# Patient Record
Sex: Male | Born: 1965
Health system: Southern US, Community
[De-identification: ages and names within clinical notes are randomized; demographics above are authoritative.]

## PROBLEM LIST (undated history)

## (undated) DIAGNOSIS — E785 Hyperlipidemia, unspecified: Secondary | ICD-10-CM

## (undated) DIAGNOSIS — K2970 Gastritis, unspecified, without bleeding: Secondary | ICD-10-CM

## (undated) DIAGNOSIS — N289 Disorder of kidney and ureter, unspecified: Secondary | ICD-10-CM

## (undated) DIAGNOSIS — K2211 Ulcer of esophagus with bleeding: Secondary | ICD-10-CM

## (undated) DIAGNOSIS — N1330 Unspecified hydronephrosis: Secondary | ICD-10-CM

## (undated) DIAGNOSIS — L8990 Pressure ulcer of unspecified site, unspecified stage: Secondary | ICD-10-CM

## (undated) DIAGNOSIS — N200 Calculus of kidney: Secondary | ICD-10-CM

## (undated) DIAGNOSIS — Q059 Spina bifida, unspecified: Secondary | ICD-10-CM

## (undated) DIAGNOSIS — K5649 Other impaction of intestine: Secondary | ICD-10-CM

## (undated) HISTORY — PX: APPENDECTOMY: SHX54

## (undated) HISTORY — PX: PERCUTANEOUS NEPHROSTOLITHOTOMY: SHX2207

## (undated) HISTORY — DX: Other impaction of intestine: K56.49

## (undated) HISTORY — PX: COLON SURGERY: SHX602

## (undated) HISTORY — DX: Hyperlipidemia, unspecified: E78.5

## (undated) HISTORY — PX: OTHER SURGICAL HISTORY: SHX169

## (undated) HISTORY — PX: ABDOMINAL SURGERY: SHX537

## (undated) HISTORY — DX: Calculus of kidney: N20.0

## (undated) HISTORY — DX: Unspecified hydronephrosis: N13.30

## (undated) HISTORY — DX: Ulcer of esophagus with bleeding: K22.11

## (undated) HISTORY — PX: BACK SURGERY: SHX140

## (undated) HISTORY — PX: PERCUTANEOUS NEPHROSTOMY: SHX2208

## (undated) HISTORY — DX: Gastritis, unspecified, without bleeding: K29.70

## (undated) HISTORY — PX: VENTRICULOPERITONEAL SHUNT: SHX204

---

## 2008-07-24 ENCOUNTER — Inpatient Hospital Stay: Admission: RE | Admit: 2008-07-24 | Discharge: 2008-08-02 | Payer: Self-pay | Admitting: Internal Medicine

## 2008-08-05 ENCOUNTER — Ambulatory Visit: Payer: Self-pay

## 2008-08-08 ENCOUNTER — Ambulatory Visit: Payer: Self-pay

## 2011-07-06 LAB — DIFFERENTIAL
Basophils Relative: 0
Basophils Relative: 3 — ABNORMAL HIGH
Eosinophils Absolute: 0.4
Eosinophils Absolute: 0.4
Eosinophils Relative: 6 — ABNORMAL HIGH
Eosinophils Relative: 8 — ABNORMAL HIGH
Lymphocytes Relative: 22
Lymphs Abs: 1.3
Lymphs Abs: 1.5
Lymphs Abs: 3.1
Monocytes Absolute: 0.6
Monocytes Relative: 10
Monocytes Relative: 8
Monocytes Relative: 9
Neutro Abs: 2
Neutrophils Relative %: 34 — ABNORMAL LOW
Neutrophils Relative %: 61

## 2011-07-06 LAB — CBC
HCT: 34.7 — ABNORMAL LOW
HCT: 39.4
Hemoglobin: 11.5 — ABNORMAL LOW
MCHC: 32.4
MCHC: 33.1
MCV: 85.6
MCV: 86.7
Platelets: 432 — ABNORMAL HIGH
Platelets: 470 — ABNORMAL HIGH
Platelets: 558 — ABNORMAL HIGH
RBC: 2.65 — ABNORMAL LOW
RBC: 4.06 — ABNORMAL LOW
WBC: 5.8
WBC: 5.8

## 2011-07-06 LAB — IRON AND TIBC
Iron: 26 — ABNORMAL LOW
Saturation Ratios: 16 — ABNORMAL LOW
TIBC: 167 — ABNORMAL LOW

## 2011-07-06 LAB — BASIC METABOLIC PANEL
BUN: 7
CO2: 29
Calcium: 8.4
Chloride: 102
Creatinine, Ser: 0.62
Creatinine, Ser: 0.62
GFR calc Af Amer: >60
Glucose, Bld: 92
Potassium: 4.5

## 2011-07-06 LAB — CROSSMATCH

## 2011-07-06 LAB — ABO/RH: ABO/RH(D): A POS

## 2011-07-06 LAB — OCCULT BLOOD X 1 CARD TO LAB, STOOL: Fecal Occult Bld: POSITIVE

## 2014-06-14 DIAGNOSIS — K5909 Other constipation: Secondary | ICD-10-CM | POA: Insufficient documentation

## 2014-06-29 ENCOUNTER — Emergency Department: Payer: Self-pay | Admitting: Emergency Medicine

## 2014-11-29 DIAGNOSIS — N99528 Other complication of other external stoma of urinary tract: Secondary | ICD-10-CM | POA: Insufficient documentation

## 2014-12-17 ENCOUNTER — Emergency Department: Payer: Self-pay | Admitting: Emergency Medicine

## 2014-12-21 DIAGNOSIS — R7881 Bacteremia: Secondary | ICD-10-CM | POA: Insufficient documentation

## 2014-12-21 DIAGNOSIS — B952 Enterococcus as the cause of diseases classified elsewhere: Secondary | ICD-10-CM | POA: Insufficient documentation

## 2014-12-26 DIAGNOSIS — A0472 Enterocolitis due to Clostridium difficile, not specified as recurrent: Secondary | ICD-10-CM | POA: Insufficient documentation

## 2015-03-11 DIAGNOSIS — N319 Neuromuscular dysfunction of bladder, unspecified: Secondary | ICD-10-CM | POA: Insufficient documentation

## 2015-03-26 ENCOUNTER — Ambulatory Visit: Payer: Self-pay | Admitting: Surgery

## 2015-03-27 ENCOUNTER — Encounter: Payer: Medicare Other | Attending: Surgery | Admitting: Surgery

## 2015-03-27 DIAGNOSIS — L89324 Pressure ulcer of left buttock, stage 4: Secondary | ICD-10-CM | POA: Diagnosis not present

## 2015-03-27 DIAGNOSIS — Q059 Spina bifida, unspecified: Secondary | ICD-10-CM | POA: Insufficient documentation

## 2015-03-27 DIAGNOSIS — G8221 Paraplegia, complete: Secondary | ICD-10-CM | POA: Insufficient documentation

## 2015-03-28 NOTE — Progress Notes (Addendum)
DREQUAN, IRONSIDE ([id number]) Visit Report for 03/27/2015 Chief Complaint Document Details Patient Name: [name], [name] Date of Service: 03/27/2015 1:45 PM Medical Record Number: [id number] Patient Account Number: [id number] Date of Birth/Sex: 1966-01-12 (49 y.o. Male) Treating RN: Primary Care Physician: Other Clinician: Referring Physician: Treating Physician/Extender: Frann Rider in Treatment: 0 Information Obtained from: Patient Chief Complaint Patient is at the clinic for treatment of an open pressure ulcer. he has had this problem for several weeks and he says he has not seen anyone to last week where he went to the [facility] ED. Electronic Signature(s) Signed: 03/27/2015 5:00:54 PM By: Christin Fudge MD, FACS Entered By: Christin Fudge on 03/27/2015 14:51:16 [name] ([id number]) -------------------------------------------------------------------------------- Debridement Details Patient Name: [name] Date of Service: 03/27/2015 1:45 PM Medical Record Number: [id number] Patient Account Number: [id number] Date of Birth/Sex: 05-30-1966 (49 y.o. Male) Treating RN: Primary Care Physician: Other Clinician: Referring Physician: Treating Physician/Extender: Frann Rider in Treatment: 0 Debridement Performed for Wound #1 Left Upper Leg Assessment: Performed By: Physician Pat Patrick., MD Debridement: Debridement Pre-procedure Yes Verification/Time Out Taken: Start Time: 02:35 Pain Control: Lidocaine 5% topical ointment Level: Skin/Subcutaneous Tissue/Muscle Total Area Debrided (L x 4 (cm) x 3 (cm) = 12 (cm) W): Tissue and other Viable, Non-Viable, Fibrin/Slough, Muscle, Skin, Subcutaneous material debrided: Instrument: Forceps, Scissors Bleeding: Minimum Hemostasis Achieved: Pressure End Time: 02:40 Procedural Pain: 0 Post Procedural Pain: 0 Response to Treatment: Procedure was tolerated well Post Debridement Measurements of Total  Wound Length: (cm) 4.5 Stage: Category/Stage IV Width: (cm) 4.5 Depth: (cm) 3 Volume: (cm) 47.713 Electronic Signature(s) Signed: 03/27/2015 5:00:54 PM By: Christin Fudge MD, FACS Entered By: Christin Fudge on 03/27/2015 14:50:17 [name] ([id number]) -------------------------------------------------------------------------------- HPI Details Patient Name: [name] Date of Service: 03/27/2015 1:45 PM Medical Record Number: [id number] Patient Account Number: [id number] Date of Birth/Sex: 03/22/1966 (49 y.o. Male) Treating RN: Primary Care Physician: Other Clinician: Referring Physician: Treating Physician/Extender: Frann Rider in Treatment: 0 History of Present Illness Location: open ulcer on the left gluteal region near his thigh Quality: Patient reports No Pain. Severity: Patient states wound are getting worse. Duration: Patient has had the wound for > 3 months prior to seeking treatment at the wound center Context: The wound appeared gradually over time Modifying Factors: Consults to this date include:doxycycline which was prescribed by the ER physician at [place] Signs and Symptoms: Patient reports having fever. HPI Description: 49 year old male with a history of spina bifida presenting to [place] with a history of a open wound on the left gluteal region near his upper thigh which she's had for several weeks. He was seen in the ED recently at [facility] and was put on doxycycline and was advised some local care. his past medical history is significant for spinal bifid, neurogenic bladder, kidney stones, sacral pressure sores, constipation. Past surgical history significant for partial cystectomy, ileal conduit, VP shunt removal, percutaneous nephro lithotripsy. In the remote past the patient says he's had some treatment for a ulcer on this area and was treated with a skin graft. Electronic Signature(s) Signed: 03/27/2015 5:00:54 PM By:  Christin Fudge MD, FACS Entered By: Christin Fudge on 03/27/2015 14:55:19 [name] ([id number]) -------------------------------------------------------------------------------- Physical Exam Details Patient Name: [name] Date of Service: 03/27/2015 1:45 PM Medical Record Number: [id number] Patient Account Number: [id number] Date of Birth/Sex: 09/02/1966 (49 y.o. Male) Treating RN: Primary Care Physician: Other Clinician: Referring Physician: Treating Physician/Extender: Frann Rider in Treatment: 0 Constitutional . Pulse regular. Respirations normal and  unlabored. Afebrile. . Eyes Nonicteric. Reactive to light. Ears, Nose, Mouth, and Throat Lips, teeth, and gums WNL.Marland Kitchen Moist mucosa without lesions . Neck supple and nontender. No palpable supraclavicular or cervical adenopathy. Normal sized without goiter. Respiratory WNL. No retractions.. Cardiovascular Pedal Pulses WNL. No clubbing, cyanosis or edema. Integumentary (Hair, Skin) He has a large stage IV pressure ulcer on the left gluteal region near his upper thigh and close to his anus. This is down to the muscle and has some slough between the 12 and 3:00 position and some slough off his muscles too.Marland Kitchen No crepitus or fluctuance. No peri-wound warmth or erythema. No masses.Marland Kitchen Psychiatric Judgement and insight Intact.. No evidence of depression, anxiety, or agitation.. Electronic Signature(s) Signed: 03/27/2015 5:00:54 PM By: Christin Fudge MD, FACS Entered By: Christin Fudge on 03/27/2015 14:57:53 [name] ([id number]) -------------------------------------------------------------------------------- Physician Orders Details Patient Name: [name] Date of Service: 03/27/2015 1:45 PM Medical Record Number: [id number] Patient Account Number: [id number] Date of Birth/Sex: [birth year] (49 y.o. Male) Treating RN: Baruch Gouty, RN, BSN, Velva Harman Primary Care Physician: Other Clinician: Referring  Physician: Treating Physician/Extender: Frann Rider in Treatment: 0 Verbal / Phone Orders: Yes Clinician: Afful, RN, BSN, Rita Read Back and Verified: Yes Diagnosis Coding Wound Cleansing Wound #1 Left Upper Leg o Clean wound with Normal Saline. o May Shower, gently pat wound dry prior to applying new dressing. Anesthetic Wound #1 Left Upper Leg o Topical Lidocaine 4% cream applied to wound bed prior to debridement Skin Barriers/Peri-Wound Care Wound #1 Left Upper Leg o Skin Prep Primary Wound Dressing Wound #1 Left Upper Leg o Santyl Ointment Secondary Dressing Wound #1 Left Upper Leg o Dry Gauze o Boardered Foam Dressing Dressing Change Frequency Wound #1 Left Upper Leg o Change dressing every day. Follow-up Appointments Wound #1 Left Upper Leg o Return Appointment in 1 week. Off-Loading Wound #1 Left Upper Leg o Turn and reposition every 2 hours o [place], [place] ([id number]) Home Health Wound #1 Left Upper Leg o [facility] - Please resume home health visit for wound care. Please order wound vac for patient o Home Health Nurse may visit PRN to address patientos wound care needs. o FACE TO FACE ENCOUNTER: MEDICARE and MEDICAID PATIENTS: I certify that this patient is under my care and that I had a face-to-face encounter that meets the physician face-to-face encounter requirements with this patient on this date. The encounter with the patient was in whole or in part for the following MEDICAL CONDITION: (primary reason for [place]) MEDICAL NECESSITY: I certify, that based on my findings, NURSING services are a medically necessary home health service. HOME BOUND STATUS: I certify that my clinical findings support that this patient is homebound (i.e., Due to illness or injury, pt requires aid of supportive devices such as crutches, cane, wheelchairs, walkers, the use of  special transportation or the assistance of another person to leave their place of residence. There is a normal inability to leave the home and doing so requires considerable and taxing effort. Other absences are for medical reasons / religious services and are infrequent or of short duration when for other reasons). o If current dressing causes regression in wound condition, may D/C ordered dressing product/s and apply Normal Saline Moist Dressing daily until next [place] / Other MD appointment. Magazine of regression in wound condition at [phone]. o Please direct any NON-WOUND related issues/requests for orders to patient's Primary Care Physician Medications-please add to medication list.  Wound #1 Left Upper Leg o Santyl Enzymatic Ointment - Apply to wound bed once a day. o Other: - Patient to continue taking ABT prescribed by Cydney Ok MD. Patient Medications Allergies: sulfur, latex, morphine, iv contrast dye Notifications Medication Indication Start End Santyl wound 03/27/2015 DOSE topical 250 unit/gram ointment - ointment topical Electronic Signature(s) Signed: 03/27/2015 5:00:54 PM By: Christin Fudge MD, FACS Signed: 03/27/2015 5:53:15 PM By: Regan Lemming BSN, RN Previous Signature: 03/27/2015 2:49:56 PM Version By: Regan Lemming BSN, RN Entered By: Regan Lemming on 03/27/2015 16:33:11 KAIDE, GAGE ([id number]) -------------------------------------------------------------------------------- Prescription 03/27/2015 Patient Name: [name] Physician: Christin Fudge MD Date of Birth: 1966-07-05 NPI#: [id number] Sex: Jerilynn Mages DEA#: [id number] Phone #: [phone] License #: Patient Address: [place], [place] [address] [facility] [address], [place], [place] [address] [phone] Allergies sulfur latex morphine iv contrast  dye Physician's Orders Santyl Enzymatic Ointment - Apply to wound bed once a day. Signature(s): Date(s): MONTEY, EBEL ([id number]) Prescription 03/27/2015 Patient Name: [name] Physician: Christin Fudge MD Date of Birth: [birth year] NPI#: [id number] Sex: Valeta Harms: [id number] Phone #: [phone] License #: Patient Address: [place], [place] [address] [facility] [address], [place], [place] [address] [phone] Allergies sulfur latex morphine iv contrast dye Medication Medication: Route: Strength: Form: Santyl topical 250 unit/gram ointment Class: TOPICAL/MUCOUS MEMBR./SUBCUT. ENZYMES Dose: Frequency / Time: Indication: ointment topical wound Number of Refills: Number of Units: 1 Generic Substitution: Start Date: End Date: Administered at Substitution Permitted 02/04/5464 Facility: No Note to Pharmacy: Signature(s): Date(s): HOMMER, CUNLIFFE ([id number]) Electronic Signature(s) Signed: 03/27/2015 5:00:54 PM By: Christin Fudge MD, FACS Signed: 03/27/2015 5:53:15 PM By: Regan Lemming BSN, RN Entered By: Regan Lemming on 03/27/2015 16:33:12 [name] ([id number]) --------------------------------------------------------------------------------  Problem List Details Patient Name: [name] Date of Service: 03/27/2015 1:45 PM Medical Record Number: [id number] Patient Account Number: [id number] Date of Birth/Sex: 1966/05/17 (49 y.o. Male) Treating RN: Primary Care Physician: Other Clinician: Referring Physician: Treating Physician/Extender: Frann Rider in Treatment: 0 Active Problems ICD-10 Encounter Code Description Active Date Diagnosis Q05.9 Spina bifida, unspecified 03/27/2015 Yes G82.21 Paraplegia, complete 03/27/2015 Yes L89.324 Pressure ulcer of left buttock, stage 4 03/27/2015 Yes Inactive Problems Resolved  Problems Electronic Signature(s) Signed: 03/27/2015 5:00:54 PM By: Christin Fudge MD, FACS Entered By: Christin Fudge on 03/27/2015 14:48:49 [name] ([id number]) -------------------------------------------------------------------------------- Progress Note Details Patient Name: [name] Date of Service: 03/27/2015 1:45 PM Medical Record Number: [id number] Patient Account Number: [id number] Date of Birth/Sex: 04/06/1966 (49 y.o. Male) Treating RN: Primary Care Physician: Other Clinician: Referring Physician: Treating Physician/Extender: Frann Rider in Treatment: 0 Subjective Chief Complaint Information obtained from Patient Patient is at the clinic for treatment of an open pressure ulcer. he has had this problem for several weeks and he says he has not seen anyone to last week where he went to the [facility] ED. History of Present Illness (HPI) The following HPI elements were documented for the patient's wound: Location: open ulcer on the left gluteal region near his thigh Quality: Patient reports No Pain. Severity: Patient states wound are getting worse. Duration: Patient has had the wound for > 3 months prior to seeking treatment at the wound center Context: The wound appeared gradually over time Modifying Factors: Consults to this date include:doxycycline which was prescribed by the ER physician at [place] Signs and Symptoms: Patient reports having fever. 49 year old  male with a history of spina bifida presenting to [place] with a history of a open wound on the left gluteal region near his upper thigh which she's had for several weeks. He was seen in the ED recently at [facility] and was put on doxycycline and was advised some local care. his past medical history is significant for spinal bifid, neurogenic bladder, kidney stones, sacral pressure sores, constipation. Past surgical history significant for partial cystectomy, ileal conduit, VP shunt  removal, percutaneous nephro lithotripsy. In the remote past the patient says he's had some treatment for a ulcer on this area and was treated with a skin graft. Wound History Patient presents with 1 open wound that has been present for approximately year. Patient has been treating wound in the following manner: dry dressing. The wound has been healed in the past but has re-opened. Laboratory tests have not been performed in the last month. Patient reportedly has tested positive for an antibiotic resistant organism. Patient reportedly has not tested positive for osteomyelitis. Patient reportedly has not had testing performed to evaluate circulation in the legs. Patient History Information obtained from Patient. Allergies sulfur, latex, morphine, iv contrast dye GILLIE, CRISCI ([id number]) Family History Cancer - Paternal Grandparents, Diabetes - Father, Mother, Hypertension - Father, Mother, Lung Disease - Mother, Stroke - Mother, No family history of Heart Disease, Kidney Disease, Seizures, Thyroid Problems, Tuberculosis. Social History Never smoker, Marital Status - Single, Alcohol Use - Never, Drug Use - No History. Medical History Eyes Denies history of Cataracts, Glaucoma, Optic Neuritis Ear/Nose/Mouth/Throat Denies history of Chronic sinus problems/congestion, Middle ear problems Hematologic/Lymphatic Denies history of Anemia, Hemophilia, Human Immunodeficiency Virus, Lymphedema, Sickle Cell Disease Respiratory Denies history of Aspiration, Asthma, Chronic Obstructive Pulmonary Disease (COPD), Pneumothorax, Sleep Apnea, Tuberculosis Cardiovascular Denies history of Angina, Arrhythmia, Congestive Heart Failure, Coronary Artery Disease, Deep Vein Thrombosis, Hypertension, Hypotension, Myocardial Infarction, Peripheral Arterial Disease, Peripheral Venous Disease, Phlebitis, Vasculitis Endocrine Denies history of Type I Diabetes Immunological Denies history of Lupus  Erythematosus, Raynaud s, Scleroderma Integumentary (Skin) Patient has history of History of pressure wounds Oncologic Denies history of Received Chemotherapy, Received Radiation Psychiatric Denies history of Anorexia/bulimia, Confinement Anxiety Medical And Surgical History Notes Musculoskeletal spina bifida Neurologic Spida Bifida Review of Systems (ROS) Constitutional Symptoms (General Health) The patient has no complaints or symptoms. Eyes Complains or has symptoms of Glasses / Contacts. Ear/Nose/Mouth/Throat The patient has no complaints or symptoms. Hematologic/Lymphatic The patient has no complaints or symptoms. Respiratory MISSAEL, FERRARI ([id number]) The patient has no complaints or symptoms. Cardiovascular The patient has no complaints or symptoms. Gastrointestinal needs to take miralax for bowel movement Endocrine The patient has no complaints or symptoms. Genitourinary Complains or has symptoms of Incontinence/dribbling - urostomy. Immunological The patient has no complaints or symptoms. Integumentary (Skin) Complains or has symptoms of Wounds, Breakdown. Musculoskeletal Complains or has symptoms of Muscle Weakness. Neurologic The patient has no complaints or symptoms. Oncologic The patient has no complaints or symptoms. Psychiatric The patient has no complaints or symptoms. medication list: I reviewed her list of his medications which include Colace, Vibramycin, vitamin supplements, oxycodone, GlycoLax, senna,and fleets enema. Objective Constitutional Pulse regular. Respirations normal and unlabored. Afebrile. Vitals Time Taken: 2:00 PM. Eyes Nonicteric. Reactive to light. Ears, Nose, Mouth, and Throat Lips, teeth, and gums WNL.Marland Kitchen Moist mucosa without lesions . Neck supple and nontender. No palpable supraclavicular or cervical adenopathy. Normal sized without goiter. [place], [name] ([id number]) Respiratory WNL. No  retractions.. Cardiovascular Pedal Pulses WNL. No clubbing, cyanosis or  edema. Psychiatric Judgement and insight Intact.. No evidence of depression, anxiety, or agitation.. Integumentary (Hair, Skin) He has a large stage IV pressure ulcer on the left gluteal region near his upper thigh and close to his anus. This is down to the muscle and has some slough between the 12 and 3:00 position and some slough off his muscles too.Marland Kitchen No crepitus or fluctuance. No peri-wound warmth or erythema. No masses.. Wound #1 status is Open. Original cause of wound was Gradually Appeared. The wound is located on the Left,Posterior Upper Leg. The wound measures 4.5cm length x 4.5cm width x 3cm depth; 15.904cm^2 area and 47.713cm^3 volume. The wound is limited to skin breakdown. There is no tunneling or undermining noted. There is a large amount of serosanguineous drainage noted. The wound margin is distinct with the outline attached to the wound base. There is medium (34-66%) granulation within the wound bed. There is a small (1-33%) amount of necrotic tissue within the wound bed including Adherent Slough. The periwound skin appearance exhibited: Moist. The periwound skin appearance did not exhibit: Callus, Crepitus, Excoriation, Fluctuance, Friable, Induration, Localized Edema, Rash, Scarring, Dry/Scaly, Maceration, Atrophie Blanche, Cyanosis, Ecchymosis, Hemosiderin Staining, Mottled, Pallor, Rubor, Erythema. Periwound temperature was noted as No Abnormality. Assessment Active Problems ICD-10 Q05.9 - Spina bifida, unspecified G82.21 - Paraplegia, complete L89.324 - Pressure ulcer of left buttock, stage 4 This gentleman needs a lot of off loading and we have discussed this with him in great detail. We will also look into his mattress and our team will call and find out what type of mattress he has and also the cushion on his wheelchair. [place], [name] ([id number]) he is already on doxycycline and some not  taking any fresh wound cultures. We will use Santyl for some of the area which could not be debrided sharply and apply a dressing today which will consist of Kerlix packing. I have recommended a negative pressure wound vacuum system so as to have growth of some granulation tissue and then he would need a plastic surgery opinion regarding possible muscle flap reconstruction. He prefers to go to [facility] for this. We will monitor him for a while and then when his wound gets cleaner referred him appropriately. He will come back and see me next week. Procedures Wound #1 Wound #1 is a Pressure Ulcer located on the Left Upper Leg . There was a Skin/Subcutaneous Tissue/Muscle Debridement ([id number]) debridement with total area of 12 sq cm performed by Vashaun Osmon, Jackson Latino., MD. with the following instrument(s): Forceps and Scissors to remove Viable and Non-Viable tissue/material including Fibrin/Slough, Muscle, Skin, and Subcutaneous after achieving pain control using Lidocaine 5% topical ointment. A time out was conducted prior to the start of the procedure. A Minimum amount of bleeding was controlled with Pressure. The procedure was tolerated well with a pain level of 0 throughout and a pain level of 0 following the procedure. Post Debridement Measurements: 4.5cm length x 4.5cm width x 3cm depth; 47.713cm^3 volume. Post debridement Stage noted as Category/Stage IV. Plan Wound Cleansing: Wound #1 Left Upper Leg: Clean wound with Normal Saline. May Shower, gently pat wound dry prior to applying new dressing. Anesthetic: Wound #1 Left Upper Leg: Topical Lidocaine 4% cream applied to wound bed prior to debridement Skin Barriers/Peri-Wound Care: Wound #1 Left Upper Leg: Skin Prep Primary Wound Dressing: Wound #1 Left Upper Leg: Santyl Ointment Secondary Dressing: Wound #1 Left Upper Leg: Dry Gauze Boardered Foam Dressing Dressing Change Frequency: Wound #1 Left Upper Leg: [name]  (  [id number]) Change dressing every day. Follow-up Appointments: Wound #1 Left Upper Leg: Return Appointment in 1 week. Off-Loading: Wound #1 Left Upper Leg: Turn and reposition every 2 hours [facility]ress Home Health: Wound #1 Left Upper Leg: [facility] - Please resume home health visit for wound care. Please order wound vac for patient Home Health Nurse may visit PRN to address patient s wound care needs. FACE TO FACE ENCOUNTER: MEDICARE and MEDICAID PATIENTS: I certify that this patient is under my care and that I had a face-to-face encounter that meets the physician face-to-face encounter requirements with this patient on this date. The encounter with the patient was in whole or in part for the following MEDICAL CONDITION: (primary reason for [place]) MEDICAL NECESSITY: I certify, that based on my findings, NURSING services are a medically necessary home health service. HOME BOUND STATUS: I certify that my clinical findings support that this patient is homebound (i.e., Due to illness or injury, pt requires aid of supportive devices such as crutches, cane, wheelchairs, walkers, the use of special transportation or the assistance of another person to leave their place of residence. There is a normal inability to leave the home and doing so requires considerable and taxing effort. Other absences are for medical reasons / religious services and are infrequent or of short duration when for other reasons). If current dressing causes regression in wound condition, may D/C ordered dressing product/s and apply Normal Saline Moist Dressing daily until next [place] / Other MD appointment. [place] of regression in wound condition at [phone]. Please direct any NON-WOUND related issues/requests for orders to patient's Primary Care Physician Medications-please add to medication list.: Wound #1 Left Upper Leg: Santyl Enzymatic  Ointment - Apply to wound bed once a day. Other: - Patient to continue taking ABT prescribed by Cydney Ok MD. The following medication(s) was prescribed: Santyl topical 250 unit/gram ointment ointment topical for wound starting 03/27/2015 This gentleman needs a lot of off loading and we have discussed this with him in great detail. We will also look into his mattress and our team will call and find out what type of mattress he has and also the cushion on his wheelchair. he is already on doxycycline and some not taking any fresh wound cultures. We will use Santyl for some of the area which could not be debrided sharply and apply a dressing today which will consist of Kerlix packing. [place], [name] ([id number]) I have recommended a negative pressure wound vacuum system so as to have growth of some granulation tissue and then he would need a plastic surgery opinion regarding possible muscle flap reconstruction. He prefers to go to [facility] for this. We will monitor him for a while and then when his wound gets cleaner referred him appropriately. He will come back and see me next week. Electronic Signature(s) Signed: 03/28/2015 3:09:49 PM By: Christin Fudge MD, FACS Previous Signature: 03/27/2015 5:00:54 PM Version By: Christin Fudge MD, FACS Entered By: Christin Fudge on 03/28/2015 15:09:49 [name] ([id number]) -------------------------------------------------------------------------------- ROS/PFSH Details Patient Name: [name] Date of Service: 03/27/2015 1:45 PM Medical Record Number: [id number] Patient Account Number: [id number] Date of Birth/Sex: [birth year] (49 y.o. Male) Treating RN: Baruch Gouty, RN, BSN, Velva Harman Primary Care Physician: Other Clinician: Referring Physician: Treating Physician/Extender: Frann Rider in Treatment: 0 Information Obtained From Patient Wound History Do you currently have one or more open woundso Yes How many open wounds do you currently haveo  1 Approximately how  long have you had your woundso year How have you been treating your wound(s) until nowo dry dressing Has your wound(s) ever healed and then re-openedo Yes Have you had any lab work done in the past montho No Have you tested positive for osteomyelitis (bone infection)o No Have you had any tests for circulation on your legso No Eyes Complaints and Symptoms: Positive for: Glasses / Contacts Medical History: Negative for: Cataracts; Glaucoma; Optic Neuritis Genitourinary Complaints and Symptoms: Positive for: Incontinence/dribbling - urostomy Integumentary (Skin) Complaints and Symptoms: Positive for: Wounds; Breakdown Medical History: Positive for: History of pressure wounds Musculoskeletal Complaints and Symptoms: Positive for: Muscle Weakness Medical History: Past Medical History Notes: spina bifida GOTTLIEB, ZUERCHER ([id number]) Constitutional Symptoms (General Health) Complaints and Symptoms: No Complaints or Symptoms Ear/Nose/Mouth/Throat Complaints and Symptoms: No Complaints or Symptoms Medical History: Negative for: Chronic sinus problems/congestion; Middle ear problems Hematologic/Lymphatic Complaints and Symptoms: No Complaints or Symptoms Medical History: Negative for: Anemia; Hemophilia; Human Immunodeficiency Virus; Lymphedema; Sickle Cell Disease Respiratory Complaints and Symptoms: No Complaints or Symptoms Medical History: Negative for: Aspiration; Asthma; Chronic Obstructive Pulmonary Disease (COPD); Pneumothorax; Sleep Apnea; Tuberculosis Cardiovascular Complaints and Symptoms: No Complaints or Symptoms Medical History: Negative for: Angina; Arrhythmia; Congestive Heart Failure; Coronary Artery Disease; Deep Vein Thrombosis; Hypertension; Hypotension; Myocardial Infarction; Peripheral Arterial Disease; Peripheral Venous Disease; Phlebitis; Vasculitis Gastrointestinal Complaints and Symptoms: Review of System Notes: needs to  take miralax for bowel movement Endocrine Complaints and Symptoms: No Complaints or Symptoms Medical History: KINDRED, REIDINGER ([id number]) Negative for: Type I Diabetes Immunological Complaints and Symptoms: No Complaints or Symptoms Medical History: Negative for: Lupus Erythematosus; Raynaudos; Scleroderma Neurologic Complaints and Symptoms: No Complaints or Symptoms Medical History: Past Medical History Notes: Spida Bifida Oncologic Complaints and Symptoms: No Complaints or Symptoms Medical History: Negative for: Received Chemotherapy; Received Radiation Psychiatric Complaints and Symptoms: No Complaints or Symptoms Medical History: Negative for: Anorexia/bulimia; Confinement Anxiety Family and Social History Cancer: Yes - Paternal Grandparents; Diabetes: Yes - Father, Mother; Heart Disease: No; Hypertension: Yes - Father, Mother; Kidney Disease: No; Lung Disease: Yes - Mother; Seizures: No; Stroke: Yes - Mother; Thyroid Problems: No; Tuberculosis: No; Never smoker; Marital Status - Single; Alcohol Use: Never; Drug Use: No History; Financial Concerns: No; Food, Clothing or Shelter Needs: No; Support System Lacking: No; Transportation Concerns: No; Advanced Directives: No; Patient does not want information on Advanced Directives; Living Will: No Physician Affirmation I have reviewed and agree with the above information. Electronic Signature(s) Signed: 03/27/2015 5:00:54 PM By: Christin Fudge MD, FACS Signed: 03/27/2015 5:53:15 PM By: Regan Lemming BSN, RN Entered By: Christin Fudge on 03/27/2015 14:47:28 AMEER, SANDEN ([id number]) CLINE, DRAHEIM ([id number]) -------------------------------------------------------------------------------- SuperBill Details Patient Name: [name] Date of Service: 03/27/2015 Medical Record Number: [id number] Patient Account Number: [id number] Date of Birth/Sex: 09-22-1966 (49 y.o. 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

## 2015-03-28 NOTE — Progress Notes (Signed)
[name], [name] ([id number]) Visit Report for 03/27/2015 Allergy List Details Patient Name: [name], [name] Date of Service: 03/27/2015 1:45 PM Medical Record Number: [id number] Patient Account Number: [id number] Date of Birth/Sex: 1965/11/27 (49 y.o. Male) Treating RN: Baruch Gouty, RN, BSN, Velva Harman Primary Care Physician: Other Clinician: Referring Physician: Treating Physician/Extender: Frann Rider in Treatment: 0 Allergies Active Allergies sulfur latex morphine iv contrast dye Allergy Notes Electronic Signature(s) Signed: 03/27/2015 5:53:15 PM By: Regan Lemming BSN, RN Entered By: Regan Lemming on 03/27/2015 14:02:08 [name] ([id number]) -------------------------------------------------------------------------------- [place] Details Patient Name: [name] Date of Service: 03/27/2015 1:45 PM Medical Record Number: [id number] Patient Account Number: [id number] Date of Birth/Sex: 05-Jan-1966 (49 y.o. Male) Treating RN: Baruch Gouty, RN, BSN, Velva Harman Primary Care Physician: Other Clinician: Referring Physician: Treating Physician/Extender: Frann Rider in Treatment: 0 Visit Information Patient Arrived: Wheel Chair Arrival Time: 14:00 Accompanied By: self Transfer Assistance: Manual Patient Identification Verified: Yes Secondary Verification Process Yes Completed: Patient Requires Transmission-Based No Precautions: Patient Has Alerts: No Electronic Signature(s) Signed: 03/27/2015 5:53:15 PM By: Regan Lemming BSN, RN Entered By: Regan Lemming on 03/27/2015 14:00:33 [name] ([id number]) -------------------------------------------------------------------------------- Clinic Level of Care Assessment Details Patient Name: [name] Date of Service: 03/27/2015 1:45 PM Medical Record Number: [id number] Patient Account Number: [id number] Date of Birth/Sex: [birth year] (49 y.o. Male) Treating RN: Baruch Gouty, RN, BSN, Velva Harman Primary Care  Physician: Other Clinician: Referring Physician: Treating Physician/Extender: Frann Rider in Treatment: 0 Clinic Level of Care Assessment Items TOOL 1 Quantity Score []  - Use when EandM and Procedure is performed on INITIAL visit 0 ASSESSMENTS - Nursing Assessment / Reassessment X - General Physical Exam (combine w/ comprehensive assessment (listed just 1 20 below) when performed on new pt. evals) X - Comprehensive Assessment (HX, ROS, Risk Assessments, Wounds Hx, etc.) 1 25 ASSESSMENTS - Wound and Skin Assessment / Reassessment []  - Dermatologic / Skin Assessment (not related to wound area) 0 ASSESSMENTS - Ostomy and/or Continence Assessment and Care []  - Incontinence Assessment and Management 0 []  - Ostomy Care Assessment and Management (repouching, etc.) 0 PROCESS - Coordination of Care X - Simple Patient / Family Education for ongoing care 1 15 []  - Complex (extensive) Patient / Family Education for ongoing care 0 []  - Staff obtains Programmer, systems, Records, Test Results / Process Orders 0 X - Staff telephones HHA, Nursing Homes / Clarify orders / etc 1 10 []  - Routine Transfer to another Facility (non-emergent condition) 0 []  - Routine Hospital Admission (non-emergent condition) 0 X - New Admissions / Biomedical engineer / Ordering NPWT, Apligraf, etc. 1 15 []  - Emergency Hospital Admission (emergent condition) 0 PROCESS - Special Needs []  - Pediatric / Minor Patient Management 0 []  - Isolation Patient Management 0 [name], [name] ([id number]) []  - Hearing / Language / Visual special needs 0 []  - Assessment of Community assistance (transportation, D/C planning, etc.) 0 []  - Additional assistance / Altered mentation 0 []  - Support Surface(s) Assessment (bed, cushion, seat, etc.) 0 INTERVENTIONS - Miscellaneous []  - External ear exam 0 []  - Patient Transfer (multiple staff / Civil Service fast streamer / Similar devices) 0 []  - Simple Staple / Suture removal (25 or less) 0 []  -  Complex Staple / Suture removal (26 or more) 0 []  - Hypo/Hyperglycemic Management (do not check if billed separately) 0 []  - Ankle / Brachial Index (ABI) - do not check if billed separately 0 Has the patient been seen at the hospital within the last three years: Yes Total Score: 85 Level Of Care: New/Established -  Level 3 Electronic Signature(s) Signed: 03/27/2015 5:53:15 PM By: Regan Lemming BSN, RN Entered By: Regan Lemming on 03/27/2015 14:50:35 [name] ([id number]) -------------------------------------------------------------------------------- Encounter Discharge Information Details Patient Name: [name] Date of Service: 03/27/2015 1:45 PM Medical Record Number: [id number] Patient Account Number: [id number] Date of Birth/Sex: November 29, 1965 (49 y.o. Male) Treating RN: Baruch Gouty, RN, BSN, Velva Harman Primary Care Physician: Other Clinician: Referring Physician: Treating Physician/Extender: Frann Rider in Treatment: 0 Encounter Discharge Information Items Discharge Pain Level: 0 Discharge Condition: Stable Ambulatory Status: Wheelchair Discharge Destination: Home Transportation: Private Auto Accompanied By: self Schedule Follow-up Appointment: No Medication Reconciliation completed and provided to Patient/Care No [name]: Provided on Clinical Summary of Care: 03/27/2015 Form Type Recipient Paper Patient DF Electronic Signature(s) Signed: 03/27/2015 5:53:15 PM By: Regan Lemming BSN, RN Previous Signature: 03/27/2015 2:43:04 PM Version By: Ruthine Dose Entered By: Regan Lemming on 03/27/2015 16:59:56 [name] ([id number]) -------------------------------------------------------------------------------- Lower Extremity Assessment Details Patient Name: [name] Date of Service: 03/27/2015 1:45 PM Medical Record Number: [id number] Patient Account Number: [id number] Date of Birth/Sex: [birth year] (49 y.o. Male) Treating RN: Baruch Gouty, RN, BSN, Velva Harman Primary Care  Physician: Other Clinician: Referring Physician: Treating Physician/Extender: Frann Rider in Treatment: 0 Electronic Signature(s) Signed: 03/27/2015 5:53:15 PM By: Regan Lemming BSN, RN Entered By: Regan Lemming on 03/27/2015 14:01:16 [name] ([id number]) -------------------------------------------------------------------------------- Multi Wound Chart Details Patient Name: [name] Date of Service: 03/27/2015 1:45 PM Medical Record Number: [id number] Patient Account Number: [id number] Date of Birth/Sex: 08/25/1966 (49 y.o. Male) Treating RN: Baruch Gouty, RN, BSN, Administrator, sports Primary Care Physician: Other Clinician: Referring Physician: Treating Physician/Extender: Frann Rider in Treatment: 0 Photos: [1:No Photos] [N/A:N/A] Wound Location: [1:Left Upper Leg] [N/A:N/A] Wounding Event: [1:Gradually Appeared] [N/A:N/A] Primary Etiology: [1:Pressure Ulcer] [N/A:N/A] Comorbid History: [1:History of pressure wounds] [N/A:N/A] Date Acquired: [1:02/05/2015] [N/A:N/A] Weeks of Treatment: [1:0] [N/A:N/A] Wound Status: [1:Open] [N/A:N/A] Measurements L x W x D 4.5x4.5x3 [N/A:N/A] (cm) Area (cm) : [1:15.904] [N/A:N/A] Volume (cm) : [1:47.713] [N/A:N/A] % Reduction in Area: [1:0.00%] [N/A:N/A] % Reduction in Volume: 0.00% [N/A:N/A] Classification: [1:Category/Stage IV] [N/A:N/A] Exudate Amount: [1:Large] [N/A:N/A] Exudate Type: [1:Serosanguineous] [N/A:N/A] Exudate Color: [1:red, brown] [N/A:N/A] Wound Margin: [1:Distinct, outline attached] [N/A:N/A] Granulation Amount: [1:Medium (34-66%)] [N/A:N/A] Necrotic Amount: [1:Small (1-33%)] [N/A:N/A] Exposed Structures: [1:Fascia: No Fat: No Tendon: No Muscle: No Joint: No Bone: No Limited to Skin Breakdown] [N/A:N/A] Epithelialization: [1:None] [N/A:N/A] Periwound Skin Texture: Edema: No [1:Excoriation: No Induration: No Callus: No Crepitus: No Fluctuance: No Friable: No] [N/A:N/A] Rash: No Scarring: No Periwound Skin  Moist: Yes N/A N/A Moisture: Maceration: No Dry/Scaly: No Periwound Skin Color: Atrophie Blanche: No N/A N/A Cyanosis: No Ecchymosis: No Erythema: No Hemosiderin Staining: No Mottled: No Pallor: No Rubor: No Temperature: No Abnormality N/A N/A Tenderness on No N/A N/A Palpation: Wound Preparation: Ulcer Cleansing: N/A N/A Rinsed/Irrigated with Saline Topical Anesthetic Applied: Other: lidocaine 4% Treatment Notes Electronic Signature(s) Signed: 03/27/2015 5:53:15 PM By: Regan Lemming BSN, RN Entered By: Regan Lemming on 03/27/2015 14:45:11 [name] ([id number]) -------------------------------------------------------------------------------- [place] Details Patient Name: [name] Date of Service: 03/27/2015 1:45 PM Medical Record Number: [id number] Patient Account Number: [id number] Date of Birth/Sex: [birth year] (49 y.o. Male) Treating RN: Baruch Gouty, RN, BSN, Velva Harman Primary Care Physician: Other Clinician: Referring Physician: Treating Physician/Extender: Frann Rider in Treatment: 0 Active Inactive Abuse / Safety / Falls / Self Care Management Nursing Diagnoses: Impaired physical mobility Knowledge deficit related to: safety; personal, health (wound), emergency Potential for falls Self care deficit: actual or potential Goals: Patient will remain injury free  Date Initiated: 03/27/2015 Goal Status: Active Patient/caregiver will verbalize/demonstrate measure taken to improve self care Date Initiated: 03/27/2015 Goal Status: Active Patient/caregiver will verbalize/demonstrate measures taken to improve the patient's personal safety Date Initiated: 03/27/2015 Goal Status: Active Patient/caregiver will verbalize/demonstrate measures taken to prevent injury and/or falls Date Initiated: 03/27/2015 Goal Status: Active Patient/caregiver will verbalize/demonstrate understanding of what to do in case of emergency Date Initiated:  03/27/2015 Goal Status: Active Interventions: Assess fall risk on admission and as needed Assess: immobility, friction, shearing, incontinence upon admission and as needed Assess impairment of mobility on admission and as needed per policy Assess self care needs on admission and as needed Patient referred to community resources (specify in notes) Provide education on fall prevention Provide education on personal and home safety Provide education on safe transfers [place] ([id number]) Treatment Activities: Patient referred to home care : 03/27/2015 Notes: Orientation to the Wound Care Program Nursing Diagnoses: Knowledge deficit related to the wound healing center program Goals: Patient/caregiver will verbalize understanding of the [facility] Date Initiated: 03/27/2015 Goal Status: Active Interventions: Provide education on orientation to the wound center Notes: Pressure Nursing Diagnoses: Knowledge deficit related to causes and risk factors for pressure ulcer development Knowledge deficit related to management of pressures ulcers Goals: Patient will remain free from development of additional pressure ulcers Date Initiated: 03/27/2015 Goal Status: Active Patient will remain free of pressure ulcers Date Initiated: 03/27/2015 Goal Status: Active Patient/caregiver will verbalize risk factors for pressure ulcer development Date Initiated: 03/27/2015 Goal Status: Active Patient/caregiver will verbalize understanding of pressure ulcer management Date Initiated: 03/27/2015 Goal Status: Active Interventions: Assess: immobility, friction, shearing, incontinence upon admission and as needed Assess offloading mechanisms upon admission and as needed Assess potential for pressure ulcer upon admission and as needed Provide education on pressure ulcers [name], [name] ([id number]) Treatment Activities: Patient referred for home evaluation of offloading  devices/mattresses : 03/27/2015 Patient referred for pressure reduction/relief devices : 03/27/2015 Pressure reduction/relief device ordered : 03/27/2015 Notes: Wound/Skin Impairment Nursing Diagnoses: Impaired tissue integrity Knowledge deficit related to ulceration/compromised skin integrity Goals: Patient/caregiver will verbalize understanding of skin care regimen Date Initiated: 03/27/2015 Goal Status: Active Ulcer/skin breakdown will have a volume reduction of 30% by week 4 Date Initiated: 03/27/2015 Goal Status: Active Ulcer/skin breakdown will have a volume reduction of 50% by week 8 Date Initiated: 03/27/2015 Goal Status: Active Ulcer/skin breakdown will have a volume reduction of 80% by week 12 Date Initiated: 03/27/2015 Goal Status: Active Ulcer/skin breakdown will heal within 14 weeks Date Initiated: 03/27/2015 Goal Status: Active Interventions: Assess patient/caregiver ability to perform ulcer/skin care regimen upon admission and as needed Assess ulceration(s) every visit Provide education on ulcer and skin care Treatment Activities: Patient referred to home care : 03/27/2015 Skin care regimen initiated : 03/27/2015 Topical wound management initiated : 03/27/2015 Notes: Electronic Signature(s) Signed: 03/27/2015 5:53:15 PM By: Regan Lemming BSN, RN [name], [name] ([id number]) Entered By: Regan Lemming on 03/27/2015 14:44:57 [name] ([id number]) -------------------------------------------------------------------------------- Pain Assessment Details Patient Name: [name] Date of Service: 03/27/2015 1:45 PM Medical Record Number: [id number] Patient Account Number: [id number] Date of Birth/Sex: 05-08-1966 (49 y.o. Male) Treating RN: Baruch Gouty, RN, BSN, Velva Harman Primary Care Physician: Other Clinician: Referring Physician: Treating Physician/Extender: Frann Rider in Treatment: 0 Active Problems Location of Pain Severity and Description of Pain Patient Has  Paino No Site Locations Pain Management and Medication Current Pain Management: Electronic Signature(s) Signed: 03/27/2015 5:53:15 PM By: Regan Lemming BSN, RN Entered By: Regan Lemming on 03/27/2015 14:00:49  [name], [name] ([id number]) -------------------------------------------------------------------------------- Patient/Caregiver Education Details Patient Name: [name] Date of Service: 03/27/2015 1:45 PM Medical Record Number: [id number] Patient Account Number: [id number] Date of Birth/Gender: 05-22-1966 (49 y.o. Male) Treating RN: Baruch Gouty, RN, BSN, Velva Harman Primary Care Physician: Other Clinician: Referring Physician: Treating Physician/Extender: Frann Rider in Treatment: 0 Education Assessment Education Provided To: Patient and Caregiver hh Education Topics Provided Pressure: Methods: Explain/Verbal Responses: State content correctly Safety: Responses: State content correctly Welcome [facility] [place]: Methods: Explain/Verbal Responses: State content correctly Wound/Skin Impairment: Handouts: Other: orders sent to [facility] Methods: Explain/Verbal Responses: State content correctly Electronic Signature(s) Signed: 03/27/2015 5:53:15 PM By: Regan Lemming BSN, RN Entered By: Regan Lemming on 03/27/2015 17:00:54 [name] ([id number]) -------------------------------------------------------------------------------- Wound Assessment Details Patient Name: [name] Date of Service: 03/27/2015 1:45 PM Medical Record Number: [id number] Patient Account Number: [id number] Date of Birth/Sex: 13-Oct-1965 (49 y.o. Male) Treating RN: Baruch Gouty, RN, BSN, [place] Primary Care Physician: Other Clinician: Referring Physician: Treating Physician/Extender: Frann Rider in Treatment: 0 Wound Status Wound Number: 1 Primary Etiology: Pressure Ulcer Wound Location: Left Upper Leg Wound Status: Open Wounding Event: Gradually Appeared Comorbid  History: History of pressure wounds Date Acquired: 02/05/2015 Weeks Of Treatment: 0 Clustered Wound: No Photos Photo Uploaded By: Regan Lemming on 03/27/2015 17:11:54 Wound Measurements Length: (cm) 4.5 Width: (cm) 4.5 Depth: (cm) 3 Area: (cm) 15.904 Volume: (cm) 47.713 % Reduction in Area: 0% % Reduction in Volume: 0% Epithelialization: None Tunneling: No Undermining: No Wound Description Classification: Category/Stage IV Wound Margin: Distinct, outline attached Exudate Amount: Large Exudate Type: Serosanguineous Exudate Color: red, brown Foul Odor After Cleansing: No Wound Bed Granulation Amount: Medium (34-66%) Exposed Structure Necrotic Amount: Small (1-33%) Fascia Exposed: No Necrotic Quality: Adherent Slough Fat Layer Exposed: No Tendon Exposed: No [name], [name] ([id number]) Muscle Exposed: No Joint Exposed: No Bone Exposed: No Limited to Skin Breakdown Periwound Skin Texture Texture Color No Abnormalities Noted: No No Abnormalities Noted: No Callus: No Atrophie Blanche: No Crepitus: No Cyanosis: No Excoriation: No Ecchymosis: No Fluctuance: No Erythema: No Friable: No Hemosiderin Staining: No Induration: No Mottled: No Localized Edema: No Pallor: No Rash: No Rubor: No Scarring: No Temperature / Pain Moisture Temperature: No Abnormality No Abnormalities Noted: No Dry / Scaly: No Maceration: No Moist: Yes Wound Preparation Ulcer Cleansing: Rinsed/Irrigated with Saline Topical Anesthetic Applied: Other: lidocaine 4%, Electronic Signature(s) Signed: 03/27/2015 5:53:15 PM By: Regan Lemming BSN, RN Entered By: Regan Lemming on 03/27/2015 14:18:43 [name] ([id number]) -------------------------------------------------------------------------------- Vitals Details Patient Name: [name] Date of Service: 03/27/2015 1:45 PM Medical Record Number: [id number] Patient Account Number: [id number] Date of Birth/Sex: 1966/02/12 (49 y.o.  Male) Treating RN: Baruch Gouty, RN, BSN, Velva Harman Primary Care Physician: Other Clinician: Referring Physician: Treating Physician/Extender: Frann Rider in Treatment: 0 Vital Signs Time Taken: 14:00 Reference Range: 80 - 120 mg / dl Electronic Signature(s) Signed: 03/27/2015 5:53:15 PM By: Regan Lemming BSN, RN Entered By: Regan Lemming on 03/27/2015 14:01:10

## 2015-03-28 NOTE — Progress Notes (Signed)
Chase, [name] ([id number]) Visit Report for 03/27/2015 Abuse/Suicide Risk Screen Details Patient Name: [name], [name] Date of Service: 03/27/2015 1:45 PM Medical Record Number: [id number] Patient Account Number: [id number] Date of Birth/Sex: 05-Apr-1966 (49 y.o. Male) Treating RN: Baruch Gouty, RN, BSN, Velva Harman Primary Care Physician: Other Clinician: Referring Physician: Treating Physician/Extender: Frann Rider in Treatment: 0 Abuse/Suicide Risk Screen Items Answer ABUSE/SUICIDE RISK SCREEN: Has anyone close to you tried to hurt or harm you recentlyo No Do you feel uncomfortable with anyone in your familyo No Has anyone forced you do things that you didnot want to doo No Do you have any thoughts of harming yourselfo No Patient displays signs or symptoms of abuse and/or neglect. No Electronic Signature(s) Signed: 03/27/2015 5:53:15 PM By: Regan Lemming BSN, RN Entered By: Regan Lemming on 03/27/2015 14:15:28 [name] ([id number]) -------------------------------------------------------------------------------- Activities of Daily Living Details Patient Name: [name] Date of Service: 03/27/2015 1:45 PM Medical Record Number: [id number] Patient Account Number: [id number] Date of Birth/Sex: 1966-01-15 (49 y.o. Male) Treating RN: Baruch Gouty, RN, BSN, Velva Harman Primary Care Physician: Other Clinician: Referring Physician: Treating Physician/Extender: Frann Rider in Treatment: 0 Activities of Daily Living Items Answer Activities of Daily Living (Please select one for each item) Drive Automobile Not Able Take Medications Need Assistance Use Telephone Need Assistance Care for Appearance Need Assistance Use Toilet Need Assistance Bath / Shower Need Assistance Dress Self Need Assistance Feed Self Need Assistance Walk Need Assistance Get In / Out Bed Need Assistance Housework Need Assistance Prepare Meals Need Assistance Handle Money Need Assistance Shop for Self  Need Assistance Electronic Signature(s) Signed: 03/27/2015 5:53:15 PM By: Regan Lemming BSN, RN Entered By: Regan Lemming on 03/27/2015 14:15:55 [name] ([id number]) -------------------------------------------------------------------------------- Education Assessment Details Patient Name: [name] Date of Service: 03/27/2015 1:45 PM Medical Record Number: [id number] Patient Account Number: [id number] Date of Birth/Sex: 10/11/1965 (49 y.o. Male) Treating RN: Baruch Gouty, RN, BSN, Velva Harman Primary Care Physician: Other Clinician: Referring Physician: Treating Physician/Extender: Frann Rider in Treatment: 0 Primary Learner Assessed: Patient Learning Preferences/Education Level/Primary Language Learning Preference: Explanation Highest Education Level: High School Preferred Language: English Cognitive Barrier Assessment/Beliefs Language Barrier: No Physical Barrier Assessment Impaired Vision: Yes Glasses Impaired Hearing: No Decreased Hand dexterity: No Knowledge/Comprehension Assessment Knowledge Level: High Comprehension Level: High Ability to understand written High instructions: Ability to understand verbal High instructions: Motivation Assessment Anxiety Level: Calm Cooperation: Cooperative Education Importance: Acknowledges Need Interest in Health Problems: Asks Questions Perception: Coherent Willingness to Engage in Self- Medium Management Activities: Readiness to Engage in Self- Medium Management Activities: Electronic Signature(s) Signed: 03/27/2015 5:53:15 PM By: Regan Lemming BSN, RN Entered By: Regan Lemming on 03/27/2015 14:16:20 [name] ([id number]) -------------------------------------------------------------------------------- Fall Risk Assessment Details Patient Name: [name] Date of Service: 03/27/2015 1:45 PM Medical Record Number: [id number] Patient Account Number: [id number] Date of Birth/Sex: 06-17-1966 (49 y.o.  Male) Treating RN: Baruch Gouty, RN, BSN, [place] Primary Care Physician: Other Clinician: Referring Physician: Treating Physician/Extender: Frann Rider in Treatment: 0 Fall Risk Assessment Items FALL RISK ASSESSMENT: History of falling - immediate or within 3 months 0 No Secondary diagnosis 0 No Ambulatory aid None/bed rest/wheelchair/nurse 0 Yes Crutches/cane/walker 0 No Furniture 0 No IV Access/Saline Lock 0 No Gait/Training Normal/bed rest/immobile 0 No Weak 10 Yes Impaired 0 No Mental Status Oriented to own ability 0 Yes Electronic Signature(s) Signed: 03/27/2015 5:53:15 PM By: Regan Lemming BSN, RN Entered By: Regan Lemming on 03/27/2015 14:16:36 [name] ([id number]) -------------------------------------------------------------------------------- Foot Assessment Details Patient Name: [name] Date of Service: 03/27/2015 1:45  PM Medical Record Number: [id number] Patient Account Number: [id number] Date of Birth/Sex: 1966/02/24 (49 y.o. Male) Treating RN: Baruch Gouty, RN, BSN, Velva Harman Primary Care Physician: Other Clinician: Referring Physician: Treating Physician/Extender: Frann Rider in Treatment: 0 Foot Assessment Items Site Locations + = Sensation present, - = Sensation absent, C = Callus, U = Ulcer R = Redness, W = Warmth, M = Maceration, PU = Pre-ulcerative lesion F = Fissure, S = Swelling, D = Dryness Assessment Right: Left: Other Deformity: No No Prior Foot Ulcer: No No Prior Amputation: No No Charcot Joint: No No Ambulatory Status: Non-ambulatory Assistance Device: Wheelchair Gait: Engineer, maintenance) Signed: 03/27/2015 5:53:15 PM By: Regan Lemming BSN, RN Entered By: Regan Lemming on 03/27/2015 [place], [place] ([id number]) -------------------------------------------------------------------------------- Nutrition Risk Assessment Details Patient Name: [name] Date of Service: 03/27/2015 1:45 PM Medical Record Number:  [id number] Patient Account Number: [id number] Date of Birth/Sex: 21-Mar-1966 (49 y.o. Male) Treating RN: Baruch Gouty, RN, BSN, Velva Harman Primary Care Physician: Other Clinician: Referring Physician: Treating Physician/Extender: Frann Rider in Treatment: 0 Height (in): Weight (lbs): Body Mass Index (BMI): Nutrition Risk Assessment Items NUTRITION RISK SCREEN: I have an illness or condition that made me change the kind and/or 0 No amount of food I eat I eat fewer than two meals per day 0 No I eat few fruits and vegetables, or milk products 0 No I have three or more drinks of beer, liquor or wine almost every day 0 No I have tooth or mouth problems that make it hard for me to eat 0 No I don't always have enough money to buy the food I need 0 No I eat alone most of the time 0 No I take three or more different prescribed or over-the-counter drugs a 0 No day Without wanting to, I have lost or gained 10 pounds in the last six 0 No months I am not always physically able to shop, cook and/or feed myself 0 No Nutrition Protocols Good Risk Protocol 0 No interventions needed Moderate Risk Protocol Electronic Signature(s) Signed: 03/27/2015 5:53:15 PM By: Regan Lemming BSN, RN Entered By: Regan Lemming on 03/27/2015 14:16:42

## 2015-04-03 ENCOUNTER — Ambulatory Visit: Payer: Medicare Other | Admitting: Surgery

## 2015-04-14 ENCOUNTER — Emergency Department
Admission: EM | Admit: 2015-04-14 | Discharge: 2015-04-14 | Disposition: A | Discharge status: Other Acute Inpatient Hospital | Payer: Medicare Other | Attending: Emergency Medicine | Admitting: Emergency Medicine

## 2015-04-14 ENCOUNTER — Ambulatory Visit: Payer: Medicare Other | Admitting: Surgery

## 2015-04-14 ENCOUNTER — Encounter: Payer: Self-pay | Admitting: Emergency Medicine

## 2015-04-14 DIAGNOSIS — Y832 Surgical operation with anastomosis, bypass or graft as the cause of abnormal reaction of the patient, or of later complication, without mention of misadventure at the time of the procedure: Secondary | ICD-10-CM | POA: Insufficient documentation

## 2015-04-14 DIAGNOSIS — N99528 Other complication of other external stoma of urinary tract: Secondary | ICD-10-CM

## 2015-04-14 DIAGNOSIS — Z91040 Latex allergy status: Secondary | ICD-10-CM | POA: Insufficient documentation

## 2015-04-14 DIAGNOSIS — R339 Retention of urine, unspecified: Secondary | ICD-10-CM | POA: Diagnosis not present

## 2015-04-14 DIAGNOSIS — N99538 Other complication of other stoma of urinary tract: Secondary | ICD-10-CM | POA: Insufficient documentation

## 2015-04-14 HISTORY — DX: Spina bifida, unspecified: Q05.9

## 2015-04-14 HISTORY — DX: Pressure ulcer of unspecified site, unspecified stage: L89.90

## 2015-04-14 LAB — CBC
HEMATOCRIT: 44.1 % (ref 40.0–52.0)
HEMOGLOBIN: 13.9 g/dL (ref 13.0–18.0)
MCH: 26.6 pg (ref 26.0–34.0)
MCHC: 31.5 g/dL — ABNORMAL LOW (ref 32.0–36.0)
MCV: 84.4 fL (ref 80.0–100.0)
Platelets: 527 10*3/uL — ABNORMAL HIGH (ref 150–440)
RBC: 5.22 MIL/uL (ref 4.40–5.90)
RDW: 17.3 % — AB (ref 11.5–14.5)
WBC: 18.3 10*3/uL — AB (ref 3.8–10.6)

## 2015-04-14 LAB — COMPREHENSIVE METABOLIC PANEL
ALK PHOS: 134 U/L — AB (ref 38–126)
ALT: 23 U/L (ref 17–63)
ANION GAP: 13 (ref 5–15)
AST: 35 U/L (ref 15–41)
Albumin: 4 g/dL (ref 3.5–5.0)
BUN: 19 mg/dL (ref 6–20)
CHLORIDE: 98 mmol/L — AB (ref 101–111)
CO2: 28 mmol/L (ref 22–32)
CREATININE: 1.52 mg/dL — AB (ref 0.61–1.24)
Calcium: 9.5 mg/dL (ref 8.9–10.3)
GFR calc Af Amer: >60 mL/min (ref >60)
GFR, EST NON AFRICAN AMERICAN: 53 mL/min — AB (ref >60)
GLUCOSE: 135 mg/dL — AB (ref 65–99)
Potassium: 3.7 mmol/L (ref 3.5–5.1)
Sodium: 139 mmol/L (ref 135–145)
Total Bilirubin: 0.2 mg/dL — ABNORMAL LOW (ref 0.3–1.2)
Total Protein: 8.8 g/dL — ABNORMAL HIGH (ref 6.5–8.1)

## 2015-04-14 MED ORDER — ONDANSETRON HCL 4 MG/2ML IJ SOLN
4.0000 mg | Freq: Once | INTRAMUSCULAR | Status: AC
  Administered 2015-04-14: 4 mg via INTRAVENOUS

## 2015-04-14 MED ORDER — SODIUM CHLORIDE 0.9 % IV BOLUS (SEPSIS)
1000.0000 mL | Freq: Once | INTRAVENOUS | Status: AC
  Administered 2015-04-14: 1000 mL via INTRAVENOUS

## 2015-04-14 MED ORDER — HYDROMORPHONE HCL 1 MG/ML IJ SOLN
1.0000 mg | Freq: Once | INTRAMUSCULAR | Status: AC
  Administered 2015-04-14: 1 mg via INTRAVENOUS

## 2015-04-14 MED ORDER — ONDANSETRON HCL 4 MG/2ML IJ SOLN
INTRAMUSCULAR | Status: AC
  Filled 2015-04-14: qty 2

## 2015-04-14 MED ORDER — HYDROMORPHONE HCL 1 MG/ML IJ SOLN
INTRAMUSCULAR | Status: AC
  Filled 2015-04-14: qty 1

## 2015-04-14 NOTE — ED Notes (Signed)
Pt with spina bifida, pt has ileoconduit, pt states catheter has come out for unknown time. Pt is actively vomiting. Pt states has "i just feel like i swallowed a watermelon." pt with decubitus ulcer to buttocks.

## 2015-04-14 NOTE — ED Notes (Signed)
Pt states nausea gone and pain improved to "about a four". Bed adjusted for comfort. Pt repositioned in bed for comfort.

## 2015-04-14 NOTE — ED Notes (Signed)
Pt states "i don't know how to tell you what number of pain i have now, just a little bit." note will mark 0 for pain scale on discharge paper.

## 2015-04-14 NOTE — ED Provider Notes (Signed)
[facility] Emergency Department Provider Note  ____________________________________________  Time seen: Approximately 106 AM  I have reviewed the triage vital signs and the nursing notes.   HISTORY  Chief Complaint Urinary Retention    HPI [name] is a 49 y.o. male who has a history of spina bifida with an ileal conduit who comes in today because the catheter came out of his stoma. The patient reports that he is unsure exactly what time it came out but he noticed it out later this evening. He reports that the procedure to place the urostomy was done at [facility] a few weeks ago. He reports that there is a plan to do surgery so that they can move the ileal conduit but until then he has this there. The patient also has had some nausea and vomiting today. He was recently at the hospital at [facility] for the same problem. The patient reports that he has not made urine all day. He has some pain in his abdomen and lower back. He reports that the pain is 8-10 out of 10 and it varies during the day. He reports thatwhen he vomits its food or clear liquid. The patient reports he has not drank much today. He was recently at [facility] with sepsis from urinary infection as well as from a decubitus ulcer which is stage IV.   Past Medical History  Diagnosis Date  . Spina bifida   . Decubitus ulcer     There are no active problems to display for this patient.   Past Surgical History  Procedure Laterality Date  . Ilieostomy    . Back surgery    . Ventriculoperitoneal shunt    . Vp shunt removal      No current outpatient prescriptions on file.  Allergies Ivp dye; Latex; Morphine and related; and Sulfa antibiotics  History reviewed. No pertinent family history.  Social History History  Substance Use Topics  . Smoking status: Never Smoker   . Smokeless tobacco: Never Used  . Alcohol Use: No    Review of Systems Constitutional: No fever/chills Eyes: No visual  changes. ENT: No sore throat. Cardiovascular: Denies chest pain. Respiratory: Denies shortness of breath. Gastrointestinal: abdominal pain.  nausea, vomiting.  No diarrhea.  No constipation. Genitourinary: Inability to urinate. Musculoskeletal:  back pain. Skin: Negative for rash. Neurological: Negative for headaches,   10-point ROS otherwise negative.  ____________________________________________   PHYSICAL EXAM:  VITAL SIGNS: ED Triage Vitals  Enc Vitals Group     BP 04/14/15 0043 130/104 mmHg     Pulse Rate 04/14/15 0043 110     Resp 04/14/15 0043 18     Temp 04/14/15 0043 97.8 F (36.6 C)     Temp Source 04/14/15 0043 Axillary     SpO2 04/14/15 0043 98 %     Weight 04/14/15 0043 90 lb (40.824 kg)     Height 04/14/15 0043 4\' 11"  (1.499 m)     Head Cir --      Peak Flow --      Pain Score 04/14/15 0043 8     Pain Loc --      Pain Edu? --      Excl. in [place]? --     Constitutional: Alert and oriented. Well appearing and in moderate distress. Eyes: Conjunctivae are normal. PERRL. EOMI. Head: Atraumatic. Nose: No congestion/rhinnorhea. Mouth/Throat: Mucous membranes are moist.  Oropharynx non-erythematous. Cardiovascular: Tachycardic, regular rhythm. Grossly normal heart sounds.  Good peripheral circulation. Respiratory: Normal respiratory  effort.  No retractions. Lungs CTAB. Gastrointestinal: Soft and diffusely tender to palpation. Mild distention. Positive bowel sounds, ileostomy visualized with no urostomy in place Genitourinary: deferred Musculoskeletal: No lower extremity tenderness nor edema.  No joint effusions. Neurologic:  Normal speech and language. No gross focal neurologic deficits are appreciated.  Skin:  Skin is warm, dry and intact. No rash noted. Psychiatric: Mood and affect are normal.   ____________________________________________   LABS (all labs ordered are listed, but only abnormal results are displayed)  Labs Reviewed  COMPREHENSIVE METABOLIC  PANEL - Abnormal; Notable for the following:    Chloride 98 (*)    Glucose, Bld 135 (*)    Creatinine, Ser 1.52 (*)    Total Protein 8.8 (*)    Alkaline Phosphatase 134 (*)    Total Bilirubin 0.2 (*)    GFR calc non Af Amer 53 (*)    All other components within normal limits  CBC - Abnormal; Notable for the following:    WBC 18.3 (*)    MCHC 31.5 (*)    RDW 17.3 (*)    Platelets 527 (*)    All other components within normal limits   ____________________________________________  EKG  None ____________________________________________  RADIOLOGY  None ____________________________________________   PROCEDURES  Procedure(s) performed: None  Critical Care performed: No  ____________________________________________   INITIAL IMPRESSION / ASSESSMENT AND PLAN / ED COURSE  Pertinent labs & imaging results that were available during my care of the patient were reviewed by me and considered in my medical decision making (see chart for details).  This is a 49 year old male who has a history of spina bifida who comes in today with inability to urinate. The patient has a urostomy with ileal conduit and the tube has fallen out. We will do some blood work and attempt to transfer the patient back to [facility] where he had the procedure done by the ER previously. Give the patient a liter of normal saline as he is tachycardic as well as some Dilaudid for pain.  ----------------------------------------- 6:18 AM on 04/14/2015 -----------------------------------------  After the blood work was received I contacted [facility] and discussed the patient's case with the ED attending Dr. Cletus Gash. Given the patient's complicated history and his previous procedures done at [facility] he was accepted into transfer at [facility]. The patient's rate has improved and his blood pressure is stable. He will need a urine culture and possible antibiotics given his increased white blood cell count. He'll be transferred to [facility] for  replacement of his urostomy tube into his ileal conduit. ____________________________________________   FINAL CLINICAL IMPRESSION(S) / ED DIAGNOSES  Final diagnoses:  Urinary retention  Complication of urostomy      Loney Hering, MD 04/14/15 [phone]

## 2015-04-14 NOTE — ED Notes (Signed)
Pt updated on transfer process. Pt verbalizes understanding.  

## 2015-04-15 DIAGNOSIS — R197 Diarrhea, unspecified: Secondary | ICD-10-CM | POA: Insufficient documentation

## 2015-04-24 ENCOUNTER — Emergency Department: Payer: Medicare Other

## 2015-04-24 ENCOUNTER — Encounter: Payer: Self-pay | Admitting: Emergency Medicine

## 2015-04-24 ENCOUNTER — Emergency Department
Admission: EM | Admit: 2015-04-24 | Discharge: 2015-04-24 | Disposition: A | Discharge status: Other Acute Inpatient Hospital | Payer: Medicare Other | Attending: Emergency Medicine | Admitting: Emergency Medicine

## 2015-04-24 ENCOUNTER — Other Ambulatory Visit: Payer: Self-pay

## 2015-04-24 DIAGNOSIS — Z91040 Latex allergy status: Secondary | ICD-10-CM | POA: Insufficient documentation

## 2015-04-24 DIAGNOSIS — N319 Neuromuscular dysfunction of bladder, unspecified: Secondary | ICD-10-CM

## 2015-04-24 DIAGNOSIS — N179 Acute kidney failure, unspecified: Secondary | ICD-10-CM | POA: Diagnosis not present

## 2015-04-24 DIAGNOSIS — N390 Urinary tract infection, site not specified: Secondary | ICD-10-CM

## 2015-04-24 DIAGNOSIS — E860 Dehydration: Secondary | ICD-10-CM

## 2015-04-24 DIAGNOSIS — K5900 Constipation, unspecified: Secondary | ICD-10-CM | POA: Diagnosis not present

## 2015-04-24 DIAGNOSIS — R109 Unspecified abdominal pain: Secondary | ICD-10-CM | POA: Diagnosis present

## 2015-04-24 HISTORY — DX: Disorder of kidney and ureter, unspecified: N28.9

## 2015-04-24 LAB — COMPREHENSIVE METABOLIC PANEL
ALT: 24 U/L (ref 17–63)
AST: 51 U/L — AB (ref 15–41)
Albumin: 3.9 g/dL (ref 3.5–5.0)
Alkaline Phosphatase: 117 U/L (ref 38–126)
Anion gap: 19 — ABNORMAL HIGH (ref 5–15)
BUN: 38 mg/dL — ABNORMAL HIGH (ref 6–20)
CO2: 21 mmol/L — AB (ref 22–32)
Calcium: 8.9 mg/dL (ref 8.9–10.3)
Chloride: 103 mmol/L (ref 101–111)
Creatinine, Ser: 2.51 mg/dL — ABNORMAL HIGH (ref 0.61–1.24)
GFR, EST AFRICAN AMERICAN: 33 mL/min — AB (ref >60)
GFR, EST NON AFRICAN AMERICAN: 29 mL/min — AB (ref >60)
Glucose, Bld: 113 mg/dL — ABNORMAL HIGH (ref 65–99)
Potassium: 3.7 mmol/L (ref 3.5–5.1)
Sodium: 143 mmol/L (ref 135–145)
TOTAL PROTEIN: 8 g/dL (ref 6.5–8.1)
Total Bilirubin: 0.9 mg/dL (ref 0.3–1.2)

## 2015-04-24 LAB — CBC WITH DIFFERENTIAL/PLATELET
BASOS PCT: 1 %
Basophils Absolute: 0.1 10*3/uL (ref 0–0.1)
EOS ABS: 0 10*3/uL (ref 0–0.7)
Eosinophils Relative: 0 %
HCT: 44.5 % (ref 40.0–52.0)
HEMOGLOBIN: 14.1 g/dL (ref 13.0–18.0)
LYMPHS ABS: 1.4 10*3/uL (ref 1.0–3.6)
Lymphocytes Relative: 8 %
MCH: 26.1 pg (ref 26.0–34.0)
MCHC: 31.6 g/dL — AB (ref 32.0–36.0)
MCV: 82.4 fL (ref 80.0–100.0)
MONO ABS: 0.7 10*3/uL (ref 0.2–1.0)
Monocytes Relative: 4 %
Neutro Abs: 15.9 10*3/uL — ABNORMAL HIGH (ref 1.4–6.5)
Neutrophils Relative %: 87 %
PLATELETS: 456 10*3/uL — AB (ref 150–440)
RBC: 5.41 MIL/uL (ref 4.40–5.90)
RDW: 17.1 % — AB (ref 11.5–14.5)
WBC: 18 10*3/uL — AB (ref 3.8–10.6)

## 2015-04-24 LAB — URINALYSIS COMPLETE WITH MICROSCOPIC (ARMC ONLY)
BILIRUBIN URINE: NEGATIVE
GLUCOSE, UA: NEGATIVE mg/dL
Ketones, ur: NEGATIVE mg/dL
Nitrite: NEGATIVE
PROTEIN: 30 mg/dL — AB
SPECIFIC GRAVITY, URINE: 1.009 (ref 1.005–1.030)
pH: 8 (ref 5.0–8.0)

## 2015-04-24 LAB — LIPASE, BLOOD: Lipase: 16 U/L — ABNORMAL LOW (ref 22–51)

## 2015-04-24 LAB — LACTIC ACID, PLASMA: Lactic Acid, Venous: 2.9 mmol/L (ref 0.5–2.0)

## 2015-04-24 MED ORDER — ONDANSETRON HCL 4 MG/2ML IJ SOLN
4.0000 mg | Freq: Once | INTRAMUSCULAR | Status: AC
  Administered 2015-04-24: 4 mg via INTRAVENOUS
  Filled 2015-04-24: qty 2

## 2015-04-24 MED ORDER — CEFTRIAXONE SODIUM IN DEXTROSE 40 MG/ML IV SOLN: 2.0000 g | Freq: Once | INTRAVENOUS | Status: DC

## 2015-04-24 MED ORDER — SODIUM CHLORIDE 0.9 % IV SOLN
Freq: Once | INTRAVENOUS | Status: AC
  Administered 2015-04-24: 20:00:00 via INTRAVENOUS

## 2015-04-24 MED ORDER — PIPERACILLIN-TAZOBACTAM 3.375 G IVPB
3.3750 g | Freq: Once | INTRAVENOUS | Status: DC
Start: 2015-04-24 — End: 2015-04-25
  Administered 2015-04-24: 3.375 g via INTRAVENOUS
  Filled 2015-04-24: qty 50

## 2015-04-24 MED ORDER — SODIUM CHLORIDE 0.9 % IV SOLN
Freq: Once | INTRAVENOUS | Status: AC
  Administered 2015-04-24: 22:00:00 via INTRAVENOUS

## 2015-04-24 MED ORDER — VANCOMYCIN HCL IN DEXTROSE 1-5 GM/200ML-% IV SOLN
1000.0000 mg | Freq: Once | INTRAVENOUS | Status: AC
  Administered 2015-04-24: 1000 mg via INTRAVENOUS
  Filled 2015-04-24: qty 200

## 2015-04-24 NOTE — ED Notes (Signed)
Surprapubic Foley came out.

## 2015-04-24 NOTE — ED Provider Notes (Addendum)
[facility] Emergency Department Provider Note     Time seen: ----------------------------------------- 7:52 PM on 04/24/2015 -----------------------------------------    I have reviewed the triage vital signs and the nursing notes.   HISTORY  Chief Complaint Abdominal Pain    HPI [name] is a 49 y.o. male who is brought to the ER for pain and to have a CT scan performed. According to reports outpatient x-rays were performed which revealed dilated bowel loops and he is encouraged to come to the ER for CT scanning to rule out obstruction. Patient does not know when his pain started, does not remember when he last had a bowel movement, is not sure of his last urine output. He doesn't history of spina bifida, decubitus ulcer, chronic indwelling Foley catheter. She was recently discharged and [facility] after sepsis.   Past Medical History  Diagnosis Date  . Spina bifida   . Decubitus ulcer     There are no active problems to display for this patient.   Past Surgical History  Procedure Laterality Date  . Ilieostomy    . Back surgery    . Ventriculoperitoneal shunt    . Vp shunt removal      Allergies Ivp dye; Latex; Morphine and related; and Sulfa antibiotics  Social History History  Substance Use Topics  . Smoking status: Never Smoker   . Smokeless tobacco: Never Used  . Alcohol Use: No   Review of Systems Constitutional: Negative for fever. Eyes: Negative for visual changes. ENT: Negative for sore throat. Cardiovascular: Negative for chest pain. Respiratory: Negative for shortness of breath. Gastrointestinal: Positive for abdominal pain, constipation Genitourinary: Negative for dysuria. Musculoskeletal: Negative for back pain. Skin: Negative for rash. Neurological: Negative for headaches, focal weakness or numbness.  10-point ROS otherwise negative.  ____________________________________________   PHYSICAL EXAM:  VITAL  SIGNS: ED Triage Vitals  Enc Vitals Group     BP --      Pulse --      Resp --      Temp --      Temp src --      SpO2 --      Weight --      Height --      Head Cir --      Peak Flow --      Pain Score --      Pain Loc --      Pain Edu? --      Excl. in [place]? --     Constitutional: Alert and oriented. Mild distress Eyes: Conjunctivae are normal.  ENT   Head: Normocephalic and atraumatic.   Nose: No congestion/rhinnorhea.   Mouth/Throat: Mucous membranes are moist. Cardiovascular: Normal rate, regular rhythm. Normal and symmetric distal pulses are present in all extremities. No murmurs, rubs, or gallops. Respiratory: Normal respiratory effort without tachypnea nor retractions. Breath sounds are clear and equal bilaterally. No wheezes/rales/rhonchi. Gastrointestinal: Abdomen is distended, high-pitched bowel sounds. Pain seems centralized, no rebound or guarding. Musculoskeletal:  No lower extremity tenderness nor edema. Neurologic:  Normal speech and language.  Skin:  Skin is warm, dry and intact. No rash noted. Psychiatric:  Speech and behavior are normal.  ____________________________________________  ED COURSE:  Pertinent labs & imaging results that were available during my care of the patient were reviewed by me and considered in my medical decision making (see chart for details). Patient will need abdominal labs, CT abdomen and pelvis to rule out obstruction. ____________________________________________  EKG:  EKG  was sinus tachycardia with a rate of 132 bpm, normal QRS width, long QT interval, RSR pattern of right ventricular conduction delay. Normal axis.  LABS (pertinent positives/negatives)  Labs Reviewed  CBC WITH DIFFERENTIAL/PLATELET - Abnormal; Notable for the following:    WBC 18.0 (*)    MCHC 31.6 (*)    RDW 17.1 (*)    Platelets 456 (*)    Neutro Abs 15.9 (*)    All other components within normal limits  COMPREHENSIVE METABOLIC PANEL -  Abnormal; Notable for the following:    CO2 21 (*)    Glucose, Bld 113 (*)    BUN 38 (*)    Creatinine, Ser 2.51 (*)    AST 51 (*)    GFR calc non Af Amer 29 (*)    GFR calc Af Amer 33 (*)    Anion gap 19 (*)    All other components within normal limits  LIPASE, BLOOD - Abnormal; Notable for the following:    Lipase 16 (*)    All other components within normal limits  URINALYSIS COMPLETEWITH MICROSCOPIC ([facility] ONLY) - Abnormal; Notable for the following:    Color, Urine YELLOW (*)    APPearance CLOUDY (*)    Hgb urine dipstick 2+ (*)    Protein, ur 30 (*)    Leukocytes, UA 3+ (*)    Bacteria, UA FEW (*)    Squamous Epithelial / LPF 0-5 (*)    All other components within normal limits  URINE CULTURE  LACTIC ACID, PLASMA  LACTIC ACID, PLASMA    RADIOLOGY Images were viewed by me  CT abdomen and pelvis IMPRESSION: 1. Again noted chronic congenital spinal dysraphism. Stable dystrophic changes new probable chronic joint effusion left hip joint. 2. Again noted significant distension of the stomach colon and small bowel. Colonic air-fluid levels and distal small bowel air-fluid levels without evidence of transition point in caliber to suggest mechanical obstruction. 3. There is distension of ileal diversion loop in right lower quadrant. Again noted chronic hydronephrosis and ureteral dilatation. Again noted chronic renal calcifications and an bilateral renal cortical thinning. 4. No ascites or free air. 5. There is skin defect and soft tissue defect in left posterior inferior iliac region close proximity with posterior aspect of iliac bone/ischium. This is highly suspicious for decubitus ulcer. There is some sclerosis in adjacent bone. Osteomyelitis cannot be excluded. Stranding of subcutaneous fat in left perineum see axial images 68. Cellulitis or infection cannot be excluded. Clinical correlation is necessary.   CRITICAL CARE Performed by: Earleen Newport   Total critical care time: 30 minutes  Critical care time was exclusive of separately billable procedures and treating other patients.  Critical care was necessary to treat or prevent imminent or life-threatening deterioration.  Critical care was time spent personally by me on the following activities: development of treatment plan with patient and/or surrogate as well as nursing, discussions with consultants, evaluation of patient's response to treatment, examination of patient, obtaining history from patient or surrogate, ordering and performing treatments and interventions, ordering and review of laboratory studies, ordering and review of radiographic studies, pulse oximetry and re-evaluation of patient's condition.  ____________________________________________  FINAL ASSESSMENT AND PLAN  Abdominal pain, urinary obstruction, leukocytosis, acute renal failure due to dehydration  Plan: Patient with labs and imaging as dictated above. I attempted to replace Foley catheter through ileostomy site without success, patient has a urinary obstruction, will need to be transferred back to [facility].  Patient has been accepted  in transfer back to [facility] Dr. Evelena Leyden. I attempted to replace the Foley here without success, this was discussed with the accepting physician at [facility]. He has been given IV antibiotic here as well as 2 L of saline. Currently vital signs are stable.  Earleen Newport, MD   Earleen Newport, MD 04/24/15 [id number]  Earleen Newport, MD 04/24/15 [phone]

## 2015-04-24 NOTE — ED Notes (Signed)
Brought to ED via ems from home for evaluation of Abd pain 3/10. Received A&O*3 speaking full sentences. Complaining of 3/10 abd pain and back pain. Awaiting further evaluation.

## 2015-04-25 NOTE — ED Notes (Signed)
Critical lab lactic acid, reported to Manuela Schwartz, Therapist, sports at [place]. Confirmed and read back.

## 2015-04-27 LAB — URINE CULTURE: Special Requests: NORMAL

## 2016-04-20 ENCOUNTER — Emergency Department: Payer: Medicare Other

## 2016-04-20 ENCOUNTER — Inpatient Hospital Stay
Admission: EM | Admit: 2016-04-20 | Discharge: 2016-04-26 | DRG: 872 | Disposition: A | Discharge status: Home Health | Payer: Medicare Other | Attending: Internal Medicine | Admitting: Internal Medicine

## 2016-04-20 ENCOUNTER — Encounter: Payer: Self-pay | Admitting: *Deleted

## 2016-04-20 DIAGNOSIS — Z681 Body mass index (BMI) 19 or less, adult: Secondary | ICD-10-CM

## 2016-04-20 DIAGNOSIS — Z79899 Other long term (current) drug therapy: Secondary | ICD-10-CM

## 2016-04-20 DIAGNOSIS — E876 Hypokalemia: Secondary | ICD-10-CM | POA: Diagnosis present

## 2016-04-20 DIAGNOSIS — A4101 Sepsis due to Methicillin susceptible Staphylococcus aureus: Principal | ICD-10-CM | POA: Diagnosis present

## 2016-04-20 DIAGNOSIS — A419 Sepsis, unspecified organism: Secondary | ICD-10-CM | POA: Diagnosis present

## 2016-04-20 DIAGNOSIS — L89319 Pressure ulcer of right buttock, unspecified stage: Secondary | ICD-10-CM | POA: Diagnosis present

## 2016-04-20 DIAGNOSIS — G8220 Paraplegia, unspecified: Secondary | ICD-10-CM

## 2016-04-20 DIAGNOSIS — R112 Nausea with vomiting, unspecified: Secondary | ICD-10-CM | POA: Diagnosis not present

## 2016-04-20 DIAGNOSIS — L89151 Pressure ulcer of sacral region, stage 1: Secondary | ICD-10-CM | POA: Diagnosis present

## 2016-04-20 DIAGNOSIS — L89329 Pressure ulcer of left buttock, unspecified stage: Secondary | ICD-10-CM | POA: Diagnosis present

## 2016-04-20 DIAGNOSIS — N99528 Other complication of other external stoma of urinary tract: Secondary | ICD-10-CM

## 2016-04-20 DIAGNOSIS — R636 Underweight: Secondary | ICD-10-CM | POA: Diagnosis present

## 2016-04-20 DIAGNOSIS — Q059 Spina bifida, unspecified: Secondary | ICD-10-CM

## 2016-04-20 DIAGNOSIS — N390 Urinary tract infection, site not specified: Secondary | ICD-10-CM | POA: Diagnosis present

## 2016-04-20 DIAGNOSIS — K5641 Fecal impaction: Secondary | ICD-10-CM | POA: Diagnosis present

## 2016-04-20 DIAGNOSIS — Z7401 Bed confinement status: Secondary | ICD-10-CM

## 2016-04-20 DIAGNOSIS — R066 Hiccough: Secondary | ICD-10-CM | POA: Diagnosis present

## 2016-04-20 LAB — URINALYSIS COMPLETE WITH MICROSCOPIC (ARMC ONLY)
Bilirubin Urine: NEGATIVE
Glucose, UA: NEGATIVE mg/dL
NITRITE: NEGATIVE
Protein, ur: >500 mg/dL — AB
SPECIFIC GRAVITY, URINE: 1.012 (ref 1.005–1.030)
Squamous Epithelial / LPF: NONE SEEN
pH: 7 (ref 5.0–8.0)

## 2016-04-20 LAB — PROTIME-INR
INR: 1
PROTHROMBIN TIME: 13.4 s (ref 11.4–15.0)

## 2016-04-20 LAB — CBC
HCT: 50.9 % (ref 40.0–52.0)
Hemoglobin: 16.9 g/dL (ref 13.0–18.0)
MCH: 28.2 pg (ref 26.0–34.0)
MCHC: 33.1 g/dL (ref 32.0–36.0)
MCV: 85 fL (ref 80.0–100.0)
Platelets: 359 10*3/uL (ref 150–440)
RBC: 5.99 MIL/uL — ABNORMAL HIGH (ref 4.40–5.90)
RDW: 14.7 % — AB (ref 11.5–14.5)
WBC: 20.3 10*3/uL — ABNORMAL HIGH (ref 3.8–10.6)

## 2016-04-20 LAB — COMPREHENSIVE METABOLIC PANEL
ALBUMIN: 4.3 g/dL (ref 3.5–5.0)
ALT: 14 U/L — ABNORMAL LOW (ref 17–63)
ANION GAP: 13 (ref 5–15)
AST: 23 U/L (ref 15–41)
Alkaline Phosphatase: 85 U/L (ref 38–126)
BILIRUBIN TOTAL: 1.1 mg/dL (ref 0.3–1.2)
BUN: 33 mg/dL — ABNORMAL HIGH (ref 6–20)
CO2: 23 mmol/L (ref 22–32)
Calcium: 9.3 mg/dL (ref 8.9–10.3)
Chloride: 104 mmol/L (ref 101–111)
Creatinine, Ser: 1.12 mg/dL (ref 0.61–1.24)
GFR calc Af Amer: >60 mL/min (ref >60)
GFR calc non Af Amer: >60 mL/min (ref >60)
Glucose, Bld: 123 mg/dL — ABNORMAL HIGH (ref 65–99)
Potassium: 2.9 mmol/L — ABNORMAL LOW (ref 3.5–5.1)
Sodium: 140 mmol/L (ref 135–145)
TOTAL PROTEIN: 8.5 g/dL — AB (ref 6.5–8.1)

## 2016-04-20 LAB — LIPASE, BLOOD: Lipase: 19 U/L (ref 11–51)

## 2016-04-20 LAB — LACTIC ACID, PLASMA: Lactic Acid, Venous: 2.2 mmol/L (ref 0.5–1.9)

## 2016-04-20 MED ORDER — ONDANSETRON HCL 4 MG/2ML IJ SOLN
4.0000 mg | Freq: Once | INTRAMUSCULAR | Status: AC
  Administered 2016-04-20: 4 mg via INTRAVENOUS

## 2016-04-20 MED ORDER — SODIUM CHLORIDE 0.9 % IV BOLUS (SEPSIS)
250.0000 mL | Freq: Once | INTRAVENOUS | Status: AC
  Administered 2016-04-20: 250 mL via INTRAVENOUS

## 2016-04-20 MED ORDER — PIPERACILLIN-TAZOBACTAM 3.375 G IVPB 30 MIN
3.3750 g | Freq: Once | INTRAVENOUS | Status: AC
  Administered 2016-04-20: 3.375 g via INTRAVENOUS
  Filled 2016-04-20: qty 50

## 2016-04-20 MED ORDER — VANCOMYCIN HCL IN DEXTROSE 1-5 GM/200ML-% IV SOLN
1000.0000 mg | Freq: Once | INTRAVENOUS | Status: AC
  Administered 2016-04-20: 1000 mg via INTRAVENOUS
  Filled 2016-04-20: qty 200

## 2016-04-20 MED ORDER — SODIUM CHLORIDE 0.9 % IV BOLUS (SEPSIS)
1000.0000 mL | Freq: Once | INTRAVENOUS | Status: AC
  Administered 2016-04-20: 1000 mL via INTRAVENOUS

## 2016-04-20 MED ORDER — BARIUM SULFATE 2.1 % PO SUSP
900.0000 mL | ORAL | Status: AC
  Administered 2016-04-20: 1 mL via ORAL

## 2016-04-20 MED ORDER — ONDANSETRON HCL 4 MG/2ML IJ SOLN
INTRAMUSCULAR | Status: AC
  Administered 2016-04-20: 4 mg via INTRAVENOUS
  Filled 2016-04-20: qty 2

## 2016-04-20 MED ORDER — PENTAFLUOROPROP-TETRAFLUOROETH EX AERO
INHALATION_SPRAY | CUTANEOUS | Status: AC
  Filled 2016-04-20: qty 30

## 2016-04-20 NOTE — ED Provider Notes (Signed)
[facility] Emergency Department Provider Note  ____________________________________________  Time seen: Approximately 9:27 PM  I have reviewed the triage vital signs and the nursing notes.   HISTORY  Chief Complaint Abdominal Pain    HPI [name] is a 50 y.o. male with the history of paraplegia  With a stoma for urinary drainage. He presents by EMS for evaluation of acute onset abdominal pain, nausea, vomiting, and diarrhea that started about 24 hours ago soon after eating dinner. He has not been able to tolerate much of anything to eat or drink over the course of the day. He arrives with tachycardia in the 120s  And a distended and tight abdomen that is tender throughout. He has vomited numerous timesas well as having multiple loose stools. He is noted to have frequent urinary tract infections and his caregiver noted that he has been producing less urine today.  He denies fever/chills, chest pain, shortness of breath. Overall his symptoms are severe and nothing is making them better and they are staying severe over the course of the day today.   Past Medical History  Diagnosis Date  . Spina bifida ([place])   . Decubitus ulcer   . Renal disorder     Patient Active Problem List   Diagnosis Date Noted  . Sepsis ([place]) 04/21/2016    Past Surgical History  Procedure Laterality Date  . Ilieostomy    . Back surgery    . Ventriculoperitoneal shunt    . Vp shunt removal    . Abdominal surgery    . Colon surgery      Current Outpatient Rx  Name  Route  Sig  Dispense  Refill  . docusate sodium (COLACE) 100 MG capsule   Oral   Take 100 mg by mouth 2 (two) times daily as needed for mild constipation.         Marland Kitchen oxyCODONE (OXY IR/ROXICODONE) 5 MG immediate release tablet   Oral   Take 5 mg by mouth every 4 (four) hours as needed for moderate pain or severe pain.           Allergies Ivp dye; Latex; Morphine and related; and Sulfa  antibiotics  History reviewed. No pertinent family history.  Social History Social History  Substance Use Topics  . Smoking status: Never Smoker   . Smokeless tobacco: Never Used  . Alcohol Use: No    Review of Systems Constitutional: No fever/chills Eyes: No visual changes. ENT: No sore throat. Cardiovascular: Denies chest pain. Respiratory: Denies shortness of breath. Gastrointestinal: abd pain w/ N/V/D Genitourinary: has urinary stoma and frequent UTIs Musculoskeletal: no acute pain in his upper extremities Skin: Negative for rash. Neurological: Negative for headaches, focal weakness or numbness.  10-point ROS otherwise negative.  ____________________________________________   PHYSICAL EXAM:  VITAL SIGNS: ED Triage Vitals  Enc Vitals Group     BP 04/20/16 2047 136/90 mmHg     Pulse Rate 04/20/16 2047 125     Resp 04/20/16 2047 20     Temp 04/20/16 2047 98.6 F (37 C)     Temp Source 04/20/16 2047 Oral     SpO2 04/20/16 2047 98 %     Weight 04/20/16 2047 80 lb (36.288 kg)     Height 04/20/16 2047 '4\' 11"'  (1.499 m)     Head Cir --      Peak Flow --      Pain Score 04/20/16 2047 10     Pain Loc --  Pain Edu? --      Excl. in [place]? --     Constitutional: Alert and oriented. Appears chronically ill but is appropriately interactive and responsive with [place] Eyes: Conjunctivae are normal. PERRL. Left eye strabismus (chronic) Head: Atraumatic. Nose: No congestion/rhinnorhea. Mouth/Throat: Mucous membranes are dry. Neck: No stridor.  No meningeal signs.   Cardiovascular: tachycardia, regular rhythm. Good peripheral circulation. Grossly normal heart sounds.   Respiratory: Normal respiratory effort.  No retractions. Lungs CTAB. Gastrointestinal: firm anddistended abdomen throughout with mild tenderness to palpation diffusely Musculoskeletal: No lower extremity tenderness nor edema. No gross deformities of extremities. Neurologic:  Normal speech and language. No  gross focal neurologic deficits are appreciated.  Skin:  Skin is warm, dry and intact. No rash noted.   ____________________________________________   LABS (all labs ordered are listed, but only abnormal results are displayed)  Labs Reviewed  COMPREHENSIVE METABOLIC PANEL - Abnormal; Notable for the following:    Potassium 2.9 (*)    Glucose, Bld 123 (*)    BUN 33 (*)    Total Protein 8.5 (*)    ALT 14 (*)    All other components within normal limits  CBC - Abnormal; Notable for the following:    WBC 20.3 (*)    RBC 5.99 (*)    RDW 14.7 (*)    All other components within normal limits  URINALYSIS COMPLETEWITH MICROSCOPIC ([facility] ONLY) - Abnormal; Notable for the following:    Color, Urine YELLOW (*)    APPearance TURBID (*)    Ketones, ur TRACE (*)    Hgb urine dipstick 2+ (*)    Protein, ur >500 (*)    Leukocytes, UA 3+ (*)    Bacteria, UA MANY (*)    All other components within normal limits  LACTIC ACID, PLASMA - Abnormal; Notable for the following:    Lactic Acid, Venous 2.2 (*)    All other components within normal limits  CULTURE, BLOOD (ROUTINE X 2)  CULTURE, BLOOD (ROUTINE X 2)  URINE CULTURE  LIPASE, BLOOD  LACTIC ACID, PLASMA  PROTIME-INR  MAGNESIUM   ____________________________________________  EKG  ED ECG REPORT I, Tamar Miano, the attending physician, personally viewed and interpreted this ECG.  Date: 04/20/2016 EKG Time: 20:52 Rate: 127 Rhythm: sinus tachycardia QRS Axis: normal Intervals: normal ST/T Wave abnormalities: Non-specific ST segment / T-wave changes, but no evidence of acute ischemia. Conduction Disturbances: none Narrative Interpretation: unremarkable  ____________________________________________  RADIOLOGY   Ct Abdomen Pelvis Wo Contrast  04/21/2016  CLINICAL DATA:  Nausea, vomiting, and abdominal distention. EXAM: CT ABDOMEN AND PELVIS WITHOUT CONTRAST TECHNIQUE: Multidetector CT imaging of the abdomen and pelvis was  performed following the standard protocol without IV contrast. COMPARISON:  04/24/2015 FINDINGS: Lower chest and abdominal wall: Interval healing of left ischial sacral decubitus ulcer, no longer reaching bone. No evidence of abscess. Circumferential thickening of the lower esophagus. Hepatobiliary: No focal liver abnormality.No evidence of biliary obstruction or stone. Pancreas: Unremarkable. Spleen: Unremarkable. Adrenals/Urinary Tract: Negative adrenals. Small and scarred kidneys with layering calcification in the chronically dilated collecting system. Decompressed urostomy in the right lower quadrant. Stomach/Bowel: 9 cm diameter rectal stool ball with proximal gaseous distention of colon. The rectum measures up to 12 cm in diameter. No pneumatosis or perforation is seen. No small bowel obstruction. Appendix not well identified. No secondary signs of appendicitis. Reproductive:Negative Vascular/Lymphatic: No acute vascular abnormality. No mass or adenopathy. Other: No ascites or pneumoperitoneum. Musculoskeletal: Spinal dysraphism, scoliosis/ lumbar non segmentation, and sequela  of contractures and immobility. Dysmorphic left hip with uncovering and chronic hip effusion. IMPRESSION: 1. Rectal impaction. Colonic distention above previous baseline from obstruction. 2. Distal esophageal thickening, likely esophagitis from reflux/vomiting in this setting. 3. Sequela of spina bifida as described above. Electronically Signed   By: Monte Fantasia M.D.   On: 04/21/2016 00:13   Dg Chest Port 1 View  04/20/2016  CLINICAL DATA:  Sepsis and abdominal pain with distention. EXAM: PORTABLE CHEST 1 VIEW COMPARISON:  None. FINDINGS: Low volume chest. The diaphragm is likely elevated by gaseous distention of bowel. Normal heart size and mediastinal contours. Dextroscoliosis. There is no edema, consolidation, effusion, or pneumothorax. Gaseous distention of bowel seen in the upper abdomen. Similar appearance seen 04/24/2015 by  abdominal CT. No visualized pneumoperitoneum. IMPRESSION: 1. Low volume chest without edema or infiltrate. 2. Prominent gaseous distention of bowel in the upper abdomen. 3. Dextroscoliosis. Electronically Signed   By: Monte Fantasia M.D.   On: 04/20/2016 22:11    ____________________________________________   PROCEDURES  Procedure(s) performed:   .Critical Care Performed by: Hinda Kehr Authorized by: Hinda Kehr Total critical care time: 45 minutes Critical care time was exclusive of separately billable procedures and treating other patients. Critical care was necessary to treat or prevent imminent or life-threatening deterioration of the following conditions: sepsis. Critical care was time spent personally by me on the following activities: development of treatment plan with patient or surrogate, discussions with consultants, evaluation of patient's response to treatment, examination of patient, obtaining history from patient or surrogate, ordering and performing treatments and interventions, ordering and review of laboratory studies, ordering and review of radiographic studies, pulse oximetry, re-evaluation of patient's condition and review of old charts.     ____________________________________________   INITIAL IMPRESSION / [place] / ED COURSE  Pertinent labs & imaging results that were available during my care of the patient were reviewed by me and considered in my medical decision making (see chart for details).  9:45 PM Once the CBC came back with a white count of 20 the patient met sepsis criteria based on leukocytosis and tachycardia.  Code sepsis was initiated including empiric antibiotics and 30 mL/kg of normal saline.  He was a very difficult patient to access with IV and he had multiple peripheral attempts before I successfully obtained a 20-gauge in the left EJ.  We are working on a second peripheral as per sepsis protocol but it may not be possible.  As  soon as his metabolic panel has returned we will be able to further evaluate whether he needs a noncontrast CT or further he will be able to tolerate contrast for possible/probable bowel obstruction.  ----------------------------------------- 11:21 PM on 04/20/2016 -----------------------------------------  The patient has an elevated lactate, leukocytosis, hypokalemia, and a grossly infected urine although this is difficult to assess given that he urinates through stoma.  However WBC clumps are present.  Received empiric abx, CT pending.  Anticipate admission for sepsis secondary to UTI, although may need surgery evaluation as well.  Transferred ED care to Dr. Owens Shark to follow up scan results and admit either to hospitalist/ICU or surgery.  ____________________________________________  FINAL CLINICAL IMPRESSION(S) / ED DIAGNOSES  Final diagnoses:  Sepsis, due to unspecified organism [facility])  UTI (lower urinary tract infection)  Paraplegia ([place])  Urinary stoma complication ([place])     MEDICATIONS GIVEN DURING THIS VISIT:  Medications  pentafluoroprop-tetrafluoroeth (GEBAUERS) aerosol (not administered)  Barium Sulfate 2.1 % SUSP 900 mL (1 mL Oral Given 04/20/16  2240)  potassium chloride SA (K-DUR,KLOR-CON) CR tablet 40 mEq (not administered)  sodium chloride 0.9 % bolus 1,000 mL (0 mLs Intravenous Stopped 04/20/16 2300)    And  sodium chloride 0.9 % bolus 250 mL (0 mLs Intravenous Stopped 04/20/16 2300)  piperacillin-tazobactam (ZOSYN) IVPB 3.375 g (0 g Intravenous Stopped 04/20/16 2300)  vancomycin (VANCOCIN) IVPB 1000 mg/200 mL premix (0 mg Intravenous Stopped 04/20/16 2302)  ondansetron (ZOFRAN) injection 4 mg (4 mg Intravenous Given 04/20/16 2235)     NEW OUTPATIENT MEDICATIONS STARTED DURING THIS VISIT:  New Prescriptions   No medications on file      Note:  This document was prepared using Dragon voice recognition software and may include unintentional dictation  errors.   Hinda Kehr, MD 04/21/16 [phone]

## 2016-04-20 NOTE — ED Notes (Signed)
X-ray at bedside at this time.

## 2016-04-20 NOTE — Progress Notes (Signed)
Pharmacy Antibiotic Note  [name] is a 50 y.o. male admitted on 04/20/2016 with sepsis.  Pharmacy has been consulted for Zosyn and vancomycin dosing.  Plan: 1. Zosyn 3.375 gm IV Q8H EI 2. Vancomycin 750 mg IV Q24H, predicted trough 19 mcg/mL. Pharmacy will continue to follow and adjust as needed to maintain trough 15 to 20 mcg/mL.   Vd 25.4 L, Ke 0.039 hr-1, T1/2 17.8 hr  Height: 4\' 11"  (149.9 cm) Weight: 80 lb (36.288 kg) IBW/kg (Calculated) : 47.7  Temp (24hrs), Avg:98.6 F (37 C), Min:98.6 F (37 C), Max:98.6 F (37 C)   Recent Labs Lab 04/20/16 2120  WBC 20.3*  CREATININE 1.12    Estimated Creatinine Clearance: 41.4 mL/min (by C-G formula based on Cr of 1.12).    Allergies  Allergen Reactions  . Ivp Dye [Iodinated Diagnostic Agents] Hives  . Latex   . Morphine And Related   . Sulfa Antibiotics     Thank you for allowing pharmacy to be a part of this patient's care.  Laural Benes, Pharm.D., BCPS Clinical Pharmacist 04/20/2016 9:59 PM

## 2016-04-20 NOTE — ED Notes (Signed)
Pt refusing to drink non con solution, stating "its just going to make me more sick and I just cant handle it" MD Karma Greaser made aware and verbalized okay to call CT and let them know.

## 2016-04-20 NOTE — ED Notes (Addendum)
Pt to ED via EMS from home with abd pain, n/v/d that began last night after eating. Pt paraplegic, with stoma in place for urinary drainage, frequent UTI's. Pt AAOx4, tachy in 120's, abd distended/tight and tender in all quads at this time. Pt states unable to keep anything down since last night. Pt soiled upon arrival, cleaned up, given new brief and change of sheets. Pt tolerated well.

## 2016-04-21 DIAGNOSIS — Z79899 Other long term (current) drug therapy: Secondary | ICD-10-CM | POA: Diagnosis not present

## 2016-04-21 DIAGNOSIS — L89329 Pressure ulcer of left buttock, unspecified stage: Secondary | ICD-10-CM | POA: Diagnosis present

## 2016-04-21 DIAGNOSIS — E876 Hypokalemia: Secondary | ICD-10-CM | POA: Diagnosis present

## 2016-04-21 DIAGNOSIS — R636 Underweight: Secondary | ICD-10-CM | POA: Diagnosis present

## 2016-04-21 DIAGNOSIS — A419 Sepsis, unspecified organism: Secondary | ICD-10-CM

## 2016-04-21 DIAGNOSIS — K5641 Fecal impaction: Secondary | ICD-10-CM | POA: Diagnosis present

## 2016-04-21 DIAGNOSIS — A4101 Sepsis due to Methicillin susceptible Staphylococcus aureus: Secondary | ICD-10-CM | POA: Diagnosis present

## 2016-04-21 DIAGNOSIS — G8220 Paraplegia, unspecified: Secondary | ICD-10-CM | POA: Diagnosis present

## 2016-04-21 DIAGNOSIS — L89151 Pressure ulcer of sacral region, stage 1: Secondary | ICD-10-CM | POA: Diagnosis present

## 2016-04-21 DIAGNOSIS — L89319 Pressure ulcer of right buttock, unspecified stage: Secondary | ICD-10-CM | POA: Diagnosis present

## 2016-04-21 DIAGNOSIS — R112 Nausea with vomiting, unspecified: Secondary | ICD-10-CM | POA: Diagnosis present

## 2016-04-21 DIAGNOSIS — R7881 Bacteremia: Secondary | ICD-10-CM | POA: Diagnosis not present

## 2016-04-21 DIAGNOSIS — Z7401 Bed confinement status: Secondary | ICD-10-CM | POA: Diagnosis not present

## 2016-04-21 DIAGNOSIS — Q059 Spina bifida, unspecified: Secondary | ICD-10-CM | POA: Diagnosis not present

## 2016-04-21 DIAGNOSIS — R066 Hiccough: Secondary | ICD-10-CM | POA: Diagnosis present

## 2016-04-21 DIAGNOSIS — N390 Urinary tract infection, site not specified: Secondary | ICD-10-CM | POA: Diagnosis present

## 2016-04-21 DIAGNOSIS — Z681 Body mass index (BMI) 19 or less, adult: Secondary | ICD-10-CM | POA: Diagnosis not present

## 2016-04-21 HISTORY — DX: Sepsis, unspecified organism: A41.9

## 2016-04-21 LAB — BLOOD CULTURE ID PANEL (REFLEXED)
Acinetobacter baumannii: NOT DETECTED
CANDIDA ALBICANS: NOT DETECTED
CANDIDA KRUSEI: NOT DETECTED
CANDIDA PARAPSILOSIS: NOT DETECTED
CANDIDA TROPICALIS: NOT DETECTED
CARBAPENEM RESISTANCE: NOT DETECTED
Candida glabrata: NOT DETECTED
ENTEROBACTERIACEAE SPECIES: NOT DETECTED
Enterobacter cloacae complex: NOT DETECTED
Enterococcus species: NOT DETECTED
Escherichia coli: NOT DETECTED
Haemophilus influenzae: NOT DETECTED
KLEBSIELLA OXYTOCA: NOT DETECTED
KLEBSIELLA PNEUMONIAE: NOT DETECTED
Listeria monocytogenes: NOT DETECTED
Methicillin resistance: DETECTED — AB
Neisseria meningitidis: NOT DETECTED
PROTEUS SPECIES: NOT DETECTED
PSEUDOMONAS AERUGINOSA: NOT DETECTED
STREPTOCOCCUS AGALACTIAE: NOT DETECTED
STREPTOCOCCUS PNEUMONIAE: NOT DETECTED
Serratia marcescens: NOT DETECTED
Staphylococcus aureus (BCID): NOT DETECTED
Staphylococcus species: DETECTED — AB
Streptococcus pyogenes: NOT DETECTED
Streptococcus species: NOT DETECTED
Vancomycin resistance: NOT DETECTED

## 2016-04-21 LAB — HEMOGLOBIN A1C: Hgb A1c MFr Bld: 5.6 % (ref 4.0–6.0)

## 2016-04-21 LAB — LACTIC ACID, PLASMA: Lactic Acid, Venous: 1 mmol/L (ref 0.5–1.9)

## 2016-04-21 LAB — MAGNESIUM
MAGNESIUM: 2.2 mg/dL (ref 1.7–2.4)
Magnesium: 2.4 mg/dL (ref 1.7–2.4)

## 2016-04-21 LAB — TSH: TSH: 0.587 u[IU]/mL (ref 0.350–4.500)

## 2016-04-21 MED ORDER — PIPERACILLIN-TAZOBACTAM 3.375 G IVPB
3.3750 g | Freq: Three times a day (TID) | INTRAVENOUS | Status: DC
  Administered 2016-04-21 – 2016-04-23 (×7): 3.375 g via INTRAVENOUS
  Filled 2016-04-21 (×10): qty 50

## 2016-04-21 MED ORDER — ONDANSETRON HCL 4 MG/2ML IJ SOLN
4.0000 mg | Freq: Four times a day (QID) | INTRAMUSCULAR | Status: DC | PRN
  Administered 2016-04-21 – 2016-04-22 (×2): 4 mg via INTRAVENOUS
  Filled 2016-04-21 (×2): qty 2

## 2016-04-21 MED ORDER — ONDANSETRON HCL 4 MG PO TABS: 4.0000 mg | ORAL_TABLET | Freq: Four times a day (QID) | ORAL | Status: DC | PRN

## 2016-04-21 MED ORDER — POTASSIUM CHLORIDE CRYS ER 20 MEQ PO TBCR
20.0000 meq | EXTENDED_RELEASE_TABLET | Freq: Two times a day (BID) | ORAL | Status: DC
  Administered 2016-04-21 – 2016-04-22 (×3): 20 meq via ORAL
  Filled 2016-04-21 (×3): qty 1

## 2016-04-21 MED ORDER — OXYCODONE HCL 5 MG PO TABS
5.0000 mg | ORAL_TABLET | ORAL | Status: DC | PRN
  Administered 2016-04-21: 5 mg via ORAL
  Filled 2016-04-21 (×2): qty 1

## 2016-04-21 MED ORDER — ENOXAPARIN SODIUM 30 MG/0.3ML ~~LOC~~ SOLN
30.0000 mg | SUBCUTANEOUS | Status: DC
  Administered 2016-04-23: 30 mg via SUBCUTANEOUS
  Filled 2016-04-21 (×4): qty 0.3

## 2016-04-21 MED ORDER — ENOXAPARIN SODIUM 40 MG/0.4ML ~~LOC~~ SOLN: 40.0000 mg | SUBCUTANEOUS | Status: DC

## 2016-04-21 MED ORDER — HEPARIN SODIUM (PORCINE) 5000 UNIT/ML IJ SOLN: 5000.0000 [IU] | Freq: Two times a day (BID) | INTRAMUSCULAR | Status: DC

## 2016-04-21 MED ORDER — POTASSIUM CHLORIDE CRYS ER 20 MEQ PO TBCR
40.0000 meq | EXTENDED_RELEASE_TABLET | Freq: Once | ORAL | Status: AC
  Administered 2016-04-21: 40 meq via ORAL
  Filled 2016-04-21: qty 2

## 2016-04-21 MED ORDER — VANCOMYCIN HCL IN DEXTROSE 750-5 MG/150ML-% IV SOLN
750.0000 mg | INTRAVENOUS | Status: DC
  Filled 2016-04-21: qty 150

## 2016-04-21 MED ORDER — METOCLOPRAMIDE HCL 5 MG/ML IJ SOLN
10.0000 mg | INTRAMUSCULAR | Status: DC | PRN
  Administered 2016-04-21 – 2016-04-24 (×7): 10 mg via INTRAVENOUS
  Filled 2016-04-21 (×8): qty 2

## 2016-04-21 MED ORDER — ACETAMINOPHEN 650 MG RE SUPP: 650.0000 mg | Freq: Four times a day (QID) | RECTAL | Status: DC | PRN

## 2016-04-21 MED ORDER — SODIUM CHLORIDE 0.9 % IV SOLN
500.0000 mg | INTRAVENOUS | Status: DC
  Administered 2016-04-21: 20:00:00 500 mg via INTRAVENOUS
  Filled 2016-04-21 (×2): qty 500

## 2016-04-21 MED ORDER — ZOLPIDEM TARTRATE 5 MG PO TABS
5.0000 mg | ORAL_TABLET | Freq: Every evening | ORAL | Status: DC | PRN
  Administered 2016-04-21 – 2016-04-25 (×6): 5 mg via ORAL
  Filled 2016-04-21 (×6): qty 1

## 2016-04-21 MED ORDER — ACETAMINOPHEN 325 MG PO TABS: 650.0000 mg | ORAL_TABLET | Freq: Four times a day (QID) | ORAL | Status: DC | PRN

## 2016-04-21 MED ORDER — DOCUSATE SODIUM 100 MG PO CAPS
100.0000 mg | ORAL_CAPSULE | Freq: Two times a day (BID) | ORAL | Status: DC | PRN
  Administered 2016-04-24 – 2016-04-26 (×3): 100 mg via ORAL
  Filled 2016-04-21 (×3): qty 1

## 2016-04-21 MED ORDER — POTASSIUM CHLORIDE IN NACL 40-0.9 MEQ/L-% IV SOLN
INTRAVENOUS | Status: DC
  Administered 2016-04-21 – 2016-04-22 (×2): 75 mL/h via INTRAVENOUS
  Filled 2016-04-21 (×4): qty 1000

## 2016-04-21 NOTE — H&P (Addendum)
[name] is an 50 y.o. male.   Chief Complaint: Nausea and vomiting HPI: The patient with past medical history significant for paraplegia secondary to spina bifida presents to the emergency department complaining of nausea and vomiting since yesterday. He attributes his symptoms to some bad barbecue that he ate yesterday.  However urinalysis revealed urinary tract infection. He has had some diarrhea but this is a recurrent problem. The patient also admits to decreased appetite today. Though the patient is stable he meets criteria for sepsis which prompted the emergency department to call for admission.  Past Medical History  Diagnosis Date  . Spina bifida ([place])   . Decubitus ulcer   . Renal disorder     Past Surgical History  Procedure Laterality Date  . Ilieostomy    . Back surgery    . Ventriculoperitoneal shunt    . Vp shunt removal    . Abdominal surgery    . Colon surgery      Family History  Problem Relation Age of Onset  . Diabetes Mellitus II Father    Social History:  reports that he has never smoked. He has never used smokeless tobacco. He reports that he does not drink alcohol or use illicit drugs.  Allergies:  Allergies  Allergen Reactions  . Ivp Dye [Iodinated Diagnostic Agents] Hives  . Latex   . Morphine And Related Nausea And Vomiting  . Sulfa Antibiotics     Prior to Admission medications   Medication Sig Start Date End Date Taking? Authorizing Provider  docusate sodium (COLACE) 100 MG capsule Take 100 mg by mouth 2 (two) times daily as needed for mild constipation.   Yes Historical Provider, MD  oxyCODONE (OXY IR/ROXICODONE) 5 MG immediate release tablet Take 5 mg by mouth every 4 (four) hours as needed for moderate pain or severe pain.   Yes Historical Provider, MD     Results for orders placed or performed during the hospital encounter of 04/20/16 (from the past 48 hour(s))  Lipase, blood     Status: None   Collection Time: 04/20/16  9:20 PM   Result Value Ref Range   Lipase 19 11 - 51 U/L  Comprehensive metabolic panel     Status: Abnormal   Collection Time: 04/20/16  9:20 PM  Result Value Ref Range   Sodium 140 135 - 145 mmol/L   Potassium 2.9 (L) 3.5 - 5.1 mmol/L   Chloride 104 101 - 111 mmol/L   CO2 23 22 - 32 mmol/L   Glucose, Bld 123 (H) 65 - 99 mg/dL   BUN 33 (H) 6 - 20 mg/dL   Creatinine, Ser 1.12 0.61 - 1.24 mg/dL   Calcium 9.3 8.9 - 10.3 mg/dL   Total Protein 8.5 (H) 6.5 - 8.1 g/dL   Albumin 4.3 3.5 - 5.0 g/dL   AST 23 15 - 41 U/L   ALT 14 (L) 17 - 63 U/L   Alkaline Phosphatase 85 38 - 126 U/L   Total Bilirubin 1.1 0.3 - 1.2 mg/dL   GFR calc non Af Amer >60 >60 mL/min   GFR calc Af Amer >60 >60 mL/min    Comment: (NOTE) The eGFR has been calculated using the CKD EPI equation. This calculation has not been validated in all clinical situations. eGFR's persistently <60 mL/min signify possible Chronic Kidney Disease.    Anion gap 13 5 - 15  CBC     Status: Abnormal   Collection Time: 04/20/16  9:20 PM  Result Value  Ref Range   WBC 20.3 (H) 3.8 - 10.6 K/uL   RBC 5.99 (H) 4.40 - 5.90 MIL/uL   Hemoglobin 16.9 13.0 - 18.0 g/dL   HCT 50.9 40.0 - 52.0 %   MCV 85.0 80.0 - 100.0 fL   MCH 28.2 26.0 - 34.0 pg   MCHC 33.1 32.0 - 36.0 g/dL   RDW 14.7 (H) 11.5 - 14.5 %   Platelets 359 150 - 440 K/uL  Urinalysis complete, with microscopic     Status: Abnormal   Collection Time: 04/20/16  9:20 PM  Result Value Ref Range   Color, Urine YELLOW (A) YELLOW   APPearance TURBID (A) CLEAR   Glucose, UA NEGATIVE NEGATIVE mg/dL   Bilirubin Urine NEGATIVE NEGATIVE   Ketones, ur TRACE (A) NEGATIVE mg/dL   Specific Gravity, Urine 1.012 1.005 - 1.030   Hgb urine dipstick 2+ (A) NEGATIVE   pH 7.0 5.0 - 8.0   Protein, ur >500 (A) NEGATIVE mg/dL   Nitrite NEGATIVE NEGATIVE   Leukocytes, UA 3+ (A) NEGATIVE   RBC / HPF TOO NUMEROUS TO COUNT 0 - 5 RBC/hpf   WBC, UA TOO NUMEROUS TO COUNT 0 - 5 WBC/hpf   Bacteria, UA MANY (A)  NONE SEEN   Squamous Epithelial / LPF NONE SEEN NONE SEEN   WBC Clumps PRESENT   Protime-INR     Status: None   Collection Time: 04/20/16  9:20 PM  Result Value Ref Range   Prothrombin Time 13.4 11.4 - 15.0 seconds   INR 1.00   Lactic acid, plasma     Status: Abnormal   Collection Time: 04/20/16  9:50 PM  Result Value Ref Range   Lactic Acid, Venous 2.2 (HH) 0.5 - 1.9 mmol/L    Comment: CRITICAL RESULT CALLED /TO, READ BACK BY AND VERIFIED WITH LAURYN TLOUDEN @ 2247 ON 04/20/2016 BY CAF   Lactic acid, plasma     Status: None   Collection Time: 04/21/16 12:55 AM  Result Value Ref Range   Lactic Acid, Venous 1.0 0.5 - 1.9 mmol/L   Ct Abdomen Pelvis Wo Contrast  04/21/2016  CLINICAL DATA:  Nausea, vomiting, and abdominal distention. EXAM: CT ABDOMEN AND PELVIS WITHOUT CONTRAST TECHNIQUE: Multidetector CT imaging of the abdomen and pelvis was performed following the standard protocol without IV contrast. COMPARISON:  04/24/2015 FINDINGS: Lower chest and abdominal wall: Interval healing of left ischial sacral decubitus ulcer, no longer reaching bone. No evidence of abscess. Circumferential thickening of the lower esophagus. Hepatobiliary: No focal liver abnormality.No evidence of biliary obstruction or stone. Pancreas: Unremarkable. Spleen: Unremarkable. Adrenals/Urinary Tract: Negative adrenals. Small and scarred kidneys with layering calcification in the chronically dilated collecting system. Decompressed urostomy in the right lower quadrant. Stomach/Bowel: 9 cm diameter rectal stool ball with proximal gaseous distention of colon. The rectum measures up to 12 cm in diameter. No pneumatosis or perforation is seen. No small bowel obstruction. Appendix not well identified. No secondary signs of appendicitis. Reproductive:Negative Vascular/Lymphatic: No acute vascular abnormality. No mass or adenopathy. Other: No ascites or pneumoperitoneum. Musculoskeletal: Spinal dysraphism, scoliosis/ lumbar non  segmentation, and sequela of contractures and immobility. Dysmorphic left hip with uncovering and chronic hip effusion. IMPRESSION: 1. Rectal impaction. Colonic distention above previous baseline from obstruction. 2. Distal esophageal thickening, likely esophagitis from reflux/vomiting in this setting. 3. Sequela of spina bifida as described above. Electronically Signed   By: Monte Fantasia M.D.   On: 04/21/2016 00:13   Dg Chest Port 1 View  04/20/2016  CLINICAL  DATA:  Sepsis and abdominal pain with distention. EXAM: PORTABLE CHEST 1 VIEW COMPARISON:  None. FINDINGS: Low volume chest. The diaphragm is likely elevated by gaseous distention of bowel. Normal heart size and mediastinal contours. Dextroscoliosis. There is no edema, consolidation, effusion, or pneumothorax. Gaseous distention of bowel seen in the upper abdomen. Similar appearance seen 04/24/2015 by abdominal CT. No visualized pneumoperitoneum. IMPRESSION: 1. Low volume chest without edema or infiltrate. 2. Prominent gaseous distention of bowel in the upper abdomen. 3. Dextroscoliosis. Electronically Signed   By: Monte Fantasia M.D.   On: 04/20/2016 22:11    Review of Systems  Constitutional: Negative for fever and chills.  HENT: Negative for sore throat and tinnitus.   Eyes: Negative for blurred vision and redness.  Respiratory: Negative for cough and shortness of breath.   Cardiovascular: Negative for chest pain, palpitations, orthopnea and PND.  Gastrointestinal: Positive for nausea, vomiting and diarrhea. Negative for abdominal pain.  Genitourinary: Negative for dysuria, urgency and frequency.  Musculoskeletal: Negative for myalgias and joint pain.  Skin: Negative for rash.       No lesions  Neurological: Negative for speech change, focal weakness and weakness.  Endo/Heme/Allergies: Does not bruise/bleed easily.       No temperature intolerance  Psychiatric/Behavioral: Negative for depression and suicidal ideas.    Blood  pressure 117/87, pulse 94, temperature 98.6 F (37 C), temperature source Oral, resp. rate 18, height _0  (1.499 m), weight 36.288 kg (80 lb), SpO2 98 %. Physical Exam  Nursing note and vitals reviewed. Constitutional: He is oriented to person, place, and time. He appears well-developed and well-nourished.  HENT:  Head: Normocephalic and atraumatic.  Mouth/Throat: Oropharynx is clear and moist.  Eyes: Conjunctivae and EOM are normal. Pupils are equal, round, and reactive to light. No scleral icterus.  Neck: Normal range of motion. Neck supple. No JVD present. No tracheal deviation present. No thyromegaly present.  Cardiovascular: Regular rhythm and normal heart sounds.  Tachycardia present.  Exam reveals no gallop and no friction rub.   No murmur heard. Respiratory: Effort normal and breath sounds normal. No respiratory distress.  GI: Soft. Bowel sounds are normal. He exhibits distension. He exhibits no mass. There is no tenderness. There is no rebound and no guarding.  Genitourinary:  Deferred  Musculoskeletal:  Paraplegic  Lymphadenopathy:    He has no cervical adenopathy.  Neurological: He is alert and oriented to person, place, and time. 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  Skin: Skin is warm and dry. No rash noted. No erythema.  Psychiatric: He has a normal mood and affect. His behavior is normal. Judgment and thought content normal.     Assessment/Plan This is a 50 year old male admitted for sepsis. 1. Sepsis: The patient meets criteria via leukocytosis and tachycardia. He is hemodynamically stable. He is currently on broad-spectrum antibiotics and we will follow blood as well as urine cultures for growth and sensitivities. 2. Urinary tract infection: Present on admission. Complication of paraplegia and intermittent diarrhea. Continue Vanco and Zosyn for now. 3. Paraplegia: Secondary to spina bifida. 4. Sacral decubitus ulcer: Wound consult ordered 5. DVT  prophylaxis: Heparin 6. GI prophylaxis: None The patient is a full code. Time spent on admission orders and patient care approximately 45 minutes  Harrie Foreman, MD 04/21/2016, 1:31 AM

## 2016-04-21 NOTE — Progress Notes (Signed)
Assessment & Plan  50 year old male with past medical history of spina bifida, decubitus ulcers, who presents to the hospital due to weakness, nausea, vomiting and diarrhea and noted to have a urinary tract infection.  1. Urinary tract infection-continue IV Zosyn, DC vancomycin.  2. Sepsis-patient met criteria given his leukocytosis, fever and tachycardia. -Secondary to UTI. Continue IV Zosyn, DC IV vancomycin. Follow clinically. -Continue IV fluids  3. Diarrhea-seems chronic for the patient. -No acute infectious pathology noted. CT scan of the abdomen and pelvis showing fecal impaction but no evidence of obstruction.  4. Hypokalemia-we'll replace potassium accordingly. Check magnesium level.

## 2016-04-21 NOTE — Consult Note (Signed)
[place] Nurse wound consult note Reason for Consult:Moisture Associated Skin Damage to sacrum and left and right gluteal.  Wound type:MASD Pressure Ulcer POA: Yes Measurement: 2 cm x 2 cm erythema bilateral upper buttocks.  Left sacral area with 1 cm diameter denuded skin Wound [id number] and moist Drainage (amount, consistency, odor)none noted  Periwound:Intact Dressing procedure/placement/frequency:Cleanse buttocks and sacrum with soap and water.  Apply sacral silicone border foam dressing.  Change every 3 days and PRN soilage. Ordered a mattress replacement with low air loss feature.  Please avoid disposable pads and briefs to skin.  Will not follow at this time.  Please re-consult if needed.  Domenic Moras RN BSN [place] Pager [phone]

## 2016-04-21 NOTE — Progress Notes (Signed)
Alert and oriented, c/o abdominal pain improved with prn medication, poor appetite, sister at bedside in am and updated by Dr. Verdell Carmine via telephone. Loose bm throughout shift, MD notified. Evaluated by wound consult RN, placed on specialty bed, bed bound, incontinent of stool, urostomy bag emptied by RN, on IV antibiotics for UTI, IV fluids infusing, potassium replacement given. Sacral ulcer cleansed, foam patch changed.

## 2016-04-21 NOTE — Consult Note (Signed)
Pharmacy Antibiotic Note  [name] is a 50 y.o. male admitted on 04/20/2016 with bacteremia.  Pharmacy has been consulted for vancomycin dosing.  Plan: Pt received one dose of vancomycin 1 g in the ED last evening at 2200. Vancomycin 500 IV every 24 hours.  Goal trough 15-20 mcg/mL. Trough at steady state 7/22 @ 1830  Height: 4\' 11"  (149.9 cm) Weight: 84 lb (38.102 kg) IBW/kg (Calculated) : 47.7  Temp (24hrs), Avg:98.2 F (36.8 C), Min:97.9 F (36.6 C), Max:98.6 F (37 C)   Recent Labs Lab 04/20/16 2120 04/20/16 2150 04/21/16 0055  WBC 20.3*  --   --   CREATININE 1.12  --   --   LATICACIDVEN  --  2.2* 1.0    Estimated Creatinine Clearance: 43.5 mL/min (by C-G formula based on Cr of 1.12).    Allergies  Allergen Reactions  . Ivp Dye [Iodinated Diagnostic Agents] Hives  . Latex   . Morphine And Related Nausea And Vomiting  . Sulfa Antibiotics     Antimicrobials this admission: vancomycin 7/18 >>  vancomycin 7/18 >>   Dose adjustments this admission:   Microbiology results: Recent Results (from the past 240 hour(s))  Blood Culture (routine x 2)     Status: None (Preliminary result)   Collection Time: 04/20/16  9:50 PM  Result Value Ref Range Status   Specimen Description BLOOD RIGHT ASSIST CONTROL  Final   Special Requests   Final    BOTTLES DRAWN AEROBIC AND ANAEROBIC  4CCAERO,4CCANA   Culture NO GROWTH < 24 HOURS  Final   Report Status PENDING  Incomplete  Blood Culture (routine x 2)     Status: None (Preliminary result)   Collection Time: 04/20/16  9:50 PM  Result Value Ref Range Status   Specimen Description BLOOD LEFT ARM  Final   Special Requests   Final    BOTTLES DRAWN AEROBIC AND ANAEROBIC  5CCAERO, 4CCANA   Culture  Setup Time   Final    GRAM POSITIVE COCCI ANAEROBIC BOTTLE ONLY CRITICAL RESULT CALLED TO, READ BACK BY AND VERIFIED WITH: Wilberth Damon Pearl 04/21/16 1816 VKB Organism ID to follow    Culture   Final    GRAM POSITIVE  COCCI ANAEROBIC BOTTLE ONLY IDENTIFICATION TO FOLLOW    Report Status PENDING  Incomplete  Blood Culture ID Panel (Reflexed)     Status: Abnormal   Collection Time: 04/20/16  9:50 PM  Result Value Ref Range Status   Enterococcus species NOT DETECTED NOT DETECTED Final   Vancomycin resistance NOT DETECTED NOT DETECTED Final   Listeria monocytogenes NOT DETECTED NOT DETECTED Final   Staphylococcus species DETECTED (A) NOT DETECTED Final    Comment: CRITICAL RESULT CALLED TO, READ BACK BY AND VERIFIED WITH: Sahir Tolson 04/21/16 1816 VKB    Staphylococcus aureus NOT DETECTED NOT DETECTED Final   Methicillin resistance DETECTED (A) NOT DETECTED Final    Comment: CRITICAL RESULT CALLED TO, READ BACK BY AND VERIFIED WITH: Tommie Bohlken 04/21/16 1816 VKB    Streptococcus species NOT DETECTED NOT DETECTED Final   Streptococcus agalactiae NOT DETECTED NOT DETECTED Final   Streptococcus pneumoniae NOT DETECTED NOT DETECTED Final   Streptococcus pyogenes NOT DETECTED NOT DETECTED Final   Acinetobacter baumannii NOT DETECTED NOT DETECTED Final   Enterobacteriaceae species NOT DETECTED NOT DETECTED Final   Enterobacter cloacae complex NOT DETECTED NOT DETECTED Final   Escherichia coli NOT DETECTED NOT DETECTED Final   Klebsiella oxytoca NOT DETECTED NOT DETECTED Final   Klebsiella  pneumoniae NOT DETECTED NOT DETECTED Final   Proteus species NOT DETECTED NOT DETECTED Final   Serratia marcescens NOT DETECTED NOT DETECTED Final   Carbapenem resistance NOT DETECTED NOT DETECTED Final   Haemophilus influenzae NOT DETECTED NOT DETECTED Final   Neisseria meningitidis NOT DETECTED NOT DETECTED Final   Pseudomonas aeruginosa NOT DETECTED NOT DETECTED Final   Candida albicans NOT DETECTED NOT DETECTED Final   Candida glabrata NOT DETECTED NOT DETECTED Final   Candida krusei NOT DETECTED NOT DETECTED Final   Candida parapsilosis NOT DETECTED NOT DETECTED Final   Candida tropicalis NOT DETECTED NOT  DETECTED Final     Thank you for allowing pharmacy to be a part of this patient's care.  Ramond Dial 04/21/2016 6:39 PM

## 2016-04-21 NOTE — Progress Notes (Signed)
PHARMACY - PHYSICIAN COMMUNICATION CRITICAL VALUE ALERT - BLOOD CULTURE IDENTIFICATION (BCID)  Results for orders placed or performed during the hospital encounter of 04/20/16  Blood Culture ID Panel (Reflexed) (Collected: 04/20/2016  9:50 PM)  Result Value Ref Range   Enterococcus species NOT DETECTED NOT DETECTED   Vancomycin resistance NOT DETECTED NOT DETECTED   Listeria monocytogenes NOT DETECTED NOT DETECTED   Staphylococcus species DETECTED (A) NOT DETECTED   Staphylococcus aureus NOT DETECTED NOT DETECTED   Methicillin resistance DETECTED (A) NOT DETECTED   Streptococcus species NOT DETECTED NOT DETECTED   Streptococcus agalactiae NOT DETECTED NOT DETECTED   Streptococcus pneumoniae NOT DETECTED NOT DETECTED   Streptococcus pyogenes NOT DETECTED NOT DETECTED   Acinetobacter baumannii NOT DETECTED NOT DETECTED   Enterobacteriaceae species NOT DETECTED NOT DETECTED   Enterobacter cloacae complex NOT DETECTED NOT DETECTED   Escherichia coli NOT DETECTED NOT DETECTED   Klebsiella oxytoca NOT DETECTED NOT DETECTED   Klebsiella pneumoniae NOT DETECTED NOT DETECTED   Proteus species NOT DETECTED NOT DETECTED   Serratia marcescens NOT DETECTED NOT DETECTED   Carbapenem resistance NOT DETECTED NOT DETECTED   Haemophilus influenzae NOT DETECTED NOT DETECTED   Neisseria meningitidis NOT DETECTED NOT DETECTED   Pseudomonas aeruginosa NOT DETECTED NOT DETECTED   Candida albicans NOT DETECTED NOT DETECTED   Candida glabrata NOT DETECTED NOT DETECTED   Candida krusei NOT DETECTED NOT DETECTED   Candida parapsilosis NOT DETECTED NOT DETECTED   Candida tropicalis NOT DETECTED NOT DETECTED    Name of physician (or Provider) Contacted: Dr. Ether Griffins  Changes to prescribed antibiotics required: Vancomycin restarted-see consult note for dosing  Ramond Dial 04/21/2016  6:41 PM

## 2016-04-21 NOTE — ED Notes (Signed)
Pt soiled self again with large BM, pt cleaned up, new brief and sheets given. Pt tolerated moving around in bed well, vitals stable, NAD noted.

## 2016-04-22 LAB — BASIC METABOLIC PANEL
ANION GAP: 5 (ref 5–15)
BUN: 20 mg/dL (ref 6–20)
CHLORIDE: 116 mmol/L — AB (ref 101–111)
CO2: 24 mmol/L (ref 22–32)
Calcium: 8.6 mg/dL — ABNORMAL LOW (ref 8.9–10.3)
Creatinine, Ser: 0.97 mg/dL (ref 0.61–1.24)
GFR calc Af Amer: >60 mL/min (ref >60)
GFR calc non Af Amer: >60 mL/min (ref >60)
GLUCOSE: 81 mg/dL (ref 65–99)
POTASSIUM: 4.8 mmol/L (ref 3.5–5.1)
Sodium: 145 mmol/L (ref 135–145)

## 2016-04-22 LAB — CBC
HEMATOCRIT: 39.2 % — AB (ref 40.0–52.0)
HEMOGLOBIN: 13.2 g/dL (ref 13.0–18.0)
MCH: 28.9 pg (ref 26.0–34.0)
MCHC: 33.7 g/dL (ref 32.0–36.0)
MCV: 85.8 fL (ref 80.0–100.0)
Platelets: 230 10*3/uL (ref 150–440)
RBC: 4.57 MIL/uL (ref 4.40–5.90)
RDW: 14.9 % — AB (ref 11.5–14.5)
WBC: 8.7 10*3/uL (ref 3.8–10.6)

## 2016-04-22 LAB — URINE CULTURE: Special Requests: NORMAL

## 2016-04-22 MED ORDER — CHLORPROMAZINE HCL 25 MG PO TABS
25.0000 mg | ORAL_TABLET | Freq: Three times a day (TID) | ORAL | Status: DC | PRN
  Administered 2016-04-22 – 2016-04-24 (×5): 25 mg via ORAL
  Filled 2016-04-22 (×6): qty 1

## 2016-04-22 MED ORDER — VANCOMYCIN HCL 500 MG IV SOLR
500.0000 mg | INTRAVENOUS | Status: DC
  Administered 2016-04-22 – 2016-04-23 (×2): 500 mg via INTRAVENOUS
  Filled 2016-04-22 (×3): qty 500

## 2016-04-22 NOTE — Progress Notes (Signed)
[place] [facility] [place] NAME: [name]    MR#:  [id number]  DATE OF BIRTH:  08/02/1966  SUBJECTIVE:   Patient here due to sepsis due to UTI. Blood cultures positive for coagulase-negative staph. Some nausea and hiccups this morning.  REVIEW OF SYSTEMS:    Review of Systems  Constitutional: Negative for fever and chills.  HENT: Negative for congestion and tinnitus.   Eyes: Negative for blurred vision and double vision.  Respiratory: Negative for cough, shortness of breath and wheezing.   Cardiovascular: Negative for chest pain, orthopnea and PND.  Gastrointestinal: Positive for nausea. Negative for vomiting, abdominal pain and diarrhea.  Genitourinary: Negative for dysuria and hematuria.  Neurological: Negative for dizziness, sensory change and focal weakness.  All other systems reviewed and are negative.   Nutrition: Regular Tolerating Diet: Yes Tolerating PT: Bedbound   DRUG ALLERGIES:   Allergies  Allergen Reactions  . Ivp Dye [Iodinated Diagnostic Agents] Hives  . Latex   . Morphine And Related Nausea And Vomiting  . Sulfa Antibiotics     VITALS:  Blood pressure 111/67, pulse 78, temperature 97.8 F (36.6 C), temperature source Oral, resp. rate 16, height '4\' 11"'  (1.499 m), weight 34.02 kg (75 lb), SpO2 100 %.  PHYSICAL EXAMINATION:   Physical Exam  GENERAL:  50 y.o.-year-old patient lying in the bed inno acute distress.  EYES: Pupils equal, round, reactive to light and accommodation. No scleral icterus. Extraocular muscles intact.  HEENT: Head atraumatic, normocephalic. Oropharynx and nasopharynx clear.  NECK:  Supple, no jugular venous distention. No thyroid enlargement, no tenderness.  LUNGS: Normal breath sounds bilaterally, no wheezing, rales, rhonchi. No use of accessory muscles of respiration.  CARDIOVASCULAR: S1, S2 normal. No murmurs, rubs, or gallops.  ABDOMEN: Soft, nontender, nondistended. Bowel sounds  present. No organomegaly or mass.  EXTREMITIES: No cyanosis, clubbing or edema b/l.  Legs contracted and with muscular atrophy due to hx of Spina Bifida.  NEUROLOGIC: Cranial nerves II through XII are intact. No focal Motor or sensory deficits b/l.  Bedbound, globally weak.  PSYCHIATRIC: The patient is alert and oriented x 3.  SKIN: No obvious rash, lesion, or ulcer.    LABORATORY PANEL:   CBC  Recent Labs Lab 04/22/16 0603  WBC 8.7  HGB 13.2  HCT 39.2*  PLT 230   ------------------------------------------------------------------------------------------------------------------  Chemistries   Recent Labs Lab 04/20/16 2120 04/21/16 1134 04/22/16 0603  NA 140  --  145  K 2.9*  --  4.8  CL 104  --  116*  CO2 23  --  24  GLUCOSE 123*  --  81  BUN 33*  --  20  CREATININE 1.12  --  0.97  CALCIUM 9.3  --  8.6*  MG 2.4 2.2  --   AST 23  --   --   ALT 14*  --   --   ALKPHOS 85  --   --   BILITOT 1.1  --   --    ------------------------------------------------------------------------------------------------------------------  Cardiac Enzymes No results for input(s): TROPONINI in the last 168 hours. ------------------------------------------------------------------------------------------------------------------  RADIOLOGY:  Ct Abdomen Pelvis Wo Contrast  04/21/2016  CLINICAL DATA:  Nausea, vomiting, and abdominal distention. EXAM: CT ABDOMEN AND PELVIS WITHOUT CONTRAST TECHNIQUE: Multidetector CT imaging of the abdomen and pelvis was performed following the standard protocol without IV contrast. COMPARISON:  04/24/2015 FINDINGS: Lower chest and abdominal wall: Interval healing of left ischial sacral decubitus ulcer, no longer reaching bone.  No evidence of abscess. Circumferential thickening of the lower esophagus. Hepatobiliary: No focal liver abnormality.No evidence of biliary obstruction or stone. Pancreas: Unremarkable. Spleen: Unremarkable. Adrenals/Urinary Tract: Negative  adrenals. Small and scarred kidneys with layering calcification in the chronically dilated collecting system. Decompressed urostomy in the right lower quadrant. Stomach/Bowel: 9 cm diameter rectal stool ball with proximal gaseous distention of colon. The rectum measures up to 12 cm in diameter. No pneumatosis or perforation is seen. No small bowel obstruction. Appendix not well identified. No secondary signs of appendicitis. Reproductive:Negative Vascular/Lymphatic: No acute vascular abnormality. No mass or adenopathy. Other: No ascites or pneumoperitoneum. Musculoskeletal: Spinal dysraphism, scoliosis/ lumbar non segmentation, and sequela of contractures and immobility. Dysmorphic left hip with uncovering and chronic hip effusion. IMPRESSION: 1. Rectal impaction. Colonic distention above previous baseline from obstruction. 2. Distal esophageal thickening, likely esophagitis from reflux/vomiting in this setting. 3. Sequela of spina bifida as described above. Electronically Signed   By: Monte Fantasia M.D.   On: 04/21/2016 00:13   Dg Chest Port 1 View  04/20/2016  CLINICAL DATA:  Sepsis and abdominal pain with distention. EXAM: PORTABLE CHEST 1 VIEW COMPARISON:  None. FINDINGS: Low volume chest. The diaphragm is likely elevated by gaseous distention of bowel. Normal heart size and mediastinal contours. Dextroscoliosis. There is no edema, consolidation, effusion, or pneumothorax. Gaseous distention of bowel seen in the upper abdomen. Similar appearance seen 04/24/2015 by abdominal CT. No visualized pneumoperitoneum. IMPRESSION: 1. Low volume chest without edema or infiltrate. 2. Prominent gaseous distention of bowel in the upper abdomen. 3. Dextroscoliosis. Electronically Signed   By: Monte Fantasia M.D.   On: 04/20/2016 22:11     ASSESSMENT AND PLAN:   50 year old male with past medical history of spina bifida, decubitus ulcers, who presents to the hospital due to weakness, nausea, vomiting and diarrhea  and noted to have a urinary tract infection.  1. Urinary tract infection-continue IV Zosyn.  - afebrile, hemodynamically stable. Urine cultures consistent with contamination.   2. Sepsis-patient met criteria given his leukocytosis, fever and tachycardia. -Secondary to UTI. Continue IV Zosyn.  Blood cultures were + for gram positive cocci but it's coagulase negative staph and will d/c Vanc again.  - afebrile, hemodynamically stable.   3. Diarrhea-seems chronic for the patient. -No acute infectious pathology noted. CT scan of the abdomen and pelvis showing fecal impaction but no evidence of obstruction.  4. Hiccups - PRN Thorazine and will monitor.   5. Hypokalemia- improved w/ supplementation.    All the records are reviewed and case discussed with Care Management/Social Workerr. Management plans discussed with the patient, family and they are in agreement.  CODE STATUS: Full  DVT Prophylaxis: Lovenox  TOTAL TIME TAKING CARE OF THIS PATIENT: 30 minutes.   POSSIBLE D/C IN 1-2 DAYS, DEPENDING ON CLINICAL CONDITION.   Henreitta Leber M.D on 04/22/2016 at 12:42 PM  Between 7am to 6pm - Pager - [phone]  After 6pm go to [email or web address] - password [email or web address]  [place] [facility]  Office  [phone]  CC: Primary care physician; No PCP Per Patient

## 2016-04-22 NOTE — Progress Notes (Signed)
Took over pt care at 1600, pt alert and oriented, no noted complaints, voices any needs

## 2016-04-22 NOTE — Care Management (Addendum)
Admitted to [facility] with the diagnosis of sepsis. Lives with his mother. Sister is [name] [phone]) Recently discharged from [facility]. Uses a wheelchair to get around, but mostly bed bound. Recently received wound care at [facility] 03/26/16 per Dr. Quay Burow. Chronic foley in place, Born with spina bifida.  Mr. Soscia sleeping. Need to discuss services in the home and transportation. Shelbie Ammons RN MSN CCM Care Management [phone]

## 2016-04-22 NOTE — Clinical Documentation Improvement (Signed)
Hospitalist  Can the diagnosis of sacral decubitus ulcer be further specified?   Pressure Ulcer Stage - Stage1, Stage 2, Stage 3, Stage 4, Unstageable  Document any associated diagnoses/conditions such as: with gangrene  Other  Clinically Undetermined    Supporting Information: Sacral decubitus ulcer per 7/19 progress notes.   Please exercise your independent, professional judgment when responding. A specific answer is not anticipated or expected.   Thank You,  [place] [phone]

## 2016-04-22 NOTE — Consult Note (Signed)
Pharmacy Antibiotic Note  [name] is a 51 y.o. male admitted on 04/20/2016 with bacteremia.  Pharmacy has been consulted for vancomycin and Zosyn dosing. Per microbiology lab, GPC/CNS in at least 3 bottles with BCID from one bottle showing Staph species with vancomycin resistance.   Plan: Vancomycin d/c due to possible contamination. After discussion with Dr. Verdell Carmine, will need to restart vancomycin with both sets of BCx growing GPC. Will continue vancomycin 500 mg iv q 24 hours and check a trough with the 4th dose.  Continue Zosyn 3.375 g EI q 8 hours. Consider d/c Zosyn with urine cultures negative and BCx growing GPC.    Height: 4\' 11"  (149.9 cm) Weight: 75 lb (34.02 kg) (different bed than previous days weight) IBW/kg (Calculated) : 47.7  Temp (24hrs), Avg:97.8 F (36.6 C), Min:97.6 F (36.4 C), Max:97.9 F (36.6 C)   Recent Labs Lab 04/20/16 2120 04/20/16 2150 04/21/16 0055 04/22/16 0603  WBC 20.3*  --   --  8.7  CREATININE 1.12  --   --  0.97  LATICACIDVEN  --  2.2* 1.0  --     Estimated Creatinine Clearance: 44.8 mL/min (by C-G formula based on Cr of 0.97).    Allergies  Allergen Reactions  . Ivp Dye [Iodinated Diagnostic Agents] Hives  . Latex   . Morphine And Related Nausea And Vomiting  . Sulfa Antibiotics     Antimicrobials this admission: vancomycin 7/18 >>  vancomycin 7/18 >>   Dose adjustments this admission:   Microbiology results: Recent Results (from the past 240 hour(s))  Urine culture     Status: Abnormal   Collection Time: 04/20/16  9:15 PM  Result Value Ref Range Status   Specimen Description URINE, RANDOM  Final   Special Requests Normal  Final   Culture MULTIPLE SPECIES PRESENT, SUGGEST RECOLLECTION (A)  Final   Report Status 04/22/2016 FINAL  Final  Blood Culture (routine x 2)     Status: None (Preliminary result)   Collection Time: 04/20/16  9:50 PM  Result Value Ref Range Status   Specimen Description BLOOD RIGHT ANTECUBITAL   Final   Special Requests   Final    BOTTLES DRAWN AEROBIC AND ANAEROBIC  4CCAERO,4CCANA   Culture  Setup Time   Final    GRAM POSITIVE COCCI ANAEROBIC BOTTLE ONLY CRITICAL VALUE NOTED.  VALUE IS CONSISTENT WITH PREVIOUSLY REPORTED AND CALLED VALUE.    Culture   Final    GRAM POSITIVE COCCI CULTURE REINCUBATED FOR BETTER GROWTH Performed at [facility]    Report Status PENDING  Incomplete  Blood Culture (routine x 2)     Status: Abnormal (Preliminary result)   Collection Time: 04/20/16  9:50 PM  Result Value Ref Range Status   Specimen Description BLOOD LEFT ARM  Final   Special Requests   Final    BOTTLES DRAWN AEROBIC AND ANAEROBIC  5CCAERO, 4CCANA   Culture  Setup Time   Final    GRAM POSITIVE COCCI ANAEROBIC BOTTLE ONLY CRITICAL RESULT CALLED TO, READ BACK BY AND VERIFIED WITH: MELISSA Deercroft 04/21/16 1816 VKB    Culture STAPHYLOCOCCUS SPECIES (COAGULASE NEGATIVE) (A)  Final   Report Status PENDING  Incomplete  Blood Culture ID Panel (Reflexed)     Status: Abnormal   Collection Time: 04/20/16  9:50 PM  Result Value Ref Range Status   Enterococcus species NOT DETECTED NOT DETECTED Final   Vancomycin resistance NOT DETECTED NOT DETECTED Final   Listeria monocytogenes NOT DETECTED NOT DETECTED  Final   Staphylococcus species DETECTED (A) NOT DETECTED Final    Comment: CRITICAL RESULT CALLED TO, READ BACK BY AND VERIFIED WITH: MELISSA MACCIA 04/21/16 1816 VKB    Staphylococcus aureus NOT DETECTED NOT DETECTED Final   Methicillin resistance DETECTED (A) NOT DETECTED Final    Comment: CRITICAL RESULT CALLED TO, READ BACK BY AND VERIFIED WITH: MELISSA MACCIA 04/21/16 1816 VKB    Streptococcus species NOT DETECTED NOT DETECTED Final   Streptococcus agalactiae NOT DETECTED NOT DETECTED Final   Streptococcus pneumoniae NOT DETECTED NOT DETECTED Final   Streptococcus pyogenes NOT DETECTED NOT DETECTED Final   Acinetobacter baumannii NOT DETECTED NOT DETECTED Final    Enterobacteriaceae species NOT DETECTED NOT DETECTED Final   Enterobacter cloacae complex NOT DETECTED NOT DETECTED Final   Escherichia coli NOT DETECTED NOT DETECTED Final   Klebsiella oxytoca NOT DETECTED NOT DETECTED Final   Klebsiella pneumoniae NOT DETECTED NOT DETECTED Final   Proteus species NOT DETECTED NOT DETECTED Final   Serratia marcescens NOT DETECTED NOT DETECTED Final   Carbapenem resistance NOT DETECTED NOT DETECTED Final   Haemophilus influenzae NOT DETECTED NOT DETECTED Final   Neisseria meningitidis NOT DETECTED NOT DETECTED Final   Pseudomonas aeruginosa NOT DETECTED NOT DETECTED Final   Candida albicans NOT DETECTED NOT DETECTED Final   Candida glabrata NOT DETECTED NOT DETECTED Final   Candida krusei NOT DETECTED NOT DETECTED Final   Candida parapsilosis NOT DETECTED NOT DETECTED Final   Candida tropicalis NOT DETECTED NOT DETECTED Final     Thank you for allowing pharmacy to be a part of this patient's care.  Ulice Dash D 04/22/2016 1:23 PM

## 2016-04-22 NOTE — Care Management Important Message (Signed)
Important Message  Patient Details  Name: Kimar Salee MRN: [id number] Date of Birth: June 07, 1966   Medicare Important Message Given:  Yes    Shelbie Ammons, RN 04/22/2016, 1:06 PM

## 2016-04-23 LAB — CBC
HEMATOCRIT: 43.4 % (ref 40.0–52.0)
HEMOGLOBIN: 14.3 g/dL (ref 13.0–18.0)
MCH: 28.3 pg (ref 26.0–34.0)
MCHC: 33 g/dL (ref 32.0–36.0)
MCV: 85.8 fL (ref 80.0–100.0)
Platelets: 241 10*3/uL (ref 150–440)
RBC: 5.05 MIL/uL (ref 4.40–5.90)
RDW: 15.2 % — ABNORMAL HIGH (ref 11.5–14.5)
WBC: 12.1 10*3/uL — AB (ref 3.8–10.6)

## 2016-04-23 MED ORDER — ENSURE ENLIVE PO LIQD
237.0000 mL | Freq: Two times a day (BID) | ORAL | Status: DC
  Administered 2016-04-23 – 2016-04-26 (×6): 237 mL via ORAL

## 2016-04-23 NOTE — Care Management Note (Addendum)
Case Management Note  Patient Details  Name: [name] MRN: [id number] Date of Birth: 07/03/1966  Subjective/Objective:                 Spoke with patient at the bedside. He states that he is bed bound, lives at home with his step dad who is in a wheelchair. He states his primary caretaker is his sister [name] [phone]. Patient stated that he would like to use [facility] for [facility], as he has used them in the past. He states that if he needed to go to a SNF he would like to go to [facility]. Granted permission to speak with sister. [name] asked for home care 6-8 hours a day. She stated that she is in the process of getting PCS through Medicaid, and they have already sent a person come to the house to assess the situation. She was interested if CM could expedite this process and arrange this to start at [place]. Discussed option on private duty care, [name] stated that would not financially be an option, and agreed to home health with [name]. No DME needed at this time.   Action/Plan:  Patient would like to use [name] for home health at DC. Referral made for RN PT OT [facility] SW. Will need [facility] order prior to DC. Will need to maximize home health services including SW.  Expected Discharge Date:                  Expected Discharge Plan:  [name]  In-House Referral:     Discharge planning Services  CM Consult  Post Acute Care Choice:  Home Health Choice offered to:  Patient, Sibling  DME Arranged:  N/A DME Agency:  NA  HH Arranged:    [place] Agency:  [facility] (now [facility])  Status of Service:  In process, will continue to follow  If discussed at Long Length of Stay Meetings, dates discussed:    Additional Comments:  [name], RN 04/23/2016, 2:26 PM

## 2016-04-23 NOTE — Progress Notes (Signed)
Initial Nutrition Assessment  DOCUMENTATION CODES:   Underweight  INTERVENTION:   Cater to pt preferences on Regular Diet order Recommend Ensure Enlive po BID, each supplement provides 350 kcal and 20 grams of protein   NUTRITION DIAGNOSIS:   Inadequate oral intake related to acute illness as evidenced by per patient/family report.  GOAL:   Patient will meet greater than or equal to 90% of their needs  MONITOR:   PO intake, Supplement acceptance, Labs, Weight trends, Skin, I & O's  REASON FOR ASSESSMENT:   Rounds    ASSESSMENT:   Pt admitted with past medical history significant for paraplegia secondary to spina bifida presents complaining of 1 day h/o nausea and vomiting with sepsis and UTI.  Past Medical History  Diagnosis Date  . Spina bifida ([place])   . Decubitus ulcer   . Renal disorder     Diet Order:  Diet regular Room service appropriate?: Yes; Fluid consistency:: Thin   Pt reports ordering grilled cheese sandwich and chips for lunch but only eating sandwich. Pt reports not wanting to overdo it as he has been nauseated with hiccups. Pt reports sister brought a hamburger from [employer] a few days ago but he did not want it.   Pt reports appetite started to decrease after eating pork bbq sandwich on Monday brought in by [employer]. Pt reports his appetite was good prior to Monday as usual, but since has not been that great. Pt reports [employer] brings his meals on Monday for the week and he usually eats 2 meals per day. Pt reports liking to drink Boost when he has them.   Medications: reviewed Labs: Potassium 2.9 on admission, WDL this am   Gastrointestinal Profile: Last BM:  04/22/2016   Nutrition-Focused Physical Exam Findings:  Unable to complete Nutrition-Focused physical exam at this time.    Weight Change: Pt reports not knowing his weight trend or his usual body weight. On sizewise bed on visit, pt weight 91lbs, this am measured  77lbs.   Skin:   (Denuded skin on sacrum)   Height:   Ht Readings from Last 1 Encounters:  04/21/16 4\' 11"  (1.499 m)    Weight:   Wt Readings from Last 1 Encounters:  04/23/16 77 lb (34.927 kg)   Wt Readings from Last 10 Encounters:  04/23/16 77 lb (34.927 kg)  04/24/15 83 lb 9 oz (37.904 kg)  04/14/15 90 lb (40.824 kg)     Ideal Body Weight:   45kg  BMI:  Body mass index is 15.54 kg/(m^2).  Estimated Nutritional Needs:   Kcal:  1500-1780kcals  Protein:  53-66g protein  Fluid:  >/= 1.5L fluid  EDUCATION NEEDS:   No education needs identified at this time  Dwyane Luo, RD, LDN Pager [phone] Weekend/On-Call Pager [phone]

## 2016-04-23 NOTE — Progress Notes (Signed)
Dr. Verdell Carmine notified HR has been creeping up today and is now maintaining low 110's. Verbal order for EKG. Will continue to monitor.

## 2016-04-23 NOTE — Progress Notes (Signed)
[facility] at [place] NAME: [name]    MR#:  [id number]  DATE OF BIRTH:  01-09-1966  SUBJECTIVE:   Pt. Here due to sepsis from UTI but urine cultures consistent w/ contamination. BC + Coag. (-) staph.  Hemodynamically stable.  Having hiccups and nausea.  REVIEW OF SYSTEMS:    Review of Systems  Constitutional: Negative for fever and chills.  HENT: Negative for congestion and tinnitus.   Eyes: Negative for blurred vision and double vision.  Respiratory: Negative for cough, shortness of breath and wheezing.   Cardiovascular: Negative for chest pain, orthopnea and PND.  Gastrointestinal: Positive for nausea. Negative for vomiting, abdominal pain and diarrhea.  Genitourinary: Negative for dysuria and hematuria.  Neurological: Negative for dizziness, sensory change and focal weakness.  All other systems reviewed and are negative.   Nutrition: Regular Tolerating Diet: Yes Tolerating PT: Bedbound   DRUG ALLERGIES:   Allergies  Allergen Reactions  . Ivp Dye [Iodinated Diagnostic Agents] Hives  . Latex   . Morphine And Related Nausea And Vomiting  . Sulfa Antibiotics     VITALS:  Blood pressure 128/81, pulse 112, temperature 98.3 F (36.8 C), temperature source Oral, resp. rate 18, height '4\' 11"'  (1.499 m), weight 34.927 kg (77 lb), SpO2 98 %.  PHYSICAL EXAMINATION:   Physical Exam  GENERAL:  50 y.o.-year-old patient lying in the bed inno acute distress.  EYES: Pupils equal, round, reactive to light and accommodation. No scleral icterus. Extraocular muscles intact.  HEENT: Head atraumatic, normocephalic. Oropharynx and nasopharynx clear.  NECK:  Supple, no jugular venous distention. No thyroid enlargement, no tenderness.  LUNGS: Normal breath sounds bilaterally, no wheezing, rales, rhonchi. No use of accessory muscles of respiration.  CARDIOVASCULAR: S1, S2 normal. No murmurs, rubs, or gallops.  ABDOMEN: Soft, nontender,  nondistended. Bowel sounds present. No organomegaly or mass.  EXTREMITIES: No cyanosis, clubbing or edema b/l.  Legs contracted and with muscular atrophy due to hx of Spina Bifida.  NEUROLOGIC: Cranial nerves II through XII are intact. No focal Motor or sensory deficits b/l.  Bedbound, globally weak.  PSYCHIATRIC: The patient is alert and oriented x 3.  SKIN: No obvious rash, lesion, or ulcer.  Stage 1 decub. Ulcer.     LABORATORY PANEL:   CBC  Recent Labs Lab 04/23/16 0442  WBC 12.1*  HGB 14.3  HCT 43.4  PLT 241   ------------------------------------------------------------------------------------------------------------------  Chemistries   Recent Labs Lab 04/20/16 2120 04/21/16 1134 04/22/16 0603  NA 140  --  145  K 2.9*  --  4.8  CL 104  --  116*  CO2 23  --  24  GLUCOSE 123*  --  81  BUN 33*  --  20  CREATININE 1.12  --  0.97  CALCIUM 9.3  --  8.6*  MG 2.4 2.2  --   AST 23  --   --   ALT 14*  --   --   ALKPHOS 85  --   --   BILITOT 1.1  --   --    ------------------------------------------------------------------------------------------------------------------  Cardiac Enzymes No results for input(s): TROPONINI in the last 168 hours. ------------------------------------------------------------------------------------------------------------------  RADIOLOGY:  No results found.   ASSESSMENT AND PLAN:   50 year old male with past medical history of spina bifida, decubitus ulcers, who presents to the hospital due to weakness, nausea, vomiting and diarrhea and noted to have a urinary tract infection.  1. Urinary tract infection- ?? UTI (vs) colonization  given hx of Urostomy.  - will d/c Zosyn and monitor.   - afebrile, hemodynamically stable.   2. Sepsis-patient met criteria given his leukocytosis, fever and tachycardia. - source thought to be UTI.  No other obvious source.  Blood culture + for coagulase negative staph. This is in 2 bottles and likely  significant.  - cont. Vancomycin. Will repeat BC to make sure they are clearing.  Will check 2-D echo to r/o Endocarditis.  - Discussed w/ ID (Dr. Johnnye Sima) over the phone.   3. Diarrhea-seems chronic for the patient. -No acute infectious pathology noted. CT scan of the abdomen and pelvis showing fecal impaction but no evidence of obstruction.  4. Hiccups - cont. PRN Thorazine  5. Hypokalemia- improved w/ supplementation.    All the records are reviewed and case discussed with Care Management/Social Workerr. Management plans discussed with the patient, family and they are in agreement.  CODE STATUS: Full  DVT Prophylaxis: Lovenox  TOTAL TIME TAKING CARE OF THIS PATIENT: 30 minutes.   POSSIBLE D/C IN 2-3 DAYS, DEPENDING ON CLINICAL CONDITION.   Henreitta Leber M.D on 04/23/2016 at 12:54 PM  Between 7am to 6pm - Pager - [phone]  After 6pm go to [email or web address] - password [email or web address] [facility]  [place] [facility]  Office  [phone]  CC: Primary care physician; No PCP Per Patient

## 2016-04-23 NOTE — Consult Note (Signed)
Pharmacy Antibiotic Note  [name] is a 50 y.o. male admitted on 04/20/2016 with bacteremia.  Pharmacy has been consulted for vancomycin and Zosyn dosing. Per microbiology lab, GPC/CNS in at least 3 bottles with BCID from one bottle showing Staph species with methicillin resistance detected.  Plan: Vancomycin d/c due to possible contamination. After discussion with Dr. Verdell Carmine, will need to restart vancomycin with both sets of BCx growing GPC. Will continue vancomycin 500 mg iv q 24 hours and check a trough with the 4th dose.   Continue Zosyn 3.375 g EI q 8 hours. Consider d/c Zosyn with urine cultures negative and BCx growing GPC.    Height: 4\' 11"  (149.9 cm) Weight: 77 lb (34.927 kg) IBW/kg (Calculated) : 47.7  Temp (24hrs), Avg:98 F (36.7 C), Min:97.8 F (36.6 C), Max:98.3 F (36.8 C)   Recent Labs Lab 04/20/16 2120 04/20/16 2150 04/21/16 0055 04/22/16 0603 04/23/16 0442  WBC 20.3*  --   --  8.7 12.1*  CREATININE 1.12  --   --  0.97  --   LATICACIDVEN  --  2.2* 1.0  --   --     Estimated Creatinine Clearance: 46 mL/min (by C-G formula based on Cr of 0.97).    Allergies  Allergen Reactions  . Ivp Dye [Iodinated Diagnostic Agents] Hives  . Latex   . Morphine And Related Nausea And Vomiting  . Sulfa Antibiotics     Antimicrobials this admission: vancomycin 7/18 >>  vancomycin 7/18 >>   Dose adjustments this admission:   Microbiology results: Recent Results (from the past 240 hour(s))  Urine culture     Status: Abnormal   Collection Time: 04/20/16  9:15 PM  Result Value Ref Range Status   Specimen Description URINE, RANDOM  Final   Special Requests Normal  Final   Culture MULTIPLE SPECIES PRESENT, SUGGEST RECOLLECTION (A)  Final   Report Status 04/22/2016 FINAL  Final  Blood Culture (routine x 2)     Status: None (Preliminary result)   Collection Time: 04/20/16  9:50 PM  Result Value Ref Range Status   Specimen Description BLOOD RIGHT ANTECUBITAL   Final   Special Requests   Final    BOTTLES DRAWN AEROBIC AND ANAEROBIC  4CCAERO,4CCANA   Culture  Setup Time   Final    GRAM POSITIVE COCCI ANAEROBIC BOTTLE ONLY CRITICAL VALUE NOTED.  VALUE IS CONSISTENT WITH PREVIOUSLY REPORTED AND CALLED VALUE.    Culture   Final    GRAM POSITIVE COCCI IDENTIFICATION AND SUSCEPTIBILITIES TO FOLLOW Performed at [facility]    Report Status PENDING  Incomplete  Blood Culture (routine x 2)     Status: Abnormal (Preliminary result)   Collection Time: 04/20/16  9:50 PM  Result Value Ref Range Status   Specimen Description BLOOD LEFT ARM  Final   Special Requests   Final    BOTTLES DRAWN AEROBIC AND ANAEROBIC  5CCAERO, 4CCANA   Culture  Setup Time   Final    GRAM POSITIVE COCCI ANAEROBIC BOTTLE ONLY CRITICAL RESULT CALLED TO, READ BACK BY AND VERIFIED WITH: MELISSA Maize 04/21/16 1816 VKB    Culture (A)  Final    STAPHYLOCOCCUS SPECIES (COAGULASE NEGATIVE) CULTURE REINCUBATED FOR BETTER GROWTH Performed at [facility]    Report Status PENDING  Incomplete  Blood Culture ID Panel (Reflexed)     Status: Abnormal   Collection Time: 04/20/16  9:50 PM  Result Value Ref Range Status   Enterococcus species NOT DETECTED NOT DETECTED  Final   Vancomycin resistance NOT DETECTED NOT DETECTED Final   Listeria monocytogenes NOT DETECTED NOT DETECTED Final   Staphylococcus species DETECTED (A) NOT DETECTED Final    Comment: CRITICAL RESULT CALLED TO, READ BACK BY AND VERIFIED WITH: MELISSA MACCIA 04/21/16 1816 VKB    Staphylococcus aureus NOT DETECTED NOT DETECTED Final   Methicillin resistance DETECTED (A) NOT DETECTED Final    Comment: CRITICAL RESULT CALLED TO, READ BACK BY AND VERIFIED WITH: MELISSA MACCIA 04/21/16 1816 VKB    Streptococcus species NOT DETECTED NOT DETECTED Final   Streptococcus agalactiae NOT DETECTED NOT DETECTED Final   Streptococcus pneumoniae NOT DETECTED NOT DETECTED Final   Streptococcus pyogenes NOT DETECTED  NOT DETECTED Final   Acinetobacter baumannii NOT DETECTED NOT DETECTED Final   Enterobacteriaceae species NOT DETECTED NOT DETECTED Final   Enterobacter cloacae complex NOT DETECTED NOT DETECTED Final   Escherichia coli NOT DETECTED NOT DETECTED Final   Klebsiella oxytoca NOT DETECTED NOT DETECTED Final   Klebsiella pneumoniae NOT DETECTED NOT DETECTED Final   Proteus species NOT DETECTED NOT DETECTED Final   Serratia marcescens NOT DETECTED NOT DETECTED Final   Carbapenem resistance NOT DETECTED NOT DETECTED Final   Haemophilus influenzae NOT DETECTED NOT DETECTED Final   Neisseria meningitidis NOT DETECTED NOT DETECTED Final   Pseudomonas aeruginosa NOT DETECTED NOT DETECTED Final   Candida albicans NOT DETECTED NOT DETECTED Final   Candida glabrata NOT DETECTED NOT DETECTED Final   Candida krusei NOT DETECTED NOT DETECTED Final   Candida parapsilosis NOT DETECTED NOT DETECTED Final   Candida tropicalis NOT DETECTED NOT DETECTED Final     Thank you for allowing pharmacy to be a part of this patient's care.  Elener Custodio G 04/23/2016 10:42 AM

## 2016-04-24 ENCOUNTER — Inpatient Hospital Stay (HOSPITAL_COMMUNITY)
Admit: 2016-04-24 | Discharge: 2016-04-24 | Disposition: A | Discharge status: Home / Self Care | Payer: Medicare Other | Attending: Specialist | Admitting: Specialist

## 2016-04-24 DIAGNOSIS — R7881 Bacteremia: Secondary | ICD-10-CM

## 2016-04-24 LAB — CBC
HEMATOCRIT: 40.5 % (ref 40.0–52.0)
HEMOGLOBIN: 13.5 g/dL (ref 13.0–18.0)
MCH: 28.8 pg (ref 26.0–34.0)
MCHC: 33.4 g/dL (ref 32.0–36.0)
MCV: 86.2 fL (ref 80.0–100.0)
PLATELETS: 254 10*3/uL (ref 150–440)
RBC: 4.7 MIL/uL (ref 4.40–5.90)
RDW: 14.6 % — ABNORMAL HIGH (ref 11.5–14.5)
WBC: 9.7 10*3/uL (ref 3.8–10.6)

## 2016-04-24 LAB — CREATININE, SERUM
Creatinine, Ser: 0.76 mg/dL (ref 0.61–1.24)
GFR calc Af Amer: >60 mL/min (ref >60)
GFR calc non Af Amer: >60 mL/min (ref >60)

## 2016-04-24 LAB — ECHOCARDIOGRAM COMPLETE
HEIGHTINCHES: 59 in
Weight: 1200 oz

## 2016-04-24 LAB — VANCOMYCIN, TROUGH: Vancomycin Tr: 7 ug/mL — ABNORMAL LOW (ref 15–20)

## 2016-04-24 MED ORDER — SODIUM CHLORIDE 0.9% FLUSH
3.0000 mL | Freq: Two times a day (BID) | INTRAVENOUS | Status: DC
  Administered 2016-04-24 – 2016-04-26 (×5): 3 mL via INTRAVENOUS

## 2016-04-24 MED ORDER — SODIUM CHLORIDE 0.9% FLUSH
3.0000 mL | INTRAVENOUS | Status: DC | PRN
  Administered 2016-04-24 – 2016-04-25 (×2): 3 mL via INTRAVENOUS
  Filled 2016-04-24: qty 3

## 2016-04-24 MED ORDER — VANCOMYCIN HCL 500 MG IV SOLR
500.0000 mg | Freq: Two times a day (BID) | INTRAVENOUS | Status: DC
  Administered 2016-04-24 – 2016-04-26 (×4): 500 mg via INTRAVENOUS
  Filled 2016-04-24 (×6): qty 500

## 2016-04-24 MED ORDER — FAMOTIDINE IN NACL 20-0.9 MG/50ML-% IV SOLN
20.0000 mg | Freq: Two times a day (BID) | INTRAVENOUS | Status: DC
  Administered 2016-04-24 – 2016-04-26 (×5): 20 mg via INTRAVENOUS
  Filled 2016-04-24 (×6): qty 50

## 2016-04-24 NOTE — Progress Notes (Signed)
[place] at [place] NAME: [name]    MR#:  [id number]  DATE OF BIRTH:  02/27/1966  SUBJECTIVE: Still having a lot of hiccups. Has nausea. No abdominal pain. Repeat blood cultures are negative so far. Waiting for echocardiogram results.    Pt. Here due to sepsis from UTI but urine cultures consistent w/ contamination. BC + Coag. (-) staph.  Hemodynamically stable.  Having hiccups and nausea.  REVIEW OF SYSTEMS:    Review of Systems  Constitutional: Negative for fever and chills.  HENT: Negative for congestion and tinnitus.   Eyes: Negative for blurred vision and double vision.  Respiratory: Negative for cough, shortness of breath and wheezing.   Cardiovascular: Negative for chest pain, orthopnea and PND.  Gastrointestinal: Positive for nausea. Negative for vomiting, abdominal pain and diarrhea.  Genitourinary: Negative for dysuria and hematuria.  Neurological: Negative for dizziness, sensory change and focal weakness.  All other systems reviewed and are negative.   Nutrition: Regular Tolerating Diet: Yes Tolerating PT: Bedbound   DRUG ALLERGIES:   Allergies  Allergen Reactions  . Ivp Dye [Iodinated Diagnostic Agents] Hives  . Latex   . Morphine And Related Nausea And Vomiting  . Sulfa Antibiotics     VITALS:  Blood pressure 137/88, pulse 104, temperature 98.6 F (37 C), temperature source Oral, resp. rate 18, height '4\' 11"'  (1.499 m), weight 34.02 kg (75 lb), SpO2 98 %.  PHYSICAL EXAMINATION:   Physical Exam  GENERAL:  50 y.o.-year-old patient lying in the bed inno acute distress. Constant hiccups. EYES: Pupils equal, round, reactive to light and accommodation. No scleral icterus. Extraocular muscles intact.  HEENT: Head atraumatic, normocephalic. Oropharynx and nasopharynx clear.  NECK:  Supple, no jugular venous distention. No thyroid enlargement, no tenderness.  LUNGS: Normal breath sounds bilaterally, no  wheezing, rales, rhonchi. No use of accessory muscles of respiration.  CARDIOVASCULAR: S1, S2 normal. No murmurs, rubs, or gallops.  ABDOMEN: Soft, nontender, nondistended. Bowel sounds present. No organomegaly or mass.  EXTREMITIES: No cyanosis, clubbing or edema b/l.  Legs contracted and with muscular atrophy due to hx of Spina Bifida.  NEUROLOGIC: Cranial nerves II through XII are intact. No focal Motor or sensory deficits b/l.  Bedbound, globally weak.  PSYCHIATRIC: The patient is alert and oriented x 3.  SKIN: No obvious rash, lesion, or ulcer.  Stage 1 decub. Ulcer.     LABORATORY PANEL:   CBC  Recent Labs Lab 04/24/16 0629  WBC 9.7  HGB 13.5  HCT 40.5  PLT 254   ------------------------------------------------------------------------------------------------------------------  Chemistries   Recent Labs Lab 04/20/16 2120 04/21/16 1134 04/22/16 0603 04/24/16 0629  NA 140  --  145  --   K 2.9*  --  4.8  --   CL 104  --  116*  --   CO2 23  --  24  --   GLUCOSE 123*  --  81  --   BUN 33*  --  20  --   CREATININE 1.12  --  0.97 0.76  CALCIUM 9.3  --  8.6*  --   MG 2.4 2.2  --   --   AST 23  --   --   --   ALT 14*  --   --   --   ALKPHOS 85  --   --   --   BILITOT 1.1  --   --   --    ------------------------------------------------------------------------------------------------------------------  Cardiac Enzymes No  results for input(s): TROPONINI in the last 168 hours. ------------------------------------------------------------------------------------------------------------------  RADIOLOGY:  No results found.   ASSESSMENT AND PLAN:   50 year old male with past medical history of spina bifida, decubitus ulcers, who presents to the hospital due to weakness, nausea, vomiting and diarrhea and noted to have a urinary tract infection.  1. Urinary tract infection- ?? UTI (vs) colonization given hx of Urostomy.  - will d/c Zosyn and monitor.   - afebrile,  hemodynamically stable.   2. Sepsis-patient met criteria given his leukocytosis, fever and tachycardia. - source thought to be UTI.  No other obvious source.  Blood culture + for coagulase negative staph. This is in 2 bottles and likely significant.  - cont. Vancomycin. Blood cultures are negative, follow echocardiogram. Dr.Sainani  discussed with ID physician at [facility]  yesterday.-    3. Diarrhea-seems chronic for the patient. -No acute infectious pathology noted. CT scan of the abdomen and pelvis showing fecal impaction but no evidence of obstruction.  4. Hiccups - cont. PRN Thorazine , Reglan, add IV PPIs.  5. Hypokalemia- improved w/ supplementation.  #6 .underweight;: Seen  by dietary,  continue ensure by mouth twice a day. #7. Moisture associated skin damage to sacrum, left and right gluteal area: Continue; foam dressing,  Low  loss mattress as per wound care recommendation.  sacral Presure  ulcer unstageable present on admission without gangrene.  All the records are reviewed and case discussed with Care Management/Social Workerr. Management plans discussed with the patient, family and they are in agreement.  CODE STATUS: Full  DVT Prophylaxis: Lovenox  TOTAL TIME TAKING CARE OF THIS PATIENT: 30 minutes.   POSSIBLE D/C IN 2-3 DAYS, DEPENDING ON CLINICAL CONDITION.   Epifanio Lesches M.D on 04/24/2016 at 8:48 AM  Between 7am to 6pm - Pager - [phone]  After 6pm go to [email or web address] - password [email or web address] [facility]  [place] [facility]  Office  [phone]  CC: Primary care physician; No PCP Per Patient

## 2016-04-24 NOTE — Consult Note (Signed)
Pharmacy Antibiotic Note  [name] is a 50 y.o. male admitted on 04/20/2016 with bacteremia.  Pharmacy has been consulted for vancomycin and Zosyn dosing. Per microbiology lab, GPC/CNS in at least 3 bottles with BCID from one bottle showing Staph species with methicillin resistance detected.  Plan: Vancomycin d/c due to possible contamination. After discussion with Dr. Verdell Carmine, will need to restart vancomycin with both sets of BCx growing GPC. Will continue vancomycin 500 mg iv q 24 hours and check a trough with the 4th dose.   Continue Zosyn 3.375 g EI q 8 hours. Consider d/c Zosyn with urine cultures negative and BCx growing GPC.    Height: 4\' 11"  (149.9 cm) Weight: 75 lb (34.02 kg) IBW/kg (Calculated) : 47.7  Temp (24hrs), Avg:98.2 F (36.8 C), Min:97.7 F (36.5 C), Max:98.6 F (37 C)   Recent Labs Lab 04/20/16 2120 04/20/16 2150 04/21/16 0055 04/22/16 0603 04/23/16 0442 04/24/16 0629 04/24/16 1916  WBC 20.3*  --   --  8.7 12.1* 9.7  --   CREATININE 1.12  --   --  0.97  --  0.76  --   LATICACIDVEN  --  2.2* 1.0  --   --   --   --   VANCOTROUGH  --   --   --   --   --   --  7*    Estimated Creatinine Clearance: 54.3 mL/min (by C-G formula based on Cr of 0.76).    Allergies  Allergen Reactions  . Ivp Dye [Iodinated Diagnostic Agents] Hives  . Latex   . Morphine And Related Nausea And Vomiting  . Sulfa Antibiotics     Antimicrobials this admission: vancomycin 7/18 >>  vancomycin 7/18 >>   Dose adjustments this admission: 7/22 19:30 Vanc trough resulted at 53mcg/ml. Will increase to Vancomycin 500mg  IV q12h.   Microbiology results: Recent Results (from the past 240 hour(s))  Urine culture     Status: Abnormal   Collection Time: 04/20/16  9:15 PM  Result Value Ref Range Status   Specimen Description URINE, RANDOM  Final   Special Requests Normal  Final   Culture MULTIPLE SPECIES PRESENT, SUGGEST RECOLLECTION (A)  Final   Report Status 04/22/2016 FINAL   Final  Blood Culture (routine x 2)     Status: Abnormal (Preliminary result)   Collection Time: 04/20/16  9:50 PM  Result Value Ref Range Status   Specimen Description BLOOD RIGHT ANTECUBITAL  Final   Special Requests   Final    BOTTLES DRAWN AEROBIC AND ANAEROBIC  4CCAERO,4CCANA   Culture  Setup Time   Final    GRAM POSITIVE COCCI ANAEROBIC BOTTLE ONLY CRITICAL VALUE NOTED.  VALUE IS CONSISTENT WITH PREVIOUSLY REPORTED AND CALLED VALUE. Performed at [place] (COAGULASE NEGATIVE) (A)  Final   Report Status PENDING  Incomplete   Organism ID, Bacteria STAPHYLOCOCCUS SPECIES (COAGULASE NEGATIVE)  Final      Susceptibility   Staphylococcus species (coagulase negative) - MIC*    CIPROFLOXACIN >=8 RESISTANT Resistant     ERYTHROMYCIN <=0.25 SENSITIVE Sensitive     GENTAMICIN <=0.5 SENSITIVE Sensitive     OXACILLIN Value in next row Sensitive      <=0.25 SENSITIVEWARNING: For oxacillin-resistant S.aureus and coagulase-negative staphylococci (MRS), other beta-lactam agents, ie, penicillins, beta-lactam/beta-lactamase inhibitor combinations, cephems (with the exception of the cephalosporins with anti-MRSA activity), and carbapenems, may appear active in vitro, but are not effective clinically.  --CLSI, Vol.32 No.3, January 2012, pg  70.    TETRACYCLINE Value in next row Sensitive      <=0.25 SENSITIVEWARNING: For oxacillin-resistant S.aureus and coagulase-negative staphylococci (MRS), other beta-lactam agents, ie, penicillins, beta-lactam/beta-lactamase inhibitor combinations, cephems (with the exception of the cephalosporins with anti-MRSA activity), and carbapenems, may appear active in vitro, but are not effective clinically.  --CLSI, Vol.32 No.3, January 2012, pg 70.    VANCOMYCIN Value in next row Sensitive      <=0.25 SENSITIVEWARNING: For oxacillin-resistant S.aureus and coagulase-negative staphylococci (MRS), other beta-lactam agents, ie,  penicillins, beta-lactam/beta-lactamase inhibitor combinations, cephems (with the exception of the cephalosporins with anti-MRSA activity), and carbapenems, may appear active in vitro, but are not effective clinically.  --CLSI, Vol.32 No.3, January 2012, pg 70.    TRIMETH/SULFA Value in next row Resistant      <=0.25 SENSITIVEWARNING: For oxacillin-resistant S.aureus and coagulase-negative staphylococci (MRS), other beta-lactam agents, ie, penicillins, beta-lactam/beta-lactamase inhibitor combinations, cephems (with the exception of the cephalosporins with anti-MRSA activity), and carbapenems, may appear active in vitro, but are not effective clinically.  --CLSI, Vol.32 No.3, January 2012, pg 70.    CLINDAMYCIN Value in next row Sensitive      <=0.25 SENSITIVEWARNING: For oxacillin-resistant S.aureus and coagulase-negative staphylococci (MRS), other beta-lactam agents, ie, penicillins, beta-lactam/beta-lactamase inhibitor combinations, cephems (with the exception of the cephalosporins with anti-MRSA activity), and carbapenems, may appear active in vitro, but are not effective clinically.  --CLSI, Vol.32 No.3, January 2012, pg 70.    RIFAMPIN Value in next row Sensitive      <=0.25 SENSITIVEWARNING: For oxacillin-resistant S.aureus and coagulase-negative staphylococci (MRS), other beta-lactam agents, ie, penicillins, beta-lactam/beta-lactamase inhibitor combinations, cephems (with the exception of the cephalosporins with anti-MRSA activity), and carbapenems, may appear active in vitro, but are not effective clinically.  --CLSI, Vol.32 No.3, January 2012, pg 70.    * STAPHYLOCOCCUS SPECIES (COAGULASE NEGATIVE)  Blood Culture (routine x 2)     Status: Abnormal (Preliminary result)   Collection Time: 04/20/16  9:50 PM  Result Value Ref Range Status   Specimen Description BLOOD LEFT ARM  Final   Special Requests   Final    BOTTLES DRAWN AEROBIC AND ANAEROBIC  5CCAERO, 4CCANA   Culture  Setup Time   Final     GRAM POSITIVE COCCI ANAEROBIC BOTTLE ONLY CRITICAL RESULT CALLED TO, READ BACK BY AND VERIFIED WITH: MELISSA Clarendon 04/21/16 1816 VKB    Culture (A)  Final    STAPHYLOCOCCUS SPECIES (COAGULASE NEGATIVE) SUSCEPTIBILITIES TO FOLLOW Performed at [facility]    Report Status PENDING  Incomplete  Blood Culture ID Panel (Reflexed)     Status: Abnormal   Collection Time: 04/20/16  9:50 PM  Result Value Ref Range Status   Enterococcus species NOT DETECTED NOT DETECTED Final   Vancomycin resistance NOT DETECTED NOT DETECTED Final   Listeria monocytogenes NOT DETECTED NOT DETECTED Final   Staphylococcus species DETECTED (A) NOT DETECTED Final    Comment: CRITICAL RESULT CALLED TO, READ BACK BY AND VERIFIED WITH: MELISSA MACCIA 04/21/16 1816 VKB    Staphylococcus aureus NOT DETECTED NOT DETECTED Final   Methicillin resistance DETECTED (A) NOT DETECTED Final    Comment: CRITICAL RESULT CALLED TO, READ BACK BY AND VERIFIED WITH: MELISSA MACCIA 04/21/16 1816 VKB    Streptococcus species NOT DETECTED NOT DETECTED Final   Streptococcus agalactiae NOT DETECTED NOT DETECTED Final   Streptococcus pneumoniae NOT DETECTED NOT DETECTED Final   Streptococcus pyogenes NOT DETECTED NOT DETECTED Final   Acinetobacter baumannii NOT DETECTED NOT DETECTED Final  Enterobacteriaceae species NOT DETECTED NOT DETECTED Final   Enterobacter cloacae complex NOT DETECTED NOT DETECTED Final   Escherichia coli NOT DETECTED NOT DETECTED Final   Klebsiella oxytoca NOT DETECTED NOT DETECTED Final   Klebsiella pneumoniae NOT DETECTED NOT DETECTED Final   Proteus species NOT DETECTED NOT DETECTED Final   Serratia marcescens NOT DETECTED NOT DETECTED Final   Carbapenem resistance NOT DETECTED NOT DETECTED Final   Haemophilus influenzae NOT DETECTED NOT DETECTED Final   Neisseria meningitidis NOT DETECTED NOT DETECTED Final   Pseudomonas aeruginosa NOT DETECTED NOT DETECTED Final   Candida albicans NOT DETECTED  NOT DETECTED Final   Candida glabrata NOT DETECTED NOT DETECTED Final   Candida krusei NOT DETECTED NOT DETECTED Final   Candida parapsilosis NOT DETECTED NOT DETECTED Final   Candida tropicalis NOT DETECTED NOT DETECTED Final  CULTURE, BLOOD (ROUTINE X 2) w Reflex to ID Panel     Status: None (Preliminary result)   Collection Time: 04/23/16 12:25 PM  Result Value Ref Range Status   Specimen Description BLOOD LEFT WRIST  Final   Special Requests BOTTLES DRAWN AEROBIC AND ANAEROBIC 4CCAERO,2CCANA  Final   Culture NO GROWTH < 24 HOURS  Final   Report Status PENDING  Incomplete  CULTURE, BLOOD (ROUTINE X 2) w Reflex to ID Panel     Status: None (Preliminary result)   Collection Time: 04/23/16 12:25 PM  Result Value Ref Range Status   Specimen Description BLOOD RIGHT HAND  Final   Special Requests BOTTLES DRAWN AEROBIC AND ANAEROBIC 6CCAERO,5CCANA  Final   Culture NO GROWTH < 24 HOURS  Final   Report Status PENDING  Incomplete     Thank you for allowing pharmacy to be a part of this patient's care.  Paulina Fusi, PharmD, BCPS 04/24/2016 8:18 PM

## 2016-04-25 LAB — CULTURE, BLOOD (ROUTINE X 2)

## 2016-04-25 MED ORDER — POLYETHYLENE GLYCOL 3350 17 G PO PACK
17.0000 g | PACK | Freq: Every day | ORAL | Status: DC | PRN
  Administered 2016-04-25: 17 g via ORAL
  Filled 2016-04-25 (×2): qty 1

## 2016-04-25 MED ORDER — ALUM & MAG HYDROXIDE-SIMETH 200-200-20 MG/5ML PO SUSP
30.0000 mL | ORAL | Status: DC | PRN
  Administered 2016-04-25: 30 mL via ORAL
  Filled 2016-04-25: qty 30

## 2016-04-25 NOTE — Progress Notes (Signed)
[place] at [place] NAME: Mounir Skipper    MR#:  [id number]  DATE OF BIRTH:  [birth year]  SUBJECTIVE: hiccups are better now.but  Has constipation.  No nausea. No abdominal pain. Repeat blood cultures are negative so far.    Pt. Here due to sepsis from UTI but urine cultures consistent w/ contamination. BC + Coag. (-) staph.  Hemodynamically stable.  Having hiccups and nausea.  REVIEW OF SYSTEMS:    Review of Systems  Constitutional: Negative for diaphoresis, fever and malaise/fatigue.  HENT: Negative for ear discharge and tinnitus.   Eyes: Negative for blurred vision, double vision and photophobia.  Respiratory: Negative for cough and hemoptysis.   Cardiovascular: Negative for chest pain and palpitations.  Gastrointestinal: Negative for heartburn, nausea and vomiting.  Genitourinary: Negative for dysuria and frequency.  Musculoskeletal: Negative for myalgias and neck pain.  Skin: Negative for itching and rash.  Neurological: Negative for dizziness and headaches.  Endo/Heme/Allergies: Does not bruise/bleed easily.  Psychiatric/Behavioral: Negative for depression.  All other systems reviewed and are negative.   Nutrition: Regular Tolerating Diet: Yes Tolerating PT: Bedbound   DRUG ALLERGIES:   Allergies  Allergen Reactions  . Ivp Dye [Iodinated Diagnostic Agents] Hives  . Latex   . Morphine And Related Nausea And Vomiting  . Sulfa Antibiotics     VITALS:  Blood pressure (!) 144/70, pulse 87, temperature 97.6 F (36.4 C), temperature source Axillary, resp. rate 20, height '4\' 11"'  (1.499 m), weight 33.6 kg (74 lb), SpO2 96 %.  PHYSICAL EXAMINATION:   Physical Exam  GENERAL:  50 y.o.-year-old patient lying in the bed inno acute distress. Constant hiccups. EYES: Pupils equal, round, reactive to light and accommodation. No scleral icterus. Extraocular muscles intact.  HEENT: Head atraumatic, normocephalic. Oropharynx and  nasopharynx clear.  NECK:  Supple, no jugular venous distention. No thyroid enlargement, no tenderness.  LUNGS: Normal breath sounds bilaterally, no wheezing, rales, rhonchi. No use of accessory muscles of respiration.  CARDIOVASCULAR: S1, S2 normal. No murmurs, rubs, or gallops.  ABDOMEN: Soft, nontender, nondistended. Bowel sounds present. No organomegaly or mass.  EXTREMITIES: No cyanosis, clubbing or edema b/l.  Legs contracted and with muscular atrophy due to hx of Spina Bifida.  NEUROLOGIC: Cranial nerves II through XII are intact. No focal Motor or sensory deficits b/l.  Bedbound, globally weak.  PSYCHIATRIC: The patient is alert and oriented x 3.  SKIN: No obvious rash, lesion, or ulcer.  Stage 1 decub. Ulcer.     LABORATORY PANEL:   CBC  Recent Labs Lab 04/24/16 0629  WBC 9.7  HGB 13.5  HCT 40.5  PLT 254   ------------------------------------------------------------------------------------------------------------------  Chemistries   Recent Labs Lab 04/20/16 2120 04/21/16 1134 04/22/16 0603 04/24/16 0629  NA 140  --  145  --   K 2.9*  --  4.8  --   CL 104  --  116*  --   CO2 23  --  24  --   GLUCOSE 123*  --  81  --   BUN 33*  --  20  --   CREATININE 1.12  --  0.97 0.76  CALCIUM 9.3  --  8.6*  --   MG 2.4 2.2  --   --   AST 23  --   --   --   ALT 14*  --   --   --   ALKPHOS 85  --   --   --   BILITOT  1.1  --   --   --    ------------------------------------------------------------------------------------------------------------------  Cardiac Enzymes No results for input(s): TROPONINI in the last 168 hours. ------------------------------------------------------------------------------------------------------------------  RADIOLOGY:  No results found.   ASSESSMENT AND PLAN:   50 year old male with past medical history of spina bifida, decubitus ulcers, who presents to the hospital due to weakness, nausea, vomiting and diarrhea and noted to have a  urinary tract infection.  1. Urinary tract infection- hx of Urostomy.  - will d/c Zosyn and monitor.   - afebrile, hemodynamically stable.   2. Sepsis-patient met criteria given his leukocytosis, fever and tachycardia. Has MRSA bacteremia. On IV vancomycin. Repeat blood cultures are negative., - source thought to be UTI.  No other obvious source.   initial Blood culture + for coagulase negative staph. This is in 2 bottles and likely significant.  - cont. Vancomycin. Blood cultures are negative,  echo showed EF more than 65%. No vegetations.   3. Diarrhea-and constipation is a chronic problem. Use stool softeners as needed.   4. Hiccups - cont. PRN Thorazine , Reglan, add IV PPIs. Finally they are improved. 5. Hypokalemia- improved w/ supplementation.  #6 .underweight;: Seen  by dietary,  continue ensure by mouth twice a day. #7. Moisture associated skin damage to sacrum, left and right gluteal area: Continue; foam dressing,  Low  loss mattress as per wound care recommendation.  sacral Presure  ulcer unstageable present on admission without gangrene.  All the records are reviewed and case discussed with Care Management/Social Workerr. Management plans discussed with the patient, family and they are in agreement.  CODE STATUS: Full  DVT Prophylaxis: Lovenox  TOTAL TIME TAKING CARE OF THIS PATIENT: 30 minutes.   POSSIBLE D/C IN 2-3 DAYS, DEPENDING ON CLINICAL CONDITION.   Epifanio Lesches M.D on 04/25/2016 at 11:18 AM  Between 7am to 6pm - Pager - [phone]  After 6pm go to [email or web address] - password [email or web address] [facility]  [facility]  Office  [phone]  CC: Primary care physician; No PCP Per Patient

## 2016-04-25 NOTE — Progress Notes (Signed)
Staffed with Dr. Vianne Bulls regarding pt's home bowel regimen with fleets enemas every other day, miralax daily prn, stool softener daily due to inability to evacuate his bowel actively. Advised she will assess and submit orders as desired.

## 2016-04-25 NOTE — Progress Notes (Signed)
Pt states feeling better with increase in meal intake noted. No co's nausea or hiccups offered. Verbalizes hopes to return home tomorrow. Miralax and stool softener given with no results thus far.

## 2016-04-26 LAB — CREATININE, SERUM
CREATININE: 0.76 mg/dL (ref 0.61–1.24)
GFR calc non Af Amer: >60 mL/min (ref >60)

## 2016-04-26 LAB — VANCOMYCIN, TROUGH: VANCOMYCIN TR: 18 ug/mL (ref 15–20)

## 2016-04-26 MED ORDER — CLINDAMYCIN HCL 150 MG PO CAPS
450.0000 mg | ORAL_CAPSULE | Freq: Four times a day (QID) | ORAL | Status: DC
  Administered 2016-04-26: 12:00:00 450 mg via ORAL
  Filled 2016-04-26 (×2): qty 3

## 2016-04-26 MED ORDER — CLINDAMYCIN HCL 150 MG PO CAPS: 450.0000 mg | ORAL_CAPSULE | Freq: Four times a day (QID) | ORAL | 0 refills | Status: DC

## 2016-04-26 MED ORDER — CLINDAMYCIN HCL 300 MG PO CAPS
450.0000 mg | ORAL_CAPSULE | Freq: Four times a day (QID) | ORAL | Status: DC
  Filled 2016-04-26: qty 1

## 2016-04-26 MED ORDER — ENSURE ENLIVE PO LIQD: 237.0000 mL | Freq: Two times a day (BID) | ORAL | 12 refills | Status: DC

## 2016-04-26 NOTE — Care Management Note (Signed)
Case Management Note  Patient Details  Name: [name] MRN: [id number] Date of Birth: 1966/06/21  Subjective/Objective:                  Spoke with patient at the bedside. He states that he is bed bound, lives at home with his step dad who is in a wheelchair. He states his primary caretaker is his sister [name] [phone]. Patient stated that he would like to use [facility] for [facility], as he has used them in the past. He states that if he needed to [name] to a SNF he would like to [name] to [facility]. Granted permission to speak with sister. [name] asked for home care 6-8 hours a day. She stated that she is in the process of getting PCS through Medicaid, and they have already sent a person come to the house to assess the situation. She was interested if CM could expedite this process and arrange this to start at DC. Discussed option on private duty care, [name] stated that would not financially be an option, and agreed to home health with [name]. No DME needed at this time.   Action/Plan:  Patient to DC today. CM verified patient to be transported by private car by sister who will come today after work ~5:00. [name] for [facility] services.  Expected Discharge Date:                  Expected Discharge Plan:  [place]  In-House Referral:     Discharge planning Services  CM Consult  Post Acute Care Choice:  Home Health Choice offered to:  Patient, Sibling  DME Arranged:  N/A DME Agency:  NA  HH Arranged:  RN, PT, OT, Nurse's Aide, Social Work [employer] Agency:  [employer] (now [facility])  Status of Service:  Completed, signed off  If discussed at [employer], dates discussed:    Additional Comments:  Chase Collet, RN 04/26/2016, 11:19 AM

## 2016-04-26 NOTE — Progress Notes (Signed)
Pt being discharged home with home health, discharge instructions reviewed with pt, states understanding, pt being transported via EMS, no complaints, no distress or discomfort noted at discharge

## 2016-04-26 NOTE — Progress Notes (Signed)
Pastoral Care and prayer provided.  °

## 2016-04-26 NOTE — Discharge Summary (Addendum)
[facility] [place] NAME: [name]    MR#:  [id number]  DATE OF BIRTH:  02-19-1966  DATE OF ADMISSION:  04/20/2016 ADMITTING PHYSICIAN: Harrie Foreman, MD  DATE OF DISCHARGE: 04/26/16  PRIMARY CARE PHYSICIAN: No PCP Per Patient    ADMISSION DIAGNOSIS:  Paraplegia ([place]) [G82.20] UTI (lower urinary tract infection) [N39.0] Urinary stoma complication ([place]) [I50.388] Sepsis, due to unspecified organism ([place]) [A41.9]  DISCHARGE DIAGNOSIS:  Coagulase Staphylococcus Aureus septicemia UTI Bed bound due to spina bifida-chronic  SECONDARY DIAGNOSIS:   Past Medical History:  Diagnosis Date  . Decubitus ulcer   . Renal disorder   . Spina bifida [facility])     HOSPITAL COURSE:   50 year old male with past medical history of spina bifida, decubitus ulcers, who presents to the hospital due to weakness, nausea, vomiting and diarrhea and noted to have a urinary tract infection.  1. Urinary tract infection- hx of Urostomy.  - will d/c Zosyn and monitor.   - afebrile, hemodynamically stable.  -UC multiple species  2. Sepsis-patient met criteria given his leukocytosis, fever and tachycardia. Has Staph coagulase bacteremia. -was on IV vancomycin. Repeat blood cultures are negative. -switch to oral clindamycin (per lab sensitivity) - source thought to be UTI.  No other obvious source.  - initial Blood culture + for coagulase negative staph. This is in 2 bottles and likely significant.  -  echo showed EF more than 65%. No vegetations.   3. Diarrhea-and constipation is a chronic problem. Use stool softeners as needed.   4. Hiccups - cont. PRN Thorazine,Reglan, add IV PPIs. Finally resolved  5. Hypokalemia- improved w/ supplementation.   #6 .underweight;: Seen  by dietary,  continue ensure by mouth twice a day.  #7.Chronic pressure ulcer - skin damage to sacrum, left and right gluteal area: Continue; foam dressing,  Low   loss mattress as per wound care recommendation.  Overall improved. D/c back to home with [facility] D/w pt and agreeable CONSULTS OBTAINED:    DRUG ALLERGIES:   Allergies  Allergen Reactions  . Ivp Dye [Iodinated Diagnostic Agents] Hives  . Latex   . Morphine And Related Nausea And Vomiting  . Sulfa Antibiotics     DISCHARGE MEDICATIONS:   Current Discharge Medication List    START taking these medications   Details  clindamycin (CLEOCIN) 150 MG capsule Take 3 capsules (450 mg total) by mouth every 6 (six) hours. Qty: 60 capsule, Refills: 0    feeding supplement, ENSURE ENLIVE, (ENSURE ENLIVE) LIQD Take 237 mLs by mouth 2 (two) times daily between meals. Qty: 237 mL, Refills: 12      CONTINUE these medications which have NOT CHANGED   Details  docusate sodium (COLACE) 100 MG capsule Take 100 mg by mouth 2 (two) times daily as needed for mild constipation.    oxyCODONE (OXY IR/ROXICODONE) 5 MG immediate release tablet Take 5 mg by mouth every 4 (four) hours as needed for moderate pain or severe pain.        If you experience worsening of your admission symptoms, develop shortness of breath, life threatening emergency, suicidal or homicidal thoughts you must seek medical attention immediately by calling 911 or calling your MD immediately  if symptoms less severe.  You Must read complete instructions/literature along with all the possible adverse reactions/side effects for all the Medicines you take and that have been prescribed to you. Take any new Medicines after you have completely understood and accept  all the possible adverse reactions/side effects.   Please note  You were cared for by a hospitalist during your hospital stay. If you have any questions about your discharge medications or the care you received while you were in the hospital after you are discharged, you can call the unit and asked to speak with the hospitalist on call if the hospitalist that took care of you is  not available. Once you are discharged, your primary care physician will handle any further medical issues. Please note that NO REFILLS for any discharge medications will be authorized once you are discharged, as it is imperative that you return to your primary care physician (or establish a relationship with a primary care physician if you do not have one) for your aftercare needs so that they can reassess your need for medications and monitor your lab values. Today   SUBJECTIVE   Doing well  VITAL SIGNS:  Blood pressure 118/83, pulse 86, temperature 98.3 F (36.8 C), temperature source Axillary, resp. rate 16, height '4\' 11"'  (1.499 m), weight 34.9 kg (77 lb), SpO2 96 %.  I/O:   Intake/Output Summary (Last 24 hours) at 04/26/16 0952 Last data filed at 04/26/16 0901  Gross per 24 hour  Intake             2067 ml  Output             1775 ml  Net              292 ml    PHYSICAL EXAMINATION:  GENERAL:  50 y.o.-year-old patient lying in the bed with no acute distress.  EYES: Pupils equal, round, reactive to light and accommodation. No scleral icterus. Extraocular muscles intact.  HEENT: Head atraumatic, normocephalic. Oropharynx and nasopharynx clear.  NECK:  Supple, no jugular venous distention. No thyroid enlargement, no tenderness.  LUNGS: Normal breath sounds bilaterally, no wheezing, rales,rhonchi or crepitation. No use of accessory muscles of respiration.  CARDIOVASCULAR: S1, S2 normal. No murmurs, rubs, or gallops.  ABDOMEN: Soft, non-tender, non-distended. Bowel sounds present. No organomegaly or mass.  EXTREMITIES: No pedal edema, cyanosis, or clubbing.  NEUROLOGIC: Cranial nerves II through XII are intact. Chronic mild contractures. PSYCHIATRIC: The patient is alert and oriented x 3.  SKIN: chronic pressure ulcer.   DATA REVIEW:   CBC   Recent Labs Lab 04/24/16 0629  WBC 9.7  HGB 13.5  HCT 40.5  PLT 254    Chemistries   Recent Labs Lab 04/20/16 2120  04/21/16 1134 04/22/16 0603  04/26/16 0738  NA 140  --  145  --   --   K 2.9*  --  4.8  --   --   CL 104  --  116*  --   --   CO2 23  --  24  --   --   GLUCOSE 123*  --  81  --   --   BUN 33*  --  20  --   --   CREATININE 1.12  --  0.97  < > 0.76  CALCIUM 9.3  --  8.6*  --   --   MG 2.4 2.2  --   --   --   AST 23  --   --   --   --   ALT 14*  --   --   --   --   ALKPHOS 85  --   --   --   --   BILITOT 1.1  --   --   --   --   < > =  values in this interval not displayed.  Microbiology Results   Recent Results (from the past 240 hour(s))  Urine culture     Status: Abnormal   Collection Time: 04/20/16  9:15 PM  Result Value Ref Range Status   Specimen Description URINE, RANDOM  Final   Special Requests Normal  Final   Culture MULTIPLE SPECIES PRESENT, SUGGEST RECOLLECTION (A)  Final   Report Status 04/22/2016 FINAL  Final  Blood Culture (routine x 2)     Status: Abnormal   Collection Time: 04/20/16  9:50 PM  Result Value Ref Range Status   Specimen Description BLOOD RIGHT ANTECUBITAL  Final   Special Requests   Final    BOTTLES DRAWN AEROBIC AND ANAEROBIC  4CCAERO,4CCANA   Culture  Setup Time   Final    GRAM POSITIVE COCCI ANAEROBIC BOTTLE ONLY CRITICAL VALUE NOTED.  VALUE IS CONSISTENT WITH PREVIOUSLY REPORTED AND CALLED VALUE.    Culture (A)  Final    STAPHYLOCOCCUS SPECIES (COAGULASE NEGATIVE) SUSCEPTIBILITIES PERFORMED ON PREVIOUS CULTURE WITHIN THE LAST 5 DAYS. Performed at [facility]    Report Status 04/25/2016 FINAL  Final   Organism ID, Bacteria STAPHYLOCOCCUS SPECIES (COAGULASE NEGATIVE)  Final      Susceptibility   Staphylococcus species (coagulase negative) - MIC*    CIPROFLOXACIN >=8 RESISTANT Resistant     ERYTHROMYCIN <=0.25 SENSITIVE Sensitive     GENTAMICIN <=0.5 SENSITIVE Sensitive     OXACILLIN Value in next row Sensitive      <=0.25 SENSITIVEWARNING: For oxacillin-resistant S.aureus and coagulase-negative staphylococci (MRS), other  beta-lactam agents, ie, penicillins, beta-lactam/beta-lactamase inhibitor combinations, cephems (with the exception of the cephalosporins with anti-MRSA activity), and carbapenems, may appear active in vitro, but are not effective clinically.  --CLSI, Vol.32 No.3, January 2012, pg 70.    TETRACYCLINE Value in next row Sensitive      <=0.25 SENSITIVEWARNING: For oxacillin-resistant S.aureus and coagulase-negative staphylococci (MRS), other beta-lactam agents, ie, penicillins, beta-lactam/beta-lactamase inhibitor combinations, cephems (with the exception of the cephalosporins with anti-MRSA activity), and carbapenems, may appear active in vitro, but are not effective clinically.  --CLSI, Vol.32 No.3, January 2012, pg 70.    VANCOMYCIN Value in next row Sensitive      <=0.25 SENSITIVEWARNING: For oxacillin-resistant S.aureus and coagulase-negative staphylococci (MRS), other beta-lactam agents, ie, penicillins, beta-lactam/beta-lactamase inhibitor combinations, cephems (with the exception of the cephalosporins with anti-MRSA activity), and carbapenems, may appear active in vitro, but are not effective clinically.  --CLSI, Vol.32 No.3, January 2012, pg 70.    TRIMETH/SULFA Value in next row Resistant      <=0.25 SENSITIVEWARNING: For oxacillin-resistant S.aureus and coagulase-negative staphylococci (MRS), other beta-lactam agents, ie, penicillins, beta-lactam/beta-lactamase inhibitor combinations, cephems (with the exception of the cephalosporins with anti-MRSA activity), and carbapenems, may appear active in vitro, but are not effective clinically.  --CLSI, Vol.32 No.3, January 2012, pg 70.    CLINDAMYCIN Value in next row Sensitive      <=0.25 SENSITIVEWARNING: For oxacillin-resistant S.aureus and coagulase-negative staphylococci (MRS), other beta-lactam agents, ie, penicillins, beta-lactam/beta-lactamase inhibitor combinations, cephems (with the exception of the cephalosporins with anti-MRSA activity), and  carbapenems, may appear active in vitro, but are not effective clinically.  --CLSI, Vol.32 No.3, January 2012, pg 70.    RIFAMPIN Value in next row Sensitive      <=0.25 SENSITIVEWARNING: For oxacillin-resistant S.aureus and coagulase-negative staphylococci (MRS), other beta-lactam agents, ie, penicillins, beta-lactam/beta-lactamase inhibitor combinations, cephems (with the exception of the cephalosporins with anti-MRSA activity), and carbapenems, may appear active in vitro,  but are not effective clinically.  --CLSI, Vol.32 No.3, January 2012, pg 70.    * STAPHYLOCOCCUS SPECIES (COAGULASE NEGATIVE)  Blood Culture (routine x 2)     Status: Abnormal   Collection Time: 04/20/16  9:50 PM  Result Value Ref Range Status   Specimen Description BLOOD LEFT ARM  Final   Special Requests   Final    BOTTLES DRAWN AEROBIC AND ANAEROBIC  5CCAERO, 4CCANA   Culture  Setup Time   Final    GRAM POSITIVE COCCI ANAEROBIC BOTTLE ONLY CRITICAL RESULT CALLED TO, READ BACK BY AND VERIFIED WITH: MELISSA Albion 04/21/16 1816 VKB    Culture STAPHYLOCOCCUS SPECIES (COAGULASE NEGATIVE) (A)  Final   Report Status 04/25/2016 FINAL  Final   Organism ID, Bacteria STAPHYLOCOCCUS SPECIES (COAGULASE NEGATIVE)  Final      Susceptibility   Staphylococcus species (coagulase negative) - MIC*    CIPROFLOXACIN <=0.5 SENSITIVE Sensitive     ERYTHROMYCIN >=8 RESISTANT Resistant     GENTAMICIN <=0.5 SENSITIVE Sensitive     OXACILLIN Value in next row Sensitive      <=0.25 SENSITIVEWARNING: For oxacillin-resistant S.aureus and coagulase-negative staphylococci (MRS), other beta-lactam agents, ie, penicillins, beta-lactam/beta-lactamase inhibitor combinations, cephems (with the exception of the cephalosporins with anti-MRSA activity), and carbapenems, may appear active in vitro, but are not effective clinically.  --CLSI, Vol.32 No.3, January 2012, pg 70.    TETRACYCLINE Value in next row Sensitive      <=0.25 SENSITIVEWARNING: For  oxacillin-resistant S.aureus and coagulase-negative staphylococci (MRS), other beta-lactam agents, ie, penicillins, beta-lactam/beta-lactamase inhibitor combinations, cephems (with the exception of the cephalosporins with anti-MRSA activity), and carbapenems, may appear active in vitro, but are not effective clinically.  --CLSI, Vol.32 No.3, January 2012, pg 70.    VANCOMYCIN Value in next row Sensitive      <=0.25 SENSITIVEWARNING: For oxacillin-resistant S.aureus and coagulase-negative staphylococci (MRS), other beta-lactam agents, ie, penicillins, beta-lactam/beta-lactamase inhibitor combinations, cephems (with the exception of the cephalosporins with anti-MRSA activity), and carbapenems, may appear active in vitro, but are not effective clinically.  --CLSI, Vol.32 No.3, January 2012, pg 70.    TRIMETH/SULFA Value in next row Sensitive      <=0.25 SENSITIVEWARNING: For oxacillin-resistant S.aureus and coagulase-negative staphylococci (MRS), other beta-lactam agents, ie, penicillins, beta-lactam/beta-lactamase inhibitor combinations, cephems (with the exception of the cephalosporins with anti-MRSA activity), and carbapenems, may appear active in vitro, but are not effective clinically.  --CLSI, Vol.32 No.3, January 2012, pg 70.    CLINDAMYCIN Value in next row Sensitive      <=0.25 SENSITIVEWARNING: For oxacillin-resistant S.aureus and coagulase-negative staphylococci (MRS), other beta-lactam agents, ie, penicillins, beta-lactam/beta-lactamase inhibitor combinations, cephems (with the exception of the cephalosporins with anti-MRSA activity), and carbapenems, may appear active in vitro, but are not effective clinically.  --CLSI, Vol.32 No.3, January 2012, pg 70.    RIFAMPIN Value in next row Sensitive      <=0.25 SENSITIVEWARNING: For oxacillin-resistant S.aureus and coagulase-negative staphylococci (MRS), other beta-lactam agents, ie, penicillins, beta-lactam/beta-lactamase inhibitor combinations, cephems  (with the exception of the cephalosporins with anti-MRSA activity), and carbapenems, may appear active in vitro, but are not effective clinically.  --CLSI, Vol.32 No.3, January 2012, pg 70.    * STAPHYLOCOCCUS SPECIES (COAGULASE NEGATIVE)  Blood Culture ID Panel (Reflexed)     Status: Abnormal   Collection Time: 04/20/16  9:50 PM  Result Value Ref Range Status   Enterococcus species NOT DETECTED NOT DETECTED Final   Vancomycin resistance NOT DETECTED NOT DETECTED Final   Listeria monocytogenes NOT  DETECTED NOT DETECTED Final   Staphylococcus species DETECTED (A) NOT DETECTED Final    Comment: CRITICAL RESULT CALLED TO, READ BACK BY AND VERIFIED WITH: MELISSA MACCIA 04/21/16 1816 VKB    Staphylococcus aureus NOT DETECTED NOT DETECTED Final   Methicillin resistance DETECTED (A) NOT DETECTED Final    Comment: CRITICAL RESULT CALLED TO, READ BACK BY AND VERIFIED WITH: MELISSA MACCIA 04/21/16 1816 VKB    Streptococcus species NOT DETECTED NOT DETECTED Final   Streptococcus agalactiae NOT DETECTED NOT DETECTED Final   Streptococcus pneumoniae NOT DETECTED NOT DETECTED Final   Streptococcus pyogenes NOT DETECTED NOT DETECTED Final   Acinetobacter baumannii NOT DETECTED NOT DETECTED Final   Enterobacteriaceae species NOT DETECTED NOT DETECTED Final   Enterobacter cloacae complex NOT DETECTED NOT DETECTED Final   Escherichia coli NOT DETECTED NOT DETECTED Final   Klebsiella oxytoca NOT DETECTED NOT DETECTED Final   Klebsiella pneumoniae NOT DETECTED NOT DETECTED Final   Proteus species NOT DETECTED NOT DETECTED Final   Serratia marcescens NOT DETECTED NOT DETECTED Final   Carbapenem resistance NOT DETECTED NOT DETECTED Final   Haemophilus influenzae NOT DETECTED NOT DETECTED Final   Neisseria meningitidis NOT DETECTED NOT DETECTED Final   Pseudomonas aeruginosa NOT DETECTED NOT DETECTED Final   Candida albicans NOT DETECTED NOT DETECTED Final   Candida glabrata NOT DETECTED NOT DETECTED  Final   Candida krusei NOT DETECTED NOT DETECTED Final   Candida parapsilosis NOT DETECTED NOT DETECTED Final   Candida tropicalis NOT DETECTED NOT DETECTED Final  CULTURE, BLOOD (ROUTINE X 2) w Reflex to ID Panel     Status: None (Preliminary result)   Collection Time: 04/23/16 12:25 PM  Result Value Ref Range Status   Specimen Description BLOOD LEFT WRIST  Final   Special Requests BOTTLES DRAWN AEROBIC AND ANAEROBIC 4CCAERO,2CCANA  Final   Culture NO GROWTH 2 DAYS  Final   Report Status PENDING  Incomplete  CULTURE, BLOOD (ROUTINE X 2) w Reflex to ID Panel     Status: None (Preliminary result)   Collection Time: 04/23/16 12:25 PM  Result Value Ref Range Status   Specimen Description BLOOD RIGHT HAND  Final   Special Requests BOTTLES DRAWN AEROBIC AND ANAEROBIC 6CCAERO,5CCANA  Final   Culture NO GROWTH 2 DAYS  Final   Report Status PENDING  Incomplete    RADIOLOGY:  No results found.   Management plans discussed with the patient, family and they are in agreement.  CODE STATUS:     Code Status Orders        Start     Ordered   04/21/16 0239  Full code  Continuous     04/21/16 0238    Code Status History    Date Active Date Inactive Code Status Order ID Comments User Context   This patient has a current code status but no historical code status.      TOTAL TIME TAKING CARE OF THIS PATIENT: 40 minutes.    Edwardo Wojnarowski M.D on 04/26/2016 at 9:52 AM  Between 7am to 6pm - Pager - [phone] After 6pm go to [email or web address] - password [email or web address] [facility]  [facility]  Office  [phone]  CC: Primary care physician; No PCP Per Patient

## 2016-04-26 NOTE — Care Management Important Message (Signed)
Important Message  Patient Details  Name: [name] MRN: [id number] Date of Birth: Apr 19, 1966   Medicare Important Message Given:  Yes    [name], RN 04/26/2016, 11:24 AM

## 2016-04-28 LAB — CULTURE, BLOOD (ROUTINE X 2)
CULTURE: NO GROWTH
Culture: NO GROWTH

## 2016-06-02 ENCOUNTER — Inpatient Hospital Stay
Admission: EM | Admit: 2016-06-02 | Discharge: 2016-06-08 | DRG: 388 | Disposition: A | Discharge status: Home Health | Payer: Medicare Other | Attending: Internal Medicine | Admitting: Internal Medicine

## 2016-06-02 ENCOUNTER — Emergency Department: Payer: Medicare Other

## 2016-06-02 DIAGNOSIS — Z936 Other artificial openings of urinary tract status: Secondary | ICD-10-CM

## 2016-06-02 DIAGNOSIS — Q059 Spina bifida, unspecified: Secondary | ICD-10-CM

## 2016-06-02 DIAGNOSIS — Z882 Allergy status to sulfonamides status: Secondary | ICD-10-CM

## 2016-06-02 DIAGNOSIS — Z885 Allergy status to narcotic agent status: Secondary | ICD-10-CM

## 2016-06-02 DIAGNOSIS — E870 Hyperosmolality and hypernatremia: Secondary | ICD-10-CM | POA: Diagnosis present

## 2016-06-02 DIAGNOSIS — R110 Nausea: Secondary | ICD-10-CM

## 2016-06-02 DIAGNOSIS — L89893 Pressure ulcer of other site, stage 3: Secondary | ICD-10-CM | POA: Diagnosis present

## 2016-06-02 DIAGNOSIS — G8250 Quadriplegia, unspecified: Secondary | ICD-10-CM | POA: Diagnosis present

## 2016-06-02 DIAGNOSIS — R197 Diarrhea, unspecified: Secondary | ICD-10-CM

## 2016-06-02 DIAGNOSIS — K5641 Fecal impaction: Principal | ICD-10-CM | POA: Diagnosis present

## 2016-06-02 DIAGNOSIS — E860 Dehydration: Secondary | ICD-10-CM | POA: Diagnosis present

## 2016-06-02 DIAGNOSIS — Z91041 Radiographic dye allergy status: Secondary | ICD-10-CM

## 2016-06-02 DIAGNOSIS — L8990 Pressure ulcer of unspecified site, unspecified stage: Secondary | ICD-10-CM | POA: Insufficient documentation

## 2016-06-02 DIAGNOSIS — E872 Acidosis: Secondary | ICD-10-CM | POA: Diagnosis present

## 2016-06-02 DIAGNOSIS — N179 Acute kidney failure, unspecified: Secondary | ICD-10-CM | POA: Diagnosis present

## 2016-06-02 DIAGNOSIS — R140 Abdominal distension (gaseous): Secondary | ICD-10-CM

## 2016-06-02 DIAGNOSIS — Z982 Presence of cerebrospinal fluid drainage device: Secondary | ICD-10-CM

## 2016-06-02 DIAGNOSIS — E876 Hypokalemia: Secondary | ICD-10-CM | POA: Diagnosis present

## 2016-06-02 DIAGNOSIS — R112 Nausea with vomiting, unspecified: Secondary | ICD-10-CM | POA: Diagnosis not present

## 2016-06-02 DIAGNOSIS — K311 Adult hypertrophic pyloric stenosis: Secondary | ICD-10-CM | POA: Diagnosis present

## 2016-06-02 DIAGNOSIS — K2970 Gastritis, unspecified, without bleeding: Secondary | ICD-10-CM | POA: Diagnosis present

## 2016-06-02 DIAGNOSIS — K2211 Ulcer of esophagus with bleeding: Secondary | ICD-10-CM

## 2016-06-02 DIAGNOSIS — R6510 Systemic inflammatory response syndrome (SIRS) of non-infectious origin without acute organ dysfunction: Secondary | ICD-10-CM | POA: Diagnosis present

## 2016-06-02 DIAGNOSIS — Z7401 Bed confinement status: Secondary | ICD-10-CM

## 2016-06-02 DIAGNOSIS — K567 Ileus, unspecified: Secondary | ICD-10-CM

## 2016-06-02 LAB — COMPREHENSIVE METABOLIC PANEL
ALBUMIN: 4.2 g/dL (ref 3.5–5.0)
ALT: 18 U/L (ref 17–63)
ANION GAP: 17 — AB (ref 5–15)
AST: 32 U/L (ref 15–41)
Alkaline Phosphatase: 76 U/L (ref 38–126)
BUN: 23 mg/dL — ABNORMAL HIGH (ref 6–20)
CHLORIDE: 108 mmol/L (ref 101–111)
CO2: 15 mmol/L — AB (ref 22–32)
Calcium: 9 mg/dL (ref 8.9–10.3)
Creatinine, Ser: 1.14 mg/dL (ref 0.61–1.24)
GFR calc non Af Amer: >60 mL/min (ref >60)
GLUCOSE: 69 mg/dL (ref 65–99)
Potassium: 3.5 mmol/L (ref 3.5–5.1)
SODIUM: 140 mmol/L (ref 135–145)
Total Bilirubin: 1.4 mg/dL — ABNORMAL HIGH (ref 0.3–1.2)
Total Protein: 8.2 g/dL — ABNORMAL HIGH (ref 6.5–8.1)

## 2016-06-02 LAB — CBC WITH DIFFERENTIAL/PLATELET
BASOS ABS: 0.1 10*3/uL (ref 0–0.1)
BASOS PCT: 0 %
Eosinophils Absolute: 0 10*3/uL (ref 0–0.7)
Eosinophils Relative: 0 %
HEMATOCRIT: 47.8 % (ref 40.0–52.0)
Hemoglobin: 15.9 g/dL (ref 13.0–18.0)
LYMPHS ABS: 0.6 10*3/uL — AB (ref 1.0–3.6)
LYMPHS PCT: 4 %
MCH: 28.5 pg (ref 26.0–34.0)
MCHC: 33.3 g/dL (ref 32.0–36.0)
MCV: 85.6 fL (ref 80.0–100.0)
MONOS PCT: 5 %
Monocytes Absolute: 0.8 10*3/uL (ref 0.2–1.0)
NEUTROS ABS: 15.2 10*3/uL — AB (ref 1.4–6.5)
NEUTROS PCT: 91 %
PLATELETS: 361 10*3/uL (ref 150–440)
RBC: 5.58 MIL/uL (ref 4.40–5.90)
RDW: 14.6 % — AB (ref 11.5–14.5)
WBC: 16.8 10*3/uL — ABNORMAL HIGH (ref 3.8–10.6)

## 2016-06-02 LAB — LIPASE, BLOOD: LIPASE: 22 U/L (ref 11–51)

## 2016-06-02 MED ORDER — ONDANSETRON HCL 4 MG/2ML IJ SOLN
4.0000 mg | Freq: Once | INTRAMUSCULAR | Status: DC | PRN
  Filled 2016-06-02 (×3): qty 2

## 2016-06-02 MED ORDER — ONDANSETRON HCL 4 MG/2ML IJ SOLN
4.0000 mg | Freq: Once | INTRAMUSCULAR | Status: AC
  Administered 2016-06-02: 4 mg via INTRAVENOUS

## 2016-06-02 MED ORDER — SODIUM CHLORIDE 0.9 % IV SOLN
Freq: Once | INTRAVENOUS | Status: AC
  Administered 2016-06-02: 22:00:00 via INTRAVENOUS

## 2016-06-02 NOTE — ED Notes (Signed)
Pt informed of need for urine sample for UA. Pt states he hasn't been able to keep down much fluid over the last 2 days and is unable to urinate at this time. Pt states he will notify nursing staff when he feels the need/ability to void.

## 2016-06-02 NOTE — ED Provider Notes (Signed)
[facility] Emergency Department Provider Note   ____________________________________________   First MD Initiated Contact with Patient 06/02/16 2145     (approximate)  I have reviewed the triage vital signs and the nursing notes.   HISTORY  Chief Complaint Abdominal Pain; Nausea; Emesis; and Diarrhea    HPI Yaniv Eppert is a 50 y.o. male with spina bifida. Patient reports he was began having diarrhea early in the week. He is now having nausea and vomiting and abdominal pain running across his abdomen as well. He has not had no blood in the vomit or the diarrhea. He does report he somewhat lightheaded if he sits up. Almost any action all even opening his mouth to show me his tongue makes him have dry heaves. Patient says he is not now taking antibiotics although he had the clindamycin last month.   Past Medical History:  Diagnosis Date  . Decubitus ulcer   . Renal disorder   . Spina bifida [facility])     Patient Active Problem List   Diagnosis Date Noted  . Sepsis ([place]) 04/21/2016    Past Surgical History:  Procedure Laterality Date  . ABDOMINAL SURGERY    . BACK SURGERY    . COLON SURGERY    . ilieostomy    . VENTRICULOPERITONEAL SHUNT    . vp shunt removal      Prior to Admission medications   Medication Sig Start Date End Date Taking? Authorizing Provider  clindamycin (CLEOCIN) 150 MG capsule Take 3 capsules (450 mg total) by mouth every 6 (six) hours. 04/26/16   Fritzi Mandes, MD  docusate sodium (COLACE) 100 MG capsule Take 100 mg by mouth 2 (two) times daily as needed for mild constipation.    Historical Provider, MD  feeding supplement, ENSURE ENLIVE, (ENSURE ENLIVE) LIQD Take 237 mLs by mouth 2 (two) times daily between meals. 04/26/16   Fritzi Mandes, MD  oxyCODONE (OXY IR/ROXICODONE) 5 MG immediate release tablet Take 5 mg by mouth every 4 (four) hours as needed for moderate pain or severe pain.    Historical Provider, MD     Allergies Ivp dye [iodinated diagnostic agents]; Latex; Morphine and related; and Sulfa antibiotics  Family History  Problem Relation Age of Onset  . Diabetes Mellitus II Father     Social History Social History  Substance Use Topics  . Smoking status: Never Smoker  . Smokeless tobacco: Never Used  . Alcohol use No    Review of Systems Constitutional: No fever/chills Eyes: No visual changes. ENT: No sore throat. Cardiovascular: Denies chest pain. Respiratory: Denies shortness of breath. Gastrointestinal:See history of present illness Genitourinary: Negative for dysuria. Musculoskeletal: Negative for back pain. Skin: Negative for rash.  10-point ROS otherwise negative.  ____________________________________________   PHYSICAL EXAM:  VITAL SIGNS: ED Triage Vitals  Enc Vitals Group     BP 06/02/16 2110 (!) 128/99     Pulse Rate 06/02/16 2110 (!) 122     Resp 06/02/16 2110 16     Temp 06/02/16 2110 (!) 96.8 F (36 C)     Temp Source 06/02/16 2110 Axillary     SpO2 06/02/16 2103 95 %     Weight 06/02/16 2110 93 lb 1.6 oz (42.2 kg)     Height 06/02/16 2110 4\' 11"  (1.499 m)     Head Circumference --      Peak Flow --      Pain Score 06/02/16 2111 6     Pain Loc --  Pain Edu? --      Excl. in [place]? --    Constitutional: Alert and oriented.  Eyes: Conjunctivae are normal. PERRL. EOMI. Head: Atraumatic. Nose: No congestion/rhinnorhea. Mouth/Throat: Mucous membranes are moist.  Oropharynx non-erythematous. Neck: No stridor.   Cardiovascular: Tachycardic, regular rhythm. Grossly normal heart sounds.  Good peripheral circulation. Respiratory: Normal respiratory effort.  No retractions. Lungs CTAB. Gastrointestinal: Soft minimally tender somewhat distended decreased bowel sounds    ____________________________________________   LABS (all labs ordered are listed, but only abnormal results are displayed)  Labs Reviewed  COMPREHENSIVE METABOLIC PANEL -  Abnormal; Notable for the following:       Result Value   CO2 15 (*)    BUN 23 (*)    Total Protein 8.2 (*)    Total Bilirubin 1.4 (*)    Anion gap 17 (*)    All other components within normal limits  CBC WITH DIFFERENTIAL/PLATELET - Abnormal; Notable for the following:    WBC 16.8 (*)    RDW 14.6 (*)    Neutro Abs 15.2 (*)    Lymphs Abs 0.6 (*)    All other components within normal limits  LIPASE, BLOOD  URINALYSIS COMPLETEWITH MICROSCOPIC ([facility] ONLY)   ____________________________________________  EKG  EKG read and interpreted by me shows sinus tachycardia at 123 normal axis nonspecific ST-T wave changes ____________________________________________  RADIOLOGY  Study Result   CLINICAL DATA:  Diarrhea since Monday. Nausea and vomiting and abdominal pain starting today. History of ileostomy and spina bifida. Allergic to IV contrast material and patient fell unable to tolerate oral contrast material.  EXAM: CT ABDOMEN AND PELVIS WITHOUT CONTRAST  TECHNIQUE: Multidetector CT imaging of the abdomen and pelvis was performed following the standard protocol without IV contrast.  COMPARISON:  04/20/2016  FINDINGS: Lower chest: Atelectasis in the lung bases. Small esophageal hiatal hernia. Fluid-filled distal esophagus may be due to reflux or dysmotility  Hepatobiliary: No mass visualized on this un-enhanced exam.  Pancreas: No mass or inflammatory process identified on this un-enhanced exam.  Spleen: Within normal limits in size.  Adrenals/Urinary Tract: No adrenal gland nodules. Kidneys demonstrate multiple intrarenal stones, measuring up to 2 cm diameter in the left kidney. Bilateral hydronephrosis and hydroureter. No definite ureteral stones. Scarring in both kidneys. Right lower quadrant urostomy. Renal changes appear chronic and are similar to previous study.  Stomach/Bowel: Stomach is diffusely distended with decompressed small bowel. Gastric  outlet obstruction is not excluded. No gastric wall thickening is appreciated. There is massive dilatation of the rectosigmoid colon and less prominent dilatation of the remainder the colon. Large amount of impacted stool is in the rectosigmoid colon. The rectum measures up to 13.9 cm in diameter. Mildly increased colonic distention since prior study. No colonic wall thickening, pneumatosis, or other signs of perforation although at this diameter, there is an increased risk of perforation.  Vascular/Lymphatic: No pathologically enlarged lymph nodes. No evidence of abdominal aortic aneurysm.  Reproductive: No mass or other significant abnormality.  Other: No free air or free fluid in the abdomen.  Musculoskeletal: Changes consistent with history of spina bifida with diffuse history of present throughout the visualized lumbar and sacral spine. 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 Left hip dysplasia with bilateral hip effusions. Superior subluxation of the left hip with associated deformity of the acetabulum and femoral head.  IMPRESSION: Markedly distended rectosigmoid colon and mild distention of the remainder of the colon with large amount of likely impacted stool throughout  the rectosigmoid colon. Changes are progressed since previous study. Stomach is distended and fluid filled possibly indicating gastric outlet obstruction. Small bowel are decompressed. Chronic calcifications and chronic hydronephrosis and hydroureter both kidneys with right lower quadrant ileal conduit present. Skeletal changes consistent with history of spina bifida.   Electronically Signed   By: Lucienne Capers M.D.   On: 06/02/2016 22:57    ____________________________________________   PROCEDURES  Procedure(s) performed:  Procedures  Critical Care performed:  ____________________________________________   INITIAL IMPRESSION /  ASSESSMENT AND PLAN / ED COURSE  Pertinent labs & imaging results that were available during my care of the patient were reviewed by me and considered in my medical decision making (see chart for details).   Clinical Course   Dr. Owens Shark will follow-up UA which has not been done. The patient is able to tolerate by mouth and significant get him to be less tachycardic. This patient may be admitted.  ____________________________________________   FINAL CLINICAL IMPRESSION(S) / ED DIAGNOSES  Final diagnoses:  Nausea vomiting and diarrhea      NEW MEDICATIONS STARTED DURING THIS VISIT:  New Prescriptions   No medications on file     Note:  This document was prepared using Dragon voice recognition software and may include unintentional dictation errors.    Nena Polio, MD 06/02/16 [phone]

## 2016-06-02 NOTE — ED Notes (Signed)
Patient transported to CT 

## 2016-06-02 NOTE — ED Triage Notes (Signed)
Pt arrives to ED via [facility] from home with c/o diarrhea since Monday and N/V and ABD pain that started today. Pt is A&O, in NAD, with respirations even, regular, and unlabored. Pt reports emesis "a few times a day" with diarrhea "a couple times a day".

## 2016-06-02 NOTE — ED Notes (Signed)
2 unsuccessful PIV attempts by this RN. 

## 2016-06-03 ENCOUNTER — Encounter: Payer: Self-pay | Admitting: Internal Medicine

## 2016-06-03 ENCOUNTER — Inpatient Hospital Stay: Payer: Medicare Other | Admitting: Anesthesiology

## 2016-06-03 ENCOUNTER — Encounter
Admission: EM | Disposition: A | Discharge status: Home Health | Payer: Self-pay | Source: Home / Self Care | Attending: Specialist

## 2016-06-03 DIAGNOSIS — K5649 Other impaction of intestine: Secondary | ICD-10-CM | POA: Insufficient documentation

## 2016-06-03 DIAGNOSIS — K2211 Ulcer of esophagus with bleeding: Secondary | ICD-10-CM | POA: Diagnosis not present

## 2016-06-03 DIAGNOSIS — E860 Dehydration: Secondary | ICD-10-CM | POA: Diagnosis present

## 2016-06-03 DIAGNOSIS — K311 Adult hypertrophic pyloric stenosis: Secondary | ICD-10-CM | POA: Diagnosis not present

## 2016-06-03 DIAGNOSIS — L8990 Pressure ulcer of unspecified site, unspecified stage: Secondary | ICD-10-CM | POA: Insufficient documentation

## 2016-06-03 DIAGNOSIS — E872 Acidosis: Secondary | ICD-10-CM | POA: Diagnosis present

## 2016-06-03 DIAGNOSIS — R112 Nausea with vomiting, unspecified: Secondary | ICD-10-CM

## 2016-06-03 DIAGNOSIS — G8250 Quadriplegia, unspecified: Secondary | ICD-10-CM | POA: Diagnosis present

## 2016-06-03 DIAGNOSIS — N179 Acute kidney failure, unspecified: Secondary | ICD-10-CM | POA: Diagnosis present

## 2016-06-03 DIAGNOSIS — Z7401 Bed confinement status: Secondary | ICD-10-CM | POA: Diagnosis not present

## 2016-06-03 DIAGNOSIS — E876 Hypokalemia: Secondary | ICD-10-CM | POA: Diagnosis present

## 2016-06-03 DIAGNOSIS — R6510 Systemic inflammatory response syndrome (SIRS) of non-infectious origin without acute organ dysfunction: Secondary | ICD-10-CM | POA: Diagnosis present

## 2016-06-03 DIAGNOSIS — K5641 Fecal impaction: Secondary | ICD-10-CM | POA: Diagnosis present

## 2016-06-03 DIAGNOSIS — R110 Nausea: Secondary | ICD-10-CM

## 2016-06-03 DIAGNOSIS — Q059 Spina bifida, unspecified: Secondary | ICD-10-CM | POA: Diagnosis not present

## 2016-06-03 DIAGNOSIS — K2970 Gastritis, unspecified, without bleeding: Secondary | ICD-10-CM | POA: Diagnosis not present

## 2016-06-03 DIAGNOSIS — E870 Hyperosmolality and hypernatremia: Secondary | ICD-10-CM | POA: Diagnosis present

## 2016-06-03 DIAGNOSIS — Z91041 Radiographic dye allergy status: Secondary | ICD-10-CM | POA: Diagnosis not present

## 2016-06-03 DIAGNOSIS — Z982 Presence of cerebrospinal fluid drainage device: Secondary | ICD-10-CM | POA: Diagnosis not present

## 2016-06-03 DIAGNOSIS — R1110 Vomiting, unspecified: Secondary | ICD-10-CM

## 2016-06-03 DIAGNOSIS — R197 Diarrhea, unspecified: Secondary | ICD-10-CM

## 2016-06-03 DIAGNOSIS — L89893 Pressure ulcer of other site, stage 3: Secondary | ICD-10-CM | POA: Diagnosis present

## 2016-06-03 DIAGNOSIS — Z885 Allergy status to narcotic agent status: Secondary | ICD-10-CM | POA: Diagnosis not present

## 2016-06-03 DIAGNOSIS — Z882 Allergy status to sulfonamides status: Secondary | ICD-10-CM | POA: Diagnosis not present

## 2016-06-03 DIAGNOSIS — Z936 Other artificial openings of urinary tract status: Secondary | ICD-10-CM | POA: Diagnosis not present

## 2016-06-03 HISTORY — PX: ESOPHAGOGASTRODUODENOSCOPY (EGD) WITH PROPOFOL: SHX5813

## 2016-06-03 HISTORY — DX: Systemic inflammatory response syndrome (sirs) of non-infectious origin without acute organ dysfunction: R65.10

## 2016-06-03 HISTORY — DX: Nausea with vomiting, unspecified: R11.2

## 2016-06-03 HISTORY — DX: Adult hypertrophic pyloric stenosis: K31.1

## 2016-06-03 LAB — URINALYSIS COMPLETE WITH MICROSCOPIC (ARMC ONLY)
BILIRUBIN URINE: NEGATIVE
Bacteria, UA: NONE SEEN
GLUCOSE, UA: NEGATIVE mg/dL
Hgb urine dipstick: NEGATIVE
KETONES UR: NEGATIVE mg/dL
NITRITE: NEGATIVE
Protein, ur: NEGATIVE mg/dL
SPECIFIC GRAVITY, URINE: 1.015 (ref 1.005–1.030)
Squamous Epithelial / LPF: NONE SEEN
pH: 7 (ref 5.0–8.0)

## 2016-06-03 LAB — BASIC METABOLIC PANEL
Anion gap: 14 (ref 5–15)
BUN: 24 mg/dL — AB (ref 6–20)
CALCIUM: 8.5 mg/dL — AB (ref 8.9–10.3)
CHLORIDE: 112 mmol/L — AB (ref 101–111)
CO2: 18 mmol/L — AB (ref 22–32)
CREATININE: 1.33 mg/dL — AB (ref 0.61–1.24)
GFR calc Af Amer: >60 mL/min (ref >60)
GFR calc non Af Amer: >60 mL/min (ref >60)
Glucose, Bld: 80 mg/dL (ref 65–99)
Potassium: 3.5 mmol/L (ref 3.5–5.1)
SODIUM: 144 mmol/L (ref 135–145)

## 2016-06-03 LAB — CBC
HCT: 44.5 % (ref 40.0–52.0)
HEMOGLOBIN: 14.6 g/dL (ref 13.0–18.0)
MCH: 28.5 pg (ref 26.0–34.0)
MCHC: 32.7 g/dL (ref 32.0–36.0)
MCV: 86.9 fL (ref 80.0–100.0)
Platelets: 298 10*3/uL (ref 150–440)
RBC: 5.12 MIL/uL (ref 4.40–5.90)
RDW: 15 % — AB (ref 11.5–14.5)
WBC: 10.5 10*3/uL (ref 3.8–10.6)

## 2016-06-03 LAB — LACTIC ACID, PLASMA
LACTIC ACID, VENOUS: 1.1 mmol/L (ref 0.5–1.9)
LACTIC ACID, VENOUS: 1.4 mmol/L (ref 0.5–1.9)

## 2016-06-03 SURGERY — ESOPHAGOGASTRODUODENOSCOPY (EGD) WITH PROPOFOL
Anesthesia: Monitor Anesthesia Care

## 2016-06-03 SURGERY — ESOPHAGOGASTRODUODENOSCOPY (EGD) WITH PROPOFOL
Anesthesia: General

## 2016-06-03 MED ORDER — SODIUM CHLORIDE 0.9% FLUSH
3.0000 mL | Freq: Two times a day (BID) | INTRAVENOUS | Status: DC
  Administered 2016-06-04 – 2016-06-08 (×7): 3 mL via INTRAVENOUS

## 2016-06-03 MED ORDER — ACETAMINOPHEN 650 MG RE SUPP: 650.0000 mg | Freq: Four times a day (QID) | RECTAL | Status: DC | PRN

## 2016-06-03 MED ORDER — CEFTRIAXONE SODIUM 1 G IJ SOLR
1.0000 g | INTRAMUSCULAR | Status: DC
  Filled 2016-06-03: qty 10

## 2016-06-03 MED ORDER — ONDANSETRON HCL 4 MG PO TABS: 4.0000 mg | ORAL_TABLET | Freq: Four times a day (QID) | ORAL | Status: DC | PRN

## 2016-06-03 MED ORDER — PROPOFOL 10 MG/ML IV BOLUS
INTRAVENOUS | Status: DC | PRN
  Administered 2016-06-03: 100 mg via INTRAVENOUS

## 2016-06-03 MED ORDER — DEXTROSE 5 % IV SOLN
1.0000 g | Freq: Once | INTRAVENOUS | Status: AC
Start: 2016-06-03 — End: 2016-06-03
  Administered 2016-06-03: 1 g via INTRAVENOUS
  Filled 2016-06-03: qty 10

## 2016-06-03 MED ORDER — ACETAMINOPHEN 325 MG PO TABS: 650.0000 mg | ORAL_TABLET | Freq: Four times a day (QID) | ORAL | Status: DC | PRN

## 2016-06-03 MED ORDER — FENTANYL CITRATE (PF) 100 MCG/2ML IJ SOLN: 25.0000 ug | INTRAMUSCULAR | Status: DC | PRN

## 2016-06-03 MED ORDER — DEXTROSE 5 % IV SOLN
1.0000 g | INTRAVENOUS | Status: DC
  Filled 2016-06-03 (×2): qty 10

## 2016-06-03 MED ORDER — PROMETHAZINE HCL 25 MG/ML IJ SOLN
12.5000 mg | Freq: Once | INTRAMUSCULAR | Status: AC
  Administered 2016-06-03: 12.5 mg via INTRAVENOUS
  Filled 2016-06-03: qty 1

## 2016-06-03 MED ORDER — ENOXAPARIN SODIUM 40 MG/0.4ML ~~LOC~~ SOLN
40.0000 mg | Freq: Every day | SUBCUTANEOUS | Status: DC
  Filled 2016-06-03: qty 0.4

## 2016-06-03 MED ORDER — SODIUM CHLORIDE 0.9 % IR SOLN
1000.0000 mL | Freq: Once | Status: AC
  Administered 2016-06-03: 1000 mL

## 2016-06-03 MED ORDER — ENOXAPARIN SODIUM 30 MG/0.3ML ~~LOC~~ SOLN
30.0000 mg | Freq: Every day | SUBCUTANEOUS | Status: DC
  Filled 2016-06-03: qty 0.3

## 2016-06-03 MED ORDER — FLEET ENEMA 7-19 GM/118ML RE ENEM
1.0000 | ENEMA | Freq: Once | RECTAL | Status: AC
  Administered 2016-06-03: 1 via RECTAL

## 2016-06-03 MED ORDER — SODIUM CHLORIDE 0.9 % IV SOLN
INTRAVENOUS | Status: DC | PRN
  Administered 2016-06-03: 18:00:00 via INTRAVENOUS

## 2016-06-03 MED ORDER — ONDANSETRON HCL 4 MG/2ML IJ SOLN
4.0000 mg | Freq: Four times a day (QID) | INTRAMUSCULAR | Status: DC | PRN
  Administered 2016-06-04 – 2016-06-06 (×5): 4 mg via INTRAVENOUS
  Filled 2016-06-03 (×3): qty 2

## 2016-06-03 MED ORDER — POTASSIUM CHLORIDE IN NACL 20-0.9 MEQ/L-% IV SOLN
INTRAVENOUS | Status: DC
  Administered 2016-06-03 – 2016-06-04 (×3): via INTRAVENOUS
  Filled 2016-06-03 (×5): qty 1000

## 2016-06-03 MED ORDER — SUCCINYLCHOLINE CHLORIDE 20 MG/ML IJ SOLN
INTRAMUSCULAR | Status: DC | PRN
  Administered 2016-06-03: 60 mg via INTRAVENOUS

## 2016-06-03 MED ORDER — ONDANSETRON HCL 4 MG/2ML IJ SOLN: 4.0000 mg | Freq: Once | INTRAMUSCULAR | Status: DC | PRN

## 2016-06-03 MED ORDER — PANTOPRAZOLE SODIUM 40 MG PO TBEC
40.0000 mg | DELAYED_RELEASE_TABLET | Freq: Two times a day (BID) | ORAL | Status: DC
  Administered 2016-06-04 – 2016-06-08 (×7): 40 mg via ORAL
  Filled 2016-06-03 (×8): qty 1

## 2016-06-03 MED ORDER — FAMOTIDINE IN NACL 20-0.9 MG/50ML-% IV SOLN
20.0000 mg | Freq: Two times a day (BID) | INTRAVENOUS | Status: DC
  Administered 2016-06-03 – 2016-06-08 (×10): 20 mg via INTRAVENOUS
  Filled 2016-06-03 (×11): qty 50

## 2016-06-03 MED ORDER — KETOROLAC TROMETHAMINE 15 MG/ML IJ SOLN: 15.0000 mg | Freq: Four times a day (QID) | INTRAMUSCULAR | Status: DC | PRN

## 2016-06-03 NOTE — Transfer of Care (Signed)
Immediate Anesthesia Transfer of Care Note  Patient: [name]  Procedure(s) Performed: Procedure(s): ESOPHAGOGASTRODUODENOSCOPY (EGD) WITH PROPOFOL (N/A)  Patient Location: PACU  Anesthesia Type:General  Level of Consciousness: awake  Airway & Oxygen Therapy: Patient Spontanous Breathing and Patient connected to face mask oxygen  Post-op Assessment: Report given to RN and Post -op Vital signs reviewed and stable  Post vital signs: Reviewed  Last Vitals:  Vitals:   06/03/16 1707 06/03/16 1759  BP: (!) 159/88 132/84  Pulse: (!) 101 (!) 107  Resp: 16 16  Temp:  37.2 C    Last Pain:  Vitals:   06/03/16 1122  TempSrc: Oral  PainSc:          Complications: No apparent anesthesia complications

## 2016-06-03 NOTE — Progress Notes (Signed)
Pt refusing to be turned on his side due to severe nausea. Nursing staff unable to visualize back half to complete skin assessment. Pt also refusing to answer any questions. Pt stating " I do not feel like answering questions right now, I am nauseous". Pt unable to get anything else for nausea at this moment. Pt HOB elevated at 30 degrees, emesis bag at bedside with pt. Pt currently sleeping. Will continue to monitor.   [name]

## 2016-06-03 NOTE — Progress Notes (Signed)
Cardiac monitor discontinued per Md orders.  Fleet enema administered, will monitor pt

## 2016-06-03 NOTE — Care Management Note (Signed)
Case Management Note  Patient Details  Name: [name] MRN: [id number] Date of Birth: Dec 01, 1965  Subjective/Objective:           Care manager has made several attempts this day to speak with patient.  On one occasion he was getting a treatment and the other he was sleeping.  Patient has spina bifida and has been vomiting.  Had recent admission /dsicahrge in July 2017 and appears may have had a home health referral to [name]. Attempting to confirm.  Also on that admission it was documented that a medicaid PCS application was in progress. It appears sx may be due to large volume of stool in the colon.  Action/Plan:   Reached out to [name].  Awaiting call back.   REached out to [facility] for status of pcs applications.  Was  accepted by [employer]  8/18 and letter sent to patient 8/21.  "aide should have or should be starting".  No information on the number of hours.  [phone])  Expected Discharge Date:   24-48 hours               Expected Discharge Plan:   Home health- new or resumption.  In-House Referral:     Discharge planning Services     Post Acute Care Choice:    Choice offered to:     DME Arranged:    DME Agency:     HH Arranged:    HH Agency:     Status of Service:     If discussed at [employer] of Stay Meetings, dates discussed:    Additional Comments:  Katrina Stack, RN 06/03/2016, 2:55 PM

## 2016-06-03 NOTE — Consult Note (Signed)
[place] Nurse wound consult note Reason for Consult: pressure ulcer Wound type: rash to right foot Pressure Ulcer POA:  Measurement: 9 x 7 x 0 Wound bed: erythematous macular papular rash Drainage (amount, consistency, odor) scant serous drainage, no odor Periwound: pale, intact Dressing procedure/placement/frequency: Cover with foam dressing until seen by dermatology or ID.  Change prn soilage.  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  Pack with saline moistened gauze and cover with ABD and paper tape.  Change every 8 hours. If only denuded skin as per initial nurse assessment, cover with silicone bordered foam every 3 days.  Off load pt from sacrum.   Pt refused assessment of back/buttocks.  Pt  Also stated "I don't feel like talking" and was unable to answer any questions regarding right foot and prior treatment.  Recommend dermatology or ID to assess macular papular rash to right foot.  Discussed POC with patient and bedside nurse.  Re consult if needed, will not follow at this time. Thanks Dorna Bloom BSN, RN, [facility]

## 2016-06-03 NOTE — Plan of Care (Signed)
Problem: Safety: Goal: Ability to remain free from injury will improve Outcome: Progressing Fall precautions in place  Problem: Physical Regulation: Goal: Ability to maintain clinical measurements within normal limits will improve Outcome: Progressing Bed bound/wc bound at baseline  Problem: Tissue Perfusion: Goal: Risk factors for ineffective tissue perfusion will decrease Outcome: Progressing SQ Lovenox  Problem: Bowel/Gastric: Goal: Will not experience complications related to bowel motility Outcome: Not Progressing Enema given

## 2016-06-03 NOTE — Progress Notes (Signed)
Pharmacy Antibiotic Note  [name] is a 50 y.o. male admitted on 06/02/2016 with UTI.  Pharmacy has been consulted for ceftriaxone dosing.  Plan: Ceftriaxone 1 gm IV Q24H  Height: 4\' 11"  (149.9 cm) Weight: 93 lb 1.6 oz (42.2 kg) IBW/kg (Calculated) : 47.7  Temp (24hrs), Avg:96.8 F (36 C), Min:96.8 F (36 C), Max:96.8 F (36 C)   Recent Labs Lab 06/02/16 2203 06/03/16 0250  WBC 16.8*  --   CREATININE 1.14  --   LATICACIDVEN  --  1.1    Estimated Creatinine Clearance: 47.3 mL/min (by C-G formula based on SCr of 1.14 mg/dL).    Allergies  Allergen Reactions  . Ivp Dye [Iodinated Diagnostic Agents] Hives  . Morphine And Related Nausea And Vomiting  . Sulfa Antibiotics Other (See Comments)    Reaction: unknown  . Latex Rash    Thank you for allowing pharmacy to be a part of this patient's care.  Laural Benes, Pharm.D., BCPS Clinical Pharmacist 06/03/2016 3:38 AM

## 2016-06-03 NOTE — Anesthesia Procedure Notes (Signed)
Procedure Name: Intubation Performed by: Rolla Plate Pre-anesthesia Checklist: Patient identified, Patient being monitored, Timeout performed, Emergency Drugs available and Suction available Patient Re-evaluated:Patient Re-evaluated prior to inductionOxygen Delivery Method: Circle system utilized Preoxygenation: Pre-oxygenation with 100% oxygen Intubation Type: IV induction, Rapid sequence and Cricoid Pressure applied Laryngoscope Size: 3 and McGraph Grade View: Grade I Tube type: Oral Tube size: 7.0 mm Number of attempts: 1 Airway Equipment and Method: Stylet Placement Confirmation: ETT inserted through vocal cords under direct vision,  positive ETCO2 and breath sounds checked- equal and bilateral Secured at: 21 cm Tube secured with: Tape Dental Injury: Teeth and Oropharynx as per pre-operative assessment

## 2016-06-03 NOTE — H&P (Signed)
[facility] [place] NAME: [name]    MR#:  [id number]  DATE OF BIRTH:  [birth year]  DATE OF ADMISSION:  06/02/2016  PRIMARY CARE PHYSICIAN: No PCP Per Patient   REQUESTING/REFERRING PHYSICIAN:   CHIEF COMPLAINT:   Chief Complaint  Patient presents with  . Abdominal Pain  . Nausea  . Emesis  . Diarrhea    HISTORY OF PRESENT ILLNESS: [name]  is a 50 y.o. male with a known history of Spina bifida renal insufficiency, decubitus ulcer presented to the emergency room with abdominal pain nausea and vomiting. Nausea and vomiting has been present for the last few days. Vomitus contains food and water. No history of hematemesis or hemoptysis. Abdominal pain is located around the umbilical area dilating in nature 3 out of 10 on a scale of 1-10. Patient has a urostomy. No history of any fever or chills. And patient presented to the emergency room he had elevated heart rate. Patient was worked up with a CT abdomen in the emergency room which showed a large amount of stool in the rectosigmoid area and also gastric outlet obstruction. Surgical consultation was ordered by ER physician for evaluation of gastric outlet obstruction. Patient was given IV Rocephin antibiotic in the emergency room for urinary tract infection. Also manual disimpaction for the stool in the rectosigmoid area was attempted by ER physician. No history of any chest pain. No history of any shortness of breath. Patient has history of spina bifida and dependent on activities of daily living.  PAST MEDICAL HISTORY:   Past Medical History:  Diagnosis Date  . Decubitus ulcer   . Renal disorder   . Spina bifida ([place])     PAST SURGICAL HISTORY: Past Surgical History:  Procedure Laterality Date  . ABDOMINAL SURGERY    . BACK SURGERY    . COLON SURGERY    . ilieostomy    . VENTRICULOPERITONEAL SHUNT    . vp shunt removal      SOCIAL HISTORY:  Social History   Substance Use Topics  . Smoking status: Never Smoker  . Smokeless tobacco: Never Used  . Alcohol use No    FAMILY HISTORY:  Family History  Problem Relation Age of Onset  . Diabetes Mellitus II Father     DRUG ALLERGIES:  Allergies  Allergen Reactions  . Ivp Dye [Iodinated Diagnostic Agents] Hives  . Latex   . Morphine And Related Nausea And Vomiting  . Sulfa Antibiotics     REVIEW OF SYSTEMS:   CONSTITUTIONAL: No fever, has weakness.  EYES: No blurred or double vision.  EARS, NOSE, AND THROAT: No tinnitus or ear pain.  RESPIRATORY: No cough, shortness of breath, wheezing or hemoptysis.  CARDIOVASCULAR: No chest pain, orthopnea, edema.  GASTROINTESTINAL: Has nausea, vomiting, and abdominal pain.  On and off constipation GENITOURINARY: No hematuria. Has urostomy ENDOCRINE: No polyuria, nocturia,  HEMATOLOGY: No anemia, easy bruising or bleeding SKIN: No rash  MUSCULOSKELETAL: No joint pain or arthritis.   NEUROLOGIC: No tingling, numbness, weakness.  PSYCHIATRY: No anxiety or depression.   MEDICATIONS AT HOME:  Prior to Admission medications   Medication Sig Start Date End Date Taking? Authorizing Provider  clindamycin (CLEOCIN) 150 MG capsule Take 3 capsules (450 mg total) by mouth every 6 (six) hours. 04/26/16   Fritzi Mandes, MD  docusate sodium (COLACE) 100 MG capsule Take 100 mg by mouth 2 (two) times daily as needed for mild constipation.  Historical Provider, MD  feeding supplement, ENSURE ENLIVE, (ENSURE ENLIVE) LIQD Take 237 mLs by mouth 2 (two) times daily between meals. 04/26/16   Fritzi Mandes, MD  oxyCODONE (OXY IR/ROXICODONE) 5 MG immediate release tablet Take 5 mg by mouth every 4 (four) hours as needed for moderate pain or severe pain.    Historical Provider, MD      PHYSICAL EXAMINATION:   VITAL SIGNS: Blood pressure 132/82, pulse (!) 105, temperature (!) 96.8 F (36 C), temperature source Axillary, resp. rate 16, height 4\' 11"  (1.499 m), weight 42.2 kg  (93 lb 1.6 oz), SpO2 95 %.  GENERAL:  50 y.o.-year-old patient lying in the bed in mild distress secondary to nausea and vomiting EYES: Pupils equal, round, reactive to light and accommodation. No scleral icterus. Extraocular muscles intact.  HEENT: Head atraumatic, normocephalic. Oropharynx dry and nasopharynx clear.  NECK:  Supple, no jugular venous distention. No thyroid enlargement, no tenderness.  LUNGS: Normal breath sounds bilaterally, no wheezing, rales,rhonchi or crepitation. No use of accessory muscles of respiration.  CARDIOVASCULAR: S1, S2 tachycardia. No murmurs, rubs, or gallops.  ABDOMEN: Soft, mild tenderness around umbilicus, nondistended. Bowel sounds present. No organomegaly or mass.  Urostomy noted EXTREMITIES: No pedal edema, cyanosis, or clubbing.  NEUROLOGIC: Cranial nerves II through XII are intact. Has spina bifida Lower extremity weakness secondary to spina bifida PSYCHIATRIC: The patient is alert and oriented x 3.  SKIN: No obvious rash  LABORATORY PANEL:   CBC  Recent Labs Lab 06/02/16 2203  WBC 16.8*  HGB 15.9  HCT 47.8  PLT 361  MCV 85.6  MCH 28.5  MCHC 33.3  RDW 14.6*  LYMPHSABS 0.6*  MONOABS 0.8  EOSABS 0.0  BASOSABS 0.1   ------------------------------------------------------------------------------------------------------------------  Chemistries   Recent Labs Lab 06/02/16 2203  NA 140  K 3.5  CL 108  CO2 15*  GLUCOSE 69  BUN 23*  CREATININE 1.14  CALCIUM 9.0  AST 32  ALT 18  ALKPHOS 76  BILITOT 1.4*   ------------------------------------------------------------------------------------------------------------------ estimated creatinine clearance is 47.3 mL/min (by C-G formula based on SCr of 1.14 mg/dL). ------------------------------------------------------------------------------------------------------------------ No results for input(s): TSH, T4TOTAL, T3FREE, THYROIDAB in the last 72 hours.  Invalid input(s):  FREET3   Coagulation profile No results for input(s): INR, PROTIME in the last 168 hours. ------------------------------------------------------------------------------------------------------------------- No results for input(s): DDIMER in the last 72 hours. -------------------------------------------------------------------------------------------------------------------  Cardiac Enzymes No results for input(s): CKMB, TROPONINI, MYOGLOBIN in the last 168 hours.  Invalid input(s): CK ------------------------------------------------------------------------------------------------------------------ Invalid input(s): POCBNP  ---------------------------------------------------------------------------------------------------------------  Urinalysis    Component Value Date/Time   COLORURINE YELLOW (A) 06/03/2016 0021   APPEARANCEUR CLEAR (A) 06/03/2016 0021   LABSPEC 1.015 06/03/2016 0021   PHURINE 7.0 06/03/2016 0021   GLUCOSEU NEGATIVE 06/03/2016 0021   HGBUR NEGATIVE 06/03/2016 0021   BILIRUBINUR NEGATIVE 06/03/2016 0021   KETONESUR NEGATIVE 06/03/2016 0021   PROTEINUR NEGATIVE 06/03/2016 0021   NITRITE NEGATIVE 06/03/2016 0021   LEUKOCYTESUR TRACE (A) 06/03/2016 0021     RADIOLOGY: Ct Abdomen Pelvis Wo Contrast  Result Date: 06/02/2016 CLINICAL DATA:  Diarrhea since Monday. Nausea and vomiting and abdominal pain starting today. History of ileostomy and spina bifida. Allergic to IV contrast material and patient fell unable to tolerate oral contrast material. EXAM: CT ABDOMEN AND PELVIS WITHOUT CONTRAST TECHNIQUE: Multidetector CT imaging of the abdomen and pelvis was performed following the standard protocol without IV contrast. COMPARISON:  04/20/2016 FINDINGS: Lower chest: Atelectasis in the lung bases. Small esophageal hiatal hernia. Fluid-filled distal esophagus  may be due to reflux or dysmotility Hepatobiliary: No mass visualized on this un-enhanced exam. Pancreas: No mass or  inflammatory process identified on this un-enhanced exam. Spleen: Within normal limits in size. Adrenals/Urinary Tract: No adrenal gland nodules. Kidneys demonstrate multiple intrarenal stones, measuring up to 2 cm diameter in the left kidney. Bilateral hydronephrosis and hydroureter. No definite ureteral stones. Scarring in both kidneys. Right lower quadrant urostomy. Renal changes appear chronic and are similar to previous study. Stomach/Bowel: Stomach is diffusely distended with decompressed small bowel. Gastric outlet obstruction is not excluded. No gastric wall thickening is appreciated. There is massive dilatation of the rectosigmoid colon and less prominent dilatation of the remainder the colon. Large amount of impacted stool is in the rectosigmoid colon. The rectum measures up to 13.9 cm in diameter. Mildly increased colonic distention since prior study. No colonic wall thickening, pneumatosis, or other signs of perforation although at this diameter, there is an increased risk of perforation. Vascular/Lymphatic: No pathologically enlarged lymph nodes. No evidence of abdominal aortic aneurysm. Reproductive: No mass or other significant abnormality. Other: No free air or free fluid in the abdomen. Musculoskeletal: Changes consistent with history of spina bifida with diffuse history of present throughout the visualized lumbar and sacral spine. 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 Left hip dysplasia with bilateral hip effusions. Superior subluxation of the left hip with associated deformity of the acetabulum and femoral head. IMPRESSION: Markedly distended rectosigmoid colon and mild distention of the remainder of the colon with large amount of likely impacted stool throughout the rectosigmoid colon. Changes are progressed since previous study. Stomach is distended and fluid filled possibly indicating gastric outlet obstruction. Small bowel are decompressed.  Chronic calcifications and chronic hydronephrosis and hydroureter both kidneys with right lower quadrant ileal conduit present. Skeletal changes consistent with history of spina bifida. Electronically Signed   By: Lucienne Capers M.D.   On: 06/02/2016 22:57    EKG: Orders placed or performed during the hospital encounter of 06/02/16  . ED EKG  . EKG 12-Lead  . EKG 12-Lead  . ED EKG    IMPRESSION AND PLAN: 50 year old male patient with history of spina bifida, decubitus ulcer presented to the emergency room with nausea and vomiting and abdominal discomfort. Workup in the emergency room showed a large amount of stool in the rectosigmoid area and the gastric outlet obstruction. Admitting diagnosis 1. Systemic inflammatory response syndrome 2. Urinary tract infection 3. Hypokalemia 4. Dehydration 5. Intractable nausea vomiting 6. Gastric outlet obstruction 7. Constipation 8. Spina bifida  Treatment plan 1. Admit patient to telemetry inpatient service 2. IV Rocephin antibiotic 1 g daily 3. Check lactate level and urine culture 4. IV fluids 5. Antiemetic medication 6. Replace potassium 7. Surgical consultation 8. DVT prophylaxis with subcutaneous Lovenox 40 MG daily 9. Supportive care.  All the records are reviewed and case discussed with ED provider. Management plans discussed with the patient, family and they are in agreement.  CODE STATUS:FULL Code Status History    Date Active Date Inactive Code Status Order ID Comments User Context   04/21/2016  2:38 AM 04/26/2016  6:15 PM Full Code [id number]  Harrie Foreman, MD Inpatient    Advance Directive Documentation   Flowsheet Row Most Recent Value  Type of Advance Directive  Healthcare Power of Attorney  Pre-existing out of facility DNR order (yellow form or pink MOST form)  No data  "MOST" Form in Place?  No data  TOTAL TIME TAKING CARE OF THIS PATIENT: 52 minutes.    Saundra Shelling M.D on 06/03/2016 at 3:08  AM  Between 7am to 6pm - Pager - [phone]  After 6pm go to [email or web address] - password [email or web address] [facility]  [facility]  Office  [phone]  CC: Primary care physician; No PCP Per Patient

## 2016-06-03 NOTE — Care Management (Addendum)
Found that patient is being followed by Tretha Sciara PT OT SW.  Would be of benefit to have aide service added while waiting for the medicaid pcs service to start.  Patient has also been put on the medicaid cap waiting list.  Sister [name] arrived and CM was able to speak with both.  Patient does have issues with bowel elimination and discussed need for bowel regime and diet.  Patient is essentially bed bound.  Patient does not like vegetables.  He eats a lot of sandwiches because that is what he likes.  Says he drinks plenty of fluids.  Likes fruits but does not  eat very often because it just is not available.  Home health SN should now also focus on bowel program.  Patient is current with his PCP at [facility].

## 2016-06-03 NOTE — Progress Notes (Signed)
Pharmacy will adjust enoxaparin to 30mg  Q24hr due to patients weight of 42.6kg.   Loree Fee, PharmD Clinical Pharmacist 4:05 PM 06/03/2016

## 2016-06-03 NOTE — Progress Notes (Signed)
[facility] [place] NAME: [name]    MR#:  [id number]  DATE OF BIRTH:  Jan 08, 1966  SUBJECTIVE:   Patient is here due to nausea vomiting and thought to have a urinary tract infection. Patient's CT scan on admission showed possible gastric outlet obstruction and his urinalysis was positive. Presently patient complaining of some nausea and dyspepsia.  REVIEW OF SYSTEMS:    Review of Systems  Constitutional: Negative for chills and fever.  HENT: Negative for congestion and tinnitus.   Eyes: Negative for blurred vision and double vision.  Respiratory: Negative for cough, shortness of breath and wheezing.   Cardiovascular: Negative for chest pain, orthopnea and PND.  Gastrointestinal: Positive for abdominal pain, nausea and vomiting. Negative for diarrhea.  Genitourinary: Negative for dysuria and hematuria.  Neurological: Negative for dizziness, sensory change and focal weakness.  All other systems reviewed and are negative.   Nutrition: Clear liquids Tolerating Diet: Little Tolerating PT: pt. bedbound due to Spina-bifida  DRUG ALLERGIES:   Allergies  Allergen Reactions  . Ivp Dye [Iodinated Diagnostic Agents] Hives  . Morphine And Related Nausea And Vomiting  . Sulfa Antibiotics Other (See Comments)    Reaction: unknown  . Latex Rash    VITALS:  Blood pressure 131/88, pulse (!) 115, temperature 98.5 F (36.9 C), temperature source Oral, resp. rate 16, height 4\' 11"  (1.499 m), weight 42.6 kg (93 lb 14.4 oz), SpO2 97 %.  PHYSICAL EXAMINATION:   Physical Exam  GENERAL:  50 y.o.-year-old patient lying in the bed in mild distress. EYES: Pupils equal, round, reactive to light and accommodation. No scleral icterus. Extraocular muscles intact.  HEENT: Head atraumatic, normocephalic. Oropharynx and nasopharynx clear.  NECK:  Supple, no jugular venous distention. No thyroid enlargement, no tenderness.  LUNGS: Normal breath  sounds bilaterally, no wheezing, rales, rhonchi. No use of accessory muscles of respiration.  CARDIOVASCULAR: S1, S2 normal. No murmurs, rubs, or gallops.  ABDOMEN: Soft, nontender, nondistended. Bowel sounds present. No organomegaly or mass.  EXTREMITIES: No cyanosis, clubbing or edema b/l.   Contracted and muscular atrophy due to Spina Bifida.   NEUROLOGIC: Cranial nerves II through XII are intact. No focal Motor or sensory deficits b/l. Globally weak   PSYCHIATRIC: The patient is alert and oriented x 3.  SKIN: No obvious rash, lesion, or ulcer.    LABORATORY PANEL:   CBC  Recent Labs Lab 06/03/16 0612  WBC 10.5  HGB 14.6  HCT 44.5  PLT 298   ------------------------------------------------------------------------------------------------------------------  Chemistries   Recent Labs Lab 06/02/16 2203 06/03/16 0612  NA 140 144  K 3.5 3.5  CL 108 112*  CO2 15* 18*  GLUCOSE 69 80  BUN 23* 24*  CREATININE 1.14 1.33*  CALCIUM 9.0 8.5*  AST 32  --   ALT 18  --   ALKPHOS 76  --   BILITOT 1.4*  --    ------------------------------------------------------------------------------------------------------------------  Cardiac Enzymes No results for input(s): TROPONINI in the last 168 hours. ------------------------------------------------------------------------------------------------------------------  RADIOLOGY:  Ct Abdomen Pelvis Wo Contrast  Result Date: 06/02/2016 CLINICAL DATA:  Diarrhea since Monday. Nausea and vomiting and abdominal pain starting today. History of ileostomy and spina bifida. Allergic to IV contrast material and patient fell unable to tolerate oral contrast material. EXAM: CT ABDOMEN AND PELVIS WITHOUT CONTRAST TECHNIQUE: Multidetector CT imaging of the abdomen and pelvis was performed following the standard protocol without IV contrast. COMPARISON:  04/20/2016 FINDINGS: Lower chest: Atelectasis in the lung bases.  Small esophageal hiatal hernia.  Fluid-filled distal esophagus may be due to reflux or dysmotility Hepatobiliary: No mass visualized on this un-enhanced exam. Pancreas: No mass or inflammatory process identified on this un-enhanced exam. Spleen: Within normal limits in size. Adrenals/Urinary Tract: No adrenal gland nodules. Kidneys demonstrate multiple intrarenal stones, measuring up to 2 cm diameter in the left kidney. Bilateral hydronephrosis and hydroureter. No definite ureteral stones. Scarring in both kidneys. Right lower quadrant urostomy. Renal changes appear chronic and are similar to previous study. Stomach/Bowel: Stomach is diffusely distended with decompressed small bowel. Gastric outlet obstruction is not excluded. No gastric wall thickening is appreciated. There is massive dilatation of the rectosigmoid colon and less prominent dilatation of the remainder the colon. Large amount of impacted stool is in the rectosigmoid colon. The rectum measures up to 13.9 cm in diameter. Mildly increased colonic distention since prior study. No colonic wall thickening, pneumatosis, or other signs of perforation although at this diameter, there is an increased risk of perforation. Vascular/Lymphatic: No pathologically enlarged lymph nodes. No evidence of abdominal aortic aneurysm. Reproductive: No mass or other significant abnormality. Other: No free air or free fluid in the abdomen. Musculoskeletal: Changes consistent with history of spina bifida with diffuse history of present throughout the visualized lumbar and sacral spine. 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 Left hip dysplasia with bilateral hip effusions. Superior subluxation of the left hip with associated deformity of the acetabulum and femoral head. IMPRESSION: Markedly distended rectosigmoid colon and mild distention of the remainder of the colon with large amount of likely impacted stool throughout the rectosigmoid colon. Changes are  progressed since previous study. Stomach is distended and fluid filled possibly indicating gastric outlet obstruction. Small bowel are decompressed. Chronic calcifications and chronic hydronephrosis and hydroureter both kidneys with right lower quadrant ileal conduit present. Skeletal changes consistent with history of spina bifida. Electronically Signed   By: Lucienne Capers M.D.   On: 06/02/2016 22:57     ASSESSMENT AND PLAN:   50 year old male with history of spina bifida, status post urostomy, history of decubitus ulcer stage II who presented to the hospital due to abdominal pain nausea and vomiting.  1. Abdominal pain nausea vomiting-I suspect this is secondary to constipation/obstipation. CT scan did show evidence of gastric outlet obstruction but this is highly unlikely. -Patient given a fleets enema today with good response and had 2 BMs. -I will get an x-ray of the abdomen in the morning. Continue clear liquid diet for now.  2. SIRS - ruled out.  Thought to be due to UTI. Pt is s/p Urostomy and likely colonized and therefore UA abnormal.  - cont. IV fluids, IV Ceftriaxone.  Await culture results.  - afebrile, hemodynamically stable. Slightly tachycardic  3. AKI - due to dehydration.  - hydrate w/ IV fluids and follow BUN/Cr.     All the records are reviewed and case discussed with Care Management/Social Worker. Management plans discussed with the patient, family and they are in agreement.  CODE STATUS: Full  DVT Prophylaxis: Lovenox  TOTAL TIME TAKING CARE OF THIS PATIENT: 30 minutes.   POSSIBLE D/C IN 1-2 DAYS, DEPENDING ON CLINICAL CONDITION.   Henreitta Leber M.D on 06/03/2016 at 2:15 PM  Between 7am to 6pm - Pager - [phone]  After 6pm go to [email or web address] - [facility]  Office  [phone]  CC: Primary care physician; No PCP Per Patient

## 2016-06-03 NOTE — Progress Notes (Signed)
To endo via bed 

## 2016-06-03 NOTE — ED Notes (Signed)
[name], family of pt left number and asked to be called with any results. [phone]

## 2016-06-03 NOTE — Op Note (Signed)
[facility] Gastroenterology Patient Name: [name] Procedure Date: 06/03/2016 5:31 PM MRN: [id number] Account #: [id number] Date of Birth: 1966/03/24 Admit Type: Inpatient Age: 50 Room: [facility] ENDO ROOM 4 Gender: Male Note Status: Finalized Procedure:            Upper GI endoscopy Indications:          Nausea with vomiting Providers:            [name] MD, MD Referring MD:         No Local Md, MD (Referring MD) Medicines:            Propofol per Anesthesia Complications:        No immediate complications. Procedure:            Pre-Anesthesia Assessment:                       - Prior to the procedure, a History and Physical was                        performed, and patient medications and allergies were                        reviewed. The patient's tolerance of previous                        anesthesia was also reviewed. The risks and benefits of                        the procedure and the sedation options and risks were                        discussed with the patient. All questions were                        answered, and informed consent was obtained. Prior                        Anticoagulants: The patient has taken no previous                        anticoagulant or antiplatelet agents. ASA Grade                        Assessment: II - A patient with mild systemic disease.                        After reviewing the risks and benefits, the patient was                        deemed in satisfactory condition to undergo the                        procedure.                       After obtaining informed consent, the endoscope was                        passed under direct vision. Throughout the procedure,  the patient's blood pressure, pulse, and oxygen                        saturations were monitored continuously. The Endoscope                        was introduced through the mouth, and advanced to the      second part of duodenum. The upper GI endoscopy was                        accomplished without difficulty. The patient tolerated                        the procedure well. Findings:      Many linear esophageal ulcers with oozing blood and stigmata of recent       bleeding were found in the lower third of the esophagus.      Localized mild inflammation characterized by erythema was found in the       entire examined stomach.      The examined duodenum was normal. Impression:           - Bleeding esophageal ulcers.                       - Gastritis.                       - Normal examined duodenum.                       - No specimens collected. Recommendation:       - Use a proton pump inhibitor PO BID. Procedure Code(s):    --- Professional ---                       [phone], Esophagogastroduodenoscopy, flexible, transoral;                        diagnostic, including collection of specimen(s) by                        brushing or washing, when performed (separate procedure) Diagnosis Code(s):    --- Professional ---                       K29.70, Gastritis, unspecified, without bleeding                       K22.11, Ulcer of esophagus with bleeding                       R11.2, Nausea with vomiting, unspecified CPT copyright 2016 American Medical Association. All rights reserved. The codes documented in this report are preliminary and upon coder review may  be revised to meet current compliance requirements. [name] MD, MD 06/03/2016 5:50:40 PM This report has been signed electronically. Number of Addenda: 0 Note Initiated On: 06/03/2016 5:31 PM      [facility]

## 2016-06-03 NOTE — Anesthesia Postprocedure Evaluation (Signed)
Anesthesia Post Note  Patient: Wilner Laurich  Procedure(s) Performed: Procedure(s) (LRB): ESOPHAGOGASTRODUODENOSCOPY (EGD) WITH PROPOFOL (N/A)  Patient location during evaluation: PACU Anesthesia Type: General Level of consciousness: awake and alert and oriented Pain management: pain level controlled Vital Signs Assessment: post-procedure vital signs reviewed and stable Respiratory status: spontaneous breathing Cardiovascular status: blood pressure returned to baseline Anesthetic complications: no    Last Vitals:  Vitals:   06/03/16 1840 06/03/16 1849  BP: (!) 145/80 (!) 145/68  Pulse: 92 100  Resp: 20 19  Temp:  36.4 C    Last Pain:  Vitals:   06/03/16 1122  TempSrc: Oral  PainSc:                  Makayah Pauli

## 2016-06-03 NOTE — Consult Note (Signed)
Chase Lame, MD [address] [place], [place] [address] Phone: [phone] Fax : [phone]  Consultation  Referring Provider:     No ref. provider found Primary Care Physician:  No PCP Per Patient Primary Gastroenterologist:           Reason for Consultation:     Nausea vomiting with constipation  Date of Admission:  06/02/2016 Date of Consultation:  06/03/2016         HPI:   [name] is a 50 y.o. male Who was admitted with nausea and vomiting.  The patient has a history of spina bifida.  The patient also reports that he was in the hospital recently for constipation which resolved when he was given laxatives.  The patient now reports that he has been having intractable nausea and vomiting.  There is no report of any black stools or bloody stools.  The patient was treated with enemas and had some results.  When I went to see the patient today he was not having any nausea or vomiting but states he was afraid to eat because he thought it may cause him to have nausea vomiting.  The patient denies ever having a colonoscopy in the past.  He also was found to have a urinary tract infection which was thought to contribute to his nausea vomiting. The patient also reports that he has pressure ulcers from being bed bound.  Past Medical History:  Diagnosis Date  . Decubitus ulcer   . Renal disorder   . Spina bifida [facility])     Past Surgical History:  Procedure Laterality Date  . ABDOMINAL SURGERY    . BACK SURGERY    . COLON SURGERY    . ilieostomy    . VENTRICULOPERITONEAL SHUNT    . vp shunt removal      Prior to Admission medications   Medication Sig Start Date End Date Taking? Authorizing Provider  feeding supplement, ENSURE ENLIVE, (ENSURE ENLIVE) LIQD Take 237 mLs by mouth 2 (two) times daily between meals. 04/26/16  Yes Fritzi Mandes, MD    Family History  Problem Relation Age of Onset  . Diabetes Mellitus II Father      Social History  Substance Use  Topics  . Smoking status: Never Smoker  . Smokeless tobacco: Never Used  . Alcohol use No    Allergies as of 06/02/2016 - Review Complete 06/02/2016  Allergen Reaction Noted  . Ivp dye [iodinated diagnostic agents] Hives 04/14/2015  . Latex  04/14/2015  . Morphine and related Nausea And Vomiting 04/14/2015  . Sulfa antibiotics  04/14/2015    Review of Systems:    All systems reviewed and negative except where noted in HPI.   Physical Exam:  Vital signs in last 24 hours: Temp:  [96.8 F (36 C)-99.1 F (37.3 C)] 97.6 F (36.4 C) (08/31 1849) Pulse Rate:  [92-122] 100 (08/31 1849) Resp:  [14-20] 19 (08/31 1849) BP: (124-159)/(68-99) 145/68 (08/31 1849) SpO2:  [94 %-100 %] 98 % (08/31 1849) Weight:  [93 lb 1.6 oz (42.2 kg)-93 lb 14.4 oz (42.6 kg)] 93 lb 14.4 oz (42.6 kg) (08/31 0509) Last BM Date: 06/02/16 General:   Pleasant, cooperative in NAD Head:  Normocephalic and atraumatic. Eyes:   No icterus.   Conjunctiva pink. PERRLA. Ears:  Normal auditory acuity. Neck:  Supple; no masses or thyroidomegaly Lungs: Respirations even and unlabored. Lungs clear to auscultation bilaterally.   No wheezes, crackles, or rhonchi.  Heart:  Regular rate and  rhythm;  Without murmur, clicks, rubs or gallops Abdomen:  Soft, nondistended, nontender. Normal bowel sounds. No appreciable masses or hepatomegaly.  No rebound or guarding.  Rectal:  Not performed. Skin:  Intact without significant lesions or rashes. Cervical Nodes:  No significant cervical adenopathy. Psych:  Alert and cooperative. Normal affect.  LAB RESULTS:  Recent Labs  06/02/16 2203 06/03/16 0612  WBC 16.8* 10.5  HGB 15.9 14.6  HCT 47.8 44.5  PLT 361 298   BMET  Recent Labs  06/02/16 2203 06/03/16 0612  NA 140 144  K 3.5 3.5  CL 108 112*  CO2 15* 18*  GLUCOSE 69 80  BUN 23* 24*  CREATININE 1.14 1.33*  CALCIUM 9.0 8.5*   LFT  Recent Labs  06/02/16 2203  PROT 8.2*  ALBUMIN 4.2  AST 32  ALT 18  ALKPHOS  76  BILITOT 1.4*   PT/INR No results for input(s): LABPROT, INR in the last 72 hours.  STUDIES: Ct Abdomen Pelvis Wo Contrast  Result Date: 06/02/2016 CLINICAL DATA:  Diarrhea since Monday. Nausea and vomiting and abdominal pain starting today. History of ileostomy and spina bifida. Allergic to IV contrast material and patient fell unable to tolerate oral contrast material. EXAM: CT ABDOMEN AND PELVIS WITHOUT CONTRAST TECHNIQUE: Multidetector CT imaging of the abdomen and pelvis was performed following the standard protocol without IV contrast. COMPARISON:  04/20/2016 FINDINGS: Lower chest: Atelectasis in the lung bases. Small esophageal hiatal hernia. Fluid-filled distal esophagus may be due to reflux or dysmotility Hepatobiliary: No mass visualized on this un-enhanced exam. Pancreas: No mass or inflammatory process identified on this un-enhanced exam. Spleen: Within normal limits in size. Adrenals/Urinary Tract: No adrenal gland nodules. Kidneys demonstrate multiple intrarenal stones, measuring up to 2 cm diameter in the left kidney. Bilateral hydronephrosis and hydroureter. No definite ureteral stones. Scarring in both kidneys. Right lower quadrant urostomy. Renal changes appear chronic and are similar to previous study. Stomach/Bowel: Stomach is diffusely distended with decompressed small bowel. Gastric outlet obstruction is not excluded. No gastric wall thickening is appreciated. There is massive dilatation of the rectosigmoid colon and less prominent dilatation of the remainder the colon. Large amount of impacted stool is in the rectosigmoid colon. The rectum measures up to 13.9 cm in diameter. Mildly increased colonic distention since prior study. No colonic wall thickening, pneumatosis, or other signs of perforation although at this diameter, there is an increased risk of perforation. Vascular/Lymphatic: No pathologically enlarged lymph nodes. No evidence of abdominal aortic aneurysm. Reproductive:  No mass or other significant abnormality. Other: No free air or free fluid in the abdomen. Musculoskeletal: Changes consistent with history of spina bifida with diffuse history of present throughout the visualized lumbar and sacral spine. 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 Left hip dysplasia with bilateral hip effusions. Superior subluxation of the left hip with associated deformity of the acetabulum and femoral head. IMPRESSION: Markedly distended rectosigmoid colon and mild distention of the remainder of the colon with large amount of likely impacted stool throughout the rectosigmoid colon. Changes are progressed since previous study. Stomach is distended and fluid filled possibly indicating gastric outlet obstruction. Small bowel are decompressed. Chronic calcifications and chronic hydronephrosis and hydroureter both kidneys with right lower quadrant ileal conduit present. Skeletal changes consistent with history of spina bifida. Electronically Signed   By: Lucienne Capers M.D.   On: 06/02/2016 22:57      Impression / Plan:   [name] is a 50 y.o.  y/o male with Who comes in with nausea vomiting with constipation.  The patient's nausea and vomiting is somewhat better now that he is had some bowel movements.  The patient was brought down for an EGD and was shown to have some mild gastritis with severe ulcerative esophagitis.  There was some fresh blood around the esophageal ulcers.  The patient should be treated with a PPI twice a day.  There is no sign of gastric outlet obstruction as reported on the CT scan.  The scope easily passed into the small bowel.  I would continue treating the patient with laxatives and enemas to help with his constipation.   Thank you for involving me in the care of this patient.      LOS: 0 days   Chase Lame, MD  06/03/2016, 7:09 PM   Note: This dictation was prepared with Dragon dictation along with smaller  phrase technology. Any transcriptional errors that result from this process are unintentional.

## 2016-06-03 NOTE — Progress Notes (Signed)
Back from Endo s/p EGD.  No distress on ra.  IVF infusing well.  Denies need, cb in reach, Sr up x 2, bed alarm on.

## 2016-06-03 NOTE — Plan of Care (Signed)
Problem: Safety: Goal: Ability to remain free from injury will improve Outcome: Progressing Fall precautions in place  Problem: Physical Regulation: Goal: Ability to maintain clinical measurements within normal limits will improve Outcome: Progressing wc bound  Problem: Tissue Perfusion: Goal: Risk factors for ineffective tissue perfusion will decrease Outcome: Progressing SQ Lovenox

## 2016-06-03 NOTE — Anesthesia Preprocedure Evaluation (Addendum)
Anesthesia Evaluation  Patient identified by MRN, date of birth, ID band  Airway Mallampati: II  TM Distance: >3 FB     Dental   Pulmonary neg pulmonary ROS,    Pulmonary exam normal        Cardiovascular negative cardio ROS Normal cardiovascular exam     Neuro/Psych Spina bifida  Hx of VP shunt  Neuromuscular disease negative psych ROS   GI/Hepatic Neg liver ROS, Possible gastric outlet obstruction Hx of ileostomy   Endo/Other  negative endocrine ROS  Renal/GU Renal disease     Musculoskeletal Decubitus ulcer   Abdominal Normal abdominal exam  (+)   Peds negative pediatric ROS (+)  Hematology negative hematology ROS (+)   Anesthesia Other Findings   Reproductive/Obstetrics                            Anesthesia Physical Anesthesia Plan  ASA: III and emergent  Anesthesia Plan: General   Post-op Pain Management:    Induction: Intravenous, Rapid sequence and Cricoid pressure planned  Airway Management Planned: Oral ETT  Additional Equipment:   Intra-op Plan:   Post-operative Plan: Extubation in OR  Informed Consent: I have reviewed the patients History and Physical, chart, labs and discussed the procedure including the risks, benefits and alternatives for the proposed anesthesia with the patient or authorized representative who has indicated his/her understanding and acceptance.   Dental advisory given  Plan Discussed with: CRNA and Surgeon  Anesthesia Plan Comments:         Anesthesia Quick Evaluation

## 2016-06-03 NOTE — Consult Note (Signed)
Surgical Consultation  06/03/2016  Rayshun Kandler is an 50 y.o. male.   CC: Abdominal pain  HPI: This a patient with 2 days of abdominal pain with nausea and vomiting he is not passing any gas and has not had a bowel movement that he can remember. Of note the patient is very angry and uncomfortable and is actively vomiting at the time of my visit. With that in mind he has complained that I'm asking the same questions that others have asked and he refuses to answer any further questions. I am unable to obtain a review of systems for that reason. Much of the history is been obtained from Dr. Owens Shark in the emergency room  Past Medical History:  Diagnosis Date  . Decubitus ulcer   . Renal disorder   . Spina bifida [facility])     Past Surgical History:  Procedure Laterality Date  . ABDOMINAL SURGERY    . BACK SURGERY    . COLON SURGERY    . ilieostomy    . VENTRICULOPERITONEAL SHUNT    . vp shunt removal      Family History  Problem Relation Age of Onset  . Diabetes Mellitus II Father     Social History:  reports that he has never smoked. He has never used smokeless tobacco. He reports that he does not drink alcohol or use drugs.  Allergies:  Allergies  Allergen Reactions  . Ivp Dye [Iodinated Diagnostic Agents] Hives  . Latex   . Morphine And Related Nausea And Vomiting  . Sulfa Antibiotics     Medications reviewed.   Review of Systems:   Review of Systems  Unable to perform ROS: Patient unresponsive     Physical Exam:  BP 132/82 (BP Location: Right Arm)   Pulse (!) 105   Temp (!) 96.8 F (36 C) (Axillary)   Resp 16   Ht _0  (1.499 m)   Wt 93 lb 1.6 oz (42.2 kg)   SpO2 95%   BMI 18.80 kg/m   Physical Exam  Constitutional: He appears distressed.  Chronically ill-appearing patient dry mucous membranes actively vomiting  HENT:  Head: Normocephalic and atraumatic.  Mouth/Throat: No oropharyngeal exudate.  Dry mucous membranes Dried emesis around mouth   Eyes: Right eye exhibits no discharge. Left eye exhibits no discharge. No scleral icterus.  Neck: Normal range of motion.  Cardiovascular: Normal rate, regular rhythm and normal heart sounds.   Pulmonary/Chest: Effort normal. No respiratory distress. He has no wheezes. He has no rales.  Abdominal: Soft. He exhibits distension. There is no tenderness. There is no rebound and no guarding.  Soft and minimally distended nontender no peritoneal signs right lower quadrant urostomy pink and viable  Genitourinary:  Genitourinary Comments: Cloudy urine in the urostomy  Musculoskeletal: He exhibits no edema or tenderness.  Lymphadenopathy:    He has no cervical adenopathy.  Neurological: He is alert.  Skin: Skin is warm and dry. No rash noted. He is not diaphoretic. No erythema.  Vitals reviewed.     Results for orders placed or performed during the hospital encounter of 06/02/16 (from the past 48 hour(s))  Lipase, blood     Status: None   Collection Time: 06/02/16 10:03 PM  Result Value Ref Range   Lipase 22 11 - 51 U/L  Comprehensive metabolic panel     Status: Abnormal   Collection Time: 06/02/16 10:03 PM  Result Value Ref Range   Sodium 140 135 - 145 mmol/L   Potassium 3.5  3.5 - 5.1 mmol/L    Comment: HEMOLYSIS AT THIS LEVEL MAY AFFECT RESULT   Chloride 108 101 - 111 mmol/L   CO2 15 (L) 22 - 32 mmol/L   Glucose, Bld 69 65 - 99 mg/dL   BUN 23 (H) 6 - 20 mg/dL   Creatinine, Ser 1.14 0.61 - 1.24 mg/dL   Calcium 9.0 8.9 - 10.3 mg/dL   Total Protein 8.2 (H) 6.5 - 8.1 g/dL   Albumin 4.2 3.5 - 5.0 g/dL   AST 32 15 - 41 U/L    Comment: HEMOLYSIS AT THIS LEVEL MAY AFFECT RESULT   ALT 18 17 - 63 U/L    Comment: HEMOLYSIS AT THIS LEVEL MAY AFFECT RESULT   Alkaline Phosphatase 76 38 - 126 U/L   Total Bilirubin 1.4 (H) 0.3 - 1.2 mg/dL    Comment: HEMOLYSIS AT THIS LEVEL MAY AFFECT RESULT   GFR calc non Af Amer >60 >60 mL/min   GFR calc Af Amer >60 >60 mL/min    Comment: (NOTE) The eGFR  has been calculated using the CKD EPI equation. This calculation has not been validated in all clinical situations. eGFR's persistently <60 mL/min signify possible Chronic Kidney Disease.    Anion gap 17 (H) 5 - 15  CBC with Differential     Status: Abnormal   Collection Time: 06/02/16 10:03 PM  Result Value Ref Range   WBC 16.8 (H) 3.8 - 10.6 K/uL   RBC 5.58 4.40 - 5.90 MIL/uL   Hemoglobin 15.9 13.0 - 18.0 g/dL   HCT 47.8 40.0 - 52.0 %   MCV 85.6 80.0 - 100.0 fL   MCH 28.5 26.0 - 34.0 pg   MCHC 33.3 32.0 - 36.0 g/dL   RDW 14.6 (H) 11.5 - 14.5 %   Platelets 361 150 - 440 K/uL   Neutrophils Relative % 91 %   Neutro Abs 15.2 (H) 1.4 - 6.5 K/uL   Lymphocytes Relative 4 %   Lymphs Abs 0.6 (L) 1.0 - 3.6 K/uL   Monocytes Relative 5 %   Monocytes Absolute 0.8 0.2 - 1.0 K/uL   Eosinophils Relative 0 %   Eosinophils Absolute 0.0 0 - 0.7 K/uL   Basophils Relative 0 %   Basophils Absolute 0.1 0 - 0.1 K/uL  Urinalysis complete, with microscopic     Status: Abnormal   Collection Time: 06/03/16 12:21 AM  Result Value Ref Range   Color, Urine YELLOW (A) YELLOW   APPearance CLEAR (A) CLEAR   Glucose, UA NEGATIVE NEGATIVE mg/dL   Bilirubin Urine NEGATIVE NEGATIVE   Ketones, ur NEGATIVE NEGATIVE mg/dL   Specific Gravity, Urine 1.015 1.005 - 1.030   Hgb urine dipstick NEGATIVE NEGATIVE   pH 7.0 5.0 - 8.0   Protein, ur NEGATIVE NEGATIVE mg/dL   Nitrite NEGATIVE NEGATIVE   Leukocytes, UA TRACE (A) NEGATIVE   RBC / HPF 6-30 0 - 5 RBC/hpf   WBC, UA TOO NUMEROUS TO COUNT 0 - 5 WBC/hpf   Bacteria, UA NONE SEEN NONE SEEN   Squamous Epithelial / LPF NONE SEEN NONE SEEN   WBC Clumps PRESENT    Mucous PRESENT    Ct Abdomen Pelvis Wo Contrast  Result Date: 06/02/2016 CLINICAL DATA:  Diarrhea since Monday. Nausea and vomiting and abdominal pain starting today. History of ileostomy and spina bifida. Allergic to IV contrast material and patient fell unable to tolerate oral contrast material. EXAM:  CT ABDOMEN AND PELVIS WITHOUT CONTRAST TECHNIQUE: Multidetector CT imaging of the abdomen  and pelvis was performed following the standard protocol without IV contrast. COMPARISON:  04/20/2016 FINDINGS: Lower chest: Atelectasis in the lung bases. Small esophageal hiatal hernia. Fluid-filled distal esophagus may be due to reflux or dysmotility Hepatobiliary: No mass visualized on this un-enhanced exam. Pancreas: No mass or inflammatory process identified on this un-enhanced exam. Spleen: Within normal limits in size. Adrenals/Urinary Tract: No adrenal gland nodules. Kidneys demonstrate multiple intrarenal stones, measuring up to 2 cm diameter in the left kidney. Bilateral hydronephrosis and hydroureter. No definite ureteral stones. Scarring in both kidneys. Right lower quadrant urostomy. Renal changes appear chronic and are similar to previous study. Stomach/Bowel: Stomach is diffusely distended with decompressed small bowel. Gastric outlet obstruction is not excluded. No gastric wall thickening is appreciated. There is massive dilatation of the rectosigmoid colon and less prominent dilatation of the remainder the colon. Large amount of impacted stool is in the rectosigmoid colon. The rectum measures up to 13.9 cm in diameter. Mildly increased colonic distention since prior study. No colonic wall thickening, pneumatosis, or other signs of perforation although at this diameter, there is an increased risk of perforation. Vascular/Lymphatic: No pathologically enlarged lymph nodes. No evidence of abdominal aortic aneurysm. Reproductive: No mass or other significant abnormality. Other: No free air or free fluid in the abdomen. Musculoskeletal: Changes consistent with history of spina bifida with diffuse history of present throughout the visualized lumbar and sacral spine. 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 Left hip dysplasia with bilateral hip effusions. Superior  subluxation of the left hip with associated deformity of the acetabulum and femoral head. IMPRESSION: Markedly distended rectosigmoid colon and mild distention of the remainder of the colon with large amount of likely impacted stool throughout the rectosigmoid colon. Changes are progressed since previous study. Stomach is distended and fluid filled possibly indicating gastric outlet obstruction. Small bowel are decompressed. Chronic calcifications and chronic hydronephrosis and hydroureter both kidneys with right lower quadrant ileal conduit present. Skeletal changes consistent with history of spina bifida. Electronically Signed   By: Lucienne Capers M.D.   On: 06/02/2016 22:57    Assessment/Plan:  CT scan is personally reviewed. I discussed with Dr. Owens Shark on 2 separate occasions before and after seeing the patient my concerns about his acidosis as well as his diagnosis of a UTI with negative bacteria but TNTC white blood cell count. More importantly the CT scan in my read is more suggestive of a rectal plug of stool and hit the patient needs to be manually disimpacted as his rectum is very dilated. I cannot assess gastric outlet obstruction in this patient who has not had oral contrast given but he is actively vomiting and certainly warrants a nasogastric tube at this time The patient's anion gap acidosis needs to be addressed as I do not see signs of acute abdominal process that would explain his acidosis or his white blood cell count. Anal disimpaction will be performed per Dr. Owens Shark. A nasogastric tube is been ordered and he will be admitted to medicine and we will reexamine the patient later today. Lactic acid level was added at my request and is currently pending proximally 60 minutes were spent with this patient in direct patient care and reviewing CT and labs.  Florene Glen, MD, FACS

## 2016-06-04 ENCOUNTER — Inpatient Hospital Stay: Payer: Medicare Other

## 2016-06-04 LAB — URINE CULTURE

## 2016-06-04 LAB — BASIC METABOLIC PANEL
ANION GAP: 11 (ref 5–15)
BUN: 20 mg/dL (ref 6–20)
CALCIUM: 8.3 mg/dL — AB (ref 8.9–10.3)
CO2: 15 mmol/L — AB (ref 22–32)
CREATININE: 1.01 mg/dL (ref 0.61–1.24)
Chloride: 124 mmol/L — ABNORMAL HIGH (ref 101–111)
GLUCOSE: 68 mg/dL (ref 65–99)
Potassium: 4 mmol/L (ref 3.5–5.1)
Sodium: 150 mmol/L — ABNORMAL HIGH (ref 135–145)

## 2016-06-04 MED ORDER — ADULT MULTIVITAMIN W/MINERALS CH
1.0000 | ORAL_TABLET | Freq: Every day | ORAL | Status: DC
  Administered 2016-06-04 – 2016-06-08 (×4): 1 via ORAL
  Filled 2016-06-04 (×5): qty 1

## 2016-06-04 MED ORDER — BOOST / RESOURCE BREEZE PO LIQD
1.0000 | Freq: Three times a day (TID) | ORAL | Status: DC
  Administered 2016-06-04 – 2016-06-08 (×11): 1 via ORAL

## 2016-06-04 MED ORDER — DEXTROSE 5 % IV SOLN
INTRAVENOUS | Status: DC
  Administered 2016-06-04: 09:00:00 via INTRAVENOUS

## 2016-06-04 MED ORDER — METOCLOPRAMIDE HCL 5 MG/5ML PO SOLN
10.0000 mg | Freq: Four times a day (QID) | ORAL | Status: DC | PRN
  Filled 2016-06-04 (×2): qty 10

## 2016-06-04 MED ORDER — BISACODYL 10 MG RE SUPP
10.0000 mg | Freq: Every day | RECTAL | Status: DC | PRN
  Filled 2016-06-04 (×2): qty 1

## 2016-06-04 MED ORDER — LACTULOSE 10 GM/15ML PO SOLN
30.0000 g | Freq: Two times a day (BID) | ORAL | Status: DC
  Administered 2016-06-04 – 2016-06-08 (×6): 30 g via ORAL
  Filled 2016-06-04 (×8): qty 60

## 2016-06-04 NOTE — Progress Notes (Signed)
[facility] [place] NAME: [name]    MR#:  [id number]  DATE OF BIRTH:  12-27-1965  SUBJECTIVE:   Patient is here due to nausea vomiting and thought to have a urinary tract infection. Still having some nausea vomiting and dyspepsia. Status post endoscopy yesterday showing no evidence of gastric outlet obstruction. Had 2 BM's with Enema yesterday.   REVIEW OF SYSTEMS:    Review of Systems  Constitutional: Negative for chills and fever.  HENT: Negative for congestion and tinnitus.   Eyes: Negative for blurred vision and double vision.  Respiratory: Negative for cough, shortness of breath and wheezing.   Cardiovascular: Negative for chest pain, orthopnea and PND.  Gastrointestinal: Positive for abdominal pain, nausea and vomiting. Negative for diarrhea.  Genitourinary: Negative for dysuria and hematuria.  Neurological: Negative for dizziness, sensory change and focal weakness.  All other systems reviewed and are negative.   Nutrition: Clear liquids Tolerating Diet: Little Tolerating PT: pt. bedbound due to Spina-bifida  DRUG ALLERGIES:   Allergies  Allergen Reactions  . Ivp Dye [Iodinated Diagnostic Agents] Hives  . Morphine And Related Nausea And Vomiting  . Sulfa Antibiotics Other (See Comments)    Reaction: unknown  . Latex Rash    VITALS:  Blood pressure (!) 153/72, pulse 92, temperature 97.6 F (36.4 C), resp. rate 18, height 4\' 11"  (1.499 m), weight 42.6 kg (93 lb 14.4 oz), SpO2 99 %.  PHYSICAL EXAMINATION:   Physical Exam  GENERAL:  50 y.o.-year-old patient lying in bed in mild distress. EYES: Pupils equal, round, reactive to light and accommodation. No scleral icterus. Extraocular muscles intact.  HEENT: Head atraumatic, normocephalic. Oropharynx and nasopharynx clear.  NECK:  Supple, no jugular venous distention. No thyroid enlargement, no tenderness.  LUNGS: Normal breath sounds bilaterally, no wheezing,  rales, rhonchi. No use of accessory muscles of respiration.  CARDIOVASCULAR: S1, S2 normal. No murmurs, rubs, or gallops.  ABDOMEN: Soft, nontender, nondistended. Hypoactive Bowel sounds. No organomegaly or mass.  EXTREMITIES: No cyanosis, clubbing or edema b/l.   Contracted and muscular atrophy due to Spina Bifida.   NEUROLOGIC: Cranial nerves II through XII are intact. No focal Motor or sensory deficits b/l. Globally weak   PSYCHIATRIC: The patient is alert and oriented x 3.  SKIN: No obvious rash, lesion, or ulcer.    LABORATORY PANEL:   CBC  Recent Labs Lab 06/03/16 0612  WBC 10.5  HGB 14.6  HCT 44.5  PLT 298   ------------------------------------------------------------------------------------------------------------------  Chemistries   Recent Labs Lab 06/02/16 2203  06/04/16 0447  NA 140  < > 150*  K 3.5  < > 4.0  CL 108  < > 124*  CO2 15*  < > 15*  GLUCOSE 69  < > 68  BUN 23*  < > 20  CREATININE 1.14  < > 1.01  CALCIUM 9.0  < > 8.3*  AST 32  --   --   ALT 18  --   --   ALKPHOS 76  --   --   BILITOT 1.4*  --   --   < > = values in this interval not displayed. ------------------------------------------------------------------------------------------------------------------  Cardiac Enzymes No results for input(s): TROPONINI in the last 168 hours. ------------------------------------------------------------------------------------------------------------------  RADIOLOGY:  Ct Abdomen Pelvis Wo Contrast  Result Date: 06/02/2016 CLINICAL DATA:  Diarrhea since Monday. Nausea and vomiting and abdominal pain starting today. History of ileostomy and spina bifida. Allergic to IV contrast material and patient  fell unable to tolerate oral contrast material. EXAM: CT ABDOMEN AND PELVIS WITHOUT CONTRAST TECHNIQUE: Multidetector CT imaging of the abdomen and pelvis was performed following the standard protocol without IV contrast. COMPARISON:  04/20/2016 FINDINGS: Lower  chest: Atelectasis in the lung bases. Small esophageal hiatal hernia. Fluid-filled distal esophagus may be due to reflux or dysmotility Hepatobiliary: No mass visualized on this un-enhanced exam. Pancreas: No mass or inflammatory process identified on this un-enhanced exam. Spleen: Within normal limits in size. Adrenals/Urinary Tract: No adrenal gland nodules. Kidneys demonstrate multiple intrarenal stones, measuring up to 2 cm diameter in the left kidney. Bilateral hydronephrosis and hydroureter. No definite ureteral stones. Scarring in both kidneys. Right lower quadrant urostomy. Renal changes appear chronic and are similar to previous study. Stomach/Bowel: Stomach is diffusely distended with decompressed small bowel. Gastric outlet obstruction is not excluded. No gastric wall thickening is appreciated. There is massive dilatation of the rectosigmoid colon and less prominent dilatation of the remainder the colon. Large amount of impacted stool is in the rectosigmoid colon. The rectum measures up to 13.9 cm in diameter. Mildly increased colonic distention since prior study. No colonic wall thickening, pneumatosis, or other signs of perforation although at this diameter, there is an increased risk of perforation. Vascular/Lymphatic: No pathologically enlarged lymph nodes. No evidence of abdominal aortic aneurysm. Reproductive: No mass or other significant abnormality. Other: No free air or free fluid in the abdomen. Musculoskeletal: Changes consistent with history of spina bifida with diffuse history of present throughout the visualized lumbar and sacral spine. 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 Left hip dysplasia with bilateral hip effusions. Superior subluxation of the left hip with associated deformity of the acetabulum and femoral head. IMPRESSION: Markedly distended rectosigmoid colon and mild distention of the remainder of the colon with large amount of likely  impacted stool throughout the rectosigmoid colon. Changes are progressed since previous study. Stomach is distended and fluid filled possibly indicating gastric outlet obstruction. Small bowel are decompressed. Chronic calcifications and chronic hydronephrosis and hydroureter both kidneys with right lower quadrant ileal conduit present. Skeletal changes consistent with history of spina bifida. Electronically Signed   By: Lucienne Capers M.D.   On: 06/02/2016 22:57   Dg Abd 1 View  Result Date: 06/04/2016 CLINICAL DATA:  Ileus EXAM: ABDOMEN - 1 VIEW COMPARISON:  CT abdomen and pelvis June 02, 2016 FINDINGS: The sigmoid and rectum are distended and filled with stool. There is less pronounced dilatation of bowel elsewhere. No free air evident. There is evidence of hip dysplasia on the left with subluxation of the left femoral head with respect the acetabulum. Bones are osteoporotic. Spinal dysraphism is better seen on CT but evident on the current examination. IMPRESSION: Persistent distention of the rectum and sigmoid with stool and air, not appreciably changed from recent CT examination. No free air. No progression of bowel dilatation. Evidence of spinal dysraphism with left acetabular dysplasia. Bones osteoporotic. Electronically Signed   By: Lowella Grip III M.D.   On: 06/04/2016 08:17     ASSESSMENT AND PLAN:   50 year old male with history of spina bifida, status post urostomy, history of decubitus ulcer stage II who presented to the hospital due to abdominal pain nausea and vomiting.  1. Abdominal pain nausea vomiting-I suspect this is secondary to constipation/obstipation. CT scan did show evidence of gastric outlet obstruction but this Has been ruled out now. -Status post endoscopy yesterday and no evidence of gastric outlet obstruction. Patient was noted  to have esophageal ulcers with severe esophagitis. As per GI continue PPI twice a day. -Continue supportive care with laxatives and  follow.  - cont. Clear liquid diet.  Will start some Reglan for N/V as zofran not helping.  2. SIRS - ruled out.  Thought to be due to UTI. Pt is s/p Urostomy and likely colonized and therefore UA abnormal.  - Urine cultures suggestive of contamination and will d/c IV abx for now.  - afebrile, hemodynamically stable. Slightly tachycardic  3. AKI - due to dehydration.  - improved w/ IV fluid hydration and will monitor.   4. Hypernatremia - will switch IV fluids from NS to D5W and repeat in a.m.    All the records are reviewed and case discussed with Care Management/Social Worker. Management plans discussed with the patient, family and they are in agreement.  CODE STATUS: Full  DVT Prophylaxis: Lovenox  TOTAL TIME TAKING CARE OF THIS PATIENT: 25 minutes.   POSSIBLE D/C IN 1-2 DAYS, DEPENDING ON CLINICAL CONDITION.   Henreitta Leber M.D on 06/04/2016 at 1:49 PM  Between 7am to 6pm - Pager - [phone]  After 6pm go to [email or web address] - Technical brewer [facility]  Office  [phone]  CC: Primary care physician; No PCP Per Patient

## 2016-06-04 NOTE — Progress Notes (Signed)
Initial Nutrition Assessment  DOCUMENTATION CODES:   Not applicable  INTERVENTION:  -Recommend addition of MVI -Diet progression as tolerated -Encourage protein intake -Add Boost Breeze po TID, each supplement provides 250 kcal and 9 grams of protein. Pt may benefit from additional Protein supplement  NUTRITION DIAGNOSIS:   Inadequate oral intake related to acute illness, altered GI function as evidenced by  (not tolerating po, N/V).  GOAL:   Patient will meet greater than or equal to 90% of their needs  MONITOR:   Diet advancement, Supplement acceptance, PO intake, Labs, Weight trends  REASON FOR ASSESSMENT:   Low Braden    ASSESSMENT:    50 yo male admitted with admitted with abdominal pain, N/V, AKI with hypernatremia. Pt with paraplegia due to spina bifida.   EGD on 8/31 with bleeding esophageal ulcers, gastritis; no sign of GOO.   Pt reports poor appetite with N/V for a few days prior to admission. Pt reports in a typical day he does not eat anything for breakfast, he eats a sandwich for lunch, he snacks on chips and candy and for dinner he might have a meal from "Meals on Wheels" or a [employer] "microwaveable cup. Pt reports he typically drinks Boost BID. He also tries to take a MVI but sometimes he forgets.   Pt having some BMs, N/V persists today, having dyspepsia as well. Pt did not eat breakfast this AM, reports he has only had some ginger ale, requesting some coke.   Past Medical History:  Diagnosis Date  . Decubitus ulcer   . Renal disorder   . Spina bifida [facility])      Diet Order:  Diet clear liquid Room service appropriate? Yes; Fluid consistency: Thin  Skin:   (stage III pressure ulcer on heel)  Last BM:  9/1   Labs: sodium 150  Meds: D5 at 50 ml/hr  Height:   Ht Readings from Last 1 Encounters:  06/02/16 4\' 11"  (1.499 m)    Weight:   Wt Readings from Last 1 Encounters:  06/03/16 93 lb 14.4 oz (42.6 kg)    Ideal Body Weight:      BMI:  Body mass index is 18.97 kg/m.  Estimated Nutritional Needs:   Kcal:  1300-1600 kcals   Protein:  64-86 g  Fluid:  >/= 1.5 L  EDUCATION NEEDS:   No education needs identified at this time  [place], [place], [place] [phone] Pager  [phone] Weekend/On-Call Pager

## 2016-06-04 NOTE — Care Management Important Message (Signed)
Important Message  Patient Details  Name: [name] MRN: [id number] Date of Birth: 1966/08/24   Medicare Important Message Given:  Yes    Jolly Mango, RN 06/04/2016, 9:46 AM

## 2016-06-04 NOTE — Plan of Care (Signed)
Problem: Nutrition: Goal: Adequate nutrition will be maintained Outcome: Not Progressing Patient on clear liquids. Not eating well due to nausea and vomitting.

## 2016-06-05 ENCOUNTER — Inpatient Hospital Stay: Payer: Medicare Other

## 2016-06-05 LAB — BASIC METABOLIC PANEL
ANION GAP: 4 — AB (ref 5–15)
BUN: 9 mg/dL (ref 6–20)
CALCIUM: 8.3 mg/dL — AB (ref 8.9–10.3)
CO2: 22 mmol/L (ref 22–32)
CREATININE: 0.71 mg/dL (ref 0.61–1.24)
Chloride: 115 mmol/L — ABNORMAL HIGH (ref 101–111)
Glucose, Bld: 111 mg/dL — ABNORMAL HIGH (ref 65–99)
Potassium: 2.6 mmol/L — CL (ref 3.5–5.1)
Sodium: 141 mmol/L (ref 135–145)

## 2016-06-05 MED ORDER — FLEET ENEMA 7-19 GM/118ML RE ENEM
1.0000 | ENEMA | Freq: Once | RECTAL | Status: AC
  Administered 2016-06-06: 1 via RECTAL

## 2016-06-05 MED ORDER — POTASSIUM CL IN DEXTROSE 5% 20 MEQ/L IV SOLN
20.0000 meq | INTRAVENOUS | Status: DC
Start: 2016-06-05 — End: 2016-06-08
  Administered 2016-06-05 – 2016-06-07 (×5): 20 meq via INTRAVENOUS
  Filled 2016-06-05 (×7): qty 1000

## 2016-06-05 MED ORDER — FLEET ENEMA 7-19 GM/118ML RE ENEM: 1.0000 | ENEMA | Freq: Once | RECTAL | Status: DC | PRN

## 2016-06-05 MED ORDER — POTASSIUM CHLORIDE 10 MEQ/100ML IV SOLN
10.0000 meq | INTRAVENOUS | Status: AC
  Administered 2016-06-05 (×3): 10 meq via INTRAVENOUS
  Filled 2016-06-05 (×3): qty 100

## 2016-06-05 NOTE — Progress Notes (Signed)
Lab called to say potassium level at critical low of 2.6.  Dr. Estanislado Pandy notified. Instructed to call back when the rest of the labs show up in EPIC.

## 2016-06-05 NOTE — Progress Notes (Signed)
A&O. Bedbound. Incontinent. Urostomy in place. Bag changed at beginning of shift. Uneventful night. IV fluids infusing. IV pepcid given. Meds given for nausea. Refused PO meds due to nausea. Refused to be repositioned during the night.

## 2016-06-05 NOTE — Progress Notes (Signed)
[facility] at [place] NAME: [name]    MR#:  [id number]  DATE OF BIRTH:  26-Sep-1966  SUBJECTIVE:   Patient is here due to nausea vomiting and thought to have a urinary tract infection. Still having Persistent nausea and vomiting. KUB showing Fecal impaction, constipation.   REVIEW OF SYSTEMS:    Review of Systems  Constitutional: Negative for chills and fever.  HENT: Negative for congestion and tinnitus.   Eyes: Negative for blurred vision and double vision.  Respiratory: Negative for cough, shortness of breath and wheezing.   Cardiovascular: Negative for chest pain, orthopnea and PND.  Gastrointestinal: Positive for abdominal pain, nausea and vomiting. Negative for diarrhea.  Genitourinary: Negative for dysuria and hematuria.  Neurological: Negative for dizziness, sensory change and focal weakness.  All other systems reviewed and are negative.   Nutrition: Clear liquids Tolerating Diet: NO Tolerating PT: pt. bedbound due to Spina-bifida  DRUG ALLERGIES:   Allergies  Allergen Reactions  . Ivp Dye [Iodinated Diagnostic Agents] Hives  . Morphine And Related Nausea And Vomiting  . Sulfa Antibiotics Other (See Comments)    Reaction: unknown  . Latex Rash    VITALS:  Blood pressure (!) 138/94, pulse (!) 104, temperature 97.7 F (36.5 C), temperature source Oral, resp. rate 18, height 4\' 11"  (1.499 m), weight 42.6 kg (93 lb 14.4 oz), SpO2 96 %.  PHYSICAL EXAMINATION:   Physical Exam  GENERAL:  50 y.o.-year-old patient lying in bed in mild distress. EYES: Pupils equal, round, reactive to light and accommodation. No scleral icterus. Extraocular muscles intact.  HEENT: Head atraumatic, normocephalic. Oropharynx and nasopharynx clear.  NECK:  Supple, no jugular venous distention. No thyroid enlargement, no tenderness.  LUNGS: Normal breath sounds bilaterally, no wheezing, rales, rhonchi. No use of accessory muscles of  respiration.  CARDIOVASCULAR: S1, S2 normal. No murmurs, rubs, or gallops.  ABDOMEN: Soft, nontender, Distended. Hypoactive Bowel sounds. No organomegaly or mass.  EXTREMITIES: No cyanosis, clubbing or edema b/l.   Contracted and muscular atrophy due to Spina Bifida.   NEUROLOGIC: Cranial nerves II through XII are intact. No focal Motor or sensory deficits b/l. Globally weak   PSYCHIATRIC: The patient is alert and oriented x 3.  SKIN: No obvious rash, lesion, or ulcer.    LABORATORY PANEL:   CBC  Recent Labs Lab 06/03/16 0612  WBC 10.5  HGB 14.6  HCT 44.5  PLT 298   ------------------------------------------------------------------------------------------------------------------  Chemistries   Recent Labs Lab 06/02/16 2203  06/05/16 0501  NA 140  < > 141  K 3.5  < > 2.6*  CL 108  < > 115*  CO2 15*  < > 22  GLUCOSE 69  < > 111*  BUN 23*  < > 9  CREATININE 1.14  < > 0.71  CALCIUM 9.0  < > 8.3*  AST 32  --   --   ALT 18  --   --   ALKPHOS 76  --   --   BILITOT 1.4*  --   --   < > = values in this interval not displayed. ------------------------------------------------------------------------------------------------------------------  Cardiac Enzymes No results for input(s): TROPONINI in the last 168 hours. ------------------------------------------------------------------------------------------------------------------  RADIOLOGY:  Dg Abd 1 View  Result Date: 06/04/2016 CLINICAL DATA:  Ileus EXAM: ABDOMEN - 1 VIEW COMPARISON:  CT abdomen and pelvis June 02, 2016 FINDINGS: The sigmoid and rectum are distended and filled with stool. There is less pronounced dilatation of bowel elsewhere.  No free air evident. There is evidence of hip dysplasia on the left with subluxation of the left femoral head with respect the acetabulum. Bones are osteoporotic. Spinal dysraphism is better seen on CT but evident on the current examination. IMPRESSION: Persistent distention of the  rectum and sigmoid with stool and air, not appreciably changed from recent CT examination. No free air. No progression of bowel dilatation. Evidence of spinal dysraphism with left acetabular dysplasia. Bones osteoporotic. Electronically Signed   By: Lowella Grip III M.D.   On: 06/04/2016 08:17   Dg Abd 2 Views  Result Date: 06/05/2016 CLINICAL DATA:  Nausea, vomiting and diarrhea and upper abdominal pain since 06/02/2016. EXAM: ABDOMEN - 2 VIEW COMPARISON:  06/04/2016 FINDINGS: Persistent marked distention of the rectum and sigmoid colon which is filled with stool. Moderate stool throughout the rest of the coal and moderate air distention. There are some air-filled loops of small bowel also. Stable deformity of the spine, pelvis and hips. IMPRESSION: Persistent constipation and fecal impaction. Electronically Signed   By: Marijo Sanes M.D.   On: 06/05/2016 10:05     ASSESSMENT AND PLAN:   50 year old male with history of spina bifida, status post urostomy, history of decubitus ulcer stage II who presented to the hospital due to abdominal pain nausea and vomiting.  1. Abdominal pain nausea vomiting- due to fecal impaction/constipation.  CT scan did show evidence of gastric outlet obstruction but this Has been ruled out now. -Status post endoscopy yesterday and no evidence of gastric outlet obstruction. Patient was noted to have esophageal ulcers with severe esophagitis. As per GI continue PPI twice a day. - Abdominal X-ray showing fecal impaction again today. Will give Fleets enema today and monitor response.   - cont IV Zofran, REglan for Nausea/vomiting.   2. SIRS - ruled out.  Thought to be due to UTI. Pt is s/p Urostomy and likely colonized and therefore UA abnormal.  - Urine cultures suggestive of contamination and off abx now - afebrile, hemodynamically stable.   3. Hypokalemia - due to N/V. Will replace potassium IV as pt. Cannot tolerate PO.  - check Mg in a.m.   4. AKI - due to  dehydration.  - improved w/ IV fluid hydration and will monitor.   5. Hypernatremia -resolved w/ D5W and will monitor.   All the records are reviewed and case discussed with Care Management/Social Worker. Management plans discussed with the patient, family and they are in agreement.  CODE STATUS: Full  DVT Prophylaxis: Lovenox  TOTAL TIME TAKING CARE OF THIS PATIENT: 30 minutes.   POSSIBLE D/C IN 1-2 DAYS, DEPENDING ON CLINICAL CONDITION.   Henreitta Leber M.D on 06/05/2016 at 1:43 PM  Between 7am to 6pm - Pager - [phone]  After 6pm go to [email or web address] - Technical brewer  [facility]  Office  [phone]  CC: Primary care physician; No PCP Per Patient

## 2016-06-06 ENCOUNTER — Inpatient Hospital Stay: Payer: Medicare Other

## 2016-06-06 LAB — BASIC METABOLIC PANEL
Anion gap: 5 (ref 5–15)
BUN: 9 mg/dL (ref 6–20)
CALCIUM: 8.3 mg/dL — AB (ref 8.9–10.3)
CO2: 20 mmol/L — AB (ref 22–32)
CREATININE: 0.81 mg/dL (ref 0.61–1.24)
Chloride: 115 mmol/L — ABNORMAL HIGH (ref 101–111)
GLUCOSE: 127 mg/dL — AB (ref 65–99)
Potassium: 3.3 mmol/L — ABNORMAL LOW (ref 3.5–5.1)
Sodium: 140 mmol/L (ref 135–145)

## 2016-06-06 LAB — MAGNESIUM: Magnesium: 2.3 mg/dL (ref 1.7–2.4)

## 2016-06-06 MED ORDER — NYSTATIN 100000 UNIT/ML MT SUSP
5.0000 mL | Freq: Four times a day (QID) | OROMUCOSAL | Status: DC
  Administered 2016-06-06 – 2016-06-08 (×6): 500000 [IU] via ORAL
  Filled 2016-06-06 (×14): qty 5

## 2016-06-06 NOTE — Plan of Care (Signed)
Problem: Nutrition: Goal: Adequate nutrition will be maintained Outcome: Progressing Refused po intake/meds earlier today due to complaints of nausea and vomiting.  Pt informed of impaction and need to take meds to help clear stool from bowels.  Cooperative with taking lactulose this shift.

## 2016-06-06 NOTE — Plan of Care (Signed)
Problem: Safety: Goal: Ability to remain free from injury will improve Outcome: Progressing Remains on bedrest.  Cooperative with repositioning this shift.  Remains incontinent of liquid stools this shift.  Urostomy bag secured and connected with adapter to catheter drainage bag.

## 2016-06-06 NOTE — Progress Notes (Signed)
MD wants patient to have an enema. Pt agrees but doesn't want Rn to administered it now. I will continue to encourage patient to take enema. I will continue to assess.

## 2016-06-06 NOTE — Progress Notes (Signed)
Pt refuses Lovenox injection.  States too painful to take in lateral flank areas.  Explanation offered for rationale for medication.  Continues to decline dose.

## 2016-06-06 NOTE — Progress Notes (Signed)
[facility] at [place] NAME: [name]    MR#:  [id number]  DATE OF BIRTH:  06/03/1966  SUBJECTIVE:   She continues to have persistent nausea and vomiting. Still has some abdominal distention. KUB this morning showing persistent fecal impaction. Patient did have bowel movements with lactulose yesterday, we'll attempt to give fleets enema today.   REVIEW OF SYSTEMS:    Review of Systems  Constitutional: Negative for chills and fever.  HENT: Negative for congestion and tinnitus.   Eyes: Negative for blurred vision and double vision.  Respiratory: Negative for cough, shortness of breath and wheezing.   Cardiovascular: Negative for chest pain, orthopnea and PND.  Gastrointestinal: Positive for abdominal pain, nausea and vomiting. Negative for diarrhea.  Genitourinary: Negative for dysuria and hematuria.  Neurological: Negative for dizziness, sensory change and focal weakness.  All other systems reviewed and are negative.   Nutrition: Clear liquids Tolerating Diet: NO Tolerating PT: pt. bedbound due to Spina-bifida  DRUG ALLERGIES:   Allergies  Allergen Reactions  . Ivp Dye [Iodinated Diagnostic Agents] Hives  . Morphine And Related Nausea And Vomiting  . Sulfa Antibiotics Other (See Comments)    Reaction: unknown  . Latex Rash    VITALS:  Blood pressure 138/82, pulse 76, temperature 98 F (36.7 C), temperature source Oral, resp. rate 19, height 4\' 11"  (1.499 m), weight 42.6 kg (93 lb 14.4 oz), SpO2 100 %.  PHYSICAL EXAMINATION:   Physical Exam  GENERAL:  50 y.o.-year-old patient lying in bed in mild distress. EYES: Pupils equal, round, reactive to light and accommodation. No scleral icterus. Extraocular muscles intact.  HEENT: Head atraumatic, normocephalic. Oropharynx and nasopharynx clear.  NECK:  Supple, no jugular venous distention. No thyroid enlargement, no tenderness.  LUNGS: Normal breath sounds bilaterally, no  wheezing, rales, rhonchi. No use of accessory muscles of respiration.  CARDIOVASCULAR: S1, S2 normal. No murmurs, rubs, or gallops.  ABDOMEN: Soft, nontender, Distended. Hypoactive Bowel sounds. No organomegaly or mass.  EXTREMITIES: No cyanosis, clubbing or edema b/l.   Contracted and muscular atrophy due to Spina Bifida.   NEUROLOGIC: Cranial nerves II through XII are intact. No focal Motor or sensory deficits b/l. Globally weak   PSYCHIATRIC: The patient is alert and oriented x 3.  SKIN: No obvious rash, lesion, or ulcer.    LABORATORY PANEL:   CBC  Recent Labs Lab 06/03/16 0612  WBC 10.5  HGB 14.6  HCT 44.5  PLT 298   ------------------------------------------------------------------------------------------------------------------  Chemistries   Recent Labs Lab 06/02/16 2203  06/06/16 0423  NA 140  < > 140  K 3.5  < > 3.3*  CL 108  < > 115*  CO2 15*  < > 20*  GLUCOSE 69  < > 127*  BUN 23*  < > 9  CREATININE 1.14  < > 0.81  CALCIUM 9.0  < > 8.3*  MG  --   --  2.3  AST 32  --   --   ALT 18  --   --   ALKPHOS 76  --   --   BILITOT 1.4*  --   --   < > = values in this interval not displayed. ------------------------------------------------------------------------------------------------------------------  Cardiac Enzymes No results for input(s): TROPONINI in the last 168 hours. ------------------------------------------------------------------------------------------------------------------  RADIOLOGY:  Dg Abd 1 View  Result Date: 06/06/2016 CLINICAL DATA:  Abdominal distension. EXAM: ABDOMEN - 1 VIEW COMPARISON:  June 04, 2016 FINDINGS: Persistent marked distension of the  rectum and sigmoid colon which is still with stool. Moderate bowel content is identified throughout the rest the colon which is also air-filled. There are probably a few air-filled small bowel loops. Chronic deformity of the spine, pelvis and hips are unchanged. IMPRESSION: Persistent fecal  impaction unchanged. Electronically Signed   By: Abelardo Diesel M.D.   On: 06/06/2016 08:01   Dg Abd 2 Views  Result Date: 06/05/2016 CLINICAL DATA:  Nausea, vomiting and diarrhea and upper abdominal pain since 06/02/2016. EXAM: ABDOMEN - 2 VIEW COMPARISON:  06/04/2016 FINDINGS: Persistent marked distention of the rectum and sigmoid colon which is filled with stool. Moderate stool throughout the rest of the coal and moderate air distention. There are some air-filled loops of small bowel also. Stable deformity of the spine, pelvis and hips. IMPRESSION: Persistent constipation and fecal impaction. Electronically Signed   By: Marijo Sanes M.D.   On: 06/05/2016 10:05     ASSESSMENT AND PLAN:   50 year old male with history of spina bifida, status post urostomy, history of decubitus ulcer stage II who presented to the hospital due to abdominal pain nausea and vomiting.  1. Abdominal pain nausea vomiting- due to fecal impaction/constipation.  CT scan did show evidence of gastric outlet obstruction but ruled out as Endoscopy was (-).  - Patient was noted to have esophageal ulcers with severe esophagitis. As per GI continue PPI twice a day. - Abdominal X-ray still showing fecal impaction today.  Will give Fleets enema today and cont. Lactulose.  - cont IV Zofran, REglan for Nausea/vomiting.   2. SIRS - ruled out.  Thought to be due to UTI. Pt is s/p Urostomy and likely colonized and therefore UA abnormal.  - Urine cultures suggestive of contamination and off abx now - afebrile, hemodynamically stable.   3. Hypokalemia - improving w/ supplementation.  Mg. Level normal.   4. AKI - due to dehydration.  - resolved w/ IV fluid hydration.   5. Hypernatremia -resolved w/ D5W.   All the records are reviewed and case discussed with Care Management/Social Worker. Management plans discussed with the patient, family and they are in agreement.  CODE STATUS: Full  DVT Prophylaxis: Lovenox  TOTAL TIME  TAKING CARE OF THIS PATIENT: 25 minutes.   POSSIBLE D/C IN 2-3 DAYS, DEPENDING ON CLINICAL CONDITION.   Henreitta Leber M.D on 06/06/2016 at 12:28 PM  Between 7am to 6pm - Pager - [phone]  After 6pm go to [email or web address] - [employer] [facility]  Office  [phone]  CC: Primary care physician; No PCP Per Patient

## 2016-06-07 LAB — POTASSIUM: POTASSIUM: 2.9 mmol/L — AB (ref 3.5–5.1)

## 2016-06-07 MED ORDER — POTASSIUM CHLORIDE CRYS ER 20 MEQ PO TBCR
40.0000 meq | EXTENDED_RELEASE_TABLET | Freq: Once | ORAL | Status: AC
Start: 2016-06-07 — End: 2016-06-07
  Administered 2016-06-07: 40 meq via ORAL
  Filled 2016-06-07: qty 2

## 2016-06-07 NOTE — Care Management (Signed)
Patient states he has begun to have multiple BM's. Diet advanced. Patient states he is bedbound. Following.

## 2016-06-07 NOTE — Progress Notes (Signed)
Pt alert and oriented. Pt had multiple bowel movements this shift and belly is now soft. Pt is now ordered a regular diet. K level has decreased from 3.3 to 2.9. Administered K tablets Po, and pt is getting a continuous Kcl infusion. Next K labs tomorrow morning.

## 2016-06-07 NOTE — Progress Notes (Signed)
[facility] at [place] NAME: [name]    MRN#:  [id number]  DATE OF BIRTH:  [birth year]  SUBJECTIVE:  Hospital Day: 4 days [name] is a 50 y.o. male presenting with Abdominal Pain; Nausea; Emesis; and Diarrhea .   Overnight events: No overnight events Interval Events: Abdominal pain resolved having bowel movements  REVIEW OF SYSTEMS:  CONSTITUTIONAL: No fever, fatigue or weakness.  EYES: No blurred or double vision.  EARS, NOSE, AND THROAT: No tinnitus or ear pain.  RESPIRATORY: No cough, shortness of breath, wheezing or hemoptysis.  CARDIOVASCULAR: No chest pain, orthopnea, edema.  GASTROINTESTINAL: No nausea, vomiting, diarrhea or abdominal pain.  GENITOURINARY: No dysuria, hematuria.  ENDOCRINE: No polyuria, nocturia,  HEMATOLOGY: No anemia, easy bruising or bleeding SKIN: No rash or lesion. MUSCULOSKELETAL: No joint pain or arthritis.   NEUROLOGIC: No tingling, numbness, weakness.  PSYCHIATRY: No anxiety or depression.   DRUG ALLERGIES:   Allergies  Allergen Reactions  . Ivp Dye [Iodinated Diagnostic Agents] Hives  . Morphine And Related Nausea And Vomiting  . Sulfa Antibiotics Other (See Comments)    Reaction: unknown  . Latex Rash    VITALS:  Blood pressure 136/83, pulse 79, temperature 97.7 F (36.5 C), temperature source Oral, resp. rate 16, height 4\' 11"  (1.499 m), weight 42.6 kg (93 lb 14.4 oz), SpO2 100 %.  PHYSICAL EXAMINATION:  VITAL SIGNS: Vitals:   06/07/16 0442 06/07/16 1115  BP: 121/71 136/83  Pulse: 69 79  Resp: 16   Temp: 97.9 F (36.6 C) 97.7 F (36.5 C)   GENERAL:49 y.o.male currently in no acute distress.  HEAD: Normocephalic, atraumatic.  EYES: Pupils equal, round, reactive to light. Extraocular muscles intact. No scleral icterus.  MOUTH: Moist mucosal membrane. Dentition intact. No abscess noted.  EAR, NOSE, THROAT: Clear without exudates. No external lesions.  NECK:  Supple. No thyromegaly. No nodules. No JVD.  PULMONARY: Clear to ascultation, without wheeze rails or rhonci. No use of accessory muscles, Good respiratory effort. good air entry bilaterally CHEST: Nontender to palpation.  CARDIOVASCULAR: S1 and S2. Regular rate and rhythm. No murmurs, rubs, or gallops. No edema. Pedal pulses 2+ bilaterally.  GASTROINTESTINAL: Soft, nontender, nondistended. No masses. Positive bowel sounds. No hepatosplenomegaly.  MUSCULOSKELETAL: No swelling, clubbing, or edema. Bilateral lower extremity contractures and atrophy  NEUROLOGIC: Cranial nerves II through XII are intact. No gross focal neurological deficits. Sensation intact. Reflexes intact.  SKIN: No ulceration, lesions, rashes, or cyanosis. Skin warm and dry. Turgor intact.  PSYCHIATRIC: Mood, affect within normal limits. The patient is awake, alert and oriented x 3. Insight, judgment intact.      LABORATORY PANEL:   CBC  Recent Labs Lab 06/03/16 0612  WBC 10.5  HGB 14.6  HCT 44.5  PLT 298   ------------------------------------------------------------------------------------------------------------------  Chemistries   Recent Labs Lab 06/02/16 2203  06/06/16 0423 06/07/16 0427  NA 140  < > 140  --   K 3.5  < > 3.3* 2.9*  CL 108  < > 115*  --   CO2 15*  < > 20*  --   GLUCOSE 69  < > 127*  --   BUN 23*  < > 9  --   CREATININE 1.14  < > 0.81  --   CALCIUM 9.0  < > 8.3*  --   MG  --   --  2.3  --   AST 32  --   --   --  ALT 18  --   --   --   ALKPHOS 76  --   --   --   BILITOT 1.4*  --   --   --   < > = values in this interval not displayed. ------------------------------------------------------------------------------------------------------------------  Cardiac Enzymes No results for input(s): TROPONINI in the last 168 hours. ------------------------------------------------------------------------------------------------------------------  RADIOLOGY:  Dg Abd 1 View  Result Date:  06/06/2016 CLINICAL DATA:  Abdominal distension. EXAM: ABDOMEN - 1 VIEW COMPARISON:  June 04, 2016 FINDINGS: Persistent marked distension of the rectum and sigmoid colon which is still with stool. Moderate bowel content is identified throughout the rest the colon which is also air-filled. There are probably a few air-filled small bowel loops. Chronic deformity of the spine, pelvis and hips are unchanged. IMPRESSION: Persistent fecal impaction unchanged. Electronically Signed   By: Abelardo Diesel M.D.   On: 06/06/2016 08:01    EKG:   Orders placed or performed during the hospital encounter of 06/02/16  . ED EKG  . EKG 12-Lead  . EKG 12-Lead  . ED EKG    ASSESSMENT AND PLAN:   [name] is a 50 y.o. male presenting with Abdominal Pain; Nausea; Emesis; and Diarrhea . Admitted 06/02/2016 : Day #: 4 days   1. Abdominal pain nausea vomiting- due to fecal impaction/constipation.  - Patient was noted to have esophageal ulcers with severe esophagitis. As per GI continue PPI twice a day. Patient successfully having bowel movements, and advance diet  2. SIRS - ruled out.    3. Hypokalemia - improving w/ supplementation.  Mg. Level normal.   4. AKI - due to dehydration. If tolerates diet. IV fluids  5. Hypernatremia -resolved w/ D5W.     All the records are reviewed and case discussed with Care Management/Social Workerr. Management plans discussed with the patient, family and they are in agreement.  CODE STATUS: full TOTAL TIME TAKING CARE OF THIS PATIENT: 28 minutes.   POSSIBLE D/C IN 1-2DAYS, DEPENDING ON CLINICAL CONDITION.   Hower,  Karenann Cai.D on 06/07/2016 at 11:46 AM  Between 7am to 6pm - Pager - [phone]  After 6pm: House Pager: - [phone]  [facility]  Office  [phone]  CC: Primary care physician; No PCP Per Patient

## 2016-06-07 NOTE — Care Management Important Message (Signed)
Important Message  Patient Details  Name: [name] MRN: [id number] Date of Birth: [birth year]   Medicare Important Message Given:  Yes    Jolly Mango, RN 06/07/2016, 9:28 AM

## 2016-06-07 NOTE — Care Management (Signed)
CM assessment for discharge planning. Presents from home with a parent. HX: spina bifida. Followed by [facility] for SN, PT, OT and SW.  [facility] notified of admission. Persistent abdominal pain with nausea and vomiting due to fecal impaction. Following.

## 2016-06-07 NOTE — Care Management (Signed)
Case discussed with attending. It is anticipated that patient will be ready for discharge tomorrow. Following.

## 2016-06-08 LAB — BASIC METABOLIC PANEL
ANION GAP: 7 (ref 5–15)
CALCIUM: 8.1 mg/dL — AB (ref 8.9–10.3)
CO2: 14 mmol/L — ABNORMAL LOW (ref 22–32)
Chloride: 120 mmol/L — ABNORMAL HIGH (ref 101–111)
Creatinine, Ser: 0.76 mg/dL (ref 0.61–1.24)
GFR calc Af Amer: >60 mL/min (ref >60)
Glucose, Bld: 94 mg/dL (ref 65–99)
POTASSIUM: 4.4 mmol/L (ref 3.5–5.1)
SODIUM: 141 mmol/L (ref 135–145)

## 2016-06-08 LAB — CULTURE, BLOOD (ROUTINE X 2)
CULTURE: NO GROWTH
Culture: NO GROWTH

## 2016-06-08 NOTE — Discharge Instructions (Signed)
Constipation, Adult Constipation is when a person:  Poops (has a bowel movement) less than 3 times a week.  Has a hard time pooping.  Has poop that is dry, hard, or bigger than normal. HOME CARE   Eat foods with a lot of fiber in them. This includes fruits, vegetables, beans, and whole grains such as brown rice.  Avoid fatty foods and foods with a lot of sugar. This includes french fries, hamburgers, cookies, candy, and soda.  If you are not getting enough fiber from food, take products with added fiber in them (supplements).  Drink enough fluid to keep your pee (urine) clear or pale yellow.  Exercise on a regular basis, or as told by your doctor.  Go to the restroom when you feel like you need to poop. Do not hold it.  Only take medicine as told by your doctor. Do not take medicines that help you poop (laxatives) without talking to your doctor first. GET HELP RIGHT AWAY IF:   You have bright red blood in your poop (stool).  Your constipation lasts more than 4 days or gets worse.  You have belly (abdominal) or butt (rectal) pain.  You have thin poop (as thin as a pencil).  You lose weight, and it cannot be explained. MAKE SURE YOU:   Understand these instructions.  Will watch your condition.  Will get help right away if you are not doing well or get worse.   This information is not intended to replace advice given to you by your health care provider. Make sure you discuss any questions you have with your health care provider.   Document Released: 03/08/2008 Document Revised: 10/11/2014 Document Reviewed: 07/02/2013 Elsevier Interactive Patient Education 2016 Elsevier Inc.  

## 2016-06-08 NOTE — Progress Notes (Signed)
A & O. Takes meds ok. Foley. IV and tele removed. Discharge instructions given to pt. EMS called to take pt home. HH was notified that pt going home. Pt has no further concerns at this time.

## 2016-06-08 NOTE — Care Management Important Message (Signed)
Important Message  Patient Details  Name: [name] MRN: [id number] Date of Birth: [birth year]   Medicare Important Message Given:  Yes    Jolly Mango, RN 06/08/2016, 2:42 PM

## 2016-06-08 NOTE — Progress Notes (Signed)
Abdomen much softer this shift.  Stools tapering compared to past 2 night shifts.  Tolerated po intake without complaints of nausea/vomiting.

## 2016-06-08 NOTE — Care Management Note (Signed)
Case Management Note  Patient Details  Name: [name] MRN: [id number] Date of Birth: Jan 10, 1966  Subjective/Objective: Resumption of care orders in for [facility] for SN, PT, OT and SW. Notified [facility] of discharge. Medical Necessity completed for EMS transport home.                  Action/Plan:   Expected Discharge Date:     06/08/2016             Expected Discharge Plan:  [place]  In-House Referral:     Discharge planning Services  CM Consult  Post Acute Care Choice:  Home Health, Resumption of Svcs/PTA Provider Choice offered to:     DME Arranged:    DME Agency:     HH Arranged:  RN, PT, OT, Social Work [employer] Agency:  [employer] (now [facility])  Status of Service:  Completed, signed off  If discussed at [employer], dates discussed:    Additional Comments:  Jolly Mango, RN 06/08/2016, 2:43 PM

## 2016-06-10 NOTE — Discharge Summary (Signed)
[name] at [place] NAME: [name]    MR#:  [id number]  DATE OF BIRTH:  01/10/1966  DATE OF ADMISSION:  06/02/2016   ADMITTING PHYSICIAN: Saundra Shelling, MD  DATE OF DISCHARGE: 06/08/2016  4:00 PM  PRIMARY CARE PHYSICIAN: Norva Riffle, MD   ADMISSION DIAGNOSIS:  Nausea vomiting and diarrhea [R11.2, R19.7] DISCHARGE DIAGNOSIS:  Principal Problem:   Nausea & vomiting Active Problems:   Gastric outlet obstruction   SIRS (systemic inflammatory response syndrome) (HCC)   Pressure ulcer   Gastritis   Ulcer of esophagus with bleeding   Nausea with vomiting   Nausea vomiting and diarrhea  SECONDARY DIAGNOSIS:   Past Medical History:  Diagnosis Date  . Decubitus ulcer   . Renal disorder   . Spina bifida [facility])    HOSPITAL COURSE:  [name] is a 50 y.o. male presenting with Abdominal Pain; Nausea; Emesis; and Diarrhea  1. Abdominal pain nausea vomiting-due to fecal impaction/constipation.  - Patient was noted to have esophageal ulcers with severe esophagitis. As per GI continue PPI twice a day. Patient successfully having bowel movements, and advance diet  2. SIRS - ruled out.   3. Hypokalemia - repleted and resolved  4.  Acute renal failure - prerenal due to dehydration and resolved  5. Hypernatremia -resolved w/ D5W.   DISCHARGE CONDITIONS:  Stable CONSULTS OBTAINED:  Treatment Team:  [name], MD DRUG ALLERGIES:   Allergies  Allergen Reactions  . Ivp Dye [Iodinated Diagnostic Agents] Hives  . Morphine And Related Nausea And Vomiting  . Sulfa Antibiotics Other (See Comments)    Reaction: unknown  . Latex Rash   DISCHARGE MEDICATIONS:     Medication List    TAKE these medications   feeding supplement (ENSURE ENLIVE) Liqd Take 237 mLs by mouth 2 (two) times daily between meals.      DISCHARGE INSTRUCTIONS:   DIET:  Regular diet DISCHARGE CONDITION:  Good ACTIVITY:    Activity as tolerated OXYGEN:  Home Oxygen: No.  Oxygen Delivery: room air DISCHARGE LOCATION:  home   If you experience worsening of your admission symptoms, develop shortness of breath, life threatening emergency, suicidal or homicidal thoughts you must seek medical attention immediately by calling 911 or calling your MD immediately  if symptoms less severe.  You Must read complete instructions/literature along with all the possible adverse reactions/side effects for all the Medicines you take and that have been prescribed to you. Take any new Medicines after you have completely understood and accpet all the possible adverse reactions/side effects.   Please note  You were cared for by a hospitalist during your hospital stay. If you have any questions about your discharge medications or the care you received while you were in the hospital after you are discharged, you can call the unit and asked to speak with the hospitalist on call if the hospitalist that took care of you is not available. Once you are discharged, your primary care physician will handle any further medical issues. Please note that NO REFILLS for any discharge medications will be authorized once you are discharged, as it is imperative that you return to your primary care physician (or establish a relationship with a primary care physician if you do not have one) for your aftercare needs so that they can reassess your need for medications and monitor your lab values.    On the day of Discharge:  VITAL SIGNS:  Blood pressure 121/77, pulse  84, temperature 97.6 F (36.4 C), temperature source Oral, resp. rate 12, height 4\' 11"  (1.499 m), weight 42.6 kg (93 lb 14.4 oz), SpO2 100 %. PHYSICAL EXAMINATION:  GENERAL:  50 y.o.-year-old patient lying in the bed with no acute distress.  EYES: Pupils equal, round, reactive to light and accommodation. No scleral icterus. Extraocular muscles intact.  HEENT: Head atraumatic,  normocephalic. Oropharynx and nasopharynx clear.  NECK:  Supple, no jugular venous distention. No thyroid enlargement, no tenderness.  LUNGS: Normal breath sounds bilaterally, no wheezing, rales,rhonchi or crepitation. No use of accessory muscles of respiration.  CARDIOVASCULAR: S1, S2 normal. No murmurs, rubs, or gallops.  ABDOMEN: Soft, non-tender, non-distended. Bowel sounds present. No organomegaly or mass.  EXTREMITIES: No pedal edema, cyanosis, or clubbing.  NEUROLOGIC: Cranial nerves II through XII are intact. Muscle strength 5/5 in all extremities. Sensation intact. Gait not checked.  PSYCHIATRIC: The patient is alert and oriented x 3.  SKIN: No obvious rash, lesion, or ulcer.  DATA REVIEW:   CBC No results for input(s): WBC, HGB, HCT, PLT in the last 168 hours.  Chemistries   Recent Labs Lab 06/06/16 0423  06/08/16 0444  NA 140  --  141  K 3.3*  < > 4.4  CL 115*  --  120*  CO2 20*  --  14*  GLUCOSE 127*  --  94  BUN 9  --  <5*  CREATININE 0.81  --  0.76  CALCIUM 8.3*  --  8.1*  MG 2.3  --   --   < > = values in this interval not displayed.    Follow-up Information    MINTZER, MELANIE, MD. Schedule an appointment as soon as possible for a visit in 1 week(s).   Why:  You will need to call for an appointment, ccs Contact information: [address] [place] [address] [phone]           Management plans discussed with the patient, family and they are in agreement.  CODE STATUS: Full code  TOTAL TIME TAKING CARE OF THIS PATIENT: 35 minutes.    [facility], Satish Hammers M.D on 06/10/2016 at 3:48 PM  Between 7am to 6pm - Pager - [phone]  After 6pm go to [email or web address] - Proofreader  [facility] [place]  Office  [phone]  CC: Primary care physician; Norva Riffle, MD   Note: This dictation was prepared with Dragon dictation along with smaller phrase technology. Any transcriptional errors that result from this  process are unintentional.

## 2016-06-17 ENCOUNTER — Encounter: Payer: Self-pay | Admitting: Gastroenterology

## 2016-10-09 ENCOUNTER — Encounter: Payer: Self-pay | Admitting: Emergency Medicine

## 2016-10-09 ENCOUNTER — Inpatient Hospital Stay
Admission: EM | Admit: 2016-10-09 | Discharge: 2016-10-11 | DRG: 389 | Disposition: A | Discharge status: Home / Self Care | Payer: Medicare Other | Attending: Internal Medicine | Admitting: Internal Medicine

## 2016-10-09 ENCOUNTER — Emergency Department: Payer: Medicare Other

## 2016-10-09 DIAGNOSIS — K5641 Fecal impaction: Principal | ICD-10-CM | POA: Diagnosis present

## 2016-10-09 DIAGNOSIS — N390 Urinary tract infection, site not specified: Secondary | ICD-10-CM | POA: Diagnosis present

## 2016-10-09 DIAGNOSIS — E876 Hypokalemia: Secondary | ICD-10-CM

## 2016-10-09 DIAGNOSIS — K56609 Unspecified intestinal obstruction, unspecified as to partial versus complete obstruction: Secondary | ICD-10-CM

## 2016-10-09 DIAGNOSIS — Z91041 Radiographic dye allergy status: Secondary | ICD-10-CM

## 2016-10-09 DIAGNOSIS — N132 Hydronephrosis with renal and ureteral calculous obstruction: Secondary | ICD-10-CM | POA: Diagnosis present

## 2016-10-09 DIAGNOSIS — Z885 Allergy status to narcotic agent status: Secondary | ICD-10-CM

## 2016-10-09 DIAGNOSIS — Q059 Spina bifida, unspecified: Secondary | ICD-10-CM

## 2016-10-09 DIAGNOSIS — N319 Neuromuscular dysfunction of bladder, unspecified: Secondary | ICD-10-CM | POA: Diagnosis present

## 2016-10-09 DIAGNOSIS — Z91040 Latex allergy status: Secondary | ICD-10-CM

## 2016-10-09 DIAGNOSIS — R195 Other fecal abnormalities: Secondary | ICD-10-CM

## 2016-10-09 DIAGNOSIS — K311 Adult hypertrophic pyloric stenosis: Secondary | ICD-10-CM | POA: Diagnosis present

## 2016-10-09 DIAGNOSIS — D72829 Elevated white blood cell count, unspecified: Secondary | ICD-10-CM

## 2016-10-09 DIAGNOSIS — R140 Abdominal distension (gaseous): Secondary | ICD-10-CM

## 2016-10-09 DIAGNOSIS — R159 Full incontinence of feces: Secondary | ICD-10-CM | POA: Diagnosis present

## 2016-10-09 DIAGNOSIS — N99538 Other complication of other stoma of urinary tract: Secondary | ICD-10-CM | POA: Diagnosis present

## 2016-10-09 DIAGNOSIS — Z882 Allergy status to sulfonamides status: Secondary | ICD-10-CM

## 2016-10-09 LAB — URINALYSIS, COMPLETE (UACMP) WITH MICROSCOPIC
BACTERIA UA: NONE SEEN
BILIRUBIN URINE: NEGATIVE
Glucose, UA: NEGATIVE mg/dL
Ketones, ur: 5 mg/dL — AB
NITRITE: NEGATIVE
PH: 8 (ref 5.0–8.0)
Protein, ur: 100 mg/dL — AB
SPECIFIC GRAVITY, URINE: 1.008 (ref 1.005–1.030)
Squamous Epithelial / LPF: NONE SEEN

## 2016-10-09 LAB — COMPREHENSIVE METABOLIC PANEL
ALBUMIN: 4 g/dL (ref 3.5–5.0)
ALK PHOS: 102 U/L (ref 38–126)
ALT: 12 U/L — ABNORMAL LOW (ref 17–63)
AST: 22 U/L (ref 15–41)
Anion gap: 12 (ref 5–15)
BILIRUBIN TOTAL: 0.8 mg/dL (ref 0.3–1.2)
BUN: 17 mg/dL (ref 6–20)
CALCIUM: 9.4 mg/dL (ref 8.9–10.3)
CO2: 20 mmol/L — ABNORMAL LOW (ref 22–32)
Chloride: 104 mmol/L (ref 101–111)
Creatinine, Ser: 0.95 mg/dL (ref 0.61–1.24)
GFR calc Af Amer: >60 mL/min (ref >60)
GFR calc non Af Amer: >60 mL/min (ref >60)
GLUCOSE: 87 mg/dL (ref 65–99)
Potassium: 3.2 mmol/L — ABNORMAL LOW (ref 3.5–5.1)
Sodium: 136 mmol/L (ref 135–145)
TOTAL PROTEIN: 8.3 g/dL — AB (ref 6.5–8.1)

## 2016-10-09 LAB — CBC
HEMATOCRIT: 43.6 % (ref 40.0–52.0)
Hemoglobin: 14 g/dL (ref 13.0–18.0)
MCH: 27.3 pg (ref 26.0–34.0)
MCHC: 32.1 g/dL (ref 32.0–36.0)
MCV: 85.1 fL (ref 80.0–100.0)
Platelets: 428 10*3/uL (ref 150–440)
RBC: 5.12 MIL/uL (ref 4.40–5.90)
RDW: 14.8 % — AB (ref 11.5–14.5)
WBC: 18.3 10*3/uL — ABNORMAL HIGH (ref 3.8–10.6)

## 2016-10-09 LAB — LIPASE, BLOOD: Lipase: 23 U/L (ref 11–51)

## 2016-10-09 MED ORDER — BARIUM SULFATE 2.1 % PO SUSP
450.0000 mL | ORAL | Status: AC
  Administered 2016-10-09 (×2): 450 mL via ORAL

## 2016-10-09 MED ORDER — HYDROMORPHONE HCL 1 MG/ML IJ SOLN
0.5000 mg | INTRAMUSCULAR | Status: AC
  Administered 2016-10-09: 0.5 mg via INTRAVENOUS
  Filled 2016-10-09: qty 1

## 2016-10-09 MED ORDER — PROMETHAZINE HCL 25 MG/ML IJ SOLN
12.5000 mg | Freq: Once | INTRAMUSCULAR | Status: AC
  Administered 2016-10-09: 12.5 mg via INTRAMUSCULAR
  Filled 2016-10-09: qty 1

## 2016-10-09 NOTE — ED Notes (Signed)
Report from [place], rn.

## 2016-10-09 NOTE — ED Notes (Signed)
CT notified patient is done with barium sulfate.

## 2016-10-09 NOTE — ED Notes (Signed)
Pt denies needs. Pt placed on enteric precautions by this rn.

## 2016-10-09 NOTE — ED Notes (Signed)
Ct in room with pt giving contrast for study

## 2016-10-09 NOTE — ED Provider Notes (Signed)
[facility] Emergency Department Provider Note   ____________________________________________   First MD Initiated Contact with Patient 10/09/16 1942     (approximate)  I have reviewed the triage vital signs and the nursing notes.   HISTORY  Chief Complaint Diarrhea and Abdominal Pain    HPI [name] is a 51 y.o. male   History of spina bifida. History of multiple abdominal surgeries, urostomy, and cough. Surgical history.  Patient reports that yesterday he is ranging loose stools, very watery, and that he's had about 6-8 episodes of incontinence and stool for the last day. This all started just after receiving an enema, his normal routine in order to have a bowel movement set his family has to administer an enema before occurring. He reports nausea and had quite a bit of discomfort and swelling in his abdomen, but does report he passed a large amount of gas and is feeling somewhat better at this time.  No fevers or chills. No chest pain. No shortness of breath.   Past Medical History:  Diagnosis Date  . Decubitus ulcer   . Renal disorder   . Spina bifida [facility])     Patient Active Problem List   Diagnosis Date Noted  . Nausea & vomiting 06/03/2016  . Gastric outlet obstruction 06/03/2016  . SIRS (systemic inflammatory response syndrome) ([place]) 06/03/2016  . Pressure ulcer 06/03/2016  . Impaction of the bowels ([place])   . Gastritis   . Ulcer of esophagus with bleeding   . Nausea with vomiting   . Nausea vomiting and diarrhea   . Sepsis ([place]) 04/21/2016    Past Surgical History:  Procedure Laterality Date  . ABDOMINAL SURGERY    . BACK SURGERY    . COLON SURGERY    . ESOPHAGOGASTRODUODENOSCOPY (EGD) WITH PROPOFOL N/A 06/03/2016   Procedure: ESOPHAGOGASTRODUODENOSCOPY (EGD) WITH PROPOFOL;  Surgeon: [name], MD;  Location: [facility] ENDOSCOPY;  Service: Endoscopy;  Laterality: N/A;  . ilieostomy    . VENTRICULOPERITONEAL SHUNT    . vp  shunt removal      Prior to Admission medications   Medication Sig Start Date End Date Taking? Authorizing Provider  feeding supplement, ENSURE ENLIVE, (ENSURE ENLIVE) LIQD Take 237 mLs by mouth 2 (two) times daily between meals. 04/26/16   Fritzi Mandes, MD    Allergies Ivp dye [iodinated diagnostic agents]; Morphine and related; Sulfa antibiotics; and Latex  Family History  Problem Relation Age of Onset  . Diabetes Mellitus II Father     Social History Social History  Substance Use Topics  . Smoking status: Never Smoker  . Smokeless tobacco: Never Used  . Alcohol use No    Review of Systems Constitutional: No fever/chills Eyes: No visual changes. ENT: No sore throat. Cardiovascular: Denies chest pain. Respiratory: Denies shortness of breath. Gastrointestinal:   No constipation. Genitourinary: Patient has urostomy. Reports that no significant change in output, he has not noticed an abnormal odor and reports it is always cloudy, and looks better recently than has in the past. Musculoskeletal: Negative for back pain. Skin: Negative for rash. Neurological: Negative for headaches, focal weakness or numbness.  10-point ROS otherwise negative.  ____________________________________________   PHYSICAL EXAM:  VITAL SIGNS: ED Triage Vitals  Enc Vitals Group     BP --      Pulse Rate 10/09/16 1930 (!) 109     Resp 10/09/16 1930 20     Temp 10/09/16 1930 98.1 F (36.7 C)     Temp Source  10/09/16 1930 Axillary     SpO2 --      Weight 10/09/16 1933 90 lb (40.8 kg)     Height 10/09/16 1933 4\' 11"  (1.499 m)     Head Circumference --      Peak Flow --      Pain Score 10/09/16 1934 1     Pain Loc --      Pain Edu? --      Excl. in [place]? --     Constitutional: Alert and oriented. Well appearing and in no acute distress.Chronic appearance of illness. Somewhat cachectic Eyes: Conjunctivae are normal. PERRL. EOMI. Head: Atraumatic. Nose: No congestion/rhinnorhea. Mouth/Throat:  Mucous membranes are moist.  Oropharynx non-erythematous. Neck: No stridor.   Cardiovascular: Normal rate, regular rhythm. Grossly normal heart sounds.  Good peripheral circulation. Respiratory: Normal respiratory effort.  No retractions. Lungs CTAB. Gastrointestinal: Soft and nontender. Somewhat tympanitic and moderately distended throughout. Urostomy draining slightly cloudy urine. Musculoskeletal: No lower extremity tenderness nor edema. Chronic weakness and atrophy in the lower extremities Neurologic:  Normal speech and language. No gross focal neurologic deficits are appreciated except for generalized weakness and atrophy and chronic weakness in the lower extremities which patient reports her chronic.  Skin:  Skin is warm, dry and intact. No rash noted. Psychiatric: Mood and affect are normal. Speech and behavior are normal.  ____________________________________________   LABS (all labs ordered are listed, but only abnormal results are displayed)  Labs Reviewed  LIPASE, BLOOD  COMPREHENSIVE METABOLIC PANEL  CBC  URINALYSIS, COMPLETE (UACMP) WITH MICROSCOPIC   ____________________________________________  EKG   ____________________________________________  RADIOLOGY  Ct Abdomen Pelvis Wo Contrast  Result Date: 10/09/2016 CLINICAL DATA:  Generalized abdominal pain and diarrhea EXAM: CT ABDOMEN AND PELVIS WITHOUT CONTRAST TECHNIQUE: Multidetector CT imaging of the abdomen and pelvis was performed following the standard protocol without IV contrast. COMPARISON:  06/06/2016, CT 06/02/2016, 04/20/2016 FINDINGS: Lower chest: Lung bases demonstrate no acute consolidation or significant pleural effusion. Heart size nonenlarged. Contrast is present within slightly enlarged distal esophagus, which may relate to a small hiatal hernia. Hepatobiliary: No calcified gallstones. No biliary dilatation. No focal hepatic abnormality Pancreas: Unremarkable. No pancreatic ductal dilatation or  surrounding inflammatory changes. Spleen: Normal in size without focal abnormality. Adrenals/Urinary Tract: Adrenal glands within normal limits. Scarring within both kidneys. Moderate chronic bilateral hydronephrosis with multiple intrarenal calculi some of which are layering. A small calculus is seen within the midportion of the urostomy were there is mild saccular dilatation of the ureters. There are small calcifications within the dilated ureters. Stomach/Bowel: No enlarged small bowel or small bowel thickening is visualized. Massive distension of the rectum with transverse diameter of 12 cm. Massive fecal impaction at the rectum with large formed stools present elsewhere within the air filled dilated colon. Vascular/Lymphatic: Non aneurysmal aorta. No grossly enlarged lymph nodes. Reproductive: No masses. Other: Deep sacral ulceration to the posterior perianal region. No free air. No free fluid. 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 Chronic bilateral hip effusions. IMPRESSION: 1. Massively enlarged rectum with large fecal impaction present. Large feces visualized elsewhere in the colon, the colon is moderate to markedly dilated. There is no evidence for small bowel obstruction 2. Bilateral hydronephrosis with chronic bilateral calcifications, status post right lower quadrant urostomy. A 6 mm stone or calcification is now visualized within the mid segment of the urostomy. There are additional punctate calcifications or  stones along the course of the dilated proximal ureters. 3. Deep sacral ulceration to the posterior perianal region. 4. Spinal dysraphism as previously described. Left acetabular and femoral dysplasia with chronically dislocated left hip and chronic bilateral hip effusions Electronically Signed   By: Donavan Foil M.D.   On: 10/09/2016 23:09     ____________________________________________   PROCEDURES  Procedure(s) performed: None  Procedures  Critical Care performed: No  ____________________________________________   INITIAL IMPRESSION / ASSESSMENT AND PLAN / ED COURSE  Pertinent labs & imaging results that were available during my care of the patient were reviewed by me and considered in my medical decision making (see chart for details).  Differential diagnosis includes but is not limited to, abdominal perforation, aortic dissection, cholecystitis, appendicitis, diverticulitis, colitis, esophagitis/gastritis, kidney stone, pyelonephritis, urinary tract infection, aortic aneurysm. All are considered in decision and treatment plan. Based upon the patient's presentation and risk factors, concerned the patient may have an obstruction or large impaction once again based on his previous history.  ----------------------------------------- 12:52 AM on 10/10/2016 -----------------------------------------  Patient cannot be disimpacted due to the extent of impaction and dilation: With minimal attempt. Soapsuds enema given with liquid stool returned but no solid stool. Discussed with Dr. Marius Ditch gastroenterology advised the patient may require admission, possible colon prep/GoLYTELY treatment, and personally given the patient's abdominal discomfort, distended tympanic abdomen with associated impaction. The patient is at high risk for developing obstruction, completely. Discussed with the patient and he will be admitted for ongoing care, further GI care and workup.  He is also no significant leukocytosis, and CT demonstrates urologic pathology as well, however the patient reports his urine appears improved, he still producing normal amounts of urine from his urostomy without a sign of obstruction. Overall, no bacteria are seen in his urine and we will culture his urine, leukocytosis may be reactive the infectious etiology so strongly  considered. Patient will be admitted for ongoing workup.   Clinical Course      ____________________________________________   FINAL CLINICAL IMPRESSION(S) / ED DIAGNOSES  Final diagnoses:  None      NEW MEDICATIONS STARTED DURING THIS VISIT:  New Prescriptions   No medications on file     Note:  This document was prepared using Dragon voice recognition software and may include unintentional dictation errors.     Delman Kitten, MD 10/10/16 [phone]

## 2016-10-09 NOTE — ED Notes (Signed)
Report given to April, RN

## 2016-10-09 NOTE — ED Notes (Signed)
Pt taken to CT.

## 2016-10-09 NOTE — ED Notes (Signed)
This RN to bedside at this time. Pt noted to be resting in bed with TV on, pt's sister and her boyfriend are noted to be at bedside at this time. Will continue to monitor for further patient needs.

## 2016-10-09 NOTE — ED Notes (Signed)
Pt returned from CT °

## 2016-10-09 NOTE — ED Triage Notes (Signed)
Pt arrived to ED via EMS with c/o generalized abd pain and diarrhea for the past 2-3. Worse today. Pt has hx of small bowel obstruction a year ago. +passing gas; states it makes him feel a lot better.  EMS vital signs - 113 FSBS 139/87 HR 120. Followed by [facility].

## 2016-10-09 NOTE — ED Notes (Signed)
Ct notified pt is done drinking po barium.

## 2016-10-10 DIAGNOSIS — D72829 Elevated white blood cell count, unspecified: Secondary | ICD-10-CM | POA: Diagnosis not present

## 2016-10-10 DIAGNOSIS — Z882 Allergy status to sulfonamides status: Secondary | ICD-10-CM | POA: Diagnosis not present

## 2016-10-10 DIAGNOSIS — K5641 Fecal impaction: Secondary | ICD-10-CM | POA: Diagnosis present

## 2016-10-10 DIAGNOSIS — N390 Urinary tract infection, site not specified: Secondary | ICD-10-CM | POA: Diagnosis present

## 2016-10-10 DIAGNOSIS — Z91040 Latex allergy status: Secondary | ICD-10-CM | POA: Diagnosis not present

## 2016-10-10 DIAGNOSIS — N319 Neuromuscular dysfunction of bladder, unspecified: Secondary | ICD-10-CM | POA: Diagnosis present

## 2016-10-10 DIAGNOSIS — Z885 Allergy status to narcotic agent status: Secondary | ICD-10-CM | POA: Diagnosis not present

## 2016-10-10 DIAGNOSIS — N99538 Other complication of other stoma of urinary tract: Secondary | ICD-10-CM | POA: Diagnosis present

## 2016-10-10 DIAGNOSIS — N132 Hydronephrosis with renal and ureteral calculous obstruction: Secondary | ICD-10-CM | POA: Diagnosis present

## 2016-10-10 DIAGNOSIS — R159 Full incontinence of feces: Secondary | ICD-10-CM | POA: Diagnosis present

## 2016-10-10 DIAGNOSIS — Q059 Spina bifida, unspecified: Secondary | ICD-10-CM | POA: Diagnosis not present

## 2016-10-10 DIAGNOSIS — Z91041 Radiographic dye allergy status: Secondary | ICD-10-CM | POA: Diagnosis not present

## 2016-10-10 DIAGNOSIS — K311 Adult hypertrophic pyloric stenosis: Secondary | ICD-10-CM | POA: Diagnosis present

## 2016-10-10 DIAGNOSIS — E876 Hypokalemia: Secondary | ICD-10-CM | POA: Diagnosis present

## 2016-10-10 HISTORY — DX: Fecal impaction: K56.41

## 2016-10-10 LAB — C DIFFICILE QUICK SCREEN W PCR REFLEX
C DIFFICILE (CDIFF) INTERP: NOT DETECTED
C DIFFICILE (CDIFF) TOXIN: NEGATIVE
C Diff antigen: NEGATIVE

## 2016-10-10 LAB — GASTROINTESTINAL PANEL BY PCR, STOOL (REPLACES STOOL CULTURE)

## 2016-10-10 LAB — LACTIC ACID, PLASMA
Lactic Acid, Venous: 0.6 mmol/L (ref 0.5–1.9)
Lactic Acid, Venous: 0.9 mmol/L (ref 0.5–1.9)

## 2016-10-10 MED ORDER — POTASSIUM CHLORIDE CRYS ER 20 MEQ PO TBCR
40.0000 meq | EXTENDED_RELEASE_TABLET | Freq: Once | ORAL | Status: AC
  Administered 2016-10-10: 40 meq via ORAL
  Filled 2016-10-10: qty 2

## 2016-10-10 MED ORDER — ONDANSETRON HCL 4 MG/2ML IJ SOLN: 4.0000 mg | Freq: Four times a day (QID) | INTRAMUSCULAR | Status: DC | PRN

## 2016-10-10 MED ORDER — ZOLPIDEM TARTRATE 5 MG PO TABS: 5.0000 mg | ORAL_TABLET | Freq: Every evening | ORAL | Status: DC | PRN

## 2016-10-10 MED ORDER — ACETAMINOPHEN 325 MG PO TABS: 650.0000 mg | ORAL_TABLET | Freq: Four times a day (QID) | ORAL | Status: DC | PRN

## 2016-10-10 MED ORDER — CEFTRIAXONE SODIUM-DEXTROSE 1-3.74 GM-% IV SOLR
1.0000 g | Freq: Every day | INTRAVENOUS | Status: DC
  Administered 2016-10-10 (×2): 1 g via INTRAVENOUS
  Filled 2016-10-10 (×3): qty 50

## 2016-10-10 MED ORDER — POLYETHYLENE GLYCOL 3350 17 G PO PACK
17.0000 g | PACK | Freq: Two times a day (BID) | ORAL | Status: DC
  Administered 2016-10-10 – 2016-10-11 (×3): 17 g via ORAL
  Filled 2016-10-10 (×3): qty 1

## 2016-10-10 MED ORDER — ONDANSETRON HCL 4 MG PO TABS: 4.0000 mg | ORAL_TABLET | Freq: Four times a day (QID) | ORAL | Status: DC | PRN

## 2016-10-10 MED ORDER — HEPARIN SODIUM (PORCINE) 5000 UNIT/ML IJ SOLN
5000.0000 [IU] | Freq: Three times a day (TID) | INTRAMUSCULAR | Status: DC
  Administered 2016-10-10 – 2016-10-11 (×5): 5000 [IU] via SUBCUTANEOUS
  Filled 2016-10-10 (×5): qty 1

## 2016-10-10 MED ORDER — POTASSIUM CHLORIDE IN NACL 20-0.9 MEQ/L-% IV SOLN
INTRAVENOUS | Status: DC
  Administered 2016-10-10 – 2016-10-11 (×3): via INTRAVENOUS
  Filled 2016-10-10 (×6): qty 1000

## 2016-10-10 MED ORDER — ALBUTEROL SULFATE (2.5 MG/3ML) 0.083% IN NEBU: 2.5000 mg | INHALATION_SOLUTION | Freq: Four times a day (QID) | RESPIRATORY_TRACT | Status: DC | PRN

## 2016-10-10 MED ORDER — SODIUM CHLORIDE 0.9 % IV SOLN
INTRAVENOUS | Status: DC
  Administered 2016-10-10: 03:00:00 via INTRAVENOUS

## 2016-10-10 MED ORDER — ACETAMINOPHEN 650 MG RE SUPP: 650.0000 mg | Freq: Four times a day (QID) | RECTAL | Status: DC | PRN

## 2016-10-10 NOTE — Progress Notes (Signed)
Pharmacy Antibiotic Note  [name] is a 51 y.o. male admitted on 10/09/2016 with UTI.  Pharmacy has been consulted for ceftriaxone dosing.  Plan: Ceftriaxone 1 gm IV Q24H  Height: 4\' 11"  (149.9 cm) Weight: 90 lb (40.8 kg) IBW/kg (Calculated) : 47.7  Temp (24hrs), Avg:98 F (36.7 C), Min:97.9 F (36.6 C), Max:98.1 F (36.7 C)   Recent Labs Lab 10/09/16 1953  WBC 18.3*  CREATININE 0.95    Estimated Creatinine Clearance: 54.3 mL/min (by C-G formula based on SCr of 0.95 mg/dL).    Allergies  Allergen Reactions  . Ivp Dye [Iodinated Diagnostic Agents] Hives  . Morphine And Related Nausea And Vomiting  . Sulfa Antibiotics Other (See Comments)    Reaction: unknown  . Latex Rash    Thank you for allowing pharmacy to be a part of this patient's care.  Laural Benes, Pharm.D., BCPS Clinical Pharmacist 10/10/2016 2:10 AM

## 2016-10-10 NOTE — Consult Note (Signed)
Date of Consultation:  10/10/2016  Requesting Physician:  Harvie Bridge, DO  Reason for Consultation:  Fecal impaction  History of Present Illness: [name] is a 51 y.o. male with a history of chronic constipation and prior episodes of fecal impaction, was admitted on 1/6 with diarrhea and abdominal pain.  On CT scan was noted to have fecal impaction in the rectum, with rectum measuring 12 cm in diameter.  Patient reports he was having crampy abdominal pain and distention with some nausea.  In the ED he received an enema which improved his pain and distention.  He had a second enema overnight which has resulted in two bowel movements with improved distention and improved pain.  He denies any further nausea.    Overall, he denies having any fevers, chills, chest pain, shortness of breath, or vomiting.  As part of workup, was noted to have WBC of 18.3, with normal lactic acid x 2 checks, and a U/A with large LE.  C diff was tested negative.  Past Medical History: Past Medical History:  Diagnosis Date  . Decubitus ulcer   . Renal disorder   . Spina bifida [facility])      Past Surgical History: Past Surgical History:  Procedure Laterality Date  . ABDOMINAL SURGERY    . BACK SURGERY    . COLON SURGERY    . ESOPHAGOGASTRODUODENOSCOPY (EGD) WITH PROPOFOL N/A 06/03/2016   Procedure: ESOPHAGOGASTRODUODENOSCOPY (EGD) WITH PROPOFOL;  Surgeon: [name], MD;  Location: [facility] ENDOSCOPY;  Service: Endoscopy;  Laterality: N/A;  . ilieostomy    . VENTRICULOPERITONEAL SHUNT    . vp shunt removal      Home Medications: Prior to Admission medications   Medication Sig Start Date End Date Taking? Authorizing Provider  feeding supplement, ENSURE ENLIVE, (ENSURE ENLIVE) LIQD Take 237 mLs by mouth 2 (two) times daily between meals. 04/26/16   Fritzi Mandes, MD    Allergies: Allergies  Allergen Reactions  . Ivp Dye [Iodinated Diagnostic Agents] Hives  . Morphine And Related Nausea And Vomiting   . Sulfa Antibiotics Other (See Comments)    Reaction: unknown  . Latex Rash    Social History:  reports that he has never smoked. He has never used smokeless tobacco. He reports that he does not drink alcohol or use drugs.   Family History: Family History  Problem Relation Age of Onset  . Diabetes Mellitus II Father     Review of Systems: Review of Systems  Constitutional: Negative for chills and fever.  HENT: Negative for hearing loss.   Eyes: Negative for blurred vision.  Respiratory: Negative for cough and shortness of breath.   Cardiovascular: Negative for chest pain and leg swelling.  Gastrointestinal: Positive for abdominal pain, constipation, diarrhea and nausea. Negative for vomiting.  Genitourinary: Negative for dysuria.  Musculoskeletal: Negative for myalgias.  Skin: Negative for rash.  Neurological: Negative for dizziness.  Psychiatric/Behavioral: Negative for depression.    Physical Exam BP 116/81 (BP Location: Left Arm)   Pulse (!) 106   Temp 97.6 F (36.4 C) (Oral)   Resp 15   Ht 4\' 11"  (1.499 m)   Wt 42 kg (92 lb 9.6 oz)   SpO2 99%   BMI 18.70 kg/m   Limited physical exam as patient was angry and frustrated that I was asking the "same questions as everyone else." CONSTITUTIONAL: No acute distress HEENT:  Normocephalic, atraumatic, extraocular motion intact. NECK: Trachea is midline, and there is no jugular venous distension. GI: The abdomen is  soft, nondistended, with mild discomfort on palpation. There were no palpable masses. Has diaper in place with liquid stool inside.  Laboratory Analysis: Results for orders placed or performed during the hospital encounter of 10/09/16 (from the past 24 hour(s))  Lipase, blood     Status: None   Collection Time: 10/09/16  7:53 PM  Result Value Ref Range   Lipase 23 11 - 51 U/L  Comprehensive metabolic panel     Status: Abnormal   Collection Time: 10/09/16  7:53 PM  Result Value Ref Range   Sodium 136 135 -  145 mmol/L   Potassium 3.2 (L) 3.5 - 5.1 mmol/L   Chloride 104 101 - 111 mmol/L   CO2 20 (L) 22 - 32 mmol/L   Glucose, Bld 87 65 - 99 mg/dL   BUN 17 6 - 20 mg/dL   Creatinine, Ser 0.95 0.61 - 1.24 mg/dL   Calcium 9.4 8.9 - 10.3 mg/dL   Total Protein 8.3 (H) 6.5 - 8.1 g/dL   Albumin 4.0 3.5 - 5.0 g/dL   AST 22 15 - 41 U/L   ALT 12 (L) 17 - 63 U/L   Alkaline Phosphatase 102 38 - 126 U/L   Total Bilirubin 0.8 0.3 - 1.2 mg/dL   GFR calc non Af Amer >60 >60 mL/min   GFR calc Af Amer >60 >60 mL/min   Anion gap 12 5 - 15  CBC     Status: Abnormal   Collection Time: 10/09/16  7:53 PM  Result Value Ref Range   WBC 18.3 (H) 3.8 - 10.6 K/uL   RBC 5.12 4.40 - 5.90 MIL/uL   Hemoglobin 14.0 13.0 - 18.0 g/dL   HCT 43.6 40.0 - 52.0 %   MCV 85.1 80.0 - 100.0 fL   MCH 27.3 26.0 - 34.0 pg   MCHC 32.1 32.0 - 36.0 g/dL   RDW 14.8 (H) 11.5 - 14.5 %   Platelets 428 150 - 440 K/uL  Urinalysis, Complete w Microscopic     Status: Abnormal   Collection Time: 10/09/16  7:53 PM  Result Value Ref Range   Color, Urine YELLOW (A) YELLOW   APPearance TURBID (A) CLEAR   Specific Gravity, Urine 1.008 1.005 - 1.030   pH 8.0 5.0 - 8.0   Glucose, UA NEGATIVE NEGATIVE mg/dL   Hgb urine dipstick SMALL (A) NEGATIVE   Bilirubin Urine NEGATIVE NEGATIVE   Ketones, ur 5 (A) NEGATIVE mg/dL   Protein, ur 100 (A) NEGATIVE mg/dL   Nitrite NEGATIVE NEGATIVE   Leukocytes, UA LARGE (A) NEGATIVE   RBC / HPF 0-5 0 - 5 RBC/hpf   WBC, UA 0-5 0 - 5 WBC/hpf   Bacteria, UA NONE SEEN NONE SEEN   Squamous Epithelial / LPF NONE SEEN NONE SEEN  Lactic acid, plasma     Status: None   Collection Time: 10/10/16  1:45 AM  Result Value Ref Range   Lactic Acid, Venous 0.6 0.5 - 1.9 mmol/L  C difficile quick scan w PCR reflex     Status: None   Collection Time: 10/10/16  6:15 AM  Result Value Ref Range   C Diff antigen NEGATIVE NEGATIVE   C Diff toxin NEGATIVE NEGATIVE   C Diff interpretation No C. difficile detected.    Gastrointestinal Panel by PCR , Stool     Status: None   Collection Time: 10/10/16  6:15 AM  Result Value Ref Range   Campylobacter species NOT DETECTED NOT DETECTED   Plesimonas shigelloides NOT  DETECTED NOT DETECTED   Salmonella species NOT DETECTED NOT DETECTED   Yersinia enterocolitica NOT DETECTED NOT DETECTED   Vibrio species NOT DETECTED NOT DETECTED   Vibrio cholerae NOT DETECTED NOT DETECTED   Enteroaggregative E coli (EAEC) NOT DETECTED NOT DETECTED   Enteropathogenic E coli (EPEC) NOT DETECTED NOT DETECTED   Enterotoxigenic E coli (ETEC) NOT DETECTED NOT DETECTED   Shiga like toxin producing E coli (STEC) NOT DETECTED NOT DETECTED   Shigella/Enteroinvasive E coli (EIEC) NOT DETECTED NOT DETECTED   Cryptosporidium NOT DETECTED NOT DETECTED   Cyclospora cayetanensis NOT DETECTED NOT DETECTED   Entamoeba histolytica NOT DETECTED NOT DETECTED   Giardia lamblia NOT DETECTED NOT DETECTED   Adenovirus F40/41 NOT DETECTED NOT DETECTED   Astrovirus NOT DETECTED NOT DETECTED   Norovirus GI/GII NOT DETECTED NOT DETECTED   Rotavirus A NOT DETECTED NOT DETECTED   Sapovirus (I, II, IV, and V) NOT DETECTED NOT DETECTED  Lactic acid, plasma     Status: None   Collection Time: 10/10/16  6:24 AM  Result Value Ref Range   Lactic Acid, Venous 0.9 0.5 - 1.9 mmol/L    Imaging: Ct Abdomen Pelvis Wo Contrast  Result Date: 10/09/2016 CLINICAL DATA:  Generalized abdominal pain and diarrhea EXAM: CT ABDOMEN AND PELVIS WITHOUT CONTRAST TECHNIQUE: Multidetector CT imaging of the abdomen and pelvis was performed following the standard protocol without IV contrast. COMPARISON:  06/06/2016, CT 06/02/2016, 04/20/2016 FINDINGS: Lower chest: Lung bases demonstrate no acute consolidation or significant pleural effusion. Heart size nonenlarged. Contrast is present within slightly enlarged distal esophagus, which may relate to a small hiatal hernia. Hepatobiliary: No calcified gallstones. No biliary  dilatation. No focal hepatic abnormality Pancreas: Unremarkable. No pancreatic ductal dilatation or surrounding inflammatory changes. Spleen: Normal in size without focal abnormality. Adrenals/Urinary Tract: Adrenal glands within normal limits. Scarring within both kidneys. Moderate chronic bilateral hydronephrosis with multiple intrarenal calculi some of which are layering. A small calculus is seen within the midportion of the urostomy were there is mild saccular dilatation of the ureters. There are small calcifications within the dilated ureters. Stomach/Bowel: No enlarged small bowel or small bowel thickening is visualized. Massive distension of the rectum with transverse diameter of 12 cm. Massive fecal impaction at the rectum with large formed stools present elsewhere within the air filled dilated colon. Vascular/Lymphatic: Non aneurysmal aorta. No grossly enlarged lymph nodes. Reproductive: No masses. Other: Deep sacral ulceration to the posterior perianal region. No free air. No free fluid. 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 Chronic bilateral hip effusions. IMPRESSION: 1. Massively enlarged rectum with large fecal impaction present. Large feces visualized elsewhere in the colon, the colon is moderate to markedly dilated. There is no evidence for small bowel obstruction 2. Bilateral hydronephrosis with chronic bilateral calcifications, status post right lower quadrant urostomy. A 6 mm stone or calcification is now visualized within the mid segment of the urostomy. There are additional punctate calcifications or stones along the course of the dilated proximal ureters. 3. Deep sacral ulceration to the posterior perianal region. 4. Spinal dysraphism as previously described. Left acetabular and femoral dysplasia with chronically dislocated left hip and chronic  bilateral hip effusions Electronically Signed   By: Donavan Foil M.D.   On: 10/09/2016 23:09    Assessment and Plan: This is a 51 y.o. male who presents with prior history of fecal impaction and UTI  --No  surgical intervention is needed for fecal impaction and there is no evidence of perforation and his physical exam is not concerning for peritonitis.   --Currently enemas are working and the patient feels better compared to last night. Recommend aggressive bowel regimen and may have Magnesium citrate or lactulose po as well as soap suds enemas. --Please feel free to call or page if any questions or concerns.   Melvyn Neth, [place]

## 2016-10-10 NOTE — ED Notes (Signed)
Soaps suds enema administed pt with liquid stool, large amount. There is a large fist sized palpable hard stool noted in rectum, but this rn is not able to remove digitally and pt is not able to pass. Dr. Jacqualine Code notified. Pt with less distension noted after large passage of liquid stool and large amount of gas with enema.

## 2016-10-10 NOTE — Progress Notes (Signed)
[facility] [place] NAME: [name]    MR#:  [id number]  DATE OF BIRTH:  Jan 05, 1966  SUBJECTIVE:  CHIEF COMPLAINT:   Chief Complaint  Patient presents with  . Diarrhea  . Abdominal Pain  The patient is 51 year old Caucasian male with past medical history significant for history of spina bifida, gastric causes of dissection, chronic constipation, who presented to the hospital with complaints of abdominal distention, abdominal pain, diarrhea for the past 2 days. On arrival to the hospital. He had CT scan of his abdomen and pelvis, revealing fecal impaction in rectum. The patient was admitted. He feels better now with less abdominal distention, he is able to pass gas. He has bowel incontinence liquidy stool.   Review of Systems  Gastrointestinal: Positive for abdominal pain, constipation and diarrhea.    VITAL SIGNS: Blood pressure 118/61, pulse (!) 101, temperature 97.5 F (36.4 C), temperature source Oral, resp. rate 12, height 4\' 11"  (1.499 m), weight 42 kg (92 lb 9.6 oz), SpO2 100 %.  PHYSICAL EXAMINATION:   GENERAL:  51 y.o.-year-old patient lying in the bed with no acute distress.  EYES: Pupils equal, round, reactive to light and accommodation. No scleral icterus. Extraocular muscles intact.  HEENT: Head atraumatic, normocephalic. Oropharynx and nasopharynx clear.  NECK:  Supple, no jugular venous distention. No thyroid enlargement, no tenderness.  LUNGS: Normal breath sounds bilaterally, no wheezing, rales,rhonchi or crepitation. No use of accessory muscles of respiration.  CARDIOVASCULAR: S1, S2 normal. No murmurs, rubs, or gallops.  ABDOMEN: Soft, nontender, nondistended. Bowel sounds present, diminished. No organomegaly or mass.  EXTREMITIES: No pedal edema, cyanosis, or clubbing.  NEUROLOGIC: Cranial nerves II through XII are intact. Muscle strength 5/5 in all extremities. Sensation intact. Gait not checked.   PSYCHIATRIC: The patient is alert and oriented x 3.  SKIN: No obvious rash, lesion, or ulcer.   ORDERS/RESULTS REVIEWED:   CBC  Recent Labs Lab 10/09/16 1953  WBC 18.3*  HGB 14.0  HCT 43.6  PLT 428  MCV 85.1  MCH 27.3  MCHC 32.1  RDW 14.8*   ------------------------------------------------------------------------------------------------------------------  Chemistries   Recent Labs Lab 10/09/16 1953  NA 136  K 3.2*  CL 104  CO2 20*  GLUCOSE 87  BUN 17  CREATININE 0.95  CALCIUM 9.4  AST 22  ALT 12*  ALKPHOS 102  BILITOT 0.8   ------------------------------------------------------------------------------------------------------------------ estimated creatinine clearance is 55.9 mL/min (by C-G formula based on SCr of 0.95 mg/dL). ------------------------------------------------------------------------------------------------------------------ No results for input(s): TSH, T4TOTAL, T3FREE, THYROIDAB in the last 72 hours.  Invalid input(s): FREET3  Cardiac Enzymes No results for input(s): CKMB, TROPONINI, MYOGLOBIN in the last 168 hours.  Invalid input(s): CK ------------------------------------------------------------------------------------------------------------------ Invalid input(s): POCBNP ---------------------------------------------------------------------------------------------------------------  RADIOLOGY: Ct Abdomen Pelvis Wo Contrast  Result Date: 10/09/2016 CLINICAL DATA:  Generalized abdominal pain and diarrhea EXAM: CT ABDOMEN AND PELVIS WITHOUT CONTRAST TECHNIQUE: Multidetector CT imaging of the abdomen and pelvis was performed following the standard protocol without IV contrast. COMPARISON:  06/06/2016, CT 06/02/2016, 04/20/2016 FINDINGS: Lower chest: Lung bases demonstrate no acute consolidation or significant pleural effusion. Heart size nonenlarged. Contrast is present within slightly enlarged distal esophagus, which may relate to a small  hiatal hernia. Hepatobiliary: No calcified gallstones. No biliary dilatation. No focal hepatic abnormality Pancreas: Unremarkable. No pancreatic ductal dilatation or surrounding inflammatory changes. Spleen: Normal in size without focal abnormality. Adrenals/Urinary Tract: Adrenal glands within normal limits. Scarring within both kidneys. Moderate chronic bilateral hydronephrosis with multiple intrarenal  calculi some of which are layering. A small calculus is seen within the midportion of the urostomy were there is mild saccular dilatation of the ureters. There are small calcifications within the dilated ureters. Stomach/Bowel: No enlarged small bowel or small bowel thickening is visualized. Massive distension of the rectum with transverse diameter of 12 cm. Massive fecal impaction at the rectum with large formed stools present elsewhere within the air filled dilated colon. Vascular/Lymphatic: Non aneurysmal aorta. No grossly enlarged lymph nodes. Reproductive: No masses. Other: Deep sacral ulceration to the posterior perianal region. No free air. No free fluid. 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 Chronic bilateral hip effusions. IMPRESSION: 1. Massively enlarged rectum with large fecal impaction present. Large feces visualized elsewhere in the colon, the colon is moderate to markedly dilated. There is no evidence for small bowel obstruction 2. Bilateral hydronephrosis with chronic bilateral calcifications, status post right lower quadrant urostomy. A 6 mm stone or calcification is now visualized within the mid segment of the urostomy. There are additional punctate calcifications or stones along the course of the dilated proximal ureters. 3. Deep sacral ulceration to the posterior perianal region. 4. Spinal dysraphism as previously described. Left acetabular and femoral  dysplasia with chronically dislocated left hip and chronic bilateral hip effusions Electronically Signed   By: Donavan Foil M.D.   On: 10/09/2016 23:09    EKG:  Orders placed or performed during the hospital encounter of 06/02/16  . ED EKG  . EKG 12-Lead  . EKG 12-Lead  . ED EKG    ASSESSMENT AND PLAN:  Active Problems:   Fecal impaction in rectum ([place])  #1. Fecal impaction in the rectum, continue patient on enemas and MiraLAX, watching stool output, a grade diet to regular #2. Abdominal distention, likely due to pseudoobstruction, continue bowel regimen #3. Hypokalemia, supplement orally, get magnesium level checked #4. Leukocytosis, follow in the morning, no obvious infection noted, urinalysis was unremarkable #5. Diarrhea, C. difficile negative, gastrointestinal panel is negative, likely due to MiraLAX  Management plans discussed with the patient, family and they are in agreement.   DRUG ALLERGIES:  Allergies  Allergen Reactions  . Ivp Dye [Iodinated Diagnostic Agents] Hives  . Morphine And Related Nausea And Vomiting  . Sulfa Antibiotics Other (See Comments)    Reaction: unknown  . Latex Rash    CODE STATUS:     Code Status Orders        Start     Ordered   10/10/16 0206  Full code  Continuous     10/10/16 0205    Code Status History    Date Active Date Inactive Code Status Order ID Comments User Context   06/03/2016  4:29 AM 06/08/2016  7:14 PM Full Code [id number]  Saundra Shelling, MD Inpatient   04/21/2016  2:38 AM 04/26/2016  6:15 PM Full Code [id number]  Harrie Foreman, MD Inpatient    Advance Directive Documentation   Flowsheet Row Most Recent Value  Type of Advance Directive  Healthcare Power of Attorney  Pre-existing out of facility DNR order (yellow form or pink MOST form)  No data  "MOST" Form in Place?  No data      TOTAL TIME TAKING CARE OF THIS PATIENT: 40 minutes.    Theodoro Grist M.D on 10/10/2016 at 1:58 PM  Between 7am to 6pm - Pager -  [phone]  After 6pm go to [email or web address] -  password [email or web address] [facility]  [place]  Office  [phone]  CC: Primary care physician; Norva Riffle, MD

## 2016-10-10 NOTE — ED Notes (Signed)
Lab notified regarding need for venipuncture assist with lactic acid. Nevin Bloodgood in lab states will send someone.

## 2016-10-10 NOTE — H&P (Signed)
History and Physical   [facility] - [place] @ [facility] Admission History and Physical [employer], D.O.    Patient Name: [name] MR#: [id number] Date of Birth: [birth year] Date of Admission: 10/09/2016  Referring MD/NP/PA: Dr. Jacqualine Code Primary Care Physician: Norva Riffle, MD  Patient coming from: Home  Chief Complaint: Diarrhea, abdominal pain  HPI: [name] is a 51 y.o. male with a known history of spina bifida, gastric outlet obstruction,  presents to the emergency department for evaluation of diarrhea, abdominal pain.  Patient was in a usual state of health until the last two days when he began experiencing watery stool, multiple episodes of incontinence following an enema which is part of his routine. He is unable to pass bowel movements without enemas 2/2 spina bifida.  He reports associated crampy abdominal pain and distention.  Following enema in the ED his abdominal pain and distention are improved.  He is passing gas.   Of note he was admitted here in August 2016 with fecal impaction, UTI.  He has recently been on Cipro for a UTI and usually follows with [facility].     Otherwise there has been no change in status. Patient has been taking medication as prescribed and there has been no recent change in medication or diet.  There has been no recent illness, travel or sick contacts.    Patient denies fevers/chills, weakness, dizziness, chest pain, shortness of breath, N/V/C/D, dysuria/frequency, changes in mental status.   ED Course: Patient received soap suds enema.   Review of Systems:  CONSTITUTIONAL: No fever/chills, fatigue, weakness, weight gain/loss, headache. EYES: No blurry or double vision. ENT: No tinnitus, postnasal drip, redness or soreness of the oropharynx. RESPIRATORY: No cough, dyspnea, wheeze, hemoptysis.  CARDIOVASCULAR: No chest pain, palpitations, syncope, orthopnea,  GASTROINTESTINAL: No nausea, vomiting, Positive abdominal  pain, constipation, diarrhea.  No hematemesis, melena or hematochezia. GENITOURINARY: No dysuria, frequency, hematuria. ENDOCRINE: No polyuria or nocturia. No heat or cold intolerance. HEMATOLOGY: No anemia, bruising, bleeding. INTEGUMENTARY: No rashes, ulcers, lesions. MUSCULOSKELETAL: No arthritis, gout, dyspnea.  NEUROLOGIC: No numbness, tingling, ataxia, seizure-type activity, weakness. PSYCHIATRIC: No anxiety, depression, insomnia.   Past Medical History:  Diagnosis Date  . Decubitus ulcer   . Renal disorder   . Spina bifida [facility])     Past Surgical History:  Procedure Laterality Date  . ABDOMINAL SURGERY    . BACK SURGERY    . COLON SURGERY    . ESOPHAGOGASTRODUODENOSCOPY (EGD) WITH PROPOFOL N/A 06/03/2016   Procedure: ESOPHAGOGASTRODUODENOSCOPY (EGD) WITH PROPOFOL;  Surgeon: [name], MD;  Location: [facility] ENDOSCOPY;  Service: Endoscopy;  Laterality: N/A;  . ilieostomy    . VENTRICULOPERITONEAL SHUNT    . vp shunt removal       reports that he has never smoked. He has never used smokeless tobacco. He reports that he does not drink alcohol or use drugs.  Allergies  Allergen Reactions  . Ivp Dye [Iodinated Diagnostic Agents] Hives  . Morphine And Related Nausea And Vomiting  . Sulfa Antibiotics Other (See Comments)    Reaction: unknown  . Latex Rash    Family History  Problem Relation Age of Onset  . Diabetes Mellitus II Father    Family history has been reviewed and confirmed with patient.   Prior to Admission medications   Medication Sig Start Date End Date Taking? Authorizing Provider  feeding supplement, ENSURE ENLIVE, (ENSURE ENLIVE) LIQD Take 237 mLs by mouth 2 (two) times daily between meals. 04/26/16   Sona  Posey Pronto, MD    Physical Exam: Vitals:   10/09/16 2300 10/09/16 2330 10/10/16 0000 10/10/16 0100  BP: 98/79 118/81 103/74 102/76  Pulse: 97 92 93   Resp: 12 13 13 12   Temp:      TempSrc:      SpO2: 94% 94% 92%   Weight:      Height:         GENERAL: 51 y.o.-year-old male patient, well-developed, well-nourished lying in the bed in no acute distress.  Pleasant and cooperative. Generalized weakness.  HEENT: Head atraumatic, normocephalic. Pupils equal, round, reactive to light and accommodation. No scleral icterus. Extraocular muscles intact. Nares are patent. Oropharynx is clear. Mucus membranes moist. NECK: Supple, full range of motion. No JVD, no bruit heard. No thyroid enlargement, no tenderness, no cervical lymphadenopathy. CHEST: Normal breath sounds bilaterally. No wheezing, rales, rhonchi or crackles. No use of accessory muscles of respiration.  No reproducible chest wall tenderness.  CARDIOVASCULAR: S1, S2 normal. No murmurs, rubs, or gallops. Cap refill <2 seconds. Pulses intact distally.  ABDOMEN: Soft, distended, mildly tender throughout. No rebound, guarding, rigidity. Normoactive bowel sounds present in all four quadrants. No organomegaly or mass. Urostomy tube in place draining cloudy urine.   EXTREMITIES: No pedal edema, cyanosis, or clubbing. Atrophic lower extremities with diminished strength.   NEUROLOGIC: Cranial nerves II through XII are grossly intact with no focal sensorimotor deficit. Muscle strength 5/5 in all extremities. Sensation intact. Gait not checked. PSYCHIATRIC: The patient is alert and oriented x 3. Normal affect, mood, thought content. SKIN: Warm, dry, and intact without obvious rash, lesion, or ulcer.   Labs on Admission: I have personally reviewed following labs and imaging studies  CBC:  Recent Labs Lab 10/09/16 1953  WBC 18.3*  HGB 14.0  HCT 43.6  MCV 85.1  PLT 123456   Basic Metabolic Panel:  Recent Labs Lab 10/09/16 1953  NA 136  K 3.2*  CL 104  CO2 20*  GLUCOSE 87  BUN 17  CREATININE 0.95  CALCIUM 9.4   GFR: Estimated Creatinine Clearance: 54.3 mL/min (by C-G formula based on SCr of 0.95 mg/dL). Liver Function Tests:  Recent Labs Lab 10/09/16 1953  AST 22  ALT 12*   ALKPHOS 102  BILITOT 0.8  PROT 8.3*  ALBUMIN 4.0    Recent Labs Lab 10/09/16 1953  LIPASE 23   No results for input(s): AMMONIA in the last 168 hours. Coagulation Profile: No results for input(s): INR, PROTIME in the last 168 hours. Cardiac Enzymes: No results for input(s): CKTOTAL, CKMB, CKMBINDEX, TROPONINI in the last 168 hours. BNP (last 3 results) No results for input(s): PROBNP in the last 8760 hours. HbA1C: No results for input(s): HGBA1C in the last 72 hours. CBG: No results for input(s): GLUCAP in the last 168 hours. Lipid Profile: No results for input(s): CHOL, HDL, LDLCALC, TRIG, CHOLHDL, LDLDIRECT in the last 72 hours. Thyroid Function Tests: No results for input(s): TSH, T4TOTAL, FREET4, T3FREE, THYROIDAB in the last 72 hours. Anemia Panel: No results for input(s): VITAMINB12, FOLATE, FERRITIN, TIBC, IRON, RETICCTPCT in the last 72 hours. Urine analysis:    Component Value Date/Time   COLORURINE YELLOW (A) 10/09/2016 1953   APPEARANCEUR TURBID (A) 10/09/2016 1953   LABSPEC 1.008 10/09/2016 1953   PHURINE 8.0 10/09/2016 1953   GLUCOSEU NEGATIVE 10/09/2016 1953   HGBUR SMALL (A) 10/09/2016 1953   BILIRUBINUR NEGATIVE 10/09/2016 1953   KETONESUR 5 (A) 10/09/2016 1953   PROTEINUR 100 (A) 10/09/2016 1953  NITRITE NEGATIVE 10/09/2016 1953   LEUKOCYTESUR LARGE (A) 10/09/2016 1953   Sepsis Labs: @LABRCNTIP (procalcitonin:4,lacticidven:4) )No results found for this or any previous visit (from the past 240 hour(s)).   Radiological Exams on Admission: Ct Abdomen Pelvis Wo Contrast  Result Date: 10/09/2016 CLINICAL DATA:  Generalized abdominal pain and diarrhea EXAM: CT ABDOMEN AND PELVIS WITHOUT CONTRAST TECHNIQUE: Multidetector CT imaging of the abdomen and pelvis was performed following the standard protocol without IV contrast. COMPARISON:  06/06/2016, CT 06/02/2016, 04/20/2016 FINDINGS: Lower chest: Lung bases demonstrate no acute consolidation or significant  pleural effusion. Heart size nonenlarged. Contrast is present within slightly enlarged distal esophagus, which may relate to a small hiatal hernia. Hepatobiliary: No calcified gallstones. No biliary dilatation. No focal hepatic abnormality Pancreas: Unremarkable. No pancreatic ductal dilatation or surrounding inflammatory changes. Spleen: Normal in size without focal abnormality. Adrenals/Urinary Tract: Adrenal glands within normal limits. Scarring within both kidneys. Moderate chronic bilateral hydronephrosis with multiple intrarenal calculi some of which are layering. A small calculus is seen within the midportion of the urostomy were there is mild saccular dilatation of the ureters. There are small calcifications within the dilated ureters. Stomach/Bowel: No enlarged small bowel or small bowel thickening is visualized. Massive distension of the rectum with transverse diameter of 12 cm. Massive fecal impaction at the rectum with large formed stools present elsewhere within the air filled dilated colon. Vascular/Lymphatic: Non aneurysmal aorta. No grossly enlarged lymph nodes. Reproductive: No masses. Other: Deep sacral ulceration to the posterior perianal region. No free air. No free fluid. 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 Chronic bilateral hip effusions. IMPRESSION: 1. Massively enlarged rectum with large fecal impaction present. Large feces visualized elsewhere in the colon, the colon is moderate to markedly dilated. There is no evidence for small bowel obstruction 2. Bilateral hydronephrosis with chronic bilateral calcifications, status post right lower quadrant urostomy. A 6 mm stone or calcification is now visualized within the mid segment of the urostomy. There are additional punctate calcifications or stones along the course of the dilated proximal ureters.  3. Deep sacral ulceration to the posterior perianal region. 4. Spinal dysraphism as previously described. Left acetabular and femoral dysplasia with chronically dislocated left hip and chronic bilateral hip effusions Electronically Signed   By: Donavan Foil M.D.   On: 10/09/2016 23:09    Assessment/Plan Active Problems:   Fecal impaction [facility])    This is a 51 y.o. male with a history of spina bifida, gastric outlet obstruction,  now being admitted with: 1. Massive fecal impaction - Admit for IVFs, NPO - GI consult placed by EDP - recommends soap suds enemas and pain control - Surgical consult given high risk of obstruction.   2. Leukocytosis with evidence of UTI, new 73mm urostomy stone on CT, bilateral hydro.   - Not meetin SIRS or sepsis criteria - Will start Rocephin and follow up cultures - D/W Dr. Erlene Quan who will see patient in AM.   3. Hypokalemia, mild  - Replaced K by mouth in ED - Recheck BMP in AM.   Admission status: Inpatient IV Fluids: NS Diet/Nutrition: NPO Consults called: GI, Surgery, GU  DVT Px: Lovenox, SCDs and early ambulation Code Status: Full Code  Disposition Plan: To home in 2-3 days.    All the records are reviewed and case discussed with ED provider. Management plans discussed with the patient and/or family who express understanding and  agree with plan of care.  Josemiguel Gries D.O. on 10/10/2016 at 1:13 AM Between 7am to 6pm - Pager - [phone] After 6pm go to [email or web address] - Proofreader [facility] [place] [facility] Office [phone] CC: Primary care physician; Norva Riffle, MD Password [facility]  10/10/2016, 1:13 AM

## 2016-10-10 NOTE — ED Notes (Signed)
Patient transport to [address]

## 2016-10-10 NOTE — Consult Note (Signed)
Urology Consult  I have been asked to see the patient by Dr. Natale Milch, for evaluation and management of possible UTI, nephrolithiasis, leukocytosis.  Chief Complaint: Fecal impaction  History of Present Illness: [name] is a 51 y.o. year old male with a history of spina bifida, neurogenic bladder status post ileal conduit and recent conduit revision, nephrolithiasis who presented to the emergency room yesterday with abdominal distention, diarrhea found to have severe fecal impaction. He was admitted primarily for this purpose.  As part of the workup, he underwent a noncontrast CT scan which showed a massively enlarged rectum up to 12 cm with large fecal volume. In addition, he was found to have stable bilateral chronic hydronephrosis with bilateral nephrolithiasis. In addition, there is a 6 mm stone within the conduit itself and notable for punctate calcifications/stone which are nonobstructing along the dilated ureters.   He is also found to have a leukocytosis of 18. He is otherwise hemodynamically stable and afebrile. He denies any other symptoms including no flank pain, vomiting, fevers or chills. Other than his bowel issues, he is no complaints. His ostomy is draining well.  Urinalysis is consistent with known urostomy, nitrate negative, 0-5 white blood cells and red blood cells per high-powered field, no bacteria seen.    He does have a history of recurrent urinary tract infections and urosepsis. His last serious infection was in 2016 with Vanc resistant enterococcus only sensitive to [place].  He is followed by [facility] but has not been seen on over a year.  His last encounter was for urostomy stomal revision by Dr. Nigel Sloop.  He has has had several PCNLs in the past both at [facility].  He is overdue for follow up which he states was related to social issues.      Past Medical History:  Diagnosis Date  . Decubitus ulcer   . Renal disorder   . Spina bifida  [facility])     Past Surgical History:  Procedure Laterality Date  . ABDOMINAL SURGERY    . BACK SURGERY    . COLON SURGERY    . ESOPHAGOGASTRODUODENOSCOPY (EGD) WITH PROPOFOL N/A 06/03/2016   Procedure: ESOPHAGOGASTRODUODENOSCOPY (EGD) WITH PROPOFOL;  Surgeon: [name], MD;  Location: [facility] ENDOSCOPY;  Service: Endoscopy;  Laterality: N/A;  . ilieostomy    . VENTRICULOPERITONEAL SHUNT    . vp shunt removal      Home Medications:  No outpatient prescriptions have been marked as taking for the 10/09/16 encounter [facility] Encounter).    Allergies:  Allergies  Allergen Reactions  . Ivp Dye [Iodinated Diagnostic Agents] Hives  . Morphine And Related Nausea And Vomiting  . Sulfa Antibiotics Other (See Comments)    Reaction: unknown  . Latex Rash    Family History  Problem Relation Age of Onset  . Diabetes Mellitus II Father     Social History:  reports that he has never smoked. He has never used smokeless tobacco. He reports that he does not drink alcohol or use drugs.  ROS: A complete review of systems was performed.  All systems are negative except for pertinent findings as noted.  Physical Exam:  Vital signs in last 24 hours: Temp:  [97.7 F (36.5 C)-98.1 F (36.7 C)] 97.7 F (36.5 C) (01/07 0552) Pulse Rate:  [53-109] 53 (01/07 0552) Resp:  [10-20] 17 (01/07 0552) BP: (98-132)/(64-96) 104/72 (01/07 0552) SpO2:  [90 %-99 %] 95 % (01/07 0552) Weight:  [90 lb (40.8 kg)-92 lb 9.6  oz (42 kg)] 92 lb 9.6 oz (42 kg) (01/07 0419) Constitutional:  Alert and oriented, No acute distress HEENT: [place] AT, moist mucus membranes.  Trachea midline, no masses Cardiovascular: Regular rate and rhythm, no clubbing, cyanosis, or edema. Respiratory: Normal respiratory effort, lungs clear bilaterally GI: Abdomen is soft, nontender, nondistended, no abdominal masses.  Urostomy in place in right lower quadrant, draining clear yellow urine with some mucus. GU: No CVA tenderness.  Skin: No rashes,  bruises or suspicious lesions Neurologic/MSK: Grossly intact. Lower extremity dysplasia and atrophy appreciated. Psychiatric: Normal mood and affect   Laboratory Data:   Recent Labs  10/09/16 1953  WBC 18.3*  HGB 14.0  HCT 43.6    Recent Labs  10/09/16 1953  NA 136  K 3.2*  CL 104  CO2 20*  GLUCOSE 87  BUN 17  CREATININE 0.95  CALCIUM 9.4   No results for input(s): LABPT, INR in the last 72 hours. No results for input(s): LABURIN in the last 72 hours. Results for orders placed or performed during the hospital encounter of 10/09/16  C difficile quick scan w PCR reflex     Status: None   Collection Time: 10/10/16  6:15 AM  Result Value Ref Range Status   C Diff antigen NEGATIVE NEGATIVE Final   C Diff toxin NEGATIVE NEGATIVE Final   C Diff interpretation No C. difficile detected.  Final     Radiologic Imaging: Ct Abdomen Pelvis Wo Contrast  Result Date: 10/09/2016 CLINICAL DATA:  Generalized abdominal pain and diarrhea EXAM: CT ABDOMEN AND PELVIS WITHOUT CONTRAST TECHNIQUE: Multidetector CT imaging of the abdomen and pelvis was performed following the standard protocol without IV contrast. COMPARISON:  06/06/2016, CT 06/02/2016, 04/20/2016 FINDINGS: Lower chest: Lung bases demonstrate no acute consolidation or significant pleural effusion. Heart size nonenlarged. Contrast is present within slightly enlarged distal esophagus, which may relate to a small hiatal hernia. Hepatobiliary: No calcified gallstones. No biliary dilatation. No focal hepatic abnormality Pancreas: Unremarkable. No pancreatic ductal dilatation or surrounding inflammatory changes. Spleen: Normal in size without focal abnormality. Adrenals/Urinary Tract: Adrenal glands within normal limits. Scarring within both kidneys. Moderate chronic bilateral hydronephrosis with multiple intrarenal calculi some of which are layering. A small calculus is seen within the midportion of the urostomy were there is mild saccular  dilatation of the ureters. There are small calcifications within the dilated ureters. Stomach/Bowel: No enlarged small bowel or small bowel thickening is visualized. Massive distension of the rectum with transverse diameter of 12 cm. Massive fecal impaction at the rectum with large formed stools present elsewhere within the air filled dilated colon. Vascular/Lymphatic: Non aneurysmal aorta. No grossly enlarged lymph nodes. Reproductive: No masses. Other: Deep sacral ulceration to the posterior perianal region. No free air. No free fluid. 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 Chronic bilateral hip effusions. IMPRESSION: 1. Massively enlarged rectum with large fecal impaction present. Large feces visualized elsewhere in the colon, the colon is moderate to markedly dilated. There is no evidence for small bowel obstruction 2. Bilateral hydronephrosis with chronic bilateral calcifications, status post right lower quadrant urostomy. A 6 mm stone or calcification is now visualized within the mid segment of the urostomy. There are additional punctate calcifications or stones along the course of the dilated proximal ureters. 3. Deep sacral ulceration to the posterior perianal region. 4. Spinal dysraphism as previously described. Left acetabular and femoral dysplasia with chronically  dislocated left hip and chronic bilateral hip effusions Electronically Signed   By: Donavan Foil M.D.   On: 10/09/2016 23:09   CT scan personally reviewed.  Impression/Assessment:  51 year old male with history of specimen test post ileal conduit, known history of bilateral nephrolithiasis who is admitted with severe fecal impaction.  On admission, he is noted to have leukocytosis (18) but otherwise no signs or symptoms of infection including no fevers, chills, otherwise hemodynamically  stable.  UA is fairly unremarkable, consistent with known ileal conduit.    CT scan with bilateral nephrolithiasis, overall fairly significant burden with some nonobstructing what appears to be calcium deposits along the wall of both dilated ureters which are nonobstructing. He does have a stone within his fairly decompressed conduit.  Plan:  1. Possible UTI-patient has an ileal conduit and a fairly unremarkable urinalysis, mostly consistent with his anatomy. Based on his overall clinical picture, I do not suspect urinary tract infection. - If he does decompensate, leukocytosis worsens, I would recommend broadening his antibiotics to cover think resistant enterococcus with lid nasally and given his history.  -F/u urine culture, suspect it will grow mixed flora consistent with chronic colonization   2. Nephrolithiasis-  large upper tract stone burden bilaterally and nonobstructing calcification within the ureter, nonobstructing 6 mm stone within the ileal conduit. Given the complexity of this patient and his established care with [facility], I would refer him back there for definitive management.    3. Chronic bilateral hydroureteronephrosis- secondary to refluxing anastomosis, previously evaluated with a loopogram confirming this.  Stable. Creatinine to baseline.  10/10/2016, 8:05 AM  Hollice Espy,  MD   Will sign off, please page with any questions or concerns

## 2016-10-11 ENCOUNTER — Inpatient Hospital Stay: Payer: Medicare Other

## 2016-10-11 DIAGNOSIS — E876 Hypokalemia: Secondary | ICD-10-CM

## 2016-10-11 DIAGNOSIS — K56609 Unspecified intestinal obstruction, unspecified as to partial versus complete obstruction: Secondary | ICD-10-CM

## 2016-10-11 DIAGNOSIS — D72829 Elevated white blood cell count, unspecified: Secondary | ICD-10-CM

## 2016-10-11 DIAGNOSIS — R195 Other fecal abnormalities: Secondary | ICD-10-CM

## 2016-10-11 DIAGNOSIS — R140 Abdominal distension (gaseous): Secondary | ICD-10-CM

## 2016-10-11 HISTORY — DX: Elevated white blood cell count, unspecified: D72.829

## 2016-10-11 HISTORY — DX: Unspecified intestinal obstruction, unspecified as to partial versus complete obstruction: K56.609

## 2016-10-11 HISTORY — DX: Hypokalemia: E87.6

## 2016-10-11 HISTORY — DX: Abdominal distension (gaseous): R14.0

## 2016-10-11 HISTORY — DX: Other fecal abnormalities: R19.5

## 2016-10-11 LAB — CBC
HEMATOCRIT: 35.5 % — AB (ref 40.0–52.0)
HEMOGLOBIN: 11.7 g/dL — AB (ref 13.0–18.0)
MCH: 28.4 pg (ref 26.0–34.0)
MCHC: 32.9 g/dL (ref 32.0–36.0)
MCV: 86.2 fL (ref 80.0–100.0)
Platelets: 299 10*3/uL (ref 150–440)
RBC: 4.12 MIL/uL — ABNORMAL LOW (ref 4.40–5.90)
RDW: 14.7 % — ABNORMAL HIGH (ref 11.5–14.5)
WBC: 5.4 10*3/uL (ref 3.8–10.6)

## 2016-10-11 LAB — BASIC METABOLIC PANEL
ANION GAP: 6 (ref 5–15)
BUN: 14 mg/dL (ref 6–20)
CO2: 19 mmol/L — AB (ref 22–32)
Calcium: 8.3 mg/dL — ABNORMAL LOW (ref 8.9–10.3)
Chloride: 115 mmol/L — ABNORMAL HIGH (ref 101–111)
Creatinine, Ser: 0.78 mg/dL (ref 0.61–1.24)
GFR calc Af Amer: >60 mL/min (ref >60)
GFR calc non Af Amer: >60 mL/min (ref >60)
GLUCOSE: 77 mg/dL (ref 65–99)
POTASSIUM: 3.9 mmol/L (ref 3.5–5.1)
Sodium: 140 mmol/L (ref 135–145)

## 2016-10-11 LAB — URINE CULTURE: Special Requests: NORMAL

## 2016-10-11 MED ORDER — POLYETHYLENE GLYCOL 3350 17 G PO PACK: 17.0000 g | PACK | Freq: Two times a day (BID) | ORAL | 12 refills | Status: DC

## 2016-10-11 NOTE — Progress Notes (Signed)
Pharmacy Antibiotic Note  [name] is a 51 y.o. male admitted on 10/09/2016 with UTI.  Pharmacy has been consulted for ceftriaxone dosing. WBC 5.4; afebrile; UA unremarkable; pt denied dysuria/frequency on admission Per urology, no UTI suspected  Plan: Continue ceftriaxone 1 gm IV Q24H for now. Will speak with MD about d/c antibiotics for UTI  Height: 4\' 11"  (149.9 cm) Weight: 92 lb 9.6 oz (42 kg) IBW/kg (Calculated) : 47.7  Temp (24hrs), Avg:97.9 F (36.6 C), Min:97.5 F (36.4 C), Max:98.8 F (37.1 C)   Recent Labs Lab 10/09/16 1953 10/10/16 0145 10/10/16 0624 10/11/16 0513  WBC 18.3*  --   --  5.4  CREATININE 0.95  --   --  0.78  LATICACIDVEN  --  0.6 0.9  --     Estimated Creatinine Clearance: 66.4 mL/min (by C-G formula based on SCr of 0.78 mg/dL).    Allergies  Allergen Reactions  . Ivp Dye [Iodinated Diagnostic Agents] Hives  . Morphine And Related Nausea And Vomiting  . Sulfa Antibiotics Other (See Comments)    Reaction: unknown  . Latex Rash   Cultures 1/6 UCx: multiple species present (likely chronic colonization)  Thank you for allowing pharmacy to be a part of this patient's care.  Darrow Bussing, PharmD Pharmacy Resident 10/11/2016 9:52 AM

## 2016-10-11 NOTE — Progress Notes (Signed)
Alert and oriented. Vital signs stable . No signs of acute distress. Discharge instructions given. Patient verbalized understanding. No other issues noted at this time.Tolerated diet. HAd 3 BMs since after  enema administration.

## 2016-10-11 NOTE — Plan of Care (Signed)
Problem: Bowel/Gastric: Goal: Will not experience complications related to bowel motility Outcome: Progressing Pt is continuing to have BMs. Pt reports less discomfort in his abdomen. Abdomen is less distended.

## 2016-10-11 NOTE — Discharge Summary (Signed)
[place] [facility] [place] NAME: [name]    MR#:  [id number]  DATE OF BIRTH:  Nov 17, 1965  DATE OF ADMISSION:  10/09/2016 ADMITTING PHYSICIAN: Ubaldo Glassing Hugelmeyer, DO  DATE OF DISCHARGE: No discharge date for patient encounter.  PRIMARY CARE PHYSICIAN: Norva Riffle, MD     ADMISSION DIAGNOSIS:  Fecal impaction in rectum (HCC) [K56.41] Leukocytosis, unspecified type [D72.829]  DISCHARGE DIAGNOSIS:  Active Problems:   Fecal impaction in rectum (HCC)   Abdominal distention   Distal intestinal obstruction syndrome   Hypokalemia   Leukocytosis   Loose stools   SECONDARY DIAGNOSIS:   Past Medical History:  Diagnosis Date  . Decubitus ulcer   . Renal disorder   . Spina bifida ([place])     .pro HOSPITAL COURSE:  The patient is 51 year old Caucasian male with past medical history significant for history of spina bifida, gastric Outlet obstruction, chronic constipation, who presented to the hospital with complaints of abdominal distention, abdominal pain, diarrhea for  2 days. On arrival to the hospital he had CT scan of his abdomen and pelvis, revealing fecal impaction in rectum and sigmoid colon, no small bowel obstruction, bilateral hydronephrosis with chronic bilateral calcifications, status post right lower quadrant urostomy. A 6 mm stone or calcification is now visualized within the mid segment of the urostomy. There are additional punctate calcifications or stones along the course of the dilated proximal ureters. The patient was admitted. The patient was initiated on the MiraLAX and enemas and had multiple stools. Repeated x-ray of abdomen revealed residual fecal impaction in the rectum and sigmoid colon, however, patient's proximal colon distention decreased. Patient had good oral intake and had continuous bowel movements, it was so that patient is stable to be discharged home on MiraLAX and intermittent enemas. Patient was seen  by surgery, recommended aggressive bowel regimen. The patient was also seen by urologist, who did not feel that patient had urinary tract infection. Patient's urine culture came back as multiple species, recollection was suggested. Patient's mother cell count normalized with antibiotic therapy. In regards to nephrolithiasis, urologist felt that the patient needs to go back to [facility] urology for further recommendations Discussion by problem: #1. Fecal impaction in the rectum, continue patient on enemas and MiraLAX at home, good stool output, patient tolerates regular diet with no nausea, vomiting, abdominal bloating, he stable to be discharged home #2. Abdominal distention due to distal partial colonic obstruction,  continue aggressive bowel regimen with enemas and MiraLAX #3. Hypokalemia, supplemented orally, normalized, magnesium level was normal #4. Leukocytosis, resolved, no obvious infection noted, urinalysis was unremarkable, urine culture revealed multiple bacterial organisms, suspected chronic colonization.  #5. Diarrhea, C. difficile negative, gastrointestinal panel is negative, likely due to medications taken at home  DISCHARGE CONDITIONS:   Treatment Team:  Hollice Espy, MD Lin Landsman, MD [name], MD  DRUG ALLERGIES:   Allergies  Allergen Reactions  . Ivp Dye [Iodinated Diagnostic Agents] Hives  . Morphine And Related Nausea And Vomiting  . Sulfa Antibiotics Other (See Comments)    Reaction: unknown  . Latex Rash    DISCHARGE MEDICATIONS:   Current Discharge Medication List    START taking these medications   Details  polyethylene glycol (MIRALAX / GLYCOLAX) packet Take 17 g by mouth 2 (two) times daily. Qty: 60 packet, Refills: 12      CONTINUE these medications which have NOT CHANGED   Details  feeding supplement, ENSURE ENLIVE, (ENSURE ENLIVE) LIQD Take 237  mLs by mouth 2 (two) times daily between meals. Qty: 237 mL, Refills: 12         DISCHARGE  INSTRUCTIONS:    The patient is to follow-up with primary care physician and [facility] urology as outpatient  If you experience worsening of your admission symptoms, develop shortness of breath, life threatening emergency, suicidal or homicidal thoughts you must seek medical attention immediately by calling 911 or calling your MD immediately  if symptoms less severe.  You Must read complete instructions/literature along with all the possible adverse reactions/side effects for all the Medicines you take and that have been prescribed to you. Take any new Medicines after you have completely understood and accept all the possible adverse reactions/side effects.   Please note  You were cared for by a hospitalist during your hospital stay. If you have any questions about your discharge medications or the care you received while you were in the hospital after you are discharged, you can call the unit and asked to speak with the hospitalist on call if the hospitalist that took care of you is not available. Once you are discharged, your primary care physician will handle any further medical issues. Please note that NO REFILLS for any discharge medications will be authorized once you are discharged, as it is imperative that you return to your primary care physician (or establish a relationship with a primary care physician if you do not have one) for your aftercare needs so that they can reassess your need for medications and monitor your lab values.    Today   CHIEF COMPLAINT:   Chief Complaint  Patient presents with  . Diarrhea  . Abdominal Pain    HISTORY OF PRESENT ILLNESS:  [name]  is a 51 y.o. male with a known history of spina bifida, gastric Outlet obstruction, chronic constipation, who presented to the hospital with complaints of abdominal distention, abdominal pain, diarrhea for  2 days. On arrival to the hospital he had CT scan of his abdomen and pelvis, revealing fecal impaction in  rectum and sigmoid colon, no small bowel obstruction, bilateral hydronephrosis with chronic bilateral calcifications, status post right lower quadrant urostomy. A 6 mm stone or calcification is now visualized within the mid segment of the urostomy. There are additional punctate calcifications or stones along the course of the dilated proximal ureters. The patient was admitted. The patient was initiated on the MiraLAX and enemas and had multiple stools. Repeated x-ray of abdomen revealed residual fecal impaction in the rectum and sigmoid colon, however, patient's proximal colon distention decreased. Patient had good oral intake and had continuous bowel movements, it was so that patient is stable to be discharged home on MiraLAX and intermittent enemas. Patient was seen by surgery, recommended aggressive bowel regimen. The patient was also seen by urologist, who did not feel that patient had urinary tract infection. Patient's urine culture came back as multiple species, recollection was suggested. Patient's mother cell count normalized with antibiotic therapy. In regards to nephrolithiasis, urologist felt that the patient needs to go back to [facility] urology for further recommendations Discussion by problem: #1. Fecal impaction in the rectum, continue patient on enemas and MiraLAX at home, good stool output, patient tolerates regular diet with no nausea, vomiting, abdominal bloating, he stable to be discharged home #2. Abdominal distention due to distal partial colonic obstruction,  continue aggressive bowel regimen with enemas and MiraLAX #3. Hypokalemia, supplemented orally, normalized, magnesium level was normal #4. Leukocytosis, resolved, no obvious infection noted,  urinalysis was unremarkable, urine culture revealed multiple bacterial organisms, suspected chronic colonization.  #5. Diarrhea, C. difficile negative, gastrointestinal panel is negative, likely due to medications taken at home   VITAL SIGNS:   Blood pressure 138/75, pulse 75, temperature 97.8 F (36.6 C), temperature source Oral, resp. rate 20, height 4\' 11"  (1.499 m), weight 42 kg (92 lb 9.6 oz), SpO2 100 %.  I/O:   Intake/Output Summary (Last 24 hours) at 10/11/16 1409 Last data filed at 10/11/16 1231  Gross per 24 hour  Intake          4444.39 ml  Output             1850 ml  Net          2594.39 ml    PHYSICAL EXAMINATION:  GENERAL:  51 y.o.-year-old patient lying in the bed with no acute distress.  EYES: Pupils equal, round, reactive to light and accommodation. No scleral icterus. Extraocular muscles intact.  HEENT: Head atraumatic, normocephalic. Oropharynx and nasopharynx clear.  NECK:  Supple, no jugular venous distention. No thyroid enlargement, no tenderness.  LUNGS: Normal breath sounds bilaterally, no wheezing, rales,rhonchi or crepitation. No use of accessory muscles of respiration.  CARDIOVASCULAR: S1, S2 normal. No murmurs, rubs, or gallops.  ABDOMEN: Soft, non-tender, non-distended. Bowel sounds present. No organomegaly or mass.  EXTREMITIES: No pedal edema, cyanosis, or clubbing.  NEUROLOGIC: Cranial nerves II through XII are intact. Muscle strength 5/5 in all extremities. Sensation intact. Gait not checked.  PSYCHIATRIC: The patient is alert and oriented x 3.  SKIN: No obvious rash, lesion, or ulcer.   DATA REVIEW:   CBC  Recent Labs Lab 10/11/16 0513  WBC 5.4  HGB 11.7*  HCT 35.5*  PLT 299    Chemistries   Recent Labs Lab 10/09/16 1953 10/11/16 0513  NA 136 140  K 3.2* 3.9  CL 104 115*  CO2 20* 19*  GLUCOSE 87 77  BUN 17 14  CREATININE 0.95 0.78  CALCIUM 9.4 8.3*  AST 22  --   ALT 12*  --   ALKPHOS 102  --   BILITOT 0.8  --     Cardiac Enzymes No results for input(s): TROPONINI in the last 168 hours.  Microbiology Results  Results for orders placed or performed during the hospital encounter of 10/09/16  Urine culture     Status: Abnormal   Collection Time: 10/09/16  7:53  PM  Result Value Ref Range Status   Specimen Description URINE, RANDOM  Final   Special Requests Normal  Final   Culture MULTIPLE SPECIES PRESENT, SUGGEST RECOLLECTION (A)  Final   Report Status 10/11/2016 FINAL  Final  C difficile quick scan w PCR reflex     Status: None   Collection Time: 10/10/16  6:15 AM  Result Value Ref Range Status   C Diff antigen NEGATIVE NEGATIVE Final   C Diff toxin NEGATIVE NEGATIVE Final   C Diff interpretation No C. difficile detected.  Final  Gastrointestinal Panel by PCR , Stool     Status: None   Collection Time: 10/10/16  6:15 AM  Result Value Ref Range Status   Campylobacter species NOT DETECTED NOT DETECTED Final   Plesimonas shigelloides NOT DETECTED NOT DETECTED Final   Salmonella species NOT DETECTED NOT DETECTED Final   Yersinia enterocolitica NOT DETECTED NOT DETECTED Final   Vibrio species NOT DETECTED NOT DETECTED Final   Vibrio cholerae NOT DETECTED NOT DETECTED Final   Enteroaggregative E coli (EAEC) NOT  DETECTED NOT DETECTED Final   Enteropathogenic E coli (EPEC) NOT DETECTED NOT DETECTED Final   Enterotoxigenic E coli (ETEC) NOT DETECTED NOT DETECTED Final   Shiga like toxin producing E coli (STEC) NOT DETECTED NOT DETECTED Final   Shigella/Enteroinvasive E coli (EIEC) NOT DETECTED NOT DETECTED Final   Cryptosporidium NOT DETECTED NOT DETECTED Final   Cyclospora cayetanensis NOT DETECTED NOT DETECTED Final   Entamoeba histolytica NOT DETECTED NOT DETECTED Final   Giardia lamblia NOT DETECTED NOT DETECTED Final   Adenovirus F40/41 NOT DETECTED NOT DETECTED Final   Astrovirus NOT DETECTED NOT DETECTED Final   Norovirus GI/GII NOT DETECTED NOT DETECTED Final   Rotavirus A NOT DETECTED NOT DETECTED Final   Sapovirus (I, II, IV, and V) NOT DETECTED NOT DETECTED Final    RADIOLOGY:  Ct Abdomen Pelvis Wo Contrast  Result Date: 10/09/2016 CLINICAL DATA:  Generalized abdominal pain and diarrhea EXAM: CT ABDOMEN AND PELVIS WITHOUT CONTRAST  TECHNIQUE: Multidetector CT imaging of the abdomen and pelvis was performed following the standard protocol without IV contrast. COMPARISON:  06/06/2016, CT 06/02/2016, 04/20/2016 FINDINGS: Lower chest: Lung bases demonstrate no acute consolidation or significant pleural effusion. Heart size nonenlarged. Contrast is present within slightly enlarged distal esophagus, which may relate to a small hiatal hernia. Hepatobiliary: No calcified gallstones. No biliary dilatation. No focal hepatic abnormality Pancreas: Unremarkable. No pancreatic ductal dilatation or surrounding inflammatory changes. Spleen: Normal in size without focal abnormality. Adrenals/Urinary Tract: Adrenal glands within normal limits. Scarring within both kidneys. Moderate chronic bilateral hydronephrosis with multiple intrarenal calculi some of which are layering. A small calculus is seen within the midportion of the urostomy were there is mild saccular dilatation of the ureters. There are small calcifications within the dilated ureters. Stomach/Bowel: No enlarged small bowel or small bowel thickening is visualized. Massive distension of the rectum with transverse diameter of 12 cm. Massive fecal impaction at the rectum with large formed stools present elsewhere within the air filled dilated colon. Vascular/Lymphatic: Non aneurysmal aorta. No grossly enlarged lymph nodes. Reproductive: No masses. Other: Deep sacral ulceration to the posterior perianal region. No free air. No free fluid. 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 Chronic bilateral hip effusions. IMPRESSION: 1. Massively enlarged rectum with large fecal impaction present. Large feces visualized elsewhere in the colon, the colon is moderate to markedly dilated. There is no evidence for small bowel obstruction 2. Bilateral hydronephrosis with chronic  bilateral calcifications, status post right lower quadrant urostomy. A 6 mm stone or calcification is now visualized within the mid segment of the urostomy. There are additional punctate calcifications or stones along the course of the dilated proximal ureters. 3. Deep sacral ulceration to the posterior perianal region. 4. Spinal dysraphism as previously described. Left acetabular and femoral dysplasia with chronically dislocated left hip and chronic bilateral hip effusions Electronically Signed   By: Donavan Foil M.D.   On: 10/09/2016 23:09   Dg Abd 1 View  Result Date: 10/11/2016 CLINICAL DATA:  51 year old male with a history of fecal impaction EXAM: ABDOMEN - 1 VIEW COMPARISON:  None. FINDINGS: Large formed stool within the rectum and sigmoid colon, not significantly changed from the comparison CT study. Ostomy apparatus not well visualized on the current plain film. Enteric contrast present within right colon, transverse colon, splenic flexure and descending colon. Less colonic gaseous distention than the comparison CT. Persisting gas within small bowel loops  centrally. Bilateral renal calculi are not well characterized on the current plain film with overlapping enteric contrast. Re- demonstration of scoliosis, spinal dysraphism/diastematomyelia. Re- demonstration of left hip dysplasia and chronic changes. Bases of the lungs unremarkable. No radiopaque foreign body. IMPRESSION: Large formed stool burden within the rectum and distal sigmoid colon, similar to the comparison study, compatible with persisting fecal impaction. Since the prior CT there has been decreased gaseous distention of the more proximal colon, with enteric contrast in the right colon, transverse colon, splenic flexure. Plain film demonstration of split cord/diastematomyelia, spinal dysraphism, scoliosis, hip dysplasia, all previously imaged on CT. Bilateral nephrolithiasis is not well demonstrated on the current plain film, partially  obscured by enteric contrast. Signed, Dulcy Fanny. Earleen Newport, DO [facility] Electronically Signed   By: Corrie Mckusick D.O.   On: 10/11/2016 07:51    EKG:   Orders placed or performed during the hospital encounter of 06/02/16  . ED EKG  . EKG 12-Lead  . EKG 12-Lead  . ED EKG      Management plans discussed with the patient, family and they are in agreement.  CODE STATUS:     Code Status Orders        Start     Ordered   10/10/16 0206  Full code  Continuous     10/10/16 0205    Code Status History    Date Active Date Inactive Code Status Order ID Comments User Context   06/03/2016  4:29 AM 06/08/2016  7:14 PM Full Code [id number]  Saundra Shelling, MD Inpatient   04/21/2016  2:38 AM 04/26/2016  6:15 PM Full Code [id number]  Harrie Foreman, MD Inpatient    Advance Directive Documentation   Flowsheet Row Most Recent Value  Type of Advance Directive  Healthcare Power of Attorney  Pre-existing out of facility DNR order (yellow form or pink MOST form)  No data  "MOST" Form in Place?  No data      TOTAL TIME TAKING CARE OF THIS PATIENT: 40 minutes.    Theodoro Grist M.D on 10/11/2016 at 2:09 PM  Between 7am to 6pm - Pager - [phone]  After 6pm go to [email or web address] - password [email or web address] [facility]  Office  [phone]  CC: Primary care physician; Norva Riffle, MD

## 2016-10-11 NOTE — Care Management (Signed)
Admitted with fecal impaction.  History of spina bifida.  Lives at home with sister.  PCP [facility].  Patient has WC and hospital bed in the home.  Patient has Medicaid Aide 9 hours a week through [employer].  Patient to discharge home today.  Sister to provide transportation at discharge.

## 2016-10-13 LAB — OVA + PARASITE EXAM

## 2016-10-13 LAB — O&P RESULT

## 2016-10-22 LAB — WBCS, STOOL: WBCS, STOOL: NONE SEEN

## 2017-05-07 ENCOUNTER — Emergency Department: Payer: Medicare Other

## 2017-05-07 ENCOUNTER — Emergency Department
Admission: EM | Admit: 2017-05-07 | Discharge: 2017-05-08 | Payer: Medicare Other | Attending: Emergency Medicine | Admitting: Emergency Medicine

## 2017-05-07 ENCOUNTER — Encounter: Payer: Self-pay | Admitting: Emergency Medicine

## 2017-05-07 DIAGNOSIS — R112 Nausea with vomiting, unspecified: Secondary | ICD-10-CM | POA: Diagnosis not present

## 2017-05-07 DIAGNOSIS — R1110 Vomiting, unspecified: Secondary | ICD-10-CM | POA: Diagnosis not present

## 2017-05-07 DIAGNOSIS — R197 Diarrhea, unspecified: Secondary | ICD-10-CM | POA: Diagnosis not present

## 2017-05-07 DIAGNOSIS — T819XXA Unspecified complication of procedure, initial encounter: Secondary | ICD-10-CM | POA: Diagnosis present

## 2017-05-07 DIAGNOSIS — Y69 Unspecified misadventure during surgical and medical care: Secondary | ICD-10-CM | POA: Diagnosis not present

## 2017-05-07 DIAGNOSIS — R1031 Right lower quadrant pain: Secondary | ICD-10-CM | POA: Insufficient documentation

## 2017-05-07 LAB — COMPREHENSIVE METABOLIC PANEL
ALBUMIN: 3.6 g/dL (ref 3.5–5.0)
ALK PHOS: 123 U/L (ref 38–126)
ALT: 146 U/L — ABNORMAL HIGH (ref 17–63)
ANION GAP: 12 (ref 5–15)
AST: 117 U/L — ABNORMAL HIGH (ref 15–41)
BUN: 29 mg/dL — ABNORMAL HIGH (ref 6–20)
CO2: 26 mmol/L (ref 22–32)
CREATININE: 1.29 mg/dL — AB (ref 0.61–1.24)
Calcium: 9.5 mg/dL (ref 8.9–10.3)
Chloride: 104 mmol/L (ref 101–111)
GFR calc Af Amer: >60 mL/min (ref >60)
GLUCOSE: 99 mg/dL (ref 65–99)
Potassium: 4.5 mmol/L (ref 3.5–5.1)
Sodium: 142 mmol/L (ref 135–145)
Total Bilirubin: 0.5 mg/dL (ref 0.3–1.2)
Total Protein: 9.1 g/dL — ABNORMAL HIGH (ref 6.5–8.1)

## 2017-05-07 LAB — CBC
HEMATOCRIT: 39.6 % — AB (ref 40.0–52.0)
Hemoglobin: 13.1 g/dL (ref 13.0–18.0)
MCH: 27.4 pg (ref 26.0–34.0)
MCHC: 33.1 g/dL (ref 32.0–36.0)
MCV: 82.9 fL (ref 80.0–100.0)
PLATELETS: 374 10*3/uL (ref 150–440)
RBC: 4.77 MIL/uL (ref 4.40–5.90)
RDW: 14.8 % — ABNORMAL HIGH (ref 11.5–14.5)
WBC: 12.9 10*3/uL — ABNORMAL HIGH (ref 3.8–10.6)

## 2017-05-07 MED ORDER — METOCLOPRAMIDE HCL 5 MG/ML IJ SOLN
10.0000 mg | Freq: Once | INTRAMUSCULAR | Status: AC
  Administered 2017-05-07: 10 mg via INTRAVENOUS

## 2017-05-07 MED ORDER — ONDANSETRON 4 MG PO TBDP
ORAL_TABLET | ORAL | Status: AC
  Administered 2017-05-07: 4 mg via ORAL
  Filled 2017-05-07: qty 1

## 2017-05-07 MED ORDER — BARIUM SULFATE 2.1 % PO SUSP
450.0000 mL | ORAL | Status: AC
  Administered 2017-05-07 (×2): 450 mL via ORAL

## 2017-05-07 MED ORDER — ONDANSETRON HCL 4 MG/2ML IJ SOLN
4.0000 mg | Freq: Once | INTRAMUSCULAR | Status: AC
  Administered 2017-05-07: 4 mg via INTRAVENOUS
  Filled 2017-05-07: qty 2

## 2017-05-07 MED ORDER — SODIUM CHLORIDE 0.9 % IV BOLUS (SEPSIS)
1000.0000 mL | Freq: Once | INTRAVENOUS | Status: AC
  Administered 2017-05-07: 1000 mL via INTRAVENOUS

## 2017-05-07 MED ORDER — METOCLOPRAMIDE HCL 5 MG/ML IJ SOLN
INTRAMUSCULAR | Status: AC
  Administered 2017-05-07: 10 mg via INTRAVENOUS
  Filled 2017-05-07: qty 2

## 2017-05-07 MED ORDER — ONDANSETRON 4 MG PO TBDP
4.0000 mg | ORAL_TABLET | Freq: Once | ORAL | Status: AC
  Administered 2017-05-07: 4 mg via ORAL

## 2017-05-07 NOTE — ED Notes (Signed)
I have reviewed EMTALA and it is complete.

## 2017-05-07 NOTE — ED Notes (Signed)
Unable to obtain IV access x2 different RN attempts. EDP Lord notified. Orders received to give ODT zofran and have pt attempt barium contrast after 15-20 min. Pt updated and in agreement with plan.

## 2017-05-07 NOTE — ED Notes (Signed)
3rd RN attempted IV access with no success. Notified EDP Schaevitz who will try ultrasound access. Ultrasound placed in room with IV supplies. Pt updated on plan of care.

## 2017-05-07 NOTE — ED Provider Notes (Signed)
  Physical Exam  BP (!) 129/93   Pulse 89   Temp 99.3 F (37.4 C) (Axillary)   Resp 18   Ht 4\' 11"  (1.499 m)   Wt 41.7 kg (92 lb)   SpO2 97%   BMI 18.58 kg/m   Physical Exam Patient resting without any distress at this time.  Angiocath insertion Performed by: Doran Stabler  Consent: Verbal consent obtained. Risks and benefits: risks, benefits and alternatives were discussed Time out: Immediately prior to procedure a "time out" was called to verify the correct patient, procedure, equipment, support staff and site/side marked as required.  Preparation: Patient was prepped and draped in the usual sterile fashion.  Vein Location: Right antecubital fossa  Ultrasound Guided  Gauge: 18  Normal blood return and flush without difficulty Patient tolerance: Patient tolerated the procedure well with no immediate complications.     ED Course  Procedures  MDM ----------------------------------------- 10:16 PM on 05/07/2017 -----------------------------------------  Patient vomited his by mouth contrast. Continues to feel nauseous. Given 10 of Reglan. 2 calls made to [facility] surgical consult service without return of call. I then discussed with Dr. Tollie Pizza here at [facility]. Dr. Charlean Sanfilippo recommends the patient be transferred to [facility]. We then called the [facility] transfer center and I spoke with Dr. Sharol Roussel who accepts the patient to the surgical service. The patient and his sister understanding of this plan and willing to comply per patient will be transferred at this time.  CT without any small bowel obstruction. Still continues to flow through the ostomy. Patient continues to have thick brown stool from his rectum.       Orbie Pyo, MD 05/07/17 2217

## 2017-05-07 NOTE — ED Provider Notes (Signed)
[facility] Emergency Department Provider Note ____________________________________________   I have reviewed the triage vital signs and the triage nursing note.  HISTORY  Chief Complaint Post-op Problem and Emesis   Historian Patient  HPI [name] is a 51 y.o. male with a history of spina bifida, recent colostomy placed at [facility] on Monday for bowel control.  Patient states that for 24 hours he's had nausea vomiting and diarrhea. He is having output into his colostomy bag as well as stool from his rectum.  No fever. No coughing or trouble breathing. Abdominal discomfort slightly in the right side abdomen, mild to moderate currently mild.  Nothing makes it worse or better.    Past Medical History:  Diagnosis Date  . Decubitus ulcer   . Renal disorder   . Spina bifida [facility])     Patient Active Problem List   Diagnosis Date Noted  . Abdominal distention 10/11/2016  . Distal intestinal obstruction syndrome ([place]) 10/11/2016  . Hypokalemia 10/11/2016  . Leukocytosis 10/11/2016  . Loose stools 10/11/2016  . Fecal impaction in rectum ([place]) 10/10/2016  . Nausea & vomiting 06/03/2016  . Gastric outlet obstruction 06/03/2016  . SIRS (systemic inflammatory response syndrome) ([place]) 06/03/2016  . Pressure ulcer 06/03/2016  . Impaction of the bowels ([place])   . Gastritis   . Ulcer of esophagus with bleeding   . Nausea with vomiting   . Nausea vomiting and diarrhea   . Sepsis ([place]) 04/21/2016    Past Surgical History:  Procedure Laterality Date  . ABDOMINAL SURGERY    . BACK SURGERY    . COLON SURGERY    . ESOPHAGOGASTRODUODENOSCOPY (EGD) WITH PROPOFOL N/A 06/03/2016   Procedure: ESOPHAGOGASTRODUODENOSCOPY (EGD) WITH PROPOFOL;  Surgeon: [name], MD;  Location: [facility] ENDOSCOPY;  Service: Endoscopy;  Laterality: N/A;  . ilieostomy    . VENTRICULOPERITONEAL SHUNT    . vp shunt removal      Prior to Admission medications   Medication Sig Start  Date End Date Taking? Authorizing Provider  feeding supplement, ENSURE ENLIVE, (ENSURE ENLIVE) LIQD Take 237 mLs by mouth 2 (two) times daily between meals. 04/26/16   Fritzi Mandes, MD  polyethylene glycol [facility] / Floria Raveling) packet Take 17 g by mouth 2 (two) times daily. 10/11/16   Theodoro Grist, MD    Allergies  Allergen Reactions  . Ivp Dye [Iodinated Diagnostic Agents] Hives  . Morphine And Related Nausea And Vomiting  . Sulfa Antibiotics Other (See Comments)    Reaction: unknown  . Latex Rash    Family History  Problem Relation Age of Onset  . Diabetes Mellitus II Father     Social History Social History  Substance Use Topics  . Smoking status: Never Smoker  . Smokeless tobacco: Never Used  . Alcohol use No    Review of Systems  Constitutional: Negative for fever. Eyes: Negative for visual changes. ENT: Negative for sore throat. Cardiovascular: Negative for chest pain. Respiratory: Negative for shortness of breath. Gastrointestinal: Negative for Bloody emesis or bloody stool. Genitourinary: Negative for dysuria. Musculoskeletal: Negative for back pain. Skin: Negative for rash. Neurological: Negative for headache.  ____________________________________________   PHYSICAL EXAM:  VITAL SIGNS: ED Triage Vitals  Enc Vitals Group     BP      Pulse      Resp      Temp      Temp src      SpO2      Weight      Height  Head Circumference      Peak Flow      Pain Score      Pain Loc      Pain Edu?      Excl. in [place]?      Constitutional: Alert and oriented. Well appearing and in no distress. HEENT   Head: Normocephalic and atraumatic.      Eyes: Conjunctivae are normal. Pupils equal and round. Slight disconjugate gaze.      Ears:         Nose: No congestion/rhinnorhea.   Mouth/Throat: Mucous membranes are moist.   Neck: No stridor. Cardiovascular/Chest: Normal rate, regular rhythm.  No murmurs, rubs, or gallops. Respiratory: Normal  respiratory effort without tachypnea nor retractions. Breath sounds are clear and equal bilaterally. No wheezes/rales/rhonchi. Gastrointestinal: Soft. No distention, no guarding, no rebound. Mild tenderness at the right side of the colostomy bag although the skin around the colostomy site looks good. Brown stool in the colostomy bag. No abdominal distention.  Genitourinary/rectal:Deferred Musculoskeletal: Contractures to the lower extremities. Neurologic:  Normal speech and language. No gross or focal neurologic deficits are appreciated. Skin:  Skin is warm, dry and intact. No rash noted. Psychiatric: Mood and affect are normal. Speech and behavior are normal. Patient exhibits appropriate insight and judgment.   ____________________________________________  LABS (pertinent positives/negatives)  Labs Reviewed  COMPREHENSIVE METABOLIC PANEL  CBC    ____________________________________________    EKG I, Lisa Roca, MD, the attending physician have personally viewed and interpreted all ECGs.  None ____________________________________________  RADIOLOGY All Xrays were viewed by me. Imaging interpreted by Radiologist.  Abdomen chest x-ray:  IMPRESSION: 1. Large amount of stool in the rectal vault and/or of sigmoid colon. This appears similar to the previous exams. 2. No evidence of bowel obstruction seen. __________________________________________  PROCEDURES  Procedure(s) performed: None  Critical Care performed: None  ____________________________________________   ED COURSE / ASSESSMENT AND PLAN  Pertinent labs & imaging results that were available during my care of the patient were reviewed by me and considered in my medical decision making (see chart for details).   [name] is overall well-appearing with stable vital signs and soft abdomen but he is complaining of some right-sided abdominal pain and he did have some nausea and heaving here. His symptoms seem  likely consistent with a viral GI illness, however given his recent colostomy placement, I am going to CT the abdomen for further investigation and ensure no post surgical complications.  Patient care to be transferred to Dr. Clearnce Hasten at shift change 3pm.  Awaiting CT results for dispo.     CONSULTATIONS:  None  Patient / Family / Caregiver informed of clinical course, medical decision-making process, and agree with plan.   ___________________________________________   FINAL CLINICAL IMPRESSION(S) / ED DIAGNOSES   Final diagnoses:  Nausea vomiting and diarrhea  Right lower quadrant abdominal pain              Note: This dictation was prepared with Dragon dictation. Any transcriptional errors that result from this process are unintentional    Lisa Roca, MD 05/07/17 1439

## 2017-11-02 ENCOUNTER — Other Ambulatory Visit: Payer: Self-pay

## 2017-11-02 ENCOUNTER — Inpatient Hospital Stay
Admission: EM | Admit: 2017-11-02 | Discharge: 2017-11-05 | DRG: 389 | Disposition: A | Discharge status: Home / Self Care | Payer: Medicare Other | Attending: Internal Medicine | Admitting: Internal Medicine

## 2017-11-02 ENCOUNTER — Emergency Department: Payer: Medicare Other

## 2017-11-02 DIAGNOSIS — K56609 Unspecified intestinal obstruction, unspecified as to partial versus complete obstruction: Secondary | ICD-10-CM | POA: Diagnosis present

## 2017-11-02 DIAGNOSIS — K567 Ileus, unspecified: Secondary | ICD-10-CM | POA: Diagnosis present

## 2017-11-02 DIAGNOSIS — K5900 Constipation, unspecified: Secondary | ICD-10-CM | POA: Diagnosis present

## 2017-11-02 DIAGNOSIS — Z882 Allergy status to sulfonamides status: Secondary | ICD-10-CM

## 2017-11-02 DIAGNOSIS — Q059 Spina bifida, unspecified: Secondary | ICD-10-CM | POA: Diagnosis not present

## 2017-11-02 DIAGNOSIS — Z91040 Latex allergy status: Secondary | ICD-10-CM | POA: Diagnosis not present

## 2017-11-02 DIAGNOSIS — N189 Chronic kidney disease, unspecified: Secondary | ICD-10-CM | POA: Diagnosis present

## 2017-11-02 DIAGNOSIS — Z79899 Other long term (current) drug therapy: Secondary | ICD-10-CM

## 2017-11-02 DIAGNOSIS — N179 Acute kidney failure, unspecified: Secondary | ICD-10-CM | POA: Diagnosis not present

## 2017-11-02 DIAGNOSIS — Z936 Other artificial openings of urinary tract status: Secondary | ICD-10-CM | POA: Diagnosis not present

## 2017-11-02 DIAGNOSIS — Z91041 Radiographic dye allergy status: Secondary | ICD-10-CM

## 2017-11-02 DIAGNOSIS — N136 Pyonephrosis: Secondary | ICD-10-CM | POA: Diagnosis present

## 2017-11-02 DIAGNOSIS — K5289 Other specified noninfective gastroenteritis and colitis: Secondary | ICD-10-CM | POA: Diagnosis present

## 2017-11-02 DIAGNOSIS — R112 Nausea with vomiting, unspecified: Secondary | ICD-10-CM

## 2017-11-02 DIAGNOSIS — R197 Diarrhea, unspecified: Secondary | ICD-10-CM

## 2017-11-02 HISTORY — DX: Unspecified intestinal obstruction, unspecified as to partial versus complete obstruction: K56.609

## 2017-11-02 LAB — COMPREHENSIVE METABOLIC PANEL
ALBUMIN: 4.2 g/dL (ref 3.5–5.0)
ALT: 16 U/L — AB (ref 17–63)
AST: 30 U/L (ref 15–41)
Alkaline Phosphatase: 97 U/L (ref 38–126)
Anion gap: 17 — ABNORMAL HIGH (ref 5–15)
BILIRUBIN TOTAL: 0.9 mg/dL (ref 0.3–1.2)
BUN: 55 mg/dL — AB (ref 6–20)
CO2: 23 mmol/L (ref 22–32)
CREATININE: 2.22 mg/dL — AB (ref 0.61–1.24)
Calcium: 9.1 mg/dL (ref 8.9–10.3)
Chloride: 96 mmol/L — ABNORMAL LOW (ref 101–111)
GFR calc Af Amer: 38 mL/min — ABNORMAL LOW (ref >60)
GFR, EST NON AFRICAN AMERICAN: 33 mL/min — AB (ref >60)
GLUCOSE: 133 mg/dL — AB (ref 65–99)
POTASSIUM: 4 mmol/L (ref 3.5–5.1)
Sodium: 136 mmol/L (ref 135–145)
TOTAL PROTEIN: 9.1 g/dL — AB (ref 6.5–8.1)

## 2017-11-02 LAB — URINALYSIS, COMPLETE (UACMP) WITH MICROSCOPIC
Bilirubin Urine: NEGATIVE
GLUCOSE, UA: NEGATIVE mg/dL
Ketones, ur: 5 mg/dL — AB
NITRITE: NEGATIVE
PH: 7 (ref 5.0–8.0)
Protein, ur: >=300 mg/dL — AB
SPECIFIC GRAVITY, URINE: 1.013 (ref 1.005–1.030)

## 2017-11-02 LAB — CBC
HEMATOCRIT: 45.9 % (ref 40.0–52.0)
Hemoglobin: 14.7 g/dL (ref 13.0–18.0)
MCH: 26.5 pg (ref 26.0–34.0)
MCHC: 31.9 g/dL — AB (ref 32.0–36.0)
MCV: 83.1 fL (ref 80.0–100.0)
PLATELETS: 411 10*3/uL (ref 150–440)
RBC: 5.53 MIL/uL (ref 4.40–5.90)
RDW: 16.8 % — AB (ref 11.5–14.5)
WBC: 10.8 10*3/uL — AB (ref 3.8–10.6)

## 2017-11-02 LAB — LIPASE, BLOOD: Lipase: 27 U/L (ref 11–51)

## 2017-11-02 MED ORDER — SODIUM CHLORIDE 0.9 % IV SOLN
INTRAVENOUS | Status: DC
  Administered 2017-11-02 – 2017-11-04 (×4): via INTRAVENOUS

## 2017-11-02 MED ORDER — ACETAMINOPHEN 650 MG RE SUPP: 650.0000 mg | Freq: Four times a day (QID) | RECTAL | Status: DC | PRN

## 2017-11-02 MED ORDER — ONDANSETRON HCL 4 MG/2ML IJ SOLN
4.0000 mg | Freq: Once | INTRAMUSCULAR | Status: AC
  Administered 2017-11-02: 4 mg via INTRAVENOUS
  Filled 2017-11-02: qty 2

## 2017-11-02 MED ORDER — ACETAMINOPHEN 325 MG PO TABS: 650.0000 mg | ORAL_TABLET | Freq: Four times a day (QID) | ORAL | Status: DC | PRN

## 2017-11-02 MED ORDER — SODIUM CHLORIDE 0.9 % IV BOLUS (SEPSIS)
1000.0000 mL | Freq: Once | INTRAVENOUS | Status: AC
  Administered 2017-11-02: 1000 mL via INTRAVENOUS

## 2017-11-02 MED ORDER — METRONIDAZOLE IN NACL 5-0.79 MG/ML-% IV SOLN
500.0000 mg | Freq: Four times a day (QID) | INTRAVENOUS | Status: DC
  Administered 2017-11-02 – 2017-11-04 (×8): 500 mg via INTRAVENOUS
  Filled 2017-11-02 (×12): qty 100

## 2017-11-02 MED ORDER — CIPROFLOXACIN IN D5W 400 MG/200ML IV SOLN
400.0000 mg | Freq: Two times a day (BID) | INTRAVENOUS | Status: DC
  Administered 2017-11-02: 400 mg via INTRAVENOUS
  Filled 2017-11-02 (×2): qty 200

## 2017-11-02 MED ORDER — ONDANSETRON HCL 4 MG PO TABS: 4.0000 mg | ORAL_TABLET | Freq: Four times a day (QID) | ORAL | Status: DC | PRN

## 2017-11-02 MED ORDER — BISACODYL 5 MG PO TBEC: 5.0000 mg | DELAYED_RELEASE_TABLET | Freq: Every day | ORAL | Status: DC | PRN

## 2017-11-02 MED ORDER — SENNOSIDES-DOCUSATE SODIUM 8.6-50 MG PO TABS: 1.0000 | ORAL_TABLET | Freq: Every evening | ORAL | Status: DC | PRN

## 2017-11-02 MED ORDER — ALBUTEROL SULFATE (2.5 MG/3ML) 0.083% IN NEBU: 2.5000 mg | INHALATION_SOLUTION | RESPIRATORY_TRACT | Status: DC | PRN

## 2017-11-02 MED ORDER — PROMETHAZINE HCL 25 MG/ML IJ SOLN
12.5000 mg | Freq: Four times a day (QID) | INTRAMUSCULAR | Status: DC | PRN
  Administered 2017-11-02: 12.5 mg via INTRAVENOUS
  Filled 2017-11-02: qty 1

## 2017-11-02 MED ORDER — ONDANSETRON HCL 4 MG/2ML IJ SOLN
4.0000 mg | Freq: Four times a day (QID) | INTRAMUSCULAR | Status: DC | PRN
  Administered 2017-11-02: 4 mg via INTRAVENOUS
  Filled 2017-11-02: qty 2

## 2017-11-02 MED ORDER — CIPROFLOXACIN IN D5W 400 MG/200ML IV SOLN: 400.0000 mg | INTRAVENOUS | Status: DC

## 2017-11-02 MED ORDER — HEPARIN SODIUM (PORCINE) 5000 UNIT/ML IJ SOLN
5000.0000 [IU] | Freq: Three times a day (TID) | INTRAMUSCULAR | Status: DC
  Administered 2017-11-02 – 2017-11-05 (×8): 5000 [IU] via SUBCUTANEOUS
  Filled 2017-11-02 (×9): qty 1

## 2017-11-02 NOTE — Consult Note (Signed)
Patient ID: [name], male   DOB: Aug 05, 1966, 52 y.o.   MRN: [id number]  HPI [name] is a 52 y.o. male Asked to see in consultation by Dr. Clearnce Hasten, case d/w him in detail. He presented today with a to three-day history of abdominal pain that is diffuse, intermittent moderate in intensity. Crampy type. He also reports some nausea and vomiting. Decreasing output from the ileoconduit. f note he did have a history of double barrel colostomy performed at [facility] by Dr. Emelda Brothers more than 1 year ago. Family reports significant issues with perineum and diarrhea constipation. He follows him on a routine basis. CT scan personally reviewed, there is evidence of dilated loops of small bowel and with some distal decompression.There is Double colostomy in the l quadrant. There is significant stool in the distal limb  HPI  Past Medical History:  Diagnosis Date  . Decubitus ulcer   . Renal disorder   . Spina bifida [facility])     Past Surgical History:  Procedure Laterality Date  . ABDOMINAL SURGERY    . BACK SURGERY    . COLON SURGERY    . ESOPHAGOGASTRODUODENOSCOPY (EGD) WITH PROPOFOL N/A 06/03/2016   Procedure: ESOPHAGOGASTRODUODENOSCOPY (EGD) WITH PROPOFOL;  Surgeon: [name], MD;  Location: [facility] ENDOSCOPY;  Service: Endoscopy;  Laterality: N/A;  . ilieostomy    . VENTRICULOPERITONEAL SHUNT    . vp shunt removal      Family History  Problem Relation Age of Onset  . Diabetes Mellitus II Father     Social History Social History   Tobacco Use  . Smoking status: Never Smoker  . Smokeless tobacco: Never Used  Substance Use Topics  . Alcohol use: No  . Drug use: No    Allergies  Allergen Reactions  . Ivp Dye [Iodinated Diagnostic Agents] Hives  . Morphine And Related Nausea And Vomiting  . Sulfa Antibiotics Other (See Comments)    Reaction: unknown  . Latex Rash    Current Facility-Administered Medications  Medication Dose Route Frequency Provider Last Rate Last Dose   . 0.9 %  sodium chloride infusion   Intravenous Continuous Demetrios Loll, MD      . acetaminophen (TYLENOL) tablet 650 mg  650 mg Oral Q6H PRN Demetrios Loll, MD       Or  . acetaminophen (TYLENOL) suppository 650 mg  650 mg Rectal Q6H PRN Demetrios Loll, MD      . albuterol (PROVENTIL) (2.5 MG/3ML) 0.083% nebulizer solution 2.5 mg  2.5 mg Nebulization Q2H PRN Demetrios Loll, MD      . bisacodyl (DULCOLAX) EC tablet 5 mg  5 mg Oral Daily PRN Demetrios Loll, MD      . ciprofloxacin (CIPRO) IVPB 400 mg  400 mg Intravenous Annitta Needs, MD 200 mL/hr at 11/02/17 1818 400 mg at 11/02/17 1818  . heparin injection 5,000 Units  5,000 Units Subcutaneous Q8H Demetrios Loll, MD      . metroNIDAZOLE (FLAGYL) IVPB 500 mg  500 mg Intravenous Q6H Demetrios Loll, MD 100 mL/hr at 11/02/17 1818 500 mg at 11/02/17 1818  . ondansetron (ZOFRAN) tablet 4 mg  4 mg Oral Q6H PRN Demetrios Loll, MD       Or  . ondansetron [facility]) injection 4 mg  4 mg Intravenous Q6H PRN Demetrios Loll, MD      . senna-docusate (Senokot-S) tablet 1 tablet  1 tablet Oral QHS PRN Demetrios Loll, MD         Review of Systems Full ROS  was asked and was negative except for the information on the HPI  Physical Exam Blood pressure 117/80, pulse 86, temperature 98.2 F (36.8 C), temperature source Axillary, resp. rate 16, height 5\' 3"  (1.6 m), weight 43.1 kg (95 lb), SpO2 99 %. CONSTITUTIONAL: Debilitated male in NAD EYES: Pupils are equal, round, and reactive to light, Sclera are non-icteric. EARS, NOSE, MOUTH AND THROAT: The oropharynx is clear. The oral mucosa is pink and moist. Hearing is intact to voice. LYMPH NODES:  Lymph nodes in the neck are normal. RESPIRATORY:  Lungs are clear. There is normal respiratory effort, with equal breath sounds bilaterally, and without pathologic use of accessory muscles. CARDIOVASCULAR: Heart is regular without murmurs, gallops, or rubs. GI: The abdomen is  Soft,mild tenderness suprapubic area, I can feel stool burden. Hard stool  within colostomy. Ileal conduit w urine, No peritonitis. Decrease BS GU: Rectal deferred.   MUSCULOSKELETAL: Normal muscle strength and tone. No cyanosis or edema.   SKIN: Turgor is good and there are no pathologic skin lesions or ulcers. NEUROLOGIC:Spina bifida sequela PSYCH:  Oriented to person, place and time. Affect is normal.  Data Reviewed  I have personally reviewed the patient's imaging, laboratory findings and medical records.    Assessment/ Plan 60 with a history of spina bifida and previous colostomy now with ileus versus partial small bowel obstruction on top of dehydration and urinary infection with questionable obstructive process. Since to have had a complex surgical history.  currently there is no peritonitis or any need fo immediate surgery at this time.  I recommend IV hydration, nothing by mouth and serial abdominal exams. I will order an abdominal x-rafor the morning. He may need an NG tube. Currently he is not having any nausea vomiting.  Caroleen Hamman, MD FACS General Surgeon 11/02/2017, 6:50 PM

## 2017-11-02 NOTE — ED Notes (Signed)
Patient transported to CT 

## 2017-11-02 NOTE — H&P (Addendum)
[place] [facility] [place] NAME: [name]    MR#:  [id number]  DATE OF BIRTH:  05-01-1966  DATE OF ADMISSION:  11/02/2017  PRIMARY CARE PHYSICIAN: Norva Riffle, MD   REQUESTING/REFERRING PHYSICIAN: Orbie Pyo, MD  CHIEF COMPLAINT:   Chief Complaint  Patient presents with  . Abdominal Pain  . Emesis   Abdominal pain and distention, nausea and vomiting for the past 4 days. HISTORY OF PRESENT ILLNESS:  [name]  is a 52 y.o. male with a known history of spina bifida, renal disorder and DU.  He had colostomy and nephrostomy secondary to kidney stone.  He presents the ED with above chief complaints.  The abdominal pain is diffuse with distention, associated with nausea and vomiting.  He denies any fever or chills.  CAT scan of the abdomen show small bowel obstruction, colitis and hydronephrosis with kidney stone.  Per ED physician, general surgeon request admitting the patient to medical service.  PAST MEDICAL HISTORY:   Past Medical History:  Diagnosis Date  . Decubitus ulcer   . Renal disorder   . Spina bifida ([place])     PAST SURGICAL HISTORY:   Past Surgical History:  Procedure Laterality Date  . ABDOMINAL SURGERY    . BACK SURGERY    . COLON SURGERY    . ESOPHAGOGASTRODUODENOSCOPY (EGD) WITH PROPOFOL N/A 06/03/2016   Procedure: ESOPHAGOGASTRODUODENOSCOPY (EGD) WITH PROPOFOL;  Surgeon: [name], MD;  Location: [facility] ENDOSCOPY;  Service: Endoscopy;  Laterality: N/A;  . ilieostomy    . VENTRICULOPERITONEAL SHUNT    . vp shunt removal      SOCIAL HISTORY:   Social History   Tobacco Use  . Smoking status: Never Smoker  . Smokeless tobacco: Never Used  Substance Use Topics  . Alcohol use: No    FAMILY HISTORY:   Family History  Problem Relation Age of Onset  . Diabetes Mellitus II Father     DRUG ALLERGIES:   Allergies  Allergen Reactions  . Ivp Dye [Iodinated Diagnostic Agents]  Hives  . Morphine And Related Nausea And Vomiting  . Sulfa Antibiotics Other (See Comments)    Reaction: unknown  . Latex Rash    REVIEW OF SYSTEMS:   Review of Systems  Constitutional: Negative for chills, fever and malaise/fatigue.  HENT: Negative for sore throat.   Eyes: Negative for blurred vision and double vision.  Respiratory: Negative for cough, hemoptysis, shortness of breath, wheezing and stridor.   Cardiovascular: Negative for chest pain, palpitations, orthopnea and leg swelling.  Gastrointestinal: Positive for abdominal pain, nausea and vomiting. Negative for blood in stool, diarrhea and melena.  Genitourinary: Negative for dysuria, flank pain and hematuria.  Musculoskeletal: Negative for back pain and joint pain.  Skin: Negative for rash.  Neurological: Negative for dizziness, sensory change, focal weakness, seizures, loss of consciousness, weakness and headaches.  Endo/Heme/Allergies: Negative for polydipsia.  Psychiatric/Behavioral: Negative for depression. The patient is not nervous/anxious.     MEDICATIONS AT HOME:   Prior to Admission medications   Medication Sig Start Date End Date Taking? Authorizing Provider  feeding supplement, ENSURE ENLIVE, (ENSURE ENLIVE) LIQD Take 237 mLs by mouth 2 (two) times daily between meals. Patient not taking: Reported on 05/07/2017 04/26/16   Fritzi Mandes, MD  Multiple Vitamins-Minerals (CENTRUM MEN PO) Take by mouth.    [provider]  polyethylene glycol (MIRALAX / GLYCOLAX) packet Take 17 g by mouth 2 (two) times daily. 10/11/16  Theodoro Grist, MD      VITAL SIGNS:  Blood pressure 109/81, pulse (!) 105, temperature 98.1 F (36.7 C), temperature source Axillary, resp. rate 15, height 5\' 3"  (1.6 m), weight 95 lb (43.1 kg), SpO2 98 %.  PHYSICAL EXAMINATION:  Physical Exam  GENERAL:  52 y.o.-year-old patient lying in the bed with no acute distress.  EYES: Left eye blind. No scleral icterus. Extraocular muscles intact.   HEENT: Head atraumatic, normocephalic. Oropharynx and nasopharynx clear.  NECK:  Supple, no jugular venous distention. No thyroid enlargement, no tenderness.  LUNGS: Normal breath sounds bilaterally, no wheezing, rales,rhonchi or crepitation. No use of accessory muscles of respiration.  CARDIOVASCULAR: S1, S2 normal. No murmurs, rubs, or gallops.  ABDOMEN: Abdominal tenderness and distention, hypoactive bowel sounds. EXTREMITIES: No pedal edema, cyanosis, or clubbing.  NEUROLOGIC: Cranial nerves II through XII are intact.  Bilateral lower extremity atrophy and paralyzed. PSYCHIATRIC: The patient is alert and oriented x 3.  SKIN: No obvious rash, lesion, or ulcer.   LABORATORY PANEL:   CBC Recent Labs  Lab 11/02/17 1219  WBC 10.8*  HGB 14.7  HCT 45.9  PLT 411   ------------------------------------------------------------------------------------------------------------------  Chemistries  Recent Labs  Lab 11/02/17 1219  NA 136  K 4.0  CL 96*  CO2 23  GLUCOSE 133*  BUN 55*  CREATININE 2.22*  CALCIUM 9.1  AST 30  ALT 16*  ALKPHOS 97  BILITOT 0.9   ------------------------------------------------------------------------------------------------------------------  Cardiac Enzymes No results for input(s): TROPONINI in the last 168 hours. ------------------------------------------------------------------------------------------------------------------  RADIOLOGY:  Ct Renal Stone Study  Result Date: 11/02/2017 CLINICAL DATA:  Vomiting and diarrhea since Monday, generalized abdominal cramping EXAM: CT ABDOMEN AND PELVIS WITHOUT CONTRAST TECHNIQUE: Multidetector CT imaging of the abdomen and pelvis was performed following the standard protocol without IV contrast. Sagittal and coronal MPR images reconstructed from axial data set. Oral contrast was not administered. COMPARISON:  05/07/2017 FINDINGS: Lower chest: Lung bases clear Hepatobiliary: Gallbladder and liver unremarkable  Pancreas: Normal appearance Spleen: Small, otherwise unremarkable Adrenals/Urinary Tract: Adrenal glands normal appearance. Numerous large calculi within RIGHT kidney including a staghorn calculus at the upper to mid RIGHT kidney unchanged. Significant decrease in calculi within LEFT kidney since the previous exam though LEFT hydronephrosis persists. Bladder decompressed. Ostomy in RIGHT lower quadrant appears to be urostomy with the BILATERAL mid ureters deviating towards the ostomy rather than towards the bladder. Stomach/Bowel: Markedly increased stool within the rectum and sigmoid colon with rectal wall thickening compatible with stercoral colitis. Additional ostomy in the LEFT lower quadrant, appears to be a sigmoid loop colostomy. Stomach is distended by fluid and air. Dilated proximal small bowel loops in the upper abdomen up to 5.6 cm diameter with decompressed distal small bowel loops consistent with small bowel obstruction. Question transition zone in the RIGHT mid abdomen. No definite small bowel wall thickening. Proximal, transverse and descending colon decompressed. Vascular/Lymphatic: Aorta normal caliber.  No definite adenopathy. Reproductive: N/A Other: No free air or free fluid.  No hernia. Musculoskeletal: Diffusely demineralized with thoracolumbar dysraphic is features and scoliosis. Degenerative changes of both hip joints much greater on LEFT, with LEFT hip joint effusion present. IMPRESSION: Dilated proximal and decompressed distal small bowel loops compatible with small bowel obstruction, with transition zone in the RIGHT mid abdomen. Double-barrel colostomy in the LEFT lower quadrant with markedly increased stool in the distal limb/rectosigmoid colon associated with rectal wall thickening consistent with stercoral colitis. Apparent urinary diversion in the RIGHT lower quadrant with persistent LEFT hydronephrosis and  numerous BILATERAL renal calculi including staghorn calculi in the RIGHT  kidney; significant interval decrease in stone burden within the LEFT kidney. Extensive spinal dysraphism consistent with spina bifida. Electronically Signed   By: Lavonia Dana M.D.   On: 11/02/2017 13:48      IMPRESSION AND PLAN:   Small bowel obstruction. The patient will be admitted to medical floor. Keep n.p.o. with IV fluid support, follow-up general surgeon.  Acute stercoral colitis.  Start Cipro and Flagyl IV, follow-up CBC.  UTI per urinalysis.  Continue Cipro and follow-up urine culture.  Acute renal failure, possible due to dehydration Start IV fluid support and follow-up BMP.  LEFT hydronephrosis and numerous BILATERAL renal calculi Urology consult.  Continue nephrostomy tube.  spina bifida.  All the records are reviewed and case discussed with ED provider. Management plans discussed with the patient, family and they are in agreement.  CODE STATUS: Full code  TOTAL TIME TAKING CARE OF THIS PATIENT: 56 minutes.    Demetrios Loll M.D on 11/02/2017 at 2:44 PM  Between 7am to 6pm - Pager - [phone]  After 6pm go to [email or web address] - Proofreader  [facility] [place] [facility]  Office  [phone]  CC: Primary care physician; Norva Riffle, MD   Note: This dictation was prepared with Dragon dictation along with smaller phrase technology. Any transcriptional errors that result from this process are unin

## 2017-11-02 NOTE — ED Notes (Signed)
Attempted IV access x1 in right AC without success

## 2017-11-02 NOTE — Progress Notes (Signed)
Patient admitted for SBO placed on cipro/flagyl. Patient was put on cipro 400 mg IV q12h Patient's CrCl < 30 ml/min (24.3 ml/min)  Based on CrCl patient should be on cipro 400 mg IV q24h. Spoke to MD and agreeable to adjusting dose.  Will adjust cipro to 400 mg IV q24h.  Tobie Lords, PharmD, BCPS Clinical Pharmacist 11/02/2017

## 2017-11-02 NOTE — Consult Note (Signed)
Urology Consult  I have been asked to see the patient by Dr. Bridgett Larsson, for evaluation and management of possible UTI, nephrolithiasis, acute renal failure.  Chief Complaint: Fecal impaction  History of Present Illness: [name] is a 52 y.o. year old male with a history of spina bifida, neurogenic bladder status post ileal conduit with conduit revision, nephrolithiasis who presented to the emergency room complaining of abdominal pain, distention, nausea and vomiting and poor p.o. intake for the last several days as result of this.  As part of the workup, he underwent a noncontrast CT scan which showed a possible small bowel obstruction.  In addition, he was found to have stable bilateral chronic hydronephrosis with bilateral nephrolithiasis, significantly improved since his last CT scan following recent PCNL at [facility].Marland Kitchen    He denies any other symptoms including no flank pain, vomiting, fevers or chills. Other than his bowel issues, he is no complaints. His uroostomy is draining well.  Urinalysis is somewhat suspicious although was drained directly from his bag and not obtained within his urostomy.  Urine cultures pending.  He has no significant leukocytosis.  He does have notable acute on chronic renal insufficiency, creatinine 2.22 today from his previous baseline of 1.29.  He does have a history of recurrent urinary tract infections and urosepsis. His last serious infection was in 2016 with Vanc resistant enterococcus only sensitive to Erma.  He is followed by [facility].     Past Medical History:  Diagnosis Date  . Decubitus ulcer   . Renal disorder   . Spina bifida [facility])     Past Surgical History:  Procedure Laterality Date  . ABDOMINAL SURGERY    . BACK SURGERY    . COLON SURGERY    . ESOPHAGOGASTRODUODENOSCOPY (EGD) WITH PROPOFOL N/A 06/03/2016   Procedure: ESOPHAGOGASTRODUODENOSCOPY (EGD) WITH PROPOFOL;  Surgeon: [name], MD;  Location: [facility] ENDOSCOPY;   Service: Endoscopy;  Laterality: N/A;  . ilieostomy    . VENTRICULOPERITONEAL SHUNT    . vp shunt removal      Home Medications:  Current Meds  Medication Sig  . Multiple Vitamins-Minerals (CENTRUM MEN PO) Take 1 tablet by mouth daily.   . ondansetron (ZOFRAN-ODT) 4 MG disintegrating tablet Take 4 mg by mouth every 8 (eight) hours as needed.  . polyethylene glycol (MIRALAX / GLYCOLAX) packet Take 17 g by mouth 2 (two) times daily.    Allergies:  Allergies  Allergen Reactions  . Ivp Dye [Iodinated Diagnostic Agents] Hives  . Morphine And Related Nausea And Vomiting  . Sulfa Antibiotics Other (See Comments)    Reaction: unknown  . Latex Rash    Family History  Problem Relation Age of Onset  . Diabetes Mellitus II Father     Social History:  reports that  has never smoked. he has never used smokeless tobacco. He reports that he does not drink alcohol or use drugs.  ROS: A complete review of systems was performed.  All systems are negative except for pertinent findings as noted.  Physical Exam:  Vital signs in last 24 hours: Temp:  [98.1 F (36.7 C)-98.2 F (36.8 C)] 98.2 F (36.8 C) (01/30 1735) Pulse Rate:  [78-105] 86 (01/30 1735) Resp:  [15-16] 16 (01/30 1735) BP: (106-135)/(65-97) 117/80 (01/30 1735) SpO2:  [94 %-99 %] 99 % (01/30 1735) Weight:  [95 lb (43.1 kg)] 95 lb (43.1 kg) (01/30 1138) Constitutional:  Alert and oriented, No acute distress HEENT: [place] AT, moist mucus membranes.  Trachea  midline, no masses Cardiovascular: Regular rate and rhythm, no clubbing, cyanosis, or edema. Respiratory: Normal respiratory effort, no increased work of breathing.Marland Kitchen GI: Abdomen is soft, nontender, nondistended, no abdominal masses.  Urostomy in place in right lower quadrant, draining clear yellow urine with some mucus.  Colostomy in place in left lower quadrant with no output. GU: No CVA tenderness.  Skin: No rashes, bruises or suspicious lesions Neurologic/MSK: Grossly  intact. Lower extremity dysplasia and atrophy appreciated. Psychiatric: Normal mood and affect   Laboratory Data:  Recent Labs    11/02/17 1219  WBC 10.8*  HGB 14.7  HCT 45.9   Recent Labs    11/02/17 1219  NA 136  K 4.0  CL 96*  CO2 23  GLUCOSE 133*  BUN 55*  CREATININE 2.22*  CALCIUM 9.1   No results for input(s): LABPT, INR in the last 72 hours. No results for input(s): LABURIN in the last 72 hours. Results for orders placed or performed during the hospital encounter of 10/09/16  Urine culture     Status: Abnormal   Collection Time: 10/09/16  7:53 PM  Result Value Ref Range Status   Specimen Description URINE, RANDOM  Final   Special Requests Normal  Final   Culture MULTIPLE SPECIES PRESENT, SUGGEST RECOLLECTION (A)  Final   Report Status 10/11/2016 FINAL  Final  C difficile quick scan w PCR reflex     Status: None   Collection Time: 10/10/16  6:15 AM  Result Value Ref Range Status   C Diff antigen NEGATIVE NEGATIVE Final   C Diff toxin NEGATIVE NEGATIVE Final   C Diff interpretation No C. difficile detected.  Final  OVA + PARASITE EXAM     Status: None   Collection Time: 10/10/16  6:15 AM  Result Value Ref Range Status   OVA + PARASITE EXAM Final report  Final    Comment: (NOTE) These results were obtained using wet preparation(s) and trichrome stained smear. This test does not include testing for Cryptosporidium parvum, Cyclospora, or Microsporidia. Performed At: [facility] [place], [place] [id number] Elwanda Brooklyn R MD [id number]    Source of Sample STOOL  Final  Gastrointestinal Panel by PCR , Stool     Status: None   Collection Time: 10/10/16  6:15 AM  Result Value Ref Range Status   Campylobacter species NOT DETECTED NOT DETECTED Final   Plesimonas shigelloides NOT DETECTED NOT DETECTED Final   Salmonella species NOT DETECTED NOT DETECTED Final   Yersinia enterocolitica NOT DETECTED NOT DETECTED Final   Vibrio species  NOT DETECTED NOT DETECTED Final   Vibrio cholerae NOT DETECTED NOT DETECTED Final   Enteroaggregative E coli (EAEC) NOT DETECTED NOT DETECTED Final   Enteropathogenic E coli (EPEC) NOT DETECTED NOT DETECTED Final   Enterotoxigenic E coli (ETEC) NOT DETECTED NOT DETECTED Final   Shiga like toxin producing E coli (STEC) NOT DETECTED NOT DETECTED Final   Shigella/Enteroinvasive E coli (EIEC) NOT DETECTED NOT DETECTED Final   Cryptosporidium NOT DETECTED NOT DETECTED Final   Cyclospora cayetanensis NOT DETECTED NOT DETECTED Final   Entamoeba histolytica NOT DETECTED NOT DETECTED Final   Giardia lamblia NOT DETECTED NOT DETECTED Final   Adenovirus F40/41 NOT DETECTED NOT DETECTED Final   Astrovirus NOT DETECTED NOT DETECTED Final   Norovirus GI/GII NOT DETECTED NOT DETECTED Final   Rotavirus A NOT DETECTED NOT DETECTED Final   Sapovirus (I, II, IV, and V) NOT DETECTED NOT DETECTED Final  Radiologic Imaging: Ct Renal Stone Study  Result Date: 11/02/2017 CLINICAL DATA:  Vomiting and diarrhea since Monday, generalized abdominal cramping EXAM: CT ABDOMEN AND PELVIS WITHOUT CONTRAST TECHNIQUE: Multidetector CT imaging of the abdomen and pelvis was performed following the standard protocol without IV contrast. Sagittal and coronal MPR images reconstructed from axial data set. Oral contrast was not administered. COMPARISON:  05/07/2017 FINDINGS: Lower chest: Lung bases clear Hepatobiliary: Gallbladder and liver unremarkable Pancreas: Normal appearance Spleen: Small, otherwise unremarkable Adrenals/Urinary Tract: Adrenal glands normal appearance. Numerous large calculi within RIGHT kidney including a staghorn calculus at the upper to mid RIGHT kidney unchanged. Significant decrease in calculi within LEFT kidney since the previous exam though LEFT hydronephrosis persists. Bladder decompressed. Ostomy in RIGHT lower quadrant appears to be urostomy with the BILATERAL mid ureters deviating towards the ostomy  rather than towards the bladder. Stomach/Bowel: Markedly increased stool within the rectum and sigmoid colon with rectal wall thickening compatible with stercoral colitis. Additional ostomy in the LEFT lower quadrant, appears to be a sigmoid loop colostomy. Stomach is distended by fluid and air. Dilated proximal small bowel loops in the upper abdomen up to 5.6 cm diameter with decompressed distal small bowel loops consistent with small bowel obstruction. Question transition zone in the RIGHT mid abdomen. No definite small bowel wall thickening. Proximal, transverse and descending colon decompressed. Vascular/Lymphatic: Aorta normal caliber.  No definite adenopathy. Reproductive: N/A Other: No free air or free fluid.  No hernia. Musculoskeletal: Diffusely demineralized with thoracolumbar dysraphic is features and scoliosis. Degenerative changes of both hip joints much greater on LEFT, with LEFT hip joint effusion present. IMPRESSION: Dilated proximal and decompressed distal small bowel loops compatible with small bowel obstruction, with transition zone in the RIGHT mid abdomen. Double-barrel colostomy in the LEFT lower quadrant with markedly increased stool in the distal limb/rectosigmoid colon associated with rectal wall thickening consistent with stercoral colitis. Apparent urinary diversion in the RIGHT lower quadrant with persistent LEFT hydronephrosis and numerous BILATERAL renal calculi including staghorn calculi in the RIGHT kidney; significant interval decrease in stone burden within the LEFT kidney. Extensive spinal dysraphism consistent with spina bifida. Electronically Signed   By: Lavonia Dana M.D.   On: 11/02/2017 13:48   CT scan personally reviewed.  Impression/Assessment:  52 year old male with history of specimen test post ileal conduit, known history of bilateral nephrolithiasis who is admitted with possible small bowel obstruction, poor p.o. intake, nausea and vomiting.   Plan:  1. Possible  UTI-patient has an ileal conduit and urine was collected directly from the bag which is suboptimal.  He is already been started on antibiotics (cipro/ flagyl for bowel coverage).  Ideally, urine would be obtained via red rubber catheter through his ileal conduit for bladder specimen.  He is afebrile without a leukocytosis. - If he does decompensates, leukocytosis worsens, I would recommend broadening his antibiotics to cover think resistant enterococcus with lid nasally and given his history.  -F/u urine culture, suspect it will grow mixed flora consistent with chronic colonization   2. Nephrolithiasis-  significantly improved upper tract stone burden bilaterally.  No evidence of obstructing calculi.  Given the complexity of this patient and his established care with [facility], I would refer him back there for definitive management.    3. Chronic bilateral hydroureteronephrosis- secondary to refluxing anastomosis, previously evaluated with a loopogram confirming this.  Stable, unchanged.  4.  Acute on chronic renal insufficiency-suspect this is likely prerenal with an elevated BUN/creatinine ratio and poor intake.  Evaluate tomorrow  with repeat labs to assess for improvement.  Do not suspect obstruction as underlying cause.  11/02/2017, 9:24 PM  Hollice Espy,  MD

## 2017-11-02 NOTE — ED Provider Notes (Signed)
[facility] Emergency Department Provider Note  ___________________________________________   First MD Initiated Contact with Patient 11/02/17 1146     (approximate)  I have reviewed the triage vital signs and the nursing notes.   HISTORY  Chief Complaint Abdominal Pain and Emesis   HPI [name] is a 52 y.o. male with a history of spina bifida, colostomy as well as nephrostomy secondary to kidney stones was presenting with diffuse abdominal pain as well as nausea vomiting and diarrhea.  He says that the pain is been intermittent and is an issue that he has been "dealing with my whole life."  He says that he has "not vomited much today" but is unable to give a quantity.  Also says that he, despite having a colostomy, still has stool from his rectum.  Denies any blood in the vomitus in the stool.  Is unable to quantify out of 10 how great the pain is or what quality it is.  Denies any radiation of the pain.   Past Medical History:  Diagnosis Date  . Decubitus ulcer   . Renal disorder   . Spina bifida [facility])     Patient Active Problem List   Diagnosis Date Noted  . Abdominal distention 10/11/2016  . 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 Hypokalemia 10/11/2016  . Leukocytosis 10/11/2016  . Loose stools 10/11/2016  . Fecal impaction in rectum ([place]) 10/10/2016  . Nausea & vomiting 06/03/2016  . Gastric outlet obstruction 06/03/2016  . SIRS (systemic inflammatory response syndrome) ([place]) 06/03/2016  . Pressure ulcer 06/03/2016  . Impaction of the bowels ([place])   . Gastritis   . Ulcer of esophagus with bleeding   . Nausea with vomiting   . Nausea vomiting and diarrhea   . Sepsis ([place]) 04/21/2016    Past Surgical History:  Procedure Laterality Date  . ABDOMINAL SURGERY    . BACK SURGERY    . COLON SURGERY    . ESOPHAGOGASTRODUODENOSCOPY (EGD) WITH PROPOFOL N/A 06/03/2016   Procedure: ESOPHAGOGASTRODUODENOSCOPY (EGD) WITH  PROPOFOL;  Surgeon: [name], MD;  Location: [facility] ENDOSCOPY;  Service: Endoscopy;  Laterality: N/A;  . ilieostomy    . VENTRICULOPERITONEAL SHUNT    . vp shunt removal      Prior to Admission medications   Medication Sig Start Date End Date Taking? Authorizing Provider  feeding supplement, ENSURE ENLIVE, (ENSURE ENLIVE) LIQD Take 237 mLs by mouth 2 (two) times daily between meals. Patient not taking: Reported on 05/07/2017 04/26/16   Fritzi Mandes, MD  Multiple Vitamins-Minerals (CENTRUM MEN PO) Take by mouth.    [provider]  polyethylene glycol (MIRALAX / GLYCOLAX) packet Take 17 g by mouth 2 (two) times daily. 10/11/16   Theodoro Grist, MD    Allergies Ivp dye [iodinated diagnostic agents]; Morphine and related; Sulfa antibiotics; and Latex  Family History  Problem Relation Age of Onset  . Diabetes Mellitus II Father     Social History Social History   Tobacco Use  . Smoking status: Never Smoker  . Smokeless tobacco: Never Used  Substance Use Topics  . Alcohol use: No  . Drug use: No    Review of Systems  Constitutional: No fever/chills Eyes: No visual changes. ENT: No sore throat. Cardiovascular: Denies chest pain. Respiratory: Denies shortness of breath. Gastrointestinal: No constipation. Genitourinary: Negative for dysuria. Musculoskeletal: Negative for back pain. Skin: Negative for rash. Neurological: Negative for headaches, focal weakness or numbness.   ____________________________________________   PHYSICAL EXAM:  VITAL SIGNS: ED Triage Vitals  Enc Vitals Group     BP 11/02/17 1139 109/81     Pulse Rate 11/02/17 1139 (!) 105     Resp 11/02/17 1139 15     Temp 11/02/17 1139 98.1 F (36.7 C)     Temp Source 11/02/17 1139 Axillary     SpO2 11/02/17 1139 98 %     Weight 11/02/17 1138 95 lb (43.1 kg)     Height 11/02/17 1138 5\' 3"  (1.6 m)     Head Circumference --      Peak Flow --      Pain Score 11/02/17 1137 5     Pain Loc --       Pain Edu? --      Excl. in [place]? --     Constitutional: Alert and oriented. Well appearing and in no acute distress. Eyes: Corneal scarring to the left eye which appears chronic. Head: Atraumatic. Nose: No congestion/rhinnorhea. Mouth/Throat: Mucous membranes are moist.  Neck: No stridor.   Cardiovascular: Normal rate, regular rhythm. Grossly normal heart sounds.   Respiratory: Normal respiratory effort.  No retractions. Lungs CTAB. Gastrointestinal: Soft and nontender. No distention. No CVA tenderness.  Patient with urostomy in the right lower quadrant as well as colostomy to left upper quadrant.  No bloody stool in the colostomy.  No stool in the colostomy at this time.  Yellow, cloudy urine in the urostomy. Musculoskeletal: No lower extremity tenderness nor edema.  No joint effusions. Neurologic:  Normal speech and language. No gross focal neurologic deficits are appreciated. Skin:  Skin is warm, dry and intact. No rash noted. Psychiatric: Mood and affect are normal. Speech and behavior are normal.  ____________________________________________   LABS (all labs ordered are listed, but only abnormal results are displayed)  Labs Reviewed  URINALYSIS, COMPLETE (UACMP) WITH MICROSCOPIC - Abnormal; Notable for the following components:      Result Value   Color, Urine YELLOW (*)    APPearance TURBID (*)    Hgb urine dipstick SMALL (*)    Ketones, ur 5 (*)    Protein, ur >=300 (*)    Leukocytes, UA MODERATE (*)    Bacteria, UA MANY (*)    Squamous Epithelial / LPF 6-30 (*)    All other components within normal limits  LIPASE, BLOOD  COMPREHENSIVE METABOLIC PANEL  CBC   ____________________________________________  EKG   ____________________________________________  RADIOLOGY  CAT scan with dilated proximal and decompressed distal small bowel loops compatible with small bowel obstruction.  Double barrel colostomy in the left lower quadrant with markedly increased stool in the  distal limb/rectosigmoid colon associated with rectal wall thickening consistent with stercoral colitis. ____________________________________________   PROCEDURES  Procedure(s) performed:   Procedures   Critical Care performed:   ____________________________________________   INITIAL IMPRESSION / ASSESSMENT AND PLAN / ED COURSE  Pertinent labs & imaging results that were available during my care of the patient were reviewed by me and considered in my medical decision making (see chart for details).  Differential diagnosis includes, but is not limited to, acute appendicitis, renal colic, testicular torsion, urinary tract infection/pyelonephritis, prostatitis,  epididymitis, diverticulitis, small bowel obstruction or ileus, colitis, abdominal aortic aneurysm, gastroenteritis, hernia, etc. As part of my medical decision making, I reviewed the following data within the [place] chart reviewed   ----------------------------------------- 2:12 PM on 11/02/2017 -----------------------------------------  Patient at this time without any further complaints.  Had a reassuring abdominal exam.  Discussed  the findings on the CT with the surgeon, Dr. Adora Fridge, who believes that this is more likely an ileus secondary to patient not being tender.  He recommends admission to the medicine service.  Signed out to Dr. Anselm Jungling.  Patient aware of eating admission and willing to comply.  No obvious infectious source.  Will send urine for urine culture.  Findings in the urine likely consistent with patient's urostomy.  Will likely need bowel regimen for large amount of stool in the bowel.  Patient with elevated white blood cell count however is 95 bpm heart rate in the room.  We will not call sepsis alert.  Elevated white blood cell count appears somewhat chronic and likely at this point related to either being persistently elevated versus inflammatory response from the patient's ileus.      ____________________________________________   FINAL CLINICAL IMPRESSION(S) / ED DIAGNOSES  Constipation.  Small bowel obstruction.  Acute renal failure.    NEW MEDICATIONS STARTED DURING THIS VISIT:  New Prescriptions   No medications on file     Note:  This document was prepared using Dragon voice recognition software and may include unintentional dictation errors.     Orbie Pyo, MD 11/02/17 1414

## 2017-11-02 NOTE — Progress Notes (Signed)
Pt having BM through rectum   not colostomy. Pt feeling nauseated Given zofran once. Pt had one episode of small amount of emesis. Primary nurse paged and spoke to Dr. Duane Boston. Orders received for Phenergan 12.5 q 6 hr PRN. Primary nurse to continue to monitor

## 2017-11-02 NOTE — ED Notes (Signed)
Was called by charge nurse on floor and advised that pt room assignment was changing and that they would call when the new room was clean and they were ready for report

## 2017-11-02 NOTE — ED Notes (Signed)
IV team started IV and drew blood

## 2017-11-02 NOTE — ED Notes (Signed)
IV team consult placed for IV and blood draw

## 2017-11-02 NOTE — ED Triage Notes (Signed)
Pt arrived via ems for c/o vomiting and diarrhea since Monday (pt refuses to answer the number of times in the last 24 hours) - c/o generalized stomach cramping

## 2017-11-03 ENCOUNTER — Other Ambulatory Visit: Payer: Self-pay

## 2017-11-03 ENCOUNTER — Inpatient Hospital Stay: Payer: Medicare Other

## 2017-11-03 LAB — BASIC METABOLIC PANEL
Anion gap: 11 (ref 5–15)
BUN: 42 mg/dL — AB (ref 6–20)
CHLORIDE: 111 mmol/L (ref 101–111)
CO2: 23 mmol/L (ref 22–32)
CREATININE: 1.44 mg/dL — AB (ref 0.61–1.24)
Calcium: 8 mg/dL — ABNORMAL LOW (ref 8.9–10.3)
GFR calc Af Amer: >60 mL/min (ref >60)
GFR calc non Af Amer: 55 mL/min — ABNORMAL LOW (ref >60)
GLUCOSE: 88 mg/dL (ref 65–99)
POTASSIUM: 3.5 mmol/L (ref 3.5–5.1)
Sodium: 145 mmol/L (ref 135–145)

## 2017-11-03 LAB — CBC
HEMATOCRIT: 36.5 % — AB (ref 40.0–52.0)
Hemoglobin: 11.6 g/dL — ABNORMAL LOW (ref 13.0–18.0)
MCH: 26.8 pg (ref 26.0–34.0)
MCHC: 31.9 g/dL — AB (ref 32.0–36.0)
MCV: 84.1 fL (ref 80.0–100.0)
PLATELETS: 302 10*3/uL (ref 150–440)
RBC: 4.34 MIL/uL — ABNORMAL LOW (ref 4.40–5.90)
RDW: 16.8 % — AB (ref 11.5–14.5)
WBC: 5 10*3/uL (ref 3.8–10.6)

## 2017-11-03 MED ORDER — CIPROFLOXACIN IN D5W 400 MG/200ML IV SOLN
400.0000 mg | Freq: Two times a day (BID) | INTRAVENOUS | Status: DC
  Administered 2017-11-03 – 2017-11-04 (×3): 400 mg via INTRAVENOUS
  Filled 2017-11-03 (×5): qty 200

## 2017-11-03 MED ORDER — SODIUM CHLORIDE 0.9 % IV BOLUS (SEPSIS)
1000.0000 mL | Freq: Once | INTRAVENOUS | Status: AC
  Administered 2017-11-03: 1000 mL via INTRAVENOUS

## 2017-11-03 NOTE — Progress Notes (Signed)
CC: ileus Subjective: kub reviewed , resolution of ileus, pain imrpoving + bm Objective: Vital signs in last 24 hours: Temp:  [98 F (36.7 C)-98.9 F (37.2 C)] 98 F (36.7 C) (01/31 1211) Pulse Rate:  [64-105] 64 (01/31 1211) Resp:  [16-18] 18 (01/31 1211) BP: (106-135)/(65-97) 114/69 (01/31 1211) SpO2:  [95 %-100 %] 100 % (01/31 1211) Last BM Date: 11/02/17  Intake/Output from previous day: 01/30 0701 - 01/31 0700 In: 2320 [I.V.:1120; IV Piggyback:1200] Out: 775 [Urine:775] Intake/Output this shift: No intake/output data recorded.  Physical exam: Debilitated, NAD Abd: soft, mild TTP LLQ, fecal matter felt. No peritonitis  Lab Results: CBC  Recent Labs    11/02/17 1219 11/03/17 0409  WBC 10.8* 5.0  HGB 14.7 11.6*  HCT 45.9 36.5*  PLT 411 302   BMET Recent Labs    11/02/17 1219 11/03/17 0409  NA 136 145  K 4.0 3.5  CL 96* 111  CO2 23 23  GLUCOSE 133* 88  BUN 55* 42*  CREATININE 2.22* 1.44*  CALCIUM 9.1 8.0*   PT/INR No results for input(s): LABPROT, INR in the last 72 hours. ABG No results for input(s): PHART, HCO3 in the last 72 hours.  Invalid input(s): PCO2, PO2  Studies/Results: Dg Abd Acute W/chest  Result Date: 11/03/2017 CLINICAL DATA:  Abdominal pain.  Question small bowel obstruction. EXAM: DG ABDOMEN ACUTE W/ 1V CHEST COMPARISON:  CT 11/02/2017 FINDINGS: On today's study, there are no longer any identifiable dilated fluid and air-filled loops of small intestine. Massive amount of stool remains evident in the left colon and rectum. No sign of free air. Extensive renal parenchymal calcifications remain visible, right more than left. Chronic spina bifida. No active cardiopulmonary disease. Tortuous aorta and spinal curvature. IMPRESSION: There is no longer any dilated small intestine visible. Very large amount of fecal matter remains evident in the left colon and rectum. No free air. No chest disease. Electronically Signed   By: Nelson Chimes M.D.    On: 11/03/2017 08:58   Ct Renal Stone Study  Result Date: 11/02/2017 CLINICAL DATA:  Vomiting and diarrhea since Monday, generalized abdominal cramping EXAM: CT ABDOMEN AND PELVIS WITHOUT CONTRAST TECHNIQUE: Multidetector CT imaging of the abdomen and pelvis was performed following the standard protocol without IV contrast. Sagittal and coronal MPR images reconstructed from axial data set. Oral contrast was not administered. COMPARISON:  05/07/2017 FINDINGS: Lower chest: Lung bases clear Hepatobiliary: Gallbladder and liver unremarkable Pancreas: Normal appearance Spleen: Small, otherwise unremarkable Adrenals/Urinary Tract: Adrenal glands normal appearance. Numerous large calculi within RIGHT kidney including a staghorn calculus at the upper to mid RIGHT kidney unchanged. Significant decrease in calculi within LEFT kidney since the previous exam though LEFT hydronephrosis persists. Bladder decompressed. Ostomy in RIGHT lower quadrant appears to be urostomy with the BILATERAL mid ureters deviating towards the ostomy rather than towards the bladder. Stomach/Bowel: Markedly increased stool within the rectum and sigmoid colon with rectal wall thickening compatible with stercoral colitis. Additional ostomy in the LEFT lower quadrant, appears to be a sigmoid loop colostomy. Stomach is distended by fluid and air. Dilated proximal small bowel loops in the upper abdomen up to 5.6 cm diameter with decompressed distal small bowel loops consistent with small bowel obstruction. Question transition zone in the RIGHT mid abdomen. No definite small bowel wall thickening. Proximal, transverse and descending colon decompressed. Vascular/Lymphatic: Aorta normal caliber.  No definite adenopathy. Reproductive: N/A Other: No free air or free fluid.  No hernia. Musculoskeletal: Diffusely demineralized with thoracolumbar dysraphic  is features and scoliosis. Degenerative changes of both hip joints much greater on LEFT, with LEFT hip  joint effusion present. IMPRESSION: Dilated proximal and decompressed distal small bowel loops compatible with small bowel obstruction, with transition zone in the RIGHT mid abdomen. Double-barrel colostomy in the LEFT lower quadrant with markedly increased stool in the distal limb/rectosigmoid colon associated with rectal wall thickening consistent with stercoral colitis. Apparent urinary diversion in the RIGHT lower quadrant with persistent LEFT hydronephrosis and numerous BILATERAL renal calculi including staghorn calculi in the RIGHT kidney; significant interval decrease in stone burden within the LEFT kidney. Extensive spinal dysraphism consistent with spina bifida. Electronically Signed   By: Lavonia Dana M.D.   On: 11/02/2017 13:48    Anti-infectives: Anti-infectives (From admission, onward)   Start     Dose/Rate Route Frequency Ordered Stop   11/04/17 1800  ciprofloxacin (CIPRO) IVPB 400 mg  Status:  Discontinued     400 mg 200 mL/hr over 60 Minutes Intravenous Every 24 hours 11/02/17 2241 11/03/17 0950   11/03/17 1100  ciprofloxacin (CIPRO) IVPB 400 mg     400 mg 200 mL/hr over 60 Minutes Intravenous Every 12 hours 11/03/17 0950     11/02/17 1830  ciprofloxacin (CIPRO) IVPB 400 mg  Status:  Discontinued     400 mg 200 mL/hr over 60 Minutes Intravenous Every 12 hours 11/02/17 1730 11/02/17 2241   11/02/17 1830  metroNIDAZOLE (FLAGYL) IVPB 500 mg     500 mg 100 mL/hr over 60 Minutes Intravenous Every 6 hours 11/02/17 1730        Assessment/Plan: Resolving ileus May advance to clears No surgical intervention  Caroleen Hamman, MD, FACS  11/03/2017

## 2017-11-03 NOTE — Progress Notes (Signed)
[name]                                                                                                                                                                                  Patient Demographics   [name], is a 52 y.o. male, DOB - May 10, 1966, KGY:[id number]  Admit date - 11/02/2017   Admitting Physician Demetrios Loll, MD  Outpatient Primary MD for the patient is Mintzer, Threasa Beards, MD   LOS - 1  Subjective: Fitted with small bowel obstruction x-ray shows much improvement    Review of Systems:   CONSTITUTIONAL: No documented fever. No fatigue, weakness. No weight gain, no weight loss.  EYES: No blurry or double vision.  ENT: No tinnitus. No postnasal drip. No redness of the oropharynx.  RESPIRATORY: No cough, no wheeze, no hemoptysis. No dyspnea.  CARDIOVASCULAR: No chest pain. No orthopnea. No palpitations. No syncope.  GASTROINTESTINAL: Complains of nausea and some abdominal discomfort GENITOURINARY: No dysuria or hematuria.  ENDOCRINE: No polyuria or nocturia. No heat or cold intolerance.  HEMATOLOGY: No anemia. No bruising. No bleeding.  INTEGUMENTARY: No rashes. No lesions.  MUSCULOSKELETAL: No arthritis. No swelling. No gout.  NEUROLOGIC: No numbness, tingling, or ataxia. No seizure-type activity.  PSYCHIATRIC: No anxiety. No insomnia. No ADD.    Vitals:   Vitals:   11/02/17 2125 11/02/17 2132 11/03/17 0456 11/03/17 1211  BP: 112/72  119/71 114/69  Pulse: 82 79 76 64  Resp: 18  16 18   Temp:  98.9 F (37.2 C) 98.2 F (36.8 C) 98 F (36.7 C)  TempSrc:   Axillary   SpO2: 100% 100% 98% 100%  Weight:      Height:        Wt Readings from Last 3 Encounters:  11/02/17 95 lb (43.1 kg)  05/07/17 92 lb (41.7 kg)  10/10/16 92 lb 9.6 oz (42 kg)     Intake/Output Summary (Last 24 hours) at 11/03/2017 1511 Last data filed at 11/03/2017 1424 Gross per 24 hour  Intake 3522 ml  Output 1275 ml  Net 2247 ml     Physical Exam:   GENERAL: Pleasant-appearing in no apparent distress.  HEAD, EYES, EARS, NOSE AND THROAT: Atraumatic, normocephalic. Extraocular muscles are intact. Pupils equal and reactive to light. Sclerae anicteric. No conjunctival injection. No oro-pharyngeal erythema.  NECK: Supple. There is no jugular venous distention. No bruits, no lymphadenopathy, no thyromegaly.  HEART: Regular rate and rhythm,. No murmurs, no rubs, no clicks.  LUNGS: Clear to auscultation bilaterally. No rales or rhonchi. No wheezes.  ABDOMEN: Soft, positive bowel sounds x4 EXTREMITIES: Spina bifida NEUROLOGIC: The patient is alert, awake, and oriented  x3 with no focal motor or sensory deficits appreciated bilaterally.  SKIN: Moist and warm with no rashes appreciated.  Psych: Not anxious, depressed LN: No inguinal LN enlargement    Antibiotics   Anti-infectives (From admission, onward)   Start     Dose/Rate Route Frequency Ordered Stop   11/04/17 1800  ciprofloxacin (CIPRO) IVPB 400 mg  Status:  Discontinued     400 mg 200 mL/hr over 60 Minutes Intravenous Every 24 hours 11/02/17 2241 11/03/17 0950   11/03/17 1100  ciprofloxacin (CIPRO) IVPB 400 mg     400 mg 200 mL/hr over 60 Minutes Intravenous Every 12 hours 11/03/17 0950     11/02/17 1830  ciprofloxacin (CIPRO) IVPB 400 mg  Status:  Discontinued     400 mg 200 mL/hr over 60 Minutes Intravenous Every 12 hours 11/02/17 1730 11/02/17 2241   11/02/17 1830  metroNIDAZOLE (FLAGYL) IVPB 500 mg     500 mg 100 mL/hr over 60 Minutes Intravenous Every 6 hours 11/02/17 1730        Medications   Scheduled Meds: . heparin  5,000 Units Subcutaneous Q8H   Continuous Infusions: . sodium chloride 100 mL/hr at 11/03/17 0458  . ciprofloxacin Stopped (11/03/17 1306)  . metronidazole 500 mg (11/03/17 1324)   PRN Meds:.acetaminophen **OR** acetaminophen, albuterol, bisacodyl, ondansetron **OR** ondansetron (ZOFRAN) IV, promethazine, senna-docusate   Data  Review:   Micro Results No results found for this or any previous visit (from the past 240 hour(s)).  Radiology Reports Dg Abd Acute W/chest  Result Date: 11/03/2017 CLINICAL DATA:  Abdominal pain.  Question small bowel obstruction. EXAM: DG ABDOMEN ACUTE W/ 1V CHEST COMPARISON:  CT 11/02/2017 FINDINGS: On today's study, there are no longer any identifiable dilated fluid and air-filled loops of small intestine. Massive amount of stool remains evident in the left colon and rectum. No sign of free air. Extensive renal parenchymal calcifications remain visible, right more than left. Chronic spina bifida. No active cardiopulmonary disease. Tortuous aorta and spinal curvature. IMPRESSION: There is no longer any dilated small intestine visible. Very large amount of fecal matter remains evident in the left colon and rectum. No free air. No chest disease. Electronically Signed   By: Nelson Chimes M.D.   On: 11/03/2017 08:58   Ct Renal Stone Study  Result Date: 11/02/2017 CLINICAL DATA:  Vomiting and diarrhea since Monday, generalized abdominal cramping EXAM: CT ABDOMEN AND PELVIS WITHOUT CONTRAST TECHNIQUE: Multidetector CT imaging of the abdomen and pelvis was performed following the standard protocol without IV contrast. Sagittal and coronal MPR images reconstructed from axial data set. Oral contrast was not administered. COMPARISON:  05/07/2017 FINDINGS: Lower chest: Lung bases clear Hepatobiliary: Gallbladder and liver unremarkable Pancreas: Normal appearance Spleen: Small, otherwise unremarkable Adrenals/Urinary Tract: Adrenal glands normal appearance. Numerous large calculi within RIGHT kidney including a staghorn calculus at the upper to mid RIGHT kidney unchanged. Significant decrease in calculi within LEFT kidney since the previous exam though LEFT hydronephrosis persists. Bladder decompressed. Ostomy in RIGHT lower quadrant appears to be urostomy with the BILATERAL mid ureters deviating towards the  ostomy rather than towards the bladder. Stomach/Bowel: Markedly increased stool within the rectum and sigmoid colon with rectal wall thickening compatible with stercoral colitis. Additional ostomy in the LEFT lower quadrant, appears to be a sigmoid loop colostomy. Stomach is distended by fluid and air. Dilated proximal small bowel loops in the upper abdomen up to 5.6 cm diameter with decompressed distal small bowel loops consistent with small bowel obstruction. Question  transition zone in the RIGHT mid abdomen. No definite small bowel wall thickening. Proximal, transverse and descending colon decompressed. Vascular/Lymphatic: Aorta normal caliber.  No definite adenopathy. Reproductive: N/A Other: No free air or free fluid.  No hernia. Musculoskeletal: Diffusely demineralized with thoracolumbar dysraphic is features and scoliosis. Degenerative changes of both hip joints much greater on LEFT, with LEFT hip joint effusion present. IMPRESSION: Dilated proximal and decompressed distal small bowel loops compatible with small bowel obstruction, with transition zone in the RIGHT mid abdomen. Double-barrel colostomy in the LEFT lower quadrant with markedly increased stool in the distal limb/rectosigmoid colon associated with rectal wall thickening consistent with stercoral colitis. Apparent urinary diversion in the RIGHT lower quadrant with persistent LEFT hydronephrosis and numerous BILATERAL renal calculi including staghorn calculi in the RIGHT kidney; significant interval decrease in stone burden within the LEFT kidney. Extensive spinal dysraphism consistent with spina bifida. Electronically Signed   By: Lavonia Dana M.D.   On: 11/02/2017 13:48     CBC Recent Labs  Lab 11/02/17 1219 11/03/17 0409  WBC 10.8* 5.0  HGB 14.7 11.6*  HCT 45.9 36.5*  PLT 411 302  MCV 83.1 84.1  MCH 26.5 26.8  MCHC 31.9* 31.9*  RDW 16.8* 16.8*    Chemistries  Recent Labs  Lab 11/02/17 1219 11/03/17 0409  NA 136 145  K 4.0  3.5  CL 96* 111  CO2 23 23  GLUCOSE 133* 88  BUN 55* 42*  CREATININE 2.22* 1.44*  CALCIUM 9.1 8.0*  AST 30  --   ALT 16*  --   ALKPHOS 97  --   BILITOT 0.9  --    ------------------------------------------------------------------------------------------------------------------ estimated creatinine clearance is 37.4 mL/min (A) (by C-G formula based on SCr of 1.44 mg/dL (H)). ------------------------------------------------------------------------------------------------------------------ No results for input(s): HGBA1C in the last 72 hours. ------------------------------------------------------------------------------------------------------------------ No results for input(s): CHOL, HDL, LDLCALC, TRIG, CHOLHDL, LDLDIRECT in the last 72 hours. ------------------------------------------------------------------------------------------------------------------ No results for input(s): TSH, T4TOTAL, T3FREE, THYROIDAB in the last 72 hours.  Invalid input(s): FREET3 ------------------------------------------------------------------------------------------------------------------ No results for input(s): VITAMINB12, FOLATE, FERRITIN, TIBC, IRON, RETICCTPCT in the last 72 hours.  Coagulation profile No results for input(s): INR, PROTIME in the last 168 hours.  No results for input(s): DDIMER in the last 72 hours.  Cardiac Enzymes No results for input(s): CKMB, TROPONINI, MYOGLOBIN in the last 168 hours.  Invalid input(s): CK ------------------------------------------------------------------------------------------------------------------ Invalid input(s): [place]  Patient is a 52 year old with small bowel obstruction  1. Small bowel obstruction. Patient small bowel obstruction seems to have resolvedAcute stercoral colitis.  Start Cipro and Flagyl IV, follow-up CBC.  2 UTI per urinalysis.  Continue Cipro  Await urine culture cx  3. Acute renal failure,  possible due to dehydration Improved with ivf  4. LEFT hydronephrosis and numerous BILATERAL renal calculi Appears to be chronic seen by urology no plan for any intervention  5. spina bifida.       Code Status Orders  (From admission, onward)        Start     Ordered   11/02/17 1731  Full code  Continuous     11/02/17 1730    Code Status History    Date Active Date Inactive Code Status Order ID Comments User Context   10/10/2016 02:05 10/11/2016 21:24 Full Code [id number]  Harvie Bridge, DO Inpatient   06/03/2016 04:29 06/08/2016 19:14 Full Code [id number]  Saundra Shelling, MD Inpatient   04/21/2016 02:38 04/26/2016 18:15 Full Code [id number]  Marcille Blanco,  Norva Riffle, MD Inpatient           Consults surgery urology  DVT Prophylaxis  Lovenox   Lab Results  Component Value Date   PLT 302 11/03/2017     Time Spent in minutes 35 minutes greater than 50% of time spent in care coordination and counseling patient regarding the condition and plan of care.   Dustin Flock M.D on 11/03/2017 at 3:11 PM  Between 7am to 6pm - Pager - [phone]  After 6pm go to [email or web address] - password [email or web address] [place] [facility]   Office  [phone]

## 2017-11-03 NOTE — Progress Notes (Signed)
Patient seen late yesterday evening in consultation.  Please see previous note.  Labs reviewed this morning, creatinine back near baseline with IV hydration.  Suspect prerenal cause.  No further urological intervention.  Follow-up urine culture although clinically do not suspect UTI, rather chronic colonization and poor collection technique (direct from old bag).  Urology will sign off, please page [place] with any questions or concerns or changes in status.  Hollice Espy, MD

## 2017-11-03 NOTE — Progress Notes (Signed)
Pharmacist-Provider Communication:  Given improvement in renal function, order for ciprofloxacin changed to 400 mg IV q12h. Per protocol.  Lenis Noon, PharmD, BCPS Clinical Pharmacist 11/03/17 9:51 AM

## 2017-11-04 LAB — CBC
HCT: 32.6 % — ABNORMAL LOW (ref 40.0–52.0)
Hemoglobin: 10.2 g/dL — ABNORMAL LOW (ref 13.0–18.0)
MCH: 26.4 pg (ref 26.0–34.0)
MCHC: 31.2 g/dL — ABNORMAL LOW (ref 32.0–36.0)
MCV: 84.8 fL (ref 80.0–100.0)
PLATELETS: 269 10*3/uL (ref 150–440)
RBC: 3.85 MIL/uL — ABNORMAL LOW (ref 4.40–5.90)
RDW: 16.5 % — AB (ref 11.5–14.5)
WBC: 4.9 10*3/uL (ref 3.8–10.6)

## 2017-11-04 LAB — URINE CULTURE

## 2017-11-04 LAB — BASIC METABOLIC PANEL
Anion gap: 12 (ref 5–15)
BUN: 22 mg/dL — AB (ref 6–20)
CHLORIDE: 116 mmol/L — AB (ref 101–111)
CO2: 19 mmol/L — ABNORMAL LOW (ref 22–32)
CREATININE: 1.06 mg/dL (ref 0.61–1.24)
Calcium: 8.2 mg/dL — ABNORMAL LOW (ref 8.9–10.3)
GFR calc Af Amer: >60 mL/min (ref >60)
GFR calc non Af Amer: >60 mL/min (ref >60)
Glucose, Bld: 67 mg/dL (ref 65–99)
Potassium: 3.1 mmol/L — ABNORMAL LOW (ref 3.5–5.1)
SODIUM: 147 mmol/L — AB (ref 135–145)

## 2017-11-04 LAB — HIV ANTIBODY (ROUTINE TESTING W REFLEX): HIV Screen 4th Generation wRfx: NONREACTIVE

## 2017-11-04 MED ORDER — ADULT MULTIVITAMIN W/MINERALS CH
1.0000 | ORAL_TABLET | Freq: Every day | ORAL | Status: DC
  Administered 2017-11-04 – 2017-11-05 (×2): 1 via ORAL
  Filled 2017-11-04 (×2): qty 1

## 2017-11-04 MED ORDER — CIPROFLOXACIN HCL 500 MG PO TABS
500.0000 mg | ORAL_TABLET | Freq: Two times a day (BID) | ORAL | Status: DC
  Administered 2017-11-04 – 2017-11-05 (×2): 500 mg via ORAL
  Filled 2017-11-04 (×2): qty 1

## 2017-11-04 MED ORDER — PREMIER PROTEIN SHAKE
11.0000 [oz_av] | Freq: Two times a day (BID) | ORAL | Status: DC
  Administered 2017-11-04 – 2017-11-05 (×2): 11 [oz_av] via ORAL

## 2017-11-04 MED ORDER — POTASSIUM CHLORIDE CRYS ER 20 MEQ PO TBCR
40.0000 meq | EXTENDED_RELEASE_TABLET | Freq: Once | ORAL | Status: AC
Start: 2017-11-04 — End: 2017-11-04
  Administered 2017-11-04: 40 meq via ORAL
  Filled 2017-11-04: qty 2

## 2017-11-04 NOTE — Progress Notes (Signed)
[place] at [facility]                                                                                                                                                                                  Patient Demographics   [name], is a 52 y.o. male, DOB - May 17, 1966, TOI:[id number]  Admit date - 11/02/2017   Admitting Physician Demetrios Loll, MD  Outpatient Primary MD for the patient is Mintzer, Threasa Beards, MD   LOS - 2  Subjective: Patient feeling better abdominal pain resolved tolerating diet    Review of Systems:   CONSTITUTIONAL: No documented fever. No fatigue, weakness. No weight gain, no weight loss.  EYES: No blurry or double vision.  ENT: No tinnitus. No postnasal drip. No redness of the oropharynx.  RESPIRATORY: No cough, no wheeze, no hemoptysis. No dyspnea.  CARDIOVASCULAR: No chest pain. No orthopnea. No palpitations. No syncope.  GASTROINTESTINAL: Complains of nausea and some abdominal discomfort GENITOURINARY: No dysuria or hematuria.  ENDOCRINE: No polyuria or nocturia. No heat or cold intolerance.  HEMATOLOGY: No anemia. No bruising. No bleeding.  INTEGUMENTARY: No rashes. No lesions.  MUSCULOSKELETAL: No arthritis. No swelling. No gout.  NEUROLOGIC: No numbness, tingling, or ataxia. No seizure-type activity.  PSYCHIATRIC: No anxiety. No insomnia. No ADD.    Vitals:   Vitals:   11/03/17 1211 11/03/17 1927 11/04/17 0451 11/04/17 1153  BP: 114/69 123/66 106/62 123/73  Pulse: 64 (!) 57 64 63  Resp: 18 16 16  (!) 21  Temp: 98 F (36.7 C) (!) 97.5 F (36.4 C) (!) 97.4 F (36.3 C) 98.3 F (36.8 C)  TempSrc:  Axillary Oral Oral  SpO2: 100% 100% 99% 100%  Weight:      Height:        Wt Readings from Last 3 Encounters:  11/02/17 95 lb (43.1 kg)  05/07/17 92 lb (41.7 kg)  10/10/16 92 lb 9.6 oz (42 kg)     Intake/Output Summary (Last 24 hours) at 11/04/2017 1637 Last data filed at 11/04/2017 1617 Gross per 24 hour  Intake  3615 ml  Output 1975 ml  Net 1640 ml    Physical Exam:   GENERAL: Pleasant-appearing in no apparent distress.  HEAD, EYES, EARS, NOSE AND THROAT: Atraumatic, normocephalic. Extraocular muscles are intact. Pupils equal and reactive to light. Sclerae anicteric. No conjunctival injection. No oro-pharyngeal erythema.  NECK: Supple. There is no jugular venous distention. No bruits, no lymphadenopathy, no thyromegaly.  HEART: Regular rate and rhythm,. No murmurs, no rubs, no clicks.  LUNGS: Clear to auscultation bilaterally. No rales or rhonchi. No wheezes.  ABDOMEN: Soft, positive bowel sounds x4 EXTREMITIES: Spina bifida NEUROLOGIC: The  patient is alert, awake, and oriented x3 with no focal motor or sensory deficits appreciated bilaterally.  SKIN: Moist and warm with no rashes appreciated.  Psych: Not anxious, depressed LN: No inguinal LN enlargement    Antibiotics   Anti-infectives (From admission, onward)   Start     Dose/Rate Route Frequency Ordered Stop   11/04/17 1800  ciprofloxacin (CIPRO) IVPB 400 mg  Status:  Discontinued     400 mg 200 mL/hr over 60 Minutes Intravenous Every 24 hours 11/02/17 2241 11/03/17 0950   11/03/17 1100  ciprofloxacin (CIPRO) IVPB 400 mg     400 mg 200 mL/hr over 60 Minutes Intravenous Every 12 hours 11/03/17 0950     11/02/17 1830  ciprofloxacin (CIPRO) IVPB 400 mg  Status:  Discontinued     400 mg 200 mL/hr over 60 Minutes Intravenous Every 12 hours 11/02/17 1730 11/02/17 2241   11/02/17 1830  metroNIDAZOLE (FLAGYL) IVPB 500 mg     500 mg 100 mL/hr over 60 Minutes Intravenous Every 6 hours 11/02/17 1730        Medications   Scheduled Meds: . heparin  5,000 Units Subcutaneous Q8H  . multivitamin with minerals  1 tablet Oral Daily  . protein supplement shake  11 oz Oral BID BM   Continuous Infusions: . sodium chloride 100 mL/hr at 11/04/17 0847  . ciprofloxacin Stopped (11/04/17 1300)  . metronidazole Stopped (11/04/17 1544)   PRN  Meds:.acetaminophen **OR** acetaminophen, albuterol, bisacodyl, ondansetron **OR** ondansetron (ZOFRAN) IV, promethazine, senna-docusate   Data Review:   Micro Results Recent Results (from the past 240 hour(s))  Urine Culture     Status: Abnormal   Collection Time: 11/02/17 12:19 PM  Result Value Ref Range Status   Specimen Description   Final    URINE, RANDOM Performed at [facility], [address] [place], [place] [address]    Special Requests   Final    NONE Performed at [facility], [address], [place], [place] [address]    Culture MULTIPLE SPECIES PRESENT, SUGGEST RECOLLECTION (A)  Final   Report Status 11/04/2017 FINAL  Final    Radiology Reports Dg Abd Acute W/chest  Result Date: 11/03/2017 CLINICAL DATA:  Abdominal pain.  Question small bowel obstruction. EXAM: DG ABDOMEN ACUTE W/ 1V CHEST COMPARISON:  CT 11/02/2017 FINDINGS: On today's study, there are no longer any identifiable dilated fluid and air-filled loops of small intestine. Massive amount of stool remains evident in the left colon and rectum. No sign of free air. Extensive renal parenchymal calcifications remain visible, right more than left. Chronic spina bifida. No active cardiopulmonary disease. Tortuous aorta and spinal curvature. IMPRESSION: There is no longer any dilated small intestine visible. Very large amount of fecal matter remains evident in the left colon and rectum. No free air. No chest disease. Electronically Signed   By: Nelson Chimes M.D.   On: 11/03/2017 08:58   Ct Renal Stone Study  Result Date: 11/02/2017 CLINICAL DATA:  Vomiting and diarrhea since Monday, generalized abdominal cramping EXAM: CT ABDOMEN AND PELVIS WITHOUT CONTRAST TECHNIQUE: Multidetector CT imaging of the abdomen and pelvis was performed following the standard protocol without IV contrast. Sagittal and coronal MPR images reconstructed from axial data set. Oral contrast was not administered. COMPARISON:   05/07/2017 FINDINGS: Lower chest: Lung bases clear Hepatobiliary: Gallbladder and liver unremarkable Pancreas: Normal appearance Spleen: Small, otherwise unremarkable Adrenals/Urinary Tract: Adrenal glands normal appearance. Numerous large calculi within RIGHT kidney including a staghorn calculus at the  upper to mid RIGHT kidney unchanged. Significant decrease in calculi within LEFT kidney since the previous exam though LEFT hydronephrosis persists. Bladder decompressed. Ostomy in RIGHT lower quadrant appears to be urostomy with the BILATERAL mid ureters deviating towards the ostomy rather than towards the bladder. Stomach/Bowel: Markedly increased stool within the rectum and sigmoid colon with rectal wall thickening compatible with stercoral colitis. Additional ostomy in the LEFT lower quadrant, appears to be a sigmoid loop colostomy. Stomach is distended by fluid and air. Dilated proximal small bowel loops in the upper abdomen up to 5.6 cm diameter with decompressed distal small bowel loops consistent with small bowel obstruction. Question transition zone in the RIGHT mid abdomen. No definite small bowel wall thickening. Proximal, transverse and descending colon decompressed. Vascular/Lymphatic: Aorta normal caliber.  No definite adenopathy. Reproductive: N/A Other: No free air or free fluid.  No hernia. Musculoskeletal: Diffusely demineralized with thoracolumbar dysraphic is features and scoliosis. Degenerative changes of both hip joints much greater on LEFT, with LEFT hip joint effusion present. IMPRESSION: Dilated proximal and decompressed distal small bowel loops compatible with small bowel obstruction, with transition zone in the RIGHT mid abdomen. Double-barrel colostomy in the LEFT lower quadrant with markedly increased stool in the distal limb/rectosigmoid colon associated with rectal wall thickening consistent with stercoral colitis. Apparent urinary diversion in the RIGHT lower quadrant with persistent  LEFT hydronephrosis and numerous BILATERAL renal calculi including staghorn calculi in the RIGHT kidney; significant interval decrease in stone burden within the LEFT kidney. Extensive spinal dysraphism consistent with spina bifida. Electronically Signed   By: Lavonia Dana M.D.   On: 11/02/2017 13:48     CBC Recent Labs  Lab 11/02/17 1219 11/03/17 0409 11/04/17 0325  WBC 10.8* 5.0 4.9  HGB 14.7 11.6* 10.2*  HCT 45.9 36.5* 32.6*  PLT 411 302 269  MCV 83.1 84.1 84.8  MCH 26.5 26.8 26.4  MCHC 31.9* 31.9* 31.2*  RDW 16.8* 16.8* 16.5*    Chemistries  Recent Labs  Lab 11/02/17 1219 11/03/17 0409 11/04/17 0325  NA 136 145 147*  K 4.0 3.5 3.1*  CL 96* 111 116*  CO2 23 23 19*  GLUCOSE 133* 88 67  BUN 55* 42* 22*  CREATININE 2.22* 1.44* 1.06  CALCIUM 9.1 8.0* 8.2*  AST 30  --   --   ALT 16*  --   --   ALKPHOS 97  --   --   BILITOT 0.9  --   --    ------------------------------------------------------------------------------------------------------------------ estimated creatinine clearance is 50.8 mL/min (by C-G formula based on SCr of 1.06 mg/dL). ------------------------------------------------------------------------------------------------------------------ No results for input(s): HGBA1C in the last 72 hours. ------------------------------------------------------------------------------------------------------------------ No results for input(s): CHOL, HDL, LDLCALC, TRIG, CHOLHDL, LDLDIRECT in the last 72 hours. ------------------------------------------------------------------------------------------------------------------ No results for input(s): TSH, T4TOTAL, T3FREE, THYROIDAB in the last 72 hours.  Invalid input(s): FREET3 ------------------------------------------------------------------------------------------------------------------ No results for input(s): VITAMINB12, FOLATE, FERRITIN, TIBC, IRON, RETICCTPCT in the last 72 hours.  Coagulation profile No results  for input(s): INR, PROTIME in the last 168 hours.  No results for input(s): DDIMER in the last 72 hours.  Cardiac Enzymes No results for input(s): CKMB, TROPONINI, MYOGLOBIN in the last 168 hours.  Invalid input(s): CK ------------------------------------------------------------------------------------------------------------------ Invalid input(s): [place]  Patient is a 52 year old with small bowel obstruction  1. Small bowel obstruction. Patient small bowel obstruction seems to have resolvedAcute stercoral colitis.   Resolved Advance diet  2 UTI per urinalysis.   Urine culture with multiple species  will change to oral antibiotics  3. Acute renal failure, possible due to dehydration Resolved with IV fluids  4. LEFT hydronephrosis and numerous BILATERAL renal calculi Appears to be chronic seen by urology no plan for any intervention  5. spina bifida.  Discharge tomorrow      Code Status Orders  (From admission, onward)        Start     Ordered   11/02/17 1731  Full code  Continuous     11/02/17 1730    Code Status History    Date Active Date Inactive Code Status Order ID Comments User Context   10/10/2016 02:05 10/11/2016 21:24 Full Code [id number]  Harvie Bridge, DO Inpatient   06/03/2016 04:29 06/08/2016 19:14 Full Code [id number]  Saundra Shelling, MD Inpatient   04/21/2016 02:38 04/26/2016 18:15 Full Code [id number]  Harrie Foreman, MD Inpatient           Consults surgery urology  DVT Prophylaxis  Lovenox   Lab Results  Component Value Date   PLT 269 11/04/2017     Time Spent in minutes 35 minutes greater than 50% of time spent in care coordination and counseling patient regarding the condition and plan of care.   Dustin Flock M.D on 11/04/2017 at 4:37 PM  Between 7am to 6pm - Pager - [phone]  After 6pm go to [email or web address] - password [email or web address] [facility]   Office  [phone]

## 2017-11-04 NOTE — Care Management (Signed)
Patient admitted with SBO. lives at home with sister.  PCP [facility].  Patient has WC and hospital bed in the home.  Patient has Medicaid Aide PCS services 9 hours a week through [employer].  No dc needs identified at this time

## 2017-11-04 NOTE — Progress Notes (Signed)
Initial Nutrition Assessment  DOCUMENTATION CODES:   Not applicable  INTERVENTION:   Premier Protein BID, each supplement provides 160 kcal and 30 grams of protein.   MVI daily   NUTRITION DIAGNOSIS:   Inadequate oral intake related to acute illness as evidenced by other (comment)(pt on liquid diet ).  GOAL:   Patient will meet greater than or equal to 90% of their needs  MONITOR:   PO intake, Supplement acceptance, Weight trends, Labs, I & O's  REASON FOR ASSESSMENT:   Other (Comment)(low BMI)    ASSESSMENT:   52 year old with h/o spina bifida, colitis, ileostomy, small bowel obstruction   Met with pt in room today. Pt reports good appetite and oral intake at baseline. Pt currently eating 100% of his full liquid diet. Pt asking for regular diet. Pt reports that he drinks Boost at home. Pt would like to have chocolate Premier while hospitalized. Per chart, pt is weight stable. Pt with low K that is being supplemented.   Medications reviewed and include: heparin, KCl, NaCl '@100ml' /hr, ciprofloxacin, metronidazole  Labs reviewed: Na 147(H), K 3.1(L), BUN 22(H), Ca 8.2(L) Hgb 10.2(L), Hct 32.6(L)  Nutrition-Focused physical exam completed. Findings are no fat depletion, severe muscle depletions in legs, and no edema.   Diet Order:  Diet full liquid Room service appropriate? Yes; Fluid consistency: Thin  EDUCATION NEEDS:   Education needs have been addressed  Skin:  Reviewed RN Assessment  Last BM:  2/1- type 7  Height:   Ht Readings from Last 1 Encounters:  11/02/17 '5\' 3"'  (1.6 m)    Weight:   Wt Readings from Last 1 Encounters:  11/02/17 95 lb (43.1 kg)    Ideal Body Weight:  47.3 kg(adjusted for spina bifida )  BMI:  Body mass index is 16.83 kg/m.  Estimated Nutritional Needs:   Kcal:  1300-1500kcal/day   Protein:  52-60g/day   Fluid:  >1.3L/day   Koleen Distance MS, RD, LDN Pager #[phone] After Hours Pager: [phone]

## 2017-11-04 NOTE — Progress Notes (Signed)
Patient requesting a upgrade on his diet.  He did say the surgeon came by earlier and said that the diet would be changed.  Dr pabon notiified and order received for full liquid diet

## 2017-11-04 NOTE — Progress Notes (Signed)
CC: Ileus Subjective: Feeling better, taking clears and he is hungry. AVSS  Objective: Vital signs in last 24 hours: Temp:  [97.4 F (36.3 C)-98.3 F (36.8 C)] 98.3 F (36.8 C) (02/01 1153) Pulse Rate:  [57-64] 63 (02/01 1153) Resp:  [16-21] 21 (02/01 1153) BP: (106-123)/(62-73) 123/73 (02/01 1153) SpO2:  [99 %-100 %] 100 % (02/01 1153) Last BM Date: 11/04/17  Intake/Output from previous day: 01/31 0701 - 02/01 0700 In: 2417 [P.O.:240; I.V.:1727; IV Piggyback:450] Out: 2992 [Urine:1650] Intake/Output this shift: Total I/O In: 1383.3 [P.O.:240; I.V.:1143.3] Out: 500 [Urine:500]  Physical exam:  NAD, alert Abd: soft, NT , mild distension. Stool burden, no peritonitis   Lab Results: CBC  Recent Labs    11/03/17 0409 11/04/17 0325  WBC 5.0 4.9  HGB 11.6* 10.2*  HCT 36.5* 32.6*  PLT 302 269   BMET Recent Labs    11/03/17 0409 11/04/17 0325  NA 145 147*  K 3.5 3.1*  CL 111 116*  CO2 23 19*  GLUCOSE 88 67  BUN 42* 22*  CREATININE 1.44* 1.06  CALCIUM 8.0* 8.2*   PT/INR No results for input(s): LABPROT, INR in the last 72 hours. ABG No results for input(s): PHART, HCO3 in the last 72 hours.  Invalid input(s): PCO2, PO2  Studies/Results: Dg Abd Acute W/chest  Result Date: 11/03/2017 CLINICAL DATA:  Abdominal pain.  Question small bowel obstruction. EXAM: DG ABDOMEN ACUTE W/ 1V CHEST COMPARISON:  CT 11/02/2017 FINDINGS: On today's study, there are no longer any identifiable dilated fluid and air-filled loops of small intestine. Massive amount of stool remains evident in the left colon and rectum. No sign of free air. Extensive renal parenchymal calcifications remain visible, right more than left. Chronic spina bifida. No active cardiopulmonary disease. Tortuous aorta and spinal curvature. IMPRESSION: There is no longer any dilated small intestine visible. Very large amount of fecal matter remains evident in the left colon and rectum. No free air. No chest  disease. Electronically Signed   By: Nelson Chimes M.D.   On: 11/03/2017 08:58   Ct Renal Stone Study  Result Date: 11/02/2017 CLINICAL DATA:  Vomiting and diarrhea since Monday, generalized abdominal cramping EXAM: CT ABDOMEN AND PELVIS WITHOUT CONTRAST TECHNIQUE: Multidetector CT imaging of the abdomen and pelvis was performed following the standard protocol without IV contrast. Sagittal and coronal MPR images reconstructed from axial data set. Oral contrast was not administered. COMPARISON:  05/07/2017 FINDINGS: Lower chest: Lung bases clear Hepatobiliary: Gallbladder and liver unremarkable Pancreas: Normal appearance Spleen: Small, otherwise unremarkable Adrenals/Urinary Tract: Adrenal glands normal appearance. Numerous large calculi within RIGHT kidney including a staghorn calculus at the upper to mid RIGHT kidney unchanged. Significant decrease in calculi within LEFT kidney since the previous exam though LEFT hydronephrosis persists. Bladder decompressed. Ostomy in RIGHT lower quadrant appears to be urostomy with the BILATERAL mid ureters deviating towards the ostomy rather than towards the bladder. Stomach/Bowel: Markedly increased stool within the rectum and sigmoid colon with rectal wall thickening compatible with stercoral colitis. Additional ostomy in the LEFT lower quadrant, appears to be a sigmoid loop colostomy. Stomach is distended by fluid and air. Dilated proximal small bowel loops in the upper abdomen up to 5.6 cm diameter with decompressed distal small bowel loops consistent with small bowel obstruction. Question transition zone in the RIGHT mid abdomen. No definite small bowel wall thickening. Proximal, transverse and descending colon decompressed. Vascular/Lymphatic: Aorta normal caliber.  No definite adenopathy. Reproductive: N/A Other: No free air or free fluid.  No hernia. Musculoskeletal: Diffusely demineralized with thoracolumbar dysraphic is features and scoliosis. Degenerative changes  of both hip joints much greater on LEFT, with LEFT hip joint effusion present. IMPRESSION: Dilated proximal and decompressed distal small bowel loops compatible with small bowel obstruction, with transition zone in the RIGHT mid abdomen. Double-barrel colostomy in the LEFT lower quadrant with markedly increased stool in the distal limb/rectosigmoid colon associated with rectal wall thickening consistent with stercoral colitis. Apparent urinary diversion in the RIGHT lower quadrant with persistent LEFT hydronephrosis and numerous BILATERAL renal calculi including staghorn calculi in the RIGHT kidney; significant interval decrease in stone burden within the LEFT kidney. Extensive spinal dysraphism consistent with spina bifida. Electronically Signed   By: Lavonia Dana M.D.   On: 11/02/2017 13:48    Anti-infectives: Anti-infectives (From admission, onward)   Start     Dose/Rate Route Frequency Ordered Stop   11/04/17 1800  ciprofloxacin (CIPRO) IVPB 400 mg  Status:  Discontinued     400 mg 200 mL/hr over 60 Minutes Intravenous Every 24 hours 11/02/17 2241 11/03/17 0950   11/03/17 1100  ciprofloxacin (CIPRO) IVPB 400 mg     400 mg 200 mL/hr over 60 Minutes Intravenous Every 12 hours 11/03/17 0950     11/02/17 1830  ciprofloxacin (CIPRO) IVPB 400 mg  Status:  Discontinued     400 mg 200 mL/hr over 60 Minutes Intravenous Every 12 hours 11/02/17 1730 11/02/17 2241   11/02/17 1830  metroNIDAZOLE (FLAGYL) IVPB 500 mg     500 mg 100 mL/hr over 60 Minutes Intravenous Every 6 hours 11/02/17 1730        Assessment/Plan:  Ileus improving Advance diet No surgical issues We will sign off  Caroleen Hamman, MD, FACS  11/04/2017

## 2017-11-04 NOTE — Care Management Important Message (Signed)
Important Message  Patient Details  Name: [name] MRN: [id number] Date of Birth: 1966/08/08   Medicare Important Message Given:  Yes    Beverly Sessions, RN 11/04/2017, 4:04 PM

## 2017-11-05 LAB — BASIC METABOLIC PANEL
Anion gap: 11 (ref 5–15)
BUN: 12 mg/dL (ref 6–20)
CHLORIDE: 113 mmol/L — AB (ref 101–111)
CO2: 20 mmol/L — ABNORMAL LOW (ref 22–32)
Calcium: 8.7 mg/dL — ABNORMAL LOW (ref 8.9–10.3)
Creatinine, Ser: 1.13 mg/dL (ref 0.61–1.24)
GFR calc Af Amer: >60 mL/min (ref >60)
GFR calc non Af Amer: >60 mL/min (ref >60)
Glucose, Bld: 86 mg/dL (ref 65–99)
POTASSIUM: 3.8 mmol/L (ref 3.5–5.1)
SODIUM: 144 mmol/L (ref 135–145)

## 2017-11-05 LAB — CBC
HCT: 37.3 % — ABNORMAL LOW (ref 40.0–52.0)
HEMOGLOBIN: 11.8 g/dL — AB (ref 13.0–18.0)
MCH: 26.8 pg (ref 26.0–34.0)
MCHC: 31.7 g/dL — ABNORMAL LOW (ref 32.0–36.0)
MCV: 84.3 fL (ref 80.0–100.0)
Platelets: 308 10*3/uL (ref 150–440)
RBC: 4.43 MIL/uL (ref 4.40–5.90)
RDW: 16.2 % — ABNORMAL HIGH (ref 11.5–14.5)
WBC: 5.2 10*3/uL (ref 3.8–10.6)

## 2017-11-05 MED ORDER — CIPROFLOXACIN HCL 500 MG PO TABS: 500.0000 mg | ORAL_TABLET | Freq: Two times a day (BID) | ORAL | 0 refills | Status: DC

## 2017-11-05 MED ORDER — PREMIER PROTEIN SHAKE: 11.0000 [oz_av] | Freq: Two times a day (BID) | ORAL | 0 refills | Status: DC

## 2017-11-05 NOTE — Progress Notes (Signed)
MD ordered patient to be discharged home.  Discharge instructions were reviewed with the patient and he voiced understanding.  Follow-up appointment was made.  Prescriptions given to the patient.  IV was removed with catheter intact.  Patient's sister was notified of the discharge as well.  All patients questions were answered.  Patient waiting EMS transfer home.

## 2017-11-05 NOTE — Progress Notes (Signed)
[place] at [facility]                                                                                                                                                                                  Patient Demographics   [name], is a 52 y.o. male, DOB - [birth year], MVH:[id number]  Admit date - 11/02/2017   Admitting Physician Demetrios Loll, MD  Outpatient Primary MD for the patient is Norva Riffle, MD   LOS - 3  Subjective: Stable for discharge, waiting for her sister to pick him up.  Wanted Cipro prescription to be printed.  Review of Systems:   CONSTITUTIONAL: No documented fever. No fatigue, weakness. No weight gain, no weight loss.  EYES: No blurry or double vision.  ENT: No tinnitus. No postnasal drip. No redness of the oropharynx.  RESPIRATORY: No cough, no wheeze, no hemoptysis. No dyspnea.  CARDIOVASCULAR: No chest pain. No orthopnea. No palpitations. No syncope.  GASTROINTESTINAL: Complains of nausea and some abdominal discomfort GENITOURINARY: No dysuria or hematuria.  ENDOCRINE: No polyuria or nocturia. No heat or cold intolerance.  HEMATOLOGY: No anemia. No bruising. No bleeding.  INTEGUMENTARY: No rashes. No lesions.  MUSCULOSKELETAL: No arthritis. No swelling. No gout.  NEUROLOGIC: No numbness, tingling, or ataxia. No seizure-type activity.  PSYCHIATRIC: No anxiety. No insomnia. No ADD.    Vitals:   Vitals:   11/04/17 1153 11/04/17 2026 11/05/17 0625 11/05/17 1303  BP: 123/73 125/73 (!) 141/66 120/74  Pulse: 63 68 72 92  Resp: (!) 21 20 (!) 21 15  Temp: 98.3 F (36.8 C) 98.5 F (36.9 C) 97.8 F (36.6 C) 98.8 F (37.1 C)  TempSrc: Oral  Axillary Axillary  SpO2: 100% 100% 99% 97%  Weight:      Height:        Wt Readings from Last 3 Encounters:  11/02/17 43.1 kg (95 lb)  05/07/17 41.7 kg (92 lb)  10/10/16 42 kg (92 lb 9.6 oz)     Intake/Output Summary (Last 24 hours) at 11/05/2017 1423 Last data filed at  11/05/2017 1034 Gross per 24 hour  Intake 1001.67 ml  Output 2101 ml  Net -1099.33 ml    Physical Exam:   GENERAL: Pleasant-appearing in no apparent distress.  HEAD, EYES, EARS, NOSE AND THROAT: Atraumatic, normocephalic. Extraocular muscles are intact. Pupils equal and reactive to light. Sclerae anicteric. No conjunctival injection. No oro-pharyngeal erythema.  NECK: Supple. There is no jugular venous distention. No bruits, no lymphadenopathy, no thyromegaly.  HEART: Regular rate and rhythm,. No murmurs, no rubs, no clicks.  LUNGS: Clear to auscultation bilaterally. No rales or rhonchi. No wheezes.  ABDOMEN: Soft, positive bowel  sounds x4 EXTREMITIES: Spina bifida NEUROLOGIC: The patient is alert, awake, and oriented x3 with no focal motor or sensory deficits appreciated bilaterally.  SKIN: Moist and warm with no rashes appreciated.  Psych: Not anxious, depressed LN: No inguinal LN enlargement    Antibiotics   Anti-infectives (From admission, onward)   Start     Dose/Rate Route Frequency Ordered Stop   11/05/17 0000  ciprofloxacin (CIPRO) 500 MG tablet  Status:  Discontinued     500 mg Oral 2 times daily 11/05/17 1202 11/05/17    11/05/17 0000  ciprofloxacin (CIPRO) 500 MG tablet     500 mg Oral 2 times daily 11/05/17 1212     11/04/17 2000  ciprofloxacin (CIPRO) tablet 500 mg     500 mg Oral 2 times daily 11/04/17 1643     11/04/17 1800  ciprofloxacin (CIPRO) IVPB 400 mg  Status:  Discontinued     400 mg 200 mL/hr over 60 Minutes Intravenous Every 24 hours 11/02/17 2241 11/03/17 0950   11/03/17 1100  ciprofloxacin (CIPRO) IVPB 400 mg  Status:  Discontinued     400 mg 200 mL/hr over 60 Minutes Intravenous Every 12 hours 11/03/17 0950 11/04/17 1643   11/02/17 1830  ciprofloxacin (CIPRO) IVPB 400 mg  Status:  Discontinued     400 mg 200 mL/hr over 60 Minutes Intravenous Every 12 hours 11/02/17 1730 11/02/17 2241   11/02/17 1830  metroNIDAZOLE (FLAGYL) IVPB 500 mg  Status:   Discontinued     500 mg 100 mL/hr over 60 Minutes Intravenous Every 6 hours 11/02/17 1730 11/04/17 1643      Medications   Scheduled Meds: . ciprofloxacin  500 mg Oral BID  . heparin  5,000 Units Subcutaneous Q8H  . multivitamin with minerals  1 tablet Oral Daily  . protein supplement shake  11 oz Oral BID BM   Continuous Infusions:  PRN Meds:.acetaminophen **OR** acetaminophen, albuterol, bisacodyl, ondansetron **OR** ondansetron (ZOFRAN) IV, promethazine, senna-docusate   Data Review:   Micro Results Recent Results (from the past 240 hour(s))  Urine Culture     Status: Abnormal   Collection Time: 11/02/17 12:19 PM  Result Value Ref Range Status   Specimen Description   Final    URINE, RANDOM Performed at [facility], [address], [place], [place] [address]    Special Requests   Final    NONE Performed at [facility], [address] [place], [place] [address]    Culture MULTIPLE SPECIES PRESENT, SUGGEST RECOLLECTION (A)  Final   Report Status 11/04/2017 FINAL  Final    Radiology Reports Dg Abd Acute W/chest  Result Date: 11/03/2017 CLINICAL DATA:  Abdominal pain.  Question small bowel obstruction. EXAM: DG ABDOMEN ACUTE W/ 1V CHEST COMPARISON:  CT 11/02/2017 FINDINGS: On today's study, there are no longer any identifiable dilated fluid and air-filled loops of small intestine. Massive amount of stool remains evident in the left colon and rectum. No sign of free air. Extensive renal parenchymal calcifications remain visible, right more than left. Chronic spina bifida. No active cardiopulmonary disease. Tortuous aorta and spinal curvature. IMPRESSION: There is no longer any dilated small intestine visible. Very large amount of fecal matter remains evident in the left colon and rectum. No free air. No chest disease. Electronically Signed   By: Nelson Chimes M.D.   On: 11/03/2017 08:58   Ct Renal Stone Study  Result Date: 11/02/2017 CLINICAL DATA:   Vomiting and diarrhea since Monday, generalized abdominal cramping EXAM:  CT ABDOMEN AND PELVIS WITHOUT CONTRAST TECHNIQUE: Multidetector CT imaging of the abdomen and pelvis was performed following the standard protocol without IV contrast. Sagittal and coronal MPR images reconstructed from axial data set. Oral contrast was not administered. COMPARISON:  05/07/2017 FINDINGS: Lower chest: Lung bases clear Hepatobiliary: Gallbladder and liver unremarkable Pancreas: Normal appearance Spleen: Small, otherwise unremarkable Adrenals/Urinary Tract: Adrenal glands normal appearance. Numerous large calculi within RIGHT kidney including a staghorn calculus at the upper to mid RIGHT kidney unchanged. Significant decrease in calculi within LEFT kidney since the previous exam though LEFT hydronephrosis persists. Bladder decompressed. Ostomy in RIGHT lower quadrant appears to be urostomy with the BILATERAL mid ureters deviating towards the ostomy rather than towards the bladder. Stomach/Bowel: Markedly increased stool within the rectum and sigmoid colon with rectal wall thickening compatible with stercoral colitis. Additional ostomy in the LEFT lower quadrant, appears to be a sigmoid loop colostomy. Stomach is distended by fluid and air. Dilated proximal small bowel loops in the upper abdomen up to 5.6 cm diameter with decompressed distal small bowel loops consistent with small bowel obstruction. Question transition zone in the RIGHT mid abdomen. No definite small bowel wall thickening. Proximal, transverse and descending colon decompressed. Vascular/Lymphatic: Aorta normal caliber.  No definite adenopathy. Reproductive: N/A Other: No free air or free fluid.  No hernia. Musculoskeletal: Diffusely demineralized with thoracolumbar dysraphic is features and scoliosis. Degenerative changes of both hip joints much greater on LEFT, with LEFT hip joint effusion present. IMPRESSION: Dilated proximal and decompressed distal small bowel  loops compatible with small bowel obstruction, with transition zone in the RIGHT mid abdomen. Double-barrel colostomy in the LEFT lower quadrant with markedly increased stool in the distal limb/rectosigmoid colon associated with rectal wall thickening consistent with stercoral colitis. Apparent urinary diversion in the RIGHT lower quadrant with persistent LEFT hydronephrosis and numerous BILATERAL renal calculi including staghorn calculi in the RIGHT kidney; significant interval decrease in stone burden within the LEFT kidney. Extensive spinal dysraphism consistent with spina bifida. Electronically Signed   By: Lavonia Dana M.D.   On: 11/02/2017 13:48     CBC Recent Labs  Lab 11/02/17 1219 11/03/17 0409 11/04/17 0325 11/05/17 0634  WBC 10.8* 5.0 4.9 5.2  HGB 14.7 11.6* 10.2* 11.8*  HCT 45.9 36.5* 32.6* 37.3*  PLT 411 302 269 308  MCV 83.1 84.1 84.8 84.3  MCH 26.5 26.8 26.4 26.8  MCHC 31.9* 31.9* 31.2* 31.7*  RDW 16.8* 16.8* 16.5* 16.2*    Chemistries  Recent Labs  Lab 11/02/17 1219 11/03/17 0409 11/04/17 0325 11/05/17 0634  NA 136 145 147* 144  K 4.0 3.5 3.1* 3.8  CL 96* 111 116* 113*  CO2 23 23 19* 20*  GLUCOSE 133* 88 67 86  BUN 55* 42* 22* 12  CREATININE 2.22* 1.44* 1.06 1.13  CALCIUM 9.1 8.0* 8.2* 8.7*  AST 30  --   --   --   ALT 16*  --   --   --   ALKPHOS 97  --   --   --   BILITOT 0.9  --   --   --    ------------------------------------------------------------------------------------------------------------------ estimated creatinine clearance is 47.7 mL/min (by C-G formula based on SCr of 1.13 mg/dL). ------------------------------------------------------------------------------------------------------------------ No results for input(s): HGBA1C in the last 72 hours. ------------------------------------------------------------------------------------------------------------------ No results for input(s): CHOL, HDL, LDLCALC, TRIG, CHOLHDL, LDLDIRECT in the last 72  hours. ------------------------------------------------------------------------------------------------------------------ No results for input(s): TSH, T4TOTAL, T3FREE, THYROIDAB in the last 72 hours.  Invalid input(s): FREET3 ------------------------------------------------------------------------------------------------------------------  No results for input(s): VITAMINB12, FOLATE, FERRITIN, TIBC, IRON, RETICCTPCT in the last 72 hours.  Coagulation profile No results for input(s): INR, PROTIME in the last 168 hours.  No results for input(s): DDIMER in the last 72 hours.  Cardiac Enzymes No results for input(s): CKMB, TROPONINI, MYOGLOBIN in the last 168 hours.  Invalid input(s): CK ------------------------------------------------------------------------------------------------------------------ Invalid input(s): [place]  Patient is a 52 year old with small bowel obstruction  1. Small bowel obstruction. Patient small bowel obstruction seems to have resolvedAcute stercoral colitis.   Resolved No further diarrhea, eager to go home, finish Cipro for 7 days.   2 UTI per urinalysis.   Urine culture with multiple species will change to oral antibiotics  3. Acute renal failure, possible due to dehydration Resolved with IV fluids  4. LEFT hydronephrosis and numerous BILATERAL renal calculi Appears to be chronic seen by urology no plan for any intervention  5. spina bifida.  Discharge home today, discharge instructions are in the computer.      Code Status Orders  (From admission, onward)        Start     Ordered   11/02/17 1731  Full code  Continuous     11/02/17 1730    Code Status History    Date Active Date Inactive Code Status Order ID Comments User Context   10/10/2016 02:05 10/11/2016 21:24 Full Code [id number]  Harvie Bridge, DO Inpatient   06/03/2016 04:29 06/08/2016 19:14 Full Code [id number]  Saundra Shelling, MD Inpatient   04/21/2016  02:38 04/26/2016 18:15 Full Code [id number]  Harrie Foreman, MD Inpatient           Consults surgery urology  DVT Prophylaxis  Lovenox   Lab Results  Component Value Date   PLT 308 11/05/2017     Time Spent in minutes 35 minutes greater than 50% of time spent in care coordination and counseling patient regarding the condition and plan of care.   Epifanio Lesches M.D on 11/05/2017 at 2:23 PM  Between 7am to 6pm - Pager - [phone]  After 6pm go to [email or web address] - password [email or web address] [facility]   Office  [phone]

## 2017-11-05 NOTE — Care Management Note (Signed)
Case Management Note  Patient Details  Name: Dionte Blaustein MRN: [id number] Date of Birth: Jul 08, 1966  Subjective/Objective:   To home via EMS per non-ambulatory per spinal bifida and sister's request..                  Action/Plan:   Expected Discharge Date:  11/05/17               Expected Discharge Plan:     In-House Referral:     Discharge planning Services     Post Acute Care Choice:    Choice offered to:     DME Arranged:    DME Agency:     HH Arranged:    HH Agency:     Status of Service:     If discussed at [employer], dates discussed:    Additional Comments:  Mory Herrman A, RN 11/05/2017, 3:28 PM

## 2017-11-05 NOTE — Progress Notes (Signed)
Patient left via stretcher escorted by EMS. 

## 2017-11-09 NOTE — Discharge Summary (Signed)
[name], is a 52 y.o. male  DOB [birth year]  MRN [id number].  Admission date:  11/02/2017  Admitting Physician  Demetrios Loll, MD  Discharge Date:  11/05/2017   Primary MD  Norva Riffle, MD  Recommendations for primary care physician for things to follow:  Follow-up with PCP in 1 week    Admission Diagnosis  Small bowel obstruction (HCC) [K56.609] Nausea vomiting and diarrhea [R11.2, R19.7] Constipation, unspecified constipation type [K59.00] Acute renal failure, unspecified acute renal failure type [facility]) [N17.9]   Discharge Diagnosis  Small bowel obstruction (HCC) [K56.609] Nausea vomiting and diarrhea [R11.2, R19.7] Constipation, unspecified constipation type [K59.00] Acute renal failure, unspecified acute renal failure type ([place]) [N17.9]    Active Problems:   SBO (small bowel obstruction) ([place])      Past Medical History:  Diagnosis Date  . Decubitus ulcer   . Renal disorder   . Spina bifida [facility])     Past Surgical History:  Procedure Laterality Date  . ABDOMINAL SURGERY    . BACK SURGERY    . COLON SURGERY    . ESOPHAGOGASTRODUODENOSCOPY (EGD) WITH PROPOFOL N/A 06/03/2016   Procedure: ESOPHAGOGASTRODUODENOSCOPY (EGD) WITH PROPOFOL;  Surgeon: [name], MD;  Location: [facility] ENDOSCOPY;  Service: Endoscopy;  Laterality: N/A;  . ilieostomy    . VENTRICULOPERITONEAL SHUNT    . vp shunt removal         History of present illness and  Hospital Course:     Kindly see H&P for history of present illness and admission details, please review complete Labs, Consult reports and Test reports for all details in brief  HPI  from the history and physical done on the day of admission 52 year old male patient with history of colostomy, nephrostomy secondary to abdominal pain with nausea, vomiting, CT abdomen  on admission showed small bowel obstruction with colitis,   Hospital Course  #1 small bowel obstruction: Improved with symptomatic treatment with bowel rest, IV fluids, IV antibiotics with Cipro, discharged home with Cipro 500 mg twice daily for 7 days.  Patient received Cipro and Flagyl initially. 2.  Chronic left hydronephrosis, bili numerous bilateral renal calculi, seen by urology, because of chronic region but no plan for any intervention.   , Patient can follow-up with primary [facility]rologist at [facility] as per urology note.. 3.  Spina bifida 4. acute renal failure due to dehydration: Resolved with diet IV fluids.     Discharge Condition:    Follow UP  Follow-up Information    Mintzer, Melanie, MD. Schedule an appointment as soon as possible for a visit in 1 week(s).   Contact information: [address] [place] [address] [phone]             Discharge Instructions  and  Discharge Medications     Allergies as of 11/05/2017      Reactions   Ivp Dye [iodinated Diagnostic Agents] Hives   Morphine And Related Nausea And Vomiting   Sulfa Antibiotics Other (See Comments)   Reaction: unknown   Latex Rash      Medication List    TAKE these medications   CENTRUM MEN PO Take 1 tablet by mouth daily.   ciprofloxacin 500 MG tablet Commonly known as:  CIPRO Take 1 tablet (500 mg total) by mouth 2 (two) times daily.   feeding supplement (ENSURE ENLIVE) Liqd Take 237 mLs by mouth 2 (two) times daily between meals.   ondansetron 4 MG disintegrating tablet Commonly known as:  ZOFRAN-ODT  Take 4 mg by mouth every 8 (eight) hours as needed.   polyethylene glycol packet Commonly known as:  MIRALAX / GLYCOLAX Take 17 g by mouth 2 (two) times daily.   protein supplement shake Liqd Commonly known as:  PREMIER PROTEIN Take 325 mLs (11 oz total) by mouth 2 (two) times daily between meals.         Diet and Activity recommendation: See Discharge Instructions  above   Consults obtained - *\urology, surgery   Major procedures and Radiology Reports - PLEASE review detailed and final reports for all details, in brief -      Dg Abd Acute W/chest  Result Date: 11/03/2017 CLINICAL DATA:  Abdominal pain.  Question small bowel obstruction. EXAM: DG ABDOMEN ACUTE W/ 1V CHEST COMPARISON:  CT 11/02/2017 FINDINGS: On today's study, there are no longer any identifiable dilated fluid and air-filled loops of small intestine. Massive amount of stool remains evident in the left colon and rectum. No sign of free air. Extensive renal parenchymal calcifications remain visible, right more than left. Chronic spina bifida. No active cardiopulmonary disease. Tortuous aorta and spinal curvature. IMPRESSION: There is no longer any dilated small intestine visible. Very large amount of fecal matter remains evident in the left colon and rectum. No free air. No chest disease. Electronically Signed   By: Nelson Chimes M.D.   On: 11/03/2017 08:58   Ct Renal Stone Study  Result Date: 11/02/2017 CLINICAL DATA:  Vomiting and diarrhea since Monday, generalized abdominal cramping EXAM: CT ABDOMEN AND PELVIS WITHOUT CONTRAST TECHNIQUE: Multidetector CT imaging of the abdomen and pelvis was performed following the standard protocol without IV contrast. Sagittal and coronal MPR images reconstructed from axial data set. Oral contrast was not administered. COMPARISON:  05/07/2017 FINDINGS: Lower chest: Lung bases clear Hepatobiliary: Gallbladder and liver unremarkable Pancreas: Normal appearance Spleen: Small, otherwise unremarkable Adrenals/Urinary Tract: Adrenal glands normal appearance. Numerous large calculi within RIGHT kidney including a staghorn calculus at the upper to mid RIGHT kidney unchanged. Significant decrease in calculi within LEFT kidney since the previous exam though LEFT hydronephrosis persists. Bladder decompressed. Ostomy in RIGHT lower quadrant appears to be urostomy with the  BILATERAL mid ureters deviating towards the ostomy rather than towards the bladder. Stomach/Bowel: Markedly increased stool within the rectum and sigmoid colon with rectal wall thickening compatible with stercoral colitis. Additional ostomy in the LEFT lower quadrant, appears to be a sigmoid loop colostomy. Stomach is distended by fluid and air. Dilated proximal small bowel loops in the upper abdomen up to 5.6 cm diameter with decompressed distal small bowel loops consistent with small bowel obstruction. Question transition zone in the RIGHT mid abdomen. No definite small bowel wall thickening. Proximal, transverse and descending colon decompressed. Vascular/Lymphatic: Aorta normal caliber.  No definite adenopathy. Reproductive: N/A Other: No free air or free fluid.  No hernia. Musculoskeletal: Diffusely demineralized with thoracolumbar dysraphic is features and scoliosis. Degenerative changes of both hip joints much greater on LEFT, with LEFT hip joint effusion present. IMPRESSION: Dilated proximal and decompressed distal small bowel loops compatible with small bowel obstruction, with transition zone in the RIGHT mid abdomen. Double-barrel colostomy in the LEFT lower quadrant with markedly increased stool in the distal limb/rectosigmoid colon associated with rectal wall thickening consistent with stercoral colitis. Apparent urinary diversion in the RIGHT lower quadrant with persistent LEFT hydronephrosis and numerous BILATERAL renal calculi including staghorn calculi in the RIGHT kidney; significant interval decrease in stone burden within the LEFT kidney. Extensive spinal dysraphism consistent with  spina bifida. Electronically Signed   By: Lavonia Dana M.D.   On: 11/02/2017 13:48    Micro Results    Recent Results (from the past 240 hour(s))  Urine Culture     Status: Abnormal   Collection Time: 11/02/17 12:19 PM  Result Value Ref Range Status   Specimen Description   Final    URINE, RANDOM Performed at  [facility], [address], [place], [place] [address]    Special Requests   Final    NONE Performed at [facility], [address] [place], [place] [address]    Culture MULTIPLE SPECIES PRESENT, SUGGEST RECOLLECTION (A)  Final   Report Status 11/04/2017 FINAL  Final       Today   Subjective:   [name] today has no headache,no chest abdominal pain,no new weakness tingling or numbness, feels much better wants to go home today.  Objective:   Blood pressure 120/74, pulse 92, temperature 98.8 F (37.1 C), temperature source Axillary, resp. rate 15, height 5\' 3"  (1.6 m), weight 43.1 kg (95 lb), SpO2 97 %.  No intake or output data in the 24 hours ending 11/09/17 2152  Exam Awake Alert, Oriented x 3, No new F.N deficits, Normal affect [place].AT,PERRAL Supple Neck,No JVD, No cervical lymphadenopathy appriciated.  Symmetrical Chest wall movement, Good air movement bilaterally, CTAB RRR,No Gallops,Rubs or new Murmurs, No Parasternal Heave +ve B.Sounds, Abd Soft, Non tender, No organomegaly appriciated, No rebound -guarding or rigidity. No Cyanosis, Clubbing or edema, No new Rash or bruis Replens, but no other visual  Data Review   CBC w Diff:  Lab Results  Component Value Date   WBC 5.2 11/05/2017   HGB 11.8 (L) 11/05/2017   HCT 37.3 (L) 11/05/2017   PLT 308 11/05/2017   LYMPHOPCT 4 06/02/2016   MONOPCT 5 06/02/2016   EOSPCT 0 06/02/2016   BASOPCT 0 06/02/2016    CMP:  Lab Results  Component Value Date   NA 144 11/05/2017   K 3.8 11/05/2017   CL 113 (H) 11/05/2017   CO2 20 (L) 11/05/2017   BUN 12 11/05/2017   CREATININE 1.13 11/05/2017   PROT 9.1 (H) 11/02/2017   ALBUMIN 4.2 11/02/2017   BILITOT 0.9 11/02/2017   ALKPHOS 97 11/02/2017   AST 30 11/02/2017   ALT 16 (L) 11/02/2017  .   Total Time in preparing paper work, data evaluation and todays exam - 66 minutes  Epifanio Lesches M.D on 11/05/2017 at 9:52 PM    Note: This  dictation was prepared with Dragon dictation along with smaller phrase technology. Any transcriptional errors that result from this process are unintentional.

## 2018-10-02 ENCOUNTER — Other Ambulatory Visit: Payer: Self-pay

## 2018-10-02 ENCOUNTER — Emergency Department: Payer: Medicare Other

## 2018-10-02 ENCOUNTER — Emergency Department
Admission: EM | Admit: 2018-10-02 | Discharge: 2018-10-02 | Disposition: A | Discharge status: Home / Self Care | Payer: Medicare Other | Attending: Emergency Medicine | Admitting: Emergency Medicine

## 2018-10-02 ENCOUNTER — Encounter: Payer: Self-pay | Admitting: Emergency Medicine

## 2018-10-02 DIAGNOSIS — S82102A Unspecified fracture of upper end of left tibia, initial encounter for closed fracture: Secondary | ICD-10-CM | POA: Insufficient documentation

## 2018-10-02 DIAGNOSIS — W050XXA Fall from non-moving wheelchair, initial encounter: Secondary | ICD-10-CM | POA: Diagnosis not present

## 2018-10-02 DIAGNOSIS — Y929 Unspecified place or not applicable: Secondary | ICD-10-CM | POA: Insufficient documentation

## 2018-10-02 DIAGNOSIS — Y998 Other external cause status: Secondary | ICD-10-CM | POA: Insufficient documentation

## 2018-10-02 DIAGNOSIS — Q059 Spina bifida, unspecified: Secondary | ICD-10-CM | POA: Diagnosis not present

## 2018-10-02 DIAGNOSIS — S82202A Unspecified fracture of shaft of left tibia, initial encounter for closed fracture: Secondary | ICD-10-CM

## 2018-10-02 DIAGNOSIS — Z91040 Latex allergy status: Secondary | ICD-10-CM | POA: Insufficient documentation

## 2018-10-02 DIAGNOSIS — S8992XA Unspecified injury of left lower leg, initial encounter: Secondary | ICD-10-CM | POA: Diagnosis present

## 2018-10-02 DIAGNOSIS — Y9389 Activity, other specified: Secondary | ICD-10-CM | POA: Insufficient documentation

## 2018-10-02 DIAGNOSIS — S82832A Other fracture of upper and lower end of left fibula, initial encounter for closed fracture: Secondary | ICD-10-CM | POA: Insufficient documentation

## 2018-10-02 DIAGNOSIS — S82402A Unspecified fracture of shaft of left fibula, initial encounter for closed fracture: Secondary | ICD-10-CM

## 2018-10-02 MED ORDER — MUPIROCIN CALCIUM 2 % EX CREA: TOPICAL_CREAM | CUTANEOUS | 0 refills | Status: DC

## 2018-10-02 NOTE — ED Provider Notes (Signed)
[facility] Emergency Department Provider Note       Time seen: ----------------------------------------- 3:20 PM on 10/02/2018 -----------------------------------------   I have reviewed the triage vital signs and the nursing notes.  HISTORY   Chief Complaint Fall and Leg Injury    HPI [name] is a 52 y.o. male with a history of decubitus ulcer, renal disorder, spina bifida, bowel obstruction who presents to the ED for a fall.  Patient fell approximately 2 feet of his wheelchair when being moved from a van.  He is a sensate in his legs, has not been complaining of any pain.  Family noticed that he had bruising on his left lower extremity.  He also had some wound drainage near that area on his left proximal tibia.  Past Medical History:  Diagnosis Date  . Decubitus ulcer   . Renal disorder   . Spina bifida [facility])     Patient Active Problem List   Diagnosis Date Noted  . SBO (small bowel obstruction) ([place]) 11/02/2017  . Abdominal distention 10/11/2016  . Distal intestinal obstruction syndrome ([place]) 10/11/2016  . Hypokalemia 10/11/2016  . Leukocytosis 10/11/2016  . Loose stools 10/11/2016  . Fecal impaction in rectum ([place]) 10/10/2016  . Nausea & vomiting 06/03/2016  . Gastric outlet obstruction 06/03/2016  . SIRS (systemic inflammatory response syndrome) ([place]) 06/03/2016  . Pressure ulcer 06/03/2016  . Impaction of the bowels ([place])   . Gastritis   . Ulcer of esophagus with bleeding   . Nausea with vomiting   . Nausea vomiting and diarrhea   . Sepsis ([place]) 04/21/2016    Past Surgical History:  Procedure Laterality Date  . ABDOMINAL SURGERY    . BACK SURGERY    . COLON SURGERY    . ESOPHAGOGASTRODUODENOSCOPY (EGD) WITH PROPOFOL N/A 06/03/2016   Procedure: ESOPHAGOGASTRODUODENOSCOPY (EGD) WITH PROPOFOL;  Surgeon: [name], MD;  Location: [facility] ENDOSCOPY;  Service: Endoscopy;  Laterality: N/A;  . ilieostomy    . VENTRICULOPERITONEAL  SHUNT    . vp shunt removal      Allergies Ivp dye [iodinated diagnostic agents]; Morphine and related; Sulfa antibiotics; and Latex  Social History Social History   Tobacco Use  . Smoking status: Never Smoker  . Smokeless tobacco: Never Used  Substance Use Topics  . Alcohol use: No  . Drug use: No   Review of Systems Constitutional: Negative for fever. Cardiovascular: Negative for chest pain. Respiratory: Negative for shortness of breath. Gastrointestinal: Negative for abdominal pain, vomiting and diarrhea. Musculoskeletal: Positive for left leg swelling and bruising Skin: Positive for bruising Neurological: Negative for headaches, focal weakness or numbness.  All systems negative/normal/unremarkable except as stated in the HPI  ____________________________________________   PHYSICAL EXAM:  VITAL SIGNS: ED Triage Vitals  Enc Vitals Group     BP 10/02/18 1219 (!) 149/89     Pulse Rate 10/02/18 1219 93     Resp 10/02/18 1219 18     Temp 10/02/18 1219 (!) 97.3 F (36.3 C)     Temp Source 10/02/18 1219 Axillary     SpO2 10/02/18 1219 98 %     Weight 10/02/18 1219 120 lb (54.4 kg)     Height --      Head Circumference --      Peak Flow --      Pain Score 10/02/18 1227 0     Pain Loc --      Pain Edu? --      Excl. in [place]? --  Constitutional: Alert and oriented. Well appearing and in no distress. Musculoskeletal: Chronic deformity to the left lower extremity, ecchymosis and swelling is noted. Neurologic:  Normal speech and language. No gross focal neurologic deficits are appreciated.  Skin: Ecchymosis and swelling to the left lower extremity, particularly below the knee.  Chronic appearing deformities of both lower extremities.  Left ankle is rotated outward chronically in appearance. Psychiatric: Mood and affect are normal. Speech and behavior are normal.  ____________________________________________  ED COURSE:  As part of my medical decision making, I  reviewed the following data within the [place] History obtained from family if available, nursing notes, old chart and ekg, as well as notes from prior ED visits. Patient presented for left lower extremity injury after a fall, we will assess with imaging as indicated at this time.   Procedures ____________________________________________   RADIOLOGY Images were viewed by me Left knee complete IMPRESSION: 1. Transverse fractures of the proximal fibula with 2 mm dorsal displacement of the proximal diaphyseal fracture. 2. Oblique fracture of the proximal tibia with 5 mm dorsal displacement. 3. Equivocal oblique fracture of the proximal-mid tibial diaphysis. 4. Chronic knee/distal femur deformity and osteoporosis.   ____________________________________________  DIFFERENTIAL DIAGNOSIS   Tib-fib fracture, talar fracture, dislocation, contusion  FINAL ASSESSMENT AND PLAN  Tib-fib fracture, contusion   Plan: The patient had presented for fall out of his wheelchair around Christmas. Patient's imaging did reveal proximal fibula and proximal tibia fracture.  Patient is nonambulatory and a sensate.  Compartments feel soft at this time.  We will place him in a splint and refer him to orthopedics for close outpatient follow-up.   Laurence Aly, MD   Note: This note was generated in part or whole with voice recognition software. Voice recognition is usually quite accurate but there are transcription errors that can and very often do occur. I apologize for any typographical errors that were not detected and corrected.     Earleen Newport, MD 10/02/18 [phone]

## 2018-10-02 NOTE — ED Triage Notes (Signed)
[place] about 2 feet from [place].  Says there is a difference in left knee area.  Also has awound below the knee with pus.  Yellowed skin from bruising around that.  Has history of spina bifida and has no feeling in legs.

## 2018-10-02 NOTE — ED Notes (Signed)
Pt fell from [place] on christmas day and has bruising with abrasions to the left knee, states  He only had one day of drainage from the site. Denies any fevers or other sx. Pt has spina bifida and is wheelchair bound.

## 2018-12-28 DIAGNOSIS — D638 Anemia in other chronic diseases classified elsewhere: Secondary | ICD-10-CM | POA: Insufficient documentation

## 2018-12-28 DIAGNOSIS — D649 Anemia, unspecified: Secondary | ICD-10-CM | POA: Insufficient documentation

## 2018-12-28 DIAGNOSIS — E559 Vitamin D deficiency, unspecified: Secondary | ICD-10-CM | POA: Insufficient documentation

## 2019-02-05 ENCOUNTER — Ambulatory Visit: Payer: Medicare Other | Admitting: Urology

## 2019-03-21 ENCOUNTER — Encounter: Payer: Self-pay | Admitting: Urology

## 2019-03-21 ENCOUNTER — Ambulatory Visit (INDEPENDENT_AMBULATORY_CARE_PROVIDER_SITE_OTHER): Payer: Medicare Other | Admitting: Urology

## 2019-03-21 ENCOUNTER — Other Ambulatory Visit: Payer: Self-pay

## 2019-03-21 VITALS — BP 139/95 | HR 103 | Ht 63.0 in

## 2019-03-21 DIAGNOSIS — Z936 Other artificial openings of urinary tract status: Secondary | ICD-10-CM

## 2019-03-21 DIAGNOSIS — N200 Calculus of kidney: Secondary | ICD-10-CM | POA: Diagnosis not present

## 2019-03-21 DIAGNOSIS — N1339 Other hydronephrosis: Secondary | ICD-10-CM

## 2019-03-21 LAB — URINALYSIS
Bilirubin, UA: NEGATIVE
Glucose, UA: NEGATIVE
Ketones, UA: NEGATIVE
Nitrite, UA: POSITIVE — AB
Specific Gravity, UA: 1.02 (ref 1.005–1.030)
Urobilinogen, Ur: 0.2 mg/dL (ref 0.2–1.0)
pH, UA: 8.5 — ABNORMAL HIGH (ref 5.0–7.5)

## 2019-03-21 NOTE — Progress Notes (Addendum)
03/21/2019 10:42 AM   [name] 21-Jun-1968 [id number]  Referring provider: Elisabeth Cara, NP [address] [place],  [place] [address]  No chief complaint on file.   HPI:  Mr. Wandrey was referred over for long-term management of ileal conduit.  He has a history of spina bifida and ileal conduit placement. He has a h/o left hydronephrosis but had reflux on loopogram in 2011. CT showed left hydro and left perinephric fistula. He had ~ 50/50 fxn with a nephroureteral stent which was later removed. By 2013 there was left hydro noted on CT scan with no ureteral stone obstructing. Nephrectomy was considered per pt. He has a history of kidney stones with PCNL in 2014 and 2015. He saw Dr. Bernardo Heater at [facility] and he referred to Dr. Aleene Davidson. Last procedure was a multi look bilateral PNCL with Dr. Aleene Davidson at [facility] in 2018.   Last upper tract imaging was CT 10/2017 at [facility] and 11/2017 at [facility] - interval removal of bilateral neprhostomy tubes. There is a right partial staghorn calculus with the largest component measuring approximately 2.4 x 1.6 cm. There are additional smaller bilateral nonobstructing renal calculi. Mild urothelial enhancement bilaterally, likely related to chronic inflammation. There is new left hydronephrosis with tapering immediately distal to the UPJ.  He had the urostomy revised with Dr. Donia Ast at [facility] in 2016. He also has a history of chronic bowel dysmotility.  He had a loop colostomy done in June 2018, but continued to have problems and underwent complete APR with colostomy placement December 2019 also with Dr. Audie Clear.   His 12/28/2018 bun was 21 and Cr 1.0.   He reports he has had left flank pain which started to hurt again over the past few months since his colostomy was redone. He hasn't seen a stone pass. He has dark urine, but no red urine or clots. He drinks plenty of water.   Modifying factors: There are no other modifying factors  Associated signs and  symptoms: There are no other associated signs and symptoms Aggravating and relieving factors: There are no other aggravating or relieving factors Severity: Moderate Duration: Persistent    PMH: Past Medical History:  Diagnosis Date  . Decubitus ulcer   . Renal disorder   . Spina bifida [facility])     Surgical History: Past Surgical History:  Procedure Laterality Date  . ABDOMINAL SURGERY    . BACK SURGERY    . COLON SURGERY    . ESOPHAGOGASTRODUODENOSCOPY (EGD) WITH PROPOFOL N/A 06/03/2016   Procedure: ESOPHAGOGASTRODUODENOSCOPY (EGD) WITH PROPOFOL;  Surgeon: [name], MD;  Location: [facility] ENDOSCOPY;  Service: Endoscopy;  Laterality: N/A;  . ilieostomy    . VENTRICULOPERITONEAL SHUNT    . vp shunt removal      Home Medications:  Allergies as of 03/21/2019      Reactions   Ivp Dye [iodinated Diagnostic Agents] Hives   Morphine And Related Nausea And Vomiting   Sulfa Antibiotics Other (See Comments)   Reaction: unknown   Latex Rash      Medication List       Accurate as of March 21, 2019 10:42 AM. If you have any questions, ask your nurse or doctor.        CENTRUM MEN PO Take 1 tablet by mouth daily.   ciprofloxacin 500 MG tablet Commonly known as: CIPRO Take 1 tablet (500 mg total) by mouth 2 (two) times daily.   feeding supplement (ENSURE ENLIVE) Liqd Take 237 mLs by mouth 2 (two)  times daily between meals.   mupirocin cream 2 % Commonly known as: Bactroban Apply to affected area 3 times daily   ondansetron 4 MG disintegrating tablet Commonly known as: ZOFRAN-ODT Take 4 mg by mouth every 8 (eight) hours as needed.   polyethylene glycol 17 g packet Commonly known as: MIRALAX / GLYCOLAX Take 17 g by mouth 2 (two) times daily.   protein supplement shake Liqd Commonly known as: PREMIER PROTEIN Take 325 mLs (11 oz total) by mouth 2 (two) times daily between meals.       Allergies:  Allergies  Allergen Reactions  . Ivp Dye [Iodinated Diagnostic Agents]  Hives  . Morphine And Related Nausea And Vomiting  . Sulfa Antibiotics Other (See Comments)    Reaction: unknown  . Latex Rash    Family History: Family History  Problem Relation Age of Onset  . Diabetes Mellitus II Father     Social History:  reports that he has never smoked. He has never used smokeless tobacco. He reports that he does not drink alcohol or use drugs.  ROS:                                        Physical Exam: There were no vitals taken for this visit.  Constitutional:  Alert and oriented, No acute distress. HEENT: [place] AT, moist mucus membranes.  Trachea midline, no masses. Cardiovascular: No clubbing, cyanosis, or edema. Respiratory: Normal respiratory effort, no increased work of breathing. GI: Abdomen is soft, nontender, nondistended, no abdominal masses GU: No CVA tenderness, RLQ urostomy - pink and viable. Urine clear. LUQ colostomy - pink and viable.  Lymph: No cervical or inguinal lymphadenopathy. Skin: No rashes, bruises or suspicious lesions. Neurologic: Grossly intact facial nerves and upper extremity function. No movement of lower extremity Psychiatric: Normal mood and affect.  Laboratory Data: Lab Results  Component Value Date   WBC 5.2 11/05/2017   HGB 11.8 (L) 11/05/2017   HCT 37.3 (L) 11/05/2017   MCV 84.3 11/05/2017   PLT 308 11/05/2017    Lab Results  Component Value Date   CREATININE 1.13 11/05/2017    No results found for: PSA  No results found for: TESTOSTERONE  Lab Results  Component Value Date   HGBA1C 5.6 04/20/2016    Urinalysis    Component Value Date/Time   COLORURINE YELLOW (A) 11/02/2017 1219   APPEARANCEUR TURBID (A) 11/02/2017 1219   LABSPEC 1.013 11/02/2017 1219   PHURINE 7.0 11/02/2017 1219   GLUCOSEU NEGATIVE 11/02/2017 1219   HGBUR SMALL (A) 11/02/2017 1219   BILIRUBINUR NEGATIVE 11/02/2017 1219   KETONESUR 5 (A) 11/02/2017 1219   PROTEINUR >=300 (A) 11/02/2017 1219   NITRITE  NEGATIVE 11/02/2017 1219   LEUKOCYTESUR MODERATE (A) 11/02/2017 1219    Lab Results  Component Value Date   BACTERIA MANY (A) 11/02/2017    Pertinent Imaging: CT 2019 images reviewed   Results for orders placed during the hospital encounter of 10/09/16  DG Abd 1 View   Narrative CLINICAL DATA:  53 year old male with a history of fecal impaction  EXAM: ABDOMEN - 1 VIEW  COMPARISON:  None.  FINDINGS: Large formed stool within the rectum and sigmoid colon, not significantly changed from the comparison CT study.  Ostomy apparatus not well visualized on the current plain film.  Enteric contrast present within right colon, transverse colon, splenic flexure and descending colon.  Less  colonic gaseous distention than the comparison CT. Persisting gas within small bowel loops centrally.  Bilateral renal calculi are not well characterized on the current plain film with overlapping enteric contrast.  Re- demonstration of scoliosis, spinal dysraphism/diastematomyelia.  Re- demonstration of left hip dysplasia and chronic changes.  Bases of the lungs unremarkable.  No radiopaque foreign body.  IMPRESSION: Large formed stool burden within the rectum and distal sigmoid colon, similar to the comparison study, compatible with persisting fecal impaction.  Since the prior CT there has been decreased gaseous distention of the more proximal colon, with enteric contrast in the right colon, transverse colon, splenic flexure.  Plain film demonstration of split cord/diastematomyelia, spinal dysraphism, scoliosis, hip dysplasia, all previously imaged on CT.  Bilateral nephrolithiasis is not well demonstrated on the current plain film, partially obscured by enteric contrast.  Signed,  Dulcy Fanny. Earleen Newport, DO  [facility]  [facility]   Electronically Signed   By: Corrie Mckusick D.O.   On: 10/11/2016 07:51    No results found for  this or any previous visit. No results found for this or any previous visit. No results found for this or any previous visit. No results found for this or any previous visit. No results found for this or any previous visit. No results found for this or any previous visit. Results for orders placed during the hospital encounter of 11/02/17  CT RENAL STONE STUDY   Narrative CLINICAL DATA:  Vomiting and diarrhea since Monday, generalized abdominal cramping  EXAM: CT ABDOMEN AND PELVIS WITHOUT CONTRAST  TECHNIQUE: Multidetector CT imaging of the abdomen and pelvis was performed following the standard protocol without IV contrast. Sagittal and coronal MPR images reconstructed from axial data set. Oral contrast was not administered.  COMPARISON:  05/07/2017  FINDINGS: Lower chest: Lung bases clear  Hepatobiliary: Gallbladder and liver unremarkable  Pancreas: Normal appearance  Spleen: Small, otherwise unremarkable  Adrenals/Urinary Tract: Adrenal glands normal appearance. Numerous large calculi within RIGHT kidney including a staghorn calculus at the upper to mid RIGHT kidney unchanged. Significant decrease in calculi within LEFT kidney since the previous exam though LEFT hydronephrosis persists. Bladder decompressed. Ostomy in RIGHT lower quadrant appears to be urostomy with the BILATERAL mid ureters deviating towards the ostomy rather than towards the bladder.  Stomach/Bowel: Markedly increased stool within the rectum and sigmoid colon with rectal wall thickening compatible with stercoral colitis. Additional ostomy in the LEFT lower quadrant, appears to be a sigmoid loop colostomy. Stomach is distended by fluid and air. Dilated proximal small bowel loops in the upper abdomen up to 5.6 cm diameter with decompressed distal small bowel loops consistent with small bowel obstruction. Question transition zone in the RIGHT mid abdomen. No definite small bowel wall thickening.  Proximal, transverse and descending colon decompressed.  Vascular/Lymphatic: Aorta normal caliber.  No definite adenopathy.  Reproductive: N/A  Other: No free air or free fluid.  No hernia.  Musculoskeletal: Diffusely demineralized with thoracolumbar dysraphic is features and scoliosis. Degenerative changes of both hip joints much greater on LEFT, with LEFT hip joint effusion present.  IMPRESSION: Dilated proximal and decompressed distal small bowel loops compatible with small bowel obstruction, with transition zone in the RIGHT mid abdomen.  Double-barrel colostomy in the LEFT lower quadrant with markedly increased stool in the distal limb/rectosigmoid colon associated with rectal wall thickening consistent with stercoral colitis.  Apparent urinary diversion in the RIGHT lower quadrant with persistent LEFT hydronephrosis and numerous BILATERAL renal calculi including staghorn  calculi in the RIGHT kidney; significant interval decrease in stone burden within the LEFT kidney.  Extensive spinal dysraphism consistent with spina bifida.   Electronically Signed   By: Lavonia Dana M.D.   On: 11/02/2017 13:48     Assessment & Plan:    H/o ileal conduit, kidney stones, hydronephrosis - UA was sent from bag, BMP sent but two lab personnel could not get a blood draw. We discussed imaging - KUB and renal US with possible CT or go straight to CT. He elected for renal US and KUB. He may need referral back to [facility] for management of hydronephrosis and stones.   No follow-ups on file.  Festus Aloe, MD   Addendum: KUB with bilateral large stones, possibly stable on right. Left is the larger kidney and has hydro, too. Will refer back to [facility] for evaluation and treatment.   [facility] [address], [place], [place] [address] [phone]

## 2019-03-21 NOTE — Patient Instructions (Signed)
Kidney Stones  Kidney stones (urolithiasis) are rock-like masses that form inside of the kidneys. Kidneys are organs that make pee (urine). A kidney stone can cause very bad pain and can block the flow of pee. The stone usually leaves your body (passes) through your pee. You may need to have a doctor take out the stone. Follow these instructions at home: Eating and drinking  Drink enough fluid to keep your pee clear or pale yellow. This will help you pass the stone.  If told by your doctor, change the foods you eat (your diet). This may include: ? Limiting how much salt (sodium) you eat. ? Eating more fruits and vegetables. ? Limiting how much meat, poultry, fish, and eggs you eat.  Follow instructions from your doctor about eating or drinking restrictions. General instructions  Collect pee samples as told by your doctor. You may need to collect a pee sample: ? 24 hours after a stone comes out. ? 8-12 weeks after a stone comes out, and every 6-12 months after that.  Strain your pee every time you pee (urinate), for as long as told. Use the strainer that your doctor recommends.  Do not throw out the stone. Keep it so that it can be tested by your doctor.  Take over-the-counter and prescription medicines only as told by your doctor.  Keep all follow-up visits as told by your doctor. This is important. You may need follow-up tests. Preventing kidney stones To prevent another kidney stone:  Drink enough fluid to keep your pee clear or pale yellow. This is the best way to prevent kidney stones.  Eat healthy foods.  Avoid certain foods as told by your doctor. You may be told to eat less protein.  Stay at a healthy weight. Contact a doctor if:  You have pain that gets worse or does not get better with medicine. Get help right away if:  You have a fever or chills.  You get very bad pain.  You get new pain in your belly (abdomen).  You pass out (faint).  You cannot pee. This  information is not intended to replace advice given to you by your health care provider. Make sure you discuss any questions you have with your health care provider. Document Released: 03/08/2008 Document Revised: 06/08/2016 Document Reviewed: 06/08/2016 Elsevier Interactive Patient Education  2019 Elsevier Inc.  

## 2019-03-25 LAB — CULTURE, URINE COMPREHENSIVE

## 2019-03-26 ENCOUNTER — Ambulatory Visit: Payer: Medicare Other | Admitting: Urology

## 2019-03-30 ENCOUNTER — Ambulatory Visit: Admission: RE | Admit: 2019-03-30 | Payer: Medicare Other | Source: Ambulatory Visit | Admitting: Urology

## 2019-03-30 ENCOUNTER — Ambulatory Visit: Payer: Medicare Other

## 2019-03-30 ENCOUNTER — Ambulatory Visit
Admission: RE | Admit: 2019-03-30 | Discharge: 2019-03-30 | Disposition: A | Discharge status: Home / Self Care | Payer: Medicare Other | Source: Ambulatory Visit | Attending: Urology | Admitting: Urology

## 2019-03-30 ENCOUNTER — Other Ambulatory Visit: Payer: Self-pay

## 2019-03-30 DIAGNOSIS — N1339 Other hydronephrosis: Secondary | ICD-10-CM | POA: Insufficient documentation

## 2019-03-30 DIAGNOSIS — N200 Calculus of kidney: Secondary | ICD-10-CM | POA: Insufficient documentation

## 2019-04-04 ENCOUNTER — Telehealth: Payer: Self-pay | Admitting: Family Medicine

## 2019-04-04 NOTE — Addendum Note (Signed)
Addended by: Festus Aloe R on: 04/04/2019 11:49 AM   Modules accepted: Orders

## 2019-04-04 NOTE — Telephone Encounter (Signed)
Patient notified and voiced understanding.

## 2019-04-04 NOTE — Telephone Encounter (Signed)
-----   Message from Festus Aloe, MD sent at 04/04/2019 11:49 AM EDT ----- Notify patient his KUB and US show stones in both kidneys and swelling of the left kidney -- I placed a referral back to Dr. Rosana Hoes Viprakasit at [facility] who did patient's stone treatment in 2018.  ----- Message ----- From: Kyra Manges, CMA Sent: 04/02/2019   8:42 AM EDT To: Festus Aloe, MD   ----- Message ----- From: Interface, Rad Results In Sent: 03/30/2019  11:53 PM EDT To: [facility]

## 2019-04-23 ENCOUNTER — Encounter: Payer: Self-pay | Admitting: Emergency Medicine

## 2019-04-23 ENCOUNTER — Inpatient Hospital Stay
Admission: EM | Admit: 2019-04-23 | Discharge: 2019-04-29 | DRG: 871 | Disposition: A | Discharge status: Home / Self Care | Payer: Medicare Other | Attending: Specialist | Admitting: Specialist

## 2019-04-23 ENCOUNTER — Other Ambulatory Visit
Admission: RE | Admit: 2019-04-23 | Discharge: 2019-04-23 | Disposition: A | Discharge status: Home / Self Care | Payer: Medicare Other | Source: Ambulatory Visit | Attending: Gastroenterology | Admitting: Gastroenterology

## 2019-04-23 ENCOUNTER — Other Ambulatory Visit: Payer: Self-pay

## 2019-04-23 DIAGNOSIS — Z91040 Latex allergy status: Secondary | ICD-10-CM

## 2019-04-23 DIAGNOSIS — N390 Urinary tract infection, site not specified: Secondary | ICD-10-CM

## 2019-04-23 DIAGNOSIS — G8220 Paraplegia, unspecified: Secondary | ICD-10-CM | POA: Diagnosis present

## 2019-04-23 DIAGNOSIS — Z993 Dependence on wheelchair: Secondary | ICD-10-CM

## 2019-04-23 DIAGNOSIS — E870 Hyperosmolality and hypernatremia: Secondary | ICD-10-CM | POA: Diagnosis present

## 2019-04-23 DIAGNOSIS — N319 Neuromuscular dysfunction of bladder, unspecified: Secondary | ICD-10-CM | POA: Diagnosis present

## 2019-04-23 DIAGNOSIS — A419 Sepsis, unspecified organism: Principal | ICD-10-CM | POA: Diagnosis present

## 2019-04-23 DIAGNOSIS — Z7401 Bed confinement status: Secondary | ICD-10-CM

## 2019-04-23 DIAGNOSIS — E871 Hypo-osmolality and hyponatremia: Secondary | ICD-10-CM | POA: Diagnosis present

## 2019-04-23 DIAGNOSIS — N136 Pyonephrosis: Secondary | ICD-10-CM | POA: Diagnosis present

## 2019-04-23 DIAGNOSIS — Z79899 Other long term (current) drug therapy: Secondary | ICD-10-CM

## 2019-04-23 DIAGNOSIS — Z1159 Encounter for screening for other viral diseases: Secondary | ICD-10-CM | POA: Insufficient documentation

## 2019-04-23 DIAGNOSIS — R112 Nausea with vomiting, unspecified: Secondary | ICD-10-CM

## 2019-04-23 DIAGNOSIS — E860 Dehydration: Secondary | ICD-10-CM | POA: Diagnosis present

## 2019-04-23 DIAGNOSIS — Z933 Colostomy status: Secondary | ICD-10-CM

## 2019-04-23 DIAGNOSIS — E876 Hypokalemia: Secondary | ICD-10-CM | POA: Diagnosis present

## 2019-04-23 DIAGNOSIS — Q059 Spina bifida, unspecified: Secondary | ICD-10-CM

## 2019-04-23 DIAGNOSIS — K264 Chronic or unspecified duodenal ulcer with hemorrhage: Secondary | ICD-10-CM | POA: Diagnosis present

## 2019-04-23 DIAGNOSIS — Z87440 Personal history of urinary (tract) infections: Secondary | ICD-10-CM

## 2019-04-23 DIAGNOSIS — Z20828 Contact with and (suspected) exposure to other viral communicable diseases: Secondary | ICD-10-CM | POA: Diagnosis present

## 2019-04-23 DIAGNOSIS — K210 Gastro-esophageal reflux disease with esophagitis: Secondary | ICD-10-CM | POA: Diagnosis present

## 2019-04-23 LAB — URINALYSIS, COMPLETE (UACMP) WITH MICROSCOPIC
Bilirubin Urine: NEGATIVE
Glucose, UA: NEGATIVE mg/dL
Ketones, ur: 5 mg/dL — AB
Nitrite: NEGATIVE
Protein, ur: 100 mg/dL — AB
Specific Gravity, Urine: 1.009 (ref 1.005–1.030)
WBC, UA: >50 WBC/hpf — ABNORMAL HIGH (ref 0–5)
pH: 7 (ref 5.0–8.0)

## 2019-04-23 LAB — CBC
HCT: 41.7 % (ref 39.0–52.0)
Hemoglobin: 13 g/dL (ref 13.0–17.0)
MCH: 27 pg (ref 26.0–34.0)
MCHC: 31.2 g/dL (ref 30.0–36.0)
MCV: 86.5 fL (ref 80.0–100.0)
Platelets: 345 10*3/uL (ref 150–400)
RBC: 4.82 MIL/uL (ref 4.22–5.81)
RDW: 14.3 % (ref 11.5–15.5)
WBC: 14.4 10*3/uL — ABNORMAL HIGH (ref 4.0–10.5)
nRBC: 0 % (ref 0.0–0.2)

## 2019-04-23 LAB — BASIC METABOLIC PANEL
Anion gap: 12 (ref 5–15)
BUN: 24 mg/dL — ABNORMAL HIGH (ref 6–20)
CO2: 19 mmol/L — ABNORMAL LOW (ref 22–32)
Calcium: 8.9 mg/dL (ref 8.9–10.3)
Chloride: 105 mmol/L (ref 98–111)
Creatinine, Ser: 1.25 mg/dL — ABNORMAL HIGH (ref 0.61–1.24)
GFR calc Af Amer: >60 mL/min (ref >60)
GFR calc non Af Amer: >60 mL/min (ref >60)
Glucose, Bld: 111 mg/dL — ABNORMAL HIGH (ref 70–99)
Potassium: 3.6 mmol/L (ref 3.5–5.1)
Sodium: 136 mmol/L (ref 135–145)

## 2019-04-23 LAB — LACTIC ACID, PLASMA: Lactic Acid, Venous: 0.9 mmol/L (ref 0.5–1.9)

## 2019-04-23 LAB — SARS CORONAVIRUS 2 (TAT 6-24 HRS): SARS Coronavirus 2: NEGATIVE

## 2019-04-23 LAB — SARS CORONAVIRUS 2 BY RT PCR (HOSPITAL ORDER, PERFORMED IN ~~LOC~~ HOSPITAL LAB): SARS Coronavirus 2: NEGATIVE

## 2019-04-23 LAB — TROPONIN I (HIGH SENSITIVITY): Troponin I (High Sensitivity): 5 ng/L (ref <18)

## 2019-04-23 MED ORDER — ACETAMINOPHEN 10 MG/ML IV SOLN
1000.0000 mg | Freq: Once | INTRAVENOUS | Status: AC
  Administered 2019-04-23: 1000 mg via INTRAVENOUS
  Filled 2019-04-23: qty 100

## 2019-04-23 MED ORDER — SODIUM CHLORIDE 0.9 % IV SOLN
1.0000 g | Freq: Once | INTRAVENOUS | Status: AC
  Administered 2019-04-23: 1 g via INTRAVENOUS
  Filled 2019-04-23: qty 10

## 2019-04-23 MED ORDER — SODIUM CHLORIDE 0.9 % IV BOLUS
1000.0000 mL | Freq: Once | INTRAVENOUS | Status: AC
  Administered 2019-04-23: 1000 mL via INTRAVENOUS

## 2019-04-23 NOTE — ED Notes (Signed)
Urine collected from urostomy bag which was changed yesterday.

## 2019-04-23 NOTE — ED Triage Notes (Signed)
Pt arrived via EMS from home where pt lives with POA sister. Pt called out due to nausea, abdominal pain and generalized weakness x1 day. Pt has hx/o spina bifida and is wheelchair bound. Pt has colostomy and urostomy bag in place.

## 2019-04-23 NOTE — ED Provider Notes (Addendum)
[facility] Emergency Department Provider Note  ____________________________________________   I have reviewed the triage vital signs and the nursing notes. Where available I have reviewed prior notes and, if possible and indicated, outside hospital notes.    HISTORY  Chief Complaint Weakness    HPI [name] is a 53 y.o. male  With a history of decubitus ulcers renal disorder spina bifida SBO in the past, who has a urostomy and a colonoscopy wheelchair-bound, states he gets frequent urinary tract infections, he states he began to feel somewhat weak a few hours ago and felt that a UTI or other infectious process was going on.  Urine is cloudy.  No cough no abdominal pain no fever no notes.  No other complaints no vomiting.  No change in his colostomy output.     Past Medical History:  Diagnosis Date  . Decubitus ulcer   . Renal disorder   . Spina bifida [facility])     Patient Active Problem List   Diagnosis Date Noted  . SBO (small bowel obstruction) ([place]) 11/02/2017  . Abdominal distention 10/11/2016  . Distal intestinal obstruction syndrome ([place]) 10/11/2016  . Hypokalemia 10/11/2016  . Leukocytosis 10/11/2016  . Loose stools 10/11/2016  . Fecal impaction in rectum ([place]) 10/10/2016  . Nausea & vomiting 06/03/2016  . Gastric outlet obstruction 06/03/2016  . SIRS (systemic inflammatory response syndrome) ([place]) 06/03/2016  . Pressure ulcer 06/03/2016  . Impaction of the bowels ([place])   . Gastritis   . Ulcer of esophagus with bleeding   . Nausea with vomiting   . Nausea vomiting and diarrhea   . Sepsis ([place]) 04/21/2016    Past Surgical History:  Procedure Laterality Date  . ABDOMINAL SURGERY    . BACK SURGERY    . COLON SURGERY    . ESOPHAGOGASTRODUODENOSCOPY (EGD) WITH PROPOFOL N/A 06/03/2016   Procedure: ESOPHAGOGASTRODUODENOSCOPY (EGD) WITH PROPOFOL;  Surgeon: [name], MD;  Location: [facility] ENDOSCOPY;  Service: Endoscopy;  Laterality:  N/A;  . ilieostomy    . VENTRICULOPERITONEAL SHUNT    . vp shunt removal      Prior to Admission medications   Medication Sig Start Date End Date Taking? Authorizing Provider  Multiple Vitamins-Minerals (CENTRUM MEN PO) Take 1 tablet by mouth daily.    Yes [provider]  pantoprazole (PROTONIX) 40 MG tablet Take 40 mg by mouth daily. 04/22/19  Yes [provider]  ondansetron (ZOFRAN) 4 MG tablet Take 4 mg by mouth every 8 (eight) hours as needed for nausea. 04/23/19 04/30/19  [provider]  oxyCODONE (OXY IR/ROXICODONE) 5 MG immediate release tablet Take 5 mg by mouth every 4 (four) hours as needed for pain. 04/10/19   [provider]  protein supplement shake (PREMIER PROTEIN) LIQD Take 325 mLs (11 oz total) by mouth 2 (two) times daily between meals. 11/05/17   Epifanio Lesches, MD    Allergies Ivp dye [iodinated diagnostic agents], Morphine and related, Sulfa antibiotics, and Latex  Family History  Problem Relation Age of Onset  . Diabetes Mellitus II Father     Social History Social History   Tobacco Use  . Smoking status: Never Smoker  . Smokeless tobacco: Never Used  Substance Use Topics  . Alcohol use: No  . Drug use: No    Review of Systems Constitutional: No fever/chills Eyes: No visual changes. ENT: No sore throat. No stiff neck no neck pain Cardiovascular: Denies chest pain. Respiratory: Denies shortness of breath. Gastrointestinal:   no vomiting.  No diarrhea.  No constipation. Genitourinary: Negative for dysuria. Musculoskeletal: Negative lower extremity swelling Skin: Negative for rash. Neurological: Negative for severe headaches, focal weakness or numbness.   ____________________________________________   PHYSICAL EXAM:  VITAL SIGNS: ED Triage Vitals  Enc Vitals Group     BP 04/23/19 1951 (!) 145/72     Pulse Rate 04/23/19 1951 (!) 120     Resp 04/23/19 1951 19     Temp 04/23/19 1951 98 F (36.7 C)      Temp Source 04/23/19 1952 Axillary     SpO2 04/23/19 1951 92 %     Weight --      Height --      Head Circumference --      Peak Flow --      Pain Score --      Pain Loc --      Pain Edu? --      Excl. in [place]? --     Constitutional: Alert and oriented.  [place] ill-appearing but in no acute distress at this moment Eyes: Conjunctivae are normal Head: Atraumatic HEENT: No congestion/rhinnorhea. Mucous membranes are moist.  Oropharynx non-erythematous Neck:   Nontender with no meningismus, no masses, no stridor Cardiovascular: Tachycardia noted regular rhythm. Grossly normal heart sounds.  Good peripheral circulation. Respiratory: Normal respiratory effort.  No retractions. Lungs CTAB. Abdominal: Soft and nontender. No distention. No guarding no rebound urostomy is draining cloudy urine, colonoscopy is draining normal-appearing stool Back:  There is no focal tenderness or step off.  there is no CVA tenderness Musculoskeletal: No lower extremity tenderness, no upper extremity tenderness. No joint effusions, no DVT signs strong distal pulses no edema Neurologic: Slight disconjugate gaze noted, normal speech, changes especially with spina bifida noted. Skin:  . No rash noted. Psychiatric: Mood and affect are normal. Speech and behavior are normal.  ____________________________________________   LABS (all labs ordered are listed, but only abnormal results are displayed)  Labs Reviewed  BASIC METABOLIC PANEL - Abnormal; Notable for the following components:      Result Value   CO2 19 (*)    Glucose, Bld 111 (*)    BUN 24 (*)    Creatinine, Ser 1.25 (*)    All other components within normal limits  CBC - Abnormal; Notable for the following components:   WBC 14.4 (*)    All other components within normal limits  CULTURE, BLOOD (ROUTINE X 2)  CULTURE, BLOOD (ROUTINE X 2)  SARS CORONAVIRUS 2 (HOSPITAL ORDER, [place] [facility])  URINE CULTURE  LACTIC ACID, PLASMA   URINALYSIS, COMPLETE (UACMP) WITH MICROSCOPIC  URINALYSIS, COMPLETE (UACMP) WITH MICROSCOPIC  LACTIC ACID, PLASMA  CBG MONITORING, ED  TROPONIN I (HIGH SENSITIVITY)    Pertinent labs  results that were available during my care of the patient were reviewed by me and considered in my medical decision making (see chart for details). ____________________________________________  EKG  I personally interpreted any EKGs ordered by me or triage  ____________________________________________  RADIOLOGY  Pertinent labs & imaging results that were available during my care of the patient were reviewed by me and considered in my medical decision making (see chart for details). If possible, patient and/or family made aware of any abnormal findings.  No results found. ____________________________________________    PROCEDURES  Procedure(s) performed: None  Procedures  Critical Care performed: None  ____________________________________________   INITIAL IMPRESSION / ASSESSMENT AND PLAN / ED COURSE  Pertinent labs & imaging results that were available  during my care of the patient were reviewed by me and considered in my medical decision making (see chart for details).  Patient here with feeling generally weak and concerned about urinary tract infection urine is cloudy, white count is up at 14.4, lactic acid is negative but he is febrile tachycardic we are giving him IV fluids.  I do not think that significant IV hydration is indicated given his lactic of 0.9.  We are giving him antipyretics, and we will give him antibiotics after review of his prior cultures, urinary tract infection is the most likely etiology and it is sensitive to Rocephin so we will start with that.  Abdomen is nonsurgical certainly with no tenderness, and no sign of pneumonia clinically although we will obtain chest x-ray and reassess   ____________________________________________   FINAL CLINICAL IMPRESSION(S) / ED  DIAGNOSES  Final diagnoses:  None      This chart was dictated using voice recognition software.  Despite best efforts to proofread,  errors can occur which can change meaning.      Schuyler Amor, MD 04/23/19 2046    Schuyler Amor, MD 04/23/19 [id number]

## 2019-04-24 ENCOUNTER — Other Ambulatory Visit: Payer: Self-pay

## 2019-04-24 DIAGNOSIS — G8220 Paraplegia, unspecified: Secondary | ICD-10-CM | POA: Diagnosis present

## 2019-04-24 DIAGNOSIS — Z87440 Personal history of urinary (tract) infections: Secondary | ICD-10-CM | POA: Diagnosis not present

## 2019-04-24 DIAGNOSIS — E860 Dehydration: Secondary | ICD-10-CM | POA: Diagnosis present

## 2019-04-24 DIAGNOSIS — K210 Gastro-esophageal reflux disease with esophagitis: Secondary | ICD-10-CM | POA: Diagnosis present

## 2019-04-24 DIAGNOSIS — E870 Hyperosmolality and hypernatremia: Secondary | ICD-10-CM | POA: Diagnosis present

## 2019-04-24 DIAGNOSIS — Z79899 Other long term (current) drug therapy: Secondary | ICD-10-CM | POA: Diagnosis not present

## 2019-04-24 DIAGNOSIS — E871 Hypo-osmolality and hyponatremia: Secondary | ICD-10-CM | POA: Diagnosis present

## 2019-04-24 DIAGNOSIS — K264 Chronic or unspecified duodenal ulcer with hemorrhage: Secondary | ICD-10-CM | POA: Diagnosis present

## 2019-04-24 DIAGNOSIS — A419 Sepsis, unspecified organism: Secondary | ICD-10-CM | POA: Diagnosis present

## 2019-04-24 DIAGNOSIS — N319 Neuromuscular dysfunction of bladder, unspecified: Secondary | ICD-10-CM | POA: Diagnosis present

## 2019-04-24 DIAGNOSIS — Z7401 Bed confinement status: Secondary | ICD-10-CM | POA: Diagnosis not present

## 2019-04-24 DIAGNOSIS — K921 Melena: Secondary | ICD-10-CM | POA: Diagnosis not present

## 2019-04-24 DIAGNOSIS — Z20828 Contact with and (suspected) exposure to other viral communicable diseases: Secondary | ICD-10-CM | POA: Diagnosis present

## 2019-04-24 DIAGNOSIS — N136 Pyonephrosis: Secondary | ICD-10-CM | POA: Diagnosis present

## 2019-04-24 DIAGNOSIS — K922 Gastrointestinal hemorrhage, unspecified: Secondary | ICD-10-CM | POA: Diagnosis not present

## 2019-04-24 DIAGNOSIS — Z933 Colostomy status: Secondary | ICD-10-CM | POA: Diagnosis not present

## 2019-04-24 DIAGNOSIS — Z91040 Latex allergy status: Secondary | ICD-10-CM | POA: Diagnosis not present

## 2019-04-24 DIAGNOSIS — Z993 Dependence on wheelchair: Secondary | ICD-10-CM | POA: Diagnosis not present

## 2019-04-24 DIAGNOSIS — Q059 Spina bifida, unspecified: Secondary | ICD-10-CM | POA: Diagnosis not present

## 2019-04-24 DIAGNOSIS — E876 Hypokalemia: Secondary | ICD-10-CM | POA: Diagnosis present

## 2019-04-24 DIAGNOSIS — N390 Urinary tract infection, site not specified: Secondary | ICD-10-CM | POA: Diagnosis present

## 2019-04-24 LAB — URINE CULTURE

## 2019-04-24 LAB — BASIC METABOLIC PANEL
Anion gap: 8 (ref 5–15)
BUN: 22 mg/dL — ABNORMAL HIGH (ref 6–20)
CO2: 20 mmol/L — ABNORMAL LOW (ref 22–32)
Calcium: 8.5 mg/dL — ABNORMAL LOW (ref 8.9–10.3)
Chloride: 111 mmol/L (ref 98–111)
Creatinine, Ser: 1.13 mg/dL (ref 0.61–1.24)
GFR calc Af Amer: >60 mL/min (ref >60)
GFR calc non Af Amer: >60 mL/min (ref >60)
Glucose, Bld: 90 mg/dL (ref 70–99)
Potassium: 3.3 mmol/L — ABNORMAL LOW (ref 3.5–5.1)
Sodium: 139 mmol/L (ref 135–145)

## 2019-04-24 LAB — BLOOD CULTURE ID PANEL (REFLEXED)

## 2019-04-24 LAB — CBC
HCT: 38.6 % — ABNORMAL LOW (ref 39.0–52.0)
Hemoglobin: 11.9 g/dL — ABNORMAL LOW (ref 13.0–17.0)
MCH: 27.2 pg (ref 26.0–34.0)
MCHC: 30.8 g/dL (ref 30.0–36.0)
MCV: 88.1 fL (ref 80.0–100.0)
Platelets: 299 10*3/uL (ref 150–400)
RBC: 4.38 MIL/uL (ref 4.22–5.81)
RDW: 14.3 % (ref 11.5–15.5)
WBC: 14.9 10*3/uL — ABNORMAL HIGH (ref 4.0–10.5)
nRBC: 0.2 % (ref 0.0–0.2)

## 2019-04-24 LAB — LACTIC ACID, PLASMA
Lactic Acid, Venous: 0.8 mmol/L (ref 0.5–1.9)
Lactic Acid, Venous: 0.9 mmol/L (ref 0.5–1.9)

## 2019-04-24 MED ORDER — POTASSIUM CHLORIDE 20 MEQ PO PACK: 40.0000 meq | PACK | Freq: Once | ORAL | Status: DC

## 2019-04-24 MED ORDER — ACETAMINOPHEN 325 MG PO TABS: 650.0000 mg | ORAL_TABLET | Freq: Four times a day (QID) | ORAL | Status: DC | PRN

## 2019-04-24 MED ORDER — ONDANSETRON HCL 4 MG PO TABS: 4.0000 mg | ORAL_TABLET | Freq: Four times a day (QID) | ORAL | Status: DC | PRN

## 2019-04-24 MED ORDER — ACETAMINOPHEN 650 MG RE SUPP: 650.0000 mg | Freq: Four times a day (QID) | RECTAL | Status: DC | PRN

## 2019-04-24 MED ORDER — ONDANSETRON HCL 4 MG/2ML IJ SOLN
4.0000 mg | Freq: Once | INTRAMUSCULAR | Status: AC
  Administered 2019-04-24: 4 mg via INTRAVENOUS
  Filled 2019-04-24: qty 2

## 2019-04-24 MED ORDER — ADULT MULTIVITAMIN W/MINERALS CH
ORAL_TABLET | Freq: Every day | ORAL | Status: DC
  Administered 2019-04-29: 1 via ORAL
  Filled 2019-04-24 (×4): qty 1

## 2019-04-24 MED ORDER — ONDANSETRON HCL 4 MG/2ML IJ SOLN
4.0000 mg | Freq: Four times a day (QID) | INTRAMUSCULAR | Status: DC | PRN
  Administered 2019-04-24 – 2019-04-26 (×5): 4 mg via INTRAVENOUS
  Filled 2019-04-24 (×5): qty 2

## 2019-04-24 MED ORDER — PROCHLORPERAZINE EDISYLATE 10 MG/2ML IJ SOLN
10.0000 mg | INTRAMUSCULAR | Status: AC
  Administered 2019-04-25: 01:00:00 10 mg via INTRAVENOUS
  Filled 2019-04-24: qty 2

## 2019-04-24 MED ORDER — KETOROLAC TROMETHAMINE 30 MG/ML IJ SOLN
INTRAMUSCULAR | Status: AC
  Administered 2019-04-24: 30 mg via INTRAVENOUS
  Filled 2019-04-24: qty 1

## 2019-04-24 MED ORDER — PREMIER PROTEIN SHAKE
11.0000 [oz_av] | Freq: Two times a day (BID) | ORAL | Status: DC
  Administered 2019-04-29: 11 [oz_av] via ORAL

## 2019-04-24 MED ORDER — ONDANSETRON HCL 4 MG PO TABS: 4.0000 mg | ORAL_TABLET | Freq: Three times a day (TID) | ORAL | Status: DC | PRN

## 2019-04-24 MED ORDER — KETOROLAC TROMETHAMINE 30 MG/ML IJ SOLN
30.0000 mg | Freq: Once | INTRAMUSCULAR | Status: AC
  Administered 2019-04-24: 04:00:00 30 mg via INTRAVENOUS

## 2019-04-24 MED ORDER — SODIUM CHLORIDE 0.9 % IV SOLN
INTRAVENOUS | Status: DC
  Administered 2019-04-24 – 2019-04-27 (×8): via INTRAVENOUS

## 2019-04-24 MED ORDER — SODIUM CHLORIDE 0.9 % IV SOLN
2.0000 g | INTRAVENOUS | Status: DC
  Administered 2019-04-24 – 2019-04-28 (×5): 2 g via INTRAVENOUS
  Filled 2019-04-24: qty 20
  Filled 2019-04-24 (×5): qty 2

## 2019-04-24 MED ORDER — OXYCODONE HCL 5 MG PO TABS
5.0000 mg | ORAL_TABLET | ORAL | Status: DC | PRN
  Filled 2019-04-24: qty 1

## 2019-04-24 MED ORDER — POTASSIUM CHLORIDE 10 MEQ/100ML IV SOLN
10.0000 meq | INTRAVENOUS | Status: AC
  Administered 2019-04-24 (×2): 10 meq via INTRAVENOUS
  Filled 2019-04-24 (×2): qty 100

## 2019-04-24 MED ORDER — ENOXAPARIN SODIUM 40 MG/0.4ML ~~LOC~~ SOLN
40.0000 mg | SUBCUTANEOUS | Status: DC
  Administered 2019-04-24 – 2019-04-26 (×3): 40 mg via SUBCUTANEOUS
  Filled 2019-04-24 (×3): qty 0.4

## 2019-04-24 MED ORDER — POTASSIUM CHLORIDE CRYS ER 20 MEQ PO TBCR
20.0000 meq | EXTENDED_RELEASE_TABLET | Freq: Once | ORAL | Status: DC
  Filled 2019-04-24: qty 1

## 2019-04-24 MED ORDER — PANTOPRAZOLE SODIUM 40 MG PO TBEC
40.0000 mg | DELAYED_RELEASE_TABLET | Freq: Every day | ORAL | Status: DC
  Filled 2019-04-24 (×3): qty 1

## 2019-04-24 MED ORDER — SODIUM CHLORIDE 0.9 % IV SOLN
1.0000 g | INTRAVENOUS | Status: DC
  Filled 2019-04-24: qty 10

## 2019-04-24 MED ORDER — TRAZODONE HCL 50 MG PO TABS
25.0000 mg | ORAL_TABLET | Freq: Every evening | ORAL | Status: DC | PRN
  Administered 2019-04-27: 23:00:00 25 mg via ORAL
  Filled 2019-04-24: qty 1

## 2019-04-24 NOTE — ED Notes (Addendum)
Kennyth Lose, RN for RM 127 notified of pt status.

## 2019-04-24 NOTE — Progress Notes (Addendum)
PHARMACY - PHYSICIAN COMMUNICATION CRITICAL VALUE ALERT - BLOOD CULTURE IDENTIFICATION (BCID)  [name] is an 53 y.o. male who presented to [facility] on 04/23/2019 with a chief complaint of weakness  Assessment:  1/4 bottles(anaerobic) grew positive for GNR, bacteria not specified )  Name of physician (or Provider) Contacted: [place]  Current antibiotics: Rocephin  Changes to prescribed antibiotics recommended:  Patient is on recommended antibiotics - No changes needed  Will increase dose of Rocephin to 2g IV Q24 hours. Will await further cultures to determine if antibiotic therapy needs to be changed/adjusted.  Results for orders placed or performed during the hospital encounter of 04/23/19  Blood Culture ID Panel (Reflexed) (Collected: 04/23/2019  8:07 PM)  Result Value Ref Range   Enterococcus species NOT DETECTED NOT DETECTED   Listeria monocytogenes NOT DETECTED NOT DETECTED   Staphylococcus species NOT DETECTED NOT DETECTED   Staphylococcus aureus (BCID) NOT DETECTED NOT DETECTED   Streptococcus species NOT DETECTED NOT DETECTED   Streptococcus agalactiae NOT DETECTED NOT DETECTED   Streptococcus pneumoniae NOT DETECTED NOT DETECTED   Streptococcus pyogenes NOT DETECTED NOT DETECTED   Acinetobacter baumannii NOT DETECTED NOT DETECTED   Enterobacteriaceae species NOT DETECTED NOT DETECTED   Enterobacter cloacae complex NOT DETECTED NOT DETECTED   Escherichia coli NOT DETECTED NOT DETECTED   Klebsiella oxytoca NOT DETECTED NOT DETECTED   Klebsiella pneumoniae NOT DETECTED NOT DETECTED   Proteus species NOT DETECTED NOT DETECTED   Serratia marcescens NOT DETECTED NOT DETECTED   Haemophilus influenzae NOT DETECTED NOT DETECTED   Neisseria meningitidis NOT DETECTED NOT DETECTED   Pseudomonas aeruginosa NOT DETECTED NOT DETECTED   Candida albicans NOT DETECTED NOT DETECTED   Candida glabrata NOT DETECTED NOT DETECTED   Candida krusei NOT DETECTED NOT DETECTED   Candida  parapsilosis NOT DETECTED NOT DETECTED   Candida tropicalis NOT DETECTED NOT DETECTED    [name] 04/24/2019  1:18 PM

## 2019-04-24 NOTE — Progress Notes (Signed)
[place] [facility] [place] NAME: Voshon Petro    MR#:  [id number]  DATE OF BIRTH:  10/26/1965  SUBJECTIVE:  CHIEF COMPLAINT:   Chief Complaint  Patient presents with  . Weakness   No new complaint this morning.  Had fevers last night. REVIEW OF SYSTEMS:  Review of Systems  Constitutional: Positive for chills and fever.  HENT: Negative for hearing loss and tinnitus.   Eyes: Negative for blurred vision.  Respiratory: Negative for cough and shortness of breath.   Cardiovascular: Negative for chest pain and palpitations.  Gastrointestinal: Negative for heartburn.  Genitourinary: Negative for dysuria and urgency.  Musculoskeletal: Negative for joint pain.       Paraplegic due to history of spina bifid  Skin: Negative for itching and rash.  Neurological: Negative for dizziness and headaches.  Psychiatric/Behavioral: Negative for depression and hallucinations.    DRUG ALLERGIES:   Allergies  Allergen Reactions  . Ivp Dye [Iodinated Diagnostic Agents] Hives  . Morphine And Related Nausea And Vomiting  . Sulfa Antibiotics Other (See Comments)    Reaction: unknown  . Latex Rash   VITALS:  Blood pressure 121/72, pulse 94, temperature 99 F (37.2 C), temperature source Oral, resp. rate 16, weight 56 kg, SpO2 92 %. PHYSICAL EXAMINATION:  Physical Exam   GENERAL:  53 y.o.-year-old Caucasian male patient lying in the bed with no acute distress.  EYES: Pupils equal, round, reactive to light and accommodation. No scleral icterus. Extraocular muscles intact.  HEENT: Head atraumatic, normocephalic. Oropharynx and nasopharynx clear.  NECK:  Supple, no jugular venous distention. No thyroid enlargement, no tenderness.  LUNGS: Normal breath sounds bilaterally, no wheezing, rales,rhonchi or crepitation. No use of accessory muscles of respiration.  CARDIOVASCULAR: Regular rate and rhythm, S1, S2 normal. No murmurs, rubs, or gallops.  ABDOMEN:  Soft, nondistended, nontender. Bowel sounds present. No organomegaly or mass.  Urostomy bag in place EXTREMITIES: Patient paraplegic due to history of spina bifid.    NEUROLOGIC: He has spina bifida and bilateral knee and ankle contractures and deformities.  PSYCHIATRIC: The patient is alert and oriented x 3.  Normal affect and good eye contact. SKIN: No obvious rash, lesion, or ulcer.  LABORATORY PANEL:  Male CBC Recent Labs  Lab 04/24/19 0517  WBC 14.9*  HGB 11.9*  HCT 38.6*  PLT 299   ------------------------------------------------------------------------------------------------------------------ Chemistries  Recent Labs  Lab 04/24/19 0517  NA 139  K 3.3*  CL 111  CO2 20*  GLUCOSE 90  BUN 22*  CREATININE 1.13  CALCIUM 8.5*   RADIOLOGY:  No results found. ASSESSMENT AND PLAN:   1.  Sepsis.  This is clearly secondary to UTI.   Patient on IV antibiotics with Rocephin.  IV fluid hydration.  Follow-up on urine culture and sensitivities.  Lactic acid level normal 1 out of 4 sets of blood cultures with gram-negative rods.  Likely contaminant.  Follow-up on final results.  Could be test negative  2. Mild dehydration.  Patient will be hydrated with IV normal saline and will follow his BMP.  3.   GERD.  PPI therapy will be resumed.  4.  DVT prophylaxis.  Subcutaneous Lovenox  All the records are reviewed and case discussed with Care Management/Social Worker. Management plans discussed with the patient, family and they are in agreement.  CODE STATUS: Full Code  TOTAL TIME TAKING CARE OF THIS PATIENT: 34 minutes.   More than 50% of the time was spent in counseling/coordination  of care: YES  POSSIBLE D/C IN 2 DAYS, DEPENDING ON CLINICAL CONDITION.   Kennedie Pardoe M.D on 04/24/2019 at 1:50 PM  Between 7am to 6pm - Pager - [phone]  After 6pm go to [email or web address] - Proofreader  [facility]  Office  [phone]  CC: Primary care  physician; Idelle Crouch, MD  Note: This dictation was prepared with Dragon dictation along with smaller phrase technology. Any transcriptional errors that result from this process are unintentional.

## 2019-04-24 NOTE — ED Notes (Signed)
Pt with sudden onset of vomiting. Pts temp 102.5 Rectally. See MAR for intervention.

## 2019-04-24 NOTE — ED Notes (Signed)
ED TO INPATIENT HANDOFF REPORT  ED Nurse Name and Phone #: Lorriane Shire [phone]  S Name/Age/Gender [name] 53 y.o. male Room/Bed: ED09A/ED09A  Code Status   Code Status: Full Code  Home/SNF/Other Home Patient oriented to: self, place, time and situation Is this baseline? Yes   Triage Complete: Triage complete  Chief Complaint weakness  Triage Note Pt arrived via EMS from home where pt lives with POA sister. Pt called out due to nausea, abdominal pain and generalized weakness x1 day. Pt has hx/o spina bifida and is wheelchair bound. Pt has colostomy and urostomy bag in place.    Allergies Allergies  Allergen Reactions  . Ivp Dye [Iodinated Diagnostic Agents] Hives  . Morphine And Related Nausea And Vomiting  . Sulfa Antibiotics Other (See Comments)    Reaction: unknown  . Latex Rash    Level of Care/Admitting Diagnosis ED Disposition    ED Disposition Condition [place] [facility]: [place] [[id number]]  Level of Care: Med-Surg [16]  Covid Evaluation: Confirmed COVID Negative  Diagnosis: Sepsis [facility]) [[id number]]  Admitting Physician: Christel Mormon [[id number]]  Attending Physician: Christel Mormon [[id number]]  Estimated length of stay: 3 - 4 days  Certification:: I certify this patient will need inpatient services for at least 2 midnights  PT Class (Do Not Modify): Inpatient [101]  PT Acc Code (Do Not Modify): Private [1]       B Medical/Surgery History Past Medical History:  Diagnosis Date  . Decubitus ulcer   . Renal disorder   . Spina bifida [facility])    Past Surgical History:  Procedure Laterality Date  . ABDOMINAL SURGERY    . BACK SURGERY    . COLON SURGERY    . ESOPHAGOGASTRODUODENOSCOPY (EGD) WITH PROPOFOL N/A 06/03/2016   Procedure: ESOPHAGOGASTRODUODENOSCOPY (EGD) WITH PROPOFOL;  Surgeon: [name], MD;  Location: [facility] ENDOSCOPY;  Service: Endoscopy;  Laterality: N/A;  . ilieostomy    . VENTRICULOPERITONEAL SHUNT    . vp  shunt removal       A IV Location/Drains/Wounds Patient Lines/Drains/Airways Status   Active Line/Drains/Airways    Name:   Placement date:   Placement time:   Site:   Days:   Peripheral IV 04/23/19 Right Antecubital   04/23/19    2010    Antecubital   1   Peripheral IV 04/23/19 Left    04/23/19    2029    -   1   Ileostomy Continent (Abdominal pouch) LUQ   12/01/68    1200    LUQ   18406   Urostomy Ureterostomy right RLQ   06/03/16    0533    RLQ   1055   Urostomy RLQ   10/10/16    0234    RLQ   926   Pressure Ulcer 06/03/16 Stage III -  Full thickness tissue loss. Subcutaneous fat may be visible but bone, tendon or muscle are NOT exposed. 9.3 cm X 7 cm    06/03/16    0430     1055          Intake/Output Last 24 hours  Intake/Output Summary (Last 24 hours) at 04/24/2019 0319 Last data filed at 04/23/2019 2231 Gross per 24 hour  Intake 1100 ml  Output 100 ml  Net 1000 ml    Labs/Imaging Results for orders placed or performed during the hospital encounter of 04/23/19 (from the past 48 hour(s))  Basic metabolic panel     Status:  Abnormal   Collection Time: 04/23/19  8:07 PM  Result Value Ref Range   Sodium 136 135 - 145 mmol/L   Potassium 3.6 3.5 - 5.1 mmol/L   Chloride 105 98 - 111 mmol/L   CO2 19 (L) 22 - 32 mmol/L   Glucose, Bld 111 (H) 70 - 99 mg/dL   BUN 24 (H) 6 - 20 mg/dL   Creatinine, Ser 1.25 (H) 0.61 - 1.24 mg/dL   Calcium 8.9 8.9 - 10.3 mg/dL   GFR calc non Af Amer >60 >60 mL/min   GFR calc Af Amer >60 >60 mL/min   Anion gap 12 5 - 15    Comment: Performed at [facility], [address] [place], [place] [address]  CBC     Status: Abnormal   Collection Time: 04/23/19  8:07 PM  Result Value Ref Range   WBC 14.4 (H) 4.0 - 10.5 K/uL   RBC 4.82 4.22 - 5.81 MIL/uL   Hemoglobin 13.0 13.0 - 17.0 g/dL   HCT 41.7 39.0 - 52.0 %   MCV 86.5 80.0 - 100.0 fL   MCH 27.0 26.0 - 34.0 pg   MCHC 31.2 30.0 - 36.0 g/dL   RDW 14.3 11.5 - 15.5 %   Platelets 345 150  - 400 K/uL   nRBC 0.0 0.0 - 0.2 %    Comment: Performed at [facility], [place], [place] [address]  Troponin I (High Sensitivity)     Status: None   Collection Time: 04/23/19  8:07 PM  Result Value Ref Range   Troponin I (High Sensitivity) 5 <18 ng/L    Comment: (NOTE) Elevated high sensitivity troponin I (hsTnI) values and significant  changes across serial measurements may suggest ACS but many other  chronic and acute conditions are known to elevate hsTnI results.  Refer to the "Links" section for chest pain algorithms and additional  guidance. Performed at [facility], [address] [place], [place] [address]   Lactic acid, plasma     Status: None   Collection Time: 04/23/19  8:07 PM  Result Value Ref Range   Lactic Acid, Venous 0.9 0.5 - 1.9 mmol/L    Comment: Performed at [facility], [address] [place], [place] [address]  SARS Coronavirus 2 (CEPHEID- Performed in [place] hospital lab), Hosp Order     Status: None   Collection Time: 04/23/19  8:07 PM   Specimen: Nasopharyngeal Swab  Result Value Ref Range   SARS Coronavirus 2 NEGATIVE NEGATIVE    Comment: (NOTE) If result is NEGATIVE SARS-CoV-2 target nucleic acids are NOT DETECTED. The SARS-CoV-2 RNA is generally detectable in upper and lower  respiratory specimens during the acute phase of infection. The lowest  concentration of SARS-CoV-2 viral copies this assay can detect is 250  copies / mL. A negative result does not preclude SARS-CoV-2 infection  and should not be used as the sole basis for treatment or other  patient management decisions.  A negative result may occur with  improper specimen collection / handling, submission of specimen other  than nasopharyngeal swab, presence of viral mutation(s) within the  areas targeted by this assay, and inadequate number of viral copies  (<250 copies / mL). A negative result must be combined with clinical   observations, patient history, and epidemiological information. If result is POSITIVE SARS-CoV-2 target nucleic acids are DETECTED. The SARS-CoV-2 RNA is generally detectable in upper and lower  respiratory specimens dur ing the  acute phase of infection.  Positive  results are indicative of active infection with SARS-CoV-2.  Clinical  correlation with patient history and other diagnostic information is  necessary to determine patient infection status.  Positive results do  not rule out bacterial infection or co-infection with other viruses. If result is PRESUMPTIVE POSTIVE SARS-CoV-2 nucleic acids MAY BE PRESENT.   A presumptive positive result was obtained on the submitted specimen  and confirmed on repeat testing.  While 2019 novel coronavirus  (SARS-CoV-2) nucleic acids may be present in the submitted sample  additional confirmatory testing may be necessary for epidemiological  and / or clinical management purposes  to differentiate between  SARS-CoV-2 and other Sarbecovirus currently known to infect humans.  If clinically indicated additional testing with an alternate test  methodology [id number]) is advised. The SARS-CoV-2 RNA is generally  detectable in upper and lower respiratory sp ecimens during the acute  phase of infection. The expected result is Negative. Fact Sheet for Patients:  StrictlyIdeas.no Fact Sheet for Healthcare Providers: BankingDealers.co.za This test is not yet approved or cleared by the [place] FDA and has been authorized for detection and/or diagnosis of SARS-CoV-2 by FDA under an Emergency Use Authorization (EUA).  This EUA will remain in effect (meaning this test can be used) for the duration of the COVID-19 declaration under Section 564(b)(1) of the Act, 21 U.S.C. section 360bbb-3(b)(1), unless the authorization is terminated or revoked sooner. Performed at [facility], [address] [place], [place] [address]   Urinalysis, Complete w Microscopic     Status: Abnormal   Collection Time: 04/23/19  9:07 PM  Result Value Ref Range   Color, Urine YELLOW (A) YELLOW   APPearance CLOUDY (A) CLEAR   Specific Gravity, Urine 1.009 1.005 - 1.030   pH 7.0 5.0 - 8.0   Glucose, UA NEGATIVE NEGATIVE mg/dL   Hgb urine dipstick MODERATE (A) NEGATIVE   Bilirubin Urine NEGATIVE NEGATIVE   Ketones, ur 5 (A) NEGATIVE mg/dL   Protein, ur 100 (A) NEGATIVE mg/dL   Nitrite NEGATIVE NEGATIVE   Leukocytes,Ua LARGE (A) NEGATIVE   RBC / HPF 21-50 0 - 5 RBC/hpf   WBC, UA >50 (H) 0 - 5 WBC/hpf   Bacteria, UA MANY (A) NONE SEEN   Squamous Epithelial / LPF 0-5 0 - 5   WBC Clumps PRESENT     Comment: Performed at [facility], [address] [place], [place] [address]   No results found.  Pending Labs Unresulted Labs (From admission, onward)    Start     Ordered   05/01/19 0500  Creatinine, serum  (enoxaparin (LOVENOX)    CrCl >/= 30 ml/min)  Weekly,   STAT    Comments: while on enoxaparin therapy    04/24/19 0036   04/24/19 5366  Basic metabolic panel  Tomorrow morning,   STAT     04/24/19 0036   04/24/19 0500  CBC  Tomorrow morning,   STAT     04/24/19 0036   04/24/19 0217  Lactic acid, plasma  STAT Now then every 3 hours,   STAT     04/24/19 0216   04/24/19 0034  HIV antibody (Routine Testing)  Once,   STAT     04/24/19 0036   04/23/19 2015  Urine culture  Add-on,   AD     04/23/19 2014   04/23/19 1954  Lactic acid, plasma  Now then every 2 hours,   STAT     04/23/19  1953   04/23/19 1954  Blood culture (routine x 2)  BLOOD CULTURE X 2,   STAT     04/23/19 1953          Vitals/Pain Today's Vitals   04/23/19 2230 04/23/19 2230 04/23/19 2300 04/24/19 0228  BP: 125/74 125/74 109/79   Pulse: 97 99 91   Resp: 12 14 15    Temp:  98.8 F (37.1 C)    TempSrc:  Axillary    SpO2: 95% 93% 95%   Weight:    52.6 kg  PainSc:  0-No pain      Isolation Precautions No  active isolations  Medications Medications  enoxaparin (LOVENOX) injection 40 mg (has no administration in time range)  0.9 %  sodium chloride infusion (has no administration in time range)  acetaminophen (TYLENOL) tablet 650 mg (has no administration in time range)    Or  acetaminophen (TYLENOL) suppository 650 mg (has no administration in time range)  traZODone (DESYREL) tablet 25 mg (has no administration in time range)  ondansetron (ZOFRAN) tablet 4 mg (has no administration in time range)    Or  ondansetron (ZOFRAN) injection 4 mg (has no administration in time range)  cefTRIAXone (ROCEPHIN) 1 g in sodium chloride 0.9 % 100 mL IVPB (has no administration in time range)  oxyCODONE (Oxy IR/ROXICODONE) immediate release tablet 5 mg (has no administration in time range)  pantoprazole (PROTONIX) EC tablet 40 mg (has no administration in time range)  multivitamin with minerals tablet (has no administration in time range)  protein supplement (PREMIER PROTEIN) liquid - approved for s/p bariatric surgery (has no administration in time range)  potassium chloride (KLOR-CON) packet 40 mEq (has no administration in time range)  acetaminophen (OFIRMEV) IV 1,000 mg (0 mg Intravenous Stopped 04/23/19 2034)  sodium chloride 0.9 % bolus 1,000 mL (0 mLs Intravenous Stopped 04/23/19 2231)  cefTRIAXone (ROCEPHIN) 1 g in sodium chloride 0.9 % 100 mL IVPB (0 g Intravenous Stopped 04/23/19 2109)    Mobility non-ambulatory High fall risk   Focused Assessments SEPSIS    R Recommendations: See Admitting Provider Note  Report given to:   Additional Notes:

## 2019-04-24 NOTE — H&P (Signed)
[place] [facility] [place] NAME: [name]    MR#:  [id number]  DATE OF BIRTH:  [birth year]  DATE OF ADMISSION:  04/23/2019  PRIMARY CARE PHYSICIAN: Idelle Crouch, MD   REQUESTING/REFERRING PHYSICIAN: Charlotte Crumb, MD. CHIEF COMPLAINT:   Chief Complaint  Patient presents with  . Weakness    HISTORY OF PRESENT ILLNESS:  [name]  is a 53 y.o. Caucasian male with a known history of spina bifida, cubitus ulcers and previous history of UTIs, who presented to the emergency room with an onset of malaise and nausea.  He noticed his urine has been darker than usual in his urine bag.  He stated that he is always foul-smelling.  No dyspnea or cough or wheezing or fever or at home he had chills however.  He was noted to have fever in the ER.  No chest pain or palpitations.  No recent sick exposures.  Upon presentation to the emergency room, extension was 102.1 axillary and 102.9 rect pressure 145/72 with a pulse symmetry of 92% and later 95% on room air.  Labs were remarkable for a BUN of 24 and creatinine 1.25 with a CO2 of 19 and leukocytosis of 14.4.  His COVID-19 test came back negative.  Urinalysis was positive for UTI.  The patient was given a gram of IV Tylenol as well as 1 L bolus of IV normal saline and IV Rocephin.  He will be admitted to a medically monitored bed for further evaluation and management. PAST MEDICAL HISTORY:   Past Medical History:  Diagnosis Date  . Decubitus ulcer   . Renal disorder   . Spina bifida ([place])   GERD, history of small bowel obstruction and C. difficile colitis. Urolithiasis History of UTIs  PAST SURGICAL HISTORY:   Past Surgical History:  Procedure Laterality Date  . ABDOMINAL SURGERY    . BACK SURGERY    . COLON SURGERY    . ESOPHAGOGASTRODUODENOSCOPY (EGD) WITH PROPOFOL N/A 06/03/2016   Procedure: ESOPHAGOGASTRODUODENOSCOPY (EGD) WITH PROPOFOL;  Surgeon: [name], MD;  Location:  [facility] ENDOSCOPY;  Service: Endoscopy;  Laterality: N/A;  . ilieostomy    . VENTRICULOPERITONEAL SHUNT    . vp shunt removal      SOCIAL HISTORY:   Social History   Tobacco Use  . Smoking status: Never Smoker  . Smokeless tobacco: Never Used  Substance Use Topics  . Alcohol use: No    FAMILY HISTORY:   Family History  Problem Relation Age of Onset  . Diabetes Mellitus II Father     DRUG ALLERGIES:   Allergies  Allergen Reactions  . Ivp Dye [Iodinated Diagnostic Agents] Hives  . Morphine And Related Nausea And Vomiting  . Sulfa Antibiotics Other (See Comments)    Reaction: unknown  . Latex Rash    REVIEW OF SYSTEMS:   ROS As per history of present illness. All pertinent systems were reviewed above. Constitutional,  HEENT, cardiovascular, respiratory, GI, GU, musculoskeletal, neuro, psychiatric, endocrine,  integumentary and hematologic systems were reviewed and are otherwise  negative/unremarkable except for positive findings mentioned above in the HPI.   MEDICATIONS AT HOME:   Prior to Admission medications   Medication Sig Start Date End Date Taking? Authorizing Provider  Multiple Vitamins-Minerals (CENTRUM MEN PO) Take 1 tablet by mouth daily.    Yes [provider]  pantoprazole (PROTONIX) 40 MG tablet Take 40 mg by mouth daily. 04/22/19  Yes [provider]  ondansetron [facility])  4 MG tablet Take 4 mg by mouth every 8 (eight) hours as needed for nausea.  04/23/19 04/30/19  [provider]  oxyCODONE (OXY IR/ROXICODONE) 5 MG immediate release tablet Take 5 mg by mouth every 4 (four) hours as needed for pain.  04/10/19   [provider]  protein supplement shake (PREMIER PROTEIN) LIQD Take 325 mLs (11 oz total) by mouth 2 (two) times daily between meals. 11/05/17   Epifanio Lesches, MD      VITAL SIGNS:  Blood pressure 109/79, pulse 91, temperature 98.8 F (37.1 C), temperature source Axillary, resp. rate 15, SpO2 95 %.   PHYSICAL EXAMINATION:  Physical Exam  GENERAL:  53 y.o.-year-old Caucasian male patient lying in the bed with no acute distress.  EYES: Pupils equal, round, reactive to light and accommodation. No scleral icterus. Extraocular muscles intact.  HEENT: Head atraumatic, normocephalic. Oropharynx and nasopharynx clear.  NECK:  Supple, no jugular venous distention. No thyroid enlargement, no tenderness.  LUNGS: Normal breath sounds bilaterally, no wheezing, rales,rhonchi or crepitation. No use of accessory muscles of respiration.  CARDIOVASCULAR: Regular rate and rhythm, S1, S2 normal. No murmurs, rubs, or gallops.  ABDOMEN: Soft, nondistended, nontender. Bowel sounds present. No organomegaly or mass.  Urostomy bag in place EXTREMITIES: No pedal edema, cyanosis, or clubbing.  NEUROLOGIC: He has spina bifida and bilateral knee and ankle contractures and deformities.  PSYCHIATRIC: The patient is alert and oriented x 3.  Normal affect and good eye contact. SKIN: No obvious rash, lesion, or ulcer.   LABORATORY PANEL:   CBC Recent Labs  Lab 04/23/19 2007  WBC 14.4*  HGB 13.0  HCT 41.7  PLT 345   ------------------------------------------------------------------------------------------------------------------  Chemistries  Recent Labs  Lab 04/23/19 2007  NA 136  K 3.6  CL 105  CO2 19*  GLUCOSE 111*  BUN 24*  CREATININE 1.25*  CALCIUM 8.9   ------------------------------------------------------------------------------------------------------------------  Cardiac Enzymes No results for input(s): TROPONINI in the last 168 hours. ------------------------------------------------------------------------------------------------------------------  RADIOLOGY:  No results found.    IMPRESSION AND PLAN:   1.  Sepsis.  This is clearly secondary to UTI.  The patient will be admitted to a medical monitored bed.  He will be continued on IV Rocephin and will follow urine and blood cultures.   The patient will be hydrated with IV normal saline.  We will follow lactic acid level.  2. Mild dehydration.  Patient will be hydrated with IV normal saline and will follow his BMP.  3.   GERD.  PPI therapy will be resumed.  4.  DVT prophylaxis.  Subcutaneous Lovenox  All the records are reviewed and case discussed with ED provider. The plan of care was discussed in details with the patient (and family). I answered all questions. The patient agreed to proceed with the above mentioned plan. Further management will depend upon hospital course.   CODE STATUS: Full code  TOTAL TIME TAKING CARE OF THIS PATIENT: 55 minutes.    Christel Mormon M.D on 04/24/2019 at 1:59 AM  Pager - [phone]  After 6pm go to [email or web address] - Proofreader  [facility]  Office  [phone]  CC: Primary care physician; Idelle Crouch, MD   Note: This dictation was prepared with Dragon dictation along with smaller phrase technology. Any transcriptional errors that result from this process are unintentional.

## 2019-04-25 ENCOUNTER — Inpatient Hospital Stay: Payer: Medicare Other

## 2019-04-25 LAB — URINALYSIS, ROUTINE W REFLEX MICROSCOPIC
Bilirubin Urine: NEGATIVE
Glucose, UA: NEGATIVE mg/dL
Ketones, ur: 20 mg/dL — AB
Nitrite: NEGATIVE
Protein, ur: 30 mg/dL — AB
RBC / HPF: >50 RBC/hpf — ABNORMAL HIGH (ref 0–5)
Specific Gravity, Urine: 1.008 (ref 1.005–1.030)
WBC, UA: >50 WBC/hpf — ABNORMAL HIGH (ref 0–5)
pH: 7 (ref 5.0–8.0)

## 2019-04-25 LAB — BASIC METABOLIC PANEL
Anion gap: 7 (ref 5–15)
BUN: 17 mg/dL (ref 6–20)
CO2: 18 mmol/L — ABNORMAL LOW (ref 22–32)
Calcium: 8.1 mg/dL — ABNORMAL LOW (ref 8.9–10.3)
Chloride: 118 mmol/L — ABNORMAL HIGH (ref 98–111)
Creatinine, Ser: 1.17 mg/dL (ref 0.61–1.24)
GFR calc Af Amer: >60 mL/min (ref >60)
GFR calc non Af Amer: >60 mL/min (ref >60)
Glucose, Bld: 99 mg/dL (ref 70–99)
Potassium: 3.7 mmol/L (ref 3.5–5.1)
Sodium: 143 mmol/L (ref 135–145)

## 2019-04-25 LAB — MAGNESIUM: Magnesium: 2.1 mg/dL (ref 1.7–2.4)

## 2019-04-25 LAB — CBC
HCT: 35.7 % — ABNORMAL LOW (ref 39.0–52.0)
Hemoglobin: 10.8 g/dL — ABNORMAL LOW (ref 13.0–17.0)
MCH: 26.7 pg (ref 26.0–34.0)
MCHC: 30.3 g/dL (ref 30.0–36.0)
MCV: 88.1 fL (ref 80.0–100.0)
Platelets: 250 10*3/uL (ref 150–400)
RBC: 4.05 MIL/uL — ABNORMAL LOW (ref 4.22–5.81)
RDW: 14.6 % (ref 11.5–15.5)
WBC: 9.3 10*3/uL (ref 4.0–10.5)
nRBC: 0.2 % (ref 0.0–0.2)

## 2019-04-25 LAB — HIV ANTIBODY (ROUTINE TESTING W REFLEX): HIV Screen 4th Generation wRfx: NONREACTIVE

## 2019-04-25 MED ORDER — PROMETHAZINE HCL 25 MG/ML IJ SOLN
12.5000 mg | Freq: Four times a day (QID) | INTRAMUSCULAR | Status: DC | PRN
  Administered 2019-04-25 – 2019-04-26 (×3): 12.5 mg via INTRAVENOUS
  Filled 2019-04-25 (×3): qty 1

## 2019-04-25 NOTE — Plan of Care (Signed)
  Problem: Urinary Elimination: Goal: Signs and symptoms of infection will decrease Outcome: Progressing   Problem: Pain Managment: Goal: General experience of comfort will improve Outcome: Progressing   Problem: Safety: Goal: Ability to remain free from injury will improve Outcome: Progressing   Problem: Skin Integrity: Goal: Risk for impaired skin integrity will decrease Outcome: Progressing   Problem: Education: Goal: Knowledge of General Education information will improve Description: Including pain rating scale, medication(s)/side effects and non-pharmacologic comfort measures Outcome: Progressing

## 2019-04-25 NOTE — Progress Notes (Signed)
[place] [facility] [place] NAME: Gilberto Stanforth    MR#:  [id number]  DATE OF BIRTH:  1966/01/21  SUBJECTIVE:  CHIEF COMPLAINT:   Chief Complaint  Patient presents with  . Weakness   Patient complained of still having nausea and vomiting despite  Zofran.  Stated only Phenergan helps.  Phenergan was ordered this morning.     REVIEW OF SYSTEMS:  Review of Systems  Constitutional: Negative for chills and fever.  HENT: Negative for hearing loss and tinnitus.   Eyes: Negative for blurred vision.  Respiratory: Negative for cough and shortness of breath.   Cardiovascular: Negative for chest pain and palpitations.  Gastrointestinal: Positive for nausea and vomiting. Negative for heartburn.  Genitourinary: Negative for dysuria and urgency.  Musculoskeletal: Negative for joint pain.       Paraplegic due to history of spina bifid  Skin: Negative for itching and rash.  Neurological: Negative for dizziness and headaches.  Psychiatric/Behavioral: Negative for depression and hallucinations.    DRUG ALLERGIES:   Allergies  Allergen Reactions  . Ivp Dye [Iodinated Diagnostic Agents] Hives  . Morphine And Related Nausea And Vomiting  . Sulfa Antibiotics Other (See Comments)    Reaction: unknown  . Latex Rash   VITALS:  Blood pressure 128/68, pulse 75, temperature 98.7 F (37.1 C), temperature source Oral, resp. rate 16, weight 56 kg, SpO2 97 %. PHYSICAL EXAMINATION:  Physical Exam   GENERAL:  53 y.o.-year-old Caucasian male patient lying in the bed with no acute distress.  EYES: Pupils equal, round, reactive to light and accommodation. No scleral icterus. Extraocular muscles intact.  HEENT: Head atraumatic, normocephalic. Oropharynx and nasopharynx clear.  NECK:  Supple, no jugular venous distention. No thyroid enlargement, no tenderness.  LUNGS: Normal breath sounds bilaterally, no wheezing, rales,rhonchi or crepitation. No use of accessory  muscles of respiration.  CARDIOVASCULAR: Regular rate and rhythm, S1, S2 normal. No murmurs, rubs, or gallops.  ABDOMEN: Soft, nondistended, nontender. Bowel sounds present. No organomegaly or mass.  Urostomy bag in place EXTREMITIES: Patient paraplegic due to history of spina bifid.    NEUROLOGIC: He has spina bifida and bilateral knee and ankle contractures and deformities.  PSYCHIATRIC: The patient is alert and oriented x 3.  Normal affect and good eye contact. SKIN: No obvious rash, lesion, or ulcer.  LABORATORY PANEL:  Male CBC Recent Labs  Lab 04/25/19 0537  WBC 9.3  HGB 10.8*  HCT 35.7*  PLT 250   ------------------------------------------------------------------------------------------------------------------ Chemistries  Recent Labs  Lab 04/25/19 0537  NA 143  K 3.7  CL 118*  CO2 18*  GLUCOSE 99  BUN 17  CREATININE 1.17  CALCIUM 8.1*  MG 2.1   RADIOLOGY:  No results found. ASSESSMENT AND PLAN:   1.  Sepsis.  This is clearly secondary to UTI.   Patient on IV antibiotics with Rocephin.  IV fluid hydration.  Urine culture redness multiple specialist.  Likely contaminant.  Requested for repeat urinalysis and culture. Lactic acid level normal 1 out of 4 sets of blood cultures with gram-negative rods.  Likely contaminant.  Follow-up on final results.  Covid test negative With nausea and vomiting.  No abdominal pains.  Likely due to gastritis.  Requested for KUB.  2. Mild dehydration.  Patient will be hydrated with IV normal saline and will follow his BMP.  3.   GERD.  PPI therapy will be resumed.  4.  DVT prophylaxis.  Subcutaneous Lovenox  All the records  are reviewed and case discussed with Care Management/Social Worker. Management plans discussed with the patient, family and they are in agreement.  CODE STATUS: Full Code  TOTAL TIME TAKING CARE OF THIS PATIENT: 33 minutes.   More than 50% of the time was spent in counseling/coordination of care: YES   POSSIBLE D/C IN 2 DAYS, DEPENDING ON CLINICAL CONDITION.   Maidie Streight M.D on 04/25/2019 at 1:17 PM  Between 7am to 6pm - Pager - [phone]  After 6pm go to [email or web address] - Proofreader  [employer] [facility]  Office  [phone]  CC: Primary care physician; Idelle Crouch, MD  Note: This dictation was prepared with Dragon dictation along with smaller phrase technology. Any transcriptional errors that result from this process are unintentional.

## 2019-04-26 ENCOUNTER — Inpatient Hospital Stay: Payer: Medicare Other

## 2019-04-26 LAB — BASIC METABOLIC PANEL
Anion gap: 8 (ref 5–15)
BUN: 20 mg/dL (ref 6–20)
CO2: 19 mmol/L — ABNORMAL LOW (ref 22–32)
Calcium: 8.7 mg/dL — ABNORMAL LOW (ref 8.9–10.3)
Chloride: 121 mmol/L — ABNORMAL HIGH (ref 98–111)
Creatinine, Ser: 1.01 mg/dL (ref 0.61–1.24)
GFR calc Af Amer: >60 mL/min (ref >60)
GFR calc non Af Amer: >60 mL/min (ref >60)
Glucose, Bld: 94 mg/dL (ref 70–99)
Potassium: 3.4 mmol/L — ABNORMAL LOW (ref 3.5–5.1)
Sodium: 148 mmol/L — ABNORMAL HIGH (ref 135–145)

## 2019-04-26 LAB — HEMOGLOBIN AND HEMATOCRIT, BLOOD
HCT: 35.1 % — ABNORMAL LOW (ref 39.0–52.0)
HCT: 31.6 % — ABNORMAL LOW (ref 39.0–52.0)
Hemoglobin: 10.6 g/dL — ABNORMAL LOW (ref 13.0–17.0)
Hemoglobin: 9.7 g/dL — ABNORMAL LOW (ref 13.0–17.0)

## 2019-04-26 LAB — CULTURE, BLOOD (ROUTINE X 2): Special Requests: ADEQUATE

## 2019-04-26 LAB — MAGNESIUM: Magnesium: 2.3 mg/dL (ref 1.7–2.4)

## 2019-04-26 MED ORDER — PANTOPRAZOLE SODIUM 40 MG IV SOLR
40.0000 mg | Freq: Two times a day (BID) | INTRAVENOUS | Status: DC
  Administered 2019-04-26 – 2019-04-29 (×6): 40 mg via INTRAVENOUS
  Filled 2019-04-26 (×7): qty 40

## 2019-04-26 MED ORDER — PROCHLORPERAZINE EDISYLATE 10 MG/2ML IJ SOLN
10.0000 mg | Freq: Four times a day (QID) | INTRAMUSCULAR | Status: DC | PRN
  Administered 2019-04-26 (×2): 10 mg via INTRAVENOUS
  Filled 2019-04-26 (×3): qty 2

## 2019-04-26 MED ORDER — POTASSIUM CHLORIDE CRYS ER 20 MEQ PO TBCR
20.0000 meq | EXTENDED_RELEASE_TABLET | Freq: Once | ORAL | Status: DC
  Filled 2019-04-26: qty 1

## 2019-04-26 MED ORDER — POTASSIUM CHLORIDE 10 MEQ/100ML IV SOLN
10.0000 meq | INTRAVENOUS | Status: AC
  Administered 2019-04-26 (×2): 10 meq via INTRAVENOUS
  Filled 2019-04-26: qty 100

## 2019-04-26 NOTE — Progress Notes (Signed)
[name] NAME: [name]    MR#:  [id number]  DATE OF BIRTH:  09-30-1966  SUBJECTIVE:  CHIEF COMPLAINT:   Chief Complaint  Patient presents with  . Weakness   Patient complained of intermittent nausea and vomiting.  Reports this is chronic.  Has kidney stones on imaging.  REVIEW OF SYSTEMS:  Review of Systems  Constitutional: Negative for chills and fever.  HENT: Negative for hearing loss and tinnitus.   Eyes: Negative for blurred vision.  Respiratory: Negative for cough and shortness of breath.   Cardiovascular: Negative for chest pain and palpitations.  Gastrointestinal: Positive for nausea and vomiting. Negative for heartburn.  Genitourinary: Negative for dysuria and urgency.  Musculoskeletal: Negative for joint pain.       Paraplegic due to history of spina bifid  Skin: Negative for itching and rash.  Neurological: Negative for dizziness and headaches.  Psychiatric/Behavioral: Negative for depression and hallucinations.    DRUG ALLERGIES:   Allergies  Allergen Reactions  . Ivp Dye [Iodinated Diagnostic Agents] Hives  . Morphine And Related Nausea And Vomiting  . Sulfa Antibiotics Other (See Comments)    Reaction: unknown  . Latex Rash   VITALS:  Blood pressure (!) 156/75, pulse 85, temperature 98.4 F (36.9 C), temperature source Oral, resp. rate 19, weight 56 kg, SpO2 98 %. PHYSICAL EXAMINATION:  Physical Exam   GENERAL:  53 y.o.-year-old Caucasian male patient lying in the bed with no acute distress.  EYES: Pupils equal, round, reactive to light and accommodation. No scleral icterus. Extraocular muscles intact.  HEENT: Head atraumatic, normocephalic. Oropharynx and nasopharynx clear.  NECK:  Supple, no jugular venous distention. No thyroid enlargement, no tenderness.  LUNGS: Normal breath sounds bilaterally, no wheezing, rales,rhonchi or crepitation. No use of accessory muscles of respiration.   CARDIOVASCULAR: Regular rate and rhythm, S1, S2 normal. No murmurs, rubs, or gallops.  ABDOMEN: Soft, nondistended, nontender. Bowel sounds present. No organomegaly or mass.  Urostomy bag in place EXTREMITIES: Patient paraplegic due to history of spina bifid.    NEUROLOGIC: He has spina bifida and bilateral knee and ankle contractures and deformities.  PSYCHIATRIC: The patient is alert and oriented x 3.  Normal affect and good eye contact. SKIN: No obvious rash, lesion, or ulcer.  LABORATORY PANEL:  Male CBC Recent Labs  Lab 04/25/19 0537  WBC 9.3  HGB 10.8*  HCT 35.7*  PLT 250   ------------------------------------------------------------------------------------------------------------------ Chemistries  Recent Labs  Lab 04/26/19 0257  NA 148*  K 3.4*  CL 121*  CO2 19*  GLUCOSE 94  BUN 20  CREATININE 1.01  CALCIUM 8.7*  MG 2.3   RADIOLOGY:  No results found. ASSESSMENT AND PLAN:   1.  Sepsis.  This is clearly secondary to UTI.   Patient on IV antibiotics with Rocephin.  IV fluid hydration.  Urine culture redness multiple specialist.  Likely contaminant.  Repeat urinalysis still with evidence of UTI.  1 out of 4 sets of blood cultures with Providencia stuartii sensitive to Rocephin.  Suspicion of being a contaminant.  Patient already on IV Rocephin.  Requested for repeat blood cultures today.  Covid test negative With nausea and vomiting.  No abdominal pains.  Likely due to gastritis.  KUB with evidence of numerous kidney stones which is not changed.  No other acute abnormality.  2. Mild dehydration.  Patient will be hydrated with IV normal saline and will follow his BMP.  3.  GERD.  PPI therapy will be resumed.  4.  DVT prophylaxis.    SCDs. holding Lovenox today due to evidence of dark stools.  Requested for stat hemoglobin and Hemoccult test.  All the records are reviewed and case discussed with Care Management/Social Worker. Management plans discussed with the  patient, family and they are in agreement. Called sister; [name] and updated her on treatment plans as outlined above.  CODE STATUS: Full Code  TOTAL TIME TAKING CARE OF THIS PATIENT: 35 minutes.   More than 50% of the time was spent in counseling/coordination of care: YES  POSSIBLE D/C IN 2 DAYS, DEPENDING ON CLINICAL CONDITION.   Justin Meisenheimer M.D on 04/26/2019 at 3:24 PM  Between 7am to 6pm - Pager - [phone]  After 6pm go to [email or web address] - Proofreader  [facility]  Office  [phone]  CC: Primary care physician; Idelle Crouch, MD  Note: This dictation was prepared with Dragon dictation along with smaller phrase technology. Any transcriptional errors that result from this process are unintentional.

## 2019-04-27 ENCOUNTER — Encounter: Payer: Self-pay | Admitting: *Deleted

## 2019-04-27 ENCOUNTER — Inpatient Hospital Stay: Payer: Medicare Other | Admitting: Anesthesiology

## 2019-04-27 ENCOUNTER — Ambulatory Visit: Admission: RE | Admit: 2019-04-27 | Payer: Medicare Other | Source: Home / Self Care | Admitting: Gastroenterology

## 2019-04-27 ENCOUNTER — Encounter: Admission: RE | Payer: Self-pay | Source: Home / Self Care

## 2019-04-27 ENCOUNTER — Encounter
Admission: EM | Disposition: A | Discharge status: Home / Self Care | Payer: Self-pay | Source: Home / Self Care | Attending: Internal Medicine

## 2019-04-27 DIAGNOSIS — K921 Melena: Secondary | ICD-10-CM

## 2019-04-27 DIAGNOSIS — K922 Gastrointestinal hemorrhage, unspecified: Secondary | ICD-10-CM

## 2019-04-27 HISTORY — PX: ESOPHAGOGASTRODUODENOSCOPY (EGD) WITH PROPOFOL: SHX5813

## 2019-04-27 LAB — BASIC METABOLIC PANEL
Anion gap: 10 (ref 5–15)
BUN: 27 mg/dL — ABNORMAL HIGH (ref 6–20)
CO2: 13 mmol/L — ABNORMAL LOW (ref 22–32)
Calcium: 8.6 mg/dL — ABNORMAL LOW (ref 8.9–10.3)
Chloride: 128 mmol/L — ABNORMAL HIGH (ref 98–111)
Creatinine, Ser: 0.98 mg/dL (ref 0.61–1.24)
GFR calc Af Amer: >60 mL/min (ref >60)
GFR calc non Af Amer: >60 mL/min (ref >60)
Glucose, Bld: 76 mg/dL (ref 70–99)
Potassium: 4.1 mmol/L (ref 3.5–5.1)
Sodium: 151 mmol/L — ABNORMAL HIGH (ref 135–145)

## 2019-04-27 LAB — CBC
HCT: 32 % — ABNORMAL LOW (ref 39.0–52.0)
Hemoglobin: 9.8 g/dL — ABNORMAL LOW (ref 13.0–17.0)
MCH: 27.1 pg (ref 26.0–34.0)
MCHC: 30.6 g/dL (ref 30.0–36.0)
MCV: 88.4 fL (ref 80.0–100.0)
Platelets: 277 10*3/uL (ref 150–400)
RBC: 3.62 MIL/uL — ABNORMAL LOW (ref 4.22–5.81)
RDW: 15.2 % (ref 11.5–15.5)
WBC: 10.5 10*3/uL (ref 4.0–10.5)
nRBC: 0 % (ref 0.0–0.2)

## 2019-04-27 LAB — OCCULT BLOOD X 1 CARD TO LAB, STOOL: Fecal Occult Bld: POSITIVE — AB

## 2019-04-27 LAB — MAGNESIUM: Magnesium: 2 mg/dL (ref 1.7–2.4)

## 2019-04-27 SURGERY — ESOPHAGOGASTRODUODENOSCOPY (EGD) WITH PROPOFOL
Anesthesia: General

## 2019-04-27 MED ORDER — SODIUM CHLORIDE 0.9 % IV SOLN: INTRAVENOUS | Status: DC

## 2019-04-27 MED ORDER — LIDOCAINE HCL (CARDIAC) PF 100 MG/5ML IV SOSY
PREFILLED_SYRINGE | INTRAVENOUS | Status: DC | PRN
  Administered 2019-04-27: 50 mg via INTRAVENOUS

## 2019-04-27 MED ORDER — PROPOFOL 500 MG/50ML IV EMUL
INTRAVENOUS | Status: DC | PRN
  Administered 2019-04-27: 150 ug/kg/min via INTRAVENOUS

## 2019-04-27 MED ORDER — PROPOFOL 10 MG/ML IV BOLUS
INTRAVENOUS | Status: DC | PRN
  Administered 2019-04-27: 50 mg via INTRAVENOUS

## 2019-04-27 MED ORDER — SODIUM CHLORIDE 0.45 % IV SOLN
INTRAVENOUS | Status: DC
  Administered 2019-04-27 – 2019-04-28 (×2): via INTRAVENOUS

## 2019-04-27 MED ORDER — SODIUM CHLORIDE 0.9 % IV SOLN
INTRAVENOUS | Status: DC | PRN
  Administered 2019-04-27: 13:00:00 via INTRAVENOUS

## 2019-04-27 NOTE — Op Note (Signed)
[facility] Gastroenterology Patient Name: [name] Procedure Date: 04/27/2019 12:59 PM MRN: [id number] Account #: [id number] Date of Birth: [birth year] Admit Type: Inpatient Age: 53 Room: [facility] ENDO ROOM 4 Gender: Male Note Status: Finalized Procedure:            Upper GI endoscopy Indications:          Melena Providers:            Jonathon Bellows MD, MD Referring MD:         Leonie Douglas. Doy Hutching, MD (Referring MD) Medicines:            Monitored Anesthesia Care Complications:        No immediate complications. Procedure:            Pre-Anesthesia Assessment:                       - Prior to the procedure, a History and Physical was                        performed, and patient medications, allergies and                        sensitivities were reviewed. The patient's tolerance of                        previous anesthesia was reviewed.                       - The risks and benefits of the procedure and the                        sedation options and risks were discussed with the                        patient. All questions were answered and informed                        consent was obtained.                       - ASA Grade Assessment: III - A patient with severe                        systemic disease.                       After obtaining informed consent, the endoscope was                        passed under direct vision. Throughout the procedure,                        the patient's blood pressure, pulse, and oxygen                        saturations were monitored continuously. The Endoscope                        was introduced through the mouth, and advanced to the  third part of duodenum. The upper GI endoscopy was                        accomplished with ease. The patient tolerated the                        procedure well. Findings:      The stomach was normal.      The cardia and gastric fundus were normal on  retroflexion.      LA Grade C (one or more mucosal breaks continuous between tops of 2 or       more mucosal folds, less than 75% circumference) esophagitis with no       bleeding was found in the lower third of the esophagus.      Few non-bleeding superficial duodenal ulcers with a clean ulcer base       (Forrest Class III) were found in the duodenal bulb and in the first       portion of the duodenum. The largest lesion was 15 mm in largest       dimension. Impression:           - Normal stomach.                       - LA Grade C esophagitis.                       - Non-bleeding duodenal ulcers with a clean ulcer base                        (Forrest Class III).                       - No specimens collected. Recommendation:       - Return patient to hospital ward for ongoing care.                       - Clear liquid diet today.                       - 1. Monitor CBC and transfuse as needed                       2. Check H pylori stool antigen , avoid NSAID's                       3. Prilosec 40 mg BID for 12 weeks                       4. Repeat EGD in 8-10 weeks to check for healing of the                        ulcer - if still persists may need to consider                        evaluation for Zollinger ellison sybndrome due to h/o                        duodenal ulcers on prior EGD in similar locations. Procedure Code(s):    --- Professional ---  70786, Esophagogastroduodenoscopy, flexible, transoral;                        diagnostic, including collection of specimen(s) by                        brushing or washing, when performed (separate procedure) Diagnosis Code(s):    --- Professional ---                       K20.9, Esophagitis, unspecified                       K26.9, Duodenal ulcer, unspecified as acute or chronic,                        without hemorrhage or perforation                       K92.1, Melena (includes Hematochezia) CPT copyright 2019  American Medical Association. All rights reserved. The codes documented in this report are preliminary and upon coder review may  be revised to meet current compliance requirements. Jonathon Bellows, MD Jonathon Bellows MD, MD 04/27/2019 1:14:03 PM This report has been signed electronically. Number of Addenda: 0 Note Initiated On: 04/27/2019 12:59 PM Estimated Blood Loss: Estimated blood loss: none.      [facility]

## 2019-04-27 NOTE — Consult Note (Signed)
[name] , MD [address], [place], [place], [place], [address] [address], [place], [place], [place], [address] Phone: [phone]  Fax: [phone]  Consultation  Referring Provider:   Dr Stark Jock Primary Care Physician:  Idelle Crouch, MD Primary Gastroenterologist: [facility]          Reason for Consultation:     Melena   Date of Admission:  04/23/2019 Date of Consultation:  04/27/2019         HPI:   [name] is a 53 y.o. male who has a history of a complete abdominal perineal proctocolectomy with colostomy placed in February 2019 for chronic bowel dysmotility for spina bifida.  History of LA grade D esophagitis gastroduodenal ulceration as per EGD in 2015.  He was admitted on 04/24/2019 with sepsis secondary to a UTI with mild dehydration.  I have been consulted to see him today for black tarry stool in his colostomy bag.  He states that is been ongoing for about 2 to 3 days.  Denies any NSAID use.  Denies any significant abdominal discomfort.  Denies any hematemesis.    Past Medical History:  Diagnosis Date  . Decubitus ulcer   . Renal disorder   . Spina bifida [facility])     Past Surgical History:  Procedure Laterality Date  . ABDOMINAL SURGERY    . BACK SURGERY    . COLON SURGERY    . ESOPHAGOGASTRODUODENOSCOPY (EGD) WITH PROPOFOL N/A 06/03/2016   Procedure: ESOPHAGOGASTRODUODENOSCOPY (EGD) WITH PROPOFOL;  Surgeon: [name], MD;  Location: [facility] ENDOSCOPY;  Service: Endoscopy;  Laterality: N/A;  . ilieostomy    . VENTRICULOPERITONEAL SHUNT    . vp shunt removal      Prior to Admission medications   Medication Sig Start Date End Date Taking? Authorizing Provider  Multiple Vitamins-Minerals (CENTRUM MEN PO) Take 1 tablet by mouth daily.    Yes [provider]  pantoprazole (PROTONIX) 40 MG tablet Take 40 mg by mouth daily. 04/22/19  Yes [provider]  ondansetron (ZOFRAN) 4 MG tablet Take 4 mg by mouth every 8 (eight) hours as  needed for nausea.  04/23/19 04/30/19  [provider]  oxyCODONE (OXY IR/ROXICODONE) 5 MG immediate release tablet Take 5 mg by mouth every 4 (four) hours as needed for pain.  04/10/19   [provider]  protein supplement shake (PREMIER PROTEIN) LIQD Take 325 mLs (11 oz total) by mouth 2 (two) times daily between meals. 11/05/17   Epifanio Lesches, MD    Family History  Problem Relation Age of Onset  . Diabetes Mellitus II Father      Social History   Tobacco Use  . Smoking status: Never Smoker  . Smokeless tobacco: Never Used  Substance Use Topics  . Alcohol use: No  . Drug use: No    Allergies as of 04/23/2019 - Review Complete 04/23/2019  Allergen Reaction Noted  . Ivp dye [iodinated diagnostic agents] Hives 04/14/2015  . Morphine and related Nausea And Vomiting 04/14/2015  . Sulfa antibiotics Other (See Comments) 04/14/2015  . Latex Rash 04/14/2015    Review of Systems:    All systems reviewed and negative except where noted in HPI.   Physical Exam:  Vital signs in last 24 hours: Temp:  [98.2 F (36.8 C)-98.8 F (37.1 C)] 98.2 F (36.8 C) (07/24 0434) Pulse Rate:  [83-98] 98 (07/24 0434) Resp:  [18] 18 (07/24 0434) BP: (144-158)/(87) 144/87 (07/24 0434) SpO2:  [99 %-100 %] 99 % (  07/24 0434) Last BM Date: 04/26/19 General:   Pleasant, cooperative in NAD Head:  Normocephalic and atraumatic. Eyes:   No icterus.   Conjunctiva pink. PERRLA. Ears:  Normal auditory acuity. Neck:  Supple; no masses or thyroidomegaly Lungs: Respirations even and unlabored. Lungs clear to auscultation bilaterally.   No wheezes, crackles, or rhonchi.  Heart:  Regular rate and rhythm;  Without murmur, clicks, rubs or gallops Abdomen:  Soft, nondistended, nontender. Normal bowel sounds. No appreciable masses or hepatomegaly.  No rebound or guarding.  Left upper quadrant colostomy bag seen with black tarry stool liquid Neurologic:  Alert and oriented x3;   Skin:  Intact  without significant lesions or rashes. Cervical Nodes:  No significant cervical adenopathy. Psych:  Alert and cooperative. Normal affect.  LAB RESULTS: Recent Labs    04/25/19 0537 04/26/19 1600 04/26/19 2308 04/27/19 0526  WBC 9.3  --   --  10.5  HGB 10.8* 10.6* 9.7* 9.8*  HCT 35.7* 35.1* 31.6* 32.0*  PLT 250  --   --  277   BMET Recent Labs    04/25/19 0537 04/26/19 0257 04/27/19 0526  NA 143 148* 151*  K 3.7 3.4* 4.1  CL 118* 121* 128*  CO2 18* 19* 13*  GLUCOSE 99 94 76  BUN 17 20 27*  CREATININE 1.17 1.01 0.98  CALCIUM 8.1* 8.7* 8.6*   LFT No results for input(s): PROT, ALBUMIN, AST, ALT, ALKPHOS, BILITOT, BILIDIR, IBILI in the last 72 hours. PT/INR No results for input(s): LABPROT, INR in the last 72 hours.  STUDIES: Ct Abdomen Pelvis Wo Contrast  Result Date: 04/26/2019 CLINICAL DATA:  Abdominal pain and generalized weakness for 3 days EXAM: CT ABDOMEN AND PELVIS WITHOUT CONTRAST TECHNIQUE: Multidetector CT imaging of the abdomen and pelvis was performed following the standard protocol without IV contrast. COMPARISON:  11/02/2017 FINDINGS: Lower chest: No acute abnormality. Hepatobiliary: No focal liver abnormality is seen. No gallstones, gallbladder wall thickening, or biliary dilatation. Pancreas: Unremarkable. No pancreatic ductal dilatation or surrounding inflammatory changes. Spleen: Normal in size without focal abnormality. Adrenals/Urinary Tract: Adrenal glands are well visualized bilaterally. Bilateral staghorn calculi are identified significantly increased particularly on the left when compared with the prior exam. There is chronic stable hydronephrosis of the left kidney identified without definitive obstructive lesion. Ileostomy is noted in the right lower quadrant. Some densities are noted within which were not seen on the prior exam and may represent some recently passed stones. Bladder has been surgically removed. Stomach/Bowel: Colostomy is noted in the  left mid abdomen. No obstructive or inflammatory changes of the bowel are seen. The appendix is not well visualized. Stomach is well distended with ingested food stuffs. No small bowel abnormality is seen. Vascular/Lymphatic: No significant vascular findings are present. No enlarged abdominal or pelvic lymph nodes. Reproductive: Prostate is unremarkable. Other: No free pelvic fluid is noted. Musculoskeletal: Chronic degenerative changes are noted about the hip joints. There are changes consistent with the known history of spina bifida. No acute bony abnormality is seen. IMPRESSION: Changes of prior ileostomy and colostomy. Chronic stable hydronephrosis in the left kidney. Bilateral staghorn calculi are noted which have increased significantly on the left when compared with the prior exam. Some densities are noted within the ileal conduit which may be related to passed stones. Chronic changes as described above.  No acute abnormality is noted. Electronically Signed   By: Inez Catalina M.D.   On: 04/26/2019 16:57   Dg Abd 1 View  Result Date: 04/25/2019  CLINICAL DATA:  Nausea and vomiting. EXAM: ABDOMEN - 1 VIEW COMPARISON:  March 30 2019.  CT scan November 02, 2018. FINDINGS: Bilateral staghorn calculi are identified in both kidneys. The stone burden in both kidneys is similar in the interval. Chronic changes in the pelvis and hips. No bowel obstruction is noted. No other acute abnormalities are identified. IMPRESSION: 1. Numerous stones, probably staghorn, in both kidneys are unchanged. 2. No other acute abnormalities. Electronically Signed   By: Dorise Bullion III M.D   On: 04/25/2019 14:08      Impression / Plan:   [name] is a 53 y.o. y/o male with with a history of chronic bowel dysmotility secondary to neurogenic bladder admitted with sepsis secondary to UTI.  He does have a colostomy secondary to to the total colectomy performed for the chronic bowel dysmotility.  I have been consulted to  evaluate him for melena for the past 3 days.  Baseline hemoglobin is close to 12 g.  Today is 9.8 g.  Elevated BUN/creatinine ratio.  This is suggestive of an upper GI bleed.  Plan 1.  N.p.o. 2.  EGD today 3.  IV PPI  I have discussed alternative options, risks & benefits,  which include, but are not limited to, bleeding, infection, perforation,respiratory complication & drug reaction.  The patient agrees with this plan & written consent will be obtained.     Thank you for involving me in the care of this patient.      LOS: 3 days   Chase Bellows, MD  04/27/2019, 12:10 PM

## 2019-04-27 NOTE — Anesthesia Postprocedure Evaluation (Signed)
Anesthesia Post Note  Patient: [name]  Procedure(s) Performed: ESOPHAGOGASTRODUODENOSCOPY (EGD) WITH PROPOFOL (N/A )  Patient location during evaluation: Endoscopy Anesthesia Type: General Level of consciousness: awake and alert Pain management: pain level controlled Vital Signs Assessment: post-procedure vital signs reviewed and stable Respiratory status: spontaneous breathing, nonlabored ventilation and respiratory function stable Cardiovascular status: blood pressure returned to baseline and stable Postop Assessment: no apparent nausea or vomiting Anesthetic complications: no     Last Vitals:  Vitals:   04/27/19 1334 04/27/19 1344  BP: (!) 127/94 (!) 145/74  Pulse: 94 92  Resp: 19 17  Temp:    SpO2: 100% 100%    Last Pain:  Vitals:   04/27/19 1344  TempSrc:   PainSc: 0-No pain                 Alphonsus Sias

## 2019-04-27 NOTE — Anesthesia Post-op Follow-up Note (Signed)
Anesthesia QCDR form completed.        

## 2019-04-27 NOTE — Progress Notes (Signed)
[facility] [place] [facility] [place] NAME: [name]    MR#:  [id number]  DATE OF BIRTH:  July 08, 1966  SUBJECTIVE:  CHIEF COMPLAINT:   Chief Complaint  Patient presents with   Weakness   Patient complained of intermittent nausea and vomiting.  No fevers.  Reports this is chronic.  Has kidney stones on imaging.  REVIEW OF SYSTEMS:  Review of Systems  Constitutional: Negative for chills and fever.  HENT: Negative for hearing loss and tinnitus.   Eyes: Negative for blurred vision.  Respiratory: Negative for cough and shortness of breath.   Cardiovascular: Negative for chest pain and palpitations.  Gastrointestinal: Positive for nausea. Negative for heartburn and vomiting.  Genitourinary: Negative for dysuria and urgency.  Musculoskeletal: Negative for joint pain.       Paraplegic due to history of spina bifid  Skin: Negative for itching and rash.  Neurological: Negative for dizziness and headaches.  Psychiatric/Behavioral: Negative for depression and hallucinations.    DRUG ALLERGIES:   Allergies  Allergen Reactions   Ivp Dye [Iodinated Diagnostic Agents] Hives   Morphine And Related Nausea And Vomiting   Sulfa Antibiotics Other (See Comments)    Reaction: unknown   Latex Rash   VITALS:  Blood pressure 138/86, pulse 88, temperature 98.2 F (36.8 C), temperature source Oral, resp. rate 20, weight 56 kg, SpO2 100 %. PHYSICAL EXAMINATION:  Physical Exam   GENERAL:  53 y.o.-year-old Caucasian male patient lying in the bed with no acute distress.  EYES: Pupils equal, round, reactive to light and accommodation. No scleral icterus. Extraocular muscles intact.  HEENT: Head atraumatic, normocephalic. Oropharynx and nasopharynx clear.  NECK:  Supple, no jugular venous distention. No thyroid enlargement, no tenderness.  LUNGS: Normal breath sounds bilaterally, no wheezing, rales,rhonchi or crepitation. No use of accessory muscles of  respiration.  CARDIOVASCULAR: Regular rate and rhythm, S1, S2 normal. No murmurs, rubs, or gallops.  ABDOMEN: Soft, nondistended, nontender. Bowel sounds present. No organomegaly or mass.  Urostomy bag in place EXTREMITIES: Patient paraplegic due to history of spina bifid.    NEUROLOGIC: He has spina bifida and bilateral knee and ankle contractures and deformities.  PSYCHIATRIC: The patient is alert and oriented x 3.  Normal affect and good eye contact. SKIN: No obvious rash, lesion, or ulcer.  LABORATORY PANEL:  Male CBC Recent Labs  Lab 04/27/19 0526  WBC 10.5  HGB 9.8*  HCT 32.0*  PLT 277   ------------------------------------------------------------------------------------------------------------------ Chemistries  Recent Labs  Lab 04/27/19 0526  NA 151*  K 4.1  CL 128*  CO2 13*  GLUCOSE 76  BUN 27*  CREATININE 0.98  CALCIUM 8.6*  MG 2.0   RADIOLOGY:  Ct Abdomen Pelvis Wo Contrast  Result Date: 04/26/2019 CLINICAL DATA:  Abdominal pain and generalized weakness for 3 days EXAM: CT ABDOMEN AND PELVIS WITHOUT CONTRAST TECHNIQUE: Multidetector CT imaging of the abdomen and pelvis was performed following the standard protocol without IV contrast. COMPARISON:  11/02/2017 FINDINGS: Lower chest: No acute abnormality. Hepatobiliary: No focal liver abnormality is seen. No gallstones, gallbladder wall thickening, or biliary dilatation. Pancreas: Unremarkable. No pancreatic ductal dilatation or surrounding inflammatory changes. Spleen: Normal in size without focal abnormality. Adrenals/Urinary Tract: Adrenal glands are well visualized bilaterally. Bilateral staghorn calculi are identified significantly increased particularly on the left when compared with the prior exam. There is chronic stable hydronephrosis of the left kidney identified without definitive obstructive lesion. Ileostomy is noted in the right lower quadrant. Some densities  are noted within which were not seen on the prior  exam and may represent some recently passed stones. Bladder has been surgically removed. Stomach/Bowel: Colostomy is noted in the left mid abdomen. No obstructive or inflammatory changes of the bowel are seen. The appendix is not well visualized. Stomach is well distended with ingested food stuffs. No small bowel abnormality is seen. Vascular/Lymphatic: No significant vascular findings are present. No enlarged abdominal or pelvic lymph nodes. Reproductive: Prostate is unremarkable. Other: No free pelvic fluid is noted. Musculoskeletal: Chronic degenerative changes are noted about the hip joints. There are changes consistent with the known history of spina bifida. No acute bony abnormality is seen. IMPRESSION: Changes of prior ileostomy and colostomy. Chronic stable hydronephrosis in the left kidney. Bilateral staghorn calculi are noted which have increased significantly on the left when compared with the prior exam. Some densities are noted within the ileal conduit which may be related to passed stones. Chronic changes as described above.  No acute abnormality is noted. Electronically Signed   By: Inez Catalina M.D.   On: 04/26/2019 16:57   ASSESSMENT AND PLAN:   1.  Sepsis.  This is clearly secondary to UTI.   Patient on IV antibiotics with Rocephin.  IV fluid hydration.  Urine culture redness multiple specialist.  Likely contaminant.  Repeat urinalysis still with evidence of UTI.  1 out of 4 sets of blood cultures with Providencia stuartii sensitive to Rocephin.  Suspicion of being a contaminant.  Patient already on IV Rocephin.  Requested for repeat blood cultures and so far no growth on repeat cultures..  Covid test negative With nausea and vomiting.  No abdominal pains.  Likely due to gastritis.  KUB with evidence of numerous kidney stones which is not changed.  No other acute abnormality.  Patient subsequently had CT abdomen and pelvis done without contrast with chronic stable left-sided hydronephrosis.   Changes of prior ileostomy.  Bilateral kidney stones.  No other acute findings.  2. Mild dehydration now with mild hyponatremia with sodium of 151 Was initially hydrated with normal saline.  Changed IV fluid from normal saline to half-normal saline.  Follow-up on repeat sodium level in a.m.  3.   GERD.  PPI therapy will be resumed.  4.    GI bleed. Patient noted to have dark stools in colostomy bag. Patient seen by gastroenterologist Dr. Vicente Males and had EGD done today which revealed LA Grade C esophagitis. Non-bleeding duodenal ulcers with a clean ulcer base (Forrest Class III). No specimens collected Placed on PPI  DVT prophylaxis.    SCDs.   All the records are reviewed and case discussed with Care Management/Social Worker. Management plans discussed with the patient, family and they are in agreement. Called sister; [name] and updated her on treatment plans as outlined above.  CODE STATUS: Full Code  TOTAL TIME TAKING CARE OF THIS PATIENT: 38 minutes.   More than 50% of the time was spent in counseling/coordination of care: YES  POSSIBLE D/C IN 2 DAYS, DEPENDING ON CLINICAL CONDITION.   Britlee Skolnik M.D on 04/27/2019 at 3:23 PM  Between 7am to 6pm - Pager - [phone]  After 6pm go to [email or web address] - Proofreader  [facility]  Office  [phone]  CC: Primary care physician; Idelle Crouch, MD  Note: This dictation was prepared with Dragon dictation along with smaller phrase technology. Any transcriptional errors that result from this process are unintentional.

## 2019-04-27 NOTE — Anesthesia Preprocedure Evaluation (Signed)
Anesthesia Evaluation  Patient identified by MRN, date of birth, ID band Patient awake    Reviewed: Allergy & Precautions, H&P , NPO status , Patient's Chart, lab work & pertinent test results  History of Anesthesia Complications Negative for: history of anesthetic complications  Airway Mallampati: III  TM Distance: <3 FB Neck ROM: limited    Dental  (+) Chipped, Poor Dentition, Missing   Pulmonary neg pulmonary ROS, neg shortness of breath,           Cardiovascular Exercise Tolerance: Good (-) angina(-) Past MI and (-) DOE negative cardio ROS       Neuro/Psych negative neurological ROS  negative psych ROS   GI/Hepatic Neg liver ROS, PUD, GERD  Medicated and Controlled,  Endo/Other  negative endocrine ROS  Renal/GU CRFRenal disease  negative genitourinary   Musculoskeletal   Abdominal   Peds  Hematology negative hematology ROS (+)   Anesthesia Other Findings Past Medical History: No date: Decubitus ulcer No date: Renal disorder No date: Spina bifida [facility])  Past Surgical History: No date: ABDOMINAL SURGERY No date: BACK SURGERY No date: COLON SURGERY 06/03/2016: ESOPHAGOGASTRODUODENOSCOPY (EGD) WITH PROPOFOL; N/A     Comment:  Procedure: ESOPHAGOGASTRODUODENOSCOPY (EGD) WITH               PROPOFOL;  Surgeon: [name], MD;  Location: [facility]               ENDOSCOPY;  Service: Endoscopy;  Laterality: N/A; No date: ilieostomy No date: VENTRICULOPERITONEAL SHUNT No date: vp shunt removal  BMI    Body Mass Index: 21.87 kg/m      Reproductive/Obstetrics negative OB ROS                             Anesthesia Physical Anesthesia Plan  ASA: III  Anesthesia Plan: General   Post-op Pain Management:    Induction: Intravenous  PONV Risk Score and Plan: Propofol infusion and TIVA  Airway Management Planned: Natural Airway and Nasal Cannula  Additional Equipment:   Intra-op  Plan:   Post-operative Plan:   Informed Consent: I have reviewed the patients History and Physical, chart, labs and discussed the procedure including the risks, benefits and alternatives for the proposed anesthesia with the patient or authorized representative who has indicated his/her understanding and acceptance.     Dental Advisory Given  Plan Discussed with: Anesthesiologist, CRNA and Surgeon  Anesthesia Plan Comments: (Patient consented for risks of anesthesia including but not limited to:  - adverse reactions to medications - risk of intubation if required - damage to teeth, lips or other oral mucosa - sore throat or hoarseness - Damage to heart, brain, lungs or loss of life  Patient voiced understanding.)        Anesthesia Quick Evaluation

## 2019-04-27 NOTE — Transfer of Care (Signed)
Immediate Anesthesia Transfer of Care Note  Patient: [name]  Procedure(s) Performed: ESOPHAGOGASTRODUODENOSCOPY (EGD) WITH PROPOFOL (N/A )  Patient Location: PACU  Anesthesia Type:General  Level of Consciousness: awake, alert  and oriented  Airway & Oxygen Therapy: Patient Spontanous Breathing and Patient connected to face mask oxygen  Post-op Assessment: Report given to RN and Post -op Vital signs reviewed and stable  Post vital signs: Reviewed and stable  Last Vitals:  Vitals Value Taken Time  BP    Temp    Pulse    Resp    SpO2      Last Pain:  Vitals:   04/27/19 1241  TempSrc: Tympanic  PainSc:          Complications: No apparent anesthesia complications

## 2019-04-27 NOTE — Care Management Important Message (Signed)
Important Message  Patient Details  Name: [name] MRN: [id number] Date of Birth: 03/01/1966   Medicare Important Message Given:  Yes     Juliann Pulse A Heberto Sturdevant 04/27/2019, 11:27 AM

## 2019-04-27 NOTE — H&P (Signed)
Jonathon Bellows, MD [address], [place], [place], [place], [address] [address] [place], [place], [place], [address] Phone: [phone]  Fax: [phone]  Primary Care Physician:  Idelle Crouch, MD   Pre-Procedure History & Physical: HPI:  [name] is a 53 y.o. male is here for an endoscopy    Past Medical History:  Diagnosis Date  . Decubitus ulcer   . Renal disorder   . Spina bifida [facility])     Past Surgical History:  Procedure Laterality Date  . ABDOMINAL SURGERY    . BACK SURGERY    . COLON SURGERY    . ESOPHAGOGASTRODUODENOSCOPY (EGD) WITH PROPOFOL N/A 06/03/2016   Procedure: ESOPHAGOGASTRODUODENOSCOPY (EGD) WITH PROPOFOL;  Surgeon: [name], MD;  Location: [facility] ENDOSCOPY;  Service: Endoscopy;  Laterality: N/A;  . ilieostomy    . VENTRICULOPERITONEAL SHUNT    . vp shunt removal      Prior to Admission medications   Medication Sig Start Date End Date Taking? Authorizing Provider  Multiple Vitamins-Minerals (CENTRUM MEN PO) Take 1 tablet by mouth daily.    Yes [provider]  pantoprazole (PROTONIX) 40 MG tablet Take 40 mg by mouth daily. 04/22/19  Yes [provider]  ondansetron (ZOFRAN) 4 MG tablet Take 4 mg by mouth every 8 (eight) hours as needed for nausea.  04/23/19 04/30/19  [provider]  oxyCODONE (OXY IR/ROXICODONE) 5 MG immediate release tablet Take 5 mg by mouth every 4 (four) hours as needed for pain.  04/10/19   [provider]  protein supplement shake (PREMIER PROTEIN) LIQD Take 325 mLs (11 oz total) by mouth 2 (two) times daily between meals. 11/05/17   Epifanio Lesches, MD    Allergies as of 04/23/2019 - Review Complete 04/23/2019  Allergen Reaction Noted  . Ivp dye [iodinated diagnostic agents] Hives 04/14/2015  . Morphine and related Nausea And Vomiting 04/14/2015  . Sulfa antibiotics Other (See Comments) 04/14/2015  . Latex Rash 04/14/2015    Family History  Problem  Relation Age of Onset  . Diabetes Mellitus II Father     Social History   Socioeconomic History  . Marital status: Single    Spouse name: Not on file  . Number of children: Not on file  . Years of education: Not on file  . Highest education level: Not on file  Occupational History  . Occupation: disabled  Social Needs  . Financial resource strain: Not on file  . Food insecurity    Worry: Not on file    Inability: Not on file  . Transportation needs    Medical: Not on file    Non-medical: Not on file  Tobacco Use  . Smoking status: Never Smoker  . Smokeless tobacco: Never Used  Substance and Sexual Activity  . Alcohol use: No  . Drug use: No  . Sexual activity: Never  Lifestyle  . Physical activity    Days per week: Not on file    Minutes per session: Not on file  . Stress: Not on file  Relationships  . Social Herbalist on phone: Not on file    Gets together: Not on file    Attends religious service: Not on file    Active member of club or organization: Not on file    Attends meetings of clubs or organizations: Not on file    Relationship status: Not on file  . Intimate partner violence  Fear of current or ex partner: Not on file    Emotionally abused: Not on file    Physically abused: Not on file    Forced sexual activity: Not on file  Other Topics Concern  . Not on file  Social History Narrative  . Not on file    Review of Systems: See HPI, otherwise negative ROS  Physical Exam: BP 137/79   Pulse 89   Temp 97.8 F (36.6 C) (Tympanic)   Resp 17   Wt 56 kg   SpO2 100%   BMI 21.87 kg/m  General:   Alert,  pleasant and cooperative in NAD Head:  Normocephalic and atraumatic. Neck:  Supple; no masses or thyromegaly. Lungs:  Clear throughout to auscultation, normal respiratory effort.    Heart:  +S1, +S2, Regular rate and rhythm, No edema. Abdomen:  Soft, nontender and nondistended. Normal bowel sounds, without guarding, and without  rebound.  LUQ colostomy bag with melena seen  Neurologic:  Alert and  oriented x4;  grossly normal neurologically.  Impression/Plan: [name] is here for an endoscopy  to be performed for  evaluation of melena    Risks, benefits, limitations, and alternatives regarding endoscopy have been reviewed with the patient.  Questions have been answered.  All parties agreeable.   Jonathon Bellows, MD  04/27/2019, 12:51 PM

## 2019-04-28 LAB — BASIC METABOLIC PANEL
Anion gap: 9 (ref 5–15)
BUN: 15 mg/dL (ref 6–20)
CO2: 16 mmol/L — ABNORMAL LOW (ref 22–32)
Calcium: 8.4 mg/dL — ABNORMAL LOW (ref 8.9–10.3)
Chloride: 117 mmol/L — ABNORMAL HIGH (ref 98–111)
Creatinine, Ser: 0.97 mg/dL (ref 0.61–1.24)
GFR calc Af Amer: >60 mL/min (ref >60)
GFR calc non Af Amer: >60 mL/min (ref >60)
Glucose, Bld: 85 mg/dL (ref 70–99)
Potassium: 2.9 mmol/L — ABNORMAL LOW (ref 3.5–5.1)
Sodium: 142 mmol/L (ref 135–145)

## 2019-04-28 LAB — CBC
HCT: 32.7 % — ABNORMAL LOW (ref 39.0–52.0)
Hemoglobin: 9.5 g/dL — ABNORMAL LOW (ref 13.0–17.0)
MCH: 26.5 pg (ref 26.0–34.0)
MCHC: 29.1 g/dL — ABNORMAL LOW (ref 30.0–36.0)
MCV: 91.3 fL (ref 80.0–100.0)
Platelets: 300 10*3/uL (ref 150–400)
RBC: 3.58 MIL/uL — ABNORMAL LOW (ref 4.22–5.81)
RDW: 15 % (ref 11.5–15.5)
WBC: 8.4 10*3/uL (ref 4.0–10.5)
nRBC: 0 % (ref 0.0–0.2)

## 2019-04-28 LAB — CULTURE, BLOOD (ROUTINE X 2): Culture: NO GROWTH

## 2019-04-28 LAB — MAGNESIUM: Magnesium: 1.9 mg/dL (ref 1.7–2.4)

## 2019-04-28 MED ORDER — ORAL CARE MOUTH RINSE: 15.0000 mL | Freq: Two times a day (BID) | OROMUCOSAL | Status: DC

## 2019-04-28 MED ORDER — POTASSIUM CHLORIDE CRYS ER 20 MEQ PO TBCR
40.0000 meq | EXTENDED_RELEASE_TABLET | Freq: Once | ORAL | Status: AC
  Administered 2019-04-28: 40 meq via ORAL
  Filled 2019-04-28: qty 2

## 2019-04-28 MED ORDER — CHLORHEXIDINE GLUCONATE 0.12 % MT SOLN
15.0000 mL | Freq: Two times a day (BID) | OROMUCOSAL | Status: DC
  Administered 2019-04-28: 15 mL via OROMUCOSAL
  Filled 2019-04-28 (×3): qty 15

## 2019-04-28 NOTE — Progress Notes (Signed)
[place] [facility] [place] NAME: [name]    MR#:  [id number]  DATE OF BIRTH:  08-04-1966  SUBJECTIVE:   No acute events overnight, hemoglobin remained stable.  Status post endoscopy yesterday showing nonbleeding duodenal ulcers and some esophagitis.  Patient wants to try to eat regular food.  REVIEW OF SYSTEMS:    Review of Systems  Constitutional: Negative for chills and fever.  HENT: Negative for congestion and tinnitus.   Eyes: Negative for blurred vision and double vision.  Respiratory: Negative for cough, shortness of breath and wheezing.   Cardiovascular: Negative for chest pain, orthopnea and PND.  Gastrointestinal: Negative for abdominal pain, diarrhea, nausea and vomiting.  Genitourinary: Negative for dysuria and hematuria.  Neurological: Negative for dizziness, sensory change and focal weakness.  All other systems reviewed and are negative.   Nutrition: Soft diet Tolerating Diet: Yes Tolerating PT: pt. Is bedbound at baseline.   DRUG ALLERGIES:   Allergies  Allergen Reactions   Ivp Dye [Iodinated Diagnostic Agents] Hives   Morphine And Related Nausea And Vomiting   Sulfa Antibiotics Other (See Comments)    Reaction: unknown   Latex Rash    VITALS:  Blood pressure 126/75, pulse 91, temperature 98.2 F (36.8 C), temperature source Axillary, resp. rate 17, weight 56 kg, SpO2 100 %.  PHYSICAL EXAMINATION:   Physical Exam  GENERAL:  53 y.o.-year-old patient lying in bed in no acute distress.  EYES: Pupils equal, round, reactive to light and accommodation. No scleral icterus. Extraocular muscles intact.  HEENT: Head atraumatic, normocephalic. Oropharynx and nasopharynx clear.  NECK:  Supple, no jugular venous distention. No thyroid enlargement, no tenderness.  LUNGS: Normal breath sounds bilaterally, no wheezing, rales, rhonchi. No use of accessory muscles of respiration.  CARDIOVASCULAR: S1, S2 normal. No  murmurs, rubs, or gallops.  ABDOMEN: Soft, nontender, nondistended. Bowel sounds present. No organomegaly or mass. + Urostomy/Colostomy in place.  EXTREMITIES: No cyanosis, clubbing or edema b/l. Legs contracted and paraplegia due to spina bifida.     NEUROLOGIC: Cranial nerves II through XII are intact. + spina bifida and pt. Is contracted.  PSYCHIATRIC: The patient is alert and oriented x 3.  SKIN: No obvious rash, lesion, or ulcer.    LABORATORY PANEL:   CBC Recent Labs  Lab 04/28/19 0359  WBC 8.4  HGB 9.5*  HCT 32.7*  PLT 300   ------------------------------------------------------------------------------------------------------------------  Chemistries  Recent Labs  Lab 04/28/19 0359  NA 142  K 2.9*  CL 117*  CO2 16*  GLUCOSE 85  BUN 15  CREATININE 0.97  CALCIUM 8.4*  MG 1.9   ------------------------------------------------------------------------------------------------------------------  Cardiac Enzymes No results for input(s): TROPONINI in the last 168 hours. ------------------------------------------------------------------------------------------------------------------  RADIOLOGY:  Ct Abdomen Pelvis Wo Contrast  Result Date: 04/26/2019 CLINICAL DATA:  Abdominal pain and generalized weakness for 3 days EXAM: CT ABDOMEN AND PELVIS WITHOUT CONTRAST TECHNIQUE: Multidetector CT imaging of the abdomen and pelvis was performed following the standard protocol without IV contrast. COMPARISON:  11/02/2017 FINDINGS: Lower chest: No acute abnormality. Hepatobiliary: No focal liver abnormality is seen. No gallstones, gallbladder wall thickening, or biliary dilatation. Pancreas: Unremarkable. No pancreatic ductal dilatation or surrounding inflammatory changes. Spleen: Normal in size without focal abnormality. Adrenals/Urinary Tract: Adrenal glands are well visualized bilaterally. Bilateral staghorn calculi are identified significantly increased particularly on the left when  compared with the prior exam. There is chronic stable hydronephrosis of the left kidney identified without definitive obstructive lesion. Ileostomy  is noted in the right lower quadrant. Some densities are noted within which were not seen on the prior exam and may represent some recently passed stones. Bladder has been surgically removed. Stomach/Bowel: Colostomy is noted in the left mid abdomen. No obstructive or inflammatory changes of the bowel are seen. The appendix is not well visualized. Stomach is well distended with ingested food stuffs. No small bowel abnormality is seen. Vascular/Lymphatic: No significant vascular findings are present. No enlarged abdominal or pelvic lymph nodes. Reproductive: Prostate is unremarkable. Other: No free pelvic fluid is noted. Musculoskeletal: Chronic degenerative changes are noted about the hip joints. There are changes consistent with the known history of spina bifida. No acute bony abnormality is seen. IMPRESSION: Changes of prior ileostomy and colostomy. Chronic stable hydronephrosis in the left kidney. Bilateral staghorn calculi are noted which have increased significantly on the left when compared with the prior exam. Some densities are noted within the ileal conduit which may be related to passed stones. Chronic changes as described above.  No acute abnormality is noted. Electronically Signed   By: Inez Catalina M.D.   On: 04/26/2019 16:57     ASSESSMENT AND PLAN:   53 year old male with past medical history of spina bifida, previous history of decubitus ulcers, previous history of UTI who presented to the hospital due to malaise nausea and noted to have also dark-colored stools.  1.  Sepsis-suspected to be secondary to UTI. - Patient treated with IV Rocephin and has clinically improved.  Currently afebrile and hemodynamically stable. -Urine cultures are positive for Providencia stuartii and sensitive to ceftriaxone. -Blood cultures remain negative.  2.   UTI-source of patient's sepsis. -Urine cultures positive for providencia stuartii.  Sensitive to Rocephin and will cont.   3.  GI bleed-patient noted to have some black tarry stools in his colostomy bag. -Seen by gastroenterology and status post endoscopy yesterday showing nonbleeding duodenal ulcers and esophagitis. -Continue PPI.  Hemoglobin stable.  4.  GERD-continue Protonix.  5.  Hypernatremia-much improved with half-normal saline.  Patient is taking p.o. well.  Will DC IV fluids for now.  6.  Hypokalemia- will replace orally and repeat level in the morning. -Check magnesium level.  Discussed plan of care with patient's sister over the phone.  All the records are reviewed and case discussed with Care Management/Social Worker. Management plans discussed with the patient, family and they are in agreement.  CODE STATUS: Full code  TOTAL TIME TAKING CARE OF THIS PATIENT: 30 minutes.   POSSIBLE D/C IN 1-2 DAYS, DEPENDING ON CLINICAL CONDITION.   Henreitta Leber M.D on 04/28/2019 at 1:55 PM  Between 7am to 6pm - Pager - [phone]  After 6pm go to [email or web address] - Proofreader  [facility]  Office  [phone]  CC: Primary care physician; Idelle Crouch, MD

## 2019-04-28 NOTE — Progress Notes (Signed)
Pt has remained alert and oriented with no c/o pain. Pt has remained on RA, SpO2 > 90%, lung sounds diminished to auscultation. Tele d/c today- NSR on cardiac monitor. Pt transitioned to a soft diet; although, he has not ate a complete meal- he is drinking plenty of fluids. No further black, tarry stools today.

## 2019-04-28 NOTE — Progress Notes (Signed)
[facility] Inpatient Progress Note  Subjective: Patient seen for follow-up of GI bleed and duodenal ulcers.  Patient appears to be in no distress today and denies any abdominal pain.  He has been on a liquid diet up until today, the hospital is has advance to solid food.  Has had no significant bleeding, some black stool in the colostomy bag is all.  Duodenal ulcers were white based without stigmata of active or recent bleeding.  Objective: Vital signs in last 24 hours: Temp:  [97.4 F (36.3 C)-100 F (37.8 C)] 98.2 F (36.8 C) (07/25 0800) Pulse Rate:  [82-102] 91 (07/25 0800) Resp:  [17-29] 17 (07/25 0800) BP: (111-145)/(74-94) 126/75 (07/25 0800) SpO2:  [97 %-100 %] 100 % (07/25 0800) Blood pressure 126/75, pulse 91, temperature 98.2 F (36.8 C), temperature source Axillary, resp. rate 17, weight 56 kg, SpO2 100 %.    Intake/Output from previous day: 07/24 0701 - 07/25 0700 In: 2151.6 [P.O.:740; I.V.:1111.6; IV Piggyback:300] Out: 4010 [Urine:3050; Stool:500]  Intake/Output this shift: Total I/O In: 60 [P.O.:60] Out: -    General appearance:  Alert with strabismus. Resp:  CTA Cardio:  RRR GI:  Soft, mildly distended with minimal tenderness. BS+ Functioning colostomy. Extremities:  No edema.    Lab Results: Results for orders placed or performed during the hospital encounter of 04/23/19 (from the past 24 hour(s))  Basic metabolic panel     Status: Abnormal   Collection Time: 04/28/19  3:59 AM  Result Value Ref Range   Sodium 142 135 - 145 mmol/L   Potassium 2.9 (L) 3.5 - 5.1 mmol/L   Chloride 117 (H) 98 - 111 mmol/L   CO2 16 (L) 22 - 32 mmol/L   Glucose, Bld 85 70 - 99 mg/dL   BUN 15 6 - 20 mg/dL   Creatinine, Ser 0.97 0.61 - 1.24 mg/dL   Calcium 8.4 (L) 8.9 - 10.3 mg/dL   GFR calc non Af Amer >60 >60 mL/min   GFR calc Af Amer >60 >60 mL/min   Anion gap 9 5 - 15  CBC     Status: Abnormal   Collection Time: 04/28/19  3:59 AM  Result Value  Ref Range   WBC 8.4 4.0 - 10.5 K/uL   RBC 3.58 (L) 4.22 - 5.81 MIL/uL   Hemoglobin 9.5 (L) 13.0 - 17.0 g/dL   HCT 32.7 (L) 39.0 - 52.0 %   MCV 91.3 80.0 - 100.0 fL   MCH 26.5 26.0 - 34.0 pg   MCHC 29.1 (L) 30.0 - 36.0 g/dL   RDW 15.0 11.5 - 15.5 %   Platelets 300 150 - 400 K/uL   nRBC 0.0 0.0 - 0.2 %  Magnesium     Status: None   Collection Time: 04/28/19  3:59 AM  Result Value Ref Range   Magnesium 1.9 1.7 - 2.4 mg/dL     Recent Labs    04/26/19 2308 04/27/19 0526 04/28/19 0359  WBC  --  10.5 8.4  HGB 9.7* 9.8* 9.5*  HCT 31.6* 32.0* 32.7*  PLT  --  277 300   BMET Recent Labs    04/26/19 0257 04/27/19 0526 04/28/19 0359  NA 148* 151* 142  K 3.4* 4.1 2.9*  CL 121* 128* 117*  CO2 19* 13* 16*  GLUCOSE 94 76 85  BUN 20 27* 15  CREATININE 1.01 0.98 0.97  CALCIUM 8.7* 8.6* 8.4*   LFT No results for input(s): PROT, ALBUMIN, AST, ALT, ALKPHOS, BILITOT, BILIDIR, IBILI in  the last 72 hours. PT/INR No results for input(s): LABPROT, INR in the last 72 hours. Hepatitis Panel No results for input(s): HEPBSAG, HCVAB, HEPAIGM, HEPBIGM in the last 72 hours. C-Diff No results for input(s): CDIFFTOX in the last 72 hours. No results for input(s): CDIFFPCR in the last 72 hours.   Studies/Results: Ct Abdomen Pelvis Wo Contrast  Result Date: 04/26/2019 CLINICAL DATA:  Abdominal pain and generalized weakness for 3 days EXAM: CT ABDOMEN AND PELVIS WITHOUT CONTRAST TECHNIQUE: Multidetector CT imaging of the abdomen and pelvis was performed following the standard protocol without IV contrast. COMPARISON:  11/02/2017 FINDINGS: Lower chest: No acute abnormality. Hepatobiliary: No focal liver abnormality is seen. No gallstones, gallbladder wall thickening, or biliary dilatation. Pancreas: Unremarkable. No pancreatic ductal dilatation or surrounding inflammatory changes. Spleen: Normal in size without focal abnormality. Adrenals/Urinary Tract: Adrenal glands are well visualized bilaterally.  Bilateral staghorn calculi are identified significantly increased particularly on the left when compared with the prior exam. There is chronic stable hydronephrosis of the left kidney identified without definitive obstructive lesion. Ileostomy is noted in the right lower quadrant. Some densities are noted within which were not seen on the prior exam and may represent some recently passed stones. Bladder has been surgically removed. Stomach/Bowel: Colostomy is noted in the left mid abdomen. No obstructive or inflammatory changes of the bowel are seen. The appendix is not well visualized. Stomach is well distended with ingested food stuffs. No small bowel abnormality is seen. Vascular/Lymphatic: No significant vascular findings are present. No enlarged abdominal or pelvic lymph nodes. Reproductive: Prostate is unremarkable. Other: No free pelvic fluid is noted. Musculoskeletal: Chronic degenerative changes are noted about the hip joints. There are changes consistent with the known history of spina bifida. No acute bony abnormality is seen. IMPRESSION: Changes of prior ileostomy and colostomy. Chronic stable hydronephrosis in the left kidney. Bilateral staghorn calculi are noted which have increased significantly on the left when compared with the prior exam. Some densities are noted within the ileal conduit which may be related to passed stones. Chronic changes as described above.  No acute abnormality is noted. Electronically Signed   By: Inez Catalina M.D.   On: 04/26/2019 16:57    Scheduled Inpatient Medications:   . chlorhexidine  15 mL Mouth Rinse BID  . mouth rinse  15 mL Mouth Rinse q12n4p  . multivitamin with minerals   Oral Daily  . pantoprazole (PROTONIX) IV  40 mg Intravenous Q12H  . protein supplement shake  11 oz Oral BID BM    Continuous Inpatient Infusions:   . sodium chloride 100 mL/hr at 04/28/19 0700  . cefTRIAXone (ROCEPHIN)  IV Stopped (04/27/19 1232)    PRN Inpatient Medications:   acetaminophen **OR** acetaminophen, ondansetron **OR** ondansetron (ZOFRAN) IV, oxyCODONE, prochlorperazine, traZODone    Assessment:  #1.  GI bleed-stable.  Secondary to peptic ulcer disease.  PPI therapy continues. 2.  Duodenal ulcers, as above. 3.  Urinary tract infection with sepsis.-On Rocephin.  Plan:  1.  Continue diet as tolerated. 2.  GI will sign off for now.  Patient should follow-up with GI as needed in the interim. PPI therapy for at least 3 months with outpatient  GI follow up needed.   Nili Honda K. Alice Reichert, M.D. 04/28/2019, 12:19 PM

## 2019-04-29 LAB — BASIC METABOLIC PANEL
Anion gap: 8 (ref 5–15)
BUN: 11 mg/dL (ref 6–20)
CO2: 18 mmol/L — ABNORMAL LOW (ref 22–32)
Calcium: 8.9 mg/dL (ref 8.9–10.3)
Chloride: 118 mmol/L — ABNORMAL HIGH (ref 98–111)
Creatinine, Ser: 1.09 mg/dL (ref 0.61–1.24)
GFR calc Af Amer: >60 mL/min (ref >60)
GFR calc non Af Amer: >60 mL/min (ref >60)
Glucose, Bld: 103 mg/dL — ABNORMAL HIGH (ref 70–99)
Potassium: 3.3 mmol/L — ABNORMAL LOW (ref 3.5–5.1)
Sodium: 144 mmol/L (ref 135–145)

## 2019-04-29 LAB — HEMOGLOBIN: Hemoglobin: 10.1 g/dL — ABNORMAL LOW (ref 13.0–17.0)

## 2019-04-29 LAB — H. PYLORI ANTIGEN, STOOL: H. Pylori Stool Ag, Eia: NEGATIVE

## 2019-04-29 LAB — MAGNESIUM: Magnesium: 2 mg/dL (ref 1.7–2.4)

## 2019-04-29 MED ORDER — CEFUROXIME AXETIL 250 MG PO TABS: 250.0000 mg | ORAL_TABLET | Freq: Two times a day (BID) | ORAL | 0 refills | Status: AC

## 2019-04-29 MED ORDER — PANTOPRAZOLE SODIUM 40 MG PO TBEC: 40.0000 mg | DELAYED_RELEASE_TABLET | Freq: Every day | ORAL | 1 refills | Status: DC

## 2019-04-29 NOTE — Progress Notes (Signed)
Patient discharged to home with family to provide care. DC'd with his own wheelchair. All belongings returned. Prescriptions and DC paperwork in hand. Patient and family both deny questions or concerns at this time.

## 2019-04-29 NOTE — Discharge Summary (Signed)
[facility] [place] NAME: [name]    MR#:  [id number]  DATE OF BIRTH:  01/18/1966  DATE OF ADMISSION:  04/23/2019 ADMITTING PHYSICIAN: Christel Mormon, MD  DATE OF DISCHARGE: 04/29/2019  PRIMARY CARE PHYSICIAN: Idelle Crouch, MD    ADMISSION DIAGNOSIS:  Lower urinary tract infectious disease [N39.0]  DISCHARGE DIAGNOSIS:  Active Problems:   Sepsis ([place])   SECONDARY DIAGNOSIS:   Past Medical History:  Diagnosis Date  . Decubitus ulcer   . Renal disorder   . Spina bifida [facility])     HOSPITAL COURSE:   53 year old male with past medical history of spina bifida, previous history of decubitus ulcers, previous history of UTI who presented to the hospital due to malaise nausea and noted to have also dark-colored stools.  1.  Sepsis-suspected to be secondary to UTI. -Patient was treated with IV Rocephin and has clinically improved.  Currently afebrile and hemodynamically stable.   -Urine cultures are positive for Providencia stuartii and it's sensitive to ceftriaxone and will discharge on Oral Ceftin.  -Blood cultures remain negative.  2.  UTI-source of patient's sepsis. -Urine cultures positive for providencia stuartii.  Sensitive to Rocephin and will discharge on Oral Ceftin.   3.  GI bleed-patient noted to have some black tarry stools in his colostomy bag on admission and continues to have it.  -Seen by gastroenterology and status post endoscopy showing nonbleeding duodenal ulcers and esophagitis. Hemoglobin remained stable and patient will be discharged on ora Protonix  4.  GERD- pt. Will continue Protonix.  5.  Hypernatremia-much improved and resolved with 1/2 NS and stable now.   6.  Hypokalemia-  Resolved with supplementation.  Patient's magnesium level is normal.  We will discharge home today.  DISCHARGE CONDITIONS:   Stable  CONSULTS OBTAINED:  Treatment Team:  Jonathon Bellows, MD  DRUG ALLERGIES:    Allergies  Allergen Reactions  . Ivp Dye [Iodinated Diagnostic Agents] Hives  . Morphine And Related Nausea And Vomiting  . Sulfa Antibiotics Other (See Comments)    Reaction: unknown  . Latex Rash    DISCHARGE MEDICATIONS:   Allergies as of 04/29/2019      Reactions   Ivp Dye [iodinated Diagnostic Agents] Hives   Morphine And Related Nausea And Vomiting   Sulfa Antibiotics Other (See Comments)   Reaction: unknown   Latex Rash      Medication List    TAKE these medications   cefUROXime 250 MG tablet Commonly known as: CEFTIN Take 1 tablet (250 mg total) by mouth 2 (two) times daily with a meal for 2 days.   CENTRUM MEN PO Take 1 tablet by mouth daily.   ondansetron 4 MG tablet Commonly known as: ZOFRAN Take 4 mg by mouth every 8 (eight) hours as needed for nausea.   oxyCODONE 5 MG immediate release tablet Commonly known as: Oxy IR/ROXICODONE Take 5 mg by mouth every 4 (four) hours as needed for pain.   pantoprazole 40 MG tablet Commonly known as: PROTONIX Take 1 tablet (40 mg total) by mouth daily.   protein supplement shake Liqd Commonly known as: PREMIER PROTEIN Take 325 mLs (11 oz total) by mouth 2 (two) times daily between meals.         DISCHARGE INSTRUCTIONS:   DIET:  Regular diet  DISCHARGE CONDITION:  Stable  ACTIVITY:  Activity as tolerated  OXYGEN:  Home Oxygen: No.   Oxygen Delivery: room air  DISCHARGE LOCATION:  home   If you experience worsening of your admission symptoms, develop shortness of breath, life threatening emergency, suicidal or homicidal thoughts you must seek medical attention immediately by calling 911 or calling your MD immediately  if symptoms less severe.  You Must read complete instructions/literature along with all the possible adverse reactions/side effects for all the Medicines you take and that have been prescribed to you. Take any new Medicines after you have completely understood and accpet all the  possible adverse reactions/side effects.   Please note  You were cared for by a hospitalist during your hospital stay. If you have any questions about your discharge medications or the care you received while you were in the hospital after you are discharged, you can call the unit and asked to speak with the hospitalist on call if the hospitalist that took care of you is not available. Once you are discharged, your primary care physician will handle any further medical issues. Please note that NO REFILLS for any discharge medications will be authorized once you are discharged, as it is imperative that you return to your primary care physician (or establish a relationship with a primary care physician if you do not have one) for your aftercare needs so that they can reassess your need for medications and monitor your lab values.     Today    VITAL SIGNS:  Blood pressure (!) 135/99, pulse 96, temperature 97.7 F (36.5 C), temperature source Oral, resp. rate 17, weight 56 kg, SpO2 99 %.  I/O:    Intake/Output Summary (Last 24 hours) at 04/29/2019 1317 Last data filed at 04/29/2019 0900 Gross per 24 hour  Intake 1023.64 ml  Output 3690 ml  Net -2666.36 ml    PHYSICAL EXAMINATION:   GENERAL:  53 y.o.-year-old patient lying in bed in no acute distress.  EYES: Pupils equal, round, reactive to light and accommodation. No scleral icterus. Extraocular muscles intact.  HEENT: Head atraumatic, normocephalic. Oropharynx and nasopharynx clear.  NECK:  Supple, no jugular venous distention. No thyroid enlargement, no tenderness.  LUNGS: Normal breath sounds bilaterally, no wheezing, rales, rhonchi. No use of accessory muscles of respiration.  CARDIOVASCULAR: S1, S2 normal. No murmurs, rubs, or gallops.  ABDOMEN: Soft, nontender, nondistended. Bowel sounds present. No organomegaly or mass. + Urostomy/Colostomy in place.  EXTREMITIES: No cyanosis, clubbing or edema b/l. Legs contracted and paraplegia  due to spina bifida.     NEUROLOGIC: Cranial nerves II through XII are intact. + spina bifida and pt. Is contracted.  PSYCHIATRIC: The patient is alert and oriented x 3.  SKIN: No obvious rash, lesion, or ulcer.   DATA REVIEW:   CBC Recent Labs  Lab 04/28/19 0359 04/29/19 0514  WBC 8.4  --   HGB 9.5* 10.1*  HCT 32.7*  --   PLT 300  --     Chemistries  Recent Labs  Lab 04/29/19 0514  NA 144  K 3.3*  CL 118*  CO2 18*  GLUCOSE 103*  BUN 11  CREATININE 1.09  CALCIUM 8.9  MG 2.0    Cardiac Enzymes No results for input(s): TROPONINI in the last 168 hours.  Microbiology Results  Results for orders placed or performed during the hospital encounter of 04/23/19  Blood culture (routine x 2)     Status: Abnormal   Collection Time: 04/23/19  8:07 PM   Specimen: BLOOD  Result Value Ref Range Status   Specimen Description   Final    BLOOD RIGHT ANTECUBITAL Performed at  [facility], [address] [place], [place] [address]    Special Requests   Final    BOTTLES DRAWN AEROBIC AND ANAEROBIC Blood Culture adequate volume Performed at [facility], [address] [place], [place] [address]    Culture  Setup Time   Final    Organism ID to follow GRAM NEGATIVE RODS IN BOTH AEROBIC AND ANAEROBIC BOTTLES CRITICAL RESULT CALLED TO, READ BACK BY AND VERIFIED WITH:  CHRISTINE KATSOUDAS ON 04/24/2019 AT 1234. TIK Performed at [facility], [address] [place], [place] [address]    Culture PROVIDENCIA STUARTII (A)  Final   Report Status 04/26/2019 FINAL  Final   Organism ID, Bacteria PROVIDENCIA STUARTII  Final      Susceptibility   Providencia stuartii - MIC*    AMPICILLIN >=32 RESISTANT Resistant     CEFAZOLIN >=64 RESISTANT Resistant     CEFEPIME <=1 SENSITIVE Sensitive     CEFTAZIDIME <=1 SENSITIVE Sensitive     CEFTRIAXONE <=1 SENSITIVE Sensitive     CIPROFLOXACIN >=4 RESISTANT Resistant     GENTAMICIN RESISTANT Resistant      IMIPENEM 4 SENSITIVE Sensitive     TRIMETH/SULFA <=20 SENSITIVE Sensitive     AMPICILLIN/SULBACTAM >=32 RESISTANT Resistant     PIP/TAZO <=4 SENSITIVE Sensitive     * PROVIDENCIA STUARTII  Blood culture (routine x 2)     Status: None   Collection Time: 04/23/19  8:07 PM   Specimen: BLOOD  Result Value Ref Range Status   Specimen Description BLOOD BLOOD LEFT HAND  Final   Special Requests   Final    BOTTLES DRAWN AEROBIC AND ANAEROBIC Blood Culture results may not be optimal due to an excessive volume of blood received in culture bottles   Culture   Final    NO GROWTH 5 DAYS Performed at [facility], [address], [place], [place] [address]    Report Status 04/28/2019 FINAL  Final  SARS Coronavirus 2 (CEPHEID- Performed in [place] hospital lab), Hosp Order     Status: None   Collection Time: 04/23/19  8:07 PM   Specimen: Nasopharyngeal Swab  Result Value Ref Range Status   SARS Coronavirus 2 NEGATIVE NEGATIVE Final    Comment: (NOTE) If result is NEGATIVE SARS-CoV-2 target nucleic acids are NOT DETECTED. The SARS-CoV-2 RNA is generally detectable in upper and lower  respiratory specimens during the acute phase of infection. The lowest  concentration of SARS-CoV-2 viral copies this assay can detect is 250  copies / mL. A negative result does not preclude SARS-CoV-2 infection  and should not be used as the sole basis for treatment or other  patient management decisions.  A negative result may occur with  improper specimen collection / handling, submission of specimen other  than nasopharyngeal swab, presence of viral mutation(s) within the  areas targeted by this assay, and inadequate number of viral copies  (<250 copies / mL). A negative result must be combined with clinical  observations, patient history, and epidemiological information. If result is POSITIVE SARS-CoV-2 target nucleic acids are DETECTED. The SARS-CoV-2 RNA is generally detectable in upper and  lower  respiratory specimens dur ing the acute phase of infection.  Positive  results are indicative of active infection with SARS-CoV-2.  Clinical  correlation with patient history and other diagnostic information is  necessary to determine patient infection status.  Positive results do  not rule out bacterial infection or co-infection with other viruses. If result is  PRESUMPTIVE POSTIVE SARS-CoV-2 nucleic acids MAY BE PRESENT.   A presumptive positive result was obtained on the submitted specimen  and confirmed on repeat testing.  While 2019 novel coronavirus  (SARS-CoV-2) nucleic acids may be present in the submitted sample  additional confirmatory testing may be necessary for epidemiological  and / or clinical management purposes  to differentiate between  SARS-CoV-2 and other Sarbecovirus currently known to infect humans.  If clinically indicated additional testing with an alternate test  methodology [phone]) is advised. The SARS-CoV-2 RNA is generally  detectable in upper and lower respiratory sp ecimens during the acute  phase of infection. The expected result is Negative. Fact Sheet for Patients:  StrictlyIdeas.no Fact Sheet for Healthcare Providers: BankingDealers.co.za This test is not yet approved or cleared by the [place] FDA and has been authorized for detection and/or diagnosis of SARS-CoV-2 by FDA under an Emergency Use Authorization (EUA).  This EUA will remain in effect (meaning this test can be used) for the duration of the COVID-19 declaration under Section 564(b)(1) of the Act, 21 U.S.C. section 360bbb-3(b)(1), unless the authorization is terminated or revoked sooner. Performed at [facility], [address] [place], [place] [address]   Blood Culture ID Panel (Reflexed)     Status: None   Collection Time: 04/23/19  8:07 PM  Result Value Ref Range Status   Enterococcus species NOT DETECTED NOT  DETECTED Final   Listeria monocytogenes NOT DETECTED NOT DETECTED Final   Staphylococcus species NOT DETECTED NOT DETECTED Final   Staphylococcus aureus (BCID) NOT DETECTED NOT DETECTED Final   Streptococcus species NOT DETECTED NOT DETECTED Final   Streptococcus agalactiae NOT DETECTED NOT DETECTED Final   Streptococcus pneumoniae NOT DETECTED NOT DETECTED Final   Streptococcus pyogenes NOT DETECTED NOT DETECTED Final   Acinetobacter baumannii NOT DETECTED NOT DETECTED Final   Enterobacteriaceae species NOT DETECTED NOT DETECTED Final   Enterobacter cloacae complex NOT DETECTED NOT DETECTED Final   Escherichia coli NOT DETECTED NOT DETECTED Final   Klebsiella oxytoca NOT DETECTED NOT DETECTED Final   Klebsiella pneumoniae NOT DETECTED NOT DETECTED Final   Proteus species NOT DETECTED NOT DETECTED Final   Serratia marcescens NOT DETECTED NOT DETECTED Final   Haemophilus influenzae NOT DETECTED NOT DETECTED Final   Neisseria meningitidis NOT DETECTED NOT DETECTED Final   Pseudomonas aeruginosa NOT DETECTED NOT DETECTED Final   Candida albicans NOT DETECTED NOT DETECTED Final   Candida glabrata NOT DETECTED NOT DETECTED Final   Candida krusei NOT DETECTED NOT DETECTED Final   Candida parapsilosis NOT DETECTED NOT DETECTED Final   Candida tropicalis NOT DETECTED NOT DETECTED Final    Comment: Performed at [facility], [address] [place], [place] [address]  Urine culture     Status: Abnormal   Collection Time: 04/23/19  9:07 PM   Specimen: Urine, Random  Result Value Ref Range Status   Specimen Description   Final    URINE, RANDOM Performed at [facility], [address] [place], [place] [address]    Special Requests   Final    NONE Performed at [facility], [address] [place], [place] [address]    Culture MULTIPLE SPECIES PRESENT, SUGGEST RECOLLECTION (A)  Final   Report Status 04/24/2019 FINAL  Final  CULTURE, BLOOD (ROUTINE X 2) w  Reflex to ID Panel     Status: None (Preliminary result)   Collection Time: 04/26/19  9:47 AM   Specimen: BLOOD  Result Value Ref  Range Status   Specimen Description BLOOD BLOOD LEFT WRIST  Final   Special Requests   Final    BOTTLES DRAWN AEROBIC AND ANAEROBIC Blood Culture adequate volume   Culture   Final    NO GROWTH 3 DAYS Performed at [facility], [address], [place], [place] [address]    Report Status PENDING  Incomplete  CULTURE, BLOOD (ROUTINE X 2) w Reflex to ID Panel     Status: None (Preliminary result)   Collection Time: 04/26/19  9:50 AM   Specimen: BLOOD  Result Value Ref Range Status   Specimen Description BLOOD BLOOD RIGHT HAND  Final   Special Requests   Final    BOTTLES DRAWN AEROBIC AND ANAEROBIC Blood Culture results may not be optimal due to an inadequate volume of blood received in culture bottles   Culture   Final    NO GROWTH 3 DAYS Performed at [facility], [address], [place],  [address]    Report Status PENDING  Incomplete    RADIOLOGY:  No results found.    Management plans discussed with the patient, family and they are in agreement.  CODE STATUS:     Code Status Orders  (From admission, onward)         Start     Ordered   04/24/19 0035  Full code  Continuous     04/24/19 0036          TOTAL TIME TAKING CARE OF THIS PATIENT: 40 minutes.    Henreitta Leber M.D on 04/29/2019 at 1:17 PM  Between 7am to 6pm - Pager - [phone]  After 6pm go to [email or web address] - Proofreader  [facility]  Hospitalists  Office  [phone]  CC: Primary care physician; Idelle Crouch, MD

## 2019-04-29 NOTE — Plan of Care (Signed)
  Problem: Urinary Elimination: Goal: Signs and symptoms of infection will decrease Outcome: Progressing   Problem: Pain Managment: Goal: General experience of comfort will improve Outcome: Progressing   Problem: Safety: Goal: Ability to remain free from injury will improve Outcome: Progressing   Problem: Skin Integrity: Goal: Risk for impaired skin integrity will decrease Outcome: Progressing   Problem: Education: Goal: Knowledge of General Education information will improve Description: Including pain rating scale, medication(s)/side effects and non-pharmacologic comfort measures Outcome: Progressing

## 2019-04-29 NOTE — Progress Notes (Addendum)
Shift summary:  - Patient denies questions or concerns this AM. - Plan to discharge to home today.

## 2019-04-29 NOTE — Plan of Care (Signed)
Care plan complete

## 2019-05-01 LAB — CULTURE, BLOOD (ROUTINE X 2)
Culture: NO GROWTH
Culture: NO GROWTH
Special Requests: ADEQUATE

## 2019-06-28 ENCOUNTER — Other Ambulatory Visit: Admission: RE | Admit: 2019-06-28 | Payer: Medicare Other | Source: Ambulatory Visit

## 2019-07-02 ENCOUNTER — Ambulatory Visit: Admission: RE | Admit: 2019-07-02 | Payer: Medicare Other | Source: Home / Self Care | Admitting: Gastroenterology

## 2019-07-02 ENCOUNTER — Encounter: Admission: RE | Payer: Self-pay | Source: Home / Self Care

## 2019-07-02 SURGERY — ESOPHAGOGASTRODUODENOSCOPY (EGD) WITH PROPOFOL
Anesthesia: General

## 2019-08-27 DIAGNOSIS — M7582 Other shoulder lesions, left shoulder: Secondary | ICD-10-CM | POA: Insufficient documentation

## 2019-12-22 ENCOUNTER — Other Ambulatory Visit: Payer: Self-pay

## 2019-12-22 ENCOUNTER — Encounter: Payer: Self-pay | Admitting: Emergency Medicine

## 2019-12-22 ENCOUNTER — Emergency Department: Payer: Medicare Other

## 2019-12-22 ENCOUNTER — Inpatient Hospital Stay
Admission: EM | Admit: 2019-12-22 | Discharge: 2019-12-26 | DRG: 872 | Disposition: A | Discharge status: Skilled Nursing Facility | Payer: Medicare Other | Attending: Internal Medicine | Admitting: Internal Medicine

## 2019-12-22 DIAGNOSIS — A419 Sepsis, unspecified organism: Secondary | ICD-10-CM | POA: Diagnosis present

## 2019-12-22 DIAGNOSIS — Z91040 Latex allergy status: Secondary | ICD-10-CM | POA: Diagnosis not present

## 2019-12-22 DIAGNOSIS — N136 Pyonephrosis: Secondary | ICD-10-CM | POA: Diagnosis present

## 2019-12-22 DIAGNOSIS — Q059 Spina bifida, unspecified: Secondary | ICD-10-CM | POA: Diagnosis not present

## 2019-12-22 DIAGNOSIS — Z79891 Long term (current) use of opiate analgesic: Secondary | ICD-10-CM | POA: Diagnosis not present

## 2019-12-22 DIAGNOSIS — R066 Hiccough: Secondary | ICD-10-CM | POA: Diagnosis not present

## 2019-12-22 DIAGNOSIS — E785 Hyperlipidemia, unspecified: Secondary | ICD-10-CM | POA: Diagnosis not present

## 2019-12-22 DIAGNOSIS — Z882 Allergy status to sulfonamides status: Secondary | ICD-10-CM

## 2019-12-22 DIAGNOSIS — Z20822 Contact with and (suspected) exposure to covid-19: Secondary | ICD-10-CM | POA: Diagnosis present

## 2019-12-22 DIAGNOSIS — Z933 Colostomy status: Secondary | ICD-10-CM | POA: Diagnosis not present

## 2019-12-22 DIAGNOSIS — K219 Gastro-esophageal reflux disease without esophagitis: Secondary | ICD-10-CM

## 2019-12-22 DIAGNOSIS — Z885 Allergy status to narcotic agent status: Secondary | ICD-10-CM

## 2019-12-22 DIAGNOSIS — R6520 Severe sepsis without septic shock: Secondary | ICD-10-CM | POA: Diagnosis not present

## 2019-12-22 DIAGNOSIS — Z79899 Other long term (current) drug therapy: Secondary | ICD-10-CM

## 2019-12-22 DIAGNOSIS — N179 Acute kidney failure, unspecified: Secondary | ICD-10-CM | POA: Diagnosis present

## 2019-12-22 DIAGNOSIS — Z87442 Personal history of urinary calculi: Secondary | ICD-10-CM | POA: Diagnosis not present

## 2019-12-22 DIAGNOSIS — R110 Nausea: Secondary | ICD-10-CM | POA: Diagnosis not present

## 2019-12-22 DIAGNOSIS — Z936 Other artificial openings of urinary tract status: Secondary | ICD-10-CM

## 2019-12-22 DIAGNOSIS — N390 Urinary tract infection, site not specified: Secondary | ICD-10-CM | POA: Diagnosis not present

## 2019-12-22 DIAGNOSIS — Z91041 Radiographic dye allergy status: Secondary | ICD-10-CM

## 2019-12-22 DIAGNOSIS — H509 Unspecified strabismus: Secondary | ICD-10-CM | POA: Diagnosis present

## 2019-12-22 DIAGNOSIS — R112 Nausea with vomiting, unspecified: Secondary | ICD-10-CM | POA: Diagnosis not present

## 2019-12-22 DIAGNOSIS — R000 Tachycardia, unspecified: Secondary | ICD-10-CM | POA: Diagnosis not present

## 2019-12-22 HISTORY — DX: Gastro-esophageal reflux disease without esophagitis: K21.9

## 2019-12-22 HISTORY — DX: Urinary tract infection, site not specified: N39.0

## 2019-12-22 LAB — CBC WITH DIFFERENTIAL/PLATELET
Abs Immature Granulocytes: 0.05 10*3/uL (ref 0.00–0.07)
Basophils Absolute: 0.1 10*3/uL (ref 0.0–0.1)
Basophils Relative: 1 %
Eosinophils Absolute: 0.7 10*3/uL — ABNORMAL HIGH (ref 0.0–0.5)
Eosinophils Relative: 5 %
HCT: 48.1 % (ref 39.0–52.0)
Hemoglobin: 15 g/dL (ref 13.0–17.0)
Immature Granulocytes: 0 %
Lymphocytes Relative: 4 %
Lymphs Abs: 0.4 10*3/uL — ABNORMAL LOW (ref 0.7–4.0)
MCH: 27.6 pg (ref 26.0–34.0)
MCHC: 31.2 g/dL (ref 30.0–36.0)
MCV: 88.4 fL (ref 80.0–100.0)
Monocytes Absolute: 0.8 10*3/uL (ref 0.1–1.0)
Monocytes Relative: 6 %
Neutro Abs: 10.5 10*3/uL — ABNORMAL HIGH (ref 1.7–7.7)
Neutrophils Relative %: 84 %
Platelets: 288 10*3/uL (ref 150–400)
RBC: 5.44 MIL/uL (ref 4.22–5.81)
RDW: 14.6 % (ref 11.5–15.5)
Smear Review: ADEQUATE
WBC: 12.5 10*3/uL — ABNORMAL HIGH (ref 4.0–10.5)
nRBC: 0 % (ref 0.0–0.2)

## 2019-12-22 LAB — COMPREHENSIVE METABOLIC PANEL
ALT: 31 U/L (ref 0–44)
AST: 39 U/L (ref 15–41)
Albumin: 4.1 g/dL (ref 3.5–5.0)
Alkaline Phosphatase: 111 U/L (ref 38–126)
Anion gap: 16 — ABNORMAL HIGH (ref 5–15)
BUN: 25 mg/dL — ABNORMAL HIGH (ref 6–20)
CO2: 21 mmol/L — ABNORMAL LOW (ref 22–32)
Calcium: 9.5 mg/dL (ref 8.9–10.3)
Chloride: 102 mmol/L (ref 98–111)
Creatinine, Ser: 1.62 mg/dL — ABNORMAL HIGH (ref 0.61–1.24)
GFR calc Af Amer: 56 mL/min — ABNORMAL LOW (ref >60)
GFR calc non Af Amer: 48 mL/min — ABNORMAL LOW (ref >60)
Glucose, Bld: 83 mg/dL (ref 70–99)
Potassium: 4.5 mmol/L (ref 3.5–5.1)
Sodium: 139 mmol/L (ref 135–145)
Total Bilirubin: 1.3 mg/dL — ABNORMAL HIGH (ref 0.3–1.2)
Total Protein: 8.6 g/dL — ABNORMAL HIGH (ref 6.5–8.1)

## 2019-12-22 LAB — PROTIME-INR
INR: 1 (ref 0.8–1.2)
Prothrombin Time: 12.8 seconds (ref 11.4–15.2)

## 2019-12-22 LAB — URINALYSIS, ROUTINE W REFLEX MICROSCOPIC
Bilirubin Urine: NEGATIVE
Glucose, UA: NEGATIVE mg/dL
Ketones, ur: 20 mg/dL — AB
Nitrite: POSITIVE — AB
Protein, ur: 100 mg/dL — AB
RBC / HPF: >50 RBC/hpf — ABNORMAL HIGH (ref 0–5)
Specific Gravity, Urine: 1.01 (ref 1.005–1.030)
Squamous Epithelial / HPF: NONE SEEN (ref 0–5)
WBC, UA: >50 WBC/hpf — ABNORMAL HIGH (ref 0–5)
pH: 7 (ref 5.0–8.0)

## 2019-12-22 LAB — LACTIC ACID, PLASMA: Lactic Acid, Venous: 1.5 mmol/L (ref 0.5–1.9)

## 2019-12-22 LAB — APTT: aPTT: 31 seconds (ref 24–36)

## 2019-12-22 LAB — RESPIRATORY PANEL BY RT PCR (FLU A&B, COVID)
Influenza A by PCR: NEGATIVE
Influenza B by PCR: NEGATIVE
SARS Coronavirus 2 by RT PCR: NEGATIVE

## 2019-12-22 MED ORDER — SODIUM CHLORIDE 0.9 % IV SOLN
2.0000 g | Freq: Once | INTRAVENOUS | Status: AC
  Administered 2019-12-22: 2 g via INTRAVENOUS
  Filled 2019-12-22: qty 2

## 2019-12-22 MED ORDER — ONDANSETRON HCL 4 MG PO TABS: 4.0000 mg | ORAL_TABLET | Freq: Four times a day (QID) | ORAL | Status: DC | PRN

## 2019-12-22 MED ORDER — SODIUM CHLORIDE 0.9 % IV SOLN: INTRAVENOUS | Status: DC

## 2019-12-22 MED ORDER — METRONIDAZOLE IN NACL 5-0.79 MG/ML-% IV SOLN
500.0000 mg | Freq: Once | INTRAVENOUS | Status: AC
  Administered 2019-12-22: 500 mg via INTRAVENOUS
  Filled 2019-12-22: qty 100

## 2019-12-22 MED ORDER — VANCOMYCIN HCL IN DEXTROSE 1-5 GM/200ML-% IV SOLN
1000.0000 mg | Freq: Once | INTRAVENOUS | Status: AC
  Administered 2019-12-22: 13:00:00 1000 mg via INTRAVENOUS
  Filled 2019-12-22: qty 200

## 2019-12-22 MED ORDER — ADULT MULTIVITAMIN W/MINERALS CH
1.0000 | ORAL_TABLET | Freq: Every day | ORAL | Status: DC
  Administered 2019-12-24 – 2019-12-26 (×3): 1 via ORAL
  Filled 2019-12-22 (×5): qty 1

## 2019-12-22 MED ORDER — ACETAMINOPHEN 500 MG PO TABS
1000.0000 mg | ORAL_TABLET | Freq: Once | ORAL | Status: AC
  Administered 2019-12-22: 1000 mg via ORAL
  Filled 2019-12-22: qty 2

## 2019-12-22 MED ORDER — SODIUM CHLORIDE 0.9 % IV BOLUS
1000.0000 mL | Freq: Once | INTRAVENOUS | Status: AC
  Administered 2019-12-22: 1000 mL via INTRAVENOUS

## 2019-12-22 MED ORDER — PANTOPRAZOLE SODIUM 40 MG PO TBEC
40.0000 mg | DELAYED_RELEASE_TABLET | Freq: Every day | ORAL | Status: DC
  Administered 2019-12-24 – 2019-12-25 (×2): 40 mg via ORAL
  Filled 2019-12-22 (×3): qty 1

## 2019-12-22 MED ORDER — ACETAMINOPHEN 325 MG PO TABS: 650.0000 mg | ORAL_TABLET | Freq: Four times a day (QID) | ORAL | Status: DC | PRN

## 2019-12-22 MED ORDER — ROSUVASTATIN CALCIUM 10 MG PO TABS
10.0000 mg | ORAL_TABLET | Freq: Every day | ORAL | Status: DC
  Administered 2019-12-23 – 2019-12-25 (×3): 10 mg via ORAL
  Filled 2019-12-22 (×3): qty 1

## 2019-12-22 MED ORDER — ENOXAPARIN SODIUM 40 MG/0.4ML ~~LOC~~ SOLN
40.0000 mg | SUBCUTANEOUS | Status: DC
  Administered 2019-12-22 – 2019-12-25 (×4): 40 mg via SUBCUTANEOUS
  Filled 2019-12-22 (×4): qty 0.4

## 2019-12-22 MED ORDER — METOCLOPRAMIDE HCL 5 MG PO TABS
5.0000 mg | ORAL_TABLET | Freq: Three times a day (TID) | ORAL | Status: DC | PRN
  Administered 2019-12-22: 18:00:00 5 mg via ORAL
  Filled 2019-12-22: qty 1

## 2019-12-22 MED ORDER — SODIUM CHLORIDE 0.9 % IV SOLN
2.0000 g | Freq: Two times a day (BID) | INTRAVENOUS | Status: DC
  Administered 2019-12-22 – 2019-12-24 (×4): 2 g via INTRAVENOUS
  Filled 2019-12-22 (×5): qty 2

## 2019-12-22 MED ORDER — ACETAMINOPHEN 650 MG RE SUPP: 650.0000 mg | Freq: Four times a day (QID) | RECTAL | Status: DC | PRN

## 2019-12-22 MED ORDER — SUCRALFATE 1 G PO TABS
1.0000 g | ORAL_TABLET | Freq: Three times a day (TID) | ORAL | Status: DC
  Administered 2019-12-23 – 2019-12-26 (×8): 1 g via ORAL
  Filled 2019-12-22 (×11): qty 1

## 2019-12-22 MED ORDER — ONDANSETRON HCL 4 MG/2ML IJ SOLN
4.0000 mg | Freq: Once | INTRAMUSCULAR | Status: AC
  Administered 2019-12-22: 4 mg via INTRAVENOUS
  Filled 2019-12-22: qty 2

## 2019-12-22 MED ORDER — ONDANSETRON HCL 4 MG/2ML IJ SOLN
4.0000 mg | Freq: Four times a day (QID) | INTRAMUSCULAR | Status: DC | PRN
  Administered 2019-12-22 (×2): 4 mg via INTRAVENOUS
  Filled 2019-12-22 (×2): qty 2

## 2019-12-22 MED ORDER — SODIUM CHLORIDE 0.9 % IV SOLN: 2.0000 g | Freq: Once | INTRAVENOUS | Status: DC

## 2019-12-22 NOTE — ED Notes (Signed)
Transported to CT 

## 2019-12-22 NOTE — Consult Note (Signed)
Pharmacy Antibiotic Note  Dewone Sunderman is a 54 y.o. male admitted on 12/22/2019 with UTI.  Pharmacy has been consulted for Cefepime dosing. PMH significant for spinal bifida, and status post colostomy and urostomy.   Past UCrx: Providencia stuartii, Enterococcus faecalis, Klebsiella pneumoniae  Cefepime today in the ED @ ~ 1230  Plan: 1. Cefepime 2g Q12H   Height: 4\' 6"  (137.2 cm)(Per RN, patient is quite short. He is unable to stand) Weight: 135 lb (61.2 kg) IBW/kg (Calculated) : 36.2  Temp (24hrs), Avg:102.9 F (39.4 C), Min:102.9 F (39.4 C), Max:102.9 F (39.4 C)  Recent Labs  Lab 12/22/19 1213  WBC 12.5*  CREATININE 1.62*  LATICACIDVEN 1.5    Estimated Creatinine Clearance: 34.9 mL/min (A) (by C-G formula based on SCr of 1.62 mg/dL (H)).    Allergies  Allergen Reactions  . Ivp Dye [Iodinated Diagnostic Agents] Hives  . Morphine And Related Nausea And Vomiting  . Sulfa Antibiotics Other (See Comments)    Reaction: unknown  . Latex Rash    Antimicrobials this admission:   Dose adjustments this admission:   Microbiology results:   Thank you for allowing pharmacy to be a part of this patient's care.  Rowland Lathe 12/22/2019 3:54 PM

## 2019-12-22 NOTE — Progress Notes (Signed)
PHARMACY -  BRIEF ANTIBIOTIC NOTE   Pharmacy has received consult(s) for vanc and cefepime from an ED provider.  The patient's profile has been reviewed for ht/wt/allergies/indication/available labs.    One time order(s) placed for vanc 1 g + cefepime 2 g  Further antibiotics/pharmacy consults should be ordered by admitting physician if indicated.                       Thank you,  Tawnya Crook, PharmD 12/22/2019  12:15 PM

## 2019-12-22 NOTE — ED Notes (Signed)
Pt c/o nausea and gagging- reglan given (see emar)

## 2019-12-22 NOTE — ED Notes (Addendum)
Report given to inpatient RN.

## 2019-12-22 NOTE — ED Triage Notes (Signed)
Pt arrived with generalized weakness for few days.  Typically feels like this when has UTI. Has colostomy and urostomy r/t spina bifida.  Mild right mid abdominal pain.

## 2019-12-22 NOTE — Progress Notes (Signed)
CODE SEPSIS - PHARMACY COMMUNICATION  **Broad Spectrum Antibiotics should be administered within 1 hour of Sepsis diagnosis**  Time Code Sepsis Called/Page Received: 1214  Antibiotics Ordered: vanc/cefepime/metronidazole  Time of 1st antibiotic administration: 1225  Additional action taken by pharmacy: NA  If necessary, Name of Provider/Nurse Contacted: NA    Tawnya Crook ,PharmD Clinical Pharmacist  12/22/2019  12:28 PM

## 2019-12-22 NOTE — ED Notes (Signed)
Attempted to call report- extension provided for return call.

## 2019-12-22 NOTE — H&P (Signed)
History and Physical    [name] [id number] DOB: 09-Aug-1966 DOA: 12/22/2019  PCP: Idelle Crouch, MD   Patient coming from: Home  I have personally briefly reviewed patient's old medical records in [place]  Chief Complaint: Weakness  HPI: [name] is a 54 y.o. male with medical history significant for spinal bifida, status post colostomy and urostomy who presented to the emergency room for evaluation of generalized weakness that he has had for a couple of days associated with nausea and generalized fatigue.  Typically feels this way when he has a urinary tract infection.  He denies having any chest pain, cough, shortness of breath, dizziness or lightheadedness. Denies having any headache.  ED Course: Patient presents to the emergency department and was found to be febrile 102.9 F in the emergency department and was tachycardic meeting sepsis criteria.  He has a white count of 12,000 and significant pyuria. He had a CT scan of the abdomen and pelvis which shows no acute finding to explain the patient's generalized weakness on a noncontrast study. Bilateral partial staghorn calculi with decreased burden compared to prior study. There are a few small calculi in the left ureter.Chronic stable hydronephrosis of the left kidney. Changes of prior colostomy and ileostomy. Chronic left hip joint effusion. He received sepsis IV fluid in the emergency room plus IV antibiotics (Cefepime and Vancomycin) and will be admitted to the hospital for further evaluation.   Review of Systems: As per HPI otherwise 10 point review of systems negative.    Past Medical History:  Diagnosis Date  . Decubitus ulcer   . Renal disorder   . Spina bifida [facility])     Past Surgical History:  Procedure Laterality Date  . ABDOMINAL SURGERY    . BACK SURGERY    . COLON SURGERY    . ESOPHAGOGASTRODUODENOSCOPY (EGD) WITH PROPOFOL N/A 06/03/2016   Procedure: ESOPHAGOGASTRODUODENOSCOPY (EGD)  WITH PROPOFOL;  Surgeon: [name], MD;  Location: [facility] ENDOSCOPY;  Service: Endoscopy;  Laterality: N/A;  . ESOPHAGOGASTRODUODENOSCOPY (EGD) WITH PROPOFOL N/A 04/27/2019   Procedure: ESOPHAGOGASTRODUODENOSCOPY (EGD) WITH PROPOFOL;  Surgeon: Jonathon Bellows, MD;  Location: [facility] ENDOSCOPY;  Service: Gastroenterology;  Laterality: N/A;  . ilieostomy    . VENTRICULOPERITONEAL SHUNT    . vp shunt removal       reports that he has never smoked. He has never used smokeless tobacco. He reports that he does not drink alcohol or use drugs.  Allergies  Allergen Reactions  . Ivp Dye [Iodinated Diagnostic Agents] Hives  . Morphine And Related Nausea And Vomiting  . Sulfa Antibiotics Other (See Comments)    Reaction: unknown  . Latex Rash    Family History  Problem Relation Age of Onset  . Diabetes Mellitus II Father      Prior to Admission medications   Medication Sig Start Date End Date Taking? Authorizing Provider  Multiple Vitamins-Minerals (CENTRUM MEN PO) Take 1 tablet by mouth daily.     [provider]  oxyCODONE (OXY IR/ROXICODONE) 5 MG immediate release tablet Take 5 mg by mouth every 4 (four) hours as needed for pain.  04/10/19   [provider]  pantoprazole (PROTONIX) 40 MG tablet Take 1 tablet (40 mg total) by mouth daily. 04/29/19 06/28/19  Henreitta Leber, MD  protein supplement shake (PREMIER PROTEIN) LIQD Take 325 mLs (11 oz total) by mouth 2 (two) times daily between meals. 11/05/17   Epifanio Lesches, MD    Physical Exam: Vitals:   12/22/19  1400 12/22/19 1430 12/22/19 1500 12/22/19 1530  BP: 119/67 111/71 132/80 112/77  Pulse: 97 93 99 92  Resp: 15 18 16 20   Temp:   99 F (37.2 C)   TempSrc:   Axillary   SpO2: 95% 95% 96% 97%  Weight:      Height:   4\' 6"  (1.372 m)      Vitals:   12/22/19 1400 12/22/19 1430 12/22/19 1500 12/22/19 1530  BP: 119/67 111/71 132/80 112/77  Pulse: 97 93 99 92  Resp: 15 18 16 20   Temp:   99 F (37.2 C)   TempSrc:    Axillary   SpO2: 95% 95% 96% 97%  Weight:      Height:   4\' 6"  (1.372 m)     Constitutional: NAD, alert and oriented x 3 Eyes: PERRL, lids and conjunctivae normal ENMT: Mucous membranes are moist.  Neck: normal, supple, no masses, no thyromegaly Respiratory: clear to auscultation bilaterally, no wheezing, no crackles. Normal respiratory effort. No accessory muscle use.  Cardiovascular: tachycardic, no murmurs / rubs / gallops. No extremity edema. 2+ pedal pulses. No carotid bruits.  Abdomen: no tenderness, no masses palpated. No hepatosplenomegaly. Bowel sounds positive. Colostomy and Urostomy in place Musculoskeletal: no clubbing / cyanosis. Contracted lower extremities Skin: no rashes, lesions, ulcers.  Neurologic: No gross focal neurologic deficit.Lower extremity weakness Psychiatric: Normal mood and affect.   Labs on Admission: I have personally reviewed following labs and imaging studies  CBC: Recent Labs  Lab 12/22/19 1213  WBC 12.5*  NEUTROABS 10.5*  HGB 15.0  HCT 48.1  MCV 88.4  PLT 123XX123   Basic Metabolic Panel: Recent Labs  Lab 12/22/19 1213  NA 139  K 4.5  CL 102  CO2 21*  GLUCOSE 83  BUN 25*  CREATININE 1.62*  CALCIUM 9.5   GFR: Estimated Creatinine Clearance: 34.9 mL/min (A) (by C-G formula based on SCr of 1.62 mg/dL (H)). Liver Function Tests: Recent Labs  Lab 12/22/19 1213  AST 39  ALT 31  ALKPHOS 111  BILITOT 1.3*  PROT 8.6*  ALBUMIN 4.1   No results for input(s): LIPASE, AMYLASE in the last 168 hours. No results for input(s): AMMONIA in the last 168 hours. Coagulation Profile: Recent Labs  Lab 12/22/19 1213  INR 1.0   Cardiac Enzymes: No results for input(s): CKTOTAL, CKMB, CKMBINDEX, TROPONINI in the last 168 hours. BNP (last 3 results) No results for input(s): PROBNP in the last 8760 hours. HbA1C: No results for input(s): HGBA1C in the last 72 hours. CBG: No results for input(s): GLUCAP in the last 168 hours. Lipid  Profile: No results for input(s): CHOL, HDL, LDLCALC, TRIG, CHOLHDL, LDLDIRECT in the last 72 hours. Thyroid Function Tests: No results for input(s): TSH, T4TOTAL, FREET4, T3FREE, THYROIDAB in the last 72 hours. Anemia Panel: No results for input(s): VITAMINB12, FOLATE, FERRITIN, TIBC, IRON, RETICCTPCT in the last 72 hours. Urine analysis:    Component Value Date/Time   COLORURINE AMBER (A) 12/22/2019 1213   APPEARANCEUR TURBID (A) 12/22/2019 1213   APPEARANCEUR Cloudy (A) 03/21/2019 1130   LABSPEC 1.010 12/22/2019 1213   PHURINE 7.0 12/22/2019 1213   GLUCOSEU NEGATIVE 12/22/2019 1213   HGBUR MODERATE (A) 12/22/2019 1213   BILIRUBINUR NEGATIVE 12/22/2019 1213   BILIRUBINUR Negative 03/21/2019 1130   KETONESUR 20 (A) 12/22/2019 1213   PROTEINUR 100 (A) 12/22/2019 1213   NITRITE POSITIVE (A) 12/22/2019 1213   LEUKOCYTESUR LARGE (A) 12/22/2019 1213    Radiological Exams on Admission: CT  ABDOMEN PELVIS WO CONTRAST  Result Date: 12/22/2019 CLINICAL DATA:  Pt arrived with generalized weakness for few days. Typically feels like this when has UTI. Has colostomy and urostomy r/t spina bifida. Mild right mid abdominal pain. EXAM: CT ABDOMEN AND PELVIS WITHOUT CONTRAST TECHNIQUE: Multidetector CT imaging of the abdomen and pelvis was performed following the standard protocol without IV contrast. COMPARISON:  CT abdomen pelvis 04/26/2019 FINDINGS: Lower chest: No acute abnormality. Evaluation of the abdominal viscera somewhat limited by the lack of IV contrast. Hepatobiliary: No focal liver abnormality is seen. No gallstones, gallbladder wall thickening, or biliary dilatation. Pancreas: Unremarkable. Spleen: Normal in size without focal abnormality. Adrenals/Urinary Tract: Adrenal glands are normal. Bilateral partial staghorn calculi with decreased burden compared to prior. There is chronic stable hydronephrosis of the left kidney. There are a few small calculi in the left ureter. No right  hydronephrosis. Ileostomy is noted in the right lower quadrant. Urinary bladder is unremarkable. Stomach/Bowel: Stomach is unremarkable. Colostomy is noted in the left mid abdomen. No obstructive or inflammatory changes in the bowel. Vascular/Lymphatic: Vascular patency cannot be assessed in the absence of IV contrast. No new lymphadenopathy. Reproductive: Prostate is unremarkable. Other: No abdominal wall hernia. No abdominopelvic ascites. Musculoskeletal: Chronic degenerative changes in the bilateral hip joints. No acute bony abnormality. Redemonstrated left hip joint effusion. Diffuse demineralization. Bony changes in the thoracolumbar spine compatible with known spina bifida. IMPRESSION: 1. No acute finding to explain the patient's generalized weakness on a noncontrast study. 2. Bilateral partial staghorn calculi with decreased burden compared to prior study. There are a few small calculi in the left ureter. Chronic stable hydronephrosis of the left kidney. 3. Changes of prior colostomy and ileostomy. 4. Chronic left hip joint effusion. Electronically Signed   By: Audie Pinto M.D.   On: 12/22/2019 13:30   DG Chest Port 1 View  Result Date: 12/22/2019 CLINICAL DATA:  Fever, weakness EXAM: PORTABLE CHEST 1 VIEW COMPARISON:  04/20/2016 FINDINGS: Normal heart size. No focal airspace consolidation, pleural effusion, or pneumothorax. Scoliotic curvature with chronic left upper rib deformities. IMPRESSION: No acute cardiopulmonary findings. Electronically Signed   By: Davina Poke D.O.   On: 12/22/2019 12:47    EKG: Independently reviewed.  Sinus Tachycardia  Assessment/Plan Principal Problem:   Sepsis ([place]) Active Problems:   Acute lower UTI   GERD (gastroesophageal reflux disease)     Sepsis from a urinary source Present on admission as evidenced by fever with a T max of 102F, he was tachycardic with heart rate in the 120's. He has leukocytosis with a left shift. Lactate of 1.7 Patient  has a urostomy and history of staghorn calculi Will continue IVF hydration Start patient on Cefepime adjusted to renal function for gram negative coverage  AKI Secondary to sepsis Patient has stable hydronephrosis Continue IVF hydration Repeat renal parameters in am   GERD Continue Carafate and PPI  DVT prophylaxis: Lovenox Code Status: Full Family Communication: Plan of care was discussed with patient at the bedside. He verbalizes understanding and agrees with the plan Disposition Plan: Back to previous home environment Consults called: None    Jaqwon Manfred MD [facility]     12/22/2019, 4:50 PM

## 2019-12-22 NOTE — ED Provider Notes (Signed)
[facility] Emergency Department Provider Note  Time seen: 12:03 PM  I have reviewed the triage vital signs and the nursing notes.   HISTORY  Chief Complaint Weakness   HPI [name] is a 54 y.o. male with a past medical history of spina bifida, colostomy, urostomy, presents to the emergency department for generalized fatigue weakness and nausea.  According the EMS for the past several days patient has been feeling very weak and nauseated, denies vomiting with feels very nauseous per patient.  Does state mild abdominal discomfort 3/10, dull pain diffusely.  Denies any significant cough denies any shortness of breath or chest pain.  Past Medical History:  Diagnosis Date  . Decubitus ulcer   . Renal disorder   . Spina bifida [facility])     Patient Active Problem List   Diagnosis Date Noted  . SBO (small bowel obstruction) ([place]) 11/02/2017  . Abdominal distention 10/11/2016  . Distal intestinal obstruction syndrome ([place]) 10/11/2016  . Hypokalemia 10/11/2016  . Leukocytosis 10/11/2016  . Loose stools 10/11/2016  . Fecal impaction in rectum ([place]) 10/10/2016  . Nausea & vomiting 06/03/2016  . Gastric outlet obstruction 06/03/2016  . SIRS (systemic inflammatory response syndrome) ([place]) 06/03/2016  . Pressure ulcer 06/03/2016  . Impaction of the bowels ([place])   . Gastritis   . Ulcer of esophagus with bleeding   . Nausea with vomiting   . Nausea vomiting and diarrhea   . Sepsis ([name]) 04/21/2016    Past Surgical History:  Procedure Laterality Date  . ABDOMINAL SURGERY    . BACK SURGERY    . COLON SURGERY    . ESOPHAGOGASTRODUODENOSCOPY (EGD) WITH PROPOFOL N/A 06/03/2016   Procedure: ESOPHAGOGASTRODUODENOSCOPY (EGD) WITH PROPOFOL;  Surgeon: [name], MD;  Location: [facility] ENDOSCOPY;  Service: Endoscopy;  Laterality: N/A;  . ESOPHAGOGASTRODUODENOSCOPY (EGD) WITH PROPOFOL N/A 04/27/2019   Procedure: ESOPHAGOGASTRODUODENOSCOPY (EGD) WITH PROPOFOL;  Surgeon:  Jonathon Bellows, MD;  Location[facility] ENDOSCOPY;  Service: Gastroenterology;  Laterality: N/A;  . ilieostomy    . VENTRICULOPERITONEAL SHUNT    . vp shunt removal      Prior to Admission medications   Medication Sig Start Date End Date Taking? Authorizing Provider  Multiple Vitamins-Minerals (CENTRUM MEN PO) Take 1 tablet by mouth daily.     [provider]  oxyCODONE (OXY IR/ROXICODONE) 5 MG immediate release tablet Take 5 mg by mouth every 4 (four) hours as needed for pain.  04/10/19   [provider]  pantoprazole (PROTONIX) 40 MG tablet Take 1 tablet (40 mg total) by mouth daily. 04/29/19 06/28/19  Henreitta Leber, MD  protein supplement shake (PREMIER PROTEIN) LIQD Take 325 mLs (11 oz total) by mouth 2 (two) times daily between meals. 11/05/17   Epifanio Lesches, MD    Allergies  Allergen Reactions  . Ivp Dye [Iodinated Diagnostic Agents] Hives  . Morphine And Related Nausea And Vomiting  . Sulfa Antibiotics Other (See Comments)    Reaction: unknown  . Latex Rash    Family History  Problem Relation Age of Onset  . Diabetes Mellitus II Father     Social History Social History   Tobacco Use  . Smoking status: Never Smoker  . Smokeless tobacco: Never Used  Substance Use Topics  . Alcohol use: No  . Drug use: No    Review of Systems Constitutional: Subjective fever at home per patient. Cardiovascular: Negative for chest pain. Respiratory: Negative for shortness of breath.  Negative for cough. Gastrointestinal: Mild diffuse abdominal  pain.  Positive for nausea.  Negative for vomiting.  Negative for ostomy output change. Genitourinary: Urostomy Musculoskeletal: Negative for musculoskeletal complaints Neurological: Negative for headache All other ROS negative  ____________________________________________   PHYSICAL EXAM:  VITAL SIGNS: ED Triage Vitals  Enc Vitals Group     BP --      Pulse --      Resp --      Temp --      Temp src --      SpO2  --      Weight 12/22/19 1201 135 lb (61.2 kg)     Height --      Head Circumference --      Peak Flow --      Pain Score 12/22/19 1200 3     Pain Loc --      Pain Edu? --      Excl. in [place]? --    Constitutional: Alert and oriented. Well appearing and in no distress. Eyes: Strabismus otherwise normal exam ENT      Head: Normocephalic and atraumatic.      Mouth/Throat: Somewhat dry appearing mucous membranes Cardiovascular: Regular rhythm rate around 120 bpm. Respiratory: Normal respiratory effort without tachypnea nor retractions. Breath sounds are clear.  No obvious wheeze rales or rhonchi. Gastrointestinal: Soft, slight tenderness somewhat diffusely.  Denies any significant tenderness.  No rebound guarding or distention.  Colostomy and urostomy present with output. Musculoskeletal: History of spina bifida, shortened lower extremities, no acute issue. Neurologic:  Normal speech and language. No gross focal neurologic deficits Skin:  Skin is warm, dry Psychiatric: Mood and affect are normal.   ____________________________________________    EKG  EKG viewed and interpreted by myself shows sinus tachycardia 124 bpm with a narrow QRS, normal axis, normal intervals, nonspecific ST changes.  No ST elevation.  ____________________________________________    RADIOLOGY  CT imaging shows no acute findings.  Chest x-ray is negative.  ____________________________________________   INITIAL IMPRESSION / ASSESSMENT AND PLAN / ED COURSE  Pertinent labs & imaging results that were available during my care of the patient were reviewed by me and considered in my medical decision making (see chart for details).   Patient presents to the emergency department found to be febrile 102.9 in the emergency department, tachycardic meeting sepsis criteria.  We will check labs, cultures, chest x-ray given the patient's complaint of somewhat diffuse abdominal pain along with multiple abdominal surgeries  we will obtain CT imaging of the abdomen.  We will start the patient on broad-spectrum antibiotics, IV fluids and continue to closely monitor while awaiting results.  Patient agreeable to plan of care.  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  White blood cell count slightly elevated consistent with sepsis.  Heart rate is decreasing with Tylenol and fluids.  Patient will be admitted to the hospitalist service for further treatment and work-up.  Corona negative.  Abrian Saiki was evaluated in Emergency Department on 12/22/2019 for the symptoms described in the history of present illness. He was evaluated in the context of the global COVID-19 pandemic, which necessitated consideration that the patient might be at risk for infection with the SARS-CoV-2 virus that causes COVID-19. Institutional protocols and algorithms that pertain to the evaluation of patients at risk for COVID-19 are in a state of rapid change based on information released by regulatory bodies including the CDC and federal and state organizations. These policies and algorithms were followed during the patient's  care in the ED.  CRITICAL CARE Performed by: Harvest Dark   Total critical care time: 30 minutes  Critical care time was exclusive of separately billable procedures and treating other patients.  Critical care was necessary to treat or prevent imminent or life-threatening deterioration.  Critical care was time spent personally by me on the following activities: development of treatment plan with patient and/or surrogate as well as nursing, discussions with consultants, evaluation of patient's response to treatment, examination of patient, obtaining history from patient or surrogate, ordering and performing treatments and interventions, ordering and review of laboratory studies, ordering and review of radiographic studies, pulse oximetry and re-evaluation of patient's  condition.  ____________________________________________   FINAL CLINICAL IMPRESSION(S) / ED DIAGNOSES  Sepsis Urinary tract infection   Harvest Dark, MD 12/22/19 1411

## 2019-12-23 DIAGNOSIS — R066 Hiccough: Secondary | ICD-10-CM

## 2019-12-23 DIAGNOSIS — R6520 Severe sepsis without septic shock: Secondary | ICD-10-CM

## 2019-12-23 DIAGNOSIS — R112 Nausea with vomiting, unspecified: Secondary | ICD-10-CM

## 2019-12-23 DIAGNOSIS — N179 Acute kidney failure, unspecified: Secondary | ICD-10-CM

## 2019-12-23 LAB — BASIC METABOLIC PANEL
Anion gap: 17 — ABNORMAL HIGH (ref 5–15)
BUN: 22 mg/dL — ABNORMAL HIGH (ref 6–20)
CO2: 15 mmol/L — ABNORMAL LOW (ref 22–32)
Calcium: 8.6 mg/dL — ABNORMAL LOW (ref 8.9–10.3)
Chloride: 117 mmol/L — ABNORMAL HIGH (ref 98–111)
Creatinine, Ser: 1.36 mg/dL — ABNORMAL HIGH (ref 0.61–1.24)
GFR calc Af Amer: >60 mL/min (ref >60)
GFR calc non Af Amer: 59 mL/min — ABNORMAL LOW (ref >60)
Glucose, Bld: 62 mg/dL — ABNORMAL LOW (ref 70–99)
Potassium: 4 mmol/L (ref 3.5–5.1)
Sodium: 149 mmol/L — ABNORMAL HIGH (ref 135–145)

## 2019-12-23 LAB — CBC
HCT: 44.3 % (ref 39.0–52.0)
Hemoglobin: 12.9 g/dL — ABNORMAL LOW (ref 13.0–17.0)
MCH: 27.3 pg (ref 26.0–34.0)
MCHC: 29.1 g/dL — ABNORMAL LOW (ref 30.0–36.0)
MCV: 93.9 fL (ref 80.0–100.0)
Platelets: 249 10*3/uL (ref 150–400)
RBC: 4.72 MIL/uL (ref 4.22–5.81)
RDW: 14.8 % (ref 11.5–15.5)
WBC: 8.6 10*3/uL (ref 4.0–10.5)
nRBC: 0 % (ref 0.0–0.2)

## 2019-12-23 LAB — C DIFFICILE QUICK SCREEN W PCR REFLEX
C Diff antigen: NEGATIVE
C Diff interpretation: NOT DETECTED
C Diff toxin: NEGATIVE

## 2019-12-23 LAB — CORTISOL-AM, BLOOD: Cortisol - AM: 33.2 ug/dL — ABNORMAL HIGH (ref 6.7–22.6)

## 2019-12-23 LAB — PROCALCITONIN: Procalcitonin: 6.03 ng/mL

## 2019-12-23 LAB — PROTIME-INR
INR: 1.1 (ref 0.8–1.2)
Prothrombin Time: 13.8 seconds (ref 11.4–15.2)

## 2019-12-23 MED ORDER — PROMETHAZINE HCL 25 MG/ML IJ SOLN
12.5000 mg | Freq: Four times a day (QID) | INTRAMUSCULAR | Status: DC | PRN
  Administered 2019-12-25: 09:00:00 12.5 mg via INTRAVENOUS
  Filled 2019-12-23: qty 1

## 2019-12-23 MED ORDER — SODIUM CHLORIDE 0.9 % IV SOLN
12.5000 mg | Freq: Once | INTRAVENOUS | Status: AC
  Administered 2019-12-23: 12.5 mg via INTRAVENOUS
  Filled 2019-12-23: qty 0.5

## 2019-12-23 MED ORDER — PROMETHAZINE HCL 25 MG/ML IJ SOLN
12.5000 mg | Freq: Once | INTRAMUSCULAR | Status: AC
  Administered 2019-12-23: 12.5 mg via INTRAVENOUS
  Filled 2019-12-23: qty 1

## 2019-12-23 MED ORDER — METOCLOPRAMIDE HCL 5 MG/ML IJ SOLN
5.0000 mg | Freq: Four times a day (QID) | INTRAMUSCULAR | Status: DC
  Administered 2019-12-23 – 2019-12-26 (×12): 5 mg via INTRAVENOUS
  Filled 2019-12-23 (×12): qty 2

## 2019-12-23 MED ORDER — SODIUM CHLORIDE 0.9 % IV SOLN
12.5000 mg | Freq: Three times a day (TID) | INTRAVENOUS | Status: DC | PRN
  Administered 2019-12-25: 12.5 mg via INTRAVENOUS
  Filled 2019-12-23 (×2): qty 0.5

## 2019-12-23 MED ORDER — ONDANSETRON HCL 4 MG/2ML IJ SOLN
4.0000 mg | Freq: Four times a day (QID) | INTRAMUSCULAR | Status: DC
  Administered 2019-12-23 – 2019-12-26 (×12): 4 mg via INTRAVENOUS
  Filled 2019-12-23 (×12): qty 2

## 2019-12-23 NOTE — Progress Notes (Signed)
Patient ID: [name], male   DOB: September 27, 1966, 53 y.o.   MRN: [id number] [facility] PROGRESS NOTE  Samay Burritt [id number] DOB: 02-25-1966 DOA: 12/22/2019 PCP: Idelle Crouch, MD  HPI/Subjective: Patient is a man of few words.  States he mouth is so dry and can hardly eat because of nausea.  He had vomiting last night.  He has hiccups.  Has a urostomy bag.  Has a colostomy bag.  Objective: Vitals:   12/23/19 0548 12/23/19 0853  BP: (!) 144/75 140/79  Pulse: (!) 109 (!) 101  Resp: 20 18  Temp: 98.2 F (36.8 C) 99.9 F (37.7 C)  SpO2: 96% 97%    Intake/Output Summary (Last 24 hours) at 12/23/2019 1448 Last data filed at 12/23/2019 1421 Gross per 24 hour  Intake 1594.76 ml  Output 1625 ml  Net -30.24 ml   Filed Weights   12/22/19 1201  Weight: 61.2 kg    ROS: Review of Systems  Constitutional: Negative for fever.  Eyes: Negative for blurred vision.  Respiratory: Negative for shortness of breath.   Cardiovascular: Negative for chest pain.  Gastrointestinal: Positive for diarrhea, nausea and vomiting. Negative for abdominal pain and constipation.  Genitourinary: Negative for dysuria.  Musculoskeletal: Negative for joint pain.  Neurological: Negative for dizziness and headaches.   Exam: Physical Exam  HENT:  Nose: No mucosal edema.  Mouth/Throat: No oropharyngeal exudate or posterior oropharyngeal edema.  Eyes: Conjunctivae and lids are normal.  Cardiovascular: S1 normal and S2 normal. Tachycardia present. Exam reveals no gallop.  No murmur heard. Pulses:      Dorsalis pedis pulses are 2+ on the right side and 2+ on the left side.  Respiratory: No respiratory distress. He has decreased breath sounds in the right lower field and the left lower field. He has no wheezes. He has no rhonchi. He has no rales.  GI: Soft. Bowel sounds are normal. There is no abdominal tenderness.  Musculoskeletal:     Right ankle: No swelling.     Left ankle: No  swelling.  Lymphadenopathy:    He has no cervical adenopathy.  Neurological: He is alert.  Lower extremity paralysis  Skin: Skin is warm. Nails show no clubbing.  Psychiatric: He has a normal mood and affect.      Data Reviewed: Basic Metabolic Panel: Recent Labs  Lab 12/22/19 1213 12/23/19 0727  NA 139 149*  K 4.5 4.0  CL 102 117*  CO2 21* 15*  GLUCOSE 83 62*  BUN 25* 22*  CREATININE 1.62* 1.36*  CALCIUM 9.5 8.6*   Liver Function Tests: Recent Labs  Lab 12/22/19 1213  AST 39  ALT 31  ALKPHOS 111  BILITOT 1.3*  PROT 8.6*  ALBUMIN 4.1   CBC: Recent Labs  Lab 12/22/19 1213 12/23/19 0727  WBC 12.5* 8.6  NEUTROABS 10.5*  --   HGB 15.0 12.9*  HCT 48.1 44.3  MCV 88.4 93.9  PLT 288 249     Recent Results (from the past 240 hour(s))  Blood Culture (routine x 2)     Status: None (Preliminary result)   Collection Time: 12/22/19 12:13 PM   Specimen: BLOOD  Result Value Ref Range Status   Specimen Description BLOOD R ARM  Final   Special Requests   Final    BOTTLES DRAWN AEROBIC AND ANAEROBIC Blood Culture results may not be optimal due to an inadequate volume of blood received in culture bottles   Culture   Final    NO GROWTH <  24 HOURS Performed at [facility], [address] [place], [place] [address]    Report Status PENDING  Incomplete  Urine culture     Status: Abnormal (Preliminary result)   Collection Time: 12/22/19 12:13 PM   Specimen: In/Out Cath Urine  Result Value Ref Range Status   Specimen Description IN/OUT CATH URINE  Final   Special Requests   Final    NONE Performed at [facility], [address] [place], [place] [address]    Culture >=100,000 COLONIES/mL PROVIDENCIA STUARTII (A)  Final   Report Status PENDING  Incomplete  Respiratory Panel by RT PCR (Flu A&B, Covid) -     Status: None   Collection Time: 12/22/19 12:13 PM  Result Value Ref Range Status   SARS Coronavirus 2 by RT PCR NEGATIVE NEGATIVE Final     Comment: (NOTE) SARS-CoV-2 target nucleic acids are NOT DETECTED. The SARS-CoV-2 RNA is generally detectable in upper respiratoy specimens during the acute phase of infection. The lowest concentration of SARS-CoV-2 viral copies this assay can detect is 131 copies/mL. A negative result does not preclude SARS-Cov-2 infection and should not be used as the sole basis for treatment or other patient management decisions. A negative result may occur with  improper specimen collection/handling, submission of specimen other than nasopharyngeal swab, presence of viral mutation(s) within the areas targeted by this assay, and inadequate number of viral copies (<131 copies/mL). A negative result must be combined with clinical observations, patient history, and epidemiological information. The expected result is Negative. Fact Sheet for Patients:  PinkCheek.be Fact Sheet for Healthcare Providers:  GravelBags.it This test is not yet ap proved or cleared by the [place] FDA and  has been authorized for detection and/or diagnosis of SARS-CoV-2 by FDA under an Emergency Use Authorization (EUA). This EUA will remain  in effect (meaning this test can be used) for the duration of the COVID-19 declaration under Section 564(b)(1) of the Act, 21 U.S.C. section 360bbb-3(b)(1), unless the authorization is terminated or revoked sooner.    Influenza A by PCR NEGATIVE NEGATIVE Final   Influenza B by PCR NEGATIVE NEGATIVE Final    Comment: (NOTE) The Xpert Xpress SARS-CoV-2/FLU/RSV assay is intended as an aid in  the diagnosis of influenza from Nasopharyngeal swab specimens and  should not be used as a sole basis for treatment. Nasal washings and  aspirates are unacceptable for Xpert Xpress SARS-CoV-2/FLU/RSV  testing. Fact Sheet for Patients: PinkCheek.be Fact Sheet for Healthcare  Providers: GravelBags.it This test is not yet approved or cleared by the [place] FDA and  has been authorized for detection and/or diagnosis of SARS-CoV-2 by  FDA under an Emergency Use Authorization (EUA). This EUA will remain  in effect (meaning this test can be used) for the duration of the  Covid-19 declaration under Section 564(b)(1) of the Act, 21  U.S.C. section 360bbb-3(b)(1), unless the authorization is  terminated or revoked. Performed at [facility], [address] [place], [place] [address]   Blood Culture (routine x 2)     Status: None (Preliminary result)   Collection Time: 12/22/19 12:14 PM   Specimen: BLOOD  Result Value Ref Range Status   Specimen Description BLOOD RAC  Final   Special Requests   Final    BOTTLES DRAWN AEROBIC AND ANAEROBIC Blood Culture adequate volume   Culture   Final    NO GROWTH < 24 HOURS Performed at [facility] [address], [place], [place] [address]  Report Status PENDING  Incomplete  C difficile quick scan w PCR reflex     Status: None   Collection Time: 12/23/19  1:49 PM   Specimen: STOOL  Result Value Ref Range Status   C Diff antigen NEGATIVE NEGATIVE Final   C Diff toxin NEGATIVE NEGATIVE Final   C Diff interpretation No C. difficile detected.  Final    Comment: Performed at [facility], [address] [place], [place] [address]     Studies: CT ABDOMEN PELVIS WO CONTRAST  Result Date: 12/22/2019 CLINICAL DATA:  Pt arrived with generalized weakness for few days. Typically feels like this when has UTI. Has colostomy and urostomy r/t spina bifida. Mild right mid abdominal pain. EXAM: CT ABDOMEN AND PELVIS WITHOUT CONTRAST TECHNIQUE: Multidetector CT imaging of the abdomen and pelvis was performed following the standard protocol without IV contrast. COMPARISON:  CT abdomen pelvis 04/26/2019 FINDINGS: Lower chest: No acute abnormality. Evaluation of the abdominal  viscera somewhat limited by the lack of IV contrast. Hepatobiliary: No focal liver abnormality is seen. No gallstones, gallbladder wall thickening, or biliary dilatation. Pancreas: Unremarkable. Spleen: Normal in size without focal abnormality. Adrenals/Urinary Tract: Adrenal glands are normal. Bilateral partial staghorn calculi with decreased burden compared to prior. There is chronic stable hydronephrosis of the left kidney. There are a few small calculi in the left ureter. No right hydronephrosis. Ileostomy is noted in the right lower quadrant. Urinary bladder is unremarkable. Stomach/Bowel: Stomach is unremarkable. Colostomy is noted in the left mid abdomen. No obstructive or inflammatory changes in the bowel. Vascular/Lymphatic: Vascular patency cannot be assessed in the absence of IV contrast. No new lymphadenopathy. Reproductive: Prostate is unremarkable. Other: No abdominal wall hernia. No abdominopelvic ascites. Musculoskeletal: Chronic degenerative changes in the bilateral hip joints. No acute bony abnormality. Redemonstrated left hip joint effusion. Diffuse demineralization. Bony changes in the thoracolumbar spine compatible with known spina bifida. IMPRESSION: 1. No acute finding to explain the patient's generalized weakness on a noncontrast study. 2. Bilateral partial staghorn calculi with decreased burden compared to prior study. There are a few small calculi in the left ureter. Chronic stable hydronephrosis of the left kidney. 3. Changes of prior colostomy and ileostomy. 4. Chronic left hip joint effusion. Electronically Signed   By: Audie Pinto M.D.   On: 12/22/2019 13:30   DG Chest Port 1 View  Result Date: 12/22/2019 CLINICAL DATA:  Fever, weakness EXAM: PORTABLE CHEST 1 VIEW COMPARISON:  04/20/2016 FINDINGS: Normal heart size. No focal airspace consolidation, pleural effusion, or pneumothorax. Scoliotic curvature with chronic left upper rib deformities. IMPRESSION: No acute  cardiopulmonary findings. Electronically Signed   By: Davina Poke D.O.   On: 12/22/2019 12:47    Scheduled Meds: . enoxaparin (LOVENOX) injection  40 mg Subcutaneous Q24H  . metoCLOPramide (REGLAN) injection  5 mg Intravenous Q6H  . multivitamin with minerals  1 tablet Oral Daily  . ondansetron (ZOFRAN) IV  4 mg Intravenous Q6H  . pantoprazole  40 mg Oral Daily  . rosuvastatin  10 mg Oral QHS  . sucralfate  1 g Oral TID   Continuous Infusions: . sodium chloride 50 mL/hr at 12/23/19 0830  . ceFEPime (MAXIPIME) IV 2 g (12/23/19 1100)    Assessment/Plan:  1. Sepsis, present on admission with acute kidney injury.  Urinary source.  Patient has a urostomy.  Providencia stuartii growing on the culture.  Awaiting sensitivities.  Patient on empiric Maxipime.  White blood cell count trending better.  IV fluid hydration.  Creatinine coming down from 1.62 down to 1.36. 2. Severe nausea with vomiting last night.  Change Reglan to IV.  Standing dose Zofran.  As needed Phenergan for breakthrough nausea. 3. Hiccups.  Give a dose of Thorazine.  As needed Thorazine after that. 4. GERD on Carafate and Protonix 5. Hyperlipidemia on Crestor  Code Status:     Code Status Orders  (From admission, onward)         Start     Ordered   12/22/19 1448  Full code  Continuous     12/22/19 1449        Code Status History    Date Active Date Inactive Code Status Order ID Comments User Context   04/24/2019 0036 04/29/2019 1658 Full Code [id number]  Christel Mormon, MD ED   11/02/2017 1730 11/05/2017 2040 Full Code [id number]  Demetrios Loll, MD Inpatient   10/10/2016 0205 10/11/2016 2124 Full Code [id number]  Harvie Bridge, DO Inpatient   06/03/2016 0429 06/08/2016 1914 Full Code [id number]  Saundra Shelling, MD Inpatient   04/21/2016 0238 04/26/2016 1815 Full Code [id number]  Harrie Foreman, MD Inpatient   Advance Care Planning Activity    Advance Directive Documentation     Most Recent Value  Type of Advance  Directive  Healthcare Power of Attorney  Pre-existing out of facility DNR order (yellow form or pink MOST form)  --  "MOST" Form in Place?  --     Family Communication: Spoke with sister on the phone Disposition Plan: Likely will need a few days of IV antibiotics prior to disposition.  Antibiotics:  Maxipime  Time spent: 28 minutes  [place]

## 2019-12-24 ENCOUNTER — Inpatient Hospital Stay: Payer: Self-pay

## 2019-12-24 DIAGNOSIS — E785 Hyperlipidemia, unspecified: Secondary | ICD-10-CM

## 2019-12-24 LAB — URINE CULTURE: Culture: >=100000 — AB

## 2019-12-24 LAB — CBC
HCT: 40.6 % (ref 39.0–52.0)
Hemoglobin: 12.6 g/dL — ABNORMAL LOW (ref 13.0–17.0)
MCH: 27.2 pg (ref 26.0–34.0)
MCHC: 31 g/dL (ref 30.0–36.0)
MCV: 87.7 fL (ref 80.0–100.0)
Platelets: 249 10*3/uL (ref 150–400)
RBC: 4.63 MIL/uL (ref 4.22–5.81)
RDW: 14.9 % (ref 11.5–15.5)
WBC: 7.2 10*3/uL (ref 4.0–10.5)
nRBC: 0 % (ref 0.0–0.2)

## 2019-12-24 LAB — BASIC METABOLIC PANEL
Anion gap: 9 (ref 5–15)
BUN: 16 mg/dL (ref 6–20)
CO2: 17 mmol/L — ABNORMAL LOW (ref 22–32)
Calcium: 9 mg/dL (ref 8.9–10.3)
Chloride: 117 mmol/L — ABNORMAL HIGH (ref 98–111)
Creatinine, Ser: 1.22 mg/dL (ref 0.61–1.24)
GFR calc Af Amer: >60 mL/min (ref >60)
GFR calc non Af Amer: >60 mL/min (ref >60)
Glucose, Bld: 99 mg/dL (ref 70–99)
Potassium: 3.4 mmol/L — ABNORMAL LOW (ref 3.5–5.1)
Sodium: 143 mmol/L (ref 135–145)

## 2019-12-24 MED ORDER — SODIUM CHLORIDE 0.9 % IV SOLN
2.0000 g | INTRAVENOUS | Status: DC
  Administered 2019-12-24 – 2019-12-25 (×2): 2 g via INTRAVENOUS
  Filled 2019-12-24 (×2): qty 20
  Filled 2019-12-24: qty 2

## 2019-12-24 NOTE — Progress Notes (Signed)
Patient ID: [name], male   DOB: 1966-07-09, 54 y.o.   MRN: [id number] [facility] PROGRESS NOTE  [name] [id number] DOB: 1966-05-06 DOA: 12/22/2019 PCP: Idelle Crouch, MD  HPI/Subjective: Patient seen this morning while eating.  He states that he is able to eat at this point.  He states he always has nausea.  No further vomiting.  Having diarrhea.  Objective: Vitals:   12/24/19 0411 12/24/19 1152  BP: (!) 160/74 (!) 152/95  Pulse: 82 89  Resp: 20   Temp: 98.9 F (37.2 C) 98 F (36.7 C)  SpO2: 96% 97%    Filed Weights   12/22/19 1201  Weight: 61.2 kg    ROS: Review of Systems  Constitutional: Negative for fever.  Eyes: Negative for blurred vision.  Respiratory: Negative for shortness of breath.   Cardiovascular: Negative for chest pain.  Gastrointestinal: Positive for diarrhea. Negative for abdominal pain, constipation, nausea and vomiting.  Genitourinary: Negative for dysuria.  Musculoskeletal: Negative for joint pain.  Neurological: Negative for dizziness and headaches.   Exam: Physical Exam  HENT:  Nose: No mucosal edema.  Mouth/Throat: No oropharyngeal exudate or posterior oropharyngeal edema.  Eyes: Conjunctivae and lids are normal.  Cardiovascular: Regular rhythm, S1 normal and S2 normal. Exam reveals no gallop.  No murmur heard. Respiratory: No respiratory distress. He has decreased breath sounds in the right lower field and the left lower field. He has no wheezes. He has no rhonchi. He has no rales.  GI: Soft. Bowel sounds are normal. There is no abdominal tenderness.  Musculoskeletal:     Right ankle: No swelling.     Left ankle: No swelling.  Lymphadenopathy:    He has no cervical adenopathy.  Neurological: He is alert.  Lower extremity paralysis  Skin: Skin is warm. Nails show no clubbing.  Psychiatric: He has a normal mood and affect.      Data Reviewed: Basic Metabolic Panel: Recent Labs  Lab 12/22/19 1213  12/23/19 0727 12/24/19 0646  NA 139 149* 143  K 4.5 4.0 3.4*  CL 102 117* 117*  CO2 21* 15* 17*  GLUCOSE 83 62* 99  BUN 25* 22* 16  CREATININE 1.62* 1.36* 1.22  CALCIUM 9.5 8.6* 9.0   Liver Function Tests: Recent Labs  Lab 12/22/19 1213  AST 39  ALT 31  ALKPHOS 111  BILITOT 1.3*  PROT 8.6*  ALBUMIN 4.1   CBC: Recent Labs  Lab 12/22/19 1213 12/23/19 0727 12/24/19 0646  WBC 12.5* 8.6 7.2  NEUTROABS 10.5*  --   --   HGB 15.0 12.9* 12.6*  HCT 48.1 44.3 40.6  MCV 88.4 93.9 87.7  PLT 288 249 249     Recent Results (from the past 240 hour(s))  Blood Culture (routine x 2)     Status: None (Preliminary result)   Collection Time: 12/22/19 12:13 PM   Specimen: BLOOD  Result Value Ref Range Status   Specimen Description BLOOD R ARM  Final   Special Requests   Final    BOTTLES DRAWN AEROBIC AND ANAEROBIC Blood Culture results may not be optimal due to an inadequate volume of blood received in culture bottles   Culture   Final    NO GROWTH 2 DAYS Performed at [facility], [address], [place], [place] [address]    Report Status PENDING  Incomplete  Urine culture     Status: Abnormal   Collection Time: 12/22/19 12:13 PM   Specimen: In/Out Cath Urine  Result  Value Ref Range Status   Specimen Description IN/OUT CATH URINE  Final   Special Requests   Final    NONE Performed at [facility], [address] [place], [place] [address]    Culture >=100,000 COLONIES/mL PROVIDENCIA STUARTII (A)  Final   Report Status 12/24/2019 FINAL  Final   Organism ID, Bacteria PROVIDENCIA STUARTII (A)  Final      Susceptibility   Providencia stuartii - MIC*    AMPICILLIN >=32 RESISTANT Resistant     CEFAZOLIN >=64 RESISTANT Resistant     CEFTRIAXONE <=0.25 SENSITIVE Sensitive     CIPROFLOXACIN >=4 RESISTANT Resistant     GENTAMICIN RESISTANT Resistant     IMIPENEM 8 INTERMEDIATE Intermediate     NITROFURANTOIN 256 RESISTANT Resistant     TRIMETH/SULFA >=320  RESISTANT Resistant     AMPICILLIN/SULBACTAM >=32 RESISTANT Resistant     PIP/TAZO 8 SENSITIVE Sensitive     * >=100,000 COLONIES/mL PROVIDENCIA STUARTII  Respiratory Panel by RT PCR (Flu A&B, Covid) -     Status: None   Collection Time: 12/22/19 12:13 PM  Result Value Ref Range Status   SARS Coronavirus 2 by RT PCR NEGATIVE NEGATIVE Final    Comment: (NOTE) SARS-CoV-2 target nucleic acids are NOT DETECTED. The SARS-CoV-2 RNA is generally detectable in upper respiratoy specimens during the acute phase of infection. The lowest concentration of SARS-CoV-2 viral copies this assay can detect is 131 copies/mL. A negative result does not preclude SARS-Cov-2 infection and should not be used as the sole basis for treatment or other patient management decisions. A negative result may occur with  improper specimen collection/handling, submission of specimen other than nasopharyngeal swab, presence of viral mutation(s) within the areas targeted by this assay, and inadequate number of viral copies (<131 copies/mL). A negative result must be combined with clinical observations, patient history, and epidemiological information. The expected result is Negative. Fact Sheet for Patients:  PinkCheek.be Fact Sheet for Healthcare Providers:  GravelBags.it This test is not yet ap proved or cleared by the [place] FDA and  has been authorized for detection and/or diagnosis of SARS-CoV-2 by FDA under an Emergency Use Authorization (EUA). This EUA will remain  in effect (meaning this test can be used) for the duration of the COVID-19 declaration under Section 564(b)(1) of the Act, 21 U.S.C. section 360bbb-3(b)(1), unless the authorization is terminated or revoked sooner.    Influenza A by PCR NEGATIVE NEGATIVE Final   Influenza B by PCR NEGATIVE NEGATIVE Final    Comment: (NOTE) The Xpert Xpress SARS-CoV-2/FLU/RSV assay is intended as an aid  in  the diagnosis of influenza from Nasopharyngeal swab specimens and  should not be used as a sole basis for treatment. Nasal washings and  aspirates are unacceptable for Xpert Xpress SARS-CoV-2/FLU/RSV  testing. Fact Sheet for Patients: PinkCheek.be Fact Sheet for Healthcare Providers: GravelBags.it This test is not yet approved or cleared by the [place] FDA and  has been authorized for detection and/or diagnosis of SARS-CoV-2 by  FDA under an Emergency Use Authorization (EUA). This EUA will remain  in effect (meaning this test can be used) for the duration of the  Covid-19 declaration under Section 564(b)(1) of the Act, 21  U.S.C. section 360bbb-3(b)(1), unless the authorization is  terminated or revoked. Performed at [facility], [address] [place], [place] [address]   Blood Culture (routine x 2)     Status: None (Preliminary result)   Collection Time: 12/22/19 12:14 PM  Specimen: BLOOD  Result Value Ref Range Status   Specimen Description BLOOD RAC  Final   Special Requests   Final    BOTTLES DRAWN AEROBIC AND ANAEROBIC Blood Culture adequate volume   Culture   Final    NO GROWTH 2 DAYS Performed at [facility], [address] [place], [place] [address]    Report Status PENDING  Incomplete  C difficile quick scan w PCR reflex     Status: None   Collection Time: 12/23/19  1:49 PM   Specimen: STOOL  Result Value Ref Range Status   C Diff antigen NEGATIVE NEGATIVE Final   C Diff toxin NEGATIVE NEGATIVE Final   C Diff interpretation No C. difficile detected.  Final    Comment: Performed at [facility], [address] [place], [place] [address]     Studies: [place] EKG SITE RITE  Result Date: 12/24/2019 If [facility] image not attached, placement could not be confirmed due to current cardiac rhythm.   Scheduled Meds: . enoxaparin (LOVENOX) injection  40 mg Subcutaneous  Q24H  . metoCLOPramide (REGLAN) injection  5 mg Intravenous Q6H  . multivitamin with minerals  1 tablet Oral Daily  . ondansetron (ZOFRAN) IV  4 mg Intravenous Q6H  . pantoprazole  40 mg Oral Daily  . rosuvastatin  10 mg Oral QHS  . sucralfate  1 g Oral TID   Continuous Infusions: . cefTRIAXone (ROCEPHIN)  IV    . chlorproMAZINE (THORAZINE) IV      Assessment/Plan:  1. Sepsis, present on admission with acute kidney injury.  Urinary source.  Patient has a urostomy.  Providencia stuartii growing on the urine culture with a resistance pattern.  Maxipime switched over to Rocephin.  PICC line ordered.  Creatinine coming down from 1.62 down to 1.22.  Case discussed with pharmacist and ID recommended 10 days of IV Rocephin. 2. Severe nausea with vomiting last night.  Change Reglan to IV.  Standing dose Zofran.  As needed Phenergan for breakthrough nausea.  Patient eating a little bit better today. 3. Hiccups.  As needed Thorazine. 4. GERD on Carafate and Protonix 5. Hyperlipidemia on Crestor  Code Status:     Code Status Orders  (From admission, onward)         Start     Ordered   12/22/19 1448  Full code  Continuous     12/22/19 1449        Code Status History    Date Active Date Inactive Code Status Order ID Comments User Context   04/24/2019 0036 04/29/2019 1658 Full Code [id number]  Christel Mormon, MD ED   11/02/2017 1730 11/05/2017 2040 Full Code [id number]  Demetrios Loll, MD Inpatient   10/10/2016 0205 10/11/2016 2124 Full Code [id number]  Harvie Bridge, DO Inpatient   06/03/2016 0429 06/08/2016 1914 Full Code [id number]  Saundra Shelling, MD Inpatient   04/21/2016 0238 04/26/2016 1815 Full Code [id number]  Harrie Foreman, MD Inpatient   Advance Care Planning Activity    Advance Directive Documentation     Most Recent Value  Type of Advance Directive  Healthcare Power of Attorney  Pre-existing out of facility DNR order (yellow form or pink MOST form)  --  "MOST" Form in Place?  --      Family Communication: Spoke with sister on the phone Disposition Plan: With resistance pattern on the urine culture, PICC line will be placed today.  Transitional care team looking into rehab options for him to  finish antibiotic course.  The patient's sister who takes care of him has Covid.  The patient is Covid negative.  Antibiotics:  Antibiotics switched over to Rocephin  Time spent: 28 minutes, case discussed with pharmacy and transitional care team  [facility]  [facility]

## 2019-12-24 NOTE — Progress Notes (Signed)
IID brief note.  Asked re abx selection for providencia UTI in pt with spina bifida, status post colostomy and urostomy who presented to the emergency room for evaluation of generalized weakness that he has had for a couple of days associated with nausea and generalized fatigue.  On admit wbc was 12, ua + bcx neg. Treated with cefepime and improving.   Rec Ceftriaxone  Total duration of 10 days.

## 2019-12-24 NOTE — Evaluation (Signed)
Physical Therapy Evaluation Patient Details Name: [name] MRN: [id number] DOB: 02-13-1966 Today's Date: 12/24/2019   History of Present Illness  Pt is a 54 y.o. male with a past medical history of spina bifida(bilateral LE paralysis), colostomy, urostomy, that presented to the emergency department for generalized fatigue, weakness, and nausea.    Clinical Impression  Pt in bed alert, agreeable to attempt mobility with PT. Per pt at baseline he is able to laterally scoot into his wheelchair mod I, dress upper body modI, takes bird bathes (sister assists with washing back and head), and sister assists with colostomy and urostomy bag changes.  The patient was able to move UE freely, resist PT and push PT. Pt able to move ea LE with his UE, able to sit forward, and prop with UE support in long sitting. Unable to transfer to EOB due to inflated colostomy bag, pt reported that the recliner was positioned incorrectly for his mobility compared at home (only one recliner arm drops). Deferred per pt request. Overall the patient demonstrated near return to baseline level of functioning, kept on PT caseload to perhaps attempt [facility] transfer when pt is able to utilize his own.     Follow Up Recommendations Home health PT    Equipment Recommendations  None recommended by PT    Recommendations for Other Services       Precautions / Restrictions Precautions Precautions: Fall Precaution Comments: LE paralysis      Mobility  Bed Mobility Overal bed mobility: Modified Independent             General bed mobility comments: Pt able to move ea LE with his UE, able to sit forward, and porop with UE support. Unable to transfer to EOB due to inflated colostomy bag, pt reported that the recliner was positioned incorrectly for his mobility compared at home (only one recliner arm drops). Deferred per pt request.  Transfers                    Ambulation/Gait                 Stairs            Wheelchair Mobility    Modified Rankin (Stroke Patients Only)       Balance Overall balance assessment: Needs assistance Sitting-balance support: Feet supported Sitting balance-Leahy Scale: Poor Sitting balance - Comments: reliant on UE support                                     Pertinent Vitals/Pain Pain Assessment: No/denies pain    Home Living Family/patient expects to be discharged to:: Private residence Living Arrangements: Other relatives;Other (Comment)(sister and her sister's boyfriend) Available Help at Discharge: Family Type of Home: Mobile home Home Access: Ramped entrance     Home Layout: One level Home Equipment: Hospital bed;Wheelchair - manual      Prior Function Level of Independence: Needs assistance   Gait / Transfers Assistance Needed: pt laterally scoots modI to WC at baseline  ADL's / Homemaking Assistance Needed: sisters assists with colostomy/urostomy bag changes. assists with set up/washes pts head/back. pt dresses modI        Hand Dominance   Dominant Hand: Right    Extremity/Trunk Assessment   Upper Extremity Assessment Upper Extremity Assessment: Defer to OT evaluation(Pt able to move UE freely, resist PT/push PT)    Lower Extremity Assessment Lower  Extremity Assessment: (LE paralysis)    Cervical / Trunk Assessment Cervical / Trunk Assessment: Kyphotic  Communication   Communication: No difficulties  Cognition Arousal/Alertness: Awake/alert Behavior During Therapy: WFL for tasks assessed/performed Overall Cognitive Status: Within Functional Limits for tasks assessed                                        General Comments      Exercises     Assessment/Plan    PT Assessment Patient needs continued PT services  PT Problem List Decreased activity tolerance       PT Treatment Interventions DME instruction;Therapeutic activities;Therapeutic  exercise;Patient/family education;Neuromuscular re-education;Functional mobility training    PT Goals (Current goals can be found in the Care Plan section)  Acute Rehab PT Goals Patient Stated Goal: to go home PT Goal Formulation: With patient Time For Goal Achievement: 01/07/20 Potential to Achieve Goals: Good    Frequency Min 2X/week   Barriers to discharge        Co-evaluation               AM-PAC PT "6 Clicks" Mobility  Outcome Measure Help needed turning from your back to your side while in a flat bed without using bedrails?: A Lot Help needed moving from lying on your back to sitting on the side of a flat bed without using bedrails?: A Lot Help needed moving to and from a bed to a chair (including a wheelchair)?: A Little Help needed standing up from a chair using your arms (e.g., wheelchair or bedside chair)?: Total Help needed to walk in hospital room?: Total Help needed climbing 3-5 steps with a railing? : Total 6 Click Score: 10    End of Session   Activity Tolerance: Patient tolerated treatment well Patient left: in bed;with call bell/phone within reach;with bed alarm set Nurse Communication: Mobility status PT Visit Diagnosis: Other abnormalities of gait and mobility (R26.89)    Time: [id number] PT Time Calculation (min) (ACUTE ONLY): 33 min   Charges:   PT Evaluation $PT Eval Moderate Complexity: 1 Mod PT Treatments $Therapeutic Activity: 8-22 mins        Lieutenant Diego PT, DPT 1:09 PM,12/24/19

## 2019-12-24 NOTE — Progress Notes (Signed)
PICC order received.  Spoke with bedside nurse regarding whether patient will be able to lay on back for procedure.  She states that she thinks he can.  VAST will evaluate when at bedside.  If unable to remain on back for procedure, alternate options for central access will have to be reviewed.  RN made aware that efforts will be made to place PICC today but may be tomorrow before able to place.  Bedside RN states that this will be okay.

## 2019-12-24 NOTE — TOC Initial Note (Signed)
Transition of Care [facility]) - Initial/Assessment Note    Patient Details  Name: [name] MRN: [id number] Date of Birth: 1966/03/22  [facility] CM/SW Contact:    Shelbie Ammons, RN Phone Number: 12/24/2019, 2:21 PM  Clinical Narrative:    RNCM assessed patient at bedside. Patient lying in bed with music playing on tv, reports to feeling ok today. Explained CM role and what would be discussed, patient was agreeable to this. Patient lives in single level home alone but has a sister that assists in his care. He is normally able to walk with walker and reports he has all necessary equipment in the home. His PCP is Dr. Doy Hutching and he uses [facility] [address] for his medications.   Discussed with patient that SW had notified this CM that he would like to go to rehab at discharge because he "can't go back home" due to his sister who is his primary caregiver having Covid. Discussed with patient that typically the decision of whether someone is eligible for rehab comes from the therapist recommendation but that since he will need IV antibiotics this may in fact be a skilled need. Patient reports he has been to [facility] before and would like to go back there. Patient does however report that he is open to other places as well if they can't offer him a bed. PASSR verified, FL2 completed and bed search initiated.    Expected Discharge Plan: Skilled Nursing Facility Barriers to Discharge: Continued Medical Work up   Patient Goals and CMS Choice     Choice offered to / list presented to : Patient  Expected Discharge Plan and Services Expected Discharge Plan: [place]   Discharge Planning Services: CM Consult Post Acute Care Choice: [place] Living arrangements for the past 2 months: Single Family Home                                      Prior Living Arrangements/Services Living arrangements for the past 2 months: Single  Family Home Lives with:: Self Patient language and need for interpreter reviewed:: Yes Do you feel safe going back to the place where you live?: Yes      Need for Family Participation in Patient Care: Yes (Comment) Care giver support system in place?: Yes (comment)   Criminal Activity/Legal Involvement Pertinent to Current Situation/Hospitalization: No - Comment as needed  Activities of Daily Living Home Assistive Devices/Equipment: Hospital bed, Wheelchair ADL Screening (condition at time of admission) Patient's cognitive ability adequate to safely complete daily activities?: Yes Is the patient deaf or have difficulty hearing?: No Does the patient have difficulty seeing, even when wearing glasses/contacts?: No Does the patient have difficulty concentrating, remembering, or making decisions?: No Patient able to express need for assistance with ADLs?: Yes Does the patient have difficulty dressing or bathing?: Yes Independently performs ADLs?: No Communication: Independent Dressing (OT): Independent Grooming: Independent Feeding: Independent Bathing: Needs assistance Is this a change from baseline?: Pre-admission baseline Toileting: Needs assistance Is this a change from baseline?: Pre-admission baseline In/Out Bed: Needs assistance Is this a change from baseline?: Pre-admission baseline Walks in Home: Dependent Is this a change from baseline?: Pre-admission baseline Does the patient have difficulty walking or climbing stairs?: Yes Weakness of Legs: Both Weakness of Arms/Hands: None  Permission Sought/Granted Permission sought to share information with : Family Supports    Share Information  with NAME: [name]           Emotional Assessment Appearance:: Appears stated age Attitude/Demeanor/Rapport: Engaged Affect (typically observed): Appropriate Orientation: : Oriented to Self, Oriented to Place, Oriented to  Time, Oriented to Situation Alcohol / Substance Use: Not  Applicable Psych Involvement: No (comment)  Admission diagnosis:  Sepsis ([place]) [A41.9] Urinary tract infection without hematuria, site unspecified [N39.0] Sepsis, due to unspecified organism, unspecified whether acute organ dysfunction present [facility]) [A41.9] Patient Active Problem List   Diagnosis Date Noted  . Hiccups   . Acute lower UTI 12/22/2019  . GERD (gastroesophageal reflux disease) 12/22/2019  . SBO (small bowel obstruction) ([place]) 11/02/2017  . Abdominal distention 10/11/2016  . Distal intestinal obstruction syndrome ([place]) 10/11/2016  . Hypokalemia 10/11/2016  . Leukocytosis 10/11/2016  . Loose stools 10/11/2016  . Fecal impaction in rectum ([place]) 10/10/2016  . Nausea & vomiting 06/03/2016  . Gastric outlet obstruction 06/03/2016  . SIRS (systemic inflammatory response syndrome) ([place]) 06/03/2016  . Pressure ulcer 06/03/2016  . Impaction of the bowels ([place])   . Gastritis   . Ulcer of esophagus with bleeding   . Nausea with vomiting   . Nausea vomiting and diarrhea   . Sepsis ([place]) 04/21/2016   PCP:  Idelle Crouch, MD Pharmacy:   [facility], [place] [address] Phone: [phone] Fax: [place], [place] - [address] [place] [address] Phone: [phone] Fax: [place] [address] [place]), [place] - [place]) [place] [address] Phone: [phone] Fax: [phone]     Social Determinants of Health (SDOH) Interventions    Readmission Risk Interventions No flowsheet data found.

## 2019-12-24 NOTE — NC FL2 (Signed)
[place] LEVEL OF CARE SCREENING TOOL     IDENTIFICATION  Patient Name: [name] Birthdate: 1966-09-06 Sex: male Admission Date (Current Location): 12/22/2019  [place] and [place] Number:  Engineering geologist and Address:  [facility], [address] [place], [place] [address]      Provider Number: [id number]  Attending Physician Name and Address:  Loletha Grayer, MD  Relative Name and Phone Number:  [name] [phone]    Current Level of Care: Hospital Recommended Level of Care: [place] Prior Approval Number:    Date Approved/Denied:   PASRR Number: [id number]  Discharge Plan: SNF    Current Diagnoses: Patient Active Problem List   Diagnosis Date Noted  . Hiccups   . Acute lower UTI 12/22/2019  . GERD (gastroesophageal reflux disease) 12/22/2019  . SBO (small bowel obstruction) ([place]) 11/02/2017  . Abdominal distention 10/11/2016  . Distal intestinal obstruction syndrome ([place]) 10/11/2016  . Hypokalemia 10/11/2016  . Leukocytosis 10/11/2016  . Loose stools 10/11/2016  . Fecal impaction in rectum ([place]) 10/10/2016  . Nausea & vomiting 06/03/2016  . Gastric outlet obstruction 06/03/2016  . SIRS (systemic inflammatory response syndrome) ([place]) 06/03/2016  . Pressure ulcer 06/03/2016  . Impaction of the bowels ([place])   . Gastritis   . Ulcer of esophagus with bleeding   . Nausea with vomiting   . Nausea vomiting and diarrhea   . Sepsis ([place]) 04/21/2016    Orientation RESPIRATION BLADDER Height & Weight     Self, Time, Situation, Place  Normal Urostomy Weight: 61.2 kg Height:  4\' 6"  (137.2 cm)(Per RN, patient is quite short. He is unable to stand)  BEHAVIORAL SYMPTOMS/MOOD NEUROLOGICAL BOWEL NUTRITION STATUS      Colostomy Diet(2 gram sodium)  AMBULATORY STATUS COMMUNICATION OF NEEDS Skin   (Wheelchair) Verbally Normal                       Personal Care Assistance Level of  Assistance  Dressing, Feeding, Bathing Bathing Assistance: Limited assistance Feeding assistance: Limited assistance Dressing Assistance: Limited assistance     Functional Limitations Info             SPECIAL CARE FACTORS FREQUENCY  PT (By licensed PT), OT (By licensed OT)                    Contractures Contractures Info: Present    Additional Factors Info  Code Status, Allergies Code Status Info: Full Allergies Info: IVP dye, Morphine, Sulfa, Latex           Current Medications (12/24/2019):  This is the current hospital active medication list Current Facility-Administered Medications  Medication Dose Route Frequency Provider Last Rate Last Admin  . acetaminophen (TYLENOL) tablet 650 mg  650 mg Oral Q6H PRN Agbata, Tochukwu, MD       Or  . acetaminophen (TYLENOL) suppository 650 mg  650 mg Rectal Q6H PRN Agbata, Tochukwu, MD      . cefTRIAXone (ROCEPHIN) 2 g in sodium chloride 0.9 % 100 mL IVPB  2 g Intravenous Q24H Dallie Piles, RPH      . chlorproMAZINE (THORAZINE) 12.5 mg in sodium chloride 0.9 % 25 mL IVPB  12.5 mg Intravenous Q8H PRN Wieting, Richard, MD      . enoxaparin (LOVENOX) injection 40 mg  40 mg Subcutaneous Q24H Agbata, Tochukwu, MD   40 mg at 12/23/19 1459  . metoCLOPramide (REGLAN) injection 5 mg  5  mg Intravenous Q6H Loletha Grayer, MD   5 mg at 12/24/19 1237  . multivitamin with minerals tablet 1 tablet  1 tablet Oral Daily Agbata, Tochukwu, MD   1 tablet at 12/24/19 1004  . ondansetron (ZOFRAN) injection 4 mg  4 mg Intravenous Q6H Loletha Grayer, MD   4 mg at 12/24/19 1237  . pantoprazole (PROTONIX) EC tablet 40 mg  40 mg Oral Daily Agbata, Tochukwu, MD      . promethazine (PHENERGAN) injection 12.5 mg  12.5 mg Intravenous Q6H PRN Wieting, Richard, MD      . rosuvastatin (CRESTOR) tablet 10 mg  10 mg Oral QHS Agbata, Tochukwu, MD   10 mg at 12/23/19 2110  . sucralfate (CARAFATE) tablet 1 g  1 g Oral TID Agbata, Tochukwu, MD   1 g at  12/24/19 1004     Discharge Medications: Please see discharge summary for a list of discharge medications.  Relevant Imaging Results:  Relevant Lab Results:   Additional Information SS# [id number]  Shelbie Ammons, RN

## 2019-12-24 NOTE — Evaluation (Signed)
Occupational Therapy Evaluation Patient Details Name: [name] MRN: [id number] DOB: [birth year] Today's Date: 12/24/2019    History of Present Illness Pt is a 54 y.o. male with a past medical history of spina bifida(bilateral LE paralysis), colostomy, urostomy, that presented to the emergency department for generalized fatigue, weakness, and nausea.   Clinical Impression   Pt was seen for OT evaluation this date. Prior to hospital admission, pt was mod indep with most ADL, requiring assist for urostomy and colostomy mgt and bathing. Pt lives by himself in a ramped 1 story home and his sister leaves nearby and assists when needed. Currently pt reports feeling near baseline, just a bit weaker than normal. Pt mod indep with bed mobility and demonstrates near baseline for ADL performance. Do not anticipate additional need for skilled OT services at this time. Decreased caregiver assist at this time due to pt reporting sister is recovering from 0000000 which may complicate his discharge. Per pt will also need IV antibiotics.     Follow Up Recommendations  No OT follow up    Equipment Recommendations  Tub/shower bench    Recommendations for Other Services       Precautions / Restrictions Precautions Precautions: Fall Precaution Comments: LE paralysis      Mobility Bed Mobility Overal bed mobility: Modified Independent             General bed mobility comments: Pt able to move ea LE with his UE, able to sit forward, and porop with UE support. Unable to transfer to EOB due to inflated colostomy bag, pt reported that the recliner was positioned incorrectly for his mobility compared at home (only one recliner arm drops). Deferred per pt request.  Transfers                      Balance Overall balance assessment: Needs assistance Sitting-balance support: Feet supported Sitting balance-Leahy Scale: Poor Sitting balance - Comments: reliant on UE support                                    ADL either performed or assessed with clinical judgement   ADL Overall ADL's : At baseline                                       General ADL Comments: Pt requires some assist from sister at baseline for bathing, transportation. Pt mod indep with UB ADL and lateral scoot transfers     Vision Baseline Vision/History: Wears glasses(L eye deviates medially) Patient Visual Report: No change from baseline       Perception     Praxis      Pertinent Vitals/Pain Pain Assessment: No/denies pain     Hand Dominance Right   Extremity/Trunk Assessment Upper Extremity Assessment Upper Extremity Assessment: Overall WFL for tasks assessed(generally WFL bilaterally, pt reports remote L shoulder injury that does not impede his movement/function but does occasionally cause mild discomfort)   Lower Extremity Assessment Lower Extremity Assessment: (LE paralysis)   Cervical / Trunk Assessment Cervical / Trunk Assessment: Kyphotic   Communication Communication Communication: No difficulties   Cognition Arousal/Alertness: Awake/alert Behavior During Therapy: WFL for tasks assessed/performed Overall Cognitive Status: Within Functional Limits for tasks assessed  General Comments       Exercises     Shoulder Instructions      Home Living Family/patient expects to be discharged to:: Private residence Living Arrangements: Other relatives;Other (Comment)(sister and her sister's boyfriend) Available Help at Discharge: Family Type of Home: Mobile home Home Access: Ramped entrance     Home Layout: One level     Bathroom Shower/Tub: Walk-in shower         Home Equipment: Hospital bed;Wheelchair - manual          Prior Functioning/Environment Level of Independence: Needs assistance  Gait / Transfers Assistance Needed: pt laterally scoots modI to WC at baseline ADL's /  Homemaking Assistance Needed: sisters assists with colostomy/urostomy bag changes. assists with set up/washes pts head/back. pt dresses modI, sister provides transportation, pt indep with med mgt            OT Problem List:        OT Treatment/Interventions:      OT Goals(Current goals can be found in the care plan section) Acute Rehab OT Goals Patient Stated Goal: to go to rehab while on IV antibiotics and while sister (primary/only caregiver) recovers from Covid OT Goal Formulation: All assessment and education complete, DC therapy  OT Frequency:     Barriers to D/C:            Co-evaluation              AM-PAC OT "6 Clicks" Daily Activity     Outcome Measure Help from another person eating meals?: None Help from another person taking care of personal grooming?: None Help from another person toileting, which includes using toliet, bedpan, or urinal?: None Help from another person bathing (including washing, rinsing, drying)?: A Little Help from another person to put on and taking off regular upper body clothing?: None Help from another person to put on and taking off regular lower body clothing?: A Little 6 Click Score: 22   End of Session    Activity Tolerance: Patient tolerated treatment well Patient left: in bed;with call bell/phone within reach;with bed alarm set  OT Visit Diagnosis: Other abnormalities of gait and mobility (R26.89)                Time: [id number] OT Time Calculation (min): 16 min Charges:  OT General Charges $OT Visit: 1 Visit OT Evaluation $OT Eval Moderate Complexity: 1 Mod  Jeni Salles, MPH, MS, OTR/L ascom [phone] 12/24/19, 3:51 PM

## 2019-12-25 DIAGNOSIS — R110 Nausea: Secondary | ICD-10-CM

## 2019-12-25 LAB — RESPIRATORY PANEL BY RT PCR (FLU A&B, COVID)
Influenza A by PCR: NEGATIVE
Influenza B by PCR: NEGATIVE
SARS Coronavirus 2 by RT PCR: NEGATIVE

## 2019-12-25 MED ORDER — SODIUM CHLORIDE 0.9% FLUSH: 10.0000 mL | INTRAVENOUS | Status: DC | PRN

## 2019-12-25 MED ORDER — SODIUM CHLORIDE 0.9 % IV SOLN
INTRAVENOUS | Status: DC | PRN
  Administered 2019-12-25: 22:00:00 1000 mL via INTRAVENOUS

## 2019-12-25 MED ORDER — CHLORPROMAZINE HCL 10 MG PO TABS: 10.0000 mg | ORAL_TABLET | Freq: Three times a day (TID) | ORAL | 0 refills | Status: DC | PRN

## 2019-12-25 MED ORDER — ACETAMINOPHEN 325 MG PO TABS: 650.0000 mg | ORAL_TABLET | Freq: Four times a day (QID) | ORAL | Status: DC | PRN

## 2019-12-25 MED ORDER — SODIUM CHLORIDE 0.9 % IV SOLN: 2.0000 g | Freq: Every evening | INTRAVENOUS | 0 refills | Status: DC

## 2019-12-25 MED ORDER — PROMETHAZINE HCL 12.5 MG PO TABS: 12.5000 mg | ORAL_TABLET | Freq: Four times a day (QID) | ORAL | 0 refills | Status: DC | PRN

## 2019-12-25 MED ORDER — CHLORHEXIDINE GLUCONATE CLOTH 2 % EX PADS
6.0000 | MEDICATED_PAD | Freq: Every day | CUTANEOUS | Status: DC
  Administered 2019-12-25 – 2019-12-26 (×2): 6 via TOPICAL

## 2019-12-25 NOTE — Progress Notes (Signed)
Peripherally Inserted Central Catheter Placement  The IV Nurse has discussed with the patient and/or persons authorized to consent for the patient, the purpose of this procedure and the potential benefits and risks involved with this procedure.  The benefits include less needle sticks, lab draws from the catheter, and the patient may be discharged home with the catheter. Risks include, but not limited to, infection, bleeding, blood clot (thrombus formation), and puncture of an artery; nerve damage and irregular heartbeat and possibility to perform a PICC exchange if needed/ordered by physician.  Alternatives to this procedure were also discussed.  Bard Power PICC patient education guide, fact sheet on infection prevention and patient information card has been provided to patient /or left at bedside.    PICC Placement Documentation  PICC Single Lumen 12/25/19 PICC Right Brachial 36 cm 0 cm (Active)  Indication for Insertion or Continuance of Line Prolonged intravenous therapies;Home intravenous therapies (PICC only) 12/25/19 1125  Exposed Catheter (cm) 0 cm 12/25/19 1125  Site Assessment Clean;Dry;Intact 12/25/19 1125  Line Status Flushed;Saline locked;Blood return noted 12/25/19 1125  Dressing Type Transparent;Securing device 12/25/19 1125  Dressing Status Clean;Dry;Intact;Antimicrobial disc in place 12/25/19 1125  Dressing Intervention New dressing 12/25/19 1125  Dressing Change Due 01/01/20 12/25/19 1125       [name] 12/25/2019, 11:43 AM

## 2019-12-25 NOTE — TOC Transition Note (Addendum)
Transition of Care [facility]) - CM/SW Discharge Note   Patient Details  Name: [name] MRN: [id number] Date of Birth: 07-13-1966  [facility] CM/SW Contact:  Candie Chroman, LCSW Phone Number: 12/25/2019, 1:33 PM   Clinical Narrative: Patient has orders to discharge to [facility] today. RN will call report to [phone] (Room 508). EMS has been set up and patient is 5th on the list. Sister is aware. Asked nurse to call her when they arrive to pick him up. No further concerns. CSW signing off.    3:00 pm: [facility] unable to take patient today because they miscounted how many IV pumps they have. One will not be available until tomorrow. Asked if we could give the abx today and then have them give it late tomorrow but they said no. MD, RN, and patient aware. SNF will call patient's sister. Called and cancelled EMS transport.  Final next level of care: [place] Barriers to Discharge: Barriers Resolved   Patient Goals and CMS Choice     Choice offered to / list presented to : Patient, Sibling  Discharge Placement   Existing PASRR number confirmed : 12/24/19          Patient [facility] bed at: [facility] Patient to be transferred to facility by: EMS Name of family member notified: [name] Patient and family notified of of transfer: 12/25/19  Discharge Plan and Services   Discharge Planning Services: CM Consult Post Acute Care Choice: [place]                               Social Determinants of Health (SDOH) Interventions     Readmission Risk Interventions No flowsheet data found.

## 2019-12-25 NOTE — Progress Notes (Signed)
Nausea with emesis. Hiccups. Zofran and reglan helped and BM after Reglan. Thorazine for hiccups

## 2019-12-25 NOTE — Progress Notes (Signed)
Patient ID: [name], male   DOB: [birth year], 54 y.o.   MRN: [id number] [facility] PROGRESS NOTE  Mynor Hilfiker [id number] DOB: 1965/10/21 DOA: 12/22/2019 PCP: Idelle Crouch, MD  HPI/Subjective: Patient seen this morning and was having nausea.  He states he always has nausea.  UEs has a little abdominal pain.  Objective: Vitals:   12/25/19 0537 12/25/19 1158  BP: (!) 151/97 (!) 152/92  Pulse: (!) 106 94  Resp: 18   Temp: 98.2 F (36.8 C) 97.9 F (36.6 C)  SpO2: 98% 99%    Filed Weights   12/22/19 1201  Weight: 61.2 kg    ROS: Review of Systems  Constitutional: Negative for fever.  Eyes: Negative for blurred vision.  Respiratory: Negative for shortness of breath.   Cardiovascular: Negative for chest pain.  Gastrointestinal: Positive for abdominal pain and nausea. Negative for constipation, diarrhea and vomiting.  Genitourinary: Negative for dysuria.  Musculoskeletal: Negative for joint pain.  Neurological: Negative for dizziness and headaches.   Exam: Physical Exam  HENT:  Nose: No mucosal edema.  Mouth/Throat: No oropharyngeal exudate or posterior oropharyngeal edema.  Eyes: Conjunctivae and lids are normal.  Cardiovascular: Regular rhythm, S1 normal and S2 normal. Exam reveals no gallop.  No murmur heard. Respiratory: No respiratory distress. He has decreased breath sounds in the right lower field and the left lower field. He has no wheezes. He has no rhonchi. He has no rales.  GI: Soft. Bowel sounds are normal. There is no abdominal tenderness.  Musculoskeletal:     Right ankle: No swelling.     Left ankle: No swelling.  Lymphadenopathy:    He has no cervical adenopathy.  Neurological: He is alert.  Lower extremity paralysis  Skin: Skin is warm. Nails show no clubbing.  Psychiatric: He has a normal mood and affect.      Data Reviewed: Basic Metabolic Panel: Recent Labs  Lab 12/22/19 1213 12/23/19 0727 12/24/19 0646  NA 139  149* 143  K 4.5 4.0 3.4*  CL 102 117* 117*  CO2 21* 15* 17*  GLUCOSE 83 62* 99  BUN 25* 22* 16  CREATININE 1.62* 1.36* 1.22  CALCIUM 9.5 8.6* 9.0   Liver Function Tests: Recent Labs  Lab 12/22/19 1213  AST 39  ALT 31  ALKPHOS 111  BILITOT 1.3*  PROT 8.6*  ALBUMIN 4.1   CBC: Recent Labs  Lab 12/22/19 1213 12/23/19 0727 12/24/19 0646  WBC 12.5* 8.6 7.2  NEUTROABS 10.5*  --   --   HGB 15.0 12.9* 12.6*  HCT 48.1 44.3 40.6  MCV 88.4 93.9 87.7  PLT 288 249 249     Recent Results (from the past 240 hour(s))  Blood Culture (routine x 2)     Status: None (Preliminary result)   Collection Time: 12/22/19 12:13 PM   Specimen: BLOOD  Result Value Ref Range Status   Specimen Description BLOOD R ARM  Final   Special Requests   Final    BOTTLES DRAWN AEROBIC AND ANAEROBIC Blood Culture results may not be optimal due to an inadequate volume of blood received in culture bottles   Culture   Final    NO GROWTH 3 DAYS Performed at [facility], [address], [place], [place] [address]    Report Status PENDING  Incomplete  Urine culture     Status: Abnormal   Collection Time: 12/22/19 12:13 PM   Specimen: In/Out Cath Urine  Result Value Ref Range Status   Specimen Description  IN/OUT CATH URINE  Final   Special Requests   Final    NONE Performed at [facility], [address] [place], [place] [address]    Culture >=100,000 COLONIES/mL PROVIDENCIA STUARTII (A)  Final   Report Status 12/24/2019 FINAL  Final   Organism ID, Bacteria PROVIDENCIA STUARTII (A)  Final      Susceptibility   Providencia stuartii - MIC*    AMPICILLIN >=32 RESISTANT Resistant     CEFAZOLIN >=64 RESISTANT Resistant     CEFTRIAXONE <=0.25 SENSITIVE Sensitive     CIPROFLOXACIN >=4 RESISTANT Resistant     GENTAMICIN RESISTANT Resistant     IMIPENEM 8 INTERMEDIATE Intermediate     NITROFURANTOIN 256 RESISTANT Resistant     TRIMETH/SULFA >=320 RESISTANT Resistant      AMPICILLIN/SULBACTAM >=32 RESISTANT Resistant     PIP/TAZO 8 SENSITIVE Sensitive     * >=100,000 COLONIES/mL PROVIDENCIA STUARTII  Respiratory Panel by RT PCR (Flu A&B, Covid) -     Status: None   Collection Time: 12/22/19 12:13 PM  Result Value Ref Range Status   SARS Coronavirus 2 by RT PCR NEGATIVE NEGATIVE Final    Comment: (NOTE) SARS-CoV-2 target nucleic acids are NOT DETECTED. The SARS-CoV-2 RNA is generally detectable in upper respiratoy specimens during the acute phase of infection. The lowest concentration of SARS-CoV-2 viral copies this assay can detect is 131 copies/mL. A negative result does not preclude SARS-Cov-2 infection and should not be used as the sole basis for treatment or other patient management decisions. A negative result may occur with  improper specimen collection/handling, submission of specimen other than nasopharyngeal swab, presence of viral mutation(s) within the areas targeted by this assay, and inadequate number of viral copies (<131 copies/mL). A negative result must be combined with clinical observations, patient history, and epidemiological information. The expected result is Negative. Fact Sheet for Patients:  PinkCheek.be Fact Sheet for Healthcare Providers:  GravelBags.it This test is not yet ap proved or cleared by the [place] FDA and  has been authorized for detection and/or diagnosis of SARS-CoV-2 by FDA under an Emergency Use Authorization (EUA). This EUA will remain  in effect (meaning this test can be used) for the duration of the COVID-19 declaration under Section 564(b)(1) of the Act, 21 U.S.C. section 360bbb-3(b)(1), unless the authorization is terminated or revoked sooner.    Influenza A by PCR NEGATIVE NEGATIVE Final   Influenza B by PCR NEGATIVE NEGATIVE Final    Comment: (NOTE) The Xpert Xpress SARS-CoV-2/FLU/RSV assay is intended as an aid in  the diagnosis of  influenza from Nasopharyngeal swab specimens and  should not be used as a sole basis for treatment. Nasal washings and  aspirates are unacceptable for Xpert Xpress SARS-CoV-2/FLU/RSV  testing. Fact Sheet for Patients: PinkCheek.be Fact Sheet for Healthcare Providers: GravelBags.it This test is not yet approved or cleared by the [place] FDA and  has been authorized for detection and/or diagnosis of SARS-CoV-2 by  FDA under an Emergency Use Authorization (EUA). This EUA will remain  in effect (meaning this test can be used) for the duration of the  Covid-19 declaration under Section 564(b)(1) of the Act, 21  U.S.C. section 360bbb-3(b)(1), unless the authorization is  terminated or revoked. Performed at [facility], [address] [place], [place] [address]   Blood Culture (routine x 2)     Status: None (Preliminary result)   Collection Time: 12/22/19 12:14 PM   Specimen: BLOOD  Result Value Ref Range Status  Specimen Description BLOOD RAC  Final   Special Requests   Final    BOTTLES DRAWN AEROBIC AND ANAEROBIC Blood Culture adequate volume   Culture   Final    NO GROWTH 3 DAYS Performed at [facility], [address] [place], [place] [address]    Report Status PENDING  Incomplete  C difficile quick scan w PCR reflex     Status: None   Collection Time: 12/23/19  1:49 PM   Specimen: STOOL  Result Value Ref Range Status   C Diff antigen NEGATIVE NEGATIVE Final   C Diff toxin NEGATIVE NEGATIVE Final   C Diff interpretation No C. difficile detected.  Final    Comment: Performed at [facility], [address] [place], [place] [address]  Respiratory Panel by RT PCR (Flu A&B, Covid) - Nasopharyngeal Swab     Status: None   Collection Time: 12/25/19 12:01 PM   Specimen: Nasopharyngeal Swab  Result Value Ref Range Status   SARS Coronavirus 2 by RT PCR NEGATIVE NEGATIVE Final    Comment:  (NOTE) SARS-CoV-2 target nucleic acids are NOT DETECTED. The SARS-CoV-2 RNA is generally detectable in upper respiratoy specimens during the acute phase of infection. The lowest concentration of SARS-CoV-2 viral copies this assay can detect is 131 copies/mL. A negative result does not preclude SARS-Cov-2 infection and should not be used as the sole basis for treatment or other patient management decisions. A negative result may occur with  improper specimen collection/handling, submission of specimen other than nasopharyngeal swab, presence of viral mutation(s) within the areas targeted by this assay, and inadequate number of viral copies (<131 copies/mL). A negative result must be combined with clinical observations, patient history, and epidemiological information. The expected result is Negative. Fact Sheet for Patients:  PinkCheek.be Fact Sheet for Healthcare Providers:  GravelBags.it This test is not yet ap proved or cleared by the [place] FDA and  has been authorized for detection and/or diagnosis of SARS-CoV-2 by FDA under an Emergency Use Authorization (EUA). This EUA will remain  in effect (meaning this test can be used) for the duration of the COVID-19 declaration under Section 564(b)(1) of the Act, 21 U.S.C. section 360bbb-3(b)(1), unless the authorization is terminated or revoked sooner.    Influenza A by PCR NEGATIVE NEGATIVE Final   Influenza B by PCR NEGATIVE NEGATIVE Final    Comment: (NOTE) The Xpert Xpress SARS-CoV-2/FLU/RSV assay is intended as an aid in  the diagnosis of influenza from Nasopharyngeal swab specimens and  should not be used as a sole basis for treatment. Nasal washings and  aspirates are unacceptable for Xpert Xpress SARS-CoV-2/FLU/RSV  testing. Fact Sheet for Patients: PinkCheek.be Fact Sheet for Healthcare  Providers: GravelBags.it This test is not yet approved or cleared by the [place] FDA and  has been authorized for detection and/or diagnosis of SARS-CoV-2 by  FDA under an Emergency Use Authorization (EUA). This EUA will remain  in effect (meaning this test can be used) for the duration of the  Covid-19 declaration under Section 564(b)(1) of the Act, 21  U.S.C. section 360bbb-3(b)(1), unless the authorization is  terminated or revoked. Performed at [facility], [address] [place], [place] [address]      Studies: [place] EKG SITE RITE  Result Date: 12/24/2019 If [facility] image not attached, placement could not be confirmed due to current cardiac rhythm.   Scheduled Meds: . Chlorhexidine Gluconate Cloth  6 each Topical Daily  . enoxaparin (LOVENOX) injection  40 mg Subcutaneous Q24H  . metoCLOPramide (REGLAN) injection  5 mg Intravenous Q6H  . multivitamin with minerals  1 tablet Oral Daily  . ondansetron (ZOFRAN) IV  4 mg Intravenous Q6H  . pantoprazole  40 mg Oral Daily  . rosuvastatin  10 mg Oral QHS  . sucralfate  1 g Oral TID   Continuous Infusions: . cefTRIAXone (ROCEPHIN)  IV 2 g (12/24/19 2048)  . chlorproMAZINE (THORAZINE) IV 12.5 mg (12/25/19 0008)    Assessment/Plan:  1. Sepsis, present on admission with acute kidney injury.  Urinary source.  Patient has a urostomy.  Providencia stuartii growing on the urine culture with a resistance pattern.  Maxipime switched over to Rocephin.  PICC line placed today.  Creatinine coming down from 1.62 down to 1/22.  Facility does not have a pump to give antibiotics so I was told I cannot get him out to the facility today.  Plan on disposition tomorrow. 2. Severe nausea.  No further vomiting. as needed Phenergan.  On Reglan IV and Zofran IV. 3. Hiccups.  As needed Thorazine. 4. GERD on Carafate and Protonix 5. Hyperlipidemia on Crestor  Code Status:     Code Status Orders  (From  admission, onward)         Start     Ordered   12/22/19 1448  Full code  Continuous     12/22/19 1449        Code Status History    Date Active Date Inactive Code Status Order ID Comments User Context   04/24/2019 0036 04/29/2019 1658 Full Code [id number]  Mansy, Arvella Merles, MD ED   11/02/2017 1730 11/05/2017 2040 Full Code [id number]  Demetrios Loll, MD Inpatient   10/10/2016 0205 10/11/2016 2124 Full Code [id number]  Harvie Bridge, DO Inpatient   06/03/2016 0429 06/08/2016 1914 Full Code [id number]  Saundra Shelling, MD Inpatient   04/21/2016 0238 04/26/2016 1815 Full Code [id number]  Harrie Foreman, MD Inpatient   Advance Care Planning Activity    Advance Directive Documentation     Most Recent Value  Type of Advance Directive  Healthcare Power of Attorney  Pre-existing out of facility DNR order (yellow form or pink MOST form)  -  "MOST" Form in Place?  -     Family Communication: Spoke with sister on the phone Disposition Plan: Facility unable to take the patient to rehab today because they do not have a pump to give the IV antibiotics.  Antibiotics:  Rocephin  Time spent: 38 minutes  [place]

## 2019-12-25 NOTE — Progress Notes (Signed)
Patient ID: [name], male   DOB: 08/21/1966, 54 y.o.   MRN: [id number]  I was told that I have to cancel discharge.  Rehab facility does not have a pump to give antibiotic.  Even if we gave the antibiotic today prior to disposition.  Dr. Leslye Peer

## 2019-12-25 NOTE — Discharge Summary (Addendum)
[facility] NAME: [name]    MR#:  [id number]  DATE OF BIRTH:  12-May-1966  DATE OF ADMISSION:  12/22/2019 ADMITTING PHYSICIAN: Collier Bullock, MD  DATE OF DISCHARGE: 12/26/2019  PRIMARY CARE PHYSICIAN: Idelle Crouch, MD    ADMISSION DIAGNOSIS:  Sepsis ([place]) [A41.9] Urinary tract infection without hematuria, site unspecified [N39.0] Sepsis, due to unspecified organism, unspecified whether acute organ dysfunction present ([place]) [A41.9]  DISCHARGE DIAGNOSIS:  Principal Problem:   Sepsis ([place]) Active Problems:   Acute lower UTI   GERD (gastroesophageal reflux disease)   Hiccups   Hyperlipidemia   SECONDARY DIAGNOSIS:   Past Medical History:  Diagnosis Date  . Decubitus ulcer   . Renal disorder   . Spina bifida [facility])     HOSPITAL COURSE:   1.  Clinical sepsis, present on admission with acute kidney injury.  The patient has a urostomy and this is a urinary source.  Providencia stuartii growing on the urine culture with resistance pattern.  Patient was initially on Maxipime and switched over to Rocephin.  Unfortunately I do not have an oral choice of antibiotic.  Of note, the patient did have sepsis with this back in 2020 of last year same organism.  Case discussed with pharmacist and infectious disease specialist and they recommended PICC line and 10 days of Rocephin.  Patient will receive another 8 days starting this evening.  Creatinine has improved from 1.62 down to 1.22.  White blood cell count has come down from 12.5 down to 7.2. 2.  Nausea.  In speaking with the patient the patient always has nausea and he takes his nausea medications and it improves.  The patient did have vomiting prior to coming into the hospital this has settled down. 3.  Hiccups.  I did give as needed Thorazine but this sometimes can recur for this patient.  As needed Thorazine as outpatient. 4.  GERD on Carafate and Protonix 5.   Hyperlipidemia on Crestor 6.  History of spina bifida with paralysis of the lower extremities 7.  Stool for C. difficile was negative.  PICC line care as per protocol.  At the end of the antibiotic course can remove PICC line.  Patient could not discharge as planned on 3/23.  Patient successfully discharged 12/26/2019.  DISCHARGE CONDITIONS:   Satisfactory  CONSULTS OBTAINED:  Case discussed with infectious disease.  DRUG ALLERGIES:   Allergies  Allergen Reactions  . Ivp Dye [Iodinated Diagnostic Agents] Hives  . Morphine And Related Nausea And Vomiting  . Sulfa Antibiotics Other (See Comments)    Reaction: unknown  . Latex Rash    DISCHARGE MEDICATIONS:   Allergies as of 12/25/2019      Reactions   Ivp Dye [iodinated Diagnostic Agents] Hives   Morphine And Related Nausea And Vomiting   Sulfa Antibiotics Other (See Comments)   Reaction: unknown   Latex Rash      Medication List    TAKE these medications   acetaminophen 325 MG tablet Commonly known as: TYLENOL Take 2 tablets (650 mg total) by mouth every 6 (six) hours as needed for mild pain (or Fever >/= 101).   cefTRIAXone 2 g in sodium chloride 0.9 % 100 mL Inject 2 g into the vein every evening.   CENTRUM MEN PO Take 1 tablet by mouth daily.   chlorproMAZINE 10 MG tablet Commonly known as: THORAZINE Take 1 tablet (10 mg total) by mouth 3 (three) times daily as  needed for hiccoughs.   metoCLOPramide 5 MG tablet Commonly known as: REGLAN Take 5 mg by mouth every 8 (eight) hours as needed for nausea or vomiting.   pantoprazole 40 MG tablet Commonly known as: PROTONIX Take 1 tablet (40 mg total) by mouth daily.   promethazine 12.5 MG tablet Commonly known as: PHENERGAN Take 1 tablet (12.5 mg total) by mouth every 6 (six) hours as needed for nausea or vomiting.   protein supplement shake Liqd Commonly known as: PREMIER PROTEIN Take 325 mLs (11 oz total) by mouth 2 (two) times daily between meals.    rosuvastatin 10 MG tablet Commonly known as: CRESTOR Take 10 mg by mouth at bedtime.   sucralfate 1 g tablet Commonly known as: CARAFATE Take 1 g by mouth 3 (three) times daily.        DISCHARGE INSTRUCTIONS:   Follow-up with team at rehab 1 day.  Will need Rocephin dose this evening.  If you experience worsening of your admission symptoms, develop shortness of breath, life threatening emergency, suicidal or homicidal thoughts you must seek medical attention immediately by calling 911 or calling your MD immediately  if symptoms less severe.  You Must read complete instructions/literature along with all the possible adverse reactions/side effects for all the Medicines you take and that have been prescribed to you. Take any new Medicines after you have completely understood and accept all the possible adverse reactions/side effects.   Please note  You were cared for by a hospitalist during your hospital stay. If you have any questions about your discharge medications or the care you received while you were in the hospital after you are discharged, you can call the unit and asked to speak with the hospitalist on call if the hospitalist that took care of you is not available. Once you are discharged, your primary care physician will handle any further medical issues. Please note that NO REFILLS for any discharge medications will be authorized once you are discharged, as it is imperative that you return to your primary care physician (or establish a relationship with a primary care physician if you do not have one) for your aftercare needs so that they can reassess your need for medications and monitor your lab values.    Today   CHIEF COMPLAINT:   Chief Complaint  Patient presents with  . Weakness    HISTORY OF PRESENT ILLNESS:  [name]  is a 54 y.o. male came in with weakness and found to have clinical sepsis.   VITAL SIGNS:  Blood pressure (!) 151/97, pulse (!) 106,  temperature 98.2 F (36.8 C), temperature source Oral, resp. rate 18, height 4\' 6"  (1.372 m), weight 61.2 kg, SpO2 98 %.  I/O:    Intake/Output Summary (Last 24 hours) at 12/25/2019 1121 Last data filed at 12/25/2019 0900 Gross per 24 hour  Intake 270 ml  Output 3420 ml  Net -3150 ml    PHYSICAL EXAMINATION:  GENERAL:  54 y.o.-year-old patient lying in the bed with no acute distress.  EYES: Pupils equal, round, reactive to light and accommodation. No scleral icterus. Extraocular muscles intact.  HEENT: Head atraumatic, normocephalic. Oropharynx and nasopharynx clear.  LUNGS: Normal breath sounds bilaterally, no wheezing, rales,rhonchi or crepitation. No use of accessory muscles of respiration.  CARDIOVASCULAR: S1, S2 normal. No murmurs, rubs, or gallops.  ABDOMEN: Soft, non-tender, non-distended. Bowel sounds present. No organomegaly or mass.  EXTREMITIES: No pedal edema, cyanosis, or clubbing.  NEUROLOGIC: Cranial nerves II through XII are  intact. Muscle strength 5/5 in all extremities.  Paralysis lower extremities PSYCHIATRIC: The patient is alert and oriented x 3.  SKIN: No obvious rash, lesion, or ulcer.   DATA REVIEW:   CBC Recent Labs  Lab 12/24/19 0646  WBC 7.2  HGB 12.6*  HCT 40.6  PLT 249    Chemistries  Recent Labs  Lab 12/22/19 1213 12/23/19 0727 12/24/19 0646  NA 139   < > 143  K 4.5   < > 3.4*  CL 102   < > 117*  CO2 21*   < > 17*  GLUCOSE 83   < > 99  BUN 25*   < > 16  CREATININE 1.62*   < > 1.22  CALCIUM 9.5   < > 9.0  AST 39  --   --   ALT 31  --   --   ALKPHOS 111  --   --   BILITOT 1.3*  --   --    < > = values in this interval not displayed.    Microbiology Results  Results for orders placed or performed during the hospital encounter of 12/22/19  Blood Culture (routine x 2)     Status: None (Preliminary result)   Collection Time: 12/22/19 12:13 PM   Specimen: BLOOD  Result Value Ref Range Status   Specimen Description BLOOD R ARM   Final   Special Requests   Final    BOTTLES DRAWN AEROBIC AND ANAEROBIC Blood Culture results may not be optimal due to an inadequate volume of blood received in culture bottles   Culture   Final    NO GROWTH 3 DAYS Performed at [facility], [address], [place], [place] [address]    Report Status PENDING  Incomplete  Urine culture     Status: Abnormal   Collection Time: 12/22/19 12:13 PM   Specimen: In/Out Cath Urine  Result Value Ref Range Status   Specimen Description IN/OUT CATH URINE  Final   Special Requests   Final    NONE Performed at [facility], [address] [place], [place] [address]    Culture >=100,000 COLONIES/mL PROVIDENCIA STUARTII (A)  Final   Report Status 12/24/2019 FINAL  Final   Organism ID, Bacteria PROVIDENCIA STUARTII (A)  Final      Susceptibility   Providencia stuartii - MIC*    AMPICILLIN >=32 RESISTANT Resistant     CEFAZOLIN >=64 RESISTANT Resistant     CEFTRIAXONE <=0.25 SENSITIVE Sensitive     CIPROFLOXACIN >=4 RESISTANT Resistant     GENTAMICIN RESISTANT Resistant     IMIPENEM 8 INTERMEDIATE Intermediate     NITROFURANTOIN 256 RESISTANT Resistant     TRIMETH/SULFA >=320 RESISTANT Resistant     AMPICILLIN/SULBACTAM >=32 RESISTANT Resistant     PIP/TAZO 8 SENSITIVE Sensitive     * >=100,000 COLONIES/mL PROVIDENCIA STUARTII  Respiratory Panel by RT PCR (Flu A&B, Covid) -     Status: None   Collection Time: 12/22/19 12:13 PM  Result Value Ref Range Status   SARS Coronavirus 2 by RT PCR NEGATIVE NEGATIVE Final    Comment: (NOTE) SARS-CoV-2 target nucleic acids are NOT DETECTED. The SARS-CoV-2 RNA is generally detectable in upper respiratoy specimens during the acute phase of infection. The lowest concentration of SARS-CoV-2 viral copies this assay can detect is 131 copies/mL. A negative result does not preclude SARS-Cov-2 infection and should not be used as the sole basis for treatment or other patient management  decisions. A  negative result may occur with  improper specimen collection/handling, submission of specimen other than nasopharyngeal swab, presence of viral mutation(s) within the areas targeted by this assay, and inadequate number of viral copies (<131 copies/mL). A negative result must be combined with clinical observations, patient history, and epidemiological information. The expected result is Negative. Fact Sheet for Patients:  PinkCheek.be Fact Sheet for Healthcare Providers:  GravelBags.it This test is not yet ap proved or cleared by the [place] FDA and  has been authorized for detection and/or diagnosis of SARS-CoV-2 by FDA under an Emergency Use Authorization (EUA). This EUA will remain  in effect (meaning this test can be used) for the duration of the COVID-19 declaration under Section 564(b)(1) of the Act, 21 U.S.C. section 360bbb-3(b)(1), unless the authorization is terminated or revoked sooner.    Influenza A by PCR NEGATIVE NEGATIVE Final   Influenza B by PCR NEGATIVE NEGATIVE Final    Comment: (NOTE) The Xpert Xpress SARS-CoV-2/FLU/RSV assay is intended as an aid in  the diagnosis of influenza from Nasopharyngeal swab specimens and  should not be used as a sole basis for treatment. Nasal washings and  aspirates are unacceptable for Xpert Xpress SARS-CoV-2/FLU/RSV  testing. Fact Sheet for Patients: PinkCheek.be Fact Sheet for Healthcare Providers: GravelBags.it This test is not yet approved or cleared by the [place] FDA and  has been authorized for detection and/or diagnosis of SARS-CoV-2 by  FDA under an Emergency Use Authorization (EUA). This EUA will remain  in effect (meaning this test can be used) for the duration of the  Covid-19 declaration under Section 564(b)(1) of the Act, 21  U.S.C. section 360bbb-3(b)(1), unless the authorization  is  terminated or revoked. Performed at [facility], [address] [place], [place] [address]   Blood Culture (routine x 2)     Status: None (Preliminary result)   Collection Time: 12/22/19 12:14 PM   Specimen: BLOOD  Result Value Ref Range Status   Specimen Description BLOOD RAC  Final   Special Requests   Final    BOTTLES DRAWN AEROBIC AND ANAEROBIC Blood Culture adequate volume   Culture   Final    NO GROWTH 3 DAYS Performed at [facility], [address] [place], [place] [address]    Report Status PENDING  Incomplete  C difficile quick scan w PCR reflex     Status: None   Collection Time: 12/23/19  1:49 PM   Specimen: STOOL  Result Value Ref Range Status   C Diff antigen NEGATIVE NEGATIVE Final   C Diff toxin NEGATIVE NEGATIVE Final   C Diff interpretation No C. difficile detected.  Final    Comment: Performed at [facility], [address] [place], [place] [address]    RADIOLOGY:  [place] EKG SITE RITE  Result Date: 12/24/2019 If [facility] image not attached, placement could not be confirmed due to current cardiac rhythm.     Management plans discussed with the patient, family and they are in agreement.  CODE STATUS:     Code Status Orders  (From admission, onward)         Start     Ordered   12/22/19 1448  Full code  Continuous     12/22/19 1449        Code Status History    Date Active Date Inactive Code Status Order ID Comments User Context   04/24/2019 0036 04/29/2019 1658 Full Code [id number]  Mansy, Arvella Merles, MD ED  11/02/2017 1730 11/05/2017 2040 Full Code [id number]  Demetrios Loll, MD Inpatient   10/10/2016 0205 10/11/2016 2124 Full Code [id number]  Harvie Bridge, DO Inpatient   06/03/2016 0429 06/08/2016 1914 Full Code [id number]  Saundra Shelling, MD Inpatient   04/21/2016 0238 04/26/2016 1815 Full Code [id number]  Harrie Foreman, MD Inpatient   Advance Care Planning Activity    Advance Directive Documentation     Most Recent  Value  Type of Advance Directive  Healthcare Power of Attorney  Pre-existing out of facility DNR order (yellow form or pink MOST form)  --  "MOST" Form in Place?  --      TOTAL TIME TAKING CARE OF THIS PATIENT: 35 minutes.    Loletha Grayer M.D on 12/25/2019 at 11:21 AM  Between 7am to 6pm - Pager - [phone]  After 6pm go to [email or web address] - password [email or web address]  [facility]  CC: Primary care physician; Idelle Crouch, MD

## 2019-12-25 NOTE — Care Management Important Message (Signed)
Important Message  Patient Details  Name: [name] MRN: [id number] Date of Birth: [birth year]   Medicare Important Message Given:  Yes     Dannette Barbara 12/25/2019, 11:07 AM

## 2019-12-25 NOTE — TOC Progression Note (Signed)
Transition of Care [facility]) - Progression Note    Patient Details  Name: [name] MRN: [id number] Date of Birth: 01-20-1966  Transition of Care [facility]) CM/SW [place], LCSW Phone Number: 12/25/2019, 10:04 AM  Clinical Narrative: Met with patient to notify him that [facility] is able to accept him. CSW asked if his sister was his legal guardian but he said it was never officially put in place. No documented legal guardian paperwork in the chart. CSW left her a voicemail. Patient said he told her last night that he might end up going there. AC will order a rapid COVID test. Patient is supposed to get PICC placed today.     Expected Discharge Plan: [place] Barriers to Discharge: Continued Medical Work up  Expected Discharge Plan and Services Expected Discharge Plan: [place]   Discharge Planning Services: CM Consult Post Acute Care Choice: [place] Living arrangements for the past 2 months: Single Family Home                                       Social Determinants of Health (SDOH) Interventions    Readmission Risk Interventions No flowsheet data found.

## 2019-12-25 NOTE — Discharge Instructions (Signed)

## 2019-12-26 NOTE — TOC Progression Note (Signed)
Transition of Care [facility]) - Progression Note    Patient Details  Name: Derrol Sharman MRN: [id number] Date of Birth: [birth year]  Transition of Care [facility]) CM/SW [place], LCSW Phone Number: 12/26/2019, 9:01 AM  Clinical Narrative: [facility] patient. Sent secure chat to MD to let him know they just need discharge date updated to today before transport is set up.    Expected Discharge Plan: Skilled Nursing Facility Barriers to Discharge: Barriers Resolved  Expected Discharge Plan and Services Expected Discharge Plan: [place]   Discharge Planning Services: CM Consult Post Acute Care Choice: [place] Living arrangements for the past 2 months: Single Family Home Expected Discharge Date: 12/25/19                                     Social Determinants of Health (SDOH) Interventions    Readmission Risk Interventions No flowsheet data found.

## 2019-12-26 NOTE — Progress Notes (Signed)
Report called to Vicente Males at [facility]

## 2019-12-26 NOTE — TOC Transition Note (Signed)
Transition of Care [facility]) - CM/SW Discharge Note   Patient Details  Name: [name] MRN: [id number] Date of Birth: 02-16-1966  Transition of Care [facility]) CM/SW Contact:  Candie Chroman, LCSW Phone Number: 12/26/2019, 9:31 AM   Clinical Narrative: Patient has orders to discharge to [facility] today. RN will call report to [phone] (Room 508). EMS transport set up for 10:00. Patient and sister aware. No further concerns. CSW signing off.    Final next level of care: [place] Barriers to Discharge: Barriers Resolved   Patient Goals and CMS Choice     Choice offered to / list presented to : Patient, Sibling  Discharge Placement   Existing PASRR number confirmed : 12/24/19          Patient chooses bed at: [facility] Patient to be transferred to facility by: EMS Name of family member notified: [name] Patient and family notified of of transfer: 12/26/19  Discharge Plan and Services   Discharge Planning Services: CM Consult Post Acute Care Choice: [place]                               Social Determinants of Health (SDOH) Interventions     Readmission Risk Interventions No flowsheet data found.

## 2019-12-26 NOTE — Progress Notes (Signed)
Pt discharged per MD order. Discharge packet sent with pt via EMS to [facility].

## 2019-12-27 LAB — CULTURE, BLOOD (ROUTINE X 2)
Culture: NO GROWTH
Culture: NO GROWTH
Special Requests: ADEQUATE

## 2020-03-04 DIAGNOSIS — Q059 Spina bifida, unspecified: Secondary | ICD-10-CM | POA: Diagnosis not present

## 2020-03-06 DIAGNOSIS — Q059 Spina bifida, unspecified: Secondary | ICD-10-CM | POA: Diagnosis not present

## 2020-03-06 DIAGNOSIS — C679 Malignant neoplasm of bladder, unspecified: Secondary | ICD-10-CM | POA: Diagnosis not present

## 2020-03-06 DIAGNOSIS — Z936 Other artificial openings of urinary tract status: Secondary | ICD-10-CM | POA: Diagnosis not present

## 2020-03-12 DIAGNOSIS — C679 Malignant neoplasm of bladder, unspecified: Secondary | ICD-10-CM | POA: Diagnosis not present

## 2020-03-12 DIAGNOSIS — Z936 Other artificial openings of urinary tract status: Secondary | ICD-10-CM | POA: Diagnosis not present

## 2020-03-12 DIAGNOSIS — Q059 Spina bifida, unspecified: Secondary | ICD-10-CM | POA: Diagnosis not present

## 2020-04-03 DIAGNOSIS — Q059 Spina bifida, unspecified: Secondary | ICD-10-CM | POA: Diagnosis not present

## 2020-04-21 DIAGNOSIS — Q059 Spina bifida, unspecified: Secondary | ICD-10-CM | POA: Diagnosis not present

## 2020-04-30 DIAGNOSIS — Z936 Other artificial openings of urinary tract status: Secondary | ICD-10-CM | POA: Diagnosis not present

## 2020-04-30 DIAGNOSIS — Q059 Spina bifida, unspecified: Secondary | ICD-10-CM | POA: Diagnosis not present

## 2020-04-30 DIAGNOSIS — C679 Malignant neoplasm of bladder, unspecified: Secondary | ICD-10-CM | POA: Diagnosis not present

## 2020-05-05 DIAGNOSIS — Q059 Spina bifida, unspecified: Secondary | ICD-10-CM | POA: Diagnosis not present

## 2020-05-29 DIAGNOSIS — Q059 Spina bifida, unspecified: Secondary | ICD-10-CM | POA: Diagnosis not present

## 2020-05-29 DIAGNOSIS — Z936 Other artificial openings of urinary tract status: Secondary | ICD-10-CM | POA: Diagnosis not present

## 2020-05-29 DIAGNOSIS — C679 Malignant neoplasm of bladder, unspecified: Secondary | ICD-10-CM | POA: Diagnosis not present

## 2020-06-26 ENCOUNTER — Other Ambulatory Visit: Payer: Self-pay

## 2020-06-26 DIAGNOSIS — I129 Hypertensive chronic kidney disease with stage 1 through stage 4 chronic kidney disease, or unspecified chronic kidney disease: Secondary | ICD-10-CM | POA: Diagnosis present

## 2020-06-26 DIAGNOSIS — E785 Hyperlipidemia, unspecified: Secondary | ICD-10-CM | POA: Diagnosis present

## 2020-06-26 DIAGNOSIS — Z87442 Personal history of urinary calculi: Secondary | ICD-10-CM

## 2020-06-26 DIAGNOSIS — Z8719 Personal history of other diseases of the digestive system: Secondary | ICD-10-CM

## 2020-06-26 DIAGNOSIS — E876 Hypokalemia: Secondary | ICD-10-CM | POA: Diagnosis present

## 2020-06-26 DIAGNOSIS — K219 Gastro-esophageal reflux disease without esophagitis: Secondary | ICD-10-CM | POA: Diagnosis present

## 2020-06-26 DIAGNOSIS — Z792 Long term (current) use of antibiotics: Secondary | ICD-10-CM

## 2020-06-26 DIAGNOSIS — N136 Pyonephrosis: Secondary | ICD-10-CM | POA: Diagnosis present

## 2020-06-26 DIAGNOSIS — Z79899 Other long term (current) drug therapy: Secondary | ICD-10-CM

## 2020-06-26 DIAGNOSIS — R1084 Generalized abdominal pain: Secondary | ICD-10-CM | POA: Diagnosis not present

## 2020-06-26 DIAGNOSIS — Z933 Colostomy status: Secondary | ICD-10-CM

## 2020-06-26 DIAGNOSIS — R5381 Other malaise: Secondary | ICD-10-CM | POA: Diagnosis not present

## 2020-06-26 DIAGNOSIS — Z882 Allergy status to sulfonamides status: Secondary | ICD-10-CM

## 2020-06-26 DIAGNOSIS — Z20822 Contact with and (suspected) exposure to covid-19: Secondary | ICD-10-CM | POA: Diagnosis present

## 2020-06-26 DIAGNOSIS — A419 Sepsis, unspecified organism: Principal | ICD-10-CM | POA: Diagnosis present

## 2020-06-26 DIAGNOSIS — R52 Pain, unspecified: Secondary | ICD-10-CM | POA: Diagnosis not present

## 2020-06-26 DIAGNOSIS — N1831 Chronic kidney disease, stage 3a: Secondary | ICD-10-CM | POA: Diagnosis present

## 2020-06-26 DIAGNOSIS — Z885 Allergy status to narcotic agent status: Secondary | ICD-10-CM

## 2020-06-26 DIAGNOSIS — Q059 Spina bifida, unspecified: Secondary | ICD-10-CM

## 2020-06-26 DIAGNOSIS — N1330 Unspecified hydronephrosis: Secondary | ICD-10-CM | POA: Diagnosis not present

## 2020-06-26 DIAGNOSIS — Z91040 Latex allergy status: Secondary | ICD-10-CM

## 2020-06-26 DIAGNOSIS — Z91041 Radiographic dye allergy status: Secondary | ICD-10-CM

## 2020-06-26 DIAGNOSIS — N319 Neuromuscular dysfunction of bladder, unspecified: Secondary | ICD-10-CM | POA: Diagnosis present

## 2020-06-26 DIAGNOSIS — N390 Urinary tract infection, site not specified: Secondary | ICD-10-CM | POA: Diagnosis not present

## 2020-06-26 DIAGNOSIS — Z936 Other artificial openings of urinary tract status: Secondary | ICD-10-CM

## 2020-06-26 DIAGNOSIS — N179 Acute kidney failure, unspecified: Secondary | ICD-10-CM | POA: Diagnosis present

## 2020-06-26 DIAGNOSIS — Z87440 Personal history of urinary (tract) infections: Secondary | ICD-10-CM

## 2020-06-26 DIAGNOSIS — R319 Hematuria, unspecified: Secondary | ICD-10-CM | POA: Diagnosis not present

## 2020-06-26 LAB — COMPREHENSIVE METABOLIC PANEL
ALT: 20 U/L (ref 0–44)
AST: 26 U/L (ref 15–41)
Albumin: 4 g/dL (ref 3.5–5.0)
Alkaline Phosphatase: 75 U/L (ref 38–126)
Anion gap: 15 (ref 5–15)
BUN: 39 mg/dL — ABNORMAL HIGH (ref 6–20)
CO2: 24 mmol/L (ref 22–32)
Calcium: 9.4 mg/dL (ref 8.9–10.3)
Chloride: 98 mmol/L (ref 98–111)
Creatinine, Ser: 1.8 mg/dL — ABNORMAL HIGH (ref 0.61–1.24)
GFR calc Af Amer: 49 mL/min — ABNORMAL LOW (ref >60)
GFR calc non Af Amer: 42 mL/min — ABNORMAL LOW (ref >60)
Glucose, Bld: 120 mg/dL — ABNORMAL HIGH (ref 70–99)
Potassium: 3.4 mmol/L — ABNORMAL LOW (ref 3.5–5.1)
Sodium: 137 mmol/L (ref 135–145)
Total Bilirubin: 0.7 mg/dL (ref 0.3–1.2)
Total Protein: 7.9 g/dL (ref 6.5–8.1)

## 2020-06-26 LAB — CBC
HCT: 43.8 % (ref 39.0–52.0)
Hemoglobin: 14.4 g/dL (ref 13.0–17.0)
MCH: 27.9 pg (ref 26.0–34.0)
MCHC: 32.9 g/dL (ref 30.0–36.0)
MCV: 84.7 fL (ref 80.0–100.0)
Platelets: 295 10*3/uL (ref 150–400)
RBC: 5.17 MIL/uL (ref 4.22–5.81)
RDW: 14.9 % (ref 11.5–15.5)
WBC: 18.9 10*3/uL — ABNORMAL HIGH (ref 4.0–10.5)
nRBC: 0 % (ref 0.0–0.2)

## 2020-06-26 LAB — LACTIC ACID, PLASMA: Lactic Acid, Venous: 1.5 mmol/L (ref 0.5–1.9)

## 2020-06-26 LAB — LIPASE, BLOOD: Lipase: 44 U/L (ref 11–51)

## 2020-06-26 MED ORDER — ACETAMINOPHEN 325 MG PO TABS
650.0000 mg | ORAL_TABLET | Freq: Once | ORAL | Status: AC
  Administered 2020-06-26: 650 mg via ORAL
  Filled 2020-06-26: qty 2

## 2020-06-26 NOTE — ED Notes (Signed)
1 set of [facility] sent to lab

## 2020-06-26 NOTE — ED Triage Notes (Signed)
Pt brought in by EMS from home with co pain around colostomy bag. Pt has hx of spina bifida has colostomy and urostomy. States has had pain around stoma today but now it is more generalized to abd. States did have large amount of stool in colostomy bag, pt does co nausea. States did have a fever today, no cold symptoms or cough.

## 2020-06-27 ENCOUNTER — Emergency Department: Payer: Medicare HMO

## 2020-06-27 ENCOUNTER — Inpatient Hospital Stay
Admission: EM | Admit: 2020-06-27 | Discharge: 2020-06-30 | DRG: 872 | Disposition: A | Discharge status: Home / Self Care | Payer: Medicare HMO | Attending: Internal Medicine | Admitting: Internal Medicine

## 2020-06-27 DIAGNOSIS — N1831 Chronic kidney disease, stage 3a: Secondary | ICD-10-CM | POA: Diagnosis not present

## 2020-06-27 DIAGNOSIS — N390 Urinary tract infection, site not specified: Secondary | ICD-10-CM | POA: Diagnosis present

## 2020-06-27 DIAGNOSIS — E876 Hypokalemia: Secondary | ICD-10-CM

## 2020-06-27 DIAGNOSIS — Z91040 Latex allergy status: Secondary | ICD-10-CM | POA: Diagnosis not present

## 2020-06-27 DIAGNOSIS — Z936 Other artificial openings of urinary tract status: Secondary | ICD-10-CM | POA: Diagnosis not present

## 2020-06-27 DIAGNOSIS — N200 Calculus of kidney: Secondary | ICD-10-CM

## 2020-06-27 DIAGNOSIS — N132 Hydronephrosis with renal and ureteral calculous obstruction: Secondary | ICD-10-CM | POA: Diagnosis not present

## 2020-06-27 DIAGNOSIS — Z87442 Personal history of urinary calculi: Secondary | ICD-10-CM | POA: Diagnosis not present

## 2020-06-27 DIAGNOSIS — I10 Essential (primary) hypertension: Secondary | ICD-10-CM

## 2020-06-27 DIAGNOSIS — A419 Sepsis, unspecified organism: Principal | ICD-10-CM

## 2020-06-27 DIAGNOSIS — Z8719 Personal history of other diseases of the digestive system: Secondary | ICD-10-CM | POA: Diagnosis not present

## 2020-06-27 DIAGNOSIS — Z882 Allergy status to sulfonamides status: Secondary | ICD-10-CM | POA: Diagnosis not present

## 2020-06-27 DIAGNOSIS — Z20822 Contact with and (suspected) exposure to covid-19: Secondary | ICD-10-CM | POA: Diagnosis not present

## 2020-06-27 DIAGNOSIS — Z792 Long term (current) use of antibiotics: Secondary | ICD-10-CM | POA: Diagnosis not present

## 2020-06-27 DIAGNOSIS — K219 Gastro-esophageal reflux disease without esophagitis: Secondary | ICD-10-CM

## 2020-06-27 DIAGNOSIS — Z79899 Other long term (current) drug therapy: Secondary | ICD-10-CM | POA: Diagnosis not present

## 2020-06-27 DIAGNOSIS — N1330 Unspecified hydronephrosis: Secondary | ICD-10-CM | POA: Diagnosis present

## 2020-06-27 DIAGNOSIS — N319 Neuromuscular dysfunction of bladder, unspecified: Secondary | ICD-10-CM | POA: Diagnosis present

## 2020-06-27 DIAGNOSIS — N178 Other acute kidney failure: Secondary | ICD-10-CM | POA: Diagnosis not present

## 2020-06-27 DIAGNOSIS — Z933 Colostomy status: Secondary | ICD-10-CM | POA: Diagnosis not present

## 2020-06-27 DIAGNOSIS — N179 Acute kidney failure, unspecified: Secondary | ICD-10-CM

## 2020-06-27 DIAGNOSIS — N3000 Acute cystitis without hematuria: Secondary | ICD-10-CM | POA: Diagnosis not present

## 2020-06-27 DIAGNOSIS — Z87440 Personal history of urinary (tract) infections: Secondary | ICD-10-CM | POA: Diagnosis not present

## 2020-06-27 DIAGNOSIS — Z91041 Radiographic dye allergy status: Secondary | ICD-10-CM | POA: Diagnosis not present

## 2020-06-27 DIAGNOSIS — N201 Calculus of ureter: Secondary | ICD-10-CM

## 2020-06-27 DIAGNOSIS — Q059 Spina bifida, unspecified: Secondary | ICD-10-CM | POA: Diagnosis not present

## 2020-06-27 DIAGNOSIS — I129 Hypertensive chronic kidney disease with stage 1 through stage 4 chronic kidney disease, or unspecified chronic kidney disease: Secondary | ICD-10-CM | POA: Diagnosis not present

## 2020-06-27 DIAGNOSIS — N189 Chronic kidney disease, unspecified: Secondary | ICD-10-CM | POA: Diagnosis present

## 2020-06-27 DIAGNOSIS — Z885 Allergy status to narcotic agent status: Secondary | ICD-10-CM | POA: Diagnosis not present

## 2020-06-27 DIAGNOSIS — E785 Hyperlipidemia, unspecified: Secondary | ICD-10-CM | POA: Diagnosis not present

## 2020-06-27 DIAGNOSIS — N136 Pyonephrosis: Secondary | ICD-10-CM | POA: Diagnosis not present

## 2020-06-27 HISTORY — DX: Calculus of ureter: N20.1

## 2020-06-27 HISTORY — DX: Essential (primary) hypertension: I10

## 2020-06-27 HISTORY — DX: Acute kidney failure, unspecified: N17.9

## 2020-06-27 LAB — PROTIME-INR
INR: 1 (ref 0.8–1.2)
Prothrombin Time: 12.8 seconds (ref 11.4–15.2)

## 2020-06-27 LAB — URINALYSIS, COMPLETE (UACMP) WITH MICROSCOPIC
Bilirubin Urine: NEGATIVE
Glucose, UA: NEGATIVE mg/dL
Ketones, ur: NEGATIVE mg/dL
Nitrite: POSITIVE — AB
Protein, ur: 30 mg/dL — AB
Specific Gravity, Urine: 1.009 (ref 1.005–1.030)
WBC, UA: >50 WBC/hpf — ABNORMAL HIGH (ref 0–5)
pH: 8 (ref 5.0–8.0)

## 2020-06-27 LAB — RESPIRATORY PANEL BY RT PCR (FLU A&B, COVID)
Influenza A by PCR: NEGATIVE
Influenza B by PCR: NEGATIVE
SARS Coronavirus 2 by RT PCR: NEGATIVE

## 2020-06-27 LAB — APTT: aPTT: 34 seconds (ref 24–36)

## 2020-06-27 LAB — PROCALCITONIN: Procalcitonin: 1.64 ng/mL

## 2020-06-27 MED ORDER — LACTATED RINGERS IV BOLUS (SEPSIS)
1000.0000 mL | Freq: Once | INTRAVENOUS | Status: AC
  Administered 2020-06-27: 1000 mL via INTRAVENOUS

## 2020-06-27 MED ORDER — LACTATED RINGERS IV SOLN: INTRAVENOUS | Status: AC

## 2020-06-27 MED ORDER — ONDANSETRON HCL 4 MG/2ML IJ SOLN: 4.0000 mg | Freq: Three times a day (TID) | INTRAMUSCULAR | Status: DC | PRN

## 2020-06-27 MED ORDER — PANTOPRAZOLE SODIUM 40 MG PO TBEC
40.0000 mg | DELAYED_RELEASE_TABLET | Freq: Every day | ORAL | Status: DC
  Administered 2020-06-28 – 2020-06-30 (×3): 40 mg via ORAL
  Filled 2020-06-27 (×3): qty 1

## 2020-06-27 MED ORDER — POTASSIUM CHLORIDE CRYS ER 20 MEQ PO TBCR: 30.0000 meq | EXTENDED_RELEASE_TABLET | Freq: Once | ORAL | Status: DC

## 2020-06-27 MED ORDER — DICYCLOMINE HCL 10 MG PO CAPS
10.0000 mg | ORAL_CAPSULE | Freq: Three times a day (TID) | ORAL | Status: DC | PRN
  Filled 2020-06-27: qty 1

## 2020-06-27 MED ORDER — ADULT MULTIVITAMIN W/MINERALS CH
1.0000 | ORAL_TABLET | Freq: Every day | ORAL | Status: DC
  Administered 2020-06-28 – 2020-06-30 (×3): 1 via ORAL
  Filled 2020-06-27 (×3): qty 1

## 2020-06-27 MED ORDER — SODIUM CHLORIDE 0.9 % IV SOLN: 2.0000 g | Freq: Once | INTRAVENOUS | Status: DC

## 2020-06-27 MED ORDER — HEPARIN SODIUM (PORCINE) 5000 UNIT/ML IJ SOLN
5000.0000 [IU] | Freq: Three times a day (TID) | INTRAMUSCULAR | Status: DC
  Administered 2020-06-28 – 2020-06-29 (×4): 5000 [IU] via SUBCUTANEOUS
  Filled 2020-06-27 (×5): qty 1

## 2020-06-27 MED ORDER — ACETAMINOPHEN 325 MG PO TABS: 650.0000 mg | ORAL_TABLET | Freq: Four times a day (QID) | ORAL | Status: DC | PRN

## 2020-06-27 MED ORDER — SODIUM CHLORIDE 0.9 % IV SOLN
1.0000 g | Freq: Once | INTRAVENOUS | Status: DC
  Filled 2020-06-27: qty 10

## 2020-06-27 MED ORDER — ROSUVASTATIN CALCIUM 10 MG PO TABS
10.0000 mg | ORAL_TABLET | Freq: Every day | ORAL | Status: DC
  Administered 2020-06-28 – 2020-06-29 (×2): 10 mg via ORAL
  Filled 2020-06-27 (×4): qty 1

## 2020-06-27 MED ORDER — SUCRALFATE 1 G PO TABS
1.0000 g | ORAL_TABLET | Freq: Three times a day (TID) | ORAL | Status: DC
  Administered 2020-06-28 – 2020-06-30 (×8): 1 g via ORAL
  Filled 2020-06-27 (×8): qty 1

## 2020-06-27 MED ORDER — FENTANYL CITRATE (PF) 100 MCG/2ML IJ SOLN: 12.5000 ug | INTRAMUSCULAR | Status: DC | PRN

## 2020-06-27 MED ORDER — SODIUM CHLORIDE 0.9 % IV SOLN
2.0000 g | Freq: Two times a day (BID) | INTRAVENOUS | Status: DC
  Administered 2020-06-27 – 2020-06-29 (×6): 2 g via INTRAVENOUS
  Filled 2020-06-27 (×8): qty 2

## 2020-06-27 NOTE — Progress Notes (Signed)
CODE SEPSIS - PHARMACY COMMUNICATION  **[facility] Spectrum Antibiotics should be administered within 1 hour of Sepsis diagnosis**  Time Code Sepsis Called/Page Received: @1325   Antibiotics Ordered: cefepime  Time of 1st antibiotic administration: @ 1520  Additional action taken by pharmacy: @1356  secure messaged Arlis Porta, RN  about timely administration of antibiotics. RN informed pharmacy that patient was a "hard stick" and that they were working on getting IV acces. He notified me once medication was started      Rowland Lathe ,PharmD Clinical Pharmacist  06/27/2020  1:43 PM

## 2020-06-27 NOTE — H&P (Signed)
History and Physical    [name] YHC:[id number] DOB: 07-06-1966 DOA: 06/27/2020  Referring MD/NP/PA:   PCP: Idelle Crouch, MD   Patient coming from:  The patient is coming from home.  At baseline, pt is independent for most of ADL.        Chief Complaint: Abdominal pain  HPI: [name] is a 54 y.o. male with medical history significant of spina bifida, neurogenic bladder requiring ileal conduit urinary diversion, status post recent conduit revision, chronic bilateral hydronephrosis and bilateral nephrolithiasis status post recent PCNL at [facility], s/of colostomy, hypertension, hyperlipidemia, GERD, CKD-3, esophageal ulcer with bleeding, who presents with abdominal pain.  Patient states that his abdominal pain started yesterday, associated with nausea, no vomiting or diarrhea.  Patient also has fever and chills.  His temperature is 101.1 in the ED.  No flank pain.  No hematuria.  His abdominal pain is around urostomy and colostomy, constant, moderate, nonradiating.  No chest pain, shortness breath, cough.  No facial droop or slurred speech.  ED Course: pt was found to have WBC 18.9, lactic acid of 1.5, positive urinalysis (cloudy appearance, large amount of leukocyte, positive nitrite, many bacteria, WBC> 50), worsening renal function, potassium 3.4, temperature one 1.1, blood pressure 133/92, heart rate 102, RR 16, oxygen saturation 93 to 98% on room air. CT scan today that revealed chronic bilateral hydronephrosis along with bilateral nephrolithiasis with a small stone fragment in the left UPJ. Pt is admitted to [place] bed as an inpatient.  Urology, Dr. Lovena Neighbours and IR, Dr. Anselm Pancoast are consulted.   Review of Systems:   General: has fevers, chills, no body weight gain, has poor appetite, has fatigue HEENT: no blurry vision, hearing changes or sore throat Respiratory: no dyspnea, coughing, wheezing CV: no chest pain, no palpitations GI: has nausea, abdominal pain, no diarrhea,  constipation, vomiting, GU: no dysuria, burning on urination, increased urinary frequency, hematuria  Ext: no leg edema Neuro: no unilateral weakness, numbness, or tingling, no vision change or hearing loss Skin: no rash, no skin tear. MSK: No muscle spasm, no deformity, no limitation of range of movement in spin Heme: No easy bruising.  Travel history: No recent long distant travel.  Allergy:  Allergies  Allergen Reactions  . Morphine And Related Nausea And Vomiting  . Sulfa Antibiotics Other (See Comments)    Reaction: unknown  . Ivp Dye [Iodinated Diagnostic Agents] Hives  . Latex Rash    Past Medical History:  Diagnosis Date  . Decubitus ulcer   . Renal disorder   . Spina bifida [facility])     Past Surgical History:  Procedure Laterality Date  . ABDOMINAL SURGERY    . BACK SURGERY    . COLON SURGERY    . ESOPHAGOGASTRODUODENOSCOPY (EGD) WITH PROPOFOL N/A 06/03/2016   Procedure: ESOPHAGOGASTRODUODENOSCOPY (EGD) WITH PROPOFOL;  Surgeon: [name], MD;  Location: [facility] ENDOSCOPY;  Service: Endoscopy;  Laterality: N/A;  . ESOPHAGOGASTRODUODENOSCOPY (EGD) WITH PROPOFOL N/A 04/27/2019   Procedure: ESOPHAGOGASTRODUODENOSCOPY (EGD) WITH PROPOFOL;  Surgeon: Jonathon Bellows, MD;  Location[facility] ENDOSCOPY;  Service: Gastroenterology;  Laterality: N/A;  . ilieostomy    . VENTRICULOPERITONEAL SHUNT    . vp shunt removal      Social History:  reports that he has never smoked. He has never used smokeless tobacco. He reports that he does not drink alcohol and does not use drugs.  Family History:  Family History  Problem Relation Age of Onset  . Diabetes Mellitus II Father  Prior to Admission medications   Medication Sig Start Date End Date Taking? Authorizing Provider  dicyclomine (BENTYL) 10 MG capsule Take 10 mg by mouth 3 (three) times daily as needed for spasms. 02/28/20  Yes [provider]  hydrochlorothiazide (HYDRODIURIL) 25 MG tablet Take 25 mg by mouth daily as  needed. 06/19/20  Yes [provider]  Multiple Vitamins-Minerals (CENTRUM MEN PO) Take 1 tablet by mouth daily.    Yes [provider]  pantoprazole (PROTONIX) 40 MG tablet Take 1 tablet (40 mg total) by mouth daily. 04/29/19 06/27/20 Yes Sainani, Belia Heman, MD  potassium chloride SA (KLOR-CON) 20 MEQ tablet Take 20 mEq by mouth daily. 05/28/20  Yes [provider]  rosuvastatin (CRESTOR) 10 MG tablet Take 10 mg by mouth at bedtime. 10/20/19  Yes [provider]  sucralfate (CARAFATE) 1 g tablet Take 1 g by mouth 3 (three) times daily. 12/14/19  Yes [provider]  acetaminophen (TYLENOL) 325 MG tablet Take 2 tablets (650 mg total) by mouth every 6 (six) hours as needed for mild pain (or Fever >/= 101). 12/25/19   Loletha Grayer, MD  metoCLOPramide (REGLAN) 5 MG tablet Take 5 mg by mouth every 8 (eight) hours as needed for nausea or vomiting.  10/24/19   [provider]    Physical Exam: Vitals:   06/27/20 1600 06/27/20 1800 06/27/20 1830 06/27/20 1900  BP: 109/80 112/76 111/82 133/84  Pulse: 97 99 98 (!) 113  Resp: 18     Temp:      TempSrc:      SpO2: 94% 94% 95% 94%  Weight:      Height:       General: Not in acute distress HEENT:       Eyes: PERRL, EOMI, no scleral icterus.       ENT: No discharge from the ears and nose, no pharynx injection, no tonsillar enlargement.        Neck: No JVD, no bruit, no mass felt. Heme: No neck lymph node enlargement. Cardiac: S1/S2, RRR, No murmurs, No gallops or rubs. Respiratory: No rales, wheezing, rhonchi or rubs. GI: Soft, nondistended, has central abdomen tendernes, no rebound pain, no organomegaly, BS present. S/p of urostomy and colostomy GU: No hematuria Ext: No pitting leg edema bilaterally. 2+DP/PT pulse bilaterally. Musculoskeletal: No joint deformities, No joint redness or warmth, no limitation of ROM in spin. Skin: No rashes.  Neuro: Alert, oriented X3, cranial nerves II-XII grossly  intact, moves all extremities normally.  Psych: Patient is not psychotic, no suicidal or hemocidal ideation.  Labs on Admission: I have personally reviewed following labs and imaging studies  CBC: Recent Labs  Lab 06/26/20 2315  WBC 18.9*  HGB 14.4  HCT 43.8  MCV 84.7  PLT 474   Basic Metabolic Panel: Recent Labs  Lab 06/26/20 2315  NA 137  K 3.4*  CL 98  CO2 24  GLUCOSE 120*  BUN 39*  CREATININE 1.80*  CALCIUM 9.4   GFR: Estimated Creatinine Clearance: 32.6 mL/min (A) (by C-G formula based on SCr of 1.8 mg/dL (H)). Liver Function Tests: Recent Labs  Lab 06/26/20 2315  AST 26  ALT 20  ALKPHOS 75  BILITOT 0.7  PROT 7.9  ALBUMIN 4.0   Recent Labs  Lab 06/26/20 2315  LIPASE 44   No results for input(s): AMMONIA in the last 168 hours. Coagulation Profile: No results for input(s): INR, PROTIME in the last 168 hours. Cardiac Enzymes: No results for input(s): CKTOTAL,  CKMB, CKMBINDEX, TROPONINI in the last 168 hours. BNP (last 3 results) No results for input(s): PROBNP in the last 8760 hours. HbA1C: No results for input(s): HGBA1C in the last 72 hours. CBG: No results for input(s): GLUCAP in the last 168 hours. Lipid Profile: No results for input(s): CHOL, HDL, LDLCALC, TRIG, CHOLHDL, LDLDIRECT in the last 72 hours. Thyroid Function Tests: No results for input(s): TSH, T4TOTAL, FREET4, T3FREE, THYROIDAB in the last 72 hours. Anemia Panel: No results for input(s): VITAMINB12, FOLATE, FERRITIN, TIBC, IRON, RETICCTPCT in the last 72 hours. Urine analysis:    Component Value Date/Time   COLORURINE YELLOW (A) 06/27/2020 0047   APPEARANCEUR CLOUDY (A) 06/27/2020 0047   APPEARANCEUR Cloudy (A) 03/21/2019 1130   LABSPEC 1.009 06/27/2020 0047   PHURINE 8.0 06/27/2020 0047   GLUCOSEU NEGATIVE 06/27/2020 0047   HGBUR MODERATE (A) 06/27/2020 0047   BILIRUBINUR NEGATIVE 06/27/2020 0047   BILIRUBINUR Negative 03/21/2019 [place] 06/27/2020 0047    PROTEINUR 30 (A) 06/27/2020 0047   NITRITE POSITIVE (A) 06/27/2020 0047   LEUKOCYTESUR LARGE (A) 06/27/2020 0047   Sepsis Labs: @LABRCNTIP (procalcitonin:4,lacticidven:4) ) Recent Results (from the past 240 hour(s))  Respiratory Panel by RT PCR (Flu A&B, Covid) - Nasopharyngeal Swab     Status: None   Collection Time: 06/26/20 11:16 PM   Specimen: Nasopharyngeal Swab  Result Value Ref Range Status   SARS Coronavirus 2 by RT PCR NEGATIVE NEGATIVE Final    Comment: (NOTE) SARS-CoV-2 target nucleic acids are NOT DETECTED.  The SARS-CoV-2 RNA is generally detectable in upper respiratoy specimens during the acute phase of infection. The lowest concentration of SARS-CoV-2 viral copies this assay can detect is 131 copies/mL. A negative result does not preclude SARS-Cov-2 infection and should not be used as the sole basis for treatment or other patient management decisions. A negative result may occur with  improper specimen collection/handling, submission of specimen other than nasopharyngeal swab, presence of viral mutation(s) within the areas targeted by this assay, and inadequate number of viral copies (<131 copies/mL). A negative result must be combined with clinical observations, patient history, and epidemiological information. The expected result is Negative.  Fact Sheet for Patients:  PinkCheek.be  Fact Sheet for Healthcare Providers:  GravelBags.it  This test is no t yet approved or cleared by the [place] FDA and  has been authorized for detection and/or diagnosis of SARS-CoV-2 by FDA under an Emergency Use Authorization (EUA). This EUA will remain  in effect (meaning this test can be used) for the duration of the COVID-19 declaration under Section 564(b)(1) of the Act, 21 U.S.C. section 360bbb-3(b)(1), unless the authorization is terminated or revoked sooner.     Influenza A by PCR NEGATIVE NEGATIVE Final    Influenza B by PCR NEGATIVE NEGATIVE Final    Comment: (NOTE) The Xpert Xpress SARS-CoV-2/FLU/RSV assay is intended as an aid in  the diagnosis of influenza from Nasopharyngeal swab specimens and  should not be used as a sole basis for treatment. Nasal washings and  aspirates are unacceptable for Xpert Xpress SARS-CoV-2/FLU/RSV  testing.  Fact Sheet for Patients: PinkCheek.be  Fact Sheet for Healthcare Providers: GravelBags.it  This test is not yet approved or cleared by the [place] FDA and  has been authorized for detection and/or diagnosis of SARS-CoV-2 by  FDA under an Emergency Use Authorization (EUA). This EUA will remain  in effect (meaning this test can be used) for the duration of the  Covid-19 declaration under Section 564(b)(1)  of the Act, 21  U.S.C. section 360bbb-3(b)(1), unless the authorization is  terminated or revoked. Performed at [facility], [address] [place], [place] [address]      Radiological Exams on Admission: CT ABDOMEN PELVIS WO CONTRAST  Addendum Date: 06/27/2020   ADDENDUM REPORT: 06/27/2020 15:31 ADDENDUM: Note that while the calculus in the proximal left ureter has not been seen previously, the degree of hydronephrosis on the left remains chronic. The apparent effusion in the left and to a lesser extent right hip joints also appear chronic and grossly stable with respect to thickening in these areas. Multiple calculi in the kidneys likewise are chronic. Electronically Signed   By: Lowella Grip III M.D.   On: 06/27/2020 15:31   Result Date: 06/27/2020 CLINICAL DATA:  Abdominal pain EXAM: CT ABDOMEN AND PELVIS WITHOUT CONTRAST TECHNIQUE: Multidetector CT imaging of the abdomen and pelvis was performed following the standard protocol without oral or IV contrast. COMPARISON:  December 22, 2019 FINDINGS: Lower chest: There is mild bibasilar atelectasis. No edema or airspace  opacity in the lung bases. Hepatobiliary: No focal liver lesions are appreciable on this noncontrast enhanced study. Gallbladder wall is not appreciably thickened. There is no biliary duct dilatation. Pancreas: There is no pancreatic mass or inflammatory focus. Spleen: No splenic lesions evident. A small accessory spleen is noted anteriorly. Adrenals/Urinary Tract: Adrenals bilaterally appear normal. There is severe hydronephrosis on the left. There is no appreciable hydronephrosis on the right. There is an apparent cyst in the upper pole the right kidney measuring 2.5 x 1.4 cm. There are calcifications throughout the right kidney both more centrally and peripherally. There is a staghorn type calculus in upper pole calices on the right measuring 1.9 x 1.4 cm. There is a calculus in the mid right kidney peripherally measuring 1.1 x 1.1 cm. Several smaller calcification are noted in the right kidney as well, stable. There is calcification in the lower pole of the left kidney measuring 1.9 x 1.5 cm. This calculus is a staghorn type calculus in a lower pole calyx with nearby scarring. Scattered subcentimeter calculi noted. There is apparent stenosis in the proximal left ureter slightly distal to the left ureteropelvic junction. A focal calculus in this area measures 0.8 x 0.4 cm. No other ureteral calculi are evident on either side. Urinary bladder is midline with wall thickness within normal limits. There has been apparent urinary diversion to an ileal conduit located in the right pelvis. Right ileostomy is patent. Stomach/Bowel: Left lower quadrant colostomy noted with patency in this area. There is moderate stool in colon. There is no appreciable bowel wall or mesenteric thickening. There is no evident bowel obstruction. There is no free air or portal venous air. There is mild fatty infiltration in the ileocecal valve. Vascular/Lymphatic: No abdominal aortic aneurysm. No appreciable vascular lesions are evident on  this noncontrast enhanced study. No adenopathy is appreciable in the abdomen or pelvis. Reproductive: Prostate and seminal vesicles appear unremarkable. Other: No periappendiceal region inflammation. No abscess or ascites evident in the abdomen or pelvis. Musculoskeletal: Chronic hip dislocation on the left with dysplasia in the left hip joint region. Remodeling of the left femoral head noted with shallow acetabulum. Changes of spina bifida present and stable. Extensive spinal dysraphism is noted throughout the lumbar region with ankylosis at L3-4, stable. No blastic or lytic bone lesions are evident. There is a advanced muscle atrophy throughout the pelvis and thigh regions. There is soft tissue thickening in each hip joint,  more severe on the left than on the right, likely due to hip joint effusions. This appearance is stable compared to most recent study. IMPRESSION: 1. Severe hydronephrosis on the left. There is an 8 x 4 mm calculus in the proximal left ureter with an apparent ureteral stricture on the left immediately distal to this calculus. There are areas of scarring in each kidney with multiple calculi, largely due to staghorn type calculi in multiple calices. No appreciable hydronephrosis on the right evident. 2. Left lower quadrant ostomy, patent. Apparent previous urinary diversion with ileal conduit in the right pelvis with right ostomy which is patent. 3. No bowel obstruction. No abscess in the abdomen or pelvis. No periappendiceal region inflammation. 4. Apparent hip joint effusions bilaterally, significantly larger on the left than on the right. Infected joint effusion on the left cannot be excluded. No air in this region, however. 5.  Chronic appearing hip dislocation on the left with remodeling. 6. Spinal dysraphism with spina bifida occulta. Underlying osteoporosis. Advanced muscle atrophy throughout pelvis and thigh regions. Electronically Signed: By: Lowella Grip III M.D. On: 06/27/2020  14:45     EKG: Independently reviewed.  Not done in ED, will get one.   Assessment/Plan Principal Problem:   Complicated UTI (urinary tract infection) Active Problems:   Sepsis (HCC)   Hypokalemia   GERD (gastroesophageal reflux disease)   Hyperlipidemia   Left ureteral stone   HTN (hypertension)   Acute renal failure superimposed on stage 3a chronic kidney disease ([place])   Spina bifida ([place])   Hydronephrosis   Kidney stone   Sepsis due to complicated UTI (urinary tract infection): Patient meets criteria for sepsis with leukocytosis and tachycardia with heart rate of 102.  Lactic acid is normal.  Currently hemodynamically stable. Patient has hydronephrosis and new left ureteral stone.  ED physician consulted IR Dr. Anselm Pancoast. We also consulted Dr. Lovena Neighbours of urology.  The plan is to treat the patient with antibiotics and observe closely.  If patient deteriorates, may need procedure later.  -Admitted to MedSurg bed as inpatient -IV cefepime was started in ED, will continue -Follow-up blood culture and urine culture -will get Procalcitonin and trend lactic acid levels per sepsis protocol. -IVF: 2L of LR bolus in ED, followed by 125 cc/h   Hypokalemia: Potassium 3.4 -Repleted potassium -Check magnesium level  GERD (gastroesophageal reflux disease) -Protonix  Hyperlipidemia -Crestor  Left ureteral stone, hydronephrosis and kidney stone -urology is consulted, f/u further recommendations  HTN (hypertension) -Hold HCTZ due to sepsis  Acute renal failure superimposed on stage 3a chronic kidney disease ([place]): Recent baseline creatinine 1.22 on 12/24/2019.  His creatinine is 1.8, BUN 39 today.  Likely due to UTI. -f/u by BMP -Hold HCTZ -IVF as above  Spina bifida ([place]) -s/p urostomy and colostomy.           DVT ppx: SQ Heparin     Code Status: Full code Family Communication: not done, no family member is at bed side.    Disposition Plan:  Anticipate discharge back to  previous environment Consults called:  Urology, Dr. Lovena Neighbours and IR, Dr. Anselm Pancoast are consulted. Admission status: Med-surg bed as inpt      Status is: Inpatient  Remains inpatient appropriate because:Inpatient level of care appropriate due to severity of illness.  Patient is a complicated multiple comorbidities, now presents with sepsis due to complicated UTI.  Patient has bilateral hydronephrosis, kidney stone.  He also has worsening renal function, hypokalemia.  His presentation is highly complicated.  Patient is at high risk of deteriorating.  Need to be treated in hospital for at least 2 days.   Dispo: The patient is from: Home              Anticipated d/c is to: Home              Anticipated d/c date is: 2 days              Patient currently is not medically stable to d/c.           Date of Service 06/27/2020    Ivor Costa [facility]   If 7PM-7AM, please contact night-coverage [email or web address] 06/27/2020, 7:29 PM

## 2020-06-27 NOTE — Consult Note (Addendum)
Urology Consult   Physician requesting consult: Ivor Costa, MD  Reason for consult: Bilateral hydronephrosis with left UPJ calculus  History of Present Illness: [name] is a 54 y.o. male with a history of spina bifida, neurogenic bladder requiring ileal conduit urinary diversion, status post recent conduit revision, chronic bilateral hydronephrosis and bilateral nephrolithiasis status post recent PCNL at [facility].  The patient presents to the emergency department with a 24-hour history of general malaise and generalized abdominal pain associated with nausea but no vomiting and a fever of 101.7 last night.  The patient had a CT scan today that revealed chronic bilateral hydronephrosis along with bilateral nephrolithiasis with a small stone fragment in the left UPJ.  He was also noted to have what appears to be severe stricture disease of the proximal aspects of the left ureter.  The patient denies any left-sided flank pain or hematuria.  Currently, the patient is resting comfortably in bed and states that he feels better after an IV fluid bolus, but continues to have rigors.  Past Medical History:  Diagnosis Date   Decubitus ulcer    Renal disorder    Spina bifida [facility])     Past Surgical History:  Procedure Laterality Date   ABDOMINAL SURGERY     BACK SURGERY     COLON SURGERY     ESOPHAGOGASTRODUODENOSCOPY (EGD) WITH PROPOFOL N/A 06/03/2016   Procedure: ESOPHAGOGASTRODUODENOSCOPY (EGD) WITH PROPOFOL;  Surgeon: [name], MD;  Location: [facility] ENDOSCOPY;  Service: Endoscopy;  Laterality: N/A;   ESOPHAGOGASTRODUODENOSCOPY (EGD) WITH PROPOFOL N/A 04/27/2019   Procedure: ESOPHAGOGASTRODUODENOSCOPY (EGD) WITH PROPOFOL;  Surgeon: Jonathon Bellows, MD;  Location: [facility] ENDOSCOPY;  Service: Gastroenterology;  Laterality: N/A;   ilieostomy     VENTRICULOPERITONEAL SHUNT     vp shunt removal      Current Hospital Medications:  Home Meds:  Current Meds  Medication Sig   dicyclomine  (BENTYL) 10 MG capsule Take 10 mg by mouth 3 (three) times daily as needed for spasms.   hydrochlorothiazide (HYDRODIURIL) 25 MG tablet Take 25 mg by mouth daily as needed.   Multiple Vitamins-Minerals (CENTRUM MEN PO) Take 1 tablet by mouth daily.    pantoprazole (PROTONIX) 40 MG tablet Take 1 tablet (40 mg total) by mouth daily.   potassium chloride SA (KLOR-CON) 20 MEQ tablet Take 20 mEq by mouth daily.   rosuvastatin (CRESTOR) 10 MG tablet Take 10 mg by mouth at bedtime.   sucralfate (CARAFATE) 1 g tablet Take 1 g by mouth 3 (three) times daily.    Scheduled Meds: Continuous Infusions:  ceFEPime (MAXIPIME) IV 2 g (06/27/20 1520)   lactated ringers     lactated ringers     PRN Meds:.acetaminophen, ondansetron (ZOFRAN) IV  Allergies:  Allergies  Allergen Reactions   Morphine And Related Nausea And Vomiting   Sulfa Antibiotics Other (See Comments)    Reaction: unknown   Ivp Dye [Iodinated Diagnostic Agents] Hives   Latex Rash    Family History  Problem Relation Age of Onset   Diabetes Mellitus II Father     Social History:  reports that he has never smoked. He has never used smokeless tobacco. He reports that he does not drink alcohol and does not use drugs.  ROS: A complete review of systems was performed.  All systems are negative except for pertinent findings as noted.  Physical Exam:  Vital signs in last 24 hours: Temp:  [98 F (36.7 C)-101.7 F (38.7 C)] 98.4 F (36.9 C) (09/24 0804) Pulse  Rate:  [78-119] 97 (09/24 1600) Resp:  [16-20] 18 (09/24 1600) BP: (95-146)/(59-92) 109/80 (09/24 1600) SpO2:  [90 %-98 %] 94 % (09/24 1600) Weight:  [65.8 kg] 65.8 kg (09/23 2305) Constitutional:  Alert and oriented, No acute distress Cardiovascular: Regular rate and rhythm, No JVD Respiratory: Normal respiratory effort, Lungs clear bilaterally GI: Abdomen is soft, nontender, nondistended, no abdominal masses.  Ileal conduit is viable and draining amber urine.   Colostomy is viable and draining stool. GU: No CVA tenderness Lymphatic: No lymphadenopathy Neurologic: Grossly intact, bilateral lower extremity contractures.  No sensation from the ileal conduit down  Psychiatric: Normal mood and affect  Laboratory Data:  Recent Labs    06/26/20 2315  WBC 18.9*  HGB 14.4  HCT 43.8  PLT 295    Recent Labs    06/26/20 2315  NA 137  K 3.4*  CL 98  GLUCOSE 120*  BUN 39*  CALCIUM 9.4  CREATININE 1.80*     Results for orders placed or performed during the hospital encounter of 06/27/20 (from the past 24 hour(s))  CBC     Status: Abnormal   Collection Time: 06/26/20 11:15 PM  Result Value Ref Range   WBC 18.9 (H) 4.0 - 10.5 K/uL   RBC 5.17 4.22 - 5.81 MIL/uL   Hemoglobin 14.4 13.0 - 17.0 g/dL   HCT 43.8 39 - 52 %   MCV 84.7 80.0 - 100.0 fL   MCH 27.9 26.0 - 34.0 pg   MCHC 32.9 30.0 - 36.0 g/dL   RDW 14.9 11.5 - 15.5 %   Platelets 295 150 - 400 K/uL   nRBC 0.0 0.0 - 0.2 %  Comprehensive metabolic panel     Status: Abnormal   Collection Time: 06/26/20 11:15 PM  Result Value Ref Range   Sodium 137 135 - 145 mmol/L   Potassium 3.4 (L) 3.5 - 5.1 mmol/L   Chloride 98 98 - 111 mmol/L   CO2 24 22 - 32 mmol/L   Glucose, Bld 120 (H) 70 - 99 mg/dL   BUN 39 (H) 6 - 20 mg/dL   Creatinine, Ser 1.80 (H) 0.61 - 1.24 mg/dL   Calcium 9.4 8.9 - 10.3 mg/dL   Total Protein 7.9 6.5 - 8.1 g/dL   Albumin 4.0 3.5 - 5.0 g/dL   AST 26 15 - 41 U/L   ALT 20 0 - 44 U/L   Alkaline Phosphatase 75 38 - 126 U/L   Total Bilirubin 0.7 0.3 - 1.2 mg/dL   GFR calc non Af Amer 42 (L) >60 mL/min   GFR calc Af Amer 49 (L) >60 mL/min   Anion gap 15 5 - 15  Lipase, blood     Status: None   Collection Time: 06/26/20 11:15 PM  Result Value Ref Range   Lipase 44 11 - 51 U/L  Lactic acid, plasma     Status: None   Collection Time: 06/26/20 11:15 PM  Result Value Ref Range   Lactic Acid, Venous 1.5 0.5 - 1.9 mmol/L  Respiratory Panel by RT PCR (Flu A&B, Covid) -  Nasopharyngeal Swab     Status: None   Collection Time: 06/26/20 11:16 PM   Specimen: Nasopharyngeal Swab  Result Value Ref Range   SARS Coronavirus 2 by RT PCR NEGATIVE NEGATIVE   Influenza A by PCR NEGATIVE NEGATIVE   Influenza B by PCR NEGATIVE NEGATIVE  Urinalysis, Complete w Microscopic Nasopharyngeal Swab     Status: Abnormal   Collection Time: 06/27/20 12:47  AM  Result Value Ref Range   Color, Urine YELLOW (A) YELLOW   APPearance CLOUDY (A) CLEAR   Specific Gravity, Urine 1.009 1.005 - 1.030   pH 8.0 5.0 - 8.0   Glucose, UA NEGATIVE NEGATIVE mg/dL   Hgb urine dipstick MODERATE (A) NEGATIVE   Bilirubin Urine NEGATIVE NEGATIVE   Ketones, ur NEGATIVE NEGATIVE mg/dL   Protein, ur 30 (A) NEGATIVE mg/dL   Nitrite POSITIVE (A) NEGATIVE   Leukocytes,Ua LARGE (A) NEGATIVE   RBC / HPF 21-50 0 - 5 RBC/hpf   WBC, UA >50 (H) 0 - 5 WBC/hpf   Bacteria, UA MANY (A) NONE SEEN   Squamous Epithelial / LPF 0-5 0 - 5   WBC Clumps PRESENT    Mucus PRESENT    Hyaline Casts, UA PRESENT    Recent Results (from the past 240 hour(s))  Respiratory Panel by RT PCR (Flu A&B, Covid) - Nasopharyngeal Swab     Status: None   Collection Time: 06/26/20 11:16 PM   Specimen: Nasopharyngeal Swab  Result Value Ref Range Status   SARS Coronavirus 2 by RT PCR NEGATIVE NEGATIVE Final    Comment: (NOTE) SARS-CoV-2 target nucleic acids are NOT DETECTED.  The SARS-CoV-2 RNA is generally detectable in upper respiratoy specimens during the acute phase of infection. The lowest concentration of SARS-CoV-2 viral copies this assay can detect is 131 copies/mL. A negative result does not preclude SARS-Cov-2 infection and should not be used as the sole basis for treatment or other patient management decisions. A negative result may occur with  improper specimen collection/handling, submission of specimen other than nasopharyngeal swab, presence of viral mutation(s) within the areas targeted by this assay, and  inadequate number of viral copies (<131 copies/mL). A negative result must be combined with clinical observations, patient history, and epidemiological information. The expected result is Negative.  Fact Sheet for Patients:  PinkCheek.be  Fact Sheet for Healthcare Providers:  GravelBags.it  This test is no t yet approved or cleared by the [place] FDA and  has been authorized for detection and/or diagnosis of SARS-CoV-2 by FDA under an Emergency Use Authorization (EUA). This EUA will remain  in effect (meaning this test can be used) for the duration of the COVID-19 declaration under Section 564(b)(1) of the Act, 21 U.S.C. section 360bbb-3(b)(1), unless the authorization is terminated or revoked sooner.     Influenza A by PCR NEGATIVE NEGATIVE Final   Influenza B by PCR NEGATIVE NEGATIVE Final    Comment: (NOTE) The Xpert Xpress SARS-CoV-2/FLU/RSV assay is intended as an aid in  the diagnosis of influenza from Nasopharyngeal swab specimens and  should not be used as a sole basis for treatment. Nasal washings and  aspirates are unacceptable for Xpert Xpress SARS-CoV-2/FLU/RSV  testing.  Fact Sheet for Patients: PinkCheek.be  Fact Sheet for Healthcare Providers: GravelBags.it  This test is not yet approved or cleared by the [place] FDA and  has been authorized for detection and/or diagnosis of SARS-CoV-2 by  FDA under an Emergency Use Authorization (EUA). This EUA will remain  in effect (meaning this test can be used) for the duration of the  Covid-19 declaration under Section 564(b)(1) of the Act, 21  U.S.C. section 360bbb-3(b)(1), unless the authorization is  terminated or revoked. Performed at [facility], [address] [place], [place] [address]     Renal Function: Recent Labs    06/26/20 2315  CREATININE 1.80*   Estimated  Creatinine Clearance: 32.6 mL/min (A) (by  C-G formula based on SCr of 1.8 mg/dL (H)).  Radiologic Imaging: CT ABDOMEN PELVIS WO CONTRAST  Addendum Date: 06/27/2020   ADDENDUM REPORT: 06/27/2020 15:31 ADDENDUM: Note that while the calculus in the proximal left ureter has not been seen previously, the degree of hydronephrosis on the left remains chronic. The apparent effusion in the left and to a lesser extent right hip joints also appear chronic and grossly stable with respect to thickening in these areas. Multiple calculi in the kidneys likewise are chronic. Electronically Signed   By: Lowella Grip III M.D.   On: 06/27/2020 15:31   Result Date: 06/27/2020 CLINICAL DATA:  Abdominal pain EXAM: CT ABDOMEN AND PELVIS WITHOUT CONTRAST TECHNIQUE: Multidetector CT imaging of the abdomen and pelvis was performed following the standard protocol without oral or IV contrast. COMPARISON:  December 22, 2019 FINDINGS: Lower chest: There is mild bibasilar atelectasis. No edema or airspace opacity in the lung bases. Hepatobiliary: No focal liver lesions are appreciable on this noncontrast enhanced study. Gallbladder wall is not appreciably thickened. There is no biliary duct dilatation. Pancreas: There is no pancreatic mass or inflammatory focus. Spleen: No splenic lesions evident. A small accessory spleen is noted anteriorly. Adrenals/Urinary Tract: Adrenals bilaterally appear normal. There is severe hydronephrosis on the left. There is no appreciable hydronephrosis on the right. There is an apparent cyst in the upper pole the right kidney measuring 2.5 x 1.4 cm. There are calcifications throughout the right kidney both more centrally and peripherally. There is a staghorn type calculus in upper pole calices on the right measuring 1.9 x 1.4 cm. There is a calculus in the mid right kidney peripherally measuring 1.1 x 1.1 cm. Several smaller calcification are noted in the right kidney as well, stable. There is calcification  in the lower pole of the left kidney measuring 1.9 x 1.5 cm. This calculus is a staghorn type calculus in a lower pole calyx with nearby scarring. Scattered subcentimeter calculi noted. There is apparent stenosis in the proximal left ureter slightly distal to the left ureteropelvic junction. A focal calculus in this area measures 0.8 x 0.4 cm. No other ureteral calculi are evident on either side. Urinary bladder is midline with wall thickness within normal limits. There has been apparent urinary diversion to an ileal conduit located in the right pelvis. Right ileostomy is patent. Stomach/Bowel: Left lower quadrant colostomy noted with patency in this area. There is moderate stool in colon. There is no appreciable bowel wall or mesenteric thickening. There is no evident bowel obstruction. There is no free air or portal venous air. There is mild fatty infiltration in the ileocecal valve. Vascular/Lymphatic: No abdominal aortic aneurysm. No appreciable vascular lesions are evident on this noncontrast enhanced study. No adenopathy is appreciable in the abdomen or pelvis. Reproductive: Prostate and seminal vesicles appear unremarkable. Other: No periappendiceal region inflammation. No abscess or ascites evident in the abdomen or pelvis. Musculoskeletal: Chronic hip dislocation on the left with dysplasia in the left hip joint region. Remodeling of the left femoral head noted with shallow acetabulum. Changes of spina bifida present and stable. Extensive spinal dysraphism is noted throughout the lumbar region with ankylosis at L3-4, stable. No blastic or lytic bone lesions are evident. There is a advanced muscle atrophy throughout the pelvis and thigh regions. There is soft tissue thickening in each hip joint, more severe on the left than on the right, likely due to hip joint effusions. This appearance is stable compared to most recent study. IMPRESSION: 1.  Severe hydronephrosis on the left. There is an 8 x 4 mm calculus in  the proximal left ureter with an apparent ureteral stricture on the left immediately distal to this calculus. There are areas of scarring in each kidney with multiple calculi, largely due to staghorn type calculi in multiple calices. No appreciable hydronephrosis on the right evident. 2. Left lower quadrant ostomy, patent. Apparent previous urinary diversion with ileal conduit in the right pelvis with right ostomy which is patent. 3. No bowel obstruction. No abscess in the abdomen or pelvis. No periappendiceal region inflammation. 4. Apparent hip joint effusions bilaterally, significantly larger on the left than on the right. Infected joint effusion on the left cannot be excluded. No air in this region, however. 5.  Chronic appearing hip dislocation on the left with remodeling. 6. Spinal dysraphism with spina bifida occulta. Underlying osteoporosis. Advanced muscle atrophy throughout pelvis and thigh regions. Electronically Signed: By: Lowella Grip III M.D. On: 06/27/2020 14:45    I independently reviewed the above imaging studies.  Impression/Recommendation 54 year old male with a history of spina bifida, neurogenic bladder, status post ileal conduit urinary diversion with subsequent revision, chronic bilateral hydronephrosis and chronic bilateral nephrolithiasis now with general malaise and fever of unknown source.   -Upon review of his recent cross-sectional imaging with Dr. Anselm Pancoast with interventional radiology, the degree of hydronephrosis seen on CT today compared to his last CT in March is very stable in severity.  There is a small stone in the left UPJ, but it does not appear to be obstructing and the patient is not having any significant left sided flank pain.  Recommend empiric antibiotic coverage while urine and blood cultures are pending and continued IV fluid resuscitation.  If his clinical status does not improve over the next 12 to 24 hours, recommend left nephrostomy tube  placement.  Ellison Hughs, MD [facility] 06/27/2020, 4:58 PM

## 2020-06-27 NOTE — Progress Notes (Signed)
14:15Arrived to room to start IV. Pt. Being wheeled out of room via stretcher.

## 2020-06-27 NOTE — ED Notes (Signed)
Antibiotics and Blood cultures delayed. Pt difficult IV stick. IV team consult placed.

## 2020-06-27 NOTE — ED Provider Notes (Addendum)
[facility] Emergency Department Provider Note   ____________________________________________   First MD Initiated Contact with Patient 06/27/20 1238     (approximate)  I have reviewed the triage vital signs and the nursing notes.   HISTORY  Chief Complaint Abdominal Pain    HPI [name] is a 54 y.o. male with spina bifida who came in complaining of pain in his abdomen and feeling really ill as though he had a UTI.  He says he has had multiple UTIs in knows when he has 1 so he comes in.  Last UTI had multidrug-resistant bacteria.  Patient is feeling somewhat better now.  His ostomy is putting out some formed stool.  Urine is cloudy.  His white count is elevated.  History of frequent UTIs and feeling ill and multidrug-resistant bacteria we will plan on getting him in the hospital.  I think that would be much safer.         Past Medical History:  Diagnosis Date  . Decubitus ulcer   . Renal disorder   . Spina bifida [facility])     Patient Active Problem List   Diagnosis Date Noted  . Hyperlipidemia   . Hiccups   . Acute lower UTI 12/22/2019  . GERD (gastroesophageal reflux disease) 12/22/2019  . SBO (small bowel obstruction) ([place]) 11/02/2017  . Abdominal distention 10/11/2016  . Distal intestinal obstruction syndrome ([place]) 10/11/2016  . Hypokalemia 10/11/2016  . Leukocytosis 10/11/2016  . Loose stools 10/11/2016  . Fecal impaction in rectum ([place]) 10/10/2016  . Nausea & vomiting 06/03/2016  . Gastric outlet obstruction 06/03/2016  . SIRS (systemic inflammatory response syndrome) ([place]) 06/03/2016  . Pressure ulcer 06/03/2016  . Impaction of the bowels ([place])   . Gastritis   . Ulcer of esophagus with bleeding   . Nausea   . Nausea vomiting and diarrhea   . Sepsis ([place]) 04/21/2016    Past Surgical History:  Procedure Laterality Date  . ABDOMINAL SURGERY    . BACK SURGERY    . COLON SURGERY    . ESOPHAGOGASTRODUODENOSCOPY (EGD) WITH  PROPOFOL N/A 06/03/2016   Procedure: ESOPHAGOGASTRODUODENOSCOPY (EGD) WITH PROPOFOL;  Surgeon: [name], MD;  Location: [facility] ENDOSCOPY;  Service: Endoscopy;  Laterality: N/A;  . ESOPHAGOGASTRODUODENOSCOPY (EGD) WITH PROPOFOL N/A 04/27/2019   Procedure: ESOPHAGOGASTRODUODENOSCOPY (EGD) WITH PROPOFOL;  Surgeon: Jonathon Bellows, MD;  Location: [facility] ENDOSCOPY;  Service: Gastroenterology;  Laterality: N/A;  . ilieostomy    . VENTRICULOPERITONEAL SHUNT    . vp shunt removal      Prior to Admission medications   Medication Sig Start Date End Date Taking? Authorizing Provider  acetaminophen (TYLENOL) 325 MG tablet Take 2 tablets (650 mg total) by mouth every 6 (six) hours as needed for mild pain (or Fever >/= 101). 12/25/19   Wieting, Richard, MD  cefTRIAXone 2 g in sodium chloride 0.9 % 100 mL Inject 2 g into the vein every evening. 12/25/19   Loletha Grayer, MD  chlorproMAZINE (THORAZINE) 10 MG tablet Take 1 tablet (10 mg total) by mouth 3 (three) times daily as needed for hiccoughs. 12/25/19   Loletha Grayer, MD  metoCLOPramide (REGLAN) 5 MG tablet Take 5 mg by mouth every 8 (eight) hours as needed for nausea or vomiting.  10/24/19   [provider]  Multiple Vitamins-Minerals (CENTRUM MEN PO) Take 1 tablet by mouth daily.     [provider]  pantoprazole (PROTONIX) 40 MG tablet Take 1 tablet (40 mg total) by mouth daily. 04/29/19 12/22/19  Henreitta Leber, MD  promethazine (PHENERGAN) 12.5 MG tablet Take 1 tablet (12.5 mg total) by mouth every 6 (six) hours as needed for nausea or vomiting. 12/25/19   Loletha Grayer, MD  protein supplement shake (PREMIER PROTEIN) LIQD Take 325 mLs (11 oz total) by mouth 2 (two) times daily between meals. Patient not taking: Reported on 12/22/2019 11/05/17   Epifanio Lesches, MD  rosuvastatin (CRESTOR) 10 MG tablet Take 10 mg by mouth at bedtime. 10/20/19   [provider]  sucralfate (CARAFATE) 1 g tablet Take 1 g by mouth 3 (three) times  daily. 12/14/19   [provider]    Allergies Ivp dye [iodinated diagnostic agents], Morphine and related, Sulfa antibiotics, and Latex  Family History  Problem Relation Age of Onset  . Diabetes Mellitus II Father     Social History Social History   Tobacco Use  . Smoking status: Never Smoker  . Smokeless tobacco: Never Used  Substance Use Topics  . Alcohol use: No  . Drug use: No    Review of Systems  Constitutional: No fever/chills Eyes: No visual changes. ENT: No sore throat. Cardiovascular: Denies chest pain. Respiratory: Denies shortness of breath. Gastrointestinal: No abdominal pain.  No nausea, no vomiting.  No diarrhea.  No constipation. Genitourinary: Negative for dysuria. Musculoskeletal: Negative for back pain. Skin: Negative for rash. Neurological: Negative for headaches, focal weakness   ____________________________________________   PHYSICAL EXAM:  VITAL SIGNS: ED Triage Vitals [06/26/20 2305]  Enc Vitals Group     BP (!) 146/76     Pulse Rate (!) 119     Resp 20     Temp (!) 101.7 F (38.7 C)     Temp Source Oral     SpO2 93 %     Weight 145 lb (65.8 kg)     Height      Head Circumference      Peak Flow      Pain Score 5     Pain Loc      Pain Edu?      Excl. in [place]?     Constitutional: Alert and oriented. Well appearing and in no acute distress. Eyes: Conjunctivae are normal.  Head: Atraumatic. Nose: No congestion/rhinnorhea. Mouth/Throat: Mucous membranes are moist.  Oropharynx non-erythematous. Neck: No stridor. Cardiovascular: Normal rate, regular rhythm. Grossly normal heart sounds.  Good peripheral circulation. Respiratory: Normal respiratory effort.  No retractions. Lungs CTAB. Gastrointestinal: Soft minimally diffusely tender. No distention. No abdominal bruits. No CVA tenderness. Musculoskeletal: No lower extremity tenderness nor edema.   Neurologic:  Normal speech and language. No gross focal neurologic deficits  are appreciated.  Skin:  Skin is warm, dry and intact. No rash noted.   ____________________________________________   LABS (all labs ordered are listed, but only abnormal results are displayed)  Labs Reviewed  CBC - Abnormal; Notable for the following components:      Result Value   WBC 18.9 (*)    All other components within normal limits  COMPREHENSIVE METABOLIC PANEL - Abnormal; Notable for the following components:   Potassium 3.4 (*)    Glucose, Bld 120 (*)    BUN 39 (*)    Creatinine, Ser 1.80 (*)    GFR calc non Af Amer 42 (*)    GFR calc Af Amer 49 (*)    All other components within normal limits  URINALYSIS, COMPLETE (UACMP) WITH MICROSCOPIC - Abnormal; Notable for the following components:   Color, Urine YELLOW (*)  APPearance CLOUDY (*)    Hgb urine dipstick MODERATE (*)    Protein, ur 30 (*)    Nitrite POSITIVE (*)    Leukocytes,Ua LARGE (*)    WBC, UA >50 (*)    Bacteria, UA MANY (*)    All other components within normal limits  RESPIRATORY PANEL BY RT PCR (FLU A&B, COVID)  LIPASE, BLOOD  LACTIC ACID, PLASMA  LACTIC ACID, PLASMA   ____________________________________________  EKG   ____________________________________________  RADIOLOGY  ED MD interpretation: CT read by radiology shows an obstructed left kidney with hydronephrosis and kidney stone along with a ureteral stricture immediately behind the kidney stone.  Additionally there are bilateral hip joint effusions.  I reviewed the film.  Official radiology report(s): No results found.  ____________________________________________   PROCEDURES  Procedure(s) performed (including Critical Care): Critical care time 30 minutes.  This includes discussing the patient with urology and orthopedics and interventional radiology who reviewed multiple films and told me that it looks like the kidney and the hips are chronic.  The patient of course cannot feel the hips.  Patient's not having any pain  particularly anywhere right this minute.  He does still meet sepsis criteria so we will get him in the hospital for further IV antibiotics particularly since his previous UTI has been with multidrug-resistant organisms.  Procedures   ____________________________________________   INITIAL IMPRESSION / ASSESSMENT AND PLAN / ED COURSE  Patient still meets sepsis criteria with his white count being elevated and his pulse rate being above 100.  He does not look septic.  Has been waiting in the waiting room for quite some time.  We will give him some IV antibiotics and some fluids.    ----------------------------------------- 3:07 PM on 06/27/2020 -----------------------------------------  Patient has an obstructing stone with UTI.  Will have to call urology immediately.  Additionally he may have 2 infected hips.  He does not feel or move anything below his waist he has never been able to feel his hips.  The also may represent abscesses.   ----------------------------------------- 3:32 PM on 06/27/2020 -----------------------------------------  Discussed this patient with IR.  Radiologist reports the left kidney hydronephrosis is chronic has been there for quite some time and the joint space effusions and hips are also unchanged for quite some time.       ____________________________________________   FINAL CLINICAL IMPRESSION(S) / ED DIAGNOSES  Final diagnoses:  Urinary tract infection with hematuria, site unspecified  Sepsis, due to unspecified organism, unspecified whether acute organ dysfunction present [facility])     ED Discharge Orders    None      *Please note:  [name] was evaluated in Emergency Department on 06/27/2020 for the symptoms described in the history of present illness. He was evaluated in the context of the global COVID-19 pandemic, which necessitated consideration that the patient might be at risk for infection with the SARS-CoV-2 virus that causes  COVID-19. Institutional protocols and algorithms that pertain to the evaluation of patients at risk for COVID-19 are in a state of rapid change based on information released by regulatory bodies including the CDC and federal and state organizations. These policies and algorithms were followed during the patient's care in the ED.  Some ED evaluations and interventions may be delayed as a result of limited staffing during and the pandemic.*   Note:  This document was prepared using Dragon voice recognition software and may include unintentional dictation errors.    Nena Polio, MD 06/27/20 1319 I  should add that patient's belly feels much better since his ostomy has begin putting out formed stool.  He gives me this history himself.   Nena Polio, MD 06/27/20 1320    Nena Polio, MD 06/27/20 1534

## 2020-06-27 NOTE — Progress Notes (Signed)
Pharmacy Antibiotic Note  [name] is a 54 y.o. male with history of spina bifida w colostomy/urostomy in place admitted on 06/27/2020 with abdominal pain and found to have UTI/sepsis. Of note, patient has history of recurrent UTI with MDR Providencia stuartii (was susceptible to ceftriaxone in the past). Pharmacy has been consulted for cefepime dosing.  Tmax 101.7 and WBC elevated at 19. SCr is above baseline at 1.8 (baseline ~ 1.3). UA showing many bacteria, leukocyte/nitrite positive. No urine culture yet obtained.   Plan: Cefepime 2g IV q12h Monitor clinical picture, renal function F/U C&S, abx de-escalation, LOT    Height: 4\' 6"  (137.2 cm) (per [facility] 12/2019) Weight: 65.8 kg (145 lb) IBW/kg (Calculated) : 36.2  Temp (24hrs), Avg:99.1 F (37.3 C), Min:98 F (36.7 C), Max:101.7 F (38.7 C)  Recent Labs  Lab 06/26/20 2315  WBC 18.9*  CREATININE 1.80*  LATICACIDVEN 1.5    Estimated Creatinine Clearance: 32.6 mL/min (A) (by C-G formula based on SCr of 1.8 mg/dL (H)).    Allergies  Allergen Reactions  . Ivp Dye [Iodinated Diagnostic Agents] Hives  . Morphine And Related Nausea And Vomiting  . Sulfa Antibiotics Other (See Comments)    Reaction: unknown  . Latex Rash    Antimicrobials this admission: Cefepime 9/24>  Microbiology results: 9/24 BCx> not yet collected   Thank you for allowing pharmacy to be a part of this patient's care.  Javonna Balli Lessie Dings 06/27/2020 1:40 PM

## 2020-06-27 NOTE — ED Notes (Signed)
Pt c/o abdominal pain that started last night. Pt has hx of spina bifida w/ colostomy and urostomy in place.

## 2020-06-28 DIAGNOSIS — N1831 Chronic kidney disease, stage 3a: Secondary | ICD-10-CM

## 2020-06-28 DIAGNOSIS — N390 Urinary tract infection, site not specified: Secondary | ICD-10-CM

## 2020-06-28 DIAGNOSIS — N132 Hydronephrosis with renal and ureteral calculous obstruction: Secondary | ICD-10-CM

## 2020-06-28 DIAGNOSIS — N178 Other acute kidney failure: Secondary | ICD-10-CM

## 2020-06-28 LAB — BLOOD CULTURE ID PANEL (REFLEXED) - BCID2

## 2020-06-28 LAB — CBC
HCT: 39.7 % (ref 39.0–52.0)
Hemoglobin: 12.5 g/dL — ABNORMAL LOW (ref 13.0–17.0)
MCH: 27.8 pg (ref 26.0–34.0)
MCHC: 31.5 g/dL (ref 30.0–36.0)
MCV: 88.4 fL (ref 80.0–100.0)
Platelets: 251 10*3/uL (ref 150–400)
RBC: 4.49 MIL/uL (ref 4.22–5.81)
RDW: 15.4 % (ref 11.5–15.5)
WBC: 10.6 10*3/uL — ABNORMAL HIGH (ref 4.0–10.5)
nRBC: 0 % (ref 0.0–0.2)

## 2020-06-28 LAB — BASIC METABOLIC PANEL
Anion gap: 14 (ref 5–15)
BUN: 24 mg/dL — ABNORMAL HIGH (ref 6–20)
CO2: 24 mmol/L (ref 22–32)
Calcium: 8.8 mg/dL — ABNORMAL LOW (ref 8.9–10.3)
Chloride: 103 mmol/L (ref 98–111)
Creatinine, Ser: 1.52 mg/dL — ABNORMAL HIGH (ref 0.61–1.24)
GFR calc Af Amer: >60 mL/min (ref >60)
GFR calc non Af Amer: 52 mL/min — ABNORMAL LOW (ref >60)
Glucose, Bld: 68 mg/dL — ABNORMAL LOW (ref 70–99)
Potassium: 3.2 mmol/L — ABNORMAL LOW (ref 3.5–5.1)
Sodium: 141 mmol/L (ref 135–145)

## 2020-06-28 LAB — HIV ANTIBODY (ROUTINE TESTING W REFLEX): HIV Screen 4th Generation wRfx: NONREACTIVE

## 2020-06-28 LAB — MAGNESIUM: Magnesium: 2.1 mg/dL (ref 1.7–2.4)

## 2020-06-28 NOTE — Progress Notes (Signed)
PHARMACY - PHYSICIAN COMMUNICATION CRITICAL VALUE ALERT - BLOOD CULTURE IDENTIFICATION (BCID)  [name] is an 54 y.o. male who presented to [facility] on 06/27/2020 with a chief complaint of urosepsis.  Assessment:  1/2 bottles GPC (single set blood culture). BCID with Staphylococcus epidermidis, MecA not detected. This result could reflect contamination.   Name of physician (or Provider) Contacted: Sharion Settler  Current antibiotics: Cefepime   Changes to prescribed antibiotics recommended:  Patient is on recommended antibiotics - No changes needed. Cefazolin would be preferred agent if true bacteremia. However, with concomitant urinary infection this agent would lack adequate gram negative coverage.  Results for orders placed or performed during the hospital encounter of 06/27/20  Blood Culture ID Panel (Reflexed) (Collected: 06/27/2020  3:11 PM)  Result Value Ref Range   Enterococcus faecalis NOT DETECTED NOT DETECTED   Enterococcus Faecium NOT DETECTED NOT DETECTED   Listeria monocytogenes NOT DETECTED NOT DETECTED   Staphylococcus species DETECTED (A) NOT DETECTED   Staphylococcus aureus (BCID) NOT DETECTED NOT DETECTED   Staphylococcus epidermidis DETECTED (A) NOT DETECTED   Staphylococcus lugdunensis NOT DETECTED NOT DETECTED   Streptococcus species NOT DETECTED NOT DETECTED   Streptococcus agalactiae NOT DETECTED NOT DETECTED   Streptococcus pneumoniae NOT DETECTED NOT DETECTED   Streptococcus pyogenes NOT DETECTED NOT DETECTED   A.calcoaceticus-baumannii NOT DETECTED NOT DETECTED   Bacteroides fragilis NOT DETECTED NOT DETECTED   Enterobacterales NOT DETECTED NOT DETECTED   Enterobacter cloacae complex NOT DETECTED NOT DETECTED   Escherichia coli NOT DETECTED NOT DETECTED   Klebsiella aerogenes NOT DETECTED NOT DETECTED   Klebsiella oxytoca NOT DETECTED NOT DETECTED   Klebsiella pneumoniae NOT DETECTED NOT DETECTED   Proteus species NOT DETECTED NOT DETECTED    Salmonella species NOT DETECTED NOT DETECTED   Serratia marcescens NOT DETECTED NOT DETECTED   Haemophilus influenzae NOT DETECTED NOT DETECTED   Neisseria meningitidis NOT DETECTED NOT DETECTED   Pseudomonas aeruginosa NOT DETECTED NOT DETECTED   Stenotrophomonas maltophilia NOT DETECTED NOT DETECTED   Candida albicans NOT DETECTED NOT DETECTED   Candida auris NOT DETECTED NOT DETECTED   Candida glabrata NOT DETECTED NOT DETECTED   Candida krusei NOT DETECTED NOT DETECTED   Candida parapsilosis NOT DETECTED NOT DETECTED   Candida tropicalis NOT DETECTED NOT DETECTED   Cryptococcus neoformans/gattii NOT DETECTED NOT DETECTED   Methicillin resistance mecA/C NOT DETECTED NOT DETECTED    Benita Gutter 06/28/2020  11:55 PM

## 2020-06-28 NOTE — ED Notes (Signed)
Pt repositioned; given dinner tray.

## 2020-06-28 NOTE — ED Notes (Signed)
Pt given water , ok per Admit Sreenath

## 2020-06-28 NOTE — ED Notes (Signed)
Attempted to draw 5 am labs, unsuccessful.  Notified lab to come and draw morning labs.

## 2020-06-28 NOTE — ED Notes (Signed)
Patient denies pain and is resting comfortably.  

## 2020-06-28 NOTE — ED Notes (Signed)
Per RN Dawn Lab has been called and has added pt onto list for collecting labs

## 2020-06-28 NOTE — ED Notes (Signed)
Pt given another cup of water as requested.

## 2020-06-28 NOTE — ED Notes (Signed)
Pt transported by Melody, NT.

## 2020-06-28 NOTE — ED Notes (Signed)
Lab at bedside

## 2020-06-28 NOTE — Progress Notes (Signed)
Urology Inpatient Progress Report  UTI (urinary tract infection) [N39.0]    Intv/Subj: No acute events overnight. The patient continues to have some diffuse abdominal pain and mild nausea.  However, he has feeling hungry and thirsty this morning.  Principal Problem:   Complicated UTI (urinary tract infection) Active Problems:   Sepsis ([place])   Hypokalemia   GERD (gastroesophageal reflux disease)   Hyperlipidemia   Left ureteral stone   HTN (hypertension)   Acute renal failure superimposed on stage 3a chronic kidney disease ([place])   Spina bifida ([place])   Hydronephrosis   Kidney stone  Current Facility-Administered Medications  Medication Dose Route Frequency Provider Last Rate Last Admin  . acetaminophen (TYLENOL) tablet 650 mg  650 mg Oral Q6H PRN Ivor Costa, MD      . ceFEPIme (MAXIPIME) 2 g in sodium chloride 0.9 % 100 mL IVPB  2 g Intravenous Q12H Ivor Costa, MD   Stopped at 06/27/20 2200  . dicyclomine (BENTYL) capsule 10 mg  10 mg Oral TID PRN Ivor Costa, MD      . fentaNYL (SUBLIMAZE) injection 12.5 mcg  12.5 mcg Intravenous Q3H PRN Ivor Costa, MD      . heparin injection 5,000 Units  5,000 Units Subcutaneous Q8H Ivor Costa, MD      . lactated ringers infusion   Intravenous Continuous Ivor Costa, MD 125 mL/hr at 06/28/20 0729 Rate Verify at 06/28/20 0729  . multivitamin with minerals tablet 1 tablet  1 tablet Oral Daily Ivor Costa, MD      . ondansetron [facility]) injection 4 mg  4 mg Intravenous Q8H PRN Ivor Costa, MD      . pantoprazole (PROTONIX) EC tablet 40 mg  40 mg Oral Daily Ivor Costa, MD      . potassium chloride SA (KLOR-CON) CR tablet 30 mEq  30 mEq Oral Once Ivor Costa, MD      . rosuvastatin (CRESTOR) tablet 10 mg  10 mg Oral QHS Ivor Costa, MD      . sucralfate (CARAFATE) tablet 1 g  1 g Oral TID Ivor Costa, MD       Current Outpatient Medications  Medication Sig Dispense Refill  . dicyclomine (BENTYL) 10 MG capsule Take 10 mg by mouth 3 (three) times daily as  needed for spasms.    . hydrochlorothiazide (HYDRODIURIL) 25 MG tablet Take 25 mg by mouth daily as needed.    . Multiple Vitamins-Minerals (CENTRUM MEN PO) Take 1 tablet by mouth daily.     . pantoprazole (PROTONIX) 40 MG tablet Take 1 tablet (40 mg total) by mouth daily. 30 tablet 1  . potassium chloride SA (KLOR-CON) 20 MEQ tablet Take 20 mEq by mouth daily.    . rosuvastatin (CRESTOR) 10 MG tablet Take 10 mg by mouth at bedtime.    . sucralfate (CARAFATE) 1 g tablet Take 1 g by mouth 3 (three) times daily.    Marland Kitchen acetaminophen (TYLENOL) 325 MG tablet Take 2 tablets (650 mg total) by mouth every 6 (six) hours as needed for mild pain (or Fever >/= 101).    . metoCLOPramide (REGLAN) 5 MG tablet Take 5 mg by mouth every 8 (eight) hours as needed for nausea or vomiting.        Objective: Vital: Vitals:   06/28/20 0800 06/28/20 0815 06/28/20 0823 06/28/20 0830  BP: 114/69 114/69  135/79  Pulse: (!) 101 (!) 103  97  Resp:  19    Temp:      TempSrc:  SpO2: 92% 94%  95%  Weight:   65.8 kg   Height:   4\' 6"  (1.372 m)    I/Os: I/O last 3 completed shifts: In: 1200 [IV Piggyback:1200] Out: 1000 [Urine:1000]  Physical Exam:  General: Patient is in no apparent distress Lungs: Normal respiratory effort, chest expands symmetrically. GI: Colostomy and ileostomy bags intact, stoma appears viable.  Abdomen is fairly soft. Ext: lower extremities contracted  Lab Results: Recent Labs    06/26/20 2315 06/28/20 0830  WBC 18.9* 10.6*  HGB 14.4 12.5*  HCT 43.8 39.7   Recent Labs    06/26/20 2315  NA 137  K 3.4*  CL 98  CO2 24  GLUCOSE 120*  BUN 39*  CREATININE 1.80*  CALCIUM 9.4   Recent Labs    06/27/20 1938  INR 1.0   No results for input(s): LABURIN in the last 72 hours. Results for orders placed or performed during the hospital encounter of 06/27/20  Respiratory Panel by RT PCR (Flu A&B, Covid) - Nasopharyngeal Swab     Status: None   Collection Time: 06/26/20 11:16  PM   Specimen: Nasopharyngeal Swab  Result Value Ref Range Status   SARS Coronavirus 2 by RT PCR NEGATIVE NEGATIVE Final    Comment: (NOTE) SARS-CoV-2 target nucleic acids are NOT DETECTED.  The SARS-CoV-2 RNA is generally detectable in upper respiratoy specimens during the acute phase of infection. The lowest concentration of SARS-CoV-2 viral copies this assay can detect is 131 copies/mL. A negative result does not preclude SARS-Cov-2 infection and should not be used as the sole basis for treatment or other patient management decisions. A negative result may occur with  improper specimen collection/handling, submission of specimen other than nasopharyngeal swab, presence of viral mutation(s) within the areas targeted by this assay, and inadequate number of viral copies (<131 copies/mL). A negative result must be combined with clinical observations, patient history, and epidemiological information. The expected result is Negative.  Fact Sheet for Patients:  PinkCheek.be  Fact Sheet for Healthcare Providers:  GravelBags.it  This test is no t yet approved or cleared by the [place] FDA and  has been authorized for detection and/or diagnosis of SARS-CoV-2 by FDA under an Emergency Use Authorization (EUA). This EUA will remain  in effect (meaning this test can be used) for the duration of the COVID-19 declaration under Section 564(b)(1) of the Act, 21 U.S.C. section 360bbb-3(b)(1), unless the authorization is terminated or revoked sooner.     Influenza A by PCR NEGATIVE NEGATIVE Final   Influenza B by PCR NEGATIVE NEGATIVE Final    Comment: (NOTE) The Xpert Xpress SARS-CoV-2/FLU/RSV assay is intended as an aid in  the diagnosis of influenza from Nasopharyngeal swab specimens and  should not be used as a sole basis for treatment. Nasal washings and  aspirates are unacceptable for Xpert Xpress SARS-CoV-2/FLU/RSV   testing.  Fact Sheet for Patients: PinkCheek.be  Fact Sheet for Healthcare Providers: GravelBags.it  This test is not yet approved or cleared by the [place] FDA and  has been authorized for detection and/or diagnosis of SARS-CoV-2 by  FDA under an Emergency Use Authorization (EUA). This EUA will remain  in effect (meaning this test can be used) for the duration of the  Covid-19 declaration under Section 564(b)(1) of the Act, 21  U.S.C. section 360bbb-3(b)(1), unless the authorization is  terminated or revoked. Performed at [facility], [address], [place], [place] [address]   Culture, blood (single)  Status: None (Preliminary result)   Collection Time: 06/27/20  3:11 PM   Specimen: BLOOD  Result Value Ref Range Status   Specimen Description BLOOD LEFT AC  Final   Special Requests   Final    BOTTLES DRAWN AEROBIC AND ANAEROBIC Blood Culture results may not be optimal due to an excessive volume of blood received in culture bottles   Culture   Final    NO GROWTH < 24 HOURS Performed at [facility], [address] [place], [place] [address]    Report Status PENDING  Incomplete    Studies/Results: CT ABDOMEN PELVIS WO CONTRAST  Addendum Date: 06/27/2020   ADDENDUM REPORT: 06/27/2020 15:31 ADDENDUM: Note that while the calculus in the proximal left ureter has not been seen previously, the degree of hydronephrosis on the left remains chronic. The apparent effusion in the left and to a lesser extent right hip joints also appear chronic and grossly stable with respect to thickening in these areas. Multiple calculi in the kidneys likewise are chronic. Electronically Signed   By: Lowella Grip III M.D.   On: 06/27/2020 15:31   Result Date: 06/27/2020 CLINICAL DATA:  Abdominal pain EXAM: CT ABDOMEN AND PELVIS WITHOUT CONTRAST TECHNIQUE: Multidetector CT imaging of the abdomen and pelvis was  performed following the standard protocol without oral or IV contrast. COMPARISON:  December 22, 2019 FINDINGS: Lower chest: There is mild bibasilar atelectasis. No edema or airspace opacity in the lung bases. Hepatobiliary: No focal liver lesions are appreciable on this noncontrast enhanced study. Gallbladder wall is not appreciably thickened. There is no biliary duct dilatation. Pancreas: There is no pancreatic mass or inflammatory focus. Spleen: No splenic lesions evident. A small accessory spleen is noted anteriorly. Adrenals/Urinary Tract: Adrenals bilaterally appear normal. There is severe hydronephrosis on the left. There is no appreciable hydronephrosis on the right. There is an apparent cyst in the upper pole the right kidney measuring 2.5 x 1.4 cm. There are calcifications throughout the right kidney both more centrally and peripherally. There is a staghorn type calculus in upper pole calices on the right measuring 1.9 x 1.4 cm. There is a calculus in the mid right kidney peripherally measuring 1.1 x 1.1 cm. Several smaller calcification are noted in the right kidney as well, stable. There is calcification in the lower pole of the left kidney measuring 1.9 x 1.5 cm. This calculus is a staghorn type calculus in a lower pole calyx with nearby scarring. Scattered subcentimeter calculi noted. There is apparent stenosis in the proximal left ureter slightly distal to the left ureteropelvic junction. A focal calculus in this area measures 0.8 x 0.4 cm. No other ureteral calculi are evident on either side. Urinary bladder is midline with wall thickness within normal limits. There has been apparent urinary diversion to an ileal conduit located in the right pelvis. Right ileostomy is patent. Stomach/Bowel: Left lower quadrant colostomy noted with patency in this area. There is moderate stool in colon. There is no appreciable bowel wall or mesenteric thickening. There is no evident bowel obstruction. There is no free  air or portal venous air. There is mild fatty infiltration in the ileocecal valve. Vascular/Lymphatic: No abdominal aortic aneurysm. No appreciable vascular lesions are evident on this noncontrast enhanced study. No adenopathy is appreciable in the abdomen or pelvis. Reproductive: Prostate and seminal vesicles appear unremarkable. Other: No periappendiceal region inflammation. No abscess or ascites evident in the abdomen or pelvis. Musculoskeletal: Chronic hip dislocation on the left with dysplasia in  the left hip joint region. Remodeling of the left femoral head noted with shallow acetabulum. Changes of spina bifida present and stable. Extensive spinal dysraphism is noted throughout the lumbar region with ankylosis at L3-4, stable. No blastic or lytic bone lesions are evident. There is a advanced muscle atrophy throughout the pelvis and thigh regions. There is soft tissue thickening in each hip joint, more severe on the left than on the right, likely due to hip joint effusions. This appearance is stable compared to most recent study. IMPRESSION: 1. Severe hydronephrosis on the left. There is an 8 x 4 mm calculus in the proximal left ureter with an apparent ureteral stricture on the left immediately distal to this calculus. There are areas of scarring in each kidney with multiple calculi, largely due to staghorn type calculi in multiple calices. No appreciable hydronephrosis on the right evident. 2. Left lower quadrant ostomy, patent. Apparent previous urinary diversion with ileal conduit in the right pelvis with right ostomy which is patent. 3. No bowel obstruction. No abscess in the abdomen or pelvis. No periappendiceal region inflammation. 4. Apparent hip joint effusions bilaterally, significantly larger on the left than on the right. Infected joint effusion on the left cannot be excluded. No air in this region, however. 5.  Chronic appearing hip dislocation on the left with remodeling. 6. Spinal dysraphism with  spina bifida occulta. Underlying osteoporosis. Advanced muscle atrophy throughout pelvis and thigh regions. Electronically Signed: By: Lowella Grip III M.D. On: 06/27/2020 14:45    Assessment: Challenging urinary track with a chronic left proximal ureteral stricture.  Bilateral kidney stones and a stone fragment that appears to be down in the left UPJ at the level of the stricture.  The patient clearly has left-sided chronic hydroureteronephrosis.  He does not appear to be in a significant amount of pain, he is still complaining of diffuse abdominal tenderness and mild nausea.  Discussion yesterday with interventional radiology about whether or not to place a percutaneous nephrostomy tube on his left.  This would be the only option for decompressing his left urinary tract and resolving any symptoms related to the kidney stones.  I am not sure that this is necessary given that he has chronic dilation on that side.  His white count is better and he seems to be improving. His appetite seems to be returning.  At this point, I would favor conservative management.  Plan: Continue antibiotics and conservative management.  The patient will need close follow-up with Dr. Gillie Manners at [facility] urology for ongoing kidney stone management once he improves from UTI.  Louis Meckel, MD Urology 06/28/2020, 9:01 AM

## 2020-06-28 NOTE — Progress Notes (Signed)
PROGRESS NOTE    [name]  TML:[id number] DOB: [birth year] DOA: 06/27/2020 PCP: Idelle Crouch, MD   Brief Narrative: 54 year old male with complex medical history including spina bifida, neurogenic bladder requiring ileal conduit urinary diversion status post recent revision, chronic bilateral hydronephrosis, known bilateral nephrolithiasis, colostomy, hypertension, hyperlipidemia, GERD, CKD stage III, esophageal ulcer who presents with abdominal pain.  He was found to have a nearly obstructing stone in the left UPJ.  Patient was seen by urology who felt that his hydronephrosis.  Chronic.  Patient is not a significant amount of pain.  He is still complaining some abdominal tenderness and mild nausea.  Urology and interventional radiology discussed about need for placement of a percutaneous nephrostomy yesterday.  At this time we are proceeding with conservative management and antibiotics.  The patient appears to be responding clinically.  Urology follow-up appreciated.   Assessment & Plan:   Principal Problem:   Complicated UTI (urinary tract infection) Active Problems:   Sepsis ([place])   Hypokalemia   GERD (gastroesophageal reflux disease)   Hyperlipidemia   Left ureteral stone   HTN (hypertension)   Acute renal failure superimposed on stage 3a chronic kidney disease ([place])   Spina bifida ([place])   Hydronephrosis   Kidney stone  Sepsis due to complicated UTI (urinary tract infection):  Patient meets criteria for sepsis with leukocytosis and tachycardia with heart rate of 102.  Lactic acid is normal.   Currently hemodynamically stable.  Patient has hydronephrosis and new left ureteral stone.   ED physician consulted IR Dr. Anselm Pancoast.  We also consulted Dr. Lovena Neighbours of urology.   Plan: Conservative management with IV antibiotics and pain control Maintenance IV fluids Follow cultures to guide antibiotic choice Conservative management at this time Appreciate urology  follow-up  Hypokalemia: Potassium 3.4 -Repleted potassium -Check magnesium level  GERD (gastroesophageal reflux disease) -Protonix  Hyperlipidemia -Crestor  Left ureteral stone, hydronephrosis and kidney stone -urology is consulted, see above recommendations, on man  HTN (hypertension) -Hold HCTZ due to sepsis  Acute renal failure superimposed on stage 3a chronic kidney disease ([place]): Recent baseline creatinine 1.22 on 12/24/2019.   His creatinine is 1.8, BUN 39 on admission Likely due to UTI and stone Improving over interval Plan: -daily BMP -Hold HCTZ -IVF as above  Spina bifida ([place]) -s/p urostomy and colostomy.   DVT prophylaxis: SQ heparin Code Status: Full code Family Communication: None today Disposition Plan: Status is: Inpatient  Remains inpatient appropriate because:Inpatient level of care appropriate due to severity of illness   Dispo: The patient is from: Home              Anticipated d/c is to: Home              Anticipated d/c date is: 2 days              Patient currently is not medically stable to d/c.   Remains on IV antibiotics for complicated UTI secondary to nephrolithiasis.  Urology following.  Disposition likely in 2 days.   Consultants:   Urology  Procedures:   none  Antimicrobials:   Cefipime   Subjective: Seen and examined.  Complaining of diffuse abdominal pain but feels it is improving over interval.  Objective: Vitals:   06/28/20 1058 06/28/20 1058 06/28/20 1130 06/28/20 1200  BP: 132/75 132/75 140/64 130/72  Pulse: 93 99 (!) 101 91  Resp: 18 17  18   Temp: 100.1 F (37.8 C)     TempSrc: Axillary  SpO2: 94% 96% 95% 93%  Weight:      Height:        Intake/Output Summary (Last 24 hours) at 06/28/2020 1339 Last data filed at 06/28/2020 1052 Gross per 24 hour  Intake 1200 ml  Output 2950 ml  Net -1750 ml   Filed Weights   06/26/20 2305 06/28/20 0823  Weight: 65.8 kg 65.8 kg     Examination:  General exam: No acute distress.  Appears chronically ill Respiratory system: Clear to auscultation. Respiratory effort normal. Cardiovascular system: S1 & S2 heard, RRR. No JVD, murmurs, rubs, gallops or clicks. No pedal edema. Gastrointestinal system: Soft, nondistended, tender to palpation periumbilical region, status post urostomy and colostomy Central nervous system: Alert and oriented. No focal neurological deficits. Extremities: Symmetric 5 x 5 power. Skin: No rashes, lesions or ulcers Psychiatry: Judgement and insight appear normal. Mood & affect appropriate.     Data Reviewed: I have personally reviewed following labs and imaging studies  CBC: Recent Labs  Lab 06/26/20 2315 06/28/20 0830  WBC 18.9* 10.6*  HGB 14.4 12.5*  HCT 43.8 39.7  MCV 84.7 88.4  PLT 295 202   Basic Metabolic Panel: Recent Labs  Lab 06/26/20 2315 06/28/20 0830  NA 137 141  K 3.4* 3.2*  CL 98 103  CO2 24 24  GLUCOSE 120* 68*  BUN 39* 24*  CREATININE 1.80* 1.52*  CALCIUM 9.4 8.8*  MG  --  2.1   GFR: Estimated Creatinine Clearance: 38.6 mL/min (A) (by C-G formula based on SCr of 1.52 mg/dL (H)). Liver Function Tests: Recent Labs  Lab 06/26/20 2315  AST 26  ALT 20  ALKPHOS 75  BILITOT 0.7  PROT 7.9  ALBUMIN 4.0   Recent Labs  Lab 06/26/20 2315  LIPASE 44   No results for input(s): AMMONIA in the last 168 hours. Coagulation Profile: Recent Labs  Lab 06/27/20 1938  INR 1.0   Cardiac Enzymes: No results for input(s): CKTOTAL, CKMB, CKMBINDEX, TROPONINI in the last 168 hours. BNP (last 3 results) No results for input(s): PROBNP in the last 8760 hours. HbA1C: No results for input(s): HGBA1C in the last 72 hours. CBG: No results for input(s): GLUCAP in the last 168 hours. Lipid Profile: No results for input(s): CHOL, HDL, LDLCALC, TRIG, CHOLHDL, LDLDIRECT in the last 72 hours. Thyroid Function Tests: No results for input(s): TSH, T4TOTAL, FREET4,  T3FREE, THYROIDAB in the last 72 hours. Anemia Panel: No results for input(s): VITAMINB12, FOLATE, FERRITIN, TIBC, IRON, RETICCTPCT in the last 72 hours. Sepsis Labs: Recent Labs  Lab 06/26/20 2315 06/27/20 1938  PROCALCITON  --  1.64  LATICACIDVEN 1.5  --     Recent Results (from the past 240 hour(s))  Respiratory Panel by RT PCR (Flu A&B, Covid) - Nasopharyngeal Swab     Status: None   Collection Time: 06/26/20 11:16 PM   Specimen: Nasopharyngeal Swab  Result Value Ref Range Status   SARS Coronavirus 2 by RT PCR NEGATIVE NEGATIVE Final    Comment: (NOTE) SARS-CoV-2 target nucleic acids are NOT DETECTED.  The SARS-CoV-2 RNA is generally detectable in upper respiratoy specimens during the acute phase of infection. The lowest concentration of SARS-CoV-2 viral copies this assay can detect is 131 copies/mL. A negative result does not preclude SARS-Cov-2 infection and should not be used as the sole basis for treatment or other patient management decisions. A negative result may occur with  improper specimen collection/handling, submission of specimen other than nasopharyngeal swab, presence of viral mutation(s)  within the areas targeted by this assay, and inadequate number of viral copies (<131 copies/mL). A negative result must be combined with clinical observations, patient history, and epidemiological information. The expected result is Negative.  Fact Sheet for Patients:  PinkCheek.be  Fact Sheet for Healthcare Providers:  GravelBags.it  This test is no t yet approved or cleared by the [place] FDA and  has been authorized for detection and/or diagnosis of SARS-CoV-2 by FDA under an Emergency Use Authorization (EUA). This EUA will remain  in effect (meaning this test can be used) for the duration of the COVID-19 declaration under Section 564(b)(1) of the Act, 21 U.S.C. section 360bbb-3(b)(1), unless the  authorization is terminated or revoked sooner.     Influenza A by PCR NEGATIVE NEGATIVE Final   Influenza B by PCR NEGATIVE NEGATIVE Final    Comment: (NOTE) The Xpert Xpress SARS-CoV-2/FLU/RSV assay is intended as an aid in  the diagnosis of influenza from Nasopharyngeal swab specimens and  should not be used as a sole basis for treatment. Nasal washings and  aspirates are unacceptable for Xpert Xpress SARS-CoV-2/FLU/RSV  testing.  Fact Sheet for Patients: PinkCheek.be  Fact Sheet for Healthcare Providers: GravelBags.it  This test is not yet approved or cleared by the [place] FDA and  has been authorized for detection and/or diagnosis of SARS-CoV-2 by  FDA under an Emergency Use Authorization (EUA). This EUA will remain  in effect (meaning this test can be used) for the duration of the  Covid-19 declaration under Section 564(b)(1) of the Act, 21  U.S.C. section 360bbb-3(b)(1), unless the authorization is  terminated or revoked. Performed at [facility], [address] [place],  [address]   Culture, blood (single)     Status: None (Preliminary result)   Collection Time: 06/27/20  3:11 PM   Specimen: BLOOD  Result Value Ref Range Status   Specimen Description BLOOD LEFT AC  Final   Special Requests   Final    BOTTLES DRAWN AEROBIC AND ANAEROBIC Blood Culture results may not be optimal due to an excessive volume of blood received in culture bottles   Culture   Final    NO GROWTH < 24 HOURS Performed at [facility], [address], [place],  [address]    Report Status PENDING  Incomplete         Radiology Studies: CT ABDOMEN PELVIS WO CONTRAST  Addendum Date: 06/27/2020   ADDENDUM REPORT: 06/27/2020 15:31 ADDENDUM: Note that while the calculus in the proximal left ureter has not been seen previously, the degree of hydronephrosis on the left remains chronic. The apparent  effusion in the left and to a lesser extent right hip joints also appear chronic and grossly stable with respect to thickening in these areas. Multiple calculi in the kidneys likewise are chronic. Electronically Signed   By: Lowella Grip III M.D.   On: 06/27/2020 15:31   Result Date: 06/27/2020 CLINICAL DATA:  Abdominal pain EXAM: CT ABDOMEN AND PELVIS WITHOUT CONTRAST TECHNIQUE: Multidetector CT imaging of the abdomen and pelvis was performed following the standard protocol without oral or IV contrast. COMPARISON:  December 22, 2019 FINDINGS: Lower chest: There is mild bibasilar atelectasis. No edema or airspace opacity in the lung bases. Hepatobiliary: No focal liver lesions are appreciable on this noncontrast enhanced study. Gallbladder wall is not appreciably thickened. There is no biliary duct dilatation. Pancreas: There is no pancreatic mass or inflammatory focus. Spleen: No splenic lesions evident. A small accessory spleen  is noted anteriorly. Adrenals/Urinary Tract: Adrenals bilaterally appear normal. There is severe hydronephrosis on the left. There is no appreciable hydronephrosis on the right. There is an apparent cyst in the upper pole the right kidney measuring 2.5 x 1.4 cm. There are calcifications throughout the right kidney both more centrally and peripherally. There is a staghorn type calculus in upper pole calices on the right measuring 1.9 x 1.4 cm. There is a calculus in the mid right kidney peripherally measuring 1.1 x 1.1 cm. Several smaller calcification are noted in the right kidney as well, stable. There is calcification in the lower pole of the left kidney measuring 1.9 x 1.5 cm. This calculus is a staghorn type calculus in a lower pole calyx with nearby scarring. Scattered subcentimeter calculi noted. There is apparent stenosis in the proximal left ureter slightly distal to the left ureteropelvic junction. A focal calculus in this area measures 0.8 x 0.4 cm. No other ureteral calculi  are evident on either side. Urinary bladder is midline with wall thickness within normal limits. There has been apparent urinary diversion to an ileal conduit located in the right pelvis. Right ileostomy is patent. Stomach/Bowel: Left lower quadrant colostomy noted with patency in this area. There is moderate stool in colon. There is no appreciable bowel wall or mesenteric thickening. There is no evident bowel obstruction. There is no free air or portal venous air. There is mild fatty infiltration in the ileocecal valve. Vascular/Lymphatic: No abdominal aortic aneurysm. No appreciable vascular lesions are evident on this noncontrast enhanced study. No adenopathy is appreciable in the abdomen or pelvis. Reproductive: Prostate and seminal vesicles appear unremarkable. Other: No periappendiceal region inflammation. No abscess or ascites evident in the abdomen or pelvis. Musculoskeletal: Chronic hip dislocation on the left with dysplasia in the left hip joint region. Remodeling of the left femoral head noted with shallow acetabulum. Changes of spina bifida present and stable. Extensive spinal dysraphism is noted throughout the lumbar region with ankylosis at L3-4, stable. No blastic or lytic bone lesions are evident. There is a advanced muscle atrophy throughout the pelvis and thigh regions. There is soft tissue thickening in each hip joint, more severe on the left than on the right, likely due to hip joint effusions. This appearance is stable compared to most recent study. IMPRESSION: 1. Severe hydronephrosis on the left. There is an 8 x 4 mm calculus in the proximal left ureter with an apparent ureteral stricture on the left immediately distal to this calculus. There are areas of scarring in each kidney with multiple calculi, largely due to staghorn type calculi in multiple calices. No appreciable hydronephrosis on the right evident. 2. Left lower quadrant ostomy, patent. Apparent previous urinary diversion with ileal  conduit in the right pelvis with right ostomy which is patent. 3. No bowel obstruction. No abscess in the abdomen or pelvis. No periappendiceal region inflammation. 4. Apparent hip joint effusions bilaterally, significantly larger on the left than on the right. Infected joint effusion on the left cannot be excluded. No air in this region, however. 5.  Chronic appearing hip dislocation on the left with remodeling. 6. Spinal dysraphism with spina bifida occulta. Underlying osteoporosis. Advanced muscle atrophy throughout pelvis and thigh regions. Electronically Signed: By: Lowella Grip III M.D. On: 06/27/2020 14:45        Scheduled Meds: . heparin  5,000 Units Subcutaneous Q8H  . multivitamin with minerals  1 tablet Oral Daily  . pantoprazole  40 mg Oral Daily  . potassium  chloride  30 mEq Oral Once  . rosuvastatin  10 mg Oral QHS  . sucralfate  1 g Oral TID   Continuous Infusions: . ceFEPime (MAXIPIME) IV Stopped (06/28/20 1136)     LOS: 1 day    Time spent: 25 minutes    Sidney Ace, MD [facility] Pager [phone]  If 7PM-7AM, please contact night-coverage 06/28/2020, 1:39 PM

## 2020-06-28 NOTE — ED Notes (Signed)
Pt's colostomy bag changed at this time. Pt had large amount of formed small ball like stool dark in color.

## 2020-06-29 ENCOUNTER — Encounter: Payer: Self-pay | Admitting: Internal Medicine

## 2020-06-29 DIAGNOSIS — N132 Hydronephrosis with renal and ureteral calculous obstruction: Secondary | ICD-10-CM

## 2020-06-29 DIAGNOSIS — N178 Other acute kidney failure: Secondary | ICD-10-CM

## 2020-06-29 DIAGNOSIS — N390 Urinary tract infection, site not specified: Secondary | ICD-10-CM | POA: Diagnosis not present

## 2020-06-29 DIAGNOSIS — N1831 Chronic kidney disease, stage 3a: Secondary | ICD-10-CM

## 2020-06-29 LAB — URINE CULTURE

## 2020-06-29 MED ORDER — SODIUM CHLORIDE 0.9 % IV SOLN: INTRAVENOUS | Status: DC

## 2020-06-29 MED ORDER — POTASSIUM CHLORIDE CRYS ER 20 MEQ PO TBCR
40.0000 meq | EXTENDED_RELEASE_TABLET | Freq: Once | ORAL | Status: AC
  Administered 2020-06-29: 40 meq via ORAL
  Filled 2020-06-29: qty 2

## 2020-06-29 NOTE — Progress Notes (Signed)
Urology Inpatient Progress Report  UTI (urinary tract infection) [N39.0] Urinary tract infection with hematuria, site unspecified [N39.0, R31.9] Sepsis, due to unspecified organism, unspecified whether acute organ dysfunction present ([place]) [A41.9]    Intv/Subj: Feels much better this AM.  Not completely normal, but much happier and less abdominal pain.  Eating without nausea.  Principal Problem:   Complicated UTI (urinary tract infection) Active Problems:   Sepsis ([place])   Hypokalemia   GERD (gastroesophageal reflux disease)   Hyperlipidemia   Left ureteral stone   HTN (hypertension)   Acute renal failure superimposed on stage 3a chronic kidney disease ([place])   Spina bifida ([place])   Hydronephrosis   Kidney stone  Current Facility-Administered Medications  Medication Dose Route Frequency Provider Last Rate Last Admin  . acetaminophen (TYLENOL) tablet 650 mg  650 mg Oral Q6H PRN Ivor Costa, MD      . ceFEPIme (MAXIPIME) 2 g in sodium chloride 0.9 % 100 mL IVPB  2 g Intravenous Q12H Ivor Costa, MD 200 mL/hr at 06/28/20 2230 2 g at 06/28/20 2230  . dicyclomine (BENTYL) capsule 10 mg  10 mg Oral TID PRN Ivor Costa, MD      . fentaNYL (SUBLIMAZE) injection 12.5 mcg  12.5 mcg Intravenous Q3H PRN Ivor Costa, MD      . heparin injection 5,000 Units  5,000 Units Subcutaneous Q8H Ivor Costa, MD   5,000 Units at 06/29/20 0550  . multivitamin with minerals tablet 1 tablet  1 tablet Oral Daily Ivor Costa, MD   1 tablet at 06/28/20 1053  . ondansetron (ZOFRAN) injection 4 mg  4 mg Intravenous Q8H PRN Ivor Costa, MD      . pantoprazole (PROTONIX) EC tablet 40 mg  40 mg Oral Daily Ivor Costa, MD   40 mg at 06/28/20 1053  . potassium chloride SA (KLOR-CON) CR tablet 30 mEq  30 mEq Oral Once Ivor Costa, MD      . rosuvastatin (CRESTOR) tablet 10 mg  10 mg Oral QHS Ivor Costa, MD   10 mg at 06/28/20 2216  . sucralfate (CARAFATE) tablet 1 g  1 g Oral TID Ivor Costa, MD   1 g at 06/28/20 2217      Objective: Vital: Vitals:   06/28/20 2024 06/28/20 2156 06/29/20 0036 06/29/20 0533  BP: (!) 143/89 126/85 137/75 121/73  Pulse: 89 90 87 95  Resp: 16 20 20 20   Temp: 98.5 F (36.9 C) 98.7 F (37.1 C) 99.3 F (37.4 C) 98.5 F (36.9 C)  TempSrc: Oral  Oral Oral  SpO2: 100% 100% 96% 94%  Weight:      Height:       I/Os: I/O last 3 completed shifts: In: 1200 [IV Piggyback:1200] Out: 3600 [Urine:3600]  Physical Exam:  General: Patient is in no apparent distress Lungs: Normal respiratory effort, chest expands symmetrically. GI: Colostomy and ileostomy bags intact, stoma appears viable.  Abdomen is fairly soft. Ext: lower extremities contracted  Lab Results: Recent Labs    06/26/20 2315 06/28/20 0830  WBC 18.9* 10.6*  HGB 14.4 12.5*  HCT 43.8 39.7   Recent Labs    06/26/20 2315 06/28/20 0830  NA 137 141  K 3.4* 3.2*  CL 98 103  CO2 24 24  GLUCOSE 120* 68*  BUN 39* 24*  CREATININE 1.80* 1.52*  CALCIUM 9.4 8.8*   Recent Labs    06/27/20 1938  INR 1.0   No results for input(s): LABURIN in the last 72 hours. Results for  orders placed or performed during the hospital encounter of 06/27/20  Respiratory Panel by RT PCR (Flu A&B, Covid) - Nasopharyngeal Swab     Status: None   Collection Time: 06/26/20 11:16 PM   Specimen: Nasopharyngeal Swab  Result Value Ref Range Status   SARS Coronavirus 2 by RT PCR NEGATIVE NEGATIVE Final    Comment: (NOTE) SARS-CoV-2 target nucleic acids are NOT DETECTED.  The SARS-CoV-2 RNA is generally detectable in upper respiratoy specimens during the acute phase of infection. The lowest concentration of SARS-CoV-2 viral copies this assay can detect is 131 copies/mL. A negative result does not preclude SARS-Cov-2 infection and should not be used as the sole basis for treatment or other patient management decisions. A negative result may occur with  improper specimen collection/handling, submission of specimen other than  nasopharyngeal swab, presence of viral mutation(s) within the areas targeted by this assay, and inadequate number of viral copies (<131 copies/mL). A negative result must be combined with clinical observations, patient history, and epidemiological information. The expected result is Negative.  Fact Sheet for Patients:  PinkCheek.be  Fact Sheet for Healthcare Providers:  GravelBags.it  This test is no t yet approved or cleared by the [place] FDA and  has been authorized for detection and/or diagnosis of SARS-CoV-2 by FDA under an Emergency Use Authorization (EUA). This EUA will remain  in effect (meaning this test can be used) for the duration of the COVID-19 declaration under Section 564(b)(1) of the Act, 21 U.S.C. section 360bbb-3(b)(1), unless the authorization is terminated or revoked sooner.     Influenza A by PCR NEGATIVE NEGATIVE Final   Influenza B by PCR NEGATIVE NEGATIVE Final    Comment: (NOTE) The Xpert Xpress SARS-CoV-2/FLU/RSV assay is intended as an aid in  the diagnosis of influenza from Nasopharyngeal swab specimens and  should not be used as a sole basis for treatment. Nasal washings and  aspirates are unacceptable for Xpert Xpress SARS-CoV-2/FLU/RSV  testing.  Fact Sheet for Patients: PinkCheek.be  Fact Sheet for Healthcare Providers: GravelBags.it  This test is not yet approved or cleared by the [place] FDA and  has been authorized for detection and/or diagnosis of SARS-CoV-2 by  FDA under an Emergency Use Authorization (EUA). This EUA will remain  in effect (meaning this test can be used) for the duration of the  Covid-19 declaration under Section 564(b)(1) of the Act, 21  U.S.C. section 360bbb-3(b)(1), unless the authorization is  terminated or revoked. Performed at [facility], [address] [place], [place]  [address]   Culture, blood (single)     Status: None (Preliminary result)   Collection Time: 06/27/20  3:11 PM   Specimen: BLOOD  Result Value Ref Range Status   Specimen Description   Final    BLOOD LEFT AC Performed at [facility], [address], [place], [place] [address]    Special Requests   Final    BOTTLES DRAWN AEROBIC AND ANAEROBIC Blood Culture results may not be optimal due to an excessive volume of blood received in culture bottles Performed at [facility], [address] [place],  [address]    Culture  Setup Time   Final    Organism ID to follow [place] TO, READ BACK BY AND VERIFIED WITH: ALEX CHAPPELL @2239  06/28/2020 TTG    Culture GRAM POSITIVE COCCI  Final   Report Status PENDING  Incomplete  Blood Culture ID Panel (Reflexed)  Status: Abnormal   Collection Time: 06/27/20  3:11 PM  Result Value Ref Range Status   Enterococcus faecalis NOT DETECTED NOT DETECTED Final   Enterococcus Faecium NOT DETECTED NOT DETECTED Final   Listeria monocytogenes NOT DETECTED NOT DETECTED Final   Staphylococcus species DETECTED (A) NOT DETECTED Final    Comment: CRITICAL RESULT CALLED TO, READ BACK BY AND VERIFIED WITH: ALEX CHAPPELL @2239  06/28/2020 TTG    Staphylococcus aureus (BCID) NOT DETECTED NOT DETECTED Final   Staphylococcus epidermidis DETECTED (A) NOT DETECTED Final    Comment: CRITICAL RESULT CALLED TO, READ BACK BY AND VERIFIED WITH: ALEX CHAPPELL @2239  06/28/2020 TTG    Staphylococcus lugdunensis NOT DETECTED NOT DETECTED Final   Streptococcus species NOT DETECTED NOT DETECTED Final   Streptococcus agalactiae NOT DETECTED NOT DETECTED Final   Streptococcus pneumoniae NOT DETECTED NOT DETECTED Final   Streptococcus pyogenes NOT DETECTED NOT DETECTED Final   A.calcoaceticus-baumannii NOT DETECTED NOT DETECTED Final   Bacteroides fragilis NOT DETECTED NOT DETECTED Final    Enterobacterales NOT DETECTED NOT DETECTED Final   Enterobacter cloacae complex NOT DETECTED NOT DETECTED Final   Escherichia coli NOT DETECTED NOT DETECTED Final   Klebsiella aerogenes NOT DETECTED NOT DETECTED Final   Klebsiella oxytoca NOT DETECTED NOT DETECTED Final   Klebsiella pneumoniae NOT DETECTED NOT DETECTED Final   Proteus species NOT DETECTED NOT DETECTED Final   Salmonella species NOT DETECTED NOT DETECTED Final   Serratia marcescens NOT DETECTED NOT DETECTED Final   Haemophilus influenzae NOT DETECTED NOT DETECTED Final   Neisseria meningitidis NOT DETECTED NOT DETECTED Final   Pseudomonas aeruginosa NOT DETECTED NOT DETECTED Final   Stenotrophomonas maltophilia NOT DETECTED NOT DETECTED Final   Candida albicans NOT DETECTED NOT DETECTED Final   Candida auris NOT DETECTED NOT DETECTED Final   Candida glabrata NOT DETECTED NOT DETECTED Final   Candida krusei NOT DETECTED NOT DETECTED Final   Candida parapsilosis NOT DETECTED NOT DETECTED Final   Candida tropicalis NOT DETECTED NOT DETECTED Final   Cryptococcus neoformans/gattii NOT DETECTED NOT DETECTED Final   Methicillin resistance mecA/C NOT DETECTED NOT DETECTED Final    Comment: Performed at [facility], [address] [place], [place] [address]    Studies/Results: CT ABDOMEN PELVIS WO CONTRAST  Addendum Date: 06/27/2020   ADDENDUM REPORT: 06/27/2020 15:31 ADDENDUM: Note that while the calculus in the proximal left ureter has not been seen previously, the degree of hydronephrosis on the left remains chronic. The apparent effusion in the left and to a lesser extent right hip joints also appear chronic and grossly stable with respect to thickening in these areas. Multiple calculi in the kidneys likewise are chronic. Electronically Signed   By: Lowella Grip III M.D.   On: 06/27/2020 15:31   Result Date: 06/27/2020 CLINICAL DATA:  Abdominal pain EXAM: CT ABDOMEN AND PELVIS WITHOUT CONTRAST TECHNIQUE:  Multidetector CT imaging of the abdomen and pelvis was performed following the standard protocol without oral or IV contrast. COMPARISON:  December 22, 2019 FINDINGS: Lower chest: There is mild bibasilar atelectasis. No edema or airspace opacity in the lung bases. Hepatobiliary: No focal liver lesions are appreciable on this noncontrast enhanced study. Gallbladder wall is not appreciably thickened. There is no biliary duct dilatation. Pancreas: There is no pancreatic mass or inflammatory focus. Spleen: No splenic lesions evident. A small accessory spleen is noted anteriorly. Adrenals/Urinary Tract: Adrenals bilaterally appear normal. There is severe hydronephrosis on the left. There is no appreciable hydronephrosis on the right.  There is an apparent cyst in the upper pole the right kidney measuring 2.5 x 1.4 cm. There are calcifications throughout the right kidney both more centrally and peripherally. There is a staghorn type calculus in upper pole calices on the right measuring 1.9 x 1.4 cm. There is a calculus in the mid right kidney peripherally measuring 1.1 x 1.1 cm. Several smaller calcification are noted in the right kidney as well, stable. There is calcification in the lower pole of the left kidney measuring 1.9 x 1.5 cm. This calculus is a staghorn type calculus in a lower pole calyx with nearby scarring. Scattered subcentimeter calculi noted. There is apparent stenosis in the proximal left ureter slightly distal to the left ureteropelvic junction. A focal calculus in this area measures 0.8 x 0.4 cm. No other ureteral calculi are evident on either side. Urinary bladder is midline with wall thickness within normal limits. There has been apparent urinary diversion to an ileal conduit located in the right pelvis. Right ileostomy is patent. Stomach/Bowel: Left lower quadrant colostomy noted with patency in this area. There is moderate stool in colon. There is no appreciable bowel wall or mesenteric thickening.  There is no evident bowel obstruction. There is no free air or portal venous air. There is mild fatty infiltration in the ileocecal valve. Vascular/Lymphatic: No abdominal aortic aneurysm. No appreciable vascular lesions are evident on this noncontrast enhanced study. No adenopathy is appreciable in the abdomen or pelvis. Reproductive: Prostate and seminal vesicles appear unremarkable. Other: No periappendiceal region inflammation. No abscess or ascites evident in the abdomen or pelvis. Musculoskeletal: Chronic hip dislocation on the left with dysplasia in the left hip joint region. Remodeling of the left femoral head noted with shallow acetabulum. Changes of spina bifida present and stable. Extensive spinal dysraphism is noted throughout the lumbar region with ankylosis at L3-4, stable. No blastic or lytic bone lesions are evident. There is a advanced muscle atrophy throughout the pelvis and thigh regions. There is soft tissue thickening in each hip joint, more severe on the left than on the right, likely due to hip joint effusions. This appearance is stable compared to most recent study. IMPRESSION: 1. Severe hydronephrosis on the left. There is an 8 x 4 mm calculus in the proximal left ureter with an apparent ureteral stricture on the left immediately distal to this calculus. There are areas of scarring in each kidney with multiple calculi, largely due to staghorn type calculi in multiple calices. No appreciable hydronephrosis on the right evident. 2. Left lower quadrant ostomy, patent. Apparent previous urinary diversion with ileal conduit in the right pelvis with right ostomy which is patent. 3. No bowel obstruction. No abscess in the abdomen or pelvis. No periappendiceal region inflammation. 4. Apparent hip joint effusions bilaterally, significantly larger on the left than on the right. Infected joint effusion on the left cannot be excluded. No air in this region, however. 5.  Chronic appearing hip dislocation  on the left with remodeling. 6. Spinal dysraphism with spina bifida occulta. Underlying osteoporosis. Advanced muscle atrophy throughout pelvis and thigh regions. Electronically Signed: By: Lowella Grip III M.D. On: 06/27/2020 14:45    Assessment: Challenging urinary track with a chronic left proximal ureteral stricture.  Bilateral kidney stones and a stone fragment that appears to be down in the left UPJ at the level of the stricture.  The patient clearly has left-sided chronic hydroureteronephrosis.  Improving with conservative therapy.  Plan: Continue current plan of care, no neph tube necessary  at this time. Encouraged patient to f/u with primary urologist at [facility] within the next few weeks to evaluate stone burden and discuss further management.  Will sign-off.  Louis Meckel, MD Urology 06/29/2020, 8:12 AM

## 2020-06-29 NOTE — Progress Notes (Signed)
PROGRESS NOTE    [name]  CHE:[id number] DOB: 12-09-1965 DOA: 06/27/2020 PCP: Idelle Crouch, MD   Brief Narrative: 54 year old male with complex medical history including spina bifida, neurogenic bladder requiring ileal conduit urinary diversion status post recent revision, chronic bilateral hydronephrosis, known bilateral nephrolithiasis, colostomy, hypertension, hyperlipidemia, GERD, CKD stage III, esophageal ulcer who presents with abdominal pain.  He was found to have a nearly obstructing stone in the left UPJ.  Patient was seen by urology who felt that his hydronephrosis.  Chronic.  Patient is not a significant amount of pain.  He is still complaining some abdominal tenderness and mild nausea.  Urology and interventional radiology discussed about need for placement of a percutaneous nephrostomy yesterday.  At this time we are proceeding with conservative management and antibiotics.  The patient appears to be responding clinically.  Urology follow-up appreciated.  Urology follow-up appreciated.  No nephrostomy tube recommended at this time.  Less abdominal pain.  Patient tolerating p.o.  No nausea or vomiting.  Urology signed off.   Assessment & Plan:   Principal Problem:   Complicated UTI (urinary tract infection) Active Problems:   Sepsis ([place])   Hypokalemia   GERD (gastroesophageal reflux disease)   Hyperlipidemia   Left ureteral stone   HTN (hypertension)   Acute renal failure superimposed on stage 3a chronic kidney disease ([place])   Spina bifida ([place])   Hydronephrosis   Kidney stone  Sepsis due to complicated UTI (urinary tract infection):  Patient meets criteria for sepsis with leukocytosis and tachycardia with heart rate of 102.  Lactic acid is normal.   Currently hemodynamically stable.  Patient has hydronephrosis and new left ureteral stone.   ED physician consulted IR Dr. Anselm Pancoast.  We also consulted Dr. Lovena Neighbours of urology.   Per urology no nephrostomy tube  indicated as patient is improving clinically with conservative management Plan: Continue conservative management with IV antibiotics and pain control Maintenance IV fluids Follow cultures to guide antibiotic choice Conservative management at this time Appreciate urology follow-up  Hypokalemia: Potassium 3.4 -Repleted potassium -Check magnesium level  GERD (gastroesophageal reflux disease) -Protonix  Hyperlipidemia -Crestor  Left ureteral stone, hydronephrosis and kidney stone -urology is consulted, see above recommendations  HTN (hypertension) -Hold HCTZ due to sepsis  Acute renal failure superimposed on stage 3a chronic kidney disease ([place]): Recent baseline creatinine 1.22 on 12/24/2019.   His creatinine is 1.8, BUN 39 on admission Likely due to UTI and stone Improving over interval Plan: -daily BMP -Hold HCTZ -IVF as above  Spina bifida ([place]) -s/p urostomy and colostomy.   DVT prophylaxis: SQ heparin Code Status: Full code Family Communication: None today Disposition Plan: Status is: Inpatient  Remains inpatient appropriate because:Inpatient level of care appropriate due to severity of illness   Dispo: The patient is from: Home              Anticipated d/c is to: Home              Anticipated d/c date is: 1 day              Patient currently is not medically stable to d/c.   Continue conservative management today with IV antibiotics and fluids.  Patient continues to clinically improve can transition to oral regimen and preparation for discharge tomorrow.  Therapy evaluations to be requested.  Consultants:   Urology, signed off  Procedures:   none  Antimicrobials:   Cefipime   Subjective: Seen and examined.  Abdominal pain improving  Objective: Vitals:   06/28/20 2024 06/28/20 2156 06/29/20 0036 06/29/20 0533  BP: (!) 143/89 126/85 137/75 121/73  Pulse: 89 90 87 95  Resp: 16 20 20 20   Temp: 98.5 F (36.9 C) 98.7 F (37.1 C) 99.3 F  (37.4 C) 98.5 F (36.9 C)  TempSrc: Oral  Oral Oral  SpO2: 100% 100% 96% 94%  Weight:      Height:        Intake/Output Summary (Last 24 hours) at 06/29/2020 1213 Last data filed at 06/29/2020 9811 Gross per 24 hour  Intake 0 ml  Output 1650 ml  Net -1650 ml   Filed Weights   06/26/20 2305 06/28/20 0823  Weight: 65.8 kg 65.8 kg    Examination:  General: No apparent distress, patient appears well HEENT: Normocephalic, atraumatic Neck, supple, trachea midline, no tenderness Heart: Regular rate and rhythm, S1/S2 normal, no murmurs Lungs: Clear to auscultation bilaterally, no adventitious sounds, normal work of breathing Abdomen: Soft, nontender, nondistended.  Positive bowel sounds.  Status post colostomy and urostomy Extremities: Normal, atraumatic, no clubbing or cyanosis, normal muscle tone Skin: No rashes or lesions, normal color Neurologic: Cranial nerves grossly intact, sensation intact, alert and oriented x3 Psychiatric: Normal affect      Data Reviewed: I have personally reviewed following labs and imaging studies  CBC: Recent Labs  Lab 06/26/20 2315 06/28/20 0830  WBC 18.9* 10.6*  HGB 14.4 12.5*  HCT 43.8 39.7  MCV 84.7 88.4  PLT 295 914   Basic Metabolic Panel: Recent Labs  Lab 06/26/20 2315 06/28/20 0830  NA 137 141  K 3.4* 3.2*  CL 98 103  CO2 24 24  GLUCOSE 120* 68*  BUN 39* 24*  CREATININE 1.80* 1.52*  CALCIUM 9.4 8.8*  MG  --  2.1   GFR: Estimated Creatinine Clearance: 38.6 mL/min (A) (by C-G formula based on SCr of 1.52 mg/dL (H)). Liver Function Tests: Recent Labs  Lab 06/26/20 2315  AST 26  ALT 20  ALKPHOS 75  BILITOT 0.7  PROT 7.9  ALBUMIN 4.0   Recent Labs  Lab 06/26/20 2315  LIPASE 44   No results for input(s): AMMONIA in the last 168 hours. Coagulation Profile: Recent Labs  Lab 06/27/20 1938  INR 1.0   Cardiac Enzymes: No results for input(s): CKTOTAL, CKMB, CKMBINDEX, TROPONINI in the last 168 hours. BNP  (last 3 results) No results for input(s): PROBNP in the last 8760 hours. HbA1C: No results for input(s): HGBA1C in the last 72 hours. CBG: No results for input(s): GLUCAP in the last 168 hours. Lipid Profile: No results for input(s): CHOL, HDL, LDLCALC, TRIG, CHOLHDL, LDLDIRECT in the last 72 hours. Thyroid Function Tests: No results for input(s): TSH, T4TOTAL, FREET4, T3FREE, THYROIDAB in the last 72 hours. Anemia Panel: No results for input(s): VITAMINB12, FOLATE, FERRITIN, TIBC, IRON, RETICCTPCT in the last 72 hours. Sepsis Labs: Recent Labs  Lab 06/26/20 2315 06/27/20 1938  PROCALCITON  --  1.64  LATICACIDVEN 1.5  --     Recent Results (from the past 240 hour(s))  Respiratory Panel by RT PCR (Flu A&B, Covid) - Nasopharyngeal Swab     Status: None   Collection Time: 06/26/20 11:16 PM   Specimen: Nasopharyngeal Swab  Result Value Ref Range Status   SARS Coronavirus 2 by RT PCR NEGATIVE NEGATIVE Final    Comment: (NOTE) SARS-CoV-2 target nucleic acids are NOT DETECTED.  The SARS-CoV-2 RNA is generally detectable in upper respiratoy specimens during the acute phase of infection.  The lowest concentration of SARS-CoV-2 viral copies this assay can detect is 131 copies/mL. A negative result does not preclude SARS-Cov-2 infection and should not be used as the sole basis for treatment or other patient management decisions. A negative result may occur with  improper specimen collection/handling, submission of specimen other than nasopharyngeal swab, presence of viral mutation(s) within the areas targeted by this assay, and inadequate number of viral copies (<131 copies/mL). A negative result must be combined with clinical observations, patient history, and epidemiological information. The expected result is Negative.  Fact Sheet for Patients:  PinkCheek.be  Fact Sheet for Healthcare Providers:  GravelBags.it  This test  is no t yet approved or cleared by the [place] FDA and  has been authorized for detection and/or diagnosis of SARS-CoV-2 by FDA under an Emergency Use Authorization (EUA). This EUA will remain  in effect (meaning this test can be used) for the duration of the COVID-19 declaration under Section 564(b)(1) of the Act, 21 U.S.C. section 360bbb-3(b)(1), unless the authorization is terminated or revoked sooner.     Influenza A by PCR NEGATIVE NEGATIVE Final   Influenza B by PCR NEGATIVE NEGATIVE Final    Comment: (NOTE) The Xpert Xpress SARS-CoV-2/FLU/RSV assay is intended as an aid in  the diagnosis of influenza from Nasopharyngeal swab specimens and  should not be used as a sole basis for treatment. Nasal washings and  aspirates are unacceptable for Xpert Xpress SARS-CoV-2/FLU/RSV  testing.  Fact Sheet for Patients: PinkCheek.be  Fact Sheet for Healthcare Providers: GravelBags.it  This test is not yet approved or cleared by the [place] FDA and  has been authorized for detection and/or diagnosis of SARS-CoV-2 by  FDA under an Emergency Use Authorization (EUA). This EUA will remain  in effect (meaning this test can be used) for the duration of the  Covid-19 declaration under Section 564(b)(1) of the Act, 21  U.S.C. section 360bbb-3(b)(1), unless the authorization is  terminated or revoked. Performed at [facility], [address], [place], [place] [address]   Urine Culture     Status: Abnormal   Collection Time: 06/27/20 12:47 AM   Specimen: Urine, Random  Result Value Ref Range Status   Specimen Description   Final    URINE, RANDOM Performed at [facility], [address] [place], [place] [address]    Special Requests   Final    NONE Performed at [facility], [address] [place], [place] [address]    Culture MULTIPLE SPECIES PRESENT, SUGGEST RECOLLECTION (A)  Final    Report Status 06/29/2020 FINAL  Final  Culture, blood (single)     Status: None (Preliminary result)   Collection Time: 06/27/20  3:11 PM   Specimen: BLOOD  Result Value Ref Range Status   Specimen Description   Final    BLOOD LEFT AC Performed at [facility], [address] [place], [place] [address]    Special Requests   Final    BOTTLES DRAWN AEROBIC AND ANAEROBIC Blood Culture results may not be optimal due to an excessive volume of blood received in culture bottles Performed at [facility], [address] [place], [place] [address]    Culture  Setup Time   Final    Organism ID to follow [place] TO, READ BACK BY AND VERIFIED WITH: ALEX CHAPPELL @2239  06/28/2020 TTG    Culture GRAM POSITIVE COCCI  Final   Report Status PENDING  Incomplete  Blood Culture ID Panel (Reflexed)     Status: Abnormal   Collection Time: 06/27/20  3:11 PM  Result Value Ref Range Status   Enterococcus faecalis NOT DETECTED NOT DETECTED Final   Enterococcus Faecium NOT DETECTED NOT DETECTED Final   Listeria monocytogenes NOT DETECTED NOT DETECTED Final   Staphylococcus species DETECTED (A) NOT DETECTED Final    Comment: CRITICAL RESULT CALLED TO, READ BACK BY AND VERIFIED WITH: ALEX CHAPPELL @2239  06/28/2020 TTG    Staphylococcus aureus (BCID) NOT DETECTED NOT DETECTED Final   Staphylococcus epidermidis DETECTED (A) NOT DETECTED Final    Comment: CRITICAL RESULT CALLED TO, READ BACK BY AND VERIFIED WITH: ALEX CHAPPELL @2239  06/28/2020 TTG    Staphylococcus lugdunensis NOT DETECTED NOT DETECTED Final   Streptococcus species NOT DETECTED NOT DETECTED Final   Streptococcus agalactiae NOT DETECTED NOT DETECTED Final   Streptococcus pneumoniae NOT DETECTED NOT DETECTED Final   Streptococcus pyogenes NOT DETECTED NOT DETECTED Final   A.calcoaceticus-baumannii NOT DETECTED NOT DETECTED Final   Bacteroides fragilis NOT DETECTED  NOT DETECTED Final   Enterobacterales NOT DETECTED NOT DETECTED Final   Enterobacter cloacae complex NOT DETECTED NOT DETECTED Final   Escherichia coli NOT DETECTED NOT DETECTED Final   Klebsiella aerogenes NOT DETECTED NOT DETECTED Final   Klebsiella oxytoca NOT DETECTED NOT DETECTED Final   Klebsiella pneumoniae NOT DETECTED NOT DETECTED Final   Proteus species NOT DETECTED NOT DETECTED Final   Salmonella species NOT DETECTED NOT DETECTED Final   Serratia marcescens NOT DETECTED NOT DETECTED Final   Haemophilus influenzae NOT DETECTED NOT DETECTED Final   Neisseria meningitidis NOT DETECTED NOT DETECTED Final   Pseudomonas aeruginosa NOT DETECTED NOT DETECTED Final   Stenotrophomonas maltophilia NOT DETECTED NOT DETECTED Final   Candida albicans NOT DETECTED NOT DETECTED Final   Candida auris NOT DETECTED NOT DETECTED Final   Candida glabrata NOT DETECTED NOT DETECTED Final   Candida krusei NOT DETECTED NOT DETECTED Final   Candida parapsilosis NOT DETECTED NOT DETECTED Final   Candida tropicalis NOT DETECTED NOT DETECTED Final   Cryptococcus neoformans/gattii NOT DETECTED NOT DETECTED Final   Methicillin resistance mecA/C NOT DETECTED NOT DETECTED Final    Comment: Performed at [facility], [address], [place], [place] [address]         Radiology Studies: CT ABDOMEN PELVIS WO CONTRAST  Addendum Date: 06/27/2020   ADDENDUM REPORT: 06/27/2020 15:31 ADDENDUM: Note that while the calculus in the proximal left ureter has not been seen previously, the degree of hydronephrosis on the left remains chronic. The apparent effusion in the left and to a lesser extent right hip joints also appear chronic and grossly stable with respect to thickening in these areas. Multiple calculi in the kidneys likewise are chronic. Electronically Signed   By: Lowella Grip III M.D.   On: 06/27/2020 15:31   Result Date: 06/27/2020 CLINICAL DATA:  Abdominal pain EXAM: CT ABDOMEN AND PELVIS  WITHOUT CONTRAST TECHNIQUE: Multidetector CT imaging of the abdomen and pelvis was performed following the standard protocol without oral or IV contrast. COMPARISON:  December 22, 2019 FINDINGS: Lower chest: There is mild bibasilar atelectasis. No edema or airspace opacity in the lung bases. Hepatobiliary: No focal liver lesions are appreciable on this noncontrast enhanced study. Gallbladder wall is not appreciably thickened. There is no biliary duct dilatation. Pancreas: There is no pancreatic mass or inflammatory focus. Spleen: No splenic lesions evident. A small accessory spleen is noted anteriorly. Adrenals/Urinary Tract: Adrenals bilaterally  appear normal. There is severe hydronephrosis on the left. There is no appreciable hydronephrosis on the right. There is an apparent cyst in the upper pole the right kidney measuring 2.5 x 1.4 cm. There are calcifications throughout the right kidney both more centrally and peripherally. There is a staghorn type calculus in upper pole calices on the right measuring 1.9 x 1.4 cm. There is a calculus in the mid right kidney peripherally measuring 1.1 x 1.1 cm. Several smaller calcification are noted in the right kidney as well, stable. There is calcification in the lower pole of the left kidney measuring 1.9 x 1.5 cm. This calculus is a staghorn type calculus in a lower pole calyx with nearby scarring. Scattered subcentimeter calculi noted. There is apparent stenosis in the proximal left ureter slightly distal to the left ureteropelvic junction. A focal calculus in this area measures 0.8 x 0.4 cm. No other ureteral calculi are evident on either side. Urinary bladder is midline with wall thickness within normal limits. There has been apparent urinary diversion to an ileal conduit located in the right pelvis. Right ileostomy is patent. Stomach/Bowel: Left lower quadrant colostomy noted with patency in this area. There is moderate stool in colon. There is no appreciable bowel wall  or mesenteric thickening. There is no evident bowel obstruction. There is no free air or portal venous air. There is mild fatty infiltration in the ileocecal valve. Vascular/Lymphatic: No abdominal aortic aneurysm. No appreciable vascular lesions are evident on this noncontrast enhanced study. No adenopathy is appreciable in the abdomen or pelvis. Reproductive: Prostate and seminal vesicles appear unremarkable. Other: No periappendiceal region inflammation. No abscess or ascites evident in the abdomen or pelvis. Musculoskeletal: Chronic hip dislocation on the left with dysplasia in the left hip joint region. Remodeling of the left femoral head noted with shallow acetabulum. Changes of spina bifida present and stable. Extensive spinal dysraphism is noted throughout the lumbar region with ankylosis at L3-4, stable. No blastic or lytic bone lesions are evident. There is a advanced muscle atrophy throughout the pelvis and thigh regions. There is soft tissue thickening in each hip joint, more severe on the left than on the right, likely due to hip joint effusions. This appearance is stable compared to most recent study. IMPRESSION: 1. Severe hydronephrosis on the left. There is an 8 x 4 mm calculus in the proximal left ureter with an apparent ureteral stricture on the left immediately distal to this calculus. There are areas of scarring in each kidney with multiple calculi, largely due to staghorn type calculi in multiple calices. No appreciable hydronephrosis on the right evident. 2. Left lower quadrant ostomy, patent. Apparent previous urinary diversion with ileal conduit in the right pelvis with right ostomy which is patent. 3. No bowel obstruction. No abscess in the abdomen or pelvis. No periappendiceal region inflammation. 4. Apparent hip joint effusions bilaterally, significantly larger on the left than on the right. Infected joint effusion on the left cannot be excluded. No air in this region, however. 5.  Chronic  appearing hip dislocation on the left with remodeling. 6. Spinal dysraphism with spina bifida occulta. Underlying osteoporosis. Advanced muscle atrophy throughout pelvis and thigh regions. Electronically Signed: By: Lowella Grip III M.D. On: 06/27/2020 14:45        Scheduled Meds: . heparin  5,000 Units Subcutaneous Q8H  . multivitamin with minerals  1 tablet Oral Daily  . pantoprazole  40 mg Oral Daily  . potassium chloride  30 mEq Oral Once  .  rosuvastatin  10 mg Oral QHS  . sucralfate  1 g Oral TID   Continuous Infusions: . sodium chloride 75 mL/hr at 06/29/20 0938  . ceFEPime (MAXIPIME) IV 2 g (06/29/20 1132)     LOS: 2 days    Time spent: 25 minutes    Sidney Ace, MD [facility] Pager [phone]  If 7PM-7AM, please contact night-coverage 06/29/2020, 12:13 PM

## 2020-06-30 DIAGNOSIS — N390 Urinary tract infection, site not specified: Secondary | ICD-10-CM | POA: Diagnosis not present

## 2020-06-30 LAB — CBC WITH DIFFERENTIAL/PLATELET
Abs Immature Granulocytes: 0.02 10*3/uL (ref 0.00–0.07)
Basophils Absolute: 0 10*3/uL (ref 0.0–0.1)
Basophils Relative: 1 %
Eosinophils Absolute: 0.3 10*3/uL (ref 0.0–0.5)
Eosinophils Relative: 5 %
HCT: 36.9 % — ABNORMAL LOW (ref 39.0–52.0)
Hemoglobin: 12 g/dL — ABNORMAL LOW (ref 13.0–17.0)
Immature Granulocytes: 0 %
Lymphocytes Relative: 17 %
Lymphs Abs: 1 10*3/uL (ref 0.7–4.0)
MCH: 28 pg (ref 26.0–34.0)
MCHC: 32.5 g/dL (ref 30.0–36.0)
MCV: 86.2 fL (ref 80.0–100.0)
Monocytes Absolute: 0.7 10*3/uL (ref 0.1–1.0)
Monocytes Relative: 12 %
Neutro Abs: 3.7 10*3/uL (ref 1.7–7.7)
Neutrophils Relative %: 65 %
Platelets: 255 10*3/uL (ref 150–400)
RBC: 4.28 MIL/uL (ref 4.22–5.81)
RDW: 15.3 % (ref 11.5–15.5)
WBC: 5.7 10*3/uL (ref 4.0–10.5)
nRBC: 0 % (ref 0.0–0.2)

## 2020-06-30 LAB — BASIC METABOLIC PANEL
Anion gap: 12 (ref 5–15)
BUN: 17 mg/dL (ref 6–20)
CO2: 24 mmol/L (ref 22–32)
Calcium: 8.2 mg/dL — ABNORMAL LOW (ref 8.9–10.3)
Chloride: 108 mmol/L (ref 98–111)
Creatinine, Ser: 1.07 mg/dL (ref 0.61–1.24)
GFR calc Af Amer: >60 mL/min (ref >60)
GFR calc non Af Amer: >60 mL/min (ref >60)
Glucose, Bld: 102 mg/dL — ABNORMAL HIGH (ref 70–99)
Potassium: 3.3 mmol/L — ABNORMAL LOW (ref 3.5–5.1)
Sodium: 144 mmol/L (ref 135–145)

## 2020-06-30 MED ORDER — CEFDINIR 300 MG PO CAPS
300.0000 mg | ORAL_CAPSULE | Freq: Two times a day (BID) | ORAL | 0 refills | Status: AC
Start: 2020-06-30 — End: 2020-07-08

## 2020-06-30 MED ORDER — CEFDINIR 300 MG PO CAPS
300.0000 mg | ORAL_CAPSULE | Freq: Two times a day (BID) | ORAL | Status: DC
  Administered 2020-06-30: 300 mg via ORAL
  Filled 2020-06-30 (×2): qty 1

## 2020-06-30 NOTE — Progress Notes (Signed)
OT Cancellation Note  Patient Details Name: [name] MRN: [id number] DOB: 08-13-1966   Cancelled Treatment:    Reason Eval/Treat Not Completed: OT screened, no needs identified, will sign off  Upon chart review and in speaking with/assessing pt, he reports being at his baseline functioning with ADLs at this time. Politely declines therapy participation and reports his sister will be picking him up at 4. Will complete order at this time and sign off. Thank you.  Gerrianne Scale, [place], OTR/L ascom [phone] 06/30/20, 10:15 AM

## 2020-06-30 NOTE — Discharge Summary (Signed)
Physician Discharge Summary  [name] HYW:[id number] DOB: 03-07-1966 DOA: 06/27/2020  PCP: Idelle Crouch, MD  Admit date: 06/27/2020 Discharge date: 06/30/2020  Admitted From: Home Disposition: Home  Recommendations for Outpatient Follow-up:  1. Follow up with PCP in 1-2 weeks 2. Follow-up with [facility] urology in 2 weeks  Home Health: No Equipment/Devices: None Discharge Condition: Stable CODE STATUS: Full Diet recommendation: Heart Healthy Brief/Interim Summary: 54 year old male with complex medical history including spina bifida, neurogenic bladder requiring ileal conduit urinary diversion status post recent revision, chronic bilateral hydronephrosis, known bilateral nephrolithiasis, colostomy, hypertension, hyperlipidemia, GERD, CKD stage III, esophageal ulcer who presents with abdominal pain.  He was found to have a nearly obstructing stone in the left UPJ.  Patient was seen by urology who felt that his hydronephrosis.  Chronic.  Patient is not a significant amount of pain.  He is still complaining some abdominal tenderness and mild nausea.  Urology and interventional radiology discussed about need for placement of a percutaneous nephrostomy yesterday.  At this time we are proceeding with conservative management and antibiotics.  The patient appears to be responding clinically.  Urology follow-up appreciated.  Urology follow-up appreciated.  No nephrostomy tube recommended at this time.  Less abdominal pain.  Patient tolerating p.o.  No nausea or vomiting.  Urology signed off.  Patient remained stable with conservative management.  Tolerating p.o. at time of discharge.  No fevers noted.  Transition to oral antibiotics in preparation for discharge.  Follow-up with [facility] urology within 2 weeks for further evaluation of nephrolithiasis and consideration for stone removal.   Discharge Diagnoses:  Principal Problem:   Complicated UTI (urinary tract infection) Active Problems:    Sepsis ([place])   Hypokalemia   GERD (gastroesophageal reflux disease)   Hyperlipidemia   Left ureteral stone   HTN (hypertension)   Acute renal failure superimposed on stage 3a chronic kidney disease ([place])   Spina bifida ([place])   Hydronephrosis   Kidney stone  Sepsis due to complicated UTI (urinary tract infection): Patient meets criteria for sepsis with leukocytosis and tachycardia with heart rate of 102. Lactic acid is normal.  Currently hemodynamically stable.  Patient has hydronephrosis and new left ureteral stone. ED physicianconsulted IR Dr. Anselm Pancoast.  We also consulted Dr. Lovena Neighbours ofurology.  Per urology no nephrostomy tube indicated as patient is improving clinically with conservative management IV antibiotics discontinued on discharge Transition to p.o. cefdinir to complete empiric antibiotic course Patient instructed to follow-up with [facility] urology in 2 weeks  Hypokalemia: Potassium 3.4 -Repleted potassium -Check magnesium level  GERD (gastroesophageal reflux disease) -Protonix  Hyperlipidemia -Crestor  Left ureteral stone, hydronephrosisand kidney stone -urology is consulted, see above recommendations  HTN (hypertension) -Hold HCTZ due to sepsis  Acute renal failure superimposed on stage 3a chronic kidney disease (HCC):Recent baseline creatinine 1.22 on 12/24/2019.  His creatinine is 1.8, BUN 39 on admission Likely due to UTI and stone Improving over interval Can resume home regimen on discharge  Spina bifida [facility]) -s/purostomy and colostomy.  Discharge Instructions  Discharge Instructions    Diet - low sodium heart healthy   Complete by: As directed    Increase activity slowly   Complete by: As directed      Allergies as of 06/30/2020      Reactions   Morphine And Related Nausea And Vomiting   Sulfa Antibiotics Other (See Comments)   Reaction: unknown   Ivp Dye [iodinated Diagnostic Agents] Hives   Latex Rash      Medication  List     TAKE these medications   acetaminophen 325 MG tablet Commonly known as: TYLENOL Take 2 tablets (650 mg total) by mouth every 6 (six) hours as needed for mild pain (or Fever >/= 101).   cefdinir 300 MG capsule Commonly known as: OMNICEF Take 1 capsule (300 mg total) by mouth every 12 (twelve) hours for 8 days.   CENTRUM MEN PO Take 1 tablet by mouth daily.   dicyclomine 10 MG capsule Commonly known as: BENTYL Take 10 mg by mouth 3 (three) times daily as needed for spasms.   hydrochlorothiazide 25 MG tablet Commonly known as: HYDRODIURIL Take 25 mg by mouth daily as needed.   metoCLOPramide 5 MG tablet Commonly known as: REGLAN Take 5 mg by mouth every 8 (eight) hours as needed for nausea or vomiting.   pantoprazole 40 MG tablet Commonly known as: PROTONIX Take 1 tablet (40 mg total) by mouth daily.   potassium chloride SA 20 MEQ tablet Commonly known as: KLOR-CON Take 20 mEq by mouth daily.   rosuvastatin 10 MG tablet Commonly known as: CRESTOR Take 10 mg by mouth at bedtime.   sucralfate 1 g tablet Commonly known as: CARAFATE Take 1 g by mouth 3 (three) times daily.       Follow-up [facility]. Schedule an appointment as soon as possible for a visit in 1 week.   Specialty: Urology Why: he needs to follow up with [facility]  Contact information: [address] [place] [phone]       Idelle Crouch, MD. Schedule an appointment as soon as possible for a visit in 1 week.   Specialty: Internal Medicine Contact information: [place] [address] [phone]        Viprakasit, Marlowe [place], MD. Schedule an appointment as soon as possible for a visit in 2 weeks.   Specialty: Urology Why: Schedule an appointment within 2 weeks to discuss your kidney stone management(left message for them to call pt) Contact information: [address] [address] [phone]              Allergies  Allergen Reactions  . Morphine And Related Nausea And Vomiting  . Sulfa Antibiotics Other (See Comments)    Reaction: unknown  . Ivp Dye [Iodinated Diagnostic Agents] Hives  . Latex Rash    Consultations:  Urology   Procedures/Studies: CT ABDOMEN PELVIS WO CONTRAST  Addendum Date: 06/27/2020   ADDENDUM REPORT: 06/27/2020 15:31 ADDENDUM: Note that while the calculus in the proximal left ureter has not been seen previously, the degree of hydronephrosis on the left remains chronic. The apparent effusion in the left and to a lesser extent right hip joints also appear chronic and grossly stable with respect to thickening in these areas. Multiple calculi in the kidneys likewise are chronic. Electronically Signed   By: Lowella Grip III M.D.   On: 06/27/2020 15:31   Result Date: 06/27/2020 CLINICAL DATA:  Abdominal pain EXAM: CT ABDOMEN AND PELVIS WITHOUT CONTRAST TECHNIQUE: Multidetector CT imaging of the abdomen and pelvis was performed following the standard protocol without oral or IV contrast. COMPARISON:  December 22, 2019 FINDINGS: Lower chest: There is mild bibasilar atelectasis. No edema or airspace opacity in the lung bases. Hepatobiliary: No focal liver lesions are appreciable on this noncontrast enhanced study. Gallbladder wall is not appreciably thickened. There is no biliary  duct dilatation. Pancreas: There is no pancreatic mass or inflammatory focus. Spleen: No splenic lesions evident. A small accessory spleen is noted anteriorly. Adrenals/Urinary Tract: Adrenals bilaterally appear normal. There is severe hydronephrosis on the left. There is no appreciable hydronephrosis on the right. There is an apparent cyst in the upper pole the right kidney measuring 2.5 x 1.4 cm. There are calcifications throughout the right kidney both more centrally and peripherally. There is a staghorn type calculus  in upper pole calices on the right measuring 1.9 x 1.4 cm. There is a calculus in the mid right kidney peripherally measuring 1.1 x 1.1 cm. Several smaller calcification are noted in the right kidney as well, stable. There is calcification in the lower pole of the left kidney measuring 1.9 x 1.5 cm. This calculus is a staghorn type calculus in a lower pole calyx with nearby scarring. Scattered subcentimeter calculi noted. There is apparent stenosis in the proximal left ureter slightly distal to the left ureteropelvic junction. A focal calculus in this area measures 0.8 x 0.4 cm. No other ureteral calculi are evident on either side. Urinary bladder is midline with wall thickness within normal limits. There has been apparent urinary diversion to an ileal conduit located in the right pelvis. Right ileostomy is patent. Stomach/Bowel: Left lower quadrant colostomy noted with patency in this area. There is moderate stool in colon. There is no appreciable bowel wall or mesenteric thickening. There is no evident bowel obstruction. There is no free air or portal venous air. There is mild fatty infiltration in the ileocecal valve. Vascular/Lymphatic: No abdominal aortic aneurysm. No appreciable vascular lesions are evident on this noncontrast enhanced study. No adenopathy is appreciable in the abdomen or pelvis. Reproductive: Prostate and seminal vesicles appear unremarkable. Other: No periappendiceal region inflammation. No abscess or ascites evident in the abdomen or pelvis. Musculoskeletal: Chronic hip dislocation on the left with dysplasia in the left hip joint region. Remodeling of the left femoral head noted with shallow acetabulum. Changes of spina bifida present and stable. Extensive spinal dysraphism is noted throughout the lumbar region with ankylosis at L3-4, stable. No blastic or lytic bone lesions are evident. There is a advanced muscle atrophy throughout the pelvis and thigh regions. There is soft tissue  thickening in each hip joint, more severe on the left than on the right, likely due to hip joint effusions. This appearance is stable compared to most recent study. IMPRESSION: 1. Severe hydronephrosis on the left. There is an 8 x 4 mm calculus in the proximal left ureter with an apparent ureteral stricture on the left immediately distal to this calculus. There are areas of scarring in each kidney with multiple calculi, largely due to staghorn type calculi in multiple calices. No appreciable hydronephrosis on the right evident. 2. Left lower quadrant ostomy, patent. Apparent previous urinary diversion with ileal conduit in the right pelvis with right ostomy which is patent. 3. No bowel obstruction. No abscess in the abdomen or pelvis. No periappendiceal region inflammation. 4. Apparent hip joint effusions bilaterally, significantly larger on the left than on the right. Infected joint effusion on the left cannot be excluded. No air in this region, however. 5.  Chronic appearing hip dislocation on the left with remodeling. 6. Spinal dysraphism with spina bifida occulta. Underlying osteoporosis. Advanced muscle atrophy throughout pelvis and thigh regions. Electronically Signed: By: Lowella Grip III M.D. On: 06/27/2020 14:45    (Echo, Carotid, EGD, Colonoscopy, ERCP)    Subjective: Seen and examined at time  of discharge.  No distress.  Stable for discharge home  Discharge Exam: Vitals:   06/29/20 2036 06/30/20 0424  BP: 114/69 123/82  Pulse: 88 80  Resp: 20 20  Temp: 98 F (36.7 C) 98.3 F (36.8 C)  SpO2: 98% 96%   Vitals:   06/29/20 1228 06/29/20 2036 06/29/20 2300 06/30/20 0424  BP: 126/67 114/69  123/82  Pulse: 91 88  80  Resp: 18 20  20   Temp: 98.7 F (37.1 C) 98 F (36.7 C)  98.3 F (36.8 C)  TempSrc: Oral Oral  Oral  SpO2: 95% 98%  96%  Weight:   55 kg   Height:        General: Pt is alert, awake, not in acute distress Cardiovascular: RRR, S1/S2 +, no rubs, no  gallops Respiratory: CTA bilaterally, no wheezing, no rhonchi Abdominal: Soft, NT, ND, bowel sounds + Extremities: no edema, no cyanosis    The results of significant diagnostics from this hospitalization (including imaging, microbiology, ancillary and laboratory) are listed below for reference.     Microbiology: Recent Results (from the past 240 hour(s))  Respiratory Panel by RT PCR (Flu A&B, Covid) - Nasopharyngeal Swab     Status: None   Collection Time: 06/26/20 11:16 PM   Specimen: Nasopharyngeal Swab  Result Value Ref Range Status   SARS Coronavirus 2 by RT PCR NEGATIVE NEGATIVE Final    Comment: (NOTE) SARS-CoV-2 target nucleic acids are NOT DETECTED.  The SARS-CoV-2 RNA is generally detectable in upper respiratoy specimens during the acute phase of infection. The lowest concentration of SARS-CoV-2 viral copies this assay can detect is 131 copies/mL. A negative result does not preclude SARS-Cov-2 infection and should not be used as the sole basis for treatment or other patient management decisions. A negative result may occur with  improper specimen collection/handling, submission of specimen other than nasopharyngeal swab, presence of viral mutation(s) within the areas targeted by this assay, and inadequate number of viral copies (<131 copies/mL). A negative result must be combined with clinical observations, patient history, and epidemiological information. The expected result is Negative.  Fact Sheet for Patients:  PinkCheek.be  Fact Sheet for Healthcare Providers:  GravelBags.it  This test is no t yet approved or cleared by the [place] FDA and  has been authorized for detection and/or diagnosis of SARS-CoV-2 by FDA under an Emergency Use Authorization (EUA). This EUA will remain  in effect (meaning this test can be used) for the duration of the COVID-19 declaration under Section 564(b)(1) of the Act,  21 U.S.C. section 360bbb-3(b)(1), unless the authorization is terminated or revoked sooner.     Influenza A by PCR NEGATIVE NEGATIVE Final   Influenza B by PCR NEGATIVE NEGATIVE Final    Comment: (NOTE) The Xpert Xpress SARS-CoV-2/FLU/RSV assay is intended as an aid in  the diagnosis of influenza from Nasopharyngeal swab specimens and  should not be used as a sole basis for treatment. Nasal washings and  aspirates are unacceptable for Xpert Xpress SARS-CoV-2/FLU/RSV  testing.  Fact Sheet for Patients: PinkCheek.be  Fact Sheet for Healthcare Providers: GravelBags.it  This test is not yet approved or cleared by the [place] FDA and  has been authorized for detection and/or diagnosis of SARS-CoV-2 by  FDA under an Emergency Use Authorization (EUA). This EUA will remain  in effect (meaning this test can be used) for the duration of the  Covid-19 declaration under Section 564(b)(1) of the Act, 21  U.S.C. section 360bbb-3(b)(1), unless the authorization  is  terminated or revoked. Performed at [facility], [address], [place], [place] [address]   Urine Culture     Status: Abnormal   Collection Time: 06/27/20 12:47 AM   Specimen: Urine, Random  Result Value Ref Range Status   Specimen Description   Final    URINE, RANDOM Performed at [facility], [address], [place], [place] [address]    Special Requests   Final    NONE Performed at [facility], [address] [place], [place] [address]    Culture MULTIPLE SPECIES PRESENT, SUGGEST RECOLLECTION (A)  Final   Report Status 06/29/2020 FINAL  Final  Culture, blood (single)     Status: Abnormal (Preliminary result)   Collection Time: 06/27/20  3:11 PM   Specimen: BLOOD  Result Value Ref Range Status   Specimen Description   Final    BLOOD LEFT AC Performed at [facility], [address] [place], [place] [address]     Special Requests   Final    BOTTLES DRAWN AEROBIC AND ANAEROBIC Blood Culture results may not be optimal due to an excessive volume of blood received in culture bottles Performed at [facility], [address], [place], [place] [address]    Culture  Setup Time   Final    GRAM POSITIVE COCCI ANAEROBIC BOTTLE ONLY CRITICAL RESULT CALLED TO, READ BACK BY AND VERIFIED WITH: ALEX CHAPPELL @2239  06/28/2020 TTG    Culture (A)  Final    STAPHYLOCOCCUS EPIDERMIDIS THE SIGNIFICANCE OF ISOLATING THIS ORGANISM FROM A SINGLE VENIPUNCTURE CANNOT BE PREDICTED WITHOUT FURTHER CLINICAL AND CULTURE CORRELATION. SUSCEPTIBILITIES AVAILABLE ONLY ON REQUEST. Performed at [facility], [place] [address], [place], [place] [address]    Report Status PENDING  Incomplete  Blood Culture ID Panel (Reflexed)     Status: Abnormal   Collection Time: 06/27/20  3:11 PM  Result Value Ref Range Status   Enterococcus faecalis NOT DETECTED NOT DETECTED Final   Enterococcus Faecium NOT DETECTED NOT DETECTED Final   Listeria monocytogenes NOT DETECTED NOT DETECTED Final   Staphylococcus species DETECTED (A) NOT DETECTED Final    Comment: CRITICAL RESULT CALLED TO, READ BACK BY AND VERIFIED WITH: ALEX CHAPPELL @2239  06/28/2020 TTG    Staphylococcus aureus (BCID) NOT DETECTED NOT DETECTED Final   Staphylococcus epidermidis DETECTED (A) NOT DETECTED Final    Comment: CRITICAL RESULT CALLED TO, READ BACK BY AND VERIFIED WITH: ALEX CHAPPELL @2239  06/28/2020 TTG    Staphylococcus lugdunensis NOT DETECTED NOT DETECTED Final   Streptococcus species NOT DETECTED NOT DETECTED Final   Streptococcus agalactiae NOT DETECTED NOT DETECTED Final   Streptococcus pneumoniae NOT DETECTED NOT DETECTED Final   Streptococcus pyogenes NOT DETECTED NOT DETECTED Final   A.calcoaceticus-baumannii NOT DETECTED NOT DETECTED Final   Bacteroides fragilis NOT DETECTED NOT DETECTED Final   Enterobacterales NOT DETECTED NOT DETECTED  Final   Enterobacter cloacae complex NOT DETECTED NOT DETECTED Final   Escherichia coli NOT DETECTED NOT DETECTED Final   Klebsiella aerogenes NOT DETECTED NOT DETECTED Final   Klebsiella oxytoca NOT DETECTED NOT DETECTED Final   Klebsiella pneumoniae NOT DETECTED NOT DETECTED Final   Proteus species NOT DETECTED NOT DETECTED Final   Salmonella species NOT DETECTED NOT DETECTED Final   Serratia marcescens NOT DETECTED NOT DETECTED Final   Haemophilus influenzae NOT DETECTED NOT DETECTED Final   Neisseria meningitidis NOT DETECTED NOT DETECTED Final   Pseudomonas aeruginosa NOT DETECTED NOT DETECTED Final  Stenotrophomonas maltophilia NOT DETECTED NOT DETECTED Final   Candida albicans NOT DETECTED NOT DETECTED Final   Candida auris NOT DETECTED NOT DETECTED Final   Candida glabrata NOT DETECTED NOT DETECTED Final   Candida krusei NOT DETECTED NOT DETECTED Final   Candida parapsilosis NOT DETECTED NOT DETECTED Final   Candida tropicalis NOT DETECTED NOT DETECTED Final   Cryptococcus neoformans/gattii NOT DETECTED NOT DETECTED Final   Methicillin resistance mecA/C NOT DETECTED NOT DETECTED Final    Comment: Performed at [facility], [address] [place], [place] [address]     Labs: BNP (last 3 results) No results for input(s): BNP in the last 8760 hours. Basic Metabolic Panel: Recent Labs  Lab 06/26/20 2315 06/28/20 0830 06/30/20 0527  NA 137 141 144  K 3.4* 3.2* 3.3*  CL 98 103 108  CO2 24 24 24   GLUCOSE 120* 68* 102*  BUN 39* 24* 17  CREATININE 1.80* 1.52* 1.07  CALCIUM 9.4 8.8* 8.2*  MG  --  2.1  --    Liver Function Tests: Recent Labs  Lab 06/26/20 2315  AST 26  ALT 20  ALKPHOS 75  BILITOT 0.7  PROT 7.9  ALBUMIN 4.0   Recent Labs  Lab 06/26/20 2315  LIPASE 44   No results for input(s): AMMONIA in the last 168 hours. CBC: Recent Labs  Lab 06/26/20 2315 06/28/20 0830 06/30/20 0527  WBC 18.9* 10.6* 5.7  NEUTROABS  --   --  3.7  HGB 14.4  12.5* 12.0*  HCT 43.8 39.7 36.9*  MCV 84.7 88.4 86.2  PLT 295 251 255   Cardiac Enzymes: No results for input(s): CKTOTAL, CKMB, CKMBINDEX, TROPONINI in the last 168 hours. BNP: Invalid input(s): POCBNP CBG: No results for input(s): GLUCAP in the last 168 hours. D-Dimer No results for input(s): DDIMER in the last 72 hours. Hgb A1c No results for input(s): HGBA1C in the last 72 hours. Lipid Profile No results for input(s): CHOL, HDL, LDLCALC, TRIG, CHOLHDL, LDLDIRECT in the last 72 hours. Thyroid function studies No results for input(s): TSH, T4TOTAL, T3FREE, THYROIDAB in the last 72 hours.  Invalid input(s): FREET3 Anemia work up No results for input(s): VITAMINB12, FOLATE, FERRITIN, TIBC, IRON, RETICCTPCT in the last 72 hours. Urinalysis    Component Value Date/Time   COLORURINE YELLOW (A) 06/27/2020 0047   APPEARANCEUR CLOUDY (A) 06/27/2020 0047   APPEARANCEUR Cloudy (A) 03/21/2019 1130   LABSPEC 1.009 06/27/2020 0047   PHURINE 8.0 06/27/2020 0047   GLUCOSEU NEGATIVE 06/27/2020 0047   HGBUR MODERATE (A) 06/27/2020 0047   BILIRUBINUR NEGATIVE 06/27/2020 0047   BILIRUBINUR Negative 03/21/2019 1130   KETONESUR NEGATIVE 06/27/2020 0047   PROTEINUR 30 (A) 06/27/2020 0047   NITRITE POSITIVE (A) 06/27/2020 0047   LEUKOCYTESUR LARGE (A) 06/27/2020 0047   Sepsis Labs Invalid input(s): PROCALCITONIN,  WBC,  LACTICIDVEN Microbiology Recent Results (from the past 240 hour(s))  Respiratory Panel by RT PCR (Flu A&B, Covid) - Nasopharyngeal Swab     Status: None   Collection Time: 06/26/20 11:16 PM   Specimen: Nasopharyngeal Swab  Result Value Ref Range Status   SARS Coronavirus 2 by RT PCR NEGATIVE NEGATIVE Final    Comment: (NOTE) SARS-CoV-2 target nucleic acids are NOT DETECTED.  The SARS-CoV-2 RNA is generally detectable in upper respiratoy specimens during the acute phase of infection. The lowest concentration of SARS-CoV-2 viral copies this assay can detect is 131  copies/mL. A negative result does not preclude SARS-Cov-2 infection and should not be used  as the sole basis for treatment or other patient management decisions. A negative result may occur with  improper specimen collection/handling, submission of specimen other than nasopharyngeal swab, presence of viral mutation(s) within the areas targeted by this assay, and inadequate number of viral copies (<131 copies/mL). A negative result must be combined with clinical observations, patient history, and epidemiological information. The expected result is Negative.  Fact Sheet for Patients:  PinkCheek.be  Fact Sheet for Healthcare Providers:  GravelBags.it  This test is no t yet approved or cleared by the [place] FDA and  has been authorized for detection and/or diagnosis of SARS-CoV-2 by FDA under an Emergency Use Authorization (EUA). This EUA will remain  in effect (meaning this test can be used) for the duration of the COVID-19 declaration under Section 564(b)(1) of the Act, 21 U.S.C. section 360bbb-3(b)(1), unless the authorization is terminated or revoked sooner.     Influenza A by PCR NEGATIVE NEGATIVE Final   Influenza B by PCR NEGATIVE NEGATIVE Final    Comment: (NOTE) The Xpert Xpress SARS-CoV-2/FLU/RSV assay is intended as an aid in  the diagnosis of influenza from Nasopharyngeal swab specimens and  should not be used as a sole basis for treatment. Nasal washings and  aspirates are unacceptable for Xpert Xpress SARS-CoV-2/FLU/RSV  testing.  Fact Sheet for Patients: PinkCheek.be  Fact Sheet for Healthcare Providers: GravelBags.it  This test is not yet approved or cleared by the [place] FDA and  has been authorized for detection and/or diagnosis of SARS-CoV-2 by  FDA under an Emergency Use Authorization (EUA). This EUA will remain  in effect (meaning  this test can be used) for the duration of the  Covid-19 declaration under Section 564(b)(1) of the Act, 21  U.S.C. section 360bbb-3(b)(1), unless the authorization is  terminated or revoked. Performed at [facility], [address] [place], [place] [address]   Urine Culture     Status: Abnormal   Collection Time: 06/27/20 12:47 AM   Specimen: Urine, Random  Result Value Ref Range Status   Specimen Description   Final    URINE, RANDOM Performed at [facility], [address] [place], [place] [address]    Special Requests   Final    NONE Performed at [facility], [address] [place], [place] [address]    Culture MULTIPLE SPECIES PRESENT, SUGGEST RECOLLECTION (A)  Final   Report Status 06/29/2020 FINAL  Final  Culture, blood (single)     Status: Abnormal (Preliminary result)   Collection Time: 06/27/20  3:11 PM   Specimen: BLOOD  Result Value Ref Range Status   Specimen Description   Final    BLOOD LEFT AC Performed at [facility], [address], [place], [place] [address]    Special Requests   Final    BOTTLES DRAWN AEROBIC AND ANAEROBIC Blood Culture results may not be optimal due to an excessive volume of blood received in culture bottles Performed at [facility], [address], [place], [place] [address]    Culture  Setup Time   Final    GRAM POSITIVE COCCI ANAEROBIC BOTTLE ONLY CRITICAL RESULT CALLED TO, READ BACK BY AND VERIFIED WITH: ALEX CHAPPELL @2239  06/28/2020 TTG    Culture (A)  Final    STAPHYLOCOCCUS EPIDERMIDIS THE SIGNIFICANCE OF ISOLATING THIS ORGANISM FROM A SINGLE VENIPUNCTURE CANNOT BE PREDICTED WITHOUT FURTHER CLINICAL AND CULTURE CORRELATION. SUSCEPTIBILITIES AVAILABLE ONLY ON REQUEST. Performed at [facility], [place] [address],  [place], [place] [address]    Report Status PENDING  Incomplete  Blood Culture ID Panel (Reflexed)     Status: Abnormal   Collection Time: 06/27/20  3:11 PM   Result Value Ref Range Status   Enterococcus faecalis NOT DETECTED NOT DETECTED Final   Enterococcus Faecium NOT DETECTED NOT DETECTED Final   Listeria monocytogenes NOT DETECTED NOT DETECTED Final   Staphylococcus species DETECTED (A) NOT DETECTED Final    Comment: CRITICAL RESULT CALLED TO, READ BACK BY AND VERIFIED WITH: ALEX CHAPPELL @2239  06/28/2020 TTG    Staphylococcus aureus (BCID) NOT DETECTED NOT DETECTED Final   Staphylococcus epidermidis DETECTED (A) NOT DETECTED Final    Comment: CRITICAL RESULT CALLED TO, READ BACK BY AND VERIFIED WITH: ALEX CHAPPELL @2239  06/28/2020 TTG    Staphylococcus lugdunensis NOT DETECTED NOT DETECTED Final   Streptococcus species NOT DETECTED NOT DETECTED Final   Streptococcus agalactiae NOT DETECTED NOT DETECTED Final   Streptococcus pneumoniae NOT DETECTED NOT DETECTED Final   Streptococcus pyogenes NOT DETECTED NOT DETECTED Final   A.calcoaceticus-baumannii NOT DETECTED NOT DETECTED Final   Bacteroides fragilis NOT DETECTED NOT DETECTED Final   Enterobacterales NOT DETECTED NOT DETECTED Final   Enterobacter cloacae complex NOT DETECTED NOT DETECTED Final   Escherichia coli NOT DETECTED NOT DETECTED Final   Klebsiella aerogenes NOT DETECTED NOT DETECTED Final   Klebsiella oxytoca NOT DETECTED NOT DETECTED Final   Klebsiella pneumoniae NOT DETECTED NOT DETECTED Final   Proteus species NOT DETECTED NOT DETECTED Final   Salmonella species NOT DETECTED NOT DETECTED Final   Serratia marcescens NOT DETECTED NOT DETECTED Final   Haemophilus influenzae NOT DETECTED NOT DETECTED Final   Neisseria meningitidis NOT DETECTED NOT DETECTED Final   Pseudomonas aeruginosa NOT DETECTED NOT DETECTED Final   Stenotrophomonas maltophilia NOT DETECTED NOT DETECTED Final   Candida albicans NOT DETECTED NOT DETECTED Final   Candida auris NOT DETECTED NOT DETECTED Final   Candida glabrata NOT DETECTED NOT DETECTED Final   Candida krusei NOT DETECTED NOT DETECTED  Final   Candida parapsilosis NOT DETECTED NOT DETECTED Final   Candida tropicalis NOT DETECTED NOT DETECTED Final   Cryptococcus neoformans/gattii NOT DETECTED NOT DETECTED Final   Methicillin resistance mecA/C NOT DETECTED NOT DETECTED Final    Comment: Performed at [facility], [address] [place], [place] [address]     Time coordinating discharge: Over 30 minutes  SIGNED:   Sidney Ace, MD  [facility] 06/30/2020, 12:52 PM Pager   If 7PM-7AM, please contact night-coverage

## 2020-06-30 NOTE — Progress Notes (Signed)
PT Cancellation Note  Patient Details Name: Cohen Boettner MRN: [id number] DOB: [birth year]   Cancelled Treatment:    Reason Eval/Treat Not Completed: Other (comment) (PT spoke with patient, pt reported he is at his baseline level of functioning, and is not interested in physical therapy. Pt encouraged to let the MD know if he does feel like this changes/has difficulty with mobility during hospital stay.PT to sign off.)   Lieutenant Diego PT, DPT 9:18 AM,06/30/20

## 2020-06-30 NOTE — Care Management Important Message (Signed)
Important Message  Patient Details  Name: [name] MRN: [id number] Date of Birth: 11-01-1965   Medicare Important Message Given:  N/A - LOS <3 / Initial given by admissions  Initial Medicare IM given by Patient Access Associate on 06/29/2020 at 11:45am.   Dannette Barbara 06/30/2020, 9:17 AM

## 2020-06-30 NOTE — Progress Notes (Signed)
Patient's sister arrived to pick patient up. Discharge instructions reviewed with sister. Questions encouraged and answered to family satisfaction. All belongings sent with patient. Terrial Rhodes

## 2020-07-01 LAB — CULTURE, BLOOD (SINGLE)

## 2020-07-04 DIAGNOSIS — Q059 Spina bifida, unspecified: Secondary | ICD-10-CM | POA: Diagnosis not present

## 2020-07-07 DIAGNOSIS — C679 Malignant neoplasm of bladder, unspecified: Secondary | ICD-10-CM | POA: Diagnosis not present

## 2020-07-07 DIAGNOSIS — Z936 Other artificial openings of urinary tract status: Secondary | ICD-10-CM | POA: Diagnosis not present

## 2020-07-07 DIAGNOSIS — Q059 Spina bifida, unspecified: Secondary | ICD-10-CM | POA: Diagnosis not present

## 2020-07-12 DIAGNOSIS — Q059 Spina bifida, unspecified: Secondary | ICD-10-CM | POA: Diagnosis not present

## 2020-07-12 DIAGNOSIS — C679 Malignant neoplasm of bladder, unspecified: Secondary | ICD-10-CM | POA: Diagnosis not present

## 2020-07-12 DIAGNOSIS — Z936 Other artificial openings of urinary tract status: Secondary | ICD-10-CM | POA: Diagnosis not present

## 2020-09-03 DIAGNOSIS — Q059 Spina bifida, unspecified: Secondary | ICD-10-CM | POA: Diagnosis not present

## 2020-09-22 DIAGNOSIS — Z936 Other artificial openings of urinary tract status: Secondary | ICD-10-CM | POA: Diagnosis not present

## 2020-09-22 DIAGNOSIS — C679 Malignant neoplasm of bladder, unspecified: Secondary | ICD-10-CM | POA: Diagnosis not present

## 2020-09-22 DIAGNOSIS — Q059 Spina bifida, unspecified: Secondary | ICD-10-CM | POA: Diagnosis not present

## 2020-10-06 DIAGNOSIS — Q059 Spina bifida, unspecified: Secondary | ICD-10-CM | POA: Diagnosis not present

## 2020-10-28 DIAGNOSIS — Q059 Spina bifida, unspecified: Secondary | ICD-10-CM | POA: Diagnosis not present

## 2020-10-28 DIAGNOSIS — Z936 Other artificial openings of urinary tract status: Secondary | ICD-10-CM | POA: Diagnosis not present

## 2020-10-28 DIAGNOSIS — C679 Malignant neoplasm of bladder, unspecified: Secondary | ICD-10-CM | POA: Diagnosis not present

## 2020-10-29 DIAGNOSIS — Q059 Spina bifida, unspecified: Secondary | ICD-10-CM | POA: Diagnosis not present

## 2020-10-29 DIAGNOSIS — Z936 Other artificial openings of urinary tract status: Secondary | ICD-10-CM | POA: Diagnosis not present

## 2020-10-29 DIAGNOSIS — C679 Malignant neoplasm of bladder, unspecified: Secondary | ICD-10-CM | POA: Diagnosis not present

## 2020-10-31 DIAGNOSIS — Q059 Spina bifida, unspecified: Secondary | ICD-10-CM | POA: Diagnosis not present

## 2020-10-31 DIAGNOSIS — C679 Malignant neoplasm of bladder, unspecified: Secondary | ICD-10-CM | POA: Diagnosis not present

## 2020-10-31 DIAGNOSIS — Z936 Other artificial openings of urinary tract status: Secondary | ICD-10-CM | POA: Diagnosis not present

## 2020-11-04 DIAGNOSIS — Q059 Spina bifida, unspecified: Secondary | ICD-10-CM | POA: Diagnosis not present

## 2020-11-23 IMAGING — US US RENAL
1 series · 14 of 25 positions shown · non-contrast
Comparison: CT dated November 02, 2017.

CLINICAL DATA: Hydronephrosis.  Kidney stone.

EXAM:
RENAL / URINARY TRACT ULTRASOUND COMPLETE

[Series 1: us renal · 14 of 63 slices shown]
[im 1/63]
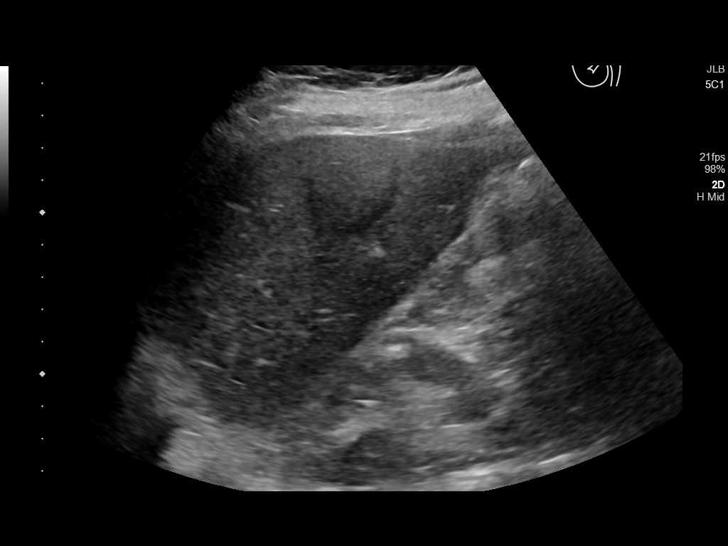
[im 6/63]
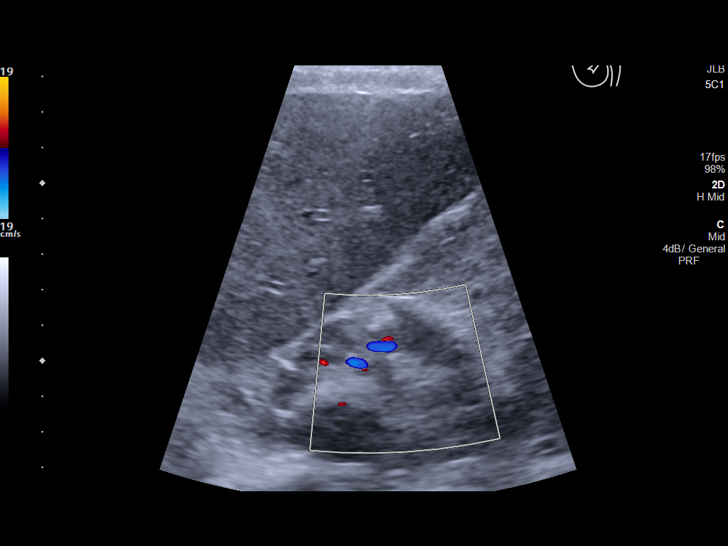
[im 11/63]
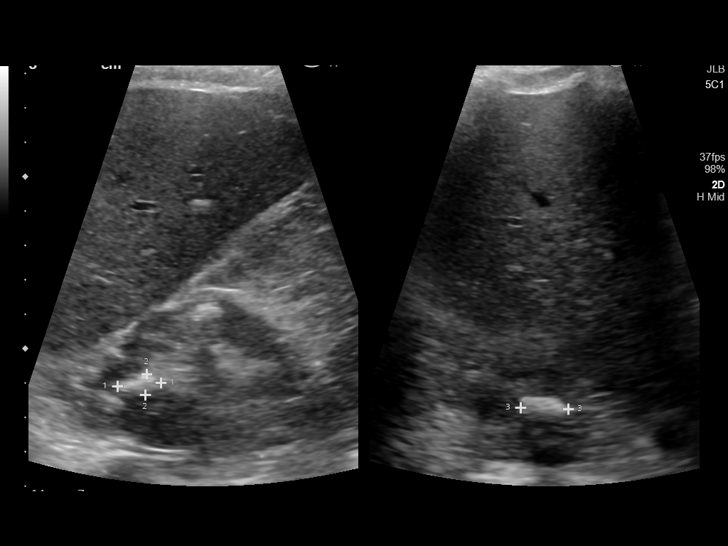
[im 16/63]
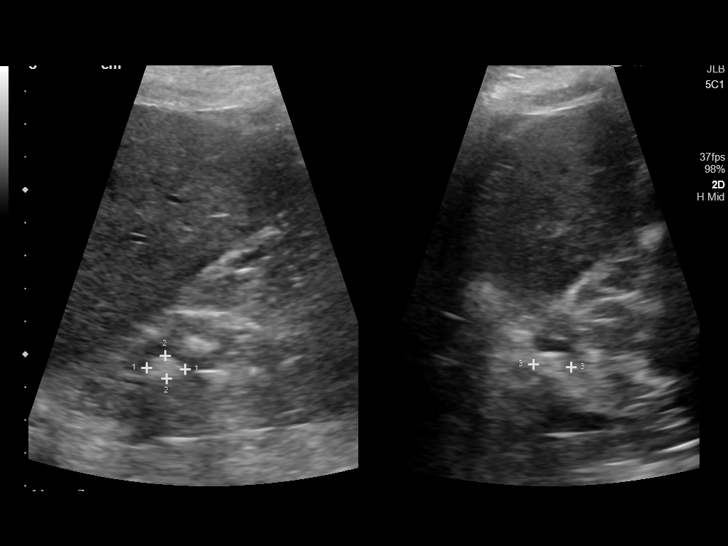
[im 21/63]
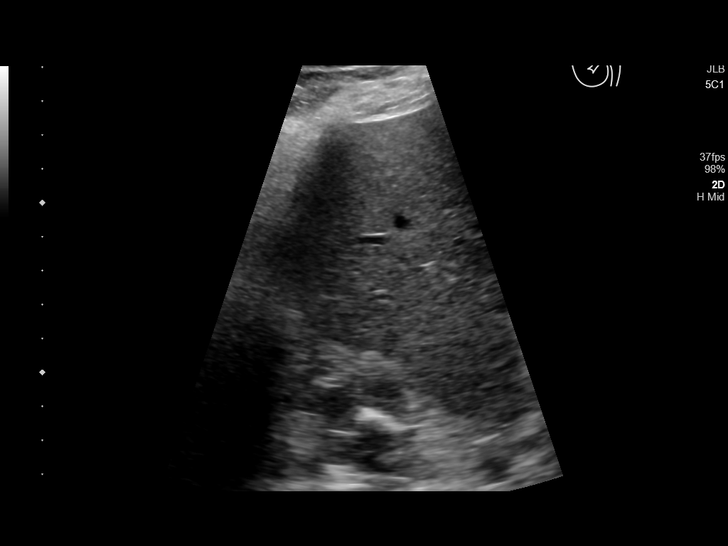
[im 24/63]
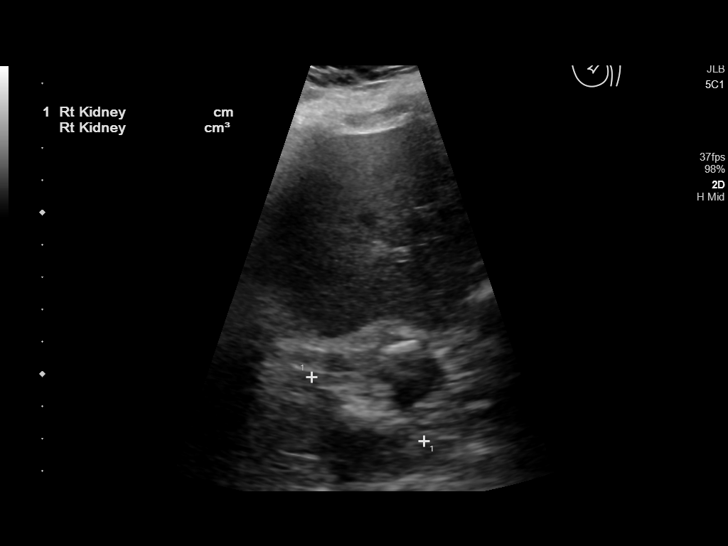
[im 29/63]
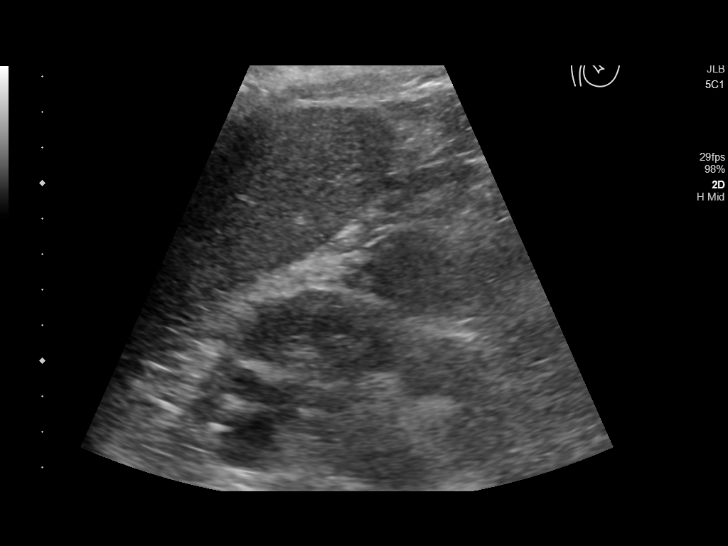
[im 34/63]
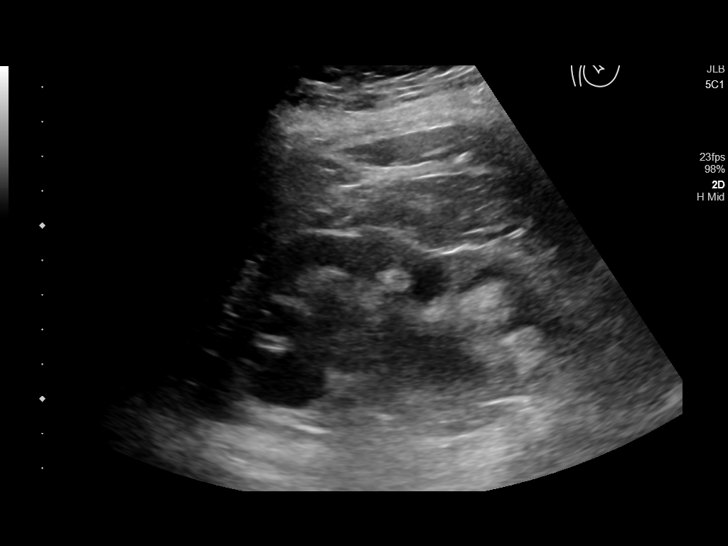
[im 39/63]
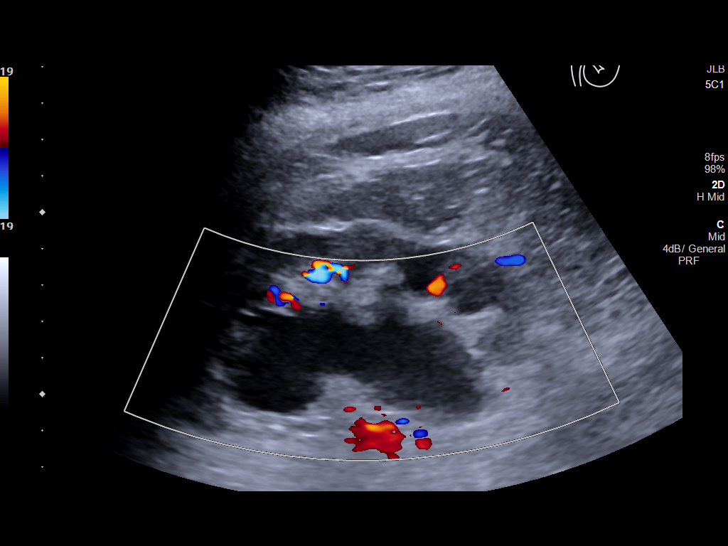
[im 42/63]
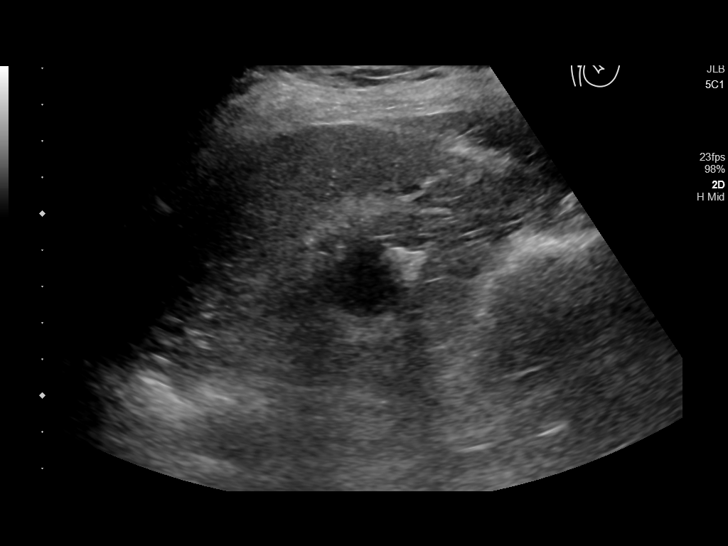
[im 47/63]
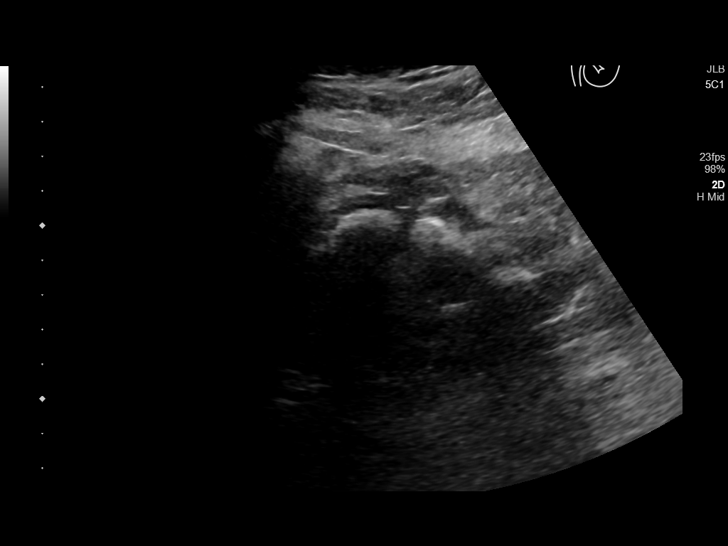
[im 52/63]
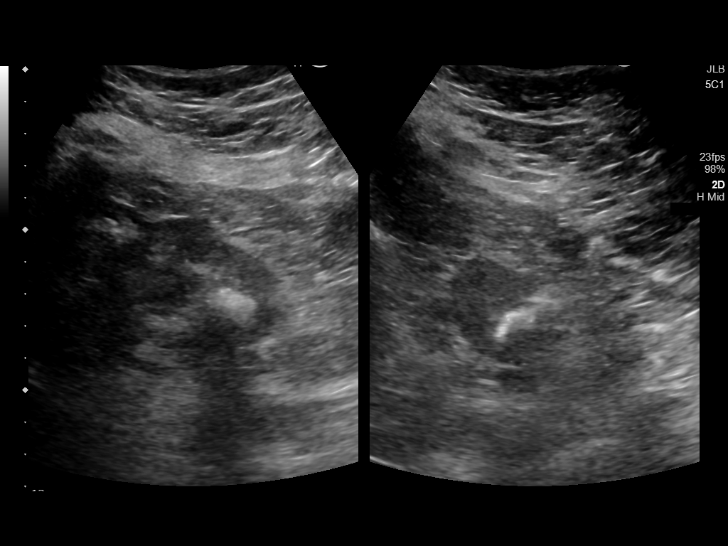
[im 57/63]
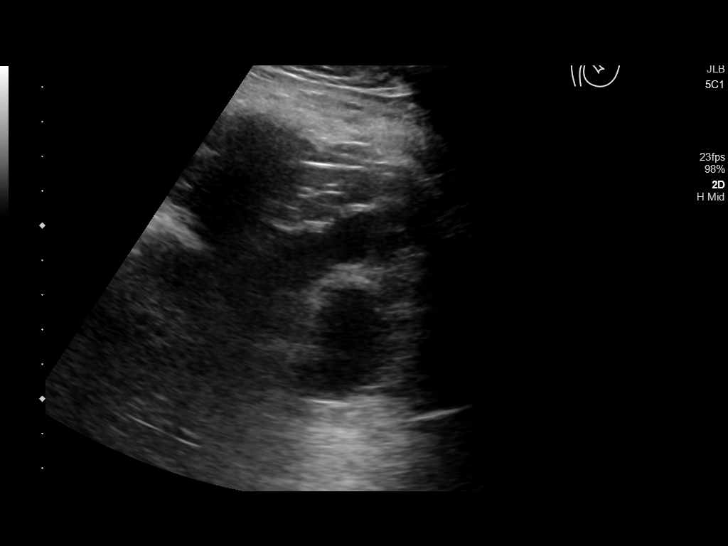
[im 63/63]
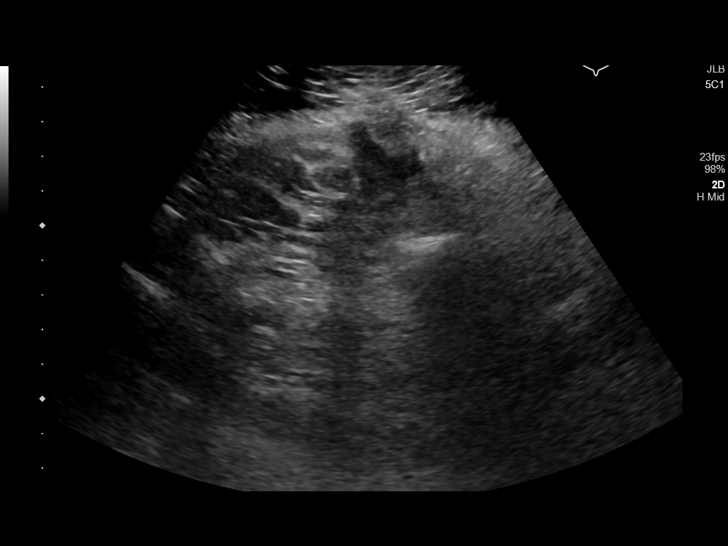

[14 of 25 positions shown; findings below may reference images not displayed]

FINDINGS: Right Kidney:

Renal measurements: 6.7 x 3.3 x 4 cm = volume: 47 mL. Multiple
shadowing stones are noted throughout the right kidney. The right
kidney is atrophic with extensive cortical cysts thinning and
scarring. There is likely a staghorn calculus in the upper pole as
visualized on prior CT.

Left Kidney:

Renal measurements: 10.1 x 5.5 x 5 cm = volume: 146 mL. The left
kidney is atrophic. Multiple shadowing echogenic kidney stones are
noted measuring up to approximately 1.6 cm. There appears to be a
large staghorn calculus. Again noted is severe hydronephrosis.

Bladder:

The bladder was not identified.
IMPRESSION: 1. Extensive bilateral kidney stones as detailed above including
bilateral staghorn calculi.
2. Atrophic kidneys bilaterally with extensive areas of cortical
thinning and scarring.
3. Severe left-sided hydronephrosis. This is likely similar to CT
dated November 02, 2017 given differences in modality.
4. Bladder not visualized.

## 2020-11-23 IMAGING — CR ABDOMEN - 1 VIEW
1 series · 1 of 1 positions shown · non-contrast
Comparison: CT from 8271

CLINICAL DATA: Kidney stones

EXAM:
ABDOMEN - 1 VIEW

[dg abd 1 view]
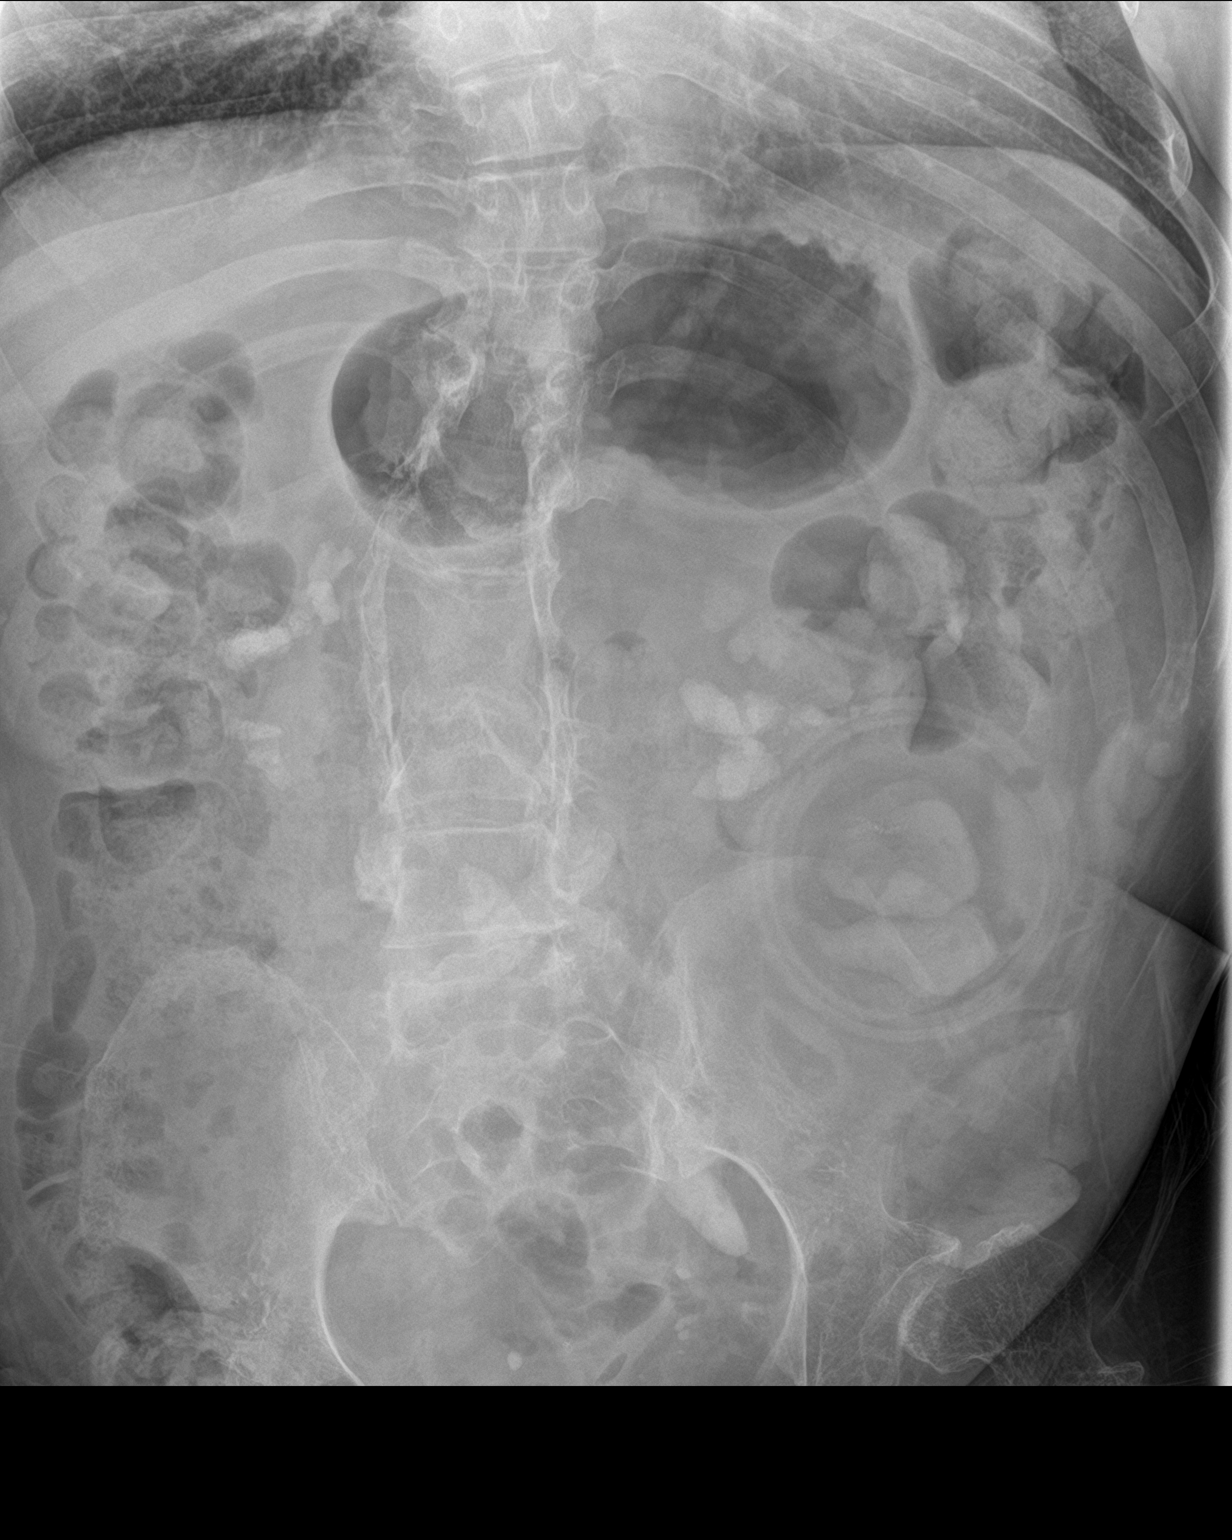

[1 of 1 positions shown; findings below may reference images not displayed]

FINDINGS: There are large bilateral staghorn calculi. The stone on the right
measures approximately 4 cm. The stone on the left measures
approximately 3.6 x 3.1 cm. There is likely an ostomy in the left
lower quadrant. There is a large amount of stool throughout the
colon. Chronic changes are seen involving the lumbar spine bilateral
hips. There are calcifications that project over the patient's
pelvis.
IMPRESSION: 1. Large bilateral staghorn calculi.
2. Large stool burden.

## 2020-11-27 DIAGNOSIS — Q059 Spina bifida, unspecified: Secondary | ICD-10-CM | POA: Diagnosis not present

## 2020-11-27 DIAGNOSIS — Z936 Other artificial openings of urinary tract status: Secondary | ICD-10-CM | POA: Diagnosis not present

## 2020-11-27 DIAGNOSIS — C679 Malignant neoplasm of bladder, unspecified: Secondary | ICD-10-CM | POA: Diagnosis not present

## 2020-12-02 DIAGNOSIS — Q059 Spina bifida, unspecified: Secondary | ICD-10-CM | POA: Diagnosis not present

## 2020-12-11 DIAGNOSIS — E785 Hyperlipidemia, unspecified: Secondary | ICD-10-CM | POA: Diagnosis not present

## 2020-12-11 DIAGNOSIS — R8290 Unspecified abnormal findings in urine: Secondary | ICD-10-CM | POA: Diagnosis not present

## 2020-12-11 DIAGNOSIS — E559 Vitamin D deficiency, unspecified: Secondary | ICD-10-CM | POA: Diagnosis not present

## 2020-12-11 DIAGNOSIS — Z79899 Other long term (current) drug therapy: Secondary | ICD-10-CM | POA: Diagnosis not present

## 2020-12-11 DIAGNOSIS — Z0000 Encounter for general adult medical examination without abnormal findings: Secondary | ICD-10-CM | POA: Diagnosis not present

## 2020-12-11 DIAGNOSIS — D649 Anemia, unspecified: Secondary | ICD-10-CM | POA: Diagnosis not present

## 2020-12-11 DIAGNOSIS — Z125 Encounter for screening for malignant neoplasm of prostate: Secondary | ICD-10-CM | POA: Diagnosis not present

## 2021-02-24 DIAGNOSIS — C679 Malignant neoplasm of bladder, unspecified: Secondary | ICD-10-CM | POA: Diagnosis not present

## 2021-02-24 DIAGNOSIS — Z936 Other artificial openings of urinary tract status: Secondary | ICD-10-CM | POA: Diagnosis not present

## 2021-02-24 DIAGNOSIS — Q059 Spina bifida, unspecified: Secondary | ICD-10-CM | POA: Diagnosis not present

## 2021-03-05 DIAGNOSIS — Q059 Spina bifida, unspecified: Secondary | ICD-10-CM | POA: Diagnosis not present

## 2021-03-05 DIAGNOSIS — Z936 Other artificial openings of urinary tract status: Secondary | ICD-10-CM | POA: Diagnosis not present

## 2021-03-05 DIAGNOSIS — C679 Malignant neoplasm of bladder, unspecified: Secondary | ICD-10-CM | POA: Diagnosis not present

## 2021-03-07 ENCOUNTER — Encounter: Payer: Self-pay | Admitting: Emergency Medicine

## 2021-03-07 ENCOUNTER — Emergency Department: Payer: Medicare HMO

## 2021-03-07 ENCOUNTER — Inpatient Hospital Stay
Admission: EM | Admit: 2021-03-07 | Discharge: 2021-03-11 | DRG: 871 | Disposition: A | Discharge status: Home / Self Care | Payer: Medicare HMO | Attending: Internal Medicine | Admitting: Internal Medicine

## 2021-03-07 ENCOUNTER — Other Ambulatory Visit: Payer: Self-pay

## 2021-03-07 DIAGNOSIS — Z933 Colostomy status: Secondary | ICD-10-CM

## 2021-03-07 DIAGNOSIS — I129 Hypertensive chronic kidney disease with stage 1 through stage 4 chronic kidney disease, or unspecified chronic kidney disease: Secondary | ICD-10-CM | POA: Diagnosis present

## 2021-03-07 DIAGNOSIS — B9689 Other specified bacterial agents as the cause of diseases classified elsewhere: Secondary | ICD-10-CM | POA: Diagnosis present

## 2021-03-07 DIAGNOSIS — I714 Abdominal aortic aneurysm, without rupture: Secondary | ICD-10-CM | POA: Diagnosis present

## 2021-03-07 DIAGNOSIS — S73005A Unspecified dislocation of left hip, initial encounter: Secondary | ICD-10-CM | POA: Diagnosis not present

## 2021-03-07 DIAGNOSIS — E782 Mixed hyperlipidemia: Secondary | ICD-10-CM | POA: Diagnosis present

## 2021-03-07 DIAGNOSIS — N136 Pyonephrosis: Secondary | ICD-10-CM | POA: Diagnosis present

## 2021-03-07 DIAGNOSIS — E872 Acidosis: Secondary | ICD-10-CM | POA: Diagnosis present

## 2021-03-07 DIAGNOSIS — D631 Anemia in chronic kidney disease: Secondary | ICD-10-CM | POA: Diagnosis present

## 2021-03-07 DIAGNOSIS — Q7649 Other congenital malformations of spine, not associated with scoliosis: Secondary | ICD-10-CM | POA: Diagnosis not present

## 2021-03-07 DIAGNOSIS — Z87440 Personal history of urinary (tract) infections: Secondary | ICD-10-CM | POA: Diagnosis not present

## 2021-03-07 DIAGNOSIS — R6521 Severe sepsis with septic shock: Secondary | ICD-10-CM | POA: Diagnosis present

## 2021-03-07 DIAGNOSIS — N319 Neuromuscular dysfunction of bladder, unspecified: Secondary | ICD-10-CM | POA: Diagnosis present

## 2021-03-07 DIAGNOSIS — E876 Hypokalemia: Secondary | ICD-10-CM | POA: Diagnosis present

## 2021-03-07 DIAGNOSIS — A419 Sepsis, unspecified organism: Principal | ICD-10-CM | POA: Diagnosis present

## 2021-03-07 DIAGNOSIS — Z91040 Latex allergy status: Secondary | ICD-10-CM

## 2021-03-07 DIAGNOSIS — M439 Deforming dorsopathy, unspecified: Secondary | ICD-10-CM | POA: Diagnosis not present

## 2021-03-07 DIAGNOSIS — G8220 Paraplegia, unspecified: Secondary | ICD-10-CM | POA: Diagnosis present

## 2021-03-07 DIAGNOSIS — Z885 Allergy status to narcotic agent status: Secondary | ICD-10-CM | POA: Diagnosis not present

## 2021-03-07 DIAGNOSIS — N390 Urinary tract infection, site not specified: Secondary | ICD-10-CM | POA: Diagnosis not present

## 2021-03-07 DIAGNOSIS — Z91041 Radiographic dye allergy status: Secondary | ICD-10-CM

## 2021-03-07 DIAGNOSIS — N1831 Chronic kidney disease, stage 3a: Secondary | ICD-10-CM | POA: Diagnosis present

## 2021-03-07 DIAGNOSIS — Z882 Allergy status to sulfonamides status: Secondary | ICD-10-CM | POA: Diagnosis not present

## 2021-03-07 DIAGNOSIS — B9562 Methicillin resistant Staphylococcus aureus infection as the cause of diseases classified elsewhere: Secondary | ICD-10-CM | POA: Diagnosis present

## 2021-03-07 DIAGNOSIS — K219 Gastro-esophageal reflux disease without esophagitis: Secondary | ICD-10-CM | POA: Diagnosis present

## 2021-03-07 DIAGNOSIS — Z20822 Contact with and (suspected) exposure to covid-19: Secondary | ICD-10-CM | POA: Diagnosis present

## 2021-03-07 DIAGNOSIS — N179 Acute kidney failure, unspecified: Secondary | ICD-10-CM | POA: Diagnosis present

## 2021-03-07 DIAGNOSIS — N189 Chronic kidney disease, unspecified: Secondary | ICD-10-CM

## 2021-03-07 DIAGNOSIS — Z936 Other artificial openings of urinary tract status: Secondary | ICD-10-CM | POA: Diagnosis not present

## 2021-03-07 DIAGNOSIS — Q059 Spina bifida, unspecified: Secondary | ICD-10-CM | POA: Diagnosis not present

## 2021-03-07 DIAGNOSIS — Z79899 Other long term (current) drug therapy: Secondary | ICD-10-CM

## 2021-03-07 DIAGNOSIS — R509 Fever, unspecified: Secondary | ICD-10-CM | POA: Diagnosis not present

## 2021-03-07 DIAGNOSIS — A4150 Gram-negative sepsis, unspecified: Secondary | ICD-10-CM | POA: Diagnosis present

## 2021-03-07 DIAGNOSIS — L0231 Cutaneous abscess of buttock: Secondary | ICD-10-CM | POA: Diagnosis present

## 2021-03-07 DIAGNOSIS — R000 Tachycardia, unspecified: Secondary | ICD-10-CM | POA: Diagnosis not present

## 2021-03-07 DIAGNOSIS — N132 Hydronephrosis with renal and ureteral calculous obstruction: Secondary | ICD-10-CM | POA: Diagnosis not present

## 2021-03-07 DIAGNOSIS — R110 Nausea: Secondary | ICD-10-CM | POA: Diagnosis not present

## 2021-03-07 LAB — CBC WITH DIFFERENTIAL/PLATELET
Abs Immature Granulocytes: 0.12 10*3/uL — ABNORMAL HIGH (ref 0.00–0.07)
Basophils Absolute: 0.1 10*3/uL (ref 0.0–0.1)
Basophils Relative: 1 %
Eosinophils Absolute: 0 10*3/uL (ref 0.0–0.5)
Eosinophils Relative: 0 %
HCT: 44.6 % (ref 39.0–52.0)
Hemoglobin: 14.2 g/dL (ref 13.0–17.0)
Immature Granulocytes: 1 %
Lymphocytes Relative: 6 %
Lymphs Abs: 1.2 10*3/uL (ref 0.7–4.0)
MCH: 27.6 pg (ref 26.0–34.0)
MCHC: 31.8 g/dL (ref 30.0–36.0)
MCV: 86.8 fL (ref 80.0–100.0)
Monocytes Absolute: 1.8 10*3/uL — ABNORMAL HIGH (ref 0.1–1.0)
Monocytes Relative: 9 %
Neutro Abs: 17.4 10*3/uL — ABNORMAL HIGH (ref 1.7–7.7)
Neutrophils Relative %: 83 %
Platelets: 318 10*3/uL (ref 150–400)
RBC: 5.14 MIL/uL (ref 4.22–5.81)
RDW: 15.2 % (ref 11.5–15.5)
WBC: 20.6 10*3/uL — ABNORMAL HIGH (ref 4.0–10.5)
nRBC: 0 % (ref 0.0–0.2)

## 2021-03-07 LAB — COMPREHENSIVE METABOLIC PANEL
ALT: 19 U/L (ref 0–44)
AST: 27 U/L (ref 15–41)
Albumin: 3.7 g/dL (ref 3.5–5.0)
Alkaline Phosphatase: 87 U/L (ref 38–126)
Anion gap: 18 — ABNORMAL HIGH (ref 5–15)
BUN: 27 mg/dL — ABNORMAL HIGH (ref 6–20)
CO2: 26 mmol/L (ref 22–32)
Calcium: 9.4 mg/dL (ref 8.9–10.3)
Chloride: 91 mmol/L — ABNORMAL LOW (ref 98–111)
Creatinine, Ser: 1.65 mg/dL — ABNORMAL HIGH (ref 0.61–1.24)
GFR, Estimated: 49 mL/min — ABNORMAL LOW (ref >60)
Glucose, Bld: 98 mg/dL (ref 70–99)
Potassium: 2.8 mmol/L — ABNORMAL LOW (ref 3.5–5.1)
Sodium: 135 mmol/L (ref 135–145)
Total Bilirubin: 1.3 mg/dL — ABNORMAL HIGH (ref 0.3–1.2)
Total Protein: 8.6 g/dL — ABNORMAL HIGH (ref 6.5–8.1)

## 2021-03-07 MED ORDER — ACETAMINOPHEN 500 MG PO TABS
1000.0000 mg | ORAL_TABLET | Freq: Once | ORAL | Status: AC
  Administered 2021-03-07: 1000 mg via ORAL
  Filled 2021-03-07: qty 2

## 2021-03-07 MED ORDER — SODIUM CHLORIDE 0.9 % IV BOLUS
1000.0000 mL | Freq: Once | INTRAVENOUS | Status: AC
  Administered 2021-03-07: 1000 mL via INTRAVENOUS

## 2021-03-07 MED ORDER — VANCOMYCIN HCL 1250 MG/250ML IV SOLN
1250.0000 mg | Freq: Once | INTRAVENOUS | Status: AC
  Administered 2021-03-08: 1250 mg via INTRAVENOUS
  Filled 2021-03-07: qty 250

## 2021-03-07 MED ORDER — VANCOMYCIN HCL IN DEXTROSE 1-5 GM/200ML-% IV SOLN: 1000.0000 mg | Freq: Once | INTRAVENOUS | Status: DC

## 2021-03-07 MED ORDER — LACTATED RINGERS IV SOLN: INTRAVENOUS | Status: AC

## 2021-03-07 MED ORDER — SODIUM CHLORIDE 0.9 % IV SOLN
2.0000 g | Freq: Once | INTRAVENOUS | Status: AC
  Administered 2021-03-07: 2 g via INTRAVENOUS
  Filled 2021-03-07: qty 2

## 2021-03-07 MED ORDER — LACTATED RINGERS IV BOLUS (SEPSIS)
1000.0000 mL | Freq: Once | INTRAVENOUS | Status: AC
  Administered 2021-03-07: 1000 mL via INTRAVENOUS

## 2021-03-07 NOTE — Progress Notes (Signed)
CODE SEPSIS - PHARMACY COMMUNICATION  **Broad Spectrum Antibiotics should be administered within 1 hour of Sepsis diagnosis**  Time Code Sepsis Called/Page Received: 2325  Antibiotics Ordered: Cefepime and Vancomycin  Time of 1st antibiotic administration: 2358  [name], PharmD, [facility] 03/07/2021 11:41 PM

## 2021-03-07 NOTE — ED Provider Notes (Addendum)
[facility] Emergency Department Provider Note  ____________________________________________   Event Date/Time   First MD Initiated Contact with Patient 03/07/21 2305     (approximate)  I have reviewed the triage vital signs and the nursing notes.   HISTORY  Chief Complaint Nausea and Fever    HPI Jessie Schrieber is a 55 y.o. male with history of spina bifida, previous urinary tract infections and sepsis who presents to the emergency department with complaints of abdominal pain, nausea, subjective fevers for the past several days.  States he thinks he has a "kidney infection".  He denies any vomiting.  Has a colostomy and urostomy.  States his urine has appeared dark and he has seen some blood as well.  No chest pain, shortness of breath, cough.        Past Medical History:  Diagnosis Date  . Decubitus ulcer   . Renal disorder   . Spina bifida [facility])     Patient Active Problem List   Diagnosis Date Noted  . Sepsis due to gram-negative UTI ([place]) 03/08/2021  . Left ureteral stone 06/27/2020  . Complicated UTI (urinary tract infection) 06/27/2020  . HTN (hypertension) 06/27/2020  . Acute renal failure superimposed on stage 3a chronic kidney disease ([place]) 06/27/2020  . Spina bifida ([place])   . Hydronephrosis   . Kidney stone   . Hyperlipidemia   . Hiccups   . Acute lower UTI 12/22/2019  . GERD (gastroesophageal reflux disease) 12/22/2019  . SBO (small bowel obstruction) ([place]) 11/02/2017  . Abdominal distention 10/11/2016  . Distal intestinal obstruction syndrome ([place]) 10/11/2016  . Hypokalemia 10/11/2016  . Leukocytosis 10/11/2016  . Loose stools 10/11/2016  . Fecal impaction in rectum ([place]) 10/10/2016  . Nausea & vomiting 06/03/2016  . Gastric outlet obstruction 06/03/2016  . SIRS (systemic inflammatory response syndrome) ([place]) 06/03/2016  . Pressure ulcer 06/03/2016  . Impaction of the bowels ([place])   . Gastritis   . Ulcer of esophagus  with bleeding   . Nausea   . Nausea vomiting and diarrhea   . Sepsis ([place]) 04/21/2016    Past Surgical History:  Procedure Laterality Date  . ABDOMINAL SURGERY    . BACK SURGERY    . COLON SURGERY    . ESOPHAGOGASTRODUODENOSCOPY (EGD) WITH PROPOFOL N/A 06/03/2016   Procedure: ESOPHAGOGASTRODUODENOSCOPY (EGD) WITH PROPOFOL;  Surgeon: [name], MD;  Location: [facility] ENDOSCOPY;  Service: Endoscopy;  Laterality: N/A;  . ESOPHAGOGASTRODUODENOSCOPY (EGD) WITH PROPOFOL N/A 04/27/2019   Procedure: ESOPHAGOGASTRODUODENOSCOPY (EGD) WITH PROPOFOL;  Surgeon: Jonathon Bellows, MD;  Location: [facility] ENDOSCOPY;  Service: Gastroenterology;  Laterality: N/A;  . ilieostomy    . VENTRICULOPERITONEAL SHUNT    . vp shunt removal      Prior to Admission medications   Medication Sig Start Date End Date Taking? Authorizing Provider  Cranberry-Vitamin C-Probiotic (AZO CRANBERRY PO) Take 1 tablet by mouth daily.   Yes [provider]  dicyclomine (BENTYL) 10 MG capsule Take 10 mg by mouth 3 (three) times daily. 02/28/20  Yes [provider]  hydrochlorothiazide (HYDRODIURIL) 25 MG tablet Take 25 mg by mouth daily. 06/19/20  Yes [provider]  Multiple Vitamins-Minerals (CENTRUM MEN PO) Take 1 tablet by mouth daily.    Yes [provider]  pantoprazole (PROTONIX) 40 MG tablet Take 1 tablet (40 mg total) by mouth daily. 04/29/19 06/27/20 Yes Sainani, Belia Heman, MD  potassium chloride SA (KLOR-CON) 20 MEQ tablet Take 20 mEq by mouth daily. 05/28/20  Yes [provider]  rosuvastatin (CRESTOR) 10 MG tablet Take 10 mg by mouth at bedtime. 10/20/19  Yes [provider]  sucralfate (CARAFATE) 1 g tablet Take 1 g by mouth 3 (three) times daily. 12/14/19  Yes [provider]  traZODone (DESYREL) 50 MG tablet Take 50 mg by mouth at bedtime. 02/15/21  Yes [provider]    Allergies Morphine and related, Sulfa antibiotics, Ivp dye [iodinated diagnostic agents],  and Latex  Family History  Problem Relation Age of Onset  . Diabetes Mellitus II Father     Social History Social History   Tobacco Use  . Smoking status: Never Smoker  . Smokeless tobacco: Never Used  Substance Use Topics  . Alcohol use: No  . Drug use: No    Review of Systems Constitutional: Subjective fever. Eyes: No visual changes. ENT: No sore throat. Cardiovascular: Denies chest pain. Respiratory: Denies shortness of breath. Gastrointestinal: + Nausea.  No vomiting. Genitourinary: Negative for dysuria. Musculoskeletal: Negative for back pain. Skin: Negative for rash. Neurological: Negative for focal weakness or numbness.  ____________________________________________   PHYSICAL EXAM:  VITAL SIGNS: ED Triage Vitals  Enc Vitals Group     BP 03/07/21 2207 100/80     Pulse Rate 03/07/21 2207 (!) 108     Resp 03/07/21 2207 18     Temp 03/07/21 2156 99.2 F (37.3 C)     Temp Source 03/07/21 2156 Axillary     SpO2 03/07/21 2152 97 %     Weight 03/07/21 2154 121 lb 4.1 oz (55 kg)     Height 03/07/21 2154 4\' 6"  (1.372 m)     Head Circumference --      Peak Flow --      Pain Score 03/07/21 2154 3     Pain Loc --      Pain Edu? --      Excl. in [place]? --    CONSTITUTIONAL: Alert and oriented and responds appropriately to questions.  Chronically ill-appearing HEAD: Normocephalic EYES: Conjunctivae clear, pupils appear equal, EOM appear intact ENT: normal nose; moist mucous membranes NECK: Supple, normal ROM CARD: Regular and tachycardic; S1 and S2 appreciated; no murmurs, no clicks, no rubs, no gallops RESP: Normal chest excursion without splinting or tachypnea; breath sounds clear and equal bilaterally; no wheezes, no rhonchi, no rales, no hypoxia or respiratory distress, speaking full sentences ABD/GI: Normal bowel sounds; non-distended; soft, mildly tender throughout the abdomen, no rebound, no guarding, no peritoneal signs, no hepatosplenomegaly, no sacral  decubitus ulcers; urostomy noted to the right lower quadrant with dark appearing urine without gross hematuria and no surrounding redness, warmth or drainage around the bag, colostomy noted to the left lower quadrant with normal-appearing brown stool without blood or melena with no surrounding redness, warmth or drainage around the bag BACK: Spina bifida EXT: Normal ROM in all joints; no deformity noted, no edema; no cyanosis SKIN: Normal color for age and race; warm; no rash on exposed skin NEURO: Normal speech, no facial asymmetry PSYCH: The patient's mood and manner are appropriate.  ____________________________________________   LABS (all labs ordered are listed, but only abnormal results are displayed)  Labs Reviewed  COMPREHENSIVE METABOLIC PANEL - Abnormal; Notable for the following components:      Result Value   Potassium 2.8 (*)    Chloride 91 (*)    BUN 27 (*)    Creatinine, Ser 1.65 (*)    Total Protein 8.6 (*)    Total Bilirubin 1.3 (*)  GFR, Estimated 49 (*)    Anion gap 18 (*)    All other components within normal limits  LACTIC ACID, PLASMA - Abnormal; Notable for the following components:   Lactic Acid, Venous 2.4 (*)    All other components within normal limits  URINALYSIS, COMPLETE (UACMP) WITH MICROSCOPIC - Abnormal; Notable for the following components:   Color, Urine YELLOW (*)    APPearance CLOUDY (*)    Hgb urine dipstick LARGE (*)    Ketones, ur 5 (*)    Protein, ur 100 (*)    Nitrite POSITIVE (*)    Leukocytes,Ua LARGE (*)    RBC / HPF >50 (*)    WBC, UA >50 (*)    Bacteria, UA MANY (*)    Crystals PRESENT (*)    All other components within normal limits  CBC WITH DIFFERENTIAL/PLATELET - Abnormal; Notable for the following components:   WBC 20.6 (*)    Neutro Abs 17.4 (*)    Monocytes Absolute 1.8 (*)    Abs Immature Granulocytes 0.12 (*)    All other components within normal limits  MAGNESIUM - Abnormal; Notable for the following components:    Magnesium 2.6 (*)    All other components within normal limits  RESP PANEL BY RT-PCR (FLU A&B, COVID) [id number]  URINE CULTURE  CULTURE, BLOOD (SINGLE)  CULTURE, BLOOD (SINGLE)  LACTIC ACID, PLASMA  LIPASE, BLOOD   ____________________________________________  EKG   EKG Interpretation  Date/Time:  Saturday March 07 2021 22:16:29 EDT Ventricular Rate:  106 PR Interval:  157 QRS Duration: 84 QT Interval:  327 QTC Calculation: 435 R Axis:   240 Text Interpretation: Sinus tachycardia Multiple ventricular premature complexes Biatrial enlargement Left anterior fascicular block Confirmed by Pryor Curia [phone]) on 03/07/2021 11:50:16 PM       ____________________________________________  RADIOLOGY Jessie Foot Ahmeer Tuman, personally viewed and evaluated these images (plain radiographs) as part of my medical decision making, as well as reviewing the written report by the radiologist.  ED MD interpretation: Chest x-ray clear.  CT of the abdomen pelvis shows no bowel obstruction, pyelonephritis.  Official radiology report(s): CT ABDOMEN PELVIS WO CONTRAST  Result Date: 03/08/2021 CLINICAL DATA:  Fever and nausea. EXAM: CT ABDOMEN AND PELVIS WITHOUT CONTRAST TECHNIQUE: Multidetector CT imaging of the abdomen and pelvis was performed following the standard protocol without IV contrast. COMPARISON:  June 27, 2020 FINDINGS: Lower chest: No acute abnormality. Hepatobiliary: No focal liver abnormality is seen. No gallstones, gallbladder wall thickening, or biliary dilatation. Pancreas: Unremarkable. No pancreatic ductal dilatation or surrounding inflammatory changes. Spleen: Normal in size without focal abnormality. Adrenals/Urinary Tract: Adrenal glands are unremarkable. The kidneys are lobulated in appearance. Multiple parenchymal calcifications are seen along the periphery of the right kidney. A large renal calculus is seen involving the upper pole calices on the right. This extends into the right  renal pelvis and is increased in size when compared to the prior study. Mild right-sided hydronephrosis is noted. A 2.0 cm x 1.9 cm staghorn type calculus is also seen within the lower pole of the left kidney. Large clusters of renal calculi are again seen within dilated calices within the mid left kidney. Persistent left-sided hydronephrosis is seen to the level just beyond the left UPJ. The urinary bladder is empty and subsequently limited in evaluation. There is evidence of prior urinary diversion and subsequent right ileostomy. Stomach/Bowel: Stomach is within normal limits. The appendix is not identified. A moderate amount of stool is seen throughout the large  bowel. No evidence of bowel dilatation. Vascular/Lymphatic: No significant vascular findings are present. No enlarged abdominal or pelvic lymph nodes. Reproductive: Prostate is unremarkable. Other: A left lower quadrant ostomy site is seen. This is seen on the prior study. Musculoskeletal: There is chronic dislocation of the left hip with associated dysplasia of the left hip joint and remodeling of the left femoral head. A chronic left hip effusion is seen. 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 IMPRESSION: 1. Large bilateral staghorn calculi, increased in size when compared to the prior study. 2. Persistent left-sided hydronephrosis with suspected proximal left ureteral stricture. 3. Stable postoperative changes with subsequent left lower quadrant ostomy site and right-sided ileostomy. 4. Chronic changes involving the left hip with chronic and congenital deformity is seen throughout the lumbar spine. Electronically Signed   By: Virgina Norfolk M.D.   On: 03/08/2021 00:50   DG Chest Portable 1 View  Result Date: 03/08/2021 CLINICAL DATA:  Nausea and fever. EXAM: PORTABLE CHEST 1 VIEW COMPARISON:  December 22, 2019 FINDINGS: There is no evidence of acute infiltrate, pleural effusion or  pneumothorax. The heart size and mediastinal contours are within normal limits. Stable scoliosis of the thoracolumbar spine is seen with chronic left upper rib deformities noted. IMPRESSION: Stable exam without acute cardiopulmonary disease. Electronically Signed   By: Virgina Norfolk M.D.   On: 03/08/2021 00:52    ____________________________________________   PROCEDURES  Procedure(s) performed (including Critical Care):  Procedures  CRITICAL CARE Performed by: Cyril Mourning Kiosha Buchan   Total critical care time: 85 minutes  Critical care time was exclusive of separately billable procedures and treating other patients.  Critical care was necessary to treat or prevent imminent or life-threatening deterioration.  Critical care was time spent personally by me on the following activities: development of treatment plan with patient and/or surrogate as well as nursing, discussions with consultants, evaluation of patient's response to treatment, examination of patient, obtaining history from patient or surrogate, ordering and performing treatments and interventions, ordering and review of laboratory studies, ordering and review of radiographic studies, pulse oximetry and re-evaluation of patient's condition.  ____________________________________________   INITIAL IMPRESSION / ASSESSMENT AND PLAN / ED COURSE  As part of my medical decision making, I reviewed the following data within the [facility] notes reviewed and incorporated, Labs reviewed , EKG interpreted , Old EKG reviewed, Old chart reviewed, Radiograph reviewed , Discussed with admitting physician  and Notes from prior ED visits         Patient here with concerns for sepsis.  Has had sepsis from urinary tract infections and bacteremia before.  It appears he has grown Staphylococcus as well as Providencia.  We will start vancomycin and cefepime.  Will obtain labs, cultures, urine, chest x-ray, COVID swab.  He  denies any chest pain, shortness of breath or cough.  Does have some abdominal tenderness on exam.  We will proceed with CT of the abdomen pelvis as he is also had a history of bowel obstruction.  Patient is febrile, tachycardic and mildly hypotensive here.  He is receiving 66mL/mg IV fluid bolus.  He will need admission.  ED PROGRESS  Patient is a leukocytosis of 20,000 with left shift.  Potassium slightly low at 2.8.  Will give IV replacement.  Magnesium level normal.  No EKG changes noted.  Chest x-ray clear.  COVID and flu negative.  Urine appears infected.  Initial lactate elevated at 2.4 but  has improved with IV fluids as well as his blood pressure.  CT of his abdomen pelvis shows large bilateral staghorn calculi and persistent left-sided hydronephrosis from a suspected proximal left ureteral stricture.  No other acute changes noted.  Will discuss with hospitalist for admission.  Patient does have a slight AKI today.  Receiving IV fluids.  1:45 AM  Discussed patient's case with hospitalist, Dr. Sidney Ace.  I have recommended admission and patient (and family if present) agree with this plan. Admitting physician will place admission orders.   I reviewed all nursing notes, vitals, pertinent previous records and reviewed/interpreted all EKGs, lab and urine results, imaging (as available).  2:30 AM  Pt's blood pressure has began to drop with pressures in the 00F to 74B systolic.  Still mentating normally.  Will give third liter of IV fluid and start IV Levophed.  Discussed with ICU provider [place] for admission.  Hospitalist updated. ____________________________________________   FINAL CLINICAL IMPRESSION(S) / ED DIAGNOSES  Final diagnoses:  Acute sepsis ([place])  Acute UTI  AKI (acute kidney injury) ([place])  Hypokalemia     ED Discharge Orders    None      *Please note:  Jobie Popp was evaluated in Emergency Department on 03/08/2021 for the symptoms described in the  history of present illness. He was evaluated in the context of the global COVID-19 pandemic, which necessitated consideration that the patient might be at risk for infection with the SARS-CoV-2 virus that causes COVID-19. Institutional protocols and algorithms that pertain to the evaluation of patients at risk for COVID-19 are in a state of rapid change based on information released by regulatory bodies including the CDC and federal and state organizations. These policies and algorithms were followed during the patient's care in the ED.  Some ED evaluations and interventions may be delayed as a result of limited staffing during and the pandemic.*   Note:  This document was prepared using Dragon voice recognition software and may include unintentional dictation errors.   Yena Tisby, Delice Bison, DO 03/08/21 0147    Kainat Pizana, Delice Bison, DO 03/08/21 0149    Vannessa Godown, Delice Bison, DO 03/08/21 0236

## 2021-03-07 NOTE — ED Triage Notes (Signed)
Patient arrives via [facility] from home for possible kidney infection. Patient complains of nausea and fever (although has not taken temperature at home.) Patient has hx of spina bifida with a colostomy bag and urine bag in place per EMS. Patient is AOx4.

## 2021-03-07 NOTE — Progress Notes (Signed)
PHARMACY -  BRIEF ANTIBIOTIC NOTE   Pharmacy has received consult(s) for Cefepime and Vancomcyin from an ED provider.  The patient's profile has been reviewed for ht/wt/allergies/indication/available labs.    One time order(s) placed for Cefepime 2 gm and Vancomycin 1250 per pt wt: 55 kg.  Further antibiotics/pharmacy consults should be ordered by admitting physician if indicated.                       [name], PharmD, [facility] 03/07/2021 11:42 PM

## 2021-03-07 NOTE — Progress Notes (Signed)
Elink following for Sepsis Protocol 

## 2021-03-07 NOTE — ED Notes (Signed)
John RN attempted x2 US guided IV with no success.

## 2021-03-08 DIAGNOSIS — N1831 Chronic kidney disease, stage 3a: Secondary | ICD-10-CM | POA: Diagnosis present

## 2021-03-08 DIAGNOSIS — I129 Hypertensive chronic kidney disease with stage 1 through stage 4 chronic kidney disease, or unspecified chronic kidney disease: Secondary | ICD-10-CM | POA: Diagnosis present

## 2021-03-08 DIAGNOSIS — N179 Acute kidney failure, unspecified: Secondary | ICD-10-CM | POA: Diagnosis present

## 2021-03-08 DIAGNOSIS — R110 Nausea: Secondary | ICD-10-CM | POA: Diagnosis not present

## 2021-03-08 DIAGNOSIS — L0231 Cutaneous abscess of buttock: Secondary | ICD-10-CM | POA: Diagnosis present

## 2021-03-08 DIAGNOSIS — N319 Neuromuscular dysfunction of bladder, unspecified: Secondary | ICD-10-CM | POA: Diagnosis present

## 2021-03-08 DIAGNOSIS — Q059 Spina bifida, unspecified: Secondary | ICD-10-CM | POA: Diagnosis not present

## 2021-03-08 DIAGNOSIS — Z87440 Personal history of urinary (tract) infections: Secondary | ICD-10-CM | POA: Diagnosis not present

## 2021-03-08 DIAGNOSIS — A419 Sepsis, unspecified organism: Secondary | ICD-10-CM | POA: Diagnosis present

## 2021-03-08 DIAGNOSIS — S73005A Unspecified dislocation of left hip, initial encounter: Secondary | ICD-10-CM | POA: Diagnosis not present

## 2021-03-08 DIAGNOSIS — N390 Urinary tract infection, site not specified: Secondary | ICD-10-CM

## 2021-03-08 DIAGNOSIS — E782 Mixed hyperlipidemia: Secondary | ICD-10-CM | POA: Diagnosis present

## 2021-03-08 DIAGNOSIS — N136 Pyonephrosis: Secondary | ICD-10-CM | POA: Diagnosis present

## 2021-03-08 DIAGNOSIS — R6521 Severe sepsis with septic shock: Secondary | ICD-10-CM

## 2021-03-08 DIAGNOSIS — I714 Abdominal aortic aneurysm, without rupture: Secondary | ICD-10-CM | POA: Diagnosis present

## 2021-03-08 DIAGNOSIS — A4150 Gram-negative sepsis, unspecified: Secondary | ICD-10-CM | POA: Diagnosis present

## 2021-03-08 DIAGNOSIS — Z933 Colostomy status: Secondary | ICD-10-CM | POA: Diagnosis not present

## 2021-03-08 DIAGNOSIS — B9689 Other specified bacterial agents as the cause of diseases classified elsewhere: Secondary | ICD-10-CM | POA: Diagnosis present

## 2021-03-08 DIAGNOSIS — G8220 Paraplegia, unspecified: Secondary | ICD-10-CM

## 2021-03-08 DIAGNOSIS — Q7649 Other congenital malformations of spine, not associated with scoliosis: Secondary | ICD-10-CM | POA: Diagnosis not present

## 2021-03-08 DIAGNOSIS — E876 Hypokalemia: Secondary | ICD-10-CM | POA: Diagnosis present

## 2021-03-08 DIAGNOSIS — R509 Fever, unspecified: Secondary | ICD-10-CM | POA: Diagnosis not present

## 2021-03-08 DIAGNOSIS — Z20822 Contact with and (suspected) exposure to covid-19: Secondary | ICD-10-CM | POA: Diagnosis present

## 2021-03-08 DIAGNOSIS — E872 Acidosis: Secondary | ICD-10-CM | POA: Diagnosis present

## 2021-03-08 DIAGNOSIS — D631 Anemia in chronic kidney disease: Secondary | ICD-10-CM | POA: Diagnosis present

## 2021-03-08 DIAGNOSIS — Z936 Other artificial openings of urinary tract status: Secondary | ICD-10-CM | POA: Diagnosis not present

## 2021-03-08 DIAGNOSIS — N132 Hydronephrosis with renal and ureteral calculous obstruction: Secondary | ICD-10-CM | POA: Diagnosis not present

## 2021-03-08 DIAGNOSIS — Z885 Allergy status to narcotic agent status: Secondary | ICD-10-CM | POA: Diagnosis not present

## 2021-03-08 DIAGNOSIS — Z882 Allergy status to sulfonamides status: Secondary | ICD-10-CM | POA: Diagnosis not present

## 2021-03-08 DIAGNOSIS — B9562 Methicillin resistant Staphylococcus aureus infection as the cause of diseases classified elsewhere: Secondary | ICD-10-CM | POA: Diagnosis present

## 2021-03-08 DIAGNOSIS — M439 Deforming dorsopathy, unspecified: Secondary | ICD-10-CM | POA: Diagnosis not present

## 2021-03-08 HISTORY — DX: Severe sepsis with septic shock: R65.21

## 2021-03-08 HISTORY — DX: Sepsis, unspecified organism: A41.9

## 2021-03-08 HISTORY — DX: Gram-negative sepsis, unspecified: A41.50

## 2021-03-08 HISTORY — DX: Urinary tract infection, site not specified: N39.0

## 2021-03-08 LAB — URINALYSIS, COMPLETE (UACMP) WITH MICROSCOPIC
Bilirubin Urine: NEGATIVE
Glucose, UA: NEGATIVE mg/dL
Ketones, ur: 5 mg/dL — AB
Nitrite: POSITIVE — AB
Protein, ur: 100 mg/dL — AB
RBC / HPF: >50 RBC/hpf — ABNORMAL HIGH (ref 0–5)
Specific Gravity, Urine: 1.008 (ref 1.005–1.030)
Squamous Epithelial / HPF: NONE SEEN (ref 0–5)
WBC, UA: >50 WBC/hpf — ABNORMAL HIGH (ref 0–5)
pH: 8 (ref 5.0–8.0)

## 2021-03-08 LAB — GLUCOSE, CAPILLARY
Glucose-Capillary: 90 mg/dL (ref 70–99)
Glucose-Capillary: 79 mg/dL (ref 70–99)

## 2021-03-08 LAB — COMPREHENSIVE METABOLIC PANEL
ALT: 14 U/L (ref 0–44)
AST: 20 U/L (ref 15–41)
Albumin: 2.7 g/dL — ABNORMAL LOW (ref 3.5–5.0)
Alkaline Phosphatase: 63 U/L (ref 38–126)
Anion gap: 11 (ref 5–15)
BUN: 23 mg/dL — ABNORMAL HIGH (ref 6–20)
CO2: 23 mmol/L (ref 22–32)
Calcium: 7.8 mg/dL — ABNORMAL LOW (ref 8.9–10.3)
Chloride: 102 mmol/L (ref 98–111)
Creatinine, Ser: 1.41 mg/dL — ABNORMAL HIGH (ref 0.61–1.24)
GFR, Estimated: 60 mL/min — ABNORMAL LOW (ref >60)
Glucose, Bld: 114 mg/dL — ABNORMAL HIGH (ref 70–99)
Potassium: 3 mmol/L — ABNORMAL LOW (ref 3.5–5.1)
Sodium: 136 mmol/L (ref 135–145)
Total Bilirubin: 1.3 mg/dL — ABNORMAL HIGH (ref 0.3–1.2)
Total Protein: 6.1 g/dL — ABNORMAL LOW (ref 6.5–8.1)

## 2021-03-08 LAB — CBC
HCT: 34.3 % — ABNORMAL LOW (ref 39.0–52.0)
Hemoglobin: 11.1 g/dL — ABNORMAL LOW (ref 13.0–17.0)
MCH: 28.3 pg (ref 26.0–34.0)
MCHC: 32.4 g/dL (ref 30.0–36.0)
MCV: 87.5 fL (ref 80.0–100.0)
Platelets: 241 10*3/uL (ref 150–400)
RBC: 3.92 MIL/uL — ABNORMAL LOW (ref 4.22–5.81)
RDW: 15.4 % (ref 11.5–15.5)
WBC: 18 10*3/uL — ABNORMAL HIGH (ref 4.0–10.5)
nRBC: 0 % (ref 0.0–0.2)

## 2021-03-08 LAB — RESP PANEL BY RT-PCR (FLU A&B, COVID) ARPGX2
Influenza A by PCR: NEGATIVE
Influenza B by PCR: NEGATIVE
SARS Coronavirus 2 by RT PCR: NEGATIVE

## 2021-03-08 LAB — PHOSPHORUS: Phosphorus: 2.7 mg/dL (ref 2.5–4.6)

## 2021-03-08 LAB — LACTIC ACID, PLASMA
Lactic Acid, Venous: 1.8 mmol/L (ref 0.5–1.9)
Lactic Acid, Venous: 2.4 mmol/L (ref 0.5–1.9)

## 2021-03-08 LAB — LIPASE, BLOOD: Lipase: 34 U/L (ref 11–51)

## 2021-03-08 LAB — MRSA PCR SCREENING: MRSA by PCR: NEGATIVE

## 2021-03-08 LAB — MAGNESIUM
Magnesium: 2.6 mg/dL — ABNORMAL HIGH (ref 1.7–2.4)
Magnesium: 1.9 mg/dL (ref 1.7–2.4)

## 2021-03-08 LAB — CBG MONITORING, ED: Glucose-Capillary: 101 mg/dL — ABNORMAL HIGH (ref 70–99)

## 2021-03-08 MED ORDER — SODIUM CHLORIDE 0.9 % IV SOLN
2.0000 g | Freq: Two times a day (BID) | INTRAVENOUS | Status: DC
  Administered 2021-03-08 – 2021-03-10 (×5): 2 g via INTRAVENOUS
  Filled 2021-03-08 (×6): qty 2

## 2021-03-08 MED ORDER — ALBUMIN HUMAN 25 % IV SOLN
25.0000 g | Freq: Four times a day (QID) | INTRAVENOUS | Status: AC
  Administered 2021-03-08 (×2): 25 g via INTRAVENOUS
  Filled 2021-03-08 (×3): qty 100

## 2021-03-08 MED ORDER — CHLORHEXIDINE GLUCONATE CLOTH 2 % EX PADS
6.0000 | MEDICATED_PAD | Freq: Every day | CUTANEOUS | Status: DC
  Administered 2021-03-08 – 2021-03-11 (×3): 6 via TOPICAL

## 2021-03-08 MED ORDER — POTASSIUM CHLORIDE 10 MEQ/100ML IV SOLN
10.0000 meq | INTRAVENOUS | Status: AC
  Administered 2021-03-08 (×4): 10 meq via INTRAVENOUS
  Filled 2021-03-08 (×3): qty 100

## 2021-03-08 MED ORDER — DOCUSATE SODIUM 100 MG PO CAPS
100.0000 mg | ORAL_CAPSULE | Freq: Two times a day (BID) | ORAL | Status: DC | PRN
  Administered 2021-03-08 – 2021-03-10 (×2): 100 mg via ORAL
  Filled 2021-03-08 (×2): qty 1

## 2021-03-08 MED ORDER — NOREPINEPHRINE 4 MG/250ML-% IV SOLN
2.0000 ug/min | INTRAVENOUS | Status: DC
  Administered 2021-03-08: 2 ug/min via INTRAVENOUS
  Filled 2021-03-08: qty 250

## 2021-03-08 MED ORDER — POTASSIUM CHLORIDE CRYS ER 20 MEQ PO TBCR
40.0000 meq | EXTENDED_RELEASE_TABLET | Freq: Once | ORAL | Status: AC
  Administered 2021-03-08: 40 meq via ORAL
  Filled 2021-03-08: qty 2

## 2021-03-08 MED ORDER — HEPARIN SODIUM (PORCINE) 5000 UNIT/ML IJ SOLN
5000.0000 [IU] | Freq: Three times a day (TID) | INTRAMUSCULAR | Status: DC
  Administered 2021-03-08 – 2021-03-11 (×10): 5000 [IU] via SUBCUTANEOUS
  Filled 2021-03-08 (×10): qty 1

## 2021-03-08 MED ORDER — VANCOMYCIN HCL 500 MG/100ML IV SOLN
500.0000 mg | INTRAVENOUS | Status: DC
  Administered 2021-03-09 – 2021-03-10 (×2): 500 mg via INTRAVENOUS
  Filled 2021-03-08 (×3): qty 100

## 2021-03-08 MED ORDER — LACTATED RINGERS IV BOLUS
1000.0000 mL | Freq: Once | INTRAVENOUS | Status: AC
  Administered 2021-03-08: 1000 mL via INTRAVENOUS

## 2021-03-08 MED ORDER — ACETAMINOPHEN 325 MG PO TABS
650.0000 mg | ORAL_TABLET | Freq: Four times a day (QID) | ORAL | Status: DC | PRN
  Administered 2021-03-08 – 2021-03-10 (×3): 650 mg via ORAL
  Filled 2021-03-08 (×3): qty 2

## 2021-03-08 MED ORDER — PANTOPRAZOLE SODIUM 40 MG IV SOLR
40.0000 mg | INTRAVENOUS | Status: DC
  Administered 2021-03-08 – 2021-03-09 (×2): 40 mg via INTRAVENOUS
  Filled 2021-03-08 (×2): qty 40

## 2021-03-08 MED ORDER — ONDANSETRON HCL 4 MG/2ML IJ SOLN
4.0000 mg | Freq: Once | INTRAMUSCULAR | Status: AC
  Administered 2021-03-08: 4 mg via INTRAVENOUS
  Filled 2021-03-08: qty 2

## 2021-03-08 MED ORDER — POLYETHYLENE GLYCOL 3350 17 G PO PACK
17.0000 g | PACK | Freq: Every day | ORAL | Status: DC | PRN
  Administered 2021-03-10: 17 g via ORAL
  Filled 2021-03-08: qty 1

## 2021-03-08 MED ORDER — SODIUM CHLORIDE 0.9 % IV SOLN
250.0000 mL | INTRAVENOUS | Status: DC
  Administered 2021-03-09: 250 mL via INTRAVENOUS

## 2021-03-08 NOTE — H&P (Signed)
NAME:  [name], MRN:  [id number], DOB:  [birth year], LOS: 0 ADMISSION DATE:  03/07/2021, CONSULTATION DATE:  03/08/21 REFERRING MD:  Dr. Leonides Schanz, CHIEF COMPLAINT: Nausea and fever  History of Present Illness:  55 year old male presenting to [facility] ED with complaints of abdominal pain, nausea and subjective fevers for the past several days.  Upon arrival he stated he thinks he has a " kidney infection".  He denies vomiting and reported his urine has appeared dark with some hematuria.  Patient denies chest pain, shortness of breath or cough.  He has a chronic colostomy and urostomy with a history of previous urinary tract infections and bacteremia. ED course:  Patient met SIRS criteria and was worked up per sepsis protocol with initiation of IV fluids and cefepime and vancomycin for empiric coverage.  Patient reported abdominal tenderness per ED documentation on exam CT abdomen pelvis performed due to history of bowel obstruction. Initial vitals: T 99.2 > increasing to 101.8, RR 18, tachycardia at 108 & BP 100/80 (66) with SPO2 97% on room air. Significant labs: Hypokalemia 2.8, Cl: 91, AKI with BUN/Cr: 27/1.65, elevated AG at 18, Mg 2.6, total bilirubin 1.3, Ca: 8.6, lactic acidosis at 2.4 > 1.8, leukocytosis 20.6 with a left shift.  UA + leukocytes & + nitrates, CT abdomen pelvis: Large bilateral staghorn calculi increased from prior study and persistent left-sided hydronephrosis with suspected proximal left ureteral stricture.    TRH initially consulted to admit patient but after 3 L IV fluid bolus patient persistently hypotensive with BP in the 70's-80's/50s.  Peripheral Levophed initiated and PCCM consulted to admit.  Pertinent  Medical History  Spina bifida Chronic colostomy and urostomy Mixed hyperlipidemia Anemia of chronic disease C. difficile colitis Calculus of kidney Significant Hospital Events: Including procedures, antibiotic start and stop dates in addition to other pertinent  events   . 03/08/2021: Admit to ICU with septic shock from urosepsis requiring vasopressor support.  Interim History / Subjective:  Patient alert & pleasant resting on ED stretcher. He confirms that he's been feeling the way he does when he has a UTI. Subjective fevers, some hematuria. He does endorse abdominal pain, which he believes is gas. He denies SOB, CP, nausea/vomiting.  Objective   Blood pressure (!) 80/49, pulse 84, temperature 99.3 F (37.4 C), temperature source Rectal, resp. rate 15, height _0  (1.372 m), weight 55 kg, SpO2 92 %.        Intake/Output Summary (Last 24 hours) at 03/08/2021 0233 Last data filed at 03/08/2021 0226 Gross per 24 hour  Intake 2350 ml  Output --  Net 2350 ml   Filed Weights   03/07/21 2154  Weight: 55 kg    Examination: General: Adult male, critically/chronically ill, lying in bed, NAD HEENT: MM pink/moist, anicteric, glasses in place, atraumatic, neck supple Neuro: A&O x 4, follows commands, PERRL +3, Move BUE/ wheelchair bound at baseline CV: s1s2 RRR, NSR on monitor, no r/m/g Pulm: Regular, non labored on RA, breath sounds clear-BUL & clear/diminished-BLL GI: soft, rounded with colostomy in place, non tender, bs x 4 GU: urostomy in place with clear yellow urine Skin: limited exam- unable to exam sacrum (pt denies any current wounds) no rashes/lesions noted Extremities: warm/dry, pulses + 2 R/P, no edema noted  Labs/imaging that I have personally reviewed  (right click and "Reselect all SmartList Selections" daily)  EKG Interpretation Date:  03/07/2021 EKG Time:  2216 Rate:  106 Rhythm: Sinus tachycardia QRS Axis:  Possible LAD Intervals:  Normal ST/T  Wave abnormalities: artifact present, no STE Narrative Interpretation: Sinus Tachycardia  Na+/ K+: 135/2.8 BUN/Cr.:  27/1.65 Serum CO2/ AG: 26/18  Hgb: 14.2 WBC/ TMAX: 20.6/101.8 Lactic: 2.4 > 1.8  CXR 03/08/2021: Stable without acute cardiopulmonary disease CT abdomen pelvis  03/07/2021: Large bilateral staghorn calculi, increased in size when compared to prior study.  Chronic left-sided hydronephrosis with chronic proximal left ureteral stricture. Resolved Hospital Problem list     Assessment & Plan:  Sepsis with septic shock due to suspected urinary tract infection Lactic: 2.4> 1.8, UA: + Leukocytes (>50 WBC) & + nitrites, + protein + > 50 RBC, +MANY bacteria, + crystals, CXR: Negative, CT: See above Initial interventions/workup included: 2 L of LR, followed by 3rd L NS & Cefepime/ Vancomycin - Supplemental oxygen as needed, to maintain SpO2 > 90% - f/u cultures, trend lactic - Daily CBC - monitor WBC/ fever curve - IV antibiotics: cefepime & vancomycin  - IVF hydration as needed - Continue vasopressors to maintain MAP< 65, norepinephrine - Strict I/O's: alert provider if UOP < 0.5 mL/kg/hr  Acute Kidney Injury in the setting of urosepsis and increasing large bilateral staghorn calculi  Left-sided hydronephrosis appears to be chronic as well as left proximal ureteral stricture. Calculi have increased in size from 06/2020, but are not acutely obstructive. Creatinine levels over the past 2 years have been inconsistent but most recent creatinine from September 2021 Cr: 1.07 Cr on admission: 1.65 - Strict I/O's: alert provider if UOP < 0.5 mL/kg/hr - gentle IVF hydration  - Daily BMP, replace electrolytes PRN - Avoid nephrotoxic agents as able, ensure adequate renal perfusion - consult urology as needed  HTN HLD - home regimen on hold, consider restarting as patient stabilizes  Best practice (right click and "Reselect all SmartList Selections" daily)  Diet:  NPO Pain/Anxiety/Delirium protocol (if indicated): No VAP protocol (if indicated): Not indicated DVT prophylaxis: Subcutaneous Heparin GI prophylaxis: PPI Glucose control:  SSI No Central venous access:  N/A Arterial line:  N/A Foley:  N/A Mobility:  bed rest  PT consulted: N/A Last date of  multidisciplinary goals of care discussion 03/08/21 Code Status:  full code Disposition: ICU  Labs   CBC: Recent Labs  Lab 03/07/21 2239  WBC 20.6*  NEUTROABS 17.4*  HGB 14.2  HCT 44.6  MCV 86.8  PLT 163    Basic Metabolic Panel: Recent Labs  Lab 03/07/21 2159  NA 135  K 2.8*  CL 91*  CO2 26  GLUCOSE 98  BUN 27*  CREATININE 1.65*  CALCIUM 9.4  MG 2.6*   GFR: Estimated Creatinine Clearance: 32 mL/min (A) (by C-G formula based on SCr of 1.65 mg/dL (H)). Recent Labs  Lab 03/07/21 2239 03/08/21 0050  WBC 20.6*  --   LATICACIDVEN 2.4* 1.8    Liver Function Tests: Recent Labs  Lab 03/07/21 2159  AST 27  ALT 19  ALKPHOS 87  BILITOT 1.3*  PROT 8.6*  ALBUMIN 3.7   Recent Labs  Lab 03/07/21 2159  LIPASE 34   No results for input(s): AMMONIA in the last 168 hours.  ABG No results found for: PHART, PCO2ART, PO2ART, HCO3, TCO2, ACIDBASEDEF, O2SAT   Coagulation Profile: No results for input(s): INR, PROTIME in the last 168 hours.  Cardiac Enzymes: No results for input(s): CKTOTAL, CKMB, CKMBINDEX, TROPONINI in the last 168 hours.  HbA1C: Hgb A1c MFr Bld  Date/Time Value Ref Range Status  04/20/2016 09:20 PM 5.6 4.0 - 6.0 % Final    CBG: No results  for input(s): GLUCAP in the last 168 hours.  Review of Systems: Positives in BOLD   Gen: Denies fever, chills, weight change, fatigue, night sweats HEENT: Denies blurred vision, double vision, hearing loss, tinnitus, sinus congestion, rhinorrhea, sore throat, neck stiffness, dysphagia PULM: Denies shortness of breath, cough, sputum production, hemoptysis, wheezing CV: Denies chest pain, edema, orthopnea, paroxysmal nocturnal dyspnea, palpitations GI: Denies abdominal pain, nausea, vomiting, diarrhea, hematochezia, melena, constipation, change in bowel habits GU: Denies dysuria, hematuria, polyuria, oliguria, urethral discharge Endocrine: Denies hot or cold intolerance, polyuria, polyphagia or appetite  change Derm: Denies rash, dry skin, scaling or peeling skin change Heme: Denies easy bruising, bleeding, bleeding gums Neuro: Denies headache, numbness, weakness, slurred speech, loss of memory or consciousness  Past Medical History:  He,  has a past medical history of Decubitus ulcer, Renal disorder, and Spina bifida ([place]).   Surgical History:   Past Surgical History:  Procedure Laterality Date  . ABDOMINAL SURGERY    . BACK SURGERY    . COLON SURGERY    . ESOPHAGOGASTRODUODENOSCOPY (EGD) WITH PROPOFOL N/A 06/03/2016   Procedure: ESOPHAGOGASTRODUODENOSCOPY (EGD) WITH PROPOFOL;  Surgeon: [name], MD;  Location: [facility] ENDOSCOPY;  Service: Endoscopy;  Laterality: N/A;  . ESOPHAGOGASTRODUODENOSCOPY (EGD) WITH PROPOFOL N/A 04/27/2019   Procedure: ESOPHAGOGASTRODUODENOSCOPY (EGD) WITH PROPOFOL;  Surgeon: Jonathon Bellows, MD;  Location: [facility] ENDOSCOPY;  Service: Gastroenterology;  Laterality: N/A;  . ilieostomy    . VENTRICULOPERITONEAL SHUNT    . vp shunt removal       Social History:   reports that he has never smoked. He has never used smokeless tobacco. He reports that he does not drink alcohol and does not use drugs.   Family History:  His family history includes Diabetes Mellitus II in his father.   Allergies Allergies  Allergen Reactions  . Morphine And Related Nausea And Vomiting  . Sulfa Antibiotics Other (See Comments)    Reaction: unknown  . Ivp Dye [Iodinated Diagnostic Agents] Hives  . Latex Rash     Home Medications  Prior to Admission medications   Medication Sig Start Date End Date Taking? Authorizing Provider  Cranberry-Vitamin C-Probiotic (AZO CRANBERRY PO) Take 1 tablet by mouth daily.   Yes [provider]  dicyclomine (BENTYL) 10 MG capsule Take 10 mg by mouth 3 (three) times daily. 02/28/20  Yes [provider]  hydrochlorothiazide (HYDRODIURIL) 25 MG tablet Take 25 mg by mouth daily. 06/19/20  Yes [provider]  Multiple  Vitamins-Minerals (CENTRUM MEN PO) Take 1 tablet by mouth daily.    Yes [provider]  pantoprazole (PROTONIX) 40 MG tablet Take 1 tablet (40 mg total) by mouth daily. 04/29/19 06/27/20 Yes Sainani, Belia Heman, MD  potassium chloride SA (KLOR-CON) 20 MEQ tablet Take 20 mEq by mouth daily. 05/28/20  Yes [provider]  rosuvastatin (CRESTOR) 10 MG tablet Take 10 mg by mouth at bedtime. 10/20/19  Yes [provider]  sucralfate (CARAFATE) 1 g tablet Take 1 g by mouth 3 (three) times daily. 12/14/19  Yes [provider]  traZODone (DESYREL) 50 MG tablet Take 50 mg by mouth at bedtime. 02/15/21  Yes [provider]     Critical care time: 41 minutes       Venetia Night, AGACNP-BC Acute Care Nurse Practitioner [facility]   [phone] / [phone] Please see Amion for pager details.

## 2021-03-08 NOTE — Progress Notes (Signed)
Patient ID: [name], male   DOB: [birth year], 55 y.o.   MRN: [id number]  Sign out from ICU MD  presented with sepsis with possible left gluteal abscess (clinically) --Dr Celine Ahr aware H/o colostomy and urosotmy Known larger bilateral renal stones TRH will pick up from 03/09/21

## 2021-03-08 NOTE — ED Notes (Signed)
Attempted to call report to ICU.  ICU states they will not be able to take pt till day shift, but will be ready first thing in the morning.

## 2021-03-08 NOTE — Consult Note (Signed)
Reason for Consult: Gluteal abscess Referring Physician: Mortimer Fries, MD (PCCM)  [name] is an 55 y.o. male.  HPI: He presented to the emergency department yesterday with complaints of abdominal pain, nausea, and subjective fevers for the past several days.  He reported that he thought he had a "kidney infection.".  No nausea or vomiting.  No fevers or chills.  He said that his urostomy output appeared dark and possibly a little bit bloody.  He has a chronic colostomy and urostomy secondary to paraplegia from spina bifida.  He does have a history of multiple previous urinary tract infections and bacteremia secondary to these.  Due to persistent hypotension in the emergency department, he was admitted to the ICU to receive vasopressor support.  He has subsequently been adequately resuscitated and will be transferring out of the ICU to the hospitalist medicine service.  He was noted to have an abscess near his left buttock and general surgery was consulted for further evaluation.  Past Medical History:  Diagnosis Date  . Decubitus ulcer   . Renal disorder   . Spina bifida ([place])   Nephrolithiasis Permanent colostomy and urostomy  Past Surgical History:  Procedure Laterality Date  . ABDOMINAL SURGERY    . BACK SURGERY    . COLON SURGERY    . ESOPHAGOGASTRODUODENOSCOPY (EGD) WITH PROPOFOL N/A 06/03/2016   Procedure: ESOPHAGOGASTRODUODENOSCOPY (EGD) WITH PROPOFOL;  Surgeon: [name], MD;  Location: [facility] ENDOSCOPY;  Service: Endoscopy;  Laterality: N/A;  . ESOPHAGOGASTRODUODENOSCOPY (EGD) WITH PROPOFOL N/A 04/27/2019   Procedure: ESOPHAGOGASTRODUODENOSCOPY (EGD) WITH PROPOFOL;  Surgeon: Jonathon Bellows, MD;  Location: [facility] ENDOSCOPY;  Service: Gastroenterology;  Laterality: N/A;  . ilieostomy    . VENTRICULOPERITONEAL SHUNT    . vp shunt removal      Family History  Problem Relation Age of Onset  . Diabetes Mellitus II Father     Social History:  reports that he has never smoked. He has  never used smokeless tobacco. He reports that he does not drink alcohol and does not use drugs.  Allergies:  Allergies  Allergen Reactions  . Morphine And Related Nausea And Vomiting  . Sulfa Antibiotics Other (See Comments)    Reaction: unknown  . Ivp Dye [Iodinated Diagnostic Agents] Hives  . Latex Rash    Medications: I have reviewed the patient's current medications.  Results for orders placed or performed during the hospital encounter of 03/07/21 (from the past 48 hour(s))  Comprehensive metabolic panel     Status: Abnormal   Collection Time: 03/07/21  9:59 PM  Result Value Ref Range   Sodium 135 135 - 145 mmol/L   Potassium 2.8 (L) 3.5 - 5.1 mmol/L   Chloride 91 (L) 98 - 111 mmol/L   CO2 26 22 - 32 mmol/L   Glucose, Bld 98 70 - 99 mg/dL    Comment: Glucose reference range applies only to samples taken after fasting for at least 8 hours.   BUN 27 (H) 6 - 20 mg/dL   Creatinine, Ser 1.65 (H) 0.61 - 1.24 mg/dL   Calcium 9.4 8.9 - 10.3 mg/dL   Total Protein 8.6 (H) 6.5 - 8.1 g/dL   Albumin 3.7 3.5 - 5.0 g/dL   AST 27 15 - 41 U/L   ALT 19 0 - 44 U/L   Alkaline Phosphatase 87 38 - 126 U/L   Total Bilirubin 1.3 (H) 0.3 - 1.2 mg/dL   GFR, Estimated 49 (L) >60 mL/min    Comment: (NOTE) Calculated using the CKD-EPI  Creatinine Equation (2021)    Anion gap 18 (H) 5 - 15    Comment: Performed at [facility], [address] [place], [place] [address]  Lipase, blood     Status: None   Collection Time: 03/07/21  9:59 PM  Result Value Ref Range   Lipase 34 11 - 51 U/L    Comment: Performed at [facility], [address] [place], [place] [address]  Magnesium     Status: Abnormal   Collection Time: 03/07/21  9:59 PM  Result Value Ref Range   Magnesium 2.6 (H) 1.7 - 2.4 mg/dL    Comment: Performed at [facility], [address] [place], [place] [address]  Resp Panel by RT-PCR (Flu A&B, Covid) Nasopharyngeal Swab     Status: None   Collection  Time: 03/07/21 10:32 PM   Specimen: Nasopharyngeal Swab; Nasopharyngeal(NP) swabs in vial transport medium  Result Value Ref Range   SARS Coronavirus 2 by RT PCR NEGATIVE NEGATIVE    Comment: (NOTE) SARS-CoV-2 target nucleic acids are NOT DETECTED.  The SARS-CoV-2 RNA is generally detectable in upper respiratory specimens during the acute phase of infection. The lowest concentration of SARS-CoV-2 viral copies this assay can detect is 138 copies/mL. A negative result does not preclude SARS-Cov-2 infection and should not be used as the sole basis for treatment or other patient management decisions. A negative result may occur with  improper specimen collection/handling, submission of specimen other than nasopharyngeal swab, presence of viral mutation(s) within the areas targeted by this assay, and inadequate number of viral copies(<138 copies/mL). A negative result must be combined with clinical observations, patient history, and epidemiological information. The expected result is Negative.  Fact Sheet for Patients:  EntrepreneurPulse.com.au  Fact Sheet for Healthcare Providers:  IncredibleEmployment.be  This test is no t yet approved or cleared by the [place] FDA and  has been authorized for detection and/or diagnosis of SARS-CoV-2 by FDA under an Emergency Use Authorization (EUA). This EUA will remain  in effect (meaning this test can be used) for the duration of the COVID-19 declaration under Section 564(b)(1) of the Act, 21 U.S.C.section 360bbb-3(b)(1), unless the authorization is terminated  or revoked sooner.       Influenza A by PCR NEGATIVE NEGATIVE   Influenza B by PCR NEGATIVE NEGATIVE    Comment: (NOTE) The Xpert Xpress SARS-CoV-2/FLU/RSV plus assay is intended as an aid in the diagnosis of influenza from Nasopharyngeal swab specimens and should not be used as a sole basis for treatment. Nasal washings and aspirates are  unacceptable for Xpert Xpress SARS-CoV-2/FLU/RSV testing.  Fact Sheet for Patients: EntrepreneurPulse.com.au  Fact Sheet for Healthcare Providers: IncredibleEmployment.be  This test is not yet approved or cleared by the [place] FDA and has been authorized for detection and/or diagnosis of SARS-CoV-2 by FDA under an Emergency Use Authorization (EUA). This EUA will remain in effect (meaning this test can be used) for the duration of the COVID-19 declaration under Section 564(b)(1) of the Act, 21 U.S.C. section 360bbb-3(b)(1), unless the authorization is terminated or revoked.  Performed at [facility], [address] [place], [place] [address]   Lactic acid, plasma     Status: Abnormal   Collection Time: 03/07/21 10:39 PM  Result Value Ref Range   Lactic Acid, Venous 2.4 (HH) 0.5 - 1.9 mmol/L    Comment: CRITICAL RESULT CALLED TO, READ BACK BY AND VERIFIED WITH  AUSTIN REEVES 03/08/21 0006 ADL Performed at [facility],  [place], [place] [address]   Urinalysis, Complete w Microscopic Urine, Clean Catch     Status: Abnormal   Collection Time: 03/07/21 10:39 PM  Result Value Ref Range   Color, Urine YELLOW (A) YELLOW   APPearance CLOUDY (A) CLEAR   Specific Gravity, Urine 1.008 1.005 - 1.030   pH 8.0 5.0 - 8.0   Glucose, UA NEGATIVE NEGATIVE mg/dL   Hgb urine dipstick LARGE (A) NEGATIVE   Bilirubin Urine NEGATIVE NEGATIVE   Ketones, ur 5 (A) NEGATIVE mg/dL   Protein, ur 100 (A) NEGATIVE mg/dL   Nitrite POSITIVE (A) NEGATIVE   Leukocytes,Ua LARGE (A) NEGATIVE   RBC / HPF >50 (H) 0 - 5 RBC/hpf   WBC, UA >50 (H) 0 - 5 WBC/hpf   Bacteria, UA MANY (A) NONE SEEN   Squamous Epithelial / LPF NONE SEEN 0 - 5   WBC Clumps PRESENT    Crystals PRESENT (A) NEGATIVE    Comment: Performed at [facility], [address] [place], [place] [address]  CBC with Differential/Platelet     Status: Abnormal    Collection Time: 03/07/21 10:39 PM  Result Value Ref Range   WBC 20.6 (H) 4.0 - 10.5 K/uL   RBC 5.14 4.22 - 5.81 MIL/uL   Hemoglobin 14.2 13.0 - 17.0 g/dL   HCT 44.6 39.0 - 52.0 %   MCV 86.8 80.0 - 100.0 fL   MCH 27.6 26.0 - 34.0 pg   MCHC 31.8 30.0 - 36.0 g/dL   RDW 15.2 11.5 - 15.5 %   Platelets 318 150 - 400 K/uL   nRBC 0.0 0.0 - 0.2 %   Neutrophils Relative % 83 %   Neutro Abs 17.4 (H) 1.7 - 7.7 K/uL   Lymphocytes Relative 6 %   Lymphs Abs 1.2 0.7 - 4.0 K/uL   Monocytes Relative 9 %   Monocytes Absolute 1.8 (H) 0.1 - 1.0 K/uL   Eosinophils Relative 0 %   Eosinophils Absolute 0.0 0.0 - 0.5 K/uL   Basophils Relative 1 %   Basophils Absolute 0.1 0.0 - 0.1 K/uL   Immature Granulocytes 1 %   Abs Immature Granulocytes 0.12 (H) 0.00 - 0.07 K/uL    Comment: Performed at [facility], [address] [place], [place] [address]  Blood culture (single)     Status: None (Preliminary result)   Collection Time: 03/07/21 10:40 PM   Specimen: BLOOD  Result Value Ref Range   Specimen Description BLOOD LEFT ANTECUBITAL    Special Requests      BOTTLES DRAWN AEROBIC AND ANAEROBIC Blood Culture adequate volume   Culture      NO GROWTH < 12 HOURS Performed at [facility], [address] [place],  [address]    Report Status PENDING   Culture, blood (single)     Status: None (Preliminary result)   Collection Time: 03/07/21 11:56 PM   Specimen: BLOOD  Result Value Ref Range   Specimen Description BLOOD LEFT ANTECUBITAL    Special Requests      BOTTLES DRAWN AEROBIC AND ANAEROBIC Blood Culture adequate volume   Culture      NO GROWTH < 12 HOURS Performed at [facility], [address] [place],  [address]    Report Status PENDING   Lactic acid, plasma     Status: None   Collection Time: 03/08/21 12:50 AM  Result Value Ref Range   Lactic Acid, Venous 1.8 0.5 - 1.9 mmol/L  Comment: Performed at [facility], [address] [place], [place] [address]  CBC     Status: Abnormal   Collection Time: 03/08/21  4:29 AM  Result Value Ref Range   WBC 18.0 (H) 4.0 - 10.5 K/uL   RBC 3.92 (L) 4.22 - 5.81 MIL/uL   Hemoglobin 11.1 (L) 13.0 - 17.0 g/dL   HCT 34.3 (L) 39.0 - 52.0 %   MCV 87.5 80.0 - 100.0 fL   MCH 28.3 26.0 - 34.0 pg   MCHC 32.4 30.0 - 36.0 g/dL   RDW 15.4 11.5 - 15.5 %   Platelets 241 150 - 400 K/uL   nRBC 0.0 0.0 - 0.2 %    Comment: Performed at [facility], [address], [place], [place] [address]  Magnesium     Status: None   Collection Time: 03/08/21  4:29 AM  Result Value Ref Range   Magnesium 1.9 1.7 - 2.4 mg/dL    Comment: Performed at [facility], [address] [place], [place] [address]  Phosphorus     Status: None   Collection Time: 03/08/21  4:29 AM  Result Value Ref Range   Phosphorus 2.7 2.5 - 4.6 mg/dL    Comment: Performed at [facility], [address] [place], [place] [address]  Comprehensive metabolic panel     Status: Abnormal   Collection Time: 03/08/21  4:29 AM  Result Value Ref Range   Sodium 136 135 - 145 mmol/L   Potassium 3.0 (L) 3.5 - 5.1 mmol/L   Chloride 102 98 - 111 mmol/L   CO2 23 22 - 32 mmol/L   Glucose, Bld 114 (H) 70 - 99 mg/dL    Comment: Glucose reference range applies only to samples taken after fasting for at least 8 hours.   BUN 23 (H) 6 - 20 mg/dL   Creatinine, Ser 1.41 (H) 0.61 - 1.24 mg/dL   Calcium 7.8 (L) 8.9 - 10.3 mg/dL   Total Protein 6.1 (L) 6.5 - 8.1 g/dL   Albumin 2.7 (L) 3.5 - 5.0 g/dL   AST 20 15 - 41 U/L   ALT 14 0 - 44 U/L   Alkaline Phosphatase 63 38 - 126 U/L   Total Bilirubin 1.3 (H) 0.3 - 1.2 mg/dL   GFR, Estimated 60 (L) >60 mL/min    Comment: (NOTE) Calculated using the CKD-EPI Creatinine Equation (2021)    Anion gap 11 5 - 15    Comment: Performed at [facility], [address] [place], [place] [address]  CBG monitoring, ED     Status: Abnormal   Collection Time: 03/08/21   7:19 AM  Result Value Ref Range   Glucose-Capillary 101 (H) 70 - 99 mg/dL    Comment: Glucose reference range applies only to samples taken after fasting for at least 8 hours.  Glucose, capillary     Status: None   Collection Time: 03/08/21  8:12 AM  Result Value Ref Range   Glucose-Capillary 90 70 - 99 mg/dL    Comment: Glucose reference range applies only to samples taken after fasting for at least 8 hours.  MRSA PCR Screening     Status: None   Collection Time: 03/08/21  8:14 AM   Specimen: Nasal Mucosa; Nasopharyngeal  Result Value Ref Range   MRSA by PCR NEGATIVE NEGATIVE    Comment:        The GeneXpert MRSA Assay (FDA approved for NASAL specimens only), is one component of a  comprehensive MRSA colonization surveillance program. It is not intended to diagnose MRSA infection nor to guide or monitor treatment for MRSA infections. Performed at [facility], [address] [place], [place] [address]   Glucose, capillary     Status: None   Collection Time: 03/08/21 11:31 AM  Result Value Ref Range   Glucose-Capillary 79 70 - 99 mg/dL    Comment: Glucose reference range applies only to samples taken after fasting for at least 8 hours.    CT ABDOMEN PELVIS WO CONTRAST  Result Date: 03/08/2021 CLINICAL DATA:  Fever and nausea. EXAM: CT ABDOMEN AND PELVIS WITHOUT CONTRAST TECHNIQUE: Multidetector CT imaging of the abdomen and pelvis was performed following the standard protocol without IV contrast. COMPARISON:  June 27, 2020 FINDINGS: Lower chest: No acute abnormality. Hepatobiliary: No focal liver abnormality is seen. No gallstones, gallbladder wall thickening, or biliary dilatation. Pancreas: Unremarkable. No pancreatic ductal dilatation or surrounding inflammatory changes. Spleen: Normal in size without focal abnormality. Adrenals/Urinary Tract: Adrenal glands are unremarkable. The kidneys are lobulated in appearance. Multiple parenchymal calcifications are seen  along the periphery of the right kidney. A large renal calculus is seen involving the upper pole calices on the right. This extends into the right renal pelvis and is increased in size when compared to the prior study. Mild right-sided hydronephrosis is noted. A 2.0 cm x 1.9 cm staghorn type calculus is also seen within the lower pole of the left kidney. Large clusters of renal calculi are again seen within dilated calices within the mid left kidney. Persistent left-sided hydronephrosis is seen to the level just beyond the left UPJ. The urinary bladder is empty and subsequently limited in evaluation. There is evidence of prior urinary diversion and subsequent right ileostomy. Stomach/Bowel: Stomach is within normal limits. The appendix is not identified. A moderate amount of stool is seen throughout the large bowel. No evidence of bowel dilatation. Vascular/Lymphatic: No significant vascular findings are present. No enlarged abdominal or pelvic lymph nodes. Reproductive: Prostate is unremarkable. Other: A left lower quadrant ostomy site is seen. This is seen on the prior study. Musculoskeletal: There is chronic dislocation of the left hip with associated dysplasia of the left hip joint and remodeling of the left femoral head. A chronic left hip effusion is seen. 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 IMPRESSION: 1. Large bilateral staghorn calculi, increased in size when compared to the prior study. 2. Persistent left-sided hydronephrosis with suspected proximal left ureteral stricture. 3. Stable postoperative changes with subsequent left lower quadrant ostomy site and right-sided ileostomy. 4. Chronic changes involving the left hip with chronic and congenital deformity is seen throughout the lumbar spine. Electronically Signed   By: Virgina Norfolk M.D.   On: 03/08/2021 00:50   DG Chest Portable 1 View  Result Date: 03/08/2021 CLINICAL DATA:  Nausea and  fever. EXAM: PORTABLE CHEST 1 VIEW COMPARISON:  December 22, 2019 FINDINGS: There is no evidence of acute infiltrate, pleural effusion or pneumothorax. The heart size and mediastinal contours are within normal limits. Stable scoliosis of the thoracolumbar spine is seen with chronic left upper rib deformities noted. IMPRESSION: Stable exam without acute cardiopulmonary disease. Electronically Signed   By: Virgina Norfolk M.D.   On: 03/08/2021 00:52    Review of Systems  All other systems reviewed and are negative. Or as discussed in the history of present illness Of note, he is insensate in his perineal area.  Blood  pressure 92/62, pulse 71, temperature 98.2 F (36.8 C), temperature source Axillary, resp. rate 12, height 4\' 6"  (1.372 m), weight 55 kg, SpO2 100 %. Physical Exam Constitutional:      General: He is not in acute distress. HENT:     Head: Normocephalic and atraumatic.  Eyes:     Comments: Left eye strabismus with corneal clouding.  Cardiovascular:     Rate and Rhythm: Normal rate and regular rhythm.  Pulmonary:     Effort: Pulmonary effort is normal. No respiratory distress.  Abdominal:     Palpations: Abdomen is soft.     Comments: Colostomy and urostomy in place  Musculoskeletal:        General: Deformity present.  Skin:    General: Skin is warm and dry.          Comments: In the left gluteal fold, there is an open abscess which is freely draining purulent material.  The opening is approximately 1 cm.  Additional purulent material was expressed until no additional pus could be drained.  Neurological:     Mental Status: He is alert. Mental status is at baseline.  Psychiatric:        Mood and Affect: Mood normal.        Behavior: Behavior normal.     Assessment/Plan: This is a 55 year old man with paraplegia secondary to spina bifida.  He presented with sepsis secondary to what appears to be a urinary tract infection.  He was incidentally noted to have an abscess in  his left gluteal fold.  He does not have a decubitus ulcer at this site.  The abscess has a large opening and is freely draining.  I was able to express additional purulent material from the underlying tissues.  I do not think any additional surgical intervention is required.  I have recommended that the area be cleaned with warm soapy water daily and as needed and that an absorbent dressing be applied to prevent moisture collection on his skin.  No need for ongoing surgical follow-up at this time.  Fredirick Maudlin 03/08/2021, 12:37 PM

## 2021-03-08 NOTE — Consult Note (Signed)
PHARMACY CONSULT NOTE - FOLLOW UP  Pharmacy Consult for Electrolyte Monitoring and Replacement   Recent Labs: Potassium (mmol/L)  Date Value  03/08/2021 3.0 (L)   Magnesium (mg/dL)  Date Value  03/08/2021 1.9   Calcium (mg/dL)  Date Value  03/08/2021 7.8 (L)   Albumin (g/dL)  Date Value  03/08/2021 2.7 (L)   Phosphorus (mg/dL)  Date Value  03/08/2021 2.7   Sodium (mmol/L)  Date Value  03/08/2021 136     Assessment: 54 yo male with sepsis with possible left gluteal abscess   Pharmacy to monitor/replenish electrolytes per protocol  Goal of Therapy:  Electrolytes wnl's  Plan:  K 3.0 - repleted with 24meq kcl po x 1  Will follow  Lu Duffel ,PharmD Clinical Pharmacist 03/08/2021 11:43 AM

## 2021-03-08 NOTE — Progress Notes (Signed)
Patient refused Blood sugar check. States he is not diabetic. Secured message sent to [facility] asking to DC orders?

## 2021-03-08 NOTE — Progress Notes (Signed)
Pharmacy Antibiotic Note  [name] is a 55 y.o. male admitted on 03/07/2021 with sepsis and UTI.  Pharmacy has been consulted for Cefepime and Vancomycin dosing.  Plan: Ordered Cefepime 2 gm q12h per indication and renal fxn  Vancomycin Pt given loading dose of Vancomycin 1250 mg. Vancomycin 500 mg IV Q 24 hrs.  Goal AUC 400-550. Expected AUC: 478.2 SCr used: 1.65  Pharmacy will continue to follow and adjust abx dosing when warranted.  Height: 4\' 6"  (137.2 cm) Weight: 55 kg (121 lb 4.1 oz) IBW/kg (Calculated) : 36.2  Temp (24hrs), Avg:100.1 F (37.8 C), Min:99.2 F (37.3 C), Max:101.8 F (38.8 C)  Recent Labs  Lab 03/07/21 2159 03/07/21 2239 03/08/21 0050  WBC  --  20.6*  --   CREATININE 1.65*  --   --   LATICACIDVEN  --  2.4* 1.8    Estimated Creatinine Clearance: 32 mL/min (A) (by C-G formula based on SCr of 1.65 mg/dL (H)).    Allergies  Allergen Reactions  . Morphine And Related Nausea And Vomiting  . Sulfa Antibiotics Other (See Comments)    Reaction: unknown  . Ivp Dye [Iodinated Diagnostic Agents] Hives  . Latex Rash    Antimicrobials this admission: 6/04 Cefepime >>  6/05 Vancomcyin >>   Microbiology results: 6/04 BCx: Pending 6/04 UCx: Pending   Thank you for allowing pharmacy to be a part of this patient's care.  [name], PharmD, [facility] 03/08/2021 4:45 AM

## 2021-03-09 LAB — CBC
HCT: 32.2 % — ABNORMAL LOW (ref 39.0–52.0)
Hemoglobin: 10.4 g/dL — ABNORMAL LOW (ref 13.0–17.0)
MCH: 28.2 pg (ref 26.0–34.0)
MCHC: 32.3 g/dL (ref 30.0–36.0)
MCV: 87.3 fL (ref 80.0–100.0)
Platelets: 200 10*3/uL (ref 150–400)
RBC: 3.69 MIL/uL — ABNORMAL LOW (ref 4.22–5.81)
RDW: 15.7 % — ABNORMAL HIGH (ref 11.5–15.5)
WBC: 12.1 10*3/uL — ABNORMAL HIGH (ref 4.0–10.5)
nRBC: 0 % (ref 0.0–0.2)

## 2021-03-09 LAB — URINE CULTURE

## 2021-03-09 LAB — BASIC METABOLIC PANEL
Anion gap: 15 (ref 5–15)
BUN: 18 mg/dL (ref 6–20)
CO2: 19 mmol/L — ABNORMAL LOW (ref 22–32)
Calcium: 8.6 mg/dL — ABNORMAL LOW (ref 8.9–10.3)
Chloride: 107 mmol/L (ref 98–111)
Creatinine, Ser: 1.22 mg/dL (ref 0.61–1.24)
GFR, Estimated: >60 mL/min (ref >60)
Glucose, Bld: 57 mg/dL — ABNORMAL LOW (ref 70–99)
Potassium: 3.2 mmol/L — ABNORMAL LOW (ref 3.5–5.1)
Sodium: 141 mmol/L (ref 135–145)

## 2021-03-09 LAB — GLUCOSE, CAPILLARY
Glucose-Capillary: 70 mg/dL (ref 70–99)
Glucose-Capillary: 83 mg/dL (ref 70–99)
Glucose-Capillary: 87 mg/dL (ref 70–99)
Glucose-Capillary: 106 mg/dL — ABNORMAL HIGH (ref 70–99)

## 2021-03-09 LAB — MAGNESIUM: Magnesium: 2.1 mg/dL (ref 1.7–2.4)

## 2021-03-09 LAB — PHOSPHORUS: Phosphorus: 1.8 mg/dL — ABNORMAL LOW (ref 2.5–4.6)

## 2021-03-09 MED ORDER — LACTATED RINGERS IV BOLUS
500.0000 mL | Freq: Once | INTRAVENOUS | Status: AC
  Administered 2021-03-09: 500 mL via INTRAVENOUS

## 2021-03-09 MED ORDER — ADULT MULTIVITAMIN W/MINERALS CH
1.0000 | ORAL_TABLET | Freq: Every day | ORAL | Status: DC
  Administered 2021-03-10 – 2021-03-11 (×2): 1 via ORAL
  Filled 2021-03-09 (×2): qty 1

## 2021-03-09 MED ORDER — POTASSIUM CHLORIDE 20 MEQ PO PACK
40.0000 meq | PACK | Freq: Once | ORAL | Status: AC
  Administered 2021-03-09: 40 meq via ORAL
  Filled 2021-03-09: qty 2

## 2021-03-09 MED ORDER — PANTOPRAZOLE SODIUM 40 MG PO TBEC
40.0000 mg | DELAYED_RELEASE_TABLET | Freq: Every day | ORAL | Status: DC
  Administered 2021-03-10 – 2021-03-11 (×2): 40 mg via ORAL
  Filled 2021-03-09 (×2): qty 1

## 2021-03-09 MED ORDER — BOOST PLUS PO LIQD
237.0000 mL | Freq: Three times a day (TID) | ORAL | Status: DC
  Filled 2021-03-09: qty 237

## 2021-03-09 MED ORDER — BOOST / RESOURCE BREEZE PO LIQD CUSTOM
1.0000 | Freq: Three times a day (TID) | ORAL | Status: DC
  Administered 2021-03-09 – 2021-03-10 (×2): 1 via ORAL

## 2021-03-09 MED ORDER — ENSURE ENLIVE PO LIQD: 237.0000 mL | Freq: Three times a day (TID) | ORAL | Status: DC

## 2021-03-09 MED ORDER — SIMETHICONE 80 MG PO CHEW
80.0000 mg | CHEWABLE_TABLET | Freq: Four times a day (QID) | ORAL | Status: DC | PRN
  Administered 2021-03-09 – 2021-03-11 (×2): 80 mg via ORAL
  Filled 2021-03-09 (×2): qty 1

## 2021-03-09 MED ORDER — POTASSIUM & SODIUM PHOSPHATES 280-160-250 MG PO PACK
1.0000 | PACK | Freq: Three times a day (TID) | ORAL | Status: AC
  Administered 2021-03-09 (×2): 1 via ORAL
  Filled 2021-03-09 (×2): qty 1

## 2021-03-09 NOTE — Consult Note (Signed)
[facility] for Electrolyte Monitoring and Replacement   Recent Labs: Potassium (mmol/L)  Date Value  03/09/2021 3.2 (L)   Magnesium (mg/dL)  Date Value  03/09/2021 2.1   Calcium (mg/dL)  Date Value  03/09/2021 8.6 (L)   Albumin (g/dL)  Date Value  03/08/2021 2.7 (L)   Phosphorus (mg/dL)  Date Value  03/09/2021 1.8 (L)   Sodium (mmol/L)  Date Value  03/09/2021 141   Assessment: 55 yo male with medical history including Spina bifida, chronic colostomy and urostomy, Hx C diff colitis who is admitted with suspected urosepsis. Also noted to have abscess in left gluteal fold which is freely draining. Pharmacy to monitor/replenish electrolytes per protocol  Goal of Therapy:  Electrolytes within normal limits  Plan:  6/6 AM labs: K 3.2, Co2 19, Scr 1.22, Phos 1.8 --Will give KCl 40 mEq PO x 1 dose --Will give Phos-Nak 2 packets x 1 dose --Re-check electrolytes with AM labs tomorrow  Benita Gutter 03/09/2021 9:15 AM

## 2021-03-09 NOTE — Progress Notes (Signed)
Initial Nutrition Assessment  DOCUMENTATION CODES:   Not applicable  INTERVENTION:   Boost Breeze po TID, each supplement provides 250 kcal and 9 grams of protein  Vital Cuisine TID, each supplement provides 520kcal and 22g of protein.   Magic cup TID with meals, each supplement provides 290 kcal and 9 grams of protein  MVI po daily   Snacks   NUTRITION DIAGNOSIS:   Inadequate oral intake related to acute illness as evidenced by per patient/family report.  GOAL:   Patient will meet greater than or equal to 90% of their needs  MONITOR:   PO intake,Supplement acceptance,Labs,Weight trends,Skin,I & O's  REASON FOR ASSESSMENT:   Consult Assessment of nutrition requirement/status  ASSESSMENT:   55 y.o. male with history of spina bifida, neurogenic bladder, chronic bilateral hydronephrosis, known bilateral nephrolithiasis, hypertension, hyperlipidemia, CKD stage III, AAA, previous urinary tract infections with sepsis, s/p ileostomy and urostomy who is admitted with UTI, sepsis, AKI and gluteal abscess.   Met with [name] in room today. [name] is known to this RD from previous admits. [name] reports good appetite and oral intake at baseline; [name] reports that he typically eats multiple small meals at home. [name] has gained 45lbs since this RD last saw him in 2019. [name] reports that he lives with his sister now, whom is a home health nurse and provides meals for him. [name] continues to drink chocolate Boost at home, at least 2 per day. [name] does not like Ensure but he is willing to try orange Boost Breeze and Vital Cuisine. Per chart, [name] appears weight stable for the past couple of years. [name] eating ~25% of meals in hospital; [name] reports early satiety and gas pain today. RD will add supplements and snacks to help [name] meet his estimated needs. [name] likes chocolate flavors and orange Boost Breeze. Simethicone added as needed by pharmacy.   Medications reviewed and include: heparin, protonix, KCl, cefepime,  vancomycin   Labs reviewed: K 3.2(L), P 1.8(L), Mg 2.1 wnl Wbc- 12.1(H), Hgb 10.4(L), Hct 32.2(L) cbgs- 70, 83 x 24 hrs  NUTRITION - FOCUSED PHYSICAL EXAM:  Flowsheet Row Most Recent Value  Orbital Region No depletion  Upper Arm Region Mild depletion  Thoracic and Lumbar Region No depletion  Buccal Region No depletion  Temple Region No depletion  Clavicle Bone Region No depletion  Clavicle and Acromion Bone Region No depletion  Scapular Bone Region No depletion  Dorsal Hand Mild depletion  Patellar Region Moderate depletion  Anterior Thigh Region Moderate depletion  Posterior Calf Region Moderate depletion  Edema (RD Assessment) Moderate  Hair Reviewed  Eyes Reviewed  Mouth Reviewed  Skin Reviewed  Nails Reviewed     Diet Order:   Diet Order            Diet regular Room service appropriate? Yes; Fluid consistency: Thin  Diet effective now                EDUCATION NEEDS:   Education needs have been addressed  Skin:  Skin Assessment: Reviewed RN Assessment (gluteal fold abscess)  Last BM:  6/6- type 6  Height:   Ht Readings from Last 1 Encounters:  03/09/21 4\' 6"  (1.372 m)    Weight:   Wt Readings from Last 1 Encounters:  03/09/21 54.9 kg    Ideal Body Weight:  40.9 kg  BMI:  Body mass index is 29.18 kg/m.  Estimated Nutritional Needs:   Kcal:  1500-1700kcal/day  Protein:  75-85g/day  Fluid:  1.3-1.5L/day  Manolito Jurewicz MS, RD, LDN Please refer to AMION for RD and/or RD on-call/weekend/after hours pager  

## 2021-03-09 NOTE — Progress Notes (Signed)
Per lab values this am (which obtained through milking his finger per patient) blood sugar 57. Informed patient of the importance of a recheck. Blood sugar 70. Westley Chandler notified and will discuss with Dr. Mortimer Fries. Patient is not symptomatic. Continue to asses

## 2021-03-09 NOTE — Progress Notes (Signed)
PHARMACIST - PHYSICIAN COMMUNICATION  CONCERNING: IV to Oral Route Change Policy  RECOMMENDATION: This patient is receiving pantoprazole by the intravenous route.  Based on criteria approved by the Pharmacy and Therapeutics Committee, the intravenous medication(s) is/are being converted to the equivalent oral dose form(s).   DESCRIPTION: These criteria include:  The patient is eating (either orally or via tube) and/or has been taking other orally administered medications for a least 24 hours  The patient has no evidence of active gastrointestinal bleeding or impaired GI absorption (gastrectomy, short bowel, patient on TNA or NPO).  If you have questions about this conversion, please contact the [place], [facility] 03/09/2021 9:52 AM

## 2021-03-09 NOTE — Progress Notes (Signed)
   03/09/21 1400  Clinical Encounter Type  Visited With Patient  Visit Type Initial;Spiritual support;Social support  Referral From Chaplain  Consult/Referral To [place] (Comment) (loneliness)   Chaplain Gorden Stthomas visited [name] while doing rounds. Pt stated he was very lonely and bored but was glad his sister's boyfriend brought him his tablet, so he could play games. Chaplain offered emotional support. Chaplain ministered with presence, and reflective listening. PT spoke of his various health challenges and is hoping his health will improve. Chaplain will do a follow up tomorrow with PT.

## 2021-03-09 NOTE — Progress Notes (Signed)
PROGRESS NOTE  ICU transfer  [name]  HUT:[id number] DOB: Apr 05, 1966 DOA: 03/07/2021 PCP: Idelle Crouch, MD   Brief Narrative: Taken from prior notes. [name] is a 55 y.o. male with history of spina bifida, neurogenic bladder, chronic bilateral hydronephrosis, known bilateral nephrolithiasis, hypertension, hyperlipidemia, CKD stage III AAA previous urinary tract infections and sepsis who presents to the emergency department with complaints of abdominal pain, nausea, subjective fevers for the past several days.  States he thinks he has a "kidney infection".  He denies any vomiting.  Has a colostomy and urostomy.  States his urine has appeared dark and he has seen some blood as well.  No chest pain, shortness of breath, cough. Patient was febrile and tachycardic on presentation.  Lactic acid 2.4, leukocytosis with left shift, UA with leukocytes and nitrates.  CT abdomen with large bilateral staghorn calculi increased from prior study and persistent left-sided hydronephrosis with suspected proximal left ureteral stricture. Patient later developed persistent hypotension not responding to IV fluid boluses requiring pressor support and was admitted in ICU on Levophed.  Blood pressure improved and now keeping the MAP above 65 without any pressor support. Blood cultures remain negative, urine cultures with multiple organisms.  Also found to have a draining left buttock abscess, surgery was consulted and they are not recommending any procedure.  Abscess cultures growing staph aureus.  Subjective:   Assessment & Plan:   Active Problems:   Sepsis due to gram-negative UTI (HCC)   Septic shock (HCC)  Sepsis secondary to UTI with septic shock.  Improving.  Shock resolved, now able to maintain blood pressure without any pressor support.  Blood cultures negative, wound cultures with staph aureus and urine cultures with multiple growth. -Continue with current regimen of cefepime and  vancomycin. -Continue with supportive care.  AKI with CKD stage IIIa.  Improving. -Monitor renal function -Avoid nephrotoxins  Chronic hydronephrosis with chronic bilateral nephrolithiasis.  Repeat CT abdomen with some concerning of enlarging bilateral staghorn calculi.  Patient was supposed to see [facility] urology as an outpatient after prior hospitalization. -Outpatient urology follow-up for definitive management of his nephrolithiasis to prevent recurrent UTI. -Patient currently has urostomy tube in place.  Hypokalemia.  Magnesium within normal limit -Replete potassium and monitor  Hypophosphatemia.  Phosphorus at 1.8 -Replete phosphorus and monitor  Hypertension. -Home antihypertensives are being held due to softer blood pressure.  Spina bifida ([place]) -s/purostomy and colostomy.   Objective: Vitals:   03/09/21 1000 03/09/21 1100 03/09/21 1200 03/09/21 1300  BP: (!) 97/53 106/66 (!) 98/55 109/61  Pulse: 92 78 74 93  Resp: 19 18 16  (!) 23  Temp:      TempSrc:      SpO2: 100% 100% 97% 100%  Weight:      Height:        Intake/Output Summary (Last 24 hours) at 03/09/2021 1356 Last data filed at 03/09/2021 1300 Gross per 24 hour  Intake 1726.94 ml  Output 1205 ml  Net 521.94 ml   Filed Weights   03/07/21 2154 03/09/21 0500  Weight: 55 kg 54.9 kg    Examination:  General exam: Appears calm and comfortable  Respiratory system: Clear to auscultation. Respiratory effort normal. Cardiovascular system: S1 & S2 heard, RRR.  Gastrointestinal system: Soft, nontender, nondistended, bowel sounds positive, colostomy and urostomy tube in place. Central nervous system: Alert and oriented.  Paraplegic with hypoplastic lower extremities. Extremities: No edema, no cyanosis, pulses intact and symmetrical. Psychiatry: Judgement and insight appear normal. Mood &  affect appropriate.    DVT prophylaxis: Heparin Code Status: Full Family Communication: Discussed with patient, he will  discuss with his sister after her work. Disposition Plan:  Status is: Inpatient  Remains inpatient appropriate because:Inpatient level of care appropriate due to severity of illness   Dispo: The patient is from: Home              Anticipated d/c is to: Home              Patient currently is not medically stable to d/c.   Difficult to place patient No                Level of care: Med-Surg  All the records are reviewed and case discussed with Care Management/Social Worker. Management plans discussed with the patient, nursing and they are in agreement.  Consultants:   PCCM  Procedures:  Antimicrobials:  Cefepime Vancomycin  Data Reviewed: I have personally reviewed following labs and imaging studies  CBC: Recent Labs  Lab 03/07/21 2239 03/08/21 0429 03/09/21 0635  WBC 20.6* 18.0* 12.1*  NEUTROABS 17.4*  --   --   HGB 14.2 11.1* 10.4*  HCT 44.6 34.3* 32.2*  MCV 86.8 87.5 87.3  PLT 318 241 431   Basic Metabolic Panel: Recent Labs  Lab 03/07/21 2159 03/08/21 0429 03/09/21 0635  NA 135 136 141  K 2.8* 3.0* 3.2*  CL 91* 102 107  CO2 26 23 19*  GLUCOSE 98 114* 57*  BUN 27* 23* 18  CREATININE 1.65* 1.41* 1.22  CALCIUM 9.4 7.8* 8.6*  MG 2.6* 1.9 2.1  PHOS  --  2.7 1.8*   GFR: Estimated Creatinine Clearance: 43.3 mL/min (by C-G formula based on SCr of 1.22 mg/dL). Liver Function Tests: Recent Labs  Lab 03/07/21 2159 03/08/21 0429  AST 27 20  ALT 19 14  ALKPHOS 87 63  BILITOT 1.3* 1.3*  PROT 8.6* 6.1*  ALBUMIN 3.7 2.7*   Recent Labs  Lab 03/07/21 2159  LIPASE 34   No results for input(s): AMMONIA in the last 168 hours. Coagulation Profile: No results for input(s): INR, PROTIME in the last 168 hours. Cardiac Enzymes: No results for input(s): CKTOTAL, CKMB, CKMBINDEX, TROPONINI in the last 168 hours. BNP (last 3 results) No results for input(s): PROBNP in the last 8760 hours. HbA1C: No results for input(s): HGBA1C in the last 72  hours. CBG: Recent Labs  Lab 03/08/21 0719 03/08/21 0812 03/08/21 1131 03/09/21 0908 03/09/21 1123  GLUCAP 101* 90 79 70 83   Lipid Profile: No results for input(s): CHOL, HDL, LDLCALC, TRIG, CHOLHDL, LDLDIRECT in the last 72 hours. Thyroid Function Tests: No results for input(s): TSH, T4TOTAL, FREET4, T3FREE, THYROIDAB in the last 72 hours. Anemia Panel: No results for input(s): VITAMINB12, FOLATE, FERRITIN, TIBC, IRON, RETICCTPCT in the last 72 hours. Sepsis Labs: Recent Labs  Lab 03/07/21 2239 03/08/21 0050  LATICACIDVEN 2.4* 1.8    Recent Results (from the past 240 hour(s))  Resp Panel by RT-PCR (Flu A&B, Covid) Nasopharyngeal Swab     Status: None   Collection Time: 03/07/21 10:32 PM   Specimen: Nasopharyngeal Swab; Nasopharyngeal(NP) swabs in vial transport medium  Result Value Ref Range Status   SARS Coronavirus 2 by RT PCR NEGATIVE NEGATIVE Final    Comment: (NOTE) SARS-CoV-2 target nucleic acids are NOT DETECTED.  The SARS-CoV-2 RNA is generally detectable in upper respiratory specimens during the acute phase of infection. The lowest concentration of SARS-CoV-2 viral copies this assay can detect  is 138 copies/mL. A negative result does not preclude SARS-Cov-2 infection and should not be used as the sole basis for treatment or other patient management decisions. A negative result may occur with  improper specimen collection/handling, submission of specimen other than nasopharyngeal swab, presence of viral mutation(s) within the areas targeted by this assay, and inadequate number of viral copies(<138 copies/mL). A negative result must be combined with clinical observations, patient history, and epidemiological information. The expected result is Negative.  Fact Sheet for Patients:  EntrepreneurPulse.com.au  Fact Sheet for Healthcare Providers:  IncredibleEmployment.be  This test is no t yet approved or cleared by the  [place] FDA and  has been authorized for detection and/or diagnosis of SARS-CoV-2 by FDA under an Emergency Use Authorization (EUA). This EUA will remain  in effect (meaning this test can be used) for the duration of the COVID-19 declaration under Section 564(b)(1) of the Act, 21 U.S.C.section 360bbb-3(b)(1), unless the authorization is terminated  or revoked sooner.       Influenza A by PCR NEGATIVE NEGATIVE Final   Influenza B by PCR NEGATIVE NEGATIVE Final    Comment: (NOTE) The Xpert Xpress SARS-CoV-2/FLU/RSV plus assay is intended as an aid in the diagnosis of influenza from Nasopharyngeal swab specimens and should not be used as a sole basis for treatment. Nasal washings and aspirates are unacceptable for Xpert Xpress SARS-CoV-2/FLU/RSV testing.  Fact Sheet for Patients: EntrepreneurPulse.com.au  Fact Sheet for Healthcare Providers: IncredibleEmployment.be  This test is not yet approved or cleared by the [place] FDA and has been authorized for detection and/or diagnosis of SARS-CoV-2 by FDA under an Emergency Use Authorization (EUA). This EUA will remain in effect (meaning this test can be used) for the duration of the COVID-19 declaration under Section 564(b)(1) of the Act, 21 U.S.C. section 360bbb-3(b)(1), unless the authorization is terminated or revoked.  Performed at [facility], [address], [place], [place] [address]   Urine culture     Status: Abnormal   Collection Time: 03/07/21 10:39 PM   Specimen: Urine, Random  Result Value Ref Range Status   Specimen Description   Final    URINE, RANDOM Performed at [facility], [address] [place], [place] [address]    Special Requests   Final    NONE Performed at [facility], [address] [place], [place] [address]    Culture MULTIPLE SPECIES PRESENT, SUGGEST RECOLLECTION (A)  Final   Report Status 03/09/2021 FINAL  Final   Blood culture (single)     Status: None (Preliminary result)   Collection Time: 03/07/21 10:40 PM   Specimen: BLOOD  Result Value Ref Range Status   Specimen Description BLOOD LEFT ANTECUBITAL  Final   Special Requests   Final    BOTTLES DRAWN AEROBIC AND ANAEROBIC Blood Culture adequate volume   Culture   Final    NO GROWTH 2 DAYS Performed at [facility], [address], [place], [place] [address]    Report Status PENDING  Incomplete  Culture, blood (single)     Status: None (Preliminary result)   Collection Time: 03/07/21 11:56 PM   Specimen: BLOOD  Result Value Ref Range Status   Specimen Description BLOOD LEFT ANTECUBITAL  Final   Special Requests   Final    BOTTLES DRAWN AEROBIC AND ANAEROBIC Blood Culture adequate volume   Culture   Final    NO GROWTH 1 DAY Performed at [facility], [place], [place]  [address]    Report Status PENDING  Incomplete  MRSA PCR Screening     Status: None   Collection Time: 03/08/21  8:14 AM   Specimen: Nasal Mucosa; Nasopharyngeal  Result Value Ref Range Status   MRSA by PCR NEGATIVE NEGATIVE Final    Comment:        The GeneXpert MRSA Assay (FDA approved for NASAL specimens only), is one component of a comprehensive MRSA colonization surveillance program. It is not intended to diagnose MRSA infection nor to guide or monitor treatment for MRSA infections. Performed at [facility], [place], [place] [address]   Aerobic Culture w Gram Stain (superficial specimen)     Status: None (Preliminary result)   Collection Time: 03/08/21  9:38 AM   Specimen: Wound  Result Value Ref Range Status   Specimen Description   Final    WOUND Performed at [facility], [address] [place], [place] [address]    Special Requests   Final    SKIN OTHER Performed at [facility], [address] [place], [place] [address]    Gram Stain   Final    RARE WBC PRESENT,  PREDOMINANTLY PMN RARE GRAM POSITIVE COCCI    Culture   Final    ABUNDANT STAPHYLOCOCCUS AUREUS SUSCEPTIBILITIES TO FOLLOW Performed at [facility], [place], [place], [place] [address]    Report Status PENDING  Incomplete     Radiology Studies: CT ABDOMEN PELVIS WO CONTRAST  Result Date: 03/08/2021 CLINICAL DATA:  Fever and nausea. EXAM: CT ABDOMEN AND PELVIS WITHOUT CONTRAST TECHNIQUE: Multidetector CT imaging of the abdomen and pelvis was performed following the standard protocol without IV contrast. COMPARISON:  June 27, 2020 FINDINGS: Lower chest: No acute abnormality. Hepatobiliary: No focal liver abnormality is seen. No gallstones, gallbladder wall thickening, or biliary dilatation. Pancreas: Unremarkable. No pancreatic ductal dilatation or surrounding inflammatory changes. Spleen: Normal in size without focal abnormality. Adrenals/Urinary Tract: Adrenal glands are unremarkable. The kidneys are lobulated in appearance. Multiple parenchymal calcifications are seen along the periphery of the right kidney. A large renal calculus is seen involving the upper pole calices on the right. This extends into the right renal pelvis and is increased in size when compared to the prior study. Mild right-sided hydronephrosis is noted. A 2.0 cm x 1.9 cm staghorn type calculus is also seen within the lower pole of the left kidney. Large clusters of renal calculi are again seen within dilated calices within the mid left kidney. Persistent left-sided hydronephrosis is seen to the level just beyond the left UPJ. The urinary bladder is empty and subsequently limited in evaluation. There is evidence of prior urinary diversion and subsequent right ileostomy. Stomach/Bowel: Stomach is within normal limits. The appendix is not identified. A moderate amount of stool is seen throughout the large bowel. No evidence of bowel dilatation. Vascular/Lymphatic: No significant vascular findings are present. No  enlarged abdominal or pelvic lymph nodes. Reproductive: Prostate is unremarkable. Other: A left lower quadrant ostomy site is seen. This is seen on the prior study. Musculoskeletal: There is chronic dislocation of the left hip with associated dysplasia of the left hip joint and remodeling of the left femoral head. A chronic left hip effusion is seen. 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 IMPRESSION: 1. Large bilateral staghorn calculi, increased in size when compared to the prior study. 2. Persistent left-sided hydronephrosis with suspected proximal left  ureteral stricture. 3. Stable postoperative changes with subsequent left lower quadrant ostomy site and right-sided ileostomy. 4. Chronic changes involving the left hip with chronic and congenital deformity is seen throughout the lumbar spine. Electronically Signed   By: Virgina Norfolk M.D.   On: 03/08/2021 00:50   DG Chest Portable 1 View  Result Date: 03/08/2021 CLINICAL DATA:  Nausea and fever. EXAM: PORTABLE CHEST 1 VIEW COMPARISON:  December 22, 2019 FINDINGS: There is no evidence of acute infiltrate, pleural effusion or pneumothorax. The heart size and mediastinal contours are within normal limits. Stable scoliosis of the thoracolumbar spine is seen with chronic left upper rib deformities noted. IMPRESSION: Stable exam without acute cardiopulmonary disease. Electronically Signed   By: Virgina Norfolk M.D.   On: 03/08/2021 00:52    Scheduled Meds: . Chlorhexidine Gluconate Cloth  6 each Topical Q0600  . feeding supplement  237 mL Oral TID BM  . heparin  5,000 Units Subcutaneous Q8H  . [START ON 03/10/2021] pantoprazole  40 mg Oral Daily  . potassium & sodium phosphates  1 packet Oral TID WC & HS   Continuous Infusions: . sodium chloride 250 mL (03/09/21 0049)  . ceFEPime (MAXIPIME) IV 2 g (03/09/21 0941)  . vancomycin 500 mg (03/09/21 0050)     LOS: 1 day   Time spent: 45  minutes. More than 50% of the time was spent in counseling/coordination of care  Lorella Nimrod, MD [facility]  If 7PM-7AM, please contact night-coverage [email or web address]  03/09/2021, 1:56 PM   This record has been created using Systems analyst. Errors have been sought and corrected,but may not always be located. Such creation errors do not reflect on the standard of care.

## 2021-03-10 LAB — AEROBIC CULTURE W GRAM STAIN (SUPERFICIAL SPECIMEN)

## 2021-03-10 LAB — BASIC METABOLIC PANEL
Anion gap: 12 (ref 5–15)
BUN: 20 mg/dL (ref 6–20)
CO2: 22 mmol/L (ref 22–32)
Calcium: 9 mg/dL (ref 8.9–10.3)
Chloride: 105 mmol/L (ref 98–111)
Creatinine, Ser: 1.34 mg/dL — ABNORMAL HIGH (ref 0.61–1.24)
GFR, Estimated: >60 mL/min (ref >60)
Glucose, Bld: 101 mg/dL — ABNORMAL HIGH (ref 70–99)
Potassium: 2.8 mmol/L — ABNORMAL LOW (ref 3.5–5.1)
Sodium: 139 mmol/L (ref 135–145)

## 2021-03-10 LAB — CBC
HCT: 38.8 % — ABNORMAL LOW (ref 39.0–52.0)
Hemoglobin: 12.3 g/dL — ABNORMAL LOW (ref 13.0–17.0)
MCH: 27.8 pg (ref 26.0–34.0)
MCHC: 31.7 g/dL (ref 30.0–36.0)
MCV: 87.8 fL (ref 80.0–100.0)
Platelets: 287 10*3/uL (ref 150–400)
RBC: 4.42 MIL/uL (ref 4.22–5.81)
RDW: 15.9 % — ABNORMAL HIGH (ref 11.5–15.5)
WBC: 10.3 10*3/uL (ref 4.0–10.5)
nRBC: 0 % (ref 0.0–0.2)

## 2021-03-10 LAB — GLUCOSE, CAPILLARY
Glucose-Capillary: 102 mg/dL — ABNORMAL HIGH (ref 70–99)
Glucose-Capillary: 123 mg/dL — ABNORMAL HIGH (ref 70–99)
Glucose-Capillary: 137 mg/dL — ABNORMAL HIGH (ref 70–99)
Glucose-Capillary: 86 mg/dL (ref 70–99)
Glucose-Capillary: 89 mg/dL (ref 70–99)

## 2021-03-10 LAB — PHOSPHORUS: Phosphorus: 2.1 mg/dL — ABNORMAL LOW (ref 2.5–4.6)

## 2021-03-10 MED ORDER — POTASSIUM CHLORIDE CRYS ER 20 MEQ PO TBCR
40.0000 meq | EXTENDED_RELEASE_TABLET | ORAL | Status: AC
  Administered 2021-03-10 (×2): 40 meq via ORAL
  Filled 2021-03-10 (×2): qty 2

## 2021-03-10 MED ORDER — K PHOS MONO-SOD PHOS DI & MONO 155-852-130 MG PO TABS
500.0000 mg | ORAL_TABLET | Freq: Two times a day (BID) | ORAL | Status: AC
  Administered 2021-03-10 (×2): 500 mg via ORAL
  Filled 2021-03-10 (×2): qty 2

## 2021-03-10 MED ORDER — DOXYCYCLINE HYCLATE 100 MG PO TABS
100.0000 mg | ORAL_TABLET | Freq: Two times a day (BID) | ORAL | Status: DC
  Administered 2021-03-10 – 2021-03-11 (×2): 100 mg via ORAL
  Filled 2021-03-10 (×2): qty 1

## 2021-03-10 MED ORDER — POTASSIUM PHOSPHATES 15 MMOLE/5ML IV SOLN
15.0000 mmol | Freq: Once | INTRAVENOUS | Status: DC
  Filled 2021-03-10: qty 5

## 2021-03-10 NOTE — Consult Note (Signed)
[facility] for Electrolyte Monitoring and Replacement   Recent Labs: Potassium (mmol/L)  Date Value  03/10/2021 2.8 (L)   Magnesium (mg/dL)  Date Value  03/09/2021 2.1   Calcium (mg/dL)  Date Value  03/10/2021 9.0   Albumin (g/dL)  Date Value  03/08/2021 2.7 (L)   Phosphorus (mg/dL)  Date Value  03/10/2021 2.1 (L)   Sodium (mmol/L)  Date Value  03/10/2021 139   Assessment: 55 yo male with medical history including Spina bifida, chronic colostomy and urostomy, Hx C diff colitis who is admitted with suspected urosepsis. Also noted to have abscess in left gluteal fold which is freely draining. Pharmacy to monitor/replenish electrolytes per protocol  Goal of Therapy:  Electrolytes within normal limits  Plan:  6/7 @ 0402:   K = 2.8,  Phos = 2.1  Potassium phosphate 15 mmol IV X 1.  Will recheck electrolytes on 6/7 @ 1400.    Najat Olazabal D 03/10/2021 5:58 AM

## 2021-03-10 NOTE — Consult Note (Signed)
[facility] for Electrolyte Monitoring and Replacement   Recent Labs: Potassium (mmol/L)  Date Value  03/10/2021 2.8 (L)   Magnesium (mg/dL)  Date Value  03/09/2021 2.1   Calcium (mg/dL)  Date Value  03/10/2021 9.0   Albumin (g/dL)  Date Value  03/08/2021 2.7 (L)   Phosphorus (mg/dL)  Date Value  03/10/2021 2.1 (L)   Sodium (mmol/L)  Date Value  03/10/2021 139   Assessment: 55 yo male with medical history including Spina bifida, chronic colostomy and urostomy, Hx C diff colitis who is admitted with suspected urosepsis. Also noted to have abscess in left gluteal fold which is freely draining. Pharmacy to monitor/replenish electrolytes per protocol  Goal of Therapy:  Electrolytes within normal limits  Plan:  --Potassium 40 mEq PO x 2 doses --K Phos Neutral 500 mg PO x 2 doses (PO per patient's request) --Re-check electrolytes with AM labs tomorrow  Tawnya Crook, PharmD 03/10/2021 2:19 PM

## 2021-03-10 NOTE — Progress Notes (Signed)
   03/10/21 0930  Clinical Encounter Type  Visited With Patient  Visit Type Initial;Spiritual support;Social support  Referral From Chaplain  Consult/Referral To [place] did a follow-up visit with [name]. [name] said that he was in a better place today, because he received news that he would possibly be going home soon or going to a step-down unit. PT spoke of things he likes to do for fun, and foods he loves to eat. PT also spoke of his childhood, and the relational dynamic between him and his mom. PT does have some family support; his sister. He really enjoys talking to the chaplain and medical staff.  Chaplain ministered with intentional presence and reflective listening.

## 2021-03-10 NOTE — Progress Notes (Signed)
Patient alert and oriented. Room air. Urostomy and colostomy intact. Patient on regular diet. Abscess dressing changed and cleaned. Potassium and phosphorus replaced. Patient tx to floor. Report given to [facility].

## 2021-03-10 NOTE — Progress Notes (Addendum)
PROGRESS NOTE  ICU transfer  [name]  YYT:[id number] DOB: Apr 20, 1966 DOA: 03/07/2021 PCP: Chase Crouch, MD   Brief Narrative: Taken from prior notes. [name] is a 55 y.o. male with history of spina bifida, neurogenic bladder, chronic bilateral hydronephrosis, known bilateral nephrolithiasis, hypertension, hyperlipidemia, CKD stage III AAA previous urinary tract infections and sepsis who presents to the emergency department with complaints of abdominal pain, nausea, subjective fevers for the past several days.  States he thinks he has a "kidney infection".  He denies any vomiting.  Has a colostomy and urostomy.  States his urine has appeared dark and he has seen some blood as well.  No chest pain, shortness of breath, cough. Patient was febrile and tachycardic on presentation.  Lactic acid 2.4, leukocytosis with left shift, UA with leukocytes and nitrates.  CT abdomen with large bilateral staghorn calculi increased from prior study and persistent left-sided hydronephrosis with suspected proximal left ureteral stricture. Patient later developed persistent hypotension not responding to IV fluid boluses requiring pressor support and was admitted in ICU on Levophed.  Blood pressure improved and now keeping the MAP above 65 without any pressor support. Blood cultures remain negative, urine cultures with multiple organisms.  Also found to have a draining left buttock abscess, surgery was consulted and they are not recommending any procedure.  Abscess cultures growing MRSA Antibiotics were switched to doxycycline today.  Subjective: Clinically seems improving, no new complaint.  We discussed about bilateral large kidney stones and hydronephrosis and importance see a urologist.  Per patient he has not seen a urologist in years but want to continue with his on prior urologist at [facility].  Per patient his sister will take care of that. Unable to reach sister on phone.  Assessment &  Plan:   Active Problems:   Acute sepsis ([place])   Acute UTI   AKI (acute kidney injury) ([place])   Sepsis due to gram-negative UTI ([place])   Septic shock ([facility])  Sepsis secondary to UTI with septic shock.  Improving.  Shock resolved, now able to maintain blood pressure without any pressor support.  Blood cultures negative, wound cultures with staph aureus and urine cultures with multiple growth. -Antibiotics were switched with doxycycline today -Continue with supportive care.  Buttock abscess.  Surgery was consulted initially but there is no need for any incision and drainage as it was self draining.  Wound cultures growing MRSA. -Continue with vancomycin  AKI with CKD stage IIIa.  Improving. -Monitor renal function -Avoid nephrotoxins  Chronic hydronephrosis with chronic bilateral nephrolithiasis.  Repeat CT abdomen with some concerning of enlarging bilateral staghorn calculi.  Patient was supposed to see [facility] urology as an outpatient after prior hospitalization. -Outpatient urology follow-up for definitive management of his nephrolithiasis to prevent recurrent UTI. -Patient currently has urostomy tube in place.  Hypokalemia.  Magnesium within normal limit -Replete potassium and monitor  Hypophosphatemia.  Phosphorus at 2.1 -Replete phosphorus and monitor  Hypertension. -Home antihypertensives are being held due to softer blood pressure initially, currently within goal. -Keep holding home antihypertensives-can resume if needed.  Spina bifida ([place]) -s/purostomy and colostomy.   Objective: Vitals:   03/10/21 0930 03/10/21 1000 03/10/21 1030 03/10/21 1100  BP:  112/71  115/65  Pulse: 74 81 76 79  Resp: 18 19 19 14   Temp:      TempSrc:      SpO2: 98% 97% 97% 99%  Weight:      Height:        Intake/Output  Summary (Last 24 hours) at 03/10/2021 1316 Last data filed at 03/10/2021 0946 Gross per 24 hour  Intake 1118.02 ml  Output 1150 ml  Net -31.98 ml   Filed Weights    03/07/21 2154 03/09/21 0500 03/10/21 0500  Weight: 55 kg 54.9 kg 55.1 kg    Examination:  General.  In no acute distress. Pulmonary.  Lungs clear bilaterally, normal respiratory effort. CV.  Regular rate and rhythm, no JVD, rub or murmur. Abdomen.  Soft, nontender, nondistended, BS positive. CNS.  Alert and oriented x3.  No focal neurologic deficit. Extremities.  Hypoplastic lower extremities, pulses intact and symmetrical. Psychiatry.  Judgment and insight appears normal.  DVT prophylaxis: Heparin Code Status: Full Family Communication: Discussed with patient, unable to reach sister. Disposition Plan:  Status is: Inpatient  Remains inpatient appropriate because:Inpatient level of care appropriate due to severity of illness   Dispo: The patient is from: Home              Anticipated d/c is to: Home              Patient currently is not medically stable to d/c.   Difficult to place patient No                Level of care: Med-Surg  All the records are reviewed and case discussed with Care Management/Social Worker. Management plans discussed with the patient, nursing and they are in agreement.  Consultants:   PCCM  Procedures:  Antimicrobials:  Doxycycline  Data Reviewed: I have personally reviewed following labs and imaging studies  CBC: Recent Labs  Lab 03/07/21 2239 03/08/21 0429 03/09/21 0635 03/10/21 0402  WBC 20.6* 18.0* 12.1* 10.3  NEUTROABS 17.4*  --   --   --   HGB 14.2 11.1* 10.4* 12.3*  HCT 44.6 34.3* 32.2* 38.8*  MCV 86.8 87.5 87.3 87.8  PLT 318 241 200 182   Basic Metabolic Panel: Recent Labs  Lab 03/07/21 2159 03/08/21 0429 03/09/21 0635 03/10/21 0402  NA 135 136 141 139  K 2.8* 3.0* 3.2* 2.8*  CL 91* 102 107 105  CO2 26 23 19* 22  GLUCOSE 98 114* 57* 101*  BUN 27* 23* 18 20  CREATININE 1.65* 1.41* 1.22 1.34*  CALCIUM 9.4 7.8* 8.6* 9.0  MG 2.6* 1.9 2.1  --   PHOS  --  2.7 1.8* 2.1*   GFR: Estimated Creatinine Clearance: 39.5  mL/min (A) (by C-G formula based on SCr of 1.34 mg/dL (H)). Liver Function Tests: Recent Labs  Lab 03/07/21 2159 03/08/21 0429  AST 27 20  ALT 19 14  ALKPHOS 87 63  BILITOT 1.3* 1.3*  PROT 8.6* 6.1*  ALBUMIN 3.7 2.7*   Recent Labs  Lab 03/07/21 2159  LIPASE 34   No results for input(s): AMMONIA in the last 168 hours. Coagulation Profile: No results for input(s): INR, PROTIME in the last 168 hours. Cardiac Enzymes: No results for input(s): CKTOTAL, CKMB, CKMBINDEX, TROPONINI in the last 168 hours. BNP (last 3 results) No results for input(s): PROBNP in the last 8760 hours. HbA1C: No results for input(s): HGBA1C in the last 72 hours. CBG: Recent Labs  Lab 03/09/21 2006 03/10/21 0012 03/10/21 0348 03/10/21 0807 03/10/21 1112  GLUCAP 106* 123* 102* 86 89   Lipid Profile: No results for input(s): CHOL, HDL, LDLCALC, TRIG, CHOLHDL, LDLDIRECT in the last 72 hours. Thyroid Function Tests: No results for input(s): TSH, T4TOTAL, FREET4, T3FREE, THYROIDAB in the last 72 hours. Anemia Panel: No  results for input(s): VITAMINB12, FOLATE, FERRITIN, TIBC, IRON, RETICCTPCT in the last 72 hours. Sepsis Labs: Recent Labs  Lab 03/07/21 2239 03/08/21 0050  LATICACIDVEN 2.4* 1.8    Recent Results (from the past 240 hour(s))  Resp Panel by RT-PCR (Flu A&B, Covid) Nasopharyngeal Swab     Status: None   Collection Time: 03/07/21 10:32 PM   Specimen: Nasopharyngeal Swab; Nasopharyngeal(NP) swabs in vial transport medium  Result Value Ref Range Status   SARS Coronavirus 2 by RT PCR NEGATIVE NEGATIVE Final    Comment: (NOTE) SARS-CoV-2 target nucleic acids are NOT DETECTED.  The SARS-CoV-2 RNA is generally detectable in upper respiratory specimens during the acute phase of infection. The lowest concentration of SARS-CoV-2 viral copies this assay can detect is 138 copies/mL. A negative result does not preclude SARS-Cov-2 infection and should not be used as the sole basis for  treatment or other patient management decisions. A negative result may occur with  improper specimen collection/handling, submission of specimen other than nasopharyngeal swab, presence of viral mutation(s) within the areas targeted by this assay, and inadequate number of viral copies(<138 copies/mL). A negative result must be combined with clinical observations, patient history, and epidemiological information. The expected result is Negative.  Fact Sheet for Patients:  EntrepreneurPulse.com.au  Fact Sheet for Healthcare Providers:  IncredibleEmployment.be  This test is no t yet approved or cleared by the [place] FDA and  has been authorized for detection and/or diagnosis of SARS-CoV-2 by FDA under an Emergency Use Authorization (EUA). This EUA will remain  in effect (meaning this test can be used) for the duration of the COVID-19 declaration under Section 564(b)(1) of the Act, 21 U.S.C.section 360bbb-3(b)(1), unless the authorization is terminated  or revoked sooner.       Influenza A by PCR NEGATIVE NEGATIVE Final   Influenza B by PCR NEGATIVE NEGATIVE Final    Comment: (NOTE) The Xpert Xpress SARS-CoV-2/FLU/RSV plus assay is intended as an aid in the diagnosis of influenza from Nasopharyngeal swab specimens and should not be used as a sole basis for treatment. Nasal washings and aspirates are unacceptable for Xpert Xpress SARS-CoV-2/FLU/RSV testing.  Fact Sheet for Patients: EntrepreneurPulse.com.au  Fact Sheet for Healthcare Providers: IncredibleEmployment.be  This test is not yet approved or cleared by the [place] FDA and has been authorized for detection and/or diagnosis of SARS-CoV-2 by FDA under an Emergency Use Authorization (EUA). This EUA will remain in effect (meaning this test can be used) for the duration of the COVID-19 declaration under Section 564(b)(1) of the Act, 21  U.S.C. section 360bbb-3(b)(1), unless the authorization is terminated or revoked.  Performed at [facility], [address], [place], [place] [address]   Urine culture     Status: Abnormal   Collection Time: 03/07/21 10:39 PM   Specimen: Urine, Random  Result Value Ref Range Status   Specimen Description   Final    URINE, RANDOM Performed at [facility], [address] [place], [place] [address]    Special Requests   Final    NONE Performed at [facility], [address] [place], [place] [address]    Culture MULTIPLE SPECIES PRESENT, SUGGEST RECOLLECTION (A)  Final   Report Status 03/09/2021 FINAL  Final  Blood culture (single)     Status: None (Preliminary result)   Collection Time: 03/07/21 10:40 PM   Specimen: BLOOD  Result Value Ref Range Status   Specimen Description BLOOD LEFT ANTECUBITAL  Final   Special Requests  Final    BOTTLES DRAWN AEROBIC AND ANAEROBIC Blood Culture adequate volume   Culture   Final    NO GROWTH 3 DAYS Performed at [facility], [address] [place], [place] [address]    Report Status PENDING  Incomplete  Culture, blood (single)     Status: None (Preliminary result)   Collection Time: 03/07/21 11:56 PM   Specimen: BLOOD  Result Value Ref Range Status   Specimen Description BLOOD LEFT ANTECUBITAL  Final   Special Requests   Final    BOTTLES DRAWN AEROBIC AND ANAEROBIC Blood Culture adequate volume   Culture   Final    NO GROWTH 2 DAYS Performed at [facility], [address], [place], [place] [address]    Report Status PENDING  Incomplete  MRSA PCR Screening     Status: None   Collection Time: 03/08/21  8:14 AM   Specimen: Nasal Mucosa; Nasopharyngeal  Result Value Ref Range Status   MRSA by PCR NEGATIVE NEGATIVE Final    Comment:        The GeneXpert MRSA Assay (FDA approved for NASAL specimens only), is one component of a comprehensive MRSA colonization surveillance  program. It is not intended to diagnose MRSA infection nor to guide or monitor treatment for MRSA infections. Performed at [facility], [place], [place] [address]   Aerobic Culture w Gram Stain (superficial specimen)     Status: None   Collection Time: 03/08/21  9:38 AM   Specimen: Wound  Result Value Ref Range Status   Specimen Description   Final    WOUND Performed at [facility], [address] [place], [place] [address]    Special Requests   Final    SKIN OTHER Performed at [facility], [address] [place], [place] [address]    Gram Stain   Final    RARE WBC PRESENT, PREDOMINANTLY PMN RARE GRAM POSITIVE COCCI Performed at [facility], [place] [place], [place] [address]    Culture   Final    ABUNDANT METHICILLIN RESISTANT STAPHYLOCOCCUS AUREUS   Report Status 03/10/2021 FINAL  Final   Organism ID, Bacteria METHICILLIN RESISTANT STAPHYLOCOCCUS AUREUS  Final      Susceptibility   Methicillin resistant staphylococcus aureus - MIC*    CIPROFLOXACIN >=8 RESISTANT Resistant     ERYTHROMYCIN >=8 RESISTANT Resistant     GENTAMICIN <=0.5 SENSITIVE Sensitive     OXACILLIN >=4 RESISTANT Resistant     TETRACYCLINE <=1 SENSITIVE Sensitive     VANCOMYCIN 1 SENSITIVE Sensitive     TRIMETH/SULFA <=10 SENSITIVE Sensitive     CLINDAMYCIN <=0.25 SENSITIVE Sensitive     RIFAMPIN <=0.5 SENSITIVE Sensitive     Inducible Clindamycin NEGATIVE Sensitive     * ABUNDANT METHICILLIN RESISTANT STAPHYLOCOCCUS AUREUS     Radiology Studies: No results found.  Scheduled Meds: . Chlorhexidine Gluconate Cloth  6 each Topical Q0600  . heparin  5,000 Units Subcutaneous Q8H  . multivitamin with minerals  1 tablet Oral Daily  . pantoprazole  40 mg Oral Daily  . phosphorus  500 mg Oral BID   Continuous Infusions: . sodium chloride Stopped (03/09/21 1300)  . ceFEPime (MAXIPIME) IV 2 g (03/10/21 1114)  . vancomycin 500 mg (03/10/21  0023)     LOS: 2 days   Time spent: 35 minutes. More than 50% of the time was spent in counseling/coordination of care  Lorella Nimrod, MD [facility]  If 7PM-7AM, please contact night-coverage [email or web address]  03/10/2021, 1:16 PM   This record has been created using Systems analyst. Errors have been sought and corrected,but may not always be located. Such creation errors do not reflect on the standard of care.

## 2021-03-11 DIAGNOSIS — N390 Urinary tract infection, site not specified: Secondary | ICD-10-CM

## 2021-03-11 DIAGNOSIS — N179 Acute kidney failure, unspecified: Secondary | ICD-10-CM

## 2021-03-11 DIAGNOSIS — E876 Hypokalemia: Secondary | ICD-10-CM

## 2021-03-11 LAB — BASIC METABOLIC PANEL
Anion gap: 11 (ref 5–15)
BUN: 16 mg/dL (ref 6–20)
CO2: 22 mmol/L (ref 22–32)
Calcium: 8.6 mg/dL — ABNORMAL LOW (ref 8.9–10.3)
Chloride: 110 mmol/L (ref 98–111)
Creatinine, Ser: 1.1 mg/dL (ref 0.61–1.24)
GFR, Estimated: >60 mL/min (ref >60)
Glucose, Bld: 102 mg/dL — ABNORMAL HIGH (ref 70–99)
Potassium: 3.5 mmol/L (ref 3.5–5.1)
Sodium: 143 mmol/L (ref 135–145)

## 2021-03-11 LAB — PHOSPHORUS: Phosphorus: 2.2 mg/dL — ABNORMAL LOW (ref 2.5–4.6)

## 2021-03-11 LAB — MAGNESIUM: Magnesium: 2 mg/dL (ref 1.7–2.4)

## 2021-03-11 MED ORDER — DICYCLOMINE HCL 10 MG PO CAPS
10.0000 mg | ORAL_CAPSULE | Freq: Three times a day (TID) | ORAL | Status: DC
  Administered 2021-03-11: 10 mg via ORAL
  Filled 2021-03-11 (×3): qty 1

## 2021-03-11 MED ORDER — POTASSIUM CHLORIDE CRYS ER 20 MEQ PO TBCR
40.0000 meq | EXTENDED_RELEASE_TABLET | Freq: Once | ORAL | Status: AC
  Administered 2021-03-11: 40 meq via ORAL
  Filled 2021-03-11: qty 2

## 2021-03-11 MED ORDER — SUCRALFATE 1 G PO TABS
1.0000 g | ORAL_TABLET | Freq: Three times a day (TID) | ORAL | Status: DC
  Administered 2021-03-11: 1 g via ORAL
  Filled 2021-03-11: qty 1

## 2021-03-11 MED ORDER — TRAZODONE HCL 50 MG PO TABS: 50.0000 mg | ORAL_TABLET | Freq: Every day | ORAL | Status: DC

## 2021-03-11 MED ORDER — DOXYCYCLINE HYCLATE 100 MG PO TABS: 100.0000 mg | ORAL_TABLET | Freq: Two times a day (BID) | ORAL | 0 refills | Status: AC

## 2021-03-11 MED ORDER — ROSUVASTATIN CALCIUM 10 MG PO TABS: 10.0000 mg | ORAL_TABLET | Freq: Every day | ORAL | Status: DC

## 2021-03-11 MED ORDER — K PHOS MONO-SOD PHOS DI & MONO 155-852-130 MG PO TABS
500.0000 mg | ORAL_TABLET | Freq: Two times a day (BID) | ORAL | Status: DC
  Administered 2021-03-11: 500 mg via ORAL
  Filled 2021-03-11 (×2): qty 2

## 2021-03-11 NOTE — Consult Note (Addendum)
[place] for Electrolyte Monitoring and Replacement   Recent Labs: Potassium (mmol/L)  Date Value  03/11/2021 3.5   Magnesium (mg/dL)  Date Value  03/11/2021 2.0   Calcium (mg/dL)  Date Value  03/11/2021 8.6 (L)   Albumin (g/dL)  Date Value  03/08/2021 2.7 (L)   Phosphorus (mg/dL)  Date Value  03/11/2021 2.2 (L)   Sodium (mmol/L)  Date Value  03/11/2021 143   Assessment: 55 yo male with medical history including Spina bifida, chronic colostomy and urostomy, Hx C diff colitis who is admitted with suspected urosepsis. Also noted to have abscess in left gluteal fold which is freely draining. Pharmacy to monitor/replenish electrolytes per protocol  Goal of Therapy:  Electrolytes within normal limits  Plan:  --Potassium 40 mEq PO x 1 doses --K Phos Neutral 500 mg PO x 2 doses  --Re-check electrolytes with AM labs tomorrow  Lu Duffel, PharmD, BCPS Clinical Pharmacist 03/11/2021 9:28 AM

## 2021-03-11 NOTE — Progress Notes (Signed)
Left voice mail for sister.   Fuller Mandril, RN

## 2021-03-11 NOTE — Discharge Instructions (Signed)
Sepsis, Diagnosis, Adult Sepsis is a serious bodily reaction to an infection. The infection that triggers sepsis may be from a bacteria, virus, or fungus. Sepsis can result from an infection in any part of your body. Infections that commonly lead to sepsis include skin, lung, and urinary tract infections. Sepsis is a medical emergency that must be treated right away in a hospital. In severe cases, it can lead to septic shock. Septic shock can weaken your heart and cause your blood pressure to drop. This can cause your central nervous system and your body's organs to stop working. What are the causes? This condition is caused by a severe reaction to infections from bacteria, viruses, or fungus. The germs that most often lead to sepsis include:  Escherichia coli (E. coli) bacteria.  Staphylococcus aureus (staph) bacteria.  Some types of Streptococcus bacteria. The most common infections affect these organs:  The lung (pneumonia).  The kidneys or bladder (urinary tract infection).  The skin (cellulitis).  The bowel, gallbladder, or pancreas. What increases the risk? You are more likely to develop this condition if:  Your body's disease-fighting system (immune system) is weakened.  You are age 65 or older.  You are male.  You had surgery or you have been hospitalized.  You have these devices inserted into your body: ? A small, thin tube (catheter). ? IV line. ? Breathing tube. ? Drainage tube.  You are not getting enough nutrients from food (malnourished).  You have a long-term (chronic) disease, such as cancer, lung disease, kidney disease, or diabetes.  You are African American. What are the signs or symptoms? Symptoms of this condition may include:  Fever.  Chills or feeling very cold.  Confusion or anxiety.  Fatigue.  Muscle aches.  Shortness of breath.  Nausea and vomiting.  Urinating much less than usual.  Fast heart rate (tachycardia).  Rapid  breathing (hyperventilation).  Changes in skin color. Your skin may look blotchy, pale, or blue.  Cool, clammy, or sweaty skin.  Skin rash. Other symptoms depend on the source of your infection. How is this diagnosed? This condition is diagnosed based on:  Your symptoms.  Your medical history.  A physical exam. Other tests may also be done to find out the cause of the infection and how severe the sepsis is. These tests may include:  Blood tests.  Urine tests.  Swabs from other areas of your body that may have an infection. These samples may be tested (cultured) to find out what type of bacteria is causing the infection.  Chest X-ray to check for pneumonia. Other imaging tests, such as a CT scan, may also be done.  Lumbar puncture. This removes a small amount of the fluid that surrounds your brain and spinal cord. The fluid is then examined for infection.   How is this treated? This condition must be treated in a hospital. Based on the cause of your infection, you may be given an antibiotic, antiviral, or antifungal medicine. You may also receive:  Fluids through an IV.  Oxygen and breathing assistance.  Medicines to increase your blood pressure.  Kidney dialysis. This process cleans your blood if your kidneys have failed.  Surgery to remove infected tissue.  Blood transfusion if needed.  Medicine to prevent blood clots.  Nutrients to correct imbalances in basic body function (metabolism). You may: ? Receive important salts and minerals (electrolytes) through an IV. ? Have your blood sugar level adjusted. Follow these instructions at home: Medicines  Take over-the-counter   and prescription medicines only as told by your health care provider.  If you were prescribed an antibiotic, antiviral, or antifungal medicine, take it as told by your health care provider. Do not stop taking the medicine even if you start to feel better.   General instructions  If you have a  catheter or other indwelling device, ask to have it removed as soon as possible.  Keep all follow-up visits as told by your health care provider. This is important. Contact a health care provider if:  You do not feel like you are getting better or regaining strength.  You are having trouble coping with your recovery.  You frequently feel tired.  You feel worse or do not seem to get better after surgery.  You think you may have an infection after surgery. Get help right away if:  You have any symptoms of sepsis.  You have difficulty breathing.  You have a rapid or skipping heartbeat.  You become confused or disoriented.  You have a high fever.  Your skin becomes blotchy, pale, or blue.  You have an infection that is getting worse or not getting better. These symptoms may represent a serious problem that is an emergency. Do not wait to see if the symptoms will go away. Get medical help right away. Call your local emergency services (911 in the [place] Do not drive yourself to the hospital. Summary  Sepsis is a medical emergency that requires immediate treatment in a hospital.  This condition is caused by a severe reaction to infections from bacteria, viruses, or fungus.  Based on the cause of your infection, you may be given an antibiotic, antiviral, or antifungal medicine.  Treatment may also include IV fluids, breathing assistance, and kidney dialysis. This information is not intended to replace advice given to you by your health care provider. Make sure you discuss any questions you have with your health care provider. Document Revised: 04/28/2018 Document Reviewed: 04/28/2018 Elsevier Patient Education  2021 Elsevier Inc.  

## 2021-03-11 NOTE — Progress Notes (Signed)
Spoke with sister. She is heading home from [place] to pick up her Lucianne Lei and then will come to pick up the patient.   Fuller Mandril, RN

## 2021-03-11 NOTE — Care Management Important Message (Signed)
Important Message  Patient Details  Name: [name] MRN: [id number] Date of Birth: 1965-10-28   Medicare Important Message Given:  Yes  Reviewed Medicare IM with patient via room phone due to isolation status.  Declined copy of Medicare IM at this time.     Dannette Barbara 03/11/2021, 11:51 AM

## 2021-03-12 LAB — CULTURE, BLOOD (SINGLE)
Culture: NO GROWTH
Special Requests: ADEQUATE

## 2021-03-14 LAB — CULTURE, BLOOD (SINGLE)
Culture: NO GROWTH
Special Requests: ADEQUATE

## 2021-03-14 NOTE — Discharge Summary (Signed)
at [facility] NAME: [name]    MR#:  [id number]  DATE OF BIRTH:  29-Aug-1966  DATE OF ADMISSION:  03/07/2021   ADMITTING PHYSICIAN: Lorella Nimrod, MD  DATE OF DISCHARGE: 03/11/2021  8:01 PM  PRIMARY CARE PHYSICIAN: Idelle Crouch, MD   ADMISSION DIAGNOSIS:  Hypokalemia [E87.6] Acute UTI [N39.0] AKI (acute kidney injury) ([place]) [N17.9] Septic shock ([place]) [A41.9, R65.21] Sepsis due to gram-negative UTI ([place]) [A41.50, N39.0] Acute sepsis ([place]) [A41.9] DISCHARGE DIAGNOSIS:  Active Problems:   Acute sepsis ([place])   Acute UTI   AKI (acute kidney injury) ([place])   Sepsis due to gram-negative UTI ([place])   Septic shock ([place])  SECONDARY DIAGNOSIS:   Past Medical History:  Diagnosis Date  . Decubitus ulcer   . Renal disorder   . Spina bifida [facility])    HOSPITAL COURSE:  55 y.o. male with history of spina bifida, neurogenic bladder, chronic bilateral hydronephrosis, known bilateral nephrolithiasis, hypertension, hyperlipidemia, CKD stage III AAA previous urinary tract infections and sepsis admitted for abdominal pain, nausea, subjective fevers.    Patient was febrile and tachycardic on presentation.  Lactic acid 2.4, leukocytosis with left shift, UA with leukocytes and nitrates.  CT abdomen with large bilateral staghorn calculi increased from prior study and persistent left-sided hydronephrosis with suspected proximal left ureteral stricture. Patient later developed persistent hypotension not responding to IV fluid boluses requiring pressor support and was admitted in ICU on Levophed. He was weaned off and transfer to floors. He was also found to have a draining left buttock abscess, surgery was consulted and they are not recommending any procedure as abscess was already draining.  Abscess cultures growing MRSA and antibiotics were switched to doxycycline per c/s   Sepsis secondary to UTI with septic shock.  POA and now resolved. Blood cultures  negative, wound cultures with MRSA and urine cultures with multiple growth. -Antibiotics were switched with doxycycline per c/s   Buttock abscess.  Surgery was consulted initially but there is no need for any incision and drainage as it was self draining.  Wound cultures growing MRSA. -treated with IV Abx while in the hospital. Switched to PO Doxy at D/C   AKI with CKD stage IIIa.  Improved with hydration and at baseline   Chronic hydronephrosis with chronic bilateral nephrolithiasis.  Repeat CT abdomen with some concerning of enlarging bilateral staghorn calculi.  Patient was supposed to see [facility] urology as an outpatient after prior hospitalization. -Outpatient urology follow-up for definitive management of his nephrolithiasis to prevent recurrent UTI. -Patient currently has urostomy tube in place.   Hypokalemia  Hypophosphatemia -Repleted   Hypertension. -controlled   Spina bifida ([place]) -s/p urostomy and colostomy   DISCHARGE CONDITIONS:  stable CONSULTS OBTAINED:   DRUG ALLERGIES:   Allergies  Allergen Reactions  . Morphine And Related Nausea And Vomiting  . Sulfa Antibiotics Other (See Comments)    Reaction: unknown  . Ivp Dye [Iodinated Diagnostic Agents] Hives  . Latex Rash   DISCHARGE MEDICATIONS:   Allergies as of 03/11/2021       Reactions   Morphine And Related Nausea And Vomiting   Sulfa Antibiotics Other (See Comments)   Reaction: unknown   Ivp Dye [iodinated Diagnostic Agents] Hives   Latex Rash        Medication List     TAKE these medications    AZO CRANBERRY PO Take 1 tablet by mouth daily.   CENTRUM MEN PO Take 1 tablet by  mouth daily.   dicyclomine 10 MG capsule Commonly known as: BENTYL Take 10 mg by mouth 3 (three) times daily.   doxycycline 100 MG tablet Commonly known as: VIBRA-TABS Take 1 tablet (100 mg total) by mouth every 12 (twelve) hours for 7 days.   hydrochlorothiazide 25 MG tablet Commonly known as: HYDRODIURIL Take 25  mg by mouth daily.   pantoprazole 40 MG tablet Commonly known as: PROTONIX Take 1 tablet (40 mg total) by mouth daily.   potassium chloride SA 20 MEQ tablet Commonly known as: KLOR-CON Take 20 mEq by mouth daily.   rosuvastatin 10 MG tablet Commonly known as: CRESTOR Take 10 mg by mouth at bedtime.   sucralfate 1 g tablet Commonly known as: CARAFATE Take 1 g by mouth 3 (three) times daily.   traZODone 50 MG tablet Commonly known as: DESYREL Take 50 mg by mouth at bedtime.               Discharge Care Instructions  (From admission, onward)           Start     Ordered   03/11/21 0000  Discharge wound care:       Comments: As above   03/11/21 1138           DISCHARGE INSTRUCTIONS:   DIET:  Regular diet DISCHARGE CONDITION:  Stable ACTIVITY:  Activity as tolerated OXYGEN:  Home Oxygen: No.  Oxygen Delivery: room air DISCHARGE LOCATION:  home   If you experience worsening of your admission symptoms, develop shortness of breath, life threatening emergency, suicidal or homicidal thoughts you must seek medical attention immediately by calling 911 or calling your MD immediately  if symptoms less severe.  You Must read complete instructions/literature along with all the possible adverse reactions/side effects for all the Medicines you take and that have been prescribed to you. Take any new Medicines after you have completely understood and accpet all the possible adverse reactions/side effects.   Please note  You were cared for by a hospitalist during your hospital stay. If you have any questions about your discharge medications or the care you received while you were in the hospital after you are discharged, you can call the unit and asked to speak with the hospitalist on call if the hospitalist that took care of you is not available. Once you are discharged, your primary care physician will handle any further medical issues. Please note that NO REFILLS for  any discharge medications will be authorized once you are discharged, as it is imperative that you return to your primary care physician (or establish a relationship with a primary care physician if you do not have one) for your aftercare needs so that they can reassess your need for medications and monitor your lab values.    On the day of Discharge:  VITAL SIGNS:  Blood pressure 110/61, pulse 73, temperature 97.6 F (36.4 C), temperature source Oral, resp. rate 18, height 4\' 6"  (1.372 m), weight 55.3 kg, SpO2 100 %. PHYSICAL EXAMINATION:  GENERAL:  55 y.o.-year-old patient lying in the bed with no acute distress.  EYES: Pupils equal, round, reactive to light and accommodation. No scleral icterus. Extraocular muscles intact.  HEENT: Head atraumatic, normocephalic. Oropharynx and nasopharynx clear.  NECK:  Supple, no jugular venous distention. No thyroid enlargement, no tenderness.  LUNGS: Normal breath sounds bilaterally, no wheezing, rales,rhonchi or crepitation. No use of accessory muscles of respiration.  CARDIOVASCULAR: S1, S2 normal. No murmurs, rubs, or gallops.  ABDOMEN:  Soft, non-tender, non-distended. Bowel sounds present. No organomegaly or mass. Colostomy and Urostomy in place EXTREMITIES: No pedal edema, cyanosis, or clubbing.  NEUROLOGIC: Cranial nerves II through XII are intact. Muscle strength 5/5 in all extremities. Sensation intact. Gait not checked.  PSYCHIATRIC: The patient is alert and oriented x 3.  SKIN: No obvious rash, lesion, or ulcer.  DATA REVIEW:   CBC Recent Labs  Lab 03/10/21 0402  WBC 10.3  HGB 12.3*  HCT 38.8*  PLT 287    Chemistries  Recent Labs  Lab 03/08/21 0429 03/09/21 0635 03/11/21 0530  NA 136   < > 143  K 3.0*   < > 3.5  CL 102   < > 110  CO2 23   < > 22  GLUCOSE 114*   < > 102*  BUN 23*   < > 16  CREATININE 1.41*   < > 1.10  CALCIUM 7.8*   < > 8.6*  MG 1.9   < > 2.0  AST 20  --   --   ALT 14  --   --   ALKPHOS 63  --   --    BILITOT 1.3*  --   --    < > = values in this interval not displayed.     Outpatient follow-up  Follow-up Information     Idelle Crouch, MD. Go on 03/17/2021.   Specialty: Internal Medicine Why: 10am appointment Contact information: [address] [place] [address] [phone]         Viprakasit, Marlowe [place], MD. Go on 03/25/2021.   Specialty: Urology Why: [place] 9:15am  Contact information: [address] [facility] [place] [address] [phone]                 30 Day Unplanned Readmission Risk Score    Flowsheet Row ED to Hosp-Admission (Discharged) from 03/07/2021 in [facility]  30 Day Unplanned Readmission Risk Score (%) 15.77 Filed at 03/11/2021 1600       This score is the patient's risk of an unplanned readmission within 30 days of being discharged (0 -100%). The score is based on dignosis, age, lab data, medications, orders, and past utilization.   Low:  0-14.9   Medium: 15-21.9   High: 22-29.9   Extreme: 30 and above           Management plans discussed with the patient, family and they are in agreement.  CODE STATUS: Prior   TOTAL TIME TAKING CARE OF THIS PATIENT: 45 minutes.    Max Sane M.D on 03/14/2021 at 4:29 PM  [facility]   CC: Primary care physician; Idelle Crouch, MD   Note: This dictation was prepared with Dragon dictation along with smaller phrase technology. Any transcriptional errors that result from this process are unintentional.

## 2021-04-09 ENCOUNTER — Other Ambulatory Visit: Payer: Self-pay

## 2021-04-09 ENCOUNTER — Ambulatory Visit (INDEPENDENT_AMBULATORY_CARE_PROVIDER_SITE_OTHER): Payer: Medicare HMO | Admitting: Urology

## 2021-04-09 ENCOUNTER — Encounter: Payer: Self-pay | Admitting: Urology

## 2021-04-09 VITALS — BP 132/78 | HR 84 | Wt 121.0 lb

## 2021-04-09 DIAGNOSIS — N1330 Unspecified hydronephrosis: Secondary | ICD-10-CM | POA: Diagnosis not present

## 2021-04-09 DIAGNOSIS — N319 Neuromuscular dysfunction of bladder, unspecified: Secondary | ICD-10-CM | POA: Diagnosis not present

## 2021-04-09 DIAGNOSIS — N200 Calculus of kidney: Secondary | ICD-10-CM

## 2021-04-09 NOTE — [facility] Notes (Signed)
04/09/2021 5:52 PM   [name] 15-May-1966 [id number]  Referring provider: Idelle Crouch, MD [facility] [place],  [place] [address]  Chief Complaint  Patient presents with   New Patient (Initial Visit)    HPI: [name] is a 55 y.o. male who presents for follow-up of recent hospitalization.  He presents with his sister who is his legal guardian  Spina bifida with long history of recurrent stone disease and neurogenic bladder Prior ileal conduit with urostomy drainage Numerous previous stone surgeries with most recent bilateral PCNL September 2020 Admitted [facility] 03/07/2021 with sepsis/septic shock secondary to UTI Urine culture grew multiple species and blood cultures were negative Noncontrast CT remarkable for significant stone burden left calyces and left hydronephrosis without stone and multiple right renal calculi partially extending into the right renal pelvis Also noted to have draining abscess left buttock and was evaluated by general surgery Did not follow-up at [facility] after his last procedure in 2020 Presently has no complaints   PMH: Past Medical History:  Diagnosis Date   Abdominal distention 10/11/2016   Acute lower UTI 12/22/2019   Acute sepsis ([place]) 04/21/2016   AKI (acute kidney injury) ([place]) 06/27/2020   Decubitus ulcer    Distal intestinal obstruction syndrome ([place]) 10/11/2016   Fecal impaction in rectum ([place]) 10/10/2016   Gastric outlet obstruction 06/03/2016   Gastritis    GERD (gastroesophageal reflux disease) 12/22/2019   HTN (hypertension) 06/27/2020   Hydronephrosis    Hyperlipidemia    Hypokalemia 10/11/2016   Impaction of the bowels ([place])    Kidney stone    Left ureteral stone 06/27/2020   Leukocytosis 10/11/2016   Loose stools 10/11/2016   Nausea & vomiting 06/03/2016   Renal disorder    SBO (small bowel obstruction) ([place]) 11/02/2017   Sepsis due to gram-negative UTI ([place]) 03/08/2021   Septic shock ([place]) 03/08/2021    SIRS (systemic inflammatory response syndrome) ([place]) 06/03/2016   Spina bifida ([place])    Ulcer of esophagus with bleeding     Surgical History: Past Surgical History:  Procedure Laterality Date   ABDOMINAL SURGERY     APPENDECTOMY     BACK SURGERY     COLON SURGERY     CREATION / REVISION OF ILEOSTOMY / JEJUNOSTOMY  05/2015   CYSTOSCOPY WITH RETROGRADE PYELOGRAM, URETEROSCOPY AND STENT PLACEMENT  03/2014   ESOPHAGOGASTRODUODENOSCOPY (EGD) WITH PROPOFOL N/A 06/03/2016   Procedure: ESOPHAGOGASTRODUODENOSCOPY (EGD) WITH PROPOFOL;  Surgeon: [name], MD;  Location: [facility] ENDOSCOPY;  Service: Endoscopy;  Laterality: N/A;   ESOPHAGOGASTRODUODENOSCOPY (EGD) WITH PROPOFOL N/A 04/27/2019   Procedure: ESOPHAGOGASTRODUODENOSCOPY (EGD) WITH PROPOFOL;  Surgeon: Jonathon Bellows, MD;  Location[facility] ENDOSCOPY;  Service: Gastroenterology;  Laterality: N/A;   ilieostomy     PERCUTANEOUS NEPHROSTOLITHOTOMY     PERCUTANEOUS NEPHROSTOMY     VENTRICULOPERITONEAL SHUNT     vp shunt removal      Home Medications:  Allergies as of 04/09/2021       Reactions   Morphine And Related Nausea And Vomiting   Sulfa Antibiotics Other (See Comments)   Reaction: unknown   Ivp Dye [iodinated Diagnostic Agents] Hives   Latex Rash        Medication List        Accurate as of April 09, 2021  5:52 PM. If you have any questions, ask your nurse or doctor.          AZO CRANBERRY PO Take 1 tablet by mouth daily.  CENTRUM MEN PO Take 1 tablet by mouth daily.   dicyclomine 10 MG capsule Commonly known as: BENTYL Take 10 mg by mouth 3 (three) times daily.   hydrochlorothiazide 25 MG tablet Commonly known as: HYDRODIURIL Take 25 mg by mouth daily.   pantoprazole 40 MG tablet Commonly known as: PROTONIX Take 1 tablet (40 mg total) by mouth daily.   potassium chloride SA 20 MEQ tablet Commonly known as: KLOR-CON Take 20 mEq by mouth daily.   rosuvastatin 10 MG tablet Commonly known as: CRESTOR Take 10  mg by mouth at bedtime.   sucralfate 1 g tablet Commonly known as: CARAFATE Take 1 g by mouth 3 (three) times daily.   traZODone 50 MG tablet Commonly known as: DESYREL Take 50 mg by mouth at bedtime.        Allergies:  Allergies  Allergen Reactions   Morphine And Related Nausea And Vomiting   Sulfa Antibiotics Other (See Comments)    Reaction: unknown   Ivp Dye [Iodinated Diagnostic Agents] Hives   Latex Rash    Family History: Family History  Problem Relation Age of Onset   Diabetes Mellitus II Father    Bladder Cancer Neg Hx    Kidney cancer Neg Hx    Prostate cancer Neg Hx     Social History:  reports that he has never smoked. He has never used smokeless tobacco. He reports that he does not drink alcohol and does not use drugs.   Physical Exam: BP 132/78   Pulse 84   Wt 121 lb (54.9 kg)   BMI 29.17 kg/m   Constitutional:  Alert, No acute distress. HEENT: [place] AT, moist mucus membranes.  Trachea midline, no masses. Cardiovascular: No clubbing, cyanosis, or edema. Respiratory: Normal respiratory effort, no increased work of breathing. Psychiatric: Normal mood and affect.   Pertinent Imaging: Multiple CTs were reviewed and interpreted dating back to 2020.  A CT 12/2019 which was performed approximately 6 months after his last procedure has less stone burden when compared with his most recent CT of 03/07/2021; more so on the right as compared to the left   Assessment & Plan:    1.  Bilateral nephrolithiasis Left-sided stone burden although substantial and is limited to the renal calyces; right sided stone burden is partially in the right renal pelvis He has a complicated stone history and is an established patient at [facility] and referral was placed for follow-up  2.  Spina bifida with neurogenic bladder Status post ileal conduit  I spent 50 total minutes on the day of the encounter including pre-visit review of the medical record, face-to-face time with the  patient, and post visit ordering of labs/imaging/tests.   Abbie Sons, [place] [address], [place], [place] [address] [phone]

## 2021-04-13 ENCOUNTER — Telehealth: Payer: Self-pay | Admitting: *Deleted

## 2021-04-13 NOTE — Telephone Encounter (Signed)
-----   Message from Abbie Sons, MD sent at 04/11/2021  9:43 PM EDT ----- Regarding: [facility] referral Due to his complicated stone history and is established care at [facility] our providers here who deal with complicated stones feel he would best be served by continued follow-up at [facility].  I did place a referral for a follow-up appointment.

## 2021-05-04 DIAGNOSIS — Q059 Spina bifida, unspecified: Secondary | ICD-10-CM | POA: Diagnosis not present

## 2021-05-15 DIAGNOSIS — Q059 Spina bifida, unspecified: Secondary | ICD-10-CM | POA: Diagnosis not present

## 2021-05-15 DIAGNOSIS — Z936 Other artificial openings of urinary tract status: Secondary | ICD-10-CM | POA: Diagnosis not present

## 2021-05-15 DIAGNOSIS — C679 Malignant neoplasm of bladder, unspecified: Secondary | ICD-10-CM | POA: Diagnosis not present

## 2021-06-16 DIAGNOSIS — Z936 Other artificial openings of urinary tract status: Secondary | ICD-10-CM | POA: Diagnosis not present

## 2021-06-16 DIAGNOSIS — C679 Malignant neoplasm of bladder, unspecified: Secondary | ICD-10-CM | POA: Diagnosis not present

## 2021-06-16 DIAGNOSIS — Q059 Spina bifida, unspecified: Secondary | ICD-10-CM | POA: Diagnosis not present

## 2021-07-17 DIAGNOSIS — C679 Malignant neoplasm of bladder, unspecified: Secondary | ICD-10-CM | POA: Diagnosis not present

## 2021-07-17 DIAGNOSIS — Q059 Spina bifida, unspecified: Secondary | ICD-10-CM | POA: Diagnosis not present

## 2021-07-17 DIAGNOSIS — Z936 Other artificial openings of urinary tract status: Secondary | ICD-10-CM | POA: Diagnosis not present

## 2021-07-27 DIAGNOSIS — R8290 Unspecified abnormal findings in urine: Secondary | ICD-10-CM | POA: Diagnosis not present

## 2021-07-27 DIAGNOSIS — Z79899 Other long term (current) drug therapy: Secondary | ICD-10-CM | POA: Diagnosis not present

## 2021-07-27 DIAGNOSIS — E782 Mixed hyperlipidemia: Secondary | ICD-10-CM | POA: Diagnosis not present

## 2021-07-27 DIAGNOSIS — Z23 Encounter for immunization: Secondary | ICD-10-CM | POA: Diagnosis not present

## 2021-08-12 DIAGNOSIS — Z936 Other artificial openings of urinary tract status: Secondary | ICD-10-CM | POA: Diagnosis not present

## 2021-08-12 DIAGNOSIS — Q059 Spina bifida, unspecified: Secondary | ICD-10-CM | POA: Diagnosis not present

## 2021-08-12 DIAGNOSIS — C679 Malignant neoplasm of bladder, unspecified: Secondary | ICD-10-CM | POA: Diagnosis not present

## 2021-09-19 ENCOUNTER — Emergency Department: Payer: Medicare HMO

## 2021-09-19 ENCOUNTER — Encounter: Payer: Self-pay | Admitting: Emergency Medicine

## 2021-09-19 ENCOUNTER — Inpatient Hospital Stay
Admission: EM | Admit: 2021-09-19 | Discharge: 2021-09-23 | DRG: 463 | Disposition: A | Discharge status: Home Health | Payer: Medicare HMO | Attending: Internal Medicine | Admitting: Internal Medicine

## 2021-09-19 ENCOUNTER — Other Ambulatory Visit: Payer: Self-pay

## 2021-09-19 DIAGNOSIS — N1831 Chronic kidney disease, stage 3a: Secondary | ICD-10-CM | POA: Diagnosis present

## 2021-09-19 DIAGNOSIS — Z91041 Radiographic dye allergy status: Secondary | ICD-10-CM

## 2021-09-19 DIAGNOSIS — Z6829 Body mass index (BMI) 29.0-29.9, adult: Secondary | ICD-10-CM

## 2021-09-19 DIAGNOSIS — L98419 Non-pressure chronic ulcer of buttock with unspecified severity: Secondary | ICD-10-CM | POA: Diagnosis not present

## 2021-09-19 DIAGNOSIS — I10 Essential (primary) hypertension: Secondary | ICD-10-CM | POA: Diagnosis present

## 2021-09-19 DIAGNOSIS — N183 Chronic kidney disease, stage 3 unspecified: Secondary | ICD-10-CM | POA: Diagnosis present

## 2021-09-19 DIAGNOSIS — G8220 Paraplegia, unspecified: Secondary | ICD-10-CM | POA: Diagnosis not present

## 2021-09-19 DIAGNOSIS — Z933 Colostomy status: Secondary | ICD-10-CM

## 2021-09-19 DIAGNOSIS — K311 Adult hypertrophic pyloric stenosis: Secondary | ICD-10-CM | POA: Diagnosis present

## 2021-09-19 DIAGNOSIS — L89319 Pressure ulcer of right buttock, unspecified stage: Secondary | ICD-10-CM | POA: Diagnosis not present

## 2021-09-19 DIAGNOSIS — Z91040 Latex allergy status: Secondary | ICD-10-CM

## 2021-09-19 DIAGNOSIS — R64 Cachexia: Secondary | ICD-10-CM | POA: Diagnosis not present

## 2021-09-19 DIAGNOSIS — M8668 Other chronic osteomyelitis, other site: Secondary | ICD-10-CM | POA: Diagnosis not present

## 2021-09-19 DIAGNOSIS — M24452 Recurrent dislocation, left hip: Secondary | ICD-10-CM | POA: Diagnosis present

## 2021-09-19 DIAGNOSIS — Z8249 Family history of ischemic heart disease and other diseases of the circulatory system: Secondary | ICD-10-CM

## 2021-09-19 DIAGNOSIS — I129 Hypertensive chronic kidney disease with stage 1 through stage 4 chronic kidney disease, or unspecified chronic kidney disease: Secondary | ICD-10-CM | POA: Diagnosis present

## 2021-09-19 DIAGNOSIS — N132 Hydronephrosis with renal and ureteral calculous obstruction: Secondary | ICD-10-CM | POA: Diagnosis present

## 2021-09-19 DIAGNOSIS — Z882 Allergy status to sulfonamides status: Secondary | ICD-10-CM

## 2021-09-19 DIAGNOSIS — Z87440 Personal history of urinary (tract) infections: Secondary | ICD-10-CM | POA: Diagnosis not present

## 2021-09-19 DIAGNOSIS — I714 Abdominal aortic aneurysm, without rupture, unspecified: Secondary | ICD-10-CM | POA: Diagnosis present

## 2021-09-19 DIAGNOSIS — E785 Hyperlipidemia, unspecified: Secondary | ICD-10-CM | POA: Diagnosis not present

## 2021-09-19 DIAGNOSIS — K219 Gastro-esophageal reflux disease without esophagitis: Secondary | ICD-10-CM | POA: Diagnosis present

## 2021-09-19 DIAGNOSIS — Z885 Allergy status to narcotic agent status: Secondary | ICD-10-CM

## 2021-09-19 DIAGNOSIS — M4628 Osteomyelitis of vertebra, sacral and sacrococcygeal region: Secondary | ICD-10-CM | POA: Diagnosis not present

## 2021-09-19 DIAGNOSIS — M8608 Acute hematogenous osteomyelitis, other sites: Secondary | ICD-10-CM

## 2021-09-19 DIAGNOSIS — R5381 Other malaise: Secondary | ICD-10-CM | POA: Diagnosis not present

## 2021-09-19 DIAGNOSIS — N400 Enlarged prostate without lower urinary tract symptoms: Secondary | ICD-10-CM | POA: Diagnosis not present

## 2021-09-19 DIAGNOSIS — L89313 Pressure ulcer of right buttock, stage 3: Secondary | ICD-10-CM | POA: Diagnosis not present

## 2021-09-19 DIAGNOSIS — Q059 Spina bifida, unspecified: Secondary | ICD-10-CM | POA: Diagnosis not present

## 2021-09-19 DIAGNOSIS — Z833 Family history of diabetes mellitus: Secondary | ICD-10-CM

## 2021-09-19 DIAGNOSIS — M1612 Unilateral primary osteoarthritis, left hip: Secondary | ICD-10-CM | POA: Diagnosis not present

## 2021-09-19 DIAGNOSIS — L89314 Pressure ulcer of right buttock, stage 4: Secondary | ICD-10-CM | POA: Diagnosis not present

## 2021-09-19 DIAGNOSIS — N179 Acute kidney failure, unspecified: Secondary | ICD-10-CM | POA: Diagnosis not present

## 2021-09-19 DIAGNOSIS — Z8719 Personal history of other diseases of the digestive system: Secondary | ICD-10-CM | POA: Diagnosis not present

## 2021-09-19 DIAGNOSIS — L8990 Pressure ulcer of unspecified site, unspecified stage: Secondary | ICD-10-CM | POA: Insufficient documentation

## 2021-09-19 DIAGNOSIS — M869 Osteomyelitis, unspecified: Secondary | ICD-10-CM | POA: Diagnosis not present

## 2021-09-19 DIAGNOSIS — B957 Other staphylococcus as the cause of diseases classified elsewhere: Secondary | ICD-10-CM | POA: Diagnosis not present

## 2021-09-19 DIAGNOSIS — R531 Weakness: Secondary | ICD-10-CM | POA: Diagnosis not present

## 2021-09-19 DIAGNOSIS — E876 Hypokalemia: Secondary | ICD-10-CM | POA: Diagnosis not present

## 2021-09-19 DIAGNOSIS — Z20822 Contact with and (suspected) exposure to covid-19: Secondary | ICD-10-CM | POA: Diagnosis present

## 2021-09-19 DIAGNOSIS — H5440 Blindness, one eye, unspecified eye: Secondary | ICD-10-CM | POA: Diagnosis present

## 2021-09-19 DIAGNOSIS — N319 Neuromuscular dysfunction of bladder, unspecified: Secondary | ICD-10-CM | POA: Diagnosis present

## 2021-09-19 DIAGNOSIS — M86651 Other chronic osteomyelitis, right thigh: Secondary | ICD-10-CM | POA: Diagnosis present

## 2021-09-19 LAB — COMPREHENSIVE METABOLIC PANEL
ALT: 32 U/L (ref 0–44)
AST: 29 U/L (ref 15–41)
Albumin: 3.3 g/dL — ABNORMAL LOW (ref 3.5–5.0)
Alkaline Phosphatase: 81 U/L (ref 38–126)
Anion gap: 14 (ref 5–15)
BUN: 29 mg/dL — ABNORMAL HIGH (ref 6–20)
CO2: 27 mmol/L (ref 22–32)
Calcium: 9 mg/dL (ref 8.9–10.3)
Chloride: 95 mmol/L — ABNORMAL LOW (ref 98–111)
Creatinine, Ser: 1.47 mg/dL — ABNORMAL HIGH (ref 0.61–1.24)
GFR, Estimated: 56 mL/min — ABNORMAL LOW (ref >60)
Glucose, Bld: 92 mg/dL (ref 70–99)
Potassium: 2.6 mmol/L — CL (ref 3.5–5.1)
Sodium: 136 mmol/L (ref 135–145)
Total Bilirubin: 1 mg/dL (ref 0.3–1.2)
Total Protein: 8.4 g/dL — ABNORMAL HIGH (ref 6.5–8.1)

## 2021-09-19 LAB — CBC WITH DIFFERENTIAL/PLATELET
Abs Immature Granulocytes: 0.03 10*3/uL (ref 0.00–0.07)
Basophils Absolute: 0.1 10*3/uL (ref 0.0–0.1)
Basophils Relative: 1 %
Eosinophils Absolute: 0.2 10*3/uL (ref 0.0–0.5)
Eosinophils Relative: 2 %
HCT: 44.3 % (ref 39.0–52.0)
Hemoglobin: 13.9 g/dL (ref 13.0–17.0)
Immature Granulocytes: 0 %
Lymphocytes Relative: 10 %
Lymphs Abs: 1 10*3/uL (ref 0.7–4.0)
MCH: 26.7 pg (ref 26.0–34.0)
MCHC: 31.4 g/dL (ref 30.0–36.0)
MCV: 85 fL (ref 80.0–100.0)
Monocytes Absolute: 0.9 10*3/uL (ref 0.1–1.0)
Monocytes Relative: 9 %
Neutro Abs: 7.8 10*3/uL — ABNORMAL HIGH (ref 1.7–7.7)
Neutrophils Relative %: 78 %
Platelets: 472 10*3/uL — ABNORMAL HIGH (ref 150–400)
RBC: 5.21 MIL/uL (ref 4.22–5.81)
RDW: 14.3 % (ref 11.5–15.5)
WBC: 9.9 10*3/uL (ref 4.0–10.5)
nRBC: 0 % (ref 0.0–0.2)

## 2021-09-19 LAB — PROTIME-INR
INR: 1.3 — ABNORMAL HIGH (ref 0.8–1.2)
Prothrombin Time: 16.6 seconds — ABNORMAL HIGH (ref 11.4–15.2)

## 2021-09-19 LAB — URINALYSIS, ROUTINE W REFLEX MICROSCOPIC
Bilirubin Urine: NEGATIVE
Glucose, UA: NEGATIVE mg/dL
Ketones, ur: NEGATIVE mg/dL
Nitrite: NEGATIVE
Protein, ur: 100 mg/dL — AB
RBC / HPF: >50 RBC/hpf — ABNORMAL HIGH (ref 0–5)
Specific Gravity, Urine: 1.01 (ref 1.005–1.030)
Squamous Epithelial / HPF: NONE SEEN (ref 0–5)
WBC, UA: >50 WBC/hpf — ABNORMAL HIGH (ref 0–5)
pH: 9 — ABNORMAL HIGH (ref 5.0–8.0)

## 2021-09-19 LAB — RESP PANEL BY RT-PCR (FLU A&B, COVID) ARPGX2
Influenza A by PCR: NEGATIVE
Influenza B by PCR: NEGATIVE
SARS Coronavirus 2 by RT PCR: NEGATIVE

## 2021-09-19 LAB — MAGNESIUM: Magnesium: 2.7 mg/dL — ABNORMAL HIGH (ref 1.7–2.4)

## 2021-09-19 LAB — APTT: aPTT: 42 seconds — ABNORMAL HIGH (ref 24–36)

## 2021-09-19 LAB — LACTIC ACID, PLASMA: Lactic Acid, Venous: 1.3 mmol/L (ref 0.5–1.9)

## 2021-09-19 LAB — SEDIMENTATION RATE: Sed Rate: 32 mm/hr — ABNORMAL HIGH (ref 0–20)

## 2021-09-19 MED ORDER — AZO CRANBERRY 250-30 MG PO TABS: ORAL_TABLET | Freq: Every day | ORAL | Status: DC

## 2021-09-19 MED ORDER — DICYCLOMINE HCL 20 MG PO TABS
20.0000 mg | ORAL_TABLET | Freq: Two times a day (BID) | ORAL | Status: DC
  Administered 2021-09-20 – 2021-09-23 (×7): 20 mg via ORAL
  Filled 2021-09-19 (×9): qty 1

## 2021-09-19 MED ORDER — ONDANSETRON HCL 4 MG/2ML IJ SOLN: 4.0000 mg | Freq: Three times a day (TID) | INTRAMUSCULAR | Status: DC | PRN

## 2021-09-19 MED ORDER — POTASSIUM CHLORIDE 10 MEQ/100ML IV SOLN: 10.0000 meq | INTRAVENOUS | Status: AC

## 2021-09-19 MED ORDER — TRAZODONE HCL 50 MG PO TABS
50.0000 mg | ORAL_TABLET | Freq: Every day | ORAL | Status: DC
  Administered 2021-09-19 – 2021-09-22 (×4): 50 mg via ORAL
  Filled 2021-09-19 (×4): qty 1

## 2021-09-19 MED ORDER — OXYCODONE-ACETAMINOPHEN 5-325 MG PO TABS
1.0000 | ORAL_TABLET | ORAL | Status: DC | PRN
  Filled 2021-09-19: qty 1

## 2021-09-19 MED ORDER — ACETAMINOPHEN 325 MG PO TABS: 650.0000 mg | ORAL_TABLET | Freq: Four times a day (QID) | ORAL | Status: DC | PRN

## 2021-09-19 MED ORDER — SODIUM CHLORIDE 0.9 % IV SOLN
2.0000 g | Freq: Two times a day (BID) | INTRAVENOUS | Status: DC
  Administered 2021-09-20 – 2021-09-21 (×2): 2 g via INTRAVENOUS
  Filled 2021-09-19 (×3): qty 2

## 2021-09-19 MED ORDER — POTASSIUM CHLORIDE CRYS ER 20 MEQ PO TBCR: 40.0000 meq | EXTENDED_RELEASE_TABLET | Freq: Once | ORAL | Status: DC

## 2021-09-19 MED ORDER — POTASSIUM CHLORIDE CRYS ER 20 MEQ PO TBCR
40.0000 meq | EXTENDED_RELEASE_TABLET | ORAL | Status: AC
  Administered 2021-09-19: 40 meq via ORAL
  Filled 2021-09-19: qty 2

## 2021-09-19 MED ORDER — HYDRALAZINE HCL 20 MG/ML IJ SOLN: 5.0000 mg | INTRAMUSCULAR | Status: DC | PRN

## 2021-09-19 MED ORDER — METRONIDAZOLE 500 MG/100ML IV SOLN
500.0000 mg | Freq: Two times a day (BID) | INTRAVENOUS | Status: DC
  Administered 2021-09-20 – 2021-09-21 (×2): 500 mg via INTRAVENOUS
  Filled 2021-09-19 (×4): qty 100

## 2021-09-19 MED ORDER — ADULT MULTIVITAMIN W/MINERALS CH
1.0000 | ORAL_TABLET | Freq: Every day | ORAL | Status: DC
  Administered 2021-09-20 – 2021-09-23 (×4): 1 via ORAL
  Filled 2021-09-19 (×4): qty 1

## 2021-09-19 MED ORDER — HEPARIN SODIUM (PORCINE) 5000 UNIT/ML IJ SOLN
5000.0000 [IU] | Freq: Three times a day (TID) | INTRAMUSCULAR | Status: DC
  Administered 2021-09-20 – 2021-09-23 (×9): 5000 [IU] via SUBCUTANEOUS
  Filled 2021-09-19 (×9): qty 1

## 2021-09-19 MED ORDER — LACTATED RINGERS IV BOLUS: 1000.0000 mL | Freq: Once | INTRAVENOUS | Status: DC

## 2021-09-19 MED ORDER — VANCOMYCIN HCL IN DEXTROSE 1-5 GM/200ML-% IV SOLN
1000.0000 mg | Freq: Once | INTRAVENOUS | Status: DC
  Filled 2021-09-19: qty 200

## 2021-09-19 MED ORDER — SODIUM CHLORIDE 0.9 % IV SOLN
2.0000 g | Freq: Once | INTRAVENOUS | Status: DC
  Filled 2021-09-19: qty 2

## 2021-09-19 MED ORDER — VANCOMYCIN HCL 500 MG/100ML IV SOLN: 500.0000 mg | INTRAVENOUS | Status: DC

## 2021-09-19 MED ORDER — PANTOPRAZOLE SODIUM 40 MG PO TBEC
40.0000 mg | DELAYED_RELEASE_TABLET | Freq: Every day | ORAL | Status: DC
  Administered 2021-09-19 – 2021-09-23 (×5): 40 mg via ORAL
  Filled 2021-09-19 (×5): qty 1

## 2021-09-19 MED ORDER — SUCRALFATE 1 G PO TABS
1.0000 g | ORAL_TABLET | Freq: Three times a day (TID) | ORAL | Status: DC
  Administered 2021-09-20 – 2021-09-23 (×12): 1 g via ORAL
  Filled 2021-09-19 (×12): qty 1

## 2021-09-19 MED ORDER — ROSUVASTATIN CALCIUM 10 MG PO TABS
10.0000 mg | ORAL_TABLET | Freq: Every day | ORAL | Status: DC
  Administered 2021-09-20 – 2021-09-22 (×3): 10 mg via ORAL
  Filled 2021-09-19 (×4): qty 1

## 2021-09-19 NOTE — Consult Note (Addendum)
[facility] SURGICAL CONSULTATION NOTE    HISTORY OF PRESENT ILLNESS (HPI):  55 y.o. male presented to [facility] ED today for evaluation of right ischial wound. Patient reports that he has had this wound for a little while now.  His sister helps him with his wounds, but this wound had continued to progress despite her attempted treatments.  Patient has a history of spina bifida paraplegia.  He believes he had a pressure wound previously, and believes this was treated with a skin graft of some sort.  He has a surgical history significant for colostomy and ileal conduit urinary diversion secondary to neurogenic bladder.  He denies fever, chills, nausea, vomiting, abdominal pain.  He has been passing flatus through his ostomy and stool like usual.  Surgery is consulted by Dr. Tamala Julian in this context for evaluation and management right ischial pressure ulcer requiring debridement with underlying osteomyelitis.  PAST MEDICAL HISTORY (PMH):  Past Medical History:  Diagnosis Date   Abdominal distention 10/11/2016   Acute lower UTI 12/22/2019   Acute sepsis ([place]) 04/21/2016   AKI (acute kidney injury) ([place]) 06/27/2020   Decubitus ulcer    Distal intestinal obstruction syndrome ([place]) 10/11/2016   Fecal impaction in rectum ([place]) 10/10/2016   Gastric outlet obstruction 06/03/2016   Gastritis    GERD (gastroesophageal reflux disease) 12/22/2019   HTN (hypertension) 06/27/2020   Hydronephrosis    Hyperlipidemia    Hypokalemia 10/11/2016   Impaction of the bowels (HCC)    Kidney stone    Left ureteral stone 06/27/2020   Leukocytosis 10/11/2016   Loose stools 10/11/2016   Nausea & vomiting 06/03/2016   Renal disorder    SBO (small bowel obstruction) ([place]) 11/02/2017   Sepsis due to gram-negative UTI ([place]) 03/08/2021   Septic shock ([place]) 03/08/2021   SIRS (systemic inflammatory response syndrome) ([name]) 06/03/2016   Spina bifida ([place])    Ulcer of esophagus with bleeding      PAST SURGICAL HISTORY ([place]):   Past Surgical History:  Procedure Laterality Date   ABDOMINAL SURGERY     APPENDECTOMY     BACK SURGERY     COLON SURGERY     CREATION / REVISION OF ILEOSTOMY / JEJUNOSTOMY  05/2015   CYSTOSCOPY WITH RETROGRADE PYELOGRAM, URETEROSCOPY AND STENT PLACEMENT  03/2014   ESOPHAGOGASTRODUODENOSCOPY (EGD) WITH PROPOFOL N/A 06/03/2016   Procedure: ESOPHAGOGASTRODUODENOSCOPY (EGD) WITH PROPOFOL;  Surgeon: [name], MD;  Location: [facility] ENDOSCOPY;  Service: Endoscopy;  Laterality: N/A;   ESOPHAGOGASTRODUODENOSCOPY (EGD) WITH PROPOFOL N/A 04/27/2019   Procedure: ESOPHAGOGASTRODUODENOSCOPY (EGD) WITH PROPOFOL;  Surgeon: Jonathon Bellows, MD;  Location: [facility] ENDOSCOPY;  Service: Gastroenterology;  Laterality: N/A;   ilieostomy     PERCUTANEOUS NEPHROSTOLITHOTOMY     PERCUTANEOUS NEPHROSTOMY     VENTRICULOPERITONEAL SHUNT     vp shunt removal       MEDICATIONS:  Prior to Admission medications   Medication Sig Start Date End Date Taking? Authorizing Provider  Cranberry-Vitamin C-Probiotic (AZO CRANBERRY PO) Take 1 tablet by mouth daily.   Yes [provider]  dicyclomine (BENTYL) 20 MG tablet Take 20 mg by mouth 2 (two) times daily.   Yes [provider]  hydrochlorothiazide (HYDRODIURIL) 25 MG tablet Take 25 mg by mouth daily. 06/19/20  Yes [provider]  Multiple Vitamins-Minerals (CENTRUM MEN PO) Take 1 tablet by mouth daily.    Yes [provider]  pantoprazole (PROTONIX) 40 MG tablet Take 1 tablet (40 mg total) by mouth daily. 04/29/19  Yes Sainani,  Belia Heman, MD  potassium chloride SA (KLOR-CON) 20 MEQ tablet Take 20 mEq by mouth daily.   Yes [provider]  rosuvastatin (CRESTOR) 10 MG tablet Take 10 mg by mouth at bedtime. 10/20/19  Yes [provider]  sucralfate (CARAFATE) 1 g tablet Take 1 g by mouth 3 (three) times daily. 12/14/19  Yes [provider]  traZODone (DESYREL) 50 MG tablet Take 50 mg by mouth at bedtime. 02/15/21  Yes  [provider]     ALLERGIES:  Allergies  Allergen Reactions   Morphine And Related Nausea And Vomiting   Sulfa Antibiotics Other (See Comments)    Reaction: unknown   Ivp Dye [Iodinated Diagnostic Agents] Hives   Latex Rash     SOCIAL HISTORY:  Social History   Socioeconomic History   Marital status: Single    Spouse name: Not on file   Number of children: Not on file   Years of education: Not on file   Highest education level: Not on file  Occupational History   Occupation: disabled  Tobacco Use   Smoking status: Never   Smokeless tobacco: Never  Substance and Sexual Activity   Alcohol use: No   Drug use: No   Sexual activity: Never  Other Topics Concern   Not on file  Social History Narrative   Not on file   Social Determinants of Health   Financial Resource Strain: Not on file  Food Insecurity: Not on file  Transportation Needs: Not on file  Physical Activity: Not on file  Stress: Not on file  Social Connections: Not on file  Intimate Partner Violence: Not on file     FAMILY HISTORY:  Family History  Problem Relation Age of Onset   Diabetes Mellitus II Father    Bladder Cancer Neg Hx    Kidney cancer Neg Hx    Prostate cancer Neg Hx       REVIEW OF SYSTEMS:  Review of Systems  Constitutional:  Negative for chills and fever.  Respiratory:  Negative for shortness of breath and wheezing.   Cardiovascular:  Negative for chest pain and palpitations.  Gastrointestinal:  Negative for abdominal pain, constipation, diarrhea, nausea and vomiting.  Genitourinary:  Negative for dysuria, frequency and urgency.  Musculoskeletal:  Negative for back pain and joint pain.  Skin:        Confirms pressure ulcer on right buttock  Neurological:  Negative for dizziness and headaches.  Psychiatric/Behavioral:  Negative for depression.    VITAL SIGNS:  Temp:  [98.2 F (36.8 C)] 98.2 F (36.8 C) (12/17 1009) Pulse Rate:  [101] 101 (12/17 1009) Resp:   [18] 18 (12/17 1009) BP: (102)/(67) 102/67 (12/17 1009) SpO2:  [92 %] 92 % (12/17 1009) Weight:  [54.9 kg] 54.9 kg (12/17 1010)     Height: 4\' 6"  (137.2 cm) Weight: 54.9 kg BMI (Calculated): 29.17   INTAKE/OUTPUT:  No intake/output data recorded.  PHYSICAL EXAM:  Physical Exam Constitutional:      Appearance: Normal appearance.  HENT:     Head: Normocephalic and atraumatic.  Eyes:     Extraocular Movements: Extraocular movements intact.     Pupils: Pupils are equal, round, and reactive to light.  Cardiovascular:     Rate and Rhythm: Normal rate and regular rhythm.  Pulmonary:     Effort: Pulmonary effort is normal.     Breath sounds: Normal breath sounds.  Abdominal:     Comments: Soft, nontender, nondistended with no rigidity, guarding, rebound  tenderness, he has a left-sided ostomy that is pink and patent, he has a right-sided urinary diversion  Skin:    General: Skin is warm and dry.     Comments: Unstageable right ischial decubitus ulcer, measuring 7 x 4.5 cm; 1 cm left ischial wound with healthy-appearing granulation tissue at base  Neurological:     Mental Status: He is alert.     Labs:  CBC Latest Ref Rng & Units 09/19/2021 03/10/2021 03/09/2021  WBC 4.0 - 10.5 K/uL 9.9 10.3 12.1(H)  Hemoglobin 13.0 - 17.0 g/dL 13.9 12.3(L) 10.4(L)  Hematocrit 39.0 - 52.0 % 44.3 38.8(L) 32.2(L)  Platelets 150 - 400 K/uL 472(H) 287 200   CMP Latest Ref Rng & Units 09/19/2021 03/11/2021 03/10/2021  Glucose 70 - 99 mg/dL 92 102(H) 101(H)  BUN 6 - 20 mg/dL 29(H) 16 20  Creatinine 0.61 - 1.24 mg/dL 1.47(H) 1.10 1.34(H)  Sodium 135 - 145 mmol/L 136 143 139  Potassium 3.5 - 5.1 mmol/L 2.6(LL) 3.5 2.8(L)  Chloride 98 - 111 mmol/L 95(L) 110 105  CO2 22 - 32 mmol/L 27 22 22   Calcium 8.9 - 10.3 mg/dL 9.0 8.6(L) 9.0  Total Protein 6.5 - 8.1 g/dL 8.4(H) - -  Total Bilirubin 0.3 - 1.2 mg/dL 1.0 - -  Alkaline Phos 38 - 126 U/L 81 - -  AST 15 - 41 U/L 29 - -  ALT 0 - 44 U/L 32 - -    Imaging  studies:  CT PELVIS WITHOUT CONTRAST IMPRESSION: 1. Right inferior buttock decubitus ulcer extends to the posteroinferior right ischium where there is evidence of osteomyelitis. 2. No evidence of an abscess. 3. No other acute abnormality of the pelvis. 4. Chronic findings including left hip changes as detailed, stable from the prior CT.  Assessment/Plan:  55 y.o. male with right ischial osteomyelitis, noted on CT, secondary to unstageable right ischial decubitus ulcer, complicated by pertinent comorbidities including spina bifida with paraplegia.   -Plan for bedside debridement of right ischial decubitus ulcer  -Verbal consent obtained from the patient to perform excisional debridement of right ischial decubitus ulcer.  Risk of the procedure were discussed, including risk of bleeding and infection.  Patient is agreeable to procedure.  All questions were answered.  -No concern for infection of sacral decubitus ulcer, no abscess cavities noted, only necrotic tissue  -Wound will require daily dressing changes with Santyl, damp 4 x 4, ABD pad, and paper tape  -Antibiotics per primary team for treatment of osteomyelitis  -As needed pain control  -Care per primary team  All of the above findings and recommendations were discussed with the patient, and all of patient's questions were answered to his expressed satisfaction.  Thank you for the opportunity to participate in this patient's care.   -- Graciella Freer, DO [facility] [address] [place], [place] [address] [phone] (office)

## 2021-09-19 NOTE — Procedures (Signed)
Bedside procedure note  Preoperative Diagnosis: Unstageable right ischial decubitus ulcer Postoperative Diagnosis: Stage IV right ischial decubitus ulcer   Procedure(s) Performed: Excisional debridement of right ischial decubitus ulcer   Performing provider: Barnetta Chapel Rahmir Beever, DO  Estimated Blood Loss: Minimal  Findings: Stage IV right ischial decubitus ulcer, no evidence of subcutaneous infection on debridement   Procedure: At bedside, right ischial wound was prepped with Betadine and draped with OR towels.  Verbal consent obtained from the patient prior to beginning of procedure.  Timeout was performed.  Using scalpel, necrotic tissue was removed from the sacral decubitus ulcer to leading tissue.  Surgicel was placed in the wound to help with hemostasis.  The wound was then packed with saline dampened Kerlix, covered with ABD and foam tape.  The final measurements of the wound were 7 cm x 4.5 cm x 2 cm.  Patient tolerated the procedure without issue.  Picture of postdebridement right ischial decubitus ulcer is in the media tab (patient consented to inclusion of this photo to his medical record).  -- Graciella Freer, DO

## 2021-09-19 NOTE — ED Notes (Signed)
IV team unable to obtain IV access, pt refusing to be stuck again at this time.

## 2021-09-19 NOTE — Consult Note (Signed)
Pharmacy Antibiotic Note  [name] is a 55 y.o. male with medical history including spina bifida, neurogenic bladder, chronic bilateral hydronephrosis, HTN, HLD, CKD stage III, AAA, urinary tract infections, history of MRSA admitted on 09/19/2021 with  infected decubitus ulcers / sacral osteomyelitis .  Pharmacy has been consulted for vancomycin and cefepime dosing. Patient is also ordered metronidazole.   Plan:  Cefepime 2 g IV q12h  Vancomycin 1 g IV LD followed by maintenance regimen of vancomycin 500 mg IV q24h --Calculated AUC: 439, Cmin 12.9 --Daily Scr per protocol --Levels at steady state as clinically indicated  Height: 4\' 6"  (137.2 cm) Weight: 54.9 kg (121 lb 0.5 oz) IBW/kg (Calculated) : 36.2  Temp (24hrs), Avg:98.2 F (36.8 C), Min:98.2 F (36.8 C), Max:98.2 F (36.8 C)  Recent Labs  Lab 09/19/21 1011  WBC 9.9  CREATININE 1.47*  LATICACIDVEN 1.3    Estimated Creatinine Clearance: 35.5 mL/min (A) (by C-G formula based on SCr of 1.47 mg/dL (H)).    Allergies  Allergen Reactions   Morphine And Related Nausea And Vomiting   Sulfa Antibiotics Other (See Comments)    Reaction: unknown   Ivp Dye [Iodinated Diagnostic Agents] Hives   Latex Rash    Antimicrobials this admission: Vancomycin 12/17 >>  Cefepime 12/17 >>  Metronidazole 12/17 >>   Dose adjustments this admission: N/A  Microbiology results: 12/17 BCx: pending 12/17 UCx: pending   Thank you for allowing pharmacy to be a part of this patients care.  [name] 09/19/2021 2:09 PM

## 2021-09-19 NOTE — ED Provider Notes (Signed)
[facility] Emergency Department Provider Note  ____________________________________________   Event Date/Time   First MD Initiated Contact with Patient 09/19/21 1024     (approximate)  I have reviewed the triage vital signs and the nursing notes.   HISTORY  Chief Complaint Wound Check   HPI [name] is a 55 y.o. male with history of spina bifida, neurogenic bladder, chronic bilateral hydronephrosis, known bilateral nephrolithiasis, hypertension, hyperlipidemia, CKD stage III AAA previous urinary tract infections and sepsis who presents for assessment of approximately 3 days of feeling bad with subjective fevers and some soreness in his right buttocks and gluteal region.  Patient states he currently is living at home with family and does not have home health tomorrow to keep pressure off sacrum.  He states his urine has also been darker than usual and is concerned he may have developed a urinary tract infection.  He denies any headache, earache, sore throat, chest pain, cough, shortness of breath, back pain rash or extremity pain.  No other acute concerns at this time.         Past Medical History:  Diagnosis Date   Abdominal distention 10/11/2016   Acute lower UTI 12/22/2019   Acute sepsis ([place]) 04/21/2016   AKI (acute kidney injury) ([place]) 06/27/2020   Decubitus ulcer    Distal intestinal obstruction syndrome ([place]) 10/11/2016   Fecal impaction in rectum ([place]) 10/10/2016   Gastric outlet obstruction 06/03/2016   Gastritis    GERD (gastroesophageal reflux disease) 12/22/2019   HTN (hypertension) 06/27/2020   Hydronephrosis    Hyperlipidemia    Hypokalemia 10/11/2016   Impaction of the bowels (HCC)    Kidney stone    Left ureteral stone 06/27/2020   Leukocytosis 10/11/2016   Loose stools 10/11/2016   Nausea & vomiting 06/03/2016   Renal disorder    SBO (small bowel obstruction) ([place]) 11/02/2017   Sepsis due to gram-negative UTI ([place]) 03/08/2021   Septic shock  ([place]) 03/08/2021   SIRS (systemic inflammatory response syndrome) ([place]) 06/03/2016   Spina bifida ([place])    Ulcer of esophagus with bleeding     Patient Active Problem List   Diagnosis Date Noted   Sacral osteomyelitis ([place]) 09/19/2021   Sepsis due to gram-negative UTI ([place]) 03/08/2021   Septic shock ([place]) 03/08/2021   Left ureteral stone 06/27/2020   Acute UTI 06/27/2020   HTN (hypertension) 06/27/2020   AKI (acute kidney injury) ([place]) 06/27/2020   Spina bifida ([place])    Hydronephrosis    Kidney stone    Hyperlipidemia    Hiccups    Acute lower UTI 12/22/2019   GERD (gastroesophageal reflux disease) 12/22/2019   SBO (small bowel obstruction) ([place]) 11/02/2017   Abdominal distention 10/11/2016   Distal intestinal obstruction syndrome ([place]) 10/11/2016   Hypokalemia 10/11/2016   Leukocytosis 10/11/2016   Loose stools 10/11/2016   Fecal impaction in rectum ([place]) 10/10/2016   Nausea & vomiting 06/03/2016   Gastric outlet obstruction 06/03/2016   SIRS (systemic inflammatory response syndrome) ([place]) 06/03/2016   Pressure ulcer 06/03/2016   Impaction of the bowels (HCC)    Gastritis    Ulcer of esophagus with bleeding    Nausea    Nausea vomiting and diarrhea    Acute sepsis ([place]) 04/21/2016    Past Surgical History:  Procedure Laterality Date   ABDOMINAL SURGERY     APPENDECTOMY     BACK SURGERY     COLON SURGERY     CREATION / REVISION OF ILEOSTOMY /  JEJUNOSTOMY  05/2015   CYSTOSCOPY WITH RETROGRADE PYELOGRAM, URETEROSCOPY AND STENT PLACEMENT  03/2014   ESOPHAGOGASTRODUODENOSCOPY (EGD) WITH PROPOFOL N/A 06/03/2016   Procedure: ESOPHAGOGASTRODUODENOSCOPY (EGD) WITH PROPOFOL;  Surgeon: [name], MD;  Location: [facility] ENDOSCOPY;  Service: Endoscopy;  Laterality: N/A;   ESOPHAGOGASTRODUODENOSCOPY (EGD) WITH PROPOFOL N/A 04/27/2019   Procedure: ESOPHAGOGASTRODUODENOSCOPY (EGD) WITH PROPOFOL;  Surgeon: Jonathon Bellows, MD;  Location: [facility] ENDOSCOPY;  Service: Gastroenterology;   Laterality: N/A;   ilieostomy     PERCUTANEOUS NEPHROSTOLITHOTOMY     PERCUTANEOUS NEPHROSTOMY     VENTRICULOPERITONEAL SHUNT     vp shunt removal      Prior to Admission medications   Medication Sig Start Date End Date Taking? Authorizing Provider  Cranberry-Vitamin C-Probiotic (AZO CRANBERRY PO) Take 1 tablet by mouth daily.    [provider]  dicyclomine (BENTYL) 10 MG capsule Take 10 mg by mouth 3 (three) times daily. 02/28/20   [provider]  hydrochlorothiazide (HYDRODIURIL) 25 MG tablet Take 25 mg by mouth daily. 06/19/20   [provider]  Multiple Vitamins-Minerals (CENTRUM MEN PO) Take 1 tablet by mouth daily.     [provider]  pantoprazole (PROTONIX) 40 MG tablet Take 1 tablet (40 mg total) by mouth daily. 04/29/19 06/27/20  Henreitta Leber, MD  potassium chloride SA (KLOR-CON) 20 MEQ tablet Take 20 mEq by mouth daily. Patient not taking: Reported on 04/09/2021 05/28/20   [provider]  rosuvastatin (CRESTOR) 10 MG tablet Take 10 mg by mouth at bedtime. 10/20/19   [provider]  sucralfate (CARAFATE) 1 g tablet Take 1 g by mouth 3 (three) times daily. 12/14/19   [provider]  traZODone (DESYREL) 50 MG tablet Take 50 mg by mouth at bedtime. 02/15/21   [provider]    Allergies Morphine and related, Sulfa antibiotics, Ivp dye [iodinated diagnostic agents], and Latex  Family History  Problem Relation Age of Onset   Diabetes Mellitus II Father    Bladder Cancer Neg Hx    Kidney cancer Neg Hx    Prostate cancer Neg Hx     Social History Social History   Tobacco Use   Smoking status: Never   Smokeless tobacco: Never  Substance Use Topics   Alcohol use: No   Drug use: No    Review of Systems  Review of Systems  Constitutional:  Negative for chills and fever.  HENT:  Negative for sore throat.   Eyes:  Negative for pain.  Respiratory:  Negative for cough and stridor.   Cardiovascular:   Negative for chest pain.  Gastrointestinal:  Positive for abdominal pain (lower abdomen). Negative for vomiting.  Musculoskeletal:  Positive for myalgias (gluteal region).  Skin:  Negative for rash.  Neurological:  Negative for seizures, loss of consciousness and headaches.  Psychiatric/Behavioral:  Negative for suicidal ideas.   All other systems reviewed and are negative.    ____________________________________________   PHYSICAL EXAM:  VITAL SIGNS: ED Triage Vitals  Enc Vitals Group     BP 09/19/21 1009 102/67     Pulse Rate 09/19/21 1009 (!) 101     Resp 09/19/21 1009 18     Temp 09/19/21 1009 98.2 F (36.8 C)     Temp Source 09/19/21 1009 Axillary     SpO2 09/19/21 1009 92 %     Weight 09/19/21 1010 121 lb 0.5 oz (54.9 kg)     Height 09/19/21 1010 '4\' 6"'  (1.372 m)     Head Circumference --  Peak Flow --      Pain Score 09/19/21 1009 0     Pain Loc --      Pain Edu? --      Excl. in [place]? --    Vitals:   09/19/21 1009  BP: 102/67  Pulse: (!) 101  Resp: 18  Temp: 98.2 F (36.8 C)  SpO2: 92%   Physical Exam Vitals and nursing note reviewed.  Constitutional:      General: He is not in acute distress.    Appearance: He is well-developed.  HENT:     Head: Normocephalic and atraumatic.     Right Ear: External ear normal.     Left Ear: External ear normal.     Nose: Nose normal.  Eyes:     Conjunctiva/sclera: Conjunctivae normal.  Cardiovascular:     Rate and Rhythm: Normal rate and regular rhythm.     Heart sounds: No murmur heard. Pulmonary:     Effort: Pulmonary effort is normal. No respiratory distress.     Breath sounds: Normal breath sounds.  Abdominal:     Palpations: Abdomen is soft.     Tenderness: There is no abdominal tenderness.  Musculoskeletal:        General: No swelling.     Cervical back: Neck supple.  Skin:    General: Skin is warm and dry.     Capillary Refill: Capillary refill takes less than 2 seconds.  Neurological:     Mental  Status: He is alert.  Psychiatric:        Mood and Affect: Mood normal.    Over the right superior posterior leg at the inferior aspect of the gluteus there is a oval-shaped ulcerated lesion with some necrotic appearing tissue and slight purulence.  There is also a sacral wound that is fairly red and erythematous without purulent drainage or bleeding.  This does not probe to bone.  Colostomy and ____________________________________________   LABS (all labs ordered are listed, but only abnormal results are displayed)  Labs Reviewed  COMPREHENSIVE METABOLIC PANEL - Abnormal; Notable for the following components:      Result Value   Potassium 2.6 (*)    Chloride 95 (*)    BUN 29 (*)    Creatinine, Ser 1.47 (*)    Total Protein 8.4 (*)    Albumin 3.3 (*)    GFR, Estimated 56 (*)    All other components within normal limits  CBC WITH DIFFERENTIAL/PLATELET - Abnormal; Notable for the following components:   Platelets 472 (*)    Neutro Abs 7.8 (*)    All other components within normal limits  SEDIMENTATION RATE - Abnormal; Notable for the following components:   Sed Rate 32 (*)    All other components within normal limits  MAGNESIUM - Abnormal; Notable for the following components:   Magnesium 2.7 (*)    All other components within normal limits  URINE CULTURE  RESP PANEL BY RT-PCR (FLU A&B, COVID) ARPGX2  CULTURE, BLOOD (ROUTINE X 2)  CULTURE, BLOOD (ROUTINE X 2)  LACTIC ACID, PLASMA  LACTIC ACID, PLASMA  URINALYSIS, ROUTINE W REFLEX MICROSCOPIC   ____________________________________________  EKG   ____________________________________________  RADIOLOGY  ED MD interpretation: CT abdomen pelvis shows right inferior buttocks decubitus ulcer extending to the posterior inferior ischium with evidence of osteomyelitis.  There is no abscess or other acute pelvic process.  Official radiology report(s): CT PELVIS WO CONTRAST  Result Date: 09/19/2021 CLINICAL DATA:  Soft  tissue infection  suspected. [place] to the gluteal region with associated pain. EXAM: CT PELVIS WITHOUT CONTRAST TECHNIQUE: Multidetector CT imaging of the pelvis was performed following the standard protocol without intravenous contrast. COMPARISON:  CT, 03/07/2021. FINDINGS: Urinary Tract: Right lower quadrant ileostomy. Visualized ureters converge on an ileo conduit anterior to the pelvic brim. Visualized ureters are normal in caliber. Inferior aspects of the kidneys show calcifications similar to the prior CT. Bladder is mostly decompressed. Bowel: No evidence of bowel obstruction or inflammation. Descending colon extends into a left lower quadrant colostomy, stable. Vascular/Lymphatic: No significant vascular abnormality. No enlarged lymph nodes. Reproductive:  Enlarged prostate, stable. Other:  No free fluid in the visualized abdomen or pelvis. Musculoskeletal: Decubitus ulcer tracks from the inferior right buttock to the posteroinferior ischium where there is bone resorption consistent with osteomyelitis. Ulceration shows surrounding inflammation, but no defined collection to suggest an abscess. This is new compared to the prior CT. Soft tissue crease extends from the inferior left buttock to the posteroinferior left ischium with associated bony reactive changes. There are advanced arthropathic changes of the left hip that appears due to dysplasia. The acetabula show all, femoral head flattened/remodeled, with surrounding synovial thickening and a joint effusion, all findings stable compared to the prior CT. IMPRESSION: 1. Right inferior buttock decubitus ulcer extends to the posteroinferior right ischium where there is evidence of osteomyelitis. 2. No evidence of an abscess. 3. No other acute abnormality of the pelvis. 4. Chronic findings including left hip changes as detailed, stable from the prior CT. Electronically Signed   By: Lajean Manes M.D.   On: 09/19/2021 12:22     ____________________________________________   PROCEDURES  Procedure(s) performed (including Critical Care):  Procedures   ____________________________________________   INITIAL IMPRESSION / ASSESSMENT AND PLAN / ED COURSE        Patient presents with above-stated history and exam with concerns that he could have infected sacral wound and possible UTI.  States he has had issues with chronic UTI and has had trouble keeping weight off of his sacrum is when he tries to turn at home in a chair his bag leaks.  On arrival he is tachycardic at 101 with stable vital signs on room air.  Primary differential includes cellulitis or infected sacral decubitus wound versus deeper infection possibly osteomyelitis versus deeper abscess.  Patient is also worried his urine is infected UA will be somewhat not helpful given patient has chronic Foley on his ileal conduit diversion.   CMP remarkable for K of 2.6 and a creatinine of 1.47 compared to 1.16 months ago without other significant electrolyte or metabolic derangements.  CBC without leukocytosis or acute anemia.  ESR is 32.  Magnesium is 2.7.  CT abdomen pelvis shows right inferior buttocks decubitus ulcer extending to the posterior inferior ischium with evidence of osteomyelitis.  There is no abscess or other acute pelvic process.  Given concern for osteomyelitis will obtain blood cultures and start patient on broad-spectrum antibiotics.  Given concern for some necrotic tissue overlying the wound.  Consult general surgery spoke with Dr.   Derryl Harbor  Who will evaluate the patient for possible debridement later today medically with medicine admission for antibiotics for the osteomyelitis.  I will admit to medicine service for further evaluation and management.      ____________________________________________   FINAL CLINICAL IMPRESSION(S) / ED DIAGNOSES  Final diagnoses:  Osteomyelitis of other site, unspecified type ([place])  AKI (acute  kidney injury) ([place])  Hypokalemia    Medications  potassium  chloride SA (KLOR-CON M) CR tablet 40 mEq (has no administration in time range)  lactated ringers bolus 1,000 mL (has no administration in time range)  vancomycin (VANCOCIN) IVPB 1000 mg/200 mL premix (has no administration in time range)  ceFEPIme (MAXIPIME) 2 g in sodium chloride 0.9 % 100 mL IVPB (has no administration in time range)     ED Discharge Orders     None        Note:  This document was prepared using Dragon voice recognition software and may include unintentional dictation errors.    Lucrezia Starch, MD 09/19/21 [id number]

## 2021-09-19 NOTE — H&P (Addendum)
History and Physical    [name] VQX:[id number] DOB: 05-10-1966 DOA: 09/19/2021  Referring MD/NP/PA:   PCP: Idelle Crouch, MD   Patient coming from:  The patient is coming from home.  At baseline, pt is independent for most of ADL.        Chief Complaint: worsening pressure ulcer  HPI: Keshan Reha is a 55 y.o. male with medical history significant of spina bifida, neurogenic bladder requiring ileal conduit urinary diversion, chronic bilateral hydronephrosis and bilateral nephrolithiasis status post PCNL, s/of colostomy, hypertension, hyperlipidemia, GERD, CKD-3, esophageal ulcer with bleeding, who presents with worsening pressure ulcer.  Pt states that he has chronic pressure ulcer and wound in right gluteal area, which has been worsening in the past several days. He states that when he turns to get pressure off the area, his urine bag leaks and causes other problems.  Patient does not have pain.  He has subjective fever and chills at home.  His body temperature is 98.2 in ED. He states his urine has also been darker than usual and is concerned he may have developed urinary tract infection.  Denies chest pain, cough, shortness of breath.  No nausea vomiting, diarrhea or abdominal pain.  ED Course: pt was found to have WBC 9.9, lactic acid of 1.3, pending COVID PCR, slightly worsening renal function, temperature normal, blood pressure 102/67, heart rate 101, RR 18, oxygen saturation 92-94% on room air.  Patient is admitted to [facility] bed as inpatient.  Dr. Okey Dupre of general surgery is consulted  CT abdomen/pelvis: 1. Right inferior buttock decubitus ulcer extends to the posteroinferior right ischium where there is evidence of osteomyelitis. 2. No evidence of an abscess. 3. No other acute abnormality of the pelvis. 4. Chronic findings including left hip changes as detailed, stable from the prior CT.  Review of Systems:   General: no fevers, chills, no body weight  gain, fatigue HEENT: no blurry vision, hearing changes or sore throat Respiratory: no dyspnea, coughing, wheezing CV: no chest pain, no palpitations GI: no nausea, vomiting, abdominal pain, diarrhea, constipation.  GU: no dysuria, burning on urination, increased urinary frequency, hematuria  Ext: no leg edema Neuro: no unilateral weakness, numbness, or tingling, no vision change or hearing loss Skin: has pressure ulcer and wound in right gluteal area. MSK: No muscle spasm, no deformity, no limitation of range of movement in spin Heme: No easy bruising.  Travel history: No recent long distant travel.  Allergy:  Allergies  Allergen Reactions   Morphine And Related Nausea And Vomiting   Sulfa Antibiotics Other (See Comments)    Reaction: unknown   Ivp Dye [Iodinated Diagnostic Agents] Hives   Latex Rash    Past Medical History:  Diagnosis Date   Abdominal distention 10/11/2016   Acute lower UTI 12/22/2019   Acute sepsis ([place]) 04/21/2016   AKI (acute kidney injury) ([place]) 06/27/2020   Decubitus ulcer    Distal intestinal obstruction syndrome ([place]) 10/11/2016   Fecal impaction in rectum ([place]) 10/10/2016   Gastric outlet obstruction 06/03/2016   Gastritis    GERD (gastroesophageal reflux disease) 12/22/2019   HTN (hypertension) 06/27/2020   Hydronephrosis    Hyperlipidemia    Hypokalemia 10/11/2016   Impaction of the bowels ([place])    Kidney stone    Left ureteral stone 06/27/2020   Leukocytosis 10/11/2016   Loose stools 10/11/2016   Nausea & vomiting 06/03/2016   Renal disorder    SBO (small bowel obstruction) ([place]) 11/02/2017   Sepsis  due to gram-negative UTI ([place]) 03/08/2021   Septic shock ([place]) 03/08/2021   SIRS (systemic inflammatory response syndrome) ([place]) 06/03/2016   Spina bifida ([place])    Ulcer of esophagus with bleeding     Past Surgical History:  Procedure Laterality Date   ABDOMINAL SURGERY     APPENDECTOMY     BACK SURGERY     COLON SURGERY     CREATION / REVISION OF ILEOSTOMY /  JEJUNOSTOMY  05/2015   CYSTOSCOPY WITH RETROGRADE PYELOGRAM, URETEROSCOPY AND STENT PLACEMENT  03/2014   ESOPHAGOGASTRODUODENOSCOPY (EGD) WITH PROPOFOL N/A 06/03/2016   Procedure: ESOPHAGOGASTRODUODENOSCOPY (EGD) WITH PROPOFOL;  Surgeon: [name], MD;  Location: [facility] ENDOSCOPY;  Service: Endoscopy;  Laterality: N/A;   ESOPHAGOGASTRODUODENOSCOPY (EGD) WITH PROPOFOL N/A 04/27/2019   Procedure: ESOPHAGOGASTRODUODENOSCOPY (EGD) WITH PROPOFOL;  Surgeon: Jonathon Bellows, MD;  Location: [facility] ENDOSCOPY;  Service: Gastroenterology;  Laterality: N/A;   ilieostomy     PERCUTANEOUS NEPHROSTOLITHOTOMY     PERCUTANEOUS NEPHROSTOMY     VENTRICULOPERITONEAL SHUNT     vp shunt removal      Social History:  reports that he has never smoked. He has never used smokeless tobacco. He reports that he does not drink alcohol and does not use drugs.  Family History:  Family History  Problem Relation Age of Onset   Diabetes Mellitus II Father    Bladder Cancer Neg Hx    Kidney cancer Neg Hx    Prostate cancer Neg Hx      Prior to Admission medications   Medication Sig Start Date End Date Taking? Authorizing Provider  Cranberry-Vitamin C-Probiotic (AZO CRANBERRY PO) Take 1 tablet by mouth daily.    [provider]  dicyclomine (BENTYL) 10 MG capsule Take 10 mg by mouth 3 (three) times daily. 02/28/20   [provider]  hydrochlorothiazide (HYDRODIURIL) 25 MG tablet Take 25 mg by mouth daily. 06/19/20   [provider]  Multiple Vitamins-Minerals (CENTRUM MEN PO) Take 1 tablet by mouth daily.     [provider]  pantoprazole (PROTONIX) 40 MG tablet Take 1 tablet (40 mg total) by mouth daily. 04/29/19 06/27/20  Henreitta Leber, MD  potassium chloride SA (KLOR-CON) 20 MEQ tablet Take 20 mEq by mouth daily. Patient not taking: Reported on 04/09/2021 05/28/20   [provider]  rosuvastatin (CRESTOR) 10 MG tablet Take 10 mg by mouth at bedtime. 10/20/19   [provider]  sucralfate (CARAFATE) 1 g tablet Take 1 g by mouth 3 (three) times daily. 12/14/19   [provider]  traZODone (DESYREL) 50 MG tablet Take 50 mg by mouth at bedtime. 02/15/21   [provider]    Physical Exam: Vitals:   09/19/21 1009 09/19/21 1010  BP: 102/67   Pulse: (!) 101   Resp: 18   Temp: 98.2 F (36.8 C)   TempSrc: Axillary   SpO2: 92%   Weight:  54.9 kg  Height:  4' 6" (1.372 m)   General: Not in acute distress HEENT:       Eyes: PERRL, EOMI, no scleral icterus.       ENT: No discharge from the ears and nose, no pharynx injection, no tonsillar enlargement.        Neck: No JVD, no bruit, no mass felt. Heme: No neck lymph node enlargement. Cardiac: S1/S2, RRR, No murmurs, No gallops or rubs. Respiratory: No rales, wheezing, rhonchi or rubs. GI: Soft, nondistended, nontender, no rebound pain, no organomegaly, BS present. s/p of urostomy and  colostomy.GU: No hematuria Ext: No pitting leg edema bilaterally. 1+DP/PT pulse bilaterally. Musculoskeletal: No joint deformities, No joint redness or warmth, no limitation of ROM in spin. Skin: No rashes. Has an wound in right hip area with surrounding erythema, with little pus on the surface      Neuro: Alert, oriented X3, cranial nerves II-XII grossly intact. Has paraplegia. Psych: Patient is not psychotic, no suicidal or hemocidal ideation.  Labs on Admission: I have personally reviewed following labs and imaging studies  CBC: Recent Labs  Lab 09/19/21 1011  WBC 9.9  NEUTROABS 7.8*  HGB 13.9  HCT 44.3  MCV 85.0  PLT 242*   Basic Metabolic Panel: Recent Labs  Lab 09/19/21 1011  NA 136  K 2.6*  CL 95*  CO2 27  GLUCOSE 92  BUN 29*  CREATININE 1.47*  CALCIUM 9.0  MG 2.7*   GFR: Estimated Creatinine Clearance: 35.5 mL/min (A) (by C-G formula based on SCr of 1.47 mg/dL (H)). Liver Function Tests: Recent Labs  Lab 09/19/21 1011  AST 29  ALT 32  ALKPHOS 81  BILITOT 1.0  PROT 8.4*   ALBUMIN 3.3*   No results for input(s): LIPASE, AMYLASE in the last 168 hours. No results for input(s): AMMONIA in the last 168 hours. Coagulation Profile: No results for input(s): INR, PROTIME in the last 168 hours. Cardiac Enzymes: No results for input(s): CKTOTAL, CKMB, CKMBINDEX, TROPONINI in the last 168 hours. BNP (last 3 results) No results for input(s): PROBNP in the last 8760 hours. HbA1C: No results for input(s): HGBA1C in the last 72 hours. CBG: No results for input(s): GLUCAP in the last 168 hours. Lipid Profile: No results for input(s): CHOL, HDL, LDLCALC, TRIG, CHOLHDL, LDLDIRECT in the last 72 hours. Thyroid Function Tests: No results for input(s): TSH, T4TOTAL, FREET4, T3FREE, THYROIDAB in the last 72 hours. Anemia Panel: No results for input(s): VITAMINB12, FOLATE, FERRITIN, TIBC, IRON, RETICCTPCT in the last 72 hours. Urine analysis:    Component Value Date/Time   COLORURINE YELLOW (A) 03/07/2021 2239   APPEARANCEUR CLOUDY (A) 03/07/2021 2239   APPEARANCEUR Cloudy (A) 03/21/2019 1130   LABSPEC 1.008 03/07/2021 2239   PHURINE 8.0 03/07/2021 2239   GLUCOSEU NEGATIVE 03/07/2021 2239   HGBUR LARGE (A) 03/07/2021 2239   BILIRUBINUR NEGATIVE 03/07/2021 2239   BILIRUBINUR Negative 03/21/2019 1130   KETONESUR 5 (A) 03/07/2021 2239   PROTEINUR 100 (A) 03/07/2021 2239   NITRITE POSITIVE (A) 03/07/2021 2239   LEUKOCYTESUR LARGE (A) 03/07/2021 2239   Sepsis Labs: _0 (procalcitonin:4,lacticidven:4) )No results found for this or any previous visit (from the past 240 hour(s)).   Radiological Exams on Admission: CT PELVIS WO CONTRAST  Result Date: 09/19/2021 CLINICAL DATA:  Soft tissue infection suspected. Bedsore to the gluteal region with associated pain. EXAM: CT PELVIS WITHOUT CONTRAST TECHNIQUE: Multidetector CT imaging of the pelvis was performed following the standard protocol without intravenous contrast. COMPARISON:  CT, 03/07/2021. FINDINGS: Urinary  Tract: Right lower quadrant ileostomy. Visualized ureters converge on an ileo conduit anterior to the pelvic brim. Visualized ureters are normal in caliber. Inferior aspects of the kidneys show calcifications similar to the prior CT. Bladder is mostly decompressed. Bowel: No evidence of bowel obstruction or inflammation. Descending colon extends into a left lower quadrant colostomy, stable. Vascular/Lymphatic: No significant vascular abnormality. No enlarged lymph nodes. Reproductive:  Enlarged prostate, stable. Other:  No free fluid in the visualized abdomen or pelvis. Musculoskeletal: Decubitus ulcer tracks from the inferior right buttock to the posteroinferior ischium where  there is bone resorption consistent with osteomyelitis. Ulceration shows surrounding inflammation, but no defined collection to suggest an abscess. This is new compared to the prior CT. Soft tissue crease extends from the inferior left buttock to the posteroinferior left ischium with associated bony reactive changes. There are advanced arthropathic changes of the left hip that appears due to dysplasia. The acetabula show all, femoral head flattened/remodeled, with surrounding synovial thickening and a joint effusion, all findings stable compared to the prior CT. IMPRESSION: 1. Right inferior buttock decubitus ulcer extends to the posteroinferior right ischium where there is evidence of osteomyelitis. 2. No evidence of an abscess. 3. No other acute abnormality of the pelvis. 4. Chronic findings including left hip changes as detailed, stable from the prior CT. Electronically Signed   By: Lajean Manes M.D.   On: 09/19/2021 12:22     EKG: I have personally reviewed.    Assessment/Plan Principal Problem:   Osteomyelitis of right hip (HCC) Active Problems:   Hypokalemia   GERD (gastroesophageal reflux disease)   Hyperlipidemia   HTN (hypertension)   Spina bifida ([place])   CKD (chronic kidney disease), stage IIIa   Osteomyelitis of  right hip [facility]): CT showed right inferior buttock decubitus ulcer extends to the posteroinferior right ischium where there is evidence of osteomyelitis.  Patient is tachycardia, but no fever or leukocytosis.  Lactic acid is normal.  Clinically does not seem to have sepsis.  Dr. Okey Dupre of general surgery is consulted for possible surgical debridement.  - Admitted to MedSurg bed as inpatient - Empiric antimicrobial treatment with vancomycin Flagyl, cefepime - Blood cultures x 2  - ESR and CRP - wound care consult - IVF: 1.0 L of LR in ED  Hypokalemia: Potassium 2.6.  Magnesium 2.7 -Repleted potassium  GERD (gastroesophageal reflux disease) -Protonix  Hyperlipidemia -Crestor  HTN (hypertension) -IV hydralazine as needed -hold HCTZ due to worsending renal Fx.  Spina bifida [facility]): -fall precaution  CKD (chronic kidney disease), stage IIIa: Slightly worsening than baseline.  Recent Baseline creatinine 1.1-1.3.  His creatinine is 1.47, BUN 29 -Hold HCTZ -IV fluid as above         DVT ppx: SQ Heparin  Code Status: Full code Family Communication: not done, no family member is at bed side.   Disposition Plan:  Anticipate discharge back to previous environment Consults called:  Dr. Okey Dupre of general surgery is consulted Admission status and Level of care: Med-Surg:    as inpt         Status is: Inpatient  Remains inpatient appropriate because: Patient has multiple comorbidities, now presents with a sacral osteomyelitis.  Patient also has hypokalemia.  Patient likely needs surgical debridement.  His presentation is highly complicated. Patient is at high risk of developing sepsis.  Will need to be treated in hospital for at least 2 days.          Date of Service 09/19/2021    Ivor Costa [facility]   If 7PM-7AM, please contact night-coverage [email or web address] 09/19/2021, 3:03 PM

## 2021-09-19 NOTE — ED Triage Notes (Signed)
Pt in via EMS from home with c/o bed sore on right sacrum, gluteus and leaking urine bag. No fevers

## 2021-09-19 NOTE — Consult Note (Signed)
PHARMACY -  BRIEF ANTIBIOTIC NOTE   Pharmacy has received consult(s) for vancomycin and cefepime from an ED provider.  The patient's profile has been reviewed for ht/wt/allergies/indication/available labs.    One time order(s) placed for  --Vancomycin 1 g IV --Cefepime 2 g IV  Further antibiotics/pharmacy consults should be ordered by admitting physician if indicated.                       Thank you, Benita Gutter 09/19/2021  1:48 PM

## 2021-09-19 NOTE — ED Triage Notes (Signed)
Pt comes into the ED via [facility] from home c/o bedsore on his bottom that he is having pain with.  Pt states that he isnt able to turn because every time he turns to get pressure off the area, his urine bag leaks and causes other problems.  Pt states he has had the wound for a while but in the past 3 days or so it has had an increased odor and possible infection.  Pt in NAD at this time with even and unlabored respirations.

## 2021-09-20 ENCOUNTER — Inpatient Hospital Stay: Payer: Self-pay

## 2021-09-20 LAB — CBC
HCT: 45.4 % (ref 39.0–52.0)
Hemoglobin: 14.3 g/dL (ref 13.0–17.0)
MCH: 26.7 pg (ref 26.0–34.0)
MCHC: 31.5 g/dL (ref 30.0–36.0)
MCV: 84.7 fL (ref 80.0–100.0)
Platelets: 523 10*3/uL — ABNORMAL HIGH (ref 150–400)
RBC: 5.36 MIL/uL (ref 4.22–5.81)
RDW: 14.5 % (ref 11.5–15.5)
WBC: 7.7 10*3/uL (ref 4.0–10.5)
nRBC: 0 % (ref 0.0–0.2)

## 2021-09-20 LAB — BASIC METABOLIC PANEL
Anion gap: 16 — ABNORMAL HIGH (ref 5–15)
BUN: 35 mg/dL — ABNORMAL HIGH (ref 6–20)
CO2: 23 mmol/L (ref 22–32)
Calcium: 9.4 mg/dL (ref 8.9–10.3)
Chloride: 99 mmol/L (ref 98–111)
Creatinine, Ser: 1.52 mg/dL — ABNORMAL HIGH (ref 0.61–1.24)
GFR, Estimated: 54 mL/min — ABNORMAL LOW (ref >60)
Glucose, Bld: 73 mg/dL (ref 70–99)
Potassium: 3.5 mmol/L (ref 3.5–5.1)
Sodium: 138 mmol/L (ref 135–145)

## 2021-09-20 LAB — C-REACTIVE PROTEIN: CRP: 21.5 mg/dL — ABNORMAL HIGH (ref <1.0)

## 2021-09-20 LAB — HIV ANTIBODY (ROUTINE TESTING W REFLEX): HIV Screen 4th Generation wRfx: NONREACTIVE

## 2021-09-20 MED ORDER — SODIUM CHLORIDE 0.9% FLUSH: 10.0000 mL | INTRAVENOUS | Status: DC | PRN

## 2021-09-20 MED ORDER — VANCOMYCIN HCL 1250 MG/250ML IV SOLN
1250.0000 mg | Freq: Once | INTRAVENOUS | Status: AC
  Administered 2021-09-20: 14:00:00 1250 mg via INTRAVENOUS
  Filled 2021-09-20: qty 250

## 2021-09-20 MED ORDER — SODIUM CHLORIDE 0.9% FLUSH
10.0000 mL | Freq: Two times a day (BID) | INTRAVENOUS | Status: DC
  Administered 2021-09-20 – 2021-09-23 (×7): 10 mL

## 2021-09-20 MED ORDER — VANCOMYCIN VARIABLE DOSE PER UNSTABLE RENAL FUNCTION (PHARMACIST DOSING): Status: DC

## 2021-09-20 MED ORDER — CHLORHEXIDINE GLUCONATE CLOTH 2 % EX PADS
6.0000 | MEDICATED_PAD | Freq: Every day | CUTANEOUS | Status: DC
  Administered 2021-09-20 – 2021-09-23 (×4): 6 via TOPICAL
  Filled 2021-09-20: qty 6

## 2021-09-20 MED ORDER — COLLAGENASE 250 UNIT/GM EX OINT
TOPICAL_OINTMENT | Freq: Every day | CUTANEOUS | Status: DC
  Administered 2021-09-20: 1 via TOPICAL
  Filled 2021-09-20: qty 30

## 2021-09-20 MED ORDER — SODIUM CHLORIDE 0.9 % IV SOLN: INTRAVENOUS | Status: DC | PRN

## 2021-09-20 NOTE — Progress Notes (Signed)
[facility] Hospital Day(s): 1.   Interval History: Patient seen and examined, no acute events or new complaints overnight.  Patient has no complaints at this time.  He is tolerating his diet without nausea and vomiting.  He has no complaints related to his right ischial wound.  Review of Systems:  Constitutional: denies fever, chills  HEENT: denies cough or congestion  Respiratory: denies any shortness of breath  Cardiovascular: denies chest pain or palpitations  Gastrointestinal: denies abdominal pain, N/V Genitourinary: Has urostomy bag Integumentary: Confirms right ischial decubitus ulcer  Vital signs in last 24 hours: [min-max] current  Temp:  [98 F (36.7 C)-98.2 F (36.8 C)] 98 F (36.7 C) (12/17 2014) Pulse Rate:  [99-104] 102 (12/18 0406) Resp:  [15-18] 15 (12/18 0406) BP: (102-112)/(67-81) 106/77 (12/18 0406) SpO2:  [92 %-98 %] 98 % (12/18 0406) Weight:  [54.9 kg] 54.9 kg (12/17 1010)     Height: 4\' 6"  (137.2 cm) Weight: 54.9 kg BMI (Calculated): 29.17   Intake/Output last 2 shifts:  12/17 0701 - 12/18 0700 In: -  Out: 1200 [Urine:1200]   Physical Exam:  Constitutional: alert, cooperative and no distress  HENT: normocephalic without obvious abnormality  Respiratory: breathing non-labored at rest  Cardiovascular: regular rate and sinus rhythm  Gastrointestinal: soft, non-tender, and non-distended, no rigidity, guarding, or rebound tenderness; colostomy in left side of abdomen is pink and patent Genitourinary: Urostomy in right side of abdomen with urine in bag Integumentary: Stage IV right ischial decubitus ulcer, 7 x 4.5 x 2 cm, minimal fibrinous exudate at base, no evidence of bleeding, no evidence of infection or purulent drainage   Labs:  CBC Latest Ref Rng & Units 09/20/2021 09/19/2021 03/10/2021  WBC 4.0 - 10.5 K/uL 7.7 9.9 10.3  Hemoglobin 13.0 - 17.0 g/dL 14.3 13.9 12.3(L)  Hematocrit 39.0 - 52.0 % 45.4 44.3  38.8(L)  Platelets 150 - 400 K/uL 523(H) 472(H) 287   CMP Latest Ref Rng & Units 09/20/2021 09/19/2021 03/11/2021  Glucose 70 - 99 mg/dL 73 92 102(H)  BUN 6 - 20 mg/dL 35(H) 29(H) 16  Creatinine 0.61 - 1.24 mg/dL 1.52(H) 1.47(H) 1.10  Sodium 135 - 145 mmol/L 138 136 143  Potassium 3.5 - 5.1 mmol/L 3.5 2.6(LL) 3.5  Chloride 98 - 111 mmol/L 99 95(L) 110  CO2 22 - 32 mmol/L 23 27 22   Calcium 8.9 - 10.3 mg/dL 9.4 9.0 8.6(L)  Total Protein 6.5 - 8.1 g/dL - 8.4(H) -  Total Bilirubin 0.3 - 1.2 mg/dL - 1.0 -  Alkaline Phos 38 - 126 U/L - 81 -  AST 15 - 41 U/L - 29 -  ALT 0 - 44 U/L - 32 -    Imaging studies: No new pertinent imaging studies   Assessment/Plan:  55 y.o. male with right ischial osteomyelitis, noted on CT, secondary to unstageable right ischial decubitus ulcer, complicated by pertinent comorbidities including spina bifida with paraplegia.   -Right ischial cubitus ulcer dressing changed today, no evidence of infection or purulent drainage  -Daily nursing dressing changes with Santyl, damp Kerlix, ED pad and paper tape  -Antibiotics per primary team for treatment of osteomyelitis  -Pain control as needed  -Care per primary team  All of the above findings and recommendations were discussed with the patient, and all of patient's questions were answered to his expressed satisfaction.   -- Graciella Freer, DO

## 2021-09-20 NOTE — Progress Notes (Signed)
PROGRESS NOTE    [name]  UVO:[id number] DOB: [birth year] DOA: 09/19/2021 PCP: Idelle Crouch, MD   Brief Narrative:  [name] is a 55 y.o. male who presents from home where he lives with his sister with medical history significant of spina bifida, neurogenic bladder requiring ileal conduit urinary diversion, chronic bilateral hydronephrosis and bilateral nephrolithiasis status post PCNL, s/of colostomy, hypertension, hyperlipidemia, GERD, CKD-3, esophageal ulcer with bleeding, who presents with worsening pressure ulcer. General surgery consulted for assistance.  Assessment & Plan:   Osteomyelitis of right hip ([place]), does not meet sepsis criteria Stage 4 right ischial decubitus ulcer, POA -General surgery following, plan for debridement of necrotic tissue -CT showed right inferior buttock decubitus ulcer extends to the posteroinferior right ischium where there is evidence of osteomyelitis.   -Continue vancomycin metronidazole and cefepime for coverage until cultures result -PICC line placed today due to poor IV access, if blood cultures result positive we will likely need to replace/exhange this line   Hypokalemia:  Resolved with replacement follow repeat labs    GERD (gastroesophageal reflux disease) Protonix   Hyperlipidemia Crestor   HTN (hypertension) IV hydralazine as needed Currently holding home HCTZ in the setting of AKI as below   Spina bifida [facility]): -fall precaution   AKI on CKD (chronic kidney disease), stage IIIa:  -Creatinine continues to trend upwards  -Follow urine output hold HCTZ continue IV fluids     DVT ppx: SQ Heparin  Code Status: Full code Family Communication: not done, no family member is at bed side.    Status is: inpt  Dispo: The patient is from: Home              Anticipated d/c is to: TBD              Anticipated d/c date is: 48-72h              Patient currently NOT medically stable for discharge  Consultants:   General Surgery  Procedures:  Pending above  Antimicrobials:  Cefepime/flagyl/vanc 09/19/2021, ongoing  Subjective: No acute issues or events overnight denies nausea vomiting diarrhea constipation headache fevers chills or chest pain, somewhat irritated at multiple IV sticks given poor vascular access  Objective: Vitals:   09/19/21 1010 09/19/21 2014 09/20/21 0015 09/20/21 0406  BP:  108/73 112/81 106/77  Pulse:  (!) 104 99 (!) 102  Resp:  16 16 15   Temp:  98 F (36.7 C)    TempSrc:  Oral    SpO2:  98% 98% 98%  Weight: 54.9 kg     Height: 4\' 6"  (1.372 m)       Intake/Output Summary (Last 24 hours) at 09/20/2021 0740 Last data filed at 09/19/2021 1841 Gross per 24 hour  Intake --  Output 1200 ml  Net -1200 ml   Filed Weights   09/19/21 1010  Weight: 54.9 kg    Examination:  General:  Pleasantly resting in bed, No acute distress.  Cachectic in appearance HEENT:  Normocephalic atraumatic.  Sclerae nonicteric, noninjected.  Extraocular movements intact bilaterally. Neck:  Without mass or deformity.  Trachea is midline. Lungs:  Clear to auscultate bilaterally without rhonchi, wheeze, or rales. Heart:  Regular rate and rhythm.  Without murmurs, rubs, or gallops. Abdomen:  Soft, nontender, nondistended.  Without guarding or rebound.  Urostomy and colostomy noted Extremities: Muscle wasting noted without overt joint deformity. Vascular:  Dorsalis pedis and posterior tibial pulses palpable bilaterally. Skin:  Warm and dry, ischial decubitus ulcer  over right hip   Data Reviewed: I have personally reviewed following labs and imaging studies  CBC: Recent Labs  Lab 09/19/21 1011 09/20/21 0557  WBC 9.9 7.7  NEUTROABS 7.8*  --   HGB 13.9 14.3  HCT 44.3 45.4  MCV 85.0 84.7  PLT 472* 644*   Basic Metabolic Panel: Recent Labs  Lab 09/19/21 1011 09/20/21 0557  NA 136 138  K 2.6* 3.5  CL 95* 99  CO2 27 23  GLUCOSE 92 73  BUN 29* 35*  CREATININE 1.47* 1.52*   CALCIUM 9.0 9.4  MG 2.7*  --    GFR: Estimated Creatinine Clearance: 34.3 mL/min (A) (by C-G formula based on SCr of 1.52 mg/dL (H)). Liver Function Tests: Recent Labs  Lab 09/19/21 1011  AST 29  ALT 32  ALKPHOS 81  BILITOT 1.0  PROT 8.4*  ALBUMIN 3.3*   No results for input(s): LIPASE, AMYLASE in the last 168 hours. No results for input(s): AMMONIA in the last 168 hours. Coagulation Profile: Recent Labs  Lab 09/19/21 1717  INR 1.3*   Cardiac Enzymes: No results for input(s): CKTOTAL, CKMB, CKMBINDEX, TROPONINI in the last 168 hours. BNP (last 3 results) No results for input(s): PROBNP in the last 8760 hours. HbA1C: No results for input(s): HGBA1C in the last 72 hours. CBG: No results for input(s): GLUCAP in the last 168 hours. Lipid Profile: No results for input(s): CHOL, HDL, LDLCALC, TRIG, CHOLHDL, LDLDIRECT in the last 72 hours. Thyroid Function Tests: No results for input(s): TSH, T4TOTAL, FREET4, T3FREE, THYROIDAB in the last 72 hours. Anemia Panel: No results for input(s): VITAMINB12, FOLATE, FERRITIN, TIBC, IRON, RETICCTPCT in the last 72 hours. Sepsis Labs: Recent Labs  Lab 09/19/21 1011  LATICACIDVEN 1.3    Recent Results (from the past 240 hour(s))  Resp Panel by RT-PCR (Flu A&B, Covid) Nasopharyngeal Swab     Status: None   Collection Time: 09/19/21  5:17 PM   Specimen: Nasopharyngeal Swab; Nasopharyngeal(NP) swabs in vial transport medium  Result Value Ref Range Status   SARS Coronavirus 2 by RT PCR NEGATIVE NEGATIVE Final    Comment: (NOTE) SARS-CoV-2 target nucleic acids are NOT DETECTED.  The SARS-CoV-2 RNA is generally detectable in upper respiratory specimens during the acute phase of infection. The lowest concentration of SARS-CoV-2 viral copies this assay can detect is 138 copies/mL. A negative result does not preclude SARS-Cov-2 infection and should not be used as the sole basis for treatment or other patient management decisions. A  negative result may occur with  improper specimen collection/handling, submission of specimen other than nasopharyngeal swab, presence of viral mutation(s) within the areas targeted by this assay, and inadequate number of viral copies(<138 copies/mL). A negative result must be combined with clinical observations, patient history, and epidemiological information. The expected result is Negative.  Fact Sheet for Patients:  EntrepreneurPulse.com.au  Fact Sheet for Healthcare Providers:  IncredibleEmployment.be  This test is no t yet approved or cleared by the [place] FDA and  has been authorized for detection and/or diagnosis of SARS-CoV-2 by FDA under an Emergency Use Authorization (EUA). This EUA will remain  in effect (meaning this test can be used) for the duration of the COVID-19 declaration under Section 564(b)(1) of the Act, 21 U.S.C.section 360bbb-3(b)(1), unless the authorization is terminated  or revoked sooner.       Influenza A by PCR NEGATIVE NEGATIVE Final   Influenza B by PCR NEGATIVE NEGATIVE Final    Comment: (NOTE) The Xpert Xpress  SARS-CoV-2/FLU/RSV plus assay is intended as an aid in the diagnosis of influenza from Nasopharyngeal swab specimens and should not be used as a sole basis for treatment. Nasal washings and aspirates are unacceptable for Xpert Xpress SARS-CoV-2/FLU/RSV testing.  Fact Sheet for Patients: EntrepreneurPulse.com.au  Fact Sheet for Healthcare Providers: IncredibleEmployment.be  This test is not yet approved or cleared by the [place] FDA and has been authorized for detection and/or diagnosis of SARS-CoV-2 by FDA under an Emergency Use Authorization (EUA). This EUA will remain in effect (meaning this test can be used) for the duration of the COVID-19 declaration under Section 564(b)(1) of the Act, 21 U.S.C. section 360bbb-3(b)(1), unless the authorization  is terminated or revoked.  Performed at [facility], [address] [place], [place] [address]   Blood culture (routine x 2)     Status: None (Preliminary result)   Collection Time: 09/19/21  5:17 PM   Specimen: BLOOD  Result Value Ref Range Status   Specimen Description BLOOD LEFT AC  Final   Special Requests   Final    BOTTLES DRAWN AEROBIC AND ANAEROBIC Blood Culture results may not be optimal due to an excessive volume of blood received in culture bottles   Culture   Final    NO GROWTH < 12 HOURS Performed at [facility], [address], [place], [place] [address]    Report Status PENDING  Incomplete         Radiology Studies: CT PELVIS WO CONTRAST  Result Date: 09/19/2021 CLINICAL DATA:  Soft tissue infection suspected. [place] to the gluteal region with associated pain. EXAM: CT PELVIS WITHOUT CONTRAST TECHNIQUE: Multidetector CT imaging of the pelvis was performed following the standard protocol without intravenous contrast. COMPARISON:  CT, 03/07/2021. FINDINGS: Urinary Tract: Right lower quadrant ileostomy. Visualized ureters converge on an ileo conduit anterior to the pelvic brim. Visualized ureters are normal in caliber. Inferior aspects of the kidneys show calcifications similar to the prior CT. Bladder is mostly decompressed. Bowel: No evidence of bowel obstruction or inflammation. Descending colon extends into a left lower quadrant colostomy, stable. Vascular/Lymphatic: No significant vascular abnormality. No enlarged lymph nodes. Reproductive:  Enlarged prostate, stable. Other:  No free fluid in the visualized abdomen or pelvis. Musculoskeletal: Decubitus ulcer tracks from the inferior right buttock to the posteroinferior ischium where there is bone resorption consistent with osteomyelitis. Ulceration shows surrounding inflammation, but no defined collection to suggest an abscess. This is new compared to the prior CT. Soft tissue crease extends from  the inferior left buttock to the posteroinferior left ischium with associated bony reactive changes. There are advanced arthropathic changes of the left hip that appears due to dysplasia. The acetabula show all, femoral head flattened/remodeled, with surrounding synovial thickening and a joint effusion, all findings stable compared to the prior CT. IMPRESSION: 1. Right inferior buttock decubitus ulcer extends to the posteroinferior right ischium where there is evidence of osteomyelitis. 2. No evidence of an abscess. 3. No other acute abnormality of the pelvis. 4. Chronic findings including left hip changes as detailed, stable from the prior CT. Electronically Signed   By: Lajean Manes M.D.   On: 09/19/2021 12:22        Scheduled Meds:  collagenase   Topical Daily   dicyclomine  20 mg Oral BID   heparin  5,000 Units Subcutaneous Q8H   multivitamin with minerals  1 tablet Oral Daily   pantoprazole  40 mg Oral Daily   rosuvastatin  10 mg Oral  QHS   sucralfate  1 g Oral TID AC   traZODone  50 mg Oral QHS   Continuous Infusions:  ceFEPime (MAXIPIME) IV     ceFEPime (MAXIPIME) IV     lactated ringers     metronidazole     vancomycin     vancomycin       LOS: 1 day   Time spent: 34min  Alizzon Dioguardi C Shalen Petrak, DO [facility]  If 7PM-7AM, please contact night-coverage [email or web address]  09/20/2021, 7:40 AM

## 2021-09-20 NOTE — Progress Notes (Addendum)
Peripherally Inserted Central Catheter Placement  The IV Nurse has discussed with the patient and sister (legal guardian) authorized to consent for the patient, the purpose of this procedure and the potential benefits and risks involved with this procedure.  The benefits include less needle sticks, lab draws from the catheter, and the patient may be discharged home with the catheter. Risks include, but not limited to, infection, bleeding, blood clot (thrombus formation), and puncture of an artery; nerve damage and irregular heartbeat and possibility to perform a PICC exchange if needed/ordered by physician.  Alternatives to this procedure were also discussed.  Bard Power PICC patient education guide, fact sheet on infection prevention and patient information card has been provided to patient /or left at bedside. Pt alert and oriented x 4, and agreeable to procedure. All questions answered.    PICC Placement Documentation  PICC Single Lumen 00/51/10 Right Basilic 37 cm 0 cm (Active)  Indication for Insertion or Continuance of Line Prolonged intravenous therapies 09/20/21 1249  Exposed Catheter (cm) 0 cm 09/20/21 1249  Site Assessment Clean;Dry;Intact 09/20/21 1249  Line Status Flushed;Saline locked;Blood return noted 09/20/21 1249  Dressing Type Transparent;Securing device 09/20/21 1249  Dressing Status Clean;Dry;Intact 09/20/21 1249  Antimicrobial disc in place? Yes 09/20/21 1249  Safety Lock Not Applicable 21/11/73 5670  Line Care Connections checked and tightened 09/20/21 1249  Line Adjustment (NICU/IV Team Only) No 09/20/21 1249  Dressing Intervention New dressing;Securement device changed;Antimicrobial disc changed 09/20/21 1249  Dressing Change Due 09/27/21 09/20/21 [place] 09/20/2021, 12:51 PM

## 2021-09-20 NOTE — Consult Note (Signed)
Pharmacy Antibiotic Note  [name] is a 55 y.o. male with medical history including spina bifida, neurogenic bladder, chronic bilateral hydronephrosis, HTN, HLD, CKD stage III, AAA, urinary tract infections, history of MRSA admitted on 09/19/2021 with  infected decubitus ulcers / sacral osteomyelitis .  Pharmacy has been consulted for vancomycin and cefepime dosing. Patient is also ordered metronidazole.   Plan:  Continue Cefepime 2 g IV q12h  Vancomycin loading dose not given 12/17. Ordered 1250 mg x 1 today. Scr is trending up will dose by levels. BL Scr 1.1-1.2.   Height: 4\' 6"  (137.2 cm) Weight: 54.9 kg (121 lb 0.5 oz) IBW/kg (Calculated) : 36.2  Temp (24hrs), Avg:98.1 F (36.7 C), Min:98 F (36.7 C), Max:98.2 F (36.8 C)  Recent Labs  Lab 09/19/21 1011 09/20/21 0557  WBC 9.9 7.7  CREATININE 1.47* 1.52*  LATICACIDVEN 1.3  --      Estimated Creatinine Clearance: 34.3 mL/min (A) (by C-G formula based on SCr of 1.52 mg/dL (H)).    Allergies  Allergen Reactions   Morphine And Related Nausea And Vomiting   Sulfa Antibiotics Other (See Comments)    Reaction: unknown   Ivp Dye [Iodinated Diagnostic Agents] Hives   Latex Rash    Antimicrobials this admission: Vancomycin 12/17 >>  Cefepime 12/17 >>  Metronidazole 12/17 >>   Dose adjustments this admission: N/A  Microbiology results: 12/17 BCx: pending 12/17 UCx: pending   Thank you for allowing pharmacy to be a part of this patients care.  Oswald Hillock 09/20/2021 10:07 AM

## 2021-09-20 NOTE — Consult Note (Signed)
[facility] Nurse Consult Note: Reason for Consult:Consulted by Dr.l Avon Gully for topical care guidance for Nursing staff as they care for his patient's Stage 4 pressure injury to the right ischial tuberosity. Wound was debrided at the bedside by Surgeon Dr. Loletha Grayer. [place] Wound type:Pressure Pressure Injury POA: Yes/No/NA Measurement: 7 cm x 4.5 cm x 2 cm (per Dr. Okey Dupre) Wound bed:See photo post debridement in EHR Drainage (amount, consistency, odor) small to moderate serous Periwound:intact Dressing procedure/placement/frequency:I have provided Nursing with conservative guidance for the care of this wound post debridement which is with normal saline cleanse followed by placement of a continually moist dressing, normal saline moistened gauze changed twice daily topped with dry gauze ads secured with paper tape. This can be changed PRN for drainage strike through onto the exterior of the dressing or for dressing dislodgement.  Recommend follow up either by the surgeon's office or at the outpatient wound care center of the patient's choosing.  Education regarding optimizing nutrition for wound healing, time in the seated position and provision of an appropriate pressure redistribution chair pad while seated will be essential elements of the patient's POC.    [facility] nursing team will not follow, but will remain available to this patient, the nursing and medical teams.  Please re-consult if needed. Thanks, Maudie Flakes, MSN, RN, [place], Arther Abbott  Pager# [phone]

## 2021-09-20 NOTE — Progress Notes (Signed)
Consent obtained from Legal guardian for PICC line.  Matthew RN notified estimated time of arrival, confirmed via secure chat.

## 2021-09-21 DIAGNOSIS — L8990 Pressure ulcer of unspecified site, unspecified stage: Secondary | ICD-10-CM | POA: Insufficient documentation

## 2021-09-21 LAB — BLOOD CULTURE ID PANEL (REFLEXED) - BCID2

## 2021-09-21 LAB — BASIC METABOLIC PANEL
Anion gap: 11 (ref 5–15)
BUN: 34 mg/dL — ABNORMAL HIGH (ref 6–20)
CO2: 24 mmol/L (ref 22–32)
Calcium: 8.6 mg/dL — ABNORMAL LOW (ref 8.9–10.3)
Chloride: 100 mmol/L (ref 98–111)
Creatinine, Ser: 1.38 mg/dL — ABNORMAL HIGH (ref 0.61–1.24)
GFR, Estimated: >60 mL/min (ref >60)
Glucose, Bld: 81 mg/dL (ref 70–99)
Potassium: 2.7 mmol/L — CL (ref 3.5–5.1)
Sodium: 135 mmol/L (ref 135–145)

## 2021-09-21 LAB — URINE CULTURE

## 2021-09-21 LAB — CBC
HCT: 35.9 % — ABNORMAL LOW (ref 39.0–52.0)
Hemoglobin: 11.3 g/dL — ABNORMAL LOW (ref 13.0–17.0)
MCH: 26.5 pg (ref 26.0–34.0)
MCHC: 31.5 g/dL (ref 30.0–36.0)
MCV: 84.1 fL (ref 80.0–100.0)
Platelets: 405 10*3/uL — ABNORMAL HIGH (ref 150–400)
RBC: 4.27 MIL/uL (ref 4.22–5.81)
RDW: 14.3 % (ref 11.5–15.5)
WBC: 7.2 10*3/uL (ref 4.0–10.5)
nRBC: 0 % (ref 0.0–0.2)

## 2021-09-21 LAB — VANCOMYCIN, RANDOM: Vancomycin Rm: 20

## 2021-09-21 MED ORDER — SODIUM CHLORIDE 0.9 % IV SOLN
2.0000 g | Freq: Two times a day (BID) | INTRAVENOUS | Status: DC
  Administered 2021-09-21 – 2021-09-23 (×5): 2 g via INTRAVENOUS
  Filled 2021-09-21 (×7): qty 2

## 2021-09-21 MED ORDER — METRONIDAZOLE 500 MG/100ML IV SOLN
500.0000 mg | Freq: Two times a day (BID) | INTRAVENOUS | Status: DC
Start: 2021-09-21 — End: 2021-09-24
  Administered 2021-09-21 – 2021-09-23 (×4): 500 mg via INTRAVENOUS
  Filled 2021-09-21 (×5): qty 100

## 2021-09-21 MED ORDER — POTASSIUM CHLORIDE CRYS ER 20 MEQ PO TBCR
40.0000 meq | EXTENDED_RELEASE_TABLET | Freq: Four times a day (QID) | ORAL | Status: AC
  Administered 2021-09-21 (×3): 40 meq via ORAL
  Filled 2021-09-21 (×3): qty 2

## 2021-09-21 MED ORDER — VANCOMYCIN HCL 750 MG/150ML IV SOLN
750.0000 mg | INTRAVENOUS | Status: DC
  Administered 2021-09-21 – 2021-09-22 (×2): 750 mg via INTRAVENOUS
  Filled 2021-09-21 (×3): qty 150

## 2021-09-21 NOTE — Progress Notes (Signed)
[facility] Hospital Day(s): 2.   Interval History:  Patient seen and examined No acute events or new complaints overnight.  Patient reports he is doing well; no issues reported with dressing changes He remains without leukocytosis; 7.2K Hypokalemia to 2.7 Renal function at baseline; sCr - 1.38; UO - 600 ccs Bcx with Staph on admission He continues on Vancomycin Dressing changes with santyl daily  Vital signs in last 24 hours: [min-max] current  Temp:  [97.8 F (36.6 C)-98.8 F (37.1 C)] 98.8 F (37.1 C) (12/19 0611) Pulse Rate:  [79-90] 87 (12/19 0611) Resp:  [14-18] 14 (12/19 0611) BP: (102-119)/(72-81) 102/75 (12/19 0611) SpO2:  [97 %-100 %] 98 % (12/19 0611)     Height: 4\' 6"  (137.2 cm) Weight: 54.9 kg BMI (Calculated): 29.17   Intake/Output last 2 shifts:  12/18 0701 - 12/19 0700 In: 572.2 [I.V.:17.8; IV Piggyback:554.4] Out: 600 [Urine:600]   Physical Exam:  Constitutional: alert, cooperative and no distress  Respiratory: breathing non-labored at rest  Cardiovascular: regular rate and sinus rhythm  Integumentary:  7 cm x 4.5 cm x 2 cm to the right ischium, wound bed is clean with minimal fibrinous tissue present, no surrounding erythema, minimal serosanguinous drainage, no odor   Labs:  CBC Latest Ref Rng & Units 09/21/2021 09/20/2021 09/19/2021  WBC 4.0 - 10.5 K/uL 7.2 7.7 9.9  Hemoglobin 13.0 - 17.0 g/dL 11.3(L) 14.3 13.9  Hematocrit 39.0 - 52.0 % 35.9(L) 45.4 44.3  Platelets 150 - 400 K/uL 405(H) 523(H) 472(H)   CMP Latest Ref Rng & Units 09/21/2021 09/20/2021 09/19/2021  Glucose 70 - 99 mg/dL 81 73 92  BUN 6 - 20 mg/dL 34(H) 35(H) 29(H)  Creatinine 0.61 - 1.24 mg/dL 1.38(H) 1.52(H) 1.47(H)  Sodium 135 - 145 mmol/L 135 138 136  Potassium 3.5 - 5.1 mmol/L 2.7(LL) 3.5 2.6(LL)  Chloride 98 - 111 mmol/L 100 99 95(L)  CO2 22 - 32 mmol/L 24 23 27   Calcium 8.9 - 10.3 mg/dL 8.6(L) 9.4 9.0  Total Protein 6.5 - 8.1 g/dL - -  8.4(H)  Total Bilirubin 0.3 - 1.2 mg/dL - - 1.0  Alkaline Phos 38 - 126 U/L - - 81  AST 15 - 41 U/L - - 29  ALT 0 - 44 U/L - - 32    Imaging studies: No new pertinent imaging studies   Assessment/Plan:  55 y.o. male with with right ischial osteomyelitis, noted on CT, secondary to unstageable right ischial decubitus ulcer s/p debridement on 22/02, complicated by pertinent comorbidities including spina bifida with paraplegia.   - Continue IV Abx per primary team; follow up Cx  - Daily dressing changes with Santyl, damp Kerlix, ED pad and paper tape  - Frequent repositioning, pressure offloading as feasible   - No further debridement indicated   - General surgery will sign off but be available if need arises    All of the above findings and recommendations were discussed with the patient, and the medical team, and all of patient's questions were answered to his expressed satisfaction.  -- Chase Simon, PA-C [facility] 09/21/2021, 7:32 AM [phone] M-F: 7am - 4pm

## 2021-09-21 NOTE — Progress Notes (Signed)
PROGRESS NOTE    [name]  GYI:[id number] DOB: 02/16/1966 DOA: 09/19/2021 PCP: Idelle Crouch, MD   Brief Narrative:  [name] is a 55 y.o. male who presents from home where he lives with his sister with medical history significant of spina bifida, neurogenic bladder requiring ileal conduit urinary diversion, chronic bilateral hydronephrosis and bilateral nephrolithiasis status post PCNL, s/of colostomy, hypertension, hyperlipidemia, GERD, CKD-3, esophageal ulcer with bleeding, who presents with worsening pressure ulcer. General surgery consulted for assistance.  Assessment & Plan:   Osteomyelitis of right hip ([place]), does not meet sepsis criteria Stage 4 right ischial decubitus ulcer, POA Rule out bacteremia -General surgery following, s/p debridement of necrotic tissue -CT showed right inferior buttock decubitus ulcer extends to the posteroinferior right ischium where there is evidence of osteomyelitis.   -Continue vancomycin metronidazole and cefepime for coverage until cultures result -PICC line placed due to poor IV access, if blood cultures result positive we will likely need to replace/exhange this line -BCID positive for 1/4 bottles - questionably contaminant (staph species)   Hypokalemia:  Ongoing in the setting of poor p.o. intake, continue to replete appropriately Not currently on diuretics, without profound diarrhea or liquidy stool via ostomy per report   GERD (gastroesophageal reflux disease) Protonix   Hyperlipidemia Crestor   HTN (hypertension) IV hydralazine as needed Currently holding home HCTZ in the setting of AKI as below   Spina bifida [facility]): -fall precaution   AKI on CKD (chronic kidney disease), stage IIIa:  -Creatinine stabilizing, downtrending back towards baseline -Follow urine output hold HCTZ continue IV fluids     DVT ppx: SQ Heparin  Code Status: Full code Family Communication: None at bedside, patient to update sister.     Status is: inpt  Dispo: The patient is from: Home              Anticipated d/c is to: TBD              Anticipated d/c date is: 48-72h              Patient currently NOT medically stable for discharge  Consultants:  General Surgery  Procedures:  Pending above  Antimicrobials:  Cefepime/flagyl/vanc 09/19/2021, ongoing  Subjective: No acute issues or events overnight denies nausea vomiting diarrhea constipation headache fevers chills or chest pain, continues to request discharge which we discussed will depend on his culture results  Objective: Vitals:   09/20/21 1541 09/20/21 1620 09/20/21 2212 09/21/21 0611  BP: 115/72 113/81 119/79 102/75  Pulse: 79 87 90 87  Resp: 18 18 16 14   Temp: 98.7 F (37.1 C) 98.5 F (36.9 C) 97.8 F (36.6 C) 98.8 F (37.1 C)  TempSrc: Oral Axillary Oral Oral  SpO2: 100% 99% 97% 98%  Weight:      Height:        Intake/Output Summary (Last 24 hours) at 09/21/2021 0746 Last data filed at 09/21/2021 0500 Gross per 24 hour  Intake 572.19 ml  Output 600 ml  Net -27.81 ml    Filed Weights   09/19/21 1010  Weight: 54.9 kg    Examination:  General:  Pleasantly resting in bed, No acute distress.  Cachectic in appearance HEENT:  Normocephalic atraumatic.  Sclerae nonicteric, noninjected.  Extraocular movements intact bilaterally. Neck:  Without mass or deformity.  Trachea is midline. Lungs:  Clear to auscultate bilaterally without rhonchi, wheeze, or rales. Heart:  Regular rate and rhythm.  Without murmurs, rubs, or gallops. Abdomen:  Soft, nontender, nondistended.  Without guarding or rebound.  Urostomy and colostomy noted Extremities: Muscle wasting noted without overt joint deformity. Vascular:  Dorsalis pedis and posterior tibial pulses palpable bilaterally. Skin:  Warm and dry, right ischial wound bandage clean dry intact   Data Reviewed: I have personally reviewed following labs and imaging studies  CBC: Recent Labs  Lab  09/19/21 1011 09/20/21 0557 09/21/21 0447  WBC 9.9 7.7 7.2  NEUTROABS 7.8*  --   --   HGB 13.9 14.3 11.3*  HCT 44.3 45.4 35.9*  MCV 85.0 84.7 84.1  PLT 472* 523* 405*    Basic Metabolic Panel: Recent Labs  Lab 09/19/21 1011 09/20/21 0557 09/21/21 0447  NA 136 138 135  K 2.6* 3.5 2.7*  CL 95* 99 100  CO2 27 23 24   GLUCOSE 92 73 81  BUN 29* 35* 34*  CREATININE 1.47* 1.52* 1.38*  CALCIUM 9.0 9.4 8.6*  MG 2.7*  --   --     GFR: Estimated Creatinine Clearance: 37.8 mL/min (A) (by C-G formula based on SCr of 1.38 mg/dL (H)). Liver Function Tests: Recent Labs  Lab 09/19/21 1011  AST 29  ALT 32  ALKPHOS 81  BILITOT 1.0  PROT 8.4*  ALBUMIN 3.3*    No results for input(s): LIPASE, AMYLASE in the last 168 hours. No results for input(s): AMMONIA in the last 168 hours. Coagulation Profile: Recent Labs  Lab 09/19/21 1717  INR 1.3*    Cardiac Enzymes: No results for input(s): CKTOTAL, CKMB, CKMBINDEX, TROPONINI in the last 168 hours. BNP (last 3 results) No results for input(s): PROBNP in the last 8760 hours. HbA1C: No results for input(s): HGBA1C in the last 72 hours. CBG: No results for input(s): GLUCAP in the last 168 hours. Lipid Profile: No results for input(s): CHOL, HDL, LDLCALC, TRIG, CHOLHDL, LDLDIRECT in the last 72 hours. Thyroid Function Tests: No results for input(s): TSH, T4TOTAL, FREET4, T3FREE, THYROIDAB in the last 72 hours. Anemia Panel: No results for input(s): VITAMINB12, FOLATE, FERRITIN, TIBC, IRON, RETICCTPCT in the last 72 hours. Sepsis Labs: Recent Labs  Lab 09/19/21 1011  LATICACIDVEN 1.3     Recent Results (from the past 240 hour(s))  Resp Panel by RT-PCR (Flu A&B, Covid) Nasopharyngeal Swab     Status: None   Collection Time: 09/19/21  5:17 PM   Specimen: Nasopharyngeal Swab; Nasopharyngeal(NP) swabs in vial transport medium  Result Value Ref Range Status   SARS Coronavirus 2 by RT PCR NEGATIVE NEGATIVE Final    Comment:  (NOTE) SARS-CoV-2 target nucleic acids are NOT DETECTED.  The SARS-CoV-2 RNA is generally detectable in upper respiratory specimens during the acute phase of infection. The lowest concentration of SARS-CoV-2 viral copies this assay can detect is 138 copies/mL. A negative result does not preclude SARS-Cov-2 infection and should not be used as the sole basis for treatment or other patient management decisions. A negative result may occur with  improper specimen collection/handling, submission of specimen other than nasopharyngeal swab, presence of viral mutation(s) within the areas targeted by this assay, and inadequate number of viral copies(<138 copies/mL). A negative result must be combined with clinical observations, patient history, and epidemiological information. The expected result is Negative.  Fact Sheet for Patients:  EntrepreneurPulse.com.au  Fact Sheet for Healthcare Providers:  IncredibleEmployment.be  This test is no t yet approved or cleared by the [place] FDA and  has been authorized for detection and/or diagnosis of SARS-CoV-2 by FDA under an Emergency Use Authorization (EUA). This EUA will remain  in effect (meaning this test can be used) for the duration of the COVID-19 declaration under Section 564(b)(1) of the Act, 21 U.S.C.section 360bbb-3(b)(1), unless the authorization is terminated  or revoked sooner.       Influenza A by PCR NEGATIVE NEGATIVE Final   Influenza B by PCR NEGATIVE NEGATIVE Final    Comment: (NOTE) The Xpert Xpress SARS-CoV-2/FLU/RSV plus assay is intended as an aid in the diagnosis of influenza from Nasopharyngeal swab specimens and should not be used as a sole basis for treatment. Nasal washings and aspirates are unacceptable for Xpert Xpress SARS-CoV-2/FLU/RSV testing.  Fact Sheet for Patients: EntrepreneurPulse.com.au  Fact Sheet for Healthcare  Providers: IncredibleEmployment.be  This test is not yet approved or cleared by the [place] FDA and has been authorized for detection and/or diagnosis of SARS-CoV-2 by FDA under an Emergency Use Authorization (EUA). This EUA will remain in effect (meaning this test can be used) for the duration of the COVID-19 declaration under Section 564(b)(1) of the Act, 21 U.S.C. section 360bbb-3(b)(1), unless the authorization is terminated or revoked.  Performed at [facility], [address] [place], [place] [address]   Blood culture (routine x 2)     Status: None (Preliminary result)   Collection Time: 09/19/21  5:17 PM   Specimen: BLOOD  Result Value Ref Range Status   Specimen Description BLOOD LEFT [facility]  Final   Special Requests   Final    BOTTLES DRAWN AEROBIC AND ANAEROBIC Blood Culture results may not be optimal due to an excessive volume of blood received in culture bottles   Culture   Final    NO GROWTH 2 DAYS Performed at [facility], [address], [place], [place] [address]    Report Status PENDING  Incomplete  Culture, blood (Routine X 2) w Reflex to ID Panel     Status: None (Preliminary result)   Collection Time: 09/20/21  5:57 AM   Specimen: BLOOD  Result Value Ref Range Status   Specimen Description BLOOD BLOOD LEFT WRIST  Final   Special Requests   Final    BOTTLES DRAWN AEROBIC ONLY Blood Culture results may not be optimal due to an inadequate volume of blood received in culture bottles   Culture  Setup Time   Final    GRAM POSITIVE COCCI AEROBIC BOTTLE ONLY Organism ID to follow CRITICAL RESULT CALLED TO, READ BACK BY AND VERIFIED WITH: NATHAN BELUE @ 0114 09/21/21 LFD Performed at [facility], [address] [place], [place] [address]    Culture GRAM POSITIVE COCCI  Final   Report Status PENDING  Incomplete  Blood Culture ID Panel (Reflexed)     Status: Abnormal   Collection Time: 09/20/21  5:57 AM   Result Value Ref Range Status   Enterococcus faecalis NOT DETECTED NOT DETECTED Final   Enterococcus Faecium NOT DETECTED NOT DETECTED Final   Listeria monocytogenes NOT DETECTED NOT DETECTED Final   Staphylococcus species DETECTED (A) NOT DETECTED Final    Comment: CRITICAL RESULT CALLED TO, READ BACK BY AND VERIFIED WITH: NATHAN BELUE @ 0114 09/21/21 LFD    Staphylococcus aureus (BCID) NOT DETECTED NOT DETECTED Final   Staphylococcus epidermidis NOT DETECTED NOT DETECTED Final   Staphylococcus lugdunensis NOT DETECTED NOT DETECTED Final   Streptococcus species NOT DETECTED NOT DETECTED Final   Streptococcus agalactiae NOT DETECTED NOT DETECTED Final   Streptococcus pneumoniae NOT DETECTED NOT DETECTED Final   Streptococcus pyogenes NOT DETECTED NOT DETECTED Final  A.calcoaceticus-baumannii NOT DETECTED NOT DETECTED Final   Bacteroides fragilis NOT DETECTED NOT DETECTED Final   Enterobacterales NOT DETECTED NOT DETECTED Final   Enterobacter cloacae complex NOT DETECTED NOT DETECTED Final   Escherichia coli NOT DETECTED NOT DETECTED Final   Klebsiella aerogenes NOT DETECTED NOT DETECTED Final   Klebsiella oxytoca NOT DETECTED NOT DETECTED Final   Klebsiella pneumoniae NOT DETECTED NOT DETECTED Final   Proteus species NOT DETECTED NOT DETECTED Final   Salmonella species NOT DETECTED NOT DETECTED Final   Serratia marcescens NOT DETECTED NOT DETECTED Final   Haemophilus influenzae NOT DETECTED NOT DETECTED Final   Neisseria meningitidis NOT DETECTED NOT DETECTED Final   Pseudomonas aeruginosa NOT DETECTED NOT DETECTED Final   Stenotrophomonas maltophilia NOT DETECTED NOT DETECTED Final   Candida albicans NOT DETECTED NOT DETECTED Final   Candida auris NOT DETECTED NOT DETECTED Final   Candida glabrata NOT DETECTED NOT DETECTED Final   Candida krusei NOT DETECTED NOT DETECTED Final   Candida parapsilosis NOT DETECTED NOT DETECTED Final   Candida tropicalis NOT DETECTED NOT DETECTED  Final   Cryptococcus neoformans/gattii NOT DETECTED NOT DETECTED Final    Comment: Performed at [facility], [address], [place], [place] [address]          Radiology Studies: CT PELVIS WO CONTRAST  Result Date: 09/19/2021 CLINICAL DATA:  Soft tissue infection suspected. [place] to the gluteal region with associated pain. EXAM: CT PELVIS WITHOUT CONTRAST TECHNIQUE: Multidetector CT imaging of the pelvis was performed following the standard protocol without intravenous contrast. COMPARISON:  CT, 03/07/2021. FINDINGS: Urinary Tract: Right lower quadrant ileostomy. Visualized ureters converge on an ileo conduit anterior to the pelvic brim. Visualized ureters are normal in caliber. Inferior aspects of the kidneys show calcifications similar to the prior CT. Bladder is mostly decompressed. Bowel: No evidence of bowel obstruction or inflammation. Descending colon extends into a left lower quadrant colostomy, stable. Vascular/Lymphatic: No significant vascular abnormality. No enlarged lymph nodes. Reproductive:  Enlarged prostate, stable. Other:  No free fluid in the visualized abdomen or pelvis. Musculoskeletal: Decubitus ulcer tracks from the inferior right buttock to the posteroinferior ischium where there is bone resorption consistent with osteomyelitis. Ulceration shows surrounding inflammation, but no defined collection to suggest an abscess. This is new compared to the prior CT. Soft tissue crease extends from the inferior left buttock to the posteroinferior left ischium with associated bony reactive changes. There are advanced arthropathic changes of the left hip that appears due to dysplasia. The acetabula show all, femoral head flattened/remodeled, with surrounding synovial thickening and a joint effusion, all findings stable compared to the prior CT. IMPRESSION: 1. Right inferior buttock decubitus ulcer extends to the posteroinferior right ischium where there is evidence of  osteomyelitis. 2. No evidence of an abscess. 3. No other acute abnormality of the pelvis. 4. Chronic findings including left hip changes as detailed, stable from the prior CT. Electronically Signed   By: Lajean Manes M.D.   On: 09/19/2021 12:22   [place] EKG SITE RITE  Result Date: 09/20/2021 If Site Rite image not attached, placement could not be confirmed due to current cardiac rhythm.       Scheduled Meds:  Chlorhexidine Gluconate Cloth  6 each Topical Daily   collagenase   Topical Daily   dicyclomine  20 mg Oral BID   heparin  5,000 Units Subcutaneous Q8H   multivitamin with minerals  1 tablet Oral Daily   pantoprazole  40 mg Oral Daily  rosuvastatin  10 mg Oral QHS   sodium chloride flush  10-40 mL Intracatheter Q12H   sucralfate  1 g Oral TID AC   traZODone  50 mg Oral QHS   vancomycin variable dose per unstable renal function (pharmacist dosing)   Does not apply See admin instructions   Continuous Infusions:  sodium chloride Stopped (09/21/21 0041)     LOS: 2 days   Time spent: 3min  Aydan Levitz C Malissia Rabbani, DO [facility]  If 7PM-7AM, please contact night-coverage [email or web address]  09/21/2021, 7:46 AM

## 2021-09-21 NOTE — Progress Notes (Signed)
PHARMACY - PHYSICIAN COMMUNICATION CRITICAL VALUE ALERT - BLOOD CULTURE IDENTIFICATION (BCID)  Results for orders placed or performed during the hospital encounter of 04/23/19  Blood Culture ID Panel (Reflexed) (Collected: 04/23/2019  8:07 PM)  Result Value Ref Range   Enterococcus species NOT DETECTED NOT DETECTED   Listeria monocytogenes NOT DETECTED NOT DETECTED   Staphylococcus species NOT DETECTED NOT DETECTED   Staphylococcus aureus (BCID) NOT DETECTED NOT DETECTED   Streptococcus species NOT DETECTED NOT DETECTED   Streptococcus agalactiae NOT DETECTED NOT DETECTED   Streptococcus pneumoniae NOT DETECTED NOT DETECTED   Streptococcus pyogenes NOT DETECTED NOT DETECTED   Acinetobacter baumannii NOT DETECTED NOT DETECTED   Enterobacteriaceae species NOT DETECTED NOT DETECTED   Enterobacter cloacae complex NOT DETECTED NOT DETECTED   Escherichia coli NOT DETECTED NOT DETECTED   Klebsiella oxytoca NOT DETECTED NOT DETECTED   Klebsiella pneumoniae NOT DETECTED NOT DETECTED   Proteus species NOT DETECTED NOT DETECTED   Serratia marcescens NOT DETECTED NOT DETECTED   Haemophilus influenzae NOT DETECTED NOT DETECTED   Neisseria meningitidis NOT DETECTED NOT DETECTED   Pseudomonas aeruginosa NOT DETECTED NOT DETECTED   Candida albicans NOT DETECTED NOT DETECTED   Candida glabrata NOT DETECTED NOT DETECTED   Candida krusei NOT DETECTED NOT DETECTED   Candida parapsilosis NOT DETECTED NOT DETECTED   Candida tropicalis NOT DETECTED NOT DETECTED   BCID results for this pt:  1 of 1 bottle (aerobic, inadequate blood volume) drawn 12/18 with Staph Species, no resistance.  Blood Cx (2 bottles) from 12/17 still no growth either bottle.  Pt currently on Vancomycin, Cefepime, & Flagy for Osteomyelitis.  Pt afebrile past 24 hr, WBC down to 7.7.  Name of physician contacted: Reece Levy, MD  Changes to prescribed antibiotics required: Continue Vancomycin, D/C Cefepime and Flagyl   [name], PharmD, [facility] 09/21/2021 2:39 AM

## 2021-09-21 NOTE — Consult Note (Signed)
Pharmacy Antibiotic Note  [name] is a 55 y.o. male with medical history including spina bifida, neurogenic bladder, chronic bilateral hydronephrosis, HTN, HLD, CKD stage III, AAA, urinary tract infections, history of MRSA admitted on 09/19/2021 with  infected decubitus ulcers / sacral osteomyelitis .  Pharmacy has been consulted for vancomycin and cefepime dosing. Patient is also ordered metronidazole. Scr is now trending back down, although not yet to apparent baseline level.  Plan:  1) continue cefepime 2 grams IV every 12 hours  2) adjust vancomycin dose to 750 mg IV every  24 hours Goal AUC 400-550 Expected AUC: 519.1 SCr used: 1.38 mg/dL Recheck SCr in am   Height: 4\' 6"  (137.2 cm) Weight: 54.9 kg (121 lb 0.5 oz) IBW/kg (Calculated) : 36.2  Temp (24hrs), Avg:98.5 F (36.9 C), Min:97.8 F (36.6 C), Max:98.8 F (37.1 C)  Recent Labs  Lab 09/19/21 1011 09/20/21 0557 09/21/21 0447  WBC 9.9 7.7 7.2  CREATININE 1.47* 1.52* 1.38*  LATICACIDVEN 1.3  --   --      Estimated Creatinine Clearance: 37.8 mL/min (A) (by C-G formula based on SCr of 1.38 mg/dL (H)).    Allergies  Allergen Reactions   Morphine And Related Nausea And Vomiting   Sulfa Antibiotics Other (See Comments)    Reaction: unknown   Ivp Dye [Iodinated Diagnostic Agents] Hives   Latex Rash    Antimicrobials this admission: vancomycin 12/17 >>  cefepime 12/17 >>  metronidazole 12/17 >>   Microbiology results: 12/17 BCx: 1/4 bottles Staph sp 12/17 UCx: contaminated 12/17 SARS CoV-2: negative 12/17 influenza A/B: negative  Thank you for allowing pharmacy to be a part of this patients care.  Dallie Piles 09/21/2021 6:55 AM

## 2021-09-22 DIAGNOSIS — M8668 Other chronic osteomyelitis, other site: Secondary | ICD-10-CM

## 2021-09-22 DIAGNOSIS — L89313 Pressure ulcer of right buttock, stage 3: Secondary | ICD-10-CM

## 2021-09-22 LAB — CBC
HCT: 35.6 % — ABNORMAL LOW (ref 39.0–52.0)
Hemoglobin: 11 g/dL — ABNORMAL LOW (ref 13.0–17.0)
MCH: 26.3 pg (ref 26.0–34.0)
MCHC: 30.9 g/dL (ref 30.0–36.0)
MCV: 85 fL (ref 80.0–100.0)
Platelets: 423 10*3/uL — ABNORMAL HIGH (ref 150–400)
RBC: 4.19 MIL/uL — ABNORMAL LOW (ref 4.22–5.81)
RDW: 14.6 % (ref 11.5–15.5)
WBC: 7.5 10*3/uL (ref 4.0–10.5)
nRBC: 0 % (ref 0.0–0.2)

## 2021-09-22 LAB — CULTURE, BLOOD (ROUTINE X 2)

## 2021-09-22 LAB — BASIC METABOLIC PANEL
Anion gap: 5 (ref 5–15)
BUN: 26 mg/dL — ABNORMAL HIGH (ref 6–20)
CO2: 22 mmol/L (ref 22–32)
Calcium: 8.8 mg/dL — ABNORMAL LOW (ref 8.9–10.3)
Chloride: 113 mmol/L — ABNORMAL HIGH (ref 98–111)
Creatinine, Ser: 1.2 mg/dL (ref 0.61–1.24)
GFR, Estimated: >60 mL/min (ref >60)
Glucose, Bld: 106 mg/dL — ABNORMAL HIGH (ref 70–99)
Potassium: 5.1 mmol/L (ref 3.5–5.1)
Sodium: 140 mmol/L (ref 135–145)

## 2021-09-22 NOTE — Progress Notes (Signed)
Patient requests that dressing not be done at this time; would like to have it done later this am. Barbaraann Faster, RN 6:39 AM 09/22/2021

## 2021-09-22 NOTE — TOC Initial Note (Signed)
Transition of Care [facility]) - Initial/Assessment Note    Patient Details  Name: [name] MRN: [id number] Date of Birth: 11-Dec-1965  [facility]) CM/SW Contact:    Beverly Sessions, RN Phone Number: 09/22/2021, 10:16 AM  Clinical Narrative:                  Admitted FUX:NATFT Admitted from: Home with sister who is his POA.  Sister is patient's Medicaid PCS as well and provides rounds the clock care  PCP: [place] Current home health/prior home health/DME: Old ostomy, WC, hospital bed  Discussed option of home health RN for wound care.  Patient deferred decision to sister.  Spoke with sister via phone.  She is in agreement to home health services and states she does not have a preference of home health agency.  Referral [facility] to [facility] with [facility]   Expected Discharge Plan: [place] Barriers to Discharge: Continued Medical Work up   Patient Goals and CMS Choice     Choice offered to / list presented to : [facility] POA / Guardian  Expected Discharge Plan and Services Expected Discharge Plan: [place] Acute Care Choice: [place] arrangements for the past 2 months: Mobile Home                           HH Arranged: RN [facility] Agency: [place] Date [facility] Agency Contacted: 09/22/21   Representative spoke with at [place]: Tommi Rumps  Prior Living Arrangements/Services Living arrangements for the past 2 months: Mobile Home Lives with:: Siblings Patient language and need for interpreter reviewed:: Yes Do you feel safe going back to the place where you live?: Yes      Need for Family Participation in Patient Care: Yes (Comment) Care giver support system in place?: Yes (comment) Current home services: DME Criminal Activity/Legal Involvement Pertinent to Current Situation/Hospitalization: No - Comment as needed  Activities of Daily Living Home Assistive Devices/Equipment: Eyeglasses, Ostomy supplies ADL  Screening (condition at time of admission) Patient's cognitive ability adequate to safely complete daily activities?: No Is the patient deaf or have difficulty hearing?: No Does the patient have difficulty seeing, even when wearing glasses/contacts?: No Does the patient have difficulty concentrating, remembering, or making decisions?: No Patient able to express need for assistance with ADLs?: Yes Does the patient have difficulty dressing or bathing?: Yes Independently performs ADLs?: No Communication: Independent Dressing (OT): Needs assistance Is this a change from baseline?: Pre-admission baseline Grooming: Needs assistance Is this a change from baseline?: Pre-admission baseline Feeding: Independent Bathing: Needs assistance Is this a change from baseline?: Pre-admission baseline Toileting: Needs assistance Is this a change from baseline?: Pre-admission baseline In/Out Bed: Dependent Is this a change from baseline?: Pre-admission baseline Walks in Home: Dependent Is this a change from baseline?: Pre-admission baseline Does the patient have difficulty walking or climbing stairs?: Yes Weakness of Legs: Both Weakness of Arms/Hands: None  Permission Sought/Granted                  Emotional Assessment       Orientation: : Oriented to Self, Oriented to Place, Oriented to  Time, Oriented to Situation Alcohol / Substance Use: Not Applicable Psych Involvement: No (comment)  Admission diagnosis:  Hypokalemia [E87.6] AKI (acute kidney injury) ([place]) [N17.9] Sacral osteomyelitis ([place]) [M46.28] Osteomyelitis of other site, unspecified type [facility]) [M86.9] Patient Active Problem List  Diagnosis Date Noted   Pressure injury of skin 09/21/2021   Osteomyelitis of right hip ([place]) 09/19/2021   CKD (chronic kidney disease), stage IIIa 09/19/2021   Decubitus ulcer of right perineal ischial region, stage 4 (HCC)    Sepsis due to gram-negative UTI ([place]) 03/08/2021   Septic shock ([place])  03/08/2021   Left ureteral stone 06/27/2020   Acute UTI 06/27/2020   HTN (hypertension) 06/27/2020   AKI (acute kidney injury) ([place]) 06/27/2020   Spina bifida ([place])    Hydronephrosis    Kidney stone    Hyperlipidemia    Hiccups    Acute lower UTI 12/22/2019   GERD (gastroesophageal reflux disease) 12/22/2019   SBO (small bowel obstruction) ([place]) 11/02/2017   Abdominal distention 10/11/2016   Distal intestinal obstruction syndrome ([place]) 10/11/2016   Hypokalemia 10/11/2016   Leukocytosis 10/11/2016   Loose stools 10/11/2016   Fecal impaction in rectum ([place]) 10/10/2016   Nausea & vomiting 06/03/2016   Gastric outlet obstruction 06/03/2016   SIRS (systemic inflammatory response syndrome) ([place]) 06/03/2016   Pressure ulcer 06/03/2016   Impaction of the bowels ([place])    Gastritis    Ulcer of esophagus with bleeding    Nausea    Nausea vomiting and diarrhea    Acute sepsis ([place]) 04/21/2016   PCP:  Idelle Crouch, MD Pharmacy:   [place], [place] [address] Phone: [phone] Fax: [place], [place] - [address] [place] [address] Phone: [phone] Fax: [place] [address] ([address]), [place] [address] [place]) [place] [address] Phone: [phone] Fax: [phone]     Social Determinants of Health (SDOH) Interventions    Readmission Risk Interventions No flowsheet data found.

## 2021-09-22 NOTE — Care Management Important Message (Signed)
Important Message  Patient Details  Name: Tymere Depuy MRN: [id number] Date of Birth: 1966-02-08   Medicare Important Message Given:  Yes     Dannette Barbara 09/22/2021, 11:12 AM

## 2021-09-22 NOTE — Progress Notes (Signed)
PROGRESS NOTE    [name]  AVW:[id number] DOB: June 27, 1966 DOA: 09/19/2021 PCP: Idelle Crouch, MD   Brief Narrative:  [name] is a 55 y.o. male who presents from home where he lives with his sister with medical history significant of spina bifida, neurogenic bladder requiring ileal conduit urinary diversion, chronic bilateral hydronephrosis and bilateral nephrolithiasis status post PCNL, s/of colostomy, hypertension, hyperlipidemia, GERD, CKD-3, esophageal ulcer with bleeding, who presents with worsening pressure ulcer. General surgery consulted for assistance.  Assessment & Plan:   Osteomyelitis of right hip ([place]), does not meet sepsis criteria Stage 4 right ischial decubitus ulcer, POA Rule out bacteremia - General surgery following, s/p debridement of necrotic tissue - ID consulted for antibiotic guidance given lack of surgical culture - CT showed right inferior buttock decubitus ulcer extends to the posteroinferior right ischium where there is evidence of osteomyelitis.   - Continue vancomycin metronidazole and cefepime for coverage until cultures result - PICC line placed due to poor IV access, if blood cultures result positive we will likely need to replace/exhange this line - Chronic foley culture shows multiple species (likely contaminant) - BCID positive for 1/4 bottles - questionably contaminant (staph hominis)   Hypokalemia:  Ongoing in the setting of poor p.o. intake, continue to replete appropriately Not currently on diuretics, without profound diarrhea or liquidy stool via ostomy per report   GERD (gastroesophageal reflux disease) Protonix   Hyperlipidemia Crestor   HTN (hypertension) IV hydralazine as needed Currently holding home HCTZ in the setting of AKI as below   Spina bifida [facility]): -fall precaution   AKI on CKD (chronic kidney disease), stage IIIa:  -Creatinine stabilizing, downtrending back towards baseline -Follow urine output  hold HCTZ continue IV fluids     DVT ppx: SQ Heparin  Code Status: Full code Family Communication: None at bedside, patient to update sister.    Status is: inpt  Dispo: The patient is from: Home              Anticipated d/c is to: TBD              Anticipated d/c date is: 48-72h              Patient currently NOT medically stable for discharge  Consultants:  General Surgery  Procedures:  Pending above  Antimicrobials:  Cefepime/flagyl/vanc 09/19/2021, ongoing  Subjective: No acute issues or events overnight denies nausea vomiting diarrhea constipation headache fevers chills or chest pain, continues to request discharge which we discussed will depend on his culture results  Objective: Vitals:   09/21/21 0817 09/21/21 1534 09/21/21 2154 09/22/21 0348  BP: 113/72 96/66 97/66  102/70  Pulse: 84 81 80 77  Resp: 16 16 20 18   Temp: (!) 97.4 F (36.3 C) 98.4 F (36.9 C) 97.8 F (36.6 C) 98.3 F (36.8 C)  TempSrc: Oral  Oral Axillary  SpO2: 98% 98% 100% 97%  Weight:      Height:        Intake/Output Summary (Last 24 hours) at 09/22/2021 0746 Last data filed at 09/22/2021 0500 Gross per 24 hour  Intake 747.07 ml  Output 1100 ml  Net -352.93 ml    Filed Weights   09/19/21 1010  Weight: 54.9 kg    Examination:  General:  Pleasantly resting in bed, No acute distress.  Cachectic in appearance HEENT:  Normocephalic atraumatic.  Sclerae nonicteric, noninjected.  Extraocular movements intact bilaterally. Neck:  Without mass or deformity.  Trachea is midline. Lungs:  Clear to auscultate bilaterally without rhonchi, wheeze, or rales. Heart:  Regular rate and rhythm.  Without murmurs, rubs, or gallops. Abdomen:  Soft, nontender, nondistended.  Without guarding or rebound.  Urostomy and colostomy noted Extremities: Muscle wasting noted without overt joint deformity. Vascular:  Dorsalis pedis and posterior tibial pulses palpable bilaterally. Skin:  Warm and dry, right  ischial wound bandage clean dry intact   Data Reviewed: I have personally reviewed following labs and imaging studies  CBC: Recent Labs  Lab 09/19/21 1011 09/20/21 0557 09/21/21 0447  WBC 9.9 7.7 7.2  NEUTROABS 7.8*  --   --   HGB 13.9 14.3 11.3*  HCT 44.3 45.4 35.9*  MCV 85.0 84.7 84.1  PLT 472* 523* 405*    Basic Metabolic Panel: Recent Labs  Lab 09/19/21 1011 09/20/21 0557 09/21/21 0447  NA 136 138 135  K 2.6* 3.5 2.7*  CL 95* 99 100  CO2 27 23 24   GLUCOSE 92 73 81  BUN 29* 35* 34*  CREATININE 1.47* 1.52* 1.38*  CALCIUM 9.0 9.4 8.6*  MG 2.7*  --   --     GFR: Estimated Creatinine Clearance: 37.8 mL/min (A) (by C-G formula based on SCr of 1.38 mg/dL (H)). Liver Function Tests: Recent Labs  Lab 09/19/21 1011  AST 29  ALT 32  ALKPHOS 81  BILITOT 1.0  PROT 8.4*  ALBUMIN 3.3*    No results for input(s): LIPASE, AMYLASE in the last 168 hours. No results for input(s): AMMONIA in the last 168 hours. Coagulation Profile: Recent Labs  Lab 09/19/21 1717  INR 1.3*    Cardiac Enzymes: No results for input(s): CKTOTAL, CKMB, CKMBINDEX, TROPONINI in the last 168 hours. BNP (last 3 results) No results for input(s): PROBNP in the last 8760 hours. HbA1C: No results for input(s): HGBA1C in the last 72 hours. CBG: No results for input(s): GLUCAP in the last 168 hours. Lipid Profile: No results for input(s): CHOL, HDL, LDLCALC, TRIG, CHOLHDL, LDLDIRECT in the last 72 hours. Thyroid Function Tests: No results for input(s): TSH, T4TOTAL, FREET4, T3FREE, THYROIDAB in the last 72 hours. Anemia Panel: No results for input(s): VITAMINB12, FOLATE, FERRITIN, TIBC, IRON, RETICCTPCT in the last 72 hours. Sepsis Labs: Recent Labs  Lab 09/19/21 1011  LATICACIDVEN 1.3     Recent Results (from the past 240 hour(s))  Resp Panel by RT-PCR (Flu A&B, Covid) Nasopharyngeal Swab     Status: None   Collection Time: 09/19/21  5:17 PM   Specimen: Nasopharyngeal Swab;  Nasopharyngeal(NP) swabs in vial transport medium  Result Value Ref Range Status   SARS Coronavirus 2 by RT PCR NEGATIVE NEGATIVE Final    Comment: (NOTE) SARS-CoV-2 target nucleic acids are NOT DETECTED.  The SARS-CoV-2 RNA is generally detectable in upper respiratory specimens during the acute phase of infection. The lowest concentration of SARS-CoV-2 viral copies this assay can detect is 138 copies/mL. A negative result does not preclude SARS-Cov-2 infection and should not be used as the sole basis for treatment or other patient management decisions. A negative result may occur with  improper specimen collection/handling, submission of specimen other than nasopharyngeal swab, presence of viral mutation(s) within the areas targeted by this assay, and inadequate number of viral copies(<138 copies/mL). A negative result must be combined with clinical observations, patient history, and epidemiological information. The expected result is Negative.  Fact Sheet for Patients:  EntrepreneurPulse.com.au  Fact Sheet for Healthcare Providers:  IncredibleEmployment.be  This test is no t yet approved or cleared by  the [employer] and  has been authorized for detection and/or diagnosis of SARS-CoV-2 by FDA under an Emergency Use Authorization (EUA). This EUA will remain  in effect (meaning this test can be used) for the duration of the COVID-19 declaration under Section 564(b)(1) of the Act, 21 U.S.C.section 360bbb-3(b)(1), unless the authorization is terminated  or revoked sooner.       Influenza A by PCR NEGATIVE NEGATIVE Final   Influenza B by PCR NEGATIVE NEGATIVE Final    Comment: (NOTE) The Xpert Xpress SARS-CoV-2/FLU/RSV plus assay is intended as an aid in the diagnosis of influenza from Nasopharyngeal swab specimens and should not be used as a sole basis for treatment. Nasal washings and aspirates are unacceptable for Xpert Xpress  SARS-CoV-2/FLU/RSV testing.  Fact Sheet for Patients: EntrepreneurPulse.com.au  Fact Sheet for Healthcare Providers: IncredibleEmployment.be  This test is not yet approved or cleared by the [place] FDA and has been authorized for detection and/or diagnosis of SARS-CoV-2 by FDA under an Emergency Use Authorization (EUA). This EUA will remain in effect (meaning this test can be used) for the duration of the COVID-19 declaration under Section 564(b)(1) of the Act, 21 U.S.C. section 360bbb-3(b)(1), unless the authorization is terminated or revoked.  Performed at [facility], [address] [place], [place] [address]   Blood culture (routine x 2)     Status: None (Preliminary result)   Collection Time: 09/19/21  5:17 PM   Specimen: BLOOD  Result Value Ref Range Status   Specimen Description BLOOD LEFT AC  Final   Special Requests   Final    BOTTLES DRAWN AEROBIC AND ANAEROBIC Blood Culture results may not be optimal due to an excessive volume of blood received in culture bottles   Culture   Final    NO GROWTH 3 DAYS Performed at [facility], [address], [place], [place] [address]    Report Status PENDING  Incomplete  Urine Culture     Status: Abnormal   Collection Time: 09/19/21  6:39 PM   Specimen: Urine, Random  Result Value Ref Range Status   Specimen Description   Final    URINE, RANDOM Performed at [facility], [address], [place], [place] [address]    Special Requests   Final    NONE Performed at [facility], [address], [place], [place] [address]    Culture MULTIPLE SPECIES PRESENT, SUGGEST RECOLLECTION (A)  Final   Report Status 09/21/2021 FINAL  Final  Culture, blood (Routine X 2) w Reflex to ID Panel     Status: None (Preliminary result)   Collection Time: 09/20/21  5:57 AM   Specimen: BLOOD  Result Value Ref Range Status   Specimen Description BLOOD BLOOD LEFT  WRIST  Final   Special Requests   Final    BOTTLES DRAWN AEROBIC ONLY Blood Culture results may not be optimal due to an inadequate volume of blood received in culture bottles   Culture  Setup Time   Final    GRAM POSITIVE COCCI AEROBIC BOTTLE ONLY Organism ID to follow CRITICAL RESULT CALLED TO, READ BACK BY AND VERIFIED WITHLloyd Huger @ 0114 09/21/21 LFD Performed at [facility], [address] [place], [place] [address]    Culture [facility] POSITIVE COCCI  Final   Report Status PENDING  Incomplete  Blood Culture ID Panel (Reflexed)     Status: Abnormal   Collection Time: 09/20/21  5:57 AM  Result Value Ref Range  Status   Enterococcus faecalis NOT DETECTED NOT DETECTED Final   Enterococcus Faecium NOT DETECTED NOT DETECTED Final   Listeria monocytogenes NOT DETECTED NOT DETECTED Final   Staphylococcus species DETECTED (A) NOT DETECTED Final    Comment: CRITICAL RESULT CALLED TO, READ BACK BY AND VERIFIED WITH: NATHAN BELUE @ 0114 09/21/21 LFD    Staphylococcus aureus (BCID) NOT DETECTED NOT DETECTED Final   Staphylococcus epidermidis NOT DETECTED NOT DETECTED Final   Staphylococcus lugdunensis NOT DETECTED NOT DETECTED Final   Streptococcus species NOT DETECTED NOT DETECTED Final   Streptococcus agalactiae NOT DETECTED NOT DETECTED Final   Streptococcus pneumoniae NOT DETECTED NOT DETECTED Final   Streptococcus pyogenes NOT DETECTED NOT DETECTED Final   A.calcoaceticus-baumannii NOT DETECTED NOT DETECTED Final   Bacteroides fragilis NOT DETECTED NOT DETECTED Final   Enterobacterales NOT DETECTED NOT DETECTED Final   Enterobacter cloacae complex NOT DETECTED NOT DETECTED Final   Escherichia coli NOT DETECTED NOT DETECTED Final   Klebsiella aerogenes NOT DETECTED NOT DETECTED Final   Klebsiella oxytoca NOT DETECTED NOT DETECTED Final   Klebsiella pneumoniae NOT DETECTED NOT DETECTED Final   Proteus species NOT DETECTED NOT DETECTED Final   Salmonella species NOT  DETECTED NOT DETECTED Final   Serratia marcescens NOT DETECTED NOT DETECTED Final   Haemophilus influenzae NOT DETECTED NOT DETECTED Final   Neisseria meningitidis NOT DETECTED NOT DETECTED Final   Pseudomonas aeruginosa NOT DETECTED NOT DETECTED Final   Stenotrophomonas maltophilia NOT DETECTED NOT DETECTED Final   Candida albicans NOT DETECTED NOT DETECTED Final   Candida auris NOT DETECTED NOT DETECTED Final   Candida glabrata NOT DETECTED NOT DETECTED Final   Candida krusei NOT DETECTED NOT DETECTED Final   Candida parapsilosis NOT DETECTED NOT DETECTED Final   Candida tropicalis NOT DETECTED NOT DETECTED Final   Cryptococcus neoformans/gattii NOT DETECTED NOT DETECTED Final    Comment: Performed at [facility], [address], [place], [place] [address]          Radiology Studies: [place] EKG SITE RITE  Result Date: 09/20/2021 If Site Rite image not attached, placement could not be confirmed due to current cardiac rhythm.       Scheduled Meds:  Chlorhexidine Gluconate Cloth  6 each Topical Daily   collagenase   Topical Daily   dicyclomine  20 mg Oral BID   heparin  5,000 Units Subcutaneous Q8H   multivitamin with minerals  1 tablet Oral Daily   pantoprazole  40 mg Oral Daily   rosuvastatin  10 mg Oral QHS   sodium chloride flush  10-40 mL Intracatheter Q12H   sucralfate  1 g Oral TID AC   traZODone  50 mg Oral QHS   Continuous Infusions:  sodium chloride Stopped (09/21/21 0041)   ceFEPime (MAXIPIME) IV Stopped (09/21/21 2309)   metronidazole Stopped (09/21/21 1733)   vancomycin Stopped (09/21/21 1836)     LOS: 3 days   Time spent: 81min  Keryn Nessler C Ariona Deschene, DO [facility]  If 7PM-7AM, please contact night-coverage [email or web address]  09/22/2021, 7:46 AM

## 2021-09-22 NOTE — Consult Note (Signed)
NAME: [name]  DOB: 13-Jun-1966  MRN: [id number]  Date/Time: 09/22/2021 1:51 PM  REQUESTING PROVIDER: Dr. Avon Gully Subjective:  REASON FOR CONSULT: Right ischemia tuberosity wound ? [name] is a 55 y.o. with a history of Spina bifida, neurogenic bladder requiring ileal conduit urinary diversion, chronic bilateral hydronephrosis and bilateral nephrolithiasis, status post PCNL status post colostomy, hypertension, hyperlipidemia, GERD, CKD 3 presented to the hospital on 09/19/2021 with worsening pressure ulcers. Patient has chronic pressure ulcer on the right gluteal area which was getting worse in the past few days. In the ED BP 108/73, temperature 98, pulse 104 and sats 98%. WBC 9.9, Hb 13.9, platelet 472 and creatinine 1.47. he underwent CT scan of the buttock area which showed an enlarged prostate which was stable, a decubitus ulcer tracking from the inferior right buttock to the posterior inferior ischium where there is bone resorption consistent with osteomyelitis.  There was ulceration with surrounding inflammation.  There was chronic dislocation of the left hip.  Blood cultures were sent and he was started on IV cefepime, vancomycin and metronidazole. Patient was seen by surgeon and underwent bedside debridement.  Wound care consultant has assessed the picture of the wound and recommended wet-to-dry dressing. I am asked to see the patient for antibiotic recommendation.  Past Medical History:  Diagnosis Date   Abdominal distention 10/11/2016   Acute lower UTI 12/22/2019   Acute sepsis ([place]) 04/21/2016   AKI (acute kidney injury) ([place]) 06/27/2020   Decubitus ulcer    Distal intestinal obstruction syndrome ([place]) 10/11/2016   Fecal impaction in rectum ([place]) 10/10/2016   Gastric outlet obstruction 06/03/2016   Gastritis    GERD (gastroesophageal reflux disease) 12/22/2019   HTN (hypertension) 06/27/2020   Hydronephrosis    Hyperlipidemia    Hypokalemia 10/11/2016   Impaction of  the bowels (HCC)    Kidney stone    Left ureteral stone 06/27/2020   Leukocytosis 10/11/2016   Loose stools 10/11/2016   Nausea & vomiting 06/03/2016   Renal disorder    SBO (small bowel obstruction) ([place]) 11/02/2017   Sepsis due to gram-negative UTI ([place]) 03/08/2021   Septic shock ([place]) 03/08/2021   SIRS (systemic inflammatory response syndrome) ([place]) 06/03/2016   Spina bifida ([place])    Ulcer of esophagus with bleeding     Past Surgical History:  Procedure Laterality Date   ABDOMINAL SURGERY     APPENDECTOMY     BACK SURGERY     COLON SURGERY     CREATION / REVISION OF ILEOSTOMY / JEJUNOSTOMY  05/2015   CYSTOSCOPY WITH RETROGRADE PYELOGRAM, URETEROSCOPY AND STENT PLACEMENT  03/2014   ESOPHAGOGASTRODUODENOSCOPY (EGD) WITH PROPOFOL N/A 06/03/2016   Procedure: ESOPHAGOGASTRODUODENOSCOPY (EGD) WITH PROPOFOL;  Surgeon: [name], MD;  Location: [facility] ENDOSCOPY;  Service: Endoscopy;  Laterality: N/A;   ESOPHAGOGASTRODUODENOSCOPY (EGD) WITH PROPOFOL N/A 04/27/2019   Procedure: ESOPHAGOGASTRODUODENOSCOPY (EGD) WITH PROPOFOL;  Surgeon: Jonathon Bellows, MD;  Location: [facility]OSCOPY;  Service: Gastroenterology;  Laterality: N/A;   ilieostomy     PERCUTANEOUS NEPHROSTOLITHOTOMY     PERCUTANEOUS NEPHROSTOMY     VENTRICULOPERITONEAL SHUNT     vp shunt removal      Social History   Socioeconomic History   Marital status: Single    Spouse name: Not on file   Number of children: Not on file   Years of education: Not on file   Highest education level: Not on file  Occupational History   Occupation: disabled  Tobacco Use   Smoking status: Never  Smokeless tobacco: Never  Substance and Sexual Activity   Alcohol use: No   Drug use: No   Sexual activity: Never  Other Topics Concern   Not on file  Social History Narrative   Not on file   Social Determinants of Health   Financial Resource Strain: Not on file  Food Insecurity: Not on file  Transportation Needs: Not on file  Physical Activity:  Not on file  Stress: Not on file  Social Connections: Not on file  Intimate Partner Violence: Not on file    Family History  Problem Relation Age of Onset   Diabetes Mellitus II Father    Bladder Cancer Neg Hx    Kidney cancer Neg Hx    Prostate cancer Neg Hx    Allergies  Allergen Reactions   Morphine And Related Nausea And Vomiting   Sulfa Antibiotics Other (See Comments)    Reaction: unknown   Ivp Dye [Iodinated Diagnostic Agents] Hives   Latex Rash   I? Current Facility-Administered Medications  Medication Dose Route Frequency Provider Last Rate Last Admin   0.9 %  sodium chloride infusion   Intravenous PRN Little Ishikawa, MD   Stopped at 09/21/21 0041   acetaminophen (TYLENOL) tablet 650 mg  650 mg Oral Q6H PRN Ivor Costa, MD       ceFEPIme (MAXIPIME) 2 g in sodium chloride 0.9 % 100 mL IVPB  2 g Intravenous Q12H Dallie Piles, RPH   Stopped at 09/21/21 2309   Chlorhexidine Gluconate Cloth 2 % PADS 6 each  6 each Topical Daily Little Ishikawa, MD   6 each at 09/22/21 9892   collagenase (SANTYL) ointment   Topical Daily Pappayliou, Flint Melter, DO   Given at 09/22/21 1194   dicyclomine (BENTYL) tablet 20 mg  20 mg Oral BID Ivor Costa, MD   20 mg at 09/22/21 1740   heparin injection 5,000 Units  5,000 Units Subcutaneous Cleophas Dunker, MD   5,000 Units at 09/22/21 8144   hydrALAZINE (APRESOLINE) injection 5 mg  5 mg Intravenous Q2H PRN Ivor Costa, MD       metroNIDAZOLE (FLAGYL) IVPB 500 mg  500 mg Intravenous BID Dallie Piles, RPH 100 mL/hr at 09/22/21 8185 500 mg at 09/22/21 6314   multivitamin with minerals tablet 1 tablet  1 tablet Oral Daily Ivor Costa, MD   1 tablet at 09/22/21 0938   ondansetron (ZOFRAN) injection 4 mg  4 mg Intravenous Q8H PRN Ivor Costa, MD       oxyCODONE-acetaminophen (PERCOCET/ROXICET) 5-325 MG per tablet 1 tablet  1 tablet Oral Q4H PRN Ivor Costa, MD       pantoprazole (PROTONIX) EC tablet 40 mg  40 mg Oral Daily Ivor Costa, MD   40  mg at 09/22/21 9702   rosuvastatin (CRESTOR) tablet 10 mg  10 mg Oral QHS Ivor Costa, MD   10 mg at 09/21/21 2230   sodium chloride flush (NS) 0.9 % injection 10-40 mL  10-40 mL Intracatheter Q12H Little Ishikawa, MD   10 mL at 09/22/21 6378   sodium chloride flush (NS) 0.9 % injection 10-40 mL  10-40 mL Intracatheter PRN Little Ishikawa, MD       sucralfate (CARAFATE) tablet 1 g  1 g Oral TID Renella Cunas, MD   1 g at 09/22/21 1148   traZODone (DESYREL) tablet 50 mg  50 mg Oral Gordan Payment, MD   50 mg at 09/21/21 2230  vancomycin (VANCOREADY) IVPB 750 mg/150 mL  750 mg Intravenous Q24H Dallie Piles, [facility]   Stopped at 09/21/21 1836     Abtx:  Anti-infectives (From admission, onward)    Start     Dose/Rate Route Frequency Ordered Stop   09/21/21 1800  metroNIDAZOLE (FLAGYL) IVPB 500 mg        500 mg 100 mL/hr over 60 Minutes Intravenous 2 times daily 09/21/21 1331     09/21/21 1800  vancomycin (VANCOREADY) IVPB 750 mg/150 mL        750 mg 150 mL/hr over 60 Minutes Intravenous Every 24 hours 09/21/21 1333     09/21/21 1430  ceFEPIme (MAXIPIME) 2 g in sodium chloride 0.9 % 100 mL IVPB        2 g 200 mL/hr over 30 Minutes Intravenous Every 12 hours 09/21/21 1331     09/20/21 1800  vancomycin (VANCOREADY) IVPB 500 mg/100 mL  Status:  Discontinued        500 mg 100 mL/hr over 60 Minutes Intravenous Every 24 hours 09/19/21 1417 09/20/21 1002   09/20/21 1015  ceFEPIme (MAXIPIME) 2 g in sodium chloride 0.9 % 100 mL IVPB  Status:  Discontinued        2 g 200 mL/hr over 30 Minutes Intravenous Every 12 hours 09/19/21 1417 09/21/21 0240   09/20/21 1015  vancomycin (VANCOREADY) IVPB 1250 mg/250 mL        1,250 mg 166.7 mL/hr over 90 Minutes Intravenous  Once 09/20/21 1003 09/20/21 1553   09/20/21 1011  vancomycin variable dose per unstable renal function (pharmacist dosing)  Status:  Discontinued         Does not apply See admin instructions 09/20/21 1012 09/21/21 1333   09/19/21  1415  metroNIDAZOLE (FLAGYL) IVPB 500 mg  Status:  Discontinued        500 mg 100 mL/hr over 60 Minutes Intravenous 2 times daily 09/19/21 1406 09/21/21 0240   09/19/21 1400  vancomycin (VANCOCIN) IVPB 1000 mg/200 mL premix  Status:  Discontinued        1,000 mg 200 mL/hr over 60 Minutes Intravenous  Once 09/19/21 1348 09/20/21 1004   09/19/21 1400  ceFEPIme (MAXIPIME) 2 g in sodium chloride 0.9 % 100 mL IVPB  Status:  Discontinued        2 g 200 mL/hr over 30 Minutes Intravenous  Once 09/19/21 1348 09/20/21 1011       REVIEW OF SYSTEMS:  Const: negative fever, negative chills, negative weight loss Eyes: negative diplopia or visual changes, negative eye pain ENT: negative coryza, negative sore throat Resp: negative cough, hemoptysis, dyspnea Cards: negative for chest pain, palpitations, lower extremity edema GU:has ileal conduit GI:has colostomy Skin: as above Heme: negative for easy bruising and gum/nose bleeding MS: wheel chair bound Neurolo:paraplegia Psych: negative for feelings of anxiety, depression  Endocrine: negative for thyroid, diabetes Allergy/Immunology-sulfa and other meds as listed above Objective:  VITALS:  BP 104/68 (BP Location: Left Arm)    Pulse 78    Temp 98 F (36.7 C) (Oral)    Resp 18    Ht 4\' 6"  (1.372 m)    Wt 54.9 kg    SpO2 97%    BMI 29.18 kg/m  PHYSICAL EXAM:  General: Alert, cooperative, no distress, appears stated age. pale Head: Normocephalic, without obvious abnormality, atraumatic. Eyes: Conjunctivae clear, anicteric sclerae. strabismus ENT Nares normal. No drainage or sinus tenderness. Lips, mucosa, and tongue normal. No Thrush Neck: Supple, symmetrical, no adenopathy, thyroid:  non tender no carotid bruit and no JVD. Back: No CVA tenderness. Lungs: Clear to auscultation bilaterally. No Wheezing or Rhonchi. No rales. Heart: Regular rate and rhythm, no murmur, rub or gallop. Abdomen: Soft, colostomy ileostomy Extremities:rt ischial area  ulcer     Skin: no rash Lymph: Cervical, supraclavicular normal. Neurologic:paraplegia Pertinent Labs Lab Results CBC    Component Value Date/Time   WBC 7.5 09/22/2021 0730   RBC 4.19 (L) 09/22/2021 0730   HGB 11.0 (L) 09/22/2021 0730   HCT 35.6 (L) 09/22/2021 0730   PLT 423 (H) 09/22/2021 0730   MCV 85.0 09/22/2021 0730   MCH 26.3 09/22/2021 0730   MCHC 30.9 09/22/2021 0730   RDW 14.6 09/22/2021 0730   LYMPHSABS 1.0 09/19/2021 1011   MONOABS 0.9 09/19/2021 1011   EOSABS 0.2 09/19/2021 1011   BASOSABS 0.1 09/19/2021 1011    CMP Latest Ref Rng & Units 09/22/2021 09/21/2021 09/20/2021  Glucose 70 - 99 mg/dL 106(H) 81 73  BUN 6 - 20 mg/dL 26(H) 34(H) 35(H)  Creatinine 0.61 - 1.24 mg/dL 1.20 1.38(H) 1.52(H)  Sodium 135 - 145 mmol/L 140 135 138  Potassium 3.5 - 5.1 mmol/L 5.1 2.7(LL) 3.5  Chloride 98 - 111 mmol/L 113(H) 100 99  CO2 22 - 32 mmol/L 22 24 23   Calcium 8.9 - 10.3 mg/dL 8.8(L) 8.6(L) 9.4  Total Protein 6.5 - 8.1 g/dL - - -  Total Bilirubin 0.3 - 1.2 mg/dL - - -  Alkaline Phos 38 - 126 U/L - - -  AST 15 - 41 U/L - - -  ALT 0 - 44 U/L - - -      Microbiology: Recent Results (from the past 240 hour(s))  Resp Panel by RT-PCR (Flu A&B, Covid) Nasopharyngeal Swab     Status: None   Collection Time: 09/19/21  5:17 PM   Specimen: Nasopharyngeal Swab; Nasopharyngeal(NP) swabs in vial transport medium  Result Value Ref Range Status   SARS Coronavirus 2 by RT PCR NEGATIVE NEGATIVE Final    Comment: (NOTE) SARS-CoV-2 target nucleic acids are NOT DETECTED.  The SARS-CoV-2 RNA is generally detectable in upper respiratory specimens during the acute phase of infection. The lowest concentration of SARS-CoV-2 viral copies this assay can detect is 138 copies/mL. A negative result does not preclude SARS-Cov-2 infection and should not be used as the sole basis for treatment or other patient management decisions. A negative result may occur with  improper specimen  collection/handling, submission of specimen other than nasopharyngeal swab, presence of viral mutation(s) within the areas targeted by this assay, and inadequate number of viral copies(<138 copies/mL). A negative result must be combined with clinical observations, patient history, and epidemiological information. The expected result is Negative.  Fact Sheet for Patients:  EntrepreneurPulse.com.au  Fact Sheet for Healthcare Providers:  IncredibleEmployment.be  This test is no t yet approved or cleared by the [place] FDA and  has been authorized for detection and/or diagnosis of SARS-CoV-2 by FDA under an Emergency Use Authorization (EUA). This EUA will remain  in effect (meaning this test can be used) for the duration of the COVID-19 declaration under Section 564(b)(1) of the Act, 21 U.S.C.section 360bbb-3(b)(1), unless the authorization is terminated  or revoked sooner.       Influenza A by PCR NEGATIVE NEGATIVE Final   Influenza B by PCR NEGATIVE NEGATIVE Final    Comment: (NOTE) The Xpert Xpress SARS-CoV-2/FLU/RSV plus assay is intended as an aid in the diagnosis of influenza from Nasopharyngeal swab specimens  and should not be used as a sole basis for treatment. Nasal washings and aspirates are unacceptable for Xpert Xpress SARS-CoV-2/FLU/RSV testing.  Fact Sheet for Patients: EntrepreneurPulse.com.au  Fact Sheet for Healthcare Providers: IncredibleEmployment.be  This test is not yet approved or cleared by the [place] FDA and has been authorized for detection and/or diagnosis of SARS-CoV-2 by FDA under an Emergency Use Authorization (EUA). This EUA will remain in effect (meaning this test can be used) for the duration of the COVID-19 declaration under Section 564(b)(1) of the Act, 21 U.S.C. section 360bbb-3(b)(1), unless the authorization is terminated or revoked.  Performed at [facility], [address] [place], [place] [address]   Blood culture (routine x 2)     Status: None (Preliminary result)   Collection Time: 09/19/21  5:17 PM   Specimen: BLOOD  Result Value Ref Range Status   Specimen Description BLOOD LEFT AC  Final   Special Requests   Final    BOTTLES DRAWN AEROBIC AND ANAEROBIC Blood Culture results may not be optimal due to an excessive volume of blood received in culture bottles   Culture   Final    NO GROWTH 3 DAYS Performed at [facility], [address] [place], [place] [address]    Report Status PENDING  Incomplete  Urine Culture     Status: Abnormal   Collection Time: 09/19/21  6:39 PM   Specimen: Urine, Random  Result Value Ref Range Status   Specimen Description   Final    URINE, RANDOM Performed at [facility], [address], [place], [place] [address]    Special Requests   Final    NONE Performed at [facility], [address] [place], [place] [address]    Culture MULTIPLE SPECIES PRESENT, SUGGEST RECOLLECTION (A)  Final   Report Status 09/21/2021 FINAL  Final  Culture, blood (Routine X 2) w Reflex to ID Panel     Status: Abnormal   Collection Time: 09/20/21  5:57 AM   Specimen: BLOOD  Result Value Ref Range Status   Specimen Description   Final    BLOOD BLOOD LEFT WRIST Performed at [facility], [address], [place], [place] [address]    Special Requests   Final    BOTTLES DRAWN AEROBIC ONLY Blood Culture results may not be optimal due to an inadequate volume of blood received in culture bottles Performed at [facility], [address] [place], [place] [address]    Culture  Setup Time   Final    GRAM POSITIVE COCCI AEROBIC BOTTLE ONLY Organism ID to follow CRITICAL RESULT CALLED TO, READ BACK BY AND VERIFIED WITH: NATHAN BELUE @ 0114 09/21/21 LFD Performed at [facility], [address] [place], [place] [address]    Culture (A)  Final     STAPHYLOCOCCUS HOMINIS THE SIGNIFICANCE OF ISOLATING THIS ORGANISM FROM A SINGLE SET OF BLOOD CULTURES WHEN MULTIPLE SETS ARE DRAWN IS UNCERTAIN. PLEASE NOTIFY THE MICROBIOLOGY DEPARTMENT WITHIN ONE WEEK IF SPECIATION AND SENSITIVITIES ARE REQUIRED. Performed at [facility], [place] [address], [place], [place] [address]    Report Status 09/22/2021 FINAL  Final  Blood Culture ID Panel (Reflexed)     Status: Abnormal   Collection Time: 09/20/21  5:57 AM  Result Value Ref Range Status   Enterococcus faecalis NOT DETECTED NOT DETECTED Final   Enterococcus Faecium NOT DETECTED NOT DETECTED Final   Listeria monocytogenes NOT DETECTED NOT DETECTED Final  Staphylococcus species DETECTED (A) NOT DETECTED Final    Comment: CRITICAL RESULT CALLED TO, READ BACK BY AND VERIFIED WITH: NATHAN BELUE @ 1093 09/21/21 LFD    Staphylococcus aureus (BCID) NOT DETECTED NOT DETECTED Final   Staphylococcus epidermidis NOT DETECTED NOT DETECTED Final   Staphylococcus lugdunensis NOT DETECTED NOT DETECTED Final   Streptococcus species NOT DETECTED NOT DETECTED Final   Streptococcus agalactiae NOT DETECTED NOT DETECTED Final   Streptococcus pneumoniae NOT DETECTED NOT DETECTED Final   Streptococcus pyogenes NOT DETECTED NOT DETECTED Final   A.calcoaceticus-baumannii NOT DETECTED NOT DETECTED Final   Bacteroides fragilis NOT DETECTED NOT DETECTED Final   Enterobacterales NOT DETECTED NOT DETECTED Final   Enterobacter cloacae complex NOT DETECTED NOT DETECTED Final   Escherichia coli NOT DETECTED NOT DETECTED Final   Klebsiella aerogenes NOT DETECTED NOT DETECTED Final   Klebsiella oxytoca NOT DETECTED NOT DETECTED Final   Klebsiella pneumoniae NOT DETECTED NOT DETECTED Final   Proteus species NOT DETECTED NOT DETECTED Final   Salmonella species NOT DETECTED NOT DETECTED Final   Serratia marcescens NOT DETECTED NOT DETECTED Final   Haemophilus influenzae NOT DETECTED NOT DETECTED Final   Neisseria  meningitidis NOT DETECTED NOT DETECTED Final   Pseudomonas aeruginosa NOT DETECTED NOT DETECTED Final   Stenotrophomonas maltophilia NOT DETECTED NOT DETECTED Final   Candida albicans NOT DETECTED NOT DETECTED Final   Candida auris NOT DETECTED NOT DETECTED Final   Candida glabrata NOT DETECTED NOT DETECTED Final   Candida krusei NOT DETECTED NOT DETECTED Final   Candida parapsilosis NOT DETECTED NOT DETECTED Final   Candida tropicalis NOT DETECTED NOT DETECTED Final   Cryptococcus neoformans/gattii NOT DETECTED NOT DETECTED Final    Comment: Performed at [facility], [address] [place], [place] [address]    IMAGING RESULTS:  CT scan of the buttock area which showed an enlarged prostate which was stable, a decubitus ulcer tracking from the inferior right buttock to the posterior inferior ischium where there is bone resorption consistent with osteomyelitis.   I have personally reviewed the films ? Impression/Recommendation ? Right ischial pressure ulcer with chronic osteomyelitis of the ischial bone s/p debridement Bone not probed No cultures were sent during debridement Sent today Pt currently on cefepime, flagyl and vanco Pt prefers oral antibiotic on discharge as he cannot manage IV along with his ostomies Would do levaquin and flagyl on discharge for 3-4 weeks and follow up the culture results to adjust antibiotics  Staph  hominis and blood culture 1 bottle likely contaminant  Neurogenic bladder status post ileal conduit Spina bifida Bilateral renal staghorn calculus with hydronephrosis   ? ___________________________________________________ Discussed with patient and his nurse Note:  This document was prepared using Dragon voice recognition software and may include unintentional dictation errors.

## 2021-09-23 LAB — BASIC METABOLIC PANEL
Anion gap: 5 (ref 5–15)
BUN: 17 mg/dL (ref 6–20)
CO2: 20 mmol/L — ABNORMAL LOW (ref 22–32)
Calcium: 8.5 mg/dL — ABNORMAL LOW (ref 8.9–10.3)
Chloride: 112 mmol/L — ABNORMAL HIGH (ref 98–111)
Creatinine, Ser: 1.21 mg/dL (ref 0.61–1.24)
GFR, Estimated: >60 mL/min (ref >60)
Glucose, Bld: 85 mg/dL (ref 70–99)
Potassium: 3.7 mmol/L (ref 3.5–5.1)
Sodium: 137 mmol/L (ref 135–145)

## 2021-09-23 LAB — CBC
HCT: 35.1 % — ABNORMAL LOW (ref 39.0–52.0)
Hemoglobin: 10.6 g/dL — ABNORMAL LOW (ref 13.0–17.0)
MCH: 26.4 pg (ref 26.0–34.0)
MCHC: 30.2 g/dL (ref 30.0–36.0)
MCV: 87.5 fL (ref 80.0–100.0)
Platelets: 348 10*3/uL (ref 150–400)
RBC: 4.01 MIL/uL — ABNORMAL LOW (ref 4.22–5.81)
RDW: 15 % (ref 11.5–15.5)
WBC: 7.9 10*3/uL (ref 4.0–10.5)
nRBC: 0 % (ref 0.0–0.2)

## 2021-09-23 MED ORDER — DOXYCYCLINE HYCLATE 50 MG PO CAPS: 50.0000 mg | ORAL_CAPSULE | Freq: Two times a day (BID) | ORAL | 0 refills | Status: AC

## 2021-09-23 MED ORDER — AMOXICILLIN-POT CLAVULANATE 875-125 MG PO TABS: 1.0000 | ORAL_TABLET | Freq: Two times a day (BID) | ORAL | 0 refills | Status: AC

## 2021-09-23 MED ORDER — COLLAGENASE 250 UNIT/GM EX OINT: TOPICAL_OINTMENT | Freq: Every day | CUTANEOUS | 0 refills | Status: DC

## 2021-09-23 NOTE — TOC Transition Note (Signed)
Transition of Care [facility]) - CM/SW Discharge Note   Patient Details  Name: Ryon Layton MRN: [id number] Date of Birth: [birth year]  [facility]) CM/SW Contact:  Beverly Sessions, RN Phone Number: 09/23/2021, 12:57 PM   Clinical Narrative:    Patient to discharge today Per patient his sister will transport at discharge [facility] with [facility] notified of discharge     Barriers to Discharge: Continued Medical Work up   Patient Goals and CMS Choice     Choice offered to / list presented to : [place] / [place]  Discharge Placement                       Discharge Plan and Services     Post Acute Care Choice: [place]: RN [facility] Agency: [place] Date Onset: 09/22/21   Representative spoke with at [place]: [place] ([place]) Interventions     Readmission Risk Interventions No flowsheet data found.

## 2021-09-23 NOTE — Discharge Summary (Addendum)
Physician Discharge Summary  [name] YNW:[id number] DOB: Feb 10, 1966 DOA: 09/19/2021  PCP: Chase Crouch, MD  Admit date: 09/19/2021 Discharge date: 09/23/2021  Discharge disposition: Home with home health care EEG   Recommendations for Outpatient Follow-Up:   Follow-up with PCP in 1 to 2 weeks Follow-up with the wound care clinic in 1 week.   Discharge Diagnosis:   Principal Problem:   Osteomyelitis of right hip (HCC) Active Problems:   Hypokalemia   GERD (gastroesophageal reflux disease)   Hyperlipidemia   HTN (hypertension)   Spina bifida ([place])   CKD (chronic kidney disease), stage IIIa   Pressure injury of skin    Discharge Condition: Stable.  Diet recommendation:  Diet Order             Diet - low sodium heart healthy           Diet Heart Room service appropriate? Yes; Fluid consistency: Thin  Diet effective now                     Code Status: Full Code     Hospital Course:   Mr. [name] is a 55 year old man with medical history significant for spina bifida, neurogenic bladder with ileal conduit urinary diversion, chronic bilateral hydronephrosis, bilateral nephrolithiasis, colostomy, hypertension, hyperlipidemia, GERD, esophageal ulcer with bleeding, CKD stage IIIa.  He presented to the hospital because of worsening decubitus ulcer in the right ischium.  He was admitted to the hospital with chronic osteomyelitis of the right ischial bone and stage IV right ischial decubitus ulcer.  He was treated with empiric IV antibiotics.  He had AKI which improved with IV fluids he also had hypokalemia that was repleted.    Surgical debridement of decubitus ulcer was performed by general surgery.  ID was consulted to assist with management.  IV antibiotics were recommended but patient declined and said he preferred oral antibiotics at discharge.  Case was discussed with Dr. Steva Ready, [place] specialist, who recommended a 2-week course of  doxycycline and Augmentin.  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     Procedures and Diagnostic Studies:   CT PELVIS WO CONTRAST  Result Date: 09/19/2021 CLINICAL DATA:  Soft tissue infection suspected. [place] to the gluteal region with associated pain. EXAM: CT PELVIS WITHOUT CONTRAST TECHNIQUE: Multidetector CT imaging of the pelvis was performed following the standard protocol without intravenous contrast. COMPARISON:  CT, 03/07/2021. FINDINGS: Urinary Tract: Right lower quadrant ileostomy. Visualized ureters converge on an ileo conduit anterior to the pelvic brim. Visualized ureters are normal in caliber. Inferior aspects of the kidneys show calcifications similar to the prior CT.  Bladder is mostly decompressed. Bowel: No evidence of bowel obstruction or inflammation. Descending colon extends into a left lower quadrant colostomy, stable. Vascular/Lymphatic: No significant vascular abnormality. No enlarged lymph nodes. Reproductive:  Enlarged prostate, stable. Other:  No free fluid in the visualized abdomen or pelvis. Musculoskeletal: Decubitus ulcer tracks from the inferior right buttock to the posteroinferior ischium where there is bone resorption  consistent with osteomyelitis. Ulceration shows surrounding inflammation, but no defined collection to suggest an abscess. This is new compared to the prior CT. Soft tissue crease extends from the inferior left buttock to the posteroinferior left ischium with associated bony reactive changes. There are advanced arthropathic changes of the left hip that appears due to dysplasia. The acetabula show all, femoral head flattened/remodeled, with surrounding synovial thickening and a joint effusion, all findings stable compared to the prior CT. IMPRESSION: 1. Right inferior buttock decubitus ulcer extends to the posteroinferior right ischium where there is evidence of osteomyelitis. 2. No evidence of an abscess. 3. No other acute abnormality of the pelvis. 4. Chronic findings including left hip changes as detailed, stable from the prior CT. Electronically Signed   By: Lajean Manes M.D.   On: 09/19/2021 12:22   [place] EKG SITE RITE  Result Date: 09/20/2021 If Site Rite image not attached, placement could not be confirmed due to current cardiac rhythm.    Labs:   Basic Metabolic Panel: Recent Labs  Lab 09/19/21 1011 09/20/21 0557 09/21/21 0447 09/22/21 0730 09/23/21 0510  NA 136 138 135 140 137  K 2.6* 3.5 2.7* 5.1 3.7  CL 95* 99 100 113* 112*  CO2 27 23 24 22  20*  GLUCOSE 92 73 81 106* 85  BUN 29* 35* 34* 26* 17  CREATININE 1.47* 1.52* 1.38* 1.20 1.21  CALCIUM 9.0 9.4 8.6* 8.8* 8.5*  MG 2.7*  --   --   --   --    GFR Estimated Creatinine Clearance: 43.1 mL/min (by C-G formula based on SCr of 1.21 mg/dL). Liver Function Tests: Recent Labs  Lab 09/19/21 1011  AST 29  ALT 32  ALKPHOS 81  BILITOT 1.0  PROT 8.4*  ALBUMIN 3.3*   No results for input(s): LIPASE, AMYLASE in the last 168 hours. No results for input(s): AMMONIA in the last 168 hours. Coagulation profile Recent Labs  Lab 09/19/21 1717  INR 1.3*    CBC: Recent Labs  Lab 09/19/21 1011 09/20/21 0557 09/21/21 0447  09/22/21 0730 09/23/21 0510  WBC 9.9 7.7 7.2 7.5 7.9  NEUTROABS 7.8*  --   --   --   --   HGB 13.9 14.3 11.3* 11.0* 10.6*  HCT 44.3 45.4 35.9* 35.6* 35.1*  MCV 85.0 84.7 84.1 85.0 87.5  PLT 472* 523* 405* 423* 348   Cardiac Enzymes: No results for input(s): CKTOTAL, CKMB, CKMBINDEX, TROPONINI in the last 168 hours. BNP: Invalid input(s): POCBNP CBG: No results for input(s): GLUCAP in the last 168 hours. D-Dimer No results for input(s): DDIMER in the last 72 hours. Hgb A1c No results for input(s): HGBA1C in the last 72 hours. Lipid Profile No results for input(s): CHOL, HDL, LDLCALC, TRIG, CHOLHDL, LDLDIRECT in the last 72 hours. Thyroid function studies No results for input(s): TSH, T4TOTAL, T3FREE, THYROIDAB in the last 72 hours.  Invalid input(s): FREET3 Anemia work up No results for input(s): VITAMINB12, FOLATE, FERRITIN, TIBC, IRON, RETICCTPCT in the last 72 hours. Microbiology Recent Results (from the past 240 hour(s))  Resp Panel by RT-PCR (Flu A&B, Covid) Nasopharyngeal  Swab     Status: None   Collection Time: 09/19/21  5:17 PM   Specimen: Nasopharyngeal Swab; Nasopharyngeal(NP) swabs in vial transport medium  Result Value Ref Range Status   SARS Coronavirus 2 by RT PCR NEGATIVE NEGATIVE Final    Comment: (NOTE) SARS-CoV-2 target nucleic acids are NOT DETECTED.  The SARS-CoV-2 RNA is generally detectable in upper respiratory specimens during the acute phase of infection. The lowest concentration of SARS-CoV-2 viral copies this assay can detect is 138 copies/mL. A negative result does not preclude SARS-Cov-2 infection and should not be used as the sole basis for treatment or other patient management decisions. A negative result may occur with  improper specimen collection/handling, submission of specimen other than nasopharyngeal swab, presence of viral mutation(s) within the areas targeted by this assay, and inadequate number of viral copies(<138 copies/mL). A  negative result must be combined with clinical observations, patient history, and epidemiological information. The expected result is Negative.  Fact Sheet for Patients:  EntrepreneurPulse.com.au  Fact Sheet for Healthcare Providers:  IncredibleEmployment.be  This test is no t yet approved or cleared by the [place] FDA and  has been authorized for detection and/or diagnosis of SARS-CoV-2 by FDA under an Emergency Use Authorization (EUA). This EUA will remain  in effect (meaning this test can be used) for the duration of the COVID-19 declaration under Section 564(b)(1) of the Act, 21 U.S.C.section 360bbb-3(b)(1), unless the authorization is terminated  or revoked sooner.       Influenza A by PCR NEGATIVE NEGATIVE Final   Influenza B by PCR NEGATIVE NEGATIVE Final    Comment: (NOTE) The Xpert Xpress SARS-CoV-2/FLU/RSV plus assay is intended as an aid in the diagnosis of influenza from Nasopharyngeal swab specimens and should not be used as a sole basis for treatment. Nasal washings and aspirates are unacceptable for Xpert Xpress SARS-CoV-2/FLU/RSV testing.  Fact Sheet for Patients: EntrepreneurPulse.com.au  Fact Sheet for Healthcare Providers: IncredibleEmployment.be  This test is not yet approved or cleared by the [place] FDA and has been authorized for detection and/or diagnosis of SARS-CoV-2 by FDA under an Emergency Use Authorization (EUA). This EUA will remain in effect (meaning this test can be used) for the duration of the COVID-19 declaration under Section 564(b)(1) of the Act, 21 U.S.C. section 360bbb-3(b)(1), unless the authorization is terminated or revoked.  Performed at [facility], [address] [place], [place] [address]   Blood culture (routine x 2)     Status: None (Preliminary result)   Collection Time: 09/19/21  5:17 PM   Specimen: BLOOD  Result Value Ref  Range Status   Specimen Description BLOOD LEFT AC  Final   Special Requests   Final    BOTTLES DRAWN AEROBIC AND ANAEROBIC Blood Culture results may not be optimal due to an excessive volume of blood received in culture bottles   Culture   Final    NO GROWTH 3 DAYS Performed at [facility], [address] [place], [place] [address]    Report Status PENDING  Incomplete  Urine Culture     Status: Abnormal   Collection Time: 09/19/21  6:39 PM   Specimen: Urine, Random  Result Value Ref Range Status   Specimen Description   Final    URINE, RANDOM Performed at [facility], [address] [place], [place] [address]    Special Requests   Final    NONE Performed at [facility], [address] [place],  [address]  Culture MULTIPLE SPECIES PRESENT, SUGGEST RECOLLECTION (A)  Final   Report Status 09/21/2021 FINAL  Final  Culture, blood (Routine X 2) w Reflex to ID Panel     Status: Abnormal   Collection Time: 09/20/21  5:57 AM   Specimen: BLOOD  Result Value Ref Range Status   Specimen Description   Final    BLOOD BLOOD LEFT WRIST Performed at [facility], [address], [place], [place] [address]    Special Requests   Final    BOTTLES DRAWN AEROBIC ONLY Blood Culture results may not be optimal due to an inadequate volume of blood received in culture bottles Performed at [facility], [address] [place], [place] [address]    Culture  Setup Time   Final    GRAM POSITIVE COCCI AEROBIC BOTTLE ONLY Organism ID to follow CRITICAL RESULT CALLED TO, READ BACK BY AND VERIFIED WITH: NATHAN BELUE @ 0114 09/21/21 LFD Performed at [facility], [address] [place],  [address]    Culture (A)  Final    STAPHYLOCOCCUS HOMINIS THE SIGNIFICANCE OF ISOLATING THIS ORGANISM FROM A SINGLE SET OF BLOOD CULTURES WHEN MULTIPLE SETS ARE DRAWN IS UNCERTAIN. PLEASE NOTIFY THE MICROBIOLOGY DEPARTMENT WITHIN ONE WEEK IF  SPECIATION AND SENSITIVITIES ARE REQUIRED. Performed at [facility], [place] [address] [place],  [address]    Report Status 09/22/2021 FINAL  Final  Blood Culture ID Panel (Reflexed)     Status: Abnormal   Collection Time: 09/20/21  5:57 AM  Result Value Ref Range Status   Enterococcus faecalis NOT DETECTED NOT DETECTED Final   Enterococcus Faecium NOT DETECTED NOT DETECTED Final   Listeria monocytogenes NOT DETECTED NOT DETECTED Final   Staphylococcus species DETECTED (A) NOT DETECTED Final    Comment: CRITICAL RESULT CALLED TO, READ BACK BY AND VERIFIED WITH: NATHAN BELUE @ 0114 09/21/21 LFD    Staphylococcus aureus (BCID) NOT DETECTED NOT DETECTED Final   Staphylococcus epidermidis NOT DETECTED NOT DETECTED Final   Staphylococcus lugdunensis NOT DETECTED NOT DETECTED Final   Streptococcus species NOT DETECTED NOT DETECTED Final   Streptococcus agalactiae NOT DETECTED NOT DETECTED Final   Streptococcus pneumoniae NOT DETECTED NOT DETECTED Final   Streptococcus pyogenes NOT DETECTED NOT DETECTED Final   A.calcoaceticus-baumannii NOT DETECTED NOT DETECTED Final   Bacteroides fragilis NOT DETECTED NOT DETECTED Final   Enterobacterales NOT DETECTED NOT DETECTED Final   Enterobacter cloacae complex NOT DETECTED NOT DETECTED Final   Escherichia coli NOT DETECTED NOT DETECTED Final   Klebsiella aerogenes NOT DETECTED NOT DETECTED Final   Klebsiella oxytoca NOT DETECTED NOT DETECTED Final   Klebsiella pneumoniae NOT DETECTED NOT DETECTED Final   Proteus species NOT DETECTED NOT DETECTED Final   Salmonella species NOT DETECTED NOT DETECTED Final   Serratia marcescens NOT DETECTED NOT DETECTED Final   Haemophilus influenzae NOT DETECTED NOT DETECTED Final   Neisseria meningitidis NOT DETECTED NOT DETECTED Final   Pseudomonas aeruginosa NOT DETECTED NOT DETECTED Final   Stenotrophomonas maltophilia NOT DETECTED NOT DETECTED Final   Candida albicans NOT DETECTED NOT DETECTED  Final   Candida auris NOT DETECTED NOT DETECTED Final   Candida glabrata NOT DETECTED NOT DETECTED Final   Candida krusei NOT DETECTED NOT DETECTED Final   Candida parapsilosis NOT DETECTED NOT DETECTED Final   Candida tropicalis NOT DETECTED NOT DETECTED Final   Cryptococcus neoformans/gattii NOT DETECTED NOT DETECTED Final    Comment: Performed at [facility], [address] [place], [place] [address]  Aerobic Culture w Gram Stain (superficial specimen)     Status: None (Preliminary result)   Collection Time: 09/22/21  5:58 PM   Specimen: Wound  Result Value Ref Range Status   Specimen Description   Final    WOUND Performed at [facility], [address] [place], [place] [address]    Special Requests   Final    NONE Performed at [facility], [address] [place], [place] [address]    Gram Stain   Final    FEW WBC PRESENT, PREDOMINANTLY MONONUCLEAR FEW GRAM POSITIVE COCCI RARE GRAM POSITIVE RODS    Culture   Final    CULTURE REINCUBATED FOR BETTER GROWTH Performed at [facility], [place] [address] [place], [place] [address]    Report Status PENDING  Incomplete     Discharge Instructions:   Discharge Instructions     Ambulatory referral to Wound Clinic   Complete by: As directed    Stage 4 right ischial decubitus ulcer   Diet - low sodium heart healthy   Complete by: As directed    Discharge wound care:   Complete by: As directed    Apply Santyl to right ischial wound. Cover with damp kerlix, abd pad, and paper tape   Increase activity slowly   Complete by: As directed       Allergies as of 09/23/2021       Reactions   Morphine And Related Nausea And Vomiting   Sulfa Antibiotics Other (See Comments)   Reaction: unknown   Ivp Dye [iodinated Diagnostic Agents] Hives   Latex Rash        Medication List     TAKE these medications    amoxicillin-clavulanate 875-125 MG tablet Commonly known as: Augmentin Take 1  tablet by mouth 2 (two) times daily for 14 days.   AZO CRANBERRY PO Take 1 tablet by mouth daily.   CENTRUM MEN PO Take 1 tablet by mouth daily.   collagenase ointment Commonly known as: SANTYL Apply topically daily. Start taking on: September 24, 2021   dicyclomine 20 MG tablet Commonly known as: BENTYL Take 20 mg by mouth 2 (two) times daily.   doxycycline 50 MG capsule Commonly known as: VIBRAMYCIN Take 1 capsule (50 mg total) by mouth 2 (two) times daily for 14 days.   hydrochlorothiazide 25 MG tablet Commonly known as: HYDRODIURIL Take 25 mg by mouth daily.   pantoprazole 40 MG tablet Commonly known as: PROTONIX Take 1 tablet (40 mg total) by mouth daily.   potassium chloride SA 20 MEQ tablet Commonly known as: KLOR-CON M Take 20 mEq by mouth daily.   rosuvastatin 10 MG tablet Commonly known as: CRESTOR Take 10 mg by mouth at bedtime.   sucralfate 1 g tablet Commonly known as: CARAFATE Take 1 g by mouth 3 (three) times daily.   traZODone 50 MG tablet Commonly known as: DESYREL Take 50 mg by mouth at bedtime.               Discharge Care Instructions  (From admission, onward)           Start     Ordered   09/23/21 0000  Discharge wound care:       Comments: Apply Santyl to right ischial wound. Cover with damp kerlix, abd pad, and paper tape   09/23/21 1230               If you experience  worsening of your admission symptoms, develop shortness of breath, life threatening emergency, suicidal or homicidal thoughts you must seek medical attention immediately by calling 911 or calling your MD immediately  if symptoms less severe.   You must read complete instructions/literature along with all the possible adverse reactions/side effects for all the medicines you take and that have been prescribed to you. Take any new medicines after you have completely understood and accept all the possible adverse reactions/side effects.    Please note    You were cared for by a hospitalist during your hospital stay. If you have any questions about your discharge medications or the care you received while you were in the hospital after you are discharged, you can call the unit and asked to speak with the hospitalist on call if the hospitalist that took care of you is not available. Once you are discharged, your primary care physician will handle any further medical issues. Please note that NO REFILLS for any discharge medications will be authorized once you are discharged, as it is imperative that you return to your primary care physician (or establish a relationship with a primary care physician if you do not have one) for your aftercare needs so that they can reassess your need for medications and monitor your lab values.       Time coordinating discharge: 35 minutes  Signed:  Jennye Boroughs  [facility] 09/23/2021, 2:26 PM   Pager on [email or web address]. If 7PM-7AM, please contact night-coverage at [email or web address]

## 2021-09-23 NOTE — Progress Notes (Signed)
° °  Date of Admission:  09/19/2021    ID: [name] is a 55 y.o. male Principal Problem:   Osteomyelitis of right hip ([place]) Active Problems:   Hypokalemia   GERD (gastroesophageal reflux disease)   Hyperlipidemia   HTN (hypertension)   Spina bifida ([place])   CKD (chronic kidney disease), stage IIIa   Pressure injury of skin    Subjective: Wants to go home Says he is feeling fine and his sister will do the dressing  Medications:   Chlorhexidine Gluconate Cloth  6 each Topical Daily   collagenase   Topical Daily   dicyclomine  20 mg Oral BID   heparin  5,000 Units Subcutaneous Q8H   multivitamin with minerals  1 tablet Oral Daily   pantoprazole  40 mg Oral Daily   rosuvastatin  10 mg Oral QHS   sodium chloride flush  10-40 mL Intracatheter Q12H   sucralfate  1 g Oral TID AC   traZODone  50 mg Oral QHS    Objective: Vital signs in last 24 hours: Temp:  [97.9 F (36.6 C)-98.2 F (36.8 C)] 97.9 F (36.6 C) (12/21 0900) Pulse Rate:  [71-77] 74 (12/21 0900) Resp:  [16-18] 18 (12/21 0900) BP: (91-104)/(61-71) 104/64 (12/21 0900) SpO2:  [99 %-100 %] 99 % (12/21 0900)  PHYSICAL EXAM:  General: Alert, cooperative, no distress, appears stated age.  Lungs: Clear to auscultation bilaterally. No Wheezing or Rhonchi. No rales. Heart: Regular rate and rhythm, no murmur, rub or gallop. Abdomen: Soft, colostomy and ileal conduit Extremities: Rt buttock wound   atraumatic, no cyanosis. No edema. No clubbing Skin: No rashes or lesions. Or bruising Lymph: Cervical, supraclavicular normal. Neurologic: Grossly non-focal  Lab Results Recent Labs    09/22/21 0730 09/23/21 0510  WBC 7.5 7.9  HGB 11.0* 10.6*  HCT 35.6* 35.1*  NA 140 137  K 5.1 3.7  CL 113* 112*  CO2 22 20*  BUN 26* 17  CREATININE 1.20 1.21   Microbiology: Wound culture 09/22/21 12/18- BC 1 of 4 staph hominis    Assessment/Plan: ? Right ischial pressure ulcer with chronic osteomyelitis of the  ischial bone s/p debridement Does not probe to bone No cultures were sent during debridement Sent ton 12/20 Pt currently on cefepime, flagyl and vanco Pt prefers oral antibiotic on discharge as he cannot manage IV along with his ostomies Would do doxy and augmentin on discharge for 2-4 weeks and follow up the culture results to adjust antibiotics Need to follow up at the wound clinic  Staph  hominis and blood culture 1 bottle l- contaminant  Neurogenic bladder status post ileal conduit Spina bifida Bilateral renal staghorn calculus with hydronephrosis  Discussed the management with patient and care team

## 2021-09-24 LAB — CULTURE, BLOOD (ROUTINE X 2): Culture: NO GROWTH

## 2021-09-26 LAB — AEROBIC CULTURE W GRAM STAIN (SUPERFICIAL SPECIMEN)

## 2021-10-13 ENCOUNTER — Ambulatory Visit: Payer: Medicare HMO | Admitting: Physician Assistant

## 2021-10-16 ENCOUNTER — Encounter: Payer: Medicare HMO | Attending: Physician Assistant | Admitting: Physician Assistant

## 2021-10-16 ENCOUNTER — Other Ambulatory Visit: Payer: Self-pay

## 2021-10-16 DIAGNOSIS — L89314 Pressure ulcer of right buttock, stage 4: Secondary | ICD-10-CM | POA: Diagnosis not present

## 2021-10-16 DIAGNOSIS — I129 Hypertensive chronic kidney disease with stage 1 through stage 4 chronic kidney disease, or unspecified chronic kidney disease: Secondary | ICD-10-CM | POA: Diagnosis not present

## 2021-10-16 DIAGNOSIS — N183 Chronic kidney disease, stage 3 unspecified: Secondary | ICD-10-CM | POA: Insufficient documentation

## 2021-10-16 DIAGNOSIS — I10 Essential (primary) hypertension: Secondary | ICD-10-CM | POA: Diagnosis not present

## 2021-10-16 DIAGNOSIS — Z933 Colostomy status: Secondary | ICD-10-CM | POA: Diagnosis not present

## 2021-10-16 DIAGNOSIS — Q760 Spina bifida occulta: Secondary | ICD-10-CM | POA: Diagnosis not present

## 2021-10-16 NOTE — Progress Notes (Signed)
[name], [name] ([id number]) Visit Report for 10/16/2021 Abuse/Suicide Risk Screen Details Patient Name: [name], [name] Date of Service: 10/16/2021 1:00 PM Medical Record Number: [id number] Patient Account Number: [id number] Date of Birth/Sex: Jan 05, 1966 (56 y.o. M) Treating RN: Donnamarie Poag Primary Care Ersel Wadleigh: Fulton Reek Other Clinician: Referring Damonie Furney: Jennye Boroughs Treating Belen Zwahlen/Extender: Skipper Cliche in Treatment: 0 Abuse/Suicide Risk Screen Items Answer ABUSE RISK SCREEN: Has anyone close to you tried to hurt or harm you recentlyo No Do you feel uncomfortable with anyone in your familyo No Has anyone forced you do things that you didnot want to doo No Electronic Signature(s) Signed: 10/16/2021 4:02:12 PM By: Donnamarie Poag Entered By: Donnamarie Poag on 10/16/2021 13:58:03 [name] ([id number]) -------------------------------------------------------------------------------- Activities of Daily Living Details Patient Name: [name] Date of Service: 10/16/2021 1:00 PM Medical Record Number: [id number] Patient Account Number: [id number] Date of Birth/Sex: 09-16-1966 (55 y.o. M) Treating RN: Donnamarie Poag Primary Care Francisca Langenderfer: Fulton Reek Other Clinician: Referring Lidya Mccalister: Jennye Boroughs Treating Haliegh Khurana/Extender: Skipper Cliche in Treatment: 0 Activities of Daily Living Items Answer Activities of Daily Living (Please select one for each item) Drive Automobile Not Able Take Medications Need Assistance Use Telephone Need Assistance Care for Appearance Need Assistance Use Toilet Need Assistance Bath / Shower Need Assistance Dress Self Need Assistance Feed Self Need Assistance Walk Need Assistance Get In / Out Bed Need Assistance Housework Not Able Prepare Meals Need Assistance Handle Money Need Assistance Shop for Self Not Able Electronic Signature(s) Signed: 10/16/2021 4:02:12 PM By: Donnamarie Poag Entered By: Donnamarie Poag on  10/16/2021 13:58:38 [name] ([id number]) -------------------------------------------------------------------------------- Education Screening Details Patient Name: [name] Date of Service: 10/16/2021 1:00 PM Medical Record Number: [id number] Patient Account Number: [id number] Date of Birth/Sex: [birth year] (55 y.o. M) Treating RN: Donnamarie Poag Primary Care Yuma Blucher: Fulton Reek Other Clinician: Referring Kaylan Yates: Jennye Boroughs Treating Dwyane Dupree/Extender: Skipper Cliche in Treatment: 0 Primary Learner Assessed: Patient Learning Preferences/Education Level/Primary Language Learning Preference: Explanation Highest Education Level: High School Preferred Language: English Cognitive Barrier Language Barrier: No Translator Needed: No Memory Deficit: No Emotional Barrier: No Cultural/Religious Beliefs Affecting Medical Care: No Physical Barrier Impaired Vision: No Impaired Hearing: No Decreased Hand dexterity: No Knowledge/Comprehension Knowledge Level: High Comprehension Level: High Ability to understand written instructions: High Ability to understand verbal instructions: High Motivation Anxiety Level: Calm Cooperation: Cooperative Education Importance: Acknowledges Need Interest in Health Problems: Asks Questions Perception: Coherent Willingness to Engage in Self-Management High Activities: Readiness to Engage in Self-Management High Activities: Electronic Signature(s) Signed: 10/16/2021 4:02:12 PM By: Donnamarie Poag Entered ByDonnamarie Poag on 10/16/2021 [place], [place] ([id number]) -------------------------------------------------------------------------------- Fall Risk Assessment Details Patient Name: [name] Date of Service: 10/16/2021 1:00 PM Medical Record Number: [id number] Patient Account Number: [id number] Date of Birth/Sex: 1966-04-10 (55 y.o. M) Treating RN: Donnamarie Poag Primary Care Dalayna Lauter: Fulton Reek Other  Clinician: Referring Elaynah [place]: Jennye Boroughs Treating Tabby Beaston/Extender: Skipper Cliche in Treatment: 0 Fall Risk Assessment Items Have you had 2 or more falls in the last 12 monthso 0 No Have you had any fall that resulted in injury in the last 12 monthso 0 No FALLS RISK SCREEN History of falling - immediate or within 3 months 0 No Secondary diagnosis (Do you have 2 or more medical diagnoseso) 15 Yes Ambulatory aid None/bed rest/wheelchair/nurse 0 Yes Crutches/cane/walker 0 No Furniture 0 No Intravenous therapy Access/Saline/Heparin Lock 0 No Gait/Transferring Normal/ bed rest/ wheelchair 0 Yes Weak (short steps with or without shuffle, stooped but able to lift head while walking, may  0 No seek support from furniture) Impaired (short steps with shuffle, may have difficulty arising from chair, head down, impaired 0 No balance) Mental Status Oriented to own ability 0 Yes Notes w/c bound, self propels Electronic Signature(s) Signed: 10/16/2021 4:02:12 PM By: Donnamarie Poag Entered By: Donnamarie Poag on 10/16/2021 13:59:27 [name] ([id number]) -------------------------------------------------------------------------------- Foot Assessment Details Patient Name: [name] Date of Service: 10/16/2021 1:00 PM Medical Record Number: [id number] Patient Account Number: [id number] Date of Birth/Sex: [birth year] (55 y.o. M) Treating RN: Donnamarie Poag Primary Care Yaacov Koziol: Fulton Reek Other Clinician: Referring Sya Nestler: Jennye Boroughs Treating Alonza Knisley/Extender: Skipper Cliche in Treatment: 0 Foot Assessment Items Site Locations + = Sensation present, - = Sensation absent, C = Callus, U = Ulcer R = Redness, W = Warmth, M = Maceration, PU = Pre-ulcerative lesion F = Fissure, S = Swelling, D = Dryness Assessment Right: Left: Other Deformity: No No Prior Foot Ulcer: No No Prior Amputation: No No Charcot Joint: No No Ambulatory Status: Non-ambulatory Assistance  Device: Wheelchair Gait: Notes no sensation below waist he stated Electronic Signature(s) Signed: 10/16/2021 4:02:12 PM By: Donnamarie Poag Entered By: Donnamarie Poag on 10/16/2021 14:00:18 [name] ([id number]) -------------------------------------------------------------------------------- Nutrition Risk Screening Details Patient Name: [name] Date of Service: 10/16/2021 1:00 PM Medical Record Number: [id number] Patient Account Number: [id number] Date of Birth/Sex: 1966/05/20 (55 y.o. M) Treating RN: Donnamarie Poag Primary Care Gavriela Cashin: Fulton Reek Other Clinician: Referring Valeriano Bain: Jennye Boroughs Treating Emmalyn Hinson/Extender: Skipper Cliche in Treatment: 0 Height (in): Weight (lbs): Body Mass Index (BMI): Nutrition Risk Screening Items Score Screening NUTRITION RISK SCREEN: I have an illness or condition that made me change the kind and/or amount of food I eat 0 No I eat fewer than two meals per day 0 No I eat few fruits and vegetables, or milk products 0 No I have three or more drinks of beer, liquor or wine almost every day 0 No I have tooth or mouth problems that make it hard for me to eat 0 No I don't always have enough money to buy the food I need 0 No I eat alone most of the time 0 No I take three or more different prescribed or over-the-counter drugs a day 1 Yes Without wanting to, I have lost or gained 10 pounds in the last six months 0 No I am not always physically able to shop, cook and/or feed myself 2 Yes Nutrition Protocols Good Risk Protocol Moderate Risk Protocol 0 Provide education on nutrition High Risk Proctocol Risk Level: Moderate Risk Score: 3 Electronic Signature(s) Signed: 10/16/2021 4:02:12 PM By: Donnamarie Poag Entered By: Donnamarie Poag on 10/16/2021 13:59:43

## 2021-10-16 NOTE — Progress Notes (Signed)
Chase, [name] ([id number]) Visit Report for 10/16/2021 Chief Complaint Document Details Patient Name: [name], [name] Date of Service: 10/16/2021 1:00 PM Medical Record Number: [id number] Patient Account Number: [id number] Date of Birth/Sex: Apr 09, 1966 (56 y.o. M) Treating RN: Donnamarie Poag Primary Care Provider: Fulton Reek Other Clinician: Referring Provider: Jennye Boroughs Treating Provider/Extender: Skipper Cliche in Treatment: 0 Information Obtained from: Patient Chief Complaint Right ischial tuberosity pressure ulcer Electronic Signature(s) Signed: 10/16/2021 2:34:39 PM By: Worthy Keeler PA-C Entered By: Worthy Keeler on 10/16/2021 14:34:38 [name] ([id number]) -------------------------------------------------------------------------------- Debridement Details Patient Name: [name] Date of Service: 10/16/2021 1:00 PM Medical Record Number: [id number] Patient Account Number: [id number] Date of Birth/Sex: 08/05/1966 (55 y.o. M) Treating RN: Donnamarie Poag Primary Care Provider: Fulton Reek Other Clinician: Referring Provider: Jennye Boroughs Treating Provider/Extender: Skipper Cliche in Treatment: 0 Debridement Performed for Wound #2 Right Ischial Tuberosity Assessment: Performed By: Physician [name]., PA-C Debridement Type: Chemical/Enzymatic/Mechanical Agent Used: Gauze and saline Level of Consciousness (Pre- Awake and Alert procedure): Pre-procedure Verification/Time Out Yes - 14:37 Taken: Start Time: 14:38 Instrument: Other : saline gauze Bleeding: None Response to Treatment: Procedure was tolerated well Level of Consciousness (Post- Awake and Alert procedure): Post Debridement Measurements of Total Wound Length: (cm) 3 Stage: Category/Stage IV Width: (cm) 5 Depth: (cm) 3 Volume: (cm) 35.343 Character of Wound/Ulcer Post Debridement: Improved Post Procedure Diagnosis Same as Pre-procedure Electronic  Signature(s) Signed: 10/16/2021 4:02:12 PM By: Donnamarie Poag Signed: 10/16/2021 5:59:23 PM By: Worthy Keeler PA-C Entered By: Donnamarie Poag on 10/16/2021 14:39:34 [name] ([id number]) -------------------------------------------------------------------------------- HPI Details Patient Name: [name] Date of Service: 10/16/2021 1:00 PM Medical Record Number: [id number] Patient Account Number: [id number] Date of Birth/Sex: 01-09-1966 (56 y.o. M) Treating RN: Donnamarie Poag Primary Care Provider: Fulton Reek Other Clinician: Referring Provider: Jennye Boroughs Treating Provider/Extender: Skipper Cliche in Treatment: 0 History of Present Illness Location: open ulcer on the left gluteal region near his thigh Quality: Patient reports No Pain. Severity: Patient states wound are getting worse. Duration: Patient has had the wound for > 3 months prior to seeking treatment at the wound center Context: The wound appeared gradually over time Modifying Factors: Consults to this date include:doxycycline which was prescribed by the ER physician at [place] Signs and Symptoms: Patient reports having fever. HPI Description: 56 year old male with a history of spina bifida presenting to [place] with a history of a open wound on the left gluteal region near his upper thigh which she's had for several weeks. He was seen in the ED recently at [place] system and was put on doxycycline and was advised some local care. his past medical history is significant for spinal bifid, neurogenic bladder, kidney stones, sacral pressure sores, constipation. Past surgical history significant for partial cystectomy, ileal conduit, VP shunt removal, percutaneous nephro lithotripsy. In the remote past the patient says he's had some treatment for a ulcer on this area and was treated with a skin graft. Readmission: 10/16/2021 upon evaluation today patient appears to be doing somewhat poorly in regard to a  fairly large wound noted over the right ischial tuberosity location. This is a stage IV pressure ulcer. Of note this is also positive for osteomyelitis upon a CT scan which was performed during the time that he was admitted in the hospital on 09/19/2021. With that being said it is noted in the right inferior buttock that he has a decubitus ulcer which extends to the posterior toe inferior right ischium where there is  evidence of osteomyelitis. When he was here in the office actually missed that this was positive I missed read it and thought it said that there was no osteomyelitis. That is not the case there is in fact osteomyelitis noted here. I actually did review his notes as well in epic. Looking to the discharge summary it appears that the patient was admitted from 09/19/2021 through 09/23/2021. He was discharged home with home health care according to the note. With that being said from what I understand his sister actually takes care of him at this point. He was also to follow-up with a primary care provider and here at the wound care center within a week. I am just now seeing him on the 13th almost a month after discharge. He does have a history of again osteomyelitis of this right hip/ischial tuberosity location. 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 The reason for his admission was actually worsening of the decubitus ulcer upon admission. He does have chronic osteomyelitis of this area. He was treated with empiric antibiotics he had acute kidney injury which improved with IV fluids he also had hypokalemia. Surgical debridement was performed by general surgery at that time and ID was consulted to assist with management according to the notes. IV antibiotics were recommended but patient declined and wanted oral antibiotics at discharge.  Therefore no IV antibiotics were planned for discharge time. This case was under the care of Dr. Steva Ready while he was in the hospital when she did discharge him with a 2-week course of doxycycline and Augmentin according to the notes. It looks like Dr. Doy Hutching office did call and leave a message for the patient to get scheduled for a follow-up visit after hospital discharge I do not see where he showed up in fact it appears that the patient has a no- show letter from Dr. Doy Hutching office noted in epic due to a appointment that was missed on 10/06/2021. Looking at the pictures from Dr. Raelene Bott notes on 09/23/2021 it does appear that the wound is a little bit better as far as the cleanliness of the surface of the wound today but nonetheless still is a very significant wound. Electronic Signature(s) Signed: 10/16/2021 5:45:42 PM By: Worthy Keeler PA-C Entered By: Worthy Keeler on 10/16/2021 17:45:42 [name] ([id number]) -------------------------------------------------------------------------------- Physical Exam Details Patient Name: [name] Date of Service: 10/16/2021 1:00 PM Medical Record Number: [id number] Patient Account Number: [id number] Date of Birth/Sex: 1966/02/06 (55 y.o. M) Treating RN: Donnamarie Poag Primary Care Provider: Fulton Reek Other Clinician: Referring Provider: Jennye Boroughs Treating Provider/Extender: Skipper Cliche in Treatment: 0 Constitutional sitting or standing blood pressure is within target range for patient.. pulse regular and within target range for patient.Marland Kitchen respirations regular, non- labored and within target range for patient.Marland Kitchen temperature within target range for patient.. Well-nourished and well-hydrated in no acute distress. Eyes conjunctiva clear no eyelid edema noted. pupils equal round and reactive to light and accommodation. Ears, Nose, Mouth, and Throat no gross abnormality of ear auricles or external auditory canals.  normal hearing noted during conversation. mucus membranes moist. Respiratory normal breathing without difficulty. Musculoskeletal Patient unable to walk Secondary to paralysis from spina bifida that .Marland Kitchen Psychiatric this patient is able to make decisions and demonstrates good insight into disease process. Alert and Oriented x 3. pleasant and cooperative.  Notes Upon inspection patient has a significant wound over the right ischial tuberosity location which again was evidence of osteomyelitis but that has been treated with a fairly good course of antibiotics oral at home IV in the hospital. Nonetheless at this point I do feel like the wound appears to be much cleaner than the pictures I saw looking through the hospital record but nonetheless this is still a very significant wound. I had a strong discussion with the patient and his sister today. If he is not off of this and continues to just sit in his chair 10 hours a day at a time this is never going to get better. I explained that if they can to take care of this at home there needs to be some determination of how that is going to manage this. Again that is not a question that I can answer for them I can only give them the information for what they need to do. Electronic Signature(s) Signed: 10/16/2021 5:46:57 PM By: Worthy Keeler PA-C Entered By: Worthy Keeler on 10/16/2021 17:46:57 [name] ([id number]) -------------------------------------------------------------------------------- Physician Orders Details Patient Name: [name] Date of Service: 10/16/2021 1:00 PM Medical Record Number: [id number] Patient Account Number: [id number] Date of Birth/Sex: [birth year] (55 y.o. M) Treating RN: Donnamarie Poag Primary Care Provider: Fulton Reek Other Clinician: Referring Provider: Jennye Boroughs Treating Provider/Extender: Skipper Cliche in Treatment: 0 Verbal / Phone Orders: No Diagnosis Coding ICD-10 Coding Code  Description L89.314 Pressure ulcer of right buttock, stage 4 Q76.0 Spina bifida occulta Z93.3 Colostomy status I10 Essential (primary) hypertension I25.10 Atherosclerotic heart disease of native coronary artery without angina pectoris Follow-up Appointments o Return Appointment in 1 week. o Nurse Visit as needed Bathing/ Shower/ Hygiene o Clean wound with Normal Saline or wound cleanser. - keep dressing dry or change after shower o No tub bath. Off-Loading o Roho cushion for wheelchair - Sending order to get [facility] bed/mattress - Has at home o Air fluidized (Group 3) o Turn and reposition every 2 hours Additional Orders / Instructions o Follow Nutritious Diet and Increase Protein Intake Medications-Please add to medication list. o Other: - Pick up Dakins at pharmacy for wound care Wound Treatment Wound #2 - Ischial Tuberosity Wound Laterality: Right Cleanser: Byram Ancillary Kit - 15 Day Supply (DME) (Generic) 1 x Per Day/15 Days Discharge Instructions: Use supplies as instructed; Kit contains: (15) Saline Bullets; (15) 3x3 Gauze; 15 pr Gloves Cleanser: Dakin 16 (oz) 0.25 1 x Per Day/15 Days Discharge Instructions: Use as directed. Cleanser: Normal Saline 1 x Per XMI/68 Days Discharge Instructions: Wash your hands with soap and water. Remove old dressing, discard into plastic bag and place into trash. Cleanse the wound with Normal Saline prior to applying a clean dressing using gauze sponges, not tissues or cotton balls. Do not scrub or use excessive force. Pat dry using gauze sponges, not tissue or cotton balls. Cleanser: Wound Cleanser (DME) (Generic) 1 x Per Day/15 Days Discharge Instructions: Wash your hands with soap and water. Remove old dressing, discard into plastic bag and place into trash. Cleanse the wound with Wound Cleanser prior to applying a clean dressing using gauze sponges, not tissues or cotton balls. Do not scrub or use excessive force.  Pat dry using gauze sponges, not tissue or cotton balls. Topical: skin prep around wound 1 x Per Day/15 Days Discharge Instructions: protect skin where tape is placed Primary Dressing: Gauze (DME) (Generic) 1 x Per Day/15 Days Discharge Instructions: Lightly soaked with  DAKINS-As directed: dry, moistened with saline or moistened with Dakins Solution Secondary Dressing: ABD Pad 5x9 (in/in) (DME) (Generic) 1 x Per Day/15 Days Discharge Instructions: Cover with ABD pad TALVIN, CHRISTIANSON ([id number]) Secured With: 41M Medipore H Soft Cloth Surgical Tape, 2x2 (in/yd) (DME) (Generic) 1 x Per Day/15 Days Patient Medications Allergies: Iodinated Contrast Media, Sulfa (Sulfonamide Antibiotics), latex, morphine Notifications Medication Indication Start End Dakin's Solution 10/16/2021 DOSE miscellaneous 0.25 % solution - Moisten gauze with Dakin's solution then wring out leaving gauze damp not saturated before packing daily into the wound as directed in clini Electronic Signature(s) Signed: 10/16/2021 5:51:48 PM By: Worthy Keeler PA-C Previous Signature: 10/16/2021 4:02:12 PM Version By: Donnamarie Poag Entered By: Worthy Keeler on 10/16/2021 17:51:48 [name] ([id number]) -------------------------------------------------------------------------------- Problem List Details Patient Name: [name] Date of Service: 10/16/2021 1:00 PM Medical Record Number: [id number] Patient Account Number: [id number] Date of Birth/Sex: 08/23/1966 (55 y.o. M) Treating RN: Donnamarie Poag Primary Care Provider: Fulton Reek Other Clinician: Referring Provider: Jennye Boroughs Treating Provider/Extender: Skipper Cliche in Treatment: 0 Active Problems ICD-10 Encounter Code Description Active Date MDM Diagnosis L89.314 Pressure ulcer of right buttock, stage 4 10/16/2021 No Yes Q76.0 Spina bifida occulta 10/16/2021 No Yes Z93.3 Colostomy status 10/16/2021 No Yes I10 Essential (primary) hypertension  10/16/2021 No Yes I25.10 Atherosclerotic heart disease of native coronary artery without angina 10/16/2021 No Yes pectoris Inactive Problems Resolved Problems Electronic Signature(s) Signed: 10/16/2021 2:33:59 PM By: Worthy Keeler PA-C Entered By: Worthy Keeler on 10/16/2021 14:33:59 [name] ([id number]) -------------------------------------------------------------------------------- Progress Note Details Patient Name: [name] Date of Service: 10/16/2021 1:00 PM Medical Record Number: [id number] Patient Account Number: [id number] Date of Birth/Sex: 1966-05-04 (55 y.o. M) Treating RN: Donnamarie Poag Primary Care Provider: Fulton Reek Other Clinician: Referring Provider: Jennye Boroughs Treating Provider/Extender: Skipper Cliche in Treatment: 0 Subjective Chief Complaint Information obtained from Patient Right ischial tuberosity pressure ulcer History of Present Illness (HPI) The following HPI elements were documented for the patient's wound: Location: open ulcer on the left gluteal region near his thigh Quality: Patient reports No Pain. Severity: Patient states wound are getting worse. Duration: Patient has had the wound for > 3 months prior to seeking treatment at the wound center Context: The wound appeared gradually over time Modifying Factors: Consults to this date include:doxycycline which was prescribed by the ER physician at [place] Signs and Symptoms: Patient reports having fever. 56 year old male with a history of spina bifida presenting to [place] with a history of a open wound on the left gluteal region near his upper thigh which she's had for several weeks. He was seen in the ED recently at [place] [facility] and was put on doxycycline and was advised some local care. his past medical history is significant for spinal bifid, neurogenic bladder, kidney stones, sacral pressure sores, constipation. Past surgical history significant for partial  cystectomy, ileal conduit, VP shunt removal, percutaneous nephro lithotripsy. In the remote past the patient says he's had some treatment for a ulcer on this area and was treated with a skin graft. Readmission: 10/16/2021 upon evaluation today patient appears to be doing somewhat poorly in regard to a fairly large wound noted over the right ischial tuberosity location. This is a stage IV pressure ulcer. Of note this is also positive for osteomyelitis upon a CT scan which was performed during the time that he was admitted in the hospital on 09/19/2021. With that being said it is noted in the right inferior buttock that  he has a decubitus ulcer which extends to the posterior toe inferior right ischium where there is evidence of osteomyelitis. When he was here in the office actually missed that this was positive I missed read it and thought it said that there was no osteomyelitis. That is not the case there is in fact osteomyelitis noted here. I actually did review his notes as well in epic. Looking to the discharge summary it appears that the patient was admitted from 09/19/2021 through 09/23/2021. He was discharged home with home health care according to the note. With that being said from what I understand his sister actually takes care of him at this point. He was also to follow-up with a primary care provider and here at the wound care center within a week. I am just now seeing him on the 13th almost a month after discharge. He does have a history of again osteomyelitis of this right hip/ischial tuberosity location. 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 The reason for his admission was actually worsening of the decubitus ulcer upon admission. He does have chronic osteomyelitis of this area. He was treated with empiric antibiotics he had acute  kidney injury which improved with IV fluids he also had hypokalemia. Surgical debridement was performed by general surgery at that time and ID was consulted to assist with management according to the notes. IV antibiotics were recommended but patient declined and wanted oral antibiotics at discharge. Therefore no IV antibiotics were planned for discharge time. This case was under the care of Dr. Steva Ready while he was in the hospital when she did discharge him with a 2-week course of doxycycline and Augmentin according to the notes. It looks like Dr. Doy Hutching office did call and leave a message for the patient to get scheduled for a follow-up visit after hospital discharge I do not see where he showed up in fact it appears that the patient has a no- show letter from Dr. Doy Hutching office noted in epic due to a appointment that was missed on 10/06/2021. Looking at the pictures from Dr. Raelene Bott notes on 09/23/2021 it does appear that the wound is a little bit better as far as the cleanliness of the surface of the wound today but nonetheless still is a very significant wound. Patient History Information obtained from Patient. Allergies Sulfa (Sulfonamide Antibiotics), latex, morphine, Iodinated Contrast Media (Severity: Severe) Family History Cancer - Paternal Grandparents, Diabetes - Father,Mother, Hypertension - Father,Mother, Lung Disease - Mother, Stroke - Mother, No family history of Heart Disease, Kidney Disease, Seizures, Thyroid Problems, Tuberculosis. Social History Never smoker, Marital Status - Single, Alcohol Use - Never, Drug Use - No History. Medical History Eyes Patient has history of Cataracts, Glaucoma Denies history of Optic Neuritis Ear/Nose/Mouth/Throat MENELIK, MCFARREN ([id number]) Denies history of Chronic sinus problems/congestion, Middle ear problems Hematologic/Lymphatic Denies history of Anemia, Hemophilia, Human Immunodeficiency Virus, Lymphedema, Sickle Cell  Disease Respiratory Denies history of Aspiration, Asthma, Chronic Obstructive Pulmonary Disease (COPD), Pneumothorax, Sleep Apnea, Tuberculosis Cardiovascular Patient has history of Coronary Artery Disease, Hypertension Denies history of Angina, Arrhythmia, Congestive Heart Failure, Deep Vein Thrombosis, Hypotension, Myocardial Infarction, Peripheral Arterial Disease, Peripheral Venous Disease, Phlebitis, Vasculitis Endocrine Denies history of Type I Diabetes Immunological Denies history of Lupus Erythematosus, Raynaud s, Scleroderma Integumentary (Skin) Patient has history of History of pressure wounds - hx Musculoskeletal  Patient has history of Osteoarthritis Neurologic Patient has history of Paraplegia - spina bifida Oncologic Denies history of Received Chemotherapy, Received Radiation Psychiatric Denies history of Anorexia/bulimia, Confinement Anxiety Medical And Surgical History Notes Musculoskeletal spina bifida Neurologic Spida Bifida Review of Systems (ROS) Constitutional Symptoms (General Health) Denies complaints or symptoms of Fatigue, Fever, Chills, Marked Weight Change. 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 Hematologic/Lymphatic Denies complaints or symptoms of Bleeding / Clotting Disorders, Human Immunodeficiency Virus. Respiratory Denies complaints or symptoms of Chronic or frequent coughs, Shortness of Breath. Cardiovascular Denies complaints or symptoms of Chest pain, LE edema. Gastrointestinal Denies complaints or symptoms of Frequent diarrhea, Nausea, Vomiting, ileostomy Endocrine Denies complaints or symptoms of Hepatitis, Thyroid disease, Polydypsia (Excessive Thirst). Genitourinary urostomy bag Immunological Denies complaints or symptoms of Hives, Itching. Integumentary (Skin) Complains or has symptoms of Wounds - hx, Breakdown - hx. Musculoskeletal Denies  complaints or symptoms of Muscle Pain, Muscle Weakness. Neurologic born spina bifida Psychiatric Denies complaints or symptoms of Anxiety, Claustrophobia. Objective Constitutional sitting or standing blood pressure is within target range for patient.. pulse regular and within target range for patient.Marland Kitchen respirations regular, non- labored and within target range for patient.Marland Kitchen temperature within target range for patient.. Well-nourished and well-hydrated in no acute distress. Vitals Time Taken: 1:50 PM, Temperature: 97.5 F, Pulse: 111 bpm, Respiratory Rate: 16 breaths/min, Blood Pressure: 115/74 mmHg. Eyes MARQUEZ, CEESAY ([id number]) conjunctiva clear no eyelid edema noted. pupils equal round and reactive to light and accommodation. Ears, Nose, Mouth, and Throat no gross abnormality of ear auricles or external auditory canals. normal hearing noted during conversation. mucus membranes moist. Respiratory normal breathing without difficulty. Musculoskeletal Patient unable to walk Secondary to paralysis from spina bifida that .Marland Kitchen Psychiatric this patient is able to make decisions and demonstrates good insight into disease process. Alert and Oriented x 3. pleasant and cooperative. General Notes: Upon inspection patient has a significant wound over the right ischial tuberosity location which again was evidence of osteomyelitis but that has been treated with a fairly good course of antibiotics oral at home IV in the hospital. Nonetheless at this point I do feel like the wound appears to be much cleaner than the pictures I saw looking through the hospital record but nonetheless this is still a very significant wound. I had a strong discussion with the patient and his sister today. If he is not off of this and continues to just sit in his chair 10 hours a day at a time this is never going to get better. I explained that if they can to take care of this at home there needs to be some determination of  how that is going to manage this. Again that is not a question that I can answer for them I can only give them the information for what they need to do. Integumentary (Hair, Skin) Wound #2 status is Open. Original cause of wound was Pressure Injury. The date acquired was: 09/03/2021. The wound is located on the Right Ischial Tuberosity. The wound measures 3cm length x 5cm width x 3cm depth; 11.781cm^2 area and 35.343cm^3 volume. There is bone, muscle, tendon, Fat Layer (Subcutaneous Tissue), and fascia exposed. There is undermining starting at 2:00 and ending at 5:00 with a maximum distance of 0.5cm. There is additional undermining and at 8:00 and ending at 10:00 with a maximum distance of 1.6cm. There is a large amount of serosanguineous drainage noted. There is small (1-33%) red,  pink granulation within the wound bed. There is a large (67-100%) amount of necrotic tissue within the wound bed including [place]. Assessment Active Problems ICD-10 Pressure ulcer of right buttock, stage 4 Spina bifida occulta Colostomy status Essential (primary) hypertension Atherosclerotic heart disease of native coronary artery without angina pectoris Procedures Wound #2 Pre-procedure diagnosis of Wound #2 is a Pressure Ulcer located on the Right Ischial Tuberosity . There was a Chemical/Enzymatic/Mechanical debridement performed by [name]., PA-C. With the following instrument(s): saline gauze. Other agent used was Gauze and saline. A time out was conducted at 14:37, prior to the start of the procedure. There was no bleeding. The procedure was tolerated well. Post Debridement Measurements: 3cm length x 5cm width x 3cm depth; 35.343cm^3 volume. Post debridement Stage noted as Category/Stage IV. Character of Wound/Ulcer Post Debridement is improved. Post procedure Diagnosis Wound #2: Same as Pre-Procedure Plan Follow-up Appointments: Return Appointment in 1 week. Nurse Visit as needed Bathing/  Shower/ Hygiene: Clean wound with Normal Saline or wound cleanser. - keep dressing dry or change after shower No tub bath. Off-Loading: Roho cushion for wheelchair - Sending order to get Hospital bed/mattress - Has at home [place], [place] ([id number]) Air fluidized (Group 3) Turn and reposition every 2 hours Additional Orders / Instructions: Follow Nutritious Diet and Increase Protein Intake Medications-Please add to medication list.: Other: - Pick up Dakins at pharmacy for wound care The following medication(s) was prescribed: Dakin's Solution miscellaneous 0.25 % solution Moisten gauze with Dakin's solution then wring out leaving gauze damp not saturated before packing daily into the wound as directed in clini starting 10/16/2021 WOUND #2: - Ischial Tuberosity Wound Laterality: Right Cleanser: [place] Ancillary Kit - 15 Day Supply (DME) (Generic) 1 x Per Day/15 Days Discharge Instructions: Use supplies as instructed; Kit contains: (15) Saline Bullets; (15) 3x3 Gauze; 15 pr Gloves Cleanser: Dakin 16 (oz) 0.25 1 x Per Day/15 Days Discharge Instructions: Use as directed. Cleanser: Normal Saline 1 x Per NWG/95 Days Discharge Instructions: Wash your hands with soap and water. Remove old dressing, discard into plastic bag and place into trash. Cleanse the wound with Normal Saline prior to applying a clean dressing using gauze sponges, not tissues or cotton balls. Do not scrub or use excessive force. Pat dry using gauze sponges, not tissue or cotton balls. Cleanser: Wound Cleanser (DME) (Generic) 1 x Per Day/15 Days Discharge Instructions: Wash your hands with soap and water. Remove old dressing, discard into plastic bag and place into trash. Cleanse the wound with Wound Cleanser prior to applying a clean dressing using gauze sponges, not tissues or cotton balls. Do not scrub or use excessive force. Pat dry using gauze sponges, not tissue or cotton balls. Topical: skin prep around wound 1 x Per  Day/15 Days Discharge Instructions: protect skin where tape is placed Primary Dressing: Gauze (DME) (Generic) 1 x Per Day/15 Days Discharge Instructions: Lightly soaked with DAKINS-As directed: dry, moistened with saline or moistened with Dakins Solution Secondary Dressing: ABD Pad 5x9 (in/in) (DME) (Generic) 1 x Per Day/15 Days Discharge Instructions: Cover with ABD pad Secured With: 44M Medipore H Soft Cloth Surgical Tape, 2x2 (in/yd) (DME) (Generic) 1 x Per Day/15 Days 1. Based on what I am seeing currently I Georgina Peer go ahead and initiate treatment with Dakin's moistened gauze dressings which I think is probably good to be the best way to go. I discussed this helps kill bacteria would probably be the best option bar none at this time. Especially with his  deep is the wound he has. 2. I am also can recommend that we continue with a ABD pad to cover secured with tape this needs to be changed once a day. 3. I am also going to suggest that the patient may need to go back and see infectious disease if things are not showing signs of improvement. Nonetheless right now I think that if the wound shows any signs of improvement especially if they are able to appropriately offload that we can continue with the plan as such utilizing the Dakin's solution moistened gauze packed into the wound changed once a day hopefully this will manage the situation quite nicely. 4. We are going to see about getting him an air mattress as well as a Roho cushion. This is neither 1 takes the place of good and appropriate offloading which would include turning or repositioning every 2 hours and not sitting for longer than 2 hours at a time in any 1 position. Nonetheless I am not certain this is even get a be possible as his sister works during the day and he typically gets up around 9 and does not go to bed until around 10 and I am told. We will see patient back for reevaluation in 1 week here in the clinic. If anything worsens  or changes patient will contact our office for additional recommendations. Electronic Signature(s) Signed: 10/16/2021 5:52:11 PM By: Worthy Keeler PA-C Entered By: Worthy Keeler on 10/16/2021 17:52:11 [name] ([id number]) -------------------------------------------------------------------------------- ROS/PFSH Details Patient Name: [name] Date of Service: 10/16/2021 1:00 PM Medical Record Number: [id number] Patient Account Number: [id number] Date of Birth/Sex: 19-Dec-1965 (55 y.o. M) Treating RN: Donnamarie Poag Primary Care Provider: Fulton Reek Other Clinician: Referring Provider: Jennye Boroughs Treating Provider/Extender: Skipper Cliche in Treatment: 0 Information Obtained From Patient Constitutional Symptoms (General Health) Complaints and Symptoms: Negative for: Fatigue; Fever; Chills; Marked Weight Change Ear/Nose/Mouth/Throat Complaints and Symptoms: Negative for: Difficult clearing ears; Sinusitis Medical History: Negative for: Chronic sinus problems/congestion; Middle ear problems Hematologic/Lymphatic Complaints and Symptoms: Negative for: Bleeding / Clotting Disorders; Human Immunodeficiency Virus Medical History: Negative for: Anemia; Hemophilia; Human Immunodeficiency Virus; Lymphedema; Sickle Cell Disease Respiratory Complaints and Symptoms: Negative for: Chronic or frequent coughs; Shortness of Breath Medical History: Negative for: Aspiration; Asthma; Chronic Obstructive Pulmonary Disease (COPD); Pneumothorax; Sleep Apnea; Tuberculosis Cardiovascular Complaints and Symptoms: Negative for: Chest pain; LE edema Medical History: Positive for: Coronary Artery Disease; Hypertension Negative for: Angina; Arrhythmia; Congestive Heart Failure; Deep Vein Thrombosis; Hypotension; Myocardial Infarction; Peripheral Arterial Disease; Peripheral Venous Disease; Phlebitis; Vasculitis Gastrointestinal Complaints and Symptoms: Negative for: Frequent  diarrhea; Nausea; Vomiting Review of System Notes: ileostomy Endocrine Complaints and Symptoms: Negative for: Hepatitis; Thyroid disease; Polydypsia (Excessive Thirst) Medical History: Negative for: Type I Diabetes Immunological Complaints and Symptoms: Negative for: Hives; Itching GREGOR, DERSHEM ([id number]) Medical History: Negative for: Lupus Erythematosus; Raynaudos; Scleroderma Integumentary (Skin) Complaints and Symptoms: Positive for: Wounds - hx; Breakdown - hx Medical History: Positive for: History of pressure wounds - hx Musculoskeletal Complaints and Symptoms: Negative for: Muscle Pain; Muscle Weakness Medical History: Positive for: Osteoarthritis Past Medical History Notes: spina bifida Psychiatric Complaints and Symptoms: Negative for: Anxiety; Claustrophobia Medical History: Negative for: Anorexia/bulimia; Confinement Anxiety Eyes Complaints and Symptoms: Review of System Notes: L eye mostly blind per pt Medical History: Positive for: Cataracts; Glaucoma Negative for: Optic Neuritis Genitourinary Complaints and Symptoms: Review of System Notes: urostomy bag Neurologic Complaints and Symptoms: Review of System Notes: born spina bifida Medical History: Positive for:  Paraplegia - spina bifida Past Medical History Notes: Spida Bifida Oncologic Medical History: Negative for: Received Chemotherapy; Received Radiation HBO Extended History Items Eyes: Eyes: Cataracts Glaucoma Immunizations Pneumococcal Vaccine: Received Pneumococcal Vaccination: No PAX, REASONER ([id number]) Implantable Devices None Family and Social History Cancer: Yes - Paternal Grandparents; Diabetes: Yes - Father,Mother; Heart Disease: No; Hypertension: Yes - Father,Mother; Kidney Disease: No; Lung Disease: Yes - Mother; Seizures: No; Stroke: Yes - Mother; Thyroid Problems: No; Tuberculosis: No; Never smoker; Marital Status - Single; Alcohol Use: Never; Drug Use: No  History; Financial Concerns: No; Food, Clothing or Shelter Needs: No; Support System Lacking: No; Transportation Concerns: No Electronic Signature(s) Signed: 10/16/2021 4:02:12 PM By: Donnamarie Poag Signed: 10/16/2021 5:59:23 PM By: Worthy Keeler PA-C Entered By: Donnamarie Poag on 10/16/2021 14:23:36 [name] ([id number]) -------------------------------------------------------------------------------- [place] Details Patient Name: [name] Date of Service: 10/16/2021 Medical Record Number: [id number] Patient Account Number: [id number] Date of Birth/Sex: [birth year] (56 y.o. M) Treating RN: Donnamarie Poag Primary Care Provider: Fulton Reek Other Clinician: Referring Provider: Jennye Boroughs Treating Provider/Extender: Skipper Cliche in Treatment: 0 Diagnosis Coding ICD-10 Codes Code Description L89.314 Pressure ulcer of right buttock, stage 4 Q76.0 Spina bifida occulta Z93.3 Colostomy status I10 Essential (primary) hypertension I25.10 Atherosclerotic heart disease of native coronary artery without angina pectoris Facility Procedures CPT4 Code: [id number] Description: 99214 - WOUND CARE VISIT-LEV 4 EST PT Modifier: Quantity: 1 Physician Procedures CPT4 Code: [id number] Description: 78588 - WC PHYS LEVEL 4 - NEW PT Modifier: Quantity: 1 CPT4 Code: Description: ICD-10 Diagnosis Description L89.314 Pressure ulcer of right buttock, stage 4 Q76.0 Spina bifida occulta Z93.3 Colostomy status I10 Essential (primary) hypertension Modifier: Quantity: Electronic Signature(s) Signed: 10/16/2021 5:52:51 PM By: Worthy Keeler PA-C Previous Signature: 10/16/2021 4:02:12 PM Version By: Donnamarie Poag Entered By: Worthy Keeler on 10/16/2021 17:52:50

## 2021-10-16 NOTE — Progress Notes (Signed)
DIRON, HADDON ([id number]) Visit Report for 10/16/2021 Allergy List Details Patient Name: [name], [name] Date of Service: 10/16/2021 1:00 PM Medical Record Number: [id number] Patient Account Number: [id number] Date of Birth/Sex: 08-11-1966 (56 y.o. M) Treating RN: Donnamarie Poag Primary Care Avarae Zwart: Fulton Reek Other Clinician: Referring Shadana Pry: Jennye Boroughs Treating Dayzha Pogosyan/Extender: Skipper Cliche in Treatment: 0 Allergies Active Allergies Sulfa (Sulfonamide Antibiotics) latex morphine Iodinated Contrast Media Severity: Severe Allergy Notes Electronic Signature(s) Signed: 10/16/2021 4:02:12 PM By: Donnamarie Poag Entered By: Donnamarie Poag on 10/16/2021 13:54:47 [name] ([id number]) -------------------------------------------------------------------------------- Arrival Information Details Patient Name: [name] Date of Service: 10/16/2021 1:00 PM Medical Record Number: [id number] Patient Account Number: [id number] Date of Birth/Sex: 1966-07-15 (55 y.o. M) Treating RN: Donnamarie Poag Primary Care Andriana Casa: Fulton Reek Other Clinician: Referring Kolt Mcwhirter: Jennye Boroughs Treating Mercedes Fort/Extender: Skipper Cliche in Treatment: 0 Visit Information Patient Arrived: Wheel Chair Arrival Time: 13:51 Accompanied By: EFE Transfer Assistance: Transfer Board Patient Identification Verified: Yes Secondary Verification Process Completed: Yes Patient Requires Transmission-Based Precautions: No Patient Has Alerts: Yes Patient Alerts: NOT diabetic History Since Last Visit Electronic Signature(s) Signed: 10/16/2021 4:02:12 PM By: Donnamarie Poag Entered By: Donnamarie Poag on 10/16/2021 13:52:10 [name] ([id number]) -------------------------------------------------------------------------------- Clinic Level of Care Assessment Details Patient Name: [name] Date of Service: 10/16/2021 1:00 PM Medical Record Number: [id number] Patient Account  Number: [id number] Date of Birth/Sex: 1966-03-19 (55 y.o. M) Treating RN: Donnamarie Poag Primary Care Osby Sweetin: Fulton Reek Other Clinician: Referring Kyston Gonce: Jennye Boroughs Treating Elide Stalzer/Extender: Skipper Cliche in Treatment: 0 Clinic Level of Care Assessment Items TOOL 2 Quantity Score '[]'  - Use when only an EandM is performed on the INITIAL visit 0 ASSESSMENTS - Nursing Assessment / Reassessment X - General Physical Exam (combine w/ comprehensive assessment (listed just below) when performed on new 1 20 pt. evals) X- 1 25 Comprehensive Assessment (HX, ROS, Risk Assessments, Wounds Hx, etc.) ASSESSMENTS - Wound and Skin Assessment / Reassessment X - Simple Wound Assessment / Reassessment - one wound 1 5 '[]'  - 0 Complex Wound Assessment / Reassessment - multiple wounds '[]'  - 0 Dermatologic / Skin Assessment (not related to wound area) ASSESSMENTS - Ostomy and/or Continence Assessment and Care '[]'  - Incontinence Assessment and Management 0 '[]'  - 0 Ostomy Care Assessment and Management (repouching, etc.) PROCESS - Coordination of Care X - Simple Patient / Family Education for ongoing care 1 15 '[]'  - 0 Complex (extensive) Patient / Family Education for ongoing care X- 1 10 Staff obtains Programmer, systems, Records, Test Results / Process Orders '[]'  - 0 Staff telephones HHA, Nursing Homes / Clarify orders / etc '[]'  - 0 Routine Transfer to another Facility (non-emergent condition) '[]'  - 0 Routine Hospital Admission (non-emergent condition) X- 1 15 New Admissions / Biomedical engineer / Ordering NPWT, Apligraf, etc. '[]'  - 0 Emergency Hospital Admission (emergent condition) X- 1 10 Simple Discharge Coordination '[]'  - 0 Complex (extensive) Discharge Coordination PROCESS - Special Needs '[]'  - Pediatric / Minor Patient Management 0 '[]'  - 0 Isolation Patient Management '[]'  - 0 Hearing / Language / Visual special needs '[]'  - 0 Assessment of Community assistance (transportation, D/C  planning, etc.) '[]'  - 0 Additional assistance / Altered mentation X- 1 15 Support Surface(s) Assessment (bed, cushion, seat, etc.) INTERVENTIONS - Wound Cleansing / Measurement X - Wound Imaging (photographs - any number of wounds) 1 5 '[]'  - 0 Wound Tracing (instead of photographs) X- 1 5 Simple Wound Measurement - one wound '[]'  - 0 Complex Wound Measurement - multiple wounds [name],  [name] ([id number]) X- 1 5 Simple Wound Cleansing - one wound '[]'  - 0 Complex Wound Cleansing - multiple wounds INTERVENTIONS - Wound Dressings '[]'  - Small Wound Dressing one or multiple wounds 0 X- 1 15 Medium Wound Dressing one or multiple wounds '[]'  - 0 Large Wound Dressing one or multiple wounds '[]'  - 0 Application of Medications - injection INTERVENTIONS - Miscellaneous '[]'  - External ear exam 0 '[]'  - 0 Specimen Collection (cultures, biopsies, blood, body fluids, etc.) '[]'  - 0 Specimen(s) / Culture(s) sent or taken to Lab for analysis X- 1 10 Patient Transfer (multiple staff / Harrel Lemon Lift / Similar devices) '[]'  - 0 Simple Staple / Suture removal (25 or less) '[]'  - 0 Complex Staple / Suture removal (26 or more) '[]'  - 0 Hypo / Hyperglycemic Management (close monitor of Blood Glucose) '[]'  - 0 Ankle / Brachial Index (ABI) - do not check if billed separately Has the patient been seen at the hospital within the last three years: Yes Total Score: 155 Level Of Care: New/Established - Level 4 Electronic Signature(s) Signed: 10/16/2021 4:02:12 PM By: Donnamarie Poag Entered By: Donnamarie Poag on 10/16/2021 14:59:47 [name] ([id number]) -------------------------------------------------------------------------------- Encounter Discharge Information Details Patient Name: [name] Date of Service: 10/16/2021 1:00 PM Medical Record Number: [id number] Patient Account Number: [id number] Date of Birth/Sex: 14-Mar-1966 (55 y.o. M) Treating RN: Donnamarie Poag Primary Care Rejeana Fadness: Fulton Reek Other  Clinician: Referring Adrine Hayworth: Jennye Boroughs Treating Trebor Galdamez/Extender: Skipper Cliche in Treatment: 0 Encounter Discharge Information Items Post Procedure Vitals Discharge Condition: Stable Temperature (F): 97.85 Ambulatory Status: Wheelchair Pulse (bpm): 74 Discharge Destination: Home Respiratory Rate (breaths/min): 16 Transportation: Private Auto Blood Pressure (mmHg): 115/74 Accompanied By: CAREGIVER Schedule Follow-up Appointment: Yes Clinical Summary of Care: Electronic Signature(s) Signed: 10/16/2021 4:02:12 PM By: Donnamarie Poag Entered By: Donnamarie Poag on 10/16/2021 15:08:04 [name] ([id number]) -------------------------------------------------------------------------------- Lower Extremity Assessment Details Patient Name: [name] Date of Service: 10/16/2021 1:00 PM Medical Record Number: [id number] Patient Account Number: [id number] Date of Birth/Sex: 1966-07-21 (55 y.o. M) Treating RN: Donnamarie Poag Primary Care Hajira Verhagen: Fulton Reek Other Clinician: Referring Irelyn Perfecto: Jennye Boroughs Treating Thorsten Climer/Extender: Jeri Cos Weeks in Treatment: 0 Electronic Signature(s) Signed: 10/16/2021 4:02:12 PM By: Donnamarie Poag Entered By: Donnamarie Poag on 10/16/2021 14:00:30 [name] ([id number]) -------------------------------------------------------------------------------- Multi Wound Chart Details Patient Name: [name] Date of Service: 10/16/2021 1:00 PM Medical Record Number: [id number] Patient Account Number: [id number] Date of Birth/Sex: [birth year] (55 y.o. M) Treating RN: Donnamarie Poag Primary Care Dalan Cowger: Fulton Reek Other Clinician: Referring Janit Cutter: Jennye Boroughs Treating Emeri [name]/Extender: Skipper Cliche in Treatment: 0 Vital Signs Height(in): Pulse(bpm): 111 Weight(lbs): Blood Pressure(mmHg): 115/74 Body Mass Index(BMI): Temperature(F): 97.5 Respiratory Rate(breaths/min): 16 Photos: [N/A:N/A] Wound  Location: Right Ischial Tuberosity N/A N/A Wounding Event: Pressure Injury N/A N/A Primary Etiology: Pressure Ulcer N/A N/A Comorbid History: Cataracts, Glaucoma, History of N/A N/A pressure wounds, Osteoarthritis, Paraplegia Date Acquired: 09/03/2021 N/A N/A Weeks of Treatment: 0 N/A N/A Wound Status: Open N/A N/A Measurements L x W x D (cm) 3x5x3 N/A N/A Area (cm) : 11.781 N/A N/A Volume (cm) : 35.343 N/A N/A Starting Position 1 (o'clock): 2 Ending Position 1 (o'clock): 5 Maximum Distance 1 (cm): 0.5 Starting Position 2 (o'clock): 8 Ending Position 2 (o'clock): 10 Maximum Distance 2 (cm): 1.6 Undermining: Yes N/A N/A Classification: Category/Stage IV N/A N/A Exudate Amount: Large N/A N/A Exudate Type: Serosanguineous N/A N/A Exudate Color: red, brown N/A N/A Granulation Amount: Small (1-33%) N/A N/A Granulation Quality: Red, Pink N/A N/A Necrotic Amount:  Large (67-100%) N/A N/A Exposed Structures: Fascia: Yes N/A N/A Fat Layer (Subcutaneous Tissue): Yes Tendon: Yes Muscle: Yes Joint: No Bone: No Treatment Notes Electronic Signature(s) Signed: 10/16/2021 4:02:12 PM By: Donnamarie Poag Entered By: Donnamarie Poag on 10/16/2021 14:13:12 [name] ([id number]) [name] ([id number]) -------------------------------------------------------------------------------- [place] Details Patient Name: [name] Date of Service: 10/16/2021 1:00 PM Medical Record Number: [id number] Patient Account Number: [id number] Date of Birth/Sex: March 21, 1966 (56 y.o. M) Treating RN: Donnamarie Poag Primary Care Amahd Morino: Fulton Reek Other Clinician: Referring Amayia Ciano: Jennye Boroughs Treating Tyasia Packard/Extender: Skipper Cliche in Treatment: 0 Active Inactive Orientation to the Wound Care Program Nursing Diagnoses: Knowledge deficit related to the wound healing center program Goals: Patient/caregiver will verbalize understanding of the [place] Program Date Initiated: 10/16/2021 Target Resolution Date: 11/06/2021 Goal Status: Active Interventions: Provide education on orientation to the wound center Notes: Pressure Nursing Diagnoses: Knowledge deficit related to causes and risk factors for pressure ulcer development Knowledge deficit related to management of pressures ulcers Potential for impaired tissue integrity related to pressure, friction, moisture, and shear Goals: Patient will remain free from development of additional pressure ulcers Date Initiated: 10/16/2021 Target Resolution Date: 11/27/2021 Goal Status: Active Patient/caregiver will verbalize risk factors for pressure ulcer development Date Initiated: 10/16/2021 Target Resolution Date: 11/13/2021 Goal Status: Active Interventions: Assess: immobility, friction, shearing, incontinence upon admission and as needed Assess offloading mechanisms upon admission and as needed Notes: Wound/Skin Impairment Nursing Diagnoses: Impaired tissue integrity Knowledge deficit related to smoking impact on wound healing Knowledge deficit related to ulceration/compromised skin integrity Goals: Patient/caregiver will verbalize understanding of skin care regimen Date Initiated: 10/16/2021 Target Resolution Date: 11/06/2021 Goal Status: Active Ulcer/skin breakdown will have a volume reduction of 30% by week 4 Date Initiated: 10/16/2021 Target Resolution Date: 11/13/2021 Goal Status: Active Ulcer/skin breakdown will have a volume reduction of 50% by week 8 Date Initiated: 10/16/2021 Target Resolution Date: 12/11/2021 Goal Status: Active [name], [name] ([id number]) Ulcer/skin breakdown will have a volume reduction of 80% by week 12 Date Initiated: 10/16/2021 Target Resolution Date: 01/08/2022 Goal Status: Active Ulcer/skin breakdown will heal within 14 weeks Date Initiated: 10/16/2021 Target Resolution Date: 01/22/2022 Goal Status: Active Interventions: Assess patient/caregiver  ability to obtain necessary supplies Assess patient/caregiver ability to perform ulcer/skin care regimen upon admission and as needed Assess ulceration(s) every visit Notes: Electronic Signature(s) Signed: 10/16/2021 4:02:12 PM By: Donnamarie Poag Entered By: Donnamarie Poag on 10/16/2021 14:12:59 [name] ([id number]) -------------------------------------------------------------------------------- Pain Assessment Details Patient Name: [name] Date of Service: 10/16/2021 1:00 PM Medical Record Number: [id number] Patient Account Number: [id number] Date of Birth/Sex: 04-Dec-1965 (56 y.o. M) Treating RN: Donnamarie Poag Primary Care Cataleia Gade: Fulton Reek Other Clinician: Referring Adelyne Marchese: Jennye Boroughs Treating Omran Keelin/Extender: Skipper Cliche in Treatment: 0 Active Problems Location of Pain Severity and Description of Pain Patient Has Paino No Site Locations Rate the pain. Current Pain Level: 0 Pain Management and Medication Current Pain Management: Notes states born with spina bifida with no sensation from waist down Electronic Signature(s) Signed: 10/16/2021 4:02:12 PM By: Donnamarie Poag Entered By: Donnamarie Poag on 10/16/2021 13:52:35 [name] ([id number]) -------------------------------------------------------------------------------- Patient/Caregiver Education Details Patient Name: [name] Date of Service: 10/16/2021 1:00 PM Medical Record Number: [id number] Patient Account Number: [id number] Date of Birth/Gender: [birth year] (55 y.o. M) Treating RN: Donnamarie Poag Primary Care Physician: Fulton Reek Other Clinician: Referring Physician: Jennye Boroughs Treating Physician/Extender: Skipper Cliche in Treatment: 0 Education Assessment Education Provided To: Patient and Caregiver Education Topics Provided Basic Hygiene: Nutrition: Offloading:  Pressure: Welcome To [facility]: Wound Debridement: Wound/Skin  Impairment: Electronic Signature(s) Signed: 10/16/2021 4:02:12 PM By: Donnamarie Poag Entered By: Donnamarie Poag on 10/16/2021 15:00:17 [name] ([id number]) -------------------------------------------------------------------------------- Wound Assessment Details Patient Name: [name] Date of Service: 10/16/2021 1:00 PM Medical Record Number: [id number] Patient Account Number: [id number] Date of Birth/Sex: Jan 28, 1966 (56 y.o. M) Treating RN: Donnamarie Poag Primary Care Ronnel Zuercher: Fulton Reek Other Clinician: Referring Keilon Ressel: Jennye Boroughs Treating Dain Laseter/Extender: Skipper Cliche in Treatment: 0 Wound Status Wound Number: 2 Primary Pressure Ulcer Etiology: Wound Location: Right Ischial Tuberosity Wound Open Wounding Event: Pressure Injury Status: Date Acquired: 09/03/2021 Comorbid Cataracts, Glaucoma, Coronary Artery Disease, Weeks Of Treatment: 0 History: Hypertension, History of pressure wounds, Osteoarthritis, Clustered Wound: No Paraplegia Photos Wound Measurements Length: (cm) 3 % Red Width: (cm) 5 % Red Depth: (cm) 3 Under Area: (cm) 11.781 Lo Volume: (cm) 35.343 Loc uction in Area: 0% uction in Volume: 0% mining: Yes cation 1 Starting Position (o'clock): 2 Ending Position (o'clock): 5 Maximum Distance: (cm) 0.5 ation 2 Starting Position (o'clock): 8 Ending Position (o'clock): 10 Maximum Distance: (cm) 1.6 Wound Description Classification: Category/Stage IV Foul Exudate Amount: Large Slou Exudate Type: Serosanguineous Exudate Color: red, brown Odor After Cleansing: No gh/Fibrino Yes Wound Bed Granulation Amount: Small (1-33%) Exposed Structure Granulation Quality: Red, Pink Fascia Exposed: Yes Necrotic Amount: Large (67-100%) Fat Layer (Subcutaneous Tissue) Exposed: Yes Necrotic Quality: Adherent Slough Tendon Exposed: Yes Muscle Exposed: Yes Necrosis of Muscle: No Joint Exposed: No Bone Exposed: Yes Treatment Notes [name], [name] ([id number]) Wound #2 (Ischial Tuberosity) Wound Laterality: Right Cleanser [place] Ancillary Kit - 15 Day Supply Discharge Instruction: Use supplies as instructed; Kit contains: (15) Saline Bullets; (15) 3x3 Gauze; 15 pr Gloves Dakin 16 (oz) 0.25 Discharge Instruction: Use as directed. Normal Saline Discharge Instruction: Wash your hands with soap and water. Remove old dressing, discard into plastic bag and place into trash. Cleanse the wound with Normal Saline prior to applying a clean dressing using gauze sponges, not tissues or cotton balls. Do not scrub or use excessive force. Pat dry using gauze sponges, not tissue or cotton balls. Wound Cleanser Discharge Instruction: Wash your hands with soap and water. Remove old dressing, discard into plastic bag and place into trash. Cleanse the wound with Wound Cleanser prior to applying a clean dressing using gauze sponges, not tissues or cotton balls. Do not scrub or use excessive force. Pat dry using gauze sponges, not tissue or cotton balls. Peri-Wound Care Topical skin prep around wound Discharge Instruction: protect skin where tape is placed Primary Dressing Gauze Discharge Instruction: Lightly soaked with DAKINS-As directed: dry, moistened with saline or moistened with Dakins Solution Secondary Dressing ABD Pad 5x9 (in/in) Discharge Instruction: Cover with ABD pad Secured With 62M Medipore H Soft Cloth Surgical Tape, 2x2 (in/yd) Compression Wrap Compression Stockings Add-Ons Electronic Signature(s) Signed: 10/16/2021 4:02:12 PM By: Donnamarie Poag Entered By: Donnamarie Poag on 10/16/2021 15:05:04 [name] ([id number]) -------------------------------------------------------------------------------- [place] Details Patient Name: [name] Date of Service: 10/16/2021 1:00 PM Medical Record Number: [id number] Patient Account Number: [id number] Date of Birth/Sex: December 15, 1965 (56 y.o. M) Treating RN: Donnamarie Poag Primary  Care Neriyah Cercone: Fulton Reek Other Clinician: Referring Avenell Sellers: Jennye Boroughs Treating Shenna Brissette/Extender: Skipper Cliche in Treatment: 0 Vital Signs Time Taken: 13:50 Temperature (F): 97.5 Unable to obtain height and weight: Medical Reason Pulse (bpm): 111 Respiratory Rate (breaths/min): 16 Blood Pressure (mmHg): 115/74 Reference Range: 80 - 120 mg / dl Electronic Signature(s) Signed: 10/16/2021 4:02:12 PM By: Lyndel Safe,  Joy Entered ByDonnamarie Poag on 10/16/2021 13:52:57

## 2021-10-22 DIAGNOSIS — H4053X3 Glaucoma secondary to other eye disorders, bilateral, severe stage: Secondary | ICD-10-CM | POA: Diagnosis not present

## 2021-10-26 ENCOUNTER — Encounter: Payer: Medicare HMO | Admitting: Physician Assistant

## 2021-10-26 ENCOUNTER — Other Ambulatory Visit: Payer: Self-pay

## 2021-10-26 DIAGNOSIS — I129 Hypertensive chronic kidney disease with stage 1 through stage 4 chronic kidney disease, or unspecified chronic kidney disease: Secondary | ICD-10-CM | POA: Diagnosis not present

## 2021-10-26 DIAGNOSIS — N183 Chronic kidney disease, stage 3 unspecified: Secondary | ICD-10-CM | POA: Diagnosis not present

## 2021-10-26 DIAGNOSIS — Z933 Colostomy status: Secondary | ICD-10-CM | POA: Diagnosis not present

## 2021-10-26 DIAGNOSIS — L89314 Pressure ulcer of right buttock, stage 4: Secondary | ICD-10-CM | POA: Diagnosis not present

## 2021-10-26 DIAGNOSIS — I10 Essential (primary) hypertension: Secondary | ICD-10-CM | POA: Diagnosis not present

## 2021-10-26 DIAGNOSIS — Q760 Spina bifida occulta: Secondary | ICD-10-CM | POA: Diagnosis not present

## 2021-10-26 NOTE — Progress Notes (Addendum)
MAKAVELI, HOARD ([id number]) Visit Report for 10/26/2021 Arrival Information Details Patient Name: [name], [name] Date of Service: 10/26/2021 8:30 AM Medical Record Number: [id number] Patient Account Number: [id number] Date of Birth/Sex: 1966-04-15 (56 y.o. M) Treating RN: Donnamarie Poag Primary Care Larena Ohnemus: Fulton Reek Other Clinician: Referring Jayen Bromwell: Fulton Reek Treating Gianni Mihalik/Extender: Skipper Cliche in Treatment: 1 Visit Information History Since Last Visit Added or deleted any medications: No Patient Arrived: Wheel Chair Had a fall or experienced change in No Arrival Time: 08:40 activities of daily living that may affect Accompanied By: caregiver risk of falls: Transfer Assistance: Transfer Board Hospitalized since last visit: No Patient Identification Verified: Yes Has Dressing in Place as Prescribed: Yes Secondary Verification Process Completed: Yes Pain Present Now: No Patient Requires Transmission-Based Precautions: No Patient Has Alerts: Yes Patient Alerts: NOT diabetic Electronic Signature(s) Signed: 10/26/2021 4:33:32 PM By: Donnamarie Poag Entered By: Donnamarie Poag on 10/26/2021 08:47:21 [name] ([id number]) -------------------------------------------------------------------------------- Clinic Level of Care Assessment Details Patient Name: [name] Date of Service: 10/26/2021 8:30 AM Medical Record Number: [id number] Patient Account Number: [id number] Date of Birth/Sex: Jul 04, 1966 (55 y.o. M) Treating RN: Donnamarie Poag Primary Care Michal Callicott: Fulton Reek Other Clinician: Referring Cohen Boettner: Fulton Reek Treating Gelisa Tieken/Extender: Skipper Cliche in Treatment: 1 Clinic Level of Care Assessment Items TOOL 4 Quantity Score []  - Use when only an EandM is performed on FOLLOW-UP visit 0 ASSESSMENTS - Nursing Assessment / Reassessment []  - Reassessment of Co-morbidities (includes updates in patient status) 0 []  -  0 Reassessment of Adherence to Treatment Plan ASSESSMENTS - Wound and Skin Assessment / Reassessment X - Simple Wound Assessment / Reassessment - one wound 1 5 []  - 0 Complex Wound Assessment / Reassessment - multiple wounds []  - 0 Dermatologic / Skin Assessment (not related to wound area) ASSESSMENTS - Focused Assessment []  - Circumferential Edema Measurements - multi extremities 0 []  - 0 Nutritional Assessment / Counseling / Intervention []  - 0 Lower Extremity Assessment (monofilament, tuning fork, pulses) []  - 0 Peripheral Arterial Disease Assessment (using hand held doppler) ASSESSMENTS - Ostomy and/or Continence Assessment and Care []  - Incontinence Assessment and Management 0 []  - 0 Ostomy Care Assessment and Management (repouching, etc.) PROCESS - Coordination of Care X - Simple Patient / Family Education for ongoing care 1 15 []  - 0 Complex (extensive) Patient / Family Education for ongoing care X- 1 10 Staff obtains Consents, Records, Test Results / Process Orders []  - 0 Staff telephones HHA, Nursing Homes / Clarify orders / etc []  - 0 Routine Transfer to another Facility (non-emergent condition) []  - 0 Routine Hospital Admission (non-emergent condition) []  - 0 New Admissions / Biomedical engineer / Ordering NPWT, Apligraf, etc. []  - 0 Emergency Hospital Admission (emergent condition) X- 1 10 Simple Discharge Coordination []  - 0 Complex (extensive) Discharge Coordination PROCESS - Special Needs []  - Pediatric / Minor Patient Management 0 []  - 0 Isolation Patient Management []  - 0 Hearing / Language / Visual special needs []  - 0 Assessment of Community assistance (transportation, D/C planning, etc.) []  - 0 Additional assistance / Altered mentation X- 1 15 Support Surface(s) Assessment (bed, cushion, seat, etc.) INTERVENTIONS - Wound Cleansing / Measurement [name], [name] ([id number]) X- 1 5 Simple Wound Cleansing - one wound []  - 0 Complex Wound  Cleansing - multiple wounds X- 1 5 Wound Imaging (photographs - any number of wounds) []  - 0 Wound Tracing (instead of photographs) X- 1 5 Simple Wound Measurement - one wound []  - 0 Complex Wound Measurement -  multiple wounds INTERVENTIONS - Wound Dressings []  - Small Wound Dressing one or multiple wounds 0 []  - 0 Medium Wound Dressing one or multiple wounds X- 1 20 Large Wound Dressing one or multiple wounds []  - 0 Application of Medications - topical []  - 0 Application of Medications - injection INTERVENTIONS - Miscellaneous []  - External ear exam 0 []  - 0 Specimen Collection (cultures, biopsies, blood, body fluids, etc.) []  - 0 Specimen(s) / Culture(s) sent or taken to Lab for analysis []  - 0 Patient Transfer (multiple staff / Harrel Lemon Lift / Similar devices) []  - 0 Simple Staple / Suture removal (25 or less) []  - 0 Complex Staple / Suture removal (26 or more) []  - 0 Hypo / Hyperglycemic Management (close monitor of Blood Glucose) []  - 0 Ankle / Brachial Index (ABI) - do not check if billed separately X- 1 5 Vital Signs Has the patient been seen at the hospital within the last three years: Yes Total Score: 95 Level Of Care: New/Established - Level 3 Electronic Signature(s) Signed: 10/26/2021 4:33:32 PM By: Donnamarie Poag Entered By: Donnamarie Poag on 10/26/2021 09:11:01 [name] ([id number]) -------------------------------------------------------------------------------- Encounter Discharge Information Details Patient Name: [name] Date of Service: 10/26/2021 8:30 AM Medical Record Number: [id number] Patient Account Number: [id number] Date of Birth/Sex: [birth year] (56 y.o. M) Treating RN: Donnamarie Poag Primary Care Mazel Villela: Fulton Reek Other Clinician: Referring Juwaun Inskeep: Fulton Reek Treating Gelsey Amyx/Extender: Skipper Cliche in Treatment: 1 Encounter Discharge Information Items Discharge Condition: Stable Ambulatory Status:  Wheelchair Discharge Destination: Home Transportation: Private Auto Accompanied By: CAREGIVER Schedule Follow-up Appointment: Yes Clinical Summary of Care: Electronic Signature(s) Signed: 10/26/2021 4:33:32 PM By: Donnamarie Poag Entered By: Donnamarie Poag on 10/26/2021 09:21:27 [name] ([id number]) -------------------------------------------------------------------------------- Lower Extremity Assessment Details Patient Name: [name] Date of Service: 10/26/2021 8:30 AM Medical Record Number: [id number] Patient Account Number: [id number] Date of Birth/Sex: 07-17-1966 (55 y.o. M) Treating RN: Donnamarie Poag Primary Care Paullette Mckain: Fulton Reek Other Clinician: Referring Rainie Crenshaw: Fulton Reek Treating Acie Custis/Extender: Skipper Cliche in Treatment: 1 Electronic Signature(s) Signed: 10/26/2021 4:33:32 PM By: Donnamarie Poag Entered By: Donnamarie Poag on 10/26/2021 08:58:56 [name] ([id number]) -------------------------------------------------------------------------------- Multi Wound Chart Details Patient Name: [name] Date of Service: 10/26/2021 8:30 AM Medical Record Number: [id number] Patient Account Number: [id number] Date of Birth/Sex: January 05, 1966 (55 y.o. M) Treating RN: Donnamarie Poag Primary Care Skyelyn Scruggs: Fulton Reek Other Clinician: Referring Adie Vilar: Fulton Reek Treating Darnell Stimson/Extender: Skipper Cliche in Treatment: 1 Vital Signs Height(in): Pulse(bpm): 21 Weight(lbs): Blood Pressure(mmHg): 103/71 Body Mass Index(BMI): Temperature(F): 98.2 Respiratory Rate(breaths/min): 16 Photos: [N/A:N/A] Wound Location: Right Ischial Tuberosity N/A N/A Wounding Event: Pressure Injury N/A N/A Primary Etiology: Pressure Ulcer N/A N/A Comorbid History: Cataracts, Glaucoma, Coronary N/A N/A Artery Disease, Hypertension, History of pressure wounds, Osteoarthritis, Paraplegia Date Acquired: 09/03/2021 N/A N/A Weeks of Treatment: 1 N/A  N/A Wound Status: Open N/A N/A Wound Recurrence: No N/A N/A Measurements L x W x D (cm) 3.2x5.2x3 N/A N/A Area (cm) : 13.069 N/A N/A Volume (cm) : 39.207 N/A N/A % Reduction in Area: -10.90% N/A N/A % Reduction in Volume: -10.90% N/A N/A Starting Position 1 (o'clock): 5 Ending Position 1 (o'clock): 7 Maximum Distance 1 (cm): 0.5 Starting Position 2 (o'clock): Ending Position 2 (o'clock): Undermining: Yes N/A N/A Classification: Category/Stage IV N/A N/A Exudate Amount: Large N/A N/A Exudate Type: Serosanguineous N/A N/A Exudate Color: red, brown N/A N/A Granulation Amount: Small (1-33%) N/A N/A Granulation Quality: Red, Pink N/A N/A Necrotic Amount: Large (67-100%) N/A N/A Exposed Structures:  Fascia: Yes N/A N/A Fat Layer (Subcutaneous Tissue): Yes Tendon: Yes Muscle: Yes Bone: Yes Joint: No Treatment Notes Electronic Signature(s) [name], [name] ([id number]) Signed: 10/26/2021 4:33:32 PM By: Donnamarie Poag Entered By: Donnamarie Poag on 10/26/2021 09:08:11 [name] ([id number]) -------------------------------------------------------------------------------- [place] Details Patient Name: [name] Date of Service: 10/26/2021 8:30 AM Medical Record Number: [id number] Patient Account Number: [id number] Date of Birth/Sex: [birth year] (55 y.o. M) Treating RN: Donnamarie Poag Primary Care Llesenia Fogal: Fulton Reek Other Clinician: Referring Tenia Goh: Fulton Reek Treating Tangelia Sanson/Extender: Skipper Cliche in Treatment: 1 Active Inactive Electronic Signature(s) Signed: 12/02/2021 4:15:08 PM By: Gretta Cool, BSN, RN, CWS, Kim RN, BSN Signed: 12/03/2021 3:51:37 PM By: Donnamarie Poag Previous Signature: 10/26/2021 4:33:32 PM Version By: Donnamarie Poag Entered By: Gretta Cool BSN, RN, CWS, Kim on 12/02/2021 16:15:08 [name] ([id number]) -------------------------------------------------------------------------------- Pain Assessment Details Patient Name:  [name] Date of Service: 10/26/2021 8:30 AM Medical Record Number: [id number] Patient Account Number: [id number] Date of Birth/Sex: 02/08/1966 (56 y.o. M) Treating RN: Donnamarie Poag Primary Care Ilani Otterson: Fulton Reek Other Clinician: Referring [place] Warriner: Fulton Reek Treating Creed Kail/Extender: Skipper Cliche in Treatment: 1 Active Problems Location of Pain Severity and Description of Pain Patient Has Paino No Site Locations Rate the pain. Current Pain Level: 0 Pain Management and Medication Current Pain Management: Electronic Signature(s) Signed: 10/26/2021 4:33:32 PM By: Donnamarie Poag Entered By: Donnamarie Poag on 10/26/2021 08:55:04 [name] ([id number]) -------------------------------------------------------------------------------- Patient/Caregiver Education Details Patient Name: [name] Date of Service: 10/26/2021 8:30 AM Medical Record Number: [id number] Patient Account Number: [id number] Date of Birth/Gender: 1966/09/04 (55 y.o. M) Treating RN: Donnamarie Poag Primary Care Physician: Fulton Reek Other Clinician: Referring Physician: Fulton Reek Treating Physician/Extender: Skipper Cliche in Treatment: 1 Education Assessment Education Provided To: Patient and Caregiver Education Topics Provided Basic Hygiene: Offloading: Pressure: Wound/Skin Impairment: Electronic Signature(s) Signed: 10/26/2021 4:33:32 PM By: Donnamarie Poag Entered By: Donnamarie Poag on 10/26/2021 09:11:47 [name] ([id number]) -------------------------------------------------------------------------------- Wound Assessment Details Patient Name: [name] Date of Service: 10/26/2021 8:30 AM Medical Record Number: [id number] Patient Account Number: [id number] Date of Birth/Sex: 08-03-1966 (55 y.o. M) Treating RN: Donnamarie Poag Primary Care Mayrene Bastarache: Fulton Reek Other Clinician: Referring Lurena Naeve: Fulton Reek Treating Stephannie Broner/Extender: Skipper Cliche in Treatment: 1 Wound Status Wound Number: 2 Primary Pressure Ulcer Etiology: Wound Location: Right Ischial Tuberosity Wound Open Wounding Event: Pressure Injury Status: Date Acquired: 09/03/2021 Comorbid Cataracts, Glaucoma, Coronary Artery Disease, Weeks Of Treatment: 1 History: Hypertension, History of pressure wounds, Osteoarthritis, Clustered Wound: No Paraplegia Photos Wound Measurements Length: (cm) 3.2 Width: (cm) 5.2 Depth: (cm) 3 Area: (cm) 13.069 Volume: (cm) 39.207 % Reduction in Area: -10.9% % Reduction in Volume: -10.9% Tunneling: No Undermining: Yes Location 1 Starting Position (o'clock): 5 Ending Position (o'clock): 7 Maximum Distance: (cm) 0.5 Location 2 Wound Description Classification: Category/Stage IV Exudate Amount: Large Exudate Type: Serosanguineous Exudate Color: red, brown Foul Odor After Cleansing: No Slough/Fibrino Yes Wound Bed Granulation Amount: Small (1-33%) Exposed Structure Granulation Quality: Red, Pink Fascia Exposed: Yes Necrotic Amount: Large (67-100%) Fat Layer (Subcutaneous Tissue) Exposed: Yes Necrotic Quality: Adherent Slough Tendon Exposed: Yes Muscle Exposed: Yes Necrosis of Muscle: No Joint Exposed: No Bone Exposed: Yes Electronic Signature(s) Signed: 10/26/2021 4:33:32 PM By: Donnamarie Poag Entered ByDonnamarie Poag on 10/26/2021 08:58:41 [name] ([id number]) [name] ([id number]) -------------------------------------------------------------------------------- [place] Details Patient Name: [name] Date of Service: 10/26/2021 8:30 AM Medical Record Number: [id number] Patient Account Number: [id number] Date of Birth/Sex: 11/20/1965 (55 y.o. M) Treating RN: Donnamarie Poag Primary Care Analiza Cowger: Doy Hutching,  Dellis Filbert Other Clinician: Referring Arden Tinoco: Fulton Reek Treating Starletta Houchin/Extender: Skipper Cliche in Treatment: 1 Vital Signs Time Taken: 08:45 Temperature (F):  98.2 Pulse (bpm): 98 Respiratory Rate (breaths/min): 16 Blood Pressure (mmHg): 103/71 Reference Range: 80 - 120 mg / dl Electronic Signature(s) Signed: 10/26/2021 4:33:32 PM By: Donnamarie Poag Entered ByDonnamarie Poag on 10/26/2021 08:51:31

## 2021-10-26 NOTE — Progress Notes (Addendum)
BROWNING, SOUTHWOOD ([id number]) Visit Report for 10/26/2021 Chief Complaint Document Details Patient Name: [name], [name] Date of Service: 10/26/2021 8:30 AM Medical Record Number: [id number] Patient Account Number: [id number] Date of Birth/Sex: 1966-09-15 (56 y.o. M) Treating RN: Donnamarie Poag Primary Care Provider: Fulton Reek Other Clinician: Referring Provider: Fulton Reek Treating Provider/Extender: Skipper Cliche in Treatment: 1 Information Obtained from: Patient Chief Complaint Right ischial tuberosity pressure ulcer Electronic Signature(s) Signed: 10/26/2021 9:04:13 AM By: Worthy Keeler PA-C Entered By: Worthy Keeler on 10/26/2021 09:04:12 [name] ([id number]) -------------------------------------------------------------------------------- HPI Details Patient Name: [name] Date of Service: 10/26/2021 8:30 AM Medical Record Number: [id number] Patient Account Number: [id number] Date of Birth/Sex: [birth year] (55 y.o. M) Treating RN: Donnamarie Poag Primary Care Provider: Fulton Reek Other Clinician: Referring Provider: Fulton Reek Treating Provider/Extender: Skipper Cliche in Treatment: 1 History of Present Illness Location: open ulcer on the left gluteal region near his thigh Quality: Patient reports No Pain. Severity: Patient states wound are getting worse. Duration: Patient has had the wound for > 3 months prior to seeking treatment at the wound center Context: The wound appeared gradually over time Modifying Factors: Consults to this date include:doxycycline which was prescribed by the ER physician at [facility] Signs and Symptoms: Patient reports having fever. HPI Description: 56 year old male with a history of spina bifida presenting to [place] with a history of a open wound on the left gluteal region near his upper thigh which she's had for several weeks. He was seen in the ED recently at [place] and was put on  doxycycline and was advised some local care. his past medical history is significant for spinal bifid, neurogenic bladder, kidney stones, sacral pressure sores, constipation. Past surgical history significant for partial cystectomy, ileal conduit, VP shunt removal, percutaneous nephro lithotripsy. In the remote past the patient says he's had some treatment for a ulcer on this area and was treated with a skin graft. Readmission: 10/16/2021 upon evaluation today patient appears to be doing somewhat poorly in regard to a fairly large wound noted over the right ischial tuberosity location. This is a stage IV pressure ulcer. Of note this is also positive for osteomyelitis upon a CT scan which was performed during the time that he was admitted in the hospital on 09/19/2021. With that being said it is noted in the right inferior buttock that he has a decubitus ulcer which extends to the posterior toe inferior right ischium where there is evidence of osteomyelitis. When he was here in the office actually missed that this was positive I missed read it and thought it said that there was no osteomyelitis. That is not the case there is in fact osteomyelitis noted here. I actually did review his notes as well in epic. Looking to the discharge summary it appears that the patient was admitted from 09/19/2021 through 09/23/2021. He was discharged home with home health care according to the note. With that being said from what I understand his sister actually takes care of him at this point. He was also to follow-up with a primary care provider and here at the wound care center within a week. I am just now seeing him on the 13th almost a month after discharge. He does have a history of again osteomyelitis of this right hip/ischial tuberosity location. 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 The reason for his admission was actually worsening of the decubitus ulcer upon admission. He does have chronic osteomyelitis of this area. He was treated with empiric antibiotics he had acute kidney injury which improved with IV fluids he also had hypokalemia. Surgical debridement was performed by general surgery at that time and ID was consulted to assist with management according to the notes. IV antibiotics were recommended but patient declined and wanted oral antibiotics at discharge. Therefore no IV antibiotics were planned for discharge time. This case was under the care of Dr. Steva Ready while he was in the hospital when she did discharge him with a 2-week course of doxycycline and Augmentin according to the notes. It looks like Dr. Doy Hutching office did call and leave a message for the patient to get scheduled for a follow-up visit after hospital discharge I do not see where he showed up in fact it appears that the patient has a no- show letter from Dr. Doy Hutching office noted in epic due to a appointment that was missed on 10/06/2021. Looking at the pictures from Dr. Raelene Bott notes on 09/23/2021 it does appear that the wound is a little bit better as far as the cleanliness of the surface of the wound today but nonetheless still is a very significant wound. 10/26/2021 upon evaluation today patient appears to be doing okay in regard to his wound. Again this is a wound that he did indeed have osteomyelitis and previous. With that being said I do not see any signs of a worsening infection at this time which is good news. Overall I think that we are headed in the right direction although still I think he needs more aggressive offloading. 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 I do believe this would be ideal for him to be honest. Electronic Signature(s) Signed: 10/26/2021 10:23:19 AM By: Worthy Keeler PA-C Entered By: Worthy Keeler on 10/26/2021  10:23:19 [name] ([id number]) -------------------------------------------------------------------------------- Physical Exam Details Patient Name: [name] Date of Service: 10/26/2021 8:30 AM Medical Record Number: [id number] Patient Account Number: [id number] Date of Birth/Sex: 1966-03-04 (55 y.o. M) Treating RN: Donnamarie Poag Primary Care Provider: Fulton Reek Other Clinician: Referring Provider: Fulton Reek Treating Provider/Extender: Skipper Cliche in Treatment: 1 Constitutional Well-nourished and well-hydrated in no acute distress. Respiratory normal breathing without difficulty. Psychiatric this patient is able to make decisions and demonstrates good insight into disease process. Alert and Oriented x 3. pleasant and cooperative. Notes Upon inspection patient showed signs of some good granulation areas and still some areas of necrotic tissue and slough which again is quite significant as far as the size of the wound is concerned. This appeared to be very macerated and again she just been put a piece of gauze over top of the Dakin's moistened gauze packing. Again she really needs something a little bit more absorptive which would be ideal. Electronic Signature(s) Signed: 10/26/2021 10:24:09 AM By: Worthy Keeler PA-C Entered By: Worthy Keeler on 10/26/2021 10:24:09 [name] ([id number]) -------------------------------------------------------------------------------- Physician Orders Details Patient Name: [name] Date of Service: 10/26/2021 8:30 AM Medical Record Number: [id number] Patient Account Number: [id number] Date of Birth/Sex: 01/28/1966 (55 y.o. M) Treating RN: Donnamarie Poag Primary Care Provider: Fulton Reek Other Clinician: Referring Provider: Fulton Reek Treating Provider/Extender: Skipper Cliche in Treatment: 1 Verbal / Phone Orders: No Diagnosis Coding ICD-10 Coding Code Description L89.314 Pressure ulcer  of right buttock, stage 4 Q76.0 Spina bifida  occulta Z93.3 Colostomy status I10 Essential (primary) hypertension I25.10 Atherosclerotic heart disease of native coronary artery without angina pectoris Follow-up Appointments o Return Appointment in 1 week. o Nurse Visit as needed Bathing/ Shower/ Hygiene o Clean wound with Normal Saline or wound cleanser. - keep dressing dry or change after shower o No tub bath. Off-Loading o Roho cushion for wheelchair - Sending order to get this to [facility] bed/mattress - Has at home o Air fluidized (Group 3) - Order sent to [employer] and reposition every 2 hours Additional Orders / Instructions o Follow Nutritious Diet and Increase Protein Intake Wound Treatment Wound #2 - Ischial Tuberosity Wound Laterality: Right Cleanser: Normal Saline 1 x Per Day/15 Days Discharge Instructions: Wash your hands with soap and water. Remove old dressing, discard into plastic bag and place into trash. Cleanse the wound with Normal Saline prior to applying a clean dressing using gauze sponges, not tissues or cotton balls. Do not scrub or use excessive force. Pat dry using gauze sponges, not tissue or cotton balls. Cleanser: Wound Cleanser 1 x Per Day/15 Days Discharge Instructions: Wash your hands with soap and water. Remove old dressing, discard into plastic bag and place into trash. Cleanse the wound with Wound Cleanser prior to applying a clean dressing using gauze sponges, not tissues or cotton balls. Do not scrub or use excessive force. Pat dry using gauze sponges, not tissue or cotton balls. Primary Dressing: Gauze 1 x Per DVV/61 Days Discharge Instructions: Lightly moisten gauze with DAKINS-As directed: dry, moistened with saline or moistened with Dakins Solution Secondary Dressing: ABD Pad 5x9 (in/in) 1 x Per Day/15 Days Discharge Instructions: Cover with ABD pad Electronic Signature(s) Signed: 10/26/2021 4:33:32 PM By: Donnamarie Poag Signed: 10/26/2021 4:34:41 PM By: Worthy Keeler PA-C Entered By: Donnamarie Poag on 10/26/2021 09:14:03 [name] ([id number]) -------------------------------------------------------------------------------- Problem List Details Patient Name: [name] Date of Service: 10/26/2021 8:30 AM Medical Record Number: [id number] Patient Account Number: [id number] Date of Birth/Sex: 27-Sep-1966 (56 y.o. M) Treating RN: Donnamarie Poag Primary Care Provider: Fulton Reek Other Clinician: Referring Provider: Fulton Reek Treating Provider/Extender: Skipper Cliche in Treatment: 1 Active Problems ICD-10 Encounter Code Description Active Date MDM Diagnosis L89.314 Pressure ulcer of right buttock, stage 4 10/16/2021 No Yes Q76.0 Spina bifida occulta 10/16/2021 No Yes Z93.3 Colostomy status 10/16/2021 No Yes I10 Essential (primary) hypertension 10/16/2021 No Yes I25.10 Atherosclerotic heart disease of native coronary artery without angina 10/16/2021 No Yes pectoris Inactive Problems Resolved Problems Electronic Signature(s) Signed: 10/26/2021 9:04:08 AM By: Worthy Keeler PA-C Entered By: Worthy Keeler on 10/26/2021 09:04:08 [name] ([id number]) -------------------------------------------------------------------------------- Progress Note Details Patient Name: [name] Date of Service: 10/26/2021 8:30 AM Medical Record Number: [id number] Patient Account Number: [id number] Date of Birth/Sex: [birth year] (55 y.o. M) Treating RN: Donnamarie Poag Primary Care Provider: Fulton Reek Other Clinician: Referring Provider: Fulton Reek Treating Provider/Extender: Skipper Cliche in Treatment: 1 Subjective Chief Complaint Information obtained from Patient Right ischial tuberosity pressure ulcer History of Present Illness (HPI) The following HPI elements were documented for the patient's wound: Location: open ulcer on the left gluteal region near his  thigh Quality: Patient reports No Pain. Severity: Patient states wound are getting worse. Duration: Patient has had the wound for > 3 months prior to seeking treatment at the wound center Context: The wound appeared gradually over time Modifying Factors: Consults to this date include:doxycycline which was prescribed by the ER physician at [facility] Signs and Symptoms: Patient reports  having fever. 56 year old male with a history of spina bifida presenting to [place] with a history of a open wound on the left gluteal region near his upper thigh which she's had for several weeks. He was seen in the ED recently at [facility] and was put on doxycycline and was advised some local care. his past medical history is significant for spinal bifid, neurogenic bladder, kidney stones, sacral pressure sores, constipation. Past surgical history significant for partial cystectomy, ileal conduit, VP shunt removal, percutaneous nephro lithotripsy. In the remote past the patient says he's had some treatment for a ulcer on this area and was treated with a skin graft. Readmission: 10/16/2021 upon evaluation today patient appears to be doing somewhat poorly in regard to a fairly large wound noted over the right ischial tuberosity location. This is a stage IV pressure ulcer. Of note this is also positive for osteomyelitis upon a CT scan which was performed during the time that he was admitted in the hospital on 09/19/2021. With that being said it is noted in the right inferior buttock that he has a decubitus ulcer which extends to the posterior toe inferior right ischium where there is evidence of osteomyelitis. When he was here in the office actually missed that this was positive I missed read it and thought it said that there was no osteomyelitis. That is not the case there is in fact osteomyelitis noted here. I actually did review his notes as well in epic. Looking to the discharge summary it appears that  the patient was admitted from 09/19/2021 through 09/23/2021. He was discharged home with home health care according to the note. With that being said from what I understand his sister actually takes care of him at this point. He was also to follow-up with a primary care provider and here at the wound care center within a week. I am just now seeing him on the 13th almost a month after discharge. He does have a history of again osteomyelitis of this right hip/ischial tuberosity location. 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 The reason for his admission was actually worsening of the decubitus ulcer upon admission. He does have chronic osteomyelitis of this area. He was treated with empiric antibiotics he had acute kidney injury which improved with IV fluids he also had hypokalemia. Surgical debridement was performed by general surgery at that time and ID was consulted to assist with management according to the notes. IV antibiotics were recommended but patient declined and wanted oral antibiotics at discharge. Therefore no IV antibiotics were planned for discharge time. This case was under the care of Dr. Steva Ready while he was in the hospital when she did discharge him with a 2-week course of doxycycline and Augmentin according to the notes. It looks like Dr. Doy Hutching office did call and leave a message for the patient to get scheduled for a follow-up visit after hospital discharge I do not see where he showed up in fact it appears that the patient has a no- show letter from Dr. Doy Hutching office noted in epic due to a appointment that was missed on 10/06/2021. Looking at the pictures from Dr. Raelene Bott notes on 09/23/2021 it does appear that the wound is a little bit better as far as the cleanliness of the surface of the wound today but nonetheless still  is a very significant wound. 10/26/2021 upon evaluation today patient appears to be doing okay in regard to his wound. Again this is a wound that he did indeed have osteomyelitis and previous. With that being said I do not see any signs of a worsening infection at this time which is good news. Overall I think that we are headed in the right direction although still I think he needs more aggressive offloading. 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 I do believe this would be ideal for him to be honest. Objective Constitutional Well-nourished and well-hydrated in no acute distress. [place], [name] ([id number]) Vitals Time Taken: 8:45 AM, Temperature: 98.2 F, Pulse: 98 bpm, Respiratory Rate: 16 breaths/min, Blood Pressure: 103/71 mmHg. Respiratory normal breathing without difficulty. Psychiatric this patient is able to make decisions and demonstrates good insight into disease process. Alert and Oriented x 3. pleasant and cooperative. General Notes: Upon inspection patient showed signs of some good granulation areas and still some areas of necrotic tissue and slough which again is quite significant as far as the size of the wound is concerned. This appeared to be very macerated and again she just been put a piece of gauze over top of the Dakin's moistened gauze packing. Again she really needs something a little bit more absorptive which would be ideal. Integumentary (Hair, Skin) Wound #2 status is Open. Original cause of wound was Pressure Injury. The date acquired was: 09/03/2021. The wound has been in treatment 1 weeks. The wound is located on the Right Ischial Tuberosity. The wound measures 3.2cm length x 5.2cm width x 3cm depth; 13.069cm^2 area and 39.207cm^3 volume. There is bone, muscle, tendon, Fat Layer (Subcutaneous Tissue), and fascia exposed. There is no tunneling noted, however, there is undermining starting at 5:00 and ending at 7:00 with a maximum distance  of 0.5cm. There is additional undermining and at :00 and ending at :00. There is a large amount of serosanguineous drainage noted. There is small (1-33%) red, pink granulation within the wound bed. There is a large (67-100%) amount of necrotic tissue within the wound bed including Adherent Slough. Assessment Active Problems ICD-10 Pressure ulcer of right buttock, stage 4 Spina bifida occulta Colostomy status Essential (primary) hypertension Atherosclerotic heart disease of native coronary artery without angina pectoris Plan Follow-up Appointments: Return Appointment in 1 week. Nurse Visit as needed Bathing/ Shower/ Hygiene: Clean wound with Normal Saline or wound cleanser. - keep dressing dry or change after shower No tub bath. Off-Loading: Roho cushion for wheelchair - Sending order to get this to [facility] bed/mattress - Has at home Air fluidized (Group 3) - Order sent to [employer] and reposition every 2 hours Additional Orders / Instructions: Follow Nutritious Diet and Increase Protein Intake WOUND #2: - Ischial Tuberosity Wound Laterality: Right Cleanser: Normal Saline 1 x Per Day/15 Days Discharge Instructions: Wash your hands with soap and water. Remove old dressing, discard into plastic bag and place into trash. Cleanse the wound with Normal Saline prior to applying a clean dressing using gauze sponges, not tissues or cotton balls. Do not scrub or use excessive force. Pat dry using gauze sponges, not tissue or cotton balls. Cleanser: Wound Cleanser 1 x Per Day/15 Days Discharge Instructions: Wash your hands with soap and water. Remove old dressing, discard into plastic bag and place into trash. Cleanse the wound with Wound Cleanser prior to applying a clean dressing using gauze sponges, not tissues or cotton balls. Do not scrub or  use excessive force. Pat dry using gauze sponges, not tissue or cotton balls. Primary Dressing: Gauze 1 x Per Day/15 Days Discharge  Instructions: Lightly moisten gauze with DAKINS-As directed: dry, moistened with saline or moistened with Dakins Solution Secondary Dressing: ABD Pad 5x9 (in/in) 1 x Per Day/15 Days Discharge Instructions: Cover with ABD pad 1. Would recommend currently that we going continue with the wound care measures as before and the patient is in agreement with plan. This includes the use of the Dakin's moistened gauze packing which I think is still good to be the best way to go. 2. I am also can recommend covering with an ABD pad followed by securing with tape. 3. I do think he needs more aggressive offloading to keep pressure under control and to be honest I think this is good to be something that is [name], [name] ([id number]) going to be an ongoing issue to address. Right now we will work on getting a Roho cushion and air mattress but he still needs to spend some time not just sitting on his wound or else this is can have a very difficult time that was discussed in great detail during the first visit I saw him last time. We will see patient back for reevaluation in 1 week here in the clinic. If anything worsens or changes patient will contact our office for additional recommendations. Electronic Signature(s) Signed: 10/26/2021 10:25:23 AM By: Worthy Keeler PA-C Entered By: Worthy Keeler on 10/26/2021 10:25:22 [name] ([id number]) -------------------------------------------------------------------------------- SuperBill Details Patient Name: [name] Date of Service: 10/26/2021 Medical Record Number: [id number] Patient Account Number: [id number] Date of Birth/Sex: 1966/07/15 (55 y.o. M) Treating RN: Donnamarie Poag Primary Care Provider: Fulton Reek Other Clinician: Referring Provider: Fulton Reek Treating Provider/Extender: Skipper Cliche in Treatment: 1 Diagnosis Coding ICD-10 Codes Code Description L89.314 Pressure ulcer of right buttock, stage 4 Q76.0 Spina  bifida occulta Z93.3 Colostomy status I10 Essential (primary) hypertension I25.10 Atherosclerotic heart disease of native coronary artery without angina pectoris Facility Procedures CPT4 Code: [id number] Description: 99213 - WOUND CARE VISIT-LEV 3 EST PT Modifier: Quantity: 1 Physician Procedures CPT4 Code: [id number] Description: 99214 - WC PHYS LEVEL 4 - EST PT Modifier: Quantity: 1 CPT4 Code: Description: ICD-10 Diagnosis Description L89.314 Pressure ulcer of right buttock, stage 4 Q76.0 Spina bifida occulta Z93.3 Colostomy status I10 Essential (primary) hypertension Modifier: Quantity: Electronic Signature(s) Signed: 10/26/2021 10:25:43 AM By: Worthy Keeler PA-C Entered By: Worthy Keeler on 10/26/2021 10:25:43

## 2021-11-02 ENCOUNTER — Ambulatory Visit: Payer: Medicare HMO | Admitting: Physician Assistant

## 2021-11-09 ENCOUNTER — Ambulatory Visit: Payer: Medicare HMO | Admitting: Physician Assistant

## 2021-11-16 ENCOUNTER — Ambulatory Visit: Payer: Medicare HMO | Admitting: Physician Assistant

## 2021-11-16 DIAGNOSIS — Z8739 Personal history of other diseases of the musculoskeletal system and connective tissue: Secondary | ICD-10-CM | POA: Diagnosis not present

## 2021-11-16 DIAGNOSIS — L89314 Pressure ulcer of right buttock, stage 4: Secondary | ICD-10-CM | POA: Diagnosis not present

## 2021-11-19 ENCOUNTER — Ambulatory Visit: Payer: Medicare HMO | Admitting: Podiatry

## 2021-11-23 ENCOUNTER — Ambulatory Visit: Payer: Medicare HMO | Admitting: Physician Assistant

## 2021-11-25 ENCOUNTER — Telehealth: Payer: Self-pay

## 2021-11-25 NOTE — Telephone Encounter (Signed)
Marlowe Kays with [facility] [phone]) called regarding patient's referral to Dr. Delaine Lame. Margaret and myself have tried to reach the patient multiple time to get him scheduled.The number listed on patient's chart is his sister's number, which is going to voicemail. Per Marlowe Kays she will try to reach out to the patient and have him call [facility] to schedule. Marlowe Kays also advised patient can be seen at [facility] and possibly do a telehealth visit if needed.  Canyon Lohr T Brooks Sailors

## 2021-11-30 ENCOUNTER — Ambulatory Visit: Payer: Medicare HMO | Admitting: Physician Assistant

## 2021-12-02 ENCOUNTER — Ambulatory Visit: Payer: Medicare HMO | Admitting: Podiatry

## 2021-12-07 ENCOUNTER — Encounter: Payer: Self-pay | Admitting: Podiatry

## 2021-12-07 ENCOUNTER — Ambulatory Visit (INDEPENDENT_AMBULATORY_CARE_PROVIDER_SITE_OTHER): Payer: Medicare HMO | Admitting: Podiatry

## 2021-12-07 ENCOUNTER — Other Ambulatory Visit: Payer: Self-pay

## 2021-12-07 DIAGNOSIS — B353 Tinea pedis: Secondary | ICD-10-CM | POA: Diagnosis not present

## 2021-12-07 DIAGNOSIS — B351 Tinea unguium: Secondary | ICD-10-CM

## 2021-12-07 DIAGNOSIS — M79675 Pain in left toe(s): Secondary | ICD-10-CM

## 2021-12-07 DIAGNOSIS — M79674 Pain in right toe(s): Secondary | ICD-10-CM

## 2021-12-07 MED ORDER — CLOTRIMAZOLE-BETAMETHASONE 1-0.05 % EX CREA: 1.0000 "application " | TOPICAL_CREAM | Freq: Every day | CUTANEOUS | 0 refills | Status: DC

## 2021-12-07 NOTE — Progress Notes (Signed)
?  Subjective:  ?Patient ID: [name], male    DOB: Apr 07, 1966,  MRN: [id number] ? ?Chief Complaint  ?Patient presents with  ? Nail Problem  ?  Nail trim, RFC  ? ? ?56 y.o. male presents with the above complaint. History confirmed with patient.  He is here with his sister who confirms the history.  He has a history of spina bifida and is paralyzed below the waist with little to no feeling.  He had a history of a wound on the left heel that has completely healed.  Nails are thickened elongated and causing skin compromise and he has dry scaling skin ? ?Objective:  ?Physical Exam: ?warm, good capillary refill, no trophic changes or ulcerative lesions, normal DP and PT pulses, and tinea pedis. ?Left Foot: dystrophic yellowed discolored nail plates with subungual debris ?Right Foot: dystrophic yellowed discolored nail plates with subungual debris ? ?Assessment:  ? ?1. Pain due to onychomycosis of toenails of both feet   ?2. Tinea pedis of both feet   ? ? ? ?Plan:  ?Patient was evaluated and treated and all questions answered. ? ?Discussed the etiology and treatment options for the condition in detail with the patient. Educated patient on the topical and oral treatment options for mycotic nails. Recommended debridement of the nails today. Sharp and mechanical debridement performed of all painful and mycotic nails today. Nails debrided in length and thickness using a nail nipper to level of comfort. Discussed treatment options including appropriate shoe gear. Follow up as needed for painful nails. ? ?Discussed the etiology and treatment options for tinea pedis.  Discussed topical and oral treatment.  Recommended topical treatment with Lotrisone cream.  This was sent to the patient's pharmacy.  Also discussed appropriate foot hygiene, use of antifungal spray such as Tinactin in shoes, as well as cleaning foot surfaces such as showers and bathroom floors with bleach. ? ? ?Return in about 3 months (around 03/09/2022) for  RFC.  ? ?

## 2021-12-09 ENCOUNTER — Encounter: Payer: Self-pay | Admitting: Internal Medicine

## 2021-12-09 ENCOUNTER — Ambulatory Visit (INDEPENDENT_AMBULATORY_CARE_PROVIDER_SITE_OTHER): Payer: Medicare HMO | Admitting: Internal Medicine

## 2021-12-09 ENCOUNTER — Other Ambulatory Visit: Payer: Self-pay

## 2021-12-09 VITALS — BP 103/71 | HR 103 | Temp 98.7°F

## 2021-12-09 DIAGNOSIS — L89319 Pressure ulcer of right buttock, unspecified stage: Secondary | ICD-10-CM

## 2021-12-09 DIAGNOSIS — M869 Osteomyelitis, unspecified: Secondary | ICD-10-CM

## 2021-12-09 MED ORDER — AMOXICILLIN-POT CLAVULANATE 875-125 MG PO TABS: 1.0000 | ORAL_TABLET | Freq: Two times a day (BID) | ORAL | 0 refills | Status: AC

## 2021-12-09 MED ORDER — DOXYCYCLINE HYCLATE 100 MG PO TABS: 100.0000 mg | ORAL_TABLET | Freq: Two times a day (BID) | ORAL | 0 refills | Status: AC

## 2021-12-09 NOTE — Progress Notes (Addendum)
?  ? ? ? ? ?[facility] ? ?CHIEF COMPLAINT:   ? ?Follow up for sacral OM ? ?SUBJECTIVE:   ? ?[name] is a 56 y.o. male with PMHx as below who presents to the clinic for sacral OM.  ? ?Patient has admitted at [facility] from 09/19/21-09/23/21 with worsening decubitus ulcer from right ischium in setting of chronic OM.  He was treated with IV antibiotics and underwent bedside debridement with general surgery 12/17.  No debridement cultures done.  Superficial wound culture then done on 12/20 with Proteus and diptheroids.  He was seen in consultation by Dr Delaine Lame who recommended IV antibiotics which patient declined.  Subsequently sent home on 2 weeks of doxy and augmentin however he never picked up these medications. He was seen for wound care on 10/26/21 and noted that wound appeared to be improving and there was no signs of worsening infection at that time.  Wound care felt that he needed more aggressive off loading.  Patient has not followed up with them since because they felt that they aren't doing anything there other than looking at his wound.  He and sister report the wound has gotten bigger and deeper in this interval with increased blood drainage.  No fevers or chills.  ? ?Please see A&P for the details of today's visit and status of the patient's medical problems.  ? ?Patient's Medications  ?New Prescriptions  ? No medications on file  ?Previous Medications  ? CLOTRIMAZOLE-BETAMETHASONE (LOTRISONE) CREAM    Apply 1 application. topically daily.  ? COLLAGENASE (SANTYL) OINTMENT    Apply topically daily.  ? CRANBERRY-VITAMIN C-PROBIOTIC (AZO CRANBERRY PO)    Take 1 tablet by mouth daily.  ? DICYCLOMINE (BENTYL) 20 MG TABLET    Take 20 mg by mouth 2 (two) times daily.  ? HYDROCHLOROTHIAZIDE (HYDRODIURIL) 25 MG TABLET    Take 25 mg by mouth daily.  ? MULTIPLE VITAMINS-MINERALS (CENTRUM MEN PO)    Take 1 tablet by mouth daily.   ? PANTOPRAZOLE (PROTONIX) 40 MG TABLET    Take 1  tablet (40 mg total) by mouth daily.  ? POTASSIUM CHLORIDE SA (KLOR-CON) 20 MEQ TABLET    Take 20 mEq by mouth daily.  ? ROSUVASTATIN (CRESTOR) 10 MG TABLET    Take 10 mg by mouth at bedtime.  ? SUCRALFATE (CARAFATE) 1 G TABLET    Take 1 g by mouth 3 (three) times daily.  ? TRAZODONE (DESYREL) 50 MG TABLET    Take 50 mg by mouth at bedtime.  ?Modified Medications  ? No medications on file  ?Discontinued Medications  ? No medications on file  ?   ? ?Past Medical History:  ?Diagnosis Date  ? Abdominal distention 10/11/2016  ? Acute lower UTI 12/22/2019  ? Acute sepsis ([place]) 04/21/2016  ? AKI (acute kidney injury) ([place]) 06/27/2020  ? Decubitus ulcer   ? Distal intestinal obstruction syndrome ([place]) 10/11/2016  ? Fecal impaction in rectum ([place]) 10/10/2016  ? Gastric outlet obstruction 06/03/2016  ? Gastritis   ? GERD (gastroesophageal reflux disease) 12/22/2019  ? HTN (hypertension) 06/27/2020  ? Hydronephrosis   ? Hyperlipidemia   ? Hypokalemia 10/11/2016  ? Impaction of the bowels ([place])   ? Kidney stone   ? Left ureteral stone 06/27/2020  ? Leukocytosis 10/11/2016  ? Loose stools 10/11/2016  ? Nausea & vomiting 06/03/2016  ? Renal disorder   ? SBO (small bowel obstruction) ([place]) 11/02/2017  ? Sepsis due to gram-negative UTI ([place])  03/08/2021  ? Septic shock ([place]) 03/08/2021  ? SIRS (systemic inflammatory response syndrome) ([place]) 06/03/2016  ? Spina bifida ([place])   ? Ulcer of esophagus with bleeding   ? ? ?Social History  ? ?Tobacco Use  ? Smoking status: Never  ? Smokeless tobacco: Never  ?Substance Use Topics  ? Alcohol use: No  ? Drug use: No  ? ? ?Family History  ?Problem Relation Age of Onset  ? Diabetes Mellitus II Father   ? Bladder Cancer Neg Hx   ? Kidney cancer Neg Hx   ? Prostate cancer Neg Hx   ? ? ?Allergies  ?Allergen Reactions  ? Morphine And Related Nausea And Vomiting  ? Sulfa Antibiotics Other (See Comments)  ?  Reaction: unknown  ? Ivp Dye [Iodinated Contrast Media] Hives  ? Latex Rash  ? ? ?Review of Systems  ?Constitutional:   Negative for chills and fever.  ?Skin:   ?     + sacral wound  ? ? ?OBJECTIVE:   ? ?Vitals:  ? 12/09/21 1014  ?BP: 103/71  ?Pulse: (!) 103  ?Temp: 98.7 ?F (37.1 ?C)  ?TempSrc: Temporal  ? ?There is no height or weight on file to calculate BMI. ? ?Physical Exam ?Constitutional:   ?   General: He is not in acute distress. ?HENT:  ?   Head: Normocephalic and atraumatic.  ?Eyes:  ?   Extraocular Movements: Extraocular movements intact.  ?   Conjunctiva/sclera: Conjunctivae normal.  ?Pulmonary:  ?   Effort: Pulmonary effort is normal. No respiratory distress.  ?Abdominal:  ?   Comments: + Colostomy.  ?Skin: ?   General: Skin is warm and dry.  ?   Findings: Lesion present.  ?   Comments: Large right ischial decubitus ulcer, deep.  Bone not seen on limited inspection.  Dressings and bandages are not adequate for the size of wound.  No significant pus draining but there is a lot of serous fluid draining and some malodor. ?Also has deep ulcer on left ischium that is smaller.  ?Neurological:  ?   General: No focal deficit present.  ?   Mental Status: He is alert and oriented to person, place, and time.  ?Psychiatric:     ?   Mood and Affect: Mood normal.     ?   Behavior: Behavior normal.  ? ? ? ?Labs and Microbiology: ?CBC Latest Ref Rng & Units 09/23/2021 09/22/2021 09/21/2021  ?WBC 4.0 - 10.5 K/uL 7.9 7.5 7.2  ?Hemoglobin 13.0 - 17.0 g/dL 10.6(L) 11.0(L) 11.3(L)  ?Hematocrit 39.0 - 52.0 % 35.1(L) 35.6(L) 35.9(L)  ?Platelets 150 - 400 K/uL 348 423(H) 405(H)  ? ?CMP Latest Ref Rng & Units 09/23/2021 09/22/2021 09/21/2021  ?Glucose 70 - 99 mg/dL 85 106(H) 81  ?BUN 6 - 20 mg/dL 17 26(H) 34(H)  ?Creatinine 0.61 - 1.24 mg/dL 1.21 1.20 1.38(H)  ?Sodium 135 - 145 mmol/L 137 140 135  ?Potassium 3.5 - 5.1 mmol/L 3.7 5.1 2.7(LL)  ?Chloride 98 - 111 mmol/L 112(H) 113(H) 100  ?CO2 22 - 32 mmol/L 20(L) 22 24  ?Calcium 8.9 - 10.3 mg/dL 8.5(L) 8.8(L) 8.6(L)  ?Total Protein 6.5 - 8.1 g/dL - - -  ?Total Bilirubin 0.3 - 1.2 mg/dL - - -   ?Alkaline Phos 38 - 126 U/L - - -  ?AST 15 - 41 U/L - - -  ?ALT 0 - 44 U/L - - -  ?  ? ? ? ?ASSESSMENT & PLAN:   ? ?1. Osteomyelitis of  right hip ([place]) ? ?2. Pressure injury of skin of right buttock,  ? ? ?Patient with admission 3 months ago for worsening right ischial pressure ulcer with chronic OM.  Status post debridement at the bedside and discharged home in December with empiric course of Doxy/Augmentin that was never taken.  Now with worsening wound size and depth per patient report.  Superficial wound cultures from 12/20 of unclear significance so will plan for empiric Doxy/Augmentin as previously intended and prescription sent today for 4 weeks.  Advised patient to follow up with wound care (perhaps can see a different provider) and maintain offloading (he reports he does not like to turn so this continues to be an issue).  He prefers to follow up in [place] so will have him follow up with Dr Delaine Lame in 4 weeks at [facility].  ? ? ?Mignon Pine ?[facility] ?[facility] ?12/09/2021, 10:37 AM ? ?I spent 40 minutes dedicated to the care of this patient on the date of this encounter to include pre-visit review of records, face-to-face time with the patient discussing OM, decubitus ulcer, and post-visit ordering of testing.  ? ? ?

## 2021-12-09 NOTE — Patient Instructions (Signed)
Antibiotics (Doxycycline and Augmentin) sent to pharmacy for 4 week course ? ?Follow up with wound care and try to off load pressure from your wound ? ?Follow up with Dr Delaine Lame in 4 weeks ?

## 2021-12-30 DIAGNOSIS — D649 Anemia, unspecified: Secondary | ICD-10-CM | POA: Diagnosis not present

## 2021-12-30 DIAGNOSIS — Z79899 Other long term (current) drug therapy: Secondary | ICD-10-CM | POA: Diagnosis not present

## 2021-12-30 DIAGNOSIS — S31000D Unspecified open wound of lower back and pelvis without penetration into retroperitoneum, subsequent encounter: Secondary | ICD-10-CM | POA: Diagnosis not present

## 2021-12-30 DIAGNOSIS — E559 Vitamin D deficiency, unspecified: Secondary | ICD-10-CM | POA: Diagnosis not present

## 2021-12-30 DIAGNOSIS — Z0000 Encounter for general adult medical examination without abnormal findings: Secondary | ICD-10-CM | POA: Diagnosis not present

## 2021-12-30 DIAGNOSIS — Q059 Spina bifida, unspecified: Secondary | ICD-10-CM | POA: Diagnosis not present

## 2021-12-30 DIAGNOSIS — E785 Hyperlipidemia, unspecified: Secondary | ICD-10-CM | POA: Diagnosis not present

## 2021-12-31 DIAGNOSIS — R8290 Unspecified abnormal findings in urine: Secondary | ICD-10-CM | POA: Diagnosis not present

## 2022-01-07 ENCOUNTER — Ambulatory Visit: Payer: Medicare HMO | Admitting: Infectious Diseases

## 2022-01-11 ENCOUNTER — Ambulatory Visit: Payer: Medicare HMO | Admitting: Physician Assistant

## 2022-01-15 ENCOUNTER — Encounter: Payer: Medicare Other | Attending: Physician Assistant | Admitting: Physician Assistant

## 2022-01-15 DIAGNOSIS — N183 Chronic kidney disease, stage 3 unspecified: Secondary | ICD-10-CM | POA: Diagnosis not present

## 2022-01-15 DIAGNOSIS — Z933 Colostomy status: Secondary | ICD-10-CM | POA: Diagnosis not present

## 2022-01-15 DIAGNOSIS — Q760 Spina bifida occulta: Secondary | ICD-10-CM | POA: Diagnosis not present

## 2022-01-15 DIAGNOSIS — I2510 Atherosclerotic heart disease of native coronary artery without angina pectoris: Secondary | ICD-10-CM | POA: Diagnosis not present

## 2022-01-15 DIAGNOSIS — I129 Hypertensive chronic kidney disease with stage 1 through stage 4 chronic kidney disease, or unspecified chronic kidney disease: Secondary | ICD-10-CM | POA: Insufficient documentation

## 2022-01-15 DIAGNOSIS — L89314 Pressure ulcer of right buttock, stage 4: Secondary | ICD-10-CM | POA: Insufficient documentation

## 2022-01-15 NOTE — Progress Notes (Signed)
[place], [name] ([id number]) ?Visit Report for 01/15/2022 ?Abuse Risk Screen Details ?Patient Name: [name], JILES ?Date of Service: 01/15/2022 2:30 PM ?Medical Record Number: [id number] ?Patient Account Number: [id number] ?Date of Birth/Sex: Dec 21, 1965 (56 y.o. M) ?Treating RN: Donnamarie Poag ?Primary Care Sierra Bissonette: Fulton Reek Other Clinician: ?Referring Adja Ruff: Referral, Self ?Treating Ariahna Smiddy/Extender: Jeri Cos ?Weeks in Treatment: 0 ?Abuse Risk Screen Items ?Answer ?ABUSE RISK SCREEN: ?Has anyone close to you tried to hurt or harm you recentlyo No ?Do you feel uncomfortable with anyone in your familyo No ?Has anyone forced you do things that you didnot want to doo No ?Electronic Signature(s) ?Signed: 01/15/2022 4:44:00 PM By: Donnamarie Poag ?Entered ByDonnamarie Poag on 01/15/2022 14:45:00 ?[place], [name] ([id number]) ?-------------------------------------------------------------------------------- ?Activities of Daily Living Details ?Patient Name: [name], [name] ?Date of Service: 01/15/2022 2:30 PM ?Medical Record Number: [id number] ?Patient Account Number: [id number] ?Date of Birth/Sex: [birth year] (56 y.o. M) ?Treating RN: Donnamarie Poag ?Primary Care Tiney Zipper: Fulton Reek Other Clinician: ?Referring Laresa Oshiro: Referral, Self ?Treating Alessandro Griep/Extender: Jeri Cos ?Weeks in Treatment: 0 ?Activities of Daily Living Items ?Answer ?Activities of Daily Living (Please select one for each item) ?Drive Automobile Not Able ?Take Medications Need Assistance ?Use Telephone Completely Able ?Care for Appearance Need Assistance ?Use Toilet Need Assistance ?Bath / Shower Need Assistance ?Dress Self Need Assistance ?Feed Self Completely Able ?Walk Not Able ?Get In / Out Bed Need Assistance ?Housework Not Able ?Prepare Meals Need Assistance ?Handle Money Need Assistance ?Shop for Self Not Able ?Electronic Signature(s) ?Signed: 01/15/2022 4:44:00 PM By: Donnamarie Poag ?Entered ByDonnamarie Poag on 01/15/2022  14:45:23 ?[place], [name] ([id number]) ?-------------------------------------------------------------------------------- ?Education Screening Details ?Patient Name: [name] ?Date of Service: 01/15/2022 2:30 PM ?Medical Record Number: [id number] ?Patient Account Number: [id number] ?Date of Birth/Sex: [birth year] (56 y.o. M) ?Treating RN: Donnamarie Poag ?Primary Care Martha Ellerby: Fulton Reek Other Clinician: ?Referring Leonna Schlee: Referral, Self ?Treating Gomer France/Extender: Jeri Cos ?Weeks in Treatment: 0 ?Learning Preferences/Education Level/Primary Language ?Learning Preference: Explanation ?Highest Education Level: High School ?Preferred Language: English ?Cognitive Barrier ?Language Barrier: No ?Translator Needed: No ?Memory Deficit: No ?Emotional Barrier: No ?Cultural/Religious Beliefs Affecting Medical Care: No ?Physical Barrier ?Impaired Vision: No ?Impaired Hearing: No ?Decreased Hand dexterity: No ?Knowledge/Comprehension ?Knowledge Level: Medium ?Comprehension Level: High ?Ability to understand written instructions: High ?Ability to understand verbal instructions: High ?Motivation ?Anxiety Level: Calm ?Cooperation: Cooperative ?Education Importance: Acknowledges Need ?Interest in Health Problems: Asks Questions ?Perception: Coherent ?Willingness to Engage in Self-Management ?High ?Activities: ?Readiness to Engage in Self-Management ?High ?Activities: ?Electronic Signature(s) ?Signed: 01/15/2022 4:44:00 PM By: Donnamarie Poag ?Entered ByDonnamarie Poag on 01/15/2022 14:45:50 ?[place], [name] ([id number]) ?-------------------------------------------------------------------------------- ?Fall Risk Assessment Details ?Patient Name: [name] ?Date of Service: 01/15/2022 2:30 PM ?Medical Record Number: [id number] ?Patient Account Number: [id number] ?Date of Birth/Sex: 01-27-1966 (56 y.o. M) ?Treating RN: Donnamarie Poag ?Primary Care Lovenia Debruler: Fulton Reek Other Clinician: ?Referring Charlton Boule: Referral,  Self ?Treating Jelicia Nantz/Extender: Jeri Cos ?Weeks in Treatment: 0 ?Fall Risk Assessment Items ?Have you had 2 or more falls in the last 12 monthso 0 No ?Have you had any fall that resulted in injury in the last 12 monthso 0 No ?FALLS RISK SCREEN ?History of falling - immediate or within 3 months 0 No ?Secondary diagnosis (Do you have 2 or more medical diagnoseso) 15 Yes ?Ambulatory aid ?None/bed rest/wheelchair/nurse 0 Yes ?Crutches/cane/walker 0 No ?Furniture 0 No ?Intravenous therapy Access/Saline/Heparin Lock 0 No ?Gait/Transferring ?Normal/ bed rest/ wheelchair 0 Yes ?Weak (short steps with or without shuffle, stooped but able to lift head while walking, may ?0 No ?seek support  from furniture) ?Impaired (short steps with shuffle, may have difficulty arising from chair, head down, impaired ?0 No ?balance) ?Mental Status ?Oriented to own ability 0 Yes ?Electronic Signature(s) ?Signed: 01/15/2022 4:44:00 PM By: Donnamarie Poag ?Entered ByDonnamarie Poag on 01/15/2022 14:46:04 ?[place], [name] ([id number]) ?-------------------------------------------------------------------------------- ?Foot Assessment Details ?Patient Name: [name], [name] ?Date of Service: 01/15/2022 2:30 PM ?Medical Record Number: [id number] ?Patient Account Number: [id number] ?Date of Birth/Sex: 19-Mar-1966 (56 y.o. M) ?Treating RN: Donnamarie Poag ?Primary Care Freddie Dymek: Fulton Reek Other Clinician: ?Referring Ramsey Guadamuz: Referral, Self ?Treating Aranza Geddes/Extender: Jeri Cos ?Weeks in Treatment: 0 ?Foot Assessment Items ?Site Locations ?+ = Sensation present, - = Sensation absent, C = Callus, U = Ulcer ?R = Redness, W = Warmth, M = Maceration, PU = Pre-ulcerative lesion ?F = Fissure, S = Swelling, D = Dryness ?Assessment ?Right: Left: ?Other Deformity: No No ?Prior Foot Ulcer: No No ?Prior Amputation: No No ?Charcot Joint: No No ?Ambulatory Status: Non-ambulatory ?Assistance Device: Wheelchair ?Gait: ?Notes ?spina bifida; paraplegia  complete ?Electronic Signature(s) ?Signed: 01/15/2022 4:44:00 PM By: Donnamarie Poag ?Entered ByDonnamarie Poag on 01/15/2022 14:46:55 ?[place], [name] ([id number]) ?-------------------------------------------------------------------------------- ?Nutrition Risk Screening Details ?Patient Name: GEROD, [name] ?Date of Service: 01/15/2022 2:30 PM ?Medical Record Number: [id number] ?Patient Account Number: [id number] ?Date of Birth/Sex: [birth year] (56 y.o. M) ?Treating RN: Donnamarie Poag ?Primary Care Minas Bonser: Fulton Reek Other Clinician: ?Referring Hiroyuki Ozanich: Referral, Self ?Treating Pleasant Britz/Extender: Jeri Cos ?Weeks in Treatment: 0 ?Height (in): 60 ?Weight (lbs): ?Body Mass Index (BMI): ?Nutrition Risk Screening Items ?Score Screening ?NUTRITION RISK SCREEN: ?I have an illness or condition that made me change the kind and/or amount of food I eat 0 No ?I eat fewer than two meals per day 0 No ?I eat few fruits and vegetables, or milk products 0 No ?I have three or more drinks of beer, liquor or wine almost every day 0 No ?I have tooth or mouth problems that make it hard for me to eat 0 No ?I don't always have enough money to buy the food I need 0 No ?I eat alone most of the time 0 No ?I take three or more different prescribed or over-the-counter drugs a day 0 No ?Without wanting to, I have lost or gained 10 pounds in the last six months 0 No ?I am not always physically able to shop, cook and/or feed myself 0 No ?Nutrition Protocols ?Good Risk Protocol ?Moderate Risk Protocol 0 Provide education on nutrition ?High Risk Proctocol ?Risk Level: Good Risk ?Score: 0 ?Electronic Signature(s) ?Signed: 01/15/2022 4:44:00 PM By: Donnamarie Poag ?Entered ByDonnamarie Poag on 01/15/2022 14:46:18 ?

## 2022-01-15 NOTE — Progress Notes (Addendum)
[name], [name] ([id number]) ?Visit Report for 01/15/2022 ?Chief Complaint Document Details ?Patient Name: [name], [name] ?Date of Service: 01/15/2022 2:30 PM ?Medical Record Number: [id number] ?Patient Account Number: [id number] ?Date of Birth/Sex: 11/21/1965 (56 y.o. M) ?Treating RN: Donnamarie Poag ?Primary Care Provider: Fulton Reek Other Clinician: ?Referring Provider: Referral, Self ?Treating Provider/Extender: Jeri Cos ?Weeks in Treatment: 0 ?Information Obtained from: Patient ?Chief Complaint ?Right ischial tuberosity pressure ulcer ?Electronic Signature(s) ?Signed: 01/15/2022 2:45:08 PM By: Worthy Keeler PA-C ?Entered By: Worthy Keeler on 01/15/2022 14:45:08 ?[place], [name] ([id number]) ?-------------------------------------------------------------------------------- ?Debridement Details ?Patient Name: [name], [name] ?Date of Service: 01/15/2022 2:30 PM ?Medical Record Number: [id number] ?Patient Account Number: [id number] ?Date of Birth/Sex: [birth year] (56 y.o. M) ?Treating RN: Donnamarie Poag ?Primary Care Provider: Fulton Reek Other Clinician: ?Referring Provider: Referral, Self ?Treating Provider/Extender: Jeri Cos ?Weeks in Treatment: 0 ?Debridement Performed for ?Wound #3 Right Ischial Tuberosity ?Assessment: ?Performed By: Physician Tommie Sams., PA-C ?Debridement Type: Chemical/Enzymatic/Mechanical ?Agent Used: saline/gauze ?Level of Consciousness (Pre- ?Awake and Alert ?procedure): ?Pre-procedure Verification/Time Out ?Yes - 13:30 ?Taken: ?Start Time: 13:30 ?Instrument: ?Other : saline/gauze ?Bleeding: None ?Response to Treatment: Procedure was tolerated well ?Level of Consciousness (Post- ?Awake and Alert ?procedure): ?Post Debridement Measurements of Total Wound ?Length: (cm) 3.5 ?Stage: Category/Stage IV ?Width: (cm) 6.4 ?Depth: (cm) 2.5 ?Volume: (cm?) 43.982 ?Character of Wound/Ulcer Post Debridement: Improved ?Post Procedure Diagnosis ?Same as Pre-procedure ?Electronic  Signature(s) ?Signed: 01/15/2022 4:10:12 PM By: Donnamarie Poag ?Signed: 01/15/2022 4:44:12 PM By: Worthy Keeler PA-C ?Entered ByDonnamarie Poag on 01/15/2022 16:10:12 ?[place], [name] ([id number]) ?-------------------------------------------------------------------------------- ?HPI Details ?Patient Name: [name], [name] ?Date of Service: 01/15/2022 2:30 PM ?Medical Record Number: [id number] ?Patient Account Number: [id number] ?Date of Birth/Sex: 07/07/1966 (56 y.o. M) ?Treating RN: Donnamarie Poag ?Primary Care Provider: Fulton Reek Other Clinician: ?Referring Provider: Referral, Self ?Treating Provider/Extender: Jeri Cos ?Weeks in Treatment: 0 ?History of Present Illness ?HPI Description: 56 year old male with a history of spina bifida presenting to [place] with a history of a open wound on the left gluteal region near ?his upper thigh which she's had for several weeks. He was seen in the ED recently at [facility] and was put on doxycycline and was ?advised some local care. his past medical history is significant for spinal bifid, neurogenic bladder, kidney stones, sacral pressure sores, ?constipation. Past surgical history significant for partial cystectomy, ileal conduit, VP shunt removal, percutaneous nephro lithotripsy. ?In the remote past the patient says he's had some treatment for a ulcer on this area and was treated with a skin graft. ?Readmission: ?10/16/2021 upon evaluation today patient appears to be doing somewhat poorly in regard to a fairly large wound noted over the right ischial ?tuberosity location. This is a stage IV pressure ulcer. Of note this is also positive for osteomyelitis upon a CT scan which was performed during the ?time that he was admitted in the hospital on 09/19/2021. With that being said it is noted in the right inferior buttock that he has a decubitus ulcer ?which extends to the posterior toe inferior right ischium where there is evidence of osteomyelitis. When he was here in  the office actually missed ?that this was positive I missed read it and thought it said that there was no osteomyelitis. That is not the case there is in fact osteomyelitis noted ?here. ?I actually did review his notes as well in epic. Looking to the discharge summary it appears that the patient was admitted from 09/19/2021 ?through 09/23/2021. He was discharged home with home health care  according to the note. With that being said from what I understand his ?sister actually takes care of him at this point. He was also to follow-up with a primary care provider and here at the wound care center within a ?week. I am just now seeing him on the 13th almost a month after discharge. He does have a history of again osteomyelitis of this right hip/ischial ?tuberosity location. 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 The reason for his admission was actually worsening of the decubitus ulcer upon ?admission. He does have chronic osteomyelitis of this area. He was treated with empiric antibiotics he had acute kidney injury which improved ?with IV fluids he also had hypokalemia. Surgical debridement was performed by general surgery at that time and ID was consulted to assist with ?management according to the notes. IV antibiotics were recommended but patient declined and wanted oral antibiotics at discharge. Therefore no ?IV antibiotics were planned for discharge time. This case was under the care of Dr. Steva Ready while he was in the hospital when she did discharge ?him with a 2-week course of doxycycline and Augmentin according to the notes. It looks like Dr. Doy Hutching office did call and leave a message for the ?patient to get scheduled for a follow-up visit after hospital discharge I do not see where he showed up in fact it appears that the patient has a no- ?show letter from  Dr. Doy Hutching office noted in epic due to a appointment that was missed on 10/06/2021. Looking at the pictures from Dr. Raelene Bott ?notes on 09/23/2021 it does appear that the wound is a little bit better as far as the cleanliness of the surface of the wound today but nonetheless ?still is a very significant wound. ?10/26/2021 upon evaluation today patient appears to be doing okay in regard to his wound. Again this is a wound that he did indeed have ?osteomyelitis and previous. With that being said I do not see any signs of a worsening infection at this time which is good news. Overall I think ?that we are headed in the right direction although still I think he needs more aggressive offloading. Where in the works of getting him a Roho ?cushion and air mattress. I do believe this would be ideal for him to be honest. ?Readmission: ?01-15-2022 upon evaluation today patient presents for reevaluation he was last seen on October 26, 2021. At that time unfortunately he was ?having issues with a significant ischial pressure ulcer on the right ischial tuberosity. Subsequently he ended up having decent response to the ?Dakin's moistened gauze dressing that was previously being utilized and subsequently the patient's sister who is his primary caregiver as well ?states that she decided just to try to manage this at home. Nonetheless unfortunately this is getting a little larger not better. He does spend a lot ?of the day in his chair. He does have an air mattress according what they tell me today. That was previously ordered. With that being said I do ?believe that at this time things do appear to be doing much better from the standpoint of infection I do not see any signs of overt infection. There ?is also no evidence of systemic infection. No fevers, chills, nausea, vomiting, or diarrhea. With that being said I do believe based on what I am ?seeing the wound is  a bit cleaner and he possibly could be an excellent candidate for a  wound VAC. ?Electronic Signature(s) ?Signed: 01/15/2022 4:29:15 PM By: Worthy Keeler PA-C ?Entered By: Worthy Keeler on 01/15/2022 16:29:15 ?[place], [name] ([id number]) ?--------------------------------------

## 2022-01-15 NOTE — Progress Notes (Signed)
[place], [name] ([id number]) ?Visit Report for 01/15/2022 ?Allergy List Details ?Patient Name: [name], [name] ?Date of Service: 01/15/2022 2:30 PM ?Medical Record Number: [id number] ?Patient Account Number: [id number] ?Date of Birth/Sex: 12-16-1965 (56 y.o. M) ?Treating RN: Donnamarie Poag ?Primary Care Darl Kuss: Fulton Reek Other Clinician: ?Referring Ermagene Saidi: Referral, Self ?Treating Imanie Darrow/Extender: Jeri Cos ?Weeks in Treatment: 0 ?Allergies ?Active Allergies ?Sulfa (Sulfonamide Antibiotics) ?latex ?morphine ?Iodinated Contrast Media ?Severity: Severe ?Allergy Notes ?Electronic Signature(s) ?Signed: 01/15/2022 4:44:00 PM By: Donnamarie Poag ?Entered ByDonnamarie Poag on 01/15/2022 14:43:18 ?[place], [name] ([id number]) ?-------------------------------------------------------------------------------- ?Arrival Information Details ?Patient Name: [name] ?Date of Service: 01/15/2022 2:30 PM ?Medical Record Number: [id number] ?Patient Account Number: [id number] ?Date of Birth/Sex: 1966-04-14 (56 y.o. M) ?Treating RN: Donnamarie Poag ?Primary Care Nidya Bouyer: Fulton Reek Other Clinician: ?Referring Cynthya Yam: Referral, Self ?Treating Kamile Fassler/Extender: Jeri Cos ?Weeks in Treatment: 0 ?Visit Information ?Patient Arrived: Wheel Chair ?Arrival Time: 14:35 ?Accompanied By: caregiver ?Transfer Assistance: Transfer Board ?Patient Identification Verified: Yes ?Secondary Verification Process Completed: Yes ?Patient Requires Transmission-Based Precautions: No ?Patient Has Alerts: Yes ?Patient Alerts: NOT diabetic ?History Since Last Visit ?Electronic Signature(s) ?Signed: 01/15/2022 4:44:00 PM By: Donnamarie Poag ?Entered ByDonnamarie Poag on 01/15/2022 14:58:00 ?[place], [name] ([id number]) ?-------------------------------------------------------------------------------- ?Clinic Level of Care Assessment Details ?Patient Name: [name] ?Date of Service: 01/15/2022 2:30 PM ?Medical Record Number: [id number] ?Patient  Account Number: [id number] ?Date of Birth/Sex: [birth year] (56 y.o. M) ?Treating RN: Donnamarie Poag ?Primary Care Amadu Schlageter: Fulton Reek Other Clinician: ?Referring Roshni Burbano: Referral, Self ?Treating Reianna Batdorf/Extender: Jeri Cos ?Weeks in Treatment: 0 ?Clinic Level of Care Assessment Items ?TOOL 2 Quantity Score ?'[]'$  - Use when only an EandM is performed on the INITIAL visit 0 ?ASSESSMENTS - Nursing Assessment / Reassessment ?X - General Physical Exam (combine w/ comprehensive assessment (listed just below) when performed on new ?1 20 ?pt. evals) ?X- 1 25 ?Comprehensive Assessment (HX, ROS, Risk Assessments, Wounds Hx, etc.) ?ASSESSMENTS - Wound and Skin Assessment / Reassessment ?X - Simple Wound Assessment / Reassessment - one wound 1 5 ?'[]'$  - 0 ?Complex Wound Assessment / Reassessment - multiple wounds ?'[]'$  - 0 ?Dermatologic / Skin Assessment (not related to wound area) ?ASSESSMENTS - Ostomy and/or Continence Assessment and Care ?'[]'$  - Incontinence Assessment and Management 0 ?'[]'$  - 0 ?Ostomy Care Assessment and Management (repouching, etc.) ?PROCESS - Coordination of Care ?X - Simple Patient / Family Education for ongoing care 1 15 ?'[]'$  - 0 ?Complex (extensive) Patient / Family Education for ongoing care ?'[]'$  - 0 ?Staff obtains Consents, Records, Test Results / Process Orders ?'[]'$  - 0 ?Staff telephones HHA, Nursing Homes / Clarify orders / etc ?'[]'$  - 0 ?Routine Transfer to another Facility (non-emergent condition) ?'[]'$  - 0 ?Routine Hospital Admission (non-emergent condition) ?X- 1 15 ?New Admissions / Biomedical engineer / Ordering NPWT, Apligraf, etc. ?'[]'$  - 0 ?Emergency Hospital Admission (emergent condition) ?X- 1 10 ?Simple Discharge Coordination ?'[]'$  - 0 ?Complex (extensive) Discharge Coordination ?PROCESS - Special Needs ?'[]'$  - Pediatric / Minor Patient Management 0 ?'[]'$  - 0 ?Isolation Patient Management ?'[]'$  - 0 ?Hearing / Language / Visual special needs ?'[]'$  - 0 ?Assessment of Community assistance (transportation,  D/C planning, etc.) ?'[]'$  - 0 ?Additional assistance / Altered mentation ?X- 1 15 ?Support Surface(s) Assessment (bed, cushion, seat, etc.) ?INTERVENTIONS - Wound Cleansing / Measurement ?X - Wound Imaging (photographs - any number of wounds) 1 5 ?'[]'$  - 0 ?Wound Tracing (instead of photographs) ?X- 1 5 ?Simple Wound Measurement - one wound ?'[]'$  - 0 ?Complex Wound Measurement - multiple wounds ?Jonny Ruiz,  Kiano ([id number]) ?X- 1 5 ?Simple Wound Cleansing - one wound ?'[]'$  - 0 ?Complex Wound Cleansing - multiple wounds ?INTERVENTIONS - Wound Dressings ?X - Small Wound Dressing one or multiple wounds 1 10 ?'[]'$  - 0 ?Medium Wound Dressing one or multiple wounds ?'[]'$  - 0 ?Large Wound Dressing one or multiple wounds ?'[]'$  - 0 ?Application of Medications - injection ?INTERVENTIONS - Miscellaneous ?'[]'$  - External ear exam 0 ?'[]'$  - 0 ?Specimen Collection (cultures, biopsies, blood, body fluids, etc.) ?'[]'$  - 0 ?Specimen(s) / Culture(s) sent or taken to Lab for analysis ?'[]'$  - 0 ?Patient Transfer (multiple staff / Civil Service fast streamer / Similar devices) ?'[]'$  - 0 ?Simple Staple / Suture removal (25 or less) ?'[]'$  - 0 ?Complex Staple / Suture removal (26 or more) ?'[]'$  - 0 ?Hypo / Hyperglycemic Management (close monitor of Blood Glucose) ?'[]'$  - 0 ?Ankle / Brachial Index (ABI) - do not check if billed separately ?Has the patient been seen at the hospital within the last three years: Yes ?Total Score: 130 ?Level Of Care: New/Established - Level ?4 ?Electronic Signature(s) ?Signed: 01/15/2022 4:44:00 PM By: Donnamarie Poag ?Entered ByDonnamarie Poag on 01/15/2022 15:31:07 ?[place], [name] ([id number]) ?-------------------------------------------------------------------------------- ?Encounter Discharge Information Details ?Patient Name: [name], Chase ?Date of Service: 01/15/2022 2:30 PM ?Medical Record Number: [id number] ?Patient Account Number: [id number] ?Date of Birth/Sex: 1966/03/08 (56 y.o. M) ?Treating RN: Donnamarie Poag ?Primary Care Izaah Westman: Fulton Reek  Other Clinician: ?Referring Toni Demo: Referral, Self ?Treating Gwenivere Hiraldo/Extender: Jeri Cos ?Weeks in Treatment: 0 ?Encounter Discharge Information Items ?Discharge Condition: Stable ?Ambulatory Status: Wheelchair ?Discharge Destination: Home ?Transportation: Private Auto ?Accompanied By: self ?Schedule Follow-up Appointment: Yes ?Clinical Summary of Care: ?Electronic Signature(s) ?Signed: 01/15/2022 4:44:00 PM By: Donnamarie Poag ?Entered ByDonnamarie Poag on 01/15/2022 15:43:03 ?[place], [name] ([id number]) ?-------------------------------------------------------------------------------- ?Lower Extremity Assessment Details ?Patient Name: AUNDRE, HIETALA ?Date of Service: 01/15/2022 2:30 PM ?Medical Record Number: [id number] ?Patient Account Number: [id number] ?Date of Birth/Sex: 10-23-1965 (55 y.o. M) ?Treating RN: Donnamarie Poag ?Primary Care Lliam Hoh: Fulton Reek Other Clinician: ?Referring Yazmina Pareja: Referral, Self ?Treating Aerionna Moravek/Extender: Jeri Cos ?Weeks in Treatment: 0 ?Electronic Signature(s) ?Signed: 01/15/2022 4:44:00 PM By: Donnamarie Poag ?Entered ByDonnamarie Poag on 01/15/2022 14:47:12 ?[place], [name] ([id number]) ?-------------------------------------------------------------------------------- ?Multi Wound Chart Details ?Patient Name: JANSEN, SCIUTO ?Date of Service: 01/15/2022 2:30 PM ?Medical Record Number: [id number] ?Patient Account Number: [id number] ?Date of Birth/Sex: 02/04/1966 (55 y.o. M) ?Treating RN: Donnamarie Poag ?Primary Care Tamicka Shimon: Fulton Reek Other Clinician: ?Referring Randy Castrejon: Referral, Self ?Treating Maisyn Nouri/Extender: Jeri Cos ?Weeks in Treatment: 0 ?Vital Signs ?Height(in): 60 ?Pulse(bpm): 106 ?Weight(lbs): ?Blood Pressure(mmHg): 128/76 ?Body Mass Index(BMI): ?Temperature(??F): 97.4 ?Respiratory Rate(breaths/min): 16 ?Photos: [N/A:N/A] ?Wound Location: Right Gluteus N/A N/A ?Wounding Event: Gradually Appeared N/A N/A ?Primary Etiology: Pressure Ulcer N/A N/A ?Comorbid  History: Cataracts, Glaucoma, Coronary N/A N/A ?Artery Disease, Hypertension, ?History of pressure wounds, ?Osteoarthritis, Paraplegia ?Date Acquired: 09/03/2021 N/A N/A ?Weeks of Treatment: 0 N/A N/A ?Wound Status

## 2022-01-25 ENCOUNTER — Ambulatory Visit: Payer: Medicare Other | Admitting: Physician Assistant

## 2022-01-26 ENCOUNTER — Encounter (HOSPITAL_COMMUNITY)
Admission: RE | Admit: 2022-01-26 | Discharge: 2022-01-26 | Disposition: A | Discharge status: Home / Self Care | Payer: Medicare Other | Source: Ambulatory Visit | Attending: Physician Assistant | Admitting: Physician Assistant

## 2022-01-26 DIAGNOSIS — Z933 Colostomy status: Secondary | ICD-10-CM | POA: Diagnosis present

## 2022-01-26 DIAGNOSIS — Q059 Spina bifida, unspecified: Secondary | ICD-10-CM | POA: Diagnosis not present

## 2022-01-26 DIAGNOSIS — Z433 Encounter for attention to colostomy: Secondary | ICD-10-CM

## 2022-01-26 DIAGNOSIS — N99528 Other complication of other external stoma of urinary tract: Secondary | ICD-10-CM | POA: Diagnosis not present

## 2022-01-26 NOTE — Progress Notes (Signed)
[facility]  ? ?Reason for visit:  ?LLQ colostomy ?RLQ ileal conduit  recently started leaking ?HPI:  ?Spina bifida ?ROS  ?Review of Systems  ?Gastrointestinal:   ?     LLQ colostomy ?RLQ ileal conduit  ?Vital signs:  ?BP 102/64 (BP Location: Right Arm)   Pulse 97   Temp 98 ?F (36.7 ?C) (Axillary)   Resp 20   SpO2 98%  ?Exam:  ?Physical Exam ?Abdominal:  ?   Comments: Rounded abdomen  ?Musculoskeletal:     ?   General: Deformity present.  ?   Comments: Spina bifida  ?Skin: ?   General: Skin is warm and dry.  ?   Comments: HAs stage 4 ischial wound, seen at wound care center.   ?Neurological:  ?   Mental Status: He is alert and oriented to person, place, and time.  ?Psychiatric:     ?   Behavior: Behavior normal.  ?  ?Stoma type/location:  LLQ colostomy ?RLQ ileal conduit ?Stomal assessment/size:  1 3/8" colostomy ?1" ileal conduit ?Peristomal assessment:  intact ?Treatment options for stomal/peristomal skin: ileal conduit  needs 1 piece convex pouch  ?LLQ colostomy  needs filtered pouch  filled with flatus.  ?Output: clear yellow urine ?Soft brown stool ?Ostomy pouching: 1pc.convex urostomy pouch ?2 piece colostomy pouch, needs filtered pouch ?Education provided:  sister with patient (POA)  Explain rationale for filtered pouch for colostomy  needs convex pouch for ileal conduit ? ?  ?Impression/dx  ?Leaking appliance ?Discussion  ?Sister and patient grateful for new pouching recommendations.  ?Plan  ?See back as needed.  Will set up with [facility] for new supplies.  ? ? ? ?Visit time: 55 minutes.  ? ?Domenic Moras FNP-BC ? ?  ?

## 2022-01-28 ENCOUNTER — Ambulatory Visit: Payer: Medicare Other

## 2022-01-28 NOTE — Discharge Instructions (Signed)
Changing supplies to:  ?Filtered 2 piece colostomy pouch ?1 piece convex urostomy pouch ?

## 2022-02-05 ENCOUNTER — Encounter: Payer: Medicare Other | Attending: Physician Assistant | Admitting: Physician Assistant

## 2022-02-05 DIAGNOSIS — Q760 Spina bifida occulta: Secondary | ICD-10-CM | POA: Diagnosis not present

## 2022-02-05 DIAGNOSIS — N319 Neuromuscular dysfunction of bladder, unspecified: Secondary | ICD-10-CM | POA: Diagnosis not present

## 2022-02-05 DIAGNOSIS — L89314 Pressure ulcer of right buttock, stage 4: Secondary | ICD-10-CM | POA: Diagnosis present

## 2022-02-05 DIAGNOSIS — Z933 Colostomy status: Secondary | ICD-10-CM | POA: Insufficient documentation

## 2022-02-05 DIAGNOSIS — L89323 Pressure ulcer of left buttock, stage 3: Secondary | ICD-10-CM | POA: Insufficient documentation

## 2022-02-05 DIAGNOSIS — I129 Hypertensive chronic kidney disease with stage 1 through stage 4 chronic kidney disease, or unspecified chronic kidney disease: Secondary | ICD-10-CM | POA: Insufficient documentation

## 2022-02-05 DIAGNOSIS — I2510 Atherosclerotic heart disease of native coronary artery without angina pectoris: Secondary | ICD-10-CM | POA: Insufficient documentation

## 2022-02-05 DIAGNOSIS — N183 Chronic kidney disease, stage 3 unspecified: Secondary | ICD-10-CM | POA: Insufficient documentation

## 2022-02-05 NOTE — Progress Notes (Addendum)
[place], [name] ([id number]) ?Visit Report for 02/05/2022 ?Chief Complaint Document Details ?Patient Name: [name], [name] ?Date of Service: 02/05/2022 9:45 AM ?Medical Record Number: [id number] ?Patient Account Number: [id number] ?Date of Birth/Sex: 1966-06-26 (56 y.o. M) ?Treating RN: Levora Dredge ?Primary Care Provider: Fulton Reek Other Clinician: ?Referring Provider: Fulton Reek ?Treating Provider/Extender: Jeri Cos ?Weeks in Treatment: 3 ?Information Obtained from: Patient ?Chief Complaint ?Right ischial tuberosity pressure ulcer ?Electronic Signature(s) ?Signed: 02/05/2022 9:48:18 AM By: Worthy Keeler PA-C ?Entered By: Worthy Keeler on 02/05/2022 09:48:18 ?[place], [name] ([id number]) ?-------------------------------------------------------------------------------- ?HPI Details ?Patient Name: [name], [name] ?Date of Service: 02/05/2022 9:45 AM ?Medical Record Number: [id number] ?Patient Account Number: [id number] ?Date of Birth/Sex: 1966-03-14 (56 y.o. M) ?Treating RN: Levora Dredge ?Primary Care Provider: Fulton Reek Other Clinician: ?Referring Provider: Fulton Reek ?Treating Provider/Extender: Jeri Cos ?Weeks in Treatment: 3 ?History of Present Illness ?HPI Description: 56 year old male with a history of spina bifida presenting to [place] with a history of a open wound on the left gluteal region near ?his upper thigh which she's had for several weeks. He was seen in the ED recently at [facility] and was put on doxycycline and was ?advised some local care. his past medical history is significant for spinal bifid, neurogenic bladder, kidney stones, sacral pressure sores, ?constipation. Past surgical history significant for partial cystectomy, ileal conduit, VP shunt removal, percutaneous nephro lithotripsy. ?In the remote past the patient says he's had some treatment for a ulcer on this area and was treated with a skin graft. ?Readmission: ?10/16/2021 upon evaluation today  patient appears to be doing somewhat poorly in regard to a fairly large wound noted over the right ischial ?tuberosity location. This is a stage IV pressure ulcer. Of note this is also positive for osteomyelitis upon a CT scan which was performed during the ?time that he was admitted in the hospital on 09/19/2021. With that being said it is noted in the right inferior buttock that he has a decubitus ulcer ?which extends to the posterior toe inferior right ischium where there is evidence of osteomyelitis. When he was here in the office actually missed ?that this was positive I missed read it and thought it said that there was no osteomyelitis. That is not the case there is in fact osteomyelitis noted ?here. ?I actually did review his notes as well in epic. Looking to the discharge summary it appears that the patient was admitted from 09/19/2021 ?through 09/23/2021. He was discharged home with home health care according to the note. With that being said from what I understand his ?sister actually takes care of him at this point. He was also to follow-up with a primary care provider and here at the wound care center within a ?week. I am just now seeing him on the 13th almost a month after discharge. He does have a history of again osteomyelitis of this right hip/ischial ?tuberosity location. 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 The reason for his admission was actually worsening of the decubitus ulcer upon ?admission. He does have chronic osteomyelitis of this area. He was treated with empiric antibiotics he had acute kidney injury which improved ?with IV fluids he also had hypokalemia. Surgical debridement was performed by general surgery at that time and ID was consulted to assist with ?management according to the notes. IV antibiotics were recommended  but  patient declined and wanted oral antibiotics at discharge. Therefore no ?IV antibiotics were planned for discharge time. This case was under the care of Dr. Steva Ready while he was in the hospital when she did discharge ?him with a 2-week course of doxycycline and Augmentin according to the notes. It looks like Dr. Doy Hutching office did call and leave a message for the ?patient to get scheduled for a follow-up visit after hospital discharge I do not see where he showed up in fact it appears that the patient has a no- ?show letter from Dr. Doy Hutching office noted in epic due to a appointment that was missed on 10/06/2021. Looking at the pictures from Dr. Raelene Bott ?notes on 09/23/2021 it does appear that the wound is a little bit better as far as the cleanliness of the surface of the wound today but nonetheless ?still is a very significant wound. ?10/26/2021 upon evaluation today patient appears to be doing okay in regard to his wound. Again this is a wound that he did indeed have ?osteomyelitis and previous. With that being said I do not see any signs of a worsening infection at this time which is good news. Overall I think ?that we are headed in the right direction although still I think he needs more aggressive offloading. Where in the works of getting him a Roho ?cushion and air mattress. I do believe this would be ideal for him to be honest. ?Readmission: ?01-15-2022 upon evaluation today patient presents for reevaluation he was last seen on October 26, 2021. At that time unfortunately he was ?having issues with a significant ischial pressure ulcer on the right ischial tuberosity. Subsequently he ended up having decent response to the ?Dakin's moistened gauze dressing that was previously being utilized and subsequently the patient's sister who is his primary caregiver as well ?states that she decided just to try to manage this at home. Nonetheless unfortunately this is getting a little larger not better. He does spend a  lot ?of the day in his chair. He does have an air mattress according what they tell me today. That was previously ordered. With that being said I do ?believe that at this time things do appear to be doing much better from the standpoint of infection I do not see any signs of overt infection. There ?is also no evidence of systemic infection. No fevers, chills, nausea, vomiting, or diarrhea. With that being said I do believe based on what I am ?seeing the wound is a bit cleaner and he possibly could be an excellent candidate for a wound VAC. ?02-05-2022 upon evaluation today patient appears to be doing about the same in regard to his wound. Unfortunately he is not any better but he ?also really has not had the wound VAC on sufficiently. There is been some confusion with the orders part of that I think is our follow-up with the ?way the order was written part of it may be with how the wound VAC is being placed either way we need to see what we can do to try to improve ?things in this regard. We will clarify the orders today going forward for home health. ?Unfortunately the patient does have a new wound today which is on the opposite side. This is not good 1 was bad enough have another area to ?open is definitely not a good thing. Its not as deep but nonetheless is also not very shallow either. We may potentially be able to get this to the ?point that  we could bridge the 2 together in order to wound VAC both but right now want a focus on to try to get the wound VAC going for the ?main wound initially we will likely use Hydrofera Blue on the new wound. ?Electronic Signature(s) ?Signed: 02/05/2022 4:36:06 PM By: Worthy Keeler PA-C ?Entered By: Worthy Keeler on 02/05/2022 16:36:06 ?[place], [name] ([id number]) ?-------------------------------------------------------------------------------- ?Physical Exam Details ?Patient Name: [name], [name] ?Date of Service: 02/05/2022 9:45 AM ?Medical Record Number: [id number] ?Patient  Account Number: [id number] ?Date of Birth/Sex: 1965/11/26 (56 y.o. M) ?Treating RN: Levora Dredge ?Primary Care Provider: Fulton Reek Other Clinician: ?Referring Provider: Fulton Reek ?Treating Provide

## 2022-02-05 NOTE — Progress Notes (Addendum)
[place], [name] ([id number]) ?Visit Report for 02/05/2022 ?Arrival Information Details ?Patient Name: [name], [name] ?Date of Service: 02/05/2022 9:45 AM ?Medical Record Number: [id number] ?Patient Account Number: [id number] ?Date of Birth/Sex: October 28, 1965 (56 y.o. M) ?Treating RN: Levora Dredge ?Primary Care Tailor Lucking: Fulton Reek Other Clinician: ?Referring Rheya Minogue: Fulton Reek ?Treating Lakendria Nicastro/Extender: Jeri Cos ?Weeks in Treatment: 3 ?Visit Information History Since Last Visit ?Added or deleted any medications: No ?Patient Arrived: Wheel Chair ?Any new allergies or adverse reactions: No ?Arrival Time: 09:55 ?Had a fall or experienced change in No ?Accompanied By: family ?activities of daily living that may affect ?Transfer Assistance: Transfer Board ?risk of falls: ?Patient Identification Verified: Yes ?Hospitalized since last visit: No ?Secondary Verification Process Completed: Yes ?Has Dressing in Place as Prescribed: Yes ?Patient Requires Transmission-Based Precautions: No ?Pain Present Now: No ?Patient Has Alerts: Yes ?Patient Alerts: NOT diabetic ?Electronic Signature(s) ?Signed: 02/05/2022 3:51:04 PM By: Levora Dredge ?Entered By: Levora Dredge on 02/05/2022 09:55:31 ?[place], [name] ([id number]) ?-------------------------------------------------------------------------------- ?Clinic Level of Care Assessment Details ?Patient Name: [name], [name] ?Date of Service: 02/05/2022 9:45 AM ?Medical Record Number: [id number] ?Patient Account Number: [id number] ?Date of Birth/Sex: 1966-02-09 (56 y.o. M) ?Treating RN: Levora Dredge ?Primary Care Aidynn Krenn: Fulton Reek Other Clinician: ?Referring Arrianna Catala: Fulton Reek ?Treating Ryan Palermo/Extender: Jeri Cos ?Weeks in Treatment: 3 ?Clinic Level of Care Assessment Items ?TOOL 4 Quantity Score ?'[]'$  - Use when only an EandM is performed on FOLLOW-UP visit 0 ?ASSESSMENTS - Nursing Assessment / Reassessment ?X - Reassessment of Co-morbidities  (includes updates in patient status) 1 10 ?X- 1 5 ?Reassessment of Adherence to Treatment Plan ?ASSESSMENTS - Wound and Skin Assessment / Reassessment ?'[]'$  - Simple Wound Assessment / Reassessment - one wound 0 ?X- 2 5 ?Complex Wound Assessment / Reassessment - multiple wounds ?'[]'$  - 0 ?Dermatologic / Skin Assessment (not related to wound area) ?ASSESSMENTS - Focused Assessment ?'[]'$  - Circumferential Edema Measurements - multi extremities 0 ?'[]'$  - 0 ?Nutritional Assessment / Counseling / Intervention ?'[]'$  - 0 ?Lower Extremity Assessment (monofilament, tuning fork, pulses) ?'[]'$  - 0 ?Peripheral Arterial Disease Assessment (using hand held doppler) ?ASSESSMENTS - Ostomy and/or Continence Assessment and Care ?'[]'$  - Incontinence Assessment and Management 0 ?'[]'$  - 0 ?Ostomy Care Assessment and Management (repouching, etc.) ?PROCESS - Coordination of Care ?X - Simple Patient / Family Education for ongoing care 1 15 ?'[]'$  - 0 ?Complex (extensive) Patient / Family Education for ongoing care ?'[]'$  - 0 ?Staff obtains Consents, Records, Test Results / Process Orders ?'[]'$  - 0 ?Staff telephones HHA, Nursing Homes / Clarify orders / etc ?'[]'$  - 0 ?Routine Transfer to another Facility (non-emergent condition) ?'[]'$  - 0 ?Routine Hospital Admission (non-emergent condition) ?'[]'$  - 0 ?New Admissions / Biomedical engineer / Ordering NPWT, Apligraf, etc. ?'[]'$  - 0 ?Emergency Hospital Admission (emergent condition) ?X- 1 10 ?Simple Discharge Coordination ?'[]'$  - 0 ?Complex (extensive) Discharge Coordination ?PROCESS - Special Needs ?'[]'$  - Pediatric / Minor Patient Management 0 ?'[]'$  - 0 ?Isolation Patient Management ?'[]'$  - 0 ?Hearing / Language / Visual special needs ?'[]'$  - 0 ?Assessment of Community assistance (transportation, D/C planning, etc.) ?'[]'$  - 0 ?Additional assistance / Altered mentation ?'[]'$  - 0 ?Support Surface(s) Assessment (bed, cushion, seat, etc.) ?INTERVENTIONS - Wound Cleansing / Measurement ?[place], [name] ([id number]) ?'[]'$  - 0 ?Simple Wound  Cleansing - one wound ?X- 2 5 ?Complex Wound Cleansing - multiple wounds ?X- 1 5 ?Wound Imaging (photographs - any number of wounds) ?'[]'$  - 0 ?Wound Tracing (instead of photographs) ?'[]'$  - 0 ?Simple Wound Measurement - one  wound ?X- 2 5 ?Complex Wound Measurement - multiple wounds ?INTERVENTIONS - Wound Dressings ?'[]'$  - Small Wound Dressing one or multiple wounds 0 ?X- 2 15 ?Medium Wound Dressing one or multiple wounds ?'[]'$  - 0 ?Large Wound Dressing one or multiple wounds ?'[]'$  - 0 ?Application of Medications - topical ?'[]'$  - 0 ?Application of Medications - injection ?INTERVENTIONS - Miscellaneous ?'[]'$  - External ear exam 0 ?'[]'$  - 0 ?Specimen Collection (cultures, biopsies, blood, body fluids, etc.) ?'[]'$  - 0 ?Specimen(s) / Culture(s) sent or taken to Lab for analysis ?'[]'$  - 0 ?Patient Transfer (multiple staff / Civil Service fast streamer / Similar devices) ?'[]'$  - 0 ?Simple Staple / Suture removal (25 or less) ?'[]'$  - 0 ?Complex Staple / Suture removal (26 or more) ?'[]'$  - 0 ?Hypo / Hyperglycemic Management (close monitor of Blood Glucose) ?'[]'$  - 0 ?Ankle / Brachial Index (ABI) - do not check if billed separately ?X- 1 5 ?Vital Signs ?Has the patient been seen at the hospital within the last three years: Yes ?Total Score: 110 ?Level Of Care: New/Established - Level ?3 ?Electronic Signature(s) ?Signed: 02/05/2022 3:51:04 PM By: Levora Dredge ?Entered By: Levora Dredge on 02/05/2022 14:33:35 ?[place], [name] ([id number]) ?-------------------------------------------------------------------------------- ?Encounter Discharge Information Details ?Patient Name: [name], [name] ?Date of Service: 02/05/2022 9:45 AM ?Medical Record Number: [id number] ?Patient Account Number: [id number] ?Date of Birth/Sex: [birth year] (56 y.o. M) ?Treating RN: Levora Dredge ?Primary Care Channing Savich: Fulton Reek Other Clinician: ?Referring Oyinkansola Truax: Fulton Reek ?Treating Deondrick Searls/Extender: Jeri Cos ?Weeks in Treatment: 3 ?Encounter Discharge Information  Items ?Discharge Condition: Stable ?Ambulatory Status: Wheelchair ?Discharge Destination: Home ?Transportation: Private Auto ?Accompanied By: family ?Schedule Follow-up Appointment: Yes ?Clinical Summary of Care: Patient Declined ?Electronic Signature(s) ?Signed: 02/05/2022 2:34:55 PM By: Levora Dredge ?Entered By: Levora Dredge on 02/05/2022 14:34:55 ?[place], [name] ([id number]) ?-------------------------------------------------------------------------------- ?Lower Extremity Assessment Details ?Patient Name: [name], [name] ?Date of Service: 02/05/2022 9:45 AM ?Medical Record Number: [id number] ?Patient Account Number: [id number] ?Date of Birth/Sex: [birth year] (56 y.o. M) ?Treating RN: Levora Dredge ?Primary Care Gearald Stonebraker: Fulton Reek Other Clinician: ?Referring Marvie Calender: Fulton Reek ?Treating Aniket Paye/Extender: Jeri Cos ?Weeks in Treatment: 3 ?Electronic Signature(s) ?Signed: 02/05/2022 3:51:04 PM By: Levora Dredge ?Entered By: Levora Dredge on 02/05/2022 10:10:06 ?[place], [name] ([id number]) ?-------------------------------------------------------------------------------- ?Multi Wound Chart Details ?Patient Name: [name], [name] ?Date of Service: 02/05/2022 9:45 AM ?Medical Record Number: [id number] ?Patient Account Number: [id number] ?Date of Birth/Sex: 1965-11-28 (56 y.o. M) ?Treating RN: Levora Dredge ?Primary Care Olisa Quesnel: Fulton Reek Other Clinician: ?Referring Jafar Poffenberger: Fulton Reek ?Treating Kionte Baumgardner/Extender: Jeri Cos ?Weeks in Treatment: 3 ?Vital Signs ?Height(in): 60 ?Pulse(bpm): 94 ?Weight(lbs): ?Blood Pressure(mmHg): 103/69 ?Body Mass Index(BMI): ?Temperature(??F): 97.7 ?Respiratory Rate(breaths/min): 18 ?Photos: [N/A:N/A] ?Wound Location: Right Ischial Tuberosity Left Ischial Tuberosity N/A ?Wounding Event: Gradually Appeared Gradually Appeared N/A ?Primary Etiology: Pressure Ulcer Pressure Ulcer N/A ?Comorbid History: Cataracts, Glaucoma, Coronary Cataracts, Glaucoma,  Coronary N/A ?Artery Disease, Hypertension, Artery Disease, Hypertension, ?History of pressure wounds, History of pressure wounds, ?Osteoarthritis, Paraplegia Osteoarthritis, Paraplegia ?Date Acquired: 09/03/2021 01/30/19

## 2022-02-19 ENCOUNTER — Encounter: Payer: Medicare Other | Admitting: Physician Assistant

## 2022-02-19 DIAGNOSIS — L89314 Pressure ulcer of right buttock, stage 4: Secondary | ICD-10-CM | POA: Diagnosis not present

## 2022-02-20 NOTE — Progress Notes (Addendum)
AVERY, KLINGBEIL ([id number]) Visit Report for 02/19/2022 Arrival Information Details Patient Name: [name], [name] Date of Service: 02/19/2022 9:15 AM Medical Record Number: [id number] Patient Account Number: [id number] Date of Birth/Sex: 06/26/1966 (56 y.o. M) Treating RN: Cornell Barman Primary Care Twylla Arceneaux: Fulton Reek Other Clinician: Referring Rosine Solecki: Fulton Reek Treating Deyanira Fesler/Extender: Skipper Cliche in Treatment: 5 Visit Information History Since Last Visit Has Dressing in Place as Prescribed: Yes Patient Arrived: Wheel Chair Pain Present Now: No Arrival Time: 09:29 Accompanied By: self Transfer Assistance: Transfer Board Patient Identification Verified: Yes Secondary Verification Process Completed: Yes Patient Requires Transmission-Based Precautions: No Patient Has Alerts: Yes Patient Alerts: NOT diabetic Electronic Signature(s) Signed: 02/22/2022 4:42:58 PM By: Gretta Cool, BSN, RN, CWS, Kim RN, BSN Entered By: Gretta Cool, BSN, RN, CWS, Kim on 02/19/2022 09:30:23 [name] ([id number]) -------------------------------------------------------------------------------- Clinic Level of Care Assessment Details Patient Name: [name] Date of Service: 02/19/2022 9:15 AM Medical Record Number: [id number] Patient Account Number: [id number] Date of Birth/Sex: 10-21-1965 (55 y.o. M) Treating RN: Cornell Barman Primary Care Normon Pettijohn: Fulton Reek Other Clinician: Referring Jenasia Dolinar: Fulton Reek Treating Arch Methot/Extender: Skipper Cliche in Treatment: 5 Clinic Level of Care Assessment Items TOOL 4 Quantity Score _0  - Use when only an EandM is performed on FOLLOW-UP visit 0 ASSESSMENTS - Nursing Assessment / Reassessment _1  - Reassessment of Co-morbidities (includes updates in patient status) 0 _2  - 0 Reassessment of Adherence to Treatment Plan ASSESSMENTS - Wound and Skin Assessment / Reassessment _3  - Simple Wound Assessment / Reassessment - one wound  0 X- 2 5 Complex Wound Assessment / Reassessment - multiple wounds _4  - 0 Dermatologic / Skin Assessment (not related to wound area) ASSESSMENTS - Focused Assessment _5  - Circumferential Edema Measurements - multi extremities 0 _6  - 0 Nutritional Assessment / Counseling / Intervention _7  - 0 Lower Extremity Assessment (monofilament, tuning fork, pulses) _8  - 0 Peripheral Arterial Disease Assessment (using hand held doppler) ASSESSMENTS - Ostomy and/or Continence Assessment and Care _9  - Incontinence Assessment and Management 0 _10  - 0 Ostomy Care Assessment and Management (repouching, etc.) PROCESS - Coordination of Care X - Simple Patient / Family Education for ongoing care 1 15 _11  - 0 Complex (extensive) Patient / Family Education for ongoing care X- 1 10 Staff obtains Programmer, systems, Records, Test Results / Process Orders _12  - 0 Staff telephones HHA, Nursing Homes / Clarify orders / etc _13  - 0 Routine Transfer to another Facility (non-emergent condition) _14  - 0 Routine Hospital Admission (non-emergent condition) _15  - 0 New Admissions / Biomedical engineer / Ordering NPWT, Apligraf, etc. _16  - 0 Emergency Hospital Admission (emergent condition) X- 1 10 Simple Discharge Coordination _17  - 0 Complex (extensive) Discharge Coordination PROCESS - Special Needs _18  - Pediatric / Minor Patient Management 0 _19  - 0 Isolation Patient Management _20  - 0 Hearing / Language / Visual special needs _21  - 0 Assessment of Community assistance (transportation, D/C planning, etc.) _22  - 0 Additional assistance / Altered mentation _23  - 0 Support Surface(s) Assessment (bed, cushion, seat, etc.) INTERVENTIONS - Wound Cleansing / Measurement [name], [name] ([id number]) _24  - 0 Simple Wound Cleansing - one wound X- 2 5 Complex Wound Cleansing - multiple wounds X- 1 5 Wound Imaging (photographs - any number of wounds) _25  - 0 Wound Tracing (instead of photographs) _26  - 0 Simple Wound  Measurement - one wound X- 2 5 Complex Wound Measurement - multiple wounds INTERVENTIONS - Wound Dressings _27  - Small Wound Dressing one or multiple wounds 0 X- 2 15 Medium Wound  Dressing one or multiple wounds _0  - 0 Large Wound Dressing one or multiple wounds <KGURKYHCWCBJSEGB>_1<\/DVVOHYWVPXTGGYIR>_4  - 0 Application of Medications - topical <WNIOEVOJJKKXFGHW>_2<\/XHBZJIRCVELFYBOF>_7  - 0 Application of Medications - injection INTERVENTIONS - Miscellaneous _3  - External ear exam 0 _4  - 0 Specimen Collection (cultures, biopsies, blood, body fluids, etc.) _5  - 0 Specimen(s) / Culture(s) sent or taken to Lab for analysis X- 1 10 Patient Transfer (multiple staff / Harrel Lemon Lift / Similar devices) _6  - 0 Simple Staple / Suture removal (25 or less) _7  - 0 Complex Staple / Suture removal (26 or more) _8  - 0 Hypo / Hyperglycemic Management (close monitor of Blood Glucose) _9  - 0 Ankle / Brachial Index (ABI) - do not check if billed separately X- 1 5 Vital Signs Has the patient been seen at the hospital within the last three years: Yes Total Score: 115 Level Of Care: New/Established - Level 3 Electronic Signature(s) Signed: 02/22/2022 4:42:58 PM By: Gretta Cool, BSN, RN, CWS, Kim RN, BSN Entered By: Gretta Cool, BSN, RN, CWS, Kim on 02/19/2022 10:02:14 [name] ([id number]) -------------------------------------------------------------------------------- Encounter Discharge Information Details Patient Name: [name] Date of Service: 02/19/2022 9:15 AM Medical Record Number: [id number] Patient Account Number: [id number] Date of Birth/Sex: [birth year] (55 y.o. M) Treating RN: Cornell Barman Primary Care Alix Lahmann: Fulton Reek Other Clinician: Referring Henretter Piekarski: Fulton Reek Treating Harlowe Dowler/Extender: Skipper Cliche in Treatment: 5 Encounter Discharge Information Items Discharge Condition: Stable Ambulatory Status: Ambulatory Discharge Destination: Home Transportation: Private Auto Accompanied By: self Schedule Follow-up Appointment:  Yes Clinical Summary of Care: Electronic Signature(s) Signed: 02/22/2022 4:42:58 PM By: Gretta Cool, BSN, RN, CWS, Kim RN, BSN Entered By: Gretta Cool, BSN, RN, CWS, Kim on 02/19/2022 10:03:07 [name] ([id number]) -------------------------------------------------------------------------------- Lower Extremity Assessment Details Patient Name: [name] Date of Service: 02/19/2022 9:15 AM Medical Record Number: [id number] Patient Account Number: [id number] Date of Birth/Sex: 1966/02/11 (55 y.o. M) Treating RN: Cornell Barman Primary Care Ritha Sampedro: Fulton Reek Other Clinician: Referring Jariana Shumard: Fulton Reek Treating Kristinia Leavy/Extender: Skipper Cliche in Treatment: 5 Electronic Signature(s) Signed: 02/22/2022 4:42:58 PM By: Gretta Cool, BSN, RN, CWS, Kim RN, BSN Entered By: Gretta Cool, BSN, RN, CWS, Kim on 02/19/2022 09:42:53 [name] ([id number]) -------------------------------------------------------------------------------- Multi Wound Chart Details Patient Name: [name] Date of Service: 02/19/2022 9:15 AM Medical Record Number: [id number] Patient Account Number: [id number] Date of Birth/Sex: 02-Jan-1966 (55 y.o. M) Treating RN: Cornell Barman Primary Care Clifford Coudriet: Fulton Reek Other Clinician: Referring Alana Dayton: Fulton Reek Treating Muscab Brenneman/Extender: Skipper Cliche in Treatment: 5 Vital Signs Height(in): 60 Pulse(bpm): 61 Weight(lbs): Blood Pressure(mmHg): 105/78 Body Mass Index(BMI): Temperature(F): 97.8 Respiratory Rate(breaths/min): 18 Photos: [3:No Photos] [4:No Photos] [N/A:N/A] Wound Location: [3:Right Ischial Tuberosity] [4:Left Ischial Tuberosity] [N/A:N/A] Wounding Event: [3:Gradually Appeared] [4:Gradually Appeared] [N/A:N/A] Primary Etiology: [3:Pressure Ulcer] [4:Pressure Ulcer] [N/A:N/A] Date Acquired: [3:09/03/2021] [4:01/29/2022] [N/A:N/A] Weeks of Treatment: [3:5] [4:2] [N/A:N/A] Wound Status: [3:Open] [4:Open] [N/A:N/A] Wound  Recurrence: [3:No] [4:No] [N/A:N/A] Measurements L x W x D (cm) [3:3.2x6.8x1.8] [4:3x2x0.8] [N/A:N/A] Area (cm) : [3:17.09] [4:4.712] [N/A:N/A] Volume (cm) : [3:30.762] [4:3.77] [N/A:N/A] % Reduction in Area: [3:2.90%] [4:-300.00%] [N/A:N/A] % Reduction in Volume: [3:30.10%] [4:-14.30%] [N/A:N/A] Classification: [3:Category/Stage IV] [4:Category/Stage IV] [N/A:N/A] Exudate Amount: [3:Large] [4:Medium] [N/A:N/A] Exudate Type: [3:Serosanguineous red, brown] [4:Serosanguineous red, brown] [N/A:N/A N/A] Treatment Notes Electronic Signature(s) Signed: 02/22/2022 4:42:58 PM By: Gretta Cool, BSN, RN, CWS, Kim RN, BSN Entered By: Gretta Cool, BSN, RN, CWS, Kim on 02/19/2022 10:00:59 [name] ([id number]) -------------------------------------------------------------------------------- [place] Details Patient Name: [name] Date of Service: 02/19/2022 9:15 AM Medical Record Number: [id number] Patient Account Number:  [id number] Date of Birth/Sex: December 15, 1965 (56 y.o. M) Treating RN: Cornell Barman Primary Care Lorimer Tiberio: Fulton Reek Other Clinician: Referring Bandon Sherwin: Fulton Reek Treating Deneen Slager/Extender: Skipper Cliche in Treatment: 5 Active Inactive Pressure Nursing Diagnoses: Knowledge deficit related to causes and risk factors for pressure ulcer development Knowledge deficit related to management of pressures ulcers Potential for impaired tissue integrity related to pressure, friction, moisture, and shear Goals: Patient will remain free from development of additional pressure ulcers Date Initiated: 01/15/2022 Target Resolution Date: 02/26/2022 Goal Status: Active Patient/caregiver will verbalize risk factors for pressure ulcer development Date Initiated: 01/15/2022 Target Resolution Date: 03/05/2022 Goal Status: Active Interventions: Assess: immobility, friction, shearing, incontinence upon admission and as needed Assess offloading mechanisms upon  admission and as needed Assess potential for pressure ulcer upon admission and as needed Notes: Electronic Signature(s) Signed: 02/22/2022 4:42:58 PM By: Gretta Cool, BSN, RN, CWS, Kim RN, BSN Entered By: Gretta Cool, BSN, RN, CWS, Kim on 02/19/2022 10:00:52 [name] ([id number]) -------------------------------------------------------------------------------- Pain Assessment Details Patient Name: [name] Date of Service: 02/19/2022 9:15 AM Medical Record Number: [id number] Patient Account Number: [id number] Date of Birth/Sex: August 04, 1966 (55 y.o. M) Treating RN: Cornell Barman Primary Care Adrien Shankar: Fulton Reek Other Clinician: Referring Kerim Statzer: Fulton Reek Treating Koleson Reifsteck/Extender: Skipper Cliche in Treatment: 5 Active Problems Location of Pain Severity and Description of Pain Patient Has Paino No Site Locations Pain Management and Medication Current Pain Management: Electronic Signature(s) Signed: 02/22/2022 4:42:58 PM By: Gretta Cool, BSN, RN, CWS, Kim RN, BSN Entered By: Gretta Cool, BSN, RN, CWS, Kim on 02/19/2022 09:31:01 [name] ([id number]) -------------------------------------------------------------------------------- Patient/Caregiver Education Details Patient Name: [name] Date of Service: 02/19/2022 9:15 AM Medical Record Number: [id number] Patient Account Number: [id number] Date of Birth/Gender: 03-07-1966 (55 y.o. M) Treating RN: Cornell Barman Primary Care Physician: Fulton Reek Other Clinician: Referring Physician: Fulton Reek Treating Physician/Extender: Skipper Cliche in Treatment: 5 Education Assessment Education Provided To: Patient Education Topics Provided Wound/Skin Impairment: Handouts: Other: continue wound care as prescribed Methods: Demonstration, Explain/Verbal, Printed Responses: State content correctly Electronic Signature(s) Signed: 02/22/2022 4:42:58 PM By: Gretta Cool, BSN, RN, CWS, Kim RN, BSN Entered By: Gretta Cool,  BSN, RN, CWS, Kim on 02/19/2022 10:02:40 [name] ([id number]) -------------------------------------------------------------------------------- Wound Assessment Details Patient Name: [name] Date of Service: 02/19/2022 9:15 AM Medical Record Number: [id number] Patient Account Number: [id number] Date of Birth/Sex: [birth year] (55 y.o. M) Treating RN: Cornell Barman Primary Care Jerman Tinnon: Fulton Reek Other Clinician: Referring Jodeen Mclin: Fulton Reek Treating Dominique Calvey/Extender: Skipper Cliche in Treatment: 5 Wound Status Wound Number: 3 Primary Etiology: Pressure Ulcer Wound Location: Right Ischial Tuberosity Wound Status: Open Wounding Event: Gradually Appeared Date Acquired: 09/03/2021 Weeks Of Treatment: 5 Clustered Wound: No Wound Measurements Length: (cm) 3.2 Width: (cm) 6.8 Depth: (cm) 1.8 Area: (cm) 17.09 Volume: (cm) 30.762 % Reduction in Area: 2.9% % Reduction in Volume: 30.1% Wound Description Classification: Category/Stage IV Exudate Amount: Large Exudate Type: Serosanguineous Exudate Color: red, brown Treatment Notes Wound #3 (Ischial Tuberosity) Wound Laterality: Right Cleanser [place] Ancillary Kit - 15 Day Supply Discharge Instruction: Use supplies as instructed; Kit contains: (15) Saline Bullets; (15) 3x3 Gauze; 15 pr Gloves Normal Saline Discharge Instruction: Wash your hands with soap and water. Remove old dressing, discard into plastic bag and place into trash. Cleanse the wound with Normal Saline prior to applying a clean dressing using gauze sponges, not tissues or cotton balls. Do not scrub or use excessive force. Pat dry using gauze sponges, not tissue or cotton balls. Wound Cleanser Discharge Instruction: Wash your hands with  soap and water. Remove old dressing, discard into plastic bag and place into trash. Cleanse the wound with Wound Cleanser prior to applying a clean dressing using gauze sponges, not tissues or cotton balls.  Do not scrub or use excessive force. Pat dry using gauze sponges, not tissue or cotton balls. Peri-Wound Care Topical Primary Dressing Gauze Discharge Instruction: saline Wet to Dry applied in the office until the wound VAC is replaced by home health Secondary Dressing ABD Pad 5x9 (in/in) Discharge Instruction: Cover with ABD pad Secured With [place] Soft Cloth Surgical Tape, 2x2 (in/yd) Compression Wrap Compression Stockings [place] ([id number]) Add-Ons Electronic Signature(s) Signed: 02/22/2022 4:42:58 PM By: Gretta Cool, BSN, RN, CWS, Kim RN, BSN Entered By: Gretta Cool, BSN, RN, CWS, Kim on 02/19/2022 [place] [name] ([id number]) -------------------------------------------------------------------------------- Wound Assessment Details Patient Name: [name] Date of Service: 02/19/2022 9:15 AM Medical Record Number: [id number] Patient Account Number: [id number] Date of Birth/Sex: 21-Dec-1965 (55 y.o. M) Treating RN: Cornell Barman Primary Care Rashon Westrup: Fulton Reek Other Clinician: Referring Breydon Senters: Fulton Reek Treating Kayliana Codd/Extender: Skipper Cliche in Treatment: 5 Wound Status Wound Number: 4 Primary Etiology: Pressure Ulcer Wound Location: Left Ischial Tuberosity Wound Status: Open Wounding Event: Gradually Appeared Date Acquired: 01/29/2022 Weeks Of Treatment: 2 Clustered Wound: No Photos Photo Uploaded By: Gretta Cool, BSN, RN, CWS, Kim on 02/19/2022 12:59:56 Wound Measurements Length: (cm) 3 Width: (cm) 2 Depth: (cm) 0.8 Area: (cm) 4.712 Volume: (cm) 3.77 % Reduction in Area: -300% % Reduction in Volume: -14.3% Wound Description Classification: Category/Stage IV Exudate Amount: Medium Exudate Type: Serosanguineous Exudate Color: red, brown Treatment Notes Wound #4 (Ischial Tuberosity) Wound Laterality: Left Cleanser Normal Saline Discharge Instruction: Wash your hands with soap and water. Remove old dressing,  discard into plastic bag and place into trash. Cleanse the wound with Normal Saline prior to applying a clean dressing using gauze sponges, not tissues or cotton balls. Do not scrub or use excessive force. Pat dry using gauze sponges, not tissue or cotton balls. Wound Cleanser Discharge Instruction: Wash your hands with soap and water. Remove old dressing, discard into plastic bag and place into trash. Cleanse the wound with Wound Cleanser prior to applying a clean dressing using gauze sponges, not tissues or cotton balls. Do not scrub or use excessive force. Pat dry using gauze sponges, not tissue or cotton balls. Peri-Wound Care Topical Primary Dressing TALON, REGALA ([id number]) Hydrofera Blue Ready Transfer Foam, 2.5x2.5 (in/in) Discharge Instruction: Apply Hydrofera Blue Ready to wound bed as directed Secondary Dressing (SILCONE BORDER) [place] Dressing 5x5 (in/in) Discharge Instruction: Please do not put silicone bordered dressings under wraps. Use non-bordered dressing only. Secured With Compression Wrap Compression Stockings Environmental education officer) Signed: 02/22/2022 4:42:58 PM By: Gretta Cool, BSN, RN, CWS, Kim RN, BSN Entered By: Gretta Cool, BSN, RN, CWS, Kim on 02/19/2022 09:41:21 [name] ([id number]) -------------------------------------------------------------------------------- Vitals Details Patient Name: [name] Date of Service: 02/19/2022 9:15 AM Medical Record Number: [id number] Patient Account Number: [id number] Date of Birth/Sex: 26-Sep-1966 (55 y.o. M) Treating RN: Cornell Barman Primary Care Liandra Mendia: Fulton Reek Other Clinician: Referring Raeden Belzer: Fulton Reek Treating Maryella Abood/Extender: Skipper Cliche in Treatment: 5 Vital Signs Time Taken: 09:30 Temperature (F): 97.8 Height (in): 60 Pulse (bpm): 96 Respiratory Rate (breaths/min): 18 Blood Pressure (mmHg): 105/78 Reference Range: 80 - 120 mg /  dl Electronic Signature(s) Signed: 02/22/2022 4:42:58 PM By: Gretta Cool, BSN, RN, CWS, Kim RN, BSN Entered By: Gretta Cool, BSN, RN, CWS, Kim on 02/19/2022 09:30:47

## 2022-02-20 NOTE — Progress Notes (Addendum)
BASEM, YANNUZZI ([id number]) Visit Report for 02/19/2022 Chief Complaint Document Details Patient Name: [name], [name] Date of Service: 02/19/2022 9:15 AM Medical Record Number: [id number] Patient Account Number: [id number] Date of Birth/Sex: 08-Nov-1965 (56 y.o. M) Treating RN: Cornell Barman Primary Care Provider: Fulton Reek Other Clinician: Referring Provider: Fulton Reek Treating Provider/Extender: Skipper Cliche in Treatment: 5 Information Obtained from: Patient Chief Complaint Right ischial tuberosity pressure ulcer Electronic Signature(s) Signed: 02/19/2022 9:28:09 AM By: Worthy Keeler PA-C Entered By: Worthy Keeler on 02/19/2022 09:28:09 [name] ([id number]) -------------------------------------------------------------------------------- HPI Details Patient Name: [name] Date of Service: 02/19/2022 9:15 AM Medical Record Number: [id number] Patient Account Number: [id number] Date of Birth/Sex: 12/23/1965 (55 y.o. M) Treating RN: Cornell Barman Primary Care Provider: Fulton Reek Other Clinician: Referring Provider: Fulton Reek Treating Provider/Extender: Skipper Cliche in Treatment: 5 History of Present Illness HPI Description: 56 year old male with a history of spina bifida presenting to [place] with a history of a open wound on the left gluteal region near his upper thigh which she's had for several weeks. He was seen in the ED recently at [facility] and was put on doxycycline and was advised some local care. his past medical history is significant for spinal bifid, neurogenic bladder, kidney stones, sacral pressure sores, constipation. Past surgical history significant for partial cystectomy, ileal conduit, VP shunt removal, percutaneous nephro lithotripsy. In the remote past the patient says he's had some treatment for a ulcer on this area and was treated with a skin graft. Readmission: 10/16/2021 upon evaluation today patient  appears to be doing somewhat poorly in regard to a fairly large wound noted over the right ischial tuberosity location. This is a stage IV pressure ulcer. Of note this is also positive for osteomyelitis upon a CT scan which was performed during the time that he was admitted in the hospital on 09/19/2021. With that being said it is noted in the right inferior buttock that he has a decubitus ulcer which extends to the posterior toe inferior right ischium where there is evidence of osteomyelitis. When he was here in the office actually missed that this was positive I missed read it and thought it said that there was no osteomyelitis. That is not the case there is in fact osteomyelitis noted here. I actually did review his notes as well in epic. Looking to the discharge summary it appears that the patient was admitted from 09/19/2021 through 09/23/2021. He was discharged home with home health care according to the note. With that being said from what I understand his sister actually takes care of him at this point. He was also to follow-up with a primary care provider and here at the wound care center within a week. I am just now seeing him on the 13th almost a month after discharge. He does have a history of again osteomyelitis of this right hip/ischial tuberosity location. 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 The reason for his admission was actually worsening of the decubitus ulcer upon admission. He does have chronic osteomyelitis of this area. He was treated with empiric antibiotics he had acute kidney injury which improved with IV fluids he also had hypokalemia. Surgical debridement was performed by general surgery at that time and ID was consulted to assist with management according to the notes. IV antibiotics were recommended  but patient  declined and wanted oral antibiotics at discharge. Therefore no IV antibiotics were planned for discharge time. This case was under the care of Dr. Steva Ready while he was in the hospital when she did discharge him with a 2-week course of doxycycline and Augmentin according to the notes. It looks like Dr. Doy Hutching office did call and leave a message for the patient to get scheduled for a follow-up visit after hospital discharge I do not see where he showed up in fact it appears that the patient has a no- show letter from Dr. Doy Hutching office noted in epic due to a appointment that was missed on 10/06/2021. Looking at the pictures from Dr. Raelene Bott notes on 09/23/2021 it does appear that the wound is a little bit better as far as the cleanliness of the surface of the wound today but nonetheless still is a very significant wound. 10/26/2021 upon evaluation today patient appears to be doing okay in regard to his wound. Again this is a wound that he did indeed have osteomyelitis and previous. With that being said I do not see any signs of a worsening infection at this time which is good news. Overall I think that we are headed in the right direction although still I think he needs more aggressive offloading. 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 I do believe this would be ideal for him to be honest. Readmission: 01-15-2022 upon evaluation today patient presents for reevaluation he was last seen on October 26, 2021. At that time unfortunately he was having issues with a significant ischial pressure ulcer on the right ischial tuberosity. Subsequently he ended up having decent response to the Dakin's moistened gauze dressing that was previously being utilized and subsequently the patient's sister who is his primary caregiver as well states that she decided just to try to manage this at home. Nonetheless unfortunately this is getting a little larger not better. He does spend a lot of  the day in his chair. He does have an air mattress according what they tell me today. That was previously ordered. With that being said I do believe that at this time things do appear to be doing much better from the standpoint of infection I do not see any signs of overt infection. There is also no evidence of systemic infection. No fevers, chills, nausea, vomiting, or diarrhea. With that being said I do believe based on what I am seeing the wound is a bit cleaner and he possibly could be an excellent candidate for a wound VAC. 02-05-2022 upon evaluation today patient appears to be doing about the same in regard to his wound. Unfortunately he is not any better but he also really has not had the wound VAC on sufficiently. There is been some confusion with the orders part of that I think is our follow-up with the way the order was written part of it may be with how the wound VAC is being placed either way we need to see what we can do to try to improve things in this regard. We will clarify the orders today going forward for home health. Unfortunately the patient does have a new wound today which is on the opposite side. This is not good 1 was bad enough have another area to open is definitely not a good thing. Its not as deep but nonetheless is also not very shallow either. We may potentially be able to get this to the point that  we could bridge the 2 together in order to wound VAC both but right now want a focus on to try to get the wound VAC going for the main wound initially we will likely use Hydrofera Blue on the new wound.Marland Kitchen 02-19-2022 upon evaluation today patient's wound appears to be doing a little bit worse in the way of maceration fortunately there is no signs of active infection locally or systemically which is great news. No fevers, chills, nausea, vomiting, or diarrhea. Electronic Signature(s) Signed: 02/19/2022 10:10:02 AM By: Worthy Keeler PA-C Entered By: Worthy Keeler on 02/19/2022  10:10:02 [name] ([id number]) [name], [name] ([id number]) -------------------------------------------------------------------------------- Physical Exam Details Patient Name: [name] Date of Service: 02/19/2022 9:15 AM Medical Record Number: [id number] Patient Account Number: [id number] Date of Birth/Sex: [birth year] (55 y.o. M) Treating RN: Cornell Barman Primary Care Provider: Fulton Reek Other Clinician: Referring Provider: Fulton Reek Treating Provider/Extender: Skipper Cliche in Treatment: 5 Constitutional Well-nourished and well-hydrated in no acute distress. Respiratory normal breathing without difficulty. Psychiatric this patient is able to make decisions and demonstrates good insight into disease process. Alert and Oriented x 3. pleasant and cooperative. Notes Upon inspection patient's wound bed does show signs again of maceration in regard to the main wound and I do not think the wound vacs been functioning properly tells me the beeping is almost constant. I am not sure if is not being put on right if there is been a problem on the machine or otherwise. There could be switching this out. Nonetheless in the meantime I do believe that the patient needs to be offloading appropriately we will going continue with the [facility] for the opposite side. Electronic Signature(s) Signed: 02/19/2022 10:10:27 AM By: Worthy Keeler PA-C Entered By: Worthy Keeler on 02/19/2022 10:10:27 [name] ([id number]) -------------------------------------------------------------------------------- Physician Orders Details Patient Name: [name] Date of Service: 02/19/2022 9:15 AM Medical Record Number: [id number] Patient Account Number: [id number] Date of Birth/Sex: 1966-06-22 (55 y.o. M) Treating RN: Cornell Barman Primary Care Provider: Fulton Reek Other Clinician: Referring Provider: Fulton Reek Treating Provider/Extender: Skipper Cliche  in Treatment: 5 Verbal / Phone Orders: No Diagnosis Coding ICD-10 Coding Code Description L89.314 Pressure ulcer of right buttock, stage 4 L89.323 Pressure ulcer of left buttock, stage 3 Q76.0 Spina bifida occulta Z93.3 Colostomy status I10 Essential (primary) hypertension I25.10 Atherosclerotic heart disease of native coronary artery without angina pectoris Follow-up Appointments o Return Appointment in 2 weeks. o Nurse Visit as needed [place]: - [facility] o ADMIT to [facility] for wound care. May utilize formulary equivalent dressing for wound treatment orders unless otherwise specified. Home Health Nurse may visit PRN to address patientos wound care needs. o Scheduled days for dressing changes to be completed; exception, patient has scheduled wound care visit that day. o **Please direct any NON-WOUND related issues/requests for orders to patient's Primary Care Physician. **If current dressing causes regression in wound condition, may D/C ordered dressing product/s and apply Normal Saline Moist Dressing daily until next [place] or Other MD appointment. **Notify [facility] of regression in wound condition at [phone]. Bathing/ Shower/ Hygiene o Clean wound with Normal Saline or wound cleanser. - keep dressing dry or change after shower o No tub bath. Off-Loading o Gel wheelchair cushion o Hospital bed/mattress - Has at home o Air fluidized (Group 3) - Has from [employer] and reposition every 2 hours - Do not sit in wheelchair any longer than you have  to--offload wound in bed as much as possible Additional Orders / Instructions o Follow Nutritious Diet and Increase Protein Intake o Other: - Refer to ostomy clinic in [facility] if desired Negative Pressure Wound Therapy Wound #3 Right Ischial Tuberosity o Wound VAC settings at 1110mHg continuous pressure. Use foam to wound cavity. Please order  WHITE foam to fill any tunnel/s and/or undermining when necessary. Change VAC dressing 3 X WEEK. Change canister as indicated when full. - Change Monday, Wednesday, and Friday o Home Health Nurse may d/c VAC for s/s of increased infection, significant wound regression, or uncontrolled drainage. [phone] o Number of foam/gauze pieces used in the dressing = - 1 piece of black foam place into the wound bed. This needs to be bridged to the side of the abdomen/hip area so that the patient will not be sitting on the suction pad and tubing when up in his chair. This will also help the suction pad to stay in place. Wound Treatment Wound #3 - Ischial Tuberosity Wound Laterality: Right Cleanser: Byram Ancillary Kit - 15 Day Supply (Generic) 1 x Per Day/30 Days Discharge Instructions: Use supplies as instructed; Kit contains: (15) Saline Bullets; (15) 3x3 Gauze; 15 pr Gloves Cleanser: Normal Saline 1 x Per Day/30 Days [name], [name]([id number] Discharge Instructions: Wash your hands with soap and water. Remove old dressing, discard into plastic bag and place into trash. Cleanse the wound with Normal Saline prior to applying a clean dressing using gauze sponges, not tissues or cotton balls. Do not scrub or use excessive force. Pat dry using gauze sponges, not tissue or cotton balls. Cleanser: Wound Cleanser 1 x Per Day/30 Days Discharge Instructions: Wash your hands with soap and water. Remove old dressing, discard into plastic bag and place into trash. Cleanse the wound with Wound Cleanser prior to applying a clean dressing using gauze sponges, not tissues or cotton balls. Do not scrub or use excessive force. Pat dry using gauze sponges, not tissue or cotton balls. Primary Dressing: Gauze (Generic) 1 x Per Day/30 Days Discharge Instructions: saline Wet to Dry applied in the office until the wound VAC is replaced by home health Secondary Dressing: ABD Pad 5x9  (in/in) (Generic) 1 x Per Day/30 Days Discharge Instructions: Cover with ABD pad Secured With: Medipore Tape - 89M Medipore H Soft Cloth Surgical Tape, 2x2 (in/yd) (Generic) 1 x Per Day/30 Days Wound #4 - Ischial Tuberosity Wound Laterality: Left Cleanser: Normal Saline 3 x Per Week/30 Days Discharge Instructions: Wash your hands with soap and water. Remove old dressing, discard into plastic bag and place into trash. Cleanse the wound with Normal Saline prior to applying a clean dressing using gauze sponges, not tissues or cotton balls. Do not scrub or use excessive force. Pat dry using gauze sponges, not tissue or cotton balls. Cleanser: Wound Cleanser 3 x Per Week/30 Days Discharge Instructions: Wash your hands with soap and water. Remove old dressing, discard into plastic bag and place into trash. Cleanse the wound with Wound Cleanser prior to applying a clean dressing using gauze sponges, not tissues or cotton balls. Do not scrub or use excessive force. Pat dry using gauze sponges, not tissue or cotton balls. Primary Dressing: Hydrofera Blue Ready Transfer Foam, 2.5x2.5 (in/in) 3 x Per Week/30 Days Discharge Instructions: Apply Hydrofera Blue Ready to wound bed as directed Secondary Dressing: (SILCONE BORDER) Zetuvit Plus SILICONE BORDER Dressing 5x5 (in/in) 3 x Per Week/30 Days Discharge Instructions: Please do not put silicone bordered dressings  under wraps. Use non-bordered dressing only. Electronic Signature(s) Signed: 02/19/2022 1:47:08 PM By: Worthy Keeler PA-C Signed: 02/22/2022 4:42:58 PM By: Gretta Cool BSN, RN, CWS, Kim RN, BSN Entered By: Gretta Cool, BSN, RN, CWS, Kim on 02/19/2022 10:01:33 [name] ([id number]) -------------------------------------------------------------------------------- Problem List Details Patient Name: [name] Date of Service: 02/19/2022 9:15 AM Medical Record Number: [id number] Patient Account Number: [id number] Date of Birth/Sex: 13-Sep-1966 (55  y.o. M) Treating RN: Cornell Barman Primary Care Provider: Fulton Reek Other Clinician: Referring Provider: Fulton Reek Treating Provider/Extender: Skipper Cliche in Treatment: 5 Active Problems ICD-10 Encounter Code Description Active Date MDM Diagnosis L89.314 Pressure ulcer of right buttock, stage 4 01/15/2022 No Yes L89.323 Pressure ulcer of left buttock, stage 3 02/05/2022 No Yes Q76.0 Spina bifida occulta 01/15/2022 No Yes Z93.3 Colostomy status 01/15/2022 No Yes I10 Essential (primary) hypertension 01/15/2022 No Yes I25.10 Atherosclerotic heart disease of native coronary artery without angina 01/15/2022 No Yes pectoris Inactive Problems Resolved Problems Electronic Signature(s) Signed: 02/19/2022 9:28:06 AM By: Worthy Keeler PA-C Entered By: Worthy Keeler on 02/19/2022 09:28:05 [name] ([id number]) -------------------------------------------------------------------------------- Progress Note Details Patient Name: [name] Date of Service: 02/19/2022 9:15 AM Medical Record Number: [id number] Patient Account Number: [id number] Date of Birth/Sex: Feb 07, 1966 (55 y.o. M) Treating RN: Cornell Barman Primary Care Provider: Fulton Reek Other Clinician: Referring Provider: Fulton Reek Treating Provider/Extender: Skipper Cliche in Treatment: 5 Subjective Chief Complaint Information obtained from Patient Right ischial tuberosity pressure ulcer History of Present Illness (HPI) 56 year old male with a history of spina bifida presenting to [place] with a history of a open wound on the left gluteal region near his upper thigh which she's had for several weeks. He was seen in the ED recently at [facility] and was put on doxycycline and was advised some local care. his past medical history is significant for spinal bifid, neurogenic bladder, kidney stones, sacral pressure sores, constipation. Past surgical history significant for partial cystectomy,  ileal conduit, VP shunt removal, percutaneous nephro lithotripsy. In the remote past the patient says he's had some treatment for a ulcer on this area and was treated with a skin graft. Readmission: 10/16/2021 upon evaluation today patient appears to be doing somewhat poorly in regard to a fairly large wound noted over the right ischial tuberosity location. This is a stage IV pressure ulcer. Of note this is also positive for osteomyelitis upon a CT scan which was performed during the time that he was admitted in the hospital on 09/19/2021. With that being said it is noted in the right inferior buttock that he has a decubitus ulcer which extends to the posterior toe inferior right ischium where there is evidence of osteomyelitis. When he was here in the office actually missed that this was positive I missed read it and thought it said that there was no osteomyelitis. That is not the case there is in fact osteomyelitis noted here. I actually did review his notes as well in epic. Looking to the discharge summary it appears that the patient was admitted from 09/19/2021 through 09/23/2021. He was discharged home with home health care according to the note. With that being said from what I understand his sister actually takes care of him at this point. He was also to follow-up with a primary care provider and here at the wound care center within a week. I am just now seeing him on the 13th almost a month after discharge. He does have a history of again osteomyelitis of this right  hip/ischial tuberosity location. 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 The reason for his admission was actually worsening of the decubitus ulcer upon admission. He does have chronic osteomyelitis of this area. He was treated with empiric antibiotics he had acute kidney injury  which improved with IV fluids he also had hypokalemia. Surgical debridement was performed by general surgery at that time and ID was consulted to assist with management according to the notes. IV antibiotics were recommended but patient declined and wanted oral antibiotics at discharge. Therefore no IV antibiotics were planned for discharge time. This case was under the care of Dr. Steva Ready while he was in the hospital when she did discharge him with a 2-week course of doxycycline and Augmentin according to the notes. It looks like Dr. Doy Hutching office did call and leave a message for the patient to get scheduled for a follow-up visit after hospital discharge I do not see where he showed up in fact it appears that the patient has a no- show letter from Dr. Doy Hutching office noted in epic due to a appointment that was missed on 10/06/2021. Looking at the pictures from Dr. Raelene Bott notes on 09/23/2021 it does appear that the wound is a little bit better as far as the cleanliness of the surface of the wound today but nonetheless still is a very significant wound. 10/26/2021 upon evaluation today patient appears to be doing okay in regard to his wound. Again this is a wound that he did indeed have osteomyelitis and previous. With that being said I do not see any signs of a worsening infection at this time which is good news. Overall I think that we are headed in the right direction although still I think he needs more aggressive offloading. 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 I do believe this would be ideal for him to be honest. Readmission: 01-15-2022 upon evaluation today patient presents for reevaluation he was last seen on October 26, 2021. At that time unfortunately he was having issues with a significant ischial pressure ulcer on the right ischial tuberosity. Subsequently he ended up having decent response to the Dakin's moistened gauze dressing that was previously being  utilized and subsequently the patient's sister who is his primary caregiver as well states that she decided just to try to manage this at home. Nonetheless unfortunately this is getting a little larger not better. He does spend a lot of the day in his chair. He does have an air mattress according what they tell me today. That was previously ordered. With that being said I do believe that at this time things do appear to be doing much better from the standpoint of infection I do not see any signs of overt infection. There is also no evidence of systemic infection. No fevers, chills, nausea, vomiting, or diarrhea. With that being said I do believe based on what I am seeing the wound is a bit cleaner and he possibly could be an excellent candidate for a wound VAC. 02-05-2022 upon evaluation today patient appears to be doing about the same in regard to his wound. Unfortunately he is not any better but he also really has not had the wound VAC on sufficiently. There is been some confusion with the orders part of that I think is our follow-up  with the way the order was written part of it may be with how the wound VAC is being placed either way we need to see what we can do to try to improve things in this regard. We will clarify the orders today going forward for home health. Unfortunately the patient does have a new wound today which is on the opposite side. This is not good 1 was bad enough have another area to open is definitely not a good thing. Its not as deep but nonetheless is also not very shallow either. We may potentially be able to get this to the point that we could bridge the 2 together in order to wound VAC both but right now want a focus on to try to get the wound VAC going for the main wound initially we will likely use Hydrofera Blue on the new wound.Marland Kitchen 02-19-2022 upon evaluation today patient's wound appears to be doing a little bit worse in the way of maceration fortunately there is no signs  of active infection locally or systemically which is great news. No fevers, chills, nausea, vomiting, or diarrhea. [place], [name] ([id number]) Objective Constitutional Well-nourished and well-hydrated in no acute distress. Vitals Time Taken: 9:30 AM, Height: 60 in, Temperature: 97.8 F, Pulse: 96 bpm, Respiratory Rate: 18 breaths/min, Blood Pressure: 105/78 mmHg. Respiratory normal breathing without difficulty. Psychiatric this patient is able to make decisions and demonstrates good insight into disease process. Alert and Oriented x 3. pleasant and cooperative. General Notes: Upon inspection patient's wound bed does show signs again of maceration in regard to the main wound and I do not think the wound vacs been functioning properly tells me the beeping is almost constant. I am not sure if is not being put on right if there is been a problem on the machine or otherwise. There could be switching this out. Nonetheless in the meantime I do believe that the patient needs to be offloading appropriately we will going continue with the [facility] for the opposite side. Integumentary (Hair, Skin) Wound #3 status is Open. Original cause of wound was Gradually Appeared. The date acquired was: 09/03/2021. The wound has been in treatment 5 weeks. The wound is located on the Right Ischial Tuberosity. The wound measures 3.2cm length x 6.8cm width x 1.8cm depth; 17.09cm^2 area and 30.762cm^3 volume. There is a large amount of serosanguineous drainage noted. Wound #4 status is Open. Original cause of wound was Gradually Appeared. The date acquired was: 01/29/2022. The wound has been in treatment 2 weeks. The wound is located on the Left Ischial Tuberosity. The wound measures 3cm length x 2cm width x 0.8cm depth; 4.712cm^2 area and 3.77cm^3 volume. There is a medium amount of serosanguineous drainage noted. Assessment Active Problems ICD-10 Pressure ulcer of right buttock, stage 4 Pressure ulcer of  left buttock, stage 3 Spina bifida occulta Colostomy status Essential (primary) hypertension Atherosclerotic heart disease of native coronary artery without angina pectoris Plan Follow-up Appointments: Return Appointment in 2 weeks. Nurse Visit as needed Home Health: [place]: - [facility] ADMIT to [facility] for wound care. May utilize formulary equivalent dressing for wound treatment orders unless otherwise specified. Home Health Nurse may visit [facility] to address patient s wound care needs. Scheduled days for dressing changes to be completed; exception, patient has scheduled wound care visit that day. **Please direct any NON-WOUND related issues/requests for orders to patient's Primary Care Physician. **If current dressing causes regression in wound condition, may D/C ordered dressing product/s and apply Normal  Saline Moist Dressing daily until next [place] or Other MD appointment. **Notify [facility] of regression in wound condition at [phone]. Bathing/ Shower/ Hygiene: Clean wound with Normal Saline or wound cleanser. - keep dressing dry or change after shower No tub bath. Off-Loading: [name], [name] ([id number]) Gel wheelchair cushion Hospital bed/mattress - Has at home Air fluidized (Group 3) - Has from [place] and reposition every 2 hours - Do not sit in wheelchair any longer than you have to--offload wound in bed as much as possible Additional Orders / Instructions: Follow Nutritious Diet and Increase Protein Intake Other: - Refer to ostomy clinic in [facility] if desired Negative Pressure Wound Therapy: Wound #3 Right Ischial Tuberosity: Wound VAC settings at 130mHg continuous pressure. Use foam to wound cavity. Please order WHITE foam to fill any tunnel/s and/or undermining when necessary. Change VAC dressing 3 X WEEK. Change canister as indicated when full. - Change Monday, Wednesday, and Friday Home Health Nurse may d/c VAC for s/s  of increased infection, significant wound regression, or uncontrolled drainage. [place] [phone] Number of foam/gauze pieces used in the dressing = - 1 piece of black foam place into the wound bed. This needs to be bridged to the side of the abdomen/hip area so that the patient will not be sitting on the suction pad and tubing when up in his chair. This will also help the suction pad to stay in place. WOUND #3: - Ischial Tuberosity Wound Laterality: Right Cleanser: [place] Ancillary Kit - 15 Day Supply (Generic) 1 x Per Day/30 Days Discharge Instructions: Use supplies as instructed; Kit contains: (15) Saline Bullets; (15) 3x3 Gauze; 15 pr Gloves Cleanser: Normal Saline 1 x Per Day/30 Days Discharge Instructions: Wash your hands with soap and water. Remove old dressing, discard into plastic bag and place into trash. Cleanse the wound with Normal Saline prior to applying a clean dressing using gauze sponges, not tissues or cotton balls. Do not scrub or use excessive force. Pat dry using gauze sponges, not tissue or cotton balls. Cleanser: Wound Cleanser 1 x Per Day/30 Days Discharge Instructions: Wash your hands with soap and water. Remove old dressing, discard into plastic bag and place into trash. Cleanse the wound with Wound Cleanser prior to applying a clean dressing using gauze sponges, not tissues or cotton balls. Do not scrub or use excessive force. Pat dry using gauze sponges, not tissue or cotton balls. Primary Dressing: Gauze (Generic) 1 x Per Day/30 Days Discharge Instructions: saline Wet to Dry applied in the office until the wound VAC is replaced by home health Secondary Dressing: ABD Pad 5x9 (in/in) (Generic) 1 x Per Day/30 Days Discharge Instructions: Cover with ABD pad Secured With: Medipore Tape - 21M Medipore H Soft Cloth Surgical Tape, 2x2 (in/yd) (Generic) 1 x Per Day/30 Days WOUND #4: - Ischial Tuberosity Wound Laterality: Left Cleanser: Normal  Saline 3 x Per Week/30 Days Discharge Instructions: Wash your hands with soap and water. Remove old dressing, discard into plastic bag and place into trash. Cleanse the wound with Normal Saline prior to applying a clean dressing using gauze sponges, not tissues or cotton balls. Do not scrub or use excessive force. Pat dry using gauze sponges, not tissue or cotton balls. Cleanser: Wound Cleanser 3 x Per Week/30 Days Discharge Instructions: Wash your hands with soap and water. Remove old dressing, discard into plastic bag and place into trash. Cleanse the wound with Wound Cleanser prior to applying a clean dressing using  gauze sponges, not tissues or cotton balls. Do not scrub or use excessive force. Pat dry using gauze sponges, not tissue or cotton balls. Primary Dressing: Hydrofera Blue Ready Transfer Foam, 2.5x2.5 (in/in) 3 x Per Week/30 Days Discharge Instructions: Apply Hydrofera Blue Ready to wound bed as directed Secondary Dressing: (SILCONE BORDER) Zetuvit Plus SILICONE BORDER Dressing 5x5 (in/in) 3 x Per Week/30 Days Discharge Instructions: Please do not put silicone bordered dressings under wraps. Use non-bordered dressing only. 1. I do believe that the patient needs to have appropriate wound VAC dressings put in place so that it does not be that should not be something that is happening even when the home health nurses there. The patient states that is been the case. For that reason either the machine is not working or this is not being applied properly. 2. I am would recommend as well continued and appropriate and aggressive offloading to keep off of the gluteal area this should allow for these areas to heal much more effectively tells me he spends most of his time in the bed trying to do this. We will see patient back for reevaluation in 2 weeks here in the clinic. If anything worsens or changes patient will contact our office for additional recommendations. Electronic  Signature(s) Signed: 02/19/2022 10:11:11 AM By: Worthy Keeler PA-C Entered By: Worthy Keeler on 02/19/2022 10:11:11 [name] ([id number]) -------------------------------------------------------------------------------- SuperBill Details Patient Name: [name] Date of Service: 02/19/2022 Medical Record Number: [id number] Patient Account Number: [id number] Date of Birth/Sex: May 25, 1966 (55 y.o. M) Treating RN: Cornell Barman Primary Care Provider: Fulton Reek Other Clinician: Referring Provider: Fulton Reek Treating Provider/Extender: Skipper Cliche in Treatment: 5 Diagnosis Coding ICD-10 Codes Code Description L89.314 Pressure ulcer of right buttock, stage 4 L89.323 Pressure ulcer of left buttock, stage 3 Q76.0 Spina bifida occulta Z93.3 Colostomy status I10 Essential (primary) hypertension I25.10 Atherosclerotic heart disease of native coronary artery without angina pectoris Facility Procedures CPT4 Code: [id number] Description: 99213 - WOUND CARE VISIT-LEV 3 EST PT Modifier: Quantity: 1 Physician Procedures CPT4 Code: [id number] Description: 99214 - WC PHYS LEVEL 4 - EST PT Modifier: Quantity: 1 CPT4 Code: Description: ICD-10 Diagnosis Description L89.314 Pressure ulcer of right buttock, stage 4 L89.323 Pressure ulcer of left buttock, stage 3 Q76.0 Spina bifida occulta Z93.3 Colostomy status Modifier: Quantity: Electronic Signature(s) Signed: 02/19/2022 10:11:32 AM By: Worthy Keeler PA-C Entered By: Worthy Keeler on 02/19/2022 10:11:31

## 2022-03-05 ENCOUNTER — Encounter: Payer: Medicare Other | Attending: Physician Assistant | Admitting: Physician Assistant

## 2022-03-05 ENCOUNTER — Other Ambulatory Visit
Admission: RE | Admit: 2022-03-05 | Discharge: 2022-03-05 | Disposition: A | Discharge status: Home / Self Care | Payer: Medicare Other | Source: Ambulatory Visit | Attending: Physician Assistant | Admitting: Physician Assistant

## 2022-03-05 DIAGNOSIS — I2510 Atherosclerotic heart disease of native coronary artery without angina pectoris: Secondary | ICD-10-CM | POA: Insufficient documentation

## 2022-03-05 DIAGNOSIS — N319 Neuromuscular dysfunction of bladder, unspecified: Secondary | ICD-10-CM | POA: Insufficient documentation

## 2022-03-05 DIAGNOSIS — B965 Pseudomonas (aeruginosa) (mallei) (pseudomallei) as the cause of diseases classified elsewhere: Secondary | ICD-10-CM | POA: Diagnosis present

## 2022-03-05 DIAGNOSIS — I129 Hypertensive chronic kidney disease with stage 1 through stage 4 chronic kidney disease, or unspecified chronic kidney disease: Secondary | ICD-10-CM | POA: Insufficient documentation

## 2022-03-05 DIAGNOSIS — L89323 Pressure ulcer of left buttock, stage 3: Secondary | ICD-10-CM | POA: Diagnosis not present

## 2022-03-05 DIAGNOSIS — Z982 Presence of cerebrospinal fluid drainage device: Secondary | ICD-10-CM | POA: Diagnosis not present

## 2022-03-05 DIAGNOSIS — Q760 Spina bifida occulta: Secondary | ICD-10-CM | POA: Diagnosis not present

## 2022-03-05 DIAGNOSIS — G8220 Paraplegia, unspecified: Secondary | ICD-10-CM | POA: Insufficient documentation

## 2022-03-05 DIAGNOSIS — M868X8 Other osteomyelitis, other site: Secondary | ICD-10-CM | POA: Diagnosis not present

## 2022-03-05 DIAGNOSIS — Z933 Colostomy status: Secondary | ICD-10-CM | POA: Insufficient documentation

## 2022-03-05 DIAGNOSIS — L89314 Pressure ulcer of right buttock, stage 4: Secondary | ICD-10-CM | POA: Insufficient documentation

## 2022-03-05 DIAGNOSIS — N183 Chronic kidney disease, stage 3 unspecified: Secondary | ICD-10-CM | POA: Diagnosis not present

## 2022-03-05 NOTE — Progress Notes (Signed)
ZYSHAWN, BOHNENKAMP ([id number]) Visit Report for 03/05/2022 Arrival Information Details Patient Name: Chase, [name] Date of Service: 03/05/2022 11:30 AM Medical Record Number: [id number] Patient Account Number: [id number] Date of Birth/Sex: [birth year] (56 y.o. M) Treating RN: Levora Dredge Primary Care Kannan Proia: Fulton Reek Other Clinician: Referring Olinda Nola: Fulton Reek Treating Odessa Morren/Extender: Skipper Cliche in Treatment: 7 Visit Information History Since Last Visit Added or deleted any medications: No Patient Arrived: Wheel Chair Any new allergies or adverse reactions: No Arrival Time: 11:30 Had a fall or experienced change in No Accompanied By: self activities of daily living that may affect Transfer Assistance: Transfer Board risk of falls: Patient Identification Verified: Yes Hospitalized since last visit: No Secondary Verification Process Completed: Yes Has Dressing in Place as Prescribed: Yes Patient Requires Transmission-Based Precautions: No Pain Present Now: No Patient Has Alerts: Yes Patient Alerts: NOT diabetic Electronic Signature(s) Signed: 03/05/2022 4:40:28 PM By: Levora Dredge Entered By: Levora Dredge on 03/05/2022 11:34:10 [name] ([id number]) -------------------------------------------------------------------------------- Clinic Level of Care Assessment Details Patient Name: [name] Date of Service: 03/05/2022 11:30 AM Medical Record Number: [id number] Patient Account Number: [id number] Date of Birth/Sex: 25-Jul-1966 (55 y.o. M) Treating RN: Levora Dredge Primary Care Trafton Roker: Fulton Reek Other Clinician: Referring Nuno Brubacher: Fulton Reek Treating Violia Knopf/Extender: Skipper Cliche in Treatment: 7 Clinic Level of Care Assessment Items TOOL 4 Quantity Score _0  - Use when only an EandM is performed on FOLLOW-UP visit 0 ASSESSMENTS - Nursing Assessment / Reassessment X - Reassessment of Co-morbidities  (includes updates in patient status) 1 10 _1  - 0 Reassessment of Adherence to Treatment Plan ASSESSMENTS - Wound and Skin Assessment / Reassessment _2  - Simple Wound Assessment / Reassessment - one wound 0 X- 2 5 Complex Wound Assessment / Reassessment - multiple wounds _3  - 0 Dermatologic / Skin Assessment (not related to wound area) ASSESSMENTS - Focused Assessment _4  - Circumferential Edema Measurements - multi extremities 0 _5  - 0 Nutritional Assessment / Counseling / Intervention _6  - 0 Lower Extremity Assessment (monofilament, tuning fork, pulses) _7  - 0 Peripheral Arterial Disease Assessment (using hand held doppler) ASSESSMENTS - Ostomy and/or Continence Assessment and Care _8  - Incontinence Assessment and Management 0 _9  - 0 Ostomy Care Assessment and Management (repouching, etc.) PROCESS - Coordination of Care X - Simple Patient / Family Education for ongoing care 1 15 _10  - 0 Complex (extensive) Patient / Family Education for ongoing care _11  - 0 Staff obtains Programmer, systems, Records, Test Results / Process Orders _12  - 0 Staff telephones HHA, Nursing Homes / Clarify orders / etc _13  - 0 Routine Transfer to another Facility (non-emergent condition) _14  - 0 Routine Hospital Admission (non-emergent condition) _15  - 0 New Admissions / Biomedical engineer / Ordering NPWT, Apligraf, etc. _16  - 0 Emergency Hospital Admission (emergent condition) X- 1 10 Simple Discharge Coordination _17  - 0 Complex (extensive) Discharge Coordination PROCESS - Special Needs _18  - Pediatric / Minor Patient Management 0 _19  - 0 Isolation Patient Management _20  - 0 Hearing / Language / Visual special needs _21  - 0 Assessment of Community assistance (transportation, D/C planning, etc.) _22  - 0 Additional assistance / Altered mentation _23  - 0 Support Surface(s) Assessment (bed, cushion, seat, etc.) INTERVENTIONS - Wound Cleansing / Measurement [place], Saabir ([id number]) _24  - 0 Simple Wound  Cleansing - one wound X- 2 5 Complex Wound Cleansing - multiple wounds X- 1 5 Wound Imaging (photographs - any number of wounds) _25  - 0 Wound Tracing (instead of photographs) _26  - 0 Simple Wound Measurement - one  wound X- 2 5 Complex Wound Measurement - multiple wounds INTERVENTIONS - Wound Dressings _0  - Small Wound Dressing one or multiple wounds 0 X- 2 15 Medium Wound Dressing one or multiple wounds _1  - 0 Large Wound Dressing one or multiple wounds <JJOACZYSAYTKZSWF>_0<\/XNATFTDDUKGURKYH>_0  - 0 Application of Medications - topical <WCBJSEGBTDVVOHYW>_7<\/PXTGGYIRSWNIOEVO>_3  - 0 Application of Medications - injection INTERVENTIONS - Miscellaneous _4  - External ear exam 0 _5  - 0 Specimen Collection (cultures, biopsies, blood, body fluids, etc.) _6  - 0 Specimen(s) / Culture(s) sent or taken to Lab for analysis X- 1 10 Patient Transfer (multiple staff / Civil Service fast streamer / Similar devices) _7  - 0 Simple Staple / Suture removal (25 or less) _8  - 0 Complex Staple / Suture removal (26 or more) _9  - 0 Hypo / Hyperglycemic Management (close monitor of Blood Glucose) _10  - 0 Ankle / Brachial Index (ABI) - do not check if billed separately X- 1 5 Vital Signs Has the patient been seen at the hospital within the last three years: Yes Total Score: 115 Level Of Care: New/Established - Level 3 Electronic Signature(s) Signed: 03/05/2022 4:40:28 PM By: Levora Dredge Entered By: Levora Dredge on 03/05/2022 14:56:45 [name] ([id number]) -------------------------------------------------------------------------------- Encounter Discharge Information Details Patient Name: [name] Date of Service: 03/05/2022 11:30 AM Medical Record Number: [id number] Patient Account Number: [id number] Date of Birth/Sex: [birth year] (55 y.o. M) Treating RN: Levora Dredge Primary Care Jaki Hammerschmidt: Fulton Reek Other Clinician: Referring Kelleen Stolze: Fulton Reek Treating Alyshia Kernan/Extender: Skipper Cliche in Treatment: 7 Encounter Discharge Information  Items Discharge Condition: Stable Ambulatory Status: Wheelchair Discharge Destination: Home Transportation: Other Accompanied By: self / transport Schedule Follow-up Appointment: Yes Clinical Summary of Care: Electronic Signature(s) Signed: 03/05/2022 3:24:19 PM By: Levora Dredge Entered By: Levora Dredge on 03/05/2022 15:24:19 [name] ([id number]) -------------------------------------------------------------------------------- Lower Extremity Assessment Details Patient Name: [name] Date of Service: 03/05/2022 11:30 AM Medical Record Number: [id number] Patient Account Number: [id number] Date of Birth/Sex: [birth year] (55 y.o. M) Treating RN: Levora Dredge Primary Care Rielly Brunn: Fulton Reek Other Clinician: Referring Gilda Abboud: Fulton Reek Treating Anayeli Arel/Extender: Skipper Cliche in Treatment: 7 Electronic Signature(s) Signed: 03/05/2022 4:40:28 PM By: Levora Dredge Entered By: Levora Dredge on 03/05/2022 11:52:02 [name] ([id number]) -------------------------------------------------------------------------------- Multi Wound Chart Details Patient Name: [name] Date of Service: 03/05/2022 11:30 AM Medical Record Number: [id number] Patient Account Number: [id number] Date of Birth/Sex: [birth year] (55 y.o. M) Treating RN: Levora Dredge Primary Care Angelena Sand: Fulton Reek Other Clinician: Referring Esbeidy Mclaine: Fulton Reek Treating Fardeen Steinberger/Extender: Skipper Cliche in Treatment: 7 Vital Signs Height(in): 60 Pulse(bpm): 58 Weight(lbs): Blood Pressure(mmHg): 104/79 Body Mass Index(BMI): Temperature(F): 98.2 Respiratory Rate(breaths/min): 18 Photos: [N/A:N/A] Wound Location: Right Ischial Tuberosity Left Ischial Tuberosity N/A Wounding Event: Gradually Appeared Gradually Appeared N/A Primary Etiology: Pressure Ulcer Pressure Ulcer N/A Comorbid History: Cataracts, Glaucoma, Coronary Cataracts, Glaucoma, Coronary  N/A Artery Disease, Hypertension, Artery Disease, Hypertension, History of pressure wounds, History of pressure wounds, Osteoarthritis, Paraplegia Osteoarthritis, Paraplegia Date Acquired: 09/03/2021 01/29/2022 N/A Weeks of Treatment: 7 4 N/A Wound Status: Open Open N/A Wound Recurrence: No No N/A Measurements L x W x D (cm) 2.5x7.3x1.5 2x1x0.6 N/A Area (cm) : 14.334 1.571 N/A Volume (cm) : 21.5 0.942 N/A % Reduction in Area: 18.50% -33.40% N/A % Reduction in Volume: 51.10% 71.40% N/A Starting Position 1 (o'clock): 6 Ending Position 1 (o'clock): 11 Maximum Distance 1 (cm): 0.8 Undermining: No Yes N/A Classification: Category/Stage IV Category/Stage IV N/A Exudate Amount: Large Medium N/A Exudate Type: Serosanguineous Serosanguineous N/A Exudate Color: red, brown red, brown N/A Granulation  Amount: Medium (34-66%) Large (67-100%) N/A Granulation Quality: Red, Pink Red N/A Necrotic Amount: Medium (34-66%) Small (1-33%) N/A Necrotic Tissue: Eschar, Adherent [place] N/A Exposed Structures: Fat Layer (Subcutaneous Tissue): Fat Layer (Subcutaneous Tissue): N/A Yes Yes Epithelialization: None Small (1-33%) N/A Treatment Notes Electronic Signature(s) Signed: 03/05/2022 2:55:09 PM By: Levora Dredge Entered By: Levora Dredge on 03/05/2022 14:55:09 [name] ([id number]) -------------------------------------------------------------------------------- [place] Details Patient Name: [name] Date of Service: 03/05/2022 11:30 AM Medical Record Number: [id number] Patient Account Number: [id number] Date of Birth/Sex: 1966-10-02 (55 y.o. M) Treating RN: Levora Dredge Primary Care Rumaisa Schnetzer: Fulton Reek Other Clinician: Referring Dorna Mallet: Fulton Reek Treating Yashika Mask/Extender: Skipper Cliche in Treatment: 7 Active Inactive Pressure Nursing Diagnoses: Knowledge deficit related to causes and risk factors for pressure ulcer  development Knowledge deficit related to management of pressures ulcers Potential for impaired tissue integrity related to pressure, friction, moisture, and shear Goals: Patient will remain free from development of additional pressure ulcers Date Initiated: 01/15/2022 Target Resolution Date: 02/26/2022 Goal Status: Active Patient/caregiver will verbalize risk factors for pressure ulcer development Date Initiated: 01/15/2022 Target Resolution Date: 03/05/2022 Goal Status: Active Interventions: Assess: immobility, friction, shearing, incontinence upon admission and as needed Assess offloading mechanisms upon admission and as needed Assess potential for pressure ulcer upon admission and as needed Notes: Electronic Signature(s) Signed: 03/05/2022 2:54:56 PM By: Levora Dredge Entered By: Levora Dredge on 03/05/2022 14:54:56 [name] ([id number]) -------------------------------------------------------------------------------- Pain Assessment Details Patient Name: [name] Date of Service: 03/05/2022 11:30 AM Medical Record Number: [id number] Patient Account Number: [id number] Date of Birth/Sex: 1965/11/21 (55 y.o. M) Treating RN: Levora Dredge Primary Care Chaney Maclaren: Fulton Reek Other Clinician: Referring Johnathon Olden: Fulton Reek Treating Joellen Tullos/Extender: Skipper Cliche in Treatment: 7 Active Problems Location of Pain Severity and Description of Pain Patient Has Paino No Site Locations Rate the pain. Current Pain Level: 0 Pain Management and Medication Current Pain Management: Electronic Signature(s) Signed: 03/05/2022 4:40:28 PM By: Levora Dredge Entered By: Levora Dredge on 03/05/2022 11:37:10 [name] ([id number]) -------------------------------------------------------------------------------- Patient/Caregiver Education Details Patient Name: [name] Date of Service: 03/05/2022 11:30 AM Medical Record Number: [id number] Patient  Account Number: [id number] Date of Birth/Gender: 18-Jun-1966 (55 y.o. M) Treating RN: Levora Dredge Primary Care Physician: Fulton Reek Other Clinician: Referring Physician: Fulton Reek Treating Physician/Extender: Skipper Cliche in Treatment: 7 Education Assessment Education Provided To: Patient and Caregiver Education Topics Provided Wound/Skin Impairment: Handouts: Caring for Your Ulcer, Other: orders faxed to [facility] Methods: Explain/Verbal Responses: State content correctly Electronic Signature(s) Signed: 03/05/2022 4:40:28 PM By: Levora Dredge Entered By: Levora Dredge on 03/05/2022 15:23:39 [name] ([id number]) -------------------------------------------------------------------------------- Wound Assessment Details Patient Name: [name] Date of Service: 03/05/2022 11:30 AM Medical Record Number: [id number] Patient Account Number: [id number] Date of Birth/Sex: [birth year] (55 y.o. M) Treating RN: Levora Dredge Primary Care Jaasia Viglione: Fulton Reek Other Clinician: Referring Shalin Vonbargen: Fulton Reek Treating Chanler Mendonca/Extender: Skipper Cliche in Treatment: 7 Wound Status Wound Number: 3 Primary Pressure Ulcer Etiology: Wound Location: Right Ischial Tuberosity Wound Open Wounding Event: Gradually Appeared Status: Date Acquired: 09/03/2021 Comorbid Cataracts, Glaucoma, Coronary Artery Disease, Weeks Of Treatment: 7 History: Hypertension, History of pressure wounds, Osteoarthritis, Clustered Wound: No Paraplegia Photos Wound Measurements Length: (cm) 2.5 Width: (cm) 7.3 Depth: (cm) 1.5 Area: (cm) 14.334 Volume: (cm) 21.5 % Reduction in Area: 18.5% % Reduction in Volume: 51.1% Epithelialization: None Tunneling: No Undermining: No Wound Description Classification: Category/Stage IV Exudate Amount: Large Exudate Type: Serosanguineous Exudate Color: red, brown Wound Bed Granulation Amount: Medium (34-66%)  Exposed  Structure Granulation Quality: Red, Pink Fat Layer (Subcutaneous Tissue) Exposed: Yes Necrotic Amount: Medium (34-66%) Necrotic Quality: Eschar, Adherent Slough Treatment Notes Wound #3 (Ischial Tuberosity) Wound Laterality: Right Cleanser [place] Ancillary Kit - 15 Day Supply Discharge Instruction: Use supplies as instructed; Kit contains: (15) Saline Bullets; (15) 3x3 Gauze; 15 pr Gloves Normal Saline Discharge Instruction: Wash your hands with soap and water. Remove old dressing, discard into plastic bag and place into trash. Cleanse the wound with Normal Saline prior to applying a clean dressing using gauze sponges, not tissues or cotton balls. Do not scrub or use excessive force. Pat dry using gauze sponges, not tissue or cotton balls. Wound Cleanser QUSAI, KEM ([id number]) Discharge Instruction: Wash your hands with soap and water. Remove old dressing, discard into plastic bag and place into trash. Cleanse the wound with Wound Cleanser prior to applying a clean dressing using gauze sponges, not tissues or cotton balls. Do not scrub or use excessive force. Pat dry using gauze sponges, not tissue or cotton balls. Peri-Wound Care Topical Primary Dressing Gauze Discharge Instruction: saline Wet to Dry applied in the office until the wound VAC is replaced by home health Secondary Dressing ABD Pad 5x9 (in/in) Discharge Instruction: Cover with ABD pad Secured With [place] Soft Cloth Surgical Tape, 2x2 (in/yd) Compression Wrap Compression Stockings Add-Ons Electronic Signature(s) Signed: 03/05/2022 4:40:28 PM By: Levora Dredge Entered By: Levora Dredge on 03/05/2022 11:50:50 [name] ([id number]) -------------------------------------------------------------------------------- Wound Assessment Details Patient Name: [name] Date of Service: 03/05/2022 11:30 AM Medical Record Number: [id number] Patient Account Number: [id number] Date of  Birth/Sex: Sep 24, 1966 (55 y.o. M) Treating RN: Levora Dredge Primary Care Monic Engelmann: Fulton Reek Other Clinician: Referring Domnique Vantine: Fulton Reek Treating Tesneem Dufrane/Extender: Skipper Cliche in Treatment: 7 Wound Status Wound Number: 4 Primary Pressure Ulcer Etiology: Wound Location: Left Ischial Tuberosity Wound Open Wounding Event: Gradually Appeared Status: Date Acquired: 01/29/2022 Comorbid Cataracts, Glaucoma, Coronary Artery Disease, Weeks Of Treatment: 4 History: Hypertension, History of pressure wounds, Osteoarthritis, Clustered Wound: No Paraplegia Photos Wound Measurements Length: (cm) 2 Width: (cm) 1 Depth: (cm) 0.6 Area: (cm) 1.571 Volume: (cm) 0.942 % Reduction in Area: -33.4% % Reduction in Volume: 71.4% Epithelialization: Small (1-33%) Tunneling: No Undermining: Yes Starting Position (o'clock): 6 Ending Position (o'clock): 11 Maximum Distance: (cm) 0.8 Wound Description Classification: Category/Stage IV Exudate Amount: Medium Exudate Type: Serosanguineous Exudate Color: red, brown Foul Odor After Cleansing: No Slough/Fibrino Yes Wound Bed Granulation Amount: Large (67-100%) Exposed Structure Granulation Quality: Red Fat Layer (Subcutaneous Tissue) Exposed: Yes Necrotic Amount: Small (1-33%) Necrotic Quality: Adherent Slough Treatment Notes Wound #4 (Ischial Tuberosity) Wound Laterality: Left Cleanser Normal Saline Discharge Instruction: Wash your hands with soap and water. Remove old dressing, discard into plastic bag and place into trash. Cleanse the wound with Normal Saline prior to applying a clean dressing using gauze sponges, not tissues or cotton balls. Do not Chisholm, [name] ([id number]) scrub or use excessive force. Pat dry using gauze sponges, not tissue or cotton balls. Wound Cleanser Discharge Instruction: Wash your hands with soap and water. Remove old dressing, discard into plastic bag and place into trash. Cleanse the  wound with Wound Cleanser prior to applying a clean dressing using gauze sponges, not tissues or cotton balls. Do not scrub or use excessive force. Pat dry using gauze sponges, not tissue or cotton balls. Peri-Wound Care Topical Primary Dressing Hydrofera Blue Ready Transfer Foam, 2.5x2.5 (in/in) Discharge Instruction: Apply Hydrofera Blue Ready to wound bed as directed Secondary  Dressing (SILCONE BORDER) Zetuvit Plus SILICONE BORDER Dressing 5x5 (in/in) Discharge Instruction: Please do not put silicone bordered dressings under wraps. Use non-bordered dressing only. Secured With Compression Wrap Compression Stockings Add-Ons Electronic Signature(s) Signed: 03/05/2022 4:40:28 PM By: Levora Dredge Entered By: Levora Dredge on 03/05/2022 11:51:52 [name] ([id number]) -------------------------------------------------------------------------------- Vitals Details Patient Name: [name] Date of Service: 03/05/2022 11:30 AM Medical Record Number: [id number] Patient Account Number: [id number] Date of Birth/Sex: 09/13/1966 (55 y.o. M) Treating RN: Levora Dredge Primary Care Jerman Tinnon: Fulton Reek Other Clinician: Referring Kemani Demarais: Fulton Reek Treating Jeraldean Wechter/Extender: Skipper Cliche in Treatment: 7 Vital Signs Time Taken: 11:34 Temperature (F): 98.2 Height (in): 60 Pulse (bpm): 89 Respiratory Rate (breaths/min): 18 Blood Pressure (mmHg): 104/79 Reference Range: 80 - 120 mg / dl Electronic Signature(s) Signed: 03/05/2022 4:40:28 PM By: Levora Dredge Entered By: Levora Dredge on 03/05/2022 11:37:03

## 2022-03-05 NOTE — Progress Notes (Addendum)
SABAN, HEINLEN ([id number]) Visit Report for 03/05/2022 Chief Complaint Document Details Patient Name: [name], [name] Date of Service: 03/05/2022 11:30 AM Medical Record Number: [id number] Patient Account Number: [id number] Date of Birth/Sex: 1966-04-18 (56 y.o. M) Treating RN: Levora Dredge Primary Care Provider: Fulton Reek Other Clinician: Referring Provider: Fulton Reek Treating Provider/Extender: Skipper Cliche in Treatment: 7 Information Obtained from: Patient Chief Complaint Right ischial tuberosity pressure ulcer Electronic Signature(s) Signed: 03/05/2022 11:35:11 AM By: Worthy Keeler PA-C Entered By: Worthy Keeler on 03/05/2022 11:35:11 [name] ([id number]) -------------------------------------------------------------------------------- HPI Details Patient Name: [name] Date of Service: 03/05/2022 11:30 AM Medical Record Number: [id number] Patient Account Number: [id number] Date of Birth/Sex: 1966-10-02 (55 y.o. M) Treating RN: Levora Dredge Primary Care Provider: Fulton Reek Other Clinician: Referring Provider: Fulton Reek Treating Provider/Extender: Skipper Cliche in Treatment: 7 History of Present Illness HPI Description: 56 year old male with a history of spina bifida presenting to [place] with a history of a open wound on the left gluteal region near his upper thigh which she's had for several weeks. He was seen in the ED recently at [facility] and was put on doxycycline and was advised some local care. his past medical history is significant for spinal bifid, neurogenic bladder, kidney stones, sacral pressure sores, constipation. Past surgical history significant for partial cystectomy, ileal conduit, VP shunt removal, percutaneous nephro lithotripsy. In the remote past the patient says he's had some treatment for a ulcer on this area and was treated with a skin graft. Readmission: 10/16/2021 upon evaluation today  patient appears to be doing somewhat poorly in regard to a fairly large wound noted over the right ischial tuberosity location. This is a stage IV pressure ulcer. Of note this is also positive for osteomyelitis upon a CT scan which was performed during the time that he was admitted in the hospital on 09/19/2021. With that being said it is noted in the right inferior buttock that he has a decubitus ulcer which extends to the posterior toe inferior right ischium where there is evidence of osteomyelitis. When he was here in the office actually missed that this was positive I missed read it and thought it said that there was no osteomyelitis. That is not the case there is in fact osteomyelitis noted here. I actually did review his notes as well in epic. Looking to the discharge summary it appears that the patient was admitted from 09/19/2021 through 09/23/2021. He was discharged home with home health care according to the note. With that being said from what I understand his sister actually takes care of him at this point. He was also to follow-up with a primary care provider and here at the wound care center within a week. I am just now seeing him on the 13th almost a month after discharge. He does have a history of again osteomyelitis of this right hip/ischial tuberosity location. 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 The reason for his admission was actually worsening of the decubitus ulcer upon admission. He does have chronic osteomyelitis of this area. He was treated with empiric antibiotics he had acute kidney injury which improved with IV fluids he also had hypokalemia. Surgical debridement was performed by general surgery at that time and ID was consulted to assist with management according to the notes. IV antibiotics were recommended  but  patient declined and wanted oral antibiotics at discharge. Therefore no IV antibiotics were planned for discharge time. This case was under the care of Dr. Steva Ready while he was in the hospital when she did discharge him with a 2-week course of doxycycline and Augmentin according to the notes. It looks like Dr. Doy Hutching office did call and leave a message for the patient to get scheduled for a follow-up visit after hospital discharge I do not see where he showed up in fact it appears that the patient has a no- show letter from Dr. Doy Hutching office noted in epic due to a appointment that was missed on 10/06/2021. Looking at the pictures from Dr. Raelene Bott notes on 09/23/2021 it does appear that the wound is a little bit better as far as the cleanliness of the surface of the wound today but nonetheless still is a very significant wound. 10/26/2021 upon evaluation today patient appears to be doing okay in regard to his wound. Again this is a wound that he did indeed have osteomyelitis and previous. With that being said I do not see any signs of a worsening infection at this time which is good news. Overall I think that we are headed in the right direction although still I think he needs more aggressive offloading. 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 I do believe this would be ideal for him to be honest. Readmission: 01-15-2022 upon evaluation today patient presents for reevaluation he was last seen on October 26, 2021. At that time unfortunately he was having issues with a significant ischial pressure ulcer on the right ischial tuberosity. Subsequently he ended up having decent response to the Dakin's moistened gauze dressing that was previously being utilized and subsequently the patient's sister who is his primary caregiver as well states that she decided just to try to manage this at home. Nonetheless unfortunately this is getting a little larger not better. He does spend a  lot of the day in his chair. He does have an air mattress according what they tell me today. That was previously ordered. With that being said I do believe that at this time things do appear to be doing much better from the standpoint of infection I do not see any signs of overt infection. There is also no evidence of systemic infection. No fevers, chills, nausea, vomiting, or diarrhea. With that being said I do believe based on what I am seeing the wound is a bit cleaner and he possibly could be an excellent candidate for a wound VAC. 02-05-2022 upon evaluation today patient appears to be doing about the same in regard to his wound. Unfortunately he is not any better but he also really has not had the wound VAC on sufficiently. There is been some confusion with the orders part of that I think is our follow-up with the way the order was written part of it may be with how the wound VAC is being placed either way we need to see what we can do to try to improve things in this regard. We will clarify the orders today going forward for home health. Unfortunately the patient does have a new wound today which is on the opposite side. This is not good 1 was bad enough have another area to open is definitely not a good thing. Its not as deep but nonetheless is also not very shallow either. We may potentially be able to get this to the point that  we could bridge the 2 together in order to wound VAC both but right now want a focus on to try to get the wound VAC going for the main wound initially we will likely use Hydrofera Blue on the new wound.Marland Kitchen 02-19-2022 upon evaluation today patient's wound appears to be doing a little bit worse in the way of maceration fortunately there is no signs of active infection locally or systemically which is great news. No fevers, chills, nausea, vomiting, or diarrhea. 03-05-2022 upon evaluation today patient unfortunately does not appear to be doing nearly as good in regard to his  wounds as I would like to see. Fortunately I do not see any evidence of active infection that is obvious although I am can obtain a wound culture to ensure that there is not anything getting worse here as far as the wound without the wound VAC on his concern. With that being said I will go ahead and get this sent in and evaluated will initiate treatment as needed going forward if necessary. With regard to the wound with the wound VAC this continues to be [place], [name] ([id number]) placed directly over the wound and there is obvious signs of deep tissue injury. The suction being here means that he is sitting on it I think this may be part of the reason why it is coming off although not 902% certain. Electronic Signature(s) Signed: 03/05/2022 1:21:43 PM By: Worthy Keeler PA-C Entered By: Worthy Keeler on 03/05/2022 13:21:43 [name] ([id number]) -------------------------------------------------------------------------------- Physical Exam Details Patient Name: [name] Date of Service: 03/05/2022 11:30 AM Medical Record Number: [id number] Patient Account Number: [id number] Date of Birth/Sex: Nov 16, 1965 (55 y.o. M) Treating RN: Levora Dredge Primary Care Provider: Fulton Reek Other Clinician: Referring Provider: Fulton Reek Treating Provider/Extender: Skipper Cliche in Treatment: 7 Constitutional Chronically ill appearing but in no apparent acute distress. Respiratory normal breathing without difficulty. Psychiatric this patient is able to make decisions and demonstrates good insight into disease process. Alert and Oriented x 3. pleasant and cooperative. Notes Upon inspection patient's wound bed actually showed signs of again deep tissue injury in the main wound which was original in the largest I do believe this could be related to the wound VAC to a degree be in place with suction directly over this area as opposed to bridging this which was in the orders  from last time. I did actually contact the home health company, [employer], and the clinical manager is going to have an RN go out and ensure that the wound VAC is being applied appropriately.With that being said in the meantime for the other wound I did obtain a wound culture we will get a continue with the [facility] although I want to make sure that there is no signs of active infection. Electronic Signature(s) Signed: 03/05/2022 1:22:27 PM By: Worthy Keeler PA-C Entered By: Worthy Keeler on 03/05/2022 13:22:27 [name] ([id number]) -------------------------------------------------------------------------------- Physician Orders Details Patient Name: [name] Date of Service: 03/05/2022 11:30 AM Medical Record Number: [id number] Patient Account Number: [id number] Date of Birth/Sex: April 02, 1966 (55 y.o. M) Treating RN: Levora Dredge Primary Care Provider: Fulton Reek Other Clinician: Referring Provider: Fulton Reek Treating Provider/Extender: Skipper Cliche in Treatment: 7 Verbal / Phone Orders: No Diagnosis Coding ICD-10 Coding Code Description L89.314 Pressure ulcer of right buttock, stage 4 L89.323 Pressure ulcer of left buttock, stage 3 Q76.0 Spina bifida occulta Z93.3 Colostomy status I10 Essential (primary) hypertension I25.10 Atherosclerotic heart disease of native coronary artery without  angina pectoris Follow-up Appointments o Return Appointment in 2 weeks. o Nurse Visit as needed [place]: - [facility] o ADMIT to [facility] for wound care. May utilize formulary equivalent dressing for wound treatment orders unless otherwise specified. Home Health Nurse may visit PRN to address patientos wound care needs. o Scheduled days for dressing changes to be completed; exception, patient has scheduled wound care visit that day. o **Please direct any NON-WOUND related issues/requests for orders to patient's Primary  Care Physician. **If current dressing causes regression in wound condition, may D/C ordered dressing product/s and apply Normal Saline Moist Dressing daily until next [facility] or Other MD appointment. **Notify [facility] of regression in wound condition at [phone]. Bathing/ Shower/ Hygiene o Clean wound with Normal Saline or wound cleanser. - keep dressing dry or change after shower o No tub bath. Off-Loading o Gel wheelchair cushion o Hospital bed/mattress - Has at home o Air fluidized (Group 3) - Has from [employer] and reposition every 2 hours - Do not sit in wheelchair any longer than you have to--offload wound in bed as much as possible Additional Orders / Instructions o Follow Nutritious Diet and Increase Protein Intake o Other: - Refer to ostomy clinic in [facility] if desired Negative Pressure Wound Therapy Wound #3 Right Ischial Tuberosity o Wound VAC settings at 159mHg continuous pressure. Use foam to wound cavity. Please order WHITE foam to fill any tunnel/s and/or undermining when necessary. Change VAC dressing 3 X WEEK. Change canister as indicated when full. - Change Monday, Wednesday, and Friday o Home Health Nurse may d/c VAC for s/s of increased infection, significant wound regression, or uncontrolled drainage. NDavisat [phone] o Number of foam/gauze pieces used in the dressing = - 1 piece of black foam place into the wound bed. This needs to be bridged to the side of the abdomen/hip area so that the patient will not be sitting on the suction pad and tubing when up in his chair. This will also help the suction pad to stay in place. Wound Treatment Wound #3 - Ischial Tuberosity Wound Laterality: Right Cleanser: Byram Ancillary Kit - 15 Day Supply (Generic) 1 x Per Day/30 Days Discharge Instructions: Use supplies as instructed; Kit contains: (15) Saline Bullets; (15) 3x3 Gauze; 15 pr  Gloves Cleanser: Normal Saline 1 x Per Day/30 Days FTABITHA, TUPPER([id number] Discharge Instructions: Wash your hands with soap and water. Remove old dressing, discard into plastic bag and place into trash. Cleanse the wound with Normal Saline prior to applying a clean dressing using gauze sponges, not tissues or cotton balls. Do not scrub or use excessive force. Pat dry using gauze sponges, not tissue or cotton balls. Cleanser: Wound Cleanser 1 x Per Day/30 Days Discharge Instructions: Wash your hands with soap and water. Remove old dressing, discard into plastic bag and place into trash. Cleanse the wound with Wound Cleanser prior to applying a clean dressing using gauze sponges, not tissues or cotton balls. Do not scrub or use excessive force. Pat dry using gauze sponges, not tissue or cotton balls. Primary Dressing: Gauze (Generic) 1 x Per Day/30 Days Discharge Instructions: saline Wet to Dry applied in the office until the wound VAC is replaced by home health Secondary Dressing: ABD Pad 5x9 (in/in) (Generic) 1 x Per Day/30 Days Discharge Instructions: Cover with ABD pad Secured With: Medipore Tape - 4M Medipore H Soft Cloth Surgical Tape, 2x2 (in/yd) (Generic) 1 x Per  Day/30 Days Wound #4 - Ischial Tuberosity Wound Laterality: Left Cleanser: Normal Saline 3 x Per Week/30 Days Discharge Instructions: Wash your hands with soap and water. Remove old dressing, discard into plastic bag and place into trash. Cleanse the wound with Normal Saline prior to applying a clean dressing using gauze sponges, not tissues or cotton balls. Do not scrub or use excessive force. Pat dry using gauze sponges, not tissue or cotton balls. Cleanser: Wound Cleanser 3 x Per Week/30 Days Discharge Instructions: Wash your hands with soap and water. Remove old dressing, discard into plastic bag and place into trash. Cleanse the wound with Wound Cleanser prior to applying a clean dressing using gauze sponges, not  tissues or cotton balls. Do not scrub or use excessive force. Pat dry using gauze sponges, not tissue or cotton balls. Primary Dressing: Hydrofera Blue Ready Transfer Foam, 2.5x2.5 (in/in) 3 x Per Week/30 Days Discharge Instructions: Apply Hydrofera Blue Ready to wound bed as directed Secondary Dressing: (SILCONE BORDER) Zetuvit Plus SILICONE BORDER Dressing 5x5 (in/in) 3 x Per Week/30 Days Discharge Instructions: Please do not put silicone bordered dressings under wraps. Use non-bordered dressing only. Laboratory o Bacteria identified in Wound by Culture (MICRO) - left ischial tuberosity culture completed oooo LOINC Code: 6378-5 oooo Convenience Name: Wound culture routine Electronic Signature(s) Signed: 03/05/2022 4:36:57 PM By: Worthy Keeler PA-C Signed: 03/05/2022 4:40:28 PM By: Levora Dredge Entered By: Levora Dredge on 03/05/2022 14:55:55 [name] ([id number]) -------------------------------------------------------------------------------- Problem List Details Patient Name: [name] Date of Service: 03/05/2022 11:30 AM Medical Record Number: [id number] Patient Account Number: [id number] Date of Birth/Sex: [birth year] (56 y.o. M) Treating RN: Levora Dredge Primary Care Provider: Fulton Reek Other Clinician: Referring Provider: Fulton Reek Treating Provider/Extender: Skipper Cliche in Treatment: 7 Active Problems ICD-10 Encounter Code Description Active Date MDM Diagnosis L89.314 Pressure ulcer of right buttock, stage 4 01/15/2022 No Yes L89.323 Pressure ulcer of left buttock, stage 3 02/05/2022 No Yes Q76.0 Spina bifida occulta 01/15/2022 No Yes Z93.3 Colostomy status 01/15/2022 No Yes I10 Essential (primary) hypertension 01/15/2022 No Yes I25.10 Atherosclerotic heart disease of native coronary artery without angina 01/15/2022 No Yes pectoris Inactive Problems Resolved Problems Electronic Signature(s) Signed: 03/05/2022 11:35:04 AM By: Worthy Keeler PA-C Entered By: Worthy Keeler on 03/05/2022 11:35:04 [name] ([id number]) -------------------------------------------------------------------------------- Progress Note Details Patient Name: [name] Date of Service: 03/05/2022 11:30 AM Medical Record Number: [id number] Patient Account Number: [id number] Date of Birth/Sex: 1966/06/30 (55 y.o. M) Treating RN: Levora Dredge Primary Care Provider: Fulton Reek Other Clinician: Referring Provider: Fulton Reek Treating Provider/Extender: Skipper Cliche in Treatment: 7 Subjective Chief Complaint Information obtained from Patient Right ischial tuberosity pressure ulcer History of Present Illness (HPI) 56 year old male with a history of spina bifida presenting to [place] with a history of a open wound on the left gluteal region near his upper thigh which she's had for several weeks. He was seen in the ED recently at [facility] and was put on doxycycline and was advised some local care. his past medical history is significant for spinal bifid, neurogenic bladder, kidney stones, sacral pressure sores, constipation. Past surgical history significant for partial cystectomy, ileal conduit, VP shunt removal, percutaneous nephro lithotripsy. In the remote past the patient says he's had some treatment for a ulcer on this area and was treated with a skin graft. Readmission: 10/16/2021 upon evaluation today patient appears to be doing somewhat poorly in regard to a fairly large wound noted over the right ischial tuberosity  location. This is a stage IV pressure ulcer. Of note this is also positive for osteomyelitis upon a CT scan which was performed during the time that he was admitted in the hospital on 09/19/2021. With that being said it is noted in the right inferior buttock that he has a decubitus ulcer which extends to the posterior toe inferior right ischium where there is evidence of osteomyelitis. When he was  here in the office actually missed that this was positive I missed read it and thought it said that there was no osteomyelitis. That is not the case there is in fact osteomyelitis noted here. I actually did review his notes as well in epic. Looking to the discharge summary it appears that the patient was admitted from 09/19/2021 through 09/23/2021. He was discharged home with home health care according to the note. With that being said from what I understand his sister actually takes care of him at this point. He was also to follow-up with a primary care provider and here at the wound care center within a week. I am just now seeing him on the 13th almost a month after discharge. He does have a history of again osteomyelitis of this right hip/ischial tuberosity location. 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 The reason for his admission was actually worsening of the decubitus ulcer upon admission. He does have chronic osteomyelitis of this area. He was treated with empiric antibiotics he had acute kidney injury which improved with IV fluids he also had hypokalemia. Surgical debridement was performed by general surgery at that time and ID was consulted to assist with management according to the notes. IV antibiotics were recommended but patient declined and wanted oral antibiotics at discharge. Therefore no IV antibiotics were planned for discharge time. This case was under the care of Dr. Steva Ready while he was in the hospital when she did discharge him with a 2-week course of doxycycline and Augmentin according to the notes. It looks like Dr. Doy Hutching office did call and leave a message for the patient to get scheduled for a follow-up visit after hospital discharge I do not see where he showed up in fact it appears that the patient has a no- show  letter from Dr. Doy Hutching office noted in epic due to a appointment that was missed on 10/06/2021. Looking at the pictures from Dr. Raelene Bott notes on 09/23/2021 it does appear that the wound is a little bit better as far as the cleanliness of the surface of the wound today but nonetheless still is a very significant wound. 10/26/2021 upon evaluation today patient appears to be doing okay in regard to his wound. Again this is a wound that he did indeed have osteomyelitis and previous. With that being said I do not see any signs of a worsening infection at this time which is good news. Overall I think that we are headed in the right direction although still I think he needs more aggressive offloading. 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 I do believe this would be ideal for him to be honest. Readmission: 01-15-2022 upon evaluation today patient presents for reevaluation he was last seen on October 26, 2021. At that time unfortunately he was having issues with a significant ischial pressure ulcer on the right ischial tuberosity. Subsequently he  ended up having decent response to the Dakin's moistened gauze dressing that was previously being utilized and subsequently the patient's sister who is his primary caregiver as well states that she decided just to try to manage this at home. Nonetheless unfortunately this is getting a little larger not better. He does spend a lot of the day in his chair. He does have an air mattress according what they tell me today. That was previously ordered. With that being said I do believe that at this time things do appear to be doing much better from the standpoint of infection I do not see any signs of overt infection. There is also no evidence of systemic infection. No fevers, chills, nausea, vomiting, or diarrhea. With that being said I do believe based on what I am seeing the wound is a bit cleaner and he possibly could be an excellent  candidate for a wound VAC. 02-05-2022 upon evaluation today patient appears to be doing about the same in regard to his wound. Unfortunately he is not any better but he also really has not had the wound VAC on sufficiently. There is been some confusion with the orders part of that I think is our follow-up with the way the order was written part of it may be with how the wound VAC is being placed either way we need to see what we can do to try to improve things in this regard. We will clarify the orders today going forward for home health. Unfortunately the patient does have a new wound today which is on the opposite side. This is not good 1 was bad enough have another area to open is definitely not a good thing. Its not as deep but nonetheless is also not very shallow either. We may potentially be able to get this to the point that we could bridge the 2 together in order to wound VAC both but right now want a focus on to try to get the wound VAC going for the main wound initially we will likely use Hydrofera Blue on the new wound.Marland Kitchen 02-19-2022 upon evaluation today patient's wound appears to be doing a little bit worse in the way of maceration fortunately there is no signs of active infection locally or systemically which is great news. No fevers, chills, nausea, vomiting, or diarrhea. [name], [name] ([id number]) 03-05-2022 upon evaluation today patient unfortunately does not appear to be doing nearly as good in regard to his wounds as I would like to see. Fortunately I do not see any evidence of active infection that is obvious although I am can obtain a wound culture to ensure that there is not anything getting worse here as far as the wound without the wound VAC on his concern. With that being said I will go ahead and get this sent in and evaluated will initiate treatment as needed going forward if necessary. With regard to the wound with the wound VAC this continues to be placed directly over the  wound and there is obvious signs of deep tissue injury. The suction being here means that he is sitting on it I think this may be part of the reason why it is coming off although not 588% certain. Objective Constitutional Chronically ill appearing but in no apparent acute distress. Vitals Time Taken: 11:34 AM, Height: 60 in, Temperature: 98.2 F, Pulse: 89 bpm, Respiratory Rate: 18 breaths/min, Blood Pressure: 104/79 mmHg. Respiratory normal breathing without difficulty. Psychiatric this patient is able to make decisions and demonstrates good  insight into disease process. Alert and Oriented x 3. pleasant and cooperative. General Notes: Upon inspection patient's wound bed actually showed signs of again deep tissue injury in the main wound which was original in the largest I do believe this could be related to the wound VAC to a degree be in place with suction directly over this area as opposed to bridging this which was in the orders from last time. I did actually contact the home health company, [employer], and the clinical manager is going to have an RN go out and ensure that the wound VAC is being applied appropriately.With that being said in the meantime for the other wound I did obtain a wound culture we will get a continue with the [facility] although I want to make sure that there is no signs of active infection. Integumentary (Hair, Skin) Wound #3 status is Open. Original cause of wound was Gradually Appeared. The date acquired was: 09/03/2021. The wound has been in treatment 7 weeks. The wound is located on the Right Ischial Tuberosity. The wound measures 2.5cm length x 7.3cm width x 1.5cm depth; 14.334cm^2 area and 21.5cm^3 volume. There is Fat Layer (Subcutaneous Tissue) exposed. There is no tunneling or undermining noted. There is a large amount of serosanguineous drainage noted. There is medium (34-66%) red, pink granulation within the wound bed. There is a medium (34-66%) amount  of necrotic tissue within the wound bed including Eschar and Adherent Slough. Wound #4 status is Open. Original cause of wound was Gradually Appeared. The date acquired was: 01/29/2022. The wound has been in treatment 4 weeks. The wound is located on the Left Ischial Tuberosity. The wound measures 2cm length x 1cm width x 0.6cm depth; 1.571cm^2 area and 0.942cm^3 volume. There is Fat Layer (Subcutaneous Tissue) exposed. There is no tunneling noted, however, there is undermining starting at 6:00 and ending at 11:00 with a maximum distance of 0.8cm. There is a medium amount of serosanguineous drainage noted. There is large (67- 100%) red granulation within the wound bed. There is a small (1-33%) amount of necrotic tissue within the wound bed including Adherent Slough. Assessment Active Problems ICD-10 Pressure ulcer of right buttock, stage 4 Pressure ulcer of left buttock, stage 3 Spina bifida occulta Colostomy status Essential (primary) hypertension Atherosclerotic heart disease of native coronary artery without angina pectoris Plan Follow-up Appointments: Return Appointment in 2 weeks. [name], [name] ([id number]) Nurse Visit as needed [place]: - [facility] ADMIT to [facility] for wound care. May utilize formulary equivalent dressing for wound treatment orders unless otherwise specified. Home Health Nurse may visit [facility] to address patient s wound care needs. Scheduled days for dressing changes to be completed; exception, patient has scheduled wound care visit that day. **Please direct any NON-WOUND related issues/requests for orders to patient's Primary Care Physician. **If current dressing causes regression in wound condition, may D/C ordered dressing product/s and apply Normal Saline Moist Dressing daily until next [place] or Other MD appointment. **Notify [facility] of regression in wound condition at [phone]. Bathing/ Shower/  Hygiene: Clean wound with Normal Saline or wound cleanser. - keep dressing dry or change after shower No tub bath. Off-Loading: Gel wheelchair cushion Hospital bed/mattress - Has at home Air fluidized (Group 3) - Has from [employer] and reposition every 2 hours - Do not sit in wheelchair any longer than you have to--offload wound in bed as much as possible Additional Orders / Instructions: Follow Nutritious Diet and Increase Protein  Intake Other: - Refer to ostomy clinic in [facility] if desired Negative Pressure Wound Therapy: Wound #3 Right Ischial Tuberosity: Wound VAC settings at 199mHg continuous pressure. Use foam to wound cavity. Please order WHITE foam to fill any tunnel/s and/or undermining when necessary. Change VAC dressing 3 X WEEK. Change canister as indicated when full. - Change Monday, Wednesday, and Friday Home Health Nurse may d/c VAC for s/s of increased infection, significant wound regression, or uncontrolled drainage. [phone] Number of foam/gauze pieces used in the dressing = - 1 piece of black foam place into the wound bed. This needs to be bridged to the side of the abdomen/hip area so that the patient will not be sitting on the suction pad and tubing when up in his chair. This will also help the suction pad to stay in place. WOUND #3: - Ischial Tuberosity Wound Laterality: Right Cleanser: Byram Ancillary Kit - 15 Day Supply (Generic) 1 x Per Day/30 Days Discharge Instructions: Use supplies as instructed; Kit contains: (15) Saline Bullets; (15) 3x3 Gauze; 15 pr Gloves Cleanser: Normal Saline 1 x Per Day/30 Days Discharge Instructions: Wash your hands with soap and water. Remove old dressing, discard into plastic bag and place into trash. Cleanse the wound with Normal Saline prior to applying a clean dressing using gauze sponges, not tissues or cotton balls. Do not scrub or use excessive force. Pat dry using gauze sponges, not  tissue or cotton balls. Cleanser: Wound Cleanser 1 x Per Day/30 Days Discharge Instructions: Wash your hands with soap and water. Remove old dressing, discard into plastic bag and place into trash. Cleanse the wound with Wound Cleanser prior to applying a clean dressing using gauze sponges, not tissues or cotton balls. Do not scrub or use excessive force. Pat dry using gauze sponges, not tissue or cotton balls. Primary Dressing: Gauze (Generic) 1 x Per Day/30 Days Discharge Instructions: saline Wet to Dry applied in the office until the wound VAC is replaced by home health Secondary Dressing: ABD Pad 5x9 (in/in) (Generic) 1 x Per Day/30 Days Discharge Instructions: Cover with ABD pad Secured With: Medipore Tape - 107M Medipore H Soft Cloth Surgical Tape, 2x2 (in/yd) (Generic) 1 x Per Day/30 Days WOUND #4: - Ischial Tuberosity Wound Laterality: Left Cleanser: Normal Saline 3 x Per Week/30 Days Discharge Instructions: Wash your hands with soap and water. Remove old dressing, discard into plastic bag and place into trash. Cleanse the wound with Normal Saline prior to applying a clean dressing using gauze sponges, not tissues or cotton balls. Do not scrub or use excessive force. Pat dry using gauze sponges, not tissue or cotton balls. Cleanser: Wound Cleanser 3 x Per Week/30 Days Discharge Instructions: Wash your hands with soap and water. Remove old dressing, discard into plastic bag and place into trash. Cleanse the wound with Wound Cleanser prior to applying a clean dressing using gauze sponges, not tissues or cotton balls. Do not scrub or use excessive force. Pat dry using gauze sponges, not tissue or cotton balls. Primary Dressing: Hydrofera Blue Ready Transfer Foam, 2.5x2.5 (in/in) 3 x Per Week/30 Days Discharge Instructions: Apply Hydrofera Blue Ready to wound bed as directed Secondary Dressing: (SILCONE BORDER) Zetuvit Plus SILICONE BORDER Dressing 5x5 (in/in) 3 x Per Week/30 Days Discharge  Instructions: Please do not put silicone bordered dressings under wraps. Use non-bordered dressing only. 1. I am going obtain a wound culture and we will see what that shows we will initiate treatment as necessary going  forward. 2. I am also can recommend we have the patient continue to monitor for any signs of worsening infection if anything changes he should let me know or his sister should let me know. With that being said if we identify bacteria on culture we will initiate treatment as necessary Pseudomonas is what I was most concerned about. 3. I am also can recommend appropriate offloading he needs to not be sitting up or in the bed laying on his back consistently. I think he is not sitting as much but he is laying on his back quite a bit based on his reaction when I was talking to him about this. Nonetheless I think that needs to be addressed and he needs to be much more careful in that regard. The patient voiced understanding. 4. I did talk with home health, [facility], and specifically spoke with the nurse manager. Subsequently she is good to have an RN go out to make sure that the wound VAC has been applied appropriately which I think is definitely the way to go here. Overall I am hopeful they will be able to bridge this appropriately as it will not be beeping and will stay in place better as well as allowing it to continue to heal more appropriately. We will see patient back for reevaluation in 1 week here in the clinic. If anything worsens or changes patient will contact our office for additional recommendations. Electronic Signature(s) [place], [place] ([id number]) Signed: 03/05/2022 1:23:44 PM By: Worthy Keeler PA-C Entered By: Worthy Keeler on 03/05/2022 13:23:44 [name] ([id number]) -------------------------------------------------------------------------------- SuperBill Details Patient Name: [name] Date of Service: 03/05/2022 Medical Record Number:  [id number] Patient Account Number: [id number] Date of Birth/Sex: 21-Sep-1966 (55 y.o. M) Treating RN: Levora Dredge Primary Care Provider: Fulton Reek Other Clinician: Referring Provider: Fulton Reek Treating Provider/Extender: Skipper Cliche in Treatment: 7 Diagnosis Coding ICD-10 Codes Code Description L89.314 Pressure ulcer of right buttock, stage 4 L89.323 Pressure ulcer of left buttock, stage 3 Q76.0 Spina bifida occulta Z93.3 Colostomy status I10 Essential (primary) hypertension I25.10 Atherosclerotic heart disease of native coronary artery without angina pectoris Facility Procedures CPT4 Code: [id number] Description: 99214 - WOUND CARE VISIT-LEV 4 EST PT Modifier: Quantity: 1 Physician Procedures CPT4 Code: [id number] Description: 99214 - WC PHYS LEVEL 4 - EST PT Modifier: Quantity: 1 CPT4 Code: Description: ICD-10 Diagnosis Description L89.314 Pressure ulcer of right buttock, stage 4 L89.323 Pressure ulcer of left buttock, stage 3 Q76.0 Spina bifida occulta Z93.3 Colostomy status Modifier: Quantity: Electronic Signature(s) Signed: 03/05/2022 3:23:18 PM By: Levora Dredge Signed: 03/05/2022 4:36:57 PM By: Worthy Keeler PA-C Previous Signature: 03/05/2022 1:24:03 PM Version By: Worthy Keeler PA-C Entered By: Levora Dredge on 03/05/2022 15:23:18

## 2022-03-07 LAB — AEROBIC CULTURE W GRAM STAIN (SUPERFICIAL SPECIMEN): Gram Stain: NONE SEEN

## 2022-03-08 ENCOUNTER — Ambulatory Visit: Payer: Medicare HMO | Admitting: Podiatry

## 2022-03-12 ENCOUNTER — Encounter: Payer: Medicare Other | Admitting: Physician Assistant

## 2022-03-12 DIAGNOSIS — L89314 Pressure ulcer of right buttock, stage 4: Secondary | ICD-10-CM | POA: Diagnosis not present

## 2022-03-12 NOTE — Progress Notes (Signed)
NATION, CRADLE ([id number]) Visit Report for 03/12/2022 Chief Complaint Document Details Patient Name: [name], [name] Date of Service: 03/12/2022 11:30 AM Medical Record Number: [id number] Patient Account Number: [id number] Date of Birth/Sex: 05-16-1966 (56 y.o. M) Treating RN: Levora Dredge Primary Care Provider: Fulton Reek Other Clinician: Referring Provider: Fulton Reek Treating Provider/Extender: Skipper Cliche in Treatment: 8 Information Obtained from: Patient Chief Complaint Right ischial tuberosity pressure ulcer Electronic Signature(s) Signed: 03/12/2022 11:36:05 AM By: Worthy Keeler PA-C Entered By: Worthy Keeler on 03/12/2022 11:36:05 [name] ([id number]) -------------------------------------------------------------------------------- Physician Orders Details Patient Name: [name] Date of Service: 03/12/2022 11:30 AM Medical Record Number: [id number] Patient Account Number: [id number] Date of Birth/Sex: 06-24-1966 (55 y.o. M) Treating RN: Levora Dredge Primary Care Provider: Fulton Reek Other Clinician: Referring Provider: Fulton Reek Treating Provider/Extender: Skipper Cliche in Treatment: 8 Verbal / Phone Orders: No Diagnosis Coding ICD-10 Coding Code Description L89.314 Pressure ulcer of right buttock, stage 4 L89.323 Pressure ulcer of left buttock, stage 3 Q76.0 Spina bifida occulta Z93.3 Colostomy status I10 Essential (primary) hypertension I25.10 Atherosclerotic heart disease of native coronary artery without angina pectoris Follow-up Appointments o Return Appointment in 2 weeks. o Nurse Visit as needed [name]: - [name] o ADMIT to [facility] for wound care. May utilize formulary equivalent dressing for wound treatment orders unless otherwise specified. Home Health Nurse may visit PR[facility] to address patientos wound care needs. o Scheduled days for dressing changes to be  completed; exception, patient has scheduled wound care visit that day. o **Please direct any NON-WOUND related issues/requests for orders to patient's Primary Care Physician. **If current dressing causes regression in wound condition, may D/C ordered dressing product/s and apply Normal Saline Moist Dressing daily until next [place] or Other MD appointment. **Notify [facility] of regression in wound condition at [phone]. Bathing/ Shower/ Hygiene o Clean wound with Normal Saline or wound cleanser. - keep dressing dry or change after shower o No tub bath. Off-Loading o Gel wheelchair cushion o Hospital bed/mattress - Has at home o Air fluidized (Group 3) - Has from [employer] and reposition every 2 hours - Do not sit in wheelchair any longer than you have to--offload wound in bed as much as possible Additional Orders / Instructions o Follow Nutritious Diet and Increase Protein Intake o Other: - Refer to ostomy clinic in [facility] if desired Negative Pressure Wound Therapy Wound #3 Right Ischial Tuberosity o Wound VAC settings at 1105mHg continuous pressure. Use foam to wound cavity. Please order WHITE foam to fill any tunnel/s and/or undermining when necessary. Change VAC dressing 3 X WEEK. Change canister as indicated when full. - Change Monday, Wednesday, and Friday o Home Health Nurse may d/c VAC for s/s of increased infection, significant wound regression, or uncontrolled drainage. [place] [phone] o Number of foam/gauze pieces used in the dressing = - 1 piece of black foam place into the wound bed. This needs to be bridged to the side of the abdomen/hip area so that the patient will not be sitting on the suction pad and tubing when up in his chair. This will also help the suction pad to stay in place. o Other: - Please do not remove wound vac prior to wound clinic visit so we can assess  application Medications-Please add to medication list. o P.O. Antibiotics - PLEASE pick up you called in antibiotic and start ASAP Wound Treatment Wound #3 - Ischial Tuberosity Wound Laterality:  Right Cleanser: Byram Ancillary Kit - 15 Day Supply (Generic) 1 x Per Day/30 Days ARIUS, HARNOIS ([id number]) Discharge Instructions: Use supplies as instructed; Kit contains: (15) Saline Bullets; (15) 3x3 Gauze; 15 pr Gloves Cleanser: Normal Saline 1 x Per Day/30 Days Discharge Instructions: Wash your hands with soap and water. Remove old dressing, discard into plastic bag and place into trash. Cleanse the wound with Normal Saline prior to applying a clean dressing using gauze sponges, not tissues or cotton balls. Do not scrub or use excessive force. Pat dry using gauze sponges, not tissue or cotton balls. Cleanser: Wound Cleanser 1 x Per Day/30 Days Discharge Instructions: Wash your hands with soap and water. Remove old dressing, discard into plastic bag and place into trash. Cleanse the wound with Wound Cleanser prior to applying a clean dressing using gauze sponges, not tissues or cotton balls. Do not scrub or use excessive force. Pat dry using gauze sponges, not tissue or cotton balls. Primary Dressing: Gauze (Generic) 1 x Per Day/30 Days Discharge Instructions: saline Wet to Dry applied in the office until the wound VAC is replaced by home health Secondary Dressing: ABD Pad 5x9 (in/in) (Generic) 1 x Per Day/30 Days Discharge Instructions: Cover with ABD pad Secured With: Medipore Tape - 43M Medipore H Soft Cloth Surgical Tape, 2x2 (in/yd) (Generic) 1 x Per Day/30 Days Wound #4 - Ischial Tuberosity Wound Laterality: Left Cleanser: Normal Saline 3 x Per Week/30 Days Discharge Instructions: Wash your hands with soap and water. Remove old dressing, discard into plastic bag and place into trash. Cleanse the wound with Normal Saline prior to applying a clean dressing using gauze sponges, not  tissues or cotton balls. Do not scrub or use excessive force. Pat dry using gauze sponges, not tissue or cotton balls. Cleanser: Wound Cleanser 3 x Per Week/30 Days Discharge Instructions: Wash your hands with soap and water. Remove old dressing, discard into plastic bag and place into trash. Cleanse the wound with Wound Cleanser prior to applying a clean dressing using gauze sponges, not tissues or cotton balls. Do not scrub or use excessive force. Pat dry using gauze sponges, not tissue or cotton balls. Primary Dressing: Hydrofera Blue Ready Transfer Foam, 2.5x2.5 (in/in) 3 x Per Week/30 Days Discharge Instructions: Apply Hydrofera Blue Ready to wound bed as directed Secondary Dressing: (SILCONE BORDER) Zetuvit Plus SILICONE BORDER Dressing 5x5 (in/in) 3 x Per Week/30 Days Discharge Instructions: Please do not put silicone bordered dressings under wraps. Use non-bordered dressing only. Electronic Signature(s) Signed: 03/12/2022 1:33:56 PM By: Levora Dredge Entered By: Levora Dredge on 03/12/2022 13:33:55 [name] ([id number]) -------------------------------------------------------------------------------- Problem List Details Patient Name: [name] Date of Service: 03/12/2022 11:30 AM Medical Record Number: [id number] Patient Account Number: [id number] Date of Birth/Sex: 11-14-1965 (55 y.o. M) Treating RN: Levora Dredge Primary Care Provider: Fulton Reek Other Clinician: Referring Provider: Fulton Reek Treating Provider/Extender: Skipper Cliche in Treatment: 8 Active Problems ICD-10 Encounter Code Description Active Date MDM Diagnosis L89.314 Pressure ulcer of right buttock, stage 4 01/15/2022 No Yes L89.323 Pressure ulcer of left buttock, stage 3 02/05/2022 No Yes Q76.0 Spina bifida occulta 01/15/2022 No Yes Z93.3 Colostomy status 01/15/2022 No Yes I10 Essential (primary) hypertension 01/15/2022 No Yes I25.10 Atherosclerotic heart disease of native  coronary artery without angina 01/15/2022 No Yes pectoris Inactive Problems Resolved Problems Electronic Signature(s) Signed: 03/12/2022 11:35:59 AM By: Worthy Keeler PA-C Entered By: Worthy Keeler on 03/12/2022 11:35:58 [name] ([id number]) -------------------------------------------------------------------------------- SuperBill Details Patient Name: [name] Date of Service:  03/12/2022 Medical Record Number: [id number] Patient Account Number: [id number] Date of Birth/Sex: 09/03/1966 (56 y.o. M) Treating RN: Levora Dredge Primary Care Provider: Fulton Reek Other Clinician: Referring Provider: Fulton Reek Treating Provider/Extender: Skipper Cliche in Treatment: 8 Diagnosis Coding ICD-10 Codes Code Description L89.314 Pressure ulcer of right buttock, stage 4 L89.323 Pressure ulcer of left buttock, stage 3 Q76.0 Spina bifida occulta Z93.3 Colostomy status I10 Essential (primary) hypertension I25.10 Atherosclerotic heart disease of native coronary artery without angina pectoris Facility Procedures CPT4 Code: [id number] Description: [place] VISIT-LEV 3 EST PT Modifier: Quantity: 1 Electronic Signature(s) Signed: 03/12/2022 1:35:14 PM By: Levora Dredge Entered By: Levora Dredge on 03/12/2022 13:35:14

## 2022-03-12 NOTE — Progress Notes (Addendum)
JOASH, TONY ([id number]) Visit Report for 03/12/2022 Arrival Information Details Patient Name: [name], [name] Date of Service: 03/12/2022 11:30 AM Medical Record Number: [id number] Patient Account Number: [id number] Date of Birth/Sex: January 18, 1966 (56 y.o. M) Treating RN: Levora Dredge Primary Care Yasmene Salomone: Fulton Reek Other Clinician: Referring Angelys Yetman: Fulton Reek Treating Celese Banner/Extender: Skipper Cliche in Treatment: 8 Visit Information History Since Last Visit Added or deleted any medications: No Patient Arrived: Wheel Chair Any new allergies or adverse reactions: No Arrival Time: 11:38 Had a fall or experienced change in No Accompanied By: self activities of daily living that may affect Transfer Assistance: Transfer Board risk of falls: Patient Identification Verified: Yes Hospitalized since last visit: No Secondary Verification Process Completed: Yes Has Dressing in Place as Prescribed: Yes Patient Requires Transmission-Based Precautions: No Pain Present Now: No Patient Has Alerts: Yes Patient Alerts: NOT diabetic Electronic Signature(s) Signed: 03/12/2022 4:15:12 PM By: Levora Dredge Entered By: Levora Dredge on 03/12/2022 11:46:21 [name] ([id number]) -------------------------------------------------------------------------------- Clinic Level of Care Assessment Details Patient Name: [name] Date of Service: 03/12/2022 11:30 AM Medical Record Number: [id number] Patient Account Number: [id number] Date of Birth/Sex: 1965-11-14 (55 y.o. M) Treating RN: Levora Dredge Primary Care Llewelyn Sheaffer: Fulton Reek Other Clinician: Referring Gordana Kewley: Fulton Reek Treating Miliyah Luper/Extender: Skipper Cliche in Treatment: 8 Clinic Level of Care Assessment Items TOOL 4 Quantity Score '[]'  - Use when only an EandM is performed on FOLLOW-UP visit 0 ASSESSMENTS - Nursing Assessment / Reassessment X - Reassessment of Co-morbidities  (includes updates in patient status) 1 10 '[]'  - 0 Reassessment of Adherence to Treatment Plan ASSESSMENTS - Wound and Skin Assessment / Reassessment '[]'  - Simple Wound Assessment / Reassessment - one wound 0 X- 2 5 Complex Wound Assessment / Reassessment - multiple wounds '[]'  - 0 Dermatologic / Skin Assessment (not related to wound area) ASSESSMENTS - Focused Assessment '[]'  - Circumferential Edema Measurements - multi extremities 0 '[]'  - 0 Nutritional Assessment / Counseling / Intervention '[]'  - 0 Lower Extremity Assessment (monofilament, tuning fork, pulses) '[]'  - 0 Peripheral Arterial Disease Assessment (using hand held doppler) ASSESSMENTS - Ostomy and/or Continence Assessment and Care '[]'  - Incontinence Assessment and Management 0 '[]'  - 0 Ostomy Care Assessment and Management (repouching, etc.) PROCESS - Coordination of Care X - Simple Patient / Family Education for ongoing care 1 15 '[]'  - 0 Complex (extensive) Patient / Family Education for ongoing care '[]'  - 0 Staff obtains Programmer, systems, Records, Test Results / Process Orders '[]'  - 0 Staff telephones HHA, Nursing Homes / Clarify orders / etc '[]'  - 0 Routine Transfer to another Facility (non-emergent condition) '[]'  - 0 Routine Hospital Admission (non-emergent condition) '[]'  - 0 New Admissions / Biomedical engineer / Ordering NPWT, Apligraf, etc. '[]'  - 0 Emergency Hospital Admission (emergent condition) X- 1 10 Simple Discharge Coordination '[]'  - 0 Complex (extensive) Discharge Coordination PROCESS - Special Needs '[]'  - Pediatric / Minor Patient Management 0 '[]'  - 0 Isolation Patient Management '[]'  - 0 Hearing / Language / Visual special needs '[]'  - 0 Assessment of Community assistance (transportation, D/C planning, etc.) '[]'  - 0 Additional assistance / Altered mentation '[]'  - 0 Support Surface(s) Assessment (bed, cushion, seat, etc.) INTERVENTIONS - Wound Cleansing / Measurement VAISHNAV, DEMARTIN ([id number]) '[]'  - 0 Simple Wound  Cleansing - one wound X- 2 5 Complex Wound Cleansing - multiple wounds X- 1 5 Wound Imaging (photographs - any number of wounds) '[]'  - 0 Wound Tracing (instead of photographs) '[]'  - 0 Simple Wound Measurement - one  wound X- 2 5 Complex Wound Measurement - multiple wounds INTERVENTIONS - Wound Dressings '[]'  - Small Wound Dressing one or multiple wounds 0 X- 2 15 Medium Wound Dressing one or multiple wounds '[]'  - 0 Large Wound Dressing one or multiple wounds '[]'  - 0 Application of Medications - topical '[]'  - 0 Application of Medications - injection INTERVENTIONS - Miscellaneous '[]'  - External ear exam 0 '[]'  - 0 Specimen Collection (cultures, biopsies, blood, body fluids, etc.) '[]'  - 0 Specimen(s) / Culture(s) sent or taken to Lab for analysis X- 1 10 Patient Transfer (multiple staff / Harrel Lemon Lift / Similar devices) '[]'  - 0 Simple Staple / Suture removal (25 or less) '[]'  - 0 Complex Staple / Suture removal (26 or more) '[]'  - 0 Hypo / Hyperglycemic Management (close monitor of Blood Glucose) '[]'  - 0 Ankle / Brachial Index (ABI) - do not check if billed separately X- 1 5 Vital Signs Has the patient been seen at the hospital within the last three years: Yes Total Score: 115 Level Of Care: New/Established - Level 3 Electronic Signature(s) Signed: 03/12/2022 4:15:12 PM By: Levora Dredge Entered By: Levora Dredge on 03/12/2022 13:35:08 [name] ([id number]) -------------------------------------------------------------------------------- Encounter Discharge Information Details Patient Name: [name] Date of Service: 03/12/2022 11:30 AM Medical Record Number: [id number] Patient Account Number: [id number] Date of Birth/Sex: 11/27/1965 (55 y.o. M) Treating RN: Levora Dredge Primary Care Javeah Loeza: Fulton Reek Other Clinician: Referring Kosei Rhodes: Fulton Reek Treating Khameron Gruenwald/Extender: Skipper Cliche in Treatment: 8 Encounter Discharge Information  Items Discharge Condition: Stable Ambulatory Status: Wheelchair Discharge Destination: Home Transportation: Other Accompanied By: self Schedule Follow-up Appointment: Yes Clinical Summary of Care: Electronic Signature(s) Signed: 03/12/2022 1:37:14 PM By: Levora Dredge Entered By: Levora Dredge on 03/12/2022 13:37:14 [name] ([id number]) -------------------------------------------------------------------------------- Lower Extremity Assessment Details Patient Name: [name] Date of Service: 03/12/2022 11:30 AM Medical Record Number: [id number] Patient Account Number: [id number] Date of Birth/Sex: 1966-04-23 (55 y.o. M) Treating RN: Levora Dredge Primary Care Layken Doenges: Fulton Reek Other Clinician: Referring Ricky Gallery: Fulton Reek Treating Kayn Haymore/Extender: Skipper Cliche in Treatment: 8 Electronic Signature(s) Signed: 03/12/2022 4:15:12 PM By: Levora Dredge Entered By: Levora Dredge on 03/12/2022 12:00:23 [name] ([id number]) -------------------------------------------------------------------------------- Multi Wound Chart Details Patient Name: [name] Date of Service: 03/12/2022 11:30 AM Medical Record Number: [id number] Patient Account Number: [id number] Date of Birth/Sex: 1966-08-11 (55 y.o. M) Treating RN: Levora Dredge Primary Care Mekhia Brogan: Fulton Reek Other Clinician: Referring Chiffon Kittleson: Fulton Reek Treating Callum Wolf/Extender: Skipper Cliche in Treatment: 8 Vital Signs Height(in): 60 Pulse(bpm): 83 Weight(lbs): Blood Pressure(mmHg): 107/73 Body Mass Index(BMI): Temperature(F): 97.9 Respiratory Rate(breaths/min): 18 Photos: [N/A:N/A] Wound Location: Right Ischial Tuberosity Left Ischial Tuberosity N/A Wounding Event: Gradually Appeared Gradually Appeared N/A Primary Etiology: Pressure Ulcer Pressure Ulcer N/A Comorbid History: Cataracts, Glaucoma, Coronary Cataracts, Glaucoma, Coronary N/A Artery  Disease, Hypertension, Artery Disease, Hypertension, History of pressure wounds, History of pressure wounds, Osteoarthritis, Paraplegia Osteoarthritis, Paraplegia Date Acquired: 09/03/2021 01/29/2022 N/A Weeks of Treatment: 8 5 N/A Wound Status: Open Open N/A Wound Recurrence: No No N/A Measurements L x W x D (cm) 2.5x5.5x2 2x1x1 N/A Area (cm) : 10.799 1.571 N/A Volume (cm) : 21.598 1.571 N/A % Reduction in Area: 38.60% -33.40% N/A % Reduction in Volume: 50.90% 52.40% N/A Starting Position 1 (o'clock): 6 Ending Position 1 (o'clock): 11 Maximum Distance 1 (cm): 0.8 Undermining: No Yes N/A Classification: Category/Stage IV Category/Stage IV N/A Exudate Amount: Large Medium N/A Exudate Type: Serosanguineous Serosanguineous N/A Exudate Color: red, brown red, brown N/A Granulation Amount: Medium (  34-66%) Large (67-100%) N/A Granulation Quality: Red, Pink Red N/A Necrotic Amount: Medium (34-66%) Small (1-33%) N/A Necrotic Tissue: Eschar, Adherent [place] N/A Exposed Structures: Fat Layer (Subcutaneous Tissue): Fat Layer (Subcutaneous Tissue): N/A Yes Yes Epithelialization: None Small (1-33%) N/A Treatment Notes Electronic Signature(s) Signed: 03/12/2022 4:15:12 PM By: Levora Dredge Entered By: Levora Dredge on 03/12/2022 12:03:29 [name] ([id number]) -------------------------------------------------------------------------------- Staples Details Patient Name: [name] Date of Service: 03/12/2022 11:30 AM Medical Record Number: [id number] Patient Account Number: [id number] Date of Birth/Sex: June 02, 1966 (55 y.o. M) Treating RN: Levora Dredge Primary Care Kavontae Pritchard: Fulton Reek Other Clinician: Referring Jakyrah Holladay: Fulton Reek Treating Shandrika Ambers/Extender: Skipper Cliche in Treatment: 8 Active Inactive Pressure Nursing Diagnoses: Knowledge deficit related to causes and risk factors for pressure ulcer  development Knowledge deficit related to management of pressures ulcers Potential for impaired tissue integrity related to pressure, friction, moisture, and shear Goals: Patient will remain free from development of additional pressure ulcers Date Initiated: 01/15/2022 Target Resolution Date: 02/26/2022 Goal Status: Active Patient/caregiver will verbalize risk factors for pressure ulcer development Date Initiated: 01/15/2022 Target Resolution Date: 03/05/2022 Goal Status: Active Interventions: Assess: immobility, friction, shearing, incontinence upon admission and as needed Assess offloading mechanisms upon admission and as needed Assess potential for pressure ulcer upon admission and as needed Notes: Electronic Signature(s) Signed: 03/12/2022 4:15:12 PM By: Levora Dredge Entered By: Levora Dredge on 03/12/2022 12:03:20 [name] ([id number]) -------------------------------------------------------------------------------- Pain Assessment Details Patient Name: [name] Date of Service: 03/12/2022 11:30 AM Medical Record Number: [id number] Patient Account Number: [id number] Date of Birth/Sex: 12-Jan-1966 (56 y.o. M) Treating RN: Levora Dredge Primary Care Gregrey Bloyd: Fulton Reek Other Clinician: Referring Crews Mccollam: Fulton Reek Treating Rey Dansby/Extender: Skipper Cliche in Treatment: 8 Active Problems Location of Pain Severity and Description of Pain Patient Has Paino No Site Locations Rate the pain. Current Pain Level: 0 Pain Management and Medication Current Pain Management: Electronic Signature(s) Signed: 03/12/2022 4:15:12 PM By: Levora Dredge Entered By: Levora Dredge on 03/12/2022 11:47:20 [name] ([id number]) -------------------------------------------------------------------------------- Patient/Caregiver Education Details Patient Name: [name] Date of Service: 03/12/2022 11:30 AM Medical Record Number: [id number] Patient  Account Number: [id number] Date of Birth/Gender: 10/28/1965 (55 y.o. M) Treating RN: Levora Dredge Primary Care Physician: Fulton Reek Other Clinician: Referring Physician: Fulton Reek Treating Physician/Extender: Skipper Cliche in Treatment: 8 Education Assessment Education Provided To: Patient Education Topics Provided Medication Safety: Handouts: Other: pick up antibiotic Methods: Explain/Verbal Responses: State content correctly Pressure: Handouts: Pressure Ulcers: Care and Offloading Methods: Explain/Verbal Responses: State content correctly Wound/Skin Impairment: Handouts: Caring for Your Ulcer Methods: Explain/Verbal Responses: State content correctly Electronic Signature(s) Signed: 03/12/2022 4:15:12 PM By: Levora Dredge Entered By: Levora Dredge on 03/12/2022 13:36:38 [name] ([id number]) -------------------------------------------------------------------------------- Wound Assessment Details Patient Name: [name] Date of Service: 03/12/2022 11:30 AM Medical Record Number: [id number] Patient Account Number: [id number] Date of Birth/Sex: May 05, 1966 (55 y.o. M) Treating RN: Levora Dredge Primary Care Levis Nazir: Fulton Reek Other Clinician: Referring Trenae Brunke: Fulton Reek Treating Avari Gelles/Extender: Skipper Cliche in Treatment: 8 Wound Status Wound Number: 3 Primary Pressure Ulcer Etiology: Wound Location: Right Ischial Tuberosity Wound Open Wounding Event: Gradually Appeared Status: Date Acquired: 09/03/2021 Comorbid Cataracts, Glaucoma, Coronary Artery Disease, Weeks Of Treatment: 8 History: Hypertension, History of pressure wounds, Osteoarthritis, Clustered Wound: No Paraplegia Photos Wound Measurements Length: (cm) 2.5 Width: (cm) 5.5 Depth: (cm) 2 Area: (cm) 10.799 Volume: (cm) 21.598 % Reduction in Area: 38.6% % Reduction in Volume: 50.9% Epithelialization: None Tunneling: No Undermining: No Wound  Description Classification:  Category/Stage IV Exudate Amount: Large Exudate Type: Serosanguineous Exudate Color: red, brown Wound Bed Granulation Amount: Medium (34-66%) Exposed Structure Granulation Quality: Red, Pink Fat Layer (Subcutaneous Tissue) Exposed: Yes Necrotic Amount: Medium (34-66%) Necrotic Quality: Eschar, Adherent Slough Treatment Notes Wound #3 (Ischial Tuberosity) Wound Laterality: Right Cleanser [place] Ancillary Kit - 15 Day Supply Discharge Instruction: Use supplies as instructed; Kit contains: (15) Saline Bullets; (15) 3x3 Gauze; 15 pr Gloves Normal Saline Discharge Instruction: Wash your hands with soap and water. Remove old dressing, discard into plastic bag and place into trash. Cleanse the wound with Normal Saline prior to applying a clean dressing using gauze sponges, not tissues or cotton balls. Do not scrub or use excessive force. Pat dry using gauze sponges, not tissue or cotton balls. Wound Cleanser ZACHERIAH, STUMPE ([id number]) Discharge Instruction: Wash your hands with soap and water. Remove old dressing, discard into plastic bag and place into trash. Cleanse the wound with Wound Cleanser prior to applying a clean dressing using gauze sponges, not tissues or cotton balls. Do not scrub or use excessive force. Pat dry using gauze sponges, not tissue or cotton balls. Peri-Wound Care Topical Primary Dressing Gauze Discharge Instruction: saline Wet to Dry applied in the office until the wound VAC is replaced by home health Secondary Dressing ABD Pad 5x9 (in/in) Discharge Instruction: Cover with ABD pad Secured With [place] Soft Cloth Surgical Tape, 2x2 (in/yd) Compression Wrap Compression Stockings Add-Ons Electronic Signature(s) Signed: 03/12/2022 4:15:12 PM By: Levora Dredge Entered By: Levora Dredge on 03/12/2022 11:59:30 [name]  ([id number]) -------------------------------------------------------------------------------- Wound Assessment Details Patient Name: [name] Date of Service: 03/12/2022 11:30 AM Medical Record Number: [id number] Patient Account Number: [id number] Date of Birth/Sex: 1965/11/09 (55 y.o. M) Treating RN: Levora Dredge Primary Care Milbert Bixler: Fulton Reek Other Clinician: Referring Iya Hamed: Fulton Reek Treating Kelii Chittum/Extender: Skipper Cliche in Treatment: 8 Wound Status Wound Number: 4 Primary Pressure Ulcer Etiology: Wound Location: Left Ischial Tuberosity Wound Open Wounding Event: Gradually Appeared Status: Date Acquired: 01/29/2022 Comorbid Cataracts, Glaucoma, Coronary Artery Disease, Weeks Of Treatment: 5 History: Hypertension, History of pressure wounds, Osteoarthritis, Clustered Wound: No Paraplegia Photos Wound Measurements Length: (cm) 2 Width: (cm) 1 Depth: (cm) 1 Area: (cm) 1.571 Volume: (cm) 1.571 % Reduction in Area: -33.4% % Reduction in Volume: 52.4% Epithelialization: Small (1-33%) Tunneling: No Undermining: Yes Starting Position (o'clock): 6 Ending Position (o'clock): 11 Maximum Distance: (cm) 0.8 Wound Description Classification: Category/Stage IV Exudate Amount: Medium Exudate Type: Serosanguineous Exudate Color: red, brown Foul Odor After Cleansing: No Slough/Fibrino Yes Wound Bed Granulation Amount: Large (67-100%) Exposed Structure Granulation Quality: Red Fat Layer (Subcutaneous Tissue) Exposed: Yes Necrotic Amount: Small (1-33%) Necrotic Quality: Adherent Slough Treatment Notes Wound #4 (Ischial Tuberosity) Wound Laterality: Left Cleanser Normal Saline Discharge Instruction: Wash your hands with soap and water. Remove old dressing, discard into plastic bag and place into trash. Cleanse the wound with Normal Saline prior to applying a clean dressing using gauze sponges, not tissues or cotton balls. Do not Rupert,  [name] ([id number]) scrub or use excessive force. Pat dry using gauze sponges, not tissue or cotton balls. Wound Cleanser Discharge Instruction: Wash your hands with soap and water. Remove old dressing, discard into plastic bag and place into trash. Cleanse the wound with Wound Cleanser prior to applying a clean dressing using gauze sponges, not tissues or cotton balls. Do not scrub or use excessive force. Pat dry using gauze sponges, not tissue or cotton balls. Peri-Wound Care Topical Primary Dressing Hydrofera Blue  Ready Transfer Foam, 2.5x2.5 (in/in) Discharge Instruction: Apply Hydrofera Blue Ready to wound bed as directed Secondary Dressing (SILCONE BORDER) [place] Dressing 5x5 (in/in) Discharge Instruction: Please do not put silicone bordered dressings under wraps. Use non-bordered dressing only. Secured With Compression Wrap Compression Stockings Add-Ons Electronic Signature(s) Signed: 03/12/2022 4:15:12 PM By: Levora Dredge Entered By: Levora Dredge on 03/12/2022 12:00:13 [name] ([id number]) -------------------------------------------------------------------------------- Vitals Details Patient Name: [name] Date of Service: 03/12/2022 11:30 AM Medical Record Number: [id number] Patient Account Number: [id number] Date of Birth/Sex: 04-27-1966 (55 y.o. M) Treating RN: Levora Dredge Primary Care Jaclin Finks: Fulton Reek Other Clinician: Referring Izack Hoogland: Fulton Reek Treating Jehiel Koepp/Extender: Skipper Cliche in Treatment: 8 Vital Signs Time Taken: 11:45 Temperature (F): 97.9 Height (in): 60 Pulse (bpm): 93 Respiratory Rate (breaths/min): 18 Blood Pressure (mmHg): 107/73 Reference Range: 80 - 120 mg / dl Electronic Signature(s) Signed: 03/12/2022 4:15:12 PM By: Levora Dredge Entered By: Levora Dredge on 03/12/2022 11:46:53

## 2022-03-19 ENCOUNTER — Encounter: Payer: Medicare Other | Admitting: Physician Assistant

## 2022-03-19 DIAGNOSIS — L89314 Pressure ulcer of right buttock, stage 4: Secondary | ICD-10-CM | POA: Diagnosis not present

## 2022-03-19 NOTE — Progress Notes (Addendum)
CORRIE, BRANNEN ([id number]) Visit Report for 03/19/2022 Chief Complaint Document Details Patient Name: [name], [name] Date of Service: 03/19/2022 11:30 AM Medical Record Number: [id number] Patient Account Number: [id number] Date of Birth/Sex: March 26, 1966 (56 y.o. M) Treating RN: Levora Dredge Primary Care Provider: Fulton Reek Other Clinician: Referring Provider: Fulton Reek Treating Provider/Extender: Skipper Cliche in Treatment: 9 Information Obtained from: Patient Chief Complaint Right ischial tuberosity pressure ulcer Electronic Signature(s) Signed: 03/19/2022 11:37:08 AM By: Worthy Keeler PA-C Entered By: Worthy Keeler on 03/19/2022 11:37:08 [name] ([id number]) -------------------------------------------------------------------------------- HPI Details Patient Name: [name] Date of Service: 03/19/2022 11:30 AM Medical Record Number: [id number] Patient Account Number: [id number] Date of Birth/Sex: May 15, 1966 (55 y.o. M) Treating RN: Levora Dredge Primary Care Provider: Fulton Reek Other Clinician: Referring Provider: Fulton Reek Treating Provider/Extender: Skipper Cliche in Treatment: 9 History of Present Illness HPI Description: 56 year old male with a history of spina bifida presenting to [place] with a history of a open wound on the left gluteal region near his upper thigh which she's had for several weeks. He was seen in the ED recently at [place] [facility] and was put on doxycycline and was advised some local care. his past medical history is significant for spinal bifid, neurogenic bladder, kidney stones, sacral pressure sores, constipation. Past surgical history significant for partial cystectomy, ileal conduit, VP shunt removal, percutaneous nephro lithotripsy. In the remote past the patient says he's had some treatment for a ulcer on this area and was treated with a skin graft. Readmission: 10/16/2021 upon evaluation  today patient appears to be doing somewhat poorly in regard to a fairly large wound noted over the right ischial tuberosity location. This is a stage IV pressure ulcer. Of note this is also positive for osteomyelitis upon a CT scan which was performed during the time that he was admitted in the hospital on 09/19/2021. With that being said it is noted in the right inferior buttock that he has a decubitus ulcer which extends to the posterior toe inferior right ischium where there is evidence of osteomyelitis. When he was here in the office actually missed that this was positive I missed read it and thought it said that there was no osteomyelitis. That is not the case there is in fact osteomyelitis noted here. I actually did review his notes as well in epic. Looking to the discharge summary it appears that the patient was admitted from 09/19/2021 through 09/23/2021. He was discharged home with home health care according to the note. With that being said from what I understand his sister actually takes care of him at this point. He was also to follow-up with a primary care provider and here at the wound care center within a week. I am just now seeing him on the 13th almost a month after discharge. He does have a history of again osteomyelitis of this right hip/ischial tuberosity location. 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 The reason for his admission was actually worsening of the decubitus ulcer upon admission. He does have chronic osteomyelitis of this area. He was treated with empiric antibiotics he had acute kidney injury which improved with IV fluids he also had hypokalemia. Surgical debridement was performed by general surgery at that time and ID was consulted to assist with management according to the notes. IV antibiotics were recommended  but  patient declined and wanted oral antibiotics at discharge. Therefore no IV antibiotics were planned for discharge time. This case was under the care of Dr. Steva Ready while he was in the hospital when she did discharge him with a 2-week course of doxycycline and Augmentin according to the notes. It looks like Dr. Doy Hutching office did call and leave a message for the patient to get scheduled for a follow-up visit after hospital discharge I do not see where he showed up in fact it appears that the patient has a no- show letter from Dr. Doy Hutching office noted in epic due to a appointment that was missed on 10/06/2021. Looking at the pictures from Dr. Raelene Bott notes on 09/23/2021 it does appear that the wound is a little bit better as far as the cleanliness of the surface of the wound today but nonetheless still is a very significant wound. 10/26/2021 upon evaluation today patient appears to be doing okay in regard to his wound. Again this is a wound that he did indeed have osteomyelitis and previous. With that being said I do not see any signs of a worsening infection at this time which is good news. Overall I think that we are headed in the right direction although still I think he needs more aggressive offloading. 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 I do believe this would be ideal for him to be honest. Readmission: 01-15-2022 upon evaluation today patient presents for reevaluation he was last seen on October 26, 2021. At that time unfortunately he was having issues with a significant ischial pressure ulcer on the right ischial tuberosity. Subsequently he ended up having decent response to the Dakin's moistened gauze dressing that was previously being utilized and subsequently the patient's sister who is his primary caregiver as well states that she decided just to try to manage this at home. Nonetheless unfortunately this is getting a little larger not better. He does spend a  lot of the day in his chair. He does have an air mattress according what they tell me today. That was previously ordered. With that being said I do believe that at this time things do appear to be doing much better from the standpoint of infection I do not see any signs of overt infection. There is also no evidence of systemic infection. No fevers, chills, nausea, vomiting, or diarrhea. With that being said I do believe based on what I am seeing the wound is a bit cleaner and he possibly could be an excellent candidate for a wound VAC. 02-05-2022 upon evaluation today patient appears to be doing about the same in regard to his wound. Unfortunately he is not any better but he also really has not had the wound VAC on sufficiently. There is been some confusion with the orders part of that I think is our follow-up with the way the order was written part of it may be with how the wound VAC is being placed either way we need to see what we can do to try to improve things in this regard. We will clarify the orders today going forward for home health. Unfortunately the patient does have a new wound today which is on the opposite side. This is not good 1 was bad enough have another area to open is definitely not a good thing. Its not as deep but nonetheless is also not very shallow either. We may potentially be able to get this to the point that  we could bridge the 2 together in order to wound VAC both but right now want a focus on to try to get the wound VAC going for the main wound initially we will likely use Hydrofera Blue on the new wound.Marland Kitchen 02-19-2022 upon evaluation today patient's wound appears to be doing a little bit worse in the way of maceration fortunately there is no signs of active infection locally or systemically which is great news. No fevers, chills, nausea, vomiting, or diarrhea. 03-05-2022 upon evaluation today patient unfortunately does not appear to be doing nearly as good in regard to his  wounds as I would like to see. Fortunately I do not see any evidence of active infection that is obvious although I am can obtain a wound culture to ensure that there is not anything getting worse here as far as the wound without the wound VAC on his concern. With that being said I will go ahead and get this sent in and evaluated will initiate treatment as needed going forward if necessary. With regard to the wound with the wound VAC this continues to be placed directly over the wound and there is obvious signs of deep tissue injury. The suction being here means that he is sitting on it I think this NAREK, KNISS ([id number]) may be part of the reason why it is coming off although not 625% certain. 03-12-2022 upon evaluation today patient still has not gotten the antibiotics that were called in for him 4 days ago. He tells me that his sister just told him this morning that they were ready but that she has not had a chance to go pick them up. Nonetheless I feel like that this is really something he needs any should have been taken that not the other antibiotics which were not doing the job for him to be honest. The patient states and I completely agree that there is really no way he would have known relying on his sister to let him know obviously. Nonetheless I do believe that he needs to not be taking any other antibiotics and be on the Levaquin as soon as possible. He tells me that she said she was going to pick them up today. In regard to home health I am hopeful that they actually have been bridging this now. I do think that the wound looks better and this is good news. With that being said I am not certain that I can really tell for sure how this was attached it was removed before he came in and the black foam left in place. I really want him to leave the wound VAC in place until he comes in so that we can monitor and make sure that this is being done correctly he voiced understanding. 03-19-2022  upon evaluation today and much happier with how things appear as far as the patient's wounds are concerned. I think he is on a much better track and they are doing a better job at bridging although its not quite where I like to see it is still more posterior which I think the patient needs to be a little bit more lateral on his side. Either way this is something that I did marked with a small dot today for him to tell the home health nurses well so she can bridge it to that location also think the tubing should go up instead of going down to just make it less cumbersome overall for him. Electronic Signature(s) Signed: 03/19/2022 11:58:45 AM By: Joaquim Lai  III, Melek Pownall PA-C Entered By: Worthy Keeler on 03/19/2022 11:58:45 [name] ([id number]) -------------------------------------------------------------------------------- Physical Exam Details Patient Name: [name], [name] Date of Service: 03/19/2022 11:30 AM Medical Record Number: [id number] Patient Account Number: [id number] Date of Birth/Sex: Dec 09, 1965 (55 y.o. M) Treating RN: Levora Dredge Primary Care Provider: Fulton Reek Other Clinician: Referring Provider: Fulton Reek Treating Provider/Extender: Skipper Cliche in Treatment: 65 Constitutional Well-nourished and well-hydrated in no acute distress. Respiratory normal breathing without difficulty. Psychiatric this patient is able to make decisions and demonstrates good insight into disease process. Alert and Oriented x 3. pleasant and cooperative. Notes Upon inspection patient's wound bed actually showed signs of good granulation epithelization at this point. Fortunately I do not see any evidence of infection locally or systemically which is great news and overall I think you are on the right track here. Electronic Signature(s) Signed: 03/19/2022 11:58:58 AM By: Worthy Keeler PA-C Entered By: Worthy Keeler on 03/19/2022 11:58:57 [name]  ([id number]) -------------------------------------------------------------------------------- Physician Orders Details Patient Name: [name] Date of Service: 03/19/2022 11:30 AM Medical Record Number: [id number] Patient Account Number: [id number] Date of Birth/Sex: June 25, 1966 (55 y.o. M) Treating RN: Levora Dredge Primary Care Provider: Fulton Reek Other Clinician: Referring Provider: Fulton Reek Treating Provider/Extender: Skipper Cliche in Treatment: 9 Verbal / Phone Orders: No Diagnosis Coding ICD-10 Coding Code Description L89.314 Pressure ulcer of right buttock, stage 4 L89.323 Pressure ulcer of left buttock, stage 3 Q76.0 Spina bifida occulta Z93.3 Colostomy status I10 Essential (primary) hypertension I25.10 Atherosclerotic heart disease of native coronary artery without angina pectoris Follow-up Appointments o Return Appointment in 2 weeks. o Nurse Visit as needed [place]: - [facility] o ADMIT to [facility] for wound care. May utilize formulary equivalent dressing for wound treatment orders unless otherwise specified. Home Health Nurse may visit PRN to address patientos wound care needs. o Scheduled days for dressing changes to be completed; exception, patient has scheduled wound care visit that day. o **Please direct any NON-WOUND related issues/requests for orders to patient's Primary Care Physician. **If current dressing causes regression in wound condition, may D/C ordered dressing product/s and apply Normal Saline Moist Dressing daily until next [place] or Other MD appointment. **Notify [facility] of regression in wound condition at [phone]. o Other Home Health Orders/Instructions: - Black dot written on patients hip area where PA Stone would like wound vac bridged up to please. Bathing/ Shower/ Hygiene o Clean wound with Normal Saline or wound cleanser. - keep dressing dry or  change after shower o No tub bath. Off-Loading o Gel wheelchair cushion o Hospital bed/mattress - Has at home o Air fluidized (Group 3) - Has from [employer] and reposition every 2 hours - Do not sit in wheelchair any longer than you have to--offload wound in bed as much as possible Additional Orders / Instructions o Follow Nutritious Diet and Increase Protein Intake o Other: - Refer to ostomy clinic in [facility] if desired Negative Pressure Wound Therapy Wound #3 Right Ischial Tuberosity o Wound VAC settings at 142mHg continuous pressure. Use foam to wound cavity. Please order WHITE foam to fill any tunnel/s and/or undermining when necessary. Change VAC dressing 3 X WEEK. Change canister as indicated when full. - Change Monday, Wednesday, and Friday o Home Health Nurse may d/c VAC for s/s of increased infection, significant wound regression, or uncontrolled drainage. [place] [phone] o Number of foam/gauze pieces used in the dressing = -  1 piece of black foam place into the wound bed. This needs to be bridged to the side of the abdomen/hip area so that the patient will not be sitting on the suction pad and tubing when up in his chair. This will also help the suction pad to stay in place. o Other: - Please do not remove wound vac prior to wound clinic visit so we can assess application Medications-Please add to medication list. o P.O. Antibiotics - PLEASE pick up you called in antibiotic and start ASAP Wound Treatment STANISLAV, GERVASE ([id number]) Wound #3 - Ischial Tuberosity Wound Laterality: Right Cleanser: Byram Ancillary Kit - 15 Day Supply (Generic) 1 x Per Day/30 Days Discharge Instructions: Use supplies as instructed; Kit contains: (15) Saline Bullets; (15) 3x3 Gauze; 15 pr Gloves Cleanser: Normal Saline 1 x Per Day/30 Days Discharge Instructions: Wash your hands with soap and water. Remove old dressing, discard into plastic  bag and place into trash. Cleanse the wound with Normal Saline prior to applying a clean dressing using gauze sponges, not tissues or cotton balls. Do not scrub or use excessive force. Pat dry using gauze sponges, not tissue or cotton balls. Cleanser: Wound Cleanser 1 x Per Day/30 Days Discharge Instructions: Wash your hands with soap and water. Remove old dressing, discard into plastic bag and place into trash. Cleanse the wound with Wound Cleanser prior to applying a clean dressing using gauze sponges, not tissues or cotton balls. Do not scrub or use excessive force. Pat dry using gauze sponges, not tissue or cotton balls. Primary Dressing: Gauze (Generic) 1 x Per Day/30 Days Discharge Instructions: moistened with dakins Secondary Dressing: ABD Pad 5x9 (in/in) (Generic) 1 x Per Day/30 Days Discharge Instructions: Cover with ABD pad Secured With: Medipore Tape - 36M Medipore H Soft Cloth Surgical Tape, 2x2 (in/yd) (Generic) 1 x Per Day/30 Days Wound #4 - Ischial Tuberosity Wound Laterality: Left Cleanser: Normal Saline 3 x Per Week/30 Days Discharge Instructions: Wash your hands with soap and water. Remove old dressing, discard into plastic bag and place into trash. Cleanse the wound with Normal Saline prior to applying a clean dressing using gauze sponges, not tissues or cotton balls. Do not scrub or use excessive force. Pat dry using gauze sponges, not tissue or cotton balls. Cleanser: Wound Cleanser 3 x Per Week/30 Days Discharge Instructions: Wash your hands with soap and water. Remove old dressing, discard into plastic bag and place into trash. Cleanse the wound with Wound Cleanser prior to applying a clean dressing using gauze sponges, not tissues or cotton balls. Do not scrub or use excessive force. Pat dry using gauze sponges, not tissue or cotton balls. Primary Dressing: Hydrofera Blue Ready Transfer Foam, 2.5x2.5 (in/in) 3 x Per Week/30 Days Discharge Instructions: Apply Hydrofera  Blue Ready to wound bed as directed Secondary Dressing: (SILCONE BORDER) Zetuvit Plus SILICONE BORDER Dressing 5x5 (in/in) 3 x Per Week/30 Days Discharge Instructions: Please do not put silicone bordered dressings under wraps. Use non-bordered dressing only. Electronic Signature(s) Signed: 03/19/2022 2:49:43 PM By: Levora Dredge Signed: 03/19/2022 3:32:08 PM By: Worthy Keeler PA-C Entered By: Levora Dredge on 03/19/2022 12:10:43 [name] ([id number]) -------------------------------------------------------------------------------- Problem List Details Patient Name: [name] Date of Service: 03/19/2022 11:30 AM Medical Record Number: [id number] Patient Account Number: [id number] Date of Birth/Sex: 09-May-1966 (56 y.o. M) Treating RN: Levora Dredge Primary Care Provider: Fulton Reek Other Clinician: Referring Provider: Fulton Reek Treating Provider/Extender: Skipper Cliche in Treatment: 9 Active Problems ICD-10 Encounter Code Description Active Date  MDM Diagnosis L89.314 Pressure ulcer of right buttock, stage 4 01/15/2022 No Yes L89.323 Pressure ulcer of left buttock, stage 3 02/05/2022 No Yes Q76.0 Spina bifida occulta 01/15/2022 No Yes Z93.3 Colostomy status 01/15/2022 No Yes I10 Essential (primary) hypertension 01/15/2022 No Yes I25.10 Atherosclerotic heart disease of native coronary artery without angina 01/15/2022 No Yes pectoris Inactive Problems Resolved Problems Electronic Signature(s) Signed: 03/19/2022 11:37:05 AM By: Worthy Keeler PA-C Entered By: Worthy Keeler on 03/19/2022 11:37:04 [name] ([id number]) -------------------------------------------------------------------------------- Progress Note Details Patient Name: [name] Date of Service: 03/19/2022 11:30 AM Medical Record Number: [id number] Patient Account Number: [id number] Date of Birth/Sex: 08/30/1966 (55 y.o. M) Treating RN: Levora Dredge Primary Care  Provider: Fulton Reek Other Clinician: Referring Provider: Fulton Reek Treating Provider/Extender: Skipper Cliche in Treatment: 9 Subjective Chief Complaint Information obtained from Patient Right ischial tuberosity pressure ulcer History of Present Illness (HPI) 56 year old male with a history of spina bifida presenting to [place] with a history of a open wound on the left gluteal region near his upper thigh which she's had for several weeks. He was seen in the ED recently at [facility] [place] [facility] and was put on doxycycline and was advised some local care. his past medical history is significant for spinal bifid, neurogenic bladder, kidney stones, sacral pressure sores, constipation. Past surgical history significant for partial cystectomy, ileal conduit, VP shunt removal, percutaneous nephro lithotripsy. In the remote past the patient says he's had some treatment for a ulcer on this area and was treated with a skin graft. Readmission: 10/16/2021 upon evaluation today patient appears to be doing somewhat poorly in regard to a fairly large wound noted over the right ischial tuberosity location. This is a stage IV pressure ulcer. Of note this is also positive for osteomyelitis upon a CT scan which was performed during the time that he was admitted in the hospital on 09/19/2021. With that being said it is noted in the right inferior buttock that he has a decubitus ulcer which extends to the posterior toe inferior right ischium where there is evidence of osteomyelitis. When he was here in the office actually missed that this was positive I missed read it and thought it said that there was no osteomyelitis. That is not the case there is in fact osteomyelitis noted here. I actually did review his notes as well in epic. Looking to the discharge summary it appears that the patient was admitted from 09/19/2021 through 09/23/2021. He was discharged home with home health care according to the note.  With that being said from what I understand his sister actually takes care of him at this point. He was also to follow-up with a primary care provider and here at the wound care center within a week. I am just now seeing him on the 13th almost a month after discharge. He does have a history of again osteomyelitis of this right hip/ischial tuberosity location. 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 The reason for his admission was actually worsening of the decubitus ulcer upon admission. He does have chronic osteomyelitis of this area. He was treated with empiric antibiotics he had acute kidney injury which improved with IV fluids he also had hypokalemia. Surgical debridement was performed by general surgery at that time and ID was consulted to assist with management according to the  notes. IV antibiotics were recommended but patient declined and wanted oral antibiotics at discharge. Therefore no IV antibiotics were planned for discharge time. This case was under the care of Dr. Steva Ready while he was in the hospital when she did discharge him with a 2-week course of doxycycline and Augmentin according to the notes. It looks like Dr. Doy Hutching office did call and leave a message for the patient to get scheduled for a follow-up visit after hospital discharge I do not see where he showed up in fact it appears that the patient has a no- show letter from Dr. Doy Hutching office noted in epic due to a appointment that was missed on 10/06/2021. Looking at the pictures from Dr. Raelene Bott notes on 09/23/2021 it does appear that the wound is a little bit better as far as the cleanliness of the surface of the wound today but nonetheless still is a very significant wound. 10/26/2021 upon evaluation today patient appears to be doing okay in regard to his wound. Again this  is a wound that he did indeed have osteomyelitis and previous. With that being said I do not see any signs of a worsening infection at this time which is good news. Overall I think that we are headed in the right direction although still I think he needs more aggressive offloading. 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 I do believe this would be ideal for him to be honest. Readmission: 01-15-2022 upon evaluation today patient presents for reevaluation he was last seen on October 26, 2021. At that time unfortunately he was having issues with a significant ischial pressure ulcer on the right ischial tuberosity. Subsequently he ended up having decent response to the Dakin's moistened gauze dressing that was previously being utilized and subsequently the patient's sister who is his primary caregiver as well states that she decided just to try to manage this at home. Nonetheless unfortunately this is getting a little larger not better. He does spend a lot of the day in his chair. He does have an air mattress according what they tell me today. That was previously ordered. With that being said I do believe that at this time things do appear to be doing much better from the standpoint of infection I do not see any signs of overt infection. There is also no evidence of systemic infection. No fevers, chills, nausea, vomiting, or diarrhea. With that being said I do believe based on what I am seeing the wound is a bit cleaner and he possibly could be an excellent candidate for a wound VAC. 02-05-2022 upon evaluation today patient appears to be doing about the same in regard to his wound. Unfortunately he is not any better but he also really has not had the wound VAC on sufficiently. There is been some confusion with the orders part of that I think is our follow-up with the way the order was written part of it may be with how the wound VAC is being placed either way we need to see what we  can do to try to improve things in this regard. We will clarify the orders today going forward for home health. Unfortunately the patient does have a new wound today which is on the opposite side. This is not good 1 was bad enough have another area to open is definitely not a good thing. Its not as deep but nonetheless is also not very shallow either. We may potentially be able to get  this to the point that we could bridge the 2 together in order to wound VAC both but right now want a focus on to try to get the wound VAC going for the main wound initially we will likely use Hydrofera Blue on the new wound.Marland Kitchen 02-19-2022 upon evaluation today patient's wound appears to be doing a little bit worse in the way of maceration fortunately there is no signs of active infection locally or systemically which is great news. No fevers, chills, nausea, vomiting, or diarrhea. STEWART, SASAKI ([id number]) 03-05-2022 upon evaluation today patient unfortunately does not appear to be doing nearly as good in regard to his wounds as I would like to see. Fortunately I do not see any evidence of active infection that is obvious although I am can obtain a wound culture to ensure that there is not anything getting worse here as far as the wound without the wound VAC on his concern. With that being said I will go ahead and get this sent in and evaluated will initiate treatment as needed going forward if necessary. With regard to the wound with the wound VAC this continues to be placed directly over the wound and there is obvious signs of deep tissue injury. The suction being here means that he is sitting on it I think this may be part of the reason why it is coming off although not 683% certain. 03-12-2022 upon evaluation today patient still has not gotten the antibiotics that were called in for him 4 days ago. He tells me that his sister just told him this morning that they were ready but that she has not had a chance to go pick  them up. Nonetheless I feel like that this is really something he needs any should have been taken that not the other antibiotics which were not doing the job for him to be honest. The patient states and I completely agree that there is really no way he would have known relying on his sister to let him know obviously. Nonetheless I do believe that he needs to not be taking any other antibiotics and be on the Levaquin as soon as possible. He tells me that she said she was going to pick them up today. In regard to home health I am hopeful that they actually have been bridging this now. I do think that the wound looks better and this is good news. With that being said I am not certain that I can really tell for sure how this was attached it was removed before he came in and the black foam left in place. I really want him to leave the wound VAC in place until he comes in so that we can monitor and make sure that this is being done correctly he voiced understanding. 03-19-2022 upon evaluation today and much happier with how things appear as far as the patient's wounds are concerned. I think he is on a much better track and they are doing a better job at bridging although its not quite where I like to see it is still more posterior which I think the patient needs to be a little bit more lateral on his side. Either way this is something that I did marked with a small dot today for him to tell the home health nurses well so she can bridge it to that location also think the tubing should go up instead of going down to just make it less cumbersome overall for him. Objective Constitutional Well-nourished and  well-hydrated in no acute distress. Vitals Time Taken: 11:30 AM, Height: 60 in, Temperature: 97.6 F, Pulse: 92 bpm, Respiratory Rate: 18 breaths/min, Blood Pressure: 113/71 mmHg. Respiratory normal breathing without difficulty. Psychiatric this patient is able to make decisions and demonstrates good  insight into disease process. Alert and Oriented x 3. pleasant and cooperative. General Notes: Upon inspection patient's wound bed actually showed signs of good granulation epithelization at this point. Fortunately I do not see any evidence of infection locally or systemically which is great news and overall I think you are on the right track here. Integumentary (Hair, Skin) Wound #3 status is Open. Original cause of wound was Gradually Appeared. The date acquired was: 09/03/2021. The wound has been in treatment 9 weeks. The wound is located on the Right Ischial Tuberosity. The wound measures 2.6cm length x 6cm width x 1.1cm depth; 12.252cm^2 area and 13.477cm^3 volume. There is Fat Layer (Subcutaneous Tissue) exposed. There is no tunneling or undermining noted. There is a large amount of serosanguineous drainage noted. There is large (67-100%) red, pink granulation within the wound bed. There is a small (1-33%) amount of necrotic tissue within the wound bed including Eschar and Adherent Slough. Wound #4 status is Open. Original cause of wound was Gradually Appeared. The date acquired was: 01/29/2022. The wound has been in treatment 6 weeks. The wound is located on the Left Ischial Tuberosity. The wound measures 1.5cm length x 1cm width x 1cm depth; 1.178cm^2 area and 1.178cm^3 volume. There is Fat Layer (Subcutaneous Tissue) exposed. There is no tunneling or undermining noted. There is a medium amount of serosanguineous drainage noted. There is large (67-100%) red granulation within the wound bed. There is a small (1-33%) amount of necrotic tissue within the wound bed including Adherent Slough. Assessment Active Problems ICD-10 Pressure ulcer of right buttock, stage 4 Pressure ulcer of left buttock, stage 3 Spina bifida occulta Colostomy status Essential (primary) hypertension Atherosclerotic heart disease of native coronary artery without angina pectoris JOTHAM, AHN  ([id number]) Plan Follow-up Appointments: Return Appointment in 2 weeks. Nurse Visit as needed Home Health: [facility]: - [facility] ADMIT to [place] for wound care. May utilize formulary equivalent dressing for wound treatment orders unless otherwise specified. Home Health Nurse may visit PRN to address patient s wound care needs. Scheduled days for dressing changes to be completed; exception, patient has scheduled wound care visit that day. **Please direct any NON-WOUND related issues/requests for orders to patient's Primary Care Physician. **If current dressing causes regression in wound condition, may D/C ordered dressing product/s and apply Normal Saline Moist Dressing daily until next [facility] or Other MD appointment. **Notify [facility] of regression in wound condition at [phone]. Bathing/ Shower/ Hygiene: Clean wound with Normal Saline or wound cleanser. - keep dressing dry or change after shower No tub bath. Off-Loading: Gel wheelchair cushion Hospital bed/mattress - Has at home Air fluidized (Group 3) - Has from [employer] and reposition every 2 hours - Do not sit in wheelchair any longer than you have to--offload wound in bed as much as possible Additional Orders / Instructions: Follow Nutritious Diet and Increase Protein Intake Other: - Refer to ostomy clinic in [facility] if desired Negative Pressure Wound Therapy: Wound #3 Right Ischial Tuberosity: Wound VAC settings at 183mHg continuous pressure. Use foam to wound cavity. Please order WHITE foam to fill any tunnel/s and/or undermining when necessary. Change VAC dressing 3 X WEEK. Change canister as indicated when full. - Change Monday, Wednesday,  and Friday Home Health Nurse may d/c VAC for s/s of increased infection, significant wound regression, or uncontrolled drainage. [facility] at [phone]. Number of foam/gauze pieces used in the dressing = - 1 piece of  black foam place into the wound bed. This needs to be bridged to the side of the abdomen/hip area so that the patient will not be sitting on the suction pad and tubing when up in his chair. This will also help the suction pad to stay in place. Other: - Please do not remove wound vac prior to wound clinic visit so we can assess application Medications-Please add to medication list.: P.O. Antibiotics - PLEASE pick up you called in antibiotic and start ASAP WOUND #3: - Ischial Tuberosity Wound Laterality: Right Cleanser: [place]ram Ancillary Kit - 15 Day Supply (Generic) 1 x Per Day/30 Days Discharge Instructions: Use supplies as instructed; Kit contains: (15) Saline Bullets; (15) 3x3 Gauze; 15 pr Gloves Cleanser: Normal Saline 1 x Per Day/30 Days Discharge Instructions: Wash your hands with soap and water. Remove old dressing, discard into plastic bag and place into trash. Cleanse the wound with Normal Saline prior to applying a clean dressing using gauze sponges, not tissues or cotton balls. Do not scrub or use excessive force. Pat dry using gauze sponges, not tissue or cotton balls. Cleanser: Wound Cleanser 1 x Per Day/30 Days Discharge Instructions: Wash your hands with soap and water. Remove old dressing, discard into plastic bag and place into trash. Cleanse the wound with Wound Cleanser prior to applying a clean dressing using gauze sponges, not tissues or cotton balls. Do not scrub or use excessive force. Pat dry using gauze sponges, not tissue or cotton balls. Primary Dressing: Gauze (Generic) 1 x Per Day/30 Days Discharge Instructions: moistened with dakins Secondary Dressing: ABD Pad 5x9 (in/in) (Generic) 1 x Per Day/30 Days Discharge Instructions: Cover with ABD pad Secured With: Medipore Tape - 81M Medipore H Soft Cloth Surgical Tape, 2x2 (in/yd) (Generic) 1 x Per Day/30 Days WOUND #4: - Ischial Tuberosity Wound Laterality: Left Cleanser: Normal Saline 3 x Per Week/30 Days Discharge  Instructions: Wash your hands with soap and water. Remove old dressing, discard into plastic bag and place into trash. Cleanse the wound with Normal Saline prior to applying a clean dressing using gauze sponges, not tissues or cotton balls. Do not scrub or use excessive force. Pat dry using gauze sponges, not tissue or cotton balls. Cleanser: Wound Cleanser 3 x Per Week/30 Days Discharge Instructions: Wash your hands with soap and water. Remove old dressing, discard into plastic bag and place into trash. Cleanse the wound with Wound Cleanser prior to applying a clean dressing using gauze sponges, not tissues or cotton balls. Do not scrub or use excessive force. Pat dry using gauze sponges, not tissue or cotton balls. Primary Dressing: Hydrofera Blue Ready Transfer Foam, 2.5x2.5 (in/in) 3 x Per Week/30 Days Discharge Instructions: Apply Hydrofera Blue Ready to wound bed as directed Secondary Dressing: (SILCONE BORDER) Zetuvit Plus SILICONE BORDER Dressing 5x5 (in/in) 3 x Per Week/30 Days Discharge Instructions: Please do not put silicone bordered dressings under wraps. Use non-bordered dressing only. 1. I would recommend that we continue with the wound VAC for the biggest wound I think that still doing well. 2. For the other side we will get a continue with the [facility]. Damon, [name] ([id number]) 3. The patient tells me that he is staying in the bed pretty much all the time now to keep pressure off  I think he is doing the right thing and obviously is showing and how good the wounds are doing. We will see patient back for reevaluation in 1 week here in the clinic. If anything worsens or changes patient will contact our office for additional recommendations. Electronic Signature(s) Signed: 03/19/2022 11:59:26 AM By: Worthy Keeler PA-C Entered By: Worthy Keeler on 03/19/2022 11:59:25 [name]  ([id number]) -------------------------------------------------------------------------------- SuperBill Details Patient Name: [name] Date of Service: 03/19/2022 Medical Record Number: [id number] Patient Account Number: [id number] Date of Birth/Sex: 1966/05/18 (55 y.o. M) Treating RN: Levora Dredge Primary Care Provider: Fulton Reek Other Clinician: Referring Provider: Fulton Reek Treating Provider/Extender: Skipper Cliche in Treatment: 9 Diagnosis Coding ICD-10 Codes Code Description L89.314 Pressure ulcer of right buttock, stage 4 L89.323 Pressure ulcer of left buttock, stage 3 Q76.0 Spina bifida occulta Z93.3 Colostomy status I10 Essential (primary) hypertension I25.10 Atherosclerotic heart disease of native coronary artery without angina pectoris Facility Procedures CPT4 Code: 88502774 Description: 99213 - WOUND CARE VISIT-LEV 3 EST PT Modifier: Quantity: 1 Physician Procedures CPT4 Code: 1287867 Description: 99214 - WC PHYS LEVEL 4 - EST PT Modifier: Quantity: 1 CPT4 Code: Description: ICD-10 Diagnosis Description L89.314 Pressure ulcer of right buttock, stage 4 L89.323 Pressure ulcer of left buttock, stage 3 Q76.0 Spina bifida occulta Z93.3 Colostomy status Modifier: Quantity: Electronic Signature(s) Signed: 03/19/2022 2:49:43 PM By: Levora Dredge Signed: 03/19/2022 3:32:08 PM By: Worthy Keeler PA-C Previous Signature: 03/19/2022 11:59:44 AM Version By: Worthy Keeler PA-C Entered By: Levora Dredge on 03/19/2022 12:11:44

## 2022-03-19 NOTE — Progress Notes (Addendum)
[name], [name] ([id number]) Visit Report for 03/19/2022 Arrival Information Details Patient Name: [name], [name] Date of Service: 03/19/2022 11:30 AM Medical Record Number: [id number] Patient Account Number: [id number] Date of Birth/Sex: 14-Jan-1966 (56 y.o. M) Treating RN: Levora Dredge Primary Care Keely Drennan: Fulton Reek Other Clinician: Referring Nashla Althoff: Fulton Reek Treating Arelyn Gauer/Extender: Skipper Cliche in Treatment: 9 Visit Information History Since Last Visit Added or deleted any medications: No Patient Arrived: Wheel Chair Any new allergies or adverse reactions: No Arrival Time: 11:41 Had a fall or experienced change in No Accompanied By: self activities of daily living that may affect Transfer Assistance: Transfer Board risk of falls: Patient Identification Verified: Yes Hospitalized since last visit: No Secondary Verification Process Completed: Yes Has Dressing in Place as Prescribed: Yes Patient Requires Transmission-Based Precautions: No Pain Present Now: No Patient Has Alerts: Yes Patient Alerts: NOT diabetic Electronic Signature(s) Signed: 03/19/2022 2:49:43 PM By: Levora Dredge Entered By: Levora Dredge on 03/19/2022 11:42:13 [name] ([id number]) -------------------------------------------------------------------------------- Clinic Level of Care Assessment Details Patient Name: [name] Date of Service: 03/19/2022 11:30 AM Medical Record Number: [id number] Patient Account Number: [id number] Date of Birth/Sex: July 08, 1966 (55 y.o. M) Treating RN: Levora Dredge Primary Care Rima Blizzard: Fulton Reek Other Clinician: Referring Clemie General: Fulton Reek Treating Leilanie Rauda/Extender: Skipper Cliche in Treatment: 9 Clinic Level of Care Assessment Items TOOL 4 Quantity Score '[]'  - Use when only an EandM is performed on FOLLOW-UP visit 0 ASSESSMENTS - Nursing Assessment / Reassessment X - Reassessment of Co-morbidities  (includes updates in patient status) 1 10 X- 1 5 Reassessment of Adherence to Treatment Plan ASSESSMENTS - Wound and Skin Assessment / Reassessment '[]'  - Simple Wound Assessment / Reassessment - one wound 0 X- 2 5 Complex Wound Assessment / Reassessment - multiple wounds '[]'  - 0 Dermatologic / Skin Assessment (not related to wound area) ASSESSMENTS - Focused Assessment '[]'  - Circumferential Edema Measurements - multi extremities 0 '[]'  - 0 Nutritional Assessment / Counseling / Intervention '[]'  - 0 Lower Extremity Assessment (monofilament, tuning fork, pulses) '[]'  - 0 Peripheral Arterial Disease Assessment (using hand held doppler) ASSESSMENTS - Ostomy and/or Continence Assessment and Care '[]'  - Incontinence Assessment and Management 0 '[]'  - 0 Ostomy Care Assessment and Management (repouching, etc.) PROCESS - Coordination of Care X - Simple Patient / Family Education for ongoing care 1 15 '[]'  - 0 Complex (extensive) Patient / Family Education for ongoing care '[]'  - 0 Staff obtains Programmer, systems, Records, Test Results / Process Orders '[]'  - 0 Staff telephones HHA, Nursing Homes / Clarify orders / etc '[]'  - 0 Routine Transfer to another Facility (non-emergent condition) '[]'  - 0 Routine Hospital Admission (non-emergent condition) '[]'  - 0 New Admissions / Biomedical engineer / Ordering NPWT, Apligraf, etc. '[]'  - 0 Emergency Hospital Admission (emergent condition) X- 1 10 Simple Discharge Coordination '[]'  - 0 Complex (extensive) Discharge Coordination PROCESS - Special Needs '[]'  - Pediatric / Minor Patient Management 0 '[]'  - 0 Isolation Patient Management '[]'  - 0 Hearing / Language / Visual special needs '[]'  - 0 Assessment of Community assistance (transportation, D/C planning, etc.) '[]'  - 0 Additional assistance / Altered mentation '[]'  - 0 Support Surface(s) Assessment (bed, cushion, seat, etc.) INTERVENTIONS - Wound Cleansing / Measurement [name], [name] ([id number]) '[]'  - 0 Simple Wound  Cleansing - one wound X- 2 5 Complex Wound Cleansing - multiple wounds X- 1 5 Wound Imaging (photographs - any number of wounds) '[]'  - 0 Wound Tracing (instead of photographs) '[]'  - 0 Simple Wound Measurement - one  wound X- 2 5 Complex Wound Measurement - multiple wounds INTERVENTIONS - Wound Dressings X - Small Wound Dressing one or multiple wounds 2 10 '[]'  - 0 Medium Wound Dressing one or multiple wounds '[]'  - 0 Large Wound Dressing one or multiple wounds '[]'  - 0 Application of Medications - topical '[]'  - 0 Application of Medications - injection INTERVENTIONS - Miscellaneous '[]'  - External ear exam 0 '[]'  - 0 Specimen Collection (cultures, biopsies, blood, body fluids, etc.) '[]'  - 0 Specimen(s) / Culture(s) sent or taken to Lab for analysis X- 1 10 Patient Transfer (multiple staff / Harrel Lemon Lift / Similar devices) '[]'  - 0 Simple Staple / Suture removal (25 or less) '[]'  - 0 Complex Staple / Suture removal (26 or more) '[]'  - 0 Hypo / Hyperglycemic Management (close monitor of Blood Glucose) '[]'  - 0 Ankle / Brachial Index (ABI) - do not check if billed separately X- 1 5 Vital Signs Has the patient been seen at the hospital within the last three years: Yes Total Score: 110 Level Of Care: New/Established - Level 3 Electronic Signature(s) Signed: 03/19/2022 2:49:43 PM By: Levora Dredge Entered By: Levora Dredge on 03/19/2022 12:11:36 [name] ([id number]) -------------------------------------------------------------------------------- Encounter Discharge Information Details Patient Name: [name] Date of Service: 03/19/2022 11:30 AM Medical Record Number: [id number] Patient Account Number: [id number] Date of Birth/Sex: 12/23/1965 (55 y.o. M) Treating RN: Levora Dredge Primary Care Anisten Tomassi: Fulton Reek Other Clinician: Referring Fabianna Keats: Fulton Reek Treating Jeanita Carneiro/Extender: Skipper Cliche in Treatment: 9 Encounter Discharge Information  Items Discharge Condition: Stable Ambulatory Status: Wheelchair Discharge Destination: Home Transportation: Other Accompanied By: self Schedule Follow-up Appointment: Yes Clinical Summary of Care: Electronic Signature(s) Signed: 03/19/2022 2:49:43 PM By: Levora Dredge Entered By: Levora Dredge on 03/19/2022 12:12:24 [name] ([id number]) -------------------------------------------------------------------------------- Lower Extremity Assessment Details Patient Name: [name] Date of Service: 03/19/2022 11:30 AM Medical Record Number: [id number] Patient Account Number: [id number] Date of Birth/Sex: 22-Nov-1965 (55 y.o. M) Treating RN: Levora Dredge Primary Care Nobuko Gsell: Fulton Reek Other Clinician: Referring Aishi Courts: Fulton Reek Treating Jyllian Haynie/Extender: Skipper Cliche in Treatment: 9 Electronic Signature(s) Signed: 03/19/2022 2:49:43 PM By: Levora Dredge Entered By: Levora Dredge on 03/19/2022 11:43:59 [name] ([id number]) -------------------------------------------------------------------------------- Multi Wound Chart Details Patient Name: [name] Date of Service: 03/19/2022 11:30 AM Medical Record Number: [id number] Patient Account Number: [id number] Date of Birth/Sex: 03-05-1966 (55 y.o. M) Treating RN: Levora Dredge Primary Care Tytan Sandate: Fulton Reek Other Clinician: Referring Ashton Sabine: Fulton Reek Treating Alea Ryer/Extender: Skipper Cliche in Treatment: 9 Vital Signs Height(in): 60 Pulse(bpm): 5 Weight(lbs): Blood Pressure(mmHg): 113/71 Body Mass Index(BMI): Temperature(F): 97.6 Respiratory Rate(breaths/min): 18 Photos: [N/A:N/A] Wound Location: Right Ischial Tuberosity Left Ischial Tuberosity N/A Wounding Event: Gradually Appeared Gradually Appeared N/A Primary Etiology: Pressure Ulcer Pressure Ulcer N/A Comorbid History: Cataracts, Glaucoma, Coronary Cataracts, Glaucoma, Coronary N/A Artery  Disease, Hypertension, Artery Disease, Hypertension, History of pressure wounds, History of pressure wounds, Osteoarthritis, Paraplegia Osteoarthritis, Paraplegia Date Acquired: 09/03/2021 01/29/2022 N/A Weeks of Treatment: 9 6 N/A Wound Status: Open Open N/A Wound Recurrence: No No N/A Measurements L x W x D (cm) 2.6x6x1.1 1.5x1x1 N/A Area (cm) : 12.252 1.178 N/A Volume (cm) : 13.477 1.178 N/A % Reduction in Area: 30.40% 0.00% N/A % Reduction in Volume: 69.40% 64.30% N/A Classification: Category/Stage IV Category/Stage IV N/A Exudate Amount: Large Medium N/A Exudate Type: Serosanguineous Serosanguineous N/A Exudate Color: red, brown red, brown N/A Granulation Amount: Large (67-100%) Large (67-100%) N/A Granulation Quality: Red, Pink Red N/A Necrotic Amount: Small (1-33%) Small (1-33%) N/A Necrotic  Tissue: Eschar, Adherent [employer] N/A Exposed Structures: Fat Layer (Subcutaneous Tissue): Fat Layer (Subcutaneous Tissue): N/A Yes Yes Epithelialization: None Small (1-33%) N/A Treatment Notes Electronic Signature(s) Signed: 03/19/2022 2:49:43 PM By: Levora Dredge Entered By: Levora Dredge on 03/19/2022 12:09:53 [name] ([id number]) -------------------------------------------------------------------------------- Ione Details Patient Name: [name] Date of Service: 03/19/2022 11:30 AM Medical Record Number: [id number] Patient Account Number: [id number] Date of Birth/Sex: May 28, 1966 (55 y.o. M) Treating RN: Levora Dredge Primary Care Marven Veley: Fulton Reek Other Clinician: Referring Keiasia Christianson: Fulton Reek Treating Angelli Baruch/Extender: Skipper Cliche in Treatment: 9 Active Inactive Pressure Nursing Diagnoses: Knowledge deficit related to causes and risk factors for pressure ulcer development Knowledge deficit related to management of pressures ulcers Potential for impaired tissue integrity related to pressure,  friction, moisture, and shear Goals: Patient will remain free from development of additional pressure ulcers Date Initiated: 01/15/2022 Target Resolution Date: 02/26/2022 Goal Status: Active Patient/caregiver will verbalize risk factors for pressure ulcer development Date Initiated: 01/15/2022 Target Resolution Date: 03/05/2022 Goal Status: Active Interventions: Assess: immobility, friction, shearing, incontinence upon admission and as needed Assess offloading mechanisms upon admission and as needed Assess potential for pressure ulcer upon admission and as needed Notes: Electronic Signature(s) Signed: 03/19/2022 2:49:43 PM By: Levora Dredge Entered By: Levora Dredge on 03/19/2022 12:09:46 [name] ([id number]) -------------------------------------------------------------------------------- Pain Assessment Details Patient Name: [name] Date of Service: 03/19/2022 11:30 AM Medical Record Number: [id number] Patient Account Number: [id number] Date of Birth/Sex: 1966-01-26 (55 y.o. M) Treating RN: Levora Dredge Primary Care Jamoni Hewes: Fulton Reek Other Clinician: Referring Kirti Carl: Fulton Reek Treating Karmen Altamirano/Extender: Skipper Cliche in Treatment: 9 Active Problems Location of Pain Severity and Description of Pain Patient Has Paino No Site Locations Rate the pain. Current Pain Level: 0 Pain Management and Medication Current Pain Management: Electronic Signature(s) Signed: 03/19/2022 2:49:43 PM By: Levora Dredge Entered By: Levora Dredge on 03/19/2022 11:42:42 [name] ([id number]) -------------------------------------------------------------------------------- Patient/Caregiver Education Details Patient Name: [name] Date of Service: 03/19/2022 11:30 AM Medical Record Number: [id number] Patient Account Number: [id number] Date of Birth/Gender: 27-Jun-1966 (55 y.o. M) Treating RN: Levora Dredge Primary Care Physician: Fulton Reek Other Clinician: Referring Physician: Fulton Reek Treating Physician/Extender: Skipper Cliche in Treatment: 9 Education Assessment Education Provided To: Patient Education Topics Provided Wound/Skin Impairment: Handouts: Caring for Your Ulcer Methods: Explain/Verbal Responses: State content correctly Electronic Signature(s) Signed: 03/19/2022 2:49:43 PM By: Levora Dredge Entered By: Levora Dredge on 03/19/2022 12:11:54 [name] ([id number]) -------------------------------------------------------------------------------- Wound Assessment Details Patient Name: [name] Date of Service: 03/19/2022 11:30 AM Medical Record Number: [id number] Patient Account Number: [id number] Date of Birth/Sex: 1966/08/21 (55 y.o. M) Treating RN: Levora Dredge Primary Care Latravia Southgate: Fulton Reek Other Clinician: Referring Victorino Fatzinger: Fulton Reek Treating Corky Blumstein/Extender: Skipper Cliche in Treatment: 9 Wound Status Wound Number: 3 Primary Pressure Ulcer Etiology: Wound Location: Right Ischial Tuberosity Wound Open Wounding Event: Gradually Appeared Status: Date Acquired: 09/03/2021 Comorbid Cataracts, Glaucoma, Coronary Artery Disease, Weeks Of Treatment: 9 History: Hypertension, History of pressure wounds, Osteoarthritis, Clustered Wound: No Paraplegia Photos Wound Measurements Length: (cm) 2.6 Width: (cm) 6 Depth: (cm) 1.1 Area: (cm) 12.252 Volume: (cm) 13.477 % Reduction in Area: 30.4% % Reduction in Volume: 69.4% Epithelialization: None Tunneling: No Undermining: No Wound Description Classification: Category/Stage IV Exudate Amount: Large Exudate Type: Serosanguineous Exudate Color: red, brown Wound Bed Granulation Amount: Large (67-100%) Exposed Structure Granulation Quality: Red, Pink Fat Layer (Subcutaneous Tissue) Exposed: Yes Necrotic Amount: Small (1-33%) Necrotic Quality: Eschar, Adherent Slough Treatment Notes Wound #3  (Ischial  Tuberosity) Wound Laterality: Right Cleanser [place] Ancillary Kit - 15 Day Supply Discharge Instruction: Use supplies as instructed; Kit contains: (15) Saline Bullets; (15) 3x3 Gauze; 15 pr Gloves Normal Saline Discharge Instruction: Wash your hands with soap and water. Remove old dressing, discard into plastic bag and place into trash. Cleanse the wound with Normal Saline prior to applying a clean dressing using gauze sponges, not tissues or cotton balls. Do not scrub or use excessive force. Pat dry using gauze sponges, not tissue or cotton balls. Wound Cleanser [name], [name] ([id number]) Discharge Instruction: Wash your hands with soap and water. Remove old dressing, discard into plastic bag and place into trash. Cleanse the wound with Wound Cleanser prior to applying a clean dressing using gauze sponges, not tissues or cotton balls. Do not scrub or use excessive force. Pat dry using gauze sponges, not tissue or cotton balls. Peri-Wound Care Topical Primary Dressing Gauze Discharge Instruction: moistened with dakins Secondary Dressing ABD Pad 5x9 (in/in) Discharge Instruction: Cover with ABD pad Secured With [place] H Soft Cloth Surgical Tape, 2x2 (in/yd) Compression Wrap Compression Stockings Add-Ons Electronic Signature(s) Signed: 03/19/2022 2:49:43 PM By: Levora Dredge Entered By: Levora Dredge on 03/19/2022 11:43:15 [name] ([id number]) -------------------------------------------------------------------------------- Wound Assessment Details Patient Name: [name] Date of Service: 03/19/2022 11:30 AM Medical Record Number: [id number] Patient Account Number: [id number] Date of Birth/Sex: 09-18-1966 (56 y.o. M) Treating RN: Levora Dredge Primary Care Earland Reish: Fulton Reek Other Clinician: Referring Haru Shaff: Fulton Reek Treating Linnette Panella/Extender: Skipper Cliche in Treatment: 9 Wound Status Wound Number: 4  Primary Pressure Ulcer Etiology: Wound Location: Left Ischial Tuberosity Wound Open Wounding Event: Gradually Appeared Status: Date Acquired: 01/29/2022 Comorbid Cataracts, Glaucoma, Coronary Artery Disease, Weeks Of Treatment: 6 History: Hypertension, History of pressure wounds, Osteoarthritis, Clustered Wound: No Paraplegia Photos Wound Measurements Length: (cm) 1.5 Width: (cm) 1 Depth: (cm) 1 Area: (cm) 1.178 Volume: (cm) 1.178 % Reduction in Area: 0% % Reduction in Volume: 64.3% Epithelialization: Small (1-33%) Tunneling: No Undermining: No Wound Description Classification: Category/Stage IV Exudate Amount: Medium Exudate Type: Serosanguineous Exudate Color: red, brown Foul Odor After Cleansing: No Slough/Fibrino Yes Wound Bed Granulation Amount: Large (67-100%) Exposed Structure Granulation Quality: Red Fat Layer (Subcutaneous Tissue) Exposed: Yes Necrotic Amount: Small (1-33%) Necrotic Quality: Adherent Slough Treatment Notes Wound #4 (Ischial Tuberosity) Wound Laterality: Left Cleanser Normal Saline Discharge Instruction: Wash your hands with soap and water. Remove old dressing, discard into plastic bag and place into trash. Cleanse the wound with Normal Saline prior to applying a clean dressing using gauze sponges, not tissues or cotton balls. Do not scrub or use excessive force. Pat dry using gauze sponges, not tissue or cotton balls. Wound Cleanser Discharge Instruction: Wash your hands with soap and water. Remove old dressing, discard into plastic bag and place into trash. Cleanse the wound with Wound Cleanser prior to applying a clean dressing using gauze sponges, not tissues or cotton balls. Do not [name], [name] ([id number]) scrub or use excessive force. Pat dry using gauze sponges, not tissue or cotton balls. Peri-Wound Care Topical Primary Dressing Hydrofera Blue Ready Transfer Foam, 2.5x2.5 (in/in) Discharge Instruction: Apply Hydrofera Blue  Ready to wound bed as directed Secondary Dressing (SILCONE BORDER) Zetuvit Plus SILICONE BORDER Dressing 5x5 (in/in) Discharge Instruction: Please do not put silicone bordered dressings under wraps. Use non-bordered dressing only. Secured With Compression Wrap Compression Stockings Add-Ons Electronic Signature(s) Signed: 03/19/2022 2:49:43 PM By: Levora Dredge Entered By: Levora Dredge on 03/19/2022 11:43:50 [name] ([id number]) -------------------------------------------------------------------------------- Vitals  Details Patient Name: [name], [name] Date of Service: 03/19/2022 11:30 AM Medical Record Number: [id number] Patient Account Number: [id number] Date of Birth/Sex: [birth year] (56 y.o. M) Treating RN: Levora Dredge Primary Care Kelsie Zaborowski: Fulton Reek Other Clinician: Referring Tico Crotteau: Fulton Reek Treating Alaysha Jefcoat/Extender: Skipper Cliche in Treatment: 9 Vital Signs Time Taken: 11:30 Temperature (F): 97.6 Height (in): 60 Pulse (bpm): 92 Respiratory Rate (breaths/min): 18 Blood Pressure (mmHg): 113/71 Reference Range: 80 - 120 mg / dl Electronic Signature(s) Signed: 03/19/2022 2:49:43 PM By: Levora Dredge Entered By: Levora Dredge on 03/19/2022 11:42:35

## 2022-04-02 ENCOUNTER — Ambulatory Visit: Payer: Medicare Other | Admitting: Physician Assistant

## 2022-04-09 ENCOUNTER — Ambulatory Visit: Payer: Medicare Other | Admitting: Physician Assistant

## 2022-04-19 ENCOUNTER — Encounter: Payer: Medicare Other | Attending: Physician Assistant | Admitting: Physician Assistant

## 2022-04-19 DIAGNOSIS — N319 Neuromuscular dysfunction of bladder, unspecified: Secondary | ICD-10-CM | POA: Insufficient documentation

## 2022-04-19 DIAGNOSIS — I2510 Atherosclerotic heart disease of native coronary artery without angina pectoris: Secondary | ICD-10-CM | POA: Insufficient documentation

## 2022-04-19 DIAGNOSIS — Z982 Presence of cerebrospinal fluid drainage device: Secondary | ICD-10-CM | POA: Insufficient documentation

## 2022-04-19 DIAGNOSIS — M1990 Unspecified osteoarthritis, unspecified site: Secondary | ICD-10-CM | POA: Diagnosis not present

## 2022-04-19 DIAGNOSIS — L89314 Pressure ulcer of right buttock, stage 4: Secondary | ICD-10-CM | POA: Diagnosis not present

## 2022-04-19 DIAGNOSIS — Z933 Colostomy status: Secondary | ICD-10-CM | POA: Insufficient documentation

## 2022-04-19 DIAGNOSIS — Q760 Spina bifida occulta: Secondary | ICD-10-CM | POA: Insufficient documentation

## 2022-04-19 DIAGNOSIS — L89323 Pressure ulcer of left buttock, stage 3: Secondary | ICD-10-CM | POA: Insufficient documentation

## 2022-04-19 DIAGNOSIS — G8220 Paraplegia, unspecified: Secondary | ICD-10-CM | POA: Insufficient documentation

## 2022-04-19 DIAGNOSIS — N183 Chronic kidney disease, stage 3 unspecified: Secondary | ICD-10-CM | POA: Insufficient documentation

## 2022-04-19 DIAGNOSIS — I129 Hypertensive chronic kidney disease with stage 1 through stage 4 chronic kidney disease, or unspecified chronic kidney disease: Secondary | ICD-10-CM | POA: Insufficient documentation

## 2022-04-19 NOTE — Progress Notes (Signed)
[name] ([id number]) Visit Report for 04/19/2022 Chief Complaint Document Details Patient Name: [name], [name] Date of Service: 04/19/2022 2:15 PM Medical Record Number: [id number] Patient Account Number: [id number] Date of Birth/Sex: 03/04/1966 (56 y.o. M) Treating RN: Levora Dredge Primary Care Provider: Fulton Reek Other Clinician: Massie Kluver Referring Provider: Fulton Reek Treating Provider/Extender: Skipper Cliche in Treatment: 13 Information Obtained from: Patient Chief Complaint Right ischial tuberosity pressure ulcer Electronic Signature(s) Signed: 04/19/2022 2:44:55 PM By: Worthy Keeler PA-C Entered By: Worthy Keeler on 04/19/2022 14:44:55 [name] ([id number]) -------------------------------------------------------------------------------- HPI Details Patient Name: [name] Date of Service: 04/19/2022 2:15 PM Medical Record Number: [id number] Patient Account Number: [id number] Date of Birth/Sex: 05/07/1966 (55 y.o. M) Treating RN: Levora Dredge Primary Care Provider: Fulton Reek Other Clinician: Massie Kluver Referring Provider: Fulton Reek Treating Provider/Extender: Skipper Cliche in Treatment: 13 History of Present Illness HPI Description: 56 year old male with a history of spina bifida presenting to [place] with a history of a open wound on the left gluteal region near his upper thigh which she's had for several weeks. He was seen in the ED recently at [facility] and was put on doxycycline and was advised some local care. his past medical history is significant for spinal bifid, neurogenic bladder, kidney stones, sacral pressure sores, constipation. Past surgical history significant for partial cystectomy, ileal conduit, VP shunt removal, percutaneous nephro lithotripsy. In the remote past the patient says he's had some treatment for a ulcer on this area and was treated with a skin  graft. Readmission: 10/16/2021 upon evaluation today patient appears to be doing somewhat poorly in regard to a fairly large wound noted over the right ischial tuberosity location. This is a stage IV pressure ulcer. Of note this is also positive for osteomyelitis upon a CT scan which was performed during the time that he was admitted in the hospital on 09/19/2021. With that being said it is noted in the right inferior buttock that he has a decubitus ulcer which extends to the posterior toe inferior right ischium where there is evidence of osteomyelitis. When he was here in the office actually missed that this was positive I missed read it and thought it said that there was no osteomyelitis. That is not the case there is in fact osteomyelitis noted here. I actually did review his notes as well in epic. Looking to the discharge summary it appears that the patient was admitted from 09/19/2021 through 09/23/2021. He was discharged home with home health care according to the note. With that being said from what I understand his sister actually takes care of him at this point. He was also to follow-up with a primary care provider and here at the wound care center within a week. I am just now seeing him on the 13th almost a month after discharge. He does have a history of again osteomyelitis of this right hip/ischial tuberosity location. 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 The reason for his admission was actually worsening of the decubitus ulcer upon admission. He does have chronic osteomyelitis of this area. He was treated with empiric antibiotics he had acute kidney injury which improved with IV fluids he also had hypokalemia. Surgical debridement was performed by general surgery at that time and ID was consulted to assist with management according to  the  notes. IV antibiotics were recommended but patient declined and wanted oral antibiotics at discharge. Therefore no IV antibiotics were planned for discharge time. This case was under the care of Dr. Steva Ready while he was in the hospital when she did discharge him with a 2-week course of doxycycline and Augmentin according to the notes. It looks like Dr. Doy Hutching office did call and leave a message for the patient to get scheduled for a follow-up visit after hospital discharge I do not see where he showed up in fact it appears that the patient has a no- show letter from Dr. Doy Hutching office noted in epic due to a appointment that was missed on 10/06/2021. Looking at the pictures from Dr. Raelene Bott notes on 09/23/2021 it does appear that the wound is a little bit better as far as the cleanliness of the surface of the wound today but nonetheless still is a very significant wound. 10/26/2021 upon evaluation today patient appears to be doing okay in regard to his wound. Again this is a wound that he did indeed have osteomyelitis and previous. With that being said I do not see any signs of a worsening infection at this time which is good news. Overall I think that we are headed in the right direction although still I think he needs more aggressive offloading. 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 I do believe this would be ideal for him to be honest. Readmission: 01-15-2022 upon evaluation today patient presents for reevaluation he was last seen on October 26, 2021. At that time unfortunately he was having issues with a significant ischial pressure ulcer on the right ischial tuberosity. Subsequently he ended up having decent response to the Dakin's moistened gauze dressing that was previously being utilized and subsequently the patient's sister who is his primary caregiver as well states that she decided just to try to manage this at home. Nonetheless unfortunately this is  getting a little larger not better. He does spend a lot of the day in his chair. He does have an air mattress according what they tell me today. That was previously ordered. With that being said I do believe that at this time things do appear to be doing much better from the standpoint of infection I do not see any signs of overt infection. There is also no evidence of systemic infection. No fevers, chills, nausea, vomiting, or diarrhea. With that being said I do believe based on what I am seeing the wound is a bit cleaner and he possibly could be an excellent candidate for a wound VAC. 02-05-2022 upon evaluation today patient appears to be doing about the same in regard to his wound. Unfortunately he is not any better but he also really has not had the wound VAC on sufficiently. There is been some confusion with the orders part of that I think is our follow-up with the way the order was written part of it may be with how the wound VAC is being placed either way we need to see what we can do to try to improve things in this regard. We will clarify the orders today going forward for home health. Unfortunately the patient does have a new wound today which is on the opposite side. This is not good 1 was bad enough have another area to open is definitely not a good thing. Its not as deep but nonetheless is also not very shallow either. We may potentially be able to get this  to the point that we could bridge the 2 together in order to wound VAC both but right now want a focus on to try to get the wound VAC going for the main wound initially we will likely use Hydrofera Blue on the new wound.Marland Kitchen 02-19-2022 upon evaluation today patient's wound appears to be doing a little bit worse in the way of maceration fortunately there is no signs of active infection locally or systemically which is great news. No fevers, chills, nausea, vomiting, or diarrhea. 03-05-2022 upon evaluation today patient unfortunately does not  appear to be doing nearly as good in regard to his wounds as I would like to see. Fortunately I do not see any evidence of active infection that is obvious although I am can obtain a wound culture to ensure that there is not anything getting worse here as far as the wound without the wound VAC on his concern. With that being said I will go ahead and get this sent in and evaluated will initiate treatment as needed going forward if necessary. With regard to the wound with the wound VAC this continues to be placed directly over the wound and there is obvious signs of deep tissue injury. The suction being here means that he is sitting on it I think this [name], [name] ([id number]) may be part of the reason why it is coming off although not 664% certain. 03-12-2022 upon evaluation today patient still has not gotten the antibiotics that were called in for him 4 days ago. He tells me that his sister just told him this morning that they were ready but that she has not had a chance to go pick them up. Nonetheless I feel like that this is really something he needs any should have been taken that not the other antibiotics which were not doing the job for him to be honest. The patient states and I completely agree that there is really no way he would have known relying on his sister to let him know obviously. Nonetheless I do believe that he needs to not be taking any other antibiotics and be on the Levaquin as soon as possible. He tells me that she said she was going to pick them up today. In regard to home health I am hopeful that they actually have been bridging this now. I do think that the wound looks better and this is good news. With that being said I am not certain that I can really tell for sure how this was attached it was removed before he came in and the black foam left in place. I really want him to leave the wound VAC in place until he comes in so that we can monitor and make sure that this is being  done correctly he voiced understanding. 03-19-2022 upon evaluation today and much happier with how things appear as far as the patient's wounds are concerned. I think he is on a much better track and they are doing a better job at bridging although its not quite where I like to see it is still more posterior which I think the patient needs to be a little bit more lateral on his side. Either way this is something that I did marked with a small dot today for him to tell the home health nurses well so she can bridge it to that location also think the tubing should go up instead of going down to just make it less cumbersome overall for him. 04-19-2022 upon evaluation today  patient appears to be doing a little better in regard to his wounds. Fortunately I do not see any signs of infection I do believe he is on the right track. The wound VAC does seem to be helping to clean the wound up a bit and [place]. This is good news. Electronic Signature(s) Signed: 04/19/2022 3:12:12 PM By: Worthy Keeler PA-C Entered By: Worthy Keeler on 04/19/2022 15:12:12 [name] ([id number]) -------------------------------------------------------------------------------- Physical Exam Details Patient Name: [name] Date of Service: 04/19/2022 2:15 PM Medical Record Number: [id number] Patient Account Number: [id number] Date of Birth/Sex: April 02, 1966 (55 y.o. M) Treating RN: Levora Dredge Primary Care Provider: Fulton Reek Other Clinician: Massie Kluver Referring Provider: Fulton Reek Treating Provider/Extender: Skipper Cliche in Treatment: 22 Constitutional Well-nourished and well-hydrated in no acute distress. Respiratory normal breathing without difficulty. Psychiatric this patient is able to make decisions and demonstrates good insight into disease process. Alert and Oriented x 3. pleasant and cooperative. Notes Upon inspection patient's wound bed actually  showed signs of good granulation and epithelization at this point. Fortunately I do not see any evidence of active infection locally or systemically which is great news and overall I am extremely pleased I think that the patient is making progress here. No sharp debridement was necessary today. Electronic Signature(s) Signed: 04/19/2022 3:13:21 PM By: Worthy Keeler PA-C Entered By: Worthy Keeler on 04/19/2022 15:13:21 [name] ([id number]) -------------------------------------------------------------------------------- Physician Orders Details Patient Name: [name] Date of Service: 04/19/2022 2:15 PM Medical Record Number: [id number] Patient Account Number: [id number] Date of Birth/Sex: 07/28/1966 (55 y.o. M) Treating RN: Levora Dredge Primary Care Provider: Fulton Reek Other Clinician: Massie Kluver Referring Provider: Fulton Reek Treating Provider/Extender: Skipper Cliche in Treatment: 47 Verbal / Phone Orders: No Diagnosis Coding ICD-10 Coding Code Description L89.314 Pressure ulcer of right buttock, stage 4 L89.323 Pressure ulcer of left buttock, stage 3 Q76.0 Spina bifida occulta Z93.3 Colostomy status I10 Essential (primary) hypertension I25.10 Atherosclerotic heart disease of native coronary artery without angina pectoris Follow-up Appointments o Return Appointment in 2 weeks. o Nurse Visit as needed [place]: - [facility] o ADMIT to [facility] for wound care. May utilize formulary equivalent dressing for wound treatment orders unless otherwise specified. Home Health Nurse may visit PRN to address patientos wound care needs. o Scheduled days for dressing changes to be completed; exception, patient has scheduled wound care visit that day. o **Please direct any NON-WOUND related issues/requests for orders to patient's Primary Care Physician. **If current dressing causes regression in wound condition, may  D/C ordered dressing product/s and apply Normal Saline Moist Dressing daily until next [place] or Other MD appointment. **Notify [facility] of regression in wound condition at [phone]. o Other Home Health Orders/Instructions: - Black dot written on patients hip area where PA Stone would like wound vac bridged up to please. Bathing/ Shower/ Hygiene o Clean wound with Normal Saline or wound cleanser. - keep dressing dry or change after shower o No tub bath. Off-Loading o Gel wheelchair cushion o Hospital bed/mattress - Has at home o Air fluidized (Group 3) - Has from [employer] and reposition every 2 hours - Do not sit in wheelchair any longer than you have to--offload wound in bed as much as possible Additional Orders / Instructions o Follow Nutritious Diet and Increase Protein Intake o Other: - Refer to ostomy clinic in [facility] if desired Negative Pressure Wound Therapy  Wound #3 Right Ischial Tuberosity o Wound VAC settings at 167mHg continuous pressure. Use foam to wound cavity. Please order WHITE foam to fill any tunnel/s and/or undermining when necessary. Change VAC dressing 3 X WEEK. Change canister as indicated when full. - Change Monday, Wednesday, and Friday o Home Health Nurse may d/c VAC for s/s of increased infection, significant wound regression, or uncontrolled drainage. [phone] o Number of foam/gauze pieces used in the dressing = - 1 piece of black foam place into the wound bed. This needs to be bridged to the side of the abdomen/hip area so that the patient will not be sitting on the suction pad and tubing when up in his chair. This will also help the suction pad to stay in place. o Other: - Please do not remove wound vac prior to wound clinic visit so we can assess application Medications-Please add to medication list. o P.O. Antibiotics - PLEASE pick up you called in  antibiotic and start ASAP Wound Treatment [name], [name]([id number] Wound #3 - Ischial Tuberosity Wound Laterality: Right Cleanser: Byram Ancillary Kit - 15 Day Supply (Generic) 1 x Per Day/30 Days Discharge Instructions: Use supplies as instructed; Kit contains: (15) Saline Bullets; (15) 3x3 Gauze; 15 pr Gloves Cleanser: Normal Saline 1 x Per Day/30 Days Discharge Instructions: Wash your hands with soap and water. Remove old dressing, discard into plastic bag and place into trash. Cleanse the wound with Normal Saline prior to applying a clean dressing using gauze sponges, not tissues or cotton balls. Do not scrub or use excessive force. Pat dry using gauze sponges, not tissue or cotton balls. Cleanser: Wound Cleanser 1 x Per Day/30 Days Discharge Instructions: Wash your hands with soap and water. Remove old dressing, discard into plastic bag and place into trash. Cleanse the wound with Wound Cleanser prior to applying a clean dressing using gauze sponges, not tissues or cotton balls. Do not scrub or use excessive force. Pat dry using gauze sponges, not tissue or cotton balls. Primary Dressing: Gauze (Generic) 1 x Per Day/30 Days Discharge Instructions: moistened with dakins Secondary Dressing: ABD Pad 5x9 (in/in) (Generic) 1 x Per Day/30 Days Discharge Instructions: Cover with ABD pad Secured With: Medipore Tape - 26M Medipore H Soft Cloth Surgical Tape, 2x2 (in/yd) (Generic) 1 x Per Day/30 Days Wound #4 - Ischial Tuberosity Wound Laterality: Left Cleanser: Normal Saline 3 x Per Week/30 Days Discharge Instructions: Wash your hands with soap and water. Remove old dressing, discard into plastic bag and place into trash. Cleanse the wound with Normal Saline prior to applying a clean dressing using gauze sponges, not tissues or cotton balls. Do not scrub or use excessive force. Pat dry using gauze sponges, not tissue or cotton balls. Cleanser: Wound Cleanser 3 x Per Week/30 Days Discharge  Instructions: Wash your hands with soap and water. Remove old dressing, discard into plastic bag and place into trash. Cleanse the wound with Wound Cleanser prior to applying a clean dressing using gauze sponges, not tissues or cotton balls. Do not scrub or use excessive force. Pat dry using gauze sponges, not tissue or cotton balls. Primary Dressing: Hydrofera Blue Ready Transfer Foam, 2.5x2.5 (in/in) 3 x Per Week/30 Days Discharge Instructions: Apply Hydrofera Blue Ready to wound bed as directed Secondary Dressing: (SILCONE BORDER) Zetuvit Plus SILICONE BORDER Dressing 5x5 (in/in) 3 x Per Week/30 Days Discharge Instructions: Please do not put silicone bordered dressings under wraps. Use non-bordered dressing only. Electronic Signature(s) Unsigned Entered By:  Massie Kluver on 04/19/2022 15:22:44 Signature(s): Date(s): [name], [name] ([id number]) -------------------------------------------------------------------------------- Problem List Details Patient Name: [name], [name] Date of Service: 04/19/2022 2:15 PM Medical Record Number: [id number] Patient Account Number: [id number] Date of Birth/Sex: 01/16/1966 (56 y.o. M) Treating RN: Levora Dredge Primary Care Provider: Fulton Reek Other Clinician: Massie Kluver Referring Provider: Fulton Reek Treating Provider/Extender: Skipper Cliche in Treatment: 13 Active Problems ICD-10 Encounter Code Description Active Date MDM Diagnosis L89.314 Pressure ulcer of right buttock, stage 4 01/15/2022 No Yes L89.323 Pressure ulcer of left buttock, stage 3 02/05/2022 No Yes Q76.0 Spina bifida occulta 01/15/2022 No Yes Z93.3 Colostomy status 01/15/2022 No Yes I10 Essential (primary) hypertension 01/15/2022 No Yes I25.10 Atherosclerotic heart disease of native coronary artery without angina 01/15/2022 No Yes pectoris Inactive Problems Resolved Problems Electronic Signature(s) Signed: 04/19/2022 2:44:47 PM By: Worthy Keeler  PA-C Entered By: Worthy Keeler on 04/19/2022 14:44:47 [name] ([id number]) -------------------------------------------------------------------------------- Progress Note Details Patient Name: [name] Date of Service: 04/19/2022 2:15 PM Medical Record Number: [id number] Patient Account Number: [id number] Date of Birth/Sex: [birth year] (55 y.o. M) Treating RN: Levora Dredge Primary Care Provider: Fulton Reek Other Clinician: Massie Kluver Referring Provider: Fulton Reek Treating Provider/Extender: Skipper Cliche in Treatment: 13 Subjective Chief Complaint Information obtained from Patient Right ischial tuberosity pressure ulcer History of Present Illness (HPI) 57 year old male with a history of spina bifida presenting to [place] with a history of a open wound on the left gluteal region near his upper thigh which she's had for several weeks. He was seen in the ED recently at [facility] and was put on doxycycline and was advised some local care. his past medical history is significant for spinal bifid, neurogenic bladder, kidney stones, sacral pressure sores, constipation. Past surgical history significant for partial cystectomy, ileal conduit, VP shunt removal, percutaneous nephro lithotripsy. In the remote past the patient says he's had some treatment for a ulcer on this area and was treated with a skin graft. Readmission: 10/16/2021 upon evaluation today patient appears to be doing somewhat poorly in regard to a fairly large wound noted over the right ischial tuberosity location. This is a stage IV pressure ulcer. Of note this is also positive for osteomyelitis upon a CT scan which was performed during the time that he was admitted in the hospital on 09/19/2021. With that being said it is noted in the right inferior buttock that he has a decubitus ulcer which extends to the posterior toe inferior right ischium where there is evidence of osteomyelitis.  When he was here in the office actually missed that this was positive I missed read it and thought it said that there was no osteomyelitis. That is not the case there is in fact osteomyelitis noted here. I actually did review his notes as well in epic. Looking to the discharge summary it appears that the patient was admitted from 09/19/2021 through 09/23/2021. He was discharged home with home health care according to the note. With that being said from what I understand his sister actually takes care of him at this point. He was also to follow-up with a primary care provider and here at the wound care center within a week. I am just now seeing him on the 13th almost a month after discharge. He does have a history of again osteomyelitis of this right hip/ischial tuberosity location. 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 The reason for his admission was actually worsening of the decubitus ulcer upon admission. He does have chronic osteomyelitis of this area. He was treated with empiric antibiotics he had acute kidney injury which improved with IV fluids he also had hypokalemia. Surgical debridement was performed by general surgery at that time and ID was consulted to assist with management according to the notes. IV antibiotics were recommended but patient declined and wanted oral antibiotics at discharge. Therefore no IV antibiotics were planned for discharge time. This case was under the care of Dr. Steva Ready while he was in the hospital when she did discharge him with a 2-week course of doxycycline and Augmentin according to the notes. It looks like Dr. Doy Hutching office did call and leave a message for the patient to get scheduled for a follow-up visit after hospital discharge I do not see where he showed up in fact it appears that the patient has a  no- show letter from Dr. Doy Hutching office noted in epic due to a appointment that was missed on 10/06/2021. Looking at the pictures from Dr. Raelene Bott notes on 09/23/2021 it does appear that the wound is a little bit better as far as the cleanliness of the surface of the wound today but nonetheless still is a very significant wound. 10/26/2021 upon evaluation today patient appears to be doing okay in regard to his wound. Again this is a wound that he did indeed have osteomyelitis and previous. With that being said I do not see any signs of a worsening infection at this time which is good news. Overall I think that we are headed in the right direction although still I think he needs more aggressive offloading. 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 I do believe this would be ideal for him to be honest. Readmission: 01-15-2022 upon evaluation today patient presents for reevaluation he was last seen on October 26, 2021. At that time unfortunately he was having issues with a significant ischial pressure ulcer on the right ischial tuberosity. Subsequently he ended up having decent response to the Dakin's moistened gauze dressing that was previously being utilized and subsequently the patient's sister who is his primary caregiver as well states that she decided just to try to manage this at home. Nonetheless unfortunately this is getting a little larger not better. He does spend a lot of the day in his chair. He does have an air mattress according what they tell me today. That was previously ordered. With that being said I do believe that at this time things do appear to be doing much better from the standpoint of infection I do not see any signs of overt infection. There is also no evidence of systemic infection. No fevers, chills, nausea, vomiting, or diarrhea. With that being said I do believe based on what I am seeing the wound is a bit cleaner and he possibly could be an  excellent candidate for a wound VAC. 02-05-2022 upon evaluation today patient appears to be doing about the same in regard to his wound. Unfortunately he is not any better but he also really has not had the wound VAC on sufficiently. There is been some confusion with the orders part of that I think is our follow-up with the way the order was written part of it may be with how the wound VAC is being placed either way we need to see what we  can do to try to improve things in this regard. We will clarify the orders today going forward for home health. Unfortunately the patient does have a new wound today which is on the opposite side. This is not good 1 was bad enough have another area to open is definitely not a good thing. Its not as deep but nonetheless is also not very shallow either. We may potentially be able to get this to the point that we could bridge the 2 together in order to wound VAC both but right now want a focus on to try to get the wound VAC going for the main wound initially we will likely use Hydrofera Blue on the new wound.Marland Kitchen 02-19-2022 upon evaluation today patient's wound appears to be doing a little bit worse in the way of maceration fortunately there is no signs of active infection locally or systemically which is great news. No fevers, chills, nausea, vomiting, or diarrhea. [name], [name] ([id number]) 03-05-2022 upon evaluation today patient unfortunately does not appear to be doing nearly as good in regard to his wounds as I would like to see. Fortunately I do not see any evidence of active infection that is obvious although I am can obtain a wound culture to ensure that there is not anything getting worse here as far as the wound without the wound VAC on his concern. With that being said I will go ahead and get this sent in and evaluated will initiate treatment as needed going forward if necessary. With regard to the wound with the wound VAC this continues to be placed directly  over the wound and there is obvious signs of deep tissue injury. The suction being here means that he is sitting on it I think this may be part of the reason why it is coming off although not 007% certain. 03-12-2022 upon evaluation today patient still has not gotten the antibiotics that were called in for him 4 days ago. He tells me that his sister just told him this morning that they were ready but that she has not had a chance to go pick them up. Nonetheless I feel like that this is really something he needs any should have been taken that not the other antibiotics which were not doing the job for him to be honest. The patient states and I completely agree that there is really no way he would have known relying on his sister to let him know obviously. Nonetheless I do believe that he needs to not be taking any other antibiotics and be on the Levaquin as soon as possible. He tells me that she said she was going to pick them up today. In regard to home health I am hopeful that they actually have been bridging this now. I do think that the wound looks better and this is good news. With that being said I am not certain that I can really tell for sure how this was attached it was removed before he came in and the black foam left in place. I really want him to leave the wound VAC in place until he comes in so that we can monitor and make sure that this is being done correctly he voiced understanding. 03-19-2022 upon evaluation today and much happier with how things appear as far as the patient's wounds are concerned. I think he is on a much better track and they are doing a better job at bridging although its not quite where I like to see it is  still more posterior which I think the patient needs to be a little bit more lateral on his side. Either way this is something that I did marked with a small dot today for him to tell the home health nurses well so she can bridge it to that location also think the  tubing should go up instead of going down to just make it less cumbersome overall for him. 04-19-2022 upon evaluation today patient appears to be doing a little better in regard to his wounds. Fortunately I do not see any signs of infection I do believe he is on the right track. The wound VAC does seem to be helping to clean the wound up a bit and bring [place]. This is good news. Objective Constitutional Well-nourished and well-hydrated in no acute distress. Vitals Time Taken: 2:30 PM, Height: 60 in, Temperature: 97.7 F, Pulse: 92 bpm, Respiratory Rate: 18 breaths/min, Blood Pressure: 102/63 mmHg. Respiratory normal breathing without difficulty. Psychiatric this patient is able to make decisions and demonstrates good insight into disease process. Alert and Oriented x 3. pleasant and cooperative. General Notes: Upon inspection patient's wound bed actually showed signs of good granulation and epithelization at this point. Fortunately I do not see any evidence of active infection locally or systemically which is great news and overall I am extremely pleased I think that the patient is making progress here. No sharp debridement was necessary today. Integumentary (Hair, Skin) Wound #3 status is Open. Original cause of wound was Gradually Appeared. The date acquired was: 09/03/2021. The wound has been in treatment 13 weeks. The wound is located on the Right Ischial Tuberosity. The wound measures 3cm length x 3.6cm width x 1.4cm depth; 8.482cm^2 area and 11.875cm^3 volume. There is Fat Layer (Subcutaneous Tissue) exposed. There is a large amount of serosanguineous drainage noted. There is large (67-100%) red, pink granulation within the wound bed. There is a small (1-33%) amount of necrotic tissue within the wound bed including Eschar and Adherent Slough. Assessment Active Problems ICD-10 Pressure ulcer of right buttock, stage 4 Pressure ulcer of left buttock, stage 3 Spina  bifida occulta Colostomy status Essential (primary) hypertension Atherosclerotic heart disease of native coronary artery without angina pectoris Madrone, [name] ([id number]) Plan 1. I would recommend currently that we continue with the wound VAC for the wound on the right ischial tuberosity which does seem to be helping at this point. 2. On the left side we are using the [facility] which I think has also been of benefit. 3. Also can recommend the patient should continue to monitor for any signs of worsening or infection. If anything changes he or one of the caregivers should let me know. Right now though I see no infection and I think we are on the right track. We will see patient back for reevaluation in 1 week here in the clinic. If anything worsens or changes patient will contact our office for additional recommendations. Electronic Signature(s) Signed: 04/19/2022 3:36:15 PM By: Worthy Keeler PA-C Entered By: Worthy Keeler on 04/19/2022 15:36:15 [name] ([id number]) -------------------------------------------------------------------------------- SuperBill Details Patient Name: [name] Date of Service: 04/19/2022 Medical Record Number: [id number] Patient Account Number: [id number] Date of Birth/Sex: 1965/10/14 (55 y.o. M) Treating RN: Levora Dredge Primary Care Provider: Fulton Reek Other Clinician: Massie Kluver Referring Provider: Fulton Reek Treating Provider/Extender: Skipper Cliche in Treatment: 13 Diagnosis Coding ICD-10 Codes Code Description L89.314 Pressure ulcer of right buttock, stage 4 L89.323 Pressure ulcer of  left buttock, stage 3 Q76.0 Spina bifida occulta Z93.3 Colostomy status I10 Essential (primary) hypertension I25.10 Atherosclerotic heart disease of native coronary artery without angina pectoris Facility Procedures CPT4 Code: [id number] Description: 99214 - WOUND CARE VISIT-LEV 4 EST PT Modifier: Quantity:  1 Physician Procedures CPT4 Code: [id number] Description: 99214 - WC PHYS LEVEL 4 - EST PT Modifier: Quantity: 1 CPT4 Code: Description: ICD-10 Diagnosis Description L89.314 Pressure ulcer of right buttock, stage 4 L89.323 Pressure ulcer of left buttock, stage 3 Q76.0 Spina bifida occulta Z93.3 Colostomy status Modifier: Quantity: Electronic Signature(s) Signed: 04/19/2022 3:36:42 PM By: Worthy Keeler PA-C Previous Signature: 04/19/2022 3:36:26 PM Version By: Worthy Keeler PA-C Entered By: Worthy Keeler on 04/19/2022 15:36:42

## 2022-04-21 NOTE — Progress Notes (Signed)
LORI, [name] ([id number]) Visit Report for 04/19/2022 Arrival Information Details Patient Name: [name], [name] Date of Service: 04/19/2022 2:15 PM Medical Record Number: [id number] Patient Account Number: [id number] Date of Birth/Sex: January 20, 1966 (56 y.o. M) Treating RN: Levora Dredge Primary Care Tamana Hatfield: Fulton Reek Other Clinician: Massie Kluver Referring Tremain Rucinski: Fulton Reek Treating Tyeisha Dinan/Extender: Skipper Cliche in Treatment: 24 Visit Information History Since Last Visit All ordered tests and consults were completed: No Patient Arrived: Wheel Chair Added or deleted any medications: No Arrival Time: 14:21 Any new allergies or adverse reactions: No Transfer Assistance: Transfer Board Had a fall or experienced change in No Patient Requires Transmission-Based Precautions: No activities of daily living that may affect Patient Has Alerts: Yes risk of falls: Patient Alerts: NOT diabetic Hospitalized since last visit: No Pain Present Now: No Electronic Signature(s) Signed: 04/21/2022 8:34:35 AM By: Massie Kluver Entered By: Massie Kluver on 04/19/2022 14:29:53 [name] ([id number]) -------------------------------------------------------------------------------- Clinic Level of Care Assessment Details Patient Name: [name] Date of Service: 04/19/2022 2:15 PM Medical Record Number: [id number] Patient Account Number: [id number] Date of Birth/Sex: Dec 17, 1965 (55 y.o. M) Treating RN: Levora Dredge Primary Care Lunabella Badgett: Fulton Reek Other Clinician: Massie Kluver Referring Jayley Hustead: Fulton Reek Treating Terrelle Ruffolo/Extender: Skipper Cliche in Treatment: 13 Clinic Level of Care Assessment Items TOOL 4 Quantity Score '[]'  - Use when only an EandM is performed on FOLLOW-UP visit 0 ASSESSMENTS - Nursing Assessment / Reassessment X - Reassessment of Co-morbidities (includes updates in patient status) 1 10 X- 1 5 Reassessment of  Adherence to Treatment Plan ASSESSMENTS - Wound and Skin Assessment / Reassessment '[]'  - Simple Wound Assessment / Reassessment - one wound 0 X- 2 5 Complex Wound Assessment / Reassessment - multiple wounds '[]'  - 0 Dermatologic / Skin Assessment (not related to wound area) ASSESSMENTS - Focused Assessment '[]'  - Circumferential Edema Measurements - multi extremities 0 '[]'  - 0 Nutritional Assessment / Counseling / Intervention '[]'  - 0 Lower Extremity Assessment (monofilament, tuning fork, pulses) '[]'  - 0 Peripheral Arterial Disease Assessment (using hand held doppler) ASSESSMENTS - Ostomy and/or Continence Assessment and Care '[]'  - Incontinence Assessment and Management 0 '[]'  - 0 Ostomy Care Assessment and Management (repouching, etc.) PROCESS - Coordination of Care X - Simple Patient / Family Education for ongoing care 1 15 '[]'  - 0 Complex (extensive) Patient / Family Education for ongoing care '[]'  - 0 Staff obtains Programmer, systems, Records, Test Results / Process Orders '[]'  - 0 Staff telephones HHA, Nursing Homes / Clarify orders / etc '[]'  - 0 Routine Transfer to another Facility (non-emergent condition) '[]'  - 0 Routine Hospital Admission (non-emergent condition) '[]'  - 0 New Admissions / Biomedical engineer / Ordering NPWT, Apligraf, etc. '[]'  - 0 Emergency Hospital Admission (emergent condition) X- 1 10 Simple Discharge Coordination '[]'  - 0 Complex (extensive) Discharge Coordination PROCESS - Special Needs '[]'  - Pediatric / Minor Patient Management 0 '[]'  - 0 Isolation Patient Management '[]'  - 0 Hearing / Language / Visual special needs '[]'  - 0 Assessment of Community assistance (transportation, D/C planning, etc.) '[]'  - 0 Additional assistance / Altered mentation '[]'  - 0 Support Surface(s) Assessment (bed, cushion, seat, etc.) INTERVENTIONS - Wound Cleansing / Measurement [name], [name] ([id number]) '[]'  - 0 Simple Wound Cleansing - one wound X- 2 5 Complex Wound Cleansing - multiple  wounds X- 1 5 Wound Imaging (photographs - any number of wounds) '[]'  - 0 Wound Tracing (instead of photographs) '[]'  - 0 Simple Wound Measurement - one wound X- 2 5 Complex Wound Measurement -  multiple wounds INTERVENTIONS - Wound Dressings '[]'  - Small Wound Dressing one or multiple wounds 0 X- 2 15 Medium Wound Dressing one or multiple wounds '[]'  - 0 Large Wound Dressing one or multiple wounds '[]'  - 0 Application of Medications - topical '[]'  - 0 Application of Medications - injection INTERVENTIONS - Miscellaneous '[]'  - External ear exam 0 '[]'  - 0 Specimen Collection (cultures, biopsies, blood, body fluids, etc.) '[]'  - 0 Specimen(s) / Culture(s) sent or taken to Lab for analysis X- 1 10 Patient Transfer (multiple staff / Harrel Lemon Lift / Similar devices) '[]'  - 0 Simple Staple / Suture removal (25 or less) '[]'  - 0 Complex Staple / Suture removal (26 or more) '[]'  - 0 Hypo / Hyperglycemic Management (close monitor of Blood Glucose) '[]'  - 0 Ankle / Brachial Index (ABI) - do not check if billed separately X- 1 5 Vital Signs Has the patient been seen at the hospital within the last three years: Yes Total Score: 120 Level Of Care: New/Established - Level 4 Electronic Signature(s) Signed: 04/21/2022 8:34:35 AM By: Massie Kluver Entered By: Massie Kluver on 04/19/2022 15:24:05 [name] ([id number]) -------------------------------------------------------------------------------- Encounter Discharge Information Details Patient Name: [name] Date of Service: 04/19/2022 2:15 PM Medical Record Number: [id number] Patient Account Number: [id number] Date of Birth/Sex: June 25, 1966 (55 y.o. M) Treating RN: Levora Dredge Primary Care Dian Minahan: Fulton Reek Other Clinician: Massie Kluver Referring Monque Haggar: Fulton Reek Treating Larina Lieurance/Extender: Skipper Cliche in Treatment: 13 Encounter Discharge Information Items Discharge Condition: Stable Ambulatory Status:  Wheelchair Discharge Destination: Home Transportation: Other Accompanied By: self Schedule Follow-up Appointment: Yes Clinical Summary of Care: Electronic Signature(s) Signed: 04/21/2022 8:34:35 AM By: Massie Kluver Entered By: Massie Kluver on 04/19/2022 15:25:55 [name] ([id number]) -------------------------------------------------------------------------------- Lower Extremity Assessment Details Patient Name: [name] Date of Service: 04/19/2022 2:15 PM Medical Record Number: [id number] Patient Account Number: [id number] Date of Birth/Sex: Sep 11, 1966 (55 y.o. M) Treating RN: Levora Dredge Primary Care Ahmod Gillespie: Fulton Reek Other Clinician: Massie Kluver Referring Ronnell Clinger: Fulton Reek Treating Sharrell Krawiec/Extender: Skipper Cliche in Treatment: 13 Electronic Signature(s) Signed: 04/19/2022 4:46:59 PM By: Levora Dredge Signed: 04/21/2022 8:34:35 AM By: Massie Kluver Entered By: Massie Kluver on 04/19/2022 15:21:23 [name] ([id number]) -------------------------------------------------------------------------------- Multi Wound Chart Details Patient Name: [name] Date of Service: 04/19/2022 2:15 PM Medical Record Number: [id number] Patient Account Number: [id number] Date of Birth/Sex: 1965-11-02 (56 y.o. M) Treating RN: Levora Dredge Primary Care Dajohn Ellender: Fulton Reek Other Clinician: Massie Kluver Referring Dorean Hiebert: Fulton Reek Treating Dailey Buccheri/Extender: Skipper Cliche in Treatment: 13 Vital Signs Height(in): 60 Pulse(bpm): 32 Weight(lbs): Blood Pressure(mmHg): 102/63 Body Mass Index(BMI): Temperature(F): 97.7 Respiratory Rate(breaths/min): 18 Photos: [N/A:N/A] Wound Location: Right Ischial Tuberosity Left Ischial Tuberosity N/A Wounding Event: Gradually Appeared Gradually Appeared N/A Primary Etiology: Pressure Ulcer Pressure Ulcer N/A Comorbid History: Cataracts, Glaucoma, Coronary Cataracts,  Glaucoma, Coronary N/A Artery Disease, Hypertension, Artery Disease, Hypertension, History of pressure wounds, History of pressure wounds, Osteoarthritis, Paraplegia Osteoarthritis, Paraplegia Date Acquired: 09/03/2021 01/29/2022 N/A Weeks of Treatment: 13 10 N/A Wound Status: Open Open N/A Wound Recurrence: No No N/A Measurements L x W x D (cm) 3x3.6x1.4 3x1.5x1.4 N/A Area (cm) : 8.482 3.534 N/A Volume (cm) : 11.875 4.948 N/A % Reduction in Area: 51.80% -200.00% N/A % Reduction in Volume: 73.00% -50.00% N/A Classification: Category/Stage IV Category/Stage IV N/A Exudate Amount: Large Medium N/A Exudate Type: Serosanguineous Serosanguineous N/A Exudate Color: red, brown red, brown N/A Granulation Amount: Large (67-100%) Large (67-100%) N/A Granulation Quality: Red, Pink Red N/A Necrotic Amount: Small (1-33%)  Small (1-33%) N/A Necrotic Tissue: Eschar, Adherent [name] N/A Exposed Structures: Fat Layer (Subcutaneous Tissue): Fat Layer (Subcutaneous Tissue): N/A Yes Yes Epithelialization: None Small (1-33%) N/A Treatment Notes Electronic Signature(s) Signed: 04/21/2022 8:34:35 AM By: Massie Kluver Entered By: Massie Kluver on 04/19/2022 15:21:43 [name] ([id number]) -------------------------------------------------------------------------------- [place] Details Patient Name: [name] Date of Service: 04/19/2022 2:15 PM Medical Record Number: [id number] Patient Account Number: [id number] Date of Birth/Sex: [birth year] (55 y.o. M) Treating RN: Levora Dredge Primary Care Caspian Deleonardis: Fulton Reek Other Clinician: Massie Kluver Referring Brecklyn Galvis: Fulton Reek Treating Jaylon Boylen/Extender: Skipper Cliche in Treatment: 13 Active Inactive Pressure Nursing Diagnoses: Knowledge deficit related to causes and risk factors for pressure ulcer development Knowledge deficit related to management of pressures ulcers Potential  for impaired tissue integrity related to pressure, friction, moisture, and shear Goals: Patient will remain free from development of additional pressure ulcers Date Initiated: 01/15/2022 Target Resolution Date: 02/26/2022 Goal Status: Active Patient/caregiver will verbalize risk factors for pressure ulcer development Date Initiated: 01/15/2022 Target Resolution Date: 03/05/2022 Goal Status: Active Interventions: Assess: immobility, friction, shearing, incontinence upon admission and as needed Assess offloading mechanisms upon admission and as needed Assess potential for pressure ulcer upon admission and as needed Notes: Electronic Signature(s) Signed: 04/19/2022 4:46:59 PM By: Levora Dredge Signed: 04/21/2022 8:34:35 AM By: Massie Kluver Entered By: Massie Kluver on 04/19/2022 15:21:32 [name] ([id number]) -------------------------------------------------------------------------------- Pain Assessment Details Patient Name: [name] Date of Service: 04/19/2022 2:15 PM Medical Record Number: [id number] Patient Account Number: [id number] Date of Birth/Sex: 10-07-1965 (56 y.o. M) Treating RN: Levora Dredge Primary Care Ivelisse Culverhouse: Fulton Reek Other Clinician: Massie Kluver Referring Jessalynn Mccowan: Fulton Reek Treating Aundray Cartlidge/Extender: Skipper Cliche in Treatment: 13 Active Problems Location of Pain Severity and Description of Pain Patient Has Paino No Site Locations Pain Management and Medication Current Pain Management: Electronic Signature(s) Signed: 04/19/2022 4:46:59 PM By: Levora Dredge Signed: 04/21/2022 8:34:35 AM By: Massie Kluver Entered By: Massie Kluver on 04/19/2022 14:32:33 [name] ([id number]) -------------------------------------------------------------------------------- Patient/Caregiver Education Details Patient Name: [name] Date of Service: 04/19/2022 2:15 PM Medical Record Number: [id number] Patient Account  Number: [id number] Date of Birth/Gender: [birth year] (56 y.o. M) Treating RN: Levora Dredge Primary Care Physician: Fulton Reek Other Clinician: Massie Kluver Referring Physician: Fulton Reek Treating Physician/Extender: Skipper Cliche in Treatment: 13 Education Assessment Education Provided To: Patient Education Topics Provided Wound/Skin Impairment: Handouts: Other: continue wound care as directed Methods: Explain/Verbal Responses: State content correctly Electronic Signature(s) Signed: 04/21/2022 8:34:35 AM By: Massie Kluver Entered By: Massie Kluver on 04/19/2022 15:24:54 [name] ([id number]) -------------------------------------------------------------------------------- Wound Assessment Details Patient Name: [name] Date of Service: 04/19/2022 2:15 PM Medical Record Number: [id number] Patient Account Number: [id number] Date of Birth/Sex: Mar 19, 1966 (55 y.o. M) Treating RN: Levora Dredge Primary Care Rudransh Bellanca: Fulton Reek Other Clinician: Massie Kluver Referring Soniya Ashraf: Fulton Reek Treating Antavius Sperbeck/Extender: Skipper Cliche in Treatment: 13 Wound Status Wound Number: 3 Primary Pressure Ulcer Etiology: Wound Location: Right Ischial Tuberosity Wound Open Wounding Event: Gradually Appeared Status: Date Acquired: 09/03/2021 Comorbid Cataracts, Glaucoma, Coronary Artery Disease, Weeks Of Treatment: 13 History: Hypertension, History of pressure wounds, Osteoarthritis, Clustered Wound: No Paraplegia Photos Wound Measurements Length: (cm) 3 Width: (cm) 3.6 Depth: (cm) 1.4 Area: (cm) 8.482 Volume: (cm) 11.875 % Reduction in Area: 51.8% % Reduction in Volume: 73% Epithelialization: None Wound Description Classification: Category/Stage IV Exudate Amount: Large Exudate Type: Serosanguineous Exudate Color: red, brown Wound Bed Granulation Amount: Large (67-100%) Exposed Structure Granulation Quality: Red, Pink Fat  Layer (Subcutaneous  Tissue) Exposed: Yes Necrotic Amount: Small (1-33%) Necrotic Quality: Eschar, Adherent Slough Treatment Notes Wound #3 (Ischial Tuberosity) Wound Laterality: Right Cleanser [place] Ancillary Kit - 15 Day Supply Discharge Instruction: Use supplies as instructed; Kit contains: (15) Saline Bullets; (15) 3x3 Gauze; 15 pr Gloves Normal Saline Discharge Instruction: Wash your hands with soap and water. Remove old dressing, discard into plastic bag and place into trash. Cleanse the wound with Normal Saline prior to applying a clean dressing using gauze sponges, not tissues or cotton balls. Do not scrub or use excessive force. Pat dry using gauze sponges, not tissue or cotton balls. Wound Cleanser BOLDEN, HAGERMAN ([id number]) Discharge Instruction: Wash your hands with soap and water. Remove old dressing, discard into plastic bag and place into trash. Cleanse the wound with Wound Cleanser prior to applying a clean dressing using gauze sponges, not tissues or cotton balls. Do not scrub or use excessive force. Pat dry using gauze sponges, not tissue or cotton balls. Peri-Wound Care Topical Primary Dressing Gauze Discharge Instruction: moistened with dakins Secondary Dressing ABD Pad 5x9 (in/in) Discharge Instruction: Cover with ABD pad Secured With [place] H Soft Cloth Surgical Tape, 2x2 (in/yd) Compression Wrap Compression Stockings Add-Ons Electronic Signature(s) Signed: 04/19/2022 4:46:59 PM By: Levora Dredge Signed: 04/21/2022 8:34:35 AM By: Massie Kluver Entered By: Massie Kluver on 04/19/2022 14:52:40 [name] ([id number]) -------------------------------------------------------------------------------- Wound Assessment Details Patient Name: [name] Date of Service: 04/19/2022 2:15 PM Medical Record Number: [id number] Patient Account Number: [id number] Date of Birth/Sex: Nov 07, 1965 (56 y.o. M) Treating RN: Levora Dredge Primary Care Birgit Nowling: Fulton Reek Other Clinician: Massie Kluver Referring Macaria Bias: Fulton Reek Treating Navina Wohlers/Extender: Skipper Cliche in Treatment: 13 Wound Status Wound Number: 4 Primary Pressure Ulcer Etiology: Wound Location: Left Ischial Tuberosity Wound Open Wounding Event: Gradually Appeared Status: Date Acquired: 01/29/2022 Comorbid Cataracts, Glaucoma, Coronary Artery Disease, Weeks Of Treatment: 10 History: Hypertension, History of pressure wounds, Osteoarthritis, Clustered Wound: No Paraplegia Photos Wound Measurements Length: (cm) 3 Width: (cm) 1.5 Depth: (cm) 1.4 Area: (cm) 3.534 Volume: (cm) 4.948 % Reduction in Area: -200% % Reduction in Volume: -50% Epithelialization: Small (1-33%) Wound Description Classification: Category/Stage IV Exudate Amount: Medium Exudate Type: Serosanguineous Exudate Color: red, brown Foul Odor After Cleansing: No Slough/Fibrino Yes Wound Bed Granulation Amount: Large (67-100%) Exposed Structure Granulation Quality: Red Fat Layer (Subcutaneous Tissue) Exposed: Yes Necrotic Amount: Small (1-33%) Necrotic Quality: Adherent Slough Treatment Notes Wound #4 (Ischial Tuberosity) Wound Laterality: Left Cleanser Normal Saline Discharge Instruction: Wash your hands with soap and water. Remove old dressing, discard into plastic bag and place into trash. Cleanse the wound with Normal Saline prior to applying a clean dressing using gauze sponges, not tissues or cotton balls. Do not scrub or use excessive force. Pat dry using gauze sponges, not tissue or cotton balls. Wound Cleanser Discharge Instruction: Wash your hands with soap and water. Remove old dressing, discard into plastic bag and place into trash. Cleanse the wound with Wound Cleanser prior to applying a clean dressing using gauze sponges, not tissues or cotton balls. Do not [place], [name] ([id number]) scrub or use excessive force. Pat dry using  gauze sponges, not tissue or cotton balls. Peri-Wound Care Topical Primary Dressing Hydrofera Blue Ready Transfer Foam, 2.5x2.5 (in/in) Discharge Instruction: Apply Hydrofera Blue Ready to wound bed as directed Secondary Dressing (SILCONE BORDER) Zetuvit Plus SILICONE BORDER Dressing 5x5 (in/in) Discharge Instruction: Please do not put silicone bordered dressings under wraps. Use non-bordered dressing only. Secured With Compression Wrap Compression Stockings  Add-Ons Electronic Signature(s) Signed: 04/19/2022 4:46:59 PM By: Levora Dredge Signed: 04/21/2022 8:34:35 AM By: Massie Kluver Entered By: Massie Kluver on 04/19/2022 15:21:10 [name] ([id number]) -------------------------------------------------------------------------------- Vitals Details Patient Name: [name] Date of Service: 04/19/2022 2:15 PM Medical Record Number: [id number] Patient Account Number: [id number] Date of Birth/Sex: 03-14-1966 (56 y.o. M) Treating RN: Levora Dredge Primary Care Detrich Rakestraw: Fulton Reek Other Clinician: Massie Kluver Referring Dari Carpenito: Fulton Reek Treating Eitan Doubleday/Extender: Skipper Cliche in Treatment: 13 Vital Signs Time Taken: 14:30 Temperature (F): 97.7 Height (in): 60 Pulse (bpm): 92 Respiratory Rate (breaths/min): 18 Blood Pressure (mmHg): 102/63 Reference Range: 80 - 120 mg / dl Electronic Signature(s) Signed: 04/21/2022 8:34:35 AM By: Massie Kluver Entered By: Massie Kluver on 04/19/2022 14:32:26

## 2022-04-27 ENCOUNTER — Encounter (HOSPITAL_COMMUNITY): Payer: Self-pay | Admitting: Nurse Practitioner

## 2022-05-10 ENCOUNTER — Encounter: Payer: Medicare Other | Attending: Physician Assistant | Admitting: Physician Assistant

## 2022-05-10 DIAGNOSIS — L89314 Pressure ulcer of right buttock, stage 4: Secondary | ICD-10-CM | POA: Diagnosis present

## 2022-05-10 DIAGNOSIS — L89323 Pressure ulcer of left buttock, stage 3: Secondary | ICD-10-CM | POA: Diagnosis not present

## 2022-05-10 DIAGNOSIS — N183 Chronic kidney disease, stage 3 unspecified: Secondary | ICD-10-CM | POA: Diagnosis not present

## 2022-05-10 DIAGNOSIS — I2510 Atherosclerotic heart disease of native coronary artery without angina pectoris: Secondary | ICD-10-CM | POA: Diagnosis not present

## 2022-05-10 DIAGNOSIS — N319 Neuromuscular dysfunction of bladder, unspecified: Secondary | ICD-10-CM | POA: Insufficient documentation

## 2022-05-10 DIAGNOSIS — I129 Hypertensive chronic kidney disease with stage 1 through stage 4 chronic kidney disease, or unspecified chronic kidney disease: Secondary | ICD-10-CM | POA: Insufficient documentation

## 2022-05-10 DIAGNOSIS — Z933 Colostomy status: Secondary | ICD-10-CM | POA: Insufficient documentation

## 2022-05-10 DIAGNOSIS — G8220 Paraplegia, unspecified: Secondary | ICD-10-CM | POA: Insufficient documentation

## 2022-05-10 DIAGNOSIS — Q760 Spina bifida occulta: Secondary | ICD-10-CM | POA: Insufficient documentation

## 2022-05-10 NOTE — Progress Notes (Signed)
[name], Chase ([id number]) Visit Report for 05/10/2022 Arrival Information Details Patient Name: [name], [name] Date of Service: 05/10/2022 11:15 AM Medical Record Number: [id number] Patient Account Number: [id number] Date of Birth/Sex: [birth year] (56 y.o. M) Treating RN: Cornell Barman Primary Care Anye Brose: Fulton Reek Other Clinician: Massie Kluver Referring Trishelle Devora: Fulton Reek Treating Adaja Wander/Extender: Skipper Cliche in Treatment: 42 Visit Information History Since Last Visit All ordered tests and consults were completed: No Patient Arrived: Wheel Chair Added or deleted any medications: No Arrival Time: 11:35 Any new allergies or adverse reactions: No Transfer Assistance: Transfer Board Had a fall or experienced change in No Patient Requires Transmission-Based Precautions: No activities of daily living that may affect Patient Has Alerts: Yes risk of falls: Patient Alerts: NOT diabetic Signs or symptoms of abuse/neglect since last visito No Hospitalized since last visit: No Pain Present Now: No Electronic Signature(s) Unsigned Entered By: Massie Kluver on 05/10/2022 11:35:39 Signature(s): Date(s): [name] ([id number]) -------------------------------------------------------------------------------- Clinic Level of Care Assessment Details Patient Name: [name], [name] Date of Service: 05/10/2022 11:15 AM Medical Record Number: [id number] Patient Account Number: [id number] Date of Birth/Sex: 07-Mar-1966 (56 y.o. M) Treating RN: Cornell Barman Primary Care Evann Koelzer: Fulton Reek Other Clinician: Massie Kluver Referring Verenice Westrich: Fulton Reek Treating Kamsiyochukwu Spickler/Extender: Skipper Cliche in Treatment: 16 Clinic Level of Care Assessment Items TOOL 1 Quantity Score [] - Use when EandM and Procedure is performed on INITIAL visit 0 ASSESSMENTS - Nursing Assessment / Reassessment [] - General Physical Exam (combine w/ comprehensive assessment  (listed just below) when performed on new 0 pt. evals) [] - 0 Comprehensive Assessment (HX, ROS, Risk Assessments, Wounds Hx, etc.) ASSESSMENTS - Wound and Skin Assessment / Reassessment [] - Dermatologic / Skin Assessment (not related to wound area) 0 ASSESSMENTS - Ostomy and/or Continence Assessment and Care [] - Incontinence Assessment and Management 0 [] - 0 Ostomy Care Assessment and Management (repouching, etc.) PROCESS - Coordination of Care [] - Simple Patient / Family Education for ongoing care 0 [] - 0 Complex (extensive) Patient / Family Education for ongoing care [] - 0 Staff obtains Consents, Records, Test Results / Process Orders [] - 0 Staff telephones HHA, Nursing Homes / Clarify orders / etc [] - 0 Routine Transfer to another Facility (non-emergent condition) [] - 0 Routine Hospital Admission (non-emergent condition) [] - 0 New Admissions / Biomedical engineer / Ordering NPWT, Apligraf, etc. [] - 0 Emergency Hospital Admission (emergent condition) PROCESS - Special Needs [] - Pediatric / Minor Patient Management 0 [] - 0 Isolation Patient Management [] - 0 Hearing / Language / Visual special needs [] - 0 Assessment of Community assistance (transportation, D/C planning, etc.) [] - 0 Additional assistance / Altered mentation [] - 0 Support Surface(s) Assessment (bed, cushion, seat, etc.) INTERVENTIONS - Miscellaneous [] - External ear exam 0 [] - 0 Patient Transfer (multiple staff / Civil Service fast streamer / Similar devices) [] - 0 Simple Staple / Suture removal (25 or less) [] - 0 Complex Staple / Suture removal (26 or more) [] - 0 Hypo/Hyperglycemic Management (do not check if billed separately) [] - 0 Ankle / Brachial Index (ABI) - do not check if billed separately Has the patient been seen at the hospital within the last three years: Yes Total Score: 0 Level Of Care: ____ [name] ([id number]) Electronic Signature(s) Unsigned Entered By:  Massie Kluver on 05/10/2022 12:09:47 Signature(s): Date(s): [name] ([id number]) -------------------------------------------------------------------------------- Encounter Discharge Information Details Patient Name: [name] Date of Service: 05/10/2022 11:15 AM Medical  Record Number: [id number] Patient Account Number: [id number] Date of Birth/Sex: [birth year] (56 y.o. M) Treating RN: Cornell Barman Primary Care Jeri Rawlins: Fulton Reek Other Clinician: Massie Kluver Referring Welma Mccombs: Fulton Reek Treating Safiyyah Vasconez/Extender: Skipper Cliche in Treatment: 16 Encounter Discharge Information Items Post Procedure Vitals Discharge Condition: Stable Temperature (F): 97.8 Ambulatory Status: Wheelchair Pulse (bpm): 76 Discharge Destination: Home Respiratory Rate (breaths/min): 16 Transportation: Other Blood Pressure (mmHg): 106/71 Accompanied By: self Schedule Follow-up Appointment: No Clinical Summary of Care: Electronic Signature(s) Unsigned Entered ByMassie Kluver on 05/10/2022 12:32:18 Signature(s): Date(s): AYDEEN, BLUME ([id number]) -------------------------------------------------------------------------------- Lower Extremity Assessment Details Patient Name: [name], [name] Date of Service: 05/10/2022 11:15 AM Medical Record Number: [id number] Patient Account Number: [id number] Date of Birth/Sex: Dec 09, 1965 (56 y.o. M) Treating RN: Cornell Barman Primary Care Shatiqua Heroux: Fulton Reek Other Clinician: Massie Kluver Referring Yecheskel Kurek: Fulton Reek Treating Yarisa Lynam/Extender: Skipper Cliche in Treatment: 16 Electronic Signature(s) Unsigned Entered By: Massie Kluver on 05/10/2022 12:02:20 Signature(s): Date(s): CALOGERO, GEISEN ([id number]) -------------------------------------------------------------------------------- Multi Wound Chart Details Patient Name: [name] Date of Service: 05/10/2022 11:15 AM Medical Record Number:  [id number] Patient Account Number: [id number] Date of Birth/Sex: 03-May-1966 (56 y.o. M) Treating RN: Cornell Barman Primary Care Camryn Quesinberry: Fulton Reek Other Clinician: Massie Kluver Referring Karlene Southard: Fulton Reek Treating Garlon Tuggle/Extender: Skipper Cliche in Treatment: 16 Vital Signs Height(in): 60 Pulse(bpm): 28 Weight(lbs): Blood Pressure(mmHg): 106/71 Body Mass Index(BMI): Temperature(F): 97.8 Respiratory Rate(breaths/min): 16 Photos: [4:No Photos] [N/A:N/A] Wound Location: Right Ischial Tuberosity Left Ischial Tuberosity N/A Wounding Event: Gradually Appeared Gradually Appeared N/A Primary Etiology: Pressure Ulcer Pressure Ulcer N/A Comorbid History: Cataracts, Glaucoma, Coronary Cataracts, Glaucoma, Coronary N/A Artery Disease, Hypertension, Artery Disease, Hypertension, History of pressure wounds, History of pressure wounds, Osteoarthritis, Paraplegia Osteoarthritis, Paraplegia Date Acquired: 09/03/2021 01/29/2022 N/A Weeks of Treatment: 16 13 N/A Wound Status: Open Open N/A Wound Recurrence: No No N/A Measurements L x W x D (cm) 2.5x4.8x0.6 0.4x1.4x1 N/A Area (cm) : 9.425 0.44 N/A Volume (cm) : 5.655 0.44 N/A % Reduction in Area: 46.40% 62.60% N/A % Reduction in Volume: 87.10% 86.70% N/A Classification: Category/Stage IV Category/Stage IV N/A Exudate Amount: Large Medium N/A Exudate Type: Serosanguineous Serosanguineous N/A Exudate Color: red, brown red, brown N/A Granulation Amount: Large (67-100%) Large (67-100%) N/A Granulation Quality: Red, Pink Red N/A Necrotic Amount: Small (1-33%) Small (1-33%) N/A Necrotic Tissue: Eschar, Adherent [place] N/A Exposed Structures: Fat Layer (Subcutaneous Tissue): Fat Layer (Subcutaneous Tissue): N/A Yes Yes Epithelialization: None Small (1-33%) N/A Treatment Notes Electronic Signature(s) Unsigned Entered By: Massie Kluver on 05/10/2022 12:02:30 Signature(s): Date(s): FATEH, KINDLE  ([id number]) -------------------------------------------------------------------------------- [place] Details Patient Name: [name], [name] Date of Service: 05/10/2022 11:15 AM Medical Record Number: [id number] Patient Account Number: [id number] Date of Birth/Sex: 1966/02/24 (56 y.o. M) Treating RN: Cornell Barman Primary Care Socorro Ebron: Fulton Reek Other Clinician: Massie Kluver Referring Cambre Matson: Fulton Reek Treating Zealand Boyett/Extender: Skipper Cliche in Treatment: 16 Active Inactive Pressure Nursing Diagnoses: Knowledge deficit related to causes and risk factors for pressure ulcer development Knowledge deficit related to management of pressures ulcers Potential for impaired tissue integrity related to pressure, friction, moisture, and shear Goals: Patient will remain free from development of additional pressure ulcers Date Initiated: 01/15/2022 Target Resolution Date: 02/26/2022 Goal Status: Active Patient/caregiver will verbalize risk factors for pressure ulcer development Date Initiated: 01/15/2022 Target Resolution Date: 03/05/2022 Goal Status: Active Interventions: Assess: immobility, friction, shearing, incontinence upon admission and as needed Assess offloading mechanisms upon admission and as needed Assess potential for pressure ulcer upon admission and as needed  Notes: Electronic Signature(s) Unsigned Entered By: Massie Kluver on 05/10/2022 12:02:23 Signature(s): Date(s): GARRY, NICOLINI ([id number]) -------------------------------------------------------------------------------- Pain Assessment Details Patient Name: JAASIEL, HOLLYFIELD Date of Service: 05/10/2022 11:15 AM Medical Record Number: [id number] Patient Account Number: [id number] Date of Birth/Sex: January 24, 1966 (56 y.o. M) Treating RN: Cornell Barman Primary Care : Fulton Reek Other Clinician: Massie Kluver Referring : Fulton Reek Treating /Extender:  Skipper Cliche in Treatment: 16 Active Problems Location of Pain Severity and Description of Pain Patient Has Paino No Site Locations Pain Management and Medication Current Pain Management: Electronic Signature(s) Unsigned Entered By: Massie Kluver on 05/10/2022 11:38:40 Signature(s): Date(s): [name] ([id number]) -------------------------------------------------------------------------------- Patient/Caregiver Education Details Patient Name: [name] Date of Service: 05/10/2022 11:15 AM Medical Record Number: [id number] Patient Account Number: [id number] Date of Birth/Gender: [birth year] (56 y.o. M) Treating RN: Cornell Barman Primary Care Physician: Fulton Reek Other Clinician: Massie Kluver Referring Physician: Fulton Reek Treating Physician/Extender: Skipper Cliche in Treatment: 16 Education Assessment Education Provided To: Patient Education Topics Provided Wound/Skin Impairment: Handouts: Other: continue wound care as directed Methods: Explain/Verbal Responses: State content correctly Electronic Signature(s) Unsigned Entered By: Massie Kluver on 05/10/2022 12:31:25 Signature(s): Date(s): KYJUAN, [name] ([id number]) -------------------------------------------------------------------------------- Wound Assessment Details Patient Name: [name], [name] Date of Service: 05/10/2022 11:15 AM Medical Record Number: [id number] Patient Account Number: [id number] Date of Birth/Sex: August 29, 1966 (56 y.o. M) Treating RN: Cornell Barman Primary Care : Fulton Reek Other Clinician: Massie Kluver Referring : Fulton Reek Treating /Extender: Skipper Cliche in Treatment: 16 Wound Status Wound Number: 3 Primary Pressure Ulcer Etiology: Wound Location: Right Ischial Tuberosity Wound Open Wounding Event: Gradually Appeared Status: Date Acquired: 09/03/2021 Comorbid Cataracts, Glaucoma, Coronary Artery Disease, Weeks  Of Treatment: 16 History: Hypertension, History of pressure wounds, Osteoarthritis, Clustered Wound: No Paraplegia Photos Wound Measurements Length: (cm) 2.5 Width: (cm) 4.8 Depth: (cm) 0.6 Area: (cm) 9.425 Volume: (cm) 5.655 % Reduction in Area: 46.4% % Reduction in Volume: 87.1% Epithelialization: None Tunneling: No Undermining: No Wound Description Classification: Category/Stage IV Exudate Amount: Large Exudate Type: Serosanguineous Exudate Color: red, brown Foul Odor After Cleansing: No Slough/Fibrino Yes Wound Bed Granulation Amount: Large (67-100%) Exposed Structure Granulation Quality: Red, Pink Fat Layer (Subcutaneous Tissue) Exposed: Yes Necrotic Amount: Small (1-33%) Necrotic Quality: Eschar, Adherent Slough Treatment Notes Wound #3 (Ischial Tuberosity) Wound Laterality: Right Cleanser [place] Ancillary Kit - 15 Day Supply Discharge Instruction: Use supplies as instructed; Kit contains: (15) Saline Bullets; (15) 3x3 Gauze; 15 pr Gloves Normal Saline Discharge Instruction: Wash your hands with soap and water. Remove old dressing, discard into plastic bag and place into trash. Cleanse the wound with Normal Saline prior to applying a clean dressing using gauze sponges, not tissues or cotton balls. Do not scrub or use excessive force. Pat dry using gauze sponges, not tissue or cotton balls. Wound Cleanser SHERVIN, CYPERT ([id number]) Discharge Instruction: Wash your hands with soap and water. Remove old dressing, discard into plastic bag and place into trash. Cleanse the wound with Wound Cleanser prior to applying a clean dressing using gauze sponges, not tissues or cotton balls. Do not scrub or use excessive force. Pat dry using gauze sponges, not tissue or cotton balls. Peri-Wound Care Topical Primary Dressing Gauze Discharge Instruction: moistened with dakins Secondary Dressing ABD Pad 5x9 (in/in) Discharge Instruction: Cover with ABD pad Secured  With Medipore Tape - 74M Medipore H Soft Cloth Surgical Tape, 2x2 (in/yd) Compression Wrap Compression Stockings Add-Ons Electronic Signature(s) Unsigned Entered By: Massie Kluver on 05/10/2022 11:54:50 Signature(s): Date(s): [name] ([id number]) -------------------------------------------------------------------------------- Wound  Assessment Details Patient Name: [name], [name] Date of Service: 05/10/2022 11:15 AM Medical Record Number: [id number] Patient Account Number: [id number] Date of Birth/Sex: Jul 13, 1966 (56 y.o. M) Treating RN: Cornell Barman Primary Care : Fulton Reek Other Clinician: Massie Kluver Referring : Fulton Reek Treating /Extender: Skipper Cliche in Treatment: 16 Wound Status Wound Number: 4 Primary Pressure Ulcer Etiology: Wound Location: Left Ischial Tuberosity Wound Open Wounding Event: Gradually Appeared Status: Date Acquired: 01/29/2022 Comorbid Cataracts, Glaucoma, Coronary Artery Disease, Weeks Of Treatment: 13 History: Hypertension, History of pressure wounds, Osteoarthritis, Clustered Wound: No Paraplegia Wound Measurements Length: (cm) 0.4 Width: (cm) 1.4 Depth: (cm) 1 Area: (cm) 0.44 Volume: (cm) 0.44 % Reduction in Area: 62.6% % Reduction in Volume: 86.7% Epithelialization: Small (1-33%) Wound Description Classification: Category/Stage IV Exudate Amount: Medium Exudate Type: Serosanguineous Exudate Color: red, brown Foul Odor After Cleansing: No Slough/Fibrino Yes Wound Bed Granulation Amount: Large (67-100%) Exposed Structure Granulation Quality: Red Fat Layer (Subcutaneous Tissue) Exposed: Yes Necrotic Amount: Small (1-33%) Necrotic Quality: Adherent Slough Treatment Notes Wound #4 (Ischial Tuberosity) Wound Laterality: Left Cleanser Normal Saline Discharge Instruction: Wash your hands with soap and water. Remove old dressing, discard into plastic bag and place into  trash. Cleanse the wound with Normal Saline prior to applying a clean dressing using gauze sponges, not tissues or cotton balls. Do not scrub or use excessive force. Pat dry using gauze sponges, not tissue or cotton balls. Wound Cleanser Discharge Instruction: Wash your hands with soap and water. Remove old dressing, discard into plastic bag and place into trash. Cleanse the wound with Wound Cleanser prior to applying a clean dressing using gauze sponges, not tissues or cotton balls. Do not scrub or use excessive force. Pat dry using gauze sponges, not tissue or cotton balls. Peri-Wound Care Topical Primary Dressing Hydrofera Blue Ready Transfer Foam, 2.5x2.5 (in/in) Discharge Instruction: Apply Hydrofera Blue Ready to wound bed as directed Secondary Dressing (SILCONE BORDER) Zetuvit Plus SILICONE BORDER Dressing 5x5 (in/in) Discharge Instruction: Please do not put silicone bordered dressings under wraps. Use non-bordered dressing only. Secured With [place] ([id number]) Compression Wrap Compression Stockings Add-Ons Electronic Signature(s) Unsigned Entered By: Massie Kluver on 05/10/2022 12:02:11 Signature(s): Date(s): RUSSELL, QUINNEY ([id number]) -------------------------------------------------------------------------------- Vitals Details Patient Name: [name] Date of Service: 05/10/2022 11:15 AM Medical Record Number: [id number] Patient Account Number: [id number] Date of Birth/Sex: 28-Mar-1966 (56 y.o. M) Treating RN: Cornell Barman Primary Care : Fulton Reek Other Clinician: Massie Kluver Referring : Fulton Reek Treating /Extender: Skipper Cliche in Treatment: 16 Vital Signs Time Taken: 11:36 Temperature (F): 97.8 Height (in): 60 Pulse (bpm): 76 Respiratory Rate (breaths/min): 16 Blood Pressure (mmHg): 106/71 Reference Range: 80 - 120 mg / dl Electronic Signature(s) Unsigned Entered ByMassie Kluver on  05/10/2022 11:38:35 Signature(s): Date(s):

## 2022-05-10 NOTE — Progress Notes (Signed)
[name], [name] ([id number]) Visit Report for 05/10/2022 Chief Complaint Document Details Patient Name: [name], [name] Date of Service: 05/10/2022 11:15 AM Medical Record Number: [id number] Patient Account Number: [id number] Date of Birth/Sex: 11-11-1965 (56 y.o. M) Treating RN: Cornell Barman Primary Care Provider: Fulton Reek Other Clinician: Massie Kluver Referring Provider: Fulton Reek Treating Provider/Extender: Skipper Cliche in Treatment: 16 Information Obtained from: Patient Chief Complaint Right ischial tuberosity pressure ulcer Electronic Signature(s) Signed: 05/10/2022 11:05:00 AM By: Worthy Keeler PA-C Entered By: Worthy Keeler on 05/10/2022 11:05:00 [name] ([id number]) -------------------------------------------------------------------------------- Debridement Details Patient Name: [name] Date of Service: 05/10/2022 11:15 AM Medical Record Number: [id number] Patient Account Number: [id number] Date of Birth/Sex: [birth year] (55 y.o. M) Treating RN: Cornell Barman Primary Care Provider: Fulton Reek Other Clinician: Massie Kluver Referring Provider: Fulton Reek Treating Provider/Extender: Skipper Cliche in Treatment: 16 Debridement Performed for Wound #3 Right Ischial Tuberosity Assessment: Performed By: Physician Tommie Sams., PA-C Debridement Type: Debridement Level of Consciousness (Pre- Awake and Alert procedure): Pre-procedure Verification/Time Out Yes - 12:01 Taken: Start Time: 12:01 Total Area Debrided (L x W): 2.5 (cm) x 4.8 (cm) = 12 (cm) Tissue and other material Viable, Non-Viable, Slough, Subcutaneous, Slough debrided: Level: Skin/Subcutaneous Tissue Debridement Description: Excisional Instrument: Curette Bleeding: Minimum Hemostasis Achieved: Pressure End Time: 12:06 Response to Treatment: Procedure was tolerated well Level of Consciousness (Post- Awake and Alert procedure): Post Debridement  Measurements of Total Wound Length: (cm) 2.5 Stage: Category/Stage IV Width: (cm) 4.8 Depth: (cm) 0.6 Volume: (cm) 5.655 Character of Wound/Ulcer Post Debridement: Stable Post Procedure Diagnosis Same as Pre-procedure Electronic Signature(s) Unsigned Entered By: Massie Kluver on 05/10/2022 12:07:50 Signature(s): Date(s): [name] ([id number]) -------------------------------------------------------------------------------- Physician Orders Details Patient Name: [name] Date of Service: 05/10/2022 11:15 AM Medical Record Number: [id number] Patient Account Number: [id number] Date of Birth/Sex: 1966-03-12 (56 y.o. M) Treating RN: Cornell Barman Primary Care Provider: Fulton Reek Other Clinician: Massie Kluver Referring Provider: Fulton Reek Treating Provider/Extender: Skipper Cliche in Treatment: 33 Verbal / Phone Orders: No Diagnosis Coding ICD-10 Coding Code Description L89.314 Pressure ulcer of right buttock, stage 4 L89.323 Pressure ulcer of left buttock, stage 3 Q76.0 Spina bifida occulta Z93.3 Colostomy status I10 Essential (primary) hypertension I25.10 Atherosclerotic heart disease of native coronary artery without angina pectoris Follow-up Appointments o Return Appointment in 2 weeks. o Nurse Visit as needed [place]: - [facility] o ADMIT to [facility] for wound care. May utilize formulary equivalent dressing for wound treatment orders unless otherwise specified. Home Health Nurse may visit [facility] to address patientos wound care needs. o Scheduled days for dressing changes to be completed; exception, patient has scheduled wound care visit that day. o **Please direct any NON-WOUND related issues/requests for orders to patient's Primary Care Physician. **If current dressing causes regression in wound condition, may D/C ordered dressing product/s and apply Normal Saline Moist Dressing daily until next [place] or Other MD appointment. **Notify [facility] of regression in wound condition at [phone]. o Other Home Health Orders/Instructions: - Black dot written on patients hip area where PA Stone would like wound vac bridged up to please. Bathing/ Shower/ Hygiene o Clean wound with Normal Saline or wound cleanser. - keep dressing dry or change after shower o No tub bath. Off-Loading o Gel wheelchair cushion o Hospital bed/mattress - Has at home o Air fluidized (Group 3) - Has from [employer] and reposition every 2 hours - Do not sit in wheelchair any  longer than you have to--offload wound in bed as much as possible Additional Orders / Instructions o Follow Nutritious Diet and Increase Protein Intake o Other: - Refer to ostomy clinic in [facility] if desired Negative Pressure Wound Therapy Wound #3 Right Ischial Tuberosity o Wound VAC settings at 152mHg continuous pressure. Use foam to wound cavity. Please order WHITE foam to fill any tunnel/s and/or undermining when necessary. Change VAC dressing 3 X WEEK. Change canister as indicated when full. - Change Monday, Wednesday, and Friday o Home Health Nurse may d/c VAC for s/s of increased infection, significant wound regression, or uncontrolled drainage. [place] [phone] o Number of foam/gauze pieces used in the dressing = - 1 piece of black foam place into the wound bed. This needs to be bridged to the side of the abdomen/hip area so that the patient will not be sitting on the suction pad and tubing when up in his chair. This will also help the suction pad to stay in place. Make sure to drape skin so the black foam is not touching the skin o Other: - Please do not remove wound vac prior to wound clinic visit so we can assess application Medications-Please add to medication list. o P.O. Antibiotics - PLEASE pick up you called in antibiotic and start ASAP Wound  Treatment [name], [name]([id number] Wound #3 - Ischial Tuberosity Wound Laterality: Right Cleanser: [place] Ancillary Kit - 15 Day Supply (Generic) 1 x Per Day/30 Days Discharge Instructions: Use supplies as instructed; Kit contains: (15) Saline Bullets; (15) 3x3 Gauze; 15 pr Gloves Cleanser: Normal Saline 1 x Per Day/30 Days Discharge Instructions: Wash your hands with soap and water. Remove old dressing, discard into plastic bag and place into trash. Cleanse the wound with Normal Saline prior to applying a clean dressing using gauze sponges, not tissues or cotton balls. Do not scrub or use excessive force. Pat dry using gauze sponges, not tissue or cotton balls. Cleanser: Wound Cleanser 1 x Per Day/30 Days Discharge Instructions: Wash your hands with soap and water. Remove old dressing, discard into plastic bag and place into trash. Cleanse the wound with Wound Cleanser prior to applying a clean dressing using gauze sponges, not tissues or cotton balls. Do not scrub or use excessive force. Pat dry using gauze sponges, not tissue or cotton balls. Primary Dressing: Gauze (Generic) 1 x Per Day/30 Days Discharge Instructions: moistened with dakins Secondary Dressing: ABD Pad 5x9 (in/in) (Generic) 1 x Per Day/30 Days Discharge Instructions: Cover with ABD pad Secured With: Medipore Tape - 43M Medipore H Soft Cloth Surgical Tape, 2x2 (in/yd) (Generic) 1 x Per Day/30 Days Wound #4 - Ischial Tuberosity Wound Laterality: Left Cleanser: Normal Saline 3 x Per Week/30 Days Discharge Instructions: Wash your hands with soap and water. Remove old dressing, discard into plastic bag and place into trash. Cleanse the wound with Normal Saline prior to applying a clean dressing using gauze sponges, not tissues or cotton balls. Do not scrub or use excessive force. Pat dry using gauze sponges, not tissue or cotton balls. Cleanser: Wound Cleanser 3 x Per Week/30 Days Discharge Instructions: Wash your hands with  soap and water. Remove old dressing, discard into plastic bag and place into trash. Cleanse the wound with Wound Cleanser prior to applying a clean dressing using gauze sponges, not tissues or cotton balls. Do not scrub or use excessive force. Pat dry using gauze sponges, not tissue or cotton balls. Primary Dressing: Hydrofera Blue Ready Transfer Foam, 2.5x2.5 (  in/in) 3 x Per Week/30 Days Discharge Instructions: Apply Hydrofera Blue Ready to wound bed as directed Secondary Dressing: (SILCONE BORDER) Zetuvit Plus SILICONE BORDER Dressing 5x5 (in/in) 3 x Per Week/30 Days Discharge Instructions: Please do not put silicone bordered dressings under wraps. Use non-bordered dressing only. Electronic Signature(s) Unsigned Entered By: Massie Kluver on 05/10/2022 12:09:41 Signature(s): Date(s): [name], [name] ([id number]) -------------------------------------------------------------------------------- Problem List Details Patient Name: [name], [name] Date of Service: 05/10/2022 11:15 AM Medical Record Number: [id number] Patient Account Number: [id number] Date of Birth/Sex: May 05, 1966 (56 y.o. M) Treating RN: Cornell Barman Primary Care Provider: Fulton Reek Other Clinician: Massie Kluver Referring Provider: Fulton Reek Treating Provider/Extender: Skipper Cliche in Treatment: 16 Active Problems ICD-10 Encounter Code Description Active Date MDM Diagnosis L89.314 Pressure ulcer of right buttock, stage 4 01/15/2022 No Yes L89.323 Pressure ulcer of left buttock, stage 3 02/05/2022 No Yes Q76.0 Spina bifida occulta 01/15/2022 No Yes Z93.3 Colostomy status 01/15/2022 No Yes I10 Essential (primary) hypertension 01/15/2022 No Yes I25.10 Atherosclerotic heart disease of native coronary artery without angina 01/15/2022 No Yes pectoris Inactive Problems Resolved Problems Electronic Signature(s) Signed: 05/10/2022 11:04:54 AM By: Worthy Keeler PA-C Entered By: Worthy Keeler on 05/10/2022  11:04:54

## 2022-05-24 ENCOUNTER — Encounter: Payer: Medicare Other | Admitting: Physician Assistant

## 2022-05-24 DIAGNOSIS — L89314 Pressure ulcer of right buttock, stage 4: Secondary | ICD-10-CM | POA: Diagnosis not present

## 2022-05-24 NOTE — Progress Notes (Signed)
[name], [name] ([id number]) Visit Report for 05/24/2022 Chief Complaint Document Details Patient Name: [name], [name] Date of Service: 05/24/2022 1:30 PM Medical Record Number: [id number] Patient Account Number: [id number] Date of Birth/Sex: [birth year] (56 y.o. M) Treating RN: Carlene Coria Primary Care Provider: Fulton Reek Other Clinician: Massie Kluver Referring Provider: Fulton Reek Treating Provider/Extender: Skipper Cliche in Treatment: 18 Information Obtained from: Patient Chief Complaint Right ischial tuberosity pressure ulcer Electronic Signature(s) Signed: 05/24/2022 1:51:07 PM By: Worthy Keeler PA-C Entered By: Worthy Keeler on 05/24/2022 13:51:07 [name] ([id number]) -------------------------------------------------------------------------------- Debridement Details Patient Name: [name] Date of Service: 05/24/2022 1:30 PM Medical Record Number: [id number] Patient Account Number: [id number] Date of Birth/Sex: Nov 04, 1965 (55 y.o. M) Treating RN: Carlene Coria Primary Care Provider: Fulton Reek Other Clinician: Massie Kluver Referring Provider: Fulton Reek Treating Provider/Extender: Skipper Cliche in Treatment: 18 Debridement Performed for Wound #3 Right Ischial Tuberosity Assessment: Performed By: Physician Tommie Sams., PA-C Debridement Type: Debridement Level of Consciousness (Pre- Awake and Alert procedure): Pre-procedure Verification/Time Out Yes - 02:09 Taken: Start Time: 02:09 Total Area Debrided (L x W): 2.5 (cm) x 4.6 (cm) = 11.5 (cm) Tissue and other material Viable, Non-Viable, Slough, Subcutaneous, Slough debrided: Level: Skin/Subcutaneous Tissue Debridement Description: Excisional Instrument: Curette Bleeding: Minimum Hemostasis Achieved: Pressure End Time: 02:13 Response to Treatment: Procedure was tolerated well Level of Consciousness (Post- Awake and Alert procedure): Post Debridement  Measurements of Total Wound Length: (cm) 2.5 Stage: Category/Stage IV Width: (cm) 4.6 Depth: (cm) 0.3 Volume: (cm) 2.71 Character of Wound/Ulcer Post Debridement: Stable Post Procedure Diagnosis Same as Pre-procedure Electronic Signature(s) Unsigned Entered By: Massie Kluver on 05/24/2022 14:13:59 Signature(s): Date(s): [name] ([id number]) -------------------------------------------------------------------------------- HPI Details Patient Name: [name] Date of Service: 05/24/2022 1:30 PM Medical Record Number: [id number] Patient Account Number: [id number] Date of Birth/Sex: [birth year] (56 y.o. M) Treating RN: Carlene Coria Primary Care Provider: Fulton Reek Other Clinician: Massie Kluver Referring Provider: Fulton Reek Treating Provider/Extender: Skipper Cliche in Treatment: 18 History of Present Illness HPI Description: 56 year old male with a history of spina bifida presenting to [place] with a history of a open wound on the left gluteal region near his upper thigh which she's had for several weeks. He was seen in the ED recently at [facility] and was put on doxycycline and was advised some local care. his past medical history is significant for spinal bifid, neurogenic bladder, kidney stones, sacral pressure sores, constipation. Past surgical history significant for partial cystectomy, ileal conduit, VP shunt removal, percutaneous nephro lithotripsy. In the remote past the patient says he's had some treatment for a ulcer on this area and was treated with a skin graft. Readmission: 10/16/2021 upon evaluation today patient appears to be doing somewhat poorly in regard to a fairly large wound noted over the right ischial tuberosity location. This is a stage IV pressure ulcer. Of note this is also positive for osteomyelitis upon a CT scan which was performed during the time that he was admitted in the hospital on 09/19/2021. With that being said  it is noted in the right inferior buttock that he has a decubitus ulcer which extends to the posterior toe inferior right ischium where there is evidence of osteomyelitis. When he was here in the office actually missed that this was positive I missed read it and thought it said that there was no osteomyelitis. That is not the case there is in fact osteomyelitis noted here. I actually did review his notes as well in epic.  Looking to the discharge summary it appears that the patient was admitted from 09/19/2021 through 09/23/2021. He was discharged home with home health care according to the note. With that being said from what I understand his sister actually takes care of him at this point. He was also to follow-up with a primary care provider and here at the wound care center within a week. I am just now seeing him on the 13th almost a month after discharge. He does have a history of again osteomyelitis of this right hip/ischial tuberosity location. 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 The reason for his admission was actually worsening of the decubitus ulcer upon admission. He does have chronic osteomyelitis of this area. He was treated with empiric antibiotics he had acute kidney injury which improved with IV fluids he also had hypokalemia. Surgical debridement was performed by general surgery at that time and ID was consulted to assist with management according to the notes. IV antibiotics were recommended but patient declined and wanted oral antibiotics at discharge. Therefore no IV antibiotics were planned for discharge time. This case was under the care of Dr. Steva Ready while he was in the hospital when she did discharge him with a 2-week course of doxycycline and Augmentin according to the notes. It looks like Dr. Doy Hutching office did call and  leave a message for the patient to get scheduled for a follow-up visit after hospital discharge I do not see where he showed up in fact it appears that the patient has a no- show letter from Dr. Doy Hutching office noted in epic due to a appointment that was missed on 10/06/2021. Looking at the pictures from Dr. Raelene Bott notes on 09/23/2021 it does appear that the wound is a little bit better as far as the cleanliness of the surface of the wound today but nonetheless still is a very significant wound. 10/26/2021 upon evaluation today patient appears to be doing okay in regard to his wound. Again this is a wound that he did indeed have osteomyelitis and previous. With that being said I do not see any signs of a worsening infection at this time which is good news. Overall I think that we are headed in the right direction although still I think he needs more aggressive offloading. 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 I do believe this would be ideal for him to be honest. Readmission: 01-15-2022 upon evaluation today patient presents for reevaluation he was last seen on October 26, 2021. At that time unfortunately he was having issues with a significant ischial pressure ulcer on the right ischial tuberosity. Subsequently he ended up having decent response to the Dakin's moistened gauze dressing that was previously being utilized and subsequently the patient's sister who is his primary caregiver as well states that she decided just to try to manage this at home. Nonetheless unfortunately this is getting a little larger not better. He does spend a lot of the day in his chair. He does have an air mattress according what they tell me today. That was previously ordered. With that being said I do believe that at this time things do appear to be doing much better from the standpoint of infection I do not see any signs of overt infection. There is also no evidence of systemic  infection.  No fevers, chills, nausea, vomiting, or diarrhea. With that being said I do believe based on what I am seeing the wound is a bit cleaner and he possibly could be an excellent candidate for a wound VAC. 02-05-2022 upon evaluation today patient appears to be doing about the same in regard to his wound. Unfortunately he is not any better but he also really has not had the wound VAC on sufficiently. There is been some confusion with the orders part of that I think is our follow-up with the way the order was written part of it may be with how the wound VAC is being placed either way we need to see what we can do to try to improve things in this regard. We will clarify the orders today going forward for home health. Unfortunately the patient does have a new wound today which is on the opposite side. This is not good 1 was bad enough have another area to open is definitely not a good thing. Its not as deep but nonetheless is also not very shallow either. We may potentially be able to get this to the point that we could bridge the 2 together in order to wound VAC both but right now want a focus on to try to get the wound VAC going for the main wound initially we will likely use Hydrofera Blue on the new wound.Marland Kitchen 02-19-2022 upon evaluation today patient's wound appears to be doing a little bit worse in the way of maceration fortunately there is no signs of active infection locally or systemically which is great news. No fevers, chills, nausea, vomiting, or diarrhea. 03-05-2022 upon evaluation today patient unfortunately does not appear to be doing nearly as good in regard to his wounds as I would like to see. Fortunately I do not see any evidence of active infection that is obvious although I am can obtain a wound culture to ensure that there is not anything getting worse here as far as the wound without the wound VAC on his concern. With that being said I will go ahead and get this sent in and evaluated will  initiate treatment as needed going forward if necessary. With regard to the wound with the wound VAC this continues to be placed directly over the wound and there is obvious signs of deep tissue injury. The suction being here means that he is sitting on it I think this PAPA, PIERCEFIELD ([id number]) may be part of the reason why it is coming off although not 182% certain. 03-12-2022 upon evaluation today patient still has not gotten the antibiotics that were called in for him 4 days ago. He tells me that his sister just told him this morning that they were ready but that she has not had a chance to go pick them up. Nonetheless I feel like that this is really something he needs any should have been taken that not the other antibiotics which were not doing the job for him to be honest. The patient states and I completely agree that there is really no way he would have known relying on his sister to let him know obviously. Nonetheless I do believe that he needs to not be taking any other antibiotics and be on the Levaquin as soon as possible. He tells me that she said she was going to pick them up today. In regard to home health I am hopeful that they actually have been bridging this now. I do think that the wound looks  better and this is good news. With that being said I am not certain that I can really tell for sure how this was attached it was removed before he came in and the black foam left in place. I really want him to leave the wound VAC in place until he comes in so that we can monitor and make sure that this is being done correctly he voiced understanding. 03-19-2022 upon evaluation today and much happier with how things appear as far as the patient's wounds are concerned. I think he is on a much better track and they are doing a better job at bridging although its not quite where I like to see it is still more posterior which I think the patient needs to be a little bit more lateral on his side.  Either way this is something that I did marked with a small dot today for him to tell the home health nurses well so she can bridge it to that location also think the tubing should go up instead of going down to just make it less cumbersome overall for him. 04-19-2022 upon evaluation today patient appears to be doing a little better in regard to his wounds. Fortunately I do not see any signs of infection I do believe he is on the right track. The wound VAC does seem to be helping to clean the wound up a bit and bring it in from the base at work. This is good news. 05-10-2022 upon evaluation today patient appears to be doing well currently in regard to his wounds in general in fact of the smaller of the 2 wounds without the wound VAC actually appears to be doing so much better this is almost completely closed and very pleased in that regard. He has been staying off of this he tells me and shows. With that being said the wound with the wound VAC is going require some sharp debridement there is some tissue which is not quite as healthy and we are going to go ahead and continue that for probably 2 more weeks although I think we may be closer to discontinuing this as well since it has healed and quite significantly. 05-24-2022 upon evaluation today patient appears to be doing well with regard to his wound in fact this looks better than I have been expected. This actually I think is a good time to go ahead and switch away from the wound VAC based on what I am seeing. I think we go to [facility] with a bordered foam dressing. Electronic Signature(s) Signed: 05/24/2022 2:47:16 PM By: Worthy Keeler PA-C Entered By: Worthy Keeler on 05/24/2022 14:47:16 [name] ([id number]) -------------------------------------------------------------------------------- Physical Exam Details Patient Name: [name] Date of Service: 05/24/2022 1:30 PM Medical Record Number: [id number] Patient Account  Number: [id number] Date of Birth/Sex: 01/08/1966 (55 y.o. M) Treating RN: Carlene Coria Primary Care Provider: Fulton Reek Other Clinician: Massie Kluver Referring Provider: Fulton Reek Treating Provider/Extender: Skipper Cliche in Treatment: 21 Constitutional Well-nourished and well-hydrated in no acute distress. Respiratory normal breathing without difficulty. Psychiatric this patient is able to make decisions and demonstrates good insight into disease process. Alert and Oriented x 3. pleasant and cooperative. Notes Upon inspection patient's wound bed actually showed signs of excellent granulation there was some need for sharp debridement I did perform debridement today to clearway some of the necrotic debris and the patient tolerated this without complication. Postdebridement the wound bed appears to be doing much better. Electronic Signature(s) Signed:  05/24/2022 2:47:34 PM By: Worthy Keeler PA-C Entered By: Worthy Keeler on 05/24/2022 14:47:34 [name] ([id number]) -------------------------------------------------------------------------------- Physician Orders Details Patient Name: [name] Date of Service: 05/24/2022 1:30 PM Medical Record Number: [id number] Patient Account Number: [id number] Date of Birth/Sex: 1966/04/10 (55 y.o. M) Treating RN: Carlene Coria Primary Care Provider: Fulton Reek Other Clinician: Massie Kluver Referring Provider: Fulton Reek Treating Provider/Extender: Skipper Cliche in Treatment: 71 Verbal / Phone Orders: No Diagnosis Coding ICD-10 Coding Code Description L89.314 Pressure ulcer of right buttock, stage 4 L89.323 Pressure ulcer of left buttock, stage 3 Q76.0 Spina bifida occulta Z93.3 Colostomy status I10 Essential (primary) hypertension I25.10 Atherosclerotic heart disease of native coronary artery without angina pectoris Follow-up Appointments o Return Appointment in 2 weeks. o Nurse Visit  as needed [place]: - [facility] o ADMIT to [facility] for wound care. May utilize formulary equivalent dressing for wound treatment orders unless otherwise specified. Home Health Nurse may visit PRN to address patientos wound care needs. o Scheduled days for dressing changes to be completed; exception, patient has scheduled wound care visit that day. o **Please direct any NON-WOUND related issues/requests for orders to patient's Primary Care Physician. **If current dressing causes regression in wound condition, may D/C ordered dressing product/s and apply Normal Saline Moist Dressing daily until next [facility] or Other MD appointment. **Notify [facility] of regression in wound condition at [phone]. Bathing/ Shower/ Hygiene o Clean wound with Normal Saline or wound cleanser. - keep dressing dry or change after shower o No tub bath. Off-Loading o Gel wheelchair cushion o Hospital bed/mattress - Has at home o Air fluidized (Group 3) - Has from [employer] and reposition every 2 hours - Do not sit in wheelchair any longer than you have to--offload wound in bed as much as possible Additional Orders / Instructions o Follow Nutritious Diet and Increase Protein Intake o Other: - Refer to ostomy clinic in [facility] if desired Negative Pressure Wound Therapy Wound #3 Right Ischial Tuberosity o Wound VAC settings at 143mHg continuous pressure. Use foam to wound cavity. Please order WHITE foam to fill any tunnel/s and/or undermining when necessary. Change VAC dressing 3 X WEEK. Change canister as indicated when full. - Change Monday, Wednesday, and Friday Medications-Please add to medication list. o P.O. Antibiotics - PLEASE pick up you called in antibiotic and start ASAP Wound Treatment Wound #3 - Ischial Tuberosity Wound Laterality: Right Cleanser: Byram Ancillary Kit - 15 Day Supply (Generic) 3 x Per Week/30  Days Discharge Instructions: Use supplies as instructed; Kit contains: (15) Saline Bullets; (15) 3x3 Gauze; 15 pr Gloves Cleanser: Normal Saline 3 x Per Week/30 Days [name], [name]([id number] Discharge Instructions: Wash your hands with soap and water. Remove old dressing, discard into plastic bag and place into trash. Cleanse the wound with Normal Saline prior to applying a clean dressing using gauze sponges, not tissues or cotton balls. Do not scrub or use excessive force. Pat dry using gauze sponges, not tissue or cotton balls. Cleanser: Wound Cleanser 3 x Per Week/30 Days Discharge Instructions: Wash your hands with soap and water. Remove old dressing, discard into plastic bag and place into trash. Cleanse the wound with Wound Cleanser prior to applying a clean dressing using gauze sponges, not tissues or cotton balls. Do not scrub or use excessive force. Pat dry using gauze sponges, not tissue or cotton balls. Primary Dressing: Gauze (Generic) 3 x Per Week/30 Days Discharge Instructions: moistened  with dakins Primary Dressing: Hydrofera Blue Ready Transfer Foam, 2.5x2.5 (in/in) (DME) (Generic) 3 x Per Week/30 Days Discharge Instructions: Apply Hydrofera Blue Ready to wound bed as directed Secondary Dressing: ABD Pad 5x9 (in/in) (Generic) 3 x Per Week/30 Days Discharge Instructions: Cover with ABD pad Secured With: Medipore Tape - 30M Medipore H Soft Cloth Surgical Tape, 2x2 (in/yd) (Generic) 3 x Per Week/30 Days Electronic Signature(s) Unsigned Entered By: Massie Kluver on 05/24/2022 14:14:55 Signature(s): Date(s): [name] ([id number]) -------------------------------------------------------------------------------- Problem List Details Patient Name: [name] Date of Service: 05/24/2022 1:30 PM Medical Record Number: [id number] Patient Account Number: [id number] Date of Birth/Sex: August 26, 1966 (56 y.o. M) Treating RN: Carlene Coria Primary Care Provider: Fulton Reek Other Clinician: Massie Kluver Referring Provider: Fulton Reek Treating Provider/Extender: Skipper Cliche in Treatment: 18 Active Problems ICD-10 Encounter Code Description Active Date MDM Diagnosis L89.314 Pressure ulcer of right buttock, stage 4 01/15/2022 No Yes L89.323 Pressure ulcer of left buttock, stage 3 02/05/2022 No Yes Q76.0 Spina bifida occulta 01/15/2022 No Yes Z93.3 Colostomy status 01/15/2022 No Yes I10 Essential (primary) hypertension 01/15/2022 No Yes I25.10 Atherosclerotic heart disease of native coronary artery without angina 01/15/2022 No Yes pectoris Inactive Problems Resolved Problems Electronic Signature(s) Signed: 05/24/2022 1:51:04 PM By: Worthy Keeler PA-C Entered By: Worthy Keeler on 05/24/2022 13:51:04 [name] ([id number]) -------------------------------------------------------------------------------- Progress Note Details Patient Name: [name] Date of Service: 05/24/2022 1:30 PM Medical Record Number: [id number] Patient Account Number: [id number] Date of Birth/Sex: September 01, 1966 (55 y.o. M) Treating RN: Carlene Coria Primary Care Provider: Fulton Reek Other Clinician: Massie Kluver Referring Provider: Fulton Reek Treating Provider/Extender: Skipper Cliche in Treatment: 18 Subjective Chief Complaint Information obtained from Patient Right ischial tuberosity pressure ulcer History of Present Illness (HPI) 56 year old male with a history of spina bifida presenting to [place] with a history of a open wound on the left gluteal region near his upper thigh which she's had for several weeks. He was seen in the ED recently at [place] [facility] and was put on doxycycline and was advised some local care. his past medical history is significant for spinal bifid, neurogenic bladder, kidney stones, sacral pressure sores, constipation. Past surgical history significant for partial cystectomy, ileal conduit, VP shunt  removal, percutaneous nephro lithotripsy. In the remote past the patient says he's had some treatment for a ulcer on this area and was treated with a skin graft. Readmission: 10/16/2021 upon evaluation today patient appears to be doing somewhat poorly in regard to a fairly large wound noted over the right ischial tuberosity location. This is a stage IV pressure ulcer. Of note this is also positive for osteomyelitis upon a CT scan which was performed during the time that he was admitted in the hospital on 09/19/2021. With that being said it is noted in the right inferior buttock that he has a decubitus ulcer which extends to the posterior toe inferior right ischium where there is evidence of osteomyelitis. When he was here in the office actually missed that this was positive I missed read it and thought it said that there was no osteomyelitis. That is not the case there is in fact osteomyelitis noted here. I actually did review his notes as well in epic. Looking to the discharge summary it appears that the patient was admitted from 09/19/2021 through 09/23/2021. He was discharged home with home health care according to the note. With that being said from what I understand his sister actually takes care of him at this point. He  was also to follow-up with a primary care provider and here at the wound care center within a week. I am just now seeing him on the 13th almost a month after discharge. He does have a history of again osteomyelitis of this right hip/ischial tuberosity location. 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 The reason for his admission was actually worsening of the decubitus ulcer upon admission. He does have chronic osteomyelitis of this area. He was treated with empiric antibiotics he had acute kidney injury which improved with IV  fluids he also had hypokalemia. Surgical debridement was performed by general surgery at that time and ID was consulted to assist with management according to the notes. IV antibiotics were recommended but patient declined and wanted oral antibiotics at discharge. Therefore no IV antibiotics were planned for discharge time. This case was under the care of Dr. Steva Ready while he was in the hospital when she did discharge him with a 2-week course of doxycycline and Augmentin according to the notes. It looks like Dr. Doy Hutching office did call and leave a message for the patient to get scheduled for a follow-up visit after hospital discharge I do not see where he showed up in fact it appears that the patient has a no- show letter from Dr. Doy Hutching office noted in epic due to a appointment that was missed on 10/06/2021. Looking at the pictures from Dr. Raelene Bott notes on 09/23/2021 it does appear that the wound is a little bit better as far as the cleanliness of the surface of the wound today but nonetheless still is a very significant wound. 10/26/2021 upon evaluation today patient appears to be doing okay in regard to his wound. Again this is a wound that he did indeed have osteomyelitis and previous. With that being said I do not see any signs of a worsening infection at this time which is good news. Overall I think that we are headed in the right direction although still I think he needs more aggressive offloading. 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 I do believe this would be ideal for him to be honest. Readmission: 01-15-2022 upon evaluation today patient presents for reevaluation he was last seen on October 26, 2021. At that time unfortunately he was having issues with a significant ischial pressure ulcer on the right ischial tuberosity. Subsequently he ended up having decent response to the Dakin's moistened gauze dressing that was previously being utilized and  subsequently the patient's sister who is his primary caregiver as well states that she decided just to try to manage this at home. Nonetheless unfortunately this is getting a little larger not better. He does spend a lot of the day in his chair. He does have an air mattress according what they tell me today. That was previously ordered. With that being said I do believe that at this time things do appear to be doing much better from the standpoint of infection I do not see any signs of overt infection. There is also no evidence of systemic infection. No fevers, chills, nausea, vomiting, or diarrhea. With that being said I do believe based on what I am seeing the wound is a bit cleaner and he possibly could be an excellent candidate for a wound VAC. 02-05-2022 upon evaluation today patient appears to  be doing about the same in regard to his wound. Unfortunately he is not any better but he also really has not had the wound VAC on sufficiently. There is been some confusion with the orders part of that I think is our follow-up with the way the order was written part of it may be with how the wound VAC is being placed either way we need to see what we can do to try to improve things in this regard. We will clarify the orders today going forward for home health. Unfortunately the patient does have a new wound today which is on the opposite side. This is not good 1 was bad enough have another area to open is definitely not a good thing. Its not as deep but nonetheless is also not very shallow either. We may potentially be able to get this to the point that we could bridge the 2 together in order to wound VAC both but right now want a focus on to try to get the wound VAC going for the main wound initially we will likely use Hydrofera Blue on the new wound.Marland Kitchen 02-19-2022 upon evaluation today patient's wound appears to be doing a little bit worse in the way of maceration fortunately there is no signs of active  infection locally or systemically which is great news. No fevers, chills, nausea, vomiting, or diarrhea. [name], [name] ([id number]) 03-05-2022 upon evaluation today patient unfortunately does not appear to be doing nearly as good in regard to his wounds as I would like to see. Fortunately I do not see any evidence of active infection that is obvious although I am can obtain a wound culture to ensure that there is not anything getting worse here as far as the wound without the wound VAC on his concern. With that being said I will go ahead and get this sent in and evaluated will initiate treatment as needed going forward if necessary. With regard to the wound with the wound VAC this continues to be placed directly over the wound and there is obvious signs of deep tissue injury. The suction being here means that he is sitting on it I think this may be part of the reason why it is coming off although not 757% certain. 03-12-2022 upon evaluation today patient still has not gotten the antibiotics that were called in for him 4 days ago. He tells me that his sister just told him this morning that they were ready but that she has not had a chance to go pick them up. Nonetheless I feel like that this is really something he needs any should have been taken that not the other antibiotics which were not doing the job for him to be honest. The patient states and I completely agree that there is really no way he would have known relying on his sister to let him know obviously. Nonetheless I do believe that he needs to not be taking any other antibiotics and be on the Levaquin as soon as possible. He tells me that she said she was going to pick them up today. In regard to home health I am hopeful that they actually have been bridging this now. I do think that the wound looks better and this is good news. With that being said I am not certain that I can really tell for sure how this was attached it was removed before he  came in and the black foam left in place. I really want him to leave the  wound VAC in place until he comes in so that we can monitor and make sure that this is being done correctly he voiced understanding. 03-19-2022 upon evaluation today and much happier with how things appear as far as the patient's wounds are concerned. I think he is on a much better track and they are doing a better job at bridging although its not quite where I like to see it is still more posterior which I think the patient needs to be a little bit more lateral on his side. Either way this is something that I did marked with a small dot today for him to tell the home health nurses well so she can bridge it to that location also think the tubing should go up instead of going down to just make it less cumbersome overall for him. 04-19-2022 upon evaluation today patient appears to be doing a little better in regard to his wounds. Fortunately I do not see any signs of infection I do believe he is on the right track. The wound VAC does seem to be helping to clean the wound up a bit and bring it in from the base at work. This is good news. 05-10-2022 upon evaluation today patient appears to be doing well currently in regard to his wounds in general in fact of the smaller of the 2 wounds without the wound VAC actually appears to be doing so much better this is almost completely closed and very pleased in that regard. He has been staying off of this he tells me and shows. With that being said the wound with the wound VAC is going require some sharp debridement there is some tissue which is not quite as healthy and we are going to go ahead and continue that for probably 2 more weeks although I think we may be closer to discontinuing this as well since it has healed and quite significantly. 05-24-2022 upon evaluation today patient appears to be doing well with regard to his wound in fact this looks better than I have been expected. This  actually I think is a good time to go ahead and switch away from the wound VAC based on what I am seeing. I think we go to [facility] with a bordered foam dressing. Objective Constitutional Well-nourished and well-hydrated in no acute distress. Vitals Time Taken: 1:45 AM, Height: 60 in, Temperature: 97.6 F, Pulse: 98 bpm, Respiratory Rate: 16 breaths/min, Blood Pressure: 108/80 mmHg. Respiratory normal breathing without difficulty. Psychiatric this patient is able to make decisions and demonstrates good insight into disease process. Alert and Oriented x 3. pleasant and cooperative. General Notes: Upon inspection patient's wound bed actually showed signs of excellent granulation there was some need for sharp debridement I did perform debridement today to clearway some of the necrotic debris and the patient tolerated this without complication. Postdebridement the wound bed appears to be doing much better. Integumentary (Hair, Skin) Wound #3 status is Open. Original cause of wound was Gradually Appeared. The date acquired was: 09/03/2021. The wound has been in treatment 18 weeks. The wound is located on the Right Ischial Tuberosity. The wound measures 2.5cm length x 4.6cm width x 0.3cm depth; 9.032cm^2 area and 2.71cm^3 volume. There is Fat Layer (Subcutaneous Tissue) exposed. There is a large amount of serosanguineous drainage noted. There is large (67-100%) red, pink granulation within the wound bed. There is a small (1-33%) amount of necrotic tissue within the wound bed including Eschar and Adherent Slough. Wound #4 status is  Healed - Epithelialized. Original cause of wound was Gradually Appeared. The date acquired was: 01/29/2022. The wound has been in treatment 15 weeks. The wound is located on the Left Ischial Tuberosity. The wound measures 0cm length x 0cm width x 0cm depth; 0cm^2 area and 0cm^3 volume. There is Fat Layer (Subcutaneous Tissue) exposed. There is no tunneling or  undermining noted. There is a medium amount of serosanguineous drainage noted. There is no granulation within the wound bed. There is no necrotic tissue within the wound bed. [place], [name] ([id number]) Assessment Active Problems ICD-10 Pressure ulcer of right buttock, stage 4 Pressure ulcer of left buttock, stage 3 Spina bifida occulta Colostomy status Essential (primary) hypertension Atherosclerotic heart disease of native coronary artery without angina pectoris Procedures Wound #3 Pre-procedure diagnosis of Wound #3 is a Pressure Ulcer located on the Right Ischial Tuberosity . There was a Excisional Skin/Subcutaneous Tissue Debridement with a total area of 11.5 sq cm performed by Tommie Sams., PA-C. With the following instrument(s): Curette to remove Viable and Non-Viable tissue/material. Material removed includes Subcutaneous Tissue and Slough and. A time out was conducted at 02:09, prior to the start of the procedure. A Minimum amount of bleeding was controlled with Pressure. The procedure was tolerated well. Post Debridement Measurements: 2.5cm length x 4.6cm width x 0.3cm depth; 2.71cm^3 volume. Post debridement Stage noted as Category/Stage IV. Character of Wound/Ulcer Post Debridement is stable. Post procedure Diagnosis Wound #3: Same as Pre-Procedure Plan Follow-up Appointments: Return Appointment in 2 weeks. Nurse Visit as needed Home Health: [place]: - [facility] ADMIT to [place] for wound care. May utilize formulary equivalent dressing for wound treatment orders unless otherwise specified. Home Health Nurse may visit PRN to address patient s wound care needs. Scheduled days for dressing changes to be completed; exception, patient has scheduled wound care visit that day. **Please direct any NON-WOUND related issues/requests for orders to patient's Primary Care Physician. **If current dressing causes regression in wound condition, may D/C ordered dressing  product/s and apply Normal Saline Moist Dressing daily until next [place] or Other MD appointment. **Notify [facility] of regression in wound condition at [phone]. Bathing/ Shower/ Hygiene: Clean wound with Normal Saline or wound cleanser. - keep dressing dry or change after shower No tub bath. Off-Loading: Gel wheelchair cushion Hospital bed/mattress - Has at home Air fluidized (Group 3) - Has from [employer] and reposition every 2 hours - Do not sit in wheelchair any longer than you have to--offload wound in bed as much as possible Additional Orders / Instructions: Follow Nutritious Diet and Increase Protein Intake Other: - Refer to ostomy clinic in [facility] if desired Negative Pressure Wound Therapy: Wound #3 Right Ischial Tuberosity: Wound VAC settings at 125mHg continuous pressure. Use foam to wound cavity. Please order WHITE foam to fill any tunnel/s and/or undermining when necessary. Change VAC dressing 3 X WEEK. Change canister as indicated when full. - Change Monday, Wednesday, and Friday Medications-Please add to medication list.: P.O. Antibiotics - PLEASE pick up you called in antibiotic and start ASAP WOUND #3: - Ischial Tuberosity Wound Laterality: Right Cleanser: [place]ram Ancillary Kit - 15 Day Supply (Generic) 3 x Per Week/30 Days Discharge Instructions: Use supplies as instructed; Kit contains: (15) Saline Bullets; (15) 3x3 Gauze; 15 pr Gloves Cleanser: Normal Saline 3 x Per Week/30 Days Discharge Instructions: Wash your hands with soap and water. Remove old dressing, discard into plastic bag and place into trash. Cleanse the wound with Normal Saline  prior to applying a clean dressing using gauze sponges, not tissues or cotton balls. Do not scrub or use excessive force. Pat dry using gauze sponges, not tissue or cotton balls. Cleanser: Wound Cleanser 3 x Per Week/30 Days Discharge Instructions: Wash your hands with soap and water. Remove old  dressing, discard into plastic bag and place into trash. Cleanse the wound with Wound Cleanser prior to applying a clean dressing using gauze sponges, not tissues or cotton balls. Do not scrub or use [name], [name] ([id number]) excessive force. Pat dry using gauze sponges, not tissue or cotton balls. Primary Dressing: Gauze (Generic) 3 x Per Week/30 Days Discharge Instructions: moistened with dakins Primary Dressing: Hydrofera Blue Ready Transfer Foam, 2.5x2.5 (in/in) (DME) (Generic) 3 x Per Week/30 Days Discharge Instructions: Apply Hydrofera Blue Ready to wound bed as directed Secondary Dressing: ABD Pad 5x9 (in/in) (Generic) 3 x Per Week/30 Days Discharge Instructions: Cover with ABD pad Secured With: Medipore Tape - 76M Medipore H Soft Cloth Surgical Tape, 2x2 (in/yd) (Generic) 3 x Per Week/30 Days 1. I am good recommend that we going continue with the wound care measures as before and the patient is in agreement with plan this includes the use of the [facility] dressing which I think is still good to be a good option here in fact this should do well it did excellent for the opposite side I think this side is at a good point to start this currently. 2. Would recommend that we continue with ABD pad to follow with secured with tape. We will see patient back for reevaluation in 3 weeks here in the clinic. If anything worsens or changes patient will contact our office for additional recommendations. Electronic Signature(s) Signed: 05/24/2022 2:48:31 PM By: Worthy Keeler PA-C Entered By: Worthy Keeler on 05/24/2022 14:48:31 [name] ([id number]) -------------------------------------------------------------------------------- SuperBill Details Patient Name: [name] Date of Service: 05/24/2022 Medical Record Number: [id number] Patient Account Number: [id number] Date of Birth/Sex: 26-Apr-1966 (55 y.o. M) Treating RN: Carlene Coria Primary Care Provider: Fulton Reek Other Clinician: Massie Kluver Referring Provider: Fulton Reek Treating Provider/Extender: Skipper Cliche in Treatment: 18 Diagnosis Coding ICD-10 Codes Code Description L89.314 Pressure ulcer of right buttock, stage 4 L89.323 Pressure ulcer of left buttock, stage 3 Q76.0 Spina bifida occulta Z93.3 Colostomy status I10 Essential (primary) hypertension I25.10 Atherosclerotic heart disease of native coronary artery without angina pectoris Facility Procedures CPT4 Code: [id number] Description: 34193 - DEB SUBQ TISSUE 20 SQ CM/< Modifier: Quantity: 1 CPT4 Code: Description: ICD-10 Diagnosis Description L89.314 Pressure ulcer of right buttock, stage 4 Modifier: Quantity: Physician Procedures CPT4 Code: 7902409 Description: 11042 - WC PHYS SUBQ TISS 20 SQ CM Modifier: Quantity: 1 CPT4 Code: Description: ICD-10 Diagnosis Description L89.314 Pressure ulcer of right buttock, stage 4 Modifier: Quantity: Electronic Signature(s) Signed: 05/24/2022 2:48:49 PM By: Worthy Keeler PA-C Entered By: Worthy Keeler on 05/24/2022 14:48:48

## 2022-05-27 NOTE — Progress Notes (Addendum)
AYO, SMOAK ([id number]) Visit Report for 05/24/2022 Arrival Information Details Patient Name: [name], [name] Date of Service: 05/24/2022 1:30 PM Medical Record Number: [id number] Patient Account Number: [id number] Date of Birth/Sex: August 23, 1966 (56 y.o. M) Treating RN: Carlene Coria Primary Care Sahalie Beth: Fulton Reek Other Clinician: Massie Kluver Referring Tuesday Terlecki: Fulton Reek Treating Sinan Tuch/Extender: Skipper Cliche in Treatment: 8 Visit Information History Since Last Visit All ordered tests and consults were completed: No Patient Arrived: Wheel Chair Added or deleted any medications: No Arrival Time: 13:36 Any new allergies or adverse reactions: No Transfer Assistance: Transfer Board Had a fall or experienced change in No Patient Requires Transmission-Based Precautions: No activities of daily living that may affect Patient Has Alerts: Yes risk of falls: Patient Alerts: NOT diabetic Hospitalized since last visit: No Pain Present Now: No Electronic Signature(s) Signed: 05/25/2022 12:46:44 PM By: Massie Kluver Entered By: Massie Kluver on 05/24/2022 13:45:04 [name] ([id number]) -------------------------------------------------------------------------------- Clinic Level of Care Assessment Details Patient Name: [name] Date of Service: 05/24/2022 1:30 PM Medical Record Number: [id number] Patient Account Number: [id number] Date of Birth/Sex: 04/11/1966 (55 y.o. M) Treating RN: Carlene Coria Primary Care Israel Wunder: Fulton Reek Other Clinician: Massie Kluver Referring Karla Pavone: Fulton Reek Treating Marialice Newkirk/Extender: Skipper Cliche in Treatment: 18 Clinic Level of Care Assessment Items TOOL 1 Quantity Score '[]'$  - Use when EandM and Procedure is performed on INITIAL visit 0 ASSESSMENTS - Nursing Assessment / Reassessment '[]'$  - General Physical Exam (combine w/ comprehensive assessment (listed just below) when performed on  new 0 pt. evals) '[]'$  - 0 Comprehensive Assessment (HX, ROS, Risk Assessments, Wounds Hx, etc.) ASSESSMENTS - Wound and Skin Assessment / Reassessment '[]'$  - Dermatologic / Skin Assessment (not related to wound area) 0 ASSESSMENTS - Ostomy and/or Continence Assessment and Care '[]'$  - Incontinence Assessment and Management 0 '[]'$  - 0 Ostomy Care Assessment and Management (repouching, etc.) PROCESS - Coordination of Care '[]'$  - Simple Patient / Family Education for ongoing care 0 '[]'$  - 0 Complex (extensive) Patient / Family Education for ongoing care '[]'$  - 0 Staff obtains Consents, Records, Test Results / Process Orders '[]'$  - 0 Staff telephones HHA, Nursing Homes / Clarify orders / etc '[]'$  - 0 Routine Transfer to another Facility (non-emergent condition) '[]'$  - 0 Routine Hospital Admission (non-emergent condition) '[]'$  - 0 New Admissions / Biomedical engineer / Ordering NPWT, Apligraf, etc. '[]'$  - 0 Emergency Hospital Admission (emergent condition) PROCESS - Special Needs '[]'$  - Pediatric / Minor Patient Management 0 '[]'$  - 0 Isolation Patient Management '[]'$  - 0 Hearing / Language / Visual special needs '[]'$  - 0 Assessment of Community assistance (transportation, D/C planning, etc.) '[]'$  - 0 Additional assistance / Altered mentation '[]'$  - 0 Support Surface(s) Assessment (bed, cushion, seat, etc.) INTERVENTIONS - Miscellaneous '[]'$  - External ear exam 0 '[]'$  - 0 Patient Transfer (multiple staff / Civil Service fast streamer / Similar devices) '[]'$  - 0 Simple Staple / Suture removal (25 or less) '[]'$  - 0 Complex Staple / Suture removal (26 or more) '[]'$  - 0 Hypo/Hyperglycemic Management (do not check if billed separately) '[]'$  - 0 Ankle / Brachial Index (ABI) - do not check if billed separately Has the patient been seen at the hospital within the last three years: Yes Total Score: 0 Level Of Care: ____ [name] ([id number]) Electronic Signature(s) Signed: 05/25/2022 12:46:44 PM By: Massie Kluver Entered By:  Massie Kluver on 05/24/2022 14:15:22 [name] ([id number]) -------------------------------------------------------------------------------- Complex / Palliative Patient Assessment Details Patient Name: [name] Date of Service: 05/24/2022 1:30 PM  Medical Record Number: [id number] Patient Account Number: [id number] Date of Birth/Sex: [birth year] (56 y.o. M) Treating RN: Cornell Barman Primary Care Cassius Cullinane: Fulton Reek Other Clinician: Massie Kluver Referring Coral Timme: Fulton Reek Treating Teea Ducey/Extender: Skipper Cliche in Treatment: 18 Complex Wound Management Criteria Patient has remarkable or complex co-morbidities requiring medications or treatments that extend wound healing times. Examples: o Diabetes mellitus with chronic renal failure or end stage renal disease requiring dialysis o Advanced or poorly controlled rheumatoid arthritis o Diabetes mellitus and end stage chronic obstructive pulmonary disease o Active cancer with current chemo- or radiation therapy bed bound Palliative Wound Management Criteria Care Approach Wound Care Plan: Complex Wound Management Electronic Signature(s) Signed: 06/18/2022 1:49:25 PM By: Gretta Cool, BSN, RN, CWS, Kim RN, BSN Signed: 07/02/2022 12:26:28 PM By: Worthy Keeler PA-C Entered By: Gretta Cool, BSN, RN, CWS, Kim on 06/18/2022 13:49:24 [name] ([id number]) -------------------------------------------------------------------------------- Encounter Discharge Information Details Patient Name: [name] Date of Service: 05/24/2022 1:30 PM Medical Record Number: [id number] Patient Account Number: [id number] Date of Birth/Sex: [birth year] (55 y.o. M) Treating RN: Carlene Coria Primary Care Kimela Malstrom: Fulton Reek Other Clinician: Massie Kluver Referring Nadia Viar: Fulton Reek Treating Sigismund Cross/Extender: Skipper Cliche in Treatment: 18 Encounter Discharge Information Items Post Procedure  Vitals Discharge Condition: Stable Temperature (F): 97.6 Ambulatory Status: Wheelchair Pulse (bpm): 98 Discharge Destination: Home Respiratory Rate (breaths/min): 16 Transportation: Other Blood Pressure (mmHg): 108/80 Accompanied By: self Schedule Follow-up Appointment: Yes Clinical Summary of Care: Electronic Signature(s) Signed: 05/25/2022 01:02:72 PM By: Massie Kluver Entered By: Massie Kluver on 05/24/2022 14:30:11 [name] ([id number]) -------------------------------------------------------------------------------- Lower Extremity Assessment Details Patient Name: [name] Date of Service: 05/24/2022 1:30 PM Medical Record Number: [id number] Patient Account Number: [id number] Date of Birth/Sex: 02-07-1966 (55 y.o. M) Treating RN: Carlene Coria Primary Care Isra Lindy: Fulton Reek Other Clinician: Massie Kluver Referring Wynonia Medero: Fulton Reek Treating Fremont Skalicky/Extender: Skipper Cliche in Treatment: 18 Electronic Signature(s) Signed: 05/25/2022 12:46:44 PM By: Massie Kluver Signed: 05/27/2022 4:19:23 PM By: Carlene Coria RN Entered By: Massie Kluver on 05/24/2022 14:01:51 [name] ([id number]) -------------------------------------------------------------------------------- Multi Wound Chart Details Patient Name: [name] Date of Service: 05/24/2022 1:30 PM Medical Record Number: [id number] Patient Account Number: [id number] Date of Birth/Sex: [birth year] (56 y.o. M) Treating RN: Carlene Coria Primary Care Elbert Spickler: Fulton Reek Other Clinician: Massie Kluver Referring Deeandra Jerry: Fulton Reek Treating Renny Remer/Extender: Skipper Cliche in Treatment: 18 Vital Signs Height(in): 60 Pulse(bpm): 28 Weight(lbs): Blood Pressure(mmHg): 108/80 Body Mass Index(BMI): Temperature(F): 97.6 Respiratory Rate(breaths/min): 16 Photos: [4:No Photos] [N/A:N/A] Wound Location: Right Ischial Tuberosity Left Ischial Tuberosity  N/A Wounding Event: Gradually Appeared Gradually Appeared N/A Primary Etiology: Pressure Ulcer Pressure Ulcer N/A Comorbid History: Cataracts, Glaucoma, Coronary Cataracts, Glaucoma, Coronary N/A Artery Disease, Hypertension, Artery Disease, Hypertension, History of pressure wounds, History of pressure wounds, Osteoarthritis, Paraplegia Osteoarthritis, Paraplegia Date Acquired: 09/03/2021 01/29/2022 N/A Weeks of Treatment: 18 15 N/A Wound Status: Open Open N/A Wound Recurrence: No No N/A Measurements L x W x D (cm) 2.5x4.6x0.3 0.1x0.1x0.1 N/A Area (cm) : 9.032 0.008 N/A Volume (cm) : 2.71 0.001 N/A % Reduction in Area: 48.70% 99.30% N/A % Reduction in Volume: 93.80% 100.00% N/A Classification: Category/Stage IV Category/Stage IV N/A Exudate Amount: Large Medium N/A Exudate Type: Serosanguineous Serosanguineous N/A Exudate Color: red, brown red, brown N/A Granulation Amount: Large (67-100%) None Present (0%) N/A Granulation Quality: Red, Pink N/A N/A Necrotic Amount: Small (1-33%) None Present (0%) N/A Necrotic Tissue: Eschar, Adherent Slough N/A N/A Exposed Structures: Fat Layer (Subcutaneous Tissue): Fat Layer (Subcutaneous Tissue): N/A Yes Yes  Epithelialization: None Small (1-33%) N/A Treatment Notes Electronic Signature(s) Signed: 05/25/2022 12:46:44 PM By: Massie Kluver Entered By: Massie Kluver on 05/24/2022 14:02:07 [name] ([id number]) -------------------------------------------------------------------------------- Multi-Disciplinary Care Plan Details Patient Name: [name] Date of Service: 05/24/2022 1:30 PM Medical Record Number: [id number] Patient Account Number: [id number] Date of Birth/Sex: 1965-12-08 (56 y.o. M) Treating RN: Carlene Coria Primary Care Jaselyn Nahm: Fulton Reek Other Clinician: Massie Kluver Referring Alitzel Cookson: Fulton Reek Treating Aireal Slater/Extender: Skipper Cliche in Treatment: 18 Active Inactive Pressure Nursing  Diagnoses: Knowledge deficit related to causes and risk factors for pressure ulcer development Knowledge deficit related to management of pressures ulcers Potential for impaired tissue integrity related to pressure, friction, moisture, and shear Goals: Patient will remain free from development of additional pressure ulcers Date Initiated: 01/15/2022 Target Resolution Date: 02/26/2022 Goal Status: Active Patient/caregiver will verbalize risk factors for pressure ulcer development Date Initiated: 01/15/2022 Target Resolution Date: 03/05/2022 Goal Status: Active Interventions: Assess: immobility, friction, shearing, incontinence upon admission and as needed Assess offloading mechanisms upon admission and as needed Assess potential for pressure ulcer upon admission and as needed Notes: Electronic Signature(s) Signed: 05/25/2022 12:46:44 PM By: Massie Kluver Signed: 05/27/2022 4:19:23 PM By: Carlene Coria RN Entered By: Massie Kluver on 05/24/2022 14:01:58 [name] ([id number]) -------------------------------------------------------------------------------- Pain Assessment Details Patient Name: [name] Date of Service: 05/24/2022 1:30 PM Medical Record Number: [id number] Patient Account Number: [id number] Date of Birth/Sex: Feb 28, 1966 (56 y.o. M) Treating RN: Carlene Coria Primary Care Dennis Hegeman: Fulton Reek Other Clinician: Massie Kluver Referring Trindon Dorton: Fulton Reek Treating Reo Portela/Extender: Skipper Cliche in Treatment: 18 Active Problems Location of Pain Severity and Description of Pain Patient Has Paino No Site Locations Pain Management and Medication Current Pain Management: Electronic Signature(s) Signed: 05/25/2022 12:46:44 PM By: Massie Kluver Signed: 05/27/2022 4:19:23 PM By: Carlene Coria RN Entered By: Massie Kluver on 05/24/2022 14:00:11 [name]  ([id number]) -------------------------------------------------------------------------------- Patient/Caregiver Education Details Patient Name: [name] Date of Service: 05/24/2022 1:30 PM Medical Record Number: [id number] Patient Account Number: [id number] Date of Birth/Gender: Feb 09, 1966 (56 y.o. M) Treating RN: Carlene Coria Primary Care Physician: Fulton Reek Other Clinician: Massie Kluver Referring Physician: Fulton Reek Treating Physician/Extender: Skipper Cliche in Treatment: 18 Education Assessment Education Provided To: Patient Education Topics Provided Wound/Skin Impairment: Handouts: Other: continue wound care as directed Methods: Explain/Verbal Responses: State content correctly Electronic Signature(s) Signed: 05/25/2022 12:46:44 PM By: Massie Kluver Entered By: Massie Kluver on 05/24/2022 14:15:57 [name] ([id number]) -------------------------------------------------------------------------------- Wound Assessment Details Patient Name: [name] Date of Service: 05/24/2022 1:30 PM Medical Record Number: [id number] Patient Account Number: [id number] Date of Birth/Sex: [birth year] (55 y.o. M) Treating RN: Carlene Coria Primary Care Remer Couse: Fulton Reek Other Clinician: Massie Kluver Referring Tyan Dy: Fulton Reek Treating Mcgregor Tinnon/Extender: Skipper Cliche in Treatment: 18 Wound Status Wound Number: 3 Primary Pressure Ulcer Etiology: Wound Location: Right Ischial Tuberosity Wound Open Wounding Event: Gradually Appeared Status: Date Acquired: 09/03/2021 Comorbid Cataracts, Glaucoma, Coronary Artery Disease, Weeks Of Treatment: 18 History: Hypertension, History of pressure wounds, Osteoarthritis, Clustered Wound: No Paraplegia Photos Wound Measurements Length: (cm) 2.5 Width: (cm) 4.6 Depth: (cm) 0.3 Area: (cm) 9.032 Volume: (cm) 2.71 % Reduction in Area: 48.7% % Reduction in Volume:  93.8% Epithelialization: None Wound Description Classification: Category/Stage IV Exudate Amount: Large Exudate Type: Serosanguineous Exudate Color: red, brown Foul Odor After Cleansing: No Slough/Fibrino Yes Wound Bed Granulation Amount: Large (67-100%) Exposed Structure Granulation Quality: Red, Pink Fat Layer (Subcutaneous Tissue) Exposed: Yes Necrotic Amount: Small (1-33%) Necrotic Quality: Eschar, Adherent Therapist, music) Signed:  05/25/2022 12:46:44 PM By: Massie Kluver Signed: 05/27/2022 4:19:23 PM By: Carlene Coria RN Entered By: Massie Kluver on 05/24/2022 14:00:50 [name] ([id number]) -------------------------------------------------------------------------------- Wound Assessment Details Patient Name: [name] Date of Service: 05/24/2022 1:30 PM Medical Record Number: [id number] Patient Account Number: [id number] Date of Birth/Sex: 04-04-1966 (55 y.o. M) Treating RN: Carlene Coria Primary Care Ladena Jacquez: Fulton Reek Other Clinician: Massie Kluver Referring Kippy Gohman: Fulton Reek Treating Teah Votaw/Extender: Skipper Cliche in Treatment: 18 Wound Status Wound Number: 4 Primary Pressure Ulcer Etiology: Wound Location: Left Ischial Tuberosity Wound Healed - Epithelialized Wounding Event: Gradually Appeared Status: Date Acquired: 01/29/2022 Comorbid Cataracts, Glaucoma, Coronary Artery Disease, Weeks Of Treatment: 15 History: Hypertension, History of pressure wounds, Osteoarthritis, Clustered Wound: No Paraplegia Wound Measurements Length: (cm) 0 Width: (cm) 0 Depth: (cm) 0 Area: (cm) 0 Volume: (cm) 0 % Reduction in Area: 100% % Reduction in Volume: 100% Epithelialization: Small (1-33%) Tunneling: No Undermining: No Wound Description Classification: Category/Stage IV Exudate Amount: Medium Exudate Type: Serosanguineous Exudate Color: red, brown Foul Odor After Cleansing: No Slough/Fibrino Yes Wound  Bed Granulation Amount: None Present (0%) Exposed Structure Necrotic Amount: None Present (0%) Fat Layer (Subcutaneous Tissue) Exposed: Yes Treatment Notes Wound #4 (Ischial Tuberosity) Wound Laterality: Left Cleanser Peri-Wound Care Topical Primary Dressing Secondary Dressing Secured With Compression Wrap Compression Stockings Add-Ons Electronic Signature(s) Signed: 05/25/2022 12:46:44 PM By: Massie Kluver Signed: 05/27/2022 4:19:23 PM By: Carlene Coria RN Entered By: Massie Kluver on 05/24/2022 14:09:21 [name] ([id number]) -------------------------------------------------------------------------------- Vitals Details Patient Name: [name] Date of Service: 05/24/2022 1:30 PM Medical Record Number: [id number] Patient Account Number: [id number] Date of Birth/Sex: [birth year] (56 y.o. M) Treating RN: Carlene Coria Primary Care Hulan Szumski: Fulton Reek Other Clinician: Massie Kluver Referring Yoona Ishii: Fulton Reek Treating Judeth Gilles/Extender: Skipper Cliche in Treatment: 18 Vital Signs Time Taken: 01:45 Temperature (F): 97.6 Height (in): 60 Pulse (bpm): 98 Respiratory Rate (breaths/min): 16 Blood Pressure (mmHg): 108/80 Reference Range: 80 - 120 mg / dl Electronic Signature(s) Signed: 05/25/2022 12:46:44 PM By: Massie Kluver Entered By: Massie Kluver on 05/24/2022 14:00:05

## 2022-06-14 ENCOUNTER — Ambulatory Visit: Payer: Medicare Other | Admitting: Physician Assistant

## 2022-06-21 ENCOUNTER — Encounter: Payer: Medicare Other | Attending: Physician Assistant | Admitting: Physician Assistant

## 2022-06-21 DIAGNOSIS — Q760 Spina bifida occulta: Secondary | ICD-10-CM | POA: Diagnosis not present

## 2022-06-21 DIAGNOSIS — M1990 Unspecified osteoarthritis, unspecified site: Secondary | ICD-10-CM | POA: Insufficient documentation

## 2022-06-21 DIAGNOSIS — I129 Hypertensive chronic kidney disease with stage 1 through stage 4 chronic kidney disease, or unspecified chronic kidney disease: Secondary | ICD-10-CM | POA: Diagnosis not present

## 2022-06-21 DIAGNOSIS — G8220 Paraplegia, unspecified: Secondary | ICD-10-CM | POA: Diagnosis not present

## 2022-06-21 DIAGNOSIS — L89314 Pressure ulcer of right buttock, stage 4: Secondary | ICD-10-CM | POA: Diagnosis present

## 2022-06-21 DIAGNOSIS — N319 Neuromuscular dysfunction of bladder, unspecified: Secondary | ICD-10-CM | POA: Diagnosis not present

## 2022-06-21 DIAGNOSIS — Z87442 Personal history of urinary calculi: Secondary | ICD-10-CM | POA: Insufficient documentation

## 2022-06-21 DIAGNOSIS — E876 Hypokalemia: Secondary | ICD-10-CM | POA: Diagnosis not present

## 2022-06-21 DIAGNOSIS — N183 Chronic kidney disease, stage 3 unspecified: Secondary | ICD-10-CM | POA: Diagnosis not present

## 2022-06-21 DIAGNOSIS — I2510 Atherosclerotic heart disease of native coronary artery without angina pectoris: Secondary | ICD-10-CM | POA: Diagnosis not present

## 2022-06-21 DIAGNOSIS — L89323 Pressure ulcer of left buttock, stage 3: Secondary | ICD-10-CM | POA: Insufficient documentation

## 2022-06-21 DIAGNOSIS — Z933 Colostomy status: Secondary | ICD-10-CM | POA: Diagnosis not present

## 2022-06-21 NOTE — Progress Notes (Signed)
DONAVON, KIMREY ([id number]) Visit Report for 06/21/2022 Chief Complaint Document Details Patient Name: [name], [name] Date of Service: 06/21/2022 1:30 PM Medical Record Number: [id number] Patient Account Number: [id number] Date of Birth/Sex: [birth year] (56 y.o. M) Treating RN: Carlene Coria Primary Care Provider: Fulton Reek Other Clinician: Massie Kluver Referring Provider: Fulton Reek Treating Provider/Extender: Skipper Cliche in Treatment: 22 Information Obtained from: Patient Chief Complaint Right ischial tuberosity pressure ulcer Electronic Signature(s) Signed: 06/21/2022 1:46:31 PM By: Worthy Keeler PA-C Entered By: Worthy Keeler on 06/21/2022 13:46:31 [name] ([id number]) -------------------------------------------------------------------------------- HPI Details Patient Name: [name] Date of Service: 06/21/2022 1:30 PM Medical Record Number: [id number] Patient Account Number: [id number] Date of Birth/Sex: [birth year] (55 y.o. M) Treating RN: Carlene Coria Primary Care Provider: Fulton Reek Other Clinician: Massie Kluver Referring Provider: Fulton Reek Treating Provider/Extender: Skipper Cliche in Treatment: 68 History of Present Illness HPI Description: 56 year old male with a history of spina bifida presenting to [place] with a history of a open wound on the left gluteal region near his upper thigh which she's had for several weeks. He was seen in the ED recently at [facility] and was put on doxycycline and was advised some local care. his past medical history is significant for spinal bifid, neurogenic bladder, kidney stones, sacral pressure sores, constipation. Past surgical history significant for partial cystectomy, ileal conduit, VP shunt removal, percutaneous nephro lithotripsy. In the remote past the patient says he's had some treatment for a ulcer on this area and was treated with a skin  graft. Readmission: 10/16/2021 upon evaluation today patient appears to be doing somewhat poorly in regard to a fairly large wound noted over the right ischial tuberosity location. This is a stage IV pressure ulcer. Of note this is also positive for osteomyelitis upon a CT scan which was performed during the time that he was admitted in the hospital on 09/19/2021. With that being said it is noted in the right inferior buttock that he has a decubitus ulcer which extends to the posterior toe inferior right ischium where there is evidence of osteomyelitis. When he was here in the office actually missed that this was positive I missed read it and thought it said that there was no osteomyelitis. That is not the case there is in fact osteomyelitis noted here. I actually did review his notes as well in epic. Looking to the discharge summary it appears that the patient was admitted from 09/19/2021 through 09/23/2021. He was discharged home with home health care according to the note. With that being said from what I understand his sister actually takes care of him at this point. He was also to follow-up with a primary care provider and here at the wound care center within a week. I am just now seeing him on the 13th almost a month after discharge. He does have a history of again osteomyelitis of this right hip/ischial tuberosity location. 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 The reason for his admission was actually worsening of the decubitus ulcer upon admission. He does have chronic osteomyelitis of this area. He was treated with empiric antibiotics he had acute kidney injury which improved with IV fluids he also had hypokalemia. Surgical debridement was performed by general surgery at that time and ID was consulted to assist with management according to  the  notes. IV antibiotics were recommended but patient declined and wanted oral antibiotics at discharge. Therefore no IV antibiotics were planned for discharge time. This case was under the care of Dr. Steva Ready while he was in the hospital when she did discharge him with a 2-week course of doxycycline and Augmentin according to the notes. It looks like Dr. Doy Hutching office did call and leave a message for the patient to get scheduled for a follow-up visit after hospital discharge I do not see where he showed up in fact it appears that the patient has a no- show letter from Dr. Doy Hutching office noted in epic due to a appointment that was missed on 10/06/2021. Looking at the pictures from Dr. Raelene Bott notes on 09/23/2021 it does appear that the wound is a little bit better as far as the cleanliness of the surface of the wound today but nonetheless still is a very significant wound. 10/26/2021 upon evaluation today patient appears to be doing okay in regard to his wound. Again this is a wound that he did indeed have osteomyelitis and previous. With that being said I do not see any signs of a worsening infection at this time which is good news. Overall I think that we are headed in the right direction although still I think he needs more aggressive offloading. 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 I do believe this would be ideal for him to be honest. Readmission: 01-15-2022 upon evaluation today patient presents for reevaluation he was last seen on October 26, 2021. At that time unfortunately he was having issues with a significant ischial pressure ulcer on the right ischial tuberosity. Subsequently he ended up having decent response to the Dakin's moistened gauze dressing that was previously being utilized and subsequently the patient's sister who is his primary caregiver as well states that she decided just to try to manage this at home. Nonetheless unfortunately this is  getting a little larger not better. He does spend a lot of the day in his chair. He does have an air mattress according what they tell me today. That was previously ordered. With that being said I do believe that at this time things do appear to be doing much better from the standpoint of infection I do not see any signs of overt infection. There is also no evidence of systemic infection. No fevers, chills, nausea, vomiting, or diarrhea. With that being said I do believe based on what I am seeing the wound is a bit cleaner and he possibly could be an excellent candidate for a wound VAC. 02-05-2022 upon evaluation today patient appears to be doing about the same in regard to his wound. Unfortunately he is not any better but he also really has not had the wound VAC on sufficiently. There is been some confusion with the orders part of that I think is our follow-up with the way the order was written part of it may be with how the wound VAC is being placed either way we need to see what we can do to try to improve things in this regard. We [name] clarify the orders today going forward for home health. Unfortunately the patient does have a new wound today which is on the opposite side. This is not good 1 was bad enough have another area to open is definitely not a good thing. Its not as deep but nonetheless is also not very shallow either. We may potentially be able to get this  to the point that we could bridge the 2 together in order to wound VAC both but right now want a focus on to try to get the wound VAC going for the main wound initially we [name] likely use Hydrofera Blue on the new wound.Marland Kitchen 02-19-2022 upon evaluation today patient's wound appears to be doing a little bit worse in the way of maceration fortunately there is no signs of active infection locally or systemically which is great news. No fevers, chills, nausea, vomiting, or diarrhea. 03-05-2022 upon evaluation today patient unfortunately does not  appear to be doing nearly as good in regard to his wounds as I would like to see. Fortunately I do not see any evidence of active infection that is obvious although I am can obtain a wound culture to ensure that there is not anything getting worse here as far as the wound without the wound VAC on his concern. With that being said I [name] go ahead and get this sent in and evaluated [name] initiate treatment as needed going forward if necessary. With regard to the wound with the wound VAC this continues to be placed directly over the wound and there is obvious signs of deep tissue injury. The suction being here means that he is sitting on it I think this ASIAH, BEFORT ([id number]) may be part of the reason why it is coming off although not 700% certain. 03-12-2022 upon evaluation today patient still has not gotten the antibiotics that were called in for him 4 days ago. He tells me that his sister just told him this morning that they were ready but that she has not had a chance to go pick them up. Nonetheless I feel like that this is really something he needs any should have been taken that not the other antibiotics which were not doing the job for him to be honest. The patient states and I completely agree that there is really no way he would have known relying on his sister to let him know obviously. Nonetheless I do believe that he needs to not be taking any other antibiotics and be on the Levaquin as soon as possible. He tells me that she said she was going to pick them up today. In regard to home health I am hopeful that they actually have been bridging this now. I do think that the wound looks better and this is good news. With that being said I am not certain that I can really tell for sure how this was attached it was removed before he came in and the black foam left in place. I really want him to leave the wound VAC in place until he comes in so that we can monitor and make sure that this is being  done correctly he voiced understanding. 03-19-2022 upon evaluation today and much happier with how things appear as far as the patient's wounds are concerned. I think he is on a much better track and they are doing a better job at bridging although its not quite where I like to see it is still more posterior which I think the patient needs to be a little bit more lateral on his side. Either way this is something that I did marked with a small dot today for him to tell the home health nurses well so she can bridge it to that location also think the tubing should go up instead of going down to just make it less cumbersome overall for him. 04-19-2022 upon evaluation today  patient appears to be doing a little better in regard to his wounds. Fortunately I do not see any signs of infection I do believe he is on the right track. The wound VAC does seem to be helping to clean the wound up a bit and bring it in from the base at work. This is good news. 05-10-2022 upon evaluation today patient appears to be doing well currently in regard to his wounds in general in fact of the smaller of the 2 wounds without the wound VAC actually appears to be doing so much better this is almost completely closed and very pleased in that regard. He has been staying off of this he tells me and shows. With that being said the wound with the wound VAC is going require some sharp debridement there is some tissue which is not quite as healthy and we are going to go ahead and continue that for probably 2 more weeks although I think we may be closer to discontinuing this as well since it has healed and quite significantly. 05-24-2022 upon evaluation today patient appears to be doing well with regard to his wound in fact this looks better than I have been expected. This actually I think is a good time to go ahead and switch away from the wound VAC based on what I am seeing. I think we go to [facility] with a bordered foam  dressing. 06-21-2022 upon evaluation today patient appears to be doing well currently in regard to his wound this is measuring smaller and looks well. Fortunately there does not appear to be any signs of active infection locally or systemically at this time which is great news. No fevers, chills, nausea, vomiting, or diarrhea. Electronic Signature(s) Signed: 06/21/2022 1:59:41 PM By: Worthy Keeler PA-C Entered By: Worthy Keeler on 06/21/2022 13:59:41 [name] ([id number]) -------------------------------------------------------------------------------- Otelia Sergeant TISS Details Patient Name: [name] Date of Service: 06/21/2022 1:30 PM Medical Record Number: [id number] Patient Account Number: [id number] Date of Birth/Sex: 1966-01-05 (55 y.o. M) Treating RN: Carlene Coria Primary Care Provider: Fulton Reek Other Clinician: Massie Kluver Referring Provider: Fulton Reek Treating Provider/Extender: Skipper Cliche in Treatment: 22 Procedure Performed for: Wound #3 Right Ischial Tuberosity Performed By: Physician Tommie Sams., PA-C Post Procedure Diagnosis Same as Pre-procedure Notes one silver nitrate stick used to right ischial tuberosity Electronic Signature(s) Unsigned Entered By: Massie Kluver on 06/21/2022 13:56:57 Signature(s): Date(s): [name] ([id number]) -------------------------------------------------------------------------------- Physical Exam Details Patient Name: [name], [name] Date of Service: 06/21/2022 1:30 PM Medical Record Number: [id number] Patient Account Number: [id number] Date of Birth/Sex: 12/08/1965 (56 y.o. M) Treating RN: Carlene Coria Primary Care Provider: Fulton Reek Other Clinician: Massie Kluver Referring Provider: Fulton Reek Treating Provider/Extender: Skipper Cliche in Treatment: 78 Constitutional Well-nourished and well-hydrated in no acute distress. Respiratory normal breathing  without difficulty. Psychiatric this patient is able to make decisions and demonstrates good insight into disease process. Alert and Oriented x 3. pleasant and cooperative. Notes Patient's wound bed showed evidence of good granulation and epithelization at this point. Fortunately I see no evidence of infection locally or systemically which is great news and overall I am extremely pleased with where we stand. I did perform chemical cauterization with silver nitrate to clearway some of the hypergranulation tissue. The patient tolerated this without complication and post procedure the appearance is great to the surface of the wound but hopefully should clear up and flatten out a lot allowing for better epithelial growth. Electronic Signature(s) Signed:  06/21/2022 2:00:13 PM By: Worthy Keeler PA-C Entered By: Worthy Keeler on 06/21/2022 14:00:13 [name] ([id number]) -------------------------------------------------------------------------------- Physician Orders Details Patient Name: [name] Date of Service: 06/21/2022 1:30 PM Medical Record Number: [id number] Patient Account Number: [id number] Date of Birth/Sex: 1966/02/16 (55 y.o. M) Treating RN: Carlene Coria Primary Care Provider: Fulton Reek Other Clinician: Massie Kluver Referring Provider: Fulton Reek Treating Provider/Extender: Skipper Cliche in Treatment: 60 Verbal / Phone Orders: No Diagnosis Coding ICD-10 Coding Code Description L89.314 Pressure ulcer of right buttock, stage 4 L89.323 Pressure ulcer of left buttock, stage 3 Q76.0 Spina bifida occulta Z93.3 Colostomy status I10 Essential (primary) hypertension I25.10 Atherosclerotic heart disease of native coronary artery without angina pectoris Follow-up Appointments o Return Appointment in 2 weeks. o Nurse Visit as needed [place]: - [facility] o ADMIT to [facility] for wound care. May utilize formulary  equivalent dressing for wound treatment orders unless otherwise specified. Home Health Nurse may visit PRN to address patientos wound care needs. o Scheduled days for dressing changes to be completed; exception, patient has scheduled wound care visit that day. o **Please direct any NON-WOUND related issues/requests for orders to patient's Primary Care Physician. **If current dressing causes regression in wound condition, may D/C ordered dressing product/s and apply Normal Saline Moist Dressing daily until next [place] or Other MD appointment. **Notify [facility] of regression in wound condition at [phone]. Bathing/ Shower/ Hygiene o Clean wound with Normal Saline or wound cleanser. - keep dressing dry or change after shower o No tub bath. Off-Loading o Gel wheelchair cushion o Hospital bed/mattress - Has at home o Air fluidized (Group 3) - Has from [employer] and reposition every 2 hours - Do not sit in wheelchair any longer than you have to--offload wound in bed as much as possible Additional Orders / Instructions o Follow Nutritious Diet and Increase Protein Intake o Other: - Refer to ostomy clinic in [facility] if desired Negative Pressure Wound Therapy Wound #3 Right Ischial Tuberosity o Wound VAC settings at 148mHg continuous pressure. Use foam to wound cavity. Please order WHITE foam to fill any tunnel/s and/or undermining when necessary. Change VAC dressing 3 X WEEK. Change canister as indicated when full. - Change Monday, Wednesday, and Friday Medications-Please add to medication list. o P.O. Antibiotics - PLEASE pick up you called in antibiotic and start ASAP Wound Treatment Wound #3 - Ischial Tuberosity Wound Laterality: Right Cleanser: Byram Ancillary Kit - 15 Day Supply (Generic) 3 x Per Week/30 Days Discharge Instructions: Use supplies as instructed; Kit contains: (15) Saline Bullets; (15) 3x3 Gauze; 15 pr  Gloves Cleanser: Normal Saline 3 x Per Week/30 Days [name], [name]([id number] Discharge Instructions: Wash your hands with soap and water. Remove old dressing, discard into plastic bag and place into trash. Cleanse the wound with Normal Saline prior to applying a clean dressing using gauze sponges, not tissues or cotton balls. Do not scrub or use excessive force. Pat dry using gauze sponges, not tissue or cotton balls. Cleanser: Wound Cleanser 3 x Per Week/30 Days Discharge Instructions: Wash your hands with soap and water. Remove old dressing, discard into plastic bag and place into trash. Cleanse the wound with Wound Cleanser prior to applying a clean dressing using gauze sponges, not tissues or cotton balls. Do not scrub or use excessive force. Pat dry using gauze sponges, not tissue or cotton balls. Primary Dressing: Gauze (Generic) 3 x Per Week/30 Days Discharge Instructions: moistened  with dakins Primary Dressing: Hydrofera Blue Ready Transfer Foam, 2.5x2.5 (in/in) (Generic) 3 x Per Week/30 Days Discharge Instructions: Apply Hydrofera Blue Ready to wound bed as directed Secondary Dressing: ABD Pad 5x9 (in/in) (Generic) 3 x Per Week/30 Days Discharge Instructions: Cover with ABD pad Secured With: Medipore Tape - 77M Medipore H Soft Cloth Surgical Tape, 2x2 (in/yd) (Generic) 3 x Per Week/30 Days Electronic Signature(s) Unsigned Entered By: Massie Kluver on 06/21/2022 13:57:19 Signature(s): Date(s): [name] ([id number]) -------------------------------------------------------------------------------- Problem List Details Patient Name: [name] Date of Service: 06/21/2022 1:30 PM Medical Record Number: [id number] Patient Account Number: [id number] Date of Birth/Sex: 29-Aug-1966 (56 y.o. M) Treating RN: Carlene Coria Primary Care Provider: Fulton Reek Other Clinician: Massie Kluver Referring Provider: Fulton Reek Treating Provider/Extender: Skipper Cliche in Treatment: 22 Active Problems ICD-10 Encounter Code Description Active Date MDM Diagnosis L89.314 Pressure ulcer of right buttock, stage 4 01/15/2022 No Yes L89.323 Pressure ulcer of left buttock, stage 3 02/05/2022 No Yes Q76.0 Spina bifida occulta 01/15/2022 No Yes Z93.3 Colostomy status 01/15/2022 No Yes I10 Essential (primary) hypertension 01/15/2022 No Yes I25.10 Atherosclerotic heart disease of native coronary artery without angina 01/15/2022 No Yes pectoris Inactive Problems Resolved Problems Electronic Signature(s) Signed: 06/21/2022 1:46:26 PM By: Worthy Keeler PA-C Entered By: Worthy Keeler on 06/21/2022 13:46:26 [name] ([id number]) -------------------------------------------------------------------------------- Progress Note Details Patient Name: [name] Date of Service: 06/21/2022 1:30 PM Medical Record Number: [id number] Patient Account Number: [id number] Date of Birth/Sex: November 07, 1965 (55 y.o. M) Treating RN: Carlene Coria Primary Care Provider: Fulton Reek Other Clinician: Massie Kluver Referring Provider: Fulton Reek Treating Provider/Extender: Skipper Cliche in Treatment: 22 Subjective Chief Complaint Information obtained from Patient Right ischial tuberosity pressure ulcer History of Present Illness (HPI) 56 year old male with a history of spina bifida presenting to [place] with a history of a open wound on the left gluteal region near his upper thigh which she's had for several weeks. He was seen in the ED recently at [facility] and was put on doxycycline and was advised some local care. his past medical history is significant for spinal bifid, neurogenic bladder, kidney stones, sacral pressure sores, constipation. Past surgical history significant for partial cystectomy, ileal conduit, VP shunt removal, percutaneous nephro lithotripsy. In the remote past the patient says he's had some treatment for a ulcer on  this area and was treated with a skin graft. Readmission: 10/16/2021 upon evaluation today patient appears to be doing somewhat poorly in regard to a fairly large wound noted over the right ischial tuberosity location. This is a stage IV pressure ulcer. Of note this is also positive for osteomyelitis upon a CT scan which was performed during the time that he was admitted in the hospital on 09/19/2021. With that being said it is noted in the right inferior buttock that he has a decubitus ulcer which extends to the posterior toe inferior right ischium where there is evidence of osteomyelitis. When he was here in the office actually missed that this was positive I missed read it and thought it said that there was no osteomyelitis. That is not the case there is in fact osteomyelitis noted here. I actually did review his notes as well in epic. Looking to the discharge summary it appears that the patient was admitted from 09/19/2021 through 09/23/2021. He was discharged home with home health care according to the note. With that being said from what I understand his sister actually takes care of him at this point. He was  also to follow-up with a primary care provider and here at the wound care center within a week. I am just now seeing him on the 13th almost a month after discharge. He does have a history of again osteomyelitis of this right hip/ischial tuberosity location. 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 The reason for his admission was actually worsening of the decubitus ulcer upon admission. He does have chronic osteomyelitis of this area. He was treated with empiric antibiotics he had acute kidney injury which improved with IV fluids he also had hypokalemia. Surgical debridement was performed by general surgery at that time and ID was consulted  to assist with management according to the notes. IV antibiotics were recommended but patient declined and wanted oral antibiotics at discharge. Therefore no IV antibiotics were planned for discharge time. This case was under the care of Dr. Steva Ready while he was in the hospital when she did discharge him with a 2-week course of doxycycline and Augmentin according to the notes. It looks like Dr. Doy Hutching office did call and leave a message for the patient to get scheduled for a follow-up visit after hospital discharge I do not see where he showed up in fact it appears that the patient has a no- show letter from Dr. Doy Hutching office noted in epic due to a appointment that was missed on 10/06/2021. Looking at the pictures from Dr. Raelene Bott notes on 09/23/2021 it does appear that the wound is a little bit better as far as the cleanliness of the surface of the wound today but nonetheless still is a very significant wound. 10/26/2021 upon evaluation today patient appears to be doing okay in regard to his wound. Again this is a wound that he did indeed have osteomyelitis and previous. With that being said I do not see any signs of a worsening infection at this time which is good news. Overall I think that we are headed in the right direction although still I think he needs more aggressive offloading. 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 I do believe this would be ideal for him to be honest. Readmission: 01-15-2022 upon evaluation today patient presents for reevaluation he was last seen on October 26, 2021. At that time unfortunately he was having issues with a significant ischial pressure ulcer on the right ischial tuberosity. Subsequently he ended up having decent response to the Dakin's moistened gauze dressing that was previously being utilized and subsequently the patient's sister who is his primary caregiver as well states that she decided just to try to manage this at home.  Nonetheless unfortunately this is getting a little larger not better. He does spend a lot of the day in his chair. He does have an air mattress according what they tell me today. That was previously ordered. With that being said I do believe that at this time things do appear to be doing much better from the standpoint of infection I do not see any signs of overt infection. There is also no evidence of systemic infection. No fevers, chills, nausea, vomiting, or diarrhea. With that being said I do believe based on what I am seeing the wound is a bit cleaner and he possibly could be an excellent candidate for a wound VAC. 02-05-2022 upon evaluation today patient appears to be  doing about the same in regard to his wound. Unfortunately he is not any better but he also really has not had the wound VAC on sufficiently. There is been some confusion with the orders part of that I think is our follow-up with the way the order was written part of it may be with how the wound VAC is being placed either way we need to see what we can do to try to improve things in this regard. We [name] clarify the orders today going forward for home health. Unfortunately the patient does have a new wound today which is on the opposite side. This is not good 1 was bad enough have another area to open is definitely not a good thing. Its not as deep but nonetheless is also not very shallow either. We may potentially be able to get this to the point that we could bridge the 2 together in order to wound VAC both but right now want a focus on to try to get the wound VAC going for the main wound initially we [name] likely use Hydrofera Blue on the new wound.Marland Kitchen 02-19-2022 upon evaluation today patient's wound appears to be doing a little bit worse in the way of maceration fortunately there is no signs of active infection locally or systemically which is great news. No fevers, chills, nausea, vomiting, or diarrhea. [name], [name]  ([id number]) 03-05-2022 upon evaluation today patient unfortunately does not appear to be doing nearly as good in regard to his wounds as I would like to see. Fortunately I do not see any evidence of active infection that is obvious although I am can obtain a wound culture to ensure that there is not anything getting worse here as far as the wound without the wound VAC on his concern. With that being said I [name] go ahead and get this sent in and evaluated [name] initiate treatment as needed going forward if necessary. With regard to the wound with the wound VAC this continues to be placed directly over the wound and there is obvious signs of deep tissue injury. The suction being here means that he is sitting on it I think this may be part of the reason why it is coming off although not 314% certain. 03-12-2022 upon evaluation today patient still has not gotten the antibiotics that were called in for him 4 days ago. He tells me that his sister just told him this morning that they were ready but that she has not had a chance to go pick them up. Nonetheless I feel like that this is really something he needs any should have been taken that not the other antibiotics which were not doing the job for him to be honest. The patient states and I completely agree that there is really no way he would have known relying on his sister to let him know obviously. Nonetheless I do believe that he needs to not be taking any other antibiotics and be on the Levaquin as soon as possible. He tells me that she said she was going to pick them up today. In regard to home health I am hopeful that they actually have been bridging this now. I do think that the wound looks better and this is good news. With that being said I am not certain that I can really tell for sure how this was attached it was removed before he came in and the black foam left in place. I really want him to leave the wound VAC  in place until he comes in so that we  can monitor and make sure that this is being done correctly he voiced understanding. 03-19-2022 upon evaluation today and much happier with how things appear as far as the patient's wounds are concerned. I think he is on a much better track and they are doing a better job at bridging although its not quite where I like to see it is still more posterior which I think the patient needs to be a little bit more lateral on his side. Either way this is something that I did marked with a small dot today for him to tell the home health nurses well so she can bridge it to that location also think the tubing should go up instead of going down to just make it less cumbersome overall for him. 04-19-2022 upon evaluation today patient appears to be doing a little better in regard to his wounds. Fortunately I do not see any signs of infection I do believe he is on the right track. The wound VAC does seem to be helping to clean the wound up a bit and bring it in from the base at work. This is good news. 05-10-2022 upon evaluation today patient appears to be doing well currently in regard to his wounds in general in fact of the smaller of the 2 wounds without the wound VAC actually appears to be doing so much better this is almost completely closed and very pleased in that regard. He has been staying off of this he tells me and shows. With that being said the wound with the wound VAC is going require some sharp debridement there is some tissue which is not quite as healthy and we are going to go ahead and continue that for probably 2 more weeks although I think we may be closer to discontinuing this as well since it has healed and quite significantly. 05-24-2022 upon evaluation today patient appears to be doing well with regard to his wound in fact this looks better than I have been expected. This actually I think is a good time to go ahead and switch away from the wound VAC based on what I am seeing. I think we go to  [facility] with a bordered foam dressing. 06-21-2022 upon evaluation today patient appears to be doing well currently in regard to his wound this is measuring smaller and looks well. Fortunately there does not appear to be any signs of active infection locally or systemically at this time which is great news. No fevers, chills, nausea, vomiting, or diarrhea. Objective Constitutional Well-nourished and well-hydrated in no acute distress. Vitals Time Taken: 1:38 PM, Height: 60 in, Temperature: 97.8 F, Pulse: 101 bpm, Respiratory Rate: 16 breaths/min, Blood Pressure: 115/58 mmHg. Respiratory normal breathing without difficulty. Psychiatric this patient is able to make decisions and demonstrates good insight into disease process. Alert and Oriented x 3. pleasant and cooperative. General Notes: Patient's wound bed showed evidence of good granulation and epithelization at this point. Fortunately I see no evidence of infection locally or systemically which is great news and overall I am extremely pleased with where we stand. I did perform chemical cauterization with silver nitrate to clearway some of the hypergranulation tissue. The patient tolerated this without complication and post procedure the appearance is great to the surface of the wound but hopefully should clear up and flatten out a lot allowing for better epithelial growth. Integumentary (Hair, Skin) Wound #3 status is Open. Original cause of wound  was Gradually Appeared. The date acquired was: 09/03/2021. The wound has been in treatment 22 weeks. The wound is located on the Right Ischial Tuberosity. The wound measures 1.6cm length x 4cm width x 0.6cm depth; 5.027cm^2 area and 3.016cm^3 volume. There is Fat Layer (Subcutaneous Tissue) exposed. There is a large amount of serosanguineous drainage noted. There is large (67-100%) red, pink granulation within the wound bed. There is a small (1-33%) amount of necrotic tissue within the wound  bed including Eschar and Adherent Slough. [name], [name] ([id number]) Assessment Active Problems ICD-10 Pressure ulcer of right buttock, stage 4 Pressure ulcer of left buttock, stage 3 Spina bifida occulta Colostomy status Essential (primary) hypertension Atherosclerotic heart disease of native coronary artery without angina pectoris Procedures Wound #3 Pre-procedure diagnosis of Wound #3 is a Pressure Ulcer located on the Right Ischial Tuberosity . An CHEM CAUT GRANULATION TISS procedure was performed by Tommie Sams., PA-C. Post procedure Diagnosis Wound #3: Same as Pre-Procedure Notes: one silver nitrate stick used to right ischial tuberosity Plan Follow-up Appointments: Return Appointment in 2 weeks. Nurse Visit as needed Home Health: [place]: - [facility] ADMIT to [facility] for wound care. May utilize formulary equivalent dressing for wound treatment orders unless otherwise specified. Home Health Nurse may visit PRN to address patient s wound care needs. Scheduled days for dressing changes to be completed; exception, patient has scheduled wound care visit that day. **Please direct any NON-WOUND related issues/requests for orders to patient's Primary Care Physician. **If current dressing causes regression in wound condition, may D/C ordered dressing product/s and apply Normal Saline Moist Dressing daily until next [place] [place] or Other MD appointment. **Notify [facility] of regression in wound condition at [phone]. Bathing/ Shower/ Hygiene: Clean wound with Normal Saline or wound cleanser. - keep dressing dry or change after shower No tub bath. Off-Loading: Gel wheelchair cushion Hospital bed/mattress - Has at home Air fluidized (Group 3) - Has from [employer] and reposition every 2 hours - Do not sit in wheelchair any longer than you have to--offload wound in bed as much as possible Additional Orders / Instructions: Follow Nutritious  Diet and Increase Protein Intake Other: - Refer to ostomy clinic in [facility] if desired Negative Pressure Wound Therapy: Wound #3 Right Ischial Tuberosity: Wound VAC settings at 1102mHg continuous pressure. Use foam to wound cavity. Please order WHITE foam to fill any tunnel/s and/or undermining when necessary. Change VAC dressing 3 X WEEK. Change canister as indicated when full. - Change Monday, Wednesday, and Friday Medications-Please add to medication list.: P.O. Antibiotics - PLEASE pick up you called in antibiotic and start ASAP WOUND #3: - Ischial Tuberosity Wound Laterality: Right Cleanser: [place] Ancillary Kit - 15 Day Supply (Generic) 3 x Per Week/30 Days Discharge Instructions: Use supplies as instructed; Kit contains: (15) Saline Bullets; (15) 3x3 Gauze; 15 pr Gloves Cleanser: Normal Saline 3 x Per Week/30 Days Discharge Instructions: Wash your hands with soap and water. Remove old dressing, discard into plastic bag and place into trash. Cleanse the wound with Normal Saline prior to applying a clean dressing using gauze sponges, not tissues or cotton balls. Do not scrub or use excessive force. Pat dry using gauze sponges, not tissue or cotton balls. Cleanser: Wound Cleanser 3 x Per Week/30 Days Discharge Instructions: Wash your hands with soap and water. Remove old dressing, discard into plastic bag and place into trash. Cleanse the wound with Wound Cleanser prior to applying a clean dressing using gauze sponges,  not tissues or cotton balls. Do not scrub or use excessive force. Pat dry using gauze sponges, not tissue or cotton balls. Primary Dressing: Gauze (Generic) 3 x Per Week/30 Days Discharge Instructions: moistened with dakins Primary Dressing: Hydrofera Blue Ready Transfer Foam, 2.5x2.5 (in/in) (Generic) 3 x Per Week/30 Days AVIR, [name] ([id number]) Discharge Instructions: Apply Hydrofera Blue Ready to wound bed as directed Secondary Dressing: ABD Pad 5x9 (in/in)  (Generic) 3 x Per Week/30 Days Discharge Instructions: Cover with ABD pad Secured With: Medipore Tape - 24M Medipore H Soft Cloth Surgical Tape, 2x2 (in/yd) (Generic) 3 x Per Week/30 Days 1. I am good recommend that we go ahead and continue with the wound care measures as before and the patient is in agreement with plan this includes the use of the [facility] which I think is still doing a great job. 2. We [name] get a continue with ABD pads to cover. Number I am also going to suggest that we have the patient continue to monitor for any signs of infection. Obviously if anything changes he should let me know but right now I really feel like he is doing quite well. We [name] see patient back for reevaluation in 1 week here in the clinic. If anything worsens or changes patient [name] contact our office for additional recommendations. Electronic Signature(s) Signed: 06/21/2022 2:02:28 PM By: Worthy Keeler PA-C Entered By: Worthy Keeler on 06/21/2022 14:02:28 [name] ([id number]) -------------------------------------------------------------------------------- SuperBill Details Patient Name: [name] Date of Service: 06/21/2022 Medical Record Number: [id number] Patient Account Number: [id number] Date of Birth/Sex: 09-01-1966 (55 y.o. M) Treating RN: Carlene Coria Primary Care Provider: Fulton Reek Other Clinician: Massie Kluver Referring Provider: Fulton Reek Treating Provider/Extender: Skipper Cliche in Treatment: 22 Diagnosis Coding ICD-10 Codes Code Description L89.314 Pressure ulcer of right buttock, stage 4 L89.323 Pressure ulcer of left buttock, stage 3 Q76.0 Spina bifida occulta Z93.3 Colostomy status I10 Essential (primary) hypertension I25.10 Atherosclerotic heart disease of native coronary artery without angina pectoris Facility Procedures CPT4 Code: [id number] Description: [place] TISS Modifier: Quantity: 1 CPT4  Code: Description: ICD-10 Diagnosis Description L89.314 Pressure ulcer of right buttock, stage 4 Modifier: Quantity: Physician Procedures CPT4 Code: [id number] Description: 27253 - WC PHYS CHEM CAUT GRAN TISSUE Modifier: Quantity: 1 CPT4 Code: Description: ICD-10 Diagnosis Description L89.314 Pressure ulcer of right buttock, stage 4 Modifier: Quantity: Electronic Signature(s) Signed: 06/21/2022 2:02:44 PM By: Worthy Keeler PA-C Entered By: Worthy Keeler on 06/21/2022 14:02:44

## 2022-06-24 NOTE — Progress Notes (Signed)
AIDDEN, MARKOVIC ([id number]) Visit Report for 06/21/2022 Arrival Information Details Patient Name: [name], [name] Date of Service: 06/21/2022 1:30 PM Medical Record Number: [id number] Patient Account Number: [id number] Date of Birth/Sex: Jan 18, 1966 (56 y.o. M) Treating RN: Carlene Coria Primary Care Abishai Viegas: Fulton Reek Other Clinician: Massie Kluver Referring Leaman Abe: Fulton Reek Treating Sims Laday/Extender: Skipper Cliche in Treatment: 55 Visit Information History Since Last Visit All ordered tests and consults were completed: No Patient Arrived: Wheel Chair Added or deleted any medications: No Arrival Time: 13:33 Any new allergies or adverse reactions: No Transfer Assistance: Transfer Board Had a fall or experienced change in No Patient Requires Transmission-Based Precautions: No activities of daily living that may affect Patient Has Alerts: Yes risk of falls: Patient Alerts: NOT diabetic Hospitalized since last visit: No Pain Present Now: No Electronic Signature(s) Signed: 06/24/2022 11:41:41 AM By: Massie Kluver Entered By: Massie Kluver on 06/21/2022 13:37:56 [name] ([id number]) -------------------------------------------------------------------------------- Clinic Level of Care Assessment Details Patient Name: [name] Date of Service: 06/21/2022 1:30 PM Medical Record Number: [id number] Patient Account Number: [id number] Date of Birth/Sex: Feb 24, 1966 (55 y.o. M) Treating RN: Carlene Coria Primary Care Maeleigh Buschman: Fulton Reek Other Clinician: Massie Kluver Referring Duriel Deery: Fulton Reek Treating Hazelee Harbold/Extender: Skipper Cliche in Treatment: 22 Clinic Level of Care Assessment Items TOOL 1 Quantity Score '[]'  - Use when EandM and Procedure is performed on INITIAL visit 0 ASSESSMENTS - Nursing Assessment / Reassessment '[]'  - General Physical Exam (combine w/ comprehensive assessment (listed just below) when performed on  new 0 pt. evals) '[]'  - 0 Comprehensive Assessment (HX, ROS, Risk Assessments, Wounds Hx, etc.) ASSESSMENTS - Wound and Skin Assessment / Reassessment '[]'  - Dermatologic / Skin Assessment (not related to wound area) 0 ASSESSMENTS - Ostomy and/or Continence Assessment and Care '[]'  - Incontinence Assessment and Management 0 '[]'  - 0 Ostomy Care Assessment and Management (repouching, etc.) PROCESS - Coordination of Care '[]'  - Simple Patient / Family Education for ongoing care 0 '[]'  - 0 Complex (extensive) Patient / Family Education for ongoing care '[]'  - 0 Staff obtains Programmer, systems, Records, Test Results / Process Orders '[]'  - 0 Staff telephones HHA, Nursing Homes / Clarify orders / etc '[]'  - 0 Routine Transfer to another Facility (non-emergent condition) '[]'  - 0 Routine Hospital Admission (non-emergent condition) '[]'  - 0 New Admissions / Biomedical engineer / Ordering NPWT, Apligraf, etc. '[]'  - 0 Emergency Hospital Admission (emergent condition) PROCESS - Special Needs '[]'  - Pediatric / Minor Patient Management 0 '[]'  - 0 Isolation Patient Management '[]'  - 0 Hearing / Language / Visual special needs '[]'  - 0 Assessment of Community assistance (transportation, D/C planning, etc.) '[]'  - 0 Additional assistance / Altered mentation '[]'  - 0 Support Surface(s) Assessment (bed, cushion, seat, etc.) INTERVENTIONS - Miscellaneous '[]'  - External ear exam 0 '[]'  - 0 Patient Transfer (multiple staff / Civil Service fast streamer / Similar devices) '[]'  - 0 Simple Staple / Suture removal (25 or less) '[]'  - 0 Complex Staple / Suture removal (26 or more) '[]'  - 0 Hypo/Hyperglycemic Management (do not check if billed separately) '[]'  - 0 Ankle / Brachial Index (ABI) - do not check if billed separately Has the patient been seen at the hospital within the last three years: Yes Total Score: 0 Level Of Care: ____ [name] ([id number]) Electronic Signature(s) Signed: 06/24/2022 11:41:41 AM By: Massie Kluver Entered By:  Massie Kluver on 06/21/2022 13:57:25 [name] ([id number]) -------------------------------------------------------------------------------- Encounter Discharge Information Details Patient Name: [name] Date of Service: 06/21/2022 1:30 PM Medical Record  Number: [id number] Patient Account Number: [id number] Date of Birth/Sex: [birth year] (56 y.o. M) Treating RN: Carlene Coria Primary Care Kenyatte Chatmon: Fulton Reek Other Clinician: Massie Kluver Referring Savhanna Sliva: Fulton Reek Treating Riley Hallum/Extender: Skipper Cliche in Treatment: 22 Encounter Discharge Information Items Discharge Condition: Stable Ambulatory Status: Wheelchair Discharge Destination: Home Transportation: Other Accompanied By: self Schedule Follow-up Appointment: Yes Clinical Summary of Care: Electronic Signature(s) Signed: 06/24/2022 11:41:41 AM By: Massie Kluver Entered By: Massie Kluver on 06/21/2022 14:10:39 [name] ([id number]) -------------------------------------------------------------------------------- Lower Extremity Assessment Details Patient Name: [name] Date of Service: 06/21/2022 1:30 PM Medical Record Number: [id number] Patient Account Number: [id number] Date of Birth/Sex: 06/01/1966 (55 y.o. M) Treating RN: Carlene Coria Primary Care Kavir Savoca: Fulton Reek Other Clinician: Massie Kluver Referring Wayne Wicklund: Fulton Reek Treating Kendell Gammon/Extender: Skipper Cliche in Treatment: 22 Electronic Signature(s) Signed: 06/21/2022 2:31:54 PM By: Carlene Coria RN Signed: 06/24/2022 11:41:41 AM By: Massie Kluver Entered By: Massie Kluver on 06/21/2022 13:50:48 [name] ([id number]) -------------------------------------------------------------------------------- Multi Wound Chart Details Patient Name: [name] Date of Service: 06/21/2022 1:30 PM Medical Record Number: [id number] Patient Account Number: [id number] Date of Birth/Sex:  [birth year] (56 y.o. M) Treating RN: Carlene Coria Primary Care Mirriam Vadala: Fulton Reek Other Clinician: Massie Kluver Referring Kianni Lheureux: Fulton Reek Treating Katia Hannen/Extender: Skipper Cliche in Treatment: 22 Vital Signs Height(in): 60 Pulse(bpm): 101 Weight(lbs): Blood Pressure(mmHg): 115/58 Body Mass Index(BMI): Temperature(F): 97.8 Respiratory Rate(breaths/min): 16 Photos: [N/A:N/A] Wound Location: Right Ischial Tuberosity N/A N/A Wounding Event: Gradually Appeared N/A N/A Primary Etiology: Pressure Ulcer N/A N/A Comorbid History: Cataracts, Glaucoma, Coronary N/A N/A Artery Disease, Hypertension, History of pressure wounds, Osteoarthritis, Paraplegia Date Acquired: 09/03/2021 N/A N/A Weeks of Treatment: 22 N/A N/A Wound Status: Open N/A N/A Wound Recurrence: No N/A N/A Measurements L x W x D (cm) 1.6x4x0.6 N/A N/A Area (cm) : 5.027 N/A N/A Volume (cm) : 3.016 N/A N/A % Reduction in Area: 71.40% N/A N/A % Reduction in Volume: 93.10% N/A N/A Classification: Category/Stage IV N/A N/A Exudate Amount: Large N/A N/A Exudate Type: Serosanguineous N/A N/A Exudate Color: red, brown N/A N/A Granulation Amount: Large (67-100%) N/A N/A Granulation Quality: Red, Pink N/A N/A Necrotic Amount: Small (1-33%) N/A N/A Necrotic Tissue: Eschar, Adherent Slough N/A N/A Exposed Structures: Fat Layer (Subcutaneous Tissue): N/A N/A Yes Epithelialization: None N/A N/A Treatment Notes Electronic Signature(s) Signed: 06/24/2022 11:41:41 AM By: Massie Kluver Entered By: Massie Kluver on 06/21/2022 13:51:00 [name] ([id number]) -------------------------------------------------------------------------------- Multi-Disciplinary Care Plan Details Patient Name: [name] Date of Service: 06/21/2022 1:30 PM Medical Record Number: [id number] Patient Account Number: [id number] Date of Birth/Sex: 01/19/1966 (55 y.o. M) Treating RN: Carlene Coria Primary Care  Jaycie Kregel: Fulton Reek Other Clinician: Massie Kluver Referring Donovon Micheletti: Fulton Reek Treating Zachery Niswander/Extender: Skipper Cliche in Treatment: 22 Active Inactive Pressure Nursing Diagnoses: Knowledge deficit related to causes and risk factors for pressure ulcer development Knowledge deficit related to management of pressures ulcers Potential for impaired tissue integrity related to pressure, friction, moisture, and shear Goals: Patient will remain free from development of additional pressure ulcers Date Initiated: 01/15/2022 Target Resolution Date: 02/26/2022 Goal Status: Active Patient/caregiver will verbalize risk factors for pressure ulcer development Date Initiated: 01/15/2022 Target Resolution Date: 03/05/2022 Goal Status: Active Interventions: Assess: immobility, friction, shearing, incontinence upon admission and as needed Assess offloading mechanisms upon admission and as needed Assess potential for pressure ulcer upon admission and as needed Notes: Electronic Signature(s) Signed: 06/21/2022 2:31:54 PM By: Carlene Coria RN Signed: 06/24/2022 11:41:41 AM By: Massie Kluver Entered By: Massie Kluver on 06/21/2022 13:50:52  [name], [name] ([id number]) -------------------------------------------------------------------------------- Pain Assessment Details Patient Name: [name], [name] Date of Service: 06/21/2022 1:30 PM Medical Record Number: [id number] Patient Account Number: [id number] Date of Birth/Sex: [birth year] (56 y.o. M) Treating RN: Carlene Coria Primary Care Ka Flammer: Fulton Reek Other Clinician: Massie Kluver Referring Shea Kapur: Fulton Reek Treating Hazelgrace Bonham/Extender: Skipper Cliche in Treatment: 22 Active Problems Location of Pain Severity and Description of Pain Patient Has Paino No Site Locations Pain Management and Medication Current Pain Management: Electronic Signature(s) Signed: 06/21/2022 2:31:54 PM By: Carlene Coria RN Signed:  06/24/2022 11:41:41 AM By: Massie Kluver Entered By: Massie Kluver on 06/21/2022 13:42:16 [name] ([id number]) -------------------------------------------------------------------------------- Patient/Caregiver Education Details Patient Name: [name] Date of Service: 06/21/2022 1:30 PM Medical Record Number: [id number] Patient Account Number: [id number] Date of Birth/Gender: 1965-12-01 (56 y.o. M) Treating RN: Carlene Coria Primary Care Physician: Fulton Reek Other Clinician: Massie Kluver Referring Physician: Fulton Reek Treating Physician/Extender: Skipper Cliche in Treatment: 22 Education Assessment Education Provided To: Patient Education Topics Provided Wound/Skin Impairment: Handouts: Other: continue wound care as directed Electronic Signature(s) Signed: 06/24/2022 11:41:41 AM By: Massie Kluver Entered By: Massie Kluver on 06/21/2022 13:58:47 [name] ([id number]) -------------------------------------------------------------------------------- Wound Assessment Details Patient Name: [name] Date of Service: 06/21/2022 1:30 PM Medical Record Number: [id number] Patient Account Number: [id number] Date of Birth/Sex: 1965-10-17 (55 y.o. M) Treating RN: Carlene Coria Primary Care Jazzmyn Filion: Fulton Reek Other Clinician: Massie Kluver Referring Kimarie Coor: Fulton Reek Treating Naomii Kreger/Extender: Skipper Cliche in Treatment: 22 Wound Status Wound Number: 3 Primary Pressure Ulcer Etiology: Wound Location: Right Ischial Tuberosity Wound Open Wounding Event: Gradually Appeared Status: Date Acquired: 09/03/2021 Comorbid Cataracts, Glaucoma, Coronary Artery Disease, Weeks Of Treatment: 22 History: Hypertension, History of pressure wounds, Osteoarthritis, Clustered Wound: No Paraplegia Photos Wound Measurements Length: (cm) 1.6 Width: (cm) 4 Depth: (cm) 0.6 Area: (cm) 5.027 Volume: (cm) 3.016 % Reduction in Area:  71.4% % Reduction in Volume: 93.1% Epithelialization: None Wound Description Classification: Category/Stage IV Exudate Amount: Large Exudate Type: Serosanguineous Exudate Color: red, brown Foul Odor After Cleansing: No Slough/Fibrino Yes Wound Bed Granulation Amount: Large (67-100%) Exposed Structure Granulation Quality: Red, Pink Fat Layer (Subcutaneous Tissue) Exposed: Yes Necrotic Amount: Small (1-33%) Necrotic Quality: Eschar, Adherent Slough Treatment Notes Wound #3 (Ischial Tuberosity) Wound Laterality: Right Cleanser [place] Ancillary Kit - 15 Day Supply Discharge Instruction: Use supplies as instructed; Kit contains: (15) Saline Bullets; (15) 3x3 Gauze; 15 pr Gloves Normal Saline Discharge Instruction: Wash your hands with soap and water. Remove old dressing, discard into plastic bag and place into trash. Cleanse the wound with Normal Saline prior to applying a clean dressing using gauze sponges, not tissues or cotton balls. Do not scrub or use excessive force. Pat dry using gauze sponges, not tissue or cotton balls. Wound Cleanser [name], [name] ([id number]) Discharge Instruction: Wash your hands with soap and water. Remove old dressing, discard into plastic bag and place into trash. Cleanse the wound with Wound Cleanser prior to applying a clean dressing using gauze sponges, not tissues or cotton balls. Do not scrub or use excessive force. Pat dry using gauze sponges, not tissue or cotton balls. Peri-Wound Care Topical Primary Dressing Gauze Discharge Instruction: moistened with dakins Hydrofera Blue Ready Transfer Foam, 2.5x2.5 (in/in) Discharge Instruction: Apply Hydrofera Blue Ready to wound bed as directed Secondary Dressing ABD Pad 5x9 (in/in) Discharge Instruction: Cover with ABD pad Secured With Medipore Tape - 74M Medipore H Soft Cloth Surgical Tape, 2x2 (in/yd) Compression Wrap Compression Stockings Add-Ons Electronic Signature(s) Signed: 06/21/2022  2:31:54 PM  By: Carlene Coria RN Signed: 06/24/2022 11:41:41 AM By: Massie Kluver Entered By: Massie Kluver on 06/21/2022 13:49:52 [name] ([id number]) -------------------------------------------------------------------------------- Vitals Details Patient Name: [name] Date of Service: 06/21/2022 1:30 PM Medical Record Number: [id number] Patient Account Number: [id number] Date of Birth/Sex: [birth year] (56 y.o. M) Treating RN: Carlene Coria Primary Care Guiselle Mian: Fulton Reek Other Clinician: Massie Kluver Referring Verlin Uher: Fulton Reek Treating Rosann Gorum/Extender: Skipper Cliche in Treatment: 22 Vital Signs Time Taken: 13:38 Temperature (F): 97.8 Height (in): 60 Pulse (bpm): 101 Respiratory Rate (breaths/min): 16 Blood Pressure (mmHg): 115/58 Reference Range: 80 - 120 mg / dl Electronic Signature(s) Signed: 06/24/2022 11:41:41 AM By: Massie Kluver Entered By: Massie Kluver on 06/21/2022 13:42:08

## 2022-07-12 ENCOUNTER — Ambulatory Visit: Payer: Medicare Other | Admitting: Physician Assistant

## 2022-07-19 ENCOUNTER — Ambulatory Visit: Payer: Medicare Other | Admitting: Physician Assistant

## 2022-07-29 ENCOUNTER — Encounter: Payer: Medicare Other | Attending: Physician Assistant | Admitting: Physician Assistant

## 2022-07-29 DIAGNOSIS — Z933 Colostomy status: Secondary | ICD-10-CM | POA: Insufficient documentation

## 2022-07-29 DIAGNOSIS — L89314 Pressure ulcer of right buttock, stage 4: Secondary | ICD-10-CM | POA: Diagnosis present

## 2022-07-29 DIAGNOSIS — N183 Chronic kidney disease, stage 3 unspecified: Secondary | ICD-10-CM | POA: Insufficient documentation

## 2022-07-29 DIAGNOSIS — Q760 Spina bifida occulta: Secondary | ICD-10-CM | POA: Diagnosis not present

## 2022-07-29 DIAGNOSIS — I129 Hypertensive chronic kidney disease with stage 1 through stage 4 chronic kidney disease, or unspecified chronic kidney disease: Secondary | ICD-10-CM | POA: Diagnosis not present

## 2022-07-29 DIAGNOSIS — L89323 Pressure ulcer of left buttock, stage 3: Secondary | ICD-10-CM | POA: Insufficient documentation

## 2022-07-29 DIAGNOSIS — I2510 Atherosclerotic heart disease of native coronary artery without angina pectoris: Secondary | ICD-10-CM | POA: Insufficient documentation

## 2022-07-29 NOTE — Progress Notes (Addendum)
[name], [name] ([id number]) [id number] Page 1 of 8 Visit Report for 07/29/2022 Arrival Information Details Patient Name: Date of Service: [name] [facility], DO [place] LD 07/29/2022 3:15 PM Medical Record Number: [id number] Patient Account Number: [id number] Date of Birth/Sex: Treating RN: [birth year] (56 y.o. [name] Primary Care Markies Mowatt: Fulton Reek Other Clinician: Referring Jru Pense: Treating Kimetha Trulson/Extender: Rupert Stacks in Treatment: 27 Visit Information History Since Last Visit Added or deleted any medications: No Patient Arrived: Wheel Chair Any new allergies or adverse reactions: No Arrival Time: 15:37 Had a fall or experienced change in No Accompanied By: self activities of daily living that may affect Transfer Assistance: Transfer Board risk of falls: Patient Requires Transmission-Based Precautions: No Hospitalized since last visit: No Patient Has Alerts: Yes Pain Present Now: No Patient Alerts: NOT diabetic Electronic Signature(s) Signed: 07/29/2022 4:52:35 PM By: [name] MSN RN CNS WTA Entered By: [name] on 07/29/2022 15:38:17 -------------------------------------------------------------------------------- [facility] [name] of Care Assessment Details Patient Name: Date of ServiceMila [name] [facility], [name] 07/29/2022 3:15 PM Medical Record Number: [id number] Patient Account Number: [id number] Date of Birth/Sex: Treating RN: 1966-07-17 (56 y.o. [name] Primary Care Wing Schoch: Fulton Reek Other Clinician: Referring Catherine Oak: Treating Mima Cranmore/Extender: Rupert Stacks in Treatment: 27 Clinic [name] of Care Assessment Items TOOL 1 Quantity Score '[]'  - 0 Use when EandM and Procedure is performed on INITIAL visit ASSESSMENTS - Nursing Assessment / Reassessment '[]'  - 0 General Physical Exam (combine w/ comprehensive assessment (listed just below) when performed on new pt. evals) '[]'   - 0 Comprehensive Assessment (HX, ROS, Risk Assessments, Wounds Hx, etc.) ASSESSMENTS - Wound and Skin Assessment / Reassessment '[]'  - 0 Dermatologic / Skin Assessment (not related to wound area) ASSESSMENTS - Ostomy and/or Continence Assessment and Care '[]'  - 0 Incontinence Assessment and Management [name], [name] ([id number]) [id number] Page 2 of 8 '[]'  - 0 Ostomy Care Assessment and Management (repouching, etc.) PROCESS - Coordination of Care '[]'  - 0 Simple Patient / Family Education for ongoing care '[]'  - 0 Complex (extensive) Patient / Family Education for ongoing care '[]'  - 0 Staff obtains Programmer, systems, Records, T Results / Process Orders est '[]'  - 0 Staff telephones HHA, Nursing Homes / Clarify orders / etc '[]'  - 0 Routine Transfer to another Facility (non-emergent condition) '[]'  - 0 Routine Hospital Admission (non-emergent condition) '[]'  - 0 New Admissions / Biomedical engineer / Ordering NPWT Apligraf, etc. , '[]'  - 0 Emergency Hospital Admission (emergent condition) PROCESS - Special Needs '[]'  - 0 Pediatric / Minor Patient Management '[]'  - 0 Isolation Patient Management '[]'  - 0 Hearing / Language / Visual special needs '[]'  - 0 Assessment of Community assistance (transportation, D/C planning, etc.) '[]'  - 0 Additional assistance / Altered mentation '[]'  - 0 Support Surface(s) Assessment (bed, cushion, seat, etc.) INTERVENTIONS - Miscellaneous '[]'  - 0 External ear exam '[]'  - 0 Patient Transfer (multiple staff / Civil Service fast streamer / Similar devices) '[]'  - 0 Simple Staple / Suture removal (25 or less) '[]'  - 0 Complex Staple / Suture removal (26 or more) '[]'  - 0 Hypo/Hyperglycemic Management (do not check if billed separately) '[]'  - 0 Ankle / Brachial Index (ABI) - do not check if billed separately Has the patient been seen at the hospital within the last three years: Yes Total Score: 0 [name] Of Care: ____ Electronic Signature(s) Signed: 07/29/2022 4:52:35  PM By: [name] MSN RN CNS WTA Entered By: [name] on 07/29/2022 16:02:45 -------------------------------------------------------------------------------- Encounter Discharge Information  Details Patient Name: Date of ServiceMila [name] [facility], [name] 07/29/2022 3:15 PM Medical Record Number: [id number] Patient Account Number: [id number] Date of Birth/Sex: Treating RN: [birth year] (56 y.o. [name] Primary Care Jaimes Eckert: Fulton Reek Other Clinician: Referring Xavian Hardcastle: Treating Dajanae Brophy/Extender: [name] Weeks in Treatment: 27 Encounter Discharge Information Items Post Procedure Vitals Discharge Condition: Stable Temperature (F): 97.7 Ambulatory Status: Wheelchair Pulse (bpm): [place], [place] ([id number]) [id number] Page 3 of 8 Discharge Destination: Home Respiratory Rate (breaths/min): 18 Transportation: Other Blood Pressure (mmHg): 106/79 Accompanied By: self Schedule Follow-up Appointment: Yes Clinical Summary of Care: Electronic Signature(s) Signed: 07/29/2022 4:52:35 PM By: [name] MSN RN CNS WTA Entered By: [name] on 07/29/2022 16:05:21 -------------------------------------------------------------------------------- Lower Extremity Assessment Details Patient Name: Date of Service: [facility], [name] 07/29/2022 3:15 PM Medical Record Number: [id number] Patient Account Number: [id number] Date of Birth/Sex: Treating RN: 09-29-1966 (56 y.o. [name] Primary Care Meiling Hendriks: Fulton Reek Other Clinician: Referring Shawnice Tilmon: Treating Arwin Bisceglia/Extender: [name] Weeks in Treatment: 27 Electronic Signature(s) Signed: 07/29/2022 4:52:35 PM By: [name] MSN RN CNS WTA Entered By: [name] on 07/29/2022 15:48:23 -------------------------------------------------------------------------------- Multi Wound Chart Details Patient Name: Date of Service: [name] [facility],  [name] 07/29/2022 3:15 PM Medical Record Number: [id number] Patient Account Number: [id number] Date of Birth/Sex: Treating RN: [birth year] (56 y.o. [name] Primary Care Alyx Gee: Fulton Reek Other Clinician: Referring Kedar Sedano: Treating Daisja Kessinger/Extender: [name] Weeks in Treatment: 27 Vital Signs Height(in): 60 Pulse(bpm): 107 Weight(lbs): Blood Pressure(mmHg): 106/79 Body Mass Index(BMI): Temperature(F): 97.7 Respiratory Rate(breaths/min): 18 [3:Photos:] [N/A:N/A] Right Ischial Tuberosity N/A N/A Wound Location: Gradually Appeared N/A N/A Wounding Event: Pressure Ulcer N/A N/A Primary Etiology: Cataracts, Glaucoma, Coronary Artery N/A N/A Comorbid History: Disease, Hypertension, History of pressure wounds, Osteoarthritis, Paraplegia 09/03/2021 N/A N/A Date Acquired: 27 N/A N/A Weeks of Treatment: Open N/A N/A Wound Status: No N/A N/A Wound Recurrence: 1x2.8x0.6 N/A N/A Measurements L x W x D (cm) 2.199 N/A N/A A (cm) : rea 1.319 N/A N/A Volume (cm) : 87.50% N/A N/A % Reduction in A rea: 97.00% N/A N/A % Reduction in Volume: Category/Stage IV N/A N/A Classification: Large N/A N/A Exudate A mount: Serosanguineous N/A N/A Exudate Type: red, brown N/A N/A Exudate Color: Large (67-100%) N/A N/A Granulation A mount: Red, Pink N/A N/A Granulation Quality: Small (1-33%) N/A N/A Necrotic A mount: Eschar, Adherent Slough N/A N/A Necrotic Tissue: Fat Layer (Subcutaneous Tissue): Yes N/A N/A Exposed Structures: None N/A N/A Epithelialization: Treatment Notes Electronic Signature(s) Signed: 07/29/2022 4:52:35 PM By: [name] MSN RN CNS WTA Entered By: [name] on 07/29/2022 15:54:23 -------------------------------------------------------------------------------- Multi-Disciplinary Care Plan Details Patient Name: Date of Service: [name] [facility], [name] 07/29/2022 3:15 PM Medical Record Number: [id number] Patient  Account Number: [id number] Date of Birth/Sex: Treating RN: 03-Oct-1966 (56 y.o. [name] Primary Care Britaney Espaillat: Fulton Reek Other Clinician: Referring Jael Waldorf: Treating Nathali Vent/Extender: Rupert Stacks in Treatment: 27 Active Inactive Pressure Nursing Diagnoses: Knowledge deficit related to causes and risk factors for pressure ulcer development Knowledge deficit related to management of pressures ulcers Potential for impaired tissue integrity related to pressure, friction, moisture, and shear Goals: Patient will remain free from development of additional pressure ulcers Date Initiated: 01/15/2022 Target Resolution Date: 02/26/2022 Goal Status: Active Patient/caregiver will verbalize risk factors for pressure ulcer development Date Initiated: 01/15/2022 Target Resolution Date: 03/05/2022 AENGUS, SAUCEDA ([id number]) [id number] Page 5 of 8 Goal Status: Active  Interventions: Assess: immobility, friction, shearing, incontinence upon admission and as needed Assess offloading mechanisms upon admission and as needed Assess potential for pressure ulcer upon admission and as needed Notes: Electronic Signature(s) Signed: 07/29/2022 4:52:35 PM By: [name] MSN RN CNS WTA Entered By: [name] on 07/29/2022 15:48:28 -------------------------------------------------------------------------------- Pain Assessment Details Patient Name: Date of Service: [name] [facility], [name] 07/29/2022 3:15 PM Medical Record Number: [id number] Patient Account Number: [id number] Date of Birth/Sex: Treating RN: [birth year] (56 y.o. [name] Primary Care Madalynne Gutmann: Fulton Reek Other Clinician: Referring Savio Albrecht: Treating Anaria Kroner/Extender: [name] Weeks in Treatment: 27 Active Problems Location of Pain Severity and Description of Pain Patient Has Paino No Site Locations Rate the pain. Current Pain [name]: 0 Pain  Management and Medication Current Pain Management: Electronic Signature(s) Signed: 07/29/2022 4:52:35 PM By: [name] MSN RN CNS WTA Entered By: [name] on 07/29/2022 15:39:05 [name] ([id number]) [id number] Page 6 of 8 -------------------------------------------------------------------------------- Patient/Caregiver Education Details Patient Name: Date of Service: [name] [facility], DO [place] LD 10/26/2023andnbsp3:15 PM Medical Record Number: [id number] Patient Account Number: [id number] Date of Birth/Gender: Treating RN: 12/04/1965 (56 y.o. [name] Primary Care Physician: Fulton Reek Other Clinician: Referring Physician: Treating Physician/Extender: Rupert Stacks in Treatment: 27 Education Assessment Education Provided To: Patient and Caregiver Education Topics Provided Offloading: Handouts: What is Offloadingo, Other: turn from side to side every 2 hours Methods: Explain/Verbal Responses: State content correctly Wound Debridement: Handouts: Wound Debridement Methods: Explain/Verbal Responses: State content correctly Wound/Skin Impairment: Handouts: Caring for Your Ulcer Methods: Explain/Verbal Responses: State content correctly Electronic Signature(s) Signed: 07/29/2022 4:52:35 PM By: [name] MSN RN CNS WTA Entered By: [name] on 07/29/2022 16:03:47 -------------------------------------------------------------------------------- Wound Assessment Details Patient Name: Date of Service: [name] TH, [name] 07/29/2022 3:15 PM Medical Record Number: [id number] Patient Account Number: [id number] Date of Birth/Sex: Treating RN: Mar 22, 1966 (56 y.o. [name] Primary Care Tiffine Henigan: Fulton Reek Other Clinician: Referring Lateya Dauria: Treating Anneth Brunell/Extender: [name] Weeks in Treatment: 27 Wound Status Wound Number: 3 Primary Pressure Ulcer Etiology: Wound Location:  Right Ischial Tuberosity Wound Open Wounding Event: Gradually [name], [name] ([id number]) [id number] Page 7 of 8 Wounding Event: Gradually Appeared Status: Date Acquired: 09/03/2021 Comorbid Cataracts, Glaucoma, Coronary Artery Disease, Hypertension, Weeks Of Treatment: 27 History: History of pressure wounds, Osteoarthritis, Paraplegia Clustered Wound: No Photos Wound Measurements Length: (cm) 1 Width: (cm) 2.8 Depth: (cm) 0.6 Area: (cm) 2.199 Volume: (cm) 1.319 % Reduction in Area: 87.5% % Reduction in Volume: 97% Epithelialization: None Wound Description Classification: Category/Stage IV Exudate Amount: Large Exudate Type: Serosanguineous Exudate Color: red, brown Foul Odor After Cleansing: No Slough/Fibrino Yes Wound Bed Granulation Amount: Large (67-100%) Exposed Structure Granulation Quality: Red, Pink Fat Layer (Subcutaneous Tissue) Exposed: Yes Necrotic Amount: Small (1-33%) Necrotic Quality: Eschar, Adherent Slough Treatment Notes Wound #3 (Ischial Tuberosity) Wound Laterality: Right Cleanser [place]ram Ancillary Kit - 15 Day Supply Discharge Instruction: Use supplies as instructed; Kit contains: (15) Saline Bullets; (15) 3x3 Gauze; 15 pr Gloves Normal Saline Discharge Instruction: Wash your hands with soap and water. Remove old dressing, discard into plastic bag and place into trash. Cleanse the wound with Normal Saline prior to applying a clean dressing using gauze sponges, not tissues or cotton balls. Do not scrub or use excessive force. Pat dry using gauze sponges, not tissue or cotton balls. Wound Cleanser Discharge Instruction: Wash your hands with soap and water. Remove old dressing, discard into plastic bag and  place into trash. Cleanse the wound with Wound Cleanser prior to applying a clean dressing using gauze sponges, not tissues or cotton balls. Do not scrub or use excessive force. Pat dry using gauze sponges, not  tissue or cotton balls. Peri-Wound Care Topical Primary Dressing Gauze Discharge Instruction: moistened with dakins Hydrofera Blue Ready Transfer Foam, 2.5x2.5 (in/in) Discharge Instruction: Apply Hydrofera Blue Ready to wound bed in a double layer to fill dead space Secondary Dressing ABD Pad 5x9 (in/in) Discharge Instruction: Cover with ABD pad Secured With Medipore T - 77M Medipore H Soft Cloth Surgical T ape ape, 2x2 (in/yd) Compression Wrap Compression Stockings [place], [place] ([id number]) [id number] Page 8 of 8 Add-Ons Notes apply 2 layers of H Blue to fill depth of wound Electronic Signature(s) Signed: 07/29/2022 4:52:35 PM By: [name] MSN RN CNS WTA Entered By: [name] on 07/29/2022 15:48:11 -------------------------------------------------------------------------------- Vitals Details Patient Name: Date of Service: [name] TH, [name] 07/29/2022 3:15 PM Medical Record Number: [id number] Patient Account Number: [id number] Date of Birth/Sex: Treating RN: 27-Jun-1966 (56 y.o. [name] Primary Care Jaquelin Meaney: Fulton Reek Other Clinician: Referring Bettylou Frew: Treating Christy Ehrsam/Extender: [name] Weeks in Treatment: 27 Vital Signs Time Taken: 15:40 Temperature (F): 97.7 Height (in): 60 Pulse (bpm): 107 Respiratory Rate (breaths/min): 18 Blood Pressure (mmHg): 106/79 Reference Range: 80 - 120 mg / dl Electronic Signature(s) Signed: 07/29/2022 4:52:35 PM By: [name] MSN RN CNS WTA Entered By: [name] on 07/29/2022 15:38:53

## 2022-07-29 NOTE — Progress Notes (Addendum)
[name], [name] ([id number]) [id number] Page 1 of 10 Visit Report for 07/29/2022 Chief Complaint Document Details Patient Name: Date of Service: [name] [facility], DO [place] LD 07/29/2022 3:15 PM Medical Record Number: [id number] Patient Account Number: [id number] Date of Birth/Sex: Treating RN: [birth year] (56 y.o. [name] Primary Care Provider: Fulton Reek Other Clinician: Referring Provider: Treating Provider/Extender: [name] Weeks in Treatment: 27 Information Obtained from: Patient Chief Complaint Right ischial tuberosity pressure ulcer Electronic Signature(s) Signed: 07/29/2022 3:11:11 PM By: Worthy Keeler PA-C Entered By: Worthy Keeler on 07/29/2022 15:11:11 -------------------------------------------------------------------------------- Debridement Details Patient Name: Date of Service: [facility], [name] 07/29/2022 3:15 PM Medical Record Number: [id number] Patient Account Number: [id number] Date of Birth/Sex: Treating RN: 1965/11/01 (56 y.o. [name] Primary Care Provider: Fulton Reek Other Clinician: Referring Provider: Treating Provider/Extender: Rupert Stacks in Treatment: 27 Debridement Performed for Assessment: Wound #3 Right Ischial Tuberosity Performed By: Physician Tommie Sams., PA-C Debridement Type: Debridement Level of Consciousness (Pre-procedure): Awake and Alert Pre-procedure Verification/Time Out Yes - 15:55 Taken: Start Time: 15:55 T Area Debrided (L x W): otal 1 (cm) x 2.8 (cm) = 2.8 (cm) Tissue and other material debrided: Viable, Non-Viable, Slough, Subcutaneous, Biofilm, Slough, Hyper-granulation Level: Skin/Subcutaneous Tissue Debridement Description: Excisional Instrument: Curette Bleeding: Moderate Hemostasis Achieved: Pressure End Time: 15:59 Response to Treatment: Procedure was tolerated well Level of Consciousness (Post- Awake and  Alert procedure): [name], Chase ([id number]) [id number] Page 2 of 10 Post Debridement Measurements of Total Wound Length: (cm) 1 Stage: Category/Stage IV Width: (cm) 2.8 Depth: (cm) 0.7 Volume: (cm) 1.539 Character of Wound/Ulcer Post Debridement: Stable Post Procedure Diagnosis Same as Pre-procedure Electronic Signature(s) Signed: 07/29/2022 4:52:35 PM By: [name] MSN RN CNS WTA Signed: 07/29/2022 5:00:04 PM By: Worthy Keeler PA-C Entered By: [name] on 07/29/2022 15:59:54 -------------------------------------------------------------------------------- HPI Details Patient Name: Date of Service: [name] [facility], [name] 07/29/2022 3:15 PM Medical Record Number: [id number] Patient Account Number: [id number] Date of Birth/Sex: Treating RN: 08-07-1966 (56 y.o. [name] Primary Care Provider: Fulton Reek Other Clinician: Referring Provider: Treating Provider/Extender: Rupert Stacks in Treatment: 27 History of Present Illness HPI Description: 56 year old male with a history of spina bifida presenting to [place] with a history of a open wound on the left gluteal region near his upper thigh which she's had for several weeks. He was seen in the ED recently at [place] [facility] and was put on doxycycline and was advised some local care. his past medical history is significant for spinal bifid, neurogenic bladder, kidney stones, sacral pressure sores, constipation. Past surgical history significant for partial cystectomy, ileal conduit, VP shunt removal, percutaneous nephro lithotripsy. In the remote past the patient says he's had some treatment for a ulcer on this area and was treated with a skin graft. Readmission: 10/16/2021 upon evaluation today patient appears to be doing somewhat poorly in regard to a fairly large wound noted over the right ischial tuberosity location. This is a stage IV pressure ulcer. Of note this  is also positive for osteomyelitis upon a CT scan which was performed during the time that he was admitted in the hospital on 09/19/2021. With that being said it is noted in the right inferior buttock that he has a decubitus ulcer which extends to the posterior toe inferior right ischium where there is evidence of osteomyelitis. When he was here in the office actually missed that this was positive I missed read  it and thought it said that there was no osteomyelitis. That is not the case there is in fact osteomyelitis noted here. I actually did review his notes as well in epic. Looking to the discharge summary it appears that the patient was admitted from 09/19/2021 through 09/23/2021. He was discharged home with home health care according to the note. With that being said from what I understand his sister actually takes care of him at this point. He was also to follow-up with a primary care provider and here at the wound care center within a week. I am just now seeing him on the 13th almost a month after discharge. He does have a history of again osteomyelitis of this right hip/ischial tuberosity location. 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 The reason for his admission was actually worsening of the decubitus ulcer upon admission. He does have chronic osteomyelitis of this area. He was treated with empiric antibiotics he had acute kidney injury which improved with IV fluids he also had hypokalemia. Surgical debridement was performed by general surgery at that time and ID was consulted to assist with management according to the notes. IV antibiotics were recommended but patient declined and wanted oral antibiotics at discharge. Therefore no IV antibiotics were planned for discharge time. This case was under the care of Dr. Steva Ready while he was in  the hospital when she did discharge him with a 2-week course of doxycycline and Augmentin according to the notes. It looks like Dr. Doy Hutching office did call and leave a message for the patient to get scheduled for a follow-up visit after hospital discharge I do not see where he showed up in fact it appears that the patient has a no-show letter from Dr. Doy Hutching office noted in epic due to a appointment that was missed on 10/06/2021. Looking at the pictures from Dr. Raelene Bott notes on 09/23/2021 it does appear that the wound is a little bit better as far as the cleanliness of the surface of the wound today but nonetheless still is a very significant wound. 10/26/2021 upon evaluation today patient appears to be doing okay in regard to his wound. Again this is a wound that he did indeed have osteomyelitis and previous. With that being said I do not see any signs of a worsening infection at this time which is good news. Overall I think that we are headed in the right direction although still I think he needs more aggressive offloading. 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 I do believe this would be ideal for him to be honest. Readmission: 01-15-2022 upon evaluation today patient presents for reevaluation he was last seen on October 26, 2021. At that time unfortunately he was having issues with a significant ischial pressure ulcer on the right ischial tuberosity. Subsequently he ended up having decent response to the Dakin's moistened gauze dressing that was previously being utilized and subsequently the patient's sister who is his primary caregiver as well states that she decided just to try to manage this at home. Nonetheless unfortunately this is getting a little larger not better. He does spend a lot of the day in his chair. He does have an air mattress according [place], [name] ([id number]) [id number] Page 3 of 10 what they tell me today. That  was previously ordered.  With that being said I do believe that at this time things do appear to be doing much better from the standpoint of infection I do not see any signs of overt infection. There is also no evidence of systemic infection. No fevers, chills, nausea, vomiting, or diarrhea. With that being said I do believe based on what I am seeing the wound is a bit cleaner and he possibly could be an excellent candidate for a wound VAC. 02-05-2022 upon evaluation today patient appears to be doing about the same in regard to his wound. Unfortunately he is not any better but he also really has not had the wound VAC on sufficiently. There is been some confusion with the orders part of that I think is our follow-up with the way the order was written part of it may be with how the wound VAC is being placed either way we need to see what we can do to try to improve things in this regard. We will clarify the orders today going forward for home health. Unfortunately the patient does have a new wound today which is on the opposite side. This is not good 1 was bad enough have another area to open is definitely not a good thing. Its not as deep but nonetheless is also not very shallow either. We may potentially be able to get this to the point that we could bridge the 2 together in order to wound VAC both but right now want a focus on to try to get the wound VAC going for the main wound initially we will likely use Hydrofera Blue on the new wound.Marland Kitchen 02-19-2022 upon evaluation today patient's wound appears to be doing a little bit worse in the way of maceration fortunately there is no signs of active infection locally or systemically which is great news. No fevers, chills, nausea, vomiting, or diarrhea. 03-05-2022 upon evaluation today patient unfortunately does not appear to be doing nearly as good in regard to his wounds as I would like to see. Fortunately I do not see any evidence of active infection that is  obvious although I am can obtain a wound culture to ensure that there is not anything getting worse here as far as the wound without the wound VAC on his concern. With that being said I will go ahead and get this sent in and evaluated will initiate treatment as needed going forward if necessary. With regard to the wound with the wound VAC this continues to be placed directly over the wound and there is obvious signs of deep tissue injury. The suction being here means that he is sitting on it I think this may be part of the reason why it is coming off although not 536% certain. 03-12-2022 upon evaluation today patient still has not gotten the antibiotics that were called in for him 4 days ago. He tells me that his sister just told him this morning that they were ready but that she has not had a chance to go pick them up. Nonetheless I feel like that this is really something he needs any should have been taken that not the other antibiotics which were not doing the job for him to be honest. The patient states and I completely agree that there is really no way he would have known relying on his sister to let him know obviously. Nonetheless I do believe that he needs to not be taking any other antibiotics and be on the Levaquin as soon as possible. He  tells me that she said she was going to pick them up today. In regard to home health I am hopeful that they actually have been bridging this now. I do think that the wound looks better and this is good news. With that being said I am not certain that I can really tell for sure how this was attached it was removed before he came in and the black foam left in place. I really want him to leave the wound VAC in place until he comes in so that we can monitor and make sure that this is being done correctly he voiced understanding. 03-19-2022 upon evaluation today and much happier with how things appear as far as the patient's wounds are concerned. I think he is on a  much better track and they are doing a better job at bridging although its not quite where I like to see it is still more posterior which I think the patient needs to be a little bit more lateral on his side. Either way this is something that I did marked with a small dot today for him to tell the home health nurses well so she can bridge it to that location also think the tubing should go up instead of going down to just make it less cumbersome overall for him. 04-19-2022 upon evaluation today patient appears to be doing a little better in regard to his wounds. Fortunately I do not see any signs of infection I do believe he is on the right track. The wound VAC does seem to be helping to clean the wound up a bit and bring it in from the base at work. This is good news. 05-10-2022 upon evaluation today patient appears to be doing well currently in regard to his wounds in general in fact of the smaller of the 2 wounds without the wound VAC actually appears to be doing so much better this is almost completely closed and very pleased in that regard. He has been staying off of this he tells me and shows. With that being said the wound with the wound VAC is going require some sharp debridement there is some tissue which is not quite as healthy and we are going to go ahead and continue that for probably 2 more weeks although I think we may be closer to discontinuing this as well since it has healed and quite significantly. 05-24-2022 upon evaluation today patient appears to be doing well with regard to his wound in fact this looks better than I have been expected. This actually I think is a good time to go ahead and switch away from the wound VAC based on what I am seeing. I think we go to [facility] with a bordered foam dressing. 06-21-2022 upon evaluation today patient appears to be doing well currently in regard to his wound this is measuring smaller and looks well. Fortunately there does not appear to be  any signs of active infection locally or systemically at this time which is great news. No fevers, chills, nausea, vomiting, or diarrhea. 07-29-2022 upon evaluation today patient appears to be doing well currently in regard to his wound. This is actually measuring smaller although there is a lot of hypergranulation noted. Fortunately there does not appear to be any signs of active infection locally or systemically at this time. No fevers, chills, nausea, vomiting, or diarrhea. Electronic Signature(s) Signed: 07/29/2022 4:08:39 PM By: Worthy Keeler PA-C Entered By: Worthy Keeler on 07/29/2022 16:08:39 -------------------------------------------------------------------------------- Physical  Exam Details Patient Name: Date of Service[name], [name] 07/29/2022 3:15 PM Medical Record Number: [id number] Patient Account Number: [id number] Date of Birth/Sex: Treating RN: 12-05-1965 (56 y.o. [name] Primary Care Provider: Fulton Reek Other Clinician: Referring Provider: Treating Provider/Extender: Rupert Stacks in Treatment: [address] [place] ([id number]) [id number] Page 4 of 10 Constitutional Well-nourished and well-hydrated in no acute distress. Respiratory normal breathing without difficulty. Psychiatric this patient is able to make decisions and demonstrates good insight into disease process. Alert and Oriented x 3. pleasant and cooperative. Notes Upon inspection patient's wound bed did require sharp debridement clearway some of the necrotic debris including hypergranulation down to better subcutaneous tissue. Overall I am pleased with where we stand today. Electronic Signature(s) Signed: 07/29/2022 4:08:53 PM By: Worthy Keeler PA-C Entered By: Worthy Keeler on 07/29/2022 16:08:53 -------------------------------------------------------------------------------- Physician Orders Details Patient Name: Date of  Service: [name] [facility], [name] 07/29/2022 3:15 PM Medical Record Number: [id number] Patient Account Number: [id number] Date of Birth/Sex: Treating RN: 27-Sep-1966 (56 y.o. [name] Primary Care Provider: Fulton Reek Other Clinician: Referring Provider: Treating Provider/Extender: Rupert Stacks in Treatment: 27 Verbal / Phone Orders: No Diagnosis Coding ICD-10 Coding Code Description L89.314 Pressure ulcer of right buttock, stage 4 L89.323 Pressure ulcer of left buttock, stage 3 Q76.0 Spina bifida occulta Z93.3 Colostomy status I10 Essential (primary) hypertension I25.10 Atherosclerotic heart disease of native coronary artery without angina pectoris Follow-up Appointments Return Appointment in 2 weeks. Nurse Visit as needed [place]: - [facility] ADMIT to [place] for wound care. May utilize formulary equivalent dressing for wound treatment orders unless otherwise specified. Home Health Nurse may visit PRN to address patients wound care needs. Scheduled days for dressing changes to be completed; exception, patient has scheduled wound care visit that day. **Please direct any NON-WOUND related issues/requests for orders to patient's Primary Care Physician. **If current dressing causes regression in wound condition, may D/C ordered dressing product/s and apply Normal Saline Moist Dressing daily until next [place] or Other MD appointment. **Notify W[facility]aling Center of regression in wound condition at [phone]. Bathing/ Shower/ Hygiene Clean wound with Normal Saline or wound cleanser. - keep dressing dry or change after shower No tub bath. Off-Loading Gel wheelchair cushion Hospital bed/mattress - Has at home A fluidized (Group 3) - Has from [place] and reposition every 2 hours - Do not sit in wheelchair any longer than you have to--offload wound in bed as much as possible Additional Orders /  Instructions KEI, MCELHINEY ([id number]) [id number] Page 5 of 10 Follow Nutritious Diet and Increase Protein Intake Other: - Refer to ostomy clinic in [facility] if desired Negative Pressure Wound Therapy Wound #3 Right Ischial Tuberosity Wound VAC settings at 158mHg continuous pressure. Use foam to wound cavity. Please order WHITE foam to fill any tunnel/s and/or undermining when necessary. Change VAC dressing 3 X WEEK. Change canister as indicated when full. - Change Monday, Wednesday, and Friday Medications-Please add to medication list. ntibiotics - PLEASE pick up you called in antibiotic and start ASAP P.O. A Wound Treatment Wound #3 - Ischial Tuberosity Wound Laterality: Right Cleanser: Byram Ancillary Kit - 15 Day Supply (Generic) 3 x Per Week/30 Days Discharge Instructions: Use supplies as instructed; Kit contains: (15) Saline Bullets; (15) 3x3 Gauze; 15 pr Gloves Cleanser: Normal Saline 3 x Per Week/30 Days Discharge Instructions: Wash your hands with soap and water. Remove old  dressing, discard into plastic bag and place into trash. Cleanse the wound with Normal Saline prior to applying a clean dressing using gauze sponges, not tissues or cotton balls. Do not scrub or use excessive force. Pat dry using gauze sponges, not tissue or cotton balls. Cleanser: Wound Cleanser 3 x Per Week/30 Days Discharge Instructions: Wash your hands with soap and water. Remove old dressing, discard into plastic bag and place into trash. Cleanse the wound with Wound Cleanser prior to applying a clean dressing using gauze sponges, not tissues or cotton balls. Do not scrub or use excessive force. Pat dry using gauze sponges, not tissue or cotton balls. Prim Dressing: Gauze (Generic) 3 x Per Week/30 Days ary Discharge Instructions: moistened with dakins Prim Dressing: Hydrofera Blue Ready Transfer Foam, 2.5x2.5 (in/in) (Generic) 3 x Per Week/30 Days ary Discharge  Instructions: Apply Hydrofera Blue Ready to wound bed in a double layer to fill dead space Secondary Dressing: ABD Pad 5x9 (in/in) (Generic) 3 x Per Week/30 Days Discharge Instructions: Cover with ABD pad Secured With: Medipore T - 30M Medipore H Soft Cloth Surgical T ape ape, 2x2 (in/yd) (Generic) 3 x Per Week/30 Days Electronic Signature(s) Signed: 07/29/2022 4:52:35 PM By: [name] MSN RN CNS WTA Signed: 07/29/2022 5:00:04 PM By: Worthy Keeler PA-C Entered By: [name] on 07/29/2022 16:02:37 -------------------------------------------------------------------------------- Problem List Details Patient Name: Date of Service: [name] [facility], [name] 07/29/2022 3:15 PM Medical Record Number: [id number] Patient Account Number: [id number] Date of Birth/Sex: Treating RN: August 26, 1966 (56 y.o. [name] Primary Care Provider: Fulton Reek Other Clinician: Referring Provider: Treating Provider/Extender: [name] Weeks in Treatment: 27 Active Problems ICD-10 Encounter Code Description Active Date MDM Diagnosis L89.314 Pressure ulcer of right buttock, stage 4 01/15/2022 No Yes TREMANE, SPURGEON ([id number]) [id number] Page 6 of 10 [phone] Pressure ulcer of left buttock, stage 3 02/05/2022 No Yes Q76.0 Spina bifida occulta 01/15/2022 No Yes Z93.3 Colostomy status 01/15/2022 No Yes I10 Essential (primary) hypertension 01/15/2022 No Yes I25.10 Atherosclerotic heart disease of native coronary artery without angina pectoris 01/15/2022 No Yes Inactive Problems Resolved Problems Electronic Signature(s) Signed: 07/29/2022 4:52:35 PM By: [name] MSN RN CNS WTA Signed: 07/29/2022 5:00:04 PM By: Worthy Keeler PA-C Previous Signature: 07/29/2022 3:11:05 PM Version By: Worthy Keeler PA-C Entered By: [name] on 07/29/2022 16:03:53 -------------------------------------------------------------------------------- Progress Note  Details Patient Name: Date of Service: [name] [facility], [name] 07/29/2022 3:15 PM Medical Record Number: [id number] Patient Account Number: [id number] Date of Birth/Sex: Treating RN: 17-May-1966 (56 y.o. [name] Primary Care Provider: Fulton Reek Other Clinician: Referring Provider: Treating Provider/Extender: Rupert Stacks in Treatment: 27 Subjective Chief Complaint Information obtained from Patient Right ischial tuberosity pressure ulcer History of Present Illness (HPI) 56 year old male with a history of spina bifida presenting to [place] with a history of a open wound on the left gluteal region near his upper thigh which she's had for several weeks. He was seen in the ED recently at [place] [facility] and was put on doxycycline and was advised some local care. his past medical history is significant for spinal bifid, neurogenic bladder, kidney stones, sacral pressure sores, constipation. Past surgical history significant for partial cystectomy, ileal conduit, VP shunt removal, percutaneous nephro lithotripsy. In the remote past the patient says he's had some treatment for a ulcer on this area and was treated with a skin graft. Readmission: 10/16/2021 upon evaluation today patient appears to be doing somewhat poorly  in regard to a fairly large wound noted over the right ischial tuberosity location. This is a stage IV pressure ulcer. Of note this is also positive for osteomyelitis upon a CT scan which was performed during the time that he was admitted in the hospital on 09/19/2021. With that being said it is noted in the right inferior buttock that he has a decubitus ulcer which extends to the posterior toe inferior right ischium where there is evidence of osteomyelitis. When he was here in the office actually missed that this was positive I missed read it and thought it said that there was no osteomyelitis. That is not the case there is in fact osteomyelitis noted  here. I actually did review his notes as well in epic. Looking to the discharge summary it appears that the patient was admitted from 09/19/2021 through 09/23/2021. He was discharged home with home health care according to the note. With that being said from what I understand his sister actually takes care of him at this LIZZIE, COKLEY ([id number]) [id number] Page 7 of 10 point. He was also to follow-up with a primary care provider and here at the wound care center within a week. I am just now seeing him on the 13th almost a month after discharge. He does have a history of again osteomyelitis of this right hip/ischial tuberosity location. 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 The reason for his admission was actually worsening of the decubitus ulcer upon admission. He does have chronic osteomyelitis of this area. He was treated with empiric antibiotics he had acute kidney injury which improved with IV fluids he also had hypokalemia. Surgical debridement was performed by general surgery at that time and ID was consulted to assist with management according to the notes. IV antibiotics were recommended but patient declined and wanted oral antibiotics at discharge. Therefore no IV antibiotics were planned for discharge time. This case was under the care of Dr. Steva Ready while he was in the hospital when she did discharge him with a 2-week course of doxycycline and Augmentin according to the notes. It looks like Dr. Doy Hutching office did call and leave a message for the patient to get scheduled for a follow-up visit after hospital discharge I do not see where he showed up in fact it appears that the patient has a no-show letter from Dr. Doy Hutching office noted in epic due to a appointment that was missed on 10/06/2021. Looking at the  pictures from Dr. Raelene Bott notes on 09/23/2021 it does appear that the wound is a little bit better as far as the cleanliness of the surface of the wound today but nonetheless still is a very significant wound. 10/26/2021 upon evaluation today patient appears to be doing okay in regard to his wound. Again this is a wound that he did indeed have osteomyelitis and previous. With that being said I do not see any signs of a worsening infection at this time which is good news. Overall I think that we are headed in the right direction although still I think he needs more aggressive offloading. 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 I do believe this would be ideal for him to be honest. Readmission: 01-15-2022 upon evaluation today patient presents for reevaluation he was last seen on October 26, 2021. At  that time unfortunately he was having issues with a significant ischial pressure ulcer on the right ischial tuberosity. Subsequently he ended up having decent response to the Dakin's moistened gauze dressing that was previously being utilized and subsequently the patient's sister who is his primary caregiver as well states that she decided just to try to manage this at home. Nonetheless unfortunately this is getting a little larger not better. He does spend a lot of the day in his chair. He does have an air mattress according what they tell me today. That was previously ordered. With that being said I do believe that at this time things do appear to be doing much better from the standpoint of infection I do not see any signs of overt infection. There is also no evidence of systemic infection. No fevers, chills, nausea, vomiting, or diarrhea. With that being said I do believe based on what I am seeing the wound is a bit cleaner and he possibly could be an excellent candidate for a wound VAC. 02-05-2022 upon evaluation today patient appears to be doing about the same in regard to  his wound. Unfortunately he is not any better but he also really has not had the wound VAC on sufficiently. There is been some confusion with the orders part of that I think is our follow-up with the way the order was written part of it may be with how the wound VAC is being placed either way we need to see what we can do to try to improve things in this regard. We will clarify the orders today going forward for home health. Unfortunately the patient does have a new wound today which is on the opposite side. This is not good 1 was bad enough have another area to open is definitely not a good thing. Its not as deep but nonetheless is also not very shallow either. We may potentially be able to get this to the point that we could bridge the 2 together in order to wound VAC both but right now want a focus on to try to get the wound VAC going for the main wound initially we will likely use Hydrofera Blue on the new wound.Marland Kitchen 02-19-2022 upon evaluation today patient's wound appears to be doing a little bit worse in the way of maceration fortunately there is no signs of active infection locally or systemically which is great news. No fevers, chills, nausea, vomiting, or diarrhea. 03-05-2022 upon evaluation today patient unfortunately does not appear to be doing nearly as good in regard to his wounds as I would like to see. Fortunately I do not see any evidence of active infection that is obvious although I am can obtain a wound culture to ensure that there is not anything getting worse here as far as the wound without the wound VAC on his concern. With that being said I will go ahead and get this sent in and evaluated will initiate treatment as needed going forward if necessary. With regard to the wound with the wound VAC this continues to be placed directly over the wound and there is obvious signs of deep tissue injury. The suction being here means that he is sitting on it I think this may be part of the reason  why it is coming off although not 854% certain. 03-12-2022 upon evaluation today patient still has not gotten the antibiotics that were called in for him 4 days ago. He tells me that his sister just told him this morning that  they were ready but that she has not had a chance to go pick them up. Nonetheless I feel like that this is really something he needs any should have been taken that not the other antibiotics which were not doing the job for him to be honest. The patient states and I completely agree that there is really no way he would have known relying on his sister to let him know obviously. Nonetheless I do believe that he needs to not be taking any other antibiotics and be on the Levaquin as soon as possible. He tells me that she said she was going to pick them up today. In regard to home health I am hopeful that they actually have been bridging this now. I do think that the wound looks better and this is good news. With that being said I am not certain that I can really tell for sure how this was attached it was removed before he came in and the black foam left in place. I really want him to leave the wound VAC in place until he comes in so that we can monitor and make sure that this is being done correctly he voiced understanding. 03-19-2022 upon evaluation today and much happier with how things appear as far as the patient's wounds are concerned. I think he is on a much better track and they are doing a better job at bridging although its not quite where I like to see it is still more posterior which I think the patient needs to be a little bit more lateral on his side. Either way this is something that I did marked with a small dot today for him to tell the home health nurses well so she can bridge it to that location also think the tubing should go up instead of going down to just make it less cumbersome overall for him. 04-19-2022 upon evaluation today patient appears to be doing a little  better in regard to his wounds. Fortunately I do not see any signs of infection I do believe he is on the right track. The wound VAC does seem to be helping to clean the wound up a bit and bring it in from the base at work. This is good news. 05-10-2022 upon evaluation today patient appears to be doing well currently in regard to his wounds in general in fact of the smaller of the 2 wounds without the wound VAC actually appears to be doing so much better this is almost completely closed and very pleased in that regard. He has been staying off of this he tells me and shows. With that being said the wound with the wound VAC is going require some sharp debridement there is some tissue which is not quite as healthy and we are going to go ahead and continue that for probably 2 more weeks although I think we may be closer to discontinuing this as well since it has healed and quite significantly. 05-24-2022 upon evaluation today patient appears to be doing well with regard to his wound in fact this looks better than I have been expected. This actually I think is a good time to go ahead and switch away from the wound VAC based on what I am seeing. I think we go to [facility] with a bordered foam dressing. 06-21-2022 upon evaluation today patient appears to be doing well currently in regard to his wound this is measuring smaller and looks well. Fortunately there does not appear to be  any signs of active infection locally or systemically at this time which is great news. No fevers, chills, nausea, vomiting, or diarrhea. 07-29-2022 upon evaluation today patient appears to be doing well currently in regard to his wound. This is actually measuring smaller although there is a lot of hypergranulation noted. Fortunately there does not appear to be any signs of active infection locally or systemically at this time. No fevers, chills, nausea, vomiting, or diarrhea. [address] MARDELL, CRAGG ([id number])  [id number] Page 8 of 10 Constitutional Well-nourished and well-hydrated in no acute distress. Vitals Time Taken: 3:40 PM, Height: 60 in, Temperature: 97.7 F, Pulse: 107 bpm, Respiratory Rate: 18 breaths/min, Blood Pressure: 106/79 mmHg. Respiratory normal breathing without difficulty. Psychiatric this patient is able to make decisions and demonstrates good insight into disease process. Alert and Oriented x 3. pleasant and cooperative. General Notes: Upon inspection patient's wound bed did require sharp debridement clearway some of the necrotic debris including hypergranulation down to better subcutaneous tissue. Overall I am pleased with where we stand today. Integumentary (Hair, Skin) Wound #3 status is Open. Original cause of wound was Gradually Appeared. The date acquired was: 09/03/2021. The wound has been in treatment 27 weeks. The wound is located on the Right Ischial Tuberosity. The wound measures 1cm length x 2.8cm width x 0.6cm depth; 2.199cm^2 area and 1.319cm^3 volume. There is Fat Layer (Subcutaneous Tissue) exposed. There is a large amount of serosanguineous drainage noted. There is large (67-100%) red, pink granulation within the wound bed. There is a small (1-33%) amount of necrotic tissue within the wound bed including Eschar and Adherent Slough. Assessment Active Problems ICD-10 Pressure ulcer of right buttock, stage 4 Pressure ulcer of left buttock, stage 3 Spina bifida occulta Colostomy status Essential (primary) hypertension Atherosclerotic heart disease of native coronary artery without angina pectoris Procedures Wound #3 Pre-procedure diagnosis of Wound #3 is a Pressure Ulcer located on the Right Ischial Tuberosity . There was a Excisional Skin/Subcutaneous Tissue Debridement with a total area of 2.8 sq cm performed by Tommie Sams., PA-C. With the following instrument(s): Curette to remove Viable and Non-Viable tissue/material.  Material removed includes Subcutaneous Tissue, Slough, Biofilm, and Hyper-granulation. A time out was conducted at 15:55, prior to the start of the procedure. A Moderate amount of bleeding was controlled with Pressure. The procedure was tolerated well. Post Debridement Measurements: 1cm length x 2.8cm width x 0.7cm depth; 1.539cm^3 volume. Post debridement Stage noted as Category/Stage IV. Character of Wound/Ulcer Post Debridement is stable. Post procedure Diagnosis Wound #3: Same as Pre-Procedure Plan Follow-up Appointments: Return Appointment in 2 weeks. Nurse Visit as needed Home Health: [place]: - [facility] ADMIT to [place] for wound care. May utilize formulary equivalent dressing for wound treatment orders unless otherwise specified. Home Health Nurse may visit PRN to address patientoos wound care needs. Scheduled days for dressing changes to be completed; exception, patient has scheduled wound care visit that day. **Please direct any NON-WOUND related issues/requests for orders to patient's Primary Care Physician. **If current dressing causes regression in wound condition, may D/C ordered dressing product/s and apply Normal Saline Moist Dressing daily until next [place] or Other MD appointment. **Notify [facility] of regression in wound condition at [phone]. Bathing/ Shower/ Hygiene: Clean wound with Normal Saline or wound cleanser. - keep dressing dry or change after shower No tub bath. Off-Loading: Gel wheelchair cushion Hospital bed/mattress - Has at home Air fluidized (Group 3) - Has from [employer] and reposition every 2  hours - Do not sit in wheelchair any longer than you have to--offload wound in bed as much as possible Additional Orders / Instructions: Follow Nutritious Diet and Increase Protein Intake Other: - Refer to ostomy clinic in [facility] if desired Negative Pressure Wound Therapy: Wound #3 Right Ischial  Tuberosity: Wound VAC settings at 162mHg continuous pressure. Use foam to wound cavity. Please order WHITE foam to fill any tunnel/s and/or undermining when necessary. Change VAC dressing 3 X WEEK. Change canister as indicated when full. - Change Monday, Wednesday, and Friday Medications-Please add to medication list.: FCOSTANTINO, KOHLBECK([id number] Page 9 of 10 P.O. Antibiotics - PLEASE pick up you called in antibiotic and start ASAP WOUND #3: - Ischial Tuberosity Wound Laterality: Right Cleanser: Byram Ancillary Kit - 15 Day Supply (Generic) 3 x Per Week/30 Days Discharge Instructions: Use supplies as instructed; Kit contains: (15) Saline Bullets; (15) 3x3 Gauze; 15 pr Gloves Cleanser: Normal Saline 3 x Per Week/30 Days Discharge Instructions: Wash your hands with soap and water. Remove old dressing, discard into plastic bag and place into trash. Cleanse the wound with Normal Saline prior to applying a clean dressing using gauze sponges, not tissues or cotton balls. Do not scrub or use excessive force. Pat dry using gauze sponges, not tissue or cotton balls. Cleanser: Wound Cleanser 3 x Per Week/30 Days Discharge Instructions: Wash your hands with soap and water. Remove old dressing, discard into plastic bag and place into trash. Cleanse the wound with Wound Cleanser prior to applying a clean dressing using gauze sponges, not tissues or cotton balls. Do not scrub or use excessive force. Pat dry using gauze sponges, not tissue or cotton balls. Prim Dressing: Gauze (Generic) 3 x Per Week/30 Days ary Discharge Instructions: moistened with dakins Prim Dressing: Hydrofera Blue Ready Transfer Foam, 2.5x2.5 (in/in) (Generic) 3 x Per Week/30 Days ary Discharge Instructions: Apply Hydrofera Blue Ready to wound bed in a double layer to fill dead space Secondary Dressing: ABD Pad 5x9 (in/in) (Generic) 3 x Per Week/30 Days Discharge Instructions: Cover with ABD  pad Secured With: Medipore T - 82M Medipore H Soft Cloth Surgical T ape ape, 2x2 (in/yd) (Generic) 3 x Per Week/30 Days 1. I am going to suggest that we have the patient continue to monitor for any signs of worsening infection. Obviously based on what I am seeing I do believe that we are headed in the right direction I am hopeful that we will be able to continue as such with improvements going forward. 2. I am also can recommend that we have him continue with the [facility] need to cut would be smaller to fit inside and then a second piece over top. 3. I would also suggest we continue with ABD pad to cover secured with tape. We will see patient back for reevaluation in 2 weeks here in the clinic. If anything worsens or changes patient will contact our office for additional recommendations. Electronic Signature(s) Signed: 07/29/2022 4:09:31 PM By: SWorthy KeelerPA-C Entered By: SWorthy Keeleron 07/29/2022 16:09:31 -------------------------------------------------------------------------------- SuperBill Details Patient Name: [name] 07/29/2022 Medical Record Number: [id number] Account Number: [id number] of Birth/Sex: Treating RN: [birth year](56y.o. MVerl BlalockPrimary Care Provider: SFulton ReekOther Clinician: Referring Provider: Treating Provider/Extender: SMaurilio LovelyWeeks in Treatment: 27 Diagnosis Coding ICD-10 Codes Code Description L89.314 Pressure ulcer of right buttock, stage 4 L89.323 Pressure ulcer of left buttock, stage 3  Q76.0 Spina bifida occulta Z93.3 Colostomy status I10 Essential (primary) hypertension I25.10 Atherosclerotic heart disease of native coronary artery without angina pectoris Facility Procedures Physician Procedures : CPT4 Code Description Modifier [id number] - WC PHYS SUBQ TISS 20 SQ CM ICD-10 Diagnosis Description L89.314 Pressure ulcer of right buttock, stage  4 Quantity: 1 Electronic Signature(s) Signed: 07/29/2022 4:10:50 PM By: Worthy Keeler PA-C Entered By: Worthy Keeler on 07/29/2022 16:10:50

## 2022-08-12 ENCOUNTER — Encounter: Payer: Medicare Other | Attending: Physician Assistant | Admitting: Internal Medicine

## 2022-08-12 DIAGNOSIS — L89314 Pressure ulcer of right buttock, stage 4: Secondary | ICD-10-CM | POA: Diagnosis not present

## 2022-08-12 DIAGNOSIS — N319 Neuromuscular dysfunction of bladder, unspecified: Secondary | ICD-10-CM | POA: Diagnosis not present

## 2022-08-12 DIAGNOSIS — I129 Hypertensive chronic kidney disease with stage 1 through stage 4 chronic kidney disease, or unspecified chronic kidney disease: Secondary | ICD-10-CM | POA: Diagnosis not present

## 2022-08-12 DIAGNOSIS — I2510 Atherosclerotic heart disease of native coronary artery without angina pectoris: Secondary | ICD-10-CM | POA: Insufficient documentation

## 2022-08-12 DIAGNOSIS — G8220 Paraplegia, unspecified: Secondary | ICD-10-CM | POA: Diagnosis not present

## 2022-08-12 DIAGNOSIS — L89323 Pressure ulcer of left buttock, stage 3: Secondary | ICD-10-CM | POA: Diagnosis not present

## 2022-08-12 DIAGNOSIS — N183 Chronic kidney disease, stage 3 unspecified: Secondary | ICD-10-CM | POA: Diagnosis not present

## 2022-08-12 DIAGNOSIS — Q760 Spina bifida occulta: Secondary | ICD-10-CM | POA: Insufficient documentation

## 2022-08-12 DIAGNOSIS — Z933 Colostomy status: Secondary | ICD-10-CM | POA: Diagnosis not present

## 2022-08-17 NOTE — Progress Notes (Signed)
[place], [name] ([id number]) [id number] Page 1 of 7 Visit Report for 08/12/2022 Arrival Information Details Patient Name: Date of Service: [name] [facility] [name], [name] 08/12/2022 2:15 PM Medical Record Number: [id number] Patient Account Number: [id number] Date of Birth/Sex: Treating RN: 11/24/1965 (56 y.o. [name], [name] Primary Care [name]: [name] Other Clinician: Massie Kluver Referring [name]: Treating Keli Buehner/Extender: RO BSO [name], [name] [name] in Treatment: 7 Visit Information History Since Last Visit All ordered tests and consults were completed: No Patient Arrived: Wheel Chair Added or deleted any medications: No Arrival Time: 14:41 Any new allergies or adverse reactions: No Transfer Assistance: Transfer Board Had a fall or experienced change in No Patient Requires Transmission-Based Precautions: No activities of daily living that may affect Patient Has Alerts: Yes risk of falls: Patient Alerts: NOT diabetic Signs or symptoms of abuse/neglect since last visito No Hospitalized since last visit: No Implantable device outside of the clinic excluding No cellular tissue based products placed in the center since last visit: Pain Present Now: No Electronic Signature(s) Signed: 08/17/2022 7:52:55 AM By: Massie Kluver Entered By: Massie Kluver on 08/12/2022 14:41:27 -------------------------------------------------------------------------------- Clinic Level of Care Assessment Details Patient Name: Date of ServiceMila [name], Chase NA [name] 08/12/2022 2:15 PM Medical Record Number: [id number] Patient Account Number: [id number] Date of Birth/Sex: Treating RN: Oct 02, 1966 (56 y.o. [name]: [name] Other Clinician: Massie Kluver Referring Mitsuru Dault: Treating Lydell Moga/Extender: RO BSO N, [name] [name] in Treatment: 29 Clinic Level of Care Assessment Items TOOL 1 Quantity  Score '[]'$  - 0 Use when EandM and Procedure is performed on INITIAL visit ASSESSMENTS - Nursing Assessment / Reassessment '[]'$  - 0 General Physical Exam (combine w/ comprehensive assessment (listed just below) when performed on new pt. evals) '[]'$  - 0 Comprehensive Assessment (HX, ROS, Risk Assessments, Wounds Hx, etc.) [name], [name] ([id number]) [id number] Page 2 of 7 ASSESSMENTS - Wound and Skin Assessment / Reassessment '[]'$  - 0 Dermatologic / Skin Assessment (not related to wound area) ASSESSMENTS - Ostomy and/or Continence Assessment and Care '[]'$  - 0 Incontinence Assessment and Management '[]'$  - 0 Ostomy Care Assessment and Management (repouching, etc.) PROCESS - Coordination of Care '[]'$  - 0 Simple Patient / Family Education for ongoing care '[]'$  - 0 Complex (extensive) Patient / Family Education for ongoing care '[]'$  - 0 Staff obtains Programmer, systems, Records, T Results / Process Orders est '[]'$  - 0 Staff telephones HHA, Nursing Homes / Clarify orders / etc '[]'$  - 0 Routine Transfer to another Facility (non-emergent condition) '[]'$  - 0 Routine Hospital Admission (non-emergent condition) '[]'$  - 0 New Admissions / Biomedical engineer / Ordering NPWT Apligraf, etc. , '[]'$  - 0 Emergency Hospital Admission (emergent condition) PROCESS - Special Needs '[]'$  - 0 Pediatric / Minor Patient Management '[]'$  - 0 Isolation Patient Management '[]'$  - 0 Hearing / Language / Visual special needs '[]'$  - 0 Assessment of Community assistance (transportation, D/C planning, etc.) '[]'$  - 0 Additional assistance / Altered mentation '[]'$  - 0 Support Surface(s) Assessment (bed, cushion, seat, etc.) INTERVENTIONS - Miscellaneous '[]'$  - 0 External ear exam '[]'$  - 0 Patient Transfer (multiple staff / Civil Service fast streamer / Similar devices) '[]'$  - 0 Simple Staple / Suture removal (25 or less) '[]'$  - 0 Complex Staple / Suture removal (26 or more) '[]'$  - 0 Hypo/Hyperglycemic Management (Chase not check if billed  separately) '[]'$  - 0 Ankle / Brachial Index (ABI) - Chase not check if billed separately Has the patient been  seen at the hospital within the last three years: Yes Total Score: 0 Level Of Care: ____ Electronic Signature(s) Signed: 08/17/2022 7:52:55 AM By: Massie Kluver Entered By: Massie Kluver on 08/12/2022 15:10:42 -------------------------------------------------------------------------------- Lower Extremity Assessment Details Patient Name: Date of ServiceMila [name] [facility], Chase NA [name] 08/12/2022 2:15 PM Medical Record Number: [id number] Patient Account Number: [id number] Date of Birth/Sex: Treating RN: August 05, 1966 (56 y.o. [name] Primary Care [name]: [name] Other Clinician: Massie Kluver Referring Dioselina Brumbaugh: Treating Merwyn Hodapp/Extender: Eldridge Dace, [name] [name] in Treatment: [address] [place] ([id number]) [id number] Page 3 of 7 Electronic Signature(s) Signed: 08/12/2022 5:19:22 PM By: Gretta Cool, BSN, RN, CWS, Kim RN, BSN Signed: 08/17/2022 7:52:55 AM By: Massie Kluver Entered By: Massie Kluver on 08/12/2022 15:02:36 -------------------------------------------------------------------------------- Multi Wound Chart Details Patient Name: Date of Service: [name] TH, Chase NA [name] 08/12/2022 2:15 PM Medical Record Number: [id number] Patient Account Number: [id number] Date of Birth/Sex: Treating RN: May 26, 1966 (56 y.o. [name] Primary Care Jan Olano: [name] Other Clinician: Massie Kluver Referring Martika Egler: Treating Chasin Findling/Extender: RO BSO N, Dennis [name] [name] in Treatment: 29 Vital Signs Height(in): 60 Pulse(bpm): 20 Weight(lbs): Blood Pressure(mmHg): 119/76 Body Mass Index(BMI): Temperature(F): 97.4 Respiratory Rate(breaths/min): 18 [3:Photos:] [N/A:N/A] Right Ischial Tuberosity N/A N/A Wound Location: Gradually Appeared N/A N/A Wounding Event: Pressure Ulcer N/A N/A Primary  Etiology: Cataracts, Glaucoma, Coronary Artery N/A N/A Comorbid History: Disease, Hypertension, History of pressure wounds, Osteoarthritis, Paraplegia 09/03/2021 N/A N/A Date Acquired: 29 N/A N/A Weeks of Treatment: Open N/A N/A Wound Status: No N/A N/A Wound Recurrence: 2x4x1.1 N/A N/A Measurements L x W x D (cm) 6.283 N/A N/A A (cm) : rea 6.912 N/A N/A Volume (cm) : 64.30% N/A N/A % Reduction in A rea: 84.30% N/A N/A % Reduction in Volume: Category/Stage IV N/A N/A Classification: Large N/A N/A Exudate A mount: Serosanguineous N/A N/A Exudate Type: red, brown N/A N/A Exudate Color: Large (67-100%) N/A N/A Granulation A mount: Red, Pink N/A N/A Granulation Quality: Small (1-33%) N/A N/A Necrotic A mount: Eschar, Adherent Slough N/A N/A Necrotic Tissue: Fat Layer (Subcutaneous Tissue): Yes N/A N/A Exposed Structures: None N/A N/A Epithelialization: Treatment Notes [name], [name] ([id number]) [id number] Page 4 of 7 Electronic Signature(s) Signed: 08/17/2022 7:52:55 AM By: Massie Kluver Entered By: Massie Kluver on 08/12/2022 15:04:16 -------------------------------------------------------------------------------- Multi-Disciplinary Care Plan Details Patient Name: Date of Service: [name] TH, Chase NA [name] 08/12/2022 2:15 PM Medical Record Number: [id number] Patient Account Number: [id number] Date of Birth/Sex: Treating RN: [birth year] (56 y.o. [name], [name] Primary Care [name]: [name] Other Clinician: Massie Kluver Referring Cairo Lingenfelter: Treating Treena Cosman/Extender: RO BSO N, [name] [name] in Treatment: 29 Active Inactive Pressure Nursing Diagnoses: Knowledge deficit related to causes and risk factors for pressure ulcer development Knowledge deficit related to management of pressures ulcers Potential for impaired tissue integrity related to pressure, friction, moisture, and shear Goals: Patient  will remain free from development of additional pressure ulcers Date Initiated: 01/15/2022 Target Resolution Date: 02/26/2022 Goal Status: Active Patient/caregiver will verbalize risk factors for pressure ulcer development Date Initiated: 01/15/2022 Target Resolution Date: 03/05/2022 Goal Status: Active Interventions: Assess: immobility, friction, shearing, incontinence upon admission and as needed Assess offloading mechanisms upon admission and as needed Assess potential for pressure ulcer upon admission and as needed Notes: Electronic Signature(s) Signed: 08/12/2022 5:19:22 PM By: Gretta Cool, BSN, RN, CWS, Kim RN, BSN Signed: 08/17/2022 7:52:55 AM By: Massie Kluver Entered By: Massie Kluver on 08/12/2022 15:02:39 -------------------------------------------------------------------------------- Pain Assessment  Details Patient Name: Date of ServiceMila [name], Chase NA [name] 08/12/2022 2:15 PM Medical Record Number: [id number] Patient Account Number: [id number] [name], [name] ([id number]) [id number] Page 5 of 7 Date of Birth/Sex: Treating RN: 1965-10-08 (56 y.o. [name] Primary Care [name]: [name] Other Clinician: Massie Kluver Referring Ettamae Barkett: Treating Dannon Perlow/Extender: RO BSO N, [name] [name] in Treatment: 29 Active Problems Location of Pain Severity and Description of Pain Patient Has Paino No Site Locations Pain Management and Medication Current Pain Management: Electronic Signature(s) Signed: 08/12/2022 5:19:22 PM By: Gretta Cool, BSN, RN, CWS, Kim RN, BSN Signed: 08/17/2022 7:52:55 AM By: Massie Kluver Entered By: Massie Kluver on 08/12/2022 14:42:09 -------------------------------------------------------------------------------- Patient/Caregiver Education Details Patient Name: Date of Service: [name] TH, Chase NA [name] 11/9/2023andnbsp2:15 PM Medical Record Number: [id number] Patient Account Number: [id number] Date of  Birth/Gender: Treating RN: [birth year] (56 y.o. [name] Primary Care Physician: [name] Other Clinician: Massie Kluver Referring Physician: Treating Physician/Extender: RO BSO [name], [name] in Treatment: 29 Education Assessment Education Provided To: Patient Education Topics Provided Wound/Skin Impairment: Handouts: Other: continue wound care as directed Methods: Explain/Verbal Responses: State content correctly [employer]) [place], [name] ([id number]) [id number] Page 6 of 7 Signed: 08/17/2022 7:52:55 AM By: Massie Kluver Entered By: Massie Kluver on 08/12/2022 15:11:23 -------------------------------------------------------------------------------- Wound Assessment Details Patient Name: Date of ServiceMila [name] [facility], Chase NA [name] 08/12/2022 2:15 PM Medical Record Number: [id number] Patient Account Number: [id number] Date of Birth/Sex: Treating RN: 09-20-1966 (56 y.o. [name], [name] Primary Care Hovanes Hymas: [name] Other Clinician: Massie Kluver Referring Orenthal Debski: Treating Michaelah Credeur/Extender: RO BSO N, [name] [name] in Treatment: 29 Wound Status Wound Number: 3 Primary Pressure Ulcer Etiology: Wound Location: Right Ischial Tuberosity Wound Open Wounding Event: Gradually Appeared Status: Date Acquired: 09/03/2021 Comorbid Cataracts, Glaucoma, Coronary Artery Disease, Hypertension, Weeks Of Treatment: 29 History: History of pressure wounds, Osteoarthritis, Paraplegia Clustered Wound: No Photos Wound Measurements Length: (cm) 2 Width: (cm) 4 Depth: (cm) 1.1 Area: (cm) 6.283 Volume: (cm) 6.912 % Reduction in Area: 64.3% % Reduction in Volume: 84.3% Epithelialization: None Tunneling: No Undermining: No Wound Description Classification: Category/Stage IV Exudate Amount: Large Exudate Type: Serosanguineous Exudate Color: red, brown Foul Odor After Cleansing:  No Slough/Fibrino Yes Wound Bed Granulation Amount: Large (67-100%) Exposed Structure Granulation Quality: Red, Pink Fat Layer (Subcutaneous Tissue) Exposed: Yes Necrotic Amount: Small (1-33%) Necrotic Quality: Eschar, Adherent Therapist, music) Signed: 08/12/2022 5:19:22 PM By: Gretta Cool, BSN, RN, CWS, Kim RN, BSN Signed: 08/17/2022 7:52:55 AM By: Massie Kluver Entered By: Massie Kluver on 08/12/2022 14:57:56 [name] ([id number]) [id number] Page 7 of 7 -------------------------------------------------------------------------------- Vitals Details Patient Name: Date of ServiceMila [name] [facility], Chase NA [name] 08/12/2022 2:15 PM Medical Record Number: [id number] Patient Account Number: [id number] Date of Birth/Sex: Treating RN: June 27, 1966 (56 y.o. [name], [name] Primary Care Paiden Caraveo: [name] Other Clinician: Massie Kluver Referring Jazminn Pomales: Treating Ambri Miltner/Extender: RO BSO N, [name] [name] in Treatment: 29 Vital Signs Time Taken: 14:42 Temperature (F): 97.4 Height (in): 60 Pulse (bpm): 93 Respiratory Rate (breaths/min): 18 Blood Pressure (mmHg): 119/76 Reference Range: 80 - 120 mg / dl Electronic Signature(s) Signed: 08/17/2022 7:52:55 AM By: Massie Kluver Entered By: Massie Kluver on 08/12/2022 14:52:36

## 2022-08-17 NOTE — Progress Notes (Signed)
[name], [name] ([id number]) [id number] Page 1 of 9 Visit Report for 08/12/2022 Debridement Details Patient Name: Date of Service: [name] [facility], [name] 08/12/2022 2:15 PM Medical Record Number: [id number] Patient Account Number: [id number] Date of Birth/Sex: Treating RN: 1966/02/27 (56 y.o. [name] Primary Care Provider: Fulton Reek Other Clinician: Massie Kluver Referring Provider: Treating Provider/Extender: RO BSO Delane Ginger, MICHA EL Ginny Forth in Treatment: 29 Debridement Performed for Assessment: Wound #3 Right Ischial Tuberosity Performed By: Physician Ricard Dillon, MD Debridement Type: Debridement Level of Consciousness (Pre-procedure): Awake and Alert Pre-procedure Verification/Time Out Yes - 15:03 Taken: Start Time: 15:03 T Area Debrided (L x W): otal 2 (cm) x 4 (cm) = 8 (cm) Tissue and other material debrided: Viable, Non-Viable, Slough, Subcutaneous, Skin: Dermis , Skin: Epidermis, Slough Level: Skin/Subcutaneous Tissue Debridement Description: Excisional Instrument: Curette, Forceps, Scissors Bleeding: Moderate Hemostasis Achieved: Silver Nitrate Response to Treatment: Procedure was tolerated well Level of Consciousness (Post- Awake and Alert procedure): Post Debridement Measurements of Total Wound Length: (cm) 2 Stage: Category/Stage IV Width: (cm) 4 Depth: (cm) 1.3 Volume: (cm) 8.168 Character of Wound/Ulcer Post Debridement: Stable Post Procedure Diagnosis Same as Pre-procedure Electronic Signature(s) Signed: 08/12/2022 3:56:39 PM By: Linton Ham MD Signed: 08/12/2022 5:19:22 PM By: Gretta Cool, BSN, RN, CWS, Kim RN, BSN Signed: 08/17/2022 7:52:55 AM By: Massie Kluver Entered By: Massie Kluver on 08/12/2022 15:06:17 -------------------------------------------------------------------------------- HPI Details Patient Name: Date of Service: [name] [place], [name] 08/12/2022 2:15 PM Medical Record Number:  [id number] Patient Account Number: [id number] Chase, [name] ([id number]) [id number] Page 2 of 9 Date of Birth/Sex: Treating RN: 07/26/1966 (56 y.o. [name] Primary Care Provider: Fulton Reek Other Clinician: Massie Kluver Referring Provider: Treating Provider/Extender: RO BSO N, MICHA EL Ginny Forth in Treatment: 29 History of Present Illness HPI Description: 56 year old male with a history of spina bifida presenting to [place] with a history of a open wound on the left gluteal region near his upper thigh which she's had for several weeks. He was seen in the ED recently at [facility] and was put on doxycycline and was advised some local care. his past medical history is significant for spinal bifid, neurogenic bladder, kidney stones, sacral pressure sores, constipation. Past surgical history significant for partial cystectomy, ileal conduit, VP shunt removal, percutaneous nephro lithotripsy. In the remote past the patient says he's had some treatment for a ulcer on this area and was treated with a skin graft. Readmission: 10/16/2021 upon evaluation today patient appears to be doing somewhat poorly in regard to a fairly large wound noted over the right ischial tuberosity location. This is a stage IV pressure ulcer. Of note this is also positive for osteomyelitis upon a CT scan which was performed during the time that he was admitted in the hospital on 09/19/2021. With that being said it is noted in the right inferior buttock that he has a decubitus ulcer which extends to the posterior toe inferior right ischium where there is evidence of osteomyelitis. When he was here in the office actually missed that this was positive I missed read it and thought it said that there was no osteomyelitis. That is not the case there is in fact osteomyelitis noted here. I actually did review his notes as well in epic. Looking to the discharge summary it  appears that the patient was admitted from 09/19/2021 through 09/23/2021. He was discharged home with home health care according to the note. With that being said from  what I understand his sister actually takes care of him at this point. He was also to follow-up with a primary care provider and here at the wound care center within a week. I am just now seeing him on the 13th almost a month after discharge. He does have a history of again osteomyelitis of this right hip/ischial tuberosity location. 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 The reason for his admission was actually worsening of the decubitus ulcer upon admission. He does have chronic osteomyelitis of this area. He was treated with empiric antibiotics he had acute kidney injury which improved with IV fluids he also had hypokalemia. Surgical debridement was performed by general surgery at that time and ID was consulted to assist with management according to the notes. IV antibiotics were recommended but patient declined and wanted oral antibiotics at discharge. Therefore no IV antibiotics were planned for discharge time. This case was under the care of Dr. Steva Ready while he was in the hospital when she did discharge him with a 2-week course of doxycycline and Augmentin according to the notes. It looks like Dr. Doy Hutching office did call and leave a message for the patient to get scheduled for a follow-up visit after hospital discharge I do not see where he showed up in fact it appears that the patient has a no-show letter from Dr. Doy Hutching office noted in epic due to a appointment that was missed on 10/06/2021. Looking at the pictures from Dr. Raelene Bott notes on 09/23/2021 it does appear that the wound is a little bit better as far as the cleanliness of the surface of the wound today but  nonetheless still is a very significant wound. 10/26/2021 upon evaluation today patient appears to be doing okay in regard to his wound. Again this is a wound that he did indeed have osteomyelitis and previous. With that being said I do not see any signs of a worsening infection at this time which is good news. Overall I think that we are headed in the right direction although still I think he needs more aggressive offloading. 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 I do believe this would be ideal for him to be honest. Readmission: 01-15-2022 upon evaluation today patient presents for reevaluation he was last seen on October 26, 2021. At that time unfortunately he was having issues with a significant ischial pressure ulcer on the right ischial tuberosity. Subsequently he ended up having decent response to the Dakin's moistened gauze dressing that was previously being utilized and subsequently the patient's sister who is his primary caregiver as well states that she decided just to try to manage this at home. Nonetheless unfortunately this is getting a little larger not better. He does spend a lot of the day in his chair. He does have an air mattress according what they tell me today. That was previously ordered. With that being said I do believe that at this time things do appear to be doing much better from the standpoint of infection I do not see any signs of overt infection. There is also no evidence of systemic infection. No fevers, chills, nausea, vomiting, or diarrhea. With that being said I do believe based on what I am seeing the wound is a bit cleaner and he possibly could be an excellent  candidate for a wound VAC. 02-05-2022 upon evaluation today patient appears to be doing about the same in regard to his wound. Unfortunately he is not any better but he also really has not had the wound VAC on sufficiently. There is been some confusion with the orders part of that I  think is our follow-up with the way the order was written part of it may be with how the wound VAC is being placed either way we need to see what we can do to try to improve things in this regard. We will clarify the orders today going forward for home health. Unfortunately the patient does have a new wound today which is on the opposite side. This is not good 1 was bad enough have another area to open is definitely not a good thing. Its not as deep but nonetheless is also not very shallow either. We may potentially be able to get this to the point that we could bridge the 2 together in order to wound VAC both but right now want a focus on to try to get the wound VAC going for the main wound initially we will likely use Hydrofera Blue on the new wound.Marland Kitchen 02-19-2022 upon evaluation today patient's wound appears to be doing a little bit worse in the way of maceration fortunately there is no signs of active infection locally or systemically which is great news. No fevers, chills, nausea, vomiting, or diarrhea. 03-05-2022 upon evaluation today patient unfortunately does not appear to be doing nearly as good in regard to his wounds as I would like to see. Fortunately I do not see any evidence of active infection that is obvious although I am can obtain a wound culture to ensure that there is not anything getting worse here as far as the wound without the wound VAC on his concern. With that being said I will go ahead and get this sent in and evaluated will initiate treatment as needed going forward if necessary. With regard to the wound with the wound VAC this continues to be placed directly over the wound and there is obvious signs of deep tissue injury. The suction being here means that he is sitting on it I think this may be part of the reason why it is coming off although not 850% certain. 03-12-2022 upon evaluation today patient still has not gotten the antibiotics that were called in for him 4 days ago. He  tells me that his sister just told him this morning that they were ready but that she has not had a chance to go pick them up. Nonetheless I feel like that this is really something he needs any should have been taken that not the other antibiotics which were not doing the job for him to be honest. The patient states and I completely agree that there is really no way he would have known relying on his sister to let him know obviously. Nonetheless I do believe that he needs to not be taking any other antibiotics and be on the Levaquin as soon as possible. He tells me that she said she was going to pick them up today. In regard to home health I am hopeful that they actually have been bridging this now. I do think that the wound looks better and this is good news. With that being said I am not certain that I can really tell for sure how this was attached it was removed before he came in and the black foam left  in place. I really want him to leave the wound VAC in place until he comes in so that we can monitor and make sure that this is being done correctly he voiced understanding. 03-19-2022 upon evaluation today and much happier with how things appear as far as the patient's wounds are concerned. I think he is on a much better track and they are doing a better job at bridging although its not quite where I like to see it is still more posterior which I think the patient needs to be a little bit more lateral on his side. Either way this is something that I did marked with a small dot today for him to tell the home health nurses well so she can bridge it to that location also think the tubing should go up instead of going down to just make it less cumbersome overall for him. 04-19-2022 upon evaluation today patient appears to be doing a little better in regard to his wounds. Fortunately I do not see any signs of infection I do believe he is on the right track. The wound VAC does seem to be helping to clean the  wound up a bit and bring it in from [place] base [place]. This is good news. 05-10-2022 upon evaluation today patient appears to be doing well currently in regard to his wounds in general in fact of the smaller of the 2 wounds without the [place], [place] ([id number]) [id number] Page 3 of 9 wound VAC actually appears to be doing so much better this is almost completely closed and very pleased in that regard. He has been staying off of this he tells me and shows. With that being said the wound with the wound VAC is going require some sharp debridement there is some tissue which is not quite as healthy and we are going to go ahead and continue that for probably 2 more weeks although I think we may be closer to discontinuing this as well since it has healed and quite significantly. 05-24-2022 upon evaluation today patient appears to be doing well with regard to his wound in fact this looks better than I have been expected. This actually I think is a good time to go ahead and switch away from the wound VAC based on what I am seeing. I think we go to [facility] with a bordered foam dressing. 06-21-2022 upon evaluation today patient appears to be doing well currently in regard to his wound this is measuring smaller and looks well. Fortunately there does not appear to be any signs of active infection locally or systemically at this time which is great news. No fevers, chills, nausea, vomiting, or diarrhea. 07-29-2022 upon evaluation today patient appears to be doing well currently in regard to his wound. This is actually measuring smaller although there is a lot of hypergranulation noted. Fortunately there does not appear to be any signs of active infection locally or systemically at this time. No fevers, chills, nausea, vomiting, or diarrhea. 11/9; pressure ulcer on the right buttock in the setting of spina bifida and lower extremity paralysis. He has been using Architectural technologist) Signed: 08/12/2022 3:56:39 PM By: Linton Ham MD Entered By: Linton Ham on 08/12/2022 15:25:16 -------------------------------------------------------------------------------- Physical Exam Details Patient Name: Date of Service: [name] [place], [name] 08/12/2022 2:15 PM Medical Record Number: [id number] Patient Account Number: [id number] Date of Birth/Sex: Treating RN: 30-Jan-1966 (56 y.o. [name] Primary Care Provider: Fulton Reek Other Clinician: Massie Kluver  Referring Provider: Treating Provider/Extender: RO BSO N, Bessie EL Bo Merino Weeks in Treatment: 29 Constitutional Sitting or standing Blood Pressure is within target range for patient.. Pulse regular and within target range for patient.Marland Kitchen Respirations regular, non-labored and within target range.. Temperature is normal and within the target range for the patient.Marland Kitchen appears in no distress. Notes Wound exam; the right ischial tuberosity. The wound is hyper granulated but irregular. Some of it protruding out of the wound and some of it at skin level. I used pickups and scissors to remove some of the hyper granulated tissue and then a #5 curette to attempt to get to a level surface. This cleans up quite nicely. On the medial aspect of this there is a small probing area that is deeper but it does not go to bone. There is no evidence of infection Electronic Signature(s) Signed: 08/12/2022 3:56:39 PM By: Linton Ham MD Entered By: Linton Ham on 08/12/2022 15:27:13 -------------------------------------------------------------------------------- Physician Orders Details Patient Name: Date of Service: [name] [place], [name] 08/12/2022 2:15 PM Medical Record Number: [id number] Patient Account Number: [id number] Date of Birth/Sex: Treating RN: Dec 07, 1965 (56 y.o. [name] Primary Care Provider: Fulton Reek Other Clinician: Massie Kluver [place], [name] ([id number])  [id number] Page 4 of 9 Referring Provider: Treating Provider/Extender: RO BSO N, MICHA EL Ginny Forth in Treatment: 29 Verbal / Phone Orders: No Diagnosis Coding Follow-up Appointments Return Appointment in 2 weeks. Nurse Visit as needed [place]: - [facility] ADMIT to [facility] for wound care. May utilize formulary equivalent dressing for wound treatment orders unless otherwise specified. Home Health Nurse may visit PRN to address patients wound care needs. Scheduled days for dressing changes to be completed; exception, patient has scheduled wound care visit that day. **Please direct any NON-WOUND related issues/requests for orders to patient's Primary Care Physician. **If current dressing causes regression in wound condition, may D/C ordered dressing product/s and apply Normal Saline Moist Dressing daily until next [place] or Other MD appointment. **Notify [facility] of regression in wound condition at [phone]. Bathing/ Shower/ Hygiene Clean wound with Normal Saline or wound cleanser. - keep dressing dry or change after shower No tub bath. Off-Loading Gel wheelchair cushion Hospital bed/mattress - Has at home A fluidized (Group 3) - Has from [employer] ir Turn and reposition every 2 hours - Do not sit in wheelchair any longer than you have to--offload wound in bed as much as possible Additional Orders / Instructions Follow Nutritious Diet and Increase Protein Intake Other: - Refer to ostomy clinic in [facility] if desired Wound Treatment Wound #3 - Ischial Tuberosity Wound Laterality: Right Cleanser: Byram Ancillary Kit - 15 Day Supply (Generic) 3 x Per Week/30 Days Discharge Instructions: Use supplies as instructed; Kit contains: (15) Saline Bullets; (15) 3x3 Gauze; 15 pr Gloves Cleanser: Normal Saline 3 x Per Week/30 Days Discharge Instructions: Wash your hands with soap and water. Remove old  dressing, discard into plastic bag and place into trash. Cleanse the wound with Normal Saline prior to applying a clean dressing using gauze sponges, not tissues or cotton balls. Do not scrub or use excessive force. Pat dry using gauze sponges, not tissue or cotton balls. Cleanser: Wound Cleanser 3 x Per Week/30 Days Discharge Instructions: Wash your hands with soap and water. Remove old dressing, discard into plastic bag and place into trash. Cleanse the wound with Wound Cleanser prior to applying a clean dressing using gauze sponges, not tissues or  cotton balls. Do not scrub or use excessive force. Pat dry using gauze sponges, not tissue or cotton balls. Prim Dressing: Gauze (Generic) 3 x Per Week/30 Days ary Discharge Instructions: moistened with dakins Prim Dressing: Hydrofera Blue Ready Transfer Foam, 2.5x2.5 (in/in) (Generic) 3 x Per Week/30 Days ary Discharge Instructions: Apply Hydrofera Blue Ready to wound bed in a double layer to fill dead space Secondary Dressing: ABD Pad 5x9 (in/in) (Generic) 3 x Per Week/30 Days Discharge Instructions: Cover with ABD pad Secured With: Medipore T - 84M Medipore H Soft Cloth Surgical T ape ape, 2x2 (in/yd) (Generic) 3 x Per Week/30 Days Electronic Signature(s) Signed: 08/12/2022 3:56:39 PM By: Linton Ham MD Signed: 08/17/2022 7:52:55 AM By: Massie Kluver Entered By: Massie Kluver on 08/12/2022 15:10:36 [name] ([id number]) [id number] Page 5 of 9 -------------------------------------------------------------------------------- Problem List Details Patient Name: Date of ServiceMila [name] [facility], [name] 08/12/2022 2:15 PM Medical Record Number: [id number] Patient Account Number: [id number] Date of Birth/Sex: Treating RN: Oct 31, 1965 (56 y.o. [name] Primary Care Provider: Fulton Reek Other Clinician: Massie Kluver Referring Provider: Treating Provider/Extender: RO BSO N, MICHA EL Ginny Forth in Treatment: 29 Active Problems ICD-10 Encounter Code Description Active Date MDM Diagnosis L89.314 Pressure ulcer of right buttock, stage 4 01/15/2022 No Yes Q76.0 Spina bifida occulta 01/15/2022 No Yes Z93.3 Colostomy status 01/15/2022 No Yes I10 Essential (primary) hypertension 01/15/2022 No Yes I25.10 Atherosclerotic heart disease of native coronary artery without angina pectoris 01/15/2022 No Yes Inactive Problems ICD-10 Code Description Active Date Inactive Date L89.323 Pressure ulcer of left buttock, stage 3 02/05/2022 02/05/2022 Resolved Problems Electronic Signature(s) Signed: 08/12/2022 3:56:39 PM By: Linton Ham MD Entered By: Linton Ham on 08/12/2022 15:23:44 [name] ([id number]) [id number] Page 6 of 9 -------------------------------------------------------------------------------- Progress Note Details Patient Name: Date of ServiceMila [name] [facility], [name] 08/12/2022 2:15 PM Medical Record Number: [id number] Patient Account Number: [id number] Date of Birth/Sex: Treating RN: Nov 07, 1965 (56 y.o. [name] Primary Care Provider: Fulton Reek Other Clinician: Massie Kluver Referring Provider: Treating Provider/Extender: RO BSO N, MICHA EL Ginny Forth in Treatment: 29 Subjective History of Present Illness (HPI) 56 year old male with a history of spina bifida presenting to [place] with a history of a open wound on the left gluteal region near his upper thigh which she's had for several weeks. He was seen in the ED recently at [facility] and was put on doxycycline and was advised some local care. his past medical history is significant for spinal bifid, neurogenic bladder, kidney stones, sacral pressure sores, constipation. Past surgical history significant for partial cystectomy, ileal conduit, VP shunt removal, percutaneous nephro lithotripsy. In the remote past the patient says he's had some  treatment for a ulcer on this area and was treated with a skin graft. Readmission: 10/16/2021 upon evaluation today patient appears to be doing somewhat poorly in regard to a fairly large wound noted over the right ischial tuberosity location. This is a stage IV pressure ulcer. Of note this is also positive for osteomyelitis upon a CT scan which was performed during the time that he was admitted in the hospital on 09/19/2021. With that being said it is noted in the right inferior buttock that he has a decubitus ulcer which extends to the posterior toe inferior right ischium where there is evidence of osteomyelitis. When he was here in the office actually missed that this was positive I missed read it and thought it said that there was  no osteomyelitis. That is not the case there is in fact osteomyelitis noted here. I actually did review his notes as well in epic. Looking to the discharge summary it appears that the patient was admitted from 09/19/2021 through 09/23/2021. He was discharged home with home health care according to the note. With that being said from what I understand his sister actually takes care of him at this point. He was also to follow-up with a primary care provider and here at the wound care center within a week. I am just now seeing him on the 13th almost a month after discharge. He does have a history of again osteomyelitis of this right hip/ischial tuberosity location. 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 The reason for his admission was actually worsening of the decubitus ulcer upon admission. He does have chronic osteomyelitis of this area. He was treated with empiric antibiotics he had acute kidney injury which improved with IV fluids he also had hypokalemia. Surgical debridement was performed by general surgery at  that time and ID was consulted to assist with management according to the notes. IV antibiotics were recommended but patient declined and wanted oral antibiotics at discharge. Therefore no IV antibiotics were planned for discharge time. This case was under the care of Dr. Steva Ready while he was in the hospital when she did discharge him with a 2-week course of doxycycline and Augmentin according to the notes. It looks like Dr. Doy Hutching office did call and leave a message for the patient to get scheduled for a follow-up visit after hospital discharge I do not see where he showed up in fact it appears that the patient has a no-show letter from Dr. Doy Hutching office noted in epic due to a appointment that was missed on 10/06/2021. Looking at the pictures from Dr. Raelene Bott notes on 09/23/2021 it does appear that the wound is a little bit better as far as the cleanliness of the surface of the wound today but nonetheless still is a very significant wound. 10/26/2021 upon evaluation today patient appears to be doing okay in regard to his wound. Again this is a wound that he did indeed have osteomyelitis and previous. With that being said I do not see any signs of a worsening infection at this time which is good news. Overall I think that we are headed in the right direction although still I think he needs more aggressive offloading. 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 I do believe this would be ideal for him to be honest. Readmission: 01-15-2022 upon evaluation today patient presents for reevaluation he was last seen on October 26, 2021. At that time unfortunately he was having issues with a significant ischial pressure ulcer on the right ischial tuberosity. Subsequently he ended up having decent response to the Dakin's moistened gauze dressing that was previously being utilized and subsequently the patient's sister who is his primary caregiver as well states that she decided just  to try to manage this at home. Nonetheless unfortunately this is getting a little larger not better. He does spend a lot of the day in his chair. He does have an air mattress according what they tell me today. That was previously ordered. With that being said I do believe that at this time things do appear to be  doing much better from the standpoint of infection I do not see any signs of overt infection. There is also no evidence of systemic infection. No fevers, chills, nausea, vomiting, or diarrhea. With that being said I do believe based on what I am seeing the wound is a bit cleaner and he possibly could be an excellent candidate for a wound VAC. 02-05-2022 upon evaluation today patient appears to be doing about the same in regard to his wound. Unfortunately he is not any better but he also really has not had the wound VAC on sufficiently. There is been some confusion with the orders part of that I think is our follow-up with the way the order was written part of it may be with how the wound VAC is being placed either way we need to see what we can do to try to improve things in this regard. We will clarify the orders today going forward for home health. Unfortunately the patient does have a new wound today which is on the opposite side. This is not good 1 was bad enough have another area to open is definitely not a good thing. Its not as deep but nonetheless is also not very shallow either. We may potentially be able to get this to the point that we could bridge the 2 together in order to wound VAC both but right now want a focus on to try to get the wound VAC going for the main wound initially we will likely use Hydrofera Blue on the new wound.Marland Kitchen 02-19-2022 upon evaluation today patient's wound appears to be doing a little bit worse in the way of maceration fortunately there is no signs of active infection locally or systemically which is great news. No fevers, chills, nausea, vomiting, or  diarrhea. 03-05-2022 upon evaluation today patient unfortunately does not appear to be doing nearly as good in regard to his wounds as I would like to see. Fortunately I do not see any evidence of active infection that is obvious although I am can obtain a wound culture to ensure that there is not anything getting worse here as far as the wound without the wound VAC on his concern. With that being said I will go ahead and get this sent in and evaluated will initiate treatment as needed going forward if necessary. With regard to the wound with the wound VAC this continues to be placed directly over the wound and there is obvious signs of deep tissue injury. The suction being here means that he is sitting on it I think this may be part of the reason why it is coming off although not 161% certain. 03-12-2022 upon evaluation today patient still has not gotten the antibiotics that were called in for him 4 days ago. He tells me that his sister just told him this morning that they were ready but that she has not had a chance to go pick them up. Nonetheless I feel like that this is really something he needs any should have been taken that not the other antibiotics which were not doing the job for him to be honest. The patient states and I completely agree that there is really no way he would have known relying on his sister to let him know obviously. Nonetheless I do believe that he needs to not be taking any other antibiotics and be on the Levaquin as soon as possible. He tells me that she said she was going to pick them up today. In regard to  home health I am hopeful that they actually have been bridging this now. I do think that the wound looks better and this is good news. With that being said I am not certain that I can really tell for sure how this was attached it was removed before he came in and the black foam left in place. I really want him to leave the wound VAC in place until he comes in so that we can  monitor and make sure that this is being done correctly he voiced understanding. 03-19-2022 upon evaluation today and much happier with how things appear as far as the patient's wounds are concerned. I think he is on a much better track and they are doing a better job at bridging although its not quite where I like to see it is still more posterior which I think the patient needs to be a little bit more lateral on his side. Either way this is something that I did marked with a small dot today for him to tell the home health nurses well so she can bridge it to that location also think the tubing should go up instead of going down to just make it less cumbersome overall for him. [place], [name] ([id number]) [id number] Page 7 of 9 04-19-2022 upon evaluation today patient appears to be doing a little better in regard to his wounds. Fortunately I do not see any signs of infection I do believe he is on the right track. The wound VAC does seem to be helping to clean the wound up a bit and bring it in from the base at work. This is good news. 05-10-2022 upon evaluation today patient appears to be doing well currently in regard to his wounds in general in fact of the smaller of the 2 wounds without the wound VAC actually appears to be doing so much better this is almost completely closed and very pleased in that regard. He has been staying off of this he tells me and shows. With that being said the wound with the wound VAC is going require some sharp debridement there is some tissue which is not quite as healthy and we are going to go ahead and continue that for probably 2 more weeks although I think we may be closer to discontinuing this as well since it has healed and quite significantly. 05-24-2022 upon evaluation today patient appears to be doing well with regard to his wound in fact this looks better than I have been expected. This actually I think is a good time to go ahead and  switch away from the wound VAC based on what I am seeing. I think we go to [facility] with a bordered foam dressing. 06-21-2022 upon evaluation today patient appears to be doing well currently in regard to his wound this is measuring smaller and looks well. Fortunately there does not appear to be any signs of active infection locally or systemically at this time which is great news. No fevers, chills, nausea, vomiting, or diarrhea. 07-29-2022 upon evaluation today patient appears to be doing well currently in regard to his wound. This is actually measuring smaller although there is a lot of hypergranulation noted. Fortunately there does not appear to be any signs of active infection locally or systemically at this time. No fevers, chills, nausea, vomiting, or diarrhea. 11/9; pressure ulcer on the right buttock in the setting of spina bifida and lower extremity paralysis. He has been using Hydrofera Blue Objective Constitutional Sitting or standing Blood  Pressure is within target range for patient.. Pulse regular and within target range for patient.Marland Kitchen Respirations regular, non-labored and within target range.. Temperature is normal and within the target range for the patient.Marland Kitchen appears in no distress. Vitals Time Taken: 2:42 PM, Height: 60 in, Temperature: 97.4 F, Pulse: 93 bpm, Respiratory Rate: 18 breaths/min, Blood Pressure: 119/76 mmHg. General Notes: Wound exam; the right ischial tuberosity. The wound is hyper granulated but irregular. Some of it protruding out of the wound and some of it at skin level. I used pickups and scissors to remove some of the hyper granulated tissue and then a #5 curette to attempt to get to a level surface. This cleans up quite nicely. On the medial aspect of this there is a small probing area that is deeper but it does not go to bone. There is no evidence of infection Integumentary (Hair, Skin) Wound #3 status is Open. Original cause of wound was Gradually  Appeared. The date acquired was: 09/03/2021. The wound has been in treatment 29 weeks. The wound is located on the Right Ischial Tuberosity. The wound measures 2cm length x 4cm width x 1.1cm depth; 6.283cm^2 area and 6.912cm^3 volume. There is Fat Layer (Subcutaneous Tissue) exposed. There is no tunneling or undermining noted. There is a large amount of serosanguineous drainage noted. There is large (67-100%) red, pink granulation within the wound bed. There is a small (1-33%) amount of necrotic tissue within the wound bed including Eschar and Adherent Slough. Assessment Active Problems ICD-10 Pressure ulcer of right buttock, stage 4 Spina bifida occulta Colostomy status Essential (primary) hypertension Atherosclerotic heart disease of native coronary artery without angina pectoris Procedures Wound #3 Pre-procedure diagnosis of Wound #3 is a Pressure Ulcer located on the Right Ischial Tuberosity . There was a Excisional Skin/Subcutaneous Tissue Debridement with a total area of 8 sq cm performed by Ricard Dillon, MD. With the following instrument(s): Curette, Forceps, and Scissors to remove Viable and Non-Viable tissue/material. Material removed includes Subcutaneous Tissue, Slough, Skin: Dermis, and Skin: Epidermis. A time out was conducted at 15:03, prior to the start of the procedure. A Moderate amount of bleeding was controlled with Silver Nitrate. The procedure was tolerated well. Post Debridement Measurements: 2cm length x 4cm width x 1.3cm depth; 8.168cm^3 volume. Post debridement Stage noted as Category/Stage IV. Character of Wound/Ulcer Post Debridement is stable. Post procedure Diagnosis Wound #3: Same as Pre-Procedure Plan AIKEEM, LILLEY ([id number]) [id number] Page 8 of 9 Follow-up Appointments: Return Appointment in 2 weeks. Nurse Visit as needed Home Health: [place]: - [facility] ADMIT to [place] for wound care. May utilize  formulary equivalent dressing for wound treatment orders unless otherwise specified. Home Health Nurse may visit PRN to address patientoos wound care needs. Scheduled days for dressing changes to be completed; exception, patient has scheduled wound care visit that day. **Please direct any NON-WOUND related issues/requests for orders to patient's Primary Care Physician. **If current dressing causes regression in wound condition, may D/C ordered dressing product/s and apply Normal Saline Moist Dressing daily until next [place] or Other MD appointment. **Notify [facility] of regression in wound condition at [phone]. Bathing/ Shower/ Hygiene: Clean wound with Normal Saline or wound cleanser. - keep dressing dry or change after shower No tub bath. Off-Loading: Gel wheelchair cushion Hospital bed/mattress - Has at home Air fluidized (Group 3) - Has from [employer] and reposition every 2 hours - Do not sit in wheelchair any longer than you have to--offload wound  in bed as much as possible Additional Orders / Instructions: Follow Nutritious Diet and Increase Protein Intake Other: - Refer to ostomy clinic in [place] if desired WOUND #3: - Ischial Tuberosity Wound Laterality: Right Cleanser: Byram Ancillary Kit - 15 Day Supply (Generic) 3 x Per Week/30 Days Discharge Instructions: Use supplies as instructed; Kit contains: (15) Saline Bullets; (15) 3x3 Gauze; 15 pr Gloves Cleanser: Normal Saline 3 x Per Week/30 Days Discharge Instructions: Wash your hands with soap and water. Remove old dressing, discard into plastic bag and place into trash. Cleanse the wound with Normal Saline prior to applying a clean dressing using gauze sponges, not tissues or cotton balls. Do not scrub or use excessive force. Pat dry using gauze sponges, not tissue or cotton balls. Cleanser: Wound Cleanser 3 x Per Week/30 Days Discharge Instructions: Wash your hands with soap and water. Remove old  dressing, discard into plastic bag and place into trash. Cleanse the wound with Wound Cleanser prior to applying a clean dressing using gauze sponges, not tissues or cotton balls. Do not scrub or use excessive force. Pat dry using gauze sponges, not tissue or cotton balls. Prim Dressing: Gauze (Generic) 3 x Per Week/30 Days ary Discharge Instructions: moistened with dakins Prim Dressing: Hydrofera Blue Ready Transfer Foam, 2.5x2.5 (in/in) (Generic) 3 x Per Week/30 Days ary Discharge Instructions: Apply Hydrofera Blue Ready to wound bed in a double layer to fill dead space Secondary Dressing: ABD Pad 5x9 (in/in) (Generic) 3 x Per Week/30 Days Discharge Instructions: Cover with ABD pad Secured With: Medipore T - 78M Medipore H Soft Cloth Surgical T ape ape, 2x2 (in/yd) (Generic) 3 x Per Week/30 Days 1. I think appropriate dressing is still Hydrofera Blue 2. We talked about offloading this of course is a difficult thing for this patient to achieve. 3. Fortunately no evidence of infection no cultures were done Electronic Signature(s) Signed: 08/12/2022 3:56:39 PM By: Linton Ham MD Entered By: Linton Ham on 08/12/2022 15:27:54 -------------------------------------------------------------------------------- SuperBill Details Patient Name: Date of Service: [name] TH, [name] 08/12/2022 Medical Record Number: [id number] Patient Account Number: [id number] Date of Birth/Sex: Treating RN: 1966/09/18 (56 y.o. [name] Primary Care Provider: Fulton Reek Other Clinician: Massie Kluver Referring Provider: Treating Provider/Extender: RO BSO N, MICHA EL Ginny Forth in Treatment: 29 Diagnosis Coding ICD-10 Codes Code Description L89.314 Pressure ulcer of right buttock, stage 4 [place], [place] ([id number]) [id number] Page 9 of 9 Q76.0 Spina bifida occulta Z93.3 Colostomy status I10 Essential (primary) hypertension I25.10  Atherosclerotic heart disease of native coronary artery without angina pectoris Facility Procedures : CPT4 Code: [id number] Description: 81275 - DEB SUBQ TISSUE 20 SQ CM/< ICD-10 Diagnosis Description L89.314 Pressure ulcer of right buttock, stage 4 Modifier: Quantity: 1 Physician Procedures : CPT4 Code Description Modifier [id number] - WC PHYS SUBQ TISS 20 SQ CM ICD-10 Diagnosis Description L89.314 Pressure ulcer of right buttock, stage 4 Quantity: 1 Electronic Signature(s) Signed: 08/12/2022 3:56:39 PM By: Linton Ham MD Entered By: Linton Ham on 08/12/2022 15:28:17

## 2022-09-02 ENCOUNTER — Encounter: Payer: Medicare Other | Admitting: Physician Assistant

## 2022-09-02 DIAGNOSIS — L89314 Pressure ulcer of right buttock, stage 4: Secondary | ICD-10-CM | POA: Diagnosis not present

## 2022-09-02 NOTE — Progress Notes (Addendum)
Merom, [name] ([id number]) [id number] Page 1 of 8 Visit Report for 09/02/2022 Arrival Information Details Patient Name: Date of Service: [name] [facility], [place] [name] 09/02/2022 10:30 A M Medical Record Number: [id number] Patient Account Number: [id number] Date of Birth/Sex: Treating RN: 06/13/1966 (56 y.o. [name] Primary Care [name]: [name] Other Clinician: Massie [name] Referring [name]: Treating [name]/Extender: [name] in Treatment: 35 Visit Information History Since Last Visit All ordered tests and consults were completed: No Patient Arrived: Wheel Chair Added or deleted any medications: No Arrival Time: 11:06 Any new allergies or adverse reactions: No Transfer Assistance: None Had a fall or experienced change in No Patient Identification Verified: Yes activities of daily living that may affect Secondary Verification Process Completed: Yes risk of falls: Patient Requires Transmission-Based Precautions: No Signs or symptoms of abuse/neglect since last visito No Patient Has Alerts: Yes Hospitalized since last visit: No Patient Alerts: NOT diabetic Implantable device outside of the clinic excluding No cellular tissue based products placed in the center since last visit: Has Dressing in Place as Prescribed: Yes Pain Present Now: No Electronic Signature(s) Signed: 09/02/2022 4:33:44 PM By: [name] Entered By: [name] on 09/02/2022 11:10:50 -------------------------------------------------------------------------------- Clinic Level of Care Assessment Details Patient Name: Date of ServiceMila [name], [name] 09/02/2022 10:30 A M Medical Record Number: [id number] Patient Account Number: [id number] Date of Birth/Sex: Treating RN: 12-Jan-1966 (56 y.o. [name] Primary Care Tattianna Schnarr: [name] Other Clinician: Massie [name] Referring [name]: Treating [name]/Extender: [name] in Treatment: 38 Clinic Level of Care Assessment Items TOOL 1 Quantity Score _0  - 0 Use when EandM and Procedure is performed on INITIAL visit ASSESSMENTS - Nursing Assessment / Reassessment _1  - 0 General Physical Exam (combine w/ comprehensive assessment (listed just below) when performed on new pt. evals) _2  - 0 Comprehensive Assessment (HX, ROS, Risk Assessments, Wounds Hx, etc.) [name] ([id number]) [id number] Page 2 of 8 ASSESSMENTS - Wound and Skin Assessment / Reassessment _3  - 0 Dermatologic / Skin Assessment (not related to wound area) ASSESSMENTS - Ostomy and/or Continence Assessment and Care _4  - 0 Incontinence Assessment and Management _5  - 0 Ostomy Care Assessment and Management (repouching, etc.) PROCESS - Coordination of Care _6  - 0 Simple Patient / Family Education for ongoing care _7  - 0 Complex (extensive) Patient / Family Education for ongoing care _8  - 0 Staff obtains Programmer, systems, Records, T Results / Process Orders est _9  - 0 Staff telephones HHA, Nursing Homes / Clarify orders / etc _10  - 0 Routine Transfer to another Facility (non-emergent condition) _11  - 0 Routine [name] Admission (non-emergent condition) _12  - 0 New Admissions / Biomedical engineer / Ordering NPWT Apligraf, etc. , _13  - 0 Emergency [name] Admission (emergent condition) PROCESS - Special Needs _14  - 0 Pediatric / Minor Patient Management _15  - 0 Isolation Patient Management _16  - 0 Hearing / Language / Visual special needs _17  - 0 Assessment of Community assistance (transportation, D/C planning, etc.) _18  - 0 Additional assistance / Altered mentation _19  - 0 Support Surface(s) Assessment (bed, cushion, seat, etc.) INTERVENTIONS - Miscellaneous _20  - 0 External ear exam _21  - 0 Patient Transfer (multiple staff / Civil Service fast streamer / Similar devices) _22  - 0 Simple Staple / Suture removal (25 or less) _23  - 0 Complex  Staple / Suture removal (26 or more) _24  - 0 Hypo/Hyperglycemic Management (Chase not check if billed separately) _25  - 0 Ankle / Brachial Index (ABI) - Chase  not check if billed separately Has the patient been seen at the [name] within the last three years: Yes Total Score: 0 Level Of Care: ____ Electronic Signature(s) Signed: 09/02/2022 4:33:44 PM By: [name] Entered By: [name] on 09/02/2022 11:39:11 -------------------------------------------------------------------------------- Encounter Discharge Information Details Patient Name: Date of Service: [name] [place], [name] 09/02/2022 10:30 A M Medical Record Number: [id number] Patient Account Number: [id number] Date of Birth/Sex: Treating RN: 1966-09-25 (56 y.o. [name] Primary Care Devi Hopman: [name] Other Clinician: Massie [name] Referring [name]: Treating Destine Ambroise/Extender: [name] [place], [place] ([id number]) [id number] Page 3 of 8 Weeks in Treatment: 32 Encounter Discharge Information Items Discharge Condition: Stable Ambulatory Status: Wheelchair Discharge Destination: Home Transportation: Other Accompanied By: self Schedule Follow-up Appointment: Yes Clinical Summary of Care: Electronic Signature(s) Signed: 09/02/2022 4:33:44 PM By: [name] Entered By: [name] on 09/02/2022 11:40:20 -------------------------------------------------------------------------------- Lower Extremity Assessment Details Patient Name: Date of ServiceMila [name], [name] 09/02/2022 10:30 A M Medical Record Number: [id number] Patient Account Number: [id number] Date of Birth/Sex: Treating RN: [birth year] (56 y.o. [name] Primary Care Giovoni Bunch: [name] Other Clinician: Massie [name] Referring Rasheema Truluck: Treating Jozeph Persing/Extender: [name] Weeks in Treatment: 32 Electronic Signature(s) Signed: 09/02/2022 4:33:44 PM By:  [name] Signed: 09/03/2022 2:41:51 PM By: Gretta Cool, BSN, RN, CWS, Kim RN, BSN Entered By: [name] on 09/02/2022 11:27:01 -------------------------------------------------------------------------------- Multi Wound Chart Details Patient Name: Date of Service: [name] [facility], [name] 09/02/2022 10:30 A M Medical Record Number: [id number] Patient Account Number: [id number] Date of Birth/Sex: Treating RN: 24-Jun-1966 (56 y.o. [name] Primary Care Adonica Fukushima: [name] Other Clinician: Massie [name] Referring Orlinda Slomski: Treating Sharen Youngren/Extender: [name] Weeks in Treatment: 32 Vital Signs Height(in): 60 Pulse(bpm): 97 Weight(lbs): Blood Pressure(mmHg): 103/66 Body Mass Index(BMI): Temperature(F): 98.0 Respiratory Rate(breaths/min): 16 [Yakubov, Ryley ([id number]):Photos:] [id number].pdf Page 4 of 8:3 N/A N/A N/A N/A] Right Ischial Tuberosity N/A N/A Wound Location: Gradually Appeared N/A N/A Wounding Event: Pressure Ulcer N/A N/A Primary Etiology: Cataracts, Glaucoma, Coronary Artery N/A N/A Comorbid History: Disease, Hypertension, History of pressure wounds, Osteoarthritis, Paraplegia 09/03/2021 N/A N/A Date Acquired: 105 N/A N/A Weeks of Treatment: Open N/A N/A Wound Status: No N/A N/A Wound Recurrence: 1.9x3.7x1 N/A N/A Measurements L x W x D (cm) 5.521 N/A N/A A (cm) : rea 5.521 N/A N/A Volume (cm) : 68.60% N/A N/A % Reduction in A rea: 87.40% N/A N/A % Reduction in Volume: Category/Stage IV N/A N/A Classification: Large N/A N/A Exudate A mount: Serosanguineous N/A N/A Exudate Type: red, brown N/A N/A Exudate Color: Large (67-100%) N/A N/A Granulation A mount: Red, Pink N/A N/A Granulation Quality: Small (1-33%) N/A N/A Necrotic A mount: Eschar, Adherent Slough N/A N/A Necrotic Tissue: Fat Layer (Subcutaneous Tissue): Yes N/A N/A Exposed Structures: None N/A  N/A Epithelialization: Treatment Notes Electronic Signature(s) Signed: 09/02/2022 4:33:44 PM By: [name] Entered By: [name] on 09/02/2022 11:35:37 -------------------------------------------------------------------------------- Multi-Disciplinary Care Plan Details Patient Name: Date of Service: [name] TH, [name] 09/02/2022 10:30 A M Medical Record Number: [id number] Patient Account Number: [id number] Date of Birth/Sex: Treating RN: 02/13/1966 (56 y.o. [name] Primary Care Abelina Ketron: [name] Other Clinician: Massie [name] Referring Kingstin Heims: Treating Aryaan Persichetti/Extender: [name] in Treatment: 32 Active Inactive Pressure Nursing Diagnoses: Knowledge deficit related to causes and risk factors for pressure ulcer development Knowledge deficit related to management of pressures ulcers Potential for impaired tissue integrity related to pressure, friction,  moisture, and shear [place], [name] ([id number]) [id number] Page 5 of 8 Goals: Patient will remain free from development of additional pressure ulcers Date Initiated: 01/15/2022 Target Resolution Date: 02/26/2022 Goal Status: Active Patient/caregiver will verbalize risk factors for pressure ulcer development Date Initiated: 01/15/2022 Target Resolution Date: 03/05/2022 Goal Status: Active Interventions: Assess: immobility, friction, shearing, incontinence upon admission and as needed Assess offloading mechanisms upon admission and as needed Assess potential for pressure ulcer upon admission and as needed Notes: Electronic Signature(s) Signed: 09/02/2022 4:33:44 PM By: [name] Signed: 09/03/2022 2:41:51 PM By: Gretta Cool, BSN, RN, CWS, Kim RN, BSN Entered By: [name] on 09/02/2022 11:27:06 -------------------------------------------------------------------------------- Pain Assessment Details Patient Name: Date of Service: [name] TH, Chase NA  [name] 09/02/2022 10:30 A M Medical Record Number: [id number] Patient Account Number: [id number] Date of Birth/Sex: Treating RN: 1965-10-11 (56 y.o. [name] Primary Care Alannah Averhart: [name] Other Clinician: Massie [name] Referring Shakeia Krus: Treating Taira Knabe/Extender: [name] Weeks in Treatment: 32 Active Problems Location of Pain Severity and Description of Pain Patient Has Paino No Site Locations Pain Management and Medication Current Pain Management: Electronic Signature(s) Signed: 09/02/2022 4:33:44 PM By: [name] Signed: 09/03/2022 2:41:51 PM By: Gretta Cool, BSN, RN, CWS, Kim RN, BSN Entered By: [name] on 09/02/2022 11:23:42 [name] ([id number]) [id number] Page 6 of 8 -------------------------------------------------------------------------------- Patient/Caregiver Education Details Patient Name: Date of Service: [name] [place] [name] [place], [name] 11/30/2023andnbsp10:30 A M Medical Record Number: [id number] Patient Account Number: [id number] Date of Birth/Gender: Treating RN: Oct 14, 1965 (56 y.o. [name] Primary Care Physician: [name] Other Clinician: Massie [name] Referring Physician: Treating Physician/Extender: [name] in Treatment: 38 Education Assessment Education Provided To: Patient Education Topics Provided Pressure: Handouts: Other: avoid sliding on bottom as much as you can Methods: Explain/Verbal Responses: State content correctly Wound/Skin Impairment: Handouts: Other: continue wound care as directed Methods: Explain/Verbal Responses: State content correctly Electronic Signature(s) Signed: 09/02/2022 4:33:44 PM By: [name] Entered By: [name] on 09/02/2022 11:39:53 -------------------------------------------------------------------------------- Wound Assessment Details Patient Name: Date of ServiceMila [name] [facility], [name] 09/02/2022  10:30 A M Medical Record Number: [id number] Patient Account Number: [id number] Date of Birth/Sex: Treating RN: [birth year] (56 y.o. [name] Primary Care Cheryal Salas: [name] Other Clinician: Massie [name] Referring Zekiel Torian: Treating Leevi Cullars/Extender: [name] Weeks in Treatment: 32 Wound Status Wound Number: 3 Primary Pressure Ulcer Etiology: Wound Location: Right Ischial Tuberosity Wound Open Wounding Event: Gradually Appeared Status: Date Acquired: 09/03/2021 Comorbid Cataracts, Glaucoma, Coronary Artery Disease, Hypertension, Weeks Of Treatment: 32 History: History of pressure wounds, Osteoarthritis, Paraplegia [name] ([id number]) [id number] Page 7 of 8 History: History of pressure wounds, Osteoarthritis, Paraplegia Clustered Wound: No Photos Wound Measurements Length: (cm) 1.9 Width: (cm) 3.7 Depth: (cm) 1 Area: (cm) 5.521 Volume: (cm) 5.521 % Reduction in Area: 68.6% % Reduction in Volume: 87.4% Epithelialization: None Tunneling: No Undermining: No Wound Description Classification: Category/Stage IV Exudate Amount: Large Exudate Type: Serosanguineous Exudate Color: red, brown Foul Odor After Cleansing: No Slough/Fibrino Yes Wound Bed Granulation Amount: Large (67-100%) Exposed Structure Granulation Quality: Red, Pink Fat Layer (Subcutaneous Tissue) Exposed: Yes Necrotic Amount: Small (1-33%) Necrotic Quality: Eschar, Adherent Slough Treatment Notes Wound #3 (Ischial Tuberosity) Wound Laterality: Right Cleanser [place] Ancillary Kit - 15 Day Supply Discharge Instruction: Use supplies as instructed; Kit contains: (15) Saline Bullets; (15) 3x3 Gauze; 15 pr Gloves Normal Saline Discharge Instruction: Wash your hands with soap and water. Remove old dressing, discard into plastic bag and  place into trash. Cleanse the wound with Normal Saline prior to applying a clean dressing using gauze sponges,  not tissues or cotton balls. Chase not scrub or use excessive force. Pat dry using gauze sponges, not tissue or cotton balls. Wound Cleanser Discharge Instruction: Wash your hands with soap and water. Remove old dressing, discard into plastic bag and place into trash. Cleanse the wound with Wound Cleanser prior to applying a clean dressing using gauze sponges, not tissues or cotton balls. Chase not scrub or use excessive force. Pat dry using gauze sponges, not tissue or cotton balls. Peri-Wound Care Topical Primary Dressing Gauze Discharge Instruction: moistened with dakins Hydrofera Blue Ready Transfer Foam, 2.5x2.5 (in/in) Discharge Instruction: Apply Hydrofera Blue Ready to wound bed in a double layer to fill dead space Secondary Dressing ABD Pad 5x9 (in/in) Discharge Instruction: Cover with ABD pad Secured With Medipore T - 51M Medipore H Soft Cloth Surgical T ape ape, 2x2 (in/yd) Compression Wrap Compression Stockings Add-Ons [place], [name] ([id number]) [id number] Page 8 of 8 Electronic Signature(s) Signed: 09/02/2022 4:33:44 PM By: [name] Signed: 09/03/2022 2:41:51 PM By: Gretta Cool, BSN, RN, CWS, Kim RN, BSN Entered By: [name] on 09/02/2022 11:29:48 -------------------------------------------------------------------------------- Vitals Details Patient Name: Date of Service: [name] TH, [name] 09/02/2022 10:30 A M Medical Record Number: [id number] Patient Account Number: [id number] Date of Birth/Sex: Treating RN: August 20, 1966 (56 y.o. [name], Maudie Mercury Primary Care Frederick Marro: [name] Other Clinician: Massie [name] Referring Keesha Pellum: Treating Caton Popowski/Extender: [name] Weeks in Treatment: 32 Vital Signs Time Taken: 11:11 Temperature (F): 98.0 Height (in): 60 Pulse (bpm): 97 Respiratory Rate (breaths/min): 16 Blood Pressure (mmHg): 103/66 Reference Range: 80 - 120 mg / dl Electronic Signature(s) Signed:  09/02/2022 4:33:44 PM By: [name] Entered By: [name] on 09/02/2022 11:23:38

## 2022-09-02 NOTE — Progress Notes (Addendum)
[place], [name] ([id number]) [id number] Page 1 of 9 Visit Report for 09/02/2022 Chief Complaint Document Details Patient Name: Date of Service: [name] [facility], [name] 09/02/2022 10:30 A M Medical Record Number: [id number] Patient Account Number: [id number] Date of Birth/Sex: Treating RN: November 05, 1965 (56 y.o. [name] Primary Care Provider: Fulton Reek Other Clinician: Massie Kluver Referring Provider: Treating Provider/Extender: [name] Weeks in Treatment: 33 Information Obtained from: Patient Chief Complaint Right ischial tuberosity pressure ulcer Electronic Signature(s) Signed: 09/02/2022 10:44:02 AM By: Worthy Keeler PA-C Entered By: Worthy Keeler on 09/02/2022 10:44:01 -------------------------------------------------------------------------------- HPI Details Patient Name: Date of Service: [name] [facility], [name] 09/02/2022 10:30 A M Medical Record Number: [id number] Patient Account Number: [id number] Date of Birth/Sex: Treating RN: 11-26-1965 (55 y.o. [name] Primary Care Provider: Fulton Reek Other Clinician: Massie Kluver Referring Provider: Treating Provider/Extender: Rupert Stacks in Treatment: 66 History of Present Illness HPI Description: 56 year old male with a history of spina bifida presenting to [place] with a history of a open wound on the left gluteal region near his upper thigh which she's had for several weeks. He was seen in the ED recently at [facility] and was put on doxycycline and was advised some local care. his past medical history is significant for spinal bifid, neurogenic bladder, kidney stones, sacral pressure sores, constipation. Past surgical history significant for partial cystectomy, ileal conduit, VP shunt removal, percutaneous nephro lithotripsy. In the remote past the patient says he's had some treatment for a ulcer on this area and was treated  with a skin graft. Readmission: 10/16/2021 upon evaluation today patient appears to be doing somewhat poorly in regard to a fairly large wound noted over the right ischial tuberosity location. This is a stage IV pressure ulcer. Of note this is also positive for osteomyelitis upon a CT scan which was performed during the time that he was admitted in the hospital on 09/19/2021. With that being said it is noted in the right inferior buttock that he has a decubitus ulcer which extends to the posterior toe inferior right ischium where there is evidence of osteomyelitis. When he was here in the office actually missed that this was positive I missed read it and thought it said that there was no osteomyelitis. That is not the case there is in fact osteomyelitis noted here. I actually did review his notes as well in epic. Looking to the discharge summary it appears that the patient was admitted from 09/19/2021 through 09/23/2021. He was discharged home with home health care according to the note. With that being said from what I understand his sister actually takes care of him at this point. He was also to follow-up with a primary care provider and here at the wound care center within a week. I am just now seeing him on the 13th almost a BORIS, ENGELMANN ([id number]) [id number] Page 2 of 9 month after discharge. He does have a history of again osteomyelitis of this right hip/ischial tuberosity location. 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 The reason for his admission was actually worsening of the decubitus ulcer upon admission. He does have chronic osteomyelitis of this area. He was treated with empiric antibiotics he had acute kidney injury which improved with IV fluids he also had hypokalemia. Surgical debridement  was performed  by general surgery at that time and ID was consulted to assist with management according to the notes. IV antibiotics were recommended but patient declined and wanted oral antibiotics at discharge. Therefore no IV antibiotics were planned for discharge time. This case was under the care of Dr. Steva Ready while he was in the hospital when she did discharge him with a 2-week course of doxycycline and Augmentin according to the notes. It looks like Dr. Doy Hutching office did call and leave a message for the patient to get scheduled for a follow-up visit after hospital discharge I do not see where he showed up in fact it appears that the patient has a no-show letter from Dr. Doy Hutching office noted in epic due to a appointment that was missed on 10/06/2021. Looking at the pictures from Dr. Raelene Bott notes on 09/23/2021 it does appear that the wound is a little bit better as far as the cleanliness of the surface of the wound today but nonetheless still is a very significant wound. 10/26/2021 upon evaluation today patient appears to be doing okay in regard to his wound. Again this is a wound that he did indeed have osteomyelitis and previous. With that being said I do not see any signs of a worsening infection at this time which is good news. Overall I think that we are headed in the right direction although still I think he needs more aggressive offloading. 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 I do believe this would be ideal for him to be honest. Readmission: 01-15-2022 upon evaluation today patient presents for reevaluation he was last seen on October 26, 2021. At that time unfortunately he was having issues with a significant ischial pressure ulcer on the right ischial tuberosity. Subsequently he ended up having decent response to the Dakin's moistened gauze dressing that was previously being utilized and subsequently the patient's sister who is his primary caregiver as well states  that she decided just to try to manage this at home. Nonetheless unfortunately this is getting a little larger not better. He does spend a lot of the day in his chair. He does have an air mattress according what they tell me today. That was previously ordered. With that being said I do believe that at this time things do appear to be doing much better from the standpoint of infection I do not see any signs of overt infection. There is also no evidence of systemic infection. No fevers, chills, nausea, vomiting, or diarrhea. With that being said I do believe based on what I am seeing the wound is a bit cleaner and he possibly could be an excellent candidate for a wound VAC. 02-05-2022 upon evaluation today patient appears to be doing about the same in regard to his wound. Unfortunately he is not any better but he also really has not had the wound VAC on sufficiently. There is been some confusion with the orders part of that I think is our follow-up with the way the order was written part of it may be with how the wound VAC is being placed either way we need to see what we can do to try to improve things in this regard. We will clarify the orders today going forward for home health. Unfortunately the patient does have a new wound today which is on the opposite side. This is not good 1 was bad enough have another area to open is definitely not a good thing. Its  not as deep but nonetheless is also not very shallow either. We may potentially be able to get this to the point that we could bridge the 2 together in order to wound VAC both but right now want a focus on to try to get the wound VAC going for the main wound initially we will likely use Hydrofera Blue on the new wound.Marland Kitchen 02-19-2022 upon evaluation today patient's wound appears to be doing a little bit worse in the way of maceration fortunately there is no signs of active infection locally or systemically which is great news. No fevers, chills, nausea,  vomiting, or diarrhea. 03-05-2022 upon evaluation today patient unfortunately does not appear to be doing nearly as good in regard to his wounds as I would like to see. Fortunately I do not see any evidence of active infection that is obvious although I am can obtain a wound culture to ensure that there is not anything getting worse here as far as the wound without the wound VAC on his concern. With that being said I will go ahead and get this sent in and evaluated will initiate treatment as needed going forward if necessary. With regard to the wound with the wound VAC this continues to be placed directly over the wound and there is obvious signs of deep tissue injury. The suction being here means that he is sitting on it I think this may be part of the reason why it is coming off although not 381% certain. 03-12-2022 upon evaluation today patient still has not gotten the antibiotics that were called in for him 4 days ago. He tells me that his sister just told him this morning that they were ready but that she has not had a chance to go pick them up. Nonetheless I feel like that this is really something he needs any should have been taken that not the other antibiotics which were not doing the job for him to be honest. The patient states and I completely agree that there is really no way he would have known relying on his sister to let him know obviously. Nonetheless I do believe that he needs to not be taking any other antibiotics and be on the Levaquin as soon as possible. He tells me that she said she was going to pick them up today. In regard to home health I am hopeful that they actually have been bridging this now. I do think that the wound looks better and this is good news. With that being said I am not certain that I can really tell for sure how this was attached it was removed before he came in and the black foam left in place. I really want him to leave the wound VAC in place until he comes in so  that we can monitor and make sure that this is being done correctly he voiced understanding. 03-19-2022 upon evaluation today and much happier with how things appear as far as the patient's wounds are concerned. I think he is on a much better track and they are doing a better job at bridging although its not quite where I like to see it is still more posterior which I think the patient needs to be a little bit more lateral on his side. Either way this is something that I did marked with a small dot today for him to tell the home health nurses well so she can bridge it to that location also think the tubing should go up instead  of going down to just make it less cumbersome overall for him. 04-19-2022 upon evaluation today patient appears to be doing a little better in regard to his wounds. Fortunately I do not see any signs of infection I do believe he is on the right track. The wound VAC does seem to be helping to clean the wound up a bit and bring it in from the base at work. This is good news. 05-10-2022 upon evaluation today patient appears to be doing well currently in regard to his wounds in general in fact of the smaller of the 2 wounds without the wound VAC actually appears to be doing so much better this is almost completely closed and very pleased in that regard. He has been staying off of this he tells me and shows. With that being said the wound with the wound VAC is going require some sharp debridement there is some tissue which is not quite as healthy and we are going to go ahead and continue that for probably 2 more weeks although I think we may be closer to discontinuing this as well since it has healed and quite significantly. 05-24-2022 upon evaluation today patient appears to be doing well with regard to his wound in fact this looks better than I have been expected. This actually I think is a good time to go ahead and switch away from the wound VAC based on what I am seeing. I think we go  to [facility] with a bordered foam dressing. 06-21-2022 upon evaluation today patient appears to be doing well currently in regard to his wound this is measuring smaller and looks well. Fortunately there does not appear to be any signs of active infection locally or systemically at this time which is great news. No fevers, chills, nausea, vomiting, or diarrhea. 07-29-2022 upon evaluation today patient appears to be doing well currently in regard to his wound. This is actually measuring smaller although there is a lot of hypergranulation noted. Fortunately there does not appear to be any signs of active infection locally or systemically at this time. No fevers, chills, nausea, vomiting, or diarrhea. 11/9; pressure ulcer on the right buttock in the setting of spina bifida and lower extremity paralysis. He has been using [facility] 09-02-2022 upon evaluation today patient appears to be doing well currently in regard to his wound. We have been using Hydrofera Blue this is showing signs of improvement as far as getting smaller is concerned. There is some drainage around the edges of the wound but I think a lot of this is due to the fact he did not even have a dressing on that he came in today. Electronic Signature(s) Signed: 09/02/2022 11:46:56 AM By: Worthy Keeler PA-C Entered By: Worthy Keeler on 09/02/2022 11:46:55 [name] ([id number]) [id number] Page 3 of 9 -------------------------------------------------------------------------------- Otelia Sergeant TISS Details Patient Name: Date of ServiceMila [name] [facility], [name] 09/02/2022 10:30 A M Medical Record Number: [id number] Patient Account Number: [id number] Date of Birth/Sex: Treating RN: October 19, 1965 (56 y.o. [name] Primary Care Provider: Fulton Reek Other Clinician: Massie Kluver Referring Provider: Treating Provider/Extender: Rupert Stacks in Treatment:  32 Procedure Performed for: Wound #3 Right Ischial Tuberosity Performed By: Physician Tommie Sams., PA-C Post Procedure Diagnosis Same as Pre-procedure Notes one silver nitrate stick used to right ischial tuberosity Electronic Signature(s) Signed: 09/02/2022 4:33:44 PM By: Massie Kluver Entered By: Massie Kluver on 09/02/2022 11:37:14 -------------------------------------------------------------------------------- Physical Exam Details Patient Name:  Date of ServiceMila [name] [facility], [name] 09/02/2022 10:30 A M Medical Record Number: [id number] Patient Account Number: [id number] Date of Birth/Sex: Treating RN: Jun 01, 1966 (56 y.o. [name] Primary Care Provider: Fulton Reek Other Clinician: Massie Kluver Referring Provider: Treating Provider/Extender: [name] Weeks in Treatment: 49 Constitutional Well-nourished and well-hydrated in no acute distress. Respiratory normal breathing without difficulty. Psychiatric this patient is able to make decisions and demonstrates good insight into disease process. Alert and Oriented x 3. pleasant and cooperative. Notes Upon inspection patient's wound bed actually showed signs of good granulation epithelization at this point. Fortunately I do not see any evidence of infection locally nor systemically at this time which is great news and overall I am extremely pleased with where things stand today. Electronic Signature(s) [place], [place] ([id number]) [id number] Page 4 of 9 Signed: 09/02/2022 11:47:43 AM By: Worthy Keeler PA-C Entered By: Worthy Keeler on 09/02/2022 11:47:43 -------------------------------------------------------------------------------- Physician Orders Details Patient Name: Date of Service: [facility], [name] 09/02/2022 10:30 A M Medical Record Number: [id number] Patient Account Number: [id number] Date of Birth/Sex: Treating RN: [birth year] (56 y.o. [name] Primary Care Provider: Fulton Reek Other Clinician: Massie Kluver Referring Provider: Treating Provider/Extender: Rupert Stacks in Treatment: 2 Verbal / Phone Orders: No Diagnosis Coding ICD-10 Coding Code Description L89.314 Pressure ulcer of right buttock, stage 4 Q76.0 Spina bifida occulta Z93.3 Colostomy status I10 Essential (primary) hypertension I25.10 Atherosclerotic heart disease of native coronary artery without angina pectoris Follow-up Appointments Return Appointment in 2 weeks. Nurse Visit as needed [place]: - [facility] ADMIT to [place] for wound care. May utilize formulary equivalent dressing for wound treatment orders unless otherwise specified. Home Health Nurse may visit PRN to address patients wound care needs. Scheduled days for dressing changes to be completed; exception, patient has scheduled wound care visit that day. **Please direct any NON-WOUND related issues/requests for orders to patient's Primary Care Physician. **If current dressing causes regression in wound condition, may D/C ordered dressing product/s and apply Normal Saline Moist Dressing daily until next Clar[place] or Other MD appointment. **Notify [facility] of regression in wound condition at [phone]. Bathing/ Shower/ Hygiene Clean wound with Normal Saline or wound cleanser. - keep dressing dry or change after shower No tub bath. Off-Loading Gel wheelchair cushion Hospital bed/mattress - Has at home A fluidized (Group 3) - Has from [employer] ir Turn and reposition every 2 hours - Do not sit in wheelchair any longer than you have to--offload wound in bed as much as possible Other: - avoid sliding which causes friction as much as you can Additional Orders / Instructions Follow Nutritious Diet and Increase Protein Intake Other: - Refer to ostomy clinic in [facility] if desired Wound Treatment Wound #3 - Ischial  Tuberosity Wound Laterality: Right Cleanser: Byram Ancillary Kit - 15 Day Supply (Generic) 3 x Per Week/30 Days Discharge Instructions: Use supplies as instructed; Kit contains: (15) Saline Bullets; (15) 3x3 Gauze; 15 pr Gloves Cleanser: Normal Saline 3 x Per Week/30 Days Discharge Instructions: Wash your hands with soap and water. Remove old dressing, discard into plastic bag and place into trash. Cleanse the wound with Normal Saline prior to applying a clean dressing using gauze sponges, not tissues or cotton balls. Do not scrub or use excessive force. Pat dry using gauze sponges, not tissue or cotton balls. Cleanser: Wound Cleanser 3 x Per Week/30 Days Discharge Instructions: Wash your  hands with soap and water. Remove old dressing, discard into plastic bag and place into trash. Cleanse the wound with Wound Cleanser prior to applying a clean dressing using gauze sponges, not tissues or cotton balls. Do not scrub or use excessive KAMAREE, WHEATLEY ([id number]) [id number] Page 5 of 9 force. Pat dry using gauze sponges, not tissue or cotton balls. Prim Dressing: Gauze (Generic) 3 x Per Week/30 Days ary Discharge Instructions: moistened with dakins Prim Dressing: Hydrofera Blue Ready Transfer Foam, 2.5x2.5 (in/in) (Generic) 3 x Per Week/30 Days ary Discharge Instructions: Apply Hydrofera Blue Ready to wound bed in a double layer to fill dead space Secondary Dressing: ABD Pad 5x9 (in/in) (Generic) 3 x Per Week/30 Days Discharge Instructions: Cover with ABD pad Secured With: Medipore T - 35M Medipore H Soft Cloth Surgical T ape ape, 2x2 (in/yd) (Generic) 3 x Per Week/30 Days Electronic Signature(s) Signed: 09/02/2022 4:33:44 PM By: Massie Kluver Signed: 09/02/2022 5:46:39 PM By: Worthy Keeler PA-C Entered By: Massie Kluver on 09/02/2022 11:39:05 -------------------------------------------------------------------------------- Problem List Details Patient Name:  Date of Service: [name] [facility], [name] 09/02/2022 10:30 A M Medical Record Number: [id number] Patient Account Number: [id number] Date of Birth/Sex: Treating RN: [birth year] (56 y.o. [name] Primary Care Provider: Fulton Reek Other Clinician: Massie Kluver Referring Provider: Treating Provider/Extender: [name] Weeks in Treatment: 32 Active Problems ICD-10 Encounter Code Description Active Date MDM Diagnosis L89.314 Pressure ulcer of right buttock, stage 4 01/15/2022 No Yes Q76.0 Spina bifida occulta 01/15/2022 No Yes Z93.3 Colostomy status 01/15/2022 No Yes I10 Essential (primary) hypertension 01/15/2022 No Yes I25.10 Atherosclerotic heart disease of native coronary artery without angina pectoris 01/15/2022 No Yes Inactive Problems ICD-10 Code Description Active Date Inactive Date L89.323 Pressure ulcer of left buttock, stage 3 02/05/2022 02/05/2022 [name] ([id number]) [id number] Page 6 of 9 Resolved Problems Electronic Signature(s) Signed: 09/02/2022 10:43:58 AM By: Worthy Keeler PA-C Entered By: Worthy Keeler on 09/02/2022 10:43:58 -------------------------------------------------------------------------------- Progress Note Details Patient Name: Date of Service: [facility], [name] 09/02/2022 10:30 A M Medical Record Number: [id number] Patient Account Number: [id number] Date of Birth/Sex: Treating RN: 1965-11-16 (56 y.o. [name] Primary Care Provider: Fulton Reek Other Clinician: Massie Kluver Referring Provider: Treating Provider/Extender: Rupert Stacks in Treatment: 58 Subjective Chief Complaint Information obtained from Patient Right ischial tuberosity pressure ulcer History of Present Illness (HPI) 56 year old male with a history of spina bifida presenting to [place] with a history of a open wound on the left gluteal region near his upper thigh which she's had for several  weeks. He was seen in the ED recently at [facility] and was put on doxycycline and was advised some local care. his past medical history is significant for spinal bifid, neurogenic bladder, kidney stones, sacral pressure sores, constipation. Past surgical history significant for partial cystectomy, ileal conduit, VP shunt removal, percutaneous nephro lithotripsy. In the remote past the patient says he's had some treatment for a ulcer on this area and was treated with a skin graft. Readmission: 10/16/2021 upon evaluation today patient appears to be doing somewhat poorly in regard to a fairly large wound noted over the right ischial tuberosity location. This is a stage IV pressure ulcer. Of note this is also positive for osteomyelitis upon a CT scan which was performed during the time that he was admitted in the hospital on 09/19/2021. With that being said it is noted in the right inferior buttock that he has  a decubitus ulcer which extends to the posterior toe inferior right ischium where there is evidence of osteomyelitis. When he was here in the office actually missed that this was positive I missed read it and thought it said that there was no osteomyelitis. That is not the case there is in fact osteomyelitis noted here. I actually did review his notes as well in epic. Looking to the discharge summary it appears that the patient was admitted from 09/19/2021 through 09/23/2021. He was discharged home with home health care according to the note. With that being said from what I understand his sister actually takes care of him at this point. He was also to follow-up with a primary care provider and here at the wound care center within a week. I am just now seeing him on the 13th almost a month after discharge. He does have a history of again osteomyelitis of this right hip/ischial tuberosity location. 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 The reason for his admission was actually worsening of the decubitus ulcer upon admission. He does have chronic osteomyelitis of this area. He was treated with empiric antibiotics he had acute kidney injury which improved with IV fluids he also had hypokalemia. Surgical debridement was performed by general surgery at that time and ID was consulted to assist with management according to the notes. IV antibiotics were recommended but patient declined and wanted oral antibiotics at discharge. Therefore no IV antibiotics were planned for discharge time. This case was under the care of Dr. Steva Ready while he was in the hospital when she did discharge him with a 2-week course of doxycycline and Augmentin according to the notes. It looks like Dr. Doy Hutching office did call and leave a message for the patient to get scheduled for a follow-up visit after hospital discharge I do not see where he showed up in fact it appears that the patient has a no-show letter from Dr. Doy Hutching office noted in epic due to a appointment that was missed on 10/06/2021. Looking at the pictures from Dr. Raelene Bott notes on 09/23/2021 it does appear that the wound is a little bit better as far as the cleanliness of the surface of the wound today but nonetheless still is a very significant wound. 10/26/2021 upon evaluation today patient appears to be doing okay in regard to his wound. Again this is a wound that he did indeed have osteomyelitis and previous. With that being said I do not see any signs of a worsening infection at this time which is good news. Overall I think that we are headed in the right direction although still I think he needs more aggressive offloading. 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 I do believe this would be ideal for him to be honest. Readmission: 01-15-2022 upon evaluation today  patient presents for reevaluation he was last seen on October 26, 2021. At that time unfortunately he was having issues with a significant ischial pressure ulcer on the right ischial tuberosity. Subsequently he ended up having decent response to the Dakin's moistened gauze dressing that was previously being utilized and subsequently the patient's sister who is his primary caregiver as well states that she decided just to try to manage this at home. Nonetheless unfortunately this is getting a little larger not better. He  does spend a lot of the day in his chair. He does have an air mattress according what they tell me today. That was previously ordered. With that being said I do believe that at this time things do appear to be doing much better from the standpoint of infection I do not see any signs of overt infection. There is also no evidence of systemic infection. No fevers, chills, nausea, vomiting, or diarrhea. With that being said I do believe based on what I am seeing the wound is a bit cleaner and he possibly could be an excellent candidate for a wound VAC. [place], [name] ([id number]) [id number] Page 7 of 9 02-05-2022 upon evaluation today patient appears to be doing about the same in regard to his wound. Unfortunately he is not any better but he also really has not had the wound VAC on sufficiently. There is been some confusion with the orders part of that I think is our follow-up with the way the order was written part of it may be with how the wound VAC is being placed either way we need to see what we can do to try to improve things in this regard. We will clarify the orders today going forward for home health. Unfortunately the patient does have a new wound today which is on the opposite side. This is not good 1 was bad enough have another area to open is definitely not a good thing. Its not as deep but nonetheless is also not very shallow either. We may  potentially be able to get this to the point that we could bridge the 2 together in order to wound VAC both but right now want a focus on to try to get the wound VAC going for the main wound initially we will likely use Hydrofera Blue on the new wound.Marland Kitchen 02-19-2022 upon evaluation today patient's wound appears to be doing a little bit worse in the way of maceration fortunately there is no signs of active infection locally or systemically which is great news. No fevers, chills, nausea, vomiting, or diarrhea. 03-05-2022 upon evaluation today patient unfortunately does not appear to be doing nearly as good in regard to his wounds as I would like to see. Fortunately I do not see any evidence of active infection that is obvious although I am can obtain a wound culture to ensure that there is not anything getting worse here as far as the wound without the wound VAC on his concern. With that being said I will go ahead and get this sent in and evaluated will initiate treatment as needed going forward if necessary. With regard to the wound with the wound VAC this continues to be placed directly over the wound and there is obvious signs of deep tissue injury. The suction being here means that he is sitting on it I think this may be part of the reason why it is coming off although not 151% certain. 03-12-2022 upon evaluation today patient still has not gotten the antibiotics that were called in for him 4 days ago. He tells me that his sister just told him this morning that they were ready but that she has not had a chance to go pick them up. Nonetheless I feel like that this is really something he needs any should have been taken that not the other antibiotics which were not doing the job for him to be honest. The patient states and I completely agree that there is really no way he would have  known relying on his sister to let him know obviously. Nonetheless I do believe that he needs to not be taking any other  antibiotics and be on the Levaquin as soon as possible. He tells me that she said she was going to pick them up today. In regard to home health I am hopeful that they actually have been bridging this now. I do think that the wound looks better and this is good news. With that being said I am not certain that I can really tell for sure how this was attached it was removed before he came in and the black foam left in place. I really want him to leave the wound VAC in place until he comes in so that we can monitor and make sure that this is being done correctly he voiced understanding. 03-19-2022 upon evaluation today and much happier with how things appear as far as the patient's wounds are concerned. I think he is on a much better track and they are doing a better job at bridging although its not quite where I like to see it is still more posterior which I think the patient needs to be a little bit more lateral on his side. Either way this is something that I did marked with a small dot today for him to tell the home health nurses well so she can bridge it to that location also think the tubing should go up instead of going down to just make it less cumbersome overall for him. 04-19-2022 upon evaluation today patient appears to be doing a little better in regard to his wounds. Fortunately I do not see any signs of infection I do believe he is on the right track. The wound VAC does seem to be helping to clean the wound up a bit and bring it in from the base at work. This is good news. 05-10-2022 upon evaluation today patient appears to be doing well currently in regard to his wounds in general in fact of the smaller of the 2 wounds without the wound VAC actually appears to be doing so much better this is almost completely closed and very pleased in that regard. He has been staying off of this he tells me and shows. With that being said the wound with the wound VAC is going require some sharp debridement there  is some tissue which is not quite as healthy and we are going to go ahead and continue that for probably 2 more weeks although I think we may be closer to discontinuing this as well since it has healed and quite significantly. 05-24-2022 upon evaluation today patient appears to be doing well with regard to his wound in fact this looks better than I have been expected. This actually I think is a good time to go ahead and switch away from the wound VAC based on what I am seeing. I think we go to [facility] with a bordered foam dressing. 06-21-2022 upon evaluation today patient appears to be doing well currently in regard to his wound this is measuring smaller and looks well. Fortunately there does not appear to be any signs of active infection locally or systemically at this time which is great news. No fevers, chills, nausea, vomiting, or diarrhea. 07-29-2022 upon evaluation today patient appears to be doing well currently in regard to his wound. This is actually measuring smaller although there is a lot of hypergranulation noted. Fortunately there does not appear to be any signs of active infection  locally or systemically at this time. No fevers, chills, nausea, vomiting, or diarrhea. 11/9; pressure ulcer on the right buttock in the setting of spina bifida and lower extremity paralysis. He has been using [facility] 09-02-2022 upon evaluation today patient appears to be doing well currently in regard to his wound. We have been using Hydrofera Blue this is showing signs of improvement as far as getting smaller is concerned. There is some drainage around the edges of the wound but I think a lot of this is due to the fact he did not even have a dressing on that he came in today. Objective Constitutional Well-nourished and well-hydrated in no acute distress. Vitals Time Taken: 11:11 AM, Height: 60 in, Temperature: 98.0 F, Pulse: 97 bpm, Respiratory Rate: 16 breaths/min, Blood Pressure: 103/66  mmHg. Respiratory normal breathing without difficulty. Psychiatric this patient is able to make decisions and demonstrates good insight into disease process. Alert and Oriented x 3. pleasant and cooperative. General Notes: Upon inspection patient's wound bed actually showed signs of good granulation epithelization at this point. Fortunately I do not see any evidence of infection locally nor systemically at this time which is great news and overall I am extremely pleased with where things stand today. Integumentary (Hair, Skin) Wound #3 status is Open. Original cause of wound was Gradually Appeared. The date acquired was: 09/03/2021. The wound has been in treatment 32 weeks. The wound is located on the Right Ischial Tuberosity. The wound measures 1.9cm length x 3.7cm width x 1cm depth; 5.521cm^2 area and 5.521cm^3 volume. There is Fat Layer (Subcutaneous Tissue) exposed. There is no tunneling or undermining noted. There is a large amount of serosanguineous drainage noted. There is large (67-100%) red, pink granulation within the wound bed. There is a small (1-33%) amount of necrotic tissue within the wound bed including Ramiel Forti, [name] ([id number]) [id number] Page 8 of 9 and [place]. Assessment Active Problems ICD-10 Pressure ulcer of right buttock, stage 4 Spina bifida occulta Colostomy status Essential (primary) hypertension Atherosclerotic heart disease of native coronary artery without angina pectoris Procedures Wound #3 Pre-procedure diagnosis of Wound #3 is a Pressure Ulcer located on the Right Ischial Tuberosity . An CHEM CAUT GRANULATION TISS procedure was performed by Tommie Sams., PA-C. Post procedure Diagnosis Wound #3: Same as Pre-Procedure Notes: one silver nitrate stick used to right ischial tuberosity Plan Follow-up Appointments: Return Appointment in 2 weeks. Nurse Visit as needed Home Health: [facility]: -  [facility] ADMIT to [facility] for wound care. May utilize formulary equivalent dressing for wound treatment orders unless otherwise specified. Home Health Nurse may visit PRN to address patientoos wound care needs. Scheduled days for dressing changes to be completed; exception, patient has scheduled wound care visit that day. **Please direct any NON-WOUND related issues/requests for orders to patient's Primary Care Physician. **If current dressing causes regression in wound condition, may D/C ordered dressing product/s and apply Normal Saline Moist Dressing daily until next [facility] or Other MD appointment. **Notify [facility] of regression in wound condition at [phone]. Bathing/ Shower/ Hygiene: Clean wound with Normal Saline or wound cleanser. - keep dressing dry or change after shower No tub bath. Off-Loading: Gel wheelchair cushion Hospital bed/mattress - Has at home Air fluidized (Group 3) - Has from [employer] and reposition every 2 hours - Do not sit in wheelchair any longer than you have to--offload wound in bed as much as possible Other: - avoid sliding which causes friction as much  as you can Additional Orders / Instructions: Follow Nutritious Diet and Increase Protein Intake Other: - Refer to ostomy clinic in [facility] if desired WOUND #3: - Ischial Tuberosity Wound Laterality: Right Cleanser: Byram Ancillary Kit - 15 Day Supply (Generic) 3 x Per Week/30 Days Discharge Instructions: Use supplies as instructed; Kit contains: (15) Saline Bullets; (15) 3x3 Gauze; 15 pr Gloves Cleanser: Normal Saline 3 x Per Week/30 Days Discharge Instructions: Wash your hands with soap and water. Remove old dressing, discard into plastic bag and place into trash. Cleanse the wound with Normal Saline prior to applying a clean dressing using gauze sponges, not tissues or cotton balls. Do not scrub or use excessive force. Pat dry using gauze sponges, not tissue or cotton  balls. Cleanser: Wound Cleanser 3 x Per Week/30 Days Discharge Instructions: Wash your hands with soap and water. Remove old dressing, discard into plastic bag and place into trash. Cleanse the wound with Wound Cleanser prior to applying a clean dressing using gauze sponges, not tissues or cotton balls. Do not scrub or use excessive force. Pat dry using gauze sponges, not tissue or cotton balls. Prim Dressing: Gauze (Generic) 3 x Per Week/30 Days ary Discharge Instructions: moistened with dakins Prim Dressing: Hydrofera Blue Ready Transfer Foam, 2.5x2.5 (in/in) (Generic) 3 x Per Week/30 Days ary Discharge Instructions: Apply Hydrofera Blue Ready to wound bed in a double layer to fill dead space Secondary Dressing: ABD Pad 5x9 (in/in) (Generic) 3 x Per Week/30 Days Discharge Instructions: Cover with ABD pad Secured With: Medipore T - 58M Medipore H Soft Cloth Surgical T ape ape, 2x2 (in/yd) (Generic) 3 x Per Week/30 Days 1. Based on what I see I do feel like that the patient could benefit from chemical cauterization with silver nitrate which I did perform today he tolerated that without any pain or complication. 2. I am also can recommend that we have the patient continue to monitor for any evidence of infection or worsening. Obviously if anything changes he knows contact the office and let me know. 3. I am also going to suggest that the patient should continue to as much as possible stay off of his gluteal region and also attempt to reposition and avoid friction and shearing over this area. We will see patient back for reevaluation in 2 weeks here in the clinic. If anything worsens or changes patient will contact our office for additional recommendations. [place], [name] ([id number]) [id number] Page 9 of 9 Electronic Signature(s) Signed: 09/02/2022 11:48:00 AM By: Worthy Keeler PA-C Entered By: Worthy Keeler on 09/02/2022  11:47:59 -------------------------------------------------------------------------------- SuperBill Details Patient Name: Date of Service: [place] [facility], [name] 09/02/2022 Medical Record Number: [id number] Patient Account Number: [id number] Date of Birth/Sex: Treating RN: 1966/01/08 (56 y.o. [name] Primary Care Provider: Fulton Reek Other Clinician: Massie Kluver Referring Provider: Treating Provider/Extender: Rupert Stacks in Treatment: 32 Diagnosis Coding ICD-10 Codes Code Description L89.314 Pressure ulcer of right buttock, stage 4 Q76.0 Spina bifida occulta Z93.3 Colostomy status I10 Essential (primary) hypertension I25.10 Atherosclerotic heart disease of native coronary artery without angina pectoris Facility Procedures : CPT4 Code: [id number] Description: 63893 - CHEM CAUT GRANULATION TISS ICD-10 Diagnosis Description L89.314 Pressure ulcer of right buttock, stage 4 Modifier: Quantity: 1 Physician Procedures : CPT4 Code Description Modifier [id number] - WC PHYS CHEM CAUT GRAN TISSUE ICD-10 Diagnosis Description L89.314 Pressure ulcer of right buttock, stage 4 Quantity: 1 Electronic Signature(s) Signed: 09/02/2022 11:48:09 AM By: Worthy Keeler PA-C Entered  By: Worthy Keeler on 09/02/2022 11:48:08

## 2022-09-16 ENCOUNTER — Ambulatory Visit: Payer: Medicare Other | Admitting: Physician Assistant

## 2022-10-01 ENCOUNTER — Encounter: Payer: Medicare Other | Attending: Internal Medicine | Admitting: Internal Medicine

## 2022-10-01 DIAGNOSIS — L89314 Pressure ulcer of right buttock, stage 4: Secondary | ICD-10-CM | POA: Diagnosis present

## 2022-10-01 DIAGNOSIS — Q760 Spina bifida occulta: Secondary | ICD-10-CM | POA: Diagnosis not present

## 2022-10-01 DIAGNOSIS — N319 Neuromuscular dysfunction of bladder, unspecified: Secondary | ICD-10-CM | POA: Diagnosis not present

## 2022-10-01 DIAGNOSIS — N183 Chronic kidney disease, stage 3 unspecified: Secondary | ICD-10-CM | POA: Diagnosis not present

## 2022-10-01 DIAGNOSIS — I129 Hypertensive chronic kidney disease with stage 1 through stage 4 chronic kidney disease, or unspecified chronic kidney disease: Secondary | ICD-10-CM | POA: Insufficient documentation

## 2022-10-01 DIAGNOSIS — G8220 Paraplegia, unspecified: Secondary | ICD-10-CM | POA: Insufficient documentation

## 2022-10-01 DIAGNOSIS — Z933 Colostomy status: Secondary | ICD-10-CM | POA: Diagnosis not present

## 2022-10-01 DIAGNOSIS — M1990 Unspecified osteoarthritis, unspecified site: Secondary | ICD-10-CM | POA: Insufficient documentation

## 2022-10-06 NOTE — Progress Notes (Signed)
[name], [name] ([id number]) [id number] Page 1 of 8 Visit Report for 10/01/2022 Arrival Information Details Patient Name: Date of Service: [name] [facility], DO [place] LD 10/01/2022 10:15 A M Medical Record Number: [id number] Patient Account Number: [id number] Date of Birth/Sex: Treating RN: [birth year] (57 y.o. [name] Primary Care Ivonna Kinnick: Fulton Reek Other Clinician: Massie Kluver Referring Rishit Burkhalter: Treating Jaeliana Lococo/Extender: RO BSO Delane Ginger, MICHA EL Ginny Forth in Treatment: 19 Visit Information History Since Last Visit All ordered tests and consults were completed: No Patient Arrived: Wheel Chair Added or deleted any medications: No Arrival Time: 10:04 Any new allergies or adverse reactions: No Transfer Assistance: Transfer Board Had a fall or experienced change in No Patient Identification Verified: Yes activities of daily living that may affect Secondary Verification Process Completed: Yes risk of falls: Patient Requires Transmission-Based Precautions: No Signs or symptoms of abuse/neglect since last visito No Patient Has Alerts: Yes Hospitalized since last visit: No Patient Alerts: NOT diabetic Implantable device outside of the clinic excluding No cellular tissue based products placed in the center since last visit: Has Dressing in Place as Prescribed: Yes Pain Present Now: No Electronic Signature(s) Signed: 10/06/2022 2:03:30 PM By: Massie Kluver Entered By: Massie Kluver on 10/01/2022 10:07:06 -------------------------------------------------------------------------------- Clinic Level of Care Assessment Details Patient Name: Date of ServiceMila [name] [facility], [name] 10/01/2022 10:15 A M Medical Record Number: [id number] Patient Account Number: [id number] Date of Birth/Sex: Treating RN: [birth year] (56 y.o. [name] Primary Care Mariah Gerstenberger: Fulton Reek Other Clinician: Massie Kluver Referring Jillane Po: Treating  Jervon Ream/Extender: RO BSO N, MICHA EL Ginny Forth in Treatment: 37 Clinic Level of Care Assessment Items TOOL 1 Quantity Score _0  - 0 Use when EandM and Procedure is performed on INITIAL visit ASSESSMENTS - Nursing Assessment / Reassessment _1  - 0 General Physical Exam (combine w/ comprehensive assessment (listed just below) when performed on new pt. evals) _2  - 0 Comprehensive Assessment (HX, ROS, Risk Assessments, Wounds Hx, etc.) AMOS, MICHEALS ([id number]) [id number] Page 2 of 8 ASSESSMENTS - Wound and Skin Assessment / Reassessment _3  - 0 Dermatologic / Skin Assessment (not related to wound area) ASSESSMENTS - Ostomy and/or Continence Assessment and Care _4  - 0 Incontinence Assessment and Management _5  - 0 Ostomy Care Assessment and Management (repouching, etc.) PROCESS - Coordination of Care _6  - 0 Simple Patient / Family Education for ongoing care _7  - 0 Complex (extensive) Patient / Family Education for ongoing care _8  - 0 Staff obtains Programmer, systems, Records, T Results / Process Orders est _9  - 0 Staff telephones HHA, Nursing Homes / Clarify orders / etc _10  - 0 Routine Transfer to another Facility (non-emergent condition) _11  - 0 Routine Hospital Admission (non-emergent condition) _12  - 0 New Admissions / Biomedical engineer / Ordering NPWT Apligraf, etc. , _13  - 0 Emergency Hospital Admission (emergent condition) PROCESS - Special Needs _14  - 0 Pediatric / Minor Patient Management _15  - 0 Isolation Patient Management _16  - 0 Hearing / Language / Visual special needs _17  - 0 Assessment of Community assistance (transportation, D/C planning, etc.) _18  - 0 Additional assistance / Altered mentation _19  - 0 Support Surface(s) Assessment (bed, cushion, seat, etc.) INTERVENTIONS - Miscellaneous _20  - 0 External ear exam _21  - 0 Patient Transfer (multiple staff / Civil Service fast streamer / Similar devices) _22  - 0 Simple Staple / Suture  removal (25 or less) _23  - 0 Complex Staple / Suture removal (26 or more) _24  - 0 Hypo/Hyperglycemic Management (do not check if billed separately) _25  -  0 Ankle / Brachial Index (ABI) - do not check if billed separately Has the patient been seen at the hospital within the last three years: Yes Total Score: 0 Level Of Care: ____ Electronic Signature(s) Signed: 10/06/2022 2:03:30 PM By: Massie Kluver Entered By: Massie Kluver on 10/01/2022 10:40:25 -------------------------------------------------------------------------------- Encounter Discharge Information Details Patient Name: Date of Service: [name] [facility], [name] 10/01/2022 10:15 A M Medical Record Number: [id number] Patient Account Number: [id number] Date of Birth/Sex: Treating RN: 1966/03/11 (57 y.o. [name] Primary Care Chiann Goffredo: Fulton Reek Other Clinician: Massie Kluver Referring Besan Ketchem: Treating Sima Lindenberger/Extender: [address], [place], [place], [place] ([id number]) [id number] Page 3 of 8 Weeks in Treatment: 37 Encounter Discharge Information Items Discharge Condition: Stable Ambulatory Status: Wheelchair Discharge Destination: Home Transportation: Other Accompanied By: self Schedule Follow-up Appointment: Yes Clinical Summary of Care: Electronic Signature(s) Signed: 10/06/2022 2:03:30 PM By: Massie Kluver Entered By: Massie Kluver on 10/01/2022 10:52:59 -------------------------------------------------------------------------------- Lower Extremity Assessment Details Patient Name: Date of ServiceMila [name] [facility], [name] 10/01/2022 10:15 A M Medical Record Number: [id number] Patient Account Number: [id number] Date of Birth/Sex: Treating RN: Oct 12, 1965 (57 y.o. [name] Primary Care Nicasio Barlowe: Fulton Reek Other Clinician: Massie Kluver Referring Terriona Horlacher: Treating Wannetta Langland/Extender: RO BSO Delane Ginger, MICHA EL Ginny Forth in Treatment:  37 Electronic Signature(s) Signed: 10/01/2022 1:03:02 PM By: [name] MSN RN CNS WTA Signed: 10/06/2022 2:03:30 PM By: Massie Kluver Entered By: Massie Kluver on 10/01/2022 10:17:06 -------------------------------------------------------------------------------- Multi Wound Chart Details Patient Name: Date of Service: [name] [facility], [name] 10/01/2022 10:15 A M Medical Record Number: [id number] Patient Account Number: [id number] Date of Birth/Sex: Treating RN: 05/14/1966 (57 y.o. [name] Primary Care Hance Caspers: Fulton Reek Other Clinician: Massie Kluver Referring Grantham Hippert: Treating Kazoua Gossen/Extender: RO BSO N, MICHA EL Ginny Forth in Treatment: 42 Vital Signs Height(in): 60 Pulse(bpm): 5 Weight(lbs): Blood Pressure(mmHg): 116/74 Body Mass Index(BMI): Temperature(F): 97.2 Respiratory Rate(breaths/min): 16 [Stange, Henley ([id number]):Photos: Wound Location: Wounding Event: Primary Etiology: Comorbid History: Date Acquired: Weeks of Treatment: Wound Status: Wound Recurrence: Measurements L x W x D (cm) A (cm) : rea Volume (cm) : % Reduction in A %  Reduction in Volume: Classification: Exudate A mount: Exudate Type: Exudate Color: Granulation A mount: Granulation Quality: Necrotic A mount: Necrotic Tissue: Exposed Structures: Epithelialization:] [[id number] Page 4 of 8:3  N/A N/A No Photos N/A N/A Right Ischial Tuberosity N/A N/A Gradually Appeared N/A N/A Pressure Ulcer N/A N/A Cataracts, Glaucoma, Coronary Artery N/A N/A Disease, Hypertension, History of pressure wounds, Osteoarthritis, Paraplegia 09/03/2021 N/A N/A 37  N/A N/A Open N/A N/A No N/A N/A 2.5x4.5x0.7 N/A N/A 8.836 N/A N/A 6.185 N/A N/A 49.80% N/A N/A rea: 85.90% N/A N/A Category/Stage IV N/A N/A Large N/A N/A Serosanguineous N/A N/A red, brown N/A N/A Large (67-100%) N/A N/A Red, Pink N/A N/A Small (1-33%)  N/A N/A Eschar, Adherent Slough N/A N/A Fat Layer  (Subcutaneous Tissue): Yes N/A N/A None N/A N/A] Treatment Notes Electronic Signature(s) Signed: 10/06/2022 2:03:30 PM By: Massie Kluver Entered By: Massie Kluver on 10/01/2022 10:17:10 -------------------------------------------------------------------------------- Multi-Disciplinary Care Plan Details Patient Name: Date of Service: [name] [facility], [name] 10/01/2022 10:15 A M Medical Record Number: [id number] Patient Account Number: [id number] Date of Birth/Sex: Treating RN: Apr 24, 1966 (57 y.o. [name] Primary Care Zayne Draheim: Fulton Reek Other Clinician: Massie Kluver Referring Corgan Mormile: Treating Ramonia Mcclaran/Extender: RO BSO N, MICHA EL Ginny Forth in Treatment: 37 Active Inactive Pressure Nursing Diagnoses:  Knowledge deficit related to causes and risk factors for pressure ulcer development Knowledge deficit related to management of pressures ulcers Potential for impaired tissue integrity related to pressure, friction, moisture, and shear Goals: Patient will remain free from development of additional pressure ulcers Date Initiated: 01/15/2022 Target Resolution Date: 02/26/2022 Goal Status: Active Patient/caregiver will verbalize risk factors for pressure ulcer development Date Initiated: 01/15/2022 Target Resolution Date: 03/05/2022 Goal Status: Active [place], [name] ([id number]) [id number] Page 5 of 8 Interventions: Assess: immobility, friction, shearing, incontinence upon admission and as needed Assess offloading mechanisms upon admission and as needed Assess potential for pressure ulcer upon admission and as needed Notes: Electronic Signature(s) Signed: 10/01/2022 1:03:02 PM By: [name] MSN RN CNS WTA Signed: 10/06/2022 2:03:30 PM By: Massie Kluver Entered By: Massie Kluver on 10/01/2022 10:40:52 -------------------------------------------------------------------------------- Pain Assessment Details Patient Name: Date of  Service: [name] [facility], [name] 10/01/2022 10:15 A M Medical Record Number: [id number] Patient Account Number: [id number] Date of Birth/Sex: Treating RN: Jun 25, 1966 (57 y.o. [name] Primary Care Kathryne Ramella: Fulton Reek Other Clinician: Massie Kluver Referring Chaylee Ehrsam: Treating Tonnette Zwiebel/Extender: RO BSO N, Lorenzo EL Ginny Forth in Treatment: 37 Active Problems Location of Pain Severity and Description of Pain Patient Has Paino No Site Locations Pain Management and Medication Current Pain Management: Electronic Signature(s) Signed: 10/01/2022 1:03:02 PM By: [name] MSN RN CNS WTA Signed: 10/06/2022 2:03:30 PM By: Massie Kluver Entered By: Massie Kluver on 10/01/2022 10:10:28 [name] ([id number]) [id number] Page 6 of 8 -------------------------------------------------------------------------------- Patient/Caregiver Education Details Patient Name: Date of Service: [name] [facility], [name] 12/29/2023andnbsp10:15 A M Medical Record Number: [id number] Patient Account Number: [id number] Date of Birth/Gender: Treating RN: 1965/11/20 (57 y.o. [name] Primary Care Physician: Fulton Reek Other Clinician: Massie Kluver Referring Physician: Treating Physician/Extender: RO BSO Delane Ginger, Hosmer Ginny Forth in Treatment: 48 Education Assessment Education Provided To: Patient Education Topics Provided Wound/Skin Impairment: Handouts: Other: continue wound care as directed Methods: Explain/Verbal Responses: State content correctly Electronic Signature(s) Signed: 10/06/2022 2:03:30 PM By: Massie Kluver Entered By: Massie Kluver on 10/01/2022 10:40:48 -------------------------------------------------------------------------------- Wound Assessment Details Patient Name: Date of Service: [name] [facility], [name] 10/01/2022 10:15 A M Medical Record Number: [id number] Patient Account Number: [id number] Date of  Birth/Sex: Treating RN: October 23, 1965 (57 y.o. [name] Primary Care Dolph Tavano: Fulton Reek Other Clinician: Massie Kluver Referring Ashlee Bewley: Treating Brenyn Petrey/Extender: RO BSO N, MICHA EL Ginny Forth in Treatment: 46 Wound Status Wound Number: 3 Primary Pressure Ulcer Etiology: Wound Location: Right Ischial Tuberosity Wound Open Wounding Event: Gradually Appeared Status: Date Acquired: 09/03/2021 Comorbid Cataracts, Glaucoma, Coronary Artery Disease, Hypertension, Weeks Of Treatment: 37 History: History of pressure wounds, Osteoarthritis, Paraplegia Clustered Wound: No Photos [place], [name] ([id number]) [id number] Page 7 of 8 Wound Measurements Length: (cm) 2.5 Width: (cm) 4.5 Depth: (cm) 0.7 Area: (cm) 8.836 Volume: (cm) 6.185 % Reduction in Area: 49.8% % Reduction in Volume: 85.9% Epithelialization: None Wound Description Classification: Category/Stage IV Exudate Amount: Large Exudate Type: Serosanguineous Exudate Color: red, brown Foul Odor After Cleansing: No Slough/Fibrino Yes Wound Bed Granulation Amount: Large (67-100%) Exposed Structure Granulation Quality: Red, Pink Fat Layer (Subcutaneous Tissue) Exposed: Yes Necrotic Amount: Small (1-33%) Necrotic Quality: Eschar, Adherent Slough Treatment Notes Wound #3 (Ischial Tuberosity) Wound Laterality: Right Cleanser [place] Ancillary Kit - 15 Day Supply Discharge Instruction: Use supplies as instructed; Kit contains: (15) Saline Bullets; (15) 3x3 Gauze; 15 pr Gloves Normal Saline Discharge Instruction: Wash your hands with soap  and water. Remove old dressing, discard into plastic bag and place into trash. Cleanse the wound with Normal Saline prior to applying a clean dressing using gauze sponges, not tissues or cotton balls. Do not scrub or use excessive force. Pat dry using gauze sponges, not tissue or cotton balls. Wound Cleanser Discharge Instruction: Wash  your hands with soap and water. Remove old dressing, discard into plastic bag and place into trash. Cleanse the wound with Wound Cleanser prior to applying a clean dressing using gauze sponges, not tissues or cotton balls. Do not scrub or use excessive force. Pat dry using gauze sponges, not tissue or cotton balls. Peri-Wound Care Topical Primary Dressing Gauze Discharge Instruction: moistened with dakins Hydrofera Blue Ready Transfer Foam, 2.5x2.5 (in/in) Discharge Instruction: Apply Hydrofera Blue Ready to wound bed in a double layer to fill dead space Secondary Dressing ABD Pad 5x9 (in/in) Discharge Instruction: Cover with ABD pad Secured With [employer] T ape ape, 2x2 (in/yd) Compression Wrap Compression Stockings Add-Ons Electronic Signature(s) Signed: 10/01/2022 1:03:02 PM By: [name] MSN RN CNS WTA Signed: 10/06/2022 2:03:30 PM By: Massie Kluver Entered By: Massie Kluver on 10/01/2022 10:18:52 [name] ([id number]) [id number] Page 8 of 8 -------------------------------------------------------------------------------- Vitals Details Patient Name: Date of ServiceMila [name] [facility], [name] 10/01/2022 10:15 A M Medical Record Number: [id number] Patient Account Number: [id number] Date of Birth/Sex: Treating RN: 17-Jul-1966 (57 y.o. [name] Primary Care Earland Reish: Fulton Reek Other Clinician: Massie Kluver Referring Ayaat Jansma: Treating Levern Kalka/Extender: RO BSO N, MICHA EL Ginny Forth in Treatment: 37 Vital Signs Time Taken: 10:07 Temperature (F): 97.2 Height (in): 60 Pulse (bpm): 93 Respiratory Rate (breaths/min): 16 Blood Pressure (mmHg): 116/74 Reference Range: 80 - 120 mg / dl Electronic Signature(s) Signed: 10/06/2022 2:03:30 PM By: Massie Kluver Entered By: Massie Kluver on 10/01/2022 10:10:24

## 2022-10-06 NOTE — Progress Notes (Signed)
Ten[place], [name] ([id number]) [id number] Page 1 of 9 Visit Report for 10/01/2022 HPI Details Patient Name: Date of Service: [name] [facility], [name] 10/01/2022 10:15 A M Medical Record Number: [id number] Patient Account Number: [id number] Date of Birth/Sex: Treating RN: 19-Apr-1966 (57 y.o. [name] Primary Care Provider: Fulton Reek Other Clinician: Massie Kluver Referring Provider: Treating Provider/Extender: RO BSO N, MICHA EL Ginny Forth in Treatment: 71 History of Present Illness HPI Description: 57 year old male with a history of spina bifida presenting to [place] with a history of a open wound on the left gluteal region near his upper thigh which she's had for several weeks. He was seen in the ED recently at [facility] and was put on doxycycline and was advised some local care. his past medical history is significant for spinal bifid, neurogenic bladder, kidney stones, sacral pressure sores, constipation. Past surgical history significant for partial cystectomy, ileal conduit, VP shunt removal, percutaneous nephro lithotripsy. In the remote past the patient says he's had some treatment for a ulcer on this area and was treated with a skin graft. Readmission: 10/16/2021 upon evaluation today patient appears to be doing somewhat poorly in regard to a fairly large wound noted over the right ischial tuberosity location. This is a stage IV pressure ulcer. Of note this is also positive for osteomyelitis upon a CT scan which was performed during the time that he was admitted in the hospital on 09/19/2021. With that being said it is noted in the right inferior buttock that he has a decubitus ulcer which extends to the posterior toe inferior right ischium where there is evidence of osteomyelitis. When he was here in the office actually missed that this was positive I missed read it and thought it said that there was no osteomyelitis. That is  not the case there is in fact osteomyelitis noted here. I actually did review his notes as well in epic. Looking to the discharge summary it appears that the patient was admitted from 09/19/2021 through 09/23/2021. He was discharged home with home health care according to the note. With that being said from what I understand his sister actually takes care of him at this point. He was also to follow-up with a primary care provider and here at the wound care center within a week. I am just now seeing him on the 13th almost a month after discharge. He does have a history of again osteomyelitis of this right hip/ischial tuberosity location. 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 The reason for his admission was actually worsening of the decubitus ulcer upon admission. He does have chronic osteomyelitis of this area. He was treated with empiric antibiotics he had acute kidney injury which improved with IV fluids he also had hypokalemia. Surgical debridement was performed by general surgery at that time and ID was consulted to assist with management according to the notes. IV antibiotics were recommended but patient declined and wanted oral antibiotics at discharge. Therefore no IV antibiotics were planned for discharge time. This case was under the care of Dr. Steva Ready while he was in the hospital when she did discharge him with a 2-week course of doxycycline and Augmentin according to the notes. It looks like Dr. Doy Hutching office did call and leave a message for the patient to get scheduled for a  follow-up visit after hospital discharge I do not see where he showed up in fact it appears that the patient has a no-show letter from Dr. Doy Hutching office noted in epic due to a appointment that was missed on 10/06/2021. Looking at the pictures from Dr. Raelene Bott  notes on 09/23/2021 it does appear that the wound is a little bit better as far as the cleanliness of the surface of the wound today but nonetheless still is a very significant wound. 10/26/2021 upon evaluation today patient appears to be doing okay in regard to his wound. Again this is a wound that he did indeed have osteomyelitis and previous. With that being said I do not see any signs of a worsening infection at this time which is good news. Overall I think that we are headed in the right direction although still I think he needs more aggressive offloading. 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 I do believe this would be ideal for him to be honest. Readmission: 01-15-2022 upon evaluation today patient presents for reevaluation he was last seen on October 26, 2021. At that time unfortunately he was having issues with a significant ischial pressure ulcer on the right ischial tuberosity. Subsequently he ended up having decent response to the Dakin's moistened gauze dressing that was previously being utilized and subsequently the patient's sister who is his primary caregiver as well states that she decided just to try to manage this at home. Nonetheless unfortunately this is getting a little larger not better. He does spend a lot of the day in his chair. He does have an air mattress according what they tell me today. That was previously ordered. With that being said I do believe that at this time things do appear to be doing much better from the standpoint of infection I do not see any signs of overt infection. There is also no evidence of systemic infection. No fevers, chills, nausea, vomiting, or diarrhea. With that being said I do believe based on what I am seeing the wound is a bit cleaner and he possibly could be an excellent candidate for a wound VAC. 02-05-2022 upon evaluation today patient appears to be doing about the same in regard to his wound. Unfortunately he is  not any better but he also really has not had the wound VAC on sufficiently. There is been some confusion with the orders part of that I think is our follow-up with the way the order was written part of it may be with how the wound VAC is being placed either way we need to see what we can do to try to improve things in this regard. We will clarify the orders today going forward for home health. Unfortunately the patient does have a new wound today which is on the opposite side. This is not good 1 was bad enough have another area to open is definitely not a good thing. Its not as deep but nonetheless is also not very shallow either. We may potentially be able to get this to the point that we could bridge the 2 together in order to wound VAC both but right now want a focus on to try to get the wound VAC going for the main wound initially we will likely use Hydrofera Blue on the new wound.Marland Kitchen 02-19-2022 upon evaluation today patient's wound appears to be doing a little bit worse in the way of maceration fortunately there is no signs of active infection locally or  systemically which is great news. No fevers, chills, nausea, vomiting, or diarrhea. [place], [name] ([id number]) [id number] Page 2 of 9 03-05-2022 upon evaluation today patient unfortunately does not appear to be doing nearly as good in regard to his wounds as I would like to see. Fortunately I do not see any evidence of active infection that is obvious although I am can obtain a wound culture to ensure that there is not anything getting worse here as far as the wound without the wound VAC on his concern. With that being said I will go ahead and get this sent in and evaluated will initiate treatment as needed going forward if necessary. With regard to the wound with the wound VAC this continues to be placed directly over the wound and there is obvious signs of deep tissue injury. The suction being here means that he is  sitting on it I think this may be part of the reason why it is coming off although not 093% certain. 03-12-2022 upon evaluation today patient still has not gotten the antibiotics that were called in for him 4 days ago. He tells me that his sister just told him this morning that they were ready but that she has not had a chance to go pick them up. Nonetheless I feel like that this is really something he needs any should have been taken that not the other antibiotics which were not doing the job for him to be honest. The patient states and I completely agree that there is really no way he would have known relying on his sister to let him know obviously. Nonetheless I do believe that he needs to not be taking any other antibiotics and be on the Levaquin as soon as possible. He tells me that she said she was going to pick them up today. In regard to home health I am hopeful that they actually have been bridging this now. I do think that the wound looks better and this is good news. With that being said I am not certain that I can really tell for sure how this was attached it was removed before he came in and the black foam left in place. I really want him to leave the wound VAC in place until he comes in so that we can monitor and make sure that this is being done correctly he voiced understanding. 03-19-2022 upon evaluation today and much happier with how things appear as far as the patient's wounds are concerned. I think he is on a much better track and they are doing a better job at bridging although its not quite where I like to see it is still more posterior which I think the patient needs to be a little bit more lateral on his side. Either way this is something that I did marked with a small dot today for him to tell the home health nurses well so she can bridge it to that location also think the tubing should go up instead of going down to just make it less cumbersome overall for him. 04-19-2022 upon  evaluation today patient appears to be doing a little better in regard to his wounds. Fortunately I do not see any signs of infection I do believe he is on the right track. The wound VAC does seem to be helping to clean the wound up a bit and bring it in from the base at work. This is good news. 05-10-2022 upon evaluation today patient appears to be doing well currently  in regard to his wounds in general in fact of the smaller of the 2 wounds without the wound VAC actually appears to be doing so much better this is almost completely closed and very pleased in that regard. He has been staying off of this he tells me and shows. With that being said the wound with the wound VAC is going require some sharp debridement there is some tissue which is not quite as healthy and we are going to go ahead and continue that for probably 2 more weeks although I think we may be closer to discontinuing this as well since it has healed and quite significantly. 05-24-2022 upon evaluation today patient appears to be doing well with regard to his wound in fact this looks better than I have been expected. This actually I think is a good time to go ahead and switch away from the wound VAC based on what I am seeing. I think we go to [facility] with a bordered foam dressing. 06-21-2022 upon evaluation today patient appears to be doing well currently in regard to his wound this is measuring smaller and looks well. Fortunately there does not appear to be any signs of active infection locally or systemically at this time which is great news. No fevers, chills, nausea, vomiting, or diarrhea. 07-29-2022 upon evaluation today patient appears to be doing well currently in regard to his wound. This is actually measuring smaller although there is a lot of hypergranulation noted. Fortunately there does not appear to be any signs of active infection locally or systemically at this time. No fevers, chills, nausea, vomiting, or  diarrhea. 11/9; pressure ulcer on the right buttock in the setting of spina bifida and lower extremity paralysis. He has been using [facility] 09-02-2022 upon evaluation today patient appears to be doing well currently in regard to his wound. We have been using Hydrofera Blue this is showing signs of improvement as far as getting smaller is concerned. There is some drainage around the edges of the wound but I think a lot of this is due to the fact he did not even have a dressing on that he came in today. 12/29; right buttock pressure area which was initially stage IV. This is in the setting of a patient with spina bifida spends most of his time in a wheelchair. I am not certain how much he is offloading this. He has been using for [facility] as a primary dressing. When he was here the last visit he was felt to have hyper granulated tissue and was treated with silver nitrate chemical cauterization Electronic Signature(s) Signed: 10/01/2022 12:28:22 PM By: Linton Ham MD Entered By: Linton Ham on 10/01/2022 10:42:20 -------------------------------------------------------------------------------- Otelia Sergeant TISS Details Patient Name: Date of Service: [name] TH, [name] 10/01/2022 10:15 A M Medical Record Number: [id number] Patient Account Number: [id number] Date of Birth/Sex: Treating RN: 09/02/1966 (57 y.o. [name] Primary Care Provider: Fulton Reek Other Clinician: Massie Kluver Referring Provider: Treating Provider/Extender: RO BSO Delane Ginger, MICHA EL Ginny Forth in Treatment: 37 Procedure Performed for: Wound #3 Right Ischial Tuberosity Performed By: Physician Ricard Dillon, MD Post Procedure Diagnosis Same as [place], [place] ([id number]) [id number] Page 3 of 9 Notes 4 silver nitrate sticks used Electronic Signature(s) Signed: 10/06/2022 2:03:30 PM By: Massie Kluver Entered By: Massie Kluver on 10/01/2022 10:40:00 -------------------------------------------------------------------------------- Physical Exam Details Patient Name: Date of Service: [name] TH, [name] 10/01/2022 10:15 A M Medical Record  Number: [id number] Patient Account Number: [id number] Date of Birth/Sex: Treating RN: 11/28/1965 (57 y.o. [name] Primary Care Provider: Fulton Reek Other Clinician: Massie Kluver Referring Provider: Treating Provider/Extender: RO BSO N, [place] EL Ginny Forth in Treatment: 37 Constitutional Sitting or standing Blood Pressure is within target range for patient.. Pulse regular and within target range for patient.Marland Kitchen Respirations regular, non-labored and within target range.. Temperature is normal and within the target range for the patient.Marland Kitchen appears in no distress. Notes Wound exam; right buttock. The patient has granulation however a significant amount of hypergranulation. There is no evidence of surrounding infection he does have a rim of epithelialization but per our measurements this is not advancing Electronic Signature(s) Signed: 10/01/2022 12:28:22 PM By: Linton Ham MD Entered By: Linton Ham on 10/01/2022 10:43:40 -------------------------------------------------------------------------------- Physician Orders Details Patient Name: Date of Service: [name] TH, [name] 10/01/2022 10:15 A M Medical Record Number: [id number] Patient Account Number: [id number] Date of Birth/Sex: Treating RN: 15-Jan-1966 (57 y.o. [name] Primary Care Provider: Fulton Reek Other Clinician: Massie Kluver Referring Provider: Treating Provider/Extender: RO BSO N, MICHA EL Ginny Forth in Treatment: [phone] Verbal / Phone Orders: No Diagnosis Coding Follow-up Appointments Return Appointment in 2 weeks. Nurse Visit as needed [place]: - [facility] ADMIT to [place] for wound care. May utilize formulary  equivalent dressing for wound treatment orders unless otherwise specified. [place], [name] ([id number]) [id number] Page 4 of 9 Home Health Nurse may visit PRN to address patients wound care needs. Scheduled days for dressing changes to be completed; exception, patient has scheduled wound care visit that day. **Please direct any NON-WOUND related issues/requests for orders to patient's Primary Care Physician. **If current dressing causes regression in wound condition, may D/C ordered dressing product/s and apply Normal Saline Moist Dressing daily until next [place] or Other MD appointment. **Notify [facility] of regression in wound condition at [phone]. Bathing/ Shower/ Hygiene Clean wound with Normal Saline or wound cleanser. - keep dressing dry or change after shower No tub bath. Off-Loading Gel wheelchair cushion Hospital bed/mattress - Has at home A fluidized (Group 3) - Has from [employer] ir Turn and reposition every 2 hours - Do not sit in wheelchair any longer than you have to--offload wound in bed as much as possible Other: - avoid sliding which causes friction as much as you can Additional Orders / Instructions Follow Nutritious Diet and Increase Protein Intake Other: - Refer to ostomy clinic in [facility] if desired Wound Treatment Wound #3 - Ischial Tuberosity Wound Laterality: Right Cleanser: Byram Ancillary Kit - 15 Day Supply (Generic) 3 x Per Week/30 Days Discharge Instructions: Use supplies as instructed; Kit contains: (15) Saline Bullets; (15) 3x3 Gauze; 15 pr Gloves Cleanser: Normal Saline 3 x Per Week/30 Days Discharge Instructions: Wash your hands with soap and water. Remove old dressing, discard into plastic bag and place into trash. Cleanse the wound with Normal Saline prior to applying a clean dressing using gauze sponges, not tissues or cotton balls. Do not scrub or use excessive force. Pat dry using gauze  sponges, not tissue or cotton balls. Cleanser: Wound Cleanser 3 x Per Week/30 Days Discharge Instructions: Wash your hands with soap and water. Remove old dressing, discard into plastic bag and place into trash. Cleanse the wound with Wound Cleanser prior to applying a clean dressing using gauze sponges, not tissues or cotton balls. Do not scrub or use excessive force. Pat dry using  gauze sponges, not tissue or cotton balls. Prim Dressing: Gauze (Generic) 3 x Per Week/30 Days ary Discharge Instructions: moistened with dakins Prim Dressing: Hydrofera Blue Ready Transfer Foam, 2.5x2.5 (in/in) (Generic) 3 x Per Week/30 Days ary Discharge Instructions: Apply Hydrofera Blue Ready to wound bed in a double layer to fill dead space Secondary Dressing: ABD Pad 5x9 (in/in) (Generic) 3 x Per Week/30 Days Discharge Instructions: Cover with ABD pad Secured With: Medipore T - 47M Medipore H Soft Cloth Surgical T ape ape, 2x2 (in/yd) (Generic) 3 x Per Week/30 Days Electronic Signature(s) Signed: 10/01/2022 12:28:22 PM By: Linton Ham MD Signed: 10/06/2022 2:03:30 PM By: Massie Kluver Entered By: Massie Kluver on 10/01/2022 10:40:11 -------------------------------------------------------------------------------- Problem List Details Patient Name: Date of Service: [name] TH, [name] 10/01/2022 10:15 A M Medical Record Number: [id number] Patient Account Number: [id number] Date of Birth/Sex: Treating RN: 1965/12/22 (57 y.o. [name] Primary Care Provider: Fulton Reek Other Clinician: Massie Kluver Referring Provider: Treating Provider/Extender: Eldridge Dace, MICHA EL Ginny Forth in Treatment: [address] [place], [place] ([id number]) [id number] Page 5 of 9 ICD-10 Encounter Code Description Active Date MDM Diagnosis L89.314 Pressure ulcer of right buttock, stage 4 01/15/2022 No Yes Q76.0 Spina bifida occulta 01/15/2022 No Yes Z93.3  Colostomy status 01/15/2022 No Yes I10 Essential (primary) hypertension 01/15/2022 No Yes I25.10 Atherosclerotic heart disease of native coronary artery without angina pectoris 01/15/2022 No Yes Inactive Problems ICD-10 Code Description Active Date Inactive Date L89.323 Pressure ulcer of left buttock, stage 3 02/05/2022 02/05/2022 Resolved Problems Electronic Signature(s) Signed: 10/01/2022 12:28:22 PM By: Linton Ham MD Entered By: Linton Ham on 10/01/2022 10:41:26 -------------------------------------------------------------------------------- Progress Note Details Patient Name: Date of Service: [name] TH, [name] 10/01/2022 10:15 A M Medical Record Number: [id number] Patient Account Number: [id number] Date of Birth/Sex: Treating RN: 17-Feb-1966 (57 y.o. [name] Primary Care Provider: Fulton Reek Other Clinician: Massie Kluver Referring Provider: Treating Provider/Extender: RO BSO N, MICHA EL Ginny Forth in Treatment: 37 Subjective History of Present Illness (HPI) 57 year old male with a history of spina bifida presenting to [place] with a history of a open wound on the left gluteal region near his upper thigh which she's had for several weeks. He was seen in the ED recently at [facility] and was put on doxycycline and was advised some local care. his past medical history is significant for spinal bifid, neurogenic bladder, kidney stones, sacral pressure sores, constipation. Past surgical history significant for partial cystectomy, ileal conduit, VP shunt removal, percutaneous nephro lithotripsy. In the remote past the patient says he's had some treatment for a ulcer on this area and was treated with a skin graft. Readmission: 10/16/2021 upon evaluation today patient appears to be doing somewhat poorly in regard to a fairly large wound noted over the right ischial tuberosity location. This is a stage IV pressure ulcer. Of note this is also positive  for osteomyelitis upon a CT scan which was performed during the time that he was admitted in the hospital on 09/19/2021. With that being said it is noted in the right inferior buttock that he has a decubitus ulcer which extends to the posterior toe inferior right ischium where there is evidence of osteomyelitis. When he was here in the office actually missed that this was positive I missed read it and thought it [place], [name] ([id number]) [id number] Page 6 of 9 said that there was no osteomyelitis. That is not the case there is in  fact osteomyelitis noted here. I actually did review his notes as well in epic. Looking to the discharge summary it appears that the patient was admitted from 09/19/2021 through 09/23/2021. He was discharged home with home health care according to the note. With that being said from what I understand his sister actually takes care of him at this point. He was also to follow-up with a primary care provider and here at the wound care center within a week. I am just now seeing him on the 13th almost a month after discharge. He does have a history of again osteomyelitis of this right hip/ischial tuberosity location. 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 The reason for his admission was actually worsening of the decubitus ulcer upon admission. He does have chronic osteomyelitis of this area. He was treated with empiric antibiotics he had acute kidney injury which improved with IV fluids he also had hypokalemia. Surgical debridement was performed by general surgery at that time and ID was consulted to assist with management according to the notes. IV antibiotics were recommended but patient declined and wanted oral antibiotics at discharge. Therefore no IV antibiotics were planned for discharge  time. This case was under the care of Dr. Steva Ready while he was in the hospital when she did discharge him with a 2-week course of doxycycline and Augmentin according to the notes. It looks like Dr. Doy Hutching office did call and leave a message for the patient to get scheduled for a follow-up visit after hospital discharge I do not see where he showed up in fact it appears that the patient has a no-show letter from Dr. Doy Hutching office noted in epic due to a appointment that was missed on 10/06/2021. Looking at the pictures from Dr. Raelene Bott notes on 09/23/2021 it does appear that the wound is a little bit better as far as the cleanliness of the surface of the wound today but nonetheless still is a very significant wound. 10/26/2021 upon evaluation today patient appears to be doing okay in regard to his wound. Again this is a wound that he did indeed have osteomyelitis and previous. With that being said I do not see any signs of a worsening infection at this time which is good news. Overall I think that we are headed in the right direction although still I think he needs more aggressive offloading. 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 I do believe this would be ideal for him to be honest. Readmission: 01-15-2022 upon evaluation today patient presents for reevaluation he was last seen on October 26, 2021. At that time unfortunately he was having issues with a significant ischial pressure ulcer on the right ischial tuberosity. Subsequently he ended up having decent response to the Dakin's moistened gauze dressing that was previously being utilized and subsequently the patient's sister who is his primary caregiver as well states that she decided just to try to manage this at home. Nonetheless unfortunately this is getting a little larger not better. He does spend a lot of the day in his chair. He does have an air mattress according what they tell me today. That was previously  ordered. With that being said I do believe that at this time things do appear to be doing much better from the standpoint of infection I do  not see any signs of overt infection. There is also no evidence of systemic infection. No fevers, chills, nausea, vomiting, or diarrhea. With that being said I do believe based on what I am seeing the wound is a bit cleaner and he possibly could be an excellent candidate for a wound VAC. 02-05-2022 upon evaluation today patient appears to be doing about the same in regard to his wound. Unfortunately he is not any better but he also really has not had the wound VAC on sufficiently. There is been some confusion with the orders part of that I think is our follow-up with the way the order was written part of it may be with how the wound VAC is being placed either way we need to see what we can do to try to improve things in this regard. We will clarify the orders today going forward for home health. Unfortunately the patient does have a new wound today which is on the opposite side. This is not good 1 was bad enough have another area to open is definitely not a good thing. Its not as deep but nonetheless is also not very shallow either. We may potentially be able to get this to the point that we could bridge the 2 together in order to wound VAC both but right now want a focus on to try to get the wound VAC going for the main wound initially we will likely use Hydrofera Blue on the new wound.Marland Kitchen 02-19-2022 upon evaluation today patient's wound appears to be doing a little bit worse in the way of maceration fortunately there is no signs of active infection locally or systemically which is great news. No fevers, chills, nausea, vomiting, or diarrhea. 03-05-2022 upon evaluation today patient unfortunately does not appear to be doing nearly as good in regard to his wounds as I would like to see. Fortunately I do not see any evidence of active infection that is obvious although I  am can obtain a wound culture to ensure that there is not anything getting worse here as far as the wound without the wound VAC on his concern. With that being said I will go ahead and get this sent in and evaluated will initiate treatment as needed going forward if necessary. With regard to the wound with the wound VAC this continues to be placed directly over the wound and there is obvious signs of deep tissue injury. The suction being here means that he is sitting on it I think this may be part of the reason why it is coming off although not 570% certain. 03-12-2022 upon evaluation today patient still has not gotten the antibiotics that were called in for him 4 days ago. He tells me that his sister just told him this morning that they were ready but that she has not had a chance to go pick them up. Nonetheless I feel like that this is really something he needs any should have been taken that not the other antibiotics which were not doing the job for him to be honest. The patient states and I completely agree that there is really no way he would have known relying on his sister to let him know obviously. Nonetheless I do believe that he needs to not be taking any other antibiotics and be on the Levaquin as soon as possible. He tells me that she said she was going to pick them up today. In regard to home health I am hopeful that they actually have been  bridging this now. I do think that the wound looks better and this is good news. With that being said I am not certain that I can really tell for sure how this was attached it was removed before he came in and the black foam left in place. I really want him to leave the wound VAC in place until he comes in so that we can monitor and make sure that this is being done correctly he voiced understanding. 03-19-2022 upon evaluation today and much happier with how things appear as far as the patient's wounds are concerned. I think he is on a much better track and  they are doing a better job at bridging although its not quite where I like to see it is still more posterior which I think the patient needs to be a little bit more lateral on his side. Either way this is something that I did marked with a small dot today for him to tell the home health nurses well so she can bridge it to that location also think the tubing should go up instead of going down to just make it less cumbersome overall for him. 04-19-2022 upon evaluation today patient appears to be doing a little better in regard to his wounds. Fortunately I do not see any signs of infection I do believe he is on the right track. The wound VAC does seem to be helping to clean the wound up a bit and bring it in from the base at work. This is good news. 05-10-2022 upon evaluation today patient appears to be doing well currently in regard to his wounds in general in fact of the smaller of the 2 wounds without the wound VAC actually appears to be doing so much better this is almost completely closed and very pleased in that regard. He has been staying off of this he tells me and shows. With that being said the wound with the wound VAC is going require some sharp debridement there is some tissue which is not quite as healthy and we are going to go ahead and continue that for probably 2 more weeks although I think we may be closer to discontinuing this as well since it has healed and quite significantly. 05-24-2022 upon evaluation today patient appears to be doing well with regard to his wound in fact this looks better than I have been expected. This actually I think is a good time to go ahead and switch away from the wound VAC based on what I am seeing. I think we go to [facility] with a bordered foam dressing. 06-21-2022 upon evaluation today patient appears to be doing well currently in regard to his wound this is measuring smaller and looks well. Fortunately there does not appear to be any signs of active  infection locally or systemically at this time which is great news. No fevers, chills, nausea, vomiting, or diarrhea. 07-29-2022 upon evaluation today patient appears to be doing well currently in regard to his wound. This is actually measuring smaller although there is a lot of hypergranulation noted. Fortunately there does not appear to be any signs of active infection locally or systemically at this time. No fevers, chills, nausea, vomiting, or diarrhea. 11/9; pressure ulcer on the right buttock in the setting of spina bifida and lower extremity paralysis. He has been using [facility] 09-02-2022 upon evaluation today patient appears to be doing well currently in regard to his wound. We have been using Hydrofera Blue this is  showing signs of improvement as far as getting smaller is concerned. There is some drainage around the edges of the wound but I think a lot of this is due to the fact he did not even have a dressing on that he came in today. [place], [name] ([id number]) [id number] Page 7 of 9 12/29; right buttock pressure area which was initially stage IV. This is in the setting of a patient with spina bifida spends most of his time in a wheelchair. I am not certain how much he is offloading this. He has been using for [facility] as a primary dressing. When he was here the last visit he was felt to have hyper granulated tissue and was treated with silver nitrate chemical cauterization Objective Constitutional Sitting or standing Blood Pressure is within target range for patient.. Pulse regular and within target range for patient.Marland Kitchen Respirations regular, non-labored and within target range.. Temperature is normal and within the target range for the patient.Marland Kitchen appears in no distress. Vitals Time Taken: 10:07 AM, Height: 60 in, Temperature: 97.2 F, Pulse: 93 bpm, Respiratory Rate: 16 breaths/min, Blood Pressure: 116/74 mmHg. General Notes: Wound exam; right  buttock. The patient has granulation however a significant amount of hypergranulation. There is no evidence of surrounding infection he does have a rim of epithelialization but per our measurements this is not advancing Integumentary (Hair, Skin) Wound #3 status is Open. Original cause of wound was Gradually Appeared. The date acquired was: 09/03/2021. The wound has been in treatment 37 weeks. The wound is located on the Right Ischial Tuberosity. The wound measures 2.5cm length x 4.5cm width x 0.7cm depth; 8.836cm^2 area and 6.185cm^3 volume. There is Fat Layer (Subcutaneous Tissue) exposed. There is a large amount of serosanguineous drainage noted. There is large (67-100%) red, pink granulation within the wound bed. There is a small (1-33%) amount of necrotic tissue within the wound bed including Eschar and Adherent Slough. Assessment Active Problems ICD-10 Pressure ulcer of right buttock, stage 4 Spina bifida occulta Colostomy status Essential (primary) hypertension Atherosclerotic heart disease of native coronary artery without angina pectoris Procedures Wound #3 Pre-procedure diagnosis of Wound #3 is a Pressure Ulcer located on the Right Ischial Tuberosity . An CHEM CAUT GRANULATION TISS procedure was performed by Ricard Dillon, MD. Post procedure Diagnosis Wound #3: Same as Pre-Procedure Notes: 4 silver nitrate sticks used Plan Follow-up Appointments: Return Appointment in 2 weeks. Nurse Visit as needed Home Health: [place]: - [facility] ADMIT to [place] for wound care. May utilize formulary equivalent dressing for wound treatment orders unless otherwise specified. Home Health Nurse may visit PRN to address patientoos wound care needs. Scheduled days for dressing changes to be completed; exception, patient has scheduled wound care visit that day. **Please direct any NON-WOUND related issues/requests for orders to patient's Primary Care Physician. **If current  dressing causes regression in wound condition, may D/C ordered dressing product/s and apply Normal Saline Moist Dressing daily until next [place] or Other MD appointment. **Notify [facility] of regression in wound condition at [phone]. Bathing/ Shower/ Hygiene: Clean wound with Normal Saline or wound cleanser. - keep dressing dry or change after shower No tub bath. Off-Loading: Gel wheelchair cushion Hospital bed/mattress - Has at home Air fluidized (Group 3) - Has from [employer] and reposition every 2 hours - Do not sit in wheelchair any longer than you have to--offload wound in bed as much as possible Other: - avoid sliding which causes friction as much as  you can Additional Orders / Instructions: Follow Nutritious Diet and Increase Protein Intake Other: - Refer to ostomy clinic in [place] [address] if desired [place], [place] ([id number]) [id number] Page 8 of 9 WOUND #3: - Ischial Tuberosity Wound Laterality: Right Cleanser: Byram Ancillary Kit - 15 Day Supply (Generic) 3 x Per Week/30 Days Discharge Instructions: Use supplies as instructed; Kit contains: (15) Saline Bullets; (15) 3x3 Gauze; 15 pr Gloves Cleanser: Normal Saline 3 x Per Week/30 Days Discharge Instructions: Wash your hands with soap and water. Remove old dressing, discard into plastic bag and place into trash. Cleanse the wound with Normal Saline prior to applying a clean dressing using gauze sponges, not tissues or cotton balls. Do not scrub or use excessive force. Pat dry using gauze sponges, not tissue or cotton balls. Cleanser: Wound Cleanser 3 x Per Week/30 Days Discharge Instructions: Wash your hands with soap and water. Remove old dressing, discard into plastic bag and place into trash. Cleanse the wound with Wound Cleanser prior to applying a clean dressing using gauze sponges, not tissues or cotton balls. Do not scrub or use excessive force. Pat dry using gauze  sponges, not tissue or cotton balls. Prim Dressing: Gauze (Generic) 3 x Per Week/30 Days ary Discharge Instructions: moistened with dakins Prim Dressing: Hydrofera Blue Ready Transfer Foam, 2.5x2.5 (in/in) (Generic) 3 x Per Week/30 Days ary Discharge Instructions: Apply Hydrofera Blue Ready to wound bed in a double layer to fill dead space Secondary Dressing: ABD Pad 5x9 (in/in) (Generic) 3 x Per Week/30 Days Discharge Instructions: Cover with ABD pad Secured With: Medipore T - 13M Medipore H Soft Cloth Surgical T ape ape, 2x2 (in/yd) (Generic) 3 x Per Week/30 Days 1. I continued with the Hydrofera Blue 2. Chemical cauterization with silver nitrate 3. He may require regular and aggressive mechanical debridement of this area attempting to get better granulation tissue and some advances in the epithelialization. Also a PCR culture might be in order 4. I am not sure how much he offloads this in fact I think it is probably inadequate 5. Notable for palpation in the center part of this wound reveals some firm gritty areas which may be subcutaneous calcification Electronic Signature(s) Signed: 10/01/2022 12:28:22 PM By: Linton Ham MD Entered By: Linton Ham on 10/01/2022 10:44:55 -------------------------------------------------------------------------------- SuperBill Details Patient Name: Date of Service: [name] TH, [name] 10/01/2022 Medical Record Number: [id number] Patient Account Number: [id number] Date of Birth/Sex: Treating RN: 03-20-1966 (57 y.o. [name] Primary Care Provider: Fulton Reek Other Clinician: Massie Kluver Referring Provider: Treating Provider/Extender: RO BSO N, MICHA EL Ginny Forth in Treatment: 77 Diagnosis Coding ICD-10 Codes Code Description L89.314 Pressure ulcer of right buttock, stage 4 Q76.0 Spina bifida occulta Z93.3 Colostomy status I10 Essential (primary) hypertension I25.10 Atherosclerotic heart disease of native  coronary artery without angina pectoris Facility Procedures : CPT4 Code: [id number] Description: 30076 - CHEM CAUT GRANULATION TISS ICD-10 Diagnosis Description L89.314 Pressure ulcer of right buttock, stage 4 Q76.0 Spina bifida occulta Modifier: Quantity: 1 Physician Procedures : CPT4 Code Description Modifier DUSAN, LIPFORD ([id number]) [id number] Page [phone] [id number] - WC PHYS CHEM CAUT GRAN TISSUE 1 ICD-10 Diagnosis Description L89.314 Pressure ulcer of right buttock, stage 4 Q76.0 Spina bifida  occulta Quantity: 9 of 9 Electronic Signature(s) Signed: 10/01/2022 12:28:22 PM By: Linton Ham MD Entered By: Linton Ham on 10/01/2022 10:45:06

## 2022-10-14 ENCOUNTER — Other Ambulatory Visit: Payer: Self-pay | Admitting: Physician Assistant

## 2022-10-14 ENCOUNTER — Ambulatory Visit
Admission: RE | Admit: 2022-10-14 | Discharge: 2022-10-14 | Disposition: A | Discharge status: Home / Self Care | Payer: 59 | Source: Ambulatory Visit | Attending: Physician Assistant | Admitting: Physician Assistant

## 2022-10-14 ENCOUNTER — Encounter: Payer: 59 | Attending: Physician Assistant | Admitting: Physician Assistant

## 2022-10-14 ENCOUNTER — Other Ambulatory Visit
Admission: RE | Admit: 2022-10-14 | Discharge: 2022-10-14 | Disposition: A | Discharge status: Home / Self Care | Payer: 59 | Source: Ambulatory Visit

## 2022-10-14 DIAGNOSIS — E876 Hypokalemia: Secondary | ICD-10-CM | POA: Insufficient documentation

## 2022-10-14 DIAGNOSIS — Q059 Spina bifida, unspecified: Secondary | ICD-10-CM | POA: Diagnosis not present

## 2022-10-14 DIAGNOSIS — M869 Osteomyelitis, unspecified: Secondary | ICD-10-CM

## 2022-10-14 DIAGNOSIS — E1169 Type 2 diabetes mellitus with other specified complication: Secondary | ICD-10-CM | POA: Insufficient documentation

## 2022-10-14 DIAGNOSIS — L89314 Pressure ulcer of right buttock, stage 4: Secondary | ICD-10-CM | POA: Diagnosis present

## 2022-10-14 DIAGNOSIS — I2510 Atherosclerotic heart disease of native coronary artery without angina pectoris: Secondary | ICD-10-CM | POA: Diagnosis not present

## 2022-10-14 DIAGNOSIS — I129 Hypertensive chronic kidney disease with stage 1 through stage 4 chronic kidney disease, or unspecified chronic kidney disease: Secondary | ICD-10-CM | POA: Insufficient documentation

## 2022-10-14 DIAGNOSIS — Q760 Spina bifida occulta: Secondary | ICD-10-CM | POA: Diagnosis not present

## 2022-10-14 DIAGNOSIS — N183 Chronic kidney disease, stage 3 unspecified: Secondary | ICD-10-CM | POA: Diagnosis not present

## 2022-10-14 DIAGNOSIS — N319 Neuromuscular dysfunction of bladder, unspecified: Secondary | ICD-10-CM | POA: Diagnosis not present

## 2022-10-14 DIAGNOSIS — Z933 Colostomy status: Secondary | ICD-10-CM | POA: Diagnosis not present

## 2022-10-14 DIAGNOSIS — G8220 Paraplegia, unspecified: Secondary | ICD-10-CM | POA: Insufficient documentation

## 2022-10-14 DIAGNOSIS — B999 Unspecified infectious disease: Secondary | ICD-10-CM | POA: Insufficient documentation

## 2022-10-14 NOTE — Progress Notes (Addendum)
[place], [name] ([id number]) [id number] Page 1 of 6 Visit Report for 10/14/2022 Arrival Information Details Patient Name: Date of Service: [name] [facility], DO [place] LD 10/14/2022 11:00 A M Medical Record Number: [id number] Patient Account Number: [id number] Date of Birth/Sex: Treating RN: 03-04-1966 (57 y.o. [name] Primary Care [place] Krotz: Fulton Reek Other Clinician: Massie Kluver Referring Jerzie Bieri: Treating Wayman Hoard/Extender: Rupert Stacks in Treatment: 19 Visit Information History Since Last Visit All ordered tests and consults were completed: No Patient Arrived: Wheel Chair Added or deleted any medications: No Arrival Time: 11:22 Any new allergies or adverse reactions: No Transfer Assistance: Transfer Board Had a fall or experienced change in No Patient Identification Verified: Yes activities of daily living that may affect Secondary Verification Process Completed: Yes risk of falls: Patient Requires Transmission-Based Precautions: No Signs or symptoms of abuse/neglect since last visito No Patient Has Alerts: Yes Hospitalized since last visit: No Patient Alerts: NOT diabetic Implantable device outside of the clinic excluding No cellular tissue based products placed in the center since last visit: Has Dressing in Place as Prescribed: Yes Pain Present Now: No Electronic Signature(s) Signed: 10/15/2022 1:23:45 PM By: Massie Kluver Entered By: Massie Kluver on 10/14/2022 11:25:46 -------------------------------------------------------------------------------- Clinic Level of Care Assessment Details Patient Name: Date of ServiceMila [name] [facility], [name] 10/14/2022 11:00 A M Medical Record Number: [id number] Patient Account Number: [id number] Date of Birth/Sex: Treating RN: Jul 04, 1966 (57 y.o. [name] Primary Care Elster Corbello: Fulton Reek Other Clinician: Massie Kluver Referring [name]: Treating [name]/Extender:  Rupert Stacks in Treatment: 38 Clinic Level of Care Assessment Items TOOL 1 Quantity Score '[]'$  - 0 Use when EandM and Procedure is performed on INITIAL visit ASSESSMENTS - Nursing Assessment / Reassessment '[]'$  - 0 General Physical Exam (combine w/ comprehensive assessment (listed just below) when performed on new pt. evals) '[]'$  - 0 Comprehensive Assessment (HX, ROS, Risk Assessments, Wounds Hx, etc.) WILBUR[place], [name] ([id number]) [id number] Page 2 of 6 ASSESSMENTS - Wound and Skin Assessment / Reassessment '[]'$  - 0 Dermatologic / Skin Assessment (not related to wound area) ASSESSMENTS - Ostomy and/or Continence Assessment and Care '[]'$  - 0 Incontinence Assessment and Management '[]'$  - 0 Ostomy Care Assessment and Management (repouching, etc.) PROCESS - Coordination of Care '[]'$  - 0 Simple Patient / Family Education for ongoing care '[]'$  - 0 Complex (extensive) Patient / Family Education for ongoing care '[]'$  - 0 Staff obtains Programmer, systems, Records, T Results / Process Orders est '[]'$  - 0 Staff telephones HHA, Nursing Homes / Clarify orders / etc '[]'$  - 0 Routine Transfer to another Facility (non-emergent condition) '[]'$  - 0 Routine Hospital Admission (non-emergent condition) '[]'$  - 0 New Admissions / Biomedical engineer / Ordering NPWT Apligraf, etc. , '[]'$  - 0 Emergency Hospital Admission (emergent condition) PROCESS - Special Needs '[]'$  - 0 Pediatric / Minor Patient Management '[]'$  - 0 Isolation Patient Management '[]'$  - 0 Hearing / Language / Visual special needs '[]'$  - 0 Assessment of Community assistance (transportation, D/C planning, etc.) '[]'$  - 0 Additional assistance / Altered mentation '[]'$  - 0 Support Surface(s) Assessment (bed, cushion, seat, etc.) INTERVENTIONS - Miscellaneous '[]'$  - 0 External ear exam '[]'$  - 0 Patient Transfer (multiple staff / Civil Service fast streamer / Similar devices) '[]'$  - 0 Simple Staple / Suture removal (25 or less) '[]'$  -  0 Complex Staple / Suture removal (26 or more) '[]'$  - 0 Hypo/Hyperglycemic Management (do not check if billed separately) '[]'$  - 0 Ankle / Brachial Index (ABI) -  do not check if billed separately Has the patient been seen at the hospital within the last three years: Yes Total Score: 0 Level Of Care: ____ Electronic Signature(s) Signed: 10/15/2022 1:23:45 PM By: Massie Kluver Entered By: Massie Kluver on 10/14/2022 12:15:07 -------------------------------------------------------------------------------- Lower Extremity Assessment Details Patient Name: Date of ServiceMila [name] [facility], [name] 10/14/2022 11:00 A M Medical Record Number: [id number] Patient Account Number: [id number] Date of Birth/Sex: Treating RN: [birth year] (57 y.o. [name] Primary Care [name]: Fulton Reek Other Clinician: Massie Kluver Referring Cheyna Retana: Treating [name]/Extender: [name] [place], [place] ([id number]) [id number] Page 3 of 6 Weeks in Treatment: 38 Electronic Signature(s) Signed: 10/14/2022 3:17:21 PM By: Gretta Cool, BSN, RN, CWS, Kim RN, BSN Signed: 10/15/2022 1:23:45 PM By: Massie Kluver Entered By: Massie Kluver on 10/14/2022 11:36:19 -------------------------------------------------------------------------------- Multi Wound Chart Details Patient Name: Date of Service: [name] TH, [name] 10/14/2022 11:00 A M Medical Record Number: [id number] Patient Account Number: [id number] Date of Birth/Sex: Treating RN: July 17, 1966 (57 y.o. [name], [name] Primary Care [name]: Fulton Reek Other Clinician: Massie Kluver Referring Everest Hacking: Treating Anasofia Micallef/Extender: Rupert Stacks in Treatment: 38 Vital Signs Height(in): 60 Pulse(bpm): 75 Weight(lbs): Blood Pressure(mmHg): 91/60 Body Mass Index(BMI): Temperature(F): 98.1 Respiratory Rate(breaths/min): 16 [3:Photos: No Photos Right Ischial Tuberosity Wound Location:  Gradually Appeared Wounding Event: Pressure Ulcer Primary Etiology: Cataracts, Glaucoma, Coronary Artery N/A Comorbid History: Disease, Hypertension, History of pressure wounds, Osteoarthritis,  Paraplegia 09/03/2021 Date Acquired: 38 Weeks of Treatment: Open Wound Status: No Wound Recurrence: 2.3x5.5x0.7 Measurements L x W x D (cm) 9.935 A (cm) : rea 6.955 Volume (cm) : 43.50% % Reduction in A rea: 84.20% % Reduction in Volume: 2 Starting  Position 1 (o'clock): 5 Ending Position 1 (o'clock): 1 Maximum Distance 1 (cm): Yes Undermining: Category/Stage IV Classification: Large Exudate A mount: Serosanguineous Exudate Type: red, brown Exudate Color: Large (67-100%) Granulation A mount: Red,  Pink Granulation Quality: Small (1-33%) Necrotic A mount: Eschar, Adherent Slough Necrotic Tissue: Fat Layer (Subcutaneous Tissue): Yes N/A Exposed Structures: None Epithelialization:] [N/A:N/A N/A N/A N/A N/A N/A N/A N/A N/A N/A N/A N/A N/A N/A N/A N/A  N/A N/A N/A N/A N/A N/A N/A] Treatment Notes Electronic Signature(s) Signed: 10/15/2022 1:23:45 PM By: Valla Leaver, [name] ([id number]) [id number] Page 4 of 6 Entered By: Massie Kluver on 10/14/2022 11:36:22 -------------------------------------------------------------------------------- Pain Assessment Details Patient Name: Date of Service: [name] [facility], [name] 10/14/2022 11:00 A M Medical Record Number: [id number] Patient Account Number: [id number] Date of Birth/Sex: Treating RN: 11/06/1965 (57 y.o. [name] Primary Care Eldana Isip: Fulton Reek Other Clinician: Massie Kluver Referring Angeles Zehner: Treating Annelise Mccoy/Extender: [name] Weeks in Treatment: 38 Active Problems Location of Pain Severity and Description of Pain Patient Has Paino No Site Locations Pain Management and Medication Current Pain Management: Electronic Signature(s) Signed: 10/14/2022 3:17:21 PM By: Gretta Cool, BSN, RN, CWS,  Kim RN, BSN Signed: 10/15/2022 1:23:45 PM By: Massie Kluver Entered By: Massie Kluver on 10/14/2022 11:28:57 -------------------------------------------------------------------------------- Wound Assessment Details Patient Name: Date of Service: [name] TH, [name] 10/14/2022 11:00 A M Medical Record Number: [id number] Patient Account Number: [id number] Date of Birth/Sex: Treating RN: September 07, 1966 (57 y.o. [name] [place], [name] ([id number]) [id number] Page 5 of 6 Primary Care Nijee Heatwole: Fulton Reek Other Clinician: Massie Kluver Referring Devante Capano: Treating Chantrell Apsey/Extender: Rupert Stacks in Treatment: 38 Wound Status Wound Number: 3 Primary Pressure Ulcer Etiology: Wound Location: Right Ischial Tuberosity Wound Open Wounding Event: Gradually  Appeared Status: Date Acquired: 09/03/2021 Comorbid Cataracts, Glaucoma, Coronary Artery Disease, Hypertension, Weeks Of Treatment: 38 History: History of pressure wounds, Osteoarthritis, Paraplegia Clustered Wound: No Photos Wound Measurements Length: (cm) 2.3 Width: (cm) 5.5 Depth: (cm) 0.7 Area: (cm) 9.935 Volume: (cm) 6.955 % Reduction in Area: 43.5% % Reduction in Volume: 84.2% Epithelialization: None Tunneling: No Undermining: Yes Starting Position (o'clock): 2 Ending Position (o'clock): 5 Maximum Distance: (cm) 1 Wound Description Classification: Category/Stage IV Exudate Amount: Large Exudate Type: Serosanguineous Exudate Color: red, brown Foul Odor After Cleansing: No Slough/Fibrino Yes Wound Bed Granulation Amount: Large (67-100%) Exposed Structure Granulation Quality: Red, Pink Fat Layer (Subcutaneous Tissue) Exposed: Yes Necrotic Amount: Small (1-33%) Necrotic Quality: Eschar, Adherent Therapist, music) Signed: 10/14/2022 3:17:21 PM By: Gretta Cool, BSN, RN, CWS, Kim RN, BSN Signed: 10/15/2022 1:23:45 PM By: Massie Kluver Entered By:  Massie Kluver on 10/14/2022 11:39:25 -------------------------------------------------------------------------------- Vitals Details Patient Name: Date of Service: [name] TH, [name] 10/14/2022 11:00 A M Medical Record Number: [id number] Patient Account Number: [id number] Date of Birth/Sex: Treating RN: August 07, 1966 (57 y.o. [name] Primary Care Mandie Crabbe: Fulton Reek Other Clinician: Massie Kluver [place], [name] ([id number]) [id number] Page 6 of 6 Referring Bonner Larue: Treating Milayah Krell/Extender: Rupert Stacks in Treatment: 38 Vital Signs Time Taken: 11:26 Temperature (F): 98.1 Height (in): 60 Pulse (bpm): 89 Respiratory Rate (breaths/min): 16 Blood Pressure (mmHg): 91/60 Reference Range: 80 - 120 mg / dl Electronic Signature(s) Signed: 10/15/2022 1:23:45 PM By: Massie Kluver Entered By: Massie Kluver on 10/14/2022 11:28:54

## 2022-10-14 NOTE — Progress Notes (Addendum)
[place], [name] ([id number]) [id number] Page 1 of 10 Visit Report for 10/14/2022 Chief Complaint Document Details Patient Name: Date of Service: [name] [facility], [name] 10/14/2022 11:00 A M Medical Record Number: [id number] Patient Account Number: [id number] Date of Birth/Sex: Treating RN: [birth year] (57 y.o. [name] Primary Care Provider: Fulton Reek Other Clinician: Massie Kluver Referring Provider: Treating Provider/Extender: [name] Weeks in Treatment: 38 Information Obtained from: Patient Chief Complaint Right ischial tuberosity pressure ulcer Electronic Signature(s) Signed: 10/14/2022 11:21:23 AM By: Worthy Keeler PA-C Entered By: Worthy Keeler on 10/14/2022 11:94:17 -------------------------------------------------------------------------------- Debridement Details Patient Name: Date of Service: [name] [facility], [name] 10/14/2022 11:00 A M Medical Record Number: [id number] Patient Account Number: [id number] Date of Birth/Sex: Treating RN: 1966/08/28 (57 y.o. [name] Primary Care Provider: Fulton Reek Other Clinician: Massie Kluver Referring Provider: Treating Provider/Extender: Rupert Stacks in Treatment: 38 Debridement Performed for Assessment: Wound #3 Right Ischial Tuberosity Performed By: Physician Tommie Sams., PA-C Debridement Type: Debridement Level of Consciousness (Pre-procedure): Awake and Alert Pre-procedure Verification/Time Out Yes - 11:59 Taken: Start Time: 11:59 T Area Debrided (L x W): otal 2.3 (cm) x 5.5 (cm) = 12.65 (cm) Tissue and other material debrided: Viable, Non-Viable, Bone, Slough, Subcutaneous, Slough Level: Skin/Subcutaneous Tissue/Muscle/Bone Debridement Description: Excisional Instrument: Curette Specimen: Tissue Culture Number of Specimens T aken: 2 Bleeding: Moderate Hemostasis Achieved: Pressure Response to Treatment: Procedure was  tolerated well Level of Consciousness (Post- Awake and Alert procedure): [name] ([id number]) [id number] Page 2 of 10 Post Debridement Measurements of Total Wound Length: (cm) 2.3 Stage: Category/Stage IV Width: (cm) 5.5 Depth: (cm) 1 Volume: (cm) 9.935 Character of Wound/Ulcer Post Debridement: Stable Post Procedure Diagnosis Same as Pre-procedure Electronic Signature(s) Signed: 10/14/2022 3:17:21 PM By: Gretta Cool, BSN, RN, CWS, Kim RN, BSN Signed: 10/14/2022 5:05:26 PM By: Worthy Keeler PA-C Signed: 10/15/2022 1:23:45 PM By: Massie Kluver Entered By: Massie Kluver on 10/14/2022 12:13:39 -------------------------------------------------------------------------------- HPI Details Patient Name: Date of Service: [name] TH, [name] 10/14/2022 11:00 A M Medical Record Number: [id number] Patient Account Number: [id number] Date of Birth/Sex: Treating RN: Jan 20, 1966 (57 y.o. [name] Primary Care Provider: Fulton Reek Other Clinician: Massie Kluver Referring Provider: Treating Provider/Extender: Rupert Stacks in Treatment: 29 History of Present Illness HPI Description: 57 year old male with a history of spina bifida presenting to [place] with a history of a open wound on the left gluteal region near his upper thigh which she's had for several weeks. He was seen in the ED recently at [facility] and was put on doxycycline and was advised some local care. his past medical history is significant for spinal bifid, neurogenic bladder, kidney stones, sacral pressure sores, constipation. Past surgical history significant for partial cystectomy, ileal conduit, VP shunt removal, percutaneous nephro lithotripsy. In the remote past the patient says he's had some treatment for a ulcer on this area and was treated with a skin graft. Readmission: 10/16/2021 upon evaluation today patient appears to be doing somewhat poorly in  regard to a fairly large wound noted over the right ischial tuberosity location. This is a stage IV pressure ulcer. Of note this is also positive for osteomyelitis upon a CT scan which was performed during the time that he was admitted in the hospital on 09/19/2021. With that being said it is noted in the right inferior buttock that he has a decubitus ulcer which extends to the posterior toe inferior right ischium  where there is evidence of osteomyelitis. When he was here in the office actually missed that this was positive I missed read it and thought it said that there was no osteomyelitis. That is not the case there is in fact osteomyelitis noted here. I actually did review his notes as well in epic. Looking to the discharge summary it appears that the patient was admitted from 09/19/2021 through 09/23/2021. He was discharged home with home health care according to the note. With that being said from what I understand his sister actually takes care of him at this point. He was also to follow-up with a primary care provider and here at the wound care center within a week. I am just now seeing him on the 13th almost a month after discharge. He does have a history of again osteomyelitis of this right hip/ischial tuberosity location. 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 The reason for his admission was actually worsening of the decubitus ulcer upon admission. He does have chronic osteomyelitis of this area. He was treated with empiric antibiotics he had acute kidney injury which improved with IV fluids he also had hypokalemia. Surgical debridement was performed by general surgery at that time and ID was consulted to assist with management according to the notes. IV antibiotics were recommended but patient declined and wanted oral antibiotics at  discharge. Therefore no IV antibiotics were planned for discharge time. This case was under the care of Dr. Steva Ready while he was in the hospital when she did discharge him with a 2-week course of doxycycline and Augmentin according to the notes. It looks like Dr. Doy Hutching office did call and leave a message for the patient to get scheduled for a follow-up visit after hospital discharge I do not see where he showed up in fact it appears that the patient has a no-show letter from Dr. Doy Hutching office noted in epic due to a appointment that was missed on 10/06/2021. Looking at the pictures from Dr. Raelene Bott notes on 09/23/2021 it does appear that the wound is a little bit better as far as the cleanliness of the surface of the wound today but nonetheless still is a very significant wound. 10/26/2021 upon evaluation today patient appears to be doing okay in regard to his wound. Again this is a wound that he did indeed have osteomyelitis and previous. With that being said I do not see any signs of a worsening infection at this time which is good news. Overall I think that we are headed in the right direction although still I think he needs more aggressive offloading. 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 I do believe this would be ideal for him to be honest. Readmission: 01-15-2022 upon evaluation today patient presents for reevaluation he was last seen on October 26, 2021. At that time unfortunately he was having issues with a significant ischial pressure ulcer on the right ischial tuberosity. Subsequently he ended up having decent response to the Dakin's moistened gauze dressing [name], [name] ([id number]) [id number] Page 3 of 10 that was previously being utilized and subsequently the patient's sister who is his primary caregiver as well states that she decided just to try to manage this at home. Nonetheless unfortunately this is getting a little  larger not better. He does spend a lot  of the day in his chair. He does have an air mattress according what they tell me today. That was previously ordered. With that being said I do believe that at this time things do appear to be doing much better from the standpoint of infection I do not see any signs of overt infection. There is also no evidence of systemic infection. No fevers, chills, nausea, vomiting, or diarrhea. With that being said I do believe based on what I am seeing the wound is a bit cleaner and he possibly could be an excellent candidate for a wound VAC. 02-05-2022 upon evaluation today patient appears to be doing about the same in regard to his wound. Unfortunately he is not any better but he also really has not had the wound VAC on sufficiently. There is been some confusion with the orders part of that I think is our follow-up with the way the order was written part of it may be with how the wound VAC is being placed either way we need to see what we can do to try to improve things in this regard. We will clarify the orders today going forward for home health. Unfortunately the patient does have a new wound today which is on the opposite side. This is not good 1 was bad enough have another area to open is definitely not a good thing. Its not as deep but nonetheless is also not very shallow either. We may potentially be able to get this to the point that we could bridge the 2 together in order to wound VAC both but right now want a focus on to try to get the wound VAC going for the main wound initially we will likely use Hydrofera Blue on the new wound.Marland Kitchen 02-19-2022 upon evaluation today patient's wound appears to be doing a little bit worse in the way of maceration fortunately there is no signs of active infection locally or systemically which is great news. No fevers, chills, nausea, vomiting, or diarrhea. 03-05-2022 upon evaluation today patient unfortunately does not appear to be doing  nearly as good in regard to his wounds as I would like to see. Fortunately I do not see any evidence of active infection that is obvious although I am can obtain a wound culture to ensure that there is not anything getting worse here as far as the wound without the wound VAC on his concern. With that being said I will go ahead and get this sent in and evaluated will initiate treatment as needed going forward if necessary. With regard to the wound with the wound VAC this continues to be placed directly over the wound and there is obvious signs of deep tissue injury. The suction being here means that he is sitting on it I think this may be part of the reason why it is coming off although not 277% certain. 03-12-2022 upon evaluation today patient still has not gotten the antibiotics that were called in for him 4 days ago. He tells me that his sister just told him this morning that they were ready but that she has not had a chance to go pick them up. Nonetheless I feel like that this is really something he needs any should have been taken that not the other antibiotics which were not doing the job for him to be honest. The patient states and I completely agree that there is really no way he would have known relying on his sister to let him know obviously. Nonetheless I  do believe that he needs to not be taking any other antibiotics and be on the Levaquin as soon as possible. He tells me that she said she was going to pick them up today. In regard to home health I am hopeful that they actually have been bridging this now. I do think that the wound looks better and this is good news. With that being said I am not certain that I can really tell for sure how this was attached it was removed before he came in and the black foam left in place. I really want him to leave the wound VAC in place until he comes in so that we can monitor and make sure that this is being done correctly he voiced understanding. 03-19-2022  upon evaluation today and much happier with how things appear as far as the patient's wounds are concerned. I think he is on a much better track and they are doing a better job at bridging although its not quite where I like to see it is still more posterior which I think the patient needs to be a little bit more lateral on his side. Either way this is something that I did marked with a small dot today for him to tell the home health nurses well so she can bridge it to that location also think the tubing should go up instead of going down to just make it less cumbersome overall for him. 04-19-2022 upon evaluation today patient appears to be doing a little better in regard to his wounds. Fortunately I do not see any signs of infection I do believe he is on the right track. The wound VAC does seem to be helping to clean the wound up a bit and bring it in from the base at work. This is good news. 05-10-2022 upon evaluation today patient appears to be doing well currently in regard to his wounds in general in fact of the smaller of the 2 wounds without the wound VAC actually appears to be doing so much better this is almost completely closed and very pleased in that regard. He has been staying off of this he tells me and shows. With that being said the wound with the wound VAC is going require some sharp debridement there is some tissue which is not quite as healthy and we are going to go ahead and continue that for probably 2 more weeks although I think we may be closer to discontinuing this as well since it has healed and quite significantly. 05-24-2022 upon evaluation today patient appears to be doing well with regard to his wound in fact this looks better than I have been expected. This actually I think is a good time to go ahead and switch away from the wound VAC based on what I am seeing. I think we go to [facility] with a bordered foam dressing. 06-21-2022 upon evaluation today patient appears to be  doing well currently in regard to his wound this is measuring smaller and looks well. Fortunately there does not appear to be any signs of active infection locally or systemically at this time which is great news. No fevers, chills, nausea, vomiting, or diarrhea. 07-29-2022 upon evaluation today patient appears to be doing well currently in regard to his wound. This is actually measuring smaller although there is a lot of hypergranulation noted. Fortunately there does not appear to be any signs of active infection locally or systemically at this time. No fevers, chills, nausea, vomiting, or  diarrhea. 11/9; pressure ulcer on the right buttock in the setting of spina bifida and lower extremity paralysis. He has been using [facility] 09-02-2022 upon evaluation today patient appears to be doing well currently in regard to his wound. We have been using Hydrofera Blue this is showing signs of improvement as far as getting smaller is concerned. There is some drainage around the edges of the wound but I think a lot of this is due to the fact he did not even have a dressing on that he came in today. 12/29; right buttock pressure area which was initially stage IV. This is in the setting of a patient with spina bifida spends most of his time in a wheelchair. I am not certain how much he is offloading this. He has been using for [facility] as a primary dressing. When he was here the last visit he was felt to have hyper granulated tissue and was treated with silver nitrate chemical cauterization 10-14-2022 upon evaluation today patient appears to be doing poorly in regard to the wound in the right ischial location. Unfortunately he tells me that the dressing seems to be falling off frequently. Unfortunately I think that this has contributed to him developing an infection which appears to be exemplified by the fact that he has bone exposed which is actually necrotic and working its way out of the wound up and  have to perform a fairly significant debridement today it does appear. I think that he needs to have these dressings ensure that they are in place at all times. Also discussed with the patient that we will get a need to do some testing in order to see if he indeed has an infection. If he does have a bone infection which I think is pretty likely to be osteomyelitis of this ischial location. I discussed that with the patient in detail today and depending on what we see him and make any adjustments in care as far as going forward is concerned. Obviously depressed antibiotic that we gave him that he tells me was somewhat irritating as far as the stomach side effects are concerned. He states he really would not want to be on that again. Either way I feel he is probably going to be placed on something once we get the results of the culture back. I am honestly concerned about the fact that he comes into the clinic without any dressings in place I am not sure how often they are really staying where they need to be as far as this wound is concerned. I actually cannot remember seeing him anytime in the past 2 months or so at least that he came in with a dressing intact. Electronic Signature(s) Signed: 10/14/2022 2:37:57 PM By: Worthy Keeler PA-C Entered By: Worthy Keeler on 10/14/2022 14:37:57 [name] ([id number]) [id number] Page 4 of 10 -------------------------------------------------------------------------------- Physical Exam Details Patient Name: Date of ServiceMila [name] [facility], [name] 10/14/2022 11:00 A M Medical Record Number: [id number] Patient Account Number: [id number] Date of Birth/Sex: Treating RN: [birth year] (57 y.o. [name] Primary Care Provider: Fulton Reek Other Clinician: Massie Kluver Referring Provider: Treating Provider/Extender: [name] Weeks in Treatment: 3 Constitutional Well-nourished and well-hydrated in  no acute distress. Respiratory normal breathing without difficulty. Psychiatric this patient is able to make decisions and demonstrates good insight into disease process. Alert and Oriented x 3. pleasant and cooperative. Notes Upon inspection patient's wound unfortunately had significant necrotic tissue including necrotic bone  which I did have to perform a fairly aggressive debridement in regard to. He tolerated this without complication postdebridement this is significantly improved. With that being said this is still a much deeper wound and again I do believe that we are probably looking at an infection here I am going to see with the results of the bone culture and pathology show before initiating therapy. He could be a candidate for hyperbaric oxygen therapy as well. Electronic Signature(s) Signed: 10/14/2022 5:06:46 PM By: Worthy Keeler PA-C Entered By: Worthy Keeler on 10/14/2022 17:06:46 -------------------------------------------------------------------------------- Physician Orders Details Patient Name: Date of Service: [name] TH, [name] 10/14/2022 11:00 A M Medical Record Number: [id number] Patient Account Number: [id number] Date of Birth/Sex: Treating RN: 01-16-1966 (57 y.o. [name] Primary Care Provider: Fulton Reek Other Clinician: Massie Kluver Referring Provider: Treating Provider/Extender: Rupert Stacks in Treatment: 68 Verbal / Phone Orders: No Diagnosis Coding ICD-10 Coding Code Description L89.314 Pressure ulcer of right buttock, stage 4 Q76.0 Spina bifida occulta Z93.3 Colostomy status I10 Essential (primary) hypertension I25.10 Atherosclerotic heart disease of native coronary artery without angina pectoris [name] ([id number]) [id number] Page 5 of 10 Follow-up Appointments Return Appointment in 1 week. Return Appointment in 2 weeks. Nurse Visit as needed Bathing/ Shower/  Hygiene Clean wound with Normal Saline or wound cleanser. - keep dressing dry or change after shower No tub bath. Off-Loading Gel wheelchair cushion Hospital bed/mattress - Has at home A fluidized (Group 3) - Has from [employer] ir Turn and reposition every 2 hours - Do not sit in wheelchair any longer than you have to--offload wound in bed as much as possible Other: - avoid sliding which causes friction as much as you can Additional Orders / Instructions Follow Nutritious Diet and Increase Protein Intake Other: - Refer to ostomy clinic in [facility] if desired Wound Treatment Wound #3 - Ischial Tuberosity Wound Laterality: Right Cleanser: Byram Ancillary Kit - 15 Day Supply (Generic) 3 x Per Week/30 Days Discharge Instructions: Use supplies as instructed; Kit contains: (15) Saline Bullets; (15) 3x3 Gauze; 15 pr Gloves Cleanser: Normal Saline 3 x Per Week/30 Days Discharge Instructions: Wash your hands with soap and water. Remove old dressing, discard into plastic bag and place into trash. Cleanse the wound with Normal Saline prior to applying a clean dressing using gauze sponges, not tissues or cotton balls. Do not scrub or use excessive force. Pat dry using gauze sponges, not tissue or cotton balls. Cleanser: Wound Cleanser 3 x Per Week/30 Days Discharge Instructions: Wash your hands with soap and water. Remove old dressing, discard into plastic bag and place into trash. Cleanse the wound with Wound Cleanser prior to applying a clean dressing using gauze sponges, not tissues or cotton balls. Do not scrub or use excessive force. Pat dry using gauze sponges, not tissue or cotton balls. Prim Dressing: Gauze (Generic) 3 x Per Week/30 Days ary Discharge Instructions: moistened with dakins Prim Dressing: Hydrofera Blue Ready Transfer Foam, 2.5x2.5 (in/in) (Generic) 3 x Per Week/30 Days ary Discharge Instructions: Apply Hydrofera Blue Ready to wound bed in a double layer to fill dead  space Secondary Dressing: Zetuvit Plus 4x4 (in/in) 3 x Per Week/30 Days Secured With: Medipore T - 53M Medipore H Soft Cloth Surgical T ape ape, 2x2 (in/yd) (Generic) 3 x Per Week/30 Days Laboratory Bacteria identified in Wound by Culture (MICRO) - culture obtained ischial tuberosity LOINC Code: 4827-0 Convenience Name: Wound culture routine Bacteria identified in Tissue by Biopsy  culture (MICRO) - tissue culture obtained LOINC Code: 20474-3 Convenience Name: Biopsy specimen culture Radiology X-ray, Pelvis - Xray ordered Electronic Signature(s) Signed: 10/22/2022 9:51:46 AM By: Massie Kluver Signed: 11/01/2022 8:47:02 PM By: Worthy Keeler PA-C Previous Signature: 10/14/2022 5:05:26 PM Version By: Worthy Keeler PA-C Previous Signature: 10/15/2022 1:23:45 PM Version By: Massie Kluver Entered By: Massie Kluver on 10/20/2022 08:37:22 [name] ([id number]) [id number] Page 6 of 10 -------------------------------------------------------------------------------- Problem List Details Patient Name: Date of ServiceMila [name] [facility], [name] 10/14/2022 11:00 A M Medical Record Number: [id number] Patient Account Number: [id number] Date of Birth/Sex: Treating RN: 04-27-1966 (57 y.o. [name], Maudie Mercury Primary Care Provider: Fulton Reek Other Clinician: Massie Kluver Referring Provider: Treating Provider/Extender: [name] Weeks in Treatment: 48 Active Problems ICD-10 Encounter Code Description Active Date MDM Diagnosis L89.314 Pressure ulcer of right buttock, stage 4 01/15/2022 No Yes Q76.0 Spina bifida occulta 01/15/2022 No Yes Z93.3 Colostomy status 01/15/2022 No Yes I10 Essential (primary) hypertension 01/15/2022 No Yes I25.10 Atherosclerotic heart disease of native coronary artery without angina pectoris 01/15/2022 No Yes Inactive Problems ICD-10 Code Description Active Date Inactive Date L89.323 Pressure ulcer of left buttock, stage  3 02/05/2022 02/05/2022 Resolved Problems Electronic Signature(s) Signed: 10/14/2022 11:21:18 AM By: Worthy Keeler PA-C Entered By: Worthy Keeler on 10/14/2022 11:21:18 [name] ([id number]) [id number] Page 7 of 10 -------------------------------------------------------------------------------- Progress Note Details Patient Name: Date of ServiceMila [name] [facility], [name] 10/14/2022 11:00 A M Medical Record Number: [id number] Patient Account Number: [id number] Date of Birth/Sex: Treating RN: 20-Feb-1966 (57 y.o. [name] Primary Care Provider: Fulton Reek Other Clinician: Massie Kluver Referring Provider: Treating Provider/Extender: Rupert Stacks in Treatment: 38 Subjective Chief Complaint Information obtained from Patient Right ischial tuberosity pressure ulcer History of Present Illness (HPI) 57 year old male with a history of spina bifida presenting to [place] with a history of a open wound on the left gluteal region near his upper thigh which she's had for several weeks. He was seen in the ED recently at [facility] and was put on doxycycline and was advised some local care. his past medical history is significant for spinal bifid, neurogenic bladder, kidney stones, sacral pressure sores, constipation. Past surgical history significant for partial cystectomy, ileal conduit, VP shunt removal, percutaneous nephro lithotripsy. In the remote past the patient says he's had some treatment for a ulcer on this area and was treated with a skin graft. Readmission: 10/16/2021 upon evaluation today patient appears to be doing somewhat poorly in regard to a fairly large wound noted over the right ischial tuberosity location. This is a stage IV pressure ulcer. Of note this is also positive for osteomyelitis upon a CT scan which was performed during the time that he was admitted in the hospital on 09/19/2021. With that being said it  is noted in the right inferior buttock that he has a decubitus ulcer which extends to the posterior toe inferior right ischium where there is evidence of osteomyelitis. When he was here in the office actually missed that this was positive I missed read it and thought it said that there was no osteomyelitis. That is not the case there is in fact osteomyelitis noted here. I actually did review his notes as well in epic. Looking to the discharge summary it appears that the patient was admitted from 09/19/2021 through 09/23/2021. He was discharged home with home health care according to the note. With that being said from what I  understand his sister actually takes care of him at this point. He was also to follow-up with a primary care provider and here at the wound care center within a week. I am just now seeing him on the 13th almost a month after discharge. He does have a history of again osteomyelitis of this right hip/ischial tuberosity location. 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 The reason for his admission was actually worsening of the decubitus ulcer upon admission. He does have chronic osteomyelitis of this area. He was treated with empiric antibiotics he had acute kidney injury which improved with IV fluids he also had hypokalemia. Surgical debridement was performed by general surgery at that time and ID was consulted to assist with management according to the notes. IV antibiotics were recommended but patient declined and wanted oral antibiotics at discharge. Therefore no IV antibiotics were planned for discharge time. This case was under the care of Dr. Steva Ready while he was in the hospital when she did discharge him with a 2-week course of doxycycline and Augmentin according to the notes. It looks like Dr. Doy Hutching office did call and  leave a message for the patient to get scheduled for a follow-up visit after hospital discharge I do not see where he showed up in fact it appears that the patient has a no-show letter from Dr. Doy Hutching office noted in epic due to a appointment that was missed on 10/06/2021. Looking at the pictures from Dr. Raelene Bott notes on 09/23/2021 it does appear that the wound is a little bit better as far as the cleanliness of the surface of the wound today but nonetheless still is a very significant wound. 10/26/2021 upon evaluation today patient appears to be doing okay in regard to his wound. Again this is a wound that he did indeed have osteomyelitis and previous. With that being said I do not see any signs of a worsening infection at this time which is good news. Overall I think that we are headed in the right direction although still I think he needs more aggressive offloading. 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 I do believe this would be ideal for him to be honest. Readmission: 01-15-2022 upon evaluation today patient presents for reevaluation he was last seen on October 26, 2021. At that time unfortunately he was having issues with a significant ischial pressure ulcer on the right ischial tuberosity. Subsequently he ended up having decent response to the Dakin's moistened gauze dressing that was previously being utilized and subsequently the patient's sister who is his primary caregiver as well states that she decided just to try to manage this at home. Nonetheless unfortunately this is getting a little larger not better. He does spend a lot of the day in his chair. He does have an air mattress according what they tell me today. That was previously ordered. With that being said I do believe that at this time things do appear to be doing much better from the standpoint of infection I do not see any signs of overt infection. There is also no evidence of systemic infection. No  fevers, chills, nausea, vomiting, or diarrhea. With that being said I do believe based on what I am seeing the wound is a bit cleaner and he possibly could be an excellent candidate  for a wound VAC. 02-05-2022 upon evaluation today patient appears to be doing about the same in regard to his wound. Unfortunately he is not any better but he also really has not had the wound VAC on sufficiently. There is been some confusion with the orders part of that I think is our follow-up with the way the order was written part of it may be with how the wound VAC is being placed either way we need to see what we can do to try to improve things in this regard. We will clarify the orders today going forward for home health. Unfortunately the patient does have a new wound today which is on the opposite side. This is not good 1 was bad enough have another area to open is definitely not a good thing. Its not as deep but nonetheless is also not very shallow either. We may potentially be able to get this to the point that we could bridge the 2 together in order to wound VAC both but right now want a focus on to try to get the wound VAC going for the main wound initially we will likely use Hydrofera Blue on the new wound.Marland Kitchen 02-19-2022 upon evaluation today patient's wound appears to be doing a little bit worse in the way of maceration fortunately there is no signs of active infection locally or systemically which is great news. No fevers, chills, nausea, vomiting, or diarrhea. 03-05-2022 upon evaluation today patient unfortunately does not appear to be doing nearly as good in regard to his wounds as I would like to see. Fortunately I do not see any evidence of active infection that is obvious although I am can obtain a wound culture to ensure that there is not anything getting worse here as far as the wound without the wound VAC on his concern. With that being said I will go ahead and get this sent in and evaluated will initiate  treatment as needed going forward if necessary. With regard to the wound with the wound VAC this continues to be placed directly over the wound and there is obvious signs of deep tissue injury. The suction being here means that he is sitting on it I think this may be part of the reason why it is coming off although not 962% certain. 03-12-2022 upon evaluation today patient still has not gotten the antibiotics that were called in for him 4 days ago. He tells me that his sister just told him this morning that they were ready but that she has not had a chance to go pick them up. Nonetheless I feel like that this is really something he needs any should have been taken that not the other antibiotics which were not doing the job for him to be honest. The patient states and I completely agree that there is really no way he would have known relying on his sister to let him know obviously. Nonetheless I do believe that he needs to not be taking any other antibiotics and be on the Levaquin as soon as possible. He tells me that she said she was going to pick them up today. In regard to home health I am hopeful that they actually have been bridging this now. I do think that the wound looks better and this is good news. With that being said I am not certain that I can really tell for sure how this was attached it was removed before he came in and the black foam left in place.  I really want him to leave the wound VAC in place until he comes in so that we can monitor and make sure that this is being done correctly he voiced understanding. [name], [name] ([id number]) [id number] Page 8 of 10 03-19-2022 upon evaluation today and much happier with how things appear as far as the patient's wounds are concerned. I think he is on a much better track and they are doing a better job at bridging although its not quite where I like to see it is still more posterior which I think the patient needs to  be a little bit more lateral on his side. Either way this is something that I did marked with a small dot today for him to tell the home health nurses well so she can bridge it to that location also think the tubing should go up instead of going down to just make it less cumbersome overall for him. 04-19-2022 upon evaluation today patient appears to be doing a little better in regard to his wounds. Fortunately I do not see any signs of infection I do believe he is on the right track. The wound VAC does seem to be helping to clean the wound up a bit and bring it in from the base at work. This is good news. 05-10-2022 upon evaluation today patient appears to be doing well currently in regard to his wounds in general in fact of the smaller of the 2 wounds without the wound VAC actually appears to be doing so much better this is almost completely closed and very pleased in that regard. He has been staying off of this he tells me and shows. With that being said the wound with the wound VAC is going require some sharp debridement there is some tissue which is not quite as healthy and we are going to go ahead and continue that for probably 2 more weeks although I think we may be closer to discontinuing this as well since it has healed and quite significantly. 05-24-2022 upon evaluation today patient appears to be doing well with regard to his wound in fact this looks better than I have been expected. This actually I think is a good time to go ahead and switch away from the wound VAC based on what I am seeing. I think we go to [facility] with a bordered foam dressing. 06-21-2022 upon evaluation today patient appears to be doing well currently in regard to his wound this is measuring smaller and looks well. Fortunately there does not appear to be any signs of active infection locally or systemically at this time which is great news. No fevers, chills, nausea, vomiting, or diarrhea. 07-29-2022 upon evaluation  today patient appears to be doing well currently in regard to his wound. This is actually measuring smaller although there is a lot of hypergranulation noted. Fortunately there does not appear to be any signs of active infection locally or systemically at this time. No fevers, chills, nausea, vomiting, or diarrhea. 11/9; pressure ulcer on the right buttock in the setting of spina bifida and lower extremity paralysis. He has been using [facility] 09-02-2022 upon evaluation today patient appears to be doing well currently in regard to his wound. We have been using Hydrofera Blue this is showing signs of improvement as far as getting smaller is concerned. There is some drainage around the edges of the wound but I think a lot of this is due to the fact he did not even have a dressing on  that he came in today. 12/29; right buttock pressure area which was initially stage IV. This is in the setting of a patient with spina bifida spends most of his time in a wheelchair. I am not certain how much he is offloading this. He has been using for [facility] as a primary dressing. When he was here the last visit he was felt to have hyper granulated tissue and was treated with silver nitrate chemical cauterization 10-14-2022 upon evaluation today patient appears to be doing poorly in regard to the wound in the right ischial location. Unfortunately he tells me that the dressing seems to be falling off frequently. Unfortunately I think that this has contributed to him developing an infection which appears to be exemplified by the fact that he has bone exposed which is actually necrotic and working its way out of the wound up and have to perform a fairly significant debridement today it does appear. I think that he needs to have these dressings ensure that they are in place at all times. Also discussed with the patient that we will get a need to do some testing in order to see if he indeed has an infection. If he  does have a bone infection which I think is pretty likely to be osteomyelitis of this ischial location. I discussed that with the patient in detail today and depending on what we see him and make any adjustments in care as far as going forward is concerned. Obviously depressed antibiotic that we gave him that he tells me was somewhat irritating as far as the stomach side effects are concerned. He states he really would not want to be on that again. Either way I feel he is probably going to be placed on something once we get the results of the culture back. I am honestly concerned about the fact that he comes into the clinic without any dressings in place I am not sure how often they are really staying where they need to be as far as this wound is concerned. I actually cannot remember seeing him anytime in the past 2 months or so at least that he came in with a dressing intact. Objective Constitutional Well-nourished and well-hydrated in no acute distress. Vitals Time Taken: 11:26 AM, Height: 60 in, Temperature: 98.1 F, Pulse: 89 bpm, Respiratory Rate: 16 breaths/min, Blood Pressure: 91/60 mmHg. Respiratory normal breathing without difficulty. Psychiatric this patient is able to make decisions and demonstrates good insight into disease process. Alert and Oriented x 3. pleasant and cooperative. General Notes: Upon inspection patient's wound unfortunately had significant necrotic tissue including necrotic bone which I did have to perform a fairly aggressive debridement in regard to. He tolerated this without complication postdebridement this is significantly improved. With that being said this is still a much deeper wound and again I do believe that we are probably looking at an infection here I am going to see with the results of the bone culture and pathology show before initiating therapy. He could be a candidate for hyperbaric oxygen therapy as well. Integumentary (Hair, Skin) Wound #3 status  is Open. Original cause of wound was Gradually Appeared. The date acquired was: 09/03/2021. The wound has been in treatment 38 weeks. The wound is located on the Right Ischial Tuberosity. The wound measures 2.3cm length x 5.5cm width x 0.7cm depth; 9.935cm^2 area and 6.955cm^3 volume. There is Fat Layer (Subcutaneous Tissue) exposed. There is no tunneling noted, however, there is undermining starting at 2:00 and ending  at 5:00 with a maximum distance of 1cm. There is a large amount of serosanguineous drainage noted. There is large (67-100%) red, pink granulation within the wound bed. There is a small (1-33%) amount of necrotic tissue within the wound bed including Eschar and Adherent Slough. Assessment [address] [name], [name] ([id number]) [id number] Page 9 of 10 ICD-10 Pressure ulcer of right buttock, stage 4 Spina bifida occulta Colostomy status Essential (primary) hypertension Atherosclerotic heart disease of native coronary artery without angina pectoris Procedures Wound #3 Pre-procedure diagnosis of Wound #3 is a Pressure Ulcer located on the Right Ischial Tuberosity . There was a Excisional Skin/Subcutaneous Tissue/Muscle/Bone Debridement with a total area of 12.65 sq cm performed by Tommie Sams., PA-C. With the following instrument(s): Curette to remove Viable and Non-Viable tissue/material. Material removed includes Bone,Subcutaneous Tissue, and Slough. 2 specimens were taken by a Tissue Culture and sent to the lab per facility protocol. A time out was conducted at 11:59, prior to the start of the procedure. A Moderate amount of bleeding was controlled with Pressure. The procedure was tolerated well. Post Debridement Measurements: 2.3cm length x 5.5cm width x 1cm depth; 9.935cm^3 volume. Post debridement Stage noted as Category/Stage IV. Character of Wound/Ulcer Post Debridement is stable. Post procedure Diagnosis Wound #3: Same as  Pre-Procedure Plan Follow-up Appointments: Return Appointment in 1 week. Return Appointment in 2 weeks. Nurse Visit as needed Bathing/ Shower/ Hygiene: Clean wound with Normal Saline or wound cleanser. - keep dressing dry or change after shower No tub bath. Off-Loading: Gel wheelchair cushion Hospital bed/mattress - Has at home Air fluidized (Group 3) - Has from [employer] and reposition every 2 hours - Do not sit in wheelchair any longer than you have to--offload wound in bed as much as possible Other: - avoid sliding which causes friction as much as you can Additional Orders / Instructions: Follow Nutritious Diet and Increase Protein Intake Other: - Refer to ostomy clinic in [place] if desired WOUND #3: - Ischial Tuberosity Wound Laterality: Right Cleanser: Byram Ancillary Kit - 15 Day Supply (Generic) 3 x Per Week/30 Days Discharge Instructions: Use supplies as instructed; Kit contains: (15) Saline Bullets; (15) 3x3 Gauze; 15 pr Gloves Cleanser: Normal Saline 3 x Per Week/30 Days Discharge Instructions: Wash your hands with soap and water. Remove old dressing, discard into plastic bag and place into trash. Cleanse the wound with Normal Saline prior to applying a clean dressing using gauze sponges, not tissues or cotton balls. Do not scrub or use excessive force. Pat dry using gauze sponges, not tissue or cotton balls. Cleanser: Wound Cleanser 3 x Per Week/30 Days Discharge Instructions: Wash your hands with soap and water. Remove old dressing, discard into plastic bag and place into trash. Cleanse the wound with Wound Cleanser prior to applying a clean dressing using gauze sponges, not tissues or cotton balls. Do not scrub or use excessive force. Pat dry using gauze sponges, not tissue or cotton balls. Prim Dressing: Gauze (Generic) 3 x Per Week/30 Days ary Discharge Instructions: moistened with dakins Prim Dressing: Hydrofera Blue Ready Transfer Foam, 2.5x2.5 (in/in) (Generic)  3 x Per Week/30 Days ary Discharge Instructions: Apply Hydrofera Blue Ready to wound bed in a double layer to fill dead space Secondary Dressing: Zetuvit Plus 4x4 (in/in) 3 x Per Week/30 Days Secured With: Medipore T - 20M Medipore H Soft Cloth Surgical T ape ape, 2x2 (in/yd) (Generic) 3 x Per Week/30 Days Electronic Signature(s) Signed: 10/14/2022 5:07:07 PM By: Worthy Keeler PA-C Entered By:  Worthy Keeler on 10/14/2022 17:07:07 [name] ([id number]) [id number] Page 10 of 10 -------------------------------------------------------------------------------- SuperBill Details Patient Name: [name] 10/14/2022 Medical Record Number: [id number] Patient Account Number: [id number] Date of Birth/Sex: Treating RN: [birth year] (57 y.o. [name] Primary Care Provider: Fulton Reek Other Clinician: Massie Kluver Referring Provider: Treating Provider/Extender: Rupert Stacks in Treatment: 38 Diagnosis Coding ICD-10 Codes Code Description L89.314 Pressure ulcer of right buttock, stage 4 Q76.0 Spina bifida occulta Z93.3 Colostomy status I10 Essential (primary) hypertension I25.10 Atherosclerotic heart disease of native coronary artery without angina pectoris Facility Procedures : CPT4 Code: [id number] Description: 29574 - DEB BONE 20 SQ CM/< ICD-10 Diagnosis Description L89.314 Pressure ulcer of right buttock, stage 4 Modifier: Quantity: 1 Physician Procedures : CPT4: Description Modifier Code [id number] Debridement; bone (includes epidermis, dermis, subQ tissue, muscle and/or fascia, if performed) 1st 20 sqcm or less ICD-10 Diagnosis Description L89.314 Pressure ulcer of right buttock, stage 4 Quantity: 1 Electronic Signature(s) Signed: 10/14/2022 5:07:28 PM By: Worthy Keeler PA-C Entered By: Worthy Keeler on 10/14/2022 17:07:28

## 2022-10-18 LAB — AEROBIC CULTURE W GRAM STAIN (SUPERFICIAL SPECIMEN): Gram Stain: NONE SEEN

## 2022-10-18 LAB — SURGICAL PATHOLOGY

## 2022-10-21 ENCOUNTER — Encounter: Payer: 59 | Admitting: Physician Assistant

## 2022-10-21 DIAGNOSIS — L89314 Pressure ulcer of right buttock, stage 4: Secondary | ICD-10-CM | POA: Diagnosis not present

## 2022-10-21 NOTE — Progress Notes (Addendum)
Strang, [name] ([id number]) [id number] Page 1 of 8 Visit Report for 10/21/2022 Arrival Information Details Patient Name: Date of Service: [name] [facility], DO [place] LD 10/21/2022 10:15 A M Medical Record Number: [id number] Patient Account Number: [id number] Date of Birth/Sex: Treating RN: [name][birth year] (57 y.o. Jerilynn Mages) Carlene Coria Primary Care Sammy Douthitt: Fulton Reek Other Clinician: Referring Kathy Wares: Treating Maressa Apollo/Extender: Rupert Stacks in Treatment: 39 Visit Information History Since Last Visit All ordered tests and consults were completed: No Patient Arrived: Wheel Chair Added or deleted any medications: No Arrival Time: 10:15 Any new allergies or adverse reactions: No Accompanied By: self Had a fall or experienced change in No Transfer Assistance: Transfer Board activities of daily living that may affect Patient Identification Verified: Yes risk of falls: Secondary Verification Process Completed: Yes Signs or symptoms of abuse/neglect since last visito No Patient Requires Transmission-Based Precautions: No Hospitalized since last visit: No Patient Has Alerts: Yes Implantable device outside of the clinic excluding No Patient Alerts: NOT diabetic cellular tissue based products placed in the center since last visit: Has Dressing in Place as Prescribed: Yes Pain Present Now: No Electronic Signature(s) Signed: 10/21/2022 10:30:16 AM By: Carlene Coria RN Entered By: Carlene Coria on 10/21/2022 10:30:16 -------------------------------------------------------------------------------- Clinic Level of Care Assessment Details Patient Name: Date of ServiceMila [name] [facility], [name] 10/21/2022 10:15 A M Medical Record Number: [id number] Patient Account Number: [id number] Date of Birth/Sex: Treating RN: [birth year] [name] (57 y.o. Jerilynn Mages) Carlene Coria Primary Care Desaree Downen: Fulton Reek Other Clinician: Referring Delon Revelo: Treating Taichi Repka/Extender: Rupert Stacks in Treatment: 39 Clinic Level of Care Assessment Items TOOL 4 Quantity Score X- 1 0 Use when only an EandM is performed on FOLLOW-UP visit ASSESSMENTS - Nursing Assessment / Reassessment X- 1 10 Reassessment of Co-morbidities (includes updates in patient status) X- 1 5 Reassessment of Adherence to Treatment Plan [name], [name] ([id number]) [id number] Page 2 of 8 ASSESSMENTS - Wound and Skin A ssessment / Reassessment X - Simple Wound Assessment / Reassessment - one wound 1 5 '[]'$  - 0 Complex Wound Assessment / Reassessment - multiple wounds '[]'$  - 0 Dermatologic / Skin Assessment (not related to wound area) ASSESSMENTS - Focused Assessment '[]'$  - 0 Circumferential Edema Measurements - multi extremities '[]'$  - 0 Nutritional Assessment / Counseling / Intervention '[]'$  - 0 Lower Extremity Assessment (monofilament, tuning fork, pulses) '[]'$  - 0 Peripheral Arterial Disease Assessment (using hand held doppler) ASSESSMENTS - Ostomy and/or Continence Assessment and Care '[]'$  - 0 Incontinence Assessment and Management '[]'$  - 0 Ostomy Care Assessment and Management (repouching, etc.) PROCESS - Coordination of Care X - Simple Patient / Family Education for ongoing care 1 15 '[]'$  - 0 Complex (extensive) Patient / Family Education for ongoing care X- 1 10 Staff obtains Programmer, systems, Records, T Results / Process Orders est '[]'$  - 0 Staff telephones HHA, Nursing Homes / Clarify orders / etc '[]'$  - 0 Routine Transfer to another Facility (non-emergent condition) '[]'$  - 0 Routine Hospital Admission (non-emergent condition) '[]'$  - 0 New Admissions / Biomedical engineer / Ordering NPWT Apligraf, etc. , '[]'$  - 0 Emergency Hospital Admission (emergent condition) X- 1 10 Simple Discharge Coordination '[]'$  - 0 Complex (extensive) Discharge Coordination PROCESS - Special Needs '[]'$  - 0 Pediatric / Minor Patient Management '[]'$  - 0 Isolation Patient  Management '[]'$  - 0 Hearing / Language / Visual special needs '[]'$  - 0 Assessment of Community assistance (transportation, D/C planning, etc.) '[]'$  - 0 Additional assistance / Altered mentation '[]'$  - 0 Support  Surface(s) Assessment (bed, cushion, seat, etc.) INTERVENTIONS - Wound Cleansing / Measurement X - Simple Wound Cleansing - one wound 1 5 '[]'$  - 0 Complex Wound Cleansing - multiple wounds X- 1 5 Wound Imaging (photographs - any number of wounds) '[]'$  - 0 Wound Tracing (instead of photographs) X- 1 5 Simple Wound Measurement - one wound '[]'$  - 0 Complex Wound Measurement - multiple wounds INTERVENTIONS - Wound Dressings X - Small Wound Dressing one or multiple wounds 1 10 '[]'$  - 0 Medium Wound Dressing one or multiple wounds '[]'$  - 0 Large Wound Dressing one or multiple wounds '[]'$  - 0 Application of Medications - topical '[]'$  - 0 Application of Medications - injection INTERVENTIONS - Miscellaneous '[]'$  - 0 External ear exam [name], [name] ([id number]) [id number] Page 3 of 8 '[]'$  - 0 Specimen Collection (cultures, biopsies, blood, body fluids, etc.) '[]'$  - 0 Specimen(s) / Culture(s) sent or taken to Lab for analysis '[]'$  - 0 Patient Transfer (multiple staff / Harrel Lemon Lift / Similar devices) '[]'$  - 0 Simple Staple / Suture removal (25 or less) '[]'$  - 0 Complex Staple / Suture removal ([name] or more) '[]'$  - 0 Hypo / Hyperglycemic Management (close monitor of Blood Glucose) '[]'$  - 0 Ankle / Brachial Index (ABI) - do not check if billed separately X- 1 5 Vital Signs Has the patient been seen at the hospital within the last three years: Yes Total Score: 85 Level Of Care: New/Established - Level 3 Electronic Signature(s) Signed: 10/22/2022 12:55:59 PM By: Carlene Coria RN Entered By: Carlene Coria on 10/21/2022 12:34:22 -------------------------------------------------------------------------------- Encounter Discharge Information Details Patient Name: Date of Service: [name] [place], [name] 10/21/2022 10:15 A M Medical Record Number: [id number] Patient Account Number: [id number] Date of Birth/Sex: Treating RN: [birth year]/[name] (57 y.o. Jerilynn Mages) Carlene Coria Primary Care Michel Hendon: Fulton Reek Other Clinician: Referring Derya Dettmann: Treating Tannisha Kennington/Extender: Rupert Stacks in Treatment: 39 Encounter Discharge Information Items Discharge Condition: Stable Ambulatory Status: Wheelchair Discharge Destination: Home Transportation: Private Auto Accompanied By: self Schedule Follow-up Appointment: Yes Clinical Summary of Care: Electronic Signature(s) Signed: 10/21/2022 12:35:02 PM By: Carlene Coria RN Entered By: Carlene Coria on 10/21/2022 12:35:02 Lower Extremity Assessment Details -------------------------------------------------------------------------------- [name] ([id number]) [id number] Page 4 of 8 Patient Name: Date of ServiceMila [name] [facility], [name] 10/21/2022 10:15 A M Medical Record Number: [id number] Patient Account Number: [id number] Date of Birth/Sex: Treating RN: [name] (57 y.o. Jerilynn Mages) Carlene Coria Primary Care Karime Scheuermann: Fulton Reek Other Clinician: Referring Dylanie Quesenberry: Treating Ivon Roedel/Extender: [name] Weeks in Treatment: 63 Electronic Signature(s) Signed: 10/21/2022 10:32:35 AM By: Carlene Coria RN Entered By: Carlene Coria on 10/21/2022 10:32:35 -------------------------------------------------------------------------------- Multi-Disciplinary Care Plan Details Patient Name: Date of Service: [name] TH, [name] 10/21/2022 10:15 A M Medical Record Number: [id number] Patient Account Number: [id number] Date of Birth/Sex: Treating RN: [birth year]/[name] (57 y.o. Jerilynn Mages) Carlene Coria Primary Care Jacori Mulrooney: Fulton Reek Other Clinician: Referring Kahlee Metivier: Treating Jevaughn Degollado/Extender: Rupert Stacks in Treatment: 39 Active Inactive Pressure Nursing  Diagnoses: Knowledge deficit related to causes and risk factors for pressure ulcer development Knowledge deficit related to management of pressures ulcers Potential for impaired tissue integrity related to pressure, friction, moisture, and shear Goals: Patient will remain free from development of additional pressure ulcers Date Initiated: 01/15/2022 Target Resolution Date: 5/[name]/2023 Goal Status: Active Patient/caregiver will verbalize risk factors for pressure ulcer development Date Initiated: 01/15/2022 Target Resolution Date: 03/05/2022 Goal Status: Active Interventions: Assess: immobility, friction, shearing, incontinence upon admission and as  needed Assess offloading mechanisms upon admission and as needed Assess potential for pressure ulcer upon admission and as needed Notes: Electronic Signature(s) Signed: 10/21/2022 10:34:32 AM By: Carlene Coria RN Entered By: Carlene Coria on 10/21/2022 10:34:32 [name] ([id number]) [id number] Page 5 of 8 -------------------------------------------------------------------------------- Pain Assessment Details Patient Name: Date of ServiceMila [name] [facility], [name] 10/21/2022 10:15 A M Medical Record Number: [id number] Patient Account Number: [id number] Date of Birth/Sex: Treating RN: [name], [name] (57 y.o. Jerilynn Mages) Carlene Coria Primary Care Ahriana Gunkel: Fulton Reek Other Clinician: Referring [name]: Treating Anishka Bushard/Extender: Rupert Stacks in Treatment: 39 Active Problems Location of Pain Severity and Description of Pain Patient Has Paino No Site Locations Pain Management and Medication Current Pain Management: Electronic Signature(s) Signed: 10/21/2022 10:30:48 AM By: Carlene Coria RN Entered By: Carlene Coria on 10/21/2022 10:30:48 -------------------------------------------------------------------------------- Patient/Caregiver Education Details Patient Name: Date of Service: [name] TH, DO  NA LD 1/18/2024andnbsp10:15 A M Medical Record Number: [id number] Patient Account Number: [id number] Date of Birth/Gender: Treating RN: [birth year][name] (56 y.o. Oval Linsey Primary Care Physician: Fulton Reek Other Clinician: Referring Physician: Treating Physician/Extender: Rupert Stacks in Treatment: [address] [place] ([id number]) [id number] Page 6 of 8 Education Assessment Education Provided To: Patient Education Topics Provided Pressure: Methods: Explain/Verbal Responses: State content correctly Wound/Skin Impairment: Methods: Explain/Verbal Responses: State content correctly Electronic Signature(s) Signed: 10/22/2022 12:55:59 PM By: Carlene Coria RN Entered By: Carlene Coria on 10/21/2022 10:34:23 -------------------------------------------------------------------------------- Wound Assessment Details Patient Name: Date of Service: [name] [place], [name] 10/21/2022 10:15 A M Medical Record Number: [id number] Patient Account Number: [id number] Date of Birth/Sex: Treating RN: [birth year], [name] (57 y.o. 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 Wound Open Wounding Event: Gradually Appeared Status: Date Acquired: 09/03/2021 Comorbid Cataracts, Glaucoma, Coronary Artery Disease, Hypertension, Weeks Of Treatment: 39 History: History of pressure wounds, Osteoarthritis, Paraplegia Clustered Wound: No Photos Wound Measurements Length: (cm) 3.5 Width: (cm) 6.2 Depth: (cm) 2 Area: (cm) 17.043 Volume: (cm) 34.086 % Reduction in Area: 3.1% % Reduction in Volume: 22.5% Epithelialization: None Tunneling: No Undermining: No Wound Description [name], [name] ([id number]) Classification: Category/Stage  IV Exudate Amount: Large Exudate Type: Serosanguineous Exudate Color: red, brown [phone] Page 7 of 8 Foul Odor After Cleansing: No Slough/Fibrino Yes Wound Bed Granulation Amount: Large (67-100%) Exposed Structure Granulation Quality: Red, Pink Fat Layer (Subcutaneous Tissue) Exposed: Yes Necrotic Amount: Small (1-33%) Muscle Exposed: Yes Necrotic Quality: Eschar, Adherent Slough Necrosis of Muscle: Yes Bone Exposed: Yes Treatment Notes Wound #3 (Ischial Tuberosity) Wound Laterality: Right Cleanser Byram Ancillary Kit - 15 Day Supply Discharge Instruction: Use supplies as instructed; Kit contains: (15) Saline Bullets; (15) 3x3 Gauze; 15 pr Gloves Normal Saline Discharge Instruction: Wash your hands with soap and water. Remove old dressing, discard into plastic bag and place into trash. Cleanse the wound with Normal Saline prior to applying a clean dressing using gauze sponges, not tissues or cotton balls. Do not scrub or use excessive force. Pat dry using gauze sponges, not tissue or cotton balls. Wound Cleanser Discharge Instruction: Wash your hands with soap and water. Remove old dressing, discard into plastic bag and place into trash. Cleanse the wound with Wound Cleanser prior to applying a clean dressing using gauze sponges, not tissues or cotton balls. Do not scrub or use excessive force. Pat dry using gauze sponges, not  tissue or cotton balls. Peri-Wound Care Topical Primary Dressing Gauze Discharge Instruction: moistened with dakins Secondary Dressing (BORDER) Zetuvit Plus SILICONE BORDER Dressing 4x4 (in/in) Discharge Instruction: Please do not put silicone bordered dressings under wraps. Use non-bordered dressing only. Secured With Compression Wrap Compression Stockings Environmental education officer) Signed: 10/21/2022 10:33:02 AM By: Carlene Coria RN Previous Signature: 10/21/2022 10:31:49 AM Version By: Carlene Coria RN Entered By:  Carlene Coria on 10/21/2022 10:33:02 -------------------------------------------------------------------------------- Vitals Details Patient Name: Date of Service: [name] TH, [name] 10/21/2022 10:15 A M Medical Record Number: [id number] Patient Account Number: [id number] Date of Birth/Sex: Treating RN: [name], [name] (56 y.o. Oval Linsey Primary Care Menashe Kafer: Fulton Reek Other Clinician: Referring Floyed Masoud: Treating Sadia Belfiore/Extender: Rupert Stacks in Treatment: [name], [name] ([id number]) [id number] Page 8 of 8 Time Taken: 10:15 Temperature (F): 97.9 Height (in): 60 Pulse (bpm): 101 Respiratory Rate (breaths/min): 18 Blood Pressure (mmHg): 92/59 Reference Range: 80 - 120 mg / dl Electronic Signature(s) Signed: 10/21/2022 10:30:42 AM By: Carlene Coria RN Entered By: Carlene Coria on 10/21/2022 10:30:42

## 2022-10-21 NOTE — Progress Notes (Addendum)
[name], Chase Rota ([id number]) [id number] Page 1 of 9 Visit Report for 10/21/2022 Chief Complaint Document Details Patient Name: Date of Service: [name] [facility], [name] 10/21/2022 10:15 A M Medical Record Number: [id number] Patient Account Number: [id number] Date of Birth/Sex: Treating RN: 05/04/1966 (56 y.o. [name]) Carlene Coria Primary Care Provider: Fulton Reek Other Clinician: Referring Provider: Treating Provider/Extender: Rupert Stacks in Treatment: 39 Information Obtained from: Patient Chief Complaint Right ischial tuberosity pressure ulcer Electronic Signature(s) Signed: 10/21/2022 9:37:14 AM By: Worthy Keeler PA-C Entered By: Worthy Keeler on 10/21/2022 09:37:14 -------------------------------------------------------------------------------- HPI Details Patient Name: Date of Service: [name] [facility], [name] 10/21/2022 10:15 A M Medical Record Number: [id number] Patient Account Number: [id number] Date of Birth/Sex: Treating RN: 03-04-1966 (56 y.o. [name] Primary Care Provider: Fulton Reek Other Clinician: Referring Provider: Treating Provider/Extender: Rupert Stacks in Treatment: 24 History of Present Illness HPI Description: 57 year old male with a history of spina bifida presenting to [place] with a history of a open wound on the left gluteal region near his upper thigh which she's had for several weeks. He was seen in the ED recently at [place] [facility] and was put on doxycycline and was advised some local care. his past medical history is significant for spinal bifid, neurogenic bladder, kidney stones, sacral pressure sores, constipation. Past surgical history significant for partial cystectomy, ileal conduit, VP shunt removal, percutaneous nephro lithotripsy. In the remote past the patient says he's had some treatment for a ulcer on this area and was treated with a skin  graft. Readmission: 10/16/2021 upon evaluation today patient appears to be doing somewhat poorly in regard to a fairly large wound noted over the right ischial tuberosity location. This is a stage IV pressure ulcer. Of note this is also positive for osteomyelitis upon a CT scan which was performed during the time that he was admitted in the hospital on 09/19/2021. With that being said it is noted in the right inferior buttock that he has a decubitus ulcer which extends to the posterior toe inferior right ischium where there is evidence of osteomyelitis. When he was here in the office actually missed that this was positive I missed read it and thought it said that there was no osteomyelitis. That is not the case there is in fact osteomyelitis noted here. I actually did review his notes as well in epic. Looking to the discharge summary it appears that the patient was admitted from 09/19/2021 through 09/23/2021. He was discharged home with home health care according to the note. With that being said from what I understand his sister actually takes care of him at this point. He was also to follow-up with a primary care provider and here at the wound care center within a week. I am just now seeing him on the 13th almost a DAYMIAN, LILL ([id number]) [id number] Page 2 of 9 month after discharge. He does have a history of again osteomyelitis of this right hip/ischial tuberosity location. 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 The reason for his admission was actually worsening of the decubitus ulcer upon admission. He does have chronic osteomyelitis of this area. He was treated with empiric antibiotics he had acute kidney injury which improved with IV fluids he also had hypokalemia. Surgical debridement was performed by general  surgery at that time and ID was consulted to assist with management according to the notes. IV antibiotics were recommended but patient declined and wanted oral antibiotics at discharge. Therefore no IV antibiotics were planned for discharge time. This case was under the care of Dr. Steva Ready while he was in the hospital when she did discharge him with a 2-week course of doxycycline and Augmentin according to the notes. It looks like Dr. Doy Hutching office did call and leave a message for the patient to get scheduled for a follow-up visit after hospital discharge I do not see where he showed up in fact it appears that the patient has a no-show letter from Dr. Doy Hutching office noted in epic due to a appointment that was missed on 10/06/2021. Looking at the pictures from Dr. Raelene Bott notes on 09/23/2021 it does appear that the wound is a little bit better as far as the cleanliness of the surface of the wound today but nonetheless still is a very significant wound. 10/26/2021 upon evaluation today patient appears to be doing okay in regard to his wound. Again this is a wound that he did indeed have osteomyelitis and previous. With that being said I do not see any signs of a worsening infection at this time which is good news. Overall I think that we are headed in the right direction although still I think he needs more aggressive offloading. 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 I do believe this would be ideal for him to be honest. Readmission: 01-15-2022 upon evaluation today patient presents for reevaluation he was last seen on October 26, 2021. At that time unfortunately he was having issues with a significant ischial pressure ulcer on the right ischial tuberosity. Subsequently he ended up having decent response to the Dakin's moistened gauze dressing that was previously being utilized and subsequently the patient's sister who is his primary caregiver as well states that she  decided just to try to manage this at home. Nonetheless unfortunately this is getting a little larger not better. He does spend a lot of the day in his chair. He does have an air mattress according what they tell me today. That was previously ordered. With that being said I do believe that at this time things do appear to be doing much better from the standpoint of infection I do not see any signs of overt infection. There is also no evidence of systemic infection. No fevers, chills, nausea, vomiting, or diarrhea. With that being said I do believe based on what I am seeing the wound is a bit cleaner and he possibly could be an excellent candidate for a wound VAC. 02-05-2022 upon evaluation today patient appears to be doing about the same in regard to his wound. Unfortunately he is not any better but he also really has not had the wound VAC on sufficiently. There is been some confusion with the orders part of that I think is our follow-up with the way the order was written part of it may be with how the wound VAC is being placed either way we need to see what we can do to try to improve things in this regard. We will clarify the orders today going forward for home health. Unfortunately the patient does have a new wound today which is on the opposite side. This is not good 1 was bad enough have another area to open is definitely not a good thing. Its not as deep but nonetheless  is also not very shallow either. We may potentially be able to get this to the point that we could bridge the 2 together in order to wound VAC both but right now want a focus on to try to get the wound VAC going for the main wound initially we will likely use Hydrofera Blue on the new wound.Marland Kitchen 02-19-2022 upon evaluation today patient's wound appears to be doing a little bit worse in the way of maceration fortunately there is no signs of active infection locally or systemically which is great news. No fevers, chills, nausea, vomiting,  or diarrhea. 03-05-2022 upon evaluation today patient unfortunately does not appear to be doing nearly as good in regard to his wounds as I would like to see. Fortunately I do not see any evidence of active infection that is obvious although I am can obtain a wound culture to ensure that there is not anything getting worse here as far as the wound without the wound VAC on his concern. With that being said I will go ahead and get this sent in and evaluated will initiate treatment as needed going forward if necessary. With regard to the wound with the wound VAC this continues to be placed directly over the wound and there is obvious signs of deep tissue injury. The suction being here means that he is sitting on it I think this may be part of the reason why it is coming off although not 903% certain. 03-12-2022 upon evaluation today patient still has not gotten the antibiotics that were called in for him 4 days ago. He tells me that his sister just told him this morning that they were ready but that she has not had a chance to go pick them up. Nonetheless I feel like that this is really something he needs any should have been taken that not the other antibiotics which were not doing the job for him to be honest. The patient states and I completely agree that there is really no way he would have known relying on his sister to let him know obviously. Nonetheless I do believe that he needs to not be taking any other antibiotics and be on the Levaquin as soon as possible. He tells me that she said she was going to pick them up today. In regard to home health I am hopeful that they actually have been bridging this now. I do think that the wound looks better and this is good news. With that being said I am not certain that I can really tell for sure how this was attached it was removed before he came in and the black foam left in place. I really want him to leave the wound VAC in place until he comes in so that we  can monitor and make sure that this is being done correctly he voiced understanding. 03-19-2022 upon evaluation today and much happier with how things appear as far as the patient's wounds are concerned. I think he is on a much better track and they are doing a better job at bridging although its not quite where I like to see it is still more posterior which I think the patient needs to be a little bit more lateral on his side. Either way this is something that I did marked with a small dot today for him to tell the home health nurses well so she can bridge it to that location also think the tubing should go up instead of going down to just  make it less cumbersome overall for him. 04-19-2022 upon evaluation today patient appears to be doing a little better in regard to his wounds. Fortunately I do not see any signs of infection I do believe he is on the right track. The wound VAC does seem to be helping to clean the wound up a bit and bring it in from the base at work. This is good news. 05-10-2022 upon evaluation today patient appears to be doing well currently in regard to his wounds in general in fact of the smaller of the 2 wounds without the wound VAC actually appears to be doing so much better this is almost completely closed and very pleased in that regard. He has been staying off of this he tells me and shows. With that being said the wound with the wound VAC is going require some sharp debridement there is some tissue which is not quite as healthy and we are going to go ahead and continue that for probably 2 more weeks although I think we may be closer to discontinuing this as well since it has healed and quite significantly. 05-24-2022 upon evaluation today patient appears to be doing well with regard to his wound in fact this looks better than I have been expected. This actually I think is a good time to go ahead and switch away from the wound VAC based on what I am seeing. I think we go to  [facility] with a bordered foam dressing. 06-21-2022 upon evaluation today patient appears to be doing well currently in regard to his wound this is measuring smaller and looks well. Fortunately there does not appear to be any signs of active infection locally or systemically at this time which is great news. No fevers, chills, nausea, vomiting, or diarrhea. 07-29-2022 upon evaluation today patient appears to be doing well currently in regard to his wound. This is actually measuring smaller although there is a lot of hypergranulation noted. Fortunately there does not appear to be any signs of active infection locally or systemically at this time. No fevers, chills, nausea, vomiting, or diarrhea. 11/9; pressure ulcer on the right buttock in the setting of spina bifida and lower extremity paralysis. He has been using [facility] 09-02-2022 upon evaluation today patient appears to be doing well currently in regard to his wound. We have been using Hydrofera Blue this is showing signs of improvement as far as getting smaller is concerned. There is some drainage around the edges of the wound but I think a lot of this is due to the fact he did not even have a dressing on that he came in today. 12/29; right buttock pressure area which was initially stage IV. This is in the setting of a patient with spina bifida spends most of his time in a wheelchair. I am not certain how much he is offloading this. He has been using for [facility] as a primary dressing. When he was here the last visit he was felt to have hyper granulated tissue and was treated with silver nitrate chemical cauterization 10-14-2022 upon evaluation today patient appears to be doing poorly in regard to the wound in the right ischial location. Unfortunately he tells me that the [place], [place] ([id number]) [id number] Page 3 of 9 dressing seems to be falling off frequently. Unfortunately I think that this  has contributed to him developing an infection which appears to be exemplified by the fact that he has bone exposed which is actually necrotic and working its  way out of the wound up and have to perform a fairly significant debridement today it does appear. I think that he needs to have these dressings ensure that they are in place at all times. Also discussed with the patient that we will get a need to do some testing in order to see if he indeed has an infection. If he does have a bone infection which I think is pretty likely to be osteomyelitis of this ischial location. I discussed that with the patient in detail today and depending on what we see him and make any adjustments in care as far as going forward is concerned. Obviously depressed antibiotic that we gave him that he tells me was somewhat irritating as far as the stomach side effects are concerned. He states he really would not want to be on that again. Either way I feel he is probably going to be placed on something once we get the results of the culture back. I am honestly concerned about the fact that he comes into the clinic without any dressings in place I am not sure how often they are really staying where they need to be as far as this wound is concerned. I actually cannot remember seeing him anytime in the past 2 months or so at least that he came in with a dressing intact. 10-21-2022 upon evaluation today patient presents for initial inspection here in our clinic concerning issues that he has been having with a wound in the sacral area. Again last time he was here I did debride away necrotic bone which was confirmed on pathology although it was not necessarily confirmed that this was secondary to osteomyelitis on the pathology report. Also the x-ray did not neither confirm nor deny the presence of osteomyelitis and his culture unfortunately showed multiple organisms with nothing predominance of this does not help to rule and tailor  down exactly what is going on here. Fortunately I do not see any signs of active infection systemically which is great news. With that being said locally I am definitely seeing some issues here. In fact this is warm to touch around the sacral area and I think that cellulitis is definitely present I just cannot pinpoint the exact organism. With that being said I did discuss with the patient as well today that I do believe that he probably needs an MRI in order to further quantify the extent of her likely osteomyelitis and this again is something that we are going to order this will need to be without contrast due to the fact that he is allergic to contrast dye. Electronic Signature(s) Signed: 10/21/2022 2:08:22 PM By: Worthy Keeler PA-C Entered By: Worthy Keeler on 10/21/2022 14:08:22 -------------------------------------------------------------------------------- Physical Exam Details Patient Name: Date of Service: [name] [facility], [name] 10/21/2022 10:15 A M Medical Record Number: [id number] Patient Account Number: [id number] Date of Birth/Sex: Treating RN: October 27, 1965 (56 y.o. [name] Primary Care Provider: Fulton Reek Other Clinician: Referring Provider: Treating Provider/Extender: [name] Weeks in Treatment: 20 Constitutional Well-nourished and well-hydrated in no acute distress. Respiratory normal breathing without difficulty. Psychiatric this patient is able to make decisions and demonstrates good insight into disease process. Alert and Oriented x 3. pleasant and cooperative. Notes Upon inspection patient's wound bed still showed signs of some necrotic debris although he is not having any discomfort per se he does have a lot of malodorous drainage coupled with the necrotic tissue which has me very concerned here.  Electronic Signature(s) Signed: 10/21/2022 2:08:40 PM By: Worthy Keeler PA-C Entered By: Worthy Keeler on 10/21/2022  14:08:40 [name] ([id number]) [id number] Page 4 of 9 -------------------------------------------------------------------------------- Physician Orders Details Patient Name: Date of Service: [name] [facility], [name] 10/21/2022 10:15 A M Medical Record Number: [id number] Patient Account Number: [id number] Date of Birth/Sex: Treating RN: January 11, 1966 (56 y.o. [name]) Carlene Coria Primary Care Provider: Fulton Reek Other Clinician: Referring Provider: Treating Provider/Extender: Rupert Stacks in Treatment: 45 Verbal / Phone Orders: No Diagnosis Coding ICD-10 Coding Code Description L89.314 Pressure ulcer of right buttock, stage 4 Q76.0 Spina bifida occulta Z93.3 Colostomy status I10 Essential (primary) hypertension I25.10 Atherosclerotic heart disease of native coronary artery without angina pectoris Follow-up Appointments Return Appointment in 1 week. Bathing/ Shower/ Hygiene Clean wound with Normal Saline or wound cleanser. - keep dressing dry or change after shower No tub bath. Anesthetic (Use 'Patient Medications' Section for Anesthetic Order Entry) Lidocaine applied to wound bed Off-Loading Gel wheelchair cushion Hospital bed/mattress - Has at home A fluidized (Group 3) - Has from [employer] ir Turn and reposition every 2 hours - Do not sit in wheelchair any longer than you have to--offload wound in bed as much as possible Other: - avoid sliding which causes friction as much as you can Additional Orders / Instructions Follow Nutritious Diet and Increase Protein Intake Wound Treatment Wound #3 - Ischial Tuberosity Wound Laterality: Right Cleanser: Byram Ancillary Kit - 15 Day Supply (Generic) 3 x Per Week/30 Days Discharge Instructions: Use supplies as instructed; Kit contains: (15) Saline Bullets; (15) 3x3 Gauze; 15 pr Gloves Cleanser: Normal Saline 3 x Per Week/30 Days Discharge Instructions: Wash your hands with soap and  water. Remove old dressing, discard into plastic bag and place into trash. Cleanse the wound with Normal Saline prior to applying a clean dressing using gauze sponges, not tissues or cotton balls. Do not scrub or use excessive force. Pat dry using gauze sponges, not tissue or cotton balls. Cleanser: Wound Cleanser 3 x Per Week/30 Days Discharge Instructions: Wash your hands with soap and water. Remove old dressing, discard into plastic bag and place into trash. Cleanse the wound with Wound Cleanser prior to applying a clean dressing using gauze sponges, not tissues or cotton balls. Do not scrub or use excessive force. Pat dry using gauze sponges, not tissue or cotton balls. Prim Dressing: Gauze (Generic) 3 x Per Week/30 Days ary Discharge Instructions: moistened with dakins Secondary Dressing: (BORDER) Zetuvit Plus SILICONE BORDER Dressing 4x4 (in/in) 3 x Per Week/30 Days Discharge Instructions: Please do not put silicone bordered dressings under wraps. Use non-bordered dressing only. Radiology MRI without Contrast right ischium - (ICD10 L89.314 - Pressure ulcer of right buttock, stage 4) Patient Medications llergies: Iodinated Contrast Media, Sulfa (Sulfonamide Antibiotics), latex, morphine A Notifications Medication Indication Start End 10/21/2022 Cipro DOSE 1 - oral 500 mg tablet - 1 tablet oral twice a day x 30 days. Do not take carafate or trazadone while on this medication 10/21/2022 doxycycline hyclate DOSE 1 - oral 100 mg capsule - 1 capsule oral twice a day x 30 days [place] ([id number]) [id number] Page 5 of 9 Electronic Signature(s) Signed: 10/21/2022 2:12:04 PM By: Worthy Keeler PA-C Previous Signature: 10/21/2022 11:00:15 AM Version By: Carlene Coria RN Entered By: Worthy Keeler on 10/21/2022 14:12:04 -------------------------------------------------------------------------------- Problem List Details Patient Name: Date of Service: [name] [facility], [name] 10/21/2022 10:15 A M Medical Record Number: [id number] Patient Account Number:  [id number] Date of Birth/Sex: Treating RN: 07/23/1966 (56 y.o. [name]) Carlene Coria Primary Care Provider: Fulton Reek Other Clinician: Referring Provider: Treating Provider/Extender: [name] Weeks in Treatment: 39 Active Problems ICD-10 Encounter Code Description Active Date MDM Diagnosis L89.314 Pressure ulcer of right buttock, stage 4 01/15/2022 No Yes Q76.0 Spina bifida occulta 01/15/2022 No Yes Z93.3 Colostomy status 01/15/2022 No Yes I10 Essential (primary) hypertension 01/15/2022 No Yes I25.10 Atherosclerotic heart disease of native coronary artery without angina pectoris 01/15/2022 No Yes Inactive Problems ICD-10 Code Description Active Date Inactive Date L89.323 Pressure ulcer of left buttock, stage 3 02/05/2022 02/05/2022 Resolved Problems Electronic Signature(s) Signed: 10/21/2022 9:37:11 AM By: Worthy Keeler PA-C Entered By: Worthy Keeler on 10/21/2022 09:37:11 [name] ([id number]) [id number] Page 6 of 9 -------------------------------------------------------------------------------- Progress Note Details Patient Name: Date of ServiceMila [name] [facility], [name] 10/21/2022 10:15 A M Medical Record Number: [id number] Patient Account Number: [id number] Date of Birth/Sex: Treating RN: 03-Nov-1965 (56 y.o. [name]) Carlene Coria Primary Care Provider: Fulton Reek Other Clinician: Referring Provider: Treating Provider/Extender: Rupert Stacks in Treatment: 39 Subjective Chief Complaint Information obtained from Patient Right ischial tuberosity pressure ulcer History of Present Illness (HPI) 57 year old male with a history of spina bifida presenting to [place] with a history of a open wound on the left gluteal region near his upper thigh which she's had for several weeks. He was seen in the ED recently at [place] [facility] and was put on doxycycline and was advised some local care. his past medical history is significant for spinal bifid, neurogenic bladder, kidney stones, sacral pressure sores, constipation. Past surgical history significant for partial cystectomy, ileal conduit, VP shunt removal, percutaneous nephro lithotripsy. In the remote past the patient says he's had some treatment for a ulcer on this area and was treated with a skin graft. Readmission: 10/16/2021 upon evaluation today patient appears to be doing somewhat poorly in regard to a fairly large wound noted over the right ischial tuberosity location. This is a stage IV pressure ulcer. Of note this is also positive for osteomyelitis upon a CT scan which was performed during the time that he was admitted in the hospital on 09/19/2021. With that being said it is noted in the right inferior buttock that he has a decubitus ulcer which extends to the posterior toe inferior right ischium where there is evidence of osteomyelitis. When he was here in the office actually missed that this was positive I missed read it and thought it said that there was no osteomyelitis. That is not the case there is in fact osteomyelitis noted here. I actually did review his notes as well in epic. Looking to the discharge summary it appears that the patient was admitted from 09/19/2021 through 09/23/2021. He was discharged home with home health care according to the note. With that being said from what I understand his sister actually takes care of him at this point. He was also to follow-up with a primary care provider and here at the wound care center within a week. I am just now seeing him on the 13th almost a month after discharge. He does have a history of again osteomyelitis of this right hip/ischial tuberosity location. 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 The reason for his admission was actually worsening  of the decubitus ulcer upon admission. He does have chronic osteomyelitis of this area. He was treated with empiric antibiotics he had acute kidney injury which improved with IV fluids he also had hypokalemia. Surgical debridement was performed by general surgery at that time and ID was consulted to assist with management according to the notes. IV antibiotics were recommended but patient declined and wanted oral antibiotics at discharge. Therefore no IV antibiotics were planned for discharge time. This case was under the care of Dr. Steva Ready while he was in the hospital when she did discharge him with a 2-week course of doxycycline and Augmentin according to the notes. It looks like Dr. Doy Hutching office did call and leave a message for the patient to get scheduled for a follow-up visit after hospital discharge I do not see where he showed up in fact it appears that the patient has a no-show letter from Dr. Doy Hutching office noted in epic due to a appointment that was missed on 10/06/2021. Looking at the pictures from Dr. Raelene Bott notes on 09/23/2021 it does appear that the wound is a little bit better as far as the cleanliness of the surface of the wound today but nonetheless still is a very significant wound. 10/26/2021 upon evaluation today patient appears to be doing okay in regard to his wound. Again this is a wound that he did indeed have osteomyelitis and previous. With that being said I do not see any signs of a worsening infection at this time which is good news. Overall I think that we are headed in the right direction although still I think he needs more aggressive offloading. 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 I do believe this would be ideal for him to be honest. Readmission: 01-15-2022 upon evaluation today patient presents for reevaluation he was last  seen on October 26, 2021. At that time unfortunately he was having issues with a significant ischial pressure ulcer on the right ischial tuberosity. Subsequently he ended up having decent response to the Dakin's moistened gauze dressing that was previously being utilized and subsequently the patient's sister who is his primary caregiver as well states that she decided just to try to manage this at home. Nonetheless unfortunately this is getting a little larger not better. He does spend a lot of the day in his chair. He does have an air mattress according what they tell me today. That was previously ordered. With that being said I do believe that at this time things do appear to be doing much better from the standpoint of infection I do not see any signs of overt infection. There is also no evidence of systemic infection. No fevers, chills, nausea, vomiting, or diarrhea. With that being said I do believe based on what I am seeing the wound is a bit cleaner and he possibly could be an excellent candidate for a wound VAC. 02-05-2022 upon evaluation today patient appears to be doing about the same in regard to his wound. Unfortunately he is not any better but he also really has not had the wound VAC on sufficiently. There is been some confusion with the orders part of that I think is our follow-up with the way the order was written part of it may be with how the wound VAC is being placed either way we need to see what we can do to try to improve things in this regard. We will clarify the orders today going forward for home health.  Unfortunately the patient does have a new wound today which is on the opposite side. This is not good 1 was bad enough have another area to open is definitely not a good thing. Its not as deep but nonetheless is also not very shallow either. We may potentially be able to get this to the point that we could bridge the 2 together in order to wound VAC both but right now want a focus  on to try to get the wound VAC going for the main wound initially we will likely use Hydrofera Blue on the new wound.Marland Kitchen 02-19-2022 upon evaluation today patient's wound appears to be doing a little bit worse in the way of maceration fortunately there is no signs of active infection locally or systemically which is great news. No fevers, chills, nausea, vomiting, or diarrhea. [place], Chase Rota ([id number]) [id number] Page 7 of 9 03-05-2022 upon evaluation today patient unfortunately does not appear to be doing nearly as good in regard to his wounds as I would like to see. Fortunately I do not see any evidence of active infection that is obvious although I am can obtain a wound culture to ensure that there is not anything getting worse here as far as the wound without the wound VAC on his concern. With that being said I will go ahead and get this sent in and evaluated will initiate treatment as needed going forward if necessary. With regard to the wound with the wound VAC this continues to be placed directly over the wound and there is obvious signs of deep tissue injury. The suction being here means that he is sitting on it I think this may be part of the reason why it is coming off although not 517% certain. 03-12-2022 upon evaluation today patient still has not gotten the antibiotics that were called in for him 4 days ago. He tells me that his sister just told him this morning that they were ready but that she has not had a chance to go pick them up. Nonetheless I feel like that this is really something he needs any should have been taken that not the other antibiotics which were not doing the job for him to be honest. The patient states and I completely agree that there is really no way he would have known relying on his sister to let him know obviously. Nonetheless I do believe that he needs to not be taking any other antibiotics and be on the Levaquin as soon as possible. He  tells me that she said she was going to pick them up today. In regard to home health I am hopeful that they actually have been bridging this now. I do think that the wound looks better and this is good news. With that being said I am not certain that I can really tell for sure how this was attached it was removed before he came in and the black foam left in place. I really want him to leave the wound VAC in place until he comes in so that we can monitor and make sure that this is being done correctly he voiced understanding. 03-19-2022 upon evaluation today and much happier with how things appear as far as the patient's wounds are concerned. I think he is on a much better track and they are doing a better job at bridging although its not quite where I like to see it is still more posterior which I think the patient needs to be a little bit more  lateral on his side. Either way this is something that I did marked with a small dot today for him to tell the home health nurses well so she can bridge it to that location also think the tubing should go up instead of going down to just make it less cumbersome overall for him. 04-19-2022 upon evaluation today patient appears to be doing a little better in regard to his wounds. Fortunately I do not see any signs of infection I do believe he is on the right track. The wound VAC does seem to be helping to clean the wound up a bit and bring it in from the base at work. This is good news. 05-10-2022 upon evaluation today patient appears to be doing well currently in regard to his wounds in general in fact of the smaller of the 2 wounds without the wound VAC actually appears to be doing so much better this is almost completely closed and very pleased in that regard. He has been staying off of this he tells me and shows. With that being said the wound with the wound VAC is going require some sharp debridement there is some tissue which is not quite as healthy and we are  going to go ahead and continue that for probably 2 more weeks although I think we may be closer to discontinuing this as well since it has healed and quite significantly. 05-24-2022 upon evaluation today patient appears to be doing well with regard to his wound in fact this looks better than I have been expected. This actually I think is a good time to go ahead and switch away from the wound VAC based on what I am seeing. I think we go to [facility] with a bordered foam dressing. 06-21-2022 upon evaluation today patient appears to be doing well currently in regard to his wound this is measuring smaller and looks well. Fortunately there does not appear to be any signs of active infection locally or systemically at this time which is great news. No fevers, chills, nausea, vomiting, or diarrhea. 07-29-2022 upon evaluation today patient appears to be doing well currently in regard to his wound. This is actually measuring smaller although there is a lot of hypergranulation noted. Fortunately there does not appear to be any signs of active infection locally or systemically at this time. No fevers, chills, nausea, vomiting, or diarrhea. 11/9; pressure ulcer on the right buttock in the setting of spina bifida and lower extremity paralysis. He has been using [facility] 09-02-2022 upon evaluation today patient appears to be doing well currently in regard to his wound. We have been using Hydrofera Blue this is showing signs of improvement as far as getting smaller is concerned. There is some drainage around the edges of the wound but I think a lot of this is due to the fact he did not even have a dressing on that he came in today. 12/29; right buttock pressure area which was initially stage IV. This is in the setting of a patient with spina bifida spends most of his time in a wheelchair. I am not certain how much he is offloading this. He has been using for [facility] as a primary dressing. When he was  here the last visit he was felt to have hyper granulated tissue and was treated with silver nitrate chemical cauterization 10-14-2022 upon evaluation today patient appears to be doing poorly in regard to the wound in the right ischial location. Unfortunately he tells me that  the dressing seems to be falling off frequently. Unfortunately I think that this has contributed to him developing an infection which appears to be exemplified by the fact that he has bone exposed which is actually necrotic and working its way out of the wound up and have to perform a fairly significant debridement today it does appear. I think that he needs to have these dressings ensure that they are in place at all times. Also discussed with the patient that we will get a need to do some testing in order to see if he indeed has an infection. If he does have a bone infection which I think is pretty likely to be osteomyelitis of this ischial location. I discussed that with the patient in detail today and depending on what we see him and make any adjustments in care as far as going forward is concerned. Obviously depressed antibiotic that we gave him that he tells me was somewhat irritating as far as the stomach side effects are concerned. He states he really would not want to be on that again. Either way I feel he is probably going to be placed on something once we get the results of the culture back. I am honestly concerned about the fact that he comes into the clinic without any dressings in place I am not sure how often they are really staying where they need to be as far as this wound is concerned. I actually cannot remember seeing him anytime in the past 2 months or so at least that he came in with a dressing intact. 10-21-2022 upon evaluation today patient presents for initial inspection here in our clinic concerning issues that he has been having with a wound in the sacral area. Again last time he was here I did debride away  necrotic bone which was confirmed on pathology although it was not necessarily confirmed that this was secondary to osteomyelitis on the pathology report. Also the x-ray did not neither confirm nor deny the presence of osteomyelitis and his culture unfortunately showed multiple organisms with nothing predominance of this does not help to rule and tailor down exactly what is going on here. Fortunately I do not see any signs of active infection systemically which is great news. With that being said locally I am definitely seeing some issues here. In fact this is warm to touch around the sacral area and I think that cellulitis is definitely present I just cannot pinpoint the exact organism. With that being said I did discuss with the patient as well today that I do believe that he probably needs an MRI in order to further quantify the extent of her likely osteomyelitis and this again is something that we are going to order this will need to be without contrast due to the fact that he is allergic to contrast dye. Objective Constitutional Well-nourished and well-hydrated in no acute distress. Vitals Time Taken: 10:15 AM, Height: 60 in, Temperature: 97.9 F, Pulse: 101 bpm, Respiratory Rate: 18 breaths/min, Blood Pressure: 92/59 mmHg. Respiratory normal breathing without difficulty. Psychiatric NABEEL, GLADSON ([id number]) [id number] Page 8 of 9 this patient is able to make decisions and demonstrates good insight into disease process. Alert and Oriented x 3. pleasant and cooperative. General Notes: Upon inspection patient's wound bed still showed signs of some necrotic debris although he is not having any discomfort per se he does have a lot of malodorous drainage coupled with the necrotic tissue which has me very concerned here. Integumentary (Hair, Skin) Wound #  3 status is Open. Original cause of wound was Gradually Appeared. The date acquired was: 09/03/2021. The wound  has been in treatment 39 weeks. The wound is located on the Right Ischial Tuberosity. The wound measures 3.5cm length x 6.2cm width x 2cm depth; 17.043cm^2 area and 34.086cm^3 volume. There is bone, muscle, and Fat Layer (Subcutaneous Tissue) exposed. There is no tunneling or undermining noted. There is a large amount of serosanguineous drainage noted. There is large (67-100%) red, pink granulation within the wound bed. There is a small (1-33%) amount of necrotic tissue within the wound bed including Eschar, Adherent Slough and Necrosis of Muscle. Assessment Active Problems ICD-10 Pressure ulcer of right buttock, stage 4 Spina bifida occulta Colostomy status Essential (primary) hypertension Atherosclerotic heart disease of native coronary artery without angina pectoris Plan Follow-up Appointments: Return Appointment in 1 week. Bathing/ Shower/ Hygiene: Clean wound with Normal Saline or wound cleanser. - keep dressing dry or change after shower No tub bath. Anesthetic (Use 'Patient Medications' Section for Anesthetic Order Entry): Lidocaine applied to wound bed Off-Loading: Gel wheelchair cushion Hospital bed/mattress - Has at home Air fluidized (Group 3) - Has from [place] and reposition every 2 hours - Do not sit in wheelchair any longer than you have to--offload wound in bed as much as possible Other: - avoid sliding which causes friction as much as you can Additional Orders / Instructions: Follow Nutritious Diet and Increase Protein Intake Radiology ordered were: MRI without Contrast right ischium The following medication(s) was prescribed: Cipro oral 500 mg tablet 1 1 tablet oral twice a day x 30 days. Do not take carafate or trazadone while on this medication starting 10/21/2022 doxycycline hyclate oral 100 mg capsule 1 1 capsule oral twice a day x 30 days starting 10/21/2022 WOUND #3: - Ischial Tuberosity Wound Laterality: Right Cleanser: Byram Ancillary Kit - 15 Day Supply  (Generic) 3 x Per Week/30 Days Discharge Instructions: Use supplies as instructed; Kit contains: (15) Saline Bullets; (15) 3x3 Gauze; 15 pr Gloves Cleanser: Normal Saline 3 x Per Week/30 Days Discharge Instructions: Wash your hands with soap and water. Remove old dressing, discard into plastic bag and place into trash. Cleanse the wound with Normal Saline prior to applying a clean dressing using gauze sponges, not tissues or cotton balls. Do not scrub or use excessive force. Pat dry using gauze sponges, not tissue or cotton balls. Cleanser: Wound Cleanser 3 x Per Week/30 Days Discharge Instructions: Wash your hands with soap and water. Remove old dressing, discard into plastic bag and place into trash. Cleanse the wound with Wound Cleanser prior to applying a clean dressing using gauze sponges, not tissues or cotton balls. Do not scrub or use excessive force. Pat dry using gauze sponges, not tissue or cotton balls. Prim Dressing: Gauze (Generic) 3 x Per Week/30 Days ary Discharge Instructions: moistened with dakins Secondary Dressing: (BORDER) Zetuvit Plus SILICONE BORDER Dressing 4x4 (in/in) 3 x Per Week/30 Days Discharge Instructions: Please do not put silicone bordered dressings under wraps. Use non-bordered dressing only. 1. Based on what I am seeing I do believe that the patient likely needs to have oral antibiotics started and since I do not have an exact organism with which to target treatment we will get have to go more broad-spectrum. I am actually going to suggest a combination of Cipro which the patient tells me has taken before without complication along with doxycycline. This should cover for a broad number of gram-positive and gram-negative organisms. 2. I am  also can recommend that the patient should continue to offload is much as possible to try to keep pressure off the area. 3. I would recommend initiation of treatment with a Dakin's moistened gauze dressing which I think would be  beneficial for him as well. The patient voiced understanding. We will see patient back for reevaluation in 1 week here in the clinic. If anything worsens or changes patient will contact our office for additional recommendations. Electronic Signature(s) Signed: 10/21/2022 2:12:45 PM By: Worthy Keeler PA-C Entered By: Worthy Keeler on 10/21/2022 14:12:45 [name] ([id number]) [id number] Page 9 of 9 -------------------------------------------------------------------------------- SuperBill Details Patient Name: Date of ServiceMila [name] 10/21/2022 Medical Record Number: [id number] Patient Account Number: [id number] Date of Birth/Sex: Treating RN: 19-Jun-1966 (56 y.o. [name]) Carlene Coria Primary Care Provider: Fulton Reek Other Clinician: Referring Provider: Treating Provider/Extender: Rupert Stacks in Treatment: 39 Diagnosis Coding ICD-10 Codes Code Description L89.314 Pressure ulcer of right buttock, stage 4 Q76.0 Spina bifida occulta Z93.3 Colostomy status I10 Essential (primary) hypertension I25.10 Atherosclerotic heart disease of native coronary artery without angina pectoris Facility Procedures : CPT4 Code: [id number] Description: 99213 - WOUND CARE VISIT-LEV 3 EST PT Modifier: Quantity: 1 Physician Procedures : CPT4 Code Description Modifier [id number] - WC PHYS LEVEL 4 - EST PT ICD-10 Diagnosis Description L89.314 Pressure ulcer of right buttock, stage 4 Q76.0 Spina bifida occulta Z93.3 Colostomy status I10 Essential (primary) hypertension Quantity: 1 Electronic Signature(s) Signed: 10/21/2022 2:13:07 PM By: Worthy Keeler PA-C Previous Signature: 10/21/2022 12:34:27 PM Version By: Carlene Coria RN Entered By: Worthy Keeler on 10/21/2022 14:13:06

## 2022-10-22 ENCOUNTER — Other Ambulatory Visit: Payer: Self-pay | Admitting: Physician Assistant

## 2022-10-22 DIAGNOSIS — L89314 Pressure ulcer of right buttock, stage 4: Secondary | ICD-10-CM

## 2022-10-25 ENCOUNTER — Ambulatory Visit: Payer: 59

## 2022-10-28 ENCOUNTER — Ambulatory Visit: Payer: 59 | Admitting: Internal Medicine

## 2022-11-01 ENCOUNTER — Ambulatory Visit
Admission: RE | Admit: 2022-11-01 | Discharge: 2022-11-01 | Disposition: A | Discharge status: Home / Self Care | Payer: 59 | Source: Ambulatory Visit | Attending: Physician Assistant | Admitting: Physician Assistant

## 2022-11-01 DIAGNOSIS — L89314 Pressure ulcer of right buttock, stage 4: Secondary | ICD-10-CM | POA: Diagnosis not present

## 2022-11-18 ENCOUNTER — Encounter: Payer: 59 | Attending: Physician Assistant | Admitting: Physician Assistant

## 2022-11-18 DIAGNOSIS — L89314 Pressure ulcer of right buttock, stage 4: Secondary | ICD-10-CM | POA: Insufficient documentation

## 2022-11-18 DIAGNOSIS — Z933 Colostomy status: Secondary | ICD-10-CM | POA: Diagnosis not present

## 2022-11-18 DIAGNOSIS — I2510 Atherosclerotic heart disease of native coronary artery without angina pectoris: Secondary | ICD-10-CM | POA: Diagnosis not present

## 2022-11-18 DIAGNOSIS — I129 Hypertensive chronic kidney disease with stage 1 through stage 4 chronic kidney disease, or unspecified chronic kidney disease: Secondary | ICD-10-CM | POA: Insufficient documentation

## 2022-11-18 DIAGNOSIS — N183 Chronic kidney disease, stage 3 unspecified: Secondary | ICD-10-CM | POA: Insufficient documentation

## 2022-11-18 DIAGNOSIS — Q760 Spina bifida occulta: Secondary | ICD-10-CM | POA: Diagnosis not present

## 2022-11-18 DIAGNOSIS — N319 Neuromuscular dysfunction of bladder, unspecified: Secondary | ICD-10-CM | POA: Insufficient documentation

## 2022-11-19 NOTE — Progress Notes (Signed)
Montrose, [name] ([id number]) [id number] Page 1 of 9 Visit Report for 11/18/2022 Chief Complaint Document Details Patient Name: Date of Service: [name] [facility], [name] 11/18/2022 3:15 PM Medical Record Number: [id number] Patient Account Number: [id number] Date of Birth/Sex: Treating RN: May 01, 1966 (57 y.o. [name] Primary Care Provider: Fulton Reek Other Clinician: Referring Provider: Treating Provider/Extender: [name] Weeks in Treatment: 72 Information Obtained from: Patient Chief Complaint Right ischial tuberosity pressure ulcer Electronic Signature(s) Signed: 11/18/2022 4:03:41 PM By: Worthy Keeler PA-C Entered By: Worthy Keeler on 11/18/2022 16:03:41 -------------------------------------------------------------------------------- HPI Details Patient Name: Date of Service: [name] [facility], [name] 11/18/2022 3:15 PM Medical Record Number: [id number] Patient Account Number: [id number] Date of Birth/Sex: Treating RN: 03/10/1966 (57 y.o. [name] Primary Care Provider: Fulton Reek Other Clinician: Referring Provider: Treating Provider/Extender: Rupert Stacks in Treatment: 14 History of Present Illness HPI Description: 57 year old male with a history of spina bifida presenting to [place] with a history of a open wound on the left gluteal region near his upper thigh which she's had for several weeks. He was seen in the ED recently at [place] [facility] and was put on doxycycline and was advised some local care. his past medical history is significant for spinal bifid, neurogenic bladder, kidney stones, sacral pressure sores, constipation. Past surgical history significant for partial cystectomy, ileal conduit, VP shunt removal, percutaneous nephro lithotripsy. In the remote past the patient says he's had some treatment for a ulcer on this area and was treated with a skin  graft. Readmission: 10/16/2021 upon evaluation today patient appears to be doing somewhat poorly in regard to a fairly large wound noted over the right ischial tuberosity location. This is a stage IV pressure ulcer. Of note this is also positive for osteomyelitis upon a CT scan which was performed during the time that he was admitted in the hospital on 09/19/2021. With that being said it is noted in the right inferior buttock that he has a decubitus ulcer which extends to the posterior toe inferior right ischium where there is evidence of osteomyelitis. When he was here in the office actually missed that this was positive I missed read it and thought it said that there was no osteomyelitis. That is not the case there is in fact osteomyelitis noted here. I actually did review his notes as well in epic. Looking to the discharge summary it appears that the patient was admitted from 09/19/2021 through 09/23/2021. He was discharged home with home health care according to the note. With that being said from what I understand his sister actually takes care of him at this point. He was also to follow-up with a primary care provider and here at the wound care center within a week. I am just now seeing him on the 13th almost a CORVELL, SOHMER ([id number]) [id number] Page 2 of 9 month after discharge. He does have a history of again osteomyelitis of this right hip/ischial tuberosity location. 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 The reason for his admission was actually worsening of the decubitus ulcer upon admission. He does have chronic osteomyelitis of this area. He was treated with empiric antibiotics he had acute kidney injury which improved with IV fluids he also had hypokalemia. Surgical debridement was performed by general  surgery  at that time and ID was consulted to assist with management according to the notes. IV antibiotics were recommended but patient declined and wanted oral antibiotics at discharge. Therefore no IV antibiotics were planned for discharge time. This case was under the care of Dr. Steva Ready while he was in the hospital when she did discharge him with a 2-week course of doxycycline and Augmentin according to the notes. It looks like Dr. Doy Hutching office did call and leave a message for the patient to get scheduled for a follow-up visit after hospital discharge I do not see where he showed up in fact it appears that the patient has a no-show letter from Dr. Doy Hutching office noted in epic due to a appointment that was missed on 10/06/2021. Looking at the pictures from Dr. Raelene Bott notes on 09/23/2021 it does appear that the wound is a little bit better as far as the cleanliness of the surface of the wound today but nonetheless still is a very significant wound. 10/26/2021 upon evaluation today patient appears to be doing okay in regard to his wound. Again this is a wound that he did indeed have osteomyelitis and previous. With that being said I do not see any signs of a worsening infection at this time which is good news. Overall I think that we are headed in the right direction although still I think he needs more aggressive offloading. 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 I do believe this would be ideal for him to be honest. Readmission: 01-15-2022 upon evaluation today patient presents for reevaluation he was last seen on October 26, 2021. At that time unfortunately he was having issues with a significant ischial pressure ulcer on the right ischial tuberosity. Subsequently he ended up having decent response to the Dakin's moistened gauze dressing that was previously being utilized and subsequently the patient's sister who is his primary caregiver as well states that she  decided just to try to manage this at home. Nonetheless unfortunately this is getting a little larger not better. He does spend a lot of the day in his chair. He does have an air mattress according what they tell me today. That was previously ordered. With that being said I do believe that at this time things do appear to be doing much better from the standpoint of infection I do not see any signs of overt infection. There is also no evidence of systemic infection. No fevers, chills, nausea, vomiting, or diarrhea. With that being said I do believe based on what I am seeing the wound is a bit cleaner and he possibly could be an excellent candidate for a wound VAC. 02-05-2022 upon evaluation today patient appears to be doing about the same in regard to his wound. Unfortunately he is not any better but he also really has not had the wound VAC on sufficiently. There is been some confusion with the orders part of that I think is our follow-up with the way the order was written part of it may be with how the wound VAC is being placed either way we need to see what we can do to try to improve things in this regard. We will clarify the orders today going forward for home health. Unfortunately the patient does have a new wound today which is on the opposite side. This is not good 1 was bad enough have another area to open is definitely not a good thing. Its not as deep but nonetheless is  also not very shallow either. We may potentially be able to get this to the point that we could bridge the 2 together in order to wound VAC both but right now want a focus on to try to get the wound VAC going for the main wound initially we will likely use Hydrofera Blue on the new wound.Marland Kitchen 02-19-2022 upon evaluation today patient's wound appears to be doing a little bit worse in the way of maceration fortunately there is no signs of active infection locally or systemically which is great news. No fevers, chills, nausea, vomiting,  or diarrhea. 03-05-2022 upon evaluation today patient unfortunately does not appear to be doing nearly as good in regard to his wounds as I would like to see. Fortunately I do not see any evidence of active infection that is obvious although I am can obtain a wound culture to ensure that there is not anything getting worse here as far as the wound without the wound VAC on his concern. With that being said I will go ahead and get this sent in and evaluated will initiate treatment as needed going forward if necessary. With regard to the wound with the wound VAC this continues to be placed directly over the wound and there is obvious signs of deep tissue injury. The suction being here means that he is sitting on it I think this may be part of the reason why it is coming off although not [id number] certain. 03-12-2022 upon evaluation today patient still has not gotten the antibiotics that were called in for him 4 days ago. He tells me that his sister just told him this morning that they were ready but that she has not had a chance to go pick them up. Nonetheless I feel like that this is really something he needs any should have been taken that not the other antibiotics which were not doing the job for him to be honest. The patient states and I completely agree that there is really no way he would have known relying on his sister to let him know obviously. Nonetheless I do believe that he needs to not be taking any other antibiotics and be on the Levaquin as soon as possible. He tells me that she said she was going to pick them up today. In regard to home health I am hopeful that they actually have been bridging this now. I do think that the wound looks better and this is good news. With that being said I am not certain that I can really tell for sure how this was attached it was removed before he came in and the black foam left in place. I really want him to leave the wound VAC in place until he comes in so that we  can monitor and make sure that this is being done correctly he voiced understanding. 03-19-2022 upon evaluation today and much happier with how things appear as far as the patient's wounds are concerned. I think he is on a much better track and they are doing a better job at bridging although its not quite where I like to see it is still more posterior which I think the patient needs to be a little bit more lateral on his side. Either way this is something that I did marked with a small dot today for him to tell the home health nurses well so she can bridge it to that location also think the tubing should go up instead of going down to just make  it less cumbersome overall for him. 04-19-2022 upon evaluation today patient appears to be doing a little better in regard to his wounds. Fortunately I do not see any signs of infection I do believe he is on the right track. The wound VAC does seem to be helping to clean the wound up a bit and bring it in from the base at work. This is good news. 05-10-2022 upon evaluation today patient appears to be doing well currently in regard to his wounds in general in fact of the smaller of the 2 wounds without the wound VAC actually appears to be doing so much better this is almost completely closed and very pleased in that regard. He has been staying off of this he tells me and shows. With that being said the wound with the wound VAC is going require some sharp debridement there is some tissue which is not quite as healthy and we are going to go ahead and continue that for probably 2 more weeks although I think we may be closer to discontinuing this as well since it has healed and quite significantly. 05-24-2022 upon evaluation today patient appears to be doing well with regard to his wound in fact this looks better than I have been expected. This actually I think is a good time to go ahead and switch away from the wound VAC based on what I am seeing. I think we go to  [facility] with a bordered foam dressing. 06-21-2022 upon evaluation today patient appears to be doing well currently in regard to his wound this is measuring smaller and looks well. Fortunately there does not appear to be any signs of active infection locally or systemically at this time which is great news. No fevers, chills, nausea, vomiting, or diarrhea. 07-29-2022 upon evaluation today patient appears to be doing well currently in regard to his wound. This is actually measuring smaller although there is a lot of hypergranulation noted. Fortunately there does not appear to be any signs of active infection locally or systemically at this time. No fevers, chills, nausea, vomiting, or diarrhea. 11/9; pressure ulcer on the right buttock in the setting of spina bifida and lower extremity paralysis. He has been using [facility] 09-02-2022 upon evaluation today patient appears to be doing well currently in regard to his wound. We have been using Hydrofera Blue this is showing signs of improvement as far as getting smaller is concerned. There is some drainage around the edges of the wound but I think a lot of this is due to the fact he did not even have a dressing on that he came in today. 12/29; right buttock pressure area which was initially stage IV. This is in the setting of a patient with spina bifida spends most of his time in a wheelchair. I am not certain how much he is offloading this. He has been using for [facility] as a primary dressing. When he was here the last visit he was felt to have hyper granulated tissue and was treated with silver nitrate chemical cauterization 10-14-2022 upon evaluation today patient appears to be doing poorly in regard to the wound in the right ischial location. Unfortunately he tells me that the [place], [place] ([id number]) [id number] Page 3 of 9 dressing seems to be falling off frequently. Unfortunately I think that this  has contributed to him developing an infection which appears to be exemplified by the fact that he has bone exposed which is actually necrotic and working its way  out of the wound up and have to perform a fairly significant debridement today it does appear. I think that he needs to have these dressings ensure that they are in place at all times. Also discussed with the patient that we will get a need to do some testing in order to see if he indeed has an infection. If he does have a bone infection which I think is pretty likely to be osteomyelitis of this ischial location. I discussed that with the patient in detail today and depending on what we see him and make any adjustments in care as far as going forward is concerned. Obviously depressed antibiotic that we gave him that he tells me was somewhat irritating as far as the stomach side effects are concerned. He states he really would not want to be on that again. Either way I feel he is probably going to be placed on something once we get the results of the culture back. I am honestly concerned about the fact that he comes into the clinic without any dressings in place I am not sure how often they are really staying where they need to be as far as this wound is concerned. I actually cannot remember seeing him anytime in the past 2 months or so at least that he came in with a dressing intact. 10-21-2022 upon evaluation today patient presents for initial inspection here in our clinic concerning issues that he has been having with a wound in the sacral area. Again last time he was here I did debride away necrotic bone which was confirmed on pathology although it was not necessarily confirmed that this was secondary to osteomyelitis on the pathology report. Also the x-ray did not neither confirm nor deny the presence of osteomyelitis and his culture unfortunately showed multiple organisms with nothing predominance of this does not help to rule and tailor  down exactly what is going on here. Fortunately I do not see any signs of active infection systemically which is great news. With that being said locally I am definitely seeing some issues here. In fact this is warm to touch around the sacral area and I think that cellulitis is definitely present I just cannot pinpoint the exact organism. With that being said I did discuss with the patient as well today that I do believe that he probably needs an MRI in order to further quantify the extent of her likely osteomyelitis and this again is something that we are going to order this will need to be without contrast due to the fact that he is allergic to contrast dye. 11-18-2022 upon evaluation today patient's wound is actually showing some signs of improvement currently. We have been doing the Dakin's moistened gauze dressing which I think is helping to clean up the wound for seeing some granulation growing and still the tissue is not quite as healthy as I would love to see but better than previous. He does have osteomyelitis but the good news is he seems to be treated well with the antibiotics he does have a refill that was just picked up and I think that he needs to continue to take that for certain. Electronic Signature(s) Signed: 11/18/2022 4:10:44 PM By: Worthy Keeler PA-C Entered By: Worthy Keeler on 11/18/2022 16:10:44 -------------------------------------------------------------------------------- Physical Exam Details Patient Name: Date of ServiceMila [facility], [name] 11/18/2022 3:15 PM Medical Record Number: [id number] Patient Account Number: [id number] Date of Birth/Sex: Treating RN: Apr 09, 1966 (57 y.o. [name]  Primary Care Provider: Fulton Reek Other Clinician: Referring Provider: Treating Provider/Extender: Rupert Stacks in Treatment: 38 Constitutional Well-nourished and well-hydrated in no acute distress. Respiratory normal breathing without  difficulty. Psychiatric this patient is able to make decisions and demonstrates good insight into disease process. Alert and Oriented x 3. pleasant and cooperative. Notes Upon inspection patient's wound bed actually showed signs of good granulation epithelization at this point. Fortunately I do not see any evidence of active infection locally nor systemically which is great news. With that being said we may switch back to the [facility] shortly but right now I still want to continue with the Dakin's moistened gauze dressing. Electronic Signature(s) Signed: 11/18/2022 4:11:27 PM By: Worthy Keeler PA-C Entered By: Worthy Keeler on 11/18/2022 16:11:26 [name] ([id number]) [id number] Page 4 of 9 -------------------------------------------------------------------------------- Physician Orders Details Patient Name: Date of ServiceMila Palmer [facility], [name] 11/18/2022 3:15 PM Medical Record Number: [id number] Patient Account Number: [id number] Date of Birth/Sex: Treating RN: [birth year] (57 y.o. [name] Primary Care Provider: Fulton Reek Other Clinician: Referring Provider: Treating Provider/Extender: Rupert Stacks in Treatment: 48 Verbal / Phone Orders: No Diagnosis Coding Follow-up Appointments Return Appointment in 1 week. Bathing/ Shower/ Hygiene Clean wound with Normal Saline or wound cleanser. - keep dressing dry or change after shower No tub bath. Anesthetic (Use 'Patient Medications' Section for Anesthetic Order Entry) Lidocaine applied to wound bed Off-Loading Gel wheelchair cushion Hospital bed/mattress - Has at home A fluidized (Group 3) - Has from Ethos ir Turn and reposition every 2 hours - Do not sit in wheelchair any longer than you have to--offload wound in bed as much as possible Other: - avoid sliding which causes friction as much as you can Additional Orders / Instructions Follow Nutritious Diet  and Increase Protein Intake Wound Treatment Wound #3 - Ischial Tuberosity Wound Laterality: Right Cleanser: Byram Ancillary Kit - 15 Day Supply (Generic) 3 x Per Week/30 Days Discharge Instructions: Use supplies as instructed; Kit contains: (15) Saline Bullets; (15) 3x3 Gauze; 15 pr Gloves Cleanser: Normal Saline 3 x Per Week/30 Days Discharge Instructions: Wash your hands with soap and water. Remove old dressing, discard into plastic bag and place into trash. Cleanse the wound with Normal Saline prior to applying a clean dressing using gauze sponges, not tissues or cotton balls. Do not scrub or use excessive force. Pat dry using gauze sponges, not tissue or cotton balls. Cleanser: Wound Cleanser 3 x Per Week/30 Days Discharge Instructions: Wash your hands with soap and water. Remove old dressing, discard into plastic bag and place into trash. Cleanse the wound with Wound Cleanser prior to applying a clean dressing using gauze sponges, not tissues or cotton balls. Do not scrub or use excessive force. Pat dry using gauze sponges, not tissue or cotton balls. Prim Dressing: Gauze (Generic) 3 x Per Week/30 Days ary Discharge Instructions: moistened with dakins Secondary Dressing: (BORDER) Zetuvit Plus SILICONE BORDER Dressing 4x4 (in/in) 3 x Per Week/30 Days Discharge Instructions: Please do not put silicone bordered dressings under wraps. Use non-bordered dressing only. Electronic Signature(s) Signed: 11/18/2022 5:39:55 PM By: Worthy Keeler PA-C Signed: 11/19/2022 1:32:16 PM By: [name] MSN RN CNS WTA Entered By: [name] on 11/18/2022 16:16:33 [name] ([id number]) [id number] Page 5 of 9 -------------------------------------------------------------------------------- Problem List Details Patient Name: Date of ServiceMila Palmer [facility], [name] 11/18/2022 3:15 PM Medical Record Number: [id number] Patient Account Number: [id number] Date of Birth/Sex:  Treating RN: [birth year] (57 y.o. [name] Primary Care Provider: Fulton Reek Other Clinician: Referring Provider: Treating Provider/Extender: [name] Weeks in Treatment: 29 Active Problems ICD-10 Encounter Code Description Active Date MDM Diagnosis L89.314 Pressure ulcer of right buttock, stage 4 01/15/2022 No Yes Q76.0 Spina bifida occulta 01/15/2022 No Yes Z93.3 Colostomy status 01/15/2022 No Yes I10 Essential (primary) hypertension 01/15/2022 No Yes I25.10 Atherosclerotic heart disease of native coronary artery without angina pectoris 01/15/2022 No Yes Inactive Problems ICD-10 Code Description Active Date Inactive Date L89.323 Pressure ulcer of left buttock, stage 3 02/05/2022 02/05/2022 Resolved Problems Electronic Signature(s) Signed: 11/18/2022 4:03:35 PM By: Worthy Keeler PA-C Entered By: Worthy Keeler on 11/18/2022 16:03:35 [name] ([id number]) [id number] Page 6 of 9 -------------------------------------------------------------------------------- Progress Note Details Patient Name: Date of ServiceMila [facility], [name] 11/18/2022 3:15 PM Medical Record Number: [id number] Patient Account Number: [id number] Date of Birth/Sex: Treating RN: 1966/03/22 (57 y.o. [name] Primary Care Provider: Fulton Reek Other Clinician: Referring Provider: Treating Provider/Extender: Rupert Stacks in Treatment: 91 Subjective Chief Complaint Information obtained from Patient Right ischial tuberosity pressure ulcer History of Present Illness (HPI) 57 year old male with a history of spina bifida presenting to [place] with a history of a open wound on the left gluteal region near his upper thigh which she's had for several weeks. He was seen in the ED recently at [facility] and was put on doxycycline and was advised some local care. his past medical history is significant for spinal bifid,  neurogenic bladder, kidney stones, sacral pressure sores, constipation. Past surgical history significant for partial cystectomy, ileal conduit, VP shunt removal, percutaneous nephro lithotripsy. In the remote past the patient says he's had some treatment for a ulcer on this area and was treated with a skin graft. Readmission: 10/16/2021 upon evaluation today patient appears to be doing somewhat poorly in regard to a fairly large wound noted over the right ischial tuberosity location. This is a stage IV pressure ulcer. Of note this is also positive for osteomyelitis upon a CT scan which was performed during the time that he was admitted in the hospital on 09/19/2021. With that being said it is noted in the right inferior buttock that he has a decubitus ulcer which extends to the posterior toe inferior right ischium where there is evidence of osteomyelitis. When he was here in the office actually missed that this was positive I missed read it and thought it said that there was no osteomyelitis. That is not the case there is in fact osteomyelitis noted here. I actually did review his notes as well in epic. Looking to the discharge summary it appears that the patient was admitted from 09/19/2021 through 09/23/2021. He was discharged home with home health care according to the note. With that being said from what I understand his sister actually takes care of him at this point. He was also to follow-up with a primary care provider and here at the wound care center within a week. I am just now seeing him on the 13th almost a month after discharge. He does have a history of again osteomyelitis of this right hip/ischial tuberosity location. 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 The reason for his admission was actually worsening of the decubitus ulcer upon  admission. He does have chronic osteomyelitis of this area. He was treated with empiric antibiotics he had acute kidney injury which improved with IV fluids he also had hypokalemia. Surgical debridement was performed by general surgery at that time and ID was consulted to assist with management according to the notes. IV antibiotics were recommended but patient declined and wanted oral antibiotics at discharge. Therefore no IV antibiotics were planned for discharge time. This case was under the care of Dr. Steva Ready while he was in the hospital when she did discharge him with a 2-week course of doxycycline and Augmentin according to the notes. It looks like Dr. Doy Hutching office did call and leave a message for the patient to get scheduled for a follow-up visit after hospital discharge I do not see where he showed up in fact it appears that the patient has a no-show letter from Dr. Doy Hutching office noted in epic due to a appointment that was missed on 10/06/2021. Looking at the pictures from Dr. Raelene Bott notes on 09/23/2021 it does appear that the wound is a little bit better as far as the cleanliness of the surface of the wound today but nonetheless still is a very significant wound. 10/26/2021 upon evaluation today patient appears to be doing okay in regard to his wound. Again this is a wound that he did indeed have osteomyelitis and previous. With that being said I do not see any signs of a worsening infection at this time which is good news. Overall I think that we are headed in the right direction although still I think he needs more aggressive offloading. 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 I do believe this would be ideal for him to be honest. Readmission: 01-15-2022 upon evaluation today patient presents for reevaluation he was last seen on October 26, 2021. At that time unfortunately he was having issues with a significant ischial pressure ulcer on the right ischial  tuberosity. Subsequently he ended up having decent response to the Dakin's moistened gauze dressing that was previously being utilized and subsequently the patient's sister who is his primary caregiver as well states that she decided just to try to manage this at home. Nonetheless unfortunately this is getting a little larger not better. He does spend a lot of the day in his chair. He does have an air mattress according what they tell me today. That was previously ordered. With that being said I do believe that at this time things do appear to be doing much better from the standpoint of infection I do not see any signs of overt infection. There is also no evidence of systemic infection. No fevers, chills, nausea, vomiting, or diarrhea. With that being said I do believe based on what I am seeing the wound is a bit cleaner and he possibly could be an excellent candidate for a wound VAC. 02-05-2022 upon evaluation today patient appears to be doing about the same in regard to his wound. Unfortunately he is not any better but he also really has not had the wound VAC on sufficiently. There is been some confusion with the orders part of that I think is our follow-up with the way the order was written part of it may be with how the wound VAC is being placed either way we need to see what we can do to try to improve things in this regard. We will clarify the orders today going forward for home health. Unfortunately the patient does have  a new wound today which is on the opposite side. This is not good 1 was bad enough have another area to open is definitely not a good thing. Its not as deep but nonetheless is also not very shallow either. We may potentially be able to get this to the point that we could bridge the 2 together in order to wound VAC both but right now want a focus on to try to get the wound VAC going for the main wound initially we will likely use Hydrofera Blue on the new wound.Marland Kitchen 02-19-2022 upon  evaluation today patient's wound appears to be doing a little bit worse in the way of maceration fortunately there is no signs of active infection locally or systemically which is great news. No fevers, chills, nausea, vomiting, or diarrhea. 03-05-2022 upon evaluation today patient unfortunately does not appear to be doing nearly as good in regard to his wounds as I would like to see. Fortunately I do not see any evidence of active infection that is obvious although I am can obtain a wound culture to ensure that there is not anything getting worse here as far as the wound without the wound VAC on his concern. With that being said I will go ahead and get this sent in and evaluated will initiate treatment as needed going forward if necessary. With regard to the wound with the wound VAC this continues to be placed directly over the wound and there is obvious signs of deep tissue injury. The suction being here means that he is sitting on it I think this may be part of the reason why it is coming off although not [id number] certain. 03-12-2022 upon evaluation today patient still has not gotten the antibiotics that were called in for him 4 days ago. He tells me that his sister just told him this morning that they were ready but that she has not had a chance to go pick them up. Nonetheless I feel like that this is really something he needs any should have been taken that not the other antibiotics which were not doing the job for him to be honest. The patient states and I completely agree that there is really no way he would have known relying on his sister to let him know obviously. Nonetheless I do believe that he needs to not be taking any other antibiotics and be on the Levaquin as soon as possible. He tells me that she said she was going to pick them up today. In regard to home health I am hopeful that they actually have been bridging this now. I do think that the wound looks better and this is good news. With that  being said I am not certain that I can really tell for sure how this was attached it was removed before he came in and the black foam left in place. I really want him to leave the wound VAC in place until he comes in so that we can monitor and make sure that this is being done correctly he voiced understanding. [place], [name] ([id number]) [id number] Page 7 of 9 03-19-2022 upon evaluation today and much happier with how things appear as far as the patient's wounds are concerned. I think he is on a much better track and they are doing a better job at bridging although its not quite where I like to see it is still more posterior which I think the patient needs to be a little bit more lateral on his side. Either  way this is something that I did marked with a small dot today for him to tell the home health nurses well so she can bridge it to that location also think the tubing should go up instead of going down to just make it less cumbersome overall for him. 04-19-2022 upon evaluation today patient appears to be doing a little better in regard to his wounds. Fortunately I do not see any signs of infection I do believe he is on the right track. The wound VAC does seem to be helping to clean the wound up a bit and bring it in from the base at work. This is good news. 05-10-2022 upon evaluation today patient appears to be doing well currently in regard to his wounds in general in fact of the smaller of the 2 wounds without the wound VAC actually appears to be doing so much better this is almost completely closed and very pleased in that regard. He has been staying off of this he tells me and shows. With that being said the wound with the wound VAC is going require some sharp debridement there is some tissue which is not quite as healthy and we are going to go ahead and continue that for probably 2 more weeks although I think we may be closer to discontinuing this as well since it  has healed and quite significantly. 05-24-2022 upon evaluation today patient appears to be doing well with regard to his wound in fact this looks better than I have been expected. This actually I think is a good time to go ahead and switch away from the wound VAC based on what I am seeing. I think we go to [facility] with a bordered foam dressing. 06-21-2022 upon evaluation today patient appears to be doing well currently in regard to his wound this is measuring smaller and looks well. Fortunately there does not appear to be any signs of active infection locally or systemically at this time which is great news. No fevers, chills, nausea, vomiting, or diarrhea. 07-29-2022 upon evaluation today patient appears to be doing well currently in regard to his wound. This is actually measuring smaller although there is a lot of hypergranulation noted. Fortunately there does not appear to be any signs of active infection locally or systemically at this time. No fevers, chills, nausea, vomiting, or diarrhea. 11/9; pressure ulcer on the right buttock in the setting of spina bifida and lower extremity paralysis. He has been using [facility] 09-02-2022 upon evaluation today patient appears to be doing well currently in regard to his wound. We have been using Hydrofera Blue this is showing signs of improvement as far as getting smaller is concerned. There is some drainage around the edges of the wound but I think a lot of this is due to the fact he did not even have a dressing on that he came in today. 12/29; right buttock pressure area which was initially stage IV. This is in the setting of a patient with spina bifida spends most of his time in a wheelchair. I am not certain how much he is offloading this. He has been using for [facility] as a primary dressing. When he was here the last visit he was felt to have hyper granulated tissue and was treated with silver nitrate chemical cauterization 10-14-2022  upon evaluation today patient appears to be doing poorly in regard to the wound in the right ischial location. Unfortunately he tells me that the dressing seems to be  falling off frequently. Unfortunately I think that this has contributed to him developing an infection which appears to be exemplified by the fact that he has bone exposed which is actually necrotic and working its way out of the wound up and have to perform a fairly significant debridement today it does appear. I think that he needs to have these dressings ensure that they are in place at all times. Also discussed with the patient that we will get a need to do some testing in order to see if he indeed has an infection. If he does have a bone infection which I think is pretty likely to be osteomyelitis of this ischial location. I discussed that with the patient in detail today and depending on what we see him and make any adjustments in care as far as going forward is concerned. Obviously depressed antibiotic that we gave him that he tells me was somewhat irritating as far as the stomach side effects are concerned. He states he really would not want to be on that again. Either way I feel he is probably going to be placed on something once we get the results of the culture back. I am honestly concerned about the fact that he comes into the clinic without any dressings in place I am not sure how often they are really staying where they need to be as far as this wound is concerned. I actually cannot remember seeing him anytime in the past 2 months or so at least that he came in with a dressing intact. 10-21-2022 upon evaluation today patient presents for initial inspection here in our clinic concerning issues that he has been having with a wound in the sacral area. Again last time he was here I did debride away necrotic bone which was confirmed on pathology although it was not necessarily confirmed that this was secondary to osteomyelitis on  the pathology report. Also the x-ray did not neither confirm nor deny the presence of osteomyelitis and his culture unfortunately showed multiple organisms with nothing predominance of this does not help to rule and tailor down exactly what is going on here. Fortunately I do not see any signs of active infection systemically which is great news. With that being said locally I am definitely seeing some issues here. In fact this is warm to touch around the sacral area and I think that cellulitis is definitely present I just cannot pinpoint the exact organism. With that being said I did discuss with the patient as well today that I do believe that he probably needs an MRI in order to further quantify the extent of her likely osteomyelitis and this again is something that we are going to order this will need to be without contrast due to the fact that he is allergic to contrast dye. 11-18-2022 upon evaluation today patient's wound is actually showing some signs of improvement currently. We have been doing the Dakin's moistened gauze dressing which I think is helping to clean up the wound for seeing some granulation growing and still the tissue is not quite as healthy as I would love to see but better than previous. He does have osteomyelitis but the good news is he seems to be treated well with the antibiotics he does have a refill that was just picked up and I think that he needs to continue to take that for certain. Objective Constitutional Well-nourished and well-hydrated in no acute distress. Vitals Time Taken: 3:43 PM, Height: 60 in, Temperature: 97.3 F,  Pulse: 70 bpm, Respiratory Rate: 18 breaths/min, Blood Pressure: 91/67 mmHg. Respiratory normal breathing without difficulty. Psychiatric this patient is able to make decisions and demonstrates good insight into disease process. Alert and Oriented x 3. pleasant and cooperative. General Notes: Upon inspection patient's wound bed actually showed  signs of good granulation epithelization at this point. Fortunately I do not see any evidence of active infection locally nor systemically which is great news. With that being said we may switch back to the [facility] shortly but right now I still want to continue with the Dakin's moistened gauze dressing. Integumentary (Hair, Skin) Wound #3 status is Open. Original cause of wound was Gradually Appeared. The date acquired was: 09/03/2021. The wound has been in treatment 43 weeks. [place], [name] ([id number]) [id number] Page 8 of 9 The wound is located on the Right Ischial Tuberosity. The wound measures 4.2cm length x 5.5cm width x 1.4cm depth; 18.143cm^2 area and 25.4cm^3 volume. There is bone, muscle, and Fat Layer (Subcutaneous Tissue) exposed. There is a large amount of serosanguineous drainage noted. There is large (67-100%) red, pink granulation within the wound bed. There is a small (1-33%) amount of necrotic tissue within the wound bed including Eschar, Adherent Slough and Necrosis of Muscle. Assessment Active Problems ICD-10 Pressure ulcer of right buttock, stage 4 Spina bifida occulta Colostomy status Essential (primary) hypertension Atherosclerotic heart disease of native coronary artery without angina pectoris Plan Follow-up Appointments: Return Appointment in 1 week. Bathing/ Shower/ Hygiene: Clean wound with Normal Saline or wound cleanser. - keep dressing dry or change after shower No tub bath. Anesthetic (Use 'Patient Medications' Section for Anesthetic Order Entry): Lidocaine applied to wound bed Off-Loading: Gel wheelchair cushion Hospital bed/mattress - Has at home Air fluidized (Group 3) - Has from [place] and reposition every 2 hours - Do not sit in wheelchair any longer than you have to--offload wound in bed as much as possible Other: - avoid sliding which causes friction as much as you can Additional Orders /  Instructions: Follow Nutritious Diet and Increase Protein Intake WOUND #3: - Ischial Tuberosity Wound Laterality: Right Cleanser: Byram Ancillary Kit - 15 Day Supply (Generic) 3 x Per Week/30 Days Discharge Instructions: Use supplies as instructed; Kit contains: (15) Saline Bullets; (15) 3x3 Gauze; 15 pr Gloves Cleanser: Normal Saline 3 x Per Week/30 Days Discharge Instructions: Wash your hands with soap and water. Remove old dressing, discard into plastic bag and place into trash. Cleanse the wound with Normal Saline prior to applying a clean dressing using gauze sponges, not tissues or cotton balls. Do not scrub or use excessive force. Pat dry using gauze sponges, not tissue or cotton balls. Cleanser: Wound Cleanser 3 x Per Week/30 Days Discharge Instructions: Wash your hands with soap and water. Remove old dressing, discard into plastic bag and place into trash. Cleanse the wound with Wound Cleanser prior to applying a clean dressing using gauze sponges, not tissues or cotton balls. Do not scrub or use excessive force. Pat dry using gauze sponges, not tissue or cotton balls. Prim Dressing: Gauze (Generic) 3 x Per Week/30 Days ary Discharge Instructions: moistened with dakins Secondary Dressing: (BORDER) Zetuvit Plus SILICONE BORDER Dressing 4x4 (in/in) 3 x Per Week/30 Days Discharge Instructions: Please do not put silicone bordered dressings under wraps. Use non-bordered dressing only. 1. I would recommend currently that the patient should continue with the wound care measures as before specifically with regard to the Dakin's moistened gauze dressing which I think is still going to  be the best way to go. 2. I am also can recommend that we continue to have him appropriately offload I think this is of utmost importance. 3. I would also suggest that he should continue with the bordered foam dressing to cover we may in short order to switch back over to [facility] which has previously done  well with. We will see patient back for reevaluation in 2 weeks here in the clinic. If anything worsens or changes patient will contact our office for additional recommendations. Electronic Signature(s) Signed: 11/18/2022 4:31:50 PM By: Worthy Keeler PA-C Entered By: Worthy Keeler on 11/18/2022 16:31:50 [name] ([id number]) [id number] Page 9 of 9 -------------------------------------------------------------------------------- SuperBill Details Patient Name: [name] 11/18/2022 Medical Record Number: [id number] Patient Account Number: [id number] Date of Birth/Sex: Treating RN: 1966/06/22 (57 y.o. [name] Primary Care Provider: Fulton Reek Other Clinician: Referring Provider: Treating Provider/Extender: Rupert Stacks in Treatment: 43 Diagnosis Coding ICD-10 Codes Code Description L89.314 Pressure ulcer of right buttock, stage 4 Q76.0 Spina bifida occulta Z93.3 Colostomy status I10 Essential (primary) hypertension I25.10 Atherosclerotic heart disease of native coronary artery without angina pectoris Facility Procedures : CPT4 Code: [id number] Description: 99213 - WOUND CARE VISIT-LEV 3 EST PT Modifier: Quantity: 1 Physician Procedures : CPT4 Code Description Modifier [id number] - WC PHYS LEVEL 3 - EST PT ICD-10 Diagnosis Description L89.314 Pressure ulcer of right buttock, stage 4 Q76.0 Spina bifida occulta Z93.3 Colostomy status I10 Essential (primary) hypertension Quantity: 1 Electronic Signature(s) Signed: 11/18/2022 4:33:03 PM By: Worthy Keeler PA-C Entered By: Worthy Keeler on 11/18/2022 16:33:03

## 2022-11-20 NOTE — Progress Notes (Signed)
[place], [name] ([id number]) [id number] Page 1 of 9 Visit Report for 11/18/2022 Arrival Information Details Patient Name: Date of Service: [name] [facility], [place] [name] 11/18/2022 3:15 PM Medical Record Number: [id number] Patient Account Number: [id number] Date of Birth/Sex: Treating RN: 02/23/1966 (57 y.o. [name] Primary Care [place]: Fulton Reek Other Clinician: Referring Zackery Brine: Treating Arlenne Kimbley/Extender: Rupert Stacks in Treatment: 16 Visit Information History Since Last Visit Added or deleted any medications: No Patient Arrived: Wheel Chair Any new allergies or adverse reactions: No Arrival Time: 15:40 Had a fall or experienced change in No Accompanied By: self activities of daily living that may affect Transfer Assistance: None risk of falls: Patient Identification Verified: Yes Signs or symptoms of abuse/neglect since last visito No Secondary Verification Process Completed: Yes Hospitalized since last visit: No Patient Requires Transmission-Based Precautions: No Implantable device outside of the clinic excluding No Patient Has Alerts: Yes cellular tissue based products placed in the center Patient Alerts: NOT diabetic since last visit: Has Dressing in Place as Prescribed: Yes Pain Present Now: No Electronic Signature(s) Signed: 11/19/2022 1:32:16 PM By: [name] MSN RN CNS WTA Entered By: [name] on 11/18/2022 15:41:13 -------------------------------------------------------------------------------- Clinic Level of Care Assessment Details Patient Name: Date of ServiceMila [name] [facility], [name] 11/18/2022 3:15 PM Medical Record Number: [id number] Patient Account Number: [id number] Date of Birth/Sex: Treating RN: [birth year] (57 y.o. [name] Primary Care Vaeda Westall: Fulton Reek Other Clinician: Referring Lis Savitt: Treating Crystian Frith/Extender: Rupert Stacks in Treatment: 43 Clinic  Level of Care Assessment Items TOOL 4 Quantity Score X- 1 0 Use when only an EandM is performed on FOLLOW-UP visit ASSESSMENTS - Nursing Assessment / Reassessment X- 1 10 Reassessment of Co-morbidities (includes updates in patient status) X- 1 5 Reassessment of Adherence to Treatment Plan [name], [name] ([id number]) [id number] Page 2 of 9 ASSESSMENTS - Wound and Skin A ssessment / Reassessment X - Simple Wound Assessment / Reassessment - one wound 1 5 []$  - 0 Complex Wound Assessment / Reassessment - multiple wounds []$  - 0 Dermatologic / Skin Assessment (not related to wound area) ASSESSMENTS - Focused Assessment []$  - 0 Circumferential Edema Measurements - multi extremities []$  - 0 Nutritional Assessment / Counseling / Intervention []$  - 0 Lower Extremity Assessment (monofilament, tuning fork, pulses) []$  - 0 Peripheral Arterial Disease Assessment (using hand held doppler) ASSESSMENTS - Ostomy and/or Continence Assessment and Care []$  - 0 Incontinence Assessment and Management []$  - 0 Ostomy Care Assessment and Management (repouching, etc.) PROCESS - Coordination of Care X - Simple Patient / Family Education for ongoing care 1 15 []$  - 0 Complex (extensive) Patient / Family Education for ongoing care X- 1 10 Staff obtains Programmer, systems, Records, T Results / Process Orders est []$  - 0 Staff telephones HHA, Nursing Homes / Clarify orders / etc []$  - 0 Routine Transfer to another Facility (non-emergent condition) []$  - 0 Routine Hospital Admission (non-emergent condition) []$  - 0 New Admissions / Biomedical engineer / Ordering NPWT Apligraf, etc. , []$  - 0 Emergency Hospital Admission (emergent condition) X- 1 10 Simple Discharge Coordination []$  - 0 Complex (extensive) Discharge Coordination PROCESS - Special Needs []$  - 0 Pediatric / Minor Patient Management []$  - 0 Isolation Patient Management []$  - 0 Hearing / Language / Visual special needs []$   - 0 Assessment of Community assistance (transportation, D/C planning, etc.) []$  - 0 Additional assistance / Altered mentation []$  - 0 Support Surface(s) Assessment (bed, cushion, seat, etc.) INTERVENTIONS -  Wound Cleansing / Measurement X - Simple Wound Cleansing - one wound 1 5 []$  - 0 Complex Wound Cleansing - multiple wounds X- 1 5 Wound Imaging (photographs - any number of wounds) []$  - 0 Wound Tracing (instead of photographs) X- 1 5 Simple Wound Measurement - one wound []$  - 0 Complex Wound Measurement - multiple wounds INTERVENTIONS - Wound Dressings X - Small Wound Dressing one or multiple wounds 1 10 []$  - 0 Medium Wound Dressing one or multiple wounds []$  - 0 Large Wound Dressing one or multiple wounds []$  - 0 Application of Medications - topical []$  - 0 Application of Medications - injection INTERVENTIONS - Miscellaneous []$  - 0 External ear exam [name], [name] ([id number]) [id number] Page 3 of 9 []$  - 0 Specimen Collection (cultures, biopsies, blood, body fluids, etc.) []$  - 0 Specimen(s) / Culture(s) sent or taken to Lab for analysis []$  - 0 Patient Transfer (multiple staff / Harrel Lemon Lift / Similar devices) []$  - 0 Simple Staple / Suture removal (25 or less) []$  - 0 Complex Staple / Suture removal (26 or more) []$  - 0 Hypo / Hyperglycemic Management (close monitor of Blood Glucose) []$  - 0 Ankle / Brachial Index (ABI) - Chase not check if billed separately X- 1 5 Vital Signs Has the patient been seen at the hospital within the last three years: Yes Total Score: 85 Level Of Care: New/Established - Level 3 Electronic Signature(s) Signed: 11/19/2022 1:32:16 PM By: [name] MSN RN CNS WTA Entered By: [name] on 11/18/2022 16:17:27 -------------------------------------------------------------------------------- Encounter Discharge Information Details Patient Name: Date of Service: [name] [name], [name] 11/18/2022 3:15 PM Medical Record  Number: [id number] Patient Account Number: [id number] Date of Birth/Sex: Treating RN: Nov 15, 1965 (57 y.o. [name] Primary Care Avan Gullett: Fulton Reek Other Clinician: Referring Jermya Dowding: Treating Audria Takeshita/Extender: Rupert Stacks in Treatment: 61 Encounter Discharge Information Items Discharge Condition: Stable Ambulatory Status: Wheelchair Discharge Destination: Home Transportation: Private Auto Accompanied By: self Schedule Follow-up Appointment: Yes Clinical Summary of Care: Electronic Signature(s) Signed: 11/19/2022 1:32:16 PM By: [name] MSN RN CNS WTA Entered By: [name] on 11/18/2022 16:18:36 -------------------------------------------------------------------------------- Lower Extremity Assessment Details Patient Name: Date of Service: [facility] [name], [name] 11/18/2022 3:15 PM [name] ([id number]) [id number] Page 4 of 9 Medical Record Number: [id number] Patient Account Number: [id number] Date of Birth/Sex: Treating RN: 11-Sep-1966 (57 y.o. [name] Primary Care Chiron Campione: Fulton Reek Other Clinician: Referring Leiam Hopwood: Treating Zakar Brosch/Extender: [name] Weeks in Treatment: 19 Electronic Signature(s) Signed: 11/19/2022 1:32:16 PM By: [name] MSN RN CNS WTA Entered By: [name] on 11/18/2022 15:53:59 -------------------------------------------------------------------------------- Multi Wound Chart Details Patient Name: Date of Service: [name] [name], [name] 11/18/2022 3:15 PM Medical Record Number: [id number] Patient Account Number: [id number] Date of Birth/Sex: Treating RN: 1966-09-19 (57 y.o. [name] Primary Care Klohe Lover[place]: Fulton Reek Other Clinician: Referring Grete Bosko: Treating Steed Kanaan/Extender: [name] Weeks in Treatment: 47 Vital Signs Height(in): 60 Pulse(bpm): 91 Weight(lbs): Blood Pressure(mmHg): 91/67 Body Mass  Index(BMI): Temperature(F): 97.3 Respiratory Rate(breaths/min): 18 [3:Photos:] [N/A:N/A] Right Ischial Tuberosity N/A N/A Wound Location: Gradually Appeared N/A N/A Wounding Event: Pressure Ulcer N/A N/A Primary Etiology: Cataracts, Glaucoma, Coronary Artery N/A N/A Comorbid History: Disease, Hypertension, History of pressure wounds, Osteoarthritis, Paraplegia 09/03/2021 N/A N/A Date Acquired: 43 N/A N/A Weeks of Treatment: Open N/A N/A Wound Status: No N/A N/A Wound Recurrence: 4.2x5.5x1.4 N/A N/A Measurements L x W x D (cm) 18.143 N/A N/A  A (cm) : rea 25.4 N/A N/A Volume (cm) : -3.10% N/A N/A % Reduction in A rea: 42.20% N/A N/A % Reduction in Volume: Category/Stage IV N/A N/A Classification: Large N/A N/A Exudate A mount: Serosanguineous N/A N/A Exudate Type: red, brown N/A N/A Exudate Color: Large (67-100%) N/A N/A Granulation A mount: Red, Pink N/A N/A Granulation Quality: Small (1-33%) N/A N/A Necrotic A mount: Eschar, Adherent Slough N/A N/A Necrotic Tissue: Fat Layer (Subcutaneous Tissue): Yes N/A N/A Exposed Structures: MuscleVelta Addison [place], [name] ([id number]) [id number] Page 5 of 9 Bone: Yes None N/A N/A Epithelialization: Treatment Notes Electronic Signature(s) Signed: 11/19/2022 1:32:16 PM By: [name] MSN RN CNS WTA Entered By: [name] on 11/18/2022 15:54:11 -------------------------------------------------------------------------------- Multi-Disciplinary Care Plan Details Patient Name: Date of Service: [name] [facility], [name] 11/18/2022 3:15 PM Medical Record Number: [id number] Patient Account Number: [id number] Date of Birth/Sex: Treating RN: Jun 01, 1966 (57 y.o. [name] Primary Care Keturah Yerby: Fulton Reek Other Clinician: Referring Haitham Dolinsky: Treating Otisha Spickler/Extender: Rupert Stacks in Treatment: 81 Active Inactive Pressure Nursing Diagnoses: Knowledge deficit  related to causes and risk factors for pressure ulcer development Knowledge deficit related to management of pressures ulcers Potential for impaired tissue integrity related to pressure, friction, moisture, and shear Goals: Patient will remain free from development of additional pressure ulcers Date Initiated: 01/15/2022 Target Resolution Date: 02/26/2022 Goal Status: Active Patient/caregiver will verbalize risk factors for pressure ulcer development Date Initiated: 01/15/2022 Target Resolution Date: 03/05/2022 Goal Status: Active Interventions: Assess: immobility, friction, shearing, incontinence upon admission and as needed Assess offloading mechanisms upon admission and as needed Assess potential for pressure ulcer upon admission and as needed Notes: Electronic Signature(s) Signed: 11/19/2022 1:32:16 PM By: [name] MSN RN CNS WTA Entered By: [name] on 11/18/2022 16:17:56 [name] ([id number]) [id number] Page 6 of 9 -------------------------------------------------------------------------------- Pain Assessment Details Patient Name: Date of ServiceMila [name] [facility], [name] 11/18/2022 3:15 PM Medical Record Number: [id number] Patient Account Number: [id number] Date of Birth/Sex: Treating RN: 02-16-1966 (57 y.o. [name] Primary Care Kelii Chittum: Fulton Reek Other Clinician: Referring Raigen Jagielski: Treating Ceyda Peterka/Extender: Rupert Stacks in Treatment: 58 Active Problems Location of Pain Severity and Description of Pain Patient Has Paino No Site Locations Pain Management and Medication Current Pain Management: Electronic Signature(s) Signed: 11/19/2022 1:32:16 PM By: [name] MSN RN CNS WTA Entered By: [name] on 11/18/2022 15:43:34 -------------------------------------------------------------------------------- Patient/Caregiver Education Details Patient Name: Date of Service: [name] [name], [name]  2/15/2024andnbsp3:15 PM Medical Record Number: [id number] Patient Account Number: [id number] Date of Birth/Gender: Treating RN: [birth year] (57 y.o. [name] Primary Care Physician: Fulton Reek Other Clinician: Referring Physician: Treating Physician/Extender: Rupert Stacks in Treatment: [address] [place] ([id number]) [id number] Page 7 of 9 Education Assessment Education Provided To: Patient Education Topics Provided Wound/Skin Impairment: Handouts: Caring for Your Ulcer Methods: Explain/Verbal Responses: State content correctly Electronic Signature(s) Signed: 11/19/2022 1:32:16 PM By: [name] MSN RN CNS WTA Entered By: [name] on 11/18/2022 16:17:49 -------------------------------------------------------------------------------- Wound Assessment Details Patient Name: Date of Service: [name] [name], [name] 11/18/2022 3:15 PM Medical Record Number: [id number] Patient Account Number: [id number] Date of Birth/Sex: Treating RN: 11-27-1965 (57 y.o. [name] Primary Care Denora Wysocki: Fulton Reek Other Clinician: Referring Asuncion Tapscott: Treating Navaeh Kehres/Extender: [name] Weeks in Treatment: 81 Wound Status Wound Number: 3 Primary Pressure Ulcer Etiology: Wound Location: Right Ischial Tuberosity Wound Open Wounding Event: Gradually Appeared Status: Date Acquired: 09/03/2021 Comorbid  Cataracts, Glaucoma, Coronary Artery Disease, Hypertension, Weeks Of Treatment: 43 History: History of pressure wounds, Osteoarthritis, Paraplegia Clustered Wound: No Photos Wound Measurements Length: (cm) 4.2 Width: (cm) 5.5 Depth: (cm) 1.4 Area: (cm) 18.143 Volume: (cm) 25.4 % Reduction in Area: -3.1% % Reduction in Volume: 42.2% Epithelialization: None Wound Description Classification: Category/Stage IV Exudate Amount: Large Exudate Type: Serosanguineous [name], [name] ([id number]) Exudate  Color: red, brown Foul Odor After Cleansing: No Slough/Fibrino Yes [phone].pdf Page 8 of 9 Wound Bed Granulation Amount: Large (67-100%) Exposed Structure Granulation Quality: Red, Pink Fat Layer (Subcutaneous Tissue) Exposed: Yes Necrotic Amount: Small (1-33%) Muscle Exposed: Yes Necrotic Quality: Eschar, Adherent Slough Necrosis of Muscle: Yes Bone Exposed: Yes Treatment Notes Wound #3 (Ischial Tuberosity) Wound Laterality: Right Cleanser [place]ram Ancillary Kit - 15 Day Supply Discharge Instruction: Use supplies as instructed; Kit contains: (15) Saline Bullets; (15) 3x3 Gauze; 15 pr Gloves Normal Saline Discharge Instruction: Wash your hands with soap and water. Remove old dressing, discard into plastic bag and place into trash. Cleanse the wound with Normal Saline prior to applying a clean dressing using gauze sponges, not tissues or cotton balls. Chase not scrub or use excessive force. Pat dry using gauze sponges, not tissue or cotton balls. Wound Cleanser Discharge Instruction: Wash your hands with soap and water. Remove old dressing, discard into plastic bag and place into trash. Cleanse the wound with Wound Cleanser prior to applying a clean dressing using gauze sponges, not tissues or cotton balls. Chase not scrub or use excessive force. Pat dry using gauze sponges, not tissue or cotton balls. Peri-Wound Care Topical Primary Dressing Gauze Discharge Instruction: moistened with dakins Secondary Dressing (BORDER) Zetuvit Plus SILICONE BORDER Dressing 4x4 (in/in) Discharge Instruction: Please Chase not put silicone bordered dressings under wraps. Use non-bordered dressing only. Secured With Compression Wrap Compression Stockings Environmental education officer) Signed: 11/19/2022 1:32:16 PM By: [name] MSN RN CNS WTA Entered By: [name] on 11/18/2022 15:53:47 -------------------------------------------------------------------------------- Vitals  Details Patient Name: Date of Service: [name] [name], [name] 11/18/2022 3:15 PM Medical Record Number: [id number] Patient Account Number: [id number] Date of Birth/Sex: Treating RN: [birth year] (57 y.o. [name] Primary Care Alyha Marines: Fulton Reek Other Clinician: Referring Phat Dalton: Treating Rome Schlauch/Extender: [name] Weeks in Treatment: 69 Vital Signs Time Taken: 15:43 Temperature (F): 97.3 Height (in): 60 Pulse (bpm): [place], [place] ([id number]) [phone].pdf Page 9 of 9 Respiratory Rate (breaths/min): 18 Blood Pressure (mmHg): 91/67 Reference Range: 80 - 120 mg / dl Electronic Signature(s) Signed: 11/19/2022 1:32:16 PM By: [name] MSN RN CNS WTA Entered By: [name] on 11/18/2022 15:43:28

## 2022-12-02 ENCOUNTER — Ambulatory Visit: Payer: 59 | Admitting: Physician Assistant

## 2022-12-09 ENCOUNTER — Encounter: Payer: 59 | Attending: Physician Assistant | Admitting: Internal Medicine

## 2022-12-09 DIAGNOSIS — L89314 Pressure ulcer of right buttock, stage 4: Secondary | ICD-10-CM | POA: Insufficient documentation

## 2022-12-09 DIAGNOSIS — Q760 Spina bifida occulta: Secondary | ICD-10-CM | POA: Diagnosis not present

## 2022-12-09 DIAGNOSIS — I129 Hypertensive chronic kidney disease with stage 1 through stage 4 chronic kidney disease, or unspecified chronic kidney disease: Secondary | ICD-10-CM | POA: Diagnosis not present

## 2022-12-09 DIAGNOSIS — N183 Chronic kidney disease, stage 3 unspecified: Secondary | ICD-10-CM | POA: Insufficient documentation

## 2022-12-09 DIAGNOSIS — Z09 Encounter for follow-up examination after completed treatment for conditions other than malignant neoplasm: Secondary | ICD-10-CM | POA: Diagnosis not present

## 2022-12-09 DIAGNOSIS — N319 Neuromuscular dysfunction of bladder, unspecified: Secondary | ICD-10-CM | POA: Insufficient documentation

## 2022-12-09 DIAGNOSIS — Z933 Colostomy status: Secondary | ICD-10-CM | POA: Diagnosis not present

## 2022-12-09 DIAGNOSIS — Z87442 Personal history of urinary calculi: Secondary | ICD-10-CM | POA: Diagnosis not present

## 2022-12-09 DIAGNOSIS — I2510 Atherosclerotic heart disease of native coronary artery without angina pectoris: Secondary | ICD-10-CM | POA: Diagnosis not present

## 2022-12-09 DIAGNOSIS — Z982 Presence of cerebrospinal fluid drainage device: Secondary | ICD-10-CM | POA: Insufficient documentation

## 2022-12-10 NOTE — Progress Notes (Signed)
[name], [name] ([id number]) [id number] Page 1 of 9 Visit Report for 12/09/2022 Arrival Information Details Patient Name: Date of Service: [name] [name] [name] 12/09/2022 11:15 A M Medical Record Number: [id number] Patient Account Number: [id number] Date of Birth/Sex: Treating RN: [name] (56 y.o. [name]) [name] Primary Care [name]: [name] Other Clinician: Referring [name]: Treating [name]/Extender: RO BSO N, MICHA EL [name] in Treatment: 50 Visit Information History Since Last Visit Added or deleted any medications: No Patient Arrived: Wheel Chair Any new allergies or adverse reactions: No Arrival Time: 11:10 Had a fall or experienced change in No Accompanied By: self activities of daily living that may affect Transfer Assistance: Transfer Board risk of falls: Patient Identification Verified: Yes Signs or symptoms of abuse/neglect since last visito No Secondary Verification Process Completed: Yes Hospitalized since last visit: No Patient Requires Transmission-Based Precautions: No Implantable device outside of the clinic excluding No Patient Has Alerts: Yes cellular tissue based products placed in the center Patient Alerts: NOT diabetic since last visit: Has Dressing in Place as Prescribed: Yes Pain Present Now: No Electronic Signature(s) Signed: 12/09/2022 11:20:57 AM By: Chase Coria RN Entered By: [name] on 12/09/2022 11:20:57 -------------------------------------------------------------------------------- Clinic Level of Care Assessment Details Patient Name: Date of ServiceMila [name] [facility], [name] NA [name] 12/09/2022 11:15 A M Medical Record Number: [id number] Patient Account Number: [id number] Date of Birth/Sex: Treating RN: [name] (56 y.o. [name]) [name] Primary Care Ellajane Stong: [name] Other Clinician: Referring Tinsley Everman: Treating [name]/Extender: RO BSO N, MICHA EL [name] in  Treatment: 46 Clinic Level of Care Assessment Items TOOL 4 Quantity Score X- 1 0 Use when only Chase EandM is performed on FOLLOW-UP visit ASSESSMENTS - Nursing Assessment / Reassessment X- 1 10 Reassessment of Co-morbidities (includes updates in patient status) X- 1 5 Reassessment of Adherence to Treatment Plan [name] ([id number]) [id number] Page 2 of 9 ASSESSMENTS - Wound and Skin A ssessment / Reassessment X - Simple Wound Assessment / Reassessment - one wound 1 5 '[]'$  - 0 Complex Wound Assessment / Reassessment - multiple wounds '[]'$  - 0 Dermatologic / Skin Assessment (not related to wound area) ASSESSMENTS - Focused Assessment '[]'$  - 0 Circumferential Edema Measurements - multi extremities '[]'$  - 0 Nutritional Assessment / Counseling / Intervention '[]'$  - 0 Lower Extremity Assessment (monofilament, tuning fork, pulses) '[]'$  - 0 Peripheral Arterial Disease Assessment (using hand held doppler) ASSESSMENTS - Ostomy and/or Continence Assessment and Care '[]'$  - 0 Incontinence Assessment and Management '[]'$  - 0 Ostomy Care Assessment and Management (repouching, etc.) PROCESS - Coordination of Care X - Simple Patient / Family Education for ongoing care 1 15 '[]'$  - 0 Complex (extensive) Patient / Family Education for ongoing care '[]'$  - 0 Staff obtains Programmer, systems, Records, T Results / Process Orders est '[]'$  - 0 Staff telephones HHA, Nursing Homes / Clarify orders / etc '[]'$  - 0 Routine Transfer to another Facility (non-emergent condition) '[]'$  - 0 Routine Hospital Admission (non-emergent condition) '[]'$  - 0 New Admissions / Biomedical engineer / Ordering NPWT Apligraf, etc. , '[]'$  - 0 Emergency Hospital Admission (emergent condition) X- 1 10 Simple Discharge Coordination '[]'$  - 0 Complex (extensive) Discharge Coordination PROCESS - Special Needs '[]'$  - 0 Pediatric / Minor Patient Management '[]'$  - 0 Isolation Patient Management '[]'$  - 0 Hearing / Language /  Visual special needs '[]'$  - 0 Assessment of Community assistance (transportation, D/C planning, etc.) '[]'$  - 0 Additional assistance / Altered mentation '[]'$  - 0 Support  Surface(s) Assessment (bed, cushion, seat, etc.) INTERVENTIONS - Wound Cleansing / Measurement X - Simple Wound Cleansing - one wound 1 5 '[]'$  - 0 Complex Wound Cleansing - multiple wounds X- 1 5 Wound Imaging (photographs - any number of wounds) '[]'$  - 0 Wound Tracing (instead of photographs) X- 1 5 Simple Wound Measurement - one wound '[]'$  - 0 Complex Wound Measurement - multiple wounds INTERVENTIONS - Wound Dressings X - Small Wound Dressing one or multiple wounds 1 10 '[]'$  - 0 Medium Wound Dressing one or multiple wounds '[]'$  - 0 Large Wound Dressing one or multiple wounds '[]'$  - 0 Application of Medications - topical '[]'$  - 0 Application of Medications - injection INTERVENTIONS - Miscellaneous '[]'$  - 0 External ear exam [name] ([id number]) [id number] Page 3 of 9 '[]'$  - 0 Specimen Collection (cultures, biopsies, blood, body fluids, etc.) '[]'$  - 0 Specimen(s) / Culture(s) sent or taken to Lab for analysis '[]'$  - 0 Patient Transfer (multiple staff / [name] / Similar devices) '[]'$  - 0 Simple Staple / Suture removal (25 or less) '[]'$  - 0 Complex Staple / Suture removal (26 or more) '[]'$  - 0 Hypo / Hyperglycemic Management (close monitor of Blood Glucose) '[]'$  - 0 Ankle / Brachial Index (ABI) - [name] not check if billed separately X- 1 5 Vital Signs Has the patient been seen at the hospital within the last three years: Yes Total Score: 75 Level Of Care: New/Established - Level 2 Electronic Signature(s) Signed: 12/10/2022 4:59:32 PM By: Chase Coria RN Entered By: [name] on 12/09/2022 11:46:29 -------------------------------------------------------------------------------- Encounter Discharge Information Details Patient Name: Date of Service: [name] Chase Puente, [name] NA [name] 12/09/2022 11:15 A  M Medical Record Number: [id number] Patient Account Number: [id number] Date of Birth/Sex: Treating RN: [name] (56 y.o. [name] Primary Care Junell Cullifer: [name] Other Clinician: Referring Jericho Cieslik: Treating Saraphina Lauderbaugh/Extender: RO BSO N, MICHA EL [name] in Treatment: 87 Encounter Discharge Information Items Discharge Condition: Stable Ambulatory Status: Wheelchair Discharge Destination: Home Transportation: Private Auto Accompanied By: self Schedule Follow-up Appointment: Yes Clinical Summary of Care: Electronic Signature(s) Signed: 12/09/2022 11:47:11 AM By: Chase Coria RN Entered By: [name] on 12/09/2022 11:47:11 -------------------------------------------------------------------------------- Lower Extremity Assessment Details Patient Name: Date of ServiceMila [name] [facility], [name] NA [name] 12/09/2022 11:15 A MACSON HINGER, [name] ([id number]) [id number] Page 4 of 9 Medical Record Number: [id number] Patient Account Number: [id number] Date of Birth/Sex: Treating RN: [birth year] [name] (56 y.o. [name]) [name] Primary Care Marieelena Bartko: [name] Other Clinician: Referring Latoya Diskin: Treating Trasean Delima/Extender: RO BSO Delane Ginger, MICHA EL [name] in Treatment: 62 Electronic Signature(s) Signed: 12/09/2022 11:23:02 AM By: Chase Coria RN Entered By: [name] on 12/09/2022 11:23:02 -------------------------------------------------------------------------------- Multi Wound Chart Details Patient Name: Date of Service: [name] TH, [name] NA [name] 12/09/2022 11:15 A M Medical Record Number: [id number] Patient Account Number: [id number] Date of Birth/Sex: Treating RN: [birth year] (56 y.o. [name] Primary Care Ronel Rodeheaver: [name] Other Clinician: Referring Rebbeca Sheperd: Treating Emmogene Simson/Extender: RO BSO N, MICHA EL [name] in Treatment: 31 Vital Signs Height(in): 60 Pulse(bpm): 4 Weight(lbs): Blood  Pressure(mmHg): 97/68 Body Mass Index(BMI): Temperature(F): 97.5 Respiratory Rate(breaths/min): 18 [3:Photos:] [N/A:N/A] Right Ischial Tuberosity N/A N/A Wound Location: Gradually Appeared N/A N/A Wounding Event: Pressure Ulcer N/A N/A Primary Etiology: Cataracts, Glaucoma, Coronary Artery N/A N/A Comorbid History: Disease, Hypertension, History of pressure wounds, Osteoarthritis, Paraplegia 09/03/2021 N/A N/A Date Acquired: 16 N/A N/A Weeks of Treatment: Open N/A N/A Wound Status: No N/A N/A Wound  Recurrence: 6x3.5x2 N/A N/A Measurements L x W x D (cm) 16.493 N/A N/A A (cm) : rea 32.987 N/A N/A Volume (cm) : 6.30% N/A N/A % Reduction in A rea: 25.00% N/A N/A % Reduction in Volume: Category/Stage IV N/A N/A Classification: Medium N/A N/A Exudate A mount: Serosanguineous N/A N/A Exudate Type: red, brown N/A N/A Exudate Color: Large (67-100%) N/A N/A Granulation A mount: Red, Pink N/A N/A Granulation Quality: Small (1-33%) N/A N/A Necrotic A mount: Eschar, Adherent Slough N/A N/A Necrotic Tissue: Fat Layer (Subcutaneous Tissue): Yes N/A N/A Exposed Structures: MuscleJEMERY, UMSTEAD ([id number]) [id number] Page 5 of 9 Bone: Yes None N/A N/A Epithelialization: Treatment Notes Electronic Signature(s) Signed: 12/09/2022 11:23:17 AM By: Chase Coria RN Entered By: [name] on 12/09/2022 11:23:17 -------------------------------------------------------------------------------- Multi-Disciplinary Care Plan Details Patient Name: Date of Service: [name] TH, [name] NA [name] 12/09/2022 11:15 A M Medical Record Number: [id number] Patient Account Number: [id number] Date of Birth/Sex: Treating RN: [name] (56 y.o. [name]) [name] Primary Care Amaury Kuzel: [name] Other Clinician: Referring Gleen Ripberger: Treating Nyriah Coote/Extender: RO BSO N, [place] EL [name] in Treatment: 46 Active Inactive Pressure Nursing  Diagnoses: Knowledge deficit related to causes and risk factors for pressure ulcer development Knowledge deficit related to management of pressures ulcers Potential for impaired tissue integrity related to pressure, friction, moisture, and shear Goals: Patient will remain free from development of additional pressure ulcers Date Initiated: 01/15/2022 Target Resolution Date: 02/26/2022 Goal Status: Active Patient/caregiver will verbalize risk factors for pressure ulcer development Date Initiated: 01/15/2022 Target Resolution Date: 03/05/2022 Goal Status: Active Interventions: Assess: immobility, friction, shearing, incontinence upon admission and as needed Assess offloading mechanisms upon admission and as needed Assess potential for pressure ulcer upon admission and as needed Notes: Electronic Signature(s) Signed: 12/09/2022 11:23:35 AM By: Chase Coria RN Entered By: [name] on 12/09/2022 11:23:34 [name] ([id number]) [id number] Page 6 of 9 -------------------------------------------------------------------------------- Pain Assessment Details Patient Name: Date of ServiceMila [name] [facility], [name] NA [name] 12/09/2022 11:15 A M Medical Record Number: [id number] Patient Account Number: [id number] Date of Birth/Sex: Treating RN: [birth year] (56 y.o. [name]) [name] Primary Care Derrall Hicks: [name] Other Clinician: Referring Deric Bocock: Treating Florina Glas/Extender: RO BSO N, MICHA EL [name] in Treatment: 46 Active Problems Location of Pain Severity and Description of Pain Patient Has Paino No Site Locations Pain Management and Medication Current Pain Management: Electronic Signature(s) Signed: 12/10/2022 4:59:32 PM By: Chase Coria RN Entered By: [name] on 12/09/2022 11:21:26 -------------------------------------------------------------------------------- Patient/Caregiver Education Details Patient Name: Date of Service: [name]  TH, [name] NA [name] 3/7/2024andnbsp11:15 [place] Number: [id number] Patient Account Number: [id number] Date of Birth/Gender: Treating RN: [birth year] (56 y.o. [name] Primary Care Physician: [name] Other Clinician: Referring Physician: Treating Physician/Extender: Eldridge Dace, MICHA EL [name] in Treatment: [address] [place] ([id number]) [id number] Page 7 of 9 Education Assessment Education Provided To: Patient Education Topics Provided Pressure: Methods: Explain/Verbal Responses: State content correctly Electronic Signature(s) Signed: 12/10/2022 4:59:32 PM By: Chase Coria RN Entered By: [name] on 12/09/2022 11:23:48 -------------------------------------------------------------------------------- Wound Assessment Details Patient Name: Date of Service: [name] [facility], [name] NA [name] 12/09/2022 11:15 A M Medical Record Number: [id number] Patient Account Number: [id number] Date of Birth/Sex: Treating RN: [birth year] [name] (56 y.o. [name] Primary Care Yariela Tison: [name] Other Clinician: Referring Guiliana Shor: Treating Sheilyn Boehlke/Extender: RO BSO N, MICHA EL [name] in Treatment: 46 Wound Status Wound Number: 3 Primary Pressure Ulcer Etiology:  Wound Location: Right Ischial Tuberosity Wound Open Wounding Event: Gradually Appeared Status: Date Acquired: 09/03/2021 Comorbid Cataracts, Glaucoma, Coronary Artery Disease, Hypertension, Weeks Of Treatment: 46 History: History of pressure wounds, Osteoarthritis, Paraplegia Clustered Wound: No Photos Wound Measurements Length: (cm) 6 Width: (cm) 3.5 Depth: (cm) 2 Area: (cm) 16.493 Volume: (cm) 32.987 % Reduction in Area: 6.3% % Reduction in Volume: 25% Epithelialization: None Tunneling: No Undermining: No Wound Description Classification: Category/Stage IV Exudate Amount: Medium Exudate Type: Serosanguineous Exudate Color: red, brown DEMYAN, HERDEGEN ([id number]) Foul Odor After Cleansing: No Slough/Fibrino Yes [id number] Page 8 of 9 Wound Bed Granulation Amount: Large (67-100%) Exposed Structure Granulation Quality: Red, Pink Fat Layer (Subcutaneous Tissue) Exposed: Yes Necrotic Amount: Small (1-33%) Muscle Exposed: Yes Necrotic Quality: Eschar, Adherent Slough Necrosis of Muscle: Yes Bone Exposed: Yes Treatment Notes Wound #3 (Ischial Tuberosity) Wound Laterality: Right Cleanser [place] Ancillary Kit - 15 Chase Supply Discharge Instruction: Use supplies as instructed; Kit contains: (15) Saline Bullets; (15) 3x3 Gauze; 15 pr Gloves Normal Saline Discharge Instruction: Wash your hands with soap and water. Remove old dressing, discard into plastic bag and place into trash. Cleanse the wound with Normal Saline prior to applying a clean dressing using gauze sponges, not tissues or cotton balls. [name] not scrub or use excessive force. Pat dry using gauze sponges, not tissue or cotton balls. Wound Cleanser Discharge Instruction: Wash your hands with soap and water. Remove old dressing, discard into plastic bag and place into trash. Cleanse the wound with Wound Cleanser prior to applying a clean dressing using gauze sponges, not tissues or cotton balls. [name] not scrub or use excessive force. Pat dry using gauze sponges, not tissue or cotton balls. Peri-Wound Care Topical Primary Dressing Gauze Discharge Instruction: moistened with dakins Secondary Dressing (BORDER) Zetuvit Plus SILICONE BORDER Dressing 4x4 (in/in) Discharge Instruction: Please [name] not put silicone bordered dressings under wraps. Use non-bordered dressing only. Secured With Compression Wrap Compression Stockings Environmental education officer) Signed: 12/09/2022 11:22:39 AM By: Chase Coria RN Entered By: [name] on 12/09/2022 11:22:39 -------------------------------------------------------------------------------- Vitals  Details Patient Name: Date of Service: [name] TH, [name] NA [name] 12/09/2022 11:15 A M Medical Record Number: [id number] Patient Account Number: [id number] Date of Birth/Sex: Treating RN: [birth year][name] (56 y.o. [name]) [name] Primary Care Laylia Mui: [name] Other Clinician: Referring Nichola Cieslinski: Treating Rennie Rouch/Extender: RO BSO N, MICHA EL [name] in Treatment: 46 Vital Signs Time Taken: 11:10 Temperature (F): 97.5 Height (in): 60 Pulse (bpm): 98 Respiratory Rate (breaths/min): 18 Blood Pressure (mmHg): 97/68 EMRAH, CORRIEA ([id number]) [id number] Page 9 of 9 Reference Range: 80 - 120 mg / dl Electronic Signature(s) Signed: 12/09/2022 11:21:18 AM By: Chase Coria RN Entered By: [name] on 12/09/2022 11:21:18

## 2022-12-11 NOTE — Progress Notes (Signed)
[place], [name] ([id number]) [id number] Page 1 of 8 Visit Report for 12/09/2022 HPI Details Patient Name: Date of Service: [name] [facility], [name] 12/09/2022 11:15 A M Medical Record Number: [id number] Patient Account Number: [id number] Date of Birth/Sex: Treating RN: 1966-08-08 (56 y.o. [name]) Carlene Coria Primary Care Provider: Fulton Reek Other Clinician: Referring Provider: Treating Provider/Extender: RO BSO N, MICHA EL Ginny Forth in Treatment: 63 History of Present Illness HPI Description: 57 year old male with a history of spina bifida presenting to [place] with a history of a open wound on the left gluteal region near his upper thigh which she's had for several weeks. He was seen in the ED recently at [facility] and was put on doxycycline and was advised some local care. his past medical history is significant for spinal bifid, neurogenic bladder, kidney stones, sacral pressure sores, constipation. Past surgical history significant for partial cystectomy, ileal conduit, VP shunt removal, percutaneous nephro lithotripsy. In the remote past the patient says he's had some treatment for a ulcer on this area and was treated with a skin graft. Readmission: 10/16/2021 upon evaluation today patient appears to be doing somewhat poorly in regard to a fairly large wound noted over the right ischial tuberosity location. This is a stage IV pressure ulcer. Of note this is also positive for osteomyelitis upon a CT scan which was performed during the time that he was admitted in the hospital on 09/19/2021. With that being said it is noted in the right inferior buttock that he has a decubitus ulcer which extends to the posterior toe inferior right ischium where there is evidence of osteomyelitis. When he was here in the office actually missed that this was positive I missed read it and thought it said that there was no osteomyelitis. That is not the case there  is in fact osteomyelitis noted here. I actually did review his notes as well in epic. Looking to the discharge summary it appears that the patient was admitted from 09/19/2021 through 09/23/2021. He was discharged home with home health care according to the note. With that being said from what I understand his sister actually takes care of him at this point. He was also to follow-up with a primary care provider and here at the wound care center within a week. I am just now seeing him on the 13th almost a month after discharge. He does have a history of again osteomyelitis of this right hip/ischial tuberosity location. 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 The reason for his admission was actually worsening of the decubitus ulcer upon admission. He does have chronic osteomyelitis of this area. He was treated with empiric antibiotics he had acute kidney injury which improved with IV fluids he also had hypokalemia. Surgical debridement was performed by general surgery at that time and ID was consulted to assist with management according to the notes. IV antibiotics were recommended but patient declined and wanted oral antibiotics at discharge. Therefore no IV antibiotics were planned for discharge time. This case was under the care of Dr. Steva Ready while he was in the hospital when she did discharge him with a 2-week course of doxycycline and Augmentin according to the notes. It looks like Dr. Doy Hutching office did call and leave a message for the patient to get scheduled for a follow-up visit  after hospital discharge I do not see where he showed up in fact it appears that the patient has a no-show letter from Dr. Doy Hutching office noted in epic due to a appointment that was missed on 10/06/2021. Looking at the pictures from Dr. Raelene Bott notes on 09/23/2021  it does appear that the wound is a little bit better as far as the cleanliness of the surface of the wound today but nonetheless still is a very significant wound. 10/26/2021 upon evaluation today patient appears to be doing okay in regard to his wound. Again this is a wound that he did indeed have osteomyelitis and previous. With that being said I do not see any signs of a worsening infection at this time which is good news. Overall I think that we are headed in the right direction although still I think he needs more aggressive offloading. 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 I do believe this would be ideal for him to be honest. Readmission: 01-15-2022 upon evaluation today patient presents for reevaluation he was last seen on October 26, 2021. At that time unfortunately he was having issues with a significant ischial pressure ulcer on the right ischial tuberosity. Subsequently he ended up having decent response to the Dakin's moistened gauze dressing that was previously being utilized and subsequently the patient's sister who is his primary caregiver as well states that she decided just to try to manage this at home. Nonetheless unfortunately this is getting a little larger not better. He does spend a lot of the day in his chair. He does have an air mattress according what they tell me today. That was previously ordered. With that being said I do believe that at this time things do appear to be doing much better from the standpoint of infection I do not see any signs of overt infection. There is also no evidence of systemic infection. No fevers, chills, nausea, vomiting, or diarrhea. With that being said I do believe based on what I am seeing the wound is a bit cleaner and he possibly could be an excellent candidate for a wound VAC. 02-05-2022 upon evaluation today patient appears to be doing about the same in regard to his wound. Unfortunately he is not any better but he  also really has not had the wound VAC on sufficiently. There is been some confusion with the orders part of that I think is our follow-up with the way the order was written part of it may be with how the wound VAC is being placed either way we need to see what we can do to try to improve things in this regard. We will clarify the orders today going forward for home health. Unfortunately the patient does have a new wound today which is on the opposite side. This is not good 1 was bad enough have another area to open is definitely not a good thing. Its not as deep but nonetheless is also not very shallow either. We may potentially be able to get this to the point that we could bridge the 2 together in order to wound VAC both but right now want a focus on to try to get the wound VAC going for the main wound initially we will likely use Hydrofera Blue on the new wound.Marland Kitchen 02-19-2022 upon evaluation today patient's wound appears to be doing a little bit worse in the way of maceration fortunately there is no signs of active infection locally or systemically which  is great news. No fevers, chills, nausea, vomiting, or diarrhea. [place], [name] ([id number]) [id number] Page 2 of 8 03-05-2022 upon evaluation today patient unfortunately does not appear to be doing nearly as good in regard to his wounds as I would like to see. Fortunately I do not see any evidence of active infection that is obvious although I am can obtain a wound culture to ensure that there is not anything getting worse here as far as the wound without the wound VAC on his concern. With that being said I will go ahead and get this sent in and evaluated will initiate treatment as needed going forward if necessary. With regard to the wound with the wound VAC this continues to be placed directly over the wound and there is obvious signs of deep tissue injury. The suction being here means that he is sitting on it I think  this may be part of the reason why it is coming off although not [id number] certain. 03-12-2022 upon evaluation today patient still has not gotten the antibiotics that were called in for him 4 days ago. He tells me that his sister just told him this morning that they were ready but that she has not had a chance to go pick them up. Nonetheless I feel like that this is really something he needs any should have been taken that not the other antibiotics which were not doing the job for him to be honest. The patient states and I completely agree that there is really no way he would have known relying on his sister to let him know obviously. Nonetheless I do believe that he needs to not be taking any other antibiotics and be on the Levaquin as soon as possible. He tells me that she said she was going to pick them up today. In regard to home health I am hopeful that they actually have been bridging this now. I do think that the wound looks better and this is good news. With that being said I am not certain that I can really tell for sure how this was attached it was removed before he came in and the black foam left in place. I really want him to leave the wound VAC in place until he comes in so that we can monitor and make sure that this is being done correctly he voiced understanding. 03-19-2022 upon evaluation today and much happier with how things appear as far as the patient's wounds are concerned. I think he is on a much better track and they are doing a better job at bridging although its not quite where I like to see it is still more posterior which I think the patient needs to be a little bit more lateral on his side. Either way this is something that I did marked with a small dot today for him to tell the home health nurses well so she can bridge it to that location also think the tubing should go up instead of going down to just make it less cumbersome overall for him. 04-19-2022 upon evaluation today patient  appears to be doing a little better in regard to his wounds. Fortunately I do not see any signs of infection I do believe he is on the right track. The wound VAC does seem to be helping to clean the wound up a bit and bring it in from the base at work. This is good news. 05-10-2022 upon evaluation today patient appears to be doing well currently in regard  to his wounds in general in fact of the smaller of the 2 wounds without the wound VAC actually appears to be doing so much better this is almost completely closed and very pleased in that regard. He has been staying off of this he tells me and shows. With that being said the wound with the wound VAC is going require some sharp debridement there is some tissue which is not quite as healthy and we are going to go ahead and continue that for probably 2 more weeks although I think we may be closer to discontinuing this as well since it has healed and quite significantly. 05-24-2022 upon evaluation today patient appears to be doing well with regard to his wound in fact this looks better than I have been expected. This actually I think is a good time to go ahead and switch away from the wound VAC based on what I am seeing. I think we go to [facility] with a bordered foam dressing. 06-21-2022 upon evaluation today patient appears to be doing well currently in regard to his wound this is measuring smaller and looks well. Fortunately there does not appear to be any signs of active infection locally or systemically at this time which is great news. No fevers, chills, nausea, vomiting, or diarrhea. 07-29-2022 upon evaluation today patient appears to be doing well currently in regard to his wound. This is actually measuring smaller although there is a lot of hypergranulation noted. Fortunately there does not appear to be any signs of active infection locally or systemically at this time. No fevers, chills, nausea, vomiting, or diarrhea. 11/9; pressure ulcer on  the right buttock in the setting of spina bifida and lower extremity paralysis. He has been using [facility] 09-02-2022 upon evaluation today patient appears to be doing well currently in regard to his wound. We have been using Hydrofera Blue this is showing signs of improvement as far as getting smaller is concerned. There is some drainage around the edges of the wound but I think a lot of this is due to the fact he did not even have a dressing on that he came in today. 12/29; right buttock pressure area which was initially stage IV. This is in the setting of a patient with spina bifida spends most of his time in a wheelchair. I am not certain how much he is offloading this. He has been using for [facility] as a primary dressing. When he was here the last visit he was felt to have hyper granulated tissue and was treated with silver nitrate chemical cauterization 10-14-2022 upon evaluation today patient appears to be doing poorly in regard to the wound in the right ischial location. Unfortunately he tells me that the dressing seems to be falling off frequently. Unfortunately I think that this has contributed to him developing an infection which appears to be exemplified by the fact that he has bone exposed which is actually necrotic and working its way out of the wound up and have to perform a fairly significant debridement today it does appear. I think that he needs to have these dressings ensure that they are in place at all times. Also discussed with the patient that we will get a need to do some testing in order to see if he indeed has an infection. If he does have a bone infection which I think is pretty likely to be osteomyelitis of this ischial location. I discussed that with the patient in detail today and depending  on what we see him and make any adjustments in care as far as going forward is concerned. Obviously depressed antibiotic that we gave him that he tells me was somewhat  irritating as far as the stomach side effects are concerned. He states he really would not want to be on that again. Either way I feel he is probably going to be placed on something once we get the results of the culture back. I am honestly concerned about the fact that he comes into the clinic without any dressings in place I am not sure how often they are really staying where they need to be as far as this wound is concerned. I actually cannot remember seeing him anytime in the past 2 months or so at least that he came in with a dressing intact. 10-21-2022 upon evaluation today patient presents for initial inspection here in our clinic concerning issues that he has been having with a wound in the sacral area. Again last time he was here I did debride away necrotic bone which was confirmed on pathology although it was not necessarily confirmed that this was secondary to osteomyelitis on the pathology report. Also the x-ray did not neither confirm nor deny the presence of osteomyelitis and his culture unfortunately showed multiple organisms with nothing predominance of this does not help to rule and tailor down exactly what is going on here. Fortunately I do not see any signs of active infection systemically which is great news. With that being said locally I am definitely seeing some issues here. In fact this is warm to touch around the sacral area and I think that cellulitis is definitely present I just cannot pinpoint the exact organism. With that being said I did discuss with the patient as well today that I do believe that he probably needs an MRI in order to further quantify the extent of her likely osteomyelitis and this again is something that we are going to order this will need to be without contrast due to the fact that he is allergic to contrast dye. 11-18-2022 upon evaluation today patient's wound is actually showing some signs of improvement currently. We have been doing the Dakin's  moistened gauze dressing which I think is helping to clean up the wound for seeing some granulation growing and still the tissue is not quite as healthy as I would love to see but better than previous. He does have osteomyelitis but the good news is he seems to be treated well with the antibiotics he does have a refill that was just picked up and I think that he needs to continue to take that for certain. 3/7; patient is on a 6-week clinic hiatus. He lives at home with his sister however she is not at home all the time. He transfers out of bed and a sliding board. He claims he is only in the in the wheelchair about 4 5 hours. He does have underlying osteomyelitis I am not sure about the status of the antibiotics here. Part of the wound still easily probes to bone Electronic Signature(s) Signed: 12/09/2022 4:44:08 PM By: Linton Ham MD Entered By: Linton Ham on 12/09/2022 [id number] [name] ([id number]) [id number] Page 3 of 8 -------------------------------------------------------------------------------- Physical Exam Details Patient Name: Date of ServiceMila [name] [facility], [name] 12/09/2022 11:15 A M Medical Record Number: [id number] Patient Account Number: [id number] Date of Birth/Sex: Treating RN: 06-12-1966 (56 y.o. [name] Primary Care Provider: Fulton Reek Other Clinician: Referring Provider: Treating Provider/Extender:  RO BSO N, MICHA EL G Sparks, Jeffrey Weeks in Treatment: 46 Constitutional Patient is hypotensive.However he does not look unwell. Pulse regular and within target range for patient.Marland Kitchen Respirations regular, non-labored and within target range.. Temperature is normal and within the target range for the patient.Marland Kitchen appears in no distress. Notes 1. The patient's wound bed does not look too bad however at 3-6 o'clock there is a probing tunnel down to bone. Apparently this is not new. No evidence of infection. We are using  Dakin's wet-to-dry. Electronic Signature(s) Signed: 12/09/2022 4:44:08 PM By: Linton Ham MD Entered By: Linton Ham on 12/09/2022 12:10:57 -------------------------------------------------------------------------------- Physician Orders Details Patient Name: Date of Service: [name] [facility], [name] 12/09/2022 11:15 A M Medical Record Number: [id number] Patient Account Number: [id number] Date of Birth/Sex: Treating RN: 1965-10-31 (56 y.o. [name] Primary Care Provider: Fulton Reek Other Clinician: Referring Provider: Treating Provider/Extender: RO BSO N, MICHA EL Ginny Forth in Treatment: [phone] Verbal / Phone Orders: No Diagnosis Coding Follow-up Appointments Return Appointment in 1 week. Bathing/ Shower/ Hygiene Clean wound with Normal Saline or wound cleanser. - keep dressing dry or change after shower No tub bath. Anesthetic (Use 'Patient Medications' Section for Anesthetic Order Entry) Lidocaine applied to wound bed Off-Loading Gel wheelchair cushion Hospital bed/mattress - Has at home A fluidized (Group 3) - Has from [place] and reposition every 2 hours - Do not sit in wheelchair any longer than you have to--offload wound in bed as much as possible Other: - avoid sliding which causes friction as much as you can JEWLIAN, LECONTE ([id number]) [id number] Page 4 of 8 Additional Orders / Instructions Follow Nutritious Diet and Increase Protein Intake Wound Treatment Wound #3 - Ischial Tuberosity Wound Laterality: Right Cleanser: Byram Ancillary Kit - 15 Day Supply (Generic) 3 x Per Week/30 Days Discharge Instructions: Use supplies as instructed; Kit contains: (15) Saline Bullets; (15) 3x3 Gauze; 15 pr Gloves Cleanser: Normal Saline 3 x Per Week/30 Days Discharge Instructions: Wash your hands with soap and water. Remove old dressing, discard into plastic bag and place into trash. Cleanse the wound with Normal Saline  prior to applying a clean dressing using gauze sponges, not tissues or cotton balls. Do not scrub or use excessive force. Pat dry using gauze sponges, not tissue or cotton balls. Cleanser: Wound Cleanser 3 x Per Week/30 Days Discharge Instructions: Wash your hands with soap and water. Remove old dressing, discard into plastic bag and place into trash. Cleanse the wound with Wound Cleanser prior to applying a clean dressing using gauze sponges, not tissues or cotton balls. Do not scrub or use excessive force. Pat dry using gauze sponges, not tissue or cotton balls. Prim Dressing: Gauze (Generic) 3 x Per Week/30 Days ary Discharge Instructions: moistened with dakins Secondary Dressing: (BORDER) Zetuvit Plus SILICONE BORDER Dressing 4x4 (in/in) 3 x Per Week/30 Days Discharge Instructions: Please do not put silicone bordered dressings under wraps. Use non-bordered dressing only. Electronic Signature(s) Signed: 12/09/2022 11:46:02 AM By: Carlene Coria RN Signed: 12/09/2022 4:44:08 PM By: Linton Ham MD Entered By: Carlene Coria on 12/09/2022 11:46:02 -------------------------------------------------------------------------------- Problem List Details Patient Name: Date of Service: [name] TH, [name] 12/09/2022 11:15 A M Medical Record Number: [id number] Patient Account Number: [id number] Date of Birth/Sex: Treating RN: 03/14/1966 (56 y.o. [name] Primary Care Provider: Fulton Reek Other Clinician: Referring Provider: Treating Provider/Extender: RO BSO N, MICHA EL Ginny Forth in Treatment: [phone] Active Problems ICD-10 Encounter Code  Description Active Date MDM Diagnosis L89.314 Pressure ulcer of right buttock, stage 4 01/15/2022 No Yes Q76.0 Spina bifida occulta 01/15/2022 No Yes Z93.3 Colostomy status 01/15/2022 No Yes I10 Essential (primary) hypertension 01/15/2022 No Yes I25.10 Atherosclerotic heart disease of native coronary artery without angina pectoris 01/15/2022 No  Yes [name], [name] ([id number]) [id number] Page 5 of 8 Inactive Problems ICD-10 Code Description Active Date Inactive Date L89.323 Pressure ulcer of left buttock, stage 3 02/05/2022 02/05/2022 Resolved Problems Electronic Signature(s) Signed: 12/09/2022 4:44:08 PM By: Linton Ham MD Entered By: Linton Ham on 12/09/2022 12:04:21 -------------------------------------------------------------------------------- Progress Note Details Patient Name: Date of Service: [name] TH, [name] 12/09/2022 11:15 A M Medical Record Number: [id number] Patient Account Number: [id number] Date of Birth/Sex: Treating RN: 10-28-1965 (56 y.o. [name]) Carlene Coria Primary Care Provider: Fulton Reek Other Clinician: Referring Provider: Treating Provider/Extender: RO BSO N, MICHA EL Ginny Forth in Treatment: 46 Subjective History of Present Illness (HPI) 57 year old male with a history of spina bifida presenting to [place] with a history of a open wound on the left gluteal region near his upper thigh which she's had for several weeks. He was seen in the ED recently at [facility] and was put on doxycycline and was advised some local care. his past medical history is significant for spinal bifid, neurogenic bladder, kidney stones, sacral pressure sores, constipation. Past surgical history significant for partial cystectomy, ileal conduit, VP shunt removal, percutaneous nephro lithotripsy. In the remote past the patient says he's had some treatment for a ulcer on this area and was treated with a skin graft. Readmission: 10/16/2021 upon evaluation today patient appears to be doing somewhat poorly in regard to a fairly large wound noted over the right ischial tuberosity location. This is a stage IV pressure ulcer. Of note this is also positive for osteomyelitis upon a CT scan which was performed during the time that he was admitted in the hospital on 09/19/2021. With  that being said it is noted in the right inferior buttock that he has a decubitus ulcer which extends to the posterior toe inferior right ischium where there is evidence of osteomyelitis. When he was here in the office actually missed that this was positive I missed read it and thought it said that there was no osteomyelitis. That is not the case there is in fact osteomyelitis noted here. I actually did review his notes as well in epic. Looking to the discharge summary it appears that the patient was admitted from 09/19/2021 through 09/23/2021. He was discharged home with home health care according to the note. With that being said from what I understand his sister actually takes care of him at this point. He was also to follow-up with a primary care provider and here at the wound care center within a week. I am just now seeing him on the 13th almost a month after discharge. He does have a history of again osteomyelitis of this right hip/ischial tuberosity location. 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 The reason for his admission was actually worsening of the decubitus ulcer upon admission. He does have chronic osteomyelitis of this area. He was treated with empiric antibiotics he had acute kidney injury which improved with IV fluids he also had hypokalemia. Surgical debridement was performed by general surgery at that time and  ID was consulted to assist with management according to the notes. IV antibiotics were recommended but patient declined and wanted oral antibiotics at discharge. Therefore no IV antibiotics were planned for discharge time. This case was under the care of Dr. Steva Ready while he was in the hospital when she did discharge him with a 2-week course of doxycycline and Augmentin according to the notes. It looks like Dr. Doy Hutching  office did call and leave a message for the patient to get scheduled for a follow-up visit after hospital discharge I do not see where he showed up in fact it appears that the patient has a no-show letter from Dr. Doy Hutching office noted in epic due to a appointment that was missed on 10/06/2021. Looking at the pictures from Dr. Raelene Bott notes on 09/23/2021 it does appear that the wound is a little bit better as far as the cleanliness of the surface of the wound today but nonetheless still is a very significant wound. 10/26/2021 upon evaluation today patient appears to be doing okay in regard to his wound. Again this is a wound that he did indeed have osteomyelitis and previous. With that being said I do not see any signs of a worsening infection at this time which is good news. Overall I think that we are headed in the right direction although still I think he needs more aggressive offloading. 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 I do believe this would be ideal for him to be honest. Readmission: 01-15-2022 upon evaluation today patient presents for reevaluation he was last seen on October 26, 2021. At that time unfortunately he was having issues with [name], [name] ([id number]) [id number] Page 6 of 8 a significant ischial pressure ulcer on the right ischial tuberosity. Subsequently he ended up having decent response to the Dakin's moistened gauze dressing that was previously being utilized and subsequently the patient's sister who is his primary caregiver as well states that she decided just to try to manage this at home. Nonetheless unfortunately this is getting a little larger not better. He does spend a lot of the day in his chair. He does have an air mattress according what they tell me today. That was previously ordered. With that being said I do believe that at this time things do appear to be doing much better from the standpoint of  infection I do not see any signs of overt infection. There is also no evidence of systemic infection. No fevers, chills, nausea, vomiting, or diarrhea. With that being said I do believe based on what I am seeing the wound is a bit cleaner and he possibly could be an excellent candidate for a wound VAC. 02-05-2022 upon evaluation today patient appears to be doing about the same in regard to his wound. Unfortunately he is not any better but he also really has not had the wound VAC on sufficiently. There is been some confusion with the orders part of that I think is our follow-up with the way the order was written part of it may be with how the wound VAC is being placed either way we need to see what we can do to try to improve things in this regard. We will clarify the orders today going forward for home health. Unfortunately the patient does have a new wound today which is on the opposite side. This is not good 1 was bad enough have another area to open is definitely not a good thing. Its not  as deep but nonetheless is also not very shallow either. We may potentially be able to get this to the point that we could bridge the 2 together in order to wound VAC both but right now want a focus on to try to get the wound VAC going for the main wound initially we will likely use Hydrofera Blue on the new wound.Marland Kitchen 02-19-2022 upon evaluation today patient's wound appears to be doing a little bit worse in the way of maceration fortunately there is no signs of active infection locally or systemically which is great news. No fevers, chills, nausea, vomiting, or diarrhea. 03-05-2022 upon evaluation today patient unfortunately does not appear to be doing nearly as good in regard to his wounds as I would like to see. Fortunately I do not see any evidence of active infection that is obvious although I am can obtain a wound culture to ensure that there is not anything getting worse here as far as the wound without the wound  VAC on his concern. With that being said I will go ahead and get this sent in and evaluated will initiate treatment as needed going forward if necessary. With regard to the wound with the wound VAC this continues to be placed directly over the wound and there is obvious signs of deep tissue injury. The suction being here means that he is sitting on it I think this may be part of the reason why it is coming off although not [id number] certain. 03-12-2022 upon evaluation today patient still has not gotten the antibiotics that were called in for him 4 days ago. He tells me that his sister just told him this morning that they were ready but that she has not had a chance to go pick them up. Nonetheless I feel like that this is really something he needs any should have been taken that not the other antibiotics which were not doing the job for him to be honest. The patient states and I completely agree that there is really no way he would have known relying on his sister to let him know obviously. Nonetheless I do believe that he needs to not be taking any other antibiotics and be on the Levaquin as soon as possible. He tells me that she said she was going to pick them up today. In regard to home health I am hopeful that they actually have been bridging this now. I do think that the wound looks better and this is good news. With that being said I am not certain that I can really tell for sure how this was attached it was removed before he came in and the black foam left in place. I really want him to leave the wound VAC in place until he comes in so that we can monitor and make sure that this is being done correctly he voiced understanding. 03-19-2022 upon evaluation today and much happier with how things appear as far as the patient's wounds are concerned. I think he is on a much better track and they are doing a better job at bridging although its not quite where I like to see it is still more posterior which I think  the patient needs to be a little bit more lateral on his side. Either way this is something that I did marked with a small dot today for him to tell the home health nurses well so she can bridge it to that location also think the tubing should go up instead of  going down to just make it less cumbersome overall for him. 04-19-2022 upon evaluation today patient appears to be doing a little better in regard to his wounds. Fortunately I do not see any signs of infection I do believe he is on the right track. The wound VAC does seem to be helping to clean the wound up a bit and bring it in from the base at work. This is good news. 05-10-2022 upon evaluation today patient appears to be doing well currently in regard to his wounds in general in fact of the smaller of the 2 wounds without the wound VAC actually appears to be doing so much better this is almost completely closed and very pleased in that regard. He has been staying off of this he tells me and shows. With that being said the wound with the wound VAC is going require some sharp debridement there is some tissue which is not quite as healthy and we are going to go ahead and continue that for probably 2 more weeks although I think we may be closer to discontinuing this as well since it has healed and quite significantly. 05-24-2022 upon evaluation today patient appears to be doing well with regard to his wound in fact this looks better than I have been expected. This actually I think is a good time to go ahead and switch away from the wound VAC based on what I am seeing. I think we go to [facility] with a bordered foam dressing. 06-21-2022 upon evaluation today patient appears to be doing well currently in regard to his wound this is measuring smaller and looks well. Fortunately there does not appear to be any signs of active infection locally or systemically at this time which is great news. No fevers, chills, nausea, vomiting, or  diarrhea. 07-29-2022 upon evaluation today patient appears to be doing well currently in regard to his wound. This is actually measuring smaller although there is a lot of hypergranulation noted. Fortunately there does not appear to be any signs of active infection locally or systemically at this time. No fevers, chills, nausea, vomiting, or diarrhea. 11/9; pressure ulcer on the right buttock in the setting of spina bifida and lower extremity paralysis. He has been using [facility] 09-02-2022 upon evaluation today patient appears to be doing well currently in regard to his wound. We have been using Hydrofera Blue this is showing signs of improvement as far as getting smaller is concerned. There is some drainage around the edges of the wound but I think a lot of this is due to the fact he did not even have a dressing on that he came in today. 12/29; right buttock pressure area which was initially stage IV. This is in the setting of a patient with spina bifida spends most of his time in a wheelchair. I am not certain how much he is offloading this. He has been using for [facility] as a primary dressing. When he was here the last visit he was felt to have hyper granulated tissue and was treated with silver nitrate chemical cauterization 10-14-2022 upon evaluation today patient appears to be doing poorly in regard to the wound in the right ischial location. Unfortunately he tells me that the dressing seems to be falling off frequently. Unfortunately I think that this has contributed to him developing an infection which appears to be exemplified by the fact that he has bone exposed which is actually necrotic and working its way out of the wound  up and have to perform a fairly significant debridement today it does appear. I think that he needs to have these dressings ensure that they are in place at all times. Also discussed with the patient that we will get a need to do some testing in order to see  if he indeed has an infection. If he does have a bone infection which I think is pretty likely to be osteomyelitis of this ischial location. I discussed that with the patient in detail today and depending on what we see him and make any adjustments in care as far as going forward is concerned. Obviously depressed antibiotic that we gave him that he tells me was somewhat irritating as far as the stomach side effects are concerned. He states he really would not want to be on that again. Either way I feel he is probably going to be placed on something once we get the results of the culture back. I am honestly concerned about the fact that he comes into the clinic without any dressings in place I am not sure how often they are really staying where they need to be as far as this wound is concerned. I actually cannot remember seeing him anytime in the past 2 months or so at least that he came in with a dressing intact. 10-21-2022 upon evaluation today patient presents for initial inspection here in our clinic concerning issues that he has been having with a wound in the sacral area. Again last time he was here I did debride away necrotic bone which was confirmed on pathology although it was not necessarily confirmed that this was secondary to osteomyelitis on the pathology report. Also the x-ray did not neither confirm nor deny the presence of osteomyelitis and his culture unfortunately showed multiple organisms with nothing predominance of this does not help to rule and tailor down exactly what is going on here. Fortunately I do not see any signs of active infection systemically which is great news. With that being said locally I am definitely seeing some issues here. In fact this is warm to touch around the sacral area and I think that cellulitis is definitely present I just cannot pinpoint the exact organism. With that being said I did discuss with the patient as well today that I do believe that he  probably needs an MRI in order to further quantify the extent of her likely osteomyelitis and this again is something that we are going to order this will need to be without contrast due to the fact that he is allergic to contrast dye. Weems, [name] ([id number]) [id number] Page 7 of 8 11-18-2022 upon evaluation today patient's wound is actually showing some signs of improvement currently. We have been doing the Dakin's moistened gauze dressing which I think is helping to clean up the wound for seeing some granulation growing and still the tissue is not quite as healthy as I would love to see but better than previous. He does have osteomyelitis but the good news is he seems to be treated well with the antibiotics he does have a refill that was just picked up and I think that he needs to continue to take that for certain. 3/7; patient is on a 6-week clinic hiatus. He lives at home with his sister however she is not at home all the time. He transfers out of bed and a sliding board. He claims he is only in the in the wheelchair about 4 5 hours. He does have underlying  osteomyelitis I am not sure about the status of the antibiotics here. Part of the wound still easily probes to bone Objective Constitutional Patient is hypotensive.However he does not look unwell. Pulse regular and within target range for patient.Marland Kitchen Respirations regular, non-labored and within target range.. Temperature is normal and within the target range for the patient.Marland Kitchen appears in no distress. Vitals Time Taken: 11:10 AM, Height: 60 in, Temperature: 97.5 F, Pulse: 98 bpm, Respiratory Rate: 18 breaths/min, Blood Pressure: 97/68 mmHg. General Notes: 1. The patient's wound bed does not look too bad however at 3-6 o'clock there is a probing tunnel down to bone. Apparently this is not new. No evidence of infection. We are using Dakin's wet-to-dry. Integumentary (Hair, Skin) Wound #3 status is Open. Original  cause of wound was Gradually Appeared. The date acquired was: 09/03/2021. The wound has been in treatment 46 weeks. The wound is located on the Right Ischial Tuberosity. The wound measures 6cm length x 3.5cm width x 2cm depth; 16.493cm^2 area and 32.987cm^3 volume. There is bone, muscle, and Fat Layer (Subcutaneous Tissue) exposed. There is no tunneling or undermining noted. There is a medium amount of serosanguineous drainage noted. There is large (67-100%) red, pink granulation within the wound bed. There is a small (1-33%) amount of necrotic tissue within the wound bed including Eschar, Adherent Slough and Necrosis of Muscle. Assessment Active Problems ICD-10 Pressure ulcer of right buttock, stage 4 Spina bifida occulta Colostomy status Essential (primary) hypertension Atherosclerotic heart disease of native coronary artery without angina pectoris Plan Follow-up Appointments: Return Appointment in 1 week. Bathing/ Shower/ Hygiene: Clean wound with Normal Saline or wound cleanser. - keep dressing dry or change after shower No tub bath. Anesthetic (Use 'Patient Medications' Section for Anesthetic Order Entry): Lidocaine applied to wound bed Off-Loading: Gel wheelchair cushion Hospital bed/mattress - Has at home Air fluidized (Group 3) - Has from [place] and reposition every 2 hours - Do not sit in wheelchair any longer than you have to--offload wound in bed as much as possible Other: - avoid sliding which causes friction as much as you can Additional Orders / Instructions: Follow Nutritious Diet and Increase Protein Intake WOUND #3: - Ischial Tuberosity Wound Laterality: Right Cleanser: Byram Ancillary Kit - 15 Day Supply (Generic) 3 x Per Week/30 Days Discharge Instructions: Use supplies as instructed; Kit contains: (15) Saline Bullets; (15) 3x3 Gauze; 15 pr Gloves Cleanser: Normal Saline 3 x Per Week/30 Days Discharge Instructions: Wash your hands with soap and water. Remove old  dressing, discard into plastic bag and place into trash. Cleanse the wound with Normal Saline prior to applying a clean dressing using gauze sponges, not tissues or cotton balls. Do not scrub or use excessive force. Pat dry using gauze sponges, not tissue or cotton balls. Cleanser: Wound Cleanser 3 x Per Week/30 Days Discharge Instructions: Wash your hands with soap and water. Remove old dressing, discard into plastic bag and place into trash. Cleanse the wound with Wound Cleanser prior to applying a clean dressing using gauze sponges, not tissues or cotton balls. Do not scrub or use excessive force. Pat dry using gauze sponges, not tissue or cotton balls. Prim Dressing: Gauze (Generic) 3 x Per Week/30 Days ary Discharge Instructions: moistened with dakins Secondary Dressing: (BORDER) Zetuvit Plus SILICONE BORDER Dressing 4x4 (in/in) 3 x Per Week/30 Days Discharge Instructions: Please do not put silicone bordered dressings under wraps. Use non-bordered dressing only. [name], [name] ([id number]) [id number] Page 8 of 8 1. #1 we are using  Dakin's with border gauze 2. I am not sure what the expectation of healing is although it seems that he is making an effort to maintain pressure offloading. 3. I am not sure where we are with antibiotics. Electronic Signature(s) Signed: 12/09/2022 4:44:08 PM By: Linton Ham MD Entered By: Linton Ham on 12/09/2022 12:12:48 -------------------------------------------------------------------------------- SuperBill Details Patient Name: Date of Service: [name] [place], [name] 12/09/2022 Medical Record Number: [id number] Patient Account Number: [id number] Date of Birth/Sex: Treating RN: [birth year] (56 y.o. [name]) Carlene Coria Primary Care Provider: Fulton Reek Other Clinician: Referring Provider: Treating Provider/Extender: RO BSO N, MICHA EL Ginny Forth in Treatment: 46 Diagnosis Coding ICD-10 Codes Code  Description L89.314 Pressure ulcer of right buttock, stage 4 Q76.0 Spina bifida occulta Z93.3 Colostomy status I10 Essential (primary) hypertension I25.10 Atherosclerotic heart disease of native coronary artery without angina pectoris Facility Procedures : CPT4 Code: [id number] Description: [id number] - WOUND CARE VISIT-LEV 2 EST PT Modifier: Quantity: 1 Physician Procedures : CPT4 Code Description Modifier [id number] - WC PHYS LEVEL 3 - EST PT ICD-10 Diagnosis Description L89.314 Pressure ulcer of right buttock, stage 4 Q76.0 Spina bifida occulta Quantity: 1 Electronic Signature(s) Signed: 12/09/2022 4:44:08 PM By: Linton Ham MD Previous Signature: 12/09/2022 11:46:35 AM Version By: Carlene Coria RN Entered By: Linton Ham on 12/09/2022 12:13:12

## 2022-12-23 ENCOUNTER — Encounter: Payer: 59 | Admitting: Physician Assistant

## 2022-12-23 DIAGNOSIS — L89314 Pressure ulcer of right buttock, stage 4: Secondary | ICD-10-CM | POA: Diagnosis not present

## 2022-12-24 NOTE — Progress Notes (Addendum)
[name], [name] ([id number]) [id number] Page 1 of 8 Visit Report for 12/23/2022 Arrival Information Details Patient Name: Date of Service: [name] [facility], DO [name] 12/23/2022 9:30 A M Medical Record Number: [id number] Patient Account Number: [id number] Date of Birth/Sex: Treating RN: [birth year] (57 y.o. [name] Primary Care [name] Chase Clinician: Referring [name]: Treating [name]/Extender: Gabriel [name] in Treatment: 48 Visit Information History Since Last Visit Added or deleted any medications: No Patient Arrived: Wheel Chair Any new allergies or adverse reactions: No Arrival Time: 09:46 Had a fall or experienced change in Yes Accompanied By: self activities of daily living that may affect Transfer Assistance: Transfer Board risk of falls: Patient Identification Verified: Yes Hospitalized since last visit: No Secondary Verification Process Completed: Yes Has Dressing in Place as Prescribed: Yes Patient Requires Transmission-Based Precautions: No Pain Present Now: No Patient Has Alerts: Yes Patient Alerts: NOT diabetic Notes pt states wheelchair slipped out from under him during transfer, no injuries or hit head during fall Electronic Signature(s) Signed: 12/23/2022 2:55:29 PM By: Chase Aver MSN RN CNS WTA Entered By: [name] on 12/23/2022 11:55:28 -------------------------------------------------------------------------------- Clinic Level of Care Assessment Details Patient Name: Date of ServiceEulas [name] [facility], DO NA [name] 12/23/2022 9:30 A M Medical Record Number: [id number] Patient Account Number: [id number] Date of Birth/Sex: Treating RN: Mar 15, 1966 (57 y.o. [name] Primary Care Lequisha Cammack: Aram [name] Chase Clinician: Referring Aneisha Skyles: Treating Aribella Vavra/Extender: Gabriel [name] in Treatment: 48 Clinic Level of Care Assessment Items TOOL 4 Quantity Score X-  1 0 Use when only an EandM is performed on FOLLOW-UP visit ASSESSMENTS - Nursing Assessment / Reassessment X- 1 10 Reassessment of Co-morbidities (includes updates in patient status) X- 1 5 Reassessment of Adherence to Treatment Plan [facility], [name] ([id number]) [id number] Page 2 of 8 ASSESSMENTS - Wound and Skin A ssessment / Reassessment X - Simple Wound Assessment / Reassessment - one wound 1 5 []  - 0 Complex Wound Assessment / Reassessment - multiple wounds []  - 0 Dermatologic / Skin Assessment (not related to wound area) ASSESSMENTS - Focused Assessment []  - 0 Circumferential Edema Measurements - multi extremities []  - 0 Nutritional Assessment / Counseling / Intervention []  - 0 Lower Extremity Assessment (monofilament, tuning fork, pulses) []  - 0 Peripheral Arterial Disease Assessment (using hand held doppler) ASSESSMENTS - Ostomy and/or Continence Assessment and Care []  - 0 Incontinence Assessment and Management []  - 0 Ostomy Care Assessment and Management (repouching, etc.) PROCESS - Coordination of Care X - Simple Patient / Family Education for ongoing care 1 15 []  - 0 Complex (extensive) Patient / Family Education for ongoing care X- 1 10 Staff obtains Chiropractor, Records, T Results / Process Orders est []  - 0 Staff telephones HHA, Nursing Homes / Clarify orders / etc []  - 0 Routine Transfer to another Facility (non-emergent condition) []  - 0 Routine Hospital Admission (non-emergent condition) []  - 0 New Admissions / Manufacturing engineer / Ordering NPWT Apligraf, etc. , []  - 0 Emergency Hospital Admission (emergent condition) X- 1 10 Simple Discharge Coordination []  - 0 Complex (extensive) Discharge Coordination PROCESS - Special Needs []  - 0 Pediatric / Minor Patient Management []  - 0 Isolation Patient Management []  - 0 Hearing / Language / Visual special needs []  - 0 Assessment of Community assistance (transportation,  D/C planning, etc.) []  - 0 Additional assistance / Altered mentation []  - 0 Support Surface(s) Assessment (bed, cushion, seat, etc.) INTERVENTIONS - Wound Cleansing / Measurement X - Simple  Wound Cleansing - one wound 1 5 []  - 0 Complex Wound Cleansing - multiple wounds X- 1 5 Wound Imaging (photographs - any number of wounds) []  - 0 Wound Tracing (instead of photographs) X- 1 5 Simple Wound Measurement - one wound []  - 0 Complex Wound Measurement - multiple wounds INTERVENTIONS - Wound Dressings []  - 0 Small Wound Dressing one or multiple wounds X- 1 15 Medium Wound Dressing one or multiple wounds []  - 0 Large Wound Dressing one or multiple wounds []  - 0 Application of Medications - topical []  - 0 Application of Medications - injection INTERVENTIONS - Miscellaneous []  - 0 External ear exam [name] ([id number]) [id number] Page 3 of 8 []  - 0 Specimen Collection (cultures, biopsies, blood, body fluids, etc.) []  - 0 Specimen(s) / Culture(s) sent or taken to Lab for analysis []  - 0 Patient Transfer (multiple staff / Michiel Sites Lift / Similar devices) []  - 0 Simple Staple / Suture removal (25 or less) []  - 0 Complex Staple / Suture removal (26 or more) []  - 0 Hypo / Hyperglycemic Management (close monitor of Blood Glucose) []  - 0 Ankle / Brachial Index (ABI) - do not check if billed separately X- 1 5 Vital Signs Has the patient been seen at the hospital within the last three years: Yes Total Score: 90 Level Of Care: New/Established - Level 3 Electronic Signature(s) Signed: 12/23/2022 4:30:16 PM By: Chase Aver MSN RN CNS WTA Entered By: [name] on 12/23/2022 07:16:18 -------------------------------------------------------------------------------- Encounter Discharge Information Details Patient Name: Date of Service: [name] [facility], DO NA [name] 12/23/2022 9:30 A M Medical Record Number: [id number] Patient Account Number: [id number] Date  of Birth/Sex: Treating RN: 06-01-1966 (57 y.o. [name] Primary Care Taniqua Issa: Aram [name] Chase Clinician: Referring Nickalos Petersen: Treating Xaria Judon/Extender: Gabriel [name] in Treatment: 48 Encounter Discharge Information Items Discharge Condition: Stable Ambulatory Status: Wheelchair Discharge Destination: Home Transportation: Private Auto Accompanied By: self Schedule Follow-up Appointment: Yes Clinical Summary of Care: Electronic Signature(s) Signed: 12/23/2022 2:55:58 PM By: Chase Aver MSN RN CNS WTA Entered By: [name] on 12/23/2022 11:55:58 -------------------------------------------------------------------------------- Lower Extremity Assessment Details Patient Name: Date of Service: [facility], DO NA [name] 12/23/2022 9:30 A MADEX BATTERSON, [name] ([id number]) [id number] Page 4 of 8 Medical Record Number: [id number] Patient Account Number: [id number] Date of Birth/Sex: Treating RN: December 31, 1965 (57 y.o. [name] Primary Care Debraann Livingstone: Aram [name] Chase Clinician: Referring Evelen Vazguez: Treating Bera Pinela/Extender: Hermine Messick Weeks in Treatment: 48 Electronic Signature(s) Signed: 12/23/2022 3:58:45 PM By: Angelina Pih Signed: 12/23/2022 4:30:16 PM By: Chase Aver MSN RN CNS WTA Entered By: [name] on 12/23/2022 07:13:49 -------------------------------------------------------------------------------- Multi Wound Chart Details Patient Name: Date of Service: [name] TH, DO NA [name] 12/23/2022 9:30 A M Medical Record Number: [id number] Patient Account Number: [id number] Date of Birth/Sex: Treating RN: 07/15/1966 (57 y.o. [name] Primary Care Leta Bucklin: Aram [name] Chase Clinician: Referring Johan Creveling: Treating Elliona Doddridge/Extender: Hermine Messick Weeks in Treatment: 48 Vital Signs Height(in): 60 Pulse(bpm): 88 Weight(lbs): Blood Pressure(mmHg): 93/61 Body Mass  Index(BMI): Temperature(F): 98 Respiratory Rate(breaths/min): 18 [3:Photos:] [N/A:N/A] Right Ischial Tuberosity N/A N/A Wound Location: Gradually Appeared N/A N/A Wounding Event: Pressure Ulcer N/A N/A Primary Etiology: Cataracts, Glaucoma, Coronary Artery N/A N/A Comorbid History: Disease, Hypertension, History of pressure wounds, Osteoarthritis, Paraplegia 09/03/2021 N/A N/A Date Acquired: 48 N/A N/A Weeks of Treatment: Open N/A N/A Wound Status: No N/A N/A Wound Recurrence: 3.7x5.2x1.3 N/A N/A Measurements L x W x D (cm) 15.111  N/A N/A A (cm) : rea 19.644 N/A N/A Volume (cm) : 14.10% N/A N/A % Reduction in A rea: 55.30% N/A N/A % Reduction in Volume: Category/Stage IV N/A N/A Classification: Medium N/A N/A Exudate A mount: Serosanguineous N/A N/A Exudate Type: red, brown N/A N/A Exudate Color: Epibole N/A N/A Wound Margin: Large (67-100%) N/A N/A Granulation A mount: Red, Pink N/A N/A Granulation Quality: Small (1-33%) N/A N/A Necrotic A mount: Eschar, Adherent Slough N/A N/A Necrotic Tissue: [name] ([id number]) [id number] Page 5 of 8 Fat Layer (Subcutaneous Tissue): Yes N/A N/A Exposed Structures: Muscle: Yes Bone: Yes None N/A N/A Epithelialization: Treatment Notes Electronic Signature(s) Signed: 12/23/2022 4:30:16 PM By: Chase Aver MSN RN CNS WTA Entered By: [name] on 12/23/2022 07:14:11 -------------------------------------------------------------------------------- Multi-Disciplinary Care Plan Details Patient Name: Date of Service: [name] TH, DO NA [name] 12/23/2022 9:30 A M Medical Record Number: [id number] Patient Account Number: [id number] Date of Birth/Sex: Treating RN: [birth year] (57 y.o. [name] Primary Care Isis Costanza: Aram [name] Chase Clinician: Referring Fintan Grater: Treating Edgard Debord/Extender: Hermine Messick Weeks in Treatment: 39 Active Inactive Electronic  Signature(s) Signed: 02/03/2023 12:32:13 PM By: Elliot Gurney, BSN, RN, CWS, Kim RN, BSN Signed: 06/03/2023 2:47:59 PM By: Chase Aver MSN RN CNS WTA Previous Signature: 12/23/2022 4:30:16 PM Version By: Chase Aver MSN RN CNS WTA Entered By: Elliot Gurney, BSN, RN, CWS, Kim on 02/03/2023 09:32:13 -------------------------------------------------------------------------------- Pain Assessment Details Patient Name: Date of Service: [name] [facility], DO NA [name] 12/23/2022 9:30 A M Medical Record Number: [id number] Patient Account Number: [id number] Date of Birth/Sex: Treating RN: 1966-05-13 (57 y.o. [name] Primary Care Emelda Kohlbeck: Aram [name] Chase Clinician: Referring Jannett Schmall: Treating Keefe Zawistowski/Extender: Gabriel [name] in Treatment: 48 Active Problems Location of Pain Severity and Description of Pain Patient Has Paino No Site Locations Rate the pain. [name], [name] ([id number]) [id number] Page 6 of 8 Rate the pain. Current Pain Level: 0 Pain Management and Medication Current Pain Management: Electronic Signature(s) Signed: 12/23/2022 3:58:45 PM By: Angelina Pih Signed: 12/23/2022 4:30:16 PM By: Chase Aver MSN RN CNS WTA Entered By: [name] on 12/23/2022 07:13:40 -------------------------------------------------------------------------------- Patient/Caregiver Education Details Patient Name: Date of Service: [name] TH, DO NA [name] 3/21/2024andnbsp9:30 A M Medical Record Number: [id number] Patient Account Number: [id number] Date of Birth/Gender: Treating RN: November 09, 1965 (57 y.o. [name] Primary Care Physician: Aram [name] Chase Clinician: Referring Physician: Treating Physician/Extender: Gabriel [name] in Treatment: 90 Education Assessment Education Provided To: Patient Education Topics Provided Wound/Skin Impairment: Handouts: Caring for Your Ulcer Methods: Explain/Verbal Responses: State content  correctly Electronic Signature(s) Signed: 12/23/2022 4:30:16 PM By: Chase Aver MSN RN CNS WTA Entered By: [name] on 12/23/2022 07:16:47 [name] ([id number]) [id number] Page 7 of 8 -------------------------------------------------------------------------------- Wound Assessment Details Patient Name: Date of ServiceEulas [name] [facility], DO NA [name] 12/23/2022 9:30 A M Medical Record Number: [id number] Patient Account Number: [id number] Date of Birth/Sex: Treating RN: [birth year] (57 y.o. [name] Primary Care [place] Espina: Aram [name] Chase Clinician: Referring Estus Krakowski: Treating Lorenz Donley/Extender: Hermine Messick Weeks in Treatment: 48 Wound Status Wound Number: 3 Primary Pressure Ulcer Etiology: Wound Location: Right Ischial Tuberosity Wound Open Wounding Event: Gradually Appeared Status: Date Acquired: 09/03/2021 Comorbid Cataracts, Glaucoma, Coronary Artery Disease, Hypertension, Weeks Of Treatment: 48 History: History of pressure wounds, Osteoarthritis, Paraplegia Clustered Wound: No Photos Wound Measurements Length: (cm) 3.7 Width: (cm) 5.2 Depth: (cm) 1.3 Area: (cm) 15.111 Volume: (cm) 19.644 % Reduction in Area: 14.1% % Reduction in Volume: 55.3% Epithelialization:  None Tunneling: No Undermining: No Wound Description Classification: Category/Stage IV Wound Margin: Epibole Exudate Amount: Medium Exudate Type: Serosanguineous Exudate Color: red, brown Foul Odor After Cleansing: No Slough/Fibrino Yes Wound Bed Granulation Amount: Large (67-100%) Exposed Structure Granulation Quality: Red, Pink Fat Layer (Subcutaneous Tissue) Exposed: Yes Necrotic Amount: Small (1-33%) Muscle Exposed: Yes Necrotic Quality: Eschar, Adherent Slough Necrosis of Muscle: No Bone Exposed: Yes Electronic Signature(s) Signed: 12/23/2022 9:56:16 AM By: Angelina Pih Entered By: Angelina Pih on 12/23/2022 06:56:16 [name] ([id number]) [id number] Page 8 of 8 -------------------------------------------------------------------------------- Vitals Details Patient Name: Date of ServiceEulas [name] [facility], DO NA [name] 12/23/2022 9:30 A M Medical Record Number: [id number] Patient Account Number: [id number] Date of Birth/Sex: Treating RN: 11-Nov-1965 (57 y.o. [name] Primary Care Lamario Mani: Aram [name] Chase Clinician: Referring Makhi Muzquiz: Treating Autry Droege/Extender: Hermine Messick Weeks in Treatment: 48 Vital Signs Time Taken: 09:45 Temperature (F): 98 Height (in): 60 Pulse (bpm): 88 Respiratory Rate (breaths/min): 18 Blood Pressure (mmHg): 93/61 Reference Range: 80 - 120 mg / dl Electronic Signature(s) Signed: 12/23/2022 2:55:32 PM By: Chase Aver MSN RN CNS WTA Entered By: [name] on 12/23/2022 11:55:32

## 2022-12-24 NOTE — Progress Notes (Addendum)
[name], [name] ([id number]) [id number] Page 1 of 9 Visit Report for 12/23/2022 Chief Complaint Document Details Patient Name: Date of Service: [name] [facility], [name] 12/23/2022 9:30 A M Medical Record Number: [id number] Patient Account Number: [id number] Date of Birth/Sex: Treating RN: 03-28-1966 (57 y.o. [name] Primary Care Provider: Fulton Reek Other Clinician: Referring Provider: Treating Provider/Extender: [name] Weeks in Treatment: 48 Information Obtained from: Patient Chief Complaint Right ischial tuberosity pressure ulcer Electronic Signature(s) Signed: 12/23/2022 9:45:21 AM By: Worthy Keeler PA-C Entered By: Worthy Keeler on 12/23/2022 09:45:20 -------------------------------------------------------------------------------- HPI Details Patient Name: Date of Service: Chase TH, [name] 12/23/2022 9:30 A M Medical Record Number: [id number] Patient Account Number: [id number] Date of Birth/Sex: Treating RN: Nov 05, 1965 (56 y.o. [name] Primary Care Provider: Fulton Reek Other Clinician: Referring Provider: Treating Provider/Extender: Rupert Stacks in Treatment: 34 History of Present Illness HPI Description: 57 year old male with a history of spina bifida presenting to [place] with a history of a open wound on the left gluteal region near his upper thigh which she's had for several weeks. He was seen in the ED recently at [place] [facility] and was put on doxycycline and was advised some local care. his past medical history is significant for spinal bifid, neurogenic bladder, kidney stones, sacral pressure sores, constipation. Past surgical history significant for partial cystectomy, ileal conduit, VP shunt removal, percutaneous nephro lithotripsy. In the remote past the patient says he's had some treatment for a ulcer on this area and was treated with a skin  graft. Readmission: 10/16/2021 upon evaluation today patient appears to be doing somewhat poorly in regard to a fairly large wound noted over the right ischial tuberosity location. This is a stage IV pressure ulcer. Of note this is also positive for osteomyelitis upon a CT scan which was performed during the time that he was admitted in the hospital on 09/19/2021. With that being said it is noted in the right inferior buttock that he has a decubitus ulcer which extends to the posterior toe inferior right ischium where there is evidence of osteomyelitis. When he was here in the office actually missed that this was positive I missed read it and thought it said that there was no osteomyelitis. That is not the case there is in fact osteomyelitis noted here. I actually did review his notes as well in epic. Looking to the discharge summary it appears that the patient was admitted from 09/19/2021 through 09/23/2021. He was discharged home with home health care according to the note. With that being said from what I understand his sister actually takes care of him at this point. He was also to follow-up with a primary care provider and here at the wound care center within a week. I am just now seeing him on the 13th almost a [place], [place] ([id number]) [id number] Page 2 of 9 month after discharge. He does have a history of again osteomyelitis of this right hip/ischial tuberosity location. 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 The reason for his admission was actually worsening of the decubitus ulcer upon admission. He does have chronic osteomyelitis of this area. He was treated with empiric antibiotics he had acute kidney injury which improved with IV fluids he also had hypokalemia. Surgical debridement was performed by general  surgery at that time and ID was consulted to assist with management according to the notes. IV antibiotics were recommended but patient declined and wanted oral antibiotics at discharge. Therefore no IV antibiotics were planned for discharge time. This case was under the care of Dr. Steva Ready while he was in the hospital when she did discharge him with a 2-week course of doxycycline and Augmentin according to the notes. It looks like Dr. Doy Hutching office did call and leave a message for the patient to get scheduled for a follow-up visit after hospital discharge I do not see where he showed up in fact it appears that the patient has a no-show letter from Dr. Doy Hutching office noted in epic due to a appointment that was missed on 10/06/2021. Looking at the pictures from Dr. Raelene Bott notes on 09/23/2021 it does appear that the wound is a little bit better as far as the cleanliness of the surface of the wound today but nonetheless still is a very significant wound. 10/26/2021 upon evaluation today patient appears to be doing okay in regard to his wound. Again this is a wound that he did indeed have osteomyelitis and previous. With that being said I do not see any signs of a worsening infection at this time which is good news. Overall I think that we are headed in the right direction although still I think he needs more aggressive offloading. 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 I do believe this would be ideal for him to be honest. Readmission: 01-15-2022 upon evaluation today patient presents for reevaluation he was last seen on October 26, 2021. At that time unfortunately he was having issues with a significant ischial pressure ulcer on the right ischial tuberosity. Subsequently he ended up having decent response to the Dakin's moistened gauze dressing that was previously being utilized and subsequently the patient's sister who is his primary caregiver as well states that she  decided just to try to manage this at home. Nonetheless unfortunately this is getting a little larger not better. He does spend a lot of the day in his chair. He does have an air mattress according what they tell me today. That was previously ordered. With that being said I do believe that at this time things do appear to be doing much better from the standpoint of infection I do not see any signs of overt infection. There is also no evidence of systemic infection. No fevers, chills, nausea, vomiting, or diarrhea. With that being said I do believe based on what I am seeing the wound is a bit cleaner and he possibly could be an excellent candidate for a wound VAC. 02-05-2022 upon evaluation today patient appears to be doing about the same in regard to his wound. Unfortunately he is not any better but he also really has not had the wound VAC on sufficiently. There is been some confusion with the orders part of that I think is our follow-up with the way the order was written part of it may be with how the wound VAC is being placed either way we need to see what we can do to try to improve things in this regard. We will clarify the orders today going forward for home health. Unfortunately the patient does have a new wound today which is on the opposite side. This is not good 1 was bad enough have another area to open is definitely not a good thing. Its not as deep but nonetheless  is also not very shallow either. We may potentially be able to get this to the point that we could bridge the 2 together in order to wound VAC both but right now want a focus on to try to get the wound VAC going for the main wound initially we will likely use Hydrofera Blue on the new wound.Marland Kitchen 02-19-2022 upon evaluation today patient's wound appears to be doing a little bit worse in the way of maceration fortunately there is no signs of active infection locally or systemically which is great news. No fevers, chills, nausea, vomiting,  or diarrhea. 03-05-2022 upon evaluation today patient unfortunately does not appear to be doing nearly as good in regard to his wounds as I would like to see. Fortunately I do not see any evidence of active infection that is obvious although I am can obtain a wound culture to ensure that there is not anything getting worse here as far as the wound without the wound VAC on his concern. With that being said I will go ahead and get this sent in and evaluated will initiate treatment as needed going forward if necessary. With regard to the wound with the wound VAC this continues to be placed directly over the wound and there is obvious signs of deep tissue injury. The suction being here means that he is sitting on it I think this may be part of the reason why it is coming off although not 903% certain. 03-12-2022 upon evaluation today patient still has not gotten the antibiotics that were called in for him 4 days ago. He tells me that his sister just told him this morning that they were ready but that she has not had a chance to go pick them up. Nonetheless I feel like that this is really something he needs any should have been taken that not the other antibiotics which were not doing the job for him to be honest. The patient states and I completely agree that there is really no way he would have known relying on his sister to let him know obviously. Nonetheless I do believe that he needs to not be taking any other antibiotics and be on the Levaquin as soon as possible. He tells me that she said she was going to pick them up today. In regard to home health I am hopeful that they actually have been bridging this now. I do think that the wound looks better and this is good news. With that being said I am not certain that I can really tell for sure how this was attached it was removed before he came in and the black foam left in place. I really want him to leave the wound VAC in place until he comes in so that we  can monitor and make sure that this is being done correctly he voiced understanding. 03-19-2022 upon evaluation today and much happier with how things appear as far as the patient's wounds are concerned. I think he is on a much better track and they are doing a better job at bridging although its not quite where I like to see it is still more posterior which I think the patient needs to be a little bit more lateral on his side. Either way this is something that I did marked with a small dot today for him to tell the home health nurses well so she can bridge it to that location also think the tubing should go up instead of going down to just  make it less cumbersome overall for him. 04-19-2022 upon evaluation today patient appears to be doing a little better in regard to his wounds. Fortunately I do not see any signs of infection I do believe he is on the right track. The wound VAC does seem to be helping to clean the wound up a bit and bring it in from the base at work. This is good news. 05-10-2022 upon evaluation today patient appears to be doing well currently in regard to his wounds in general in fact of the smaller of the 2 wounds without the wound VAC actually appears to be doing so much better this is almost completely closed and very pleased in that regard. He has been staying off of this he tells me and shows. With that being said the wound with the wound VAC is going require some sharp debridement there is some tissue which is not quite as healthy and we are going to go ahead and continue that for probably 2 more weeks although I think we may be closer to discontinuing this as well since it has healed and quite significantly. 05-24-2022 upon evaluation today patient appears to be doing well with regard to his wound in fact this looks better than I have been expected. This actually I think is a good time to go ahead and switch away from the wound VAC based on what I am seeing. I think we go to  [facility] with a bordered foam dressing. 06-21-2022 upon evaluation today patient appears to be doing well currently in regard to his wound this is measuring smaller and looks well. Fortunately there does not appear to be any signs of active infection locally or systemically at this time which is great news. No fevers, chills, nausea, vomiting, or diarrhea. 07-29-2022 upon evaluation today patient appears to be doing well currently in regard to his wound. This is actually measuring smaller although there is a lot of hypergranulation noted. Fortunately there does not appear to be any signs of active infection locally or systemically at this time. No fevers, chills, nausea, vomiting, or diarrhea. 11/9; pressure ulcer on the right buttock in the setting of spina bifida and lower extremity paralysis. He has been using [facility] 09-02-2022 upon evaluation today patient appears to be doing well currently in regard to his wound. We have been using Hydrofera Blue this is showing signs of improvement as far as getting smaller is concerned. There is some drainage around the edges of the wound but I think a lot of this is due to the fact he did not even have a dressing on that he came in today. 12/29; right buttock pressure area which was initially stage IV. This is in the setting of a patient with spina bifida spends most of his time in a wheelchair. I am not certain how much he is offloading this. He has been using for [facility] as a primary dressing. When he was here the last visit he was felt to have hyper granulated tissue and was treated with silver nitrate chemical cauterization 10-14-2022 upon evaluation today patient appears to be doing poorly in regard to the wound in the right ischial location. Unfortunately he tells me that the [place], [place] ([id number]) [id number] Page 3 of 9 dressing seems to be falling off frequently. Unfortunately I think that this  has contributed to him developing an infection which appears to be exemplified by the fact that he has bone exposed which is actually necrotic and working its  way out of the wound up and have to perform a fairly significant debridement today it does appear. I think that he needs to have these dressings ensure that they are in place at all times. Also discussed with the patient that we will get a need to do some testing in order to see if he indeed has an infection. If he does have a bone infection which I think is pretty likely to be osteomyelitis of this ischial location. I discussed that with the patient in detail today and depending on what we see him and make any adjustments in care as far as going forward is concerned. Obviously depressed antibiotic that we gave him that he tells me was somewhat irritating as far as the stomach side effects are concerned. He states he really would not want to be on that again. Either way I feel he is probably going to be placed on something once we get the results of the culture back. I am honestly concerned about the fact that he comes into the clinic without any dressings in place I am not sure how often they are really staying where they need to be as far as this wound is concerned. I actually cannot remember seeing him anytime in the past 2 months or so at least that he came in with a dressing intact. 10-21-2022 upon evaluation today patient presents for initial inspection here in our clinic concerning issues that he has been having with a wound in the sacral area. Again last time he was here I did debride away necrotic bone which was confirmed on pathology although it was not necessarily confirmed that this was secondary to osteomyelitis on the pathology report. Also the x-ray did not neither confirm nor deny the presence of osteomyelitis and his culture unfortunately showed multiple organisms with nothing predominance of this does not help to rule and tailor  down exactly what is going on here. Fortunately I do not see any signs of active infection systemically which is great news. With that being said locally I am definitely seeing some issues here. In fact this is warm to touch around the sacral area and I think that cellulitis is definitely present I just cannot pinpoint the exact organism. With that being said I did discuss with the patient as well today that I do believe that he probably needs an MRI in order to further quantify the extent of her likely osteomyelitis and this again is something that we are going to order this will need to be without contrast due to the fact that he is allergic to contrast dye. 11-18-2022 upon evaluation today patient's wound is actually showing some signs of improvement currently. We have been doing the Dakin's moistened gauze dressing which I think is helping to clean up the wound for seeing some granulation growing and still the tissue is not quite as healthy as I would love to see but better than previous. He does have osteomyelitis but the good news is he seems to be treated well with the antibiotics he does have a refill that was just picked up and I think that he needs to continue to take that for certain. 3/7; patient is on a 6-week clinic hiatus. He lives at home with his sister however she is not at home all the time. He transfers out of bed and a sliding board. He claims he is only in the in the wheelchair about 4 5 hours. He does have underlying osteomyelitis I am not  sure about the status of the antibiotics here. Part of the wound still easily probes to bone 12-23-2022 upon evaluation today patient appears to be doing well currently in regard to his wound all things considered there is still a lot of hypergranulation but I do feel like that with the Vashe moistened gauze is actually doing better than he was previously with the Hydrofera Blue. This just did not seem to be staying in place and with the Vashe  through able to change this more frequently. Electronic Signature(s) Signed: 12/23/2022 4:45:40 PM By: Worthy Keeler PA-C Entered By: Worthy Keeler on 12/23/2022 16:45:40 -------------------------------------------------------------------------------- Physical Exam Details Patient Name: Date of Service: [name] [facility], [name] 12/23/2022 9:30 A M Medical Record Number: [id number] Patient Account Number: [id number] Date of Birth/Sex: Treating RN: 10/08/1965 (57 y.o. [name] Primary Care Provider: Fulton Reek Other Clinician: Referring Provider: Treating Provider/Extender: [name] Weeks in Treatment: 64 Constitutional Well-nourished and well-hydrated in no acute distress. Respiratory normal breathing without difficulty. Psychiatric this patient is able to make decisions and demonstrates good insight into disease process. Alert and Oriented x 3. pleasant and cooperative. Notes Upon inspection patient's wound bed actually did not require sharp debridement at this point although I definitely see signs of hypergranulation I still think that this is probably the best way to go for now and again they are able to change this more frequently through the day since he does not seem to have a good time to try to keep dressings in place. Electronic Signature(s) Signed: 12/23/2022 4:46:02 PM By: Worthy Keeler PA-C Entered By: Worthy Keeler on 12/23/2022 16:46:01 [name] ([id number]) [id number] Page 4 of 9 -------------------------------------------------------------------------------- Physician Orders Details Patient Name: Date of ServiceMila [name] [facility], [name] 12/23/2022 9:30 A M Medical Record Number: [id number] Patient Account Number: [id number] Date of Birth/Sex: Treating RN: January 27, 1966 (57 y.o. [name] Primary Care Provider: Fulton Reek Other Clinician: Referring Provider: Treating Provider/Extender: Rupert Stacks in Treatment: 93 Verbal / Phone Orders: No Diagnosis Coding ICD-10 Coding Code Description L89.314 Pressure ulcer of right buttock, stage 4 Q76.0 Spina bifida occulta Z93.3 Colostomy status I10 Essential (primary) hypertension I25.10 Atherosclerotic heart disease of native coronary artery without angina pectoris Follow-up Appointments Return Appointment in 1 week. Bathing/ Shower/ Hygiene Clean wound with Normal Saline or wound cleanser. - keep dressing dry or change after shower No tub bath. Anesthetic (Use 'Patient Medications' Section for Anesthetic Order Entry) Lidocaine applied to wound bed Off-Loading Gel wheelchair cushion Hospital bed/mattress - Has at home A fluidized (Group 3) - Has from [employer] ir Turn and reposition every 2 hours - Do not sit in wheelchair any longer than you have to--offload wound in bed as much as possible Other: - avoid sliding which causes friction as much as you can Additional Orders / Instructions Follow Nutritious Diet and Increase Protein Intake Wound Treatment Wound #3 - Ischial Tuberosity Wound Laterality: Right Cleanser: Byram Ancillary Kit - 15 Day Supply (Generic) 3 x Per Week/30 Days Discharge Instructions: Use supplies as instructed; Kit contains: (15) Saline Bullets; (15) 3x3 Gauze; 15 pr Gloves Cleanser: Dakin 16 (oz) 0.25 3 x Per Week/30 Days Discharge Instructions: Use as directed. Cleanser: Normal Saline 3 x Per Week/30 Days Discharge Instructions: Wash your hands with soap and water. Remove old dressing, discard into plastic bag and place into trash. Cleanse the wound with Normal Saline prior to applying a clean dressing using gauze  sponges, not tissues or cotton balls. Do not scrub or use excessive force. Pat dry using gauze sponges, not tissue or cotton balls. Prim Dressing: Gauze (DME) (Generic) 3 x Per Week/30 Days ary Discharge Instructions: moistened with dakins Secondary Dressing: ABD Pad 5x9  (in/in) (DME) (Generic) 3 x Per Week/30 Days Discharge Instructions: Cover with ABD pad Electronic Signature(s) Signed: 12/23/2022 4:30:16 PM By: [name] MSN RN CNS WTA Signed: 12/23/2022 5:06:45 PM By: Irean Hong [place], [place] ([id number]) By: Worthy Keeler PA-C [id number] Page 5 of 9 Signed: 12/23/2022 5:06:45 PM Entered By: [name] on 12/23/2022 14:55:23 -------------------------------------------------------------------------------- Problem List Details Patient Name: Date of Service: [name] [facility], [name] 12/23/2022 9:30 A M Medical Record Number: [id number] Patient Account Number: [id number] Date of Birth/Sex: Treating RN: 02/24/1966 (57 y.o. [name] Primary Care Provider: Fulton Reek Other Clinician: Referring Provider: Treating Provider/Extender: [name] Weeks in Treatment: 71 Active Problems ICD-10 Encounter Code Description Active Date MDM Diagnosis L89.314 Pressure ulcer of right buttock, stage 4 01/15/2022 No Yes Q76.0 Spina bifida occulta 01/15/2022 No Yes Z93.3 Colostomy status 01/15/2022 No Yes I10 Essential (primary) hypertension 01/15/2022 No Yes I25.10 Atherosclerotic heart disease of native coronary artery without angina pectoris 01/15/2022 No Yes Inactive Problems ICD-10 Code Description Active Date Inactive Date L89.323 Pressure ulcer of left buttock, stage 3 02/05/2022 02/05/2022 Resolved Problems Electronic Signature(s) Signed: 12/23/2022 4:30:16 PM By: [name] MSN RN CNS WTA Signed: 12/23/2022 5:06:45 PM By: Worthy Keeler PA-C Previous Signature: 12/23/2022 9:45:17 AM Version By: Worthy Keeler PA-C Entered By: [name] on 12/23/2022 10:16:58 [name] ([id number]) [id number] Page 6 of 9 -------------------------------------------------------------------------------- Progress Note Details Patient Name: Date of ServiceMila [name] [facility], DO  NA LD 12/23/2022 9:30 A M Medical Record Number: [id number] Patient Account Number: [id number] Date of Birth/Sex: Treating RN: Jan 04, 1966 (57 y.o. [name] Primary Care Provider: Fulton Reek Other Clinician: Referring Provider: Treating Provider/Extender: Rupert Stacks in Treatment: 60 Subjective Chief Complaint Information obtained from Patient Right ischial tuberosity pressure ulcer History of Present Illness (HPI) 57 year old male with a history of spina bifida presenting to [place] with a history of a open wound on the left gluteal region near his upper thigh which she's had for several weeks. He was seen in the ED recently at [place] [facility] and was put on doxycycline and was advised some local care. his past medical history is significant for spinal bifid, neurogenic bladder, kidney stones, sacral pressure sores, constipation. Past surgical history significant for partial cystectomy, ileal conduit, VP shunt removal, percutaneous nephro lithotripsy. In the remote past the patient says he's had some treatment for a ulcer on this area and was treated with a skin graft. Readmission: 10/16/2021 upon evaluation today patient appears to be doing somewhat poorly in regard to a fairly large wound noted over the right ischial tuberosity location. This is a stage IV pressure ulcer. Of note this is also positive for osteomyelitis upon a CT scan which was performed during the time that he was admitted in the hospital on 09/19/2021. With that being said it is noted in the right inferior buttock that he has a decubitus ulcer which extends to the posterior toe inferior right ischium where there is evidence of osteomyelitis. When he was here in the office actually missed that this was positive I missed read it and thought it said that there was no osteomyelitis. That is not the case there is in fact  osteomyelitis noted here. I actually did review his notes as well in epic.  Looking to the discharge summary it appears that the patient was admitted from 09/19/2021 through 09/23/2021. He was discharged home with home health care according to the note. With that being said from what I understand his sister actually takes care of him at this point. He was also to follow-up with a primary care provider and here at the wound care center within a week. I am just now seeing him on the 13th almost a month after discharge. He does have a history of again osteomyelitis of this right hip/ischial tuberosity location. 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 The reason for his admission was actually worsening of the decubitus ulcer upon admission. He does have chronic osteomyelitis of this area. He was treated with empiric antibiotics he had acute kidney injury which improved with IV fluids he also had hypokalemia. Surgical debridement was performed by general surgery at that time and ID was consulted to assist with management according to the notes. IV antibiotics were recommended but patient declined and wanted oral antibiotics at discharge. Therefore no IV antibiotics were planned for discharge time. This case was under the care of Dr. Steva Ready while he was in the hospital when she did discharge him with a 2-week course of doxycycline and Augmentin according to the notes. It looks like Dr. Doy Hutching office did call and leave a message for the patient to get scheduled for a follow-up visit after hospital discharge I do not see where he showed up in fact it appears that the patient has a no-show letter from Dr. Doy Hutching office noted in epic due to a appointment that was missed on 10/06/2021. Looking at the pictures from Dr. Raelene Bott notes on 09/23/2021 it does appear that the wound is a little bit better as far as the cleanliness of the  surface of the wound today but nonetheless still is a very significant wound. 10/26/2021 upon evaluation today patient appears to be doing okay in regard to his wound. Again this is a wound that he did indeed have osteomyelitis and previous. With that being said I do not see any signs of a worsening infection at this time which is good news. Overall I think that we are headed in the right direction although still I think he needs more aggressive offloading. 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 I do believe this would be ideal for him to be honest. Readmission: 01-15-2022 upon evaluation today patient presents for reevaluation he was last seen on October 26, 2021. At that time unfortunately he was having issues with a significant ischial pressure ulcer on the right ischial tuberosity. Subsequently he ended up having decent response to the Dakin's moistened gauze dressing that was previously being utilized and subsequently the patient's sister who is his primary caregiver as well states that she decided just to try to manage this at home. Nonetheless unfortunately this is getting a little larger not better. He does spend a lot of the day in his chair. He does have an air mattress according what they tell me today. That was previously ordered. With that being said I do believe that at this time things do appear to be doing much better from the standpoint of infection I do not  see any signs of overt infection. There is also no evidence of systemic infection. No fevers, chills, nausea, vomiting, or diarrhea. With that being said I do believe based on what I am seeing the wound is a bit cleaner and he possibly could be an excellent candidate for a wound VAC. 02-05-2022 upon evaluation today patient appears to be doing about the same in regard to his wound. Unfortunately he is not any better but he also really has not had the wound VAC on sufficiently. There is been some confusion  with the orders part of that I think is our follow-up with the way the order was written part of it may be with how the wound VAC is being placed either way we need to see what we can do to try to improve things in this regard. We will clarify the orders today going forward for home health. Unfortunately the patient does have a new wound today which is on the opposite side. This is not good 1 was bad enough have another area to open is definitely not a good thing. Its not as deep but nonetheless is also not very shallow either. We may potentially be able to get this to the point that we could bridge the 2 together in order to wound VAC both but right now want a focus on to try to get the wound VAC going for the main wound initially we will likely use Hydrofera Blue on the new wound.Marland Kitchen 02-19-2022 upon evaluation today patient's wound appears to be doing a little bit worse in the way of maceration fortunately there is no signs of active infection locally or systemically which is great news. No fevers, chills, nausea, vomiting, or diarrhea. [place], [name] ([id number]) [id number] Page 7 of 9 03-05-2022 upon evaluation today patient unfortunately does not appear to be doing nearly as good in regard to his wounds as I would like to see. Fortunately I do not see any evidence of active infection that is obvious although I am can obtain a wound culture to ensure that there is not anything getting worse here as far as the wound without the wound VAC on his concern. With that being said I will go ahead and get this sent in and evaluated will initiate treatment as needed going forward if necessary. With regard to the wound with the wound VAC this continues to be placed directly over the wound and there is obvious signs of deep tissue injury. The suction being here means that he is sitting on it I think this may be part of the reason why it is coming off although not [id number]  certain. 03-12-2022 upon evaluation today patient still has not gotten the antibiotics that were called in for him 4 days ago. He tells me that his sister just told him this morning that they were ready but that she has not had a chance to go pick them up. Nonetheless I feel like that this is really something he needs any should have been taken that not the other antibiotics which were not doing the job for him to be honest. The patient states and I completely agree that there is really no way he would have known relying on his sister to let him know obviously. Nonetheless I do believe that he needs to not be taking any other antibiotics and be on the Levaquin as soon as possible. He tells me that she said she was going to pick them up today. In regard to home health  I am hopeful that they actually have been bridging this now. I do think that the wound looks better and this is good news. With that being said I am not certain that I can really tell for sure how this was attached it was removed before he came in and the black foam left in place. I really want him to leave the wound VAC in place until he comes in so that we can monitor and make sure that this is being done correctly he voiced understanding. 03-19-2022 upon evaluation today and much happier with how things appear as far as the patient's wounds are concerned. I think he is on a much better track and they are doing a better job at bridging although its not quite where I like to see it is still more posterior which I think the patient needs to be a little bit more lateral on his side. Either way this is something that I did marked with a small dot today for him to tell the home health nurses well so she can bridge it to that location also think the tubing should go up instead of going down to just make it less cumbersome overall for him. 04-19-2022 upon evaluation today patient appears to be doing a little better in regard to his wounds.  Fortunately I do not see any signs of infection I do believe he is on the right track. The wound VAC does seem to be helping to clean the wound up a bit and bring it in from the base at work. This is good news. 05-10-2022 upon evaluation today patient appears to be doing well currently in regard to his wounds in general in fact of the smaller of the 2 wounds without the wound VAC actually appears to be doing so much better this is almost completely closed and very pleased in that regard. He has been staying off of this he tells me and shows. With that being said the wound with the wound VAC is going require some sharp debridement there is some tissue which is not quite as healthy and we are going to go ahead and continue that for probably 2 more weeks although I think we may be closer to discontinuing this as well since it has healed and quite significantly. 05-24-2022 upon evaluation today patient appears to be doing well with regard to his wound in fact this looks better than I have been expected. This actually I think is a good time to go ahead and switch away from the wound VAC based on what I am seeing. I think we go to [facility] with a bordered foam dressing. 06-21-2022 upon evaluation today patient appears to be doing well currently in regard to his wound this is measuring smaller and looks well. Fortunately there does not appear to be any signs of active infection locally or systemically at this time which is great news. No fevers, chills, nausea, vomiting, or diarrhea. 07-29-2022 upon evaluation today patient appears to be doing well currently in regard to his wound. This is actually measuring smaller although there is a lot of hypergranulation noted. Fortunately there does not appear to be any signs of active infection locally or systemically at this time. No fevers, chills, nausea, vomiting, or diarrhea. 11/9; pressure ulcer on the right buttock in the setting of spina bifida and lower  extremity paralysis. He has been using [facility] 09-02-2022 upon evaluation today patient appears to be doing well currently in regard to his wound.  We have been using Hydrofera Blue this is showing signs of improvement as far as getting smaller is concerned. There is some drainage around the edges of the wound but I think a lot of this is due to the fact he did not even have a dressing on that he came in today. 12/29; right buttock pressure area which was initially stage IV. This is in the setting of a patient with spina bifida spends most of his time in a wheelchair. I am not certain how much he is offloading this. He has been using for [facility] as a primary dressing. When he was here the last visit he was felt to have hyper granulated tissue and was treated with silver nitrate chemical cauterization 10-14-2022 upon evaluation today patient appears to be doing poorly in regard to the wound in the right ischial location. Unfortunately he tells me that the dressing seems to be falling off frequently. Unfortunately I think that this has contributed to him developing an infection which appears to be exemplified by the fact that he has bone exposed which is actually necrotic and working its way out of the wound up and have to perform a fairly significant debridement today it does appear. I think that he needs to have these dressings ensure that they are in place at all times. Also discussed with the patient that we will get a need to do some testing in order to see if he indeed has an infection. If he does have a bone infection which I think is pretty likely to be osteomyelitis of this ischial location. I discussed that with the patient in detail today and depending on what we see him and make any adjustments in care as far as going forward is concerned. Obviously depressed antibiotic that we gave him that he tells me was somewhat irritating as far as the stomach side effects are concerned.  He states he really would not want to be on that again. Either way I feel he is probably going to be placed on something once we get the results of the culture back. I am honestly concerned about the fact that he comes into the clinic without any dressings in place I am not sure how often they are really staying where they need to be as far as this wound is concerned. I actually cannot remember seeing him anytime in the past 2 months or so at least that he came in with a dressing intact. 10-21-2022 upon evaluation today patient presents for initial inspection here in our clinic concerning issues that he has been having with a wound in the sacral area. Again last time he was here I did debride away necrotic bone which was confirmed on pathology although it was not necessarily confirmed that this was secondary to osteomyelitis on the pathology report. Also the x-ray did not neither confirm nor deny the presence of osteomyelitis and his culture unfortunately showed multiple organisms with nothing predominance of this does not help to rule and tailor down exactly what is going on here. Fortunately I do not see any signs of active infection systemically which is great news. With that being said locally I am definitely seeing some issues here. In fact this is warm to touch around the sacral area and I think that cellulitis is definitely present I just cannot pinpoint the exact organism. With that being said I did discuss with the patient as well today that I do believe that he probably needs an MRI in  order to further quantify the extent of her likely osteomyelitis and this again is something that we are going to order this will need to be without contrast due to the fact that he is allergic to contrast dye. 11-18-2022 upon evaluation today patient's wound is actually showing some signs of improvement currently. We have been doing the Dakin's moistened gauze dressing which I think is helping to clean up the  wound for seeing some granulation growing and still the tissue is not quite as healthy as I would love to see but better than previous. He does have osteomyelitis but the good news is he seems to be treated well with the antibiotics he does have a refill that was just picked up and I think that he needs to continue to take that for certain. 3/7; patient is on a 6-week clinic hiatus. He lives at home with his sister however she is not at home all the time. He transfers out of bed and a sliding board. He claims he is only in the in the wheelchair about 4 5 hours. He does have underlying osteomyelitis I am not sure about the status of the antibiotics here. Part of the wound still easily probes to bone 12-23-2022 upon evaluation today patient appears to be doing well currently in regard to his wound all things considered there is still a lot of hypergranulation but I do feel like that with the Vashe moistened gauze is actually doing better than he was previously with the Hydrofera Blue. This just did not seem to be staying in place and with the Vashe through able to change this more frequently. [place], [name] ([id number]) [id number] Page 8 of 9 Objective Constitutional Well-nourished and well-hydrated in no acute distress. Vitals Time Taken: 9:45 AM, Height: 60 in, Temperature: 98 F, Pulse: 88 bpm, Respiratory Rate: 18 breaths/min, Blood Pressure: 93/61 mmHg. Respiratory normal breathing without difficulty. Psychiatric this patient is able to make decisions and demonstrates good insight into disease process. Alert and Oriented x 3. pleasant and cooperative. General Notes: Upon inspection patient's wound bed actually did not require sharp debridement at this point although I definitely see signs of hypergranulation I still think that this is probably the best way to go for now and again they are able to change this more frequently through the day since he does not seem  to have a good time to try to keep dressings in place. Integumentary (Hair, Skin) Wound #3 status is Open. Original cause of wound was Gradually Appeared. The date acquired was: 09/03/2021. The wound has been in treatment 48 weeks. The wound is located on the Right Ischial Tuberosity. The wound measures 3.7cm length x 5.2cm width x 1.3cm depth; 15.111cm^2 area and 19.644cm^3 volume. There is bone, muscle, and Fat Layer (Subcutaneous Tissue) exposed. There is no tunneling or undermining noted. There is a medium amount of serosanguineous drainage noted. The wound margin is epibole. There is large (67-100%) red, pink granulation within the wound bed. There is a small (1-33%) amount of necrotic tissue within the wound bed including Eschar and Adherent Slough. Assessment Active Problems ICD-10 Pressure ulcer of right buttock, stage 4 Spina bifida occulta Colostomy status Essential (primary) hypertension Atherosclerotic heart disease of native coronary artery without angina pectoris Plan Follow-up Appointments: Return Appointment in 1 week. Bathing/ Shower/ Hygiene: Clean wound with Normal Saline or wound cleanser. - keep dressing dry or change after shower No tub bath. Anesthetic (Use 'Patient Medications' Section for Anesthetic Order Entry): Lidocaine applied to wound  bed Off-Loading: Gel wheelchair cushion Hospital bed/mattress - Has at home Air fluidized (Group 3) - Has from [employer] and reposition every 2 hours - Do not sit in wheelchair any longer than you have to--offload wound in bed as much as possible Other: - avoid sliding which causes friction as much as you can Additional Orders / Instructions: Follow Nutritious Diet and Increase Protein Intake WOUND #3: - Ischial Tuberosity Wound Laterality: Right Cleanser: Byram Ancillary Kit - 15 Day Supply (Generic) 3 x Per Week/30 Days Discharge Instructions: Use supplies as instructed; Kit contains: (15) Saline Bullets; (15) 3x3 Gauze;  15 pr Gloves Cleanser: Dakin 16 (oz) 0.25 3 x Per Week/30 Days Discharge Instructions: Use as directed. Cleanser: Normal Saline 3 x Per Week/30 Days Discharge Instructions: Wash your hands with soap and water. Remove old dressing, discard into plastic bag and place into trash. Cleanse the wound with Normal Saline prior to applying a clean dressing using gauze sponges, not tissues or cotton balls. Do not scrub or use excessive force. Pat dry using gauze sponges, not tissue or cotton balls. Prim Dressing: Gauze (DME) (Generic) 3 x Per Week/30 Days ary Discharge Instructions: moistened with dakins Secondary Dressing: ABD Pad 5x9 (in/in) (DME) (Generic) 3 x Per Week/30 Days Discharge Instructions: Cover with ABD pad 1. Would recommend currently that we have the patient continue to monitor for any signs of infection or worsening. Based on what I am seeing I believe that we are definitely headed in the right direction here but at the same time this is very slow hindered by the fact that he seems to be on the area a lot based on the fact that the dressing does not stay in place. 2. I am good recommend that he needs to have this dressing replaced anytime it comes off he voiced understanding. We will see patient back for reevaluation in 2 weeks here in the clinic. If anything worsens or changes patient will contact our office for additional recommendations. [place], [name] ([id number]) [id number] Page 9 of 9 Electronic Signature(s) Signed: 12/23/2022 4:46:39 PM By: Worthy Keeler PA-C Entered By: Worthy Keeler on 12/23/2022 16:46:39 -------------------------------------------------------------------------------- SuperBill Details Patient Name: Date of Service: [name] [facility] - Closed, [name] 12/23/2022 Medical Record Number: [id number] Patient Account Number: [id number] Date of Birth/Sex: Treating RN: 07-31-1966 (57 y.o. [name] Primary Care Provider: Fulton Reek  Other Clinician: Referring Provider: Treating Provider/Extender: Rupert Stacks in Treatment: 48 Diagnosis Coding ICD-10 Codes Code Description L89.314 Pressure ulcer of right buttock, stage 4 Q76.0 Spina bifida occulta Z93.3 Colostomy status I10 Essential (primary) hypertension I25.10 Atherosclerotic heart disease of native coronary artery without angina pectoris Facility Procedures : CPT4 Code: [id number] Description: 99213 - WOUND CARE VISIT-LEV 3 EST PT Modifier: Quantity: 1 Physician Procedures : CPT4 Code Description Modifier [id number] - WC PHYS LEVEL 3 - EST PT ICD-10 Diagnosis Description L89.314 Pressure ulcer of right buttock, stage 4 Q76.0 Spina bifida occulta Z93.3 Colostomy status I10 Essential (primary) hypertension Quantity: 1 Electronic Signature(s) Signed: 12/23/2022 4:46:59 PM By: Worthy Keeler PA-C Previous Signature: 12/23/2022 4:30:16 PM Version By: [name] MSN RN CNS WTA Entered By: Worthy Keeler on 12/23/2022 16:46:59

## 2023-01-06 ENCOUNTER — Ambulatory Visit: Payer: 59 | Admitting: Physician Assistant

## 2023-02-21 ENCOUNTER — Ambulatory Visit: Payer: 59 | Admitting: Physician Assistant

## 2023-03-31 ENCOUNTER — Encounter: Payer: 59 | Attending: Physician Assistant | Admitting: Physician Assistant

## 2023-03-31 DIAGNOSIS — Q760 Spina bifida occulta: Secondary | ICD-10-CM | POA: Diagnosis not present

## 2023-03-31 DIAGNOSIS — Z933 Colostomy status: Secondary | ICD-10-CM | POA: Insufficient documentation

## 2023-03-31 DIAGNOSIS — I129 Hypertensive chronic kidney disease with stage 1 through stage 4 chronic kidney disease, or unspecified chronic kidney disease: Secondary | ICD-10-CM | POA: Diagnosis not present

## 2023-03-31 DIAGNOSIS — G8220 Paraplegia, unspecified: Secondary | ICD-10-CM | POA: Insufficient documentation

## 2023-03-31 DIAGNOSIS — I2510 Atherosclerotic heart disease of native coronary artery without angina pectoris: Secondary | ICD-10-CM | POA: Diagnosis not present

## 2023-03-31 DIAGNOSIS — M1990 Unspecified osteoarthritis, unspecified site: Secondary | ICD-10-CM | POA: Diagnosis not present

## 2023-03-31 DIAGNOSIS — Z87442 Personal history of urinary calculi: Secondary | ICD-10-CM | POA: Diagnosis not present

## 2023-03-31 DIAGNOSIS — L89314 Pressure ulcer of right buttock, stage 4: Secondary | ICD-10-CM | POA: Insufficient documentation

## 2023-03-31 DIAGNOSIS — N183 Chronic kidney disease, stage 3 unspecified: Secondary | ICD-10-CM | POA: Diagnosis not present

## 2023-03-31 DIAGNOSIS — H409 Unspecified glaucoma: Secondary | ICD-10-CM | POA: Diagnosis not present

## 2023-03-31 DIAGNOSIS — E876 Hypokalemia: Secondary | ICD-10-CM | POA: Diagnosis not present

## 2023-03-31 DIAGNOSIS — N319 Neuromuscular dysfunction of bladder, unspecified: Secondary | ICD-10-CM | POA: Diagnosis not present

## 2023-03-31 DIAGNOSIS — Z833 Family history of diabetes mellitus: Secondary | ICD-10-CM | POA: Diagnosis not present

## 2023-03-31 NOTE — Progress Notes (Addendum)
[name] ([id number]) [id number] Page 1 of 11 Visit Report for 03/31/2023 Allergy List Details Patient Name: Date of Service: [name] [facility], [name] 03/31/2023 9:30 A M Medical Record Number: [id number] Patient Account Number: [id number] Date of Birth/Sex: Treating RN: [birth year] (57 y.o. [name]) [name] Primary Care Mildreth Reek: Aram Beecham Other Clinician: Referring Arianna Haydon: Treating Kadija Cruzen/Extender: Allen Derry Self, Referral Weeks in Treatment: 0 Allergies Active Allergies Sulfa (Sulfonamide Antibiotics) latex morphine Iodinated Contrast Media Severity: Severe Allergy Notes Electronic Signature(s) Signed: 03/31/2023 9:52:12 AM By: [name] RN Entered By: [name] on 03/31/2023 09:52:12 -------------------------------------------------------------------------------- Arrival Information Details Patient Name: Date of Service: [name] TH, [name] 03/31/2023 9:30 A M Medical Record Number: [id number] Patient Account Number: [id number] Date of Birth/Sex: Treating RN: [birth year] (57 y.o. [name]) [name] Primary Care Karessa Onorato: Aram Beecham Other Clinician: Referring Jarek Longton: Treating Alexys Gassett/Extender: Allen Derry Self, Referral Weeks in Treatment: 0 Visit Information Patient Arrived: Wheel Chair Arrival Time: 09:12 Accompanied By: self Transfer Assistance: None Patient Identification Verified: Yes Secondary Verification Process Completed: Yes Patient Requires Transmission-Based Precautions: No Patient Has Alerts: No ADONI, GREENOUGH ([id number]) Patient Has Alerts: No History Since Last Visit Added or deleted any medications: No Any new allergies or adverse reactions: No Had a fall or experienced change in activities of daily living that may affect risk of falls: No Signs or symptoms of abuse/neglect since last visito No Hospitalized since last visit: No Implantable device outside of the clinic excluding cellular tissue  based products placed in the center since last visit: No [id number] Page 2 of 11 Has Dressing in Place as Prescribed: Yes Pain Present Now: No Electronic Signature(s) Signed: 03/31/2023 9:35:04 AM By: [name] RN Entered By: [name] on 03/31/2023 09:35:03 -------------------------------------------------------------------------------- Clinic Level of Care Assessment Details Patient Name: Date of Service: [name] [facility], [name] 03/31/2023 9:30 A M Medical Record Number: [id number] Patient Account Number: [id number] Date of Birth/Sex: Treating RN: 11-08-1965 (57 y.o. [name]) [name] Primary Care Julieanna Geraci: Aram Beecham Other Clinician: Referring Adama Ivins: Treating Kade Demicco/Extender: Allen Derry Self, Referral Weeks in Treatment: 0 Clinic Level of Care Assessment Items TOOL 2 Quantity Score X- 1 0 Use when only an EandM is performed on the INITIAL visit ASSESSMENTS - Nursing Assessment / Reassessment X- 1 20 General Physical Exam (combine w/ comprehensive assessment (listed just below) when performed on new pt. evals) X- 1 25 Comprehensive Assessment (HX, ROS, Risk Assessments, Wounds Hx, etc.) ASSESSMENTS - Wound and Skin A ssessment / Reassessment []  - 0 Simple Wound Assessment / Reassessment - one wound X- 2 5 Complex Wound Assessment / Reassessment - multiple wounds []  - 0 Dermatologic / Skin Assessment (not related to wound area) ASSESSMENTS - Ostomy and/or Continence Assessment and Care []  - 0 Incontinence Assessment and Management []  - 0 Ostomy Care Assessment and Management (repouching, etc.) PROCESS - Coordination of Care X - Simple Patient / Family Education for ongoing care 1 15 []  - 0 Complex (extensive) Patient / Family Education for ongoing care []  - 0 Staff obtains Chiropractor, Records, T Results / Process Orders est []  - 0 Staff telephones HHA, Nursing Homes / Clarify orders / etc []  - 0 Routine Transfer to another Facility  (non-emergent condition) []  - 0 Routine Hospital Admission (non-emergent condition) X- 1 15 New Admissions / Manufacturing engineer / Ordering NPWT Apligraf, etc. , []  - 0 Emergency Hospital Admission (emergent condition) X- 1 10 Simple Discharge Coordination []  - 0 Complex (extensive) Discharge Coordination PROCESS - Special  Needs []  - 0 Pediatric / Minor Patient Management JULYAN, GALES ([id number]) [id number] Page 3 of 11 []  - 0 Isolation Patient Management []  - 0 Hearing / Language / Visual special needs []  - 0 Assessment of Community assistance (transportation, D/C planning, etc.) []  - 0 Additional assistance / Altered mentation []  - 0 Support Surface(s) Assessment (bed, cushion, seat, etc.) INTERVENTIONS - Wound Cleansing / Measurement X- 1 5 Wound Imaging (photographs - any number of wounds) []  - 0 Wound Tracing (instead of photographs) []  - 0 Simple Wound Measurement - one wound X- 3 5 Complex Wound Measurement - multiple wounds []  - 0 Simple Wound Cleansing - one wound X- 3 5 Complex Wound Cleansing - multiple wounds INTERVENTIONS - Wound Dressings X - Small Wound Dressing one or multiple wounds 3 10 []  - 0 Medium Wound Dressing one or multiple wounds []  - 0 Large Wound Dressing one or multiple wounds []  - 0 Application of Medications - injection INTERVENTIONS - Miscellaneous []  - 0 External ear exam []  - 0 Specimen Collection (cultures, biopsies, blood, body fluids, etc.) []  - 0 Specimen(s) / Culture(s) sent or taken to Lab for analysis []  - 0 Patient Transfer (multiple staff / Nurse, adult / Similar devices) []  - 0 Simple Staple / Suture removal (25 or less) []  - 0 Complex Staple / Suture removal (26 or more) []  - 0 Hypo / Hyperglycemic Management (close monitor of Blood Glucose) []  - 0 Ankle / Brachial Index (ABI) - do not check if billed separately Has the patient been seen at the hospital within the last three  years: Yes Total Score: 160 Level Of Care: New/Established - Level 5 Electronic Signature(s) Signed: 03/31/2023 4:10:11 PM By: [name] RN Entered By: [name] on 03/31/2023 10:35:19 -------------------------------------------------------------------------------- Encounter Discharge Information Details Patient Name: Date of Service: [name] TH, [name] 03/31/2023 9:30 A M Medical Record Number: [id number] Patient Account Number: [id number] Date of Birth/Sex: Treating RN: Aug 10, 1966 (56 y.o. [name] Primary Care Annah Jasko: Aram Beecham Other Clinician: Referring Maxfield Gildersleeve: Treating Florence Yeung/Extender: Allen Derry Self, Referral Weeks in TreatmentIzora Gala [place], Dorinda [name] ([id number]) [id number] Page 4 of 11 Encounter Discharge Information Items [name] Procedure Vitals Discharge Condition: Stable Temperature (F): 98.2 Ambulatory Status: Wheelchair Pulse (bpm): 85 Discharge Destination: Home Respiratory Rate (breaths/min): 16 Transportation: Other Blood Pressure (mmHg): 92/62 Accompanied By: self Schedule Follow-up Appointment: Yes Clinical Summary of Care: Electronic Signature(s) Signed: 03/31/2023 10:42:25 AM By: [name] RN Entered By: [name] on 03/31/2023 10:42:24 -------------------------------------------------------------------------------- Lower Extremity Assessment Details Patient Name: Date of Service: [name] [facility], [name] 03/31/2023 9:30 A M Medical Record Number: [id number] Patient Account Number: [id number] Date of Birth/Sex: Treating RN: 1965-10-20 (56 y.o. [name] Primary Care Saachi Zale: Aram Beecham Other Clinician: Referring Zoha Spranger: Treating Jonny Dearden/Extender: Allen Derry Self, Referral Weeks in Treatment: 0 Electronic Signature(s) Signed: 03/31/2023 9:52:08 AM By: [name] RN Entered By: [name] on 03/31/2023  09:52:08 -------------------------------------------------------------------------------- Multi Wound Chart Details Patient Name: Date of Service: [name] TH, [name] 03/31/2023 9:30 A M Medical Record Number: [id number] Patient Account Number: [id number] Date of Birth/Sex: Treating RN: [birth year] (56 y.o. [name] Primary Care Teagen Mcleary: Aram Beecham Other Clinician: Referring Abimelec Grochowski: Treating Shoua Ulloa/Extender: Allen Derry Self, Referral Weeks in Treatment: 0 Vital Signs Height(in): 59 Pulse(bpm): 85 Weight(lbs): 90 Blood Pressure(mmHg): 92/62 Body Mass Index(BMI): 18.2 Temperature(F): 98.2 Respiratory Rate(breaths/min): 16 [5:Photos:] [N/A:N/A [id number] Page 5 of 11] Right, Midline Gluteus Right, Lateral Gluteus N/A Wound Location: Gradually  Appeared Gradually Appeared N/A Wounding Event: Pressure Ulcer Pressure Ulcer N/A Primary Etiology: 10/04/2021 10/04/2021 N/A Date Acquired: 0 0 N/A Weeks of Treatment: Open Open N/A Wound Status: No No N/A Wound Recurrence: 5x2.5x1.5 1.5x1.7x0.2 N/A Measurements L x W x D (cm) 9.817 2.003 N/A A (cm) : rea 14.726 0.401 N/A Volume (cm) : Category/Stage IV Category/Stage III N/A Classification: Medium Medium N/A Exudate A mount: Serosanguineous Serosanguineous N/A Exudate Type: red, brown red, brown N/A Exudate Color: Large (67-100%) Large (67-100%) N/A Granulation A mount: Red Red N/A Granulation Quality: Small (1-33%) Small (1-33%) N/A Necrotic A mount: Fat Layer (Subcutaneous Tissue): Yes Fat Layer (Subcutaneous Tissue): Yes N/A Exposed Structures: Fascia: No Fascia: No Tendon: No Tendon: No Muscle: No Muscle: No Joint: No Joint: No Bone: No Bone: No N/A None N/A Epithelialization: Treatment Notes Electronic Signature(s) Signed: 03/31/2023 9:55:22 AM By: [name] RN Entered By: [name] on 03/31/2023  09:55:22 -------------------------------------------------------------------------------- Multi-Disciplinary Care Plan Details Patient Name: Date of Service: [name] TH, [name] 03/31/2023 9:30 A M Medical Record Number: [id number] Patient Account Number: [id number] Date of Birth/Sex: Treating RN: Feb 06, 1966 (56 y.o. [name]) [name] Primary Care Brinleigh Tew: Aram Beecham Other Clinician: Referring Jessyka Austria: Treating Chenoah Mcnally/Extender: Allen Derry Self, Referral Weeks in Treatment: 0 Active Inactive Pressure Nursing Diagnoses: Potential for impaired tissue integrity related to pressure, friction, moisture, and shear Goals: Patient will remain free from development of additional pressure ulcers Date Initiated: 03/31/2023 Target Resolution Date: 04/30/2023 Goal Status: Active Interventions: Assess: immobility, friction, shearing, incontinence upon admission and as needed LORETO, LOESCHER ([id number]) [id number] Page 6 of 11 Assess offloading mechanisms upon admission and as needed Assess potential for pressure ulcer upon admission and as needed Notes: Wound/Skin Impairment Nursing Diagnoses: Knowledge deficit related to ulceration/compromised skin integrity Goals: Patient/caregiver will verbalize understanding of skin care regimen Date Initiated: 03/31/2023 Target Resolution Date: 04/30/2023 Goal Status: Active Ulcer/skin breakdown will have a volume reduction of 30% by week 4 Date Initiated: 03/31/2023 Target Resolution Date: 05/31/2023 Goal Status: Active Ulcer/skin breakdown will have a volume reduction of 50% by week 8 Date Initiated: 03/31/2023 Target Resolution Date: 07/01/2023 Goal Status: Active Ulcer/skin breakdown will have a volume reduction of 80% by week 12 Date Initiated: 03/31/2023 Target Resolution Date: 07/31/2023 Goal Status: Active Ulcer/skin breakdown will heal within 14 weeks Date Initiated: 03/31/2023 Target Resolution Date:  08/31/2023 Goal Status: Active Interventions: Assess patient/caregiver ability to obtain necessary supplies Assess patient/caregiver ability to perform ulcer/skin care regimen upon admission and as needed Assess ulceration(s) every visit Notes: Electronic Signature(s) Signed: 03/31/2023 9:58:46 AM By: [name] RN Entered By: [name] on 03/31/2023 09:58:46 -------------------------------------------------------------------------------- Pain Assessment Details Patient Name: Date of ServiceEulas [name] [facility], [name] 03/31/2023 9:30 A M Medical Record Number: [id number] Patient Account Number: [id number] Date of Birth/Sex: Treating RN: 1965-11-08 (56 y.o. [name] Primary Care Ronin Crager: Aram Beecham Other Clinician: Referring Westyn Keatley: Treating Leighla Chestnutt/Extender: Allen Derry Self, Referral Weeks in Treatment: 0 Active Problems Location of Pain Severity and Description of Pain Patient Has Paino No Site Locations [place], [place] ([id number]) [id number] Page 7 of 11 Pain Management and Medication Current Pain Management: Electronic Signature(s) Signed: 03/31/2023 9:35:10 AM By: [name] RN Entered By: [name] on 03/31/2023 09:35:10 -------------------------------------------------------------------------------- Patient/Caregiver Education Details Patient Name: Date of Service: [name] TH, [name] 6/27/2024andnbsp9:30 A M Medical Record Number: [id number] Patient Account Number: [id number] Date of Birth/Gender: Treating RN: 01-28-1966 (56 y.o. [name] Primary Care Physician: Aram Beecham Other Clinician: Referring  Physician: Treating Physician/Extender: Allen Derry Self, Referral Weeks in Treatment: 0 Education Assessment Education Provided To: Patient Education Topics Provided Welcome [facility]New [facility] Packet: o Handouts: Welcome T [facility] o Methods: Explain/Verbal Responses:  State content correctly Electronic Signature(s) Signed: 03/31/2023 4:10:11 PM By: [name] RN Entered By: [name] on 03/31/2023 09:59:01 [name] ([id number]) [id number] Page 8 of 11 -------------------------------------------------------------------------------- Wound Assessment Details Patient Name: Date of Service: [name] [facility], [name] 03/31/2023 9:30 A M Medical Record Number: [id number] Patient Account Number: [id number] Date of Birth/Sex: Treating RN: 1966/08/05 (56 y.o. [name]) [name] Primary Care Derrius Furtick: Aram Beecham Other Clinician: Referring Mahayla Haddaway: Treating Payden Bonus/Extender: Allen Derry Self, Referral Weeks in Treatment: 0 Wound Status Wound Number: 5 Primary Etiology: Pressure Ulcer Wound Location: Right, Midline Gluteus Wound Status: Open Wounding Event: Gradually Appeared Date Acquired: 10/04/2021 Weeks Of Treatment: 0 Clustered Wound: No Photos Wound Measurements Length: (cm) 5 Width: (cm) 2.5 Depth: (cm) 1.5 Area: (cm) 9.817 Volume: (cm) 14.726 % Reduction in Area: % Reduction in Volume: Tunneling: No Undermining: No Wound Description Classification: Category/Stage IV Exudate Amount: Medium Exudate Type: Serosanguineous Exudate Color: red, brown Foul Odor After Cleansing: No Slough/Fibrino Yes Wound Bed Granulation Amount: Large (67-100%) Exposed Structure Granulation Quality: Red Fascia Exposed: No Necrotic Amount: Small (1-33%) Fat Layer (Subcutaneous Tissue) Exposed: Yes Necrotic Quality: Adherent Slough Tendon Exposed: No Muscle Exposed: No Joint Exposed: No Bone Exposed: No Treatment Notes Wound #5 (Gluteus) Wound Laterality: Right, Midline Cleanser Byram Ancillary Kit - 15 Day Supply Discharge Instruction: Use supplies as instructed; Kit contains: (15) Saline Bullets; (15) 3x3 Gauze; 15 pr Gloves Peri-Wound Care [place], Dorinda [name] ([id number]) [id number] Page 9  of 11 Topical Primary Dressing Hydrofera Blue Ready Transfer Foam, 4x5 (in/in) Discharge Instruction: Apply Hydrofera Blue Ready to wound bed as directed Secondary Dressing (BORDER) Zetuvit Plus SILICONE BORDER Dressing 4x4 (in/in) Discharge Instruction: Please do not put silicone bordered dressings under wraps. Use non-bordered dressing only. Secured With Compression Wrap Compression Stockings Facilities manager) Signed: 03/31/2023 9:42:37 AM By: [name] RN Entered By: [name] on 03/31/2023 09:42:37 -------------------------------------------------------------------------------- Wound Assessment Details Patient Name: Date of Service: [name] [facility], [name] 03/31/2023 9:30 A M Medical Record Number: [id number] Patient Account Number: [id number] Date of Birth/Sex: Treating RN: December 12, 1965 (56 y.o. [name]) [name] Primary Care Ezrael Sam: Aram Beecham Other Clinician: Referring Cinthia Rodden: Treating Krystin Keeven/Extender: Allen Derry Self, Referral Weeks in Treatment: 0 Wound Status Wound Number: 6 Primary Etiology: Pressure Ulcer Wound Location: Right, Lateral Gluteus Wound Status: Open Wounding Event: Gradually Appeared Date Acquired: 10/04/2021 Weeks Of Treatment: 0 Clustered Wound: No Photos Wound Measurements Length: (cm) 1.5 Width: (cm) 1.7 Depth: (cm) 0.2 Area: (cm) 2.003 Volume: (cm) 0.401 % Reduction in Area: % Reduction in Volume: Epithelialization: None Tunneling: No Undermining: No Wound Description KYLLIAN, CLINGERMAN ([id number]) Classification: Category/Stage III Exudate Amount: Medium Exudate Type: Serosanguineous Exudate Color: red, brown [id number] Page 10 of 11 Foul Odor After Cleansing: No Slough/Fibrino Yes Wound Bed Granulation Amount: Large (67-100%) Exposed Structure Granulation Quality: Red Fascia Exposed: No Necrotic Amount: Small (1-33%) Fat Layer (Subcutaneous Tissue) Exposed: Yes Necrotic  Quality: Adherent Slough Tendon Exposed: No Muscle Exposed: No Joint Exposed: No Bone Exposed: No Treatment Notes Wound #6 (Gluteus) Wound Laterality: Right, Lateral Cleanser Byram Ancillary Kit - 15 Day Supply Discharge Instruction: Use supplies as instructed; Kit contains: (15) Saline Bullets; (15) 3x3 Gauze; 15 pr Gloves Peri-Wound Care Topical Primary Dressing Hydrofera Blue Ready Transfer Foam, 4x5 (  in/in) Discharge Instruction: Apply Hydrofera Blue Ready to wound bed as directed Secondary Dressing (BORDER) Zetuvit Plus SILICONE BORDER Dressing 4x4 (in/in) Discharge Instruction: Please do not put silicone bordered dressings under wraps. Use non-bordered dressing only. Secured With Compression Wrap Compression Stockings Facilities manager) Signed: 03/31/2023 9:51:55 AM By: [name] RN Entered By: [name] on 03/31/2023 09:51:55 -------------------------------------------------------------------------------- Vitals Details Patient Name: Date of Service: [name] TH, [name] 03/31/2023 9:30 A M Medical Record Number: [id number] Patient Account Number: [id number] Date of Birth/Sex: Treating RN: 03-27-1966 (56 y.o. [name]) [name] Primary Care Treanna Dumler: Aram Beecham Other Clinician: Referring Melynda Krzywicki: Treating Basem Yannuzzi/Extender: Allen Derry Self, Referral Weeks in Treatment: 0 Vital Signs Time Taken: 09:14 Temperature (F): 98.2 Height (in): 59 Pulse (bpm): 85 Source: Stated Respiratory Rate (breaths/min): 16 Weight (lbs): 90 Blood Pressure (mmHg): 92/62 Source: Stated Reference Range: 80 - 120 mg / dl Body Mass Index (BMI): 18.2 [place], Dorinda [name] ([id number]) [id number] Page 11 of 11 Electronic Signature(s) Signed: 03/31/2023 9:35:56 AM By: [name] RN Entered By: [name] on 03/31/2023 09:35:56

## 2023-03-31 NOTE — Progress Notes (Signed)
[place], [place] ([id number]) [id number] Page 1 of 5 Visit Report for 03/31/2023 Abuse Risk Screen Details Patient Name: Date of Service: [name] [facility] [name] [name], [name] 03/31/2023 9:30 A M Medical Record Number: [id number] Patient Account Number: [id number] Date of Birth/Sex: Treating RN: Feb 09, 1966 (57 y.o. Male) [name] Primary Care Nahara Dona: Aram Beecham Other Clinician: Referring Maurina Fawaz: Treating Sanjana Folz/Extender: Allen Derry Self, Referral Weeks in Treatment: 0 Abuse Risk Screen Items Answer ABUSE RISK SCREEN: Has anyone close to you tried to hurt or harm you recentlyo No Chase you feel uncomfortable with anyone in your familyo No Has anyone forced you Chase things that you didnt want to doo No Electronic Signature(s) Signed: 03/31/2023 9:52:48 AM By: [name] RN Entered By: [name] on 03/31/2023 09:52:48 -------------------------------------------------------------------------------- Activities of Daily Living Details Patient Name: Date of ServiceEulas [name] [facility] [name], [name] 03/31/2023 9:30 A M Medical Record Number: [id number] Patient Account Number: [id number] Date of Birth/Sex: Treating RN: 1966/06/12 (57 y.o. Male) [name] Primary Care Margorie Renner: Aram Beecham Other Clinician: Referring Leigh Kaeding: Treating Casandra Dallaire/Extender: Allen Derry Self, Referral Weeks in Treatment: 0 Activities of Daily Living Items Answer Activities of Daily Living (Please select one for each item) Drive Automobile Not Able T Medications ake Need Assistance Use T elephone Need Assistance Care for Appearance Need Assistance Use T oilet Need Assistance Bath / Shower Need Assistance Dress Self Need Assistance Feed Self Need Assistance Walk Not Able Get In / Out Bed Need Assistance Housework Not [place], [place] ([id number]) [phone] [email or web address] Page 2 of 5 Prepare Meals Need Assistance Handle Money Not Able Shop for Self  Need Assistance Electronic Signature(s) Signed: 03/31/2023 9:53:35 AM By: [name] RN Entered By: [name] on 03/31/2023 09:53:35 -------------------------------------------------------------------------------- Education Screening Details Patient Name: Date of Service: [name] [name], [name] 03/31/2023 9:30 A M Medical Record Number: [id number] Patient Account Number: [id number] Date of Birth/Sex: Treating RN: [birth year] (56 y.o. Male) [name] Primary Care Dejaun Vidrio: Aram Beecham Other Clinician: Referring Emanuele Mcwhirter: Treating Fontella Shan/Extender: Allen Derry Self, Referral Weeks in Treatment: 0 Primary Learner Assessed: Patient Learning Preferences/Education Level/Primary Language Learning Preference: Explanation Highest Education Level: High School Preferred Language: English Cognitive Barrier Language Barrier: No Translator Needed: No Memory Deficit: No Emotional Barrier: No Cultural/Religious Beliefs Affecting Medical Care: No Physical Barrier Impaired Vision: Yes Glasses Impaired Hearing: No Decreased Hand dexterity: No Knowledge/Comprehension Knowledge Level: Medium Comprehension Level: High Ability to understand written instructions: High Ability to understand verbal instructions: High Motivation Anxiety Level: Anxious Cooperation: Cooperative Education Importance: Acknowledges Need Interest in Health Problems: Asks Questions Perception: Coherent Willingness to Engage in Self-Management Medium Activities: Readiness to Engage in Self-Management Medium Activities: Electronic Signature(s) Signed: 03/31/2023 9:54:10 AM By: [name] RN Entered By: [name] on 03/31/2023 09:54:09 [name] ([id number]) [id number] Page 3 of 5 -------------------------------------------------------------------------------- Fall Risk Assessment Details Patient Name: Date of Service: [name] [facility] [name], [name] 03/31/2023 9:30 A  M Medical Record Number: [id number] Patient Account Number: [id number] Date of Birth/Sex: Treating RN: June 05, 1966 (57 y.o. Male) [name] Primary Care Denisia Harpole: Aram Beecham Other Clinician: Referring Rashun Grattan: Treating Shadeed Colberg/Extender: Allen Derry Self, Referral Weeks in Treatment: 0 Fall Risk Assessment Items Have you had 2 or more falls in the last 12 monthso 0 No Have you had any fall that resulted in injury in the last 12 monthso 0 No FALLS RISK SCREEN History of falling - immediate or within 3 months 0 No Secondary diagnosis (Chase you have 2 or more medical diagnoseso) 0  No Ambulatory aid None/bed rest/wheelchair/nurse 0 Yes Crutches/cane/walker 0 No Furniture 0 No Intravenous therapy Access/Saline/Heparin Lock 0 No Gait/Transferring Normal/ bed rest/ wheelchair 0 Yes Weak (short steps with or without shuffle, stooped but able to lift head while walking, may seek 0 No support from furniture) Impaired (short steps with shuffle, may have difficulty arising from chair, head down, impaired 0 No balance) Mental Status Oriented to own ability 0 No Electronic Signature(s) Signed: 03/31/2023 9:54:40 AM By: [name] RN Entered By: [name] on 03/31/2023 09:54:40 -------------------------------------------------------------------------------- Foot Assessment Details Patient Name: Date of Service: [name] [name], [name] 03/31/2023 9:30 A M Medical Record Number: [id number] Patient Account Number: [id number] Date of Birth/Sex: Treating RN: 06-13-1966 (56 y.o. Male) [name] Primary Care Allix Blomquist: Aram Beecham Other Clinician: Referring Imanuel Pruiett: Treating Mareena Cavan/Extender: Allen Derry Self, Referral Weeks in Treatment: 0 Foot Assessment Items Site Locations [place], [place] ([id number]) [id number] [id number] Page 4 of 5 + = Sensation present, - = Sensation absent, C = Callus, U = Ulcer R = Redness, W = Warmth, M = Maceration, PU =  Pre-ulcerative lesion F = Fissure, S = Swelling, D = Dryness Assessment Right: Left: Other Deformity: No No Prior Foot Ulcer: No No Prior Amputation: No No Charcot Joint: No No Ambulatory Status: Non-ambulatory Assistance Device: Wheelchair Gait: Surveyor, mining) Signed: 03/31/2023 9:55:15 AM By: [name] RN Entered By: [name] on 03/31/2023 09:55:15 -------------------------------------------------------------------------------- Nutrition Risk Screening Details Patient Name: Date of ServiceEulas [name] [facility] [name], [name] 03/31/2023 9:30 A M Medical Record Number: [id number] Patient Account Number: [id number] Date of Birth/Sex: Treating RN: [birth year] (57 y.o. Male) [name] Primary Care Payton Prinsen: Aram Beecham Other Clinician: Referring Hester Forget: Treating Lille Karim/Extender: Allen Derry Self, Referral Weeks in Treatment: 0 Height (in): 59 Weight (lbs): 90 Body Mass Index (BMI): 18.2 Nutrition Risk Screening Items Score Screening NUTRITION RISK SCREEN: I have an illness or condition that made me change the kind and/or amount of food I eat 0 No I eat fewer than two meals per day 0 No I eat few fruits and vegetables, or milk products 0 No I have three or more drinks of beer, liquor or wine almost every day 0 No I have tooth or mouth problems that make it hard for me to eat 0 No [place], [place] ([id number]) [phone] [id number].pdf Page 5 of 5 I don't always have enough money to buy the food I need 0 No I eat alone most of the time 0 No I take three or more different prescribed or over-the-counter drugs a day 1 Yes Without wanting to, I have lost or gained 10 pounds in the last six months 0 No I am not always physically able to shop, cook and/or feed myself 2 Yes Nutrition Protocols Good Risk Protocol Moderate Risk Protocol 0 Provide education on nutrition High Risk Proctocol Risk Level: Moderate Risk Score: 3 Electronic  Signature(s) Signed: 03/31/2023 9:55:03 AM By: [name] RN Entered By: [name] on 03/31/2023 09:55:03

## 2023-03-31 NOTE — Progress Notes (Addendum)
[place], [name] ([id number]) [id number] Page 1 of 13 Visit Report for 03/31/2023 Chief Complaint Document Details Patient Name: Date of Service: Chase [name] [facility], [name] 03/31/2023 9:30 A M Medical Record Number: [id number] Patient Account Number: [id number] Date of Birth/Sex: Treating RN: 03/30/1966 (56 y.o. [name]) [name] Primary Care Provider: Aram Beecham Other Clinician: Referring Provider: Treating Provider/Extender: Allen Derry Self, Referral Weeks in Treatment: 0 Information Obtained from: Patient Chief Complaint Right ischial tuberosity pressure ulcer Electronic Signature(s) Signed: 03/31/2023 9:46:10 AM By: Allen Derry PA-C Entered By: Allen Derry on 03/31/2023 09:46:09 -------------------------------------------------------------------------------- Debridement Details Patient Name: Date of Service: Chase [name] [facility], [name] 03/31/2023 9:30 A M Medical Record Number: [id number] Patient Account Number: [id number] Date of Birth/Sex: Treating RN: 05-03-1966 (56 y.o. [name]) [name] Primary Care Provider: Aram Beecham Other Clinician: Referring Provider: Treating Provider/Extender: Allen Derry Self, Referral Weeks in Treatment: 0 Debridement Performed for Assessment: Wound #5 Right,Midline Gluteus Performed By: Physician Allen Derry, PA-C Debridement Type: Chemical/Enzymatic/Mechanical Agent Used: saline gauze Level of Consciousness (Pre-procedure): Awake and Alert Pre-procedure Verification/Time Out No Taken: Start Time: 09:45 Percent of Wound Bed Debrided: Instrument: Other : saline gauze Bleeding: None End Time: 09:49 Procedural Pain: 0 [name] Procedural Pain: 0 Response to Treatment: Procedure was tolerated well Level of Consciousness ([name] Awake and Alert procedure): [name] Debridement Measurements of Total Wound Chase [name] ([id number]) [id number] Page 2 of 13 Length: (cm) 5 Stage: Category/Stage  IV Width: (cm) 2.5 Depth: (cm) 1.5 Volume: (cm) 14.726 Character of Wound/Ulcer [name] Debridement: Improved [name] Procedure Diagnosis Same as Pre-procedure Electronic Signature(s) Signed: 03/31/2023 10:31:06 AM By: [name] RN Signed: 04/01/2023 1:40:48 PM By: Allen Derry PA-C Entered By: [name] on 03/31/2023 10:31:06 -------------------------------------------------------------------------------- Debridement Details Patient Name: Date of Service: Chase [name] [facility], [name] 03/31/2023 9:30 A M Medical Record Number: [id number] Patient Account Number: [id number] Date of Birth/Sex: Treating RN: 01-May-1966 (56 y.o. [name]) [name] Primary Care Provider: Aram Beecham Other Clinician: Referring Provider: Treating Provider/Extender: Allen Derry Self, Referral Weeks in Treatment: 0 Debridement Performed for Assessment: Wound #6 Right,Lateral Gluteus Performed By: Physician Allen Derry, PA-C Debridement Type: Chemical/Enzymatic/Mechanical Agent Used: saline gauze Level of Consciousness (Pre-procedure): Awake and Alert Pre-procedure Verification/Time Out No Taken: Start Time: 09:45 Percent of Wound Bed Debrided: Instrument: Other : saline gauze Bleeding: None End Time: 09:49 Procedural Pain: 0 [name] Procedural Pain: 0 Response to Treatment: Procedure was tolerated well Level of Consciousness ([name] Awake and Alert procedure): [name] Debridement Measurements of Total Wound Length: (cm) 1.5 Stage: Category/Stage III Width: (cm) 1.7 Depth: (cm) 0.2 Volume: (cm) 0.401 Character of Wound/Ulcer [name] Debridement: Improved [name] Procedure Diagnosis Same as Pre-procedure Electronic Signature(s) Signed: 03/31/2023 10:31:34 AM By: [name] RN Signed: 04/01/2023 1:40:48 PM By: Allen Derry PA-C Entered By: [name] on 03/31/2023 10:31:34 Chase [name] ([id number]) [id number] Page 3 of  13 -------------------------------------------------------------------------------- HPI Details Patient Name: Date of ServiceEulas [name] [facility], [name] 03/31/2023 9:30 A M Medical Record Number: [id number] Patient Account Number: [id number] Date of Birth/Sex: Treating RN: 1966-08-12 (56 y.o. [name]) [name] Primary Care Provider: Aram Beecham Other Clinician: Referring Provider: Treating Provider/Extender: Allen Derry Self, Referral Weeks in Treatment: 0 History of Present Illness HPI Description: 57 year old male with a history of spina bifida presenting to [place] with a history of a open wound on the left gluteal region near his upper thigh which she's had for several weeks. He was seen in the ED recently at [facility] and was put on doxycycline  and was advised some local care. his past medical history is significant for spinal bifid, neurogenic bladder, kidney stones, sacral pressure sores, constipation. Past surgical history significant for partial cystectomy, ileal conduit, VP shunt removal, percutaneous nephro lithotripsy. In the remote past the patient says he's had some treatment for a ulcer on this area and was treated with a skin graft. Readmission: 10/16/2021 upon evaluation today patient appears to be doing somewhat poorly in regard to a fairly large wound noted over the right ischial tuberosity location. This is a stage IV pressure ulcer. Of note this is also positive for osteomyelitis upon a CT scan which was performed during the time that he was admitted in the hospital on 09/19/2021. With that being said it is noted in the right inferior buttock that he has a decubitus ulcer which extends to the posterior toe inferior right ischium where there is evidence of osteomyelitis. When he was here in the office actually missed that this was positive I missed read it and thought it said that there was no osteomyelitis. That is not the case there is in fact osteomyelitis noted  here. I actually did review his notes as well in epic. Looking to the discharge summary it appears that the patient was admitted from 09/19/2021 through 09/23/2021. He was discharged home with home health care according to the note. With that being said from what I understand his sister actually takes care of him at this point. He was also to follow-up with a primary care provider and here at the wound care center within a week. I am just now seeing him on the 13th almost a month after discharge. He does have a history of again osteomyelitis of this right hip/ischial tuberosity location. 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 The reason for his admission was actually worsening of the decubitus ulcer upon admission. He does have chronic osteomyelitis of this area. He was treated with empiric antibiotics he had acute kidney injury which improved with IV fluids he also had hypokalemia. Surgical debridement was performed by general surgery at that time and ID was consulted to assist with management according to the notes. IV antibiotics were recommended but patient declined and wanted oral antibiotics at discharge. Therefore no IV antibiotics were planned for discharge time. This case was under the care of Dr. Joylene Draft while he was in the hospital when she did discharge him with a 2-week course of doxycycline and Augmentin according to the notes. It looks like Dr. Judithann Sheen office did call and leave a message for the patient to get scheduled for a follow-up visit after hospital discharge I do not see where he showed up in fact it appears that the patient has a no-show letter from Dr. Judithann Sheen office noted in epic due to a appointment that was missed on 10/06/2021. Looking at the pictures from Dr. Cristine Polio notes on 09/23/2021 it does appear that the wound is  a little bit better as far as the cleanliness of the surface of the wound today but nonetheless still is a very significant wound. 10/26/2021 upon evaluation today patient appears to be doing okay in regard to his wound. Again this is a wound that he did indeed have osteomyelitis and previous. With that being said I do not see any signs of a worsening infection at this time which  is good news. Overall I think that we are headed in the right direction although still I think he needs more aggressive offloading. 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 I do believe this would be ideal for him to be honest. Readmission: 01-15-2022 upon evaluation today patient presents for reevaluation he was last seen on October 26, 2021. At that time unfortunately he was having issues with a significant ischial pressure ulcer on the right ischial tuberosity. Subsequently he ended up having decent response to the Dakin's moistened gauze dressing that was previously being utilized and subsequently the patient's sister who is his primary caregiver as well states that she decided just to try to manage this at home. Nonetheless unfortunately this is getting a little larger not better. He does spend a lot of the day in his chair. He does have an air mattress according what they tell me today. That was previously ordered. With that being said I do believe that at this time things do appear to be doing much better from the standpoint of infection I do not see any signs of overt infection. There is also no evidence of systemic infection. No fevers, chills, nausea, vomiting, or diarrhea. With that being said I do believe based on what I am seeing the wound is a bit cleaner and he possibly could be an excellent candidate for a wound VAC. 02-05-2022 upon evaluation today patient appears to be doing about the same in regard to his wound. Unfortunately he is not any better but he also really has not had the  wound VAC on sufficiently. There is been some confusion with the orders part of that I think is our follow-up with the way the order was written part of it may be with how the wound VAC is being placed either way we need to see what we can do to try to improve things in this regard. We will clarify the orders today going forward for home health. Unfortunately the patient does have a new wound today which is on the opposite side. This is not good 1 was bad enough have another area to open is definitely not a good thing. Its not as deep but nonetheless is also not very shallow either. We may potentially be able to get this to the point that we could bridge the 2 together in order to wound VAC both but right now want a focus on to try to get the wound VAC going for the main wound initially we will likely use Hydrofera Blue on the new wound.Marland Kitchen 02-19-2022 upon evaluation today patient's wound appears to be doing a little bit worse in the way of maceration fortunately there is no signs of active infection locally or systemically which is great news. No fevers, chills, nausea, vomiting, or diarrhea. 03-05-2022 upon evaluation today patient unfortunately does not appear to be doing nearly as good in regard to his wounds as I would like to see. Fortunately I do not see any evidence of active infection that is obvious although I am can obtain a wound culture to ensure that there is not anything getting worse here as far as the wound without the wound VAC on his concern. With that being said I will go ahead and get this sent in and evaluated will initiate treatment as needed going forward if necessary. With regard to the wound with the wound VAC this continues to be placed directly over the wound and there is obvious signs  of deep tissue injury. The suction being here means that he is sitting on it I think this may be part of the reason why it is coming off although not 100% certain. [place], [name] ([id number])  [id number] Page 4 of 13 03-12-2022 upon evaluation today patient still has not gotten the antibiotics that were called in for him 4 days ago. He tells me that his sister just told him this morning that they were ready but that she has not had a chance to go pick them up. Nonetheless I feel like that this is really something he needs any should have been taken that not the other antibiotics which were not doing the job for him to be honest. The patient states and I completely agree that there is really no way he would have known relying on his sister to let him know obviously. Nonetheless I do believe that he needs to not be taking any other antibiotics and be on the Levaquin as soon as possible. He tells me that she said she was going to pick them up today. In regard to home health I am hopeful that they actually have been bridging this now. I do think that the wound looks better and this is good news. With that being said I am not certain that I can really tell for sure how this was attached it was removed before he came in and the black foam left in place. I really want him to leave the wound VAC in place until he comes in so that we can monitor and make sure that this is being done correctly he voiced understanding. 03-19-2022 upon evaluation today and much happier with how things appear as far as the patient's wounds are concerned. I think he is on a much better track and they are doing a better job at bridging although its not quite where I like to see it is still more posterior which I think the patient needs to be a little bit more lateral on his side. Either way this is something that I did marked with a small dot today for him to tell the home health nurses well so she can bridge it to that location also think the tubing should go up instead of going down to just make it less cumbersome overall for him. 04-19-2022 upon evaluation today patient appears to be doing a  little better in regard to his wounds. Fortunately I do not see any signs of infection I do believe he is on the right track. The wound VAC does seem to be helping to clean the wound up a bit and bring it in from the base at work. This is good news. 05-10-2022 upon evaluation today patient appears to be doing well currently in regard to his wounds in general in fact of the smaller of the 2 wounds without the wound VAC actually appears to be doing so much better this is almost completely closed and very pleased in that regard. He has been staying off of this he tells me and shows. With that being said the wound with the wound VAC is going require some sharp debridement there is some tissue which is not quite as healthy and we are going to go ahead and continue that for probably 2 more weeks although I think we may be closer to discontinuing this as well since it has healed and quite significantly. 05-24-2022 upon evaluation today patient appears to be doing well with regard to his wound in fact  this looks better than I have been expected. This actually I think is a good time to go ahead and switch away from the wound VAC based on what I am seeing. I think we go to [facility] with a bordered foam dressing. 06-21-2022 upon evaluation today patient appears to be doing well currently in regard to his wound this is measuring smaller and looks well. Fortunately there does not appear to be any signs of active infection locally or systemically at this time which is great news. No fevers, chills, nausea, vomiting, or diarrhea. 07-29-2022 upon evaluation today patient appears to be doing well currently in regard to his wound. This is actually measuring smaller although there is a lot of hypergranulation noted. Fortunately there does not appear to be any signs of active infection locally or systemically at this time. No fevers, chills, nausea, vomiting, or diarrhea. 11/9; pressure ulcer on the right buttock in  the setting of spina bifida and lower extremity paralysis. [facility]02-2022 upon evaluation today patient appears to be doing well currently in regard to his wound. We have been using Hydrofera Blue this is showing signs of improvement as far as getting smaller is concerned. There is some drainage around the edges of the wound but I think a lot of this is due to the fact he did not even have a dressing on that he came in today. 12/29; right buttock pressure area which was initially stage IV. This is in the setting of a patient with spina bifida spends most of his time in a wheelchair. I am not certain how much he is offloading this. He has been using for [facility] as a primary dressing. When he was here the last visit he was felt to have hyper granulated tissue and was treated with silver nitrate chemical cauterization 10-14-2022 upon evaluation today patient appears to be doing poorly in regard to the wound in the right ischial location. Unfortunately he tells me that the dressing seems to be falling off frequently. Unfortunately I think that this has contributed to him developing an infection which appears to be exemplified by the fact that he has bone exposed which is actually necrotic and working its way out of the wound up and have to perform a fairly significant debridement today it does appear. I think that he needs to have these dressings ensure that they are in place at all times. Also discussed with the patient that we will get a need to do some testing in order to see if he indeed has an infection. If he does have a bone infection which I think is pretty likely to be osteomyelitis of this ischial location. I discussed that with the patient in detail today and depending on what we see him and make any adjustments in care as far as going forward is concerned. Obviously depressed antibiotic that we gave him that he tells me was somewhat irritating as far as the  stomach side effects are concerned. He states he really would not want to be on that again. Either way I feel he is probably going to be placed on something once we get the results of the culture back. I am honestly concerned about the fact that he comes into the clinic without any dressings in place I am not sure how often they are really staying where they need to be as far as this wound is concerned. I actually cannot remember seeing him anytime in the past  2 months or so at least that he came in with a dressing intact. 10-21-2022 upon evaluation today patient presents for initial inspection here in our clinic concerning issues that he has been having with a wound in the sacral area. Again last time he was here I did debride away necrotic bone which was confirmed on pathology although it was not necessarily confirmed that this was secondary to osteomyelitis on the pathology report. Also the x-ray did not neither confirm nor deny the presence of osteomyelitis and his culture unfortunately showed multiple organisms with nothing predominance of this does not help to rule and tailor down exactly what is going on here. Fortunately I do not see any signs of active infection systemically which is great news. With that being said locally I am definitely seeing some issues here. In fact this is warm to touch around the sacral area and I think that cellulitis is definitely present I just cannot pinpoint the exact organism. With that being said I did discuss with the patient as well today that I do believe that he probably needs an MRI in order to further quantify the extent of her likely osteomyelitis and this again is something that we are going to order this will need to be without contrast due to the fact that he is allergic to contrast dye. 11-18-2022 upon evaluation today patient's wound is actually showing some signs of improvement currently. We have been doing the Dakin's moistened gauze dressing which I  think is helping to clean up the wound for seeing some granulation growing and still the tissue is not quite as healthy as I would love to see but better than previous. He does have osteomyelitis but the good news is he seems to be treated well with the antibiotics he does have a refill that was just picked up and I think that he needs to continue to take that for certain. 3/7; patient is on a 6-week clinic hiatus. He lives at home with his sister however she is not at home all the time. He transfers out of bed and a sliding board. He claims he is only in the in the wheelchair about 4 5 hours. He does have underlying osteomyelitis I am not sure about the status of the antibiotics here. Part of the wound still easily probes to bone 12-23-2022 upon evaluation today patient appears to be doing well currently in regard to his wound all things considered there is still a lot of hypergranulation but I do feel like that with the Vashe moistened gauze is actually doing better than he was previously with the Hydrofera Blue. This just did not seem to be staying in place and with the Vashe through able to change this more frequently. Readmission: 03-31-2023 this is a gentleman whom I have seen multiple times intermittently throughout the years compliance has been a real issue however. I do feel like there is some significant issues here still with the fact that dressing changes and keeping dressings in place are still pretty much as a significant problem here for [place]. I do not see any signs of active infection at this time which is great news but at the same time the wounds do not worse in fact he has another wound different than what I even noted previous. Subsequently he I do believe that the patient should continue to do monitor for any evidence of infection or worsening. Overall I think he needs to have regular follow-up visits with [place] and I think  that he needs to have dressings in place at all times. He tells  me that he is at home primarily by himself transferring himself from the first thing in the morning through at least 3 in the afternoon as his sister works he really needs somebody gets a caregiver with him throughout the day. Electronic Signature(s) Signed: 04/01/2023 12:24:48 PM By: Antionette Fairy, Jawann [phone] By: Allen Derry PA-C [id number] Page 5 of 13 Signed: 04/01/2023 12:24:48 Entered By: Allen Derry on 04/01/2023 12:24:48 -------------------------------------------------------------------------------- Physical Exam Details Patient Name: Date of ServiceEulas [name] [facility], [name] 03/31/2023 9:30 A M Medical Record Number: [id number] Patient Account Number: [id number] Date of Birth/Sex: Treating RN: [birth year] (56 y.o. [name]) [name] Primary Care Provider: Aram Beecham Other Clinician: Referring Provider: Treating Provider/Extender: Allen Derry Self, Referral Weeks in Treatment: 0 Constitutional sitting or standing blood pressure is within target range for patient.. pulse regular and within target range for patient.Marland Kitchen respirations regular, non-labored and within target range for patient.Marland Kitchen temperature within target range for patient.. Well-nourished and well-hydrated in no acute distress. Eyes conjunctiva clear no eyelid edema noted. pupils equal round and reactive to light and accommodation. Ears, Nose, Mouth, and Throat no gross abnormality of ear auricles or external auditory canals. normal hearing noted during conversation. mucus membranes moist. Respiratory normal breathing without difficulty. Musculoskeletal normal gait and posture. no significant deformity or arthritic changes, no loss or range of motion, no clubbing. Psychiatric this patient is able to make decisions and demonstrates good insight into disease process. Alert and Oriented x 3. pleasant and cooperative. Notes Upon inspection patient's wound bed appears really  essentially unchanged even since I last saw him in March. He has a new wound that is more superficial but more lateral compared to the primary ulcer that we have been managing here and unfortunately I think that we are continuing to have some pretty significant issues at this point with getting this healed due to again compliance and keeping dressings in place. We have talked to the patient about this and T to the alk patient sister as well as the sponsor as well. All are in agreement again the patient's 2 appointments regularly transportation should not be an issue at this point. I think the biggest issues going to be cared within the home and keeping his dressings in place, to see if he can talk with the Worker about this. Electronic Signature(s) Signed: 04/01/2023 12:25:41 PM By: Allen Derry PA-C Entered By: Allen Derry on 04/01/2023 12:25:41 -------------------------------------------------------------------------------- Physician Orders Details Patient Name: Date of Service: Chase [name] [facility], [name] 03/31/2023 9:30 A M Medical Record Number: [id number] Patient Account Number: [id number] Date of Birth/Sex: Treating RN: November 24, 1965 (56 y.o. [name] Primary Care Provider: Aram Beecham Other Clinician: Referring Provider: Treating Provider/Extender: Allen Derry Self, Referral Eldon, [name] ([id number]) [id number] Page 6 of 13 Weeks in Treatment: 0 Verbal / Phone Orders: No Diagnosis Coding ICD-10 Coding Code Description L89.314 Pressure ulcer of right buttock, stage 4 Q76.0 Spina bifida occulta Z93.3 Colostomy status I10 Essential (primary) hypertension I25.10 Atherosclerotic heart disease of native coronary artery without angina pectoris Bathing/ Shower/ Hygiene Wash wounds with antibacterial soap and water. Anesthetic (Use 'Patient Medications' Section for Anesthetic Order Entry) Lidocaine applied to wound bed Wound Treatment Wound #5 -  Gluteus Wound Laterality: Right, Midline Cleanser: Byram Ancillary Kit - 15 Day Supply (DME) (Generic) 3 x Per Week/30 Days Discharge Instructions: Use supplies as instructed; Kit contains: (15) Saline Bullets; (15) 3x3  Gauze; 15 pr Gloves Prim Dressing: Hydrofera Blue Ready Transfer Foam, 4x5 (in/in) (DME) (Generic) 3 x Per Week/30 Days ary Discharge Instructions: Apply Hydrofera Blue Ready to wound bed as directed Secondary Dressing: (BORDER) Zetuvit Plus SILICONE BORDER Dressing 4x4 (in/in) (DME) (Generic) 3 x Per Week/30 Days Discharge Instructions: Please do not put silicone bordered dressings under wraps. Use non-bordered dressing only. Wound #6 - Gluteus Wound Laterality: Right, Lateral Cleanser: Byram Ancillary Kit - 15 Day Supply (DME) (Generic) 3 x Per Week/30 Days Discharge Instructions: Use supplies as instructed; Kit contains: (15) Saline Bullets; (15) 3x3 Gauze; 15 pr Gloves Prim Dressing: Hydrofera Blue Ready Transfer Foam, 4x5 (in/in) (DME) (Generic) 3 x Per Week/30 Days ary Discharge Instructions: Apply Hydrofera Blue Ready to wound bed as directed Secondary Dressing: (BORDER) Zetuvit Plus SILICONE BORDER Dressing 4x4 (in/in) (DME) (Generic) 3 x Per Week/30 Days Discharge Instructions: Please do not put silicone bordered dressings under wraps. Use non-bordered dressing only. Electronic Signature(s) Signed: 03/31/2023 10:38:10 AM By: [name] RN Signed: 04/01/2023 1:40:48 PM By: Allen Derry PA-C Entered By: [name] on 03/31/2023 10:38:10 -------------------------------------------------------------------------------- Problem List Details Patient Name: Date of Service: Chase [name] [facility], [name] 03/31/2023 9:30 A M Medical Record Number: [id number] Patient Account Number: [id number] Date of Birth/Sex: Treating RN: 31-Dec-1965 (56 y.o. [name] Primary Care Provider: Aram Beecham Other Clinician: Referring Provider: Treating Provider/Extender: Allen Derry Self,  Referral Weeks in Treatment: 0 Active Problems ICD-10 DENZIL, PALKO ([id number]) [id number] Page 7 of 13 Encounter Code Description Active Date MDM Diagnosis L89.314 Pressure ulcer of right buttock, stage 4 03/31/2023 No Yes Q76.0 Spina bifida occulta 03/31/2023 No Yes Z93.3 Colostomy status 03/31/2023 No Yes I10 Essential (primary) hypertension 03/31/2023 No Yes I25.10 Atherosclerotic heart disease of native coronary artery without angina pectoris 03/31/2023 No Yes Inactive Problems Resolved Problems Electronic Signature(s) Signed: 03/31/2023 9:45:50 AM By: Allen Derry PA-C Entered By: Allen Derry on 03/31/2023 09:45:49 -------------------------------------------------------------------------------- Progress Note Details Patient Name: Date of Service: FA Lacy Duverney TH, [name] 03/31/2023 9:30 A M Medical Record Number: [id number] Patient Account Number: [id number] Date of Birth/Sex: Treating RN: 29-Oct-1965 (56 y.o. [name]) [name] Primary Care Provider: Aram Beecham Other Clinician: Referring Provider: Treating Provider/Extender: Allen Derry Self, Referral Weeks in Treatment: 0 Subjective Chief Complaint Information obtained from Patient Right ischial tuberosity pressure ulcer History of Present Illness (HPI) 57 year old male with a history of spina bifida presenting to [place] with a history of a open wound on the left gluteal region near his upper thigh which she's had for several weeks. He was seen in the ED recently at [facility] and was put on doxycycline and was advised some local care. his past medical history is significant for spinal bifid, neurogenic bladder, kidney stones, sacral pressure sores, constipation. Past surgical history significant for partial cystectomy, ileal conduit, VP shunt removal, percutaneous nephro lithotripsy. In the remote past the patient says he's had some treatment for a ulcer on this area and was treated with a  skin graft. Readmission: 10/16/2021 upon evaluation today patient appears to be doing somewhat poorly in regard to a fairly large wound noted over the right ischial tuberosity location. This is a stage IV pressure ulcer. Of note this is also positive for osteomyelitis upon a CT scan which was performed during the time that he was admitted in the hospital on 09/19/2021. With that being said it is noted in the right inferior buttock that he has a decubitus ulcer which extends to the posterior  toe inferior right ischium where there is evidence of osteomyelitis. When he was here in the office actually missed that this was positive I missed read it and thought it said that there was no osteomyelitis. That is not the case there is in fact osteomyelitis noted here. I actually did review his notes as well in epic. Looking to the discharge summary it appears that the patient was admitted from 09/19/2021 through 09/23/2021. He was discharged home with home health care according to the note. With that being said from what I understand his sister actually takes care of him at this point. He was also to follow-up with a primary care provider and here at the wound care center within a week. I am just now seeing him on the 13th almost a Chase [name], Chase [name] ([id number]) [id number] Page 8 of 13 month after discharge. He does have a history of again osteomyelitis of this right hip/ischial tuberosity location. 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 The reason for his admission was actually worsening of the decubitus ulcer upon admission. He does have chronic osteomyelitis of this area. He was treated with empiric antibiotics he had acute kidney injury which improved with IV fluids he also had hypokalemia. Surgical debridement was performed by  general surgery at that time and ID was consulted to assist with management according to the notes. IV antibiotics were recommended but patient declined and wanted oral antibiotics at discharge. Therefore no IV antibiotics were planned for discharge time. This case was under the care of Dr. Joylene Draft while he was in the hospital when she did discharge him with a 2-week course of doxycycline and Augmentin according to the notes. It looks like Dr. Judithann Sheen office did call and leave a message for the patient to get scheduled for a follow-up visit after hospital discharge I do not see where he showed up in fact it appears that the patient has a no-show letter from Dr. Judithann Sheen office noted in epic due to a appointment that was missed on 10/06/2021. Looking at the pictures from Dr. Cristine Polio notes on 09/23/2021 it does appear that the wound is a little bit better as far as the cleanliness of the surface of the wound today but nonetheless still is a very significant wound. 10/26/2021 upon evaluation today patient appears to be doing okay in regard to his wound. Again this is a wound that he did indeed have osteomyelitis and previous. With that being said I do not see any signs of a worsening infection at this time which is good news. Overall I think that we are headed in the right direction although still I think he needs more aggressive offloading. 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 I do believe this would be ideal for him to be honest. Readmission: 01-15-2022 upon evaluation today patient presents for reevaluation he was last seen on October 26, 2021. At that time unfortunately he was having issues with a significant ischial pressure ulcer on the right ischial tuberosity. Subsequently he ended up having decent response to the Dakin's moistened gauze dressing that was previously being utilized and subsequently the patient's sister who is his primary caregiver as well states that  she decided just to try to manage this at home. Nonetheless unfortunately this is getting a little larger not better. He  does spend a lot of the day in his chair. He does have an air mattress according what they tell me today. That was previously ordered. With that being said I do believe that at this time things do appear to be doing much better from the standpoint of infection I do not see any signs of overt infection. There is also no evidence of systemic infection. No fevers, chills, nausea, vomiting, or diarrhea. With that being said I do believe based on what I am seeing the wound is a bit cleaner and he possibly could be an excellent candidate for a wound VAC. 02-05-2022 upon evaluation today patient appears to be doing about the same in regard to his wound. Unfortunately he is not any better but he also really has not had the wound VAC on sufficiently. There is been some confusion with the orders part of that I think is our follow-up with the way the order was written part of it may be with how the wound VAC is being placed either way we need to see what we can do to try to improve things in this regard. We will clarify the orders today going forward for home health. Unfortunately the patient does have a new wound today which is on the opposite side. This is not good 1 was bad enough have another area to open is definitely not a good thing. Its not as deep but nonetheless is also not very shallow either. We may potentially be able to get this to the point that we could bridge the 2 together in order to wound VAC both but right now want a focus on to try to get the wound VAC going for the main wound initially we will likely use Hydrofera Blue on the new wound.Marland Kitchen 02-19-2022 upon evaluation today patient's wound appears to be doing a little bit worse in the way of maceration fortunately there is no signs of active infection locally or systemically which is great news. No fevers, chills, nausea,  vomiting, or diarrhea. 03-05-2022 upon evaluation today patient unfortunately does not appear to be doing nearly as good in regard to his wounds as I would like to see. Fortunately I do not see any evidence of active infection that is obvious although I am can obtain a wound culture to ensure that there is not anything getting worse here as far as the wound without the wound VAC on his concern. With that being said I will go ahead and get this sent in and evaluated will initiate treatment as needed going forward if necessary. With regard to the wound with the wound VAC this continues to be placed directly over the wound and there is obvious signs of deep tissue injury. The suction being here means that he is sitting on it I think this may be part of the reason why it is coming off although not 100% certain. 03-12-2022 upon evaluation today patient still has not gotten the antibiotics that were called in for him 4 days ago. He tells me that his sister just told him this morning that they were ready but that she has not had a chance to go pick them up. Nonetheless I feel like that this is really something he needs any should have been taken that not the other antibiotics which were not doing the job for him to be honest. The patient states and I completely agree that there is really no way he would have known relying on his sister to let him  know obviously. Nonetheless I do believe that he needs to not be taking any other antibiotics and be on the Levaquin as soon as possible. He tells me that she said she was going to pick them up today. In regard to home health I am hopeful that they actually have been bridging this now. I do think that the wound looks better and this is good news. With that being said I am not certain that I can really tell for sure how this was attached it was removed before he came in and the black foam left in place. I really want him to leave the wound VAC in place until he comes in so  that we can monitor and make sure that this is being done correctly he voiced understanding. 03-19-2022 upon evaluation today and much happier with how things appear as far as the patient's wounds are concerned. I think he is on a much better track and they are doing a better job at bridging although its not quite where I like to see it is still more posterior which I think the patient needs to be a little bit more lateral on his side. Either way this is something that I did marked with a small dot today for him to tell the home health nurses well so she can bridge it to that location also think the tubing should go up instead of going down to just make it less cumbersome overall for him. 04-19-2022 upon evaluation today patient appears to be doing a little better in regard to his wounds. Fortunately I do not see any signs of infection I do believe he is on the right track. The wound VAC does seem to be helping to clean the wound up a bit and bring it in from the base at work. This is good news. 05-10-2022 upon evaluation today patient appears to be doing well currently in regard to his wounds in general in fact of the smaller of the 2 wounds without the wound VAC actually appears to be doing so much better this is almost completely closed and very pleased in that regard. He has been staying off of this he tells me and shows. With that being said the wound with the wound VAC is going require some sharp debridement there is some tissue which is not quite as healthy and we are going to go ahead and continue that for probably 2 more weeks although I think we may be closer to discontinuing this as well since it has healed and quite significantly. 05-24-2022 upon evaluation today patient appears to be doing well with regard to his wound in fact this looks better than I have been expected. This actually I think is a good time to go ahead and switch away from the wound VAC based on what I am seeing. I think we go  to [facility] with a bordered foam dressing. 06-21-2022 upon evaluation today patient appears to be doing well currently in regard to his wound this is measuring smaller and looks well. Fortunately there does not appear to be any signs of active infection locally or systemically at this time which is great news. No fevers, chills, nausea, vomiting, or diarrhea. 07-29-2022 upon evaluation today patient appears to be doing well currently in regard to his wound. This is actually measuring smaller although there is a lot of hypergranulation noted. Fortunately there does not appear to be any signs of active infection locally or systemically at this time. No fevers,  chills, nausea, vomiting, or diarrhea. 11/9; pressure ulcer on the right buttock in the setting of spina bifida and lower extremity paralysis. He has been using [facility] 09-02-2022 upon evaluation today patient appears to be doing well currently in regard to his wound. We have been using Hydrofera Blue this is showing signs of improvement as far as getting smaller is concerned. There is some drainage around the edges of the wound but I think a lot of this is due to the fact he did not even have a dressing on that he came in today. 12/29; right buttock pressure area which was initially stage IV. This is in the setting of a patient with spina bifida spends most of his time in a wheelchair. I am not certain how much he is offloading this. He has been using for [facility] as a primary dressing. When he was here the last visit he was felt to have hyper granulated tissue and was treated with silver nitrate chemical cauterization 10-14-2022 upon evaluation today patient appears to be doing poorly in regard to the wound in the right ischial location. Unfortunately he tells me that the [place] ([id number]) [id number] Page 9 of 13 dressing seems to be falling off frequently. Unfortunately I think that  this has contributed to him developing an infection which appears to be exemplified by the fact that he has bone exposed which is actually necrotic and working its way out of the wound up and have to perform a fairly significant debridement today it does appear. I think that he needs to have these dressings ensure that they are in place at all times. Also discussed with the patient that we will get a need to do some testing in order to see if he indeed has an infection. If he does have a bone infection which I think is pretty likely to be osteomyelitis of this ischial location. I discussed that with the patient in detail today and depending on what we see him and make any adjustments in care as far as going forward is concerned. Obviously depressed antibiotic that we gave him that he tells me was somewhat irritating as far as the stomach side effects are concerned. He states he really would not want to be on that again. Either way I feel he is probably going to be placed on something once we get the results of the culture back. I am honestly concerned about the fact that he comes into the clinic without any dressings in place I am not sure how often they are really staying where they need to be as far as this wound is concerned. I actually cannot remember seeing him anytime in the past 2 months or so at least that he came in with a dressing intact. 10-21-2022 upon evaluation today patient presents for initial inspection here in our clinic concerning issues that he has been having with a wound in the sacral area. Again last time he was here I did debride away necrotic bone which was confirmed on pathology although it was not necessarily confirmed that this was secondary to osteomyelitis on the pathology report. Also the x-ray did not neither confirm nor deny the presence of osteomyelitis and his culture unfortunately showed multiple organisms with nothing predominance of this does not help to rule and  tailor down exactly what is going on here. Fortunately I do not see any signs of active infection systemically which is great news. With that being said locally I am definitely seeing  some issues here. In fact this is warm to touch around the sacral area and I think that cellulitis is definitely present I just cannot pinpoint the exact organism. With that being said I did discuss with the patient as well today that I do believe that he probably needs an MRI in order to further quantify the extent of her likely osteomyelitis and this again is something that we are going to order this will need to be without contrast due to the fact that he is allergic to contrast dye. 11-18-2022 upon evaluation today patient's wound is actually showing some signs of improvement currently. We have been doing the Dakin's moistened gauze dressing which I think is helping to clean up the wound for seeing some granulation growing and still the tissue is not quite as healthy as I would love to see but better than previous. He does have osteomyelitis but the good news is he seems to be treated well with the antibiotics he does have a refill that was just picked up and I think that he needs to continue to take that for certain. 3/7; patient is on a 6-week clinic hiatus. He lives at home with his sister however she is not at home all the time. He transfers out of bed and a sliding board. He claims he is only in the in the wheelchair about 4 5 hours. He does have underlying osteomyelitis I am not sure about the status of the antibiotics here. Part of the wound still easily probes to bone 12-23-2022 upon evaluation today patient appears to be doing well currently in regard to his wound all things considered there is still a lot of hypergranulation but I do feel like that with the Vashe moistened gauze is actually doing better than he was previously with the Hydrofera Blue. This just did not seem to be staying in place and with the  Vashe through able to change this more frequently. Readmission: 03-31-2023 this is a gentleman whom I have seen multiple times intermittently throughout the years compliance has been a real issue however. I do feel like there is some significant issues here still with the fact that dressing changes and keeping dressings in place are still pretty much as a significant problem here for [place]. I do not see any signs of active infection at this time which is great news but at the same time the wounds do not worse in fact he has another wound different than what I even noted previous. Subsequently he I do believe that the patient should continue to do monitor for any evidence of infection or worsening. Overall I think he needs to have regular follow-up visits with [place] and I think that he needs to have dressings in place at all times. He tells me that he is at home primarily by himself transferring himself from the first thing in the morning through at least 3 in the afternoon as his sister works he really needs somebody gets a caregiver with him throughout the day. Patient History Information obtained from Patient. Allergies Sulfa (Sulfonamide Antibiotics), latex, morphine, Iodinated Contrast Media (Severity: Severe) Family History Cancer - Paternal Grandparents, Diabetes - Father,Mother, Hypertension - Father,Mother, Lung Disease - Mother, Stroke - Mother, No family history of Heart Disease, Kidney Disease, Seizures, Thyroid Problems, Tuberculosis. Social History Never smoker, Marital Status - Single, Alcohol Use - Never, Drug Use - No History. Medical History Eyes Patient has history of Cataracts, Glaucoma Denies history of Optic Neuritis Ear/Nose/Mouth/Throat Denies history of  Chronic sinus problems/congestion, Middle ear problems Hematologic/Lymphatic Denies history of Anemia, Hemophilia, Human Immunodeficiency Virus, Lymphedema, Sickle Cell Disease Respiratory Denies history of Aspiration,  Asthma, Chronic Obstructive Pulmonary Disease (COPD), Pneumothorax, Sleep Apnea, Tuberculosis Cardiovascular Patient has history of Coronary Artery Disease, Hypertension Denies history of Angina, Arrhythmia, Congestive Heart Failure, Deep Vein Thrombosis, Hypotension, Myocardial Infarction, Peripheral Arterial Disease, Peripheral Venous Disease, Phlebitis, Vasculitis Endocrine Denies history of Type I Diabetes Immunological Denies history of Lupus Erythematosus, Raynauds, Scleroderma Integumentary (Skin) Patient has history of History of pressure wounds - hx Musculoskeletal Patient has history of Osteoarthritis Neurologic Patient has history of Paraplegia - spina bifida Denies history of Seizure Disorder Oncologic Denies history of Received Chemotherapy, Received Radiation Psychiatric Denies history of Anorexia/bulimia, Confinement Anxiety Medical A Surgical History Notes nd Musculoskeletal spina bifida Neurologic Chase [name], Chase [name] ([id number]) [id number] Page 10 of 13 Spida Bifida Objective Constitutional sitting or standing blood pressure is within target range for patient.. pulse regular and within target range for patient.Marland Kitchen respirations regular, non-labored and within target range for patient.Marland Kitchen temperature within target range for patient.. Well-nourished and well-hydrated in no acute distress. Vitals Time Taken: 9:14 AM, Height: 59 in, Source: Stated, Weight: 90 lbs, Source: Stated, BMI: 18.2, Temperature: 98.2 F, Pulse: 85 bpm, Respiratory Rate: 16 breaths/min, Blood Pressure: 92/62 mmHg. Eyes conjunctiva clear no eyelid edema noted. pupils equal round and reactive to light and accommodation. Ears, Nose, Mouth, and Throat no gross abnormality of ear auricles or external auditory canals. normal hearing noted during conversation. mucus membranes moist. Respiratory normal breathing without difficulty. Musculoskeletal normal gait and posture. no  significant deformity or arthritic changes, no loss or range of motion, no clubbing. Psychiatric this patient is able to make decisions and demonstrates good insight into disease process. Alert and Oriented x 3. pleasant and cooperative. General Notes: Upon inspection patient's wound bed appears really essentially unchanged even since I last saw him in March. He has a new wound that is more superficial but more lateral compared to the primary ulcer that we have been managing here and unfortunately I think that we are continuing to have some pretty significant issues at this point with getting this healed due to again compliance and keeping dressings in place. We have talked to the patient about this and T to the patient sister as well as the sponsor as well. All are in agreement again the patient's 2 appointments regularly transportation should not be alk an issue at this point. I think the biggest issues going to be cared within the home and keeping his dressings in place, to see if he can talk with the Worker about this. Integumentary (Hair, Skin) Wound #5 status is Open. Original cause of wound was Gradually Appeared. The date acquired was: 10/04/2021. The wound is located on the Right,Midline Gluteus. The wound measures 5cm length x 2.5cm width x 1.5cm depth; 9.817cm^2 area and 14.726cm^3 volume. There is Fat Layer (Subcutaneous Tissue) exposed. There is no tunneling or undermining noted. There is a medium amount of serosanguineous drainage noted. There is large (67-100%) red granulation within the wound bed. There is a small (1-33%) amount of necrotic tissue within the wound bed including Adherent Slough. Wound #6 status is Open. Original cause of wound was Gradually Appeared. The date acquired was: 10/04/2021. The wound is located on the Right,Lateral Gluteus. The wound measures 1.5cm length x 1.7cm width x 0.2cm depth; 2.003cm^2 area and 0.401cm^3 volume. There is Fat Layer (Subcutaneous  Tissue) exposed. There is no tunneling or undermining noted.  There is a medium amount of serosanguineous drainage noted. There is large (67-100%) red granulation within the wound bed. There is a small (1-33%) amount of necrotic tissue within the wound bed including Adherent Slough. Assessment Active Problems ICD-10 Pressure ulcer of right buttock, stage 4 Spina bifida occulta Colostomy status Essential (primary) hypertension Atherosclerotic heart disease of native coronary artery without angina pectoris Procedures Wound #5 Pre-procedure diagnosis of Wound #5 is a Pressure Ulcer located on the Right,Midline Gluteus . There was a Chemical/Enzymatic/Mechanical debridement performed by Allen Derry, PA-C. With the following instrument(s): saline gauze. Other agent used was saline gauze. There was no bleeding. The procedure was tolerated well with a pain level of 0 throughout and a pain level of 0 following the procedure. [name] Debridement Measurements: 5cm length x 2.5cm width x 1.5cm depth; 14.726cm^3 volume. [name] debridement Stage noted as Category/Stage IV. Character of Wound/Ulcer [name] Debridement is improved. [name] procedure Diagnosis Wound #5: Same as Pre-Procedure Wound #6 Pre-procedure diagnosis of Wound #6 is a Pressure Ulcer located on the Right,Lateral Gluteus . There was a Chemical/Enzymatic/Mechanical debridement performed by Allen Derry, PA-C. With the following instrument(s): saline gauze. Other agent used was saline gauze. There was no bleeding. The procedure was Chase [name], Chase [name] ([id number]) [id number] Page 11 of 13 tolerated well with a pain level of 0 throughout and a pain level of 0 following the procedure. [name] Debridement Measurements: 1.5cm length x 1.7cm width x 0.2cm depth; 0.401cm^3 volume. [name] debridement Stage noted as Category/Stage III. Character of Wound/Ulcer [name] Debridement is improved. [name] procedure Diagnosis Wound #6: Same as  Pre-Procedure Plan Bathing/ Shower/ Hygiene: Wash wounds with antibacterial soap and water. Anesthetic (Use 'Patient Medications' Section for Anesthetic Order Entry): Lidocaine applied to wound bed WOUND #5: - Gluteus Wound Laterality: Right, Midline Cleanser: [employer] Ancillary Kit - 15 Day Supply (DME) (Generic) 3 x Per Week/30 Days Discharge Instructions: Use supplies as instructed; Kit contains: (15) Saline Bullets; (15) 3x3 Gauze; 15 pr Gloves Prim Dressing: Hydrofera Blue Ready Transfer Foam, 4x5 (in/in) (DME) (Generic) 3 x Per Week/30 Days ary Discharge Instructions: Apply Hydrofera Blue Ready to wound bed as directed Secondary Dressing: (BORDER) Zetuvit Plus SILICONE BORDER Dressing 4x4 (in/in) (DME) (Generic) 3 x Per Week/30 Days Discharge Instructions: Please do not put silicone bordered dressings under wraps. Use non-bordered dressing only. WOUND #6: - Gluteus Wound Laterality: Right, Lateral Cleanser: [employer]ram Ancillary Kit - 15 Day Supply (DME) (Generic) 3 x Per Week/30 Days Discharge Instructions: Use supplies as instructed; Kit contains: (15) Saline Bullets; (15) 3x3 Gauze; 15 pr Gloves Prim Dressing: Hydrofera Blue Ready Transfer Foam, 4x5 (in/in) (DME) (Generic) 3 x Per Week/30 Days ary Discharge Instructions: Apply Hydrofera Blue Ready to wound bed as directed Secondary Dressing: (BORDER) Zetuvit Plus SILICONE BORDER Dressing 4x4 (in/in) (DME) (Generic) 3 x Per Week/30 Days Discharge Instructions: Please do not put silicone bordered dressings under wraps. Use non-bordered dressing only. 1. I would recommend based on what I am seeing that we have the patient go ahead and initiate treatment here with mom a reinstating of the [facility] Blue dressing along with the bordered foam to cover I think this is the best way to go. 2. I am also can recommend that we have the patient continue to utilize appropriate offloading with that being said not having someone that is helping with  care throughout the day I think he is becoming an issue here. He tells me that when he transfers he often will lose his dressings and then there is  nobody to put them back on. Nonetheless this is good to be detrimental to his healing and is not appropriate. I think he needs to have somebody with him they can help take care of him throughout the day as well. 3. And also can recommend the patient should continue to offload is much as possible he should not be sitting all day he should be changing positions in the bed as well. We will see patient back for reevaluation in 1 week here in the clinic. If anything worsens or changes patient will contact our office for additional recommendations. Of note I did reach out to the Worker for [employer], Angelica, and I left a message for her to give me a call back when she got my message about anything we can do to try to get somebody there that And the dressing changes or at least keeping the dressings in place and helping him through the day instead of him being alone from pretty much first thing in the morning through 330 or so in the afternoon with his sister gets off work. Electronic Signature(s) Signed: 04/01/2023 12:31:38 PM By: Allen Derry PA-C Previous Signature: 04/01/2023 12:26:55 PM Version By: Allen Derry PA-C Entered By: Allen Derry on 04/01/2023 12:31:38 -------------------------------------------------------------------------------- ROS/PFSH Details Patient Name: Date of Service: Chase [name] TH, [name] 03/31/2023 9:30 A M Medical Record Number: [id number] Patient Account Number: [id number] Date of Birth/Sex: Treating RN: 1966-10-02 (56 y.o. [name] Primary Care Provider: Aram Beecham Other Clinician: Referring Provider: Treating Provider/Extender: Allen Derry Self, Referral Weeks in Treatment: 0 Information Obtained From Patient Chase [name], Chase [name] ([id number]) [id number] Page 12 of 13 Eyes Medical  History: Positive for: Cataracts; Glaucoma Negative for: Optic Neuritis Ear/Nose/Mouth/Throat Medical History: Negative for: Chronic sinus problems/congestion; Middle ear problems Hematologic/Lymphatic Medical History: Negative for: Anemia; Hemophilia; Human Immunodeficiency Virus; Lymphedema; Sickle Cell Disease Respiratory Medical History: Negative for: Aspiration; Asthma; Chronic Obstructive Pulmonary Disease (COPD); Pneumothorax; Sleep Apnea; Tuberculosis Cardiovascular Medical History: Positive for: Coronary Artery Disease; Hypertension Negative for: Angina; Arrhythmia; Congestive Heart Failure; Deep Vein Thrombosis; Hypotension; Myocardial Infarction; Peripheral Arterial Disease; Peripheral Venous Disease; Phlebitis; Vasculitis Endocrine Medical History: Negative for: Type I Diabetes Immunological Medical History: Negative for: Lupus Erythematosus; Raynauds; Scleroderma Integumentary (Skin) Medical History: Positive for: History of pressure wounds - hx Musculoskeletal Medical History: Positive for: Osteoarthritis Past Medical History Notes: spina bifida Neurologic Medical History: Positive for: Paraplegia - spina bifida Negative for: Seizure Disorder Past Medical History Notes: Spida Bifida Oncologic Medical History: Negative for: Received Chemotherapy; Received Radiation Psychiatric Medical History: Negative for: Anorexia/bulimia; Confinement Anxiety HBO Extended History Items Eyes: Eyes: Cataracts Glaucoma Immunizations Pneumococcal Vaccine: Received Pneumococcal Vaccination: No Implantable Devices None [place], [place] ([id number]) [id number] Page 70 of 47 Family and Social History Cancer: Yes - Paternal Grandparents; Diabetes: Yes - Father,Mother; Heart Disease: No; Hypertension: Yes - Father,Mother; Kidney Disease: No; Lung Disease: Yes - Mother; Seizures: No; Stroke: Yes - Mother; Thyroid Problems: No; Tuberculosis: No;  Never smoker; Marital Status - Single; Alcohol Use: Never; Drug Use: No History; Financial Concerns: No; Food, Clothing or Shelter Needs: No; Support System Lacking: No; Transportation Concerns: No Electronic Signature(s) Signed: 03/31/2023 4:10:11 PM By: [name] RN Signed: 04/01/2023 1:40:48 PM By: Allen Derry PA-C Entered By: [name] on 03/31/2023 09:52:36 -------------------------------------------------------------------------------- SuperBill Details Patient Name: Date of Service: Chase [name] TH, [name] 03/31/2023 Medical Record Number: [id number] Patient Account Number: [id number] Date of Birth/Sex: Treating RN: 1966/04/08 (56 y.o. [name]) [name] Primary Care Provider: Judithann Sheen,  Tinnie Gens Other Clinician: Referring Provider: Treating Provider/Extender: Allen Derry Self, Referral Weeks in Treatment: 0 Diagnosis Coding ICD-10 Codes Code Description L89.314 Pressure ulcer of right buttock, stage 4 Q76.0 Spina bifida occulta Z93.3 Colostomy status I10 Essential (primary) hypertension I25.10 Atherosclerotic heart disease of native coronary artery without angina pectoris Facility Procedures : CPT4 Code: 16109604 Description: 54098 - WOUND CARE VISIT-LEV 5 EST PT Modifier: Quantity: 1 Physician Procedures : CPT4 Code Description Modifier [id number] - WC PHYS LEVEL 4 - EST PT ICD-10 Diagnosis Description L89.314 Pressure ulcer of right buttock, stage 4 Q76.0 Spina bifida occulta Z93.3 Colostomy status I10 Essential (primary) hypertension Quantity: 1 Electronic Signature(s) Signed: 03/31/2023 6:29:14 PM By: Allen Derry PA-C Previous Signature: 03/31/2023 10:38:40 AM Version By: [name] RN Entered By: Allen Derry on 03/31/2023 18:29:13

## 2023-04-12 ENCOUNTER — Encounter: Payer: 59 | Attending: Physician Assistant | Admitting: Physician Assistant

## 2023-04-12 DIAGNOSIS — L89314 Pressure ulcer of right buttock, stage 4: Secondary | ICD-10-CM | POA: Diagnosis present

## 2023-04-12 DIAGNOSIS — N319 Neuromuscular dysfunction of bladder, unspecified: Secondary | ICD-10-CM | POA: Insufficient documentation

## 2023-04-12 DIAGNOSIS — N183 Chronic kidney disease, stage 3 unspecified: Secondary | ICD-10-CM | POA: Insufficient documentation

## 2023-04-12 DIAGNOSIS — I2510 Atherosclerotic heart disease of native coronary artery without angina pectoris: Secondary | ICD-10-CM | POA: Insufficient documentation

## 2023-04-12 DIAGNOSIS — Z933 Colostomy status: Secondary | ICD-10-CM | POA: Diagnosis not present

## 2023-04-12 DIAGNOSIS — Q760 Spina bifida occulta: Secondary | ICD-10-CM | POA: Insufficient documentation

## 2023-04-12 DIAGNOSIS — I129 Hypertensive chronic kidney disease with stage 1 through stage 4 chronic kidney disease, or unspecified chronic kidney disease: Secondary | ICD-10-CM | POA: Diagnosis not present

## 2023-04-12 NOTE — Progress Notes (Signed)
[name] ([id number]) [id number] Page 1 of 9 Visit Report for 04/12/2023 Arrival Information Details Patient Name: Date of Service: [name] [facility], DO [place] LD 04/12/2023 2:30 PM Medical Record Number: [id number] Patient Account Number: [id number] Date of Birth/Sex: Treating RN: [birth year] (57 y.o. [name]) [name] Primary Care [name]: [name] Other Clinician: Referring Malayah [name]: Treating [name]/Extender: [name] in Treatment: 1 Visit Information History Since Last Visit Added or deleted any medications: No Patient Arrived: Wheel Chair Any new allergies or adverse reactions: No Arrival Time: 14:30 Had a fall or experienced change in No Accompanied By: self activities of daily living that may affect Transfer Assistance: None risk of falls: Patient Identification Verified: Yes Signs or symptoms of abuse/neglect since last visito No Secondary Verification Process Completed: Yes Hospitalized since last visit: No Patient Requires Transmission-Based Precautions: No Implantable device outside of the clinic excluding No Patient Has Alerts: No cellular tissue based products placed in the center since last visit: Has Dressing in Place as Prescribed: Yes Pain Present Now: No Electronic Signature(s) Signed: 04/12/2023 2:46:25 PM By: Chase Pax RN Entered By: [name] on 04/12/2023 14:46:25 -------------------------------------------------------------------------------- Clinic Level of Care Assessment Details Patient Name: Date of ServiceEulas [name] [facility], [name] 04/12/2023 2:30 PM Medical Record Number: [id number] Patient Account Number: [id number] Date of Birth/Sex: Treating RN: August 04, 1966 (56 y.o. [name]) [name] Primary Care Tymesha Ditmore: [name] Other Clinician: Referring [name]: Treating [name]/Extender: [name] Weeks in Treatment: 1 Clinic Level of Care Assessment Items TOOL 1 Quantity  Score []  - 0 Use when EandM and Procedure is performed on INITIAL visit ASSESSMENTS - Nursing Assessment / Reassessment []  - 0 General Physical Exam (combine w/ comprehensive assessment (listed just below) when performed on new pt. evals) []  - 0 Comprehensive Assessment (HX, ROS, Risk Assessments, Wounds Hx, etc.) [name] ([id number]) [id number] Page 2 of 9 ASSESSMENTS - Wound and Skin Assessment / Reassessment []  - 0 Dermatologic / Skin Assessment (not related to wound area) ASSESSMENTS - Ostomy and/or Continence Assessment and Care []  - 0 Incontinence Assessment and Management []  - 0 Ostomy Care Assessment and Management (repouching, etc.) PROCESS - Coordination of Care []  - 0 Simple Patient / Family Education for ongoing care []  - 0 Complex (extensive) Patient / Family Education for ongoing care []  - 0 Staff obtains Chiropractor, Records, T Results / Process Orders est []  - 0 Staff telephones HHA, Nursing Homes / Clarify orders / etc []  - 0 Routine Transfer to another Facility (non-emergent condition) []  - 0 Routine Hospital Admission (non-emergent condition) []  - 0 New Admissions / Manufacturing engineer / Ordering NPWT Apligraf, etc. , []  - 0 Emergency Hospital Admission (emergent condition) PROCESS - Special Needs []  - 0 Pediatric / Minor Patient Management []  - 0 Isolation Patient Management []  - 0 Hearing / Language / Visual special needs []  - 0 Assessment of Community assistance (transportation, D/C planning, etc.) []  - 0 Additional assistance / Altered mentation []  - 0 Support Surface(s) Assessment (bed, cushion, seat, etc.) INTERVENTIONS - Miscellaneous []  - 0 External ear exam []  - 0 Patient Transfer (multiple staff / Nurse, adult / Similar devices) []  - 0 Simple Staple / Suture removal (25 or less) []  - 0 Complex Staple / Suture removal (26 or more) []  - 0 Hypo/Hyperglycemic Management (do not check if billed  separately) []  - 0 Ankle / Brachial Index (ABI) - do not check if billed separately Has the patient been seen at the hospital within  the last three years: Yes Total Score: 0 Level Of Care: ____ Electronic Signature(s) Unsigned Entered By: [name] on 04/12/2023 15:35:13 -------------------------------------------------------------------------------- Encounter Discharge Information Details Patient Name: Date of ServiceEulas [name] [facility], [name] 04/12/2023 2:30 PM Medical Record Number: [id number] Patient Account Number: [id number] Date of Birth/Sex: Treating RN: 02/24/1966 (56 y.o. [name] Primary Care [name]: [name] Other Clinician: Fatima [name] ([id number]) [id number] Page 3 of 9 Referring [name]: Treating [name]/Extender: [name] in Treatment: 1 Encounter Discharge Information Items [name] Procedure Vitals Discharge Condition: Stable Temperature (F): 98.2 Ambulatory Status: Wheelchair Pulse (bpm): 96 Discharge Destination: Home Respiratory Rate (breaths/min): 16 Transportation: Private Auto Blood Pressure (mmHg): 98/67 Accompanied By: sister Schedule Follow-up Appointment: No Clinical Summary of Care: Electronic Signature(s) Unsigned Entered ByYevonne [name] on 04/12/2023 15:36:08 -------------------------------------------------------------------------------- Lower Extremity Assessment Details Patient Name: Date of ServiceEulas [name] [facility], [name] 04/12/2023 2:30 PM Medical Record Number: [id number] Patient Account Number: [id number] Date of Birth/Sex: Treating RN: Aug 15, 1966 (56 y.o. [name]) [name] Primary Care [name]: [name] Other Clinician: Referring [name]: Treating [name]/Extender: [name] Weeks in Treatment: 1 Electronic Signature(s) Signed: 04/12/2023 2:51:06 PM By: Chase Pax RN Entered By: [name] on 04/12/2023  14:51:06 -------------------------------------------------------------------------------- Multi Wound Chart Details Patient Name: Date of Service: [name] [facility], [name] 04/12/2023 2:30 PM Medical Record Number: [id number] Patient Account Number: [id number] Date of Birth/Sex: Treating RN: 09-Aug-1966 (56 y.o. [name] Primary Care Bingham Millette: [name] Other Clinician: Referring Granville Whitefield: Treating Keondria Siever/Extender: [name] Weeks in Treatment: 1 Vital Signs Height(in): 59 Pulse(bpm): 96 Weight(lbs): 90 Blood Pressure(mmHg): 98/67 Body Mass Index(BMI): 18.2 Temperature(F): 98.2 Respiratory Rate(breaths/min): 16 [place], [name] ([id number]) [id number] Page 4 of 9 [5:Photos:] [N/A:N/A] Right, Midline Gluteus Right, Lateral Gluteus N/A Wound Location: Gradually Appeared Gradually Appeared N/A Wounding Event: Pressure Ulcer Pressure Ulcer N/A Primary Etiology: Cataracts, Glaucoma, Coronary Artery Cataracts, Glaucoma, Coronary Artery N/A Comorbid History: Disease, Hypertension, History of Disease, Hypertension, History of pressure wounds, Osteoarthritis, pressure wounds, Osteoarthritis, Paraplegia Paraplegia 10/04/2021 10/04/2021 N/A Date Acquired: 1 1 N/A Weeks of Treatment: Open Open N/A Wound Status: No No N/A Wound Recurrence: 5x2.5x1.5 2.6x1.5x0.3 N/A Measurements L x W x D (cm) 9.817 3.063 N/A A (cm) : rea 14.726 0.919 N/A Volume (cm) : 0.00% -52.90% N/A % Reduction in A rea: 0.00% -129.20% N/A % Reduction in Volume: Category/Stage IV Category/Stage III N/A Classification: Medium Medium N/A Exudate A mount: Serosanguineous Serosanguineous N/A Exudate Type: red, brown red, brown N/A Exudate Color: Large (67-100%) Small (1-33%) N/A Granulation A mount: Red Red N/A Granulation Quality: Small (1-33%) Large (67-100%) N/A Necrotic A mount: Fat Layer (Subcutaneous Tissue): Yes Fat Layer (Subcutaneous  Tissue): Yes N/A Exposed Structures: Fascia: No Fascia: No Tendon: No Tendon: No Muscle: No Muscle: No Joint: No Joint: No Bone: No Bone: No N/A None N/A Epithelialization: Treatment Notes Electronic Signature(s) Signed: 04/12/2023 2:51:11 PM By: Chase Pax RN Entered By: [name] on 04/12/2023 14:51:11 -------------------------------------------------------------------------------- Multi-Disciplinary Care Plan Details Patient Name: Date of Service: [name] [facility], [name] 04/12/2023 2:30 PM Medical Record Number: [id number] Patient Account Number: [id number] Date of Birth/Sex: Treating RN: [birth year] (56 y.o. [name] Primary Care Byrd Terrero: [name] Other Clinician: Referring Natayla Cadenhead: Treating Latiana Tomei/Extender: [name] in Treatment: 1 Active Inactive Pressure [name] ([id number]) [id number] Page 5 of 9 Nursing Diagnoses: Potential for impaired tissue integrity related to pressure, friction, moisture, and shear Goals: Patient will remain  free from development of additional pressure ulcers Date Initiated: 03/31/2023 Target Resolution Date: 04/30/2023 Goal Status: Active Interventions: Assess: immobility, friction, shearing, incontinence upon admission and as needed Assess offloading mechanisms upon admission and as needed Assess potential for pressure ulcer upon admission and as needed Notes: Wound/Skin Impairment Nursing Diagnoses: Knowledge deficit related to ulceration/compromised skin integrity Goals: Patient/caregiver will verbalize understanding of skin care regimen Date Initiated: 03/31/2023 Target Resolution Date: 04/30/2023 Goal Status: Active Ulcer/skin breakdown will have a volume reduction of 30% by week 4 Date Initiated: 03/31/2023 Target Resolution Date: 05/31/2023 Goal Status: Active Ulcer/skin breakdown will have a volume reduction of 50% by week 8 Date Initiated:  03/31/2023 Target Resolution Date: 07/01/2023 Goal Status: Active Ulcer/skin breakdown will have a volume reduction of 80% by week 12 Date Initiated: 03/31/2023 Target Resolution Date: 07/31/2023 Goal Status: Active Ulcer/skin breakdown will heal within 14 weeks Date Initiated: 03/31/2023 Target Resolution Date: 08/31/2023 Goal Status: Active Interventions: Assess patient/caregiver ability to obtain necessary supplies Assess patient/caregiver ability to perform ulcer/skin care regimen upon admission and as needed Assess ulceration(s) every visit Notes: Electronic Signature(s) Signed: 04/12/2023 2:52:20 PM By: Chase Pax RN Entered By: [name] on 04/12/2023 14:52:19 -------------------------------------------------------------------------------- Pain Assessment Details Patient Name: Date of ServiceEulas [name] [place], [name] 04/12/2023 2:30 PM Medical Record Number: [id number] Patient Account Number: [id number] Date of Birth/Sex: Treating RN: 11/02/1965 (56 y.o. [name] Primary Care Darryel Diodato: [name] Other Clinician: Referring Shandiin Eisenbeis: Treating Hero Kulish/Extender: [name] Weeks in Treatment: 1 Active Problems Location of Pain Severity and Description of Pain Patient Has Paino No ABSHIR, PAOLINI ([id number]) [id number] Page 6 of 9 Patient Has Paino No Site Locations Pain Management and Medication Current Pain Management: Electronic Signature(s) Signed: 04/12/2023 2:48:07 PM By: Chase Pax RN Entered By: [name] on 04/12/2023 14:48:06 -------------------------------------------------------------------------------- Patient/Caregiver Education Details Patient Name: Date of Service: [name] TH, [name] 7/9/2024andnbsp2:30 PM Medical Record Number: [id number] Patient Account Number: [id number] Date of Birth/Gender: Treating RN: 01/14/1966 (56 y.o. [name] Primary Care Physician: [name] Other  Clinician: Referring Physician: Treating Physician/Extender: [name] in Treatment: 1 Education Assessment Education Provided To: Patient Education Topics Provided Wound/Skin Impairment: Handouts: Caring for Your Ulcer Spanish Methods: Explain/Verbal Responses: State content correctly Electronic Signature(s) Unsigned Entered ByYevonne [name] on 04/12/2023 14:52:44 Signature(s): [name] ([id number]) 1 Date(s): [phone].pdf Page 7 of 9 -------------------------------------------------------------------------------- Wound Assessment Details Patient Name: Date of Service: [name] [facility], [name] 04/12/2023 2:30 PM Medical Record Number: [id number] Patient Account Number: [id number] Date of Birth/Sex: Treating RN: 15-Dec-1965 (56 y.o. [name]) [name] Primary Care Kambria Grima: [name] Other Clinician: Referring Sheri Prows: Treating Digna Countess/Extender: [name] Weeks in Treatment: 1 Wound Status Wound Number: 5 Primary Pressure Ulcer Etiology: Wound Location: Right, Midline Gluteus Wound Open Wounding Event: Gradually Appeared Status: Date Acquired: 10/04/2021 Comorbid Cataracts, Glaucoma, Coronary Artery Disease, Hypertension, Weeks Of Treatment: 1 History: History of pressure wounds, Osteoarthritis, Paraplegia Clustered Wound: No Photos Wound Measurements Length: (cm) 5 Width: (cm) 2.5 Depth: (cm) 1.5 Area: (cm) 9.817 Volume: (cm) 14.726 % Reduction in Area: 0% % Reduction in Volume: 0% Tunneling: No Undermining: No Wound Description Classification: Category/Stage IV Exudate Amount: Medium Exudate Type: Serosanguineous Exudate Color: red, brown Foul Odor After Cleansing: No Slough/Fibrino Yes Wound Bed Granulation Amount: Large (67-100%) Exposed Structure Granulation Quality: Red Fascia Exposed: No Necrotic Amount: Small (1-33%) Fat Layer (Subcutaneous Tissue) Exposed: Yes Necrotic  Quality: Adherent Slough Tendon Exposed: No Muscle  Exposed: No Joint Exposed: No Bone Exposed: No Treatment Notes Wound #5 (Gluteus) Wound Laterality: Right, Midline Cleanser Byram Ancillary Kit - 15 Day Supply Discharge Instruction: Use supplies as instructed; Kit contains: (15) Saline Bullets; (15) 3x3 Gauze; 15 pr Gloves Peri-Wound Care [place], [name] ([id number]) [id number] Page 8 of 9 Topical Primary Dressing Hydrofera Blue Ready Transfer Foam, 4x5 (in/in) Discharge Instruction: Apply Hydrofera Blue Ready to wound bed as directed Secondary Dressing (BORDER) Zetuvit Plus SILICONE BORDER Dressing 4x4 (in/in) Discharge Instruction: Please do not put silicone bordered dressings under wraps. Use non-bordered dressing only. Secured With Compression Wrap Compression Stockings Facilities manager) Signed: 04/12/2023 2:49:37 PM By: Chase Pax RN Entered By: [name] on 04/12/2023 14:49:37 -------------------------------------------------------------------------------- Wound Assessment Details Patient Name: Date of Service: [name] [facility], [name] 04/12/2023 2:30 PM Medical Record Number: [id number] Patient Account Number: [id number] Date of Birth/Sex: Treating RN: 1966/05/17 (56 y.o. [name]) [name] Primary Care Slayde Brault: [name] Other Clinician: Referring Jakobie Henslee: Treating Geral Coker/Extender: [name] Weeks in Treatment: 1 Wound Status Wound Number: 6 Primary Pressure Ulcer Etiology: Wound Location: Right, Lateral Gluteus Wound Open Wounding Event: Gradually Appeared Status: Date Acquired: 10/04/2021 Comorbid Cataracts, Glaucoma, Coronary Artery Disease, Hypertension, Weeks Of Treatment: 1 History: History of pressure wounds, Osteoarthritis, Paraplegia Clustered Wound: No Photos Wound Measurements Length: (cm) 2.6 Width: (cm) 1.5 Depth: (cm) 0.3 Area: (cm) 3.063 Volume: (cm) 0.919 % Reduction in  Area: -52.9% % Reduction in Volume: -129.2% Epithelialization: None Tunneling: No Undermining: No Wound Description TORRANCE, STOCKLEY ([id number]) Classification: Category/Stage III Exudate Amount: Medium Exudate Type: Serosanguineous Exudate Color: red, brown [id number] Page 9 of 9 Foul Odor After Cleansing: No Slough/Fibrino Yes Wound Bed Granulation Amount: Small (1-33%) Exposed Structure Granulation Quality: Red Fascia Exposed: No Necrotic Amount: Large (67-100%) Fat Layer (Subcutaneous Tissue) Exposed: Yes Necrotic Quality: Adherent Slough Tendon Exposed: No Muscle Exposed: No Joint Exposed: No Bone Exposed: No Electronic Signature(s) Signed: 04/12/2023 2:49:58 PM By: Chase Pax RN Entered By: [name] on 04/12/2023 14:49:58 -------------------------------------------------------------------------------- Vitals Details Patient Name: Date of Service: [name] [facility], [name] 04/12/2023 2:30 PM Medical Record Number: [id number] Patient Account Number: [id number] Date of Birth/Sex: Treating RN: 1966/07/21 (56 y.o. [name]) [name] Primary Care Celene Pippins: [name] Other Clinician: Referring Ruthene Methvin: Treating Marrio Scribner/Extender: [name] in Treatment: 1 Vital Signs Time Taken: 14:46 Temperature (F): 98.2 Height (in): 59 Pulse (bpm): 96 Weight (lbs): 90 Respiratory Rate (breaths/min): 16 Body Mass Index (BMI): 18.2 Blood Pressure (mmHg): 98/67 Reference Range: 80 - 120 mg / dl Electronic Signature(s) Signed: 04/12/2023 2:46:59 PM By: Chase Pax RN Entered By: [name] on 04/12/2023 14:46:59

## 2023-04-12 NOTE — Progress Notes (Signed)
[place], [name] ([id number]) [id number] Page 1 of 3 Visit Report for 04/12/2023 Chief Complaint Document Details Patient Name: Date of Service: [name] [facility], [name] 04/12/2023 2:30 PM Medical Record Number: [id number] Patient Account Number: [id number] Date of Birth/Sex: Treating RN: [birth year] (57 y.o. [name]) [name] Primary Care Provider: Aram Beecham Other Clinician: Referring Provider: Treating Provider/Extender: Gabriel Earing in Treatment: 1 Information Obtained from: Patient Chief Complaint Right ischial tuberosity pressure ulcer Electronic Signature(s) Signed: 04/12/2023 2:50:47 PM By: Allen Derry PA-C Entered By: Allen Derry on 04/12/2023 14:50:47 -------------------------------------------------------------------------------- Debridement Details Patient Name: Date of Service: [name] TH, [name] 04/12/2023 2:30 PM Medical Record Number: [id number] Patient Account Number: [id number] Date of Birth/Sex: Treating RN: [birth year] (57 y.o. [name] Primary Care Provider: Aram Beecham Other Clinician: Referring Provider: Treating Provider/Extender: Hermine Messick Weeks in Treatment: 1 Debridement Performed for Assessment: Wound #6 Right,Lateral Gluteus Performed By: Physician Allen Derry, PA-C Debridement Type: Debridement Level of Consciousness (Pre-procedure): Awake and Alert Pre-procedure Verification/Time Out Yes - 15:15 Taken: Start Time: 15:15 Percent of Wound Bed Debrided: 100% T Area Debrided (cm): otal 3.06 Tissue and other material debrided: Viable, Non-Viable, Slough, Subcutaneous, Slough Level: Skin/Subcutaneous Tissue Debridement Description: Excisional Instrument: Curette Bleeding: Minimum Hemostasis Achieved: Pressure End Time: 15:21 Procedural Pain: 0 [name] Procedural Pain: 0 Response to Treatment: Procedure was tolerated well [name] ([id number])  [id number] Page 2 of 3 Level of Consciousness ([name] Awake and Alert procedure): [name] Debridement Measurements of Total Wound Length: (cm) 2.6 Stage: Category/Stage III Width: (cm) 1.5 Depth: (cm) 0.3 Volume: (cm) 0.919 Character of Wound/Ulcer [name] Debridement: Requires Further Debridement [name] Procedure Diagnosis Same as Pre-procedure Electronic Signature(s) Unsigned Entered ByYevonne Pax on 04/12/2023 15:32:06 -------------------------------------------------------------------------------- Physician Orders Details Patient Name: Date of ServiceEulas [name] [facility], [name] 04/12/2023 2:30 PM Medical Record Number: [id number] Patient Account Number: [id number] Date of Birth/Sex: Treating RN: [birth year] (57 y.o. [name]) [name] Primary Care Provider: Aram Beecham Other Clinician: Referring Provider: Treating Provider/Extender: Gabriel Earing in Treatment: 1 Verbal / Phone Orders: No Diagnosis Coding ICD-10 Coding Code Description L89.314 Pressure ulcer of right buttock, stage 4 Q76.0 Spina bifida occulta Z93.3 Colostomy status I10 Essential (primary) hypertension I25.10 Atherosclerotic heart disease of native coronary artery without angina pectoris Bathing/ Shower/ Hygiene Wash wounds with antibacterial soap and water. Anesthetic (Use 'Patient Medications' Section for Anesthetic Order Entry) Lidocaine applied to wound bed Wound Treatment Wound #5 - Gluteus Wound Laterality: Right, Midline Cleanser: Byram Ancillary Kit - 15 Day Supply (Generic) 3 x Per Week/30 Days Discharge Instructions: Use supplies as instructed; Kit contains: (15) Saline Bullets; (15) 3x3 Gauze; 15 pr Gloves Prim Dressing: Hydrofera Blue Ready Transfer Foam, 4x5 (in/in) (Generic) 3 x Per Week/30 Days ary Discharge Instructions: Apply Hydrofera Blue Ready to wound bed as directed Secondary Dressing: (BORDER) Zetuvit Plus SILICONE BORDER Dressing 4x4 (in/in)  (Generic) 3 x Per Week/30 Days Discharge Instructions: Please do not put silicone bordered dressings under wraps. Use non-bordered dressing only. Wound #6 - Gluteus Wound Laterality: Right, Lateral Cleanser: Byram Ancillary Kit - 15 Day Supply (Generic) 3 x Per Week/30 Days Discharge Instructions: Use supplies as instructed; Kit contains: (15) Saline Bullets; (15) 3x3 Gauze; 15 pr Gloves [place], [place] ([id number]) [id number] Page 3 of 3 Prim Dressing: Hydrofera Blue Ready Transfer Foam, 4x5 (in/in) (Generic) 3 x Per Week/30 Days ary Discharge Instructions: Apply Hydrofera Blue Ready to wound bed as directed Secondary Dressing: (BORDER) Zetuvit Plus SILICONE  BORDER Dressing 4x4 (in/in) (Generic) 3 x Per Week/30 Days Discharge Instructions: Please do not put silicone bordered dressings under wraps. Use non-bordered dressing only. Electronic Signature(s) Unsigned Entered By: [name] on 04/12/2023 15:35:05 -------------------------------------------------------------------------------- Problem List Details Patient Name: Date of ServiceEulas [name] [facility], [name] 04/12/2023 2:30 PM Medical Record Number: [id number] Patient Account Number: [id number] Date of Birth/Sex: Treating RN: [birth year] (57 y.o. [name]) [name] Primary Care Provider: Aram Beecham Other Clinician: Referring Provider: Treating Provider/Extender: Hermine Messick Weeks in Treatment: 1 Active Problems ICD-10 Encounter Code Description Active Date MDM Diagnosis L89.314 Pressure ulcer of right buttock, stage 4 03/31/2023 No Yes Q76.0 Spina bifida occulta 03/31/2023 No Yes Z93.3 Colostomy status 03/31/2023 No Yes I10 Essential (primary) hypertension 03/31/2023 No Yes I25.10 Atherosclerotic heart disease of native coronary artery without angina pectoris 03/31/2023 No Yes Inactive Problems Resolved Problems Electronic Signature(s) Signed: 04/12/2023 2:50:41 PM By: Allen Derry  PA-C Entered By: Allen Derry on 04/12/2023 14:50:41

## 2023-04-21 ENCOUNTER — Encounter: Payer: 59 | Admitting: Physician Assistant

## 2023-04-21 DIAGNOSIS — L89314 Pressure ulcer of right buttock, stage 4: Secondary | ICD-10-CM | POA: Diagnosis not present

## 2023-04-21 NOTE — Progress Notes (Signed)
[name], Chase Hill ([id number]) [id number] Page 1 of 10 Visit Report for 04/21/2023 Chief Complaint Document Details Patient Name: Date of Service: [name] [facility], [name] 04/21/2023 1:30 PM Medical Record Number: [id number] Patient Account Number: [id number] Date of Birth/Sex: Treating RN: 1965/11/10 (56 y.o. [name]) [name] Primary Care Provider: Aram Beecham Other Clinician: Referring Provider: Treating Provider/Extender: Gabriel Earing in Treatment: 3 Information Obtained from: Patient Chief Complaint Right ischial tuberosity pressure ulcer Electronic Signature(s) Signed: 04/21/2023 1:33:40 PM By: Allen Derry PA-C Entered By: Allen Derry on 04/21/2023 13:33:40 -------------------------------------------------------------------------------- HPI Details Patient Name: Date of Service: [name] [facility], [name] 04/21/2023 1:30 PM Medical Record Number: [id number] Patient Account Number: [id number] Date of Birth/Sex: Treating RN: October 01, 1966 (56 y.o. [name] Primary Care Provider: Aram Beecham Other Clinician: Referring Provider: Treating Provider/Extender: Gabriel Earing in Treatment: 3 History of Present Illness HPI Description: 57 year old male with a history of spina bifida presenting to [place] with a history of a open wound on the left gluteal region near his upper thigh which she's had for several weeks. He was seen in the ED recently at [facility] and was put on doxycycline and was advised some local care. his past medical history is significant for spinal bifid, neurogenic bladder, kidney stones, sacral pressure sores, constipation. Past surgical history significant for partial cystectomy, ileal conduit, VP shunt removal, percutaneous nephro lithotripsy. In the remote past the patient says he's had some treatment for a ulcer on this area and was treated with a skin graft. Readmission: 10/16/2021 upon  evaluation today patient appears to be doing somewhat poorly in regard to a fairly large wound noted over the right ischial tuberosity location. This is a stage IV pressure ulcer. Of note this is also positive for osteomyelitis upon a CT scan which was performed during the time that he was admitted in the hospital on 09/19/2021. With that being said it is noted in the right inferior buttock that he has a decubitus ulcer which extends to the posterior toe inferior right ischium where there is evidence of osteomyelitis. When he was here in the office actually missed that this was positive I missed read it and thought it said that there was no osteomyelitis. That is not the case there is in fact osteomyelitis noted here. I actually did review his notes as well in epic. Looking to the discharge summary it appears that the patient was admitted from 09/19/2021 through 09/23/2021. He was discharged home with home health care according to the note. With that being said from what I understand his sister actually takes care of him at this point. He was also to follow-up with a primary care provider and here at the wound care center within a week. I am just now seeing him on the 13th almost a JACION, DISMORE ([id number]) [id number] Page 2 of 10 month after discharge. He does have a history of again osteomyelitis of this right hip/ischial tuberosity location. 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 The reason for his admission was actually worsening of the decubitus ulcer upon admission. He does have chronic osteomyelitis of this area. He was treated with empiric antibiotics he had acute kidney injury which improved with IV fluids he also had hypokalemia. Surgical debridement was performed by general surgery at that time  and ID was  consulted to assist with management according to the notes. IV antibiotics were recommended but patient declined and wanted oral antibiotics at discharge. Therefore no IV antibiotics were planned for discharge time. This case was under the care of Dr. Joylene Draft while he was in the hospital when she did discharge him with a 2-week course of doxycycline and Augmentin according to the notes. It looks like Dr. Judithann Sheen office did call and leave a message for the patient to get scheduled for a follow-up visit after hospital discharge I do not see where he showed up in fact it appears that the patient has a no-show letter from Dr. Judithann Sheen office noted in epic due to a appointment that was missed on 10/06/2021. Looking at the pictures from Dr. Cristine Polio notes on 09/23/2021 it does appear that the wound is a little bit better as far as the cleanliness of the surface of the wound today but nonetheless still is a very significant wound. 10/26/2021 upon evaluation today patient appears to be doing okay in regard to his wound. Again this is a wound that he did indeed have osteomyelitis and previous. With that being said I do not see any signs of a worsening infection at this time which is good news. Overall I think that we are headed in the right direction although still I think he needs more aggressive offloading. 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 I do believe this would be ideal for him to be honest. Readmission: 01-15-2022 upon evaluation today patient presents for reevaluation he was last seen on October 26, 2021. At that time unfortunately he was having issues with a significant ischial pressure ulcer on the right ischial tuberosity. Subsequently he ended up having decent response to the Dakin's moistened gauze dressing that was previously being utilized and subsequently the patient's sister who is his primary caregiver as well states that she decided just to try to manage this  at home. Nonetheless unfortunately this is getting a little larger not better. He does spend a lot of the day in his chair. He does have an air mattress according what they tell me today. That was previously ordered. With that being said I do believe that at this time things do appear to be doing much better from the standpoint of infection I do not see any signs of overt infection. There is also no evidence of systemic infection. No fevers, chills, nausea, vomiting, or diarrhea. With that being said I do believe based on what I am seeing the wound is a bit cleaner and he possibly could be an excellent candidate for a wound VAC. 02-05-2022 upon evaluation today patient appears to be doing about the same in regard to his wound. Unfortunately he is not any better but he also really has not had the wound VAC on sufficiently. There is been some confusion with the orders part of that I think is our follow-up with the way the order was written part of it may be with how the wound VAC is being placed either way we need to see what we can do to try to improve things in this regard. We will clarify the orders today going forward for home health. Unfortunately the patient does have a new wound today which is on the opposite side. This is not good 1 was bad enough have another area to open is definitely not a good thing. Its not as deep but nonetheless is also not  very shallow either. We may potentially be able to get this to the point that we could bridge the 2 together in order to wound VAC both but right now want a focus on to try to get the wound VAC going for the main wound initially we will likely use Hydrofera Blue on the new wound.Marland Kitchen 02-19-2022 upon evaluation today patient's wound appears to be doing a little bit worse in the way of maceration fortunately there is no signs of active infection locally or systemically which is great news. No fevers, chills, nausea, vomiting, or diarrhea. 03-05-2022 upon  evaluation today patient unfortunately does not appear to be doing nearly as good in regard to his wounds as I would like to see. Fortunately I do not see any evidence of active infection that is obvious although I am can obtain a wound culture to ensure that there is not anything getting worse here as far as the wound without the wound VAC on his concern. With that being said I will go ahead and get this sent in and evaluated will initiate treatment as needed going forward if necessary. With regard to the wound with the wound VAC this continues to be placed directly over the wound and there is obvious signs of deep tissue injury. The suction being here means that he is sitting on it I think this may be part of the reason why it is coming off although not 100% certain. 03-12-2022 upon evaluation today patient still has not gotten the antibiotics that were called in for him 4 days ago. He tells me that his sister just told him this morning that they were ready but that she has not had a chance to go pick them up. Nonetheless I feel like that this is really something he needs any should have been taken that not the other antibiotics which were not doing the job for him to be honest. The patient states and I completely agree that there is really no way he would have known relying on his sister to let him know obviously. Nonetheless I do believe that he needs to not be taking any other antibiotics and be on the Levaquin as soon as possible. He tells me that she said she was going to pick them up today. In regard to home health I am hopeful that they actually have been bridging this now. I do think that the wound looks better and this is good news. With that being said I am not certain that I can really tell for sure how this was attached it was removed before he came in and the black foam left in place. I really want him to leave the wound VAC in place until he comes in so that we can monitor and make sure  that this is being done correctly he voiced understanding. 03-19-2022 upon evaluation today and much happier with how things appear as far as the patient's wounds are concerned. I think he is on a much better track and they are doing a better job at bridging although its not quite where I like to see it is still more posterior which I think the patient needs to be a little bit more lateral on his side. Either way this is something that I did marked with a small dot today for him to tell the home health nurses well so she can bridge it to that location also think the tubing should go up instead of going down to just make it less  cumbersome overall for him. 04-19-2022 upon evaluation today patient appears to be doing a little better in regard to his wounds. Fortunately I do not see any signs of infection I do believe he is on the right track. The wound VAC does seem to be helping to clean the wound up a bit and bring it in from the base at work. This is good news. 05-10-2022 upon evaluation today patient appears to be doing well currently in regard to his wounds in general in fact of the smaller of the 2 wounds without the wound VAC actually appears to be doing so much better this is almost completely closed and very pleased in that regard. He has been staying off of this he tells me and shows. With that being said the wound with the wound VAC is going require some sharp debridement there is some tissue which is not quite as healthy and we are going to go ahead and continue that for probably 2 more weeks although I think we may be closer to discontinuing this as well since it has healed and quite significantly. 05-24-2022 upon evaluation today patient appears to be doing well with regard to his wound in fact this looks better than I have been expected. This actually I think is a good time to go ahead and switch away from the wound VAC based on what I am seeing. I think we go to [facility] with a bordered  foam dressing. 06-21-2022 upon evaluation today patient appears to be doing well currently in regard to his wound this is measuring smaller and looks well. Fortunately there does not appear to be any signs of active infection locally or systemically at this time which is great news. No fevers, chills, nausea, vomiting, or diarrhea. 07-29-2022 upon evaluation today patient appears to be doing well currently in regard to his wound. This is actually measuring smaller although there is a lot of hypergranulation noted. Fortunately there does not appear to be any signs of active infection locally or systemically at this time. No fevers, chills, nausea, vomiting, or diarrhea. 11/9; pressure ulcer on the right buttock in the setting of spina bifida and lower extremity paralysis. He has been using [facility] 09-02-2022 upon evaluation today patient appears to be doing well currently in regard to his wound. We have been using Hydrofera Blue this is showing signs of improvement as far as getting smaller is concerned. There is some drainage around the edges of the wound but I think a lot of this is due to the fact he did not even have a dressing on that he came in today. 12/29; right buttock pressure area which was initially stage IV. This is in the setting of a patient with spina bifida spends most of his time in a wheelchair. I am not certain how much he is offloading this. He has been using for [facility] as a primary dressing. When he was here the last visit he was felt to have hyper granulated tissue and was treated with silver nitrate chemical cauterization 10-14-2022 upon evaluation today patient appears to be doing poorly in regard to the wound in the right ischial location. Unfortunately he tells me that the [place], [place] ([id number]) [id number] Page 3 of 10 dressing seems to be falling off frequently. Unfortunately I think that this has contributed to him  developing an infection which appears to be exemplified by the fact that he has bone exposed which is actually necrotic and working its way out of  the wound up and have to perform a fairly significant debridement today it does appear. I think that he needs to have these dressings ensure that they are in place at all times. Also discussed with the patient that we will get a need to do some testing in order to see if he indeed has an infection. If he does have a bone infection which I think is pretty likely to be osteomyelitis of this ischial location. I discussed that with the patient in detail today and depending on what we see him and make any adjustments in care as far as going forward is concerned. Obviously depressed antibiotic that we gave him that he tells me was somewhat irritating as far as the stomach side effects are concerned. He states he really would not want to be on that again. Either way I feel he is probably going to be placed on something once we get the results of the culture back. I am honestly concerned about the fact that he comes into the clinic without any dressings in place I am not sure how often they are really staying where they need to be as far as this wound is concerned. I actually cannot remember seeing him anytime in the past 2 months or so at least that he came in with a dressing intact. 10-21-2022 upon evaluation today patient presents for initial inspection here in our clinic concerning issues that he has been having with a wound in the sacral area. Again last time he was here I did debride away necrotic bone which was confirmed on pathology although it was not necessarily confirmed that this was secondary to osteomyelitis on the pathology report. Also the x-ray did not neither confirm nor deny the presence of osteomyelitis and his culture unfortunately showed multiple organisms with nothing predominance of this does not help to rule and tailor down exactly what is  going on here. Fortunately I do not see any signs of active infection systemically which is great news. With that being said locally I am definitely seeing some issues here. In fact this is warm to touch around the sacral area and I think that cellulitis is definitely present I just cannot pinpoint the exact organism. With that being said I did discuss with the patient as well today that I do believe that he probably needs an MRI in order to further quantify the extent of her likely osteomyelitis and this again is something that we are going to order this will need to be without contrast due to the fact that he is allergic to contrast dye. 11-18-2022 upon evaluation today patient's wound is actually showing some signs of improvement currently. We have been doing the Dakin's moistened gauze dressing which I think is helping to clean up the wound for seeing some granulation growing and still the tissue is not quite as healthy as I would love to see but better than previous. He does have osteomyelitis but the good news is he seems to be treated well with the antibiotics he does have a refill that was just picked up and I think that he needs to continue to take that for certain. 3/7; patient is on a 6-week clinic hiatus. He lives at home with his sister however she is not at home all the time. He transfers out of bed and a sliding board. He claims he is only in the in the wheelchair about 4 5 hours. He does have underlying osteomyelitis I am not sure about the  status of the antibiotics here. Part of the wound still easily probes to bone 12-23-2022 upon evaluation today patient appears to be doing well currently in regard to his wound all things considered there is still a lot of hypergranulation but I do feel like that with the Vashe moistened gauze is actually doing better than he was previously with the Hydrofera Blue. This just did not seem to be staying in place and with the Vashe through able to change  this more frequently. Readmission: 03-31-2023 this is a gentleman whom I have seen multiple times intermittently throughout the years compliance has been a real issue however. I do feel like there is some significant issues here still with the fact that dressing changes and keeping dressings in place are still pretty much as a significant problem here for [place]. I do not see any signs of active infection at this time which is great news but at the same time the wounds do not worse in fact he has another wound different than what I even noted previous. Subsequently he I do believe that the patient should continue to do monitor for any evidence of infection or worsening. Overall I think he needs to have regular follow-up visits with [place] and I think that he needs to have dressings in place at all times. He tells me that he is at home primarily by himself transferring himself from the first thing in the morning through at least 3 in the afternoon as his sister works he really needs somebody gets a caregiver with him throughout the day. 04-12-2023 upon evaluation today patient appears to be doing a little worse currently in regard to his wounds. He has been tolerating the dressing changes without complication. Fortunately I do not see any evidence of active infection locally nor systemically at this point. 04-21-2023 upon evaluation today patient's wound unfortunately appears to have purulent drainage and also there is a malodorous drainage at this point. Unfortunately I think that there is something going on here that does not seem to be doing nearly as good as what we would like to see. I think that he is likely going to require some antibiotics. He has taken Cipro before without complication. Electronic Signature(s) Signed: 04/21/2023 2:01:40 PM By: Allen Derry PA-C Entered By: Allen Derry on 04/21/2023 14:01:40 -------------------------------------------------------------------------------- Physical Exam  Details Patient Name: Date of ServiceEulas [name] [facility], [name] 04/21/2023 1:30 PM Medical Record Number: [id number] Patient Account Number: [id number] Date of Birth/Sex: Treating RN: September 02, 1966 (56 y.o. [name] Primary Care Provider: Aram Beecham Other Clinician: Referring Provider: Treating Provider/Extender: Hermine Messick Weeks in Treatment: 3 Constitutional Well-nourished and well-hydrated in no acute distress. Respiratory normal breathing without difficulty. Icard, Chase Hill ([id number]) [id number] Page 4 of 10 Psychiatric this patient is able to make decisions and demonstrates good insight into disease process. Alert and Oriented x 3. pleasant and cooperative. Notes Upon inspection patient's wound showed signs of poor granulation at this point. Unfortunately I believe that there is some infection going on at this time and I am unsure if this is Pseudomonas or something of the like but there is definitely a malodorous drainage which is consistent with something going on from an infectious standpoint. With that being said we will get a do a PCR culture we will see with the results of that show. I did not perform any debridement today at this time. Electronic Signature(s) Signed: 04/21/2023 2:02:22 PM By: Allen Derry PA-C Entered By: Allen Derry on  04/21/2023 14:02:22 -------------------------------------------------------------------------------- Physician Orders Details Patient Name: Date of Service: [name] [facility], [name] 04/21/2023 1:30 PM Medical Record Number: [id number] Patient Account Number: [id number] Date of Birth/Sex: Treating RN: Dec 28, 1965 (56 y.o. [name]) [name] Primary Care Provider: Aram Beecham Other Clinician: Referring Provider: Treating Provider/Extender: Gabriel Earing in Treatment: 3 Verbal / Phone Orders: No Diagnosis Coding ICD-10 Coding Code Description L89.314 Pressure ulcer of  right buttock, stage 4 Q76.0 Spina bifida occulta Z93.3 Colostomy status I10 Essential (primary) hypertension I25.10 Atherosclerotic heart disease of native coronary artery without angina pectoris Follow-up Appointments Return Appointment in 1 week. Bathing/ [employer] wounds with antibacterial soap and water. Anesthetic (Use 'Patient Medications' Section for Anesthetic Order Entry) Lidocaine applied to wound bed Wound Treatment Wound #5 - Gluteus Wound Laterality: Right, Midline Cleanser: Byram Ancillary Kit - 15 Day Supply (Generic) 3 x Per Week/30 Days Discharge Instructions: Use supplies as instructed; Kit contains: (15) Saline Bullets; (15) 3x3 Gauze; 15 pr Gloves Prim Dressing: Hydrofera Blue Ready Transfer Foam, 4x5 (in/in) (Generic) 3 x Per Week/30 Days ary Discharge Instructions: Apply Hydrofera Blue Ready to wound bed as directed Secondary Dressing: (BORDER) Zetuvit Plus SILICONE BORDER Dressing 4x4 (in/in) (Generic) 3 x Per Week/30 Days Discharge Instructions: Please do not put silicone bordered dressings under wraps. Use non-bordered dressing only. Wound #6 - Gluteus Wound Laterality: Right, Lateral Cleanser: Byram Ancillary Kit - 15 Day Supply (Generic) 3 x Per Week/30 Days Discharge Instructions: Use supplies as instructed; Kit contains: (15) Saline Bullets; (15) 3x3 Gauze; 15 pr Gloves Prim Dressing: Hydrofera Blue Ready Transfer Foam, 4x5 (in/in) (Generic) 3 x Per Week/30 Days ary Discharge Instructions: Apply Hydrofera Blue Ready to wound bed as directed JOSEL, KEO ([id number]) [id number] Page 5 of 10 Secondary Dressing: (BORDER) Zetuvit Plus SILICONE BORDER Dressing 4x4 (in/in) (Generic) 3 x Per Week/30 Days Discharge Instructions: Please do not put silicone bordered dressings under wraps. Use non-bordered dressing only. Laboratory Bacteria identified in Wound by Culture (MICRO) - increased drainage, foul odor left gluteal -  (ICD10 L89.314 - Pressure ulcer of right buttock, stage 4) LOINC Code: 6462-6 Convenience Name: Wound culture routine Patient Medications llergies: Iodinated Contrast Media, Sulfa (Sulfonamide Antibiotics), latex, morphine A Notifications Medication Indication Start End 04/21/2023 Cipro DOSE 1 - oral 500 mg tablet - 1 tablet oral twice a day x 14 days. Do not take Trazadone while on this medication and take the Carafate and vitamin separate from this Electronic Signature(s) Signed: 04/21/2023 2:10:58 PM By: Allen Derry PA-C Previous Signature: 04/21/2023 2:04:19 PM Version By: [name] RN Entered By: Allen Derry on 04/21/2023 14:10:58 -------------------------------------------------------------------------------- Problem List Details Patient Name: Date of Service: [name] TH, [name] 04/21/2023 1:30 PM Medical Record Number: [id number] Patient Account Number: [id number] Date of Birth/Sex: Treating RN: 1966/02/13 (56 y.o. [name] Primary Care Provider: Aram Beecham Other Clinician: Referring Provider: Treating Provider/Extender: Hermine Messick Weeks in Treatment: 3 Active Problems ICD-10 Encounter Code Description Active Date MDM Diagnosis L89.314 Pressure ulcer of right buttock, stage 4 03/31/2023 No Yes Q76.0 Spina bifida occulta 03/31/2023 No Yes Z93.3 Colostomy status 03/31/2023 No Yes I10 Essential (primary) hypertension 03/31/2023 No Yes I25.10 Atherosclerotic heart disease of native coronary artery without angina pectoris 03/31/2023 No Yes Chase, [name] ([id number]) [id number] Page 6 of 10 Inactive Problems Resolved Problems Electronic Signature(s) Signed: 04/21/2023 1:33:37 PM By: Allen Derry PA-C Entered By: Allen Derry on 04/21/2023 13:33:37 -------------------------------------------------------------------------------- Progress Note Details Patient Name: Date of Service: FA  IRCLO TH, [name] 04/21/2023 1:30  PM Medical Record Number: [id number] Patient Account Number: [id number] Date of Birth/Sex: Treating RN: [birth year] (56 y.o. [name]) [name] Primary Care Provider: Aram Beecham Other Clinician: Referring Provider: Treating Provider/Extender: Gabriel Earing in Treatment: 3 Subjective Chief Complaint Information obtained from Patient Right ischial tuberosity pressure ulcer History of Present Illness (HPI) 57 year old male with a history of spina bifida presenting to [place] with a history of a open wound on the left gluteal region near his upper thigh which she's had for several weeks. He was seen in the ED recently at [facility] and was put on doxycycline and was advised some local care. his past medical history is significant for spinal bifid, neurogenic bladder, kidney stones, sacral pressure sores, constipation. Past surgical history significant for partial cystectomy, ileal conduit, VP shunt removal, percutaneous nephro lithotripsy. In the remote past the patient says he's had some treatment for a ulcer on this area and was treated with a skin graft. Readmission: 10/16/2021 upon evaluation today patient appears to be doing somewhat poorly in regard to a fairly large wound noted over the right ischial tuberosity location. This is a stage IV pressure ulcer. Of note this is also positive for osteomyelitis upon a CT scan which was performed during the time that he was admitted in the hospital on 09/19/2021. With that being said it is noted in the right inferior buttock that he has a decubitus ulcer which extends to the posterior toe inferior right ischium where there is evidence of osteomyelitis. When he was here in the office actually missed that this was positive I missed read it and thought it said that there was no osteomyelitis. That is not the case there is in fact osteomyelitis noted here. I actually did review his notes as well in epic. Looking to the  discharge summary it appears that the patient was admitted from 09/19/2021 through 09/23/2021. He was discharged home with home health care according to the note. With that being said from what I understand his sister actually takes care of him at this point. He was also to follow-up with a primary care provider and here at the wound care center within a week. I am just now seeing him on the 13th almost a month after discharge. He does have a history of again osteomyelitis of this right hip/ischial tuberosity location. 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 The reason for his admission was actually worsening of the decubitus ulcer upon admission. He does have chronic osteomyelitis of this area. He was treated with empiric antibiotics he had acute kidney injury which improved with IV fluids he also had hypokalemia. Surgical debridement was performed by general surgery at that time and ID was consulted to assist with management according to the notes. IV antibiotics were recommended but patient declined and wanted oral antibiotics at discharge. Therefore no IV antibiotics were planned for discharge time. This case was under the care of Dr. Joylene Draft while he was in the hospital when she did discharge him with a 2-week course of doxycycline and Augmentin according to the notes. It looks like Dr. Judithann Sheen office did call and leave a message for the patient to get scheduled for a follow-up visit after hospital discharge I do not see where he showed up in fact  it appears that the patient has a no-show letter from Dr. Judithann Sheen office noted in epic due to a appointment that was missed on 10/06/2021. Looking at the pictures from Dr. Cristine Polio notes on 09/23/2021 it does appear that the wound is a little bit better as far as the cleanliness of the surface of the  wound today but nonetheless still is a very significant wound. 10/26/2021 upon evaluation today patient appears to be doing okay in regard to his wound. Again this is a wound that he did indeed have osteomyelitis and previous. With that being said I do not see any signs of a worsening infection at this time which is good news. Overall I think that we are headed in the right direction although still I think he needs more aggressive offloading. 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 I do believe this would be ideal for him to be honest. Readmission: 01-15-2022 upon evaluation today patient presents for reevaluation he was last seen on October 26, 2021. At that time unfortunately he was having issues with a significant ischial pressure ulcer on the right ischial tuberosity. Subsequently he ended up having decent response to the Dakin's moistened gauze dressing that was previously being utilized and subsequently the patient's sister who is his primary caregiver as well states that she decided just to try to manage this at home. Nonetheless unfortunately this is getting a little larger not better. He does spend a lot of the day in his chair. He does have an air mattress according what they tell me today. That was previously ordered. With that being said I do believe that at this time things do appear to be doing much better from the standpoint of infection I do not see any signs of overt infection. There is also no evidence of systemic infection. No fevers, chills, nausea, vomiting, or Chase, [name] ([id number]) [id number] Page 7 of 10 diarrhea. With that being said I do believe based on what I am seeing the wound is a bit cleaner and he possibly could be an excellent candidate for a wound VAC. 02-05-2022 upon evaluation today patient appears to be doing about the same in regard to his wound. Unfortunately he is not any better but he also really has  not had the wound VAC on sufficiently. There is been some confusion with the orders part of that I think is our follow-up with the way the order was written part of it may be with how the wound VAC is being placed either way we need to see what we can do to try to improve things in this regard. We will clarify the orders today going forward for home health. Unfortunately the patient does have a new wound today which is on the opposite side. This is not good 1 was bad enough have another area to open is definitely not a good thing. Its not as deep but nonetheless is also not very shallow either. We may potentially be able to get this to the point that we could bridge the 2 together in order to wound VAC both but right now want a focus on to try to get the wound VAC going for the main wound initially we will likely use Hydrofera Blue on the new wound.Marland Kitchen 02-19-2022 upon evaluation today patient's wound appears to be doing a little bit worse in the way of maceration fortunately there is no signs of active infection locally or systemically which is great news. No fevers,  chills, nausea, vomiting, or diarrhea. 03-05-2022 upon evaluation today patient unfortunately does not appear to be doing nearly as good in regard to his wounds as I would like to see. Fortunately I do not see any evidence of active infection that is obvious although I am can obtain a wound culture to ensure that there is not anything getting worse here as far as the wound without the wound VAC on his concern. With that being said I will go ahead and get this sent in and evaluated will initiate treatment as needed going forward if necessary. With regard to the wound with the wound VAC this continues to be placed directly over the wound and there is obvious signs of deep tissue injury. The suction being here means that he is sitting on it I think this may be part of the reason why it is coming off although not 100% certain. 03-12-2022 upon  evaluation today patient still has not gotten the antibiotics that were called in for him 4 days ago. He tells me that his sister just told him this morning that they were ready but that she has not had a chance to go pick them up. Nonetheless I feel like that this is really something he needs any should have been taken that not the other antibiotics which were not doing the job for him to be honest. The patient states and I completely agree that there is really no way he would have known relying on his sister to let him know obviously. Nonetheless I do believe that he needs to not be taking any other antibiotics and be on the Levaquin as soon as possible. He tells me that she said she was going to pick them up today. In regard to home health I am hopeful that they actually have been bridging this now. I do think that the wound looks better and this is good news. With that being said I am not certain that I can really tell for sure how this was attached it was removed before he came in and the black foam left in place. I really want him to leave the wound VAC in place until he comes in so that we can monitor and make sure that this is being done correctly he voiced understanding. 03-19-2022 upon evaluation today and much happier with how things appear as far as the patient's wounds are concerned. I think he is on a much better track and they are doing a better job at bridging although its not quite where I like to see it is still more posterior which I think the patient needs to be a little bit more lateral on his side. Either way this is something that I did marked with a small dot today for him to tell the home health nurses well so she can bridge it to that location also think the tubing should go up instead of going down to just make it less cumbersome overall for him. 04-19-2022 upon evaluation today patient appears to be doing a little better in regard to his wounds. Fortunately I do not see any  signs of infection I do believe he is on the right track. The wound VAC does seem to be helping to clean the wound up a bit and bring it in from the base at work. This is good news. 05-10-2022 upon evaluation today patient appears to be doing well currently in regard to his wounds in general in fact of the smaller of the 2  wounds without the wound VAC actually appears to be doing so much better this is almost completely closed and very pleased in that regard. He has been staying off of this he tells me and shows. With that being said the wound with the wound VAC is going require some sharp debridement there is some tissue which is not quite as healthy and we are going to go ahead and continue that for probably 2 more weeks although I think we may be closer to discontinuing this as well since it has healed and quite significantly. 05-24-2022 upon evaluation today patient appears to be doing well with regard to his wound in fact this looks better than I have been expected. This actually I think is a good time to go ahead and switch away from the wound VAC based on what I am seeing. I think we go to [facility] with a bordered foam dressing. 06-21-2022 upon evaluation today patient appears to be doing well currently in regard to his wound this is measuring smaller and looks well. Fortunately there does not appear to be any signs of active infection locally or systemically at this time which is great news. No fevers, chills, nausea, vomiting, or diarrhea. 07-29-2022 upon evaluation today patient appears to be doing well currently in regard to his wound. This is actually measuring smaller although there is a lot of hypergranulation noted. Fortunately there does not appear to be any signs of active infection locally or systemically at this time. No fevers, chills, nausea, vomiting, or diarrhea. 11/9; pressure ulcer on the right buttock in the setting of spina bifida and lower extremity paralysis. He has been  using [facility] 09-02-2022 upon evaluation today patient appears to be doing well currently in regard to his wound. We have been using Hydrofera Blue this is showing signs of improvement as far as getting smaller is concerned. There is some drainage around the edges of the wound but I think a lot of this is due to the fact he did not even have a dressing on that he came in today. 12/29; right buttock pressure area which was initially stage IV. This is in the setting of a patient with spina bifida spends most of his time in a wheelchair. I am not certain how much he is offloading this. He has been using for [facility] as a primary dressing. When he was here the last visit he was felt to have hyper granulated tissue and was treated with silver nitrate chemical cauterization 10-14-2022 upon evaluation today patient appears to be doing poorly in regard to the wound in the right ischial location. Unfortunately he tells me that the dressing seems to be falling off frequently. Unfortunately I think that this has contributed to him developing an infection which appears to be exemplified by the fact that he has bone exposed which is actually necrotic and working its way out of the wound up and have to perform a fairly significant debridement today it does appear. I think that he needs to have these dressings ensure that they are in place at all times. Also discussed with the patient that we will get a need to do some testing in order to see if he indeed has an infection. If he does have a bone infection which I think is pretty likely to be osteomyelitis of this ischial location. I discussed that with the patient in detail today and depending on what we see him and make any adjustments in care as far  as going forward is concerned. Obviously depressed antibiotic that we gave him that he tells me was somewhat irritating as far as the stomach side effects are concerned. He states he really would not want to  be on that again. Either way I feel he is probably going to be placed on something once we get the results of the culture back. I am honestly concerned about the fact that he comes into the clinic without any dressings in place I am not sure how often they are really staying where they need to be as far as this wound is concerned. I actually cannot remember seeing him anytime in the past 2 months or so at least that he came in with a dressing intact. 10-21-2022 upon evaluation today patient presents for initial inspection here in our clinic concerning issues that he has been having with a wound in the sacral area. Again last time he was here I did debride away necrotic bone which was confirmed on pathology although it was not necessarily confirmed that this was secondary to osteomyelitis on the pathology report. Also the x-ray did not neither confirm nor deny the presence of osteomyelitis and his culture unfortunately showed multiple organisms with nothing predominance of this does not help to rule and tailor down exactly what is going on here. Fortunately I do not see any signs of active infection systemically which is great news. With that being said locally I am definitely seeing some issues here. In fact this is warm to touch around the sacral area and I think that cellulitis is definitely present I just cannot pinpoint the exact organism. With that being said I did discuss with the patient as well today that I do believe that he probably needs an MRI in order to further quantify the extent of her likely osteomyelitis and this again is something that we are going to order this will need to be without contrast due to the fact that he is allergic to contrast dye. 11-18-2022 upon evaluation today patient's wound is actually showing some signs of improvement currently. We have been doing the Dakin's moistened gauze dressing which I think is helping to clean up the wound for seeing some granulation growing  and still the tissue is not quite as healthy as I would love to see but better than previous. He does have osteomyelitis but the good news is he seems to be treated well with the antibiotics he does have a refill that was just picked up and I think that he needs to continue to take that for certain. [place], [place] ([id number]) [id number] Page 8 of 10 3/7; patient is on a 6-week clinic hiatus. He lives at home with his sister however she is not at home all the time. He transfers out of bed and a sliding board. He claims he is only in the in the wheelchair about 4 5 hours. He does have underlying osteomyelitis I am not sure about the status of the antibiotics here. Part of the wound still easily probes to bone 12-23-2022 upon evaluation today patient appears to be doing well currently in regard to his wound all things considered there is still a lot of hypergranulation but I do feel like that with the Vashe moistened gauze is actually doing better than he was previously with the Hydrofera Blue. This just did not seem to be staying in place and with the Vashe through able to change this more frequently. Readmission: 03-31-2023 this is a gentleman whom I have  seen multiple times intermittently throughout the years compliance has been a real issue however. I do feel like there is some significant issues here still with the fact that dressing changes and keeping dressings in place are still pretty much as a significant problem here for [place]. I do not see any signs of active infection at this time which is great news but at the same time the wounds do not worse in fact he has another wound different than what I even noted previous. Subsequently he I do believe that the patient should continue to do monitor for any evidence of infection or worsening. Overall I think he needs to have regular follow-up visits with [place] and I think that he needs to have dressings in place at all times.  He tells me that he is at home primarily by himself transferring himself from the first thing in the morning through at least 3 in the afternoon as his sister works he really needs somebody gets a caregiver with him throughout the day. 04-12-2023 upon evaluation today patient appears to be doing a little worse currently in regard to his wounds. He has been tolerating the dressing changes without complication. Fortunately I do not see any evidence of active infection locally nor systemically at this point. 04-21-2023 upon evaluation today patient's wound unfortunately appears to have purulent drainage and also there is a malodorous drainage at this point. Unfortunately I think that there is something going on here that does not seem to be doing nearly as good as what we would like to see. I think that he is likely going to require some antibiotics. He has taken Cipro before without complication. Objective Constitutional Well-nourished and well-hydrated in no acute distress. Vitals Time Taken: 1:35 PM, Height: 59 in, Weight: 90 lbs, BMI: 18.2, Temperature: 97.5 F, Pulse: 97 bpm, Respiratory Rate: 18 breaths/min, Blood Pressure: 94/68 mmHg. Respiratory normal breathing without difficulty. Psychiatric this patient is able to make decisions and demonstrates good insight into disease process. Alert and Oriented x 3. pleasant and cooperative. General Notes: Upon inspection patient's wound showed signs of poor granulation at this point. Unfortunately I believe that there is some infection going on at this time and I am unsure if this is Pseudomonas or something of the like but there is definitely a malodorous drainage which is consistent with something going on from an infectious standpoint. With that being said we will get a do a PCR culture we will see with the results of that show. I did not perform any debridement today at this time. Integumentary (Hair, Skin) Wound #5 status is Open. Original cause  of wound was Gradually Appeared. The date acquired was: 10/04/2021. The wound has been in treatment 3 weeks. The wound is located on the Right,Midline Gluteus. The wound measures 6cm length x 3cm width x 1.8cm depth; 14.137cm^2 area and 25.447cm^3 volume. There is Fat Layer (Subcutaneous Tissue) exposed. There is no tunneling or undermining noted. There is a large amount of serosanguineous drainage noted. There is large (67-100%) red granulation within the wound bed. There is a small (1-33%) amount of necrotic tissue within the wound bed including Adherent Slough. Wound #6 status is Open. Original cause of wound was Gradually Appeared. The date acquired was: 10/04/2021. The wound has been in treatment 3 weeks. The wound is located on the Right,Lateral Gluteus. The wound measures 2.5cm length x 2cm width x 1.5cm depth; 3.927cm^2 area and 5.89cm^3 volume. There is Fat Layer (Subcutaneous Tissue) exposed. There is no  tunneling or undermining noted. There is a large amount of serosanguineous drainage noted. There is small (1-33%) red granulation within the wound bed. There is a large (67-100%) amount of necrotic tissue within the wound bed including Adherent Slough. Assessment Active Problems ICD-10 Pressure ulcer of right buttock, stage 4 Spina bifida occulta Colostomy status Essential (primary) hypertension Atherosclerotic heart disease of native coronary artery without angina pectoris Plan Follow-up Appointments: Return Appointment in 1 week. Bathing/ Shower/ Hygiene: Chase, [name] ([id number]) [id number] Page 9 of 10 Wash wounds with antibacterial soap and water. Anesthetic (Use 'Patient Medications' Section for Anesthetic Order Entry): Lidocaine applied to wound bed Laboratory ordered were: Wound culture routine - increased drainage, foul odor left gluteal The following medication(s) was prescribed: Cipro oral 500 mg tablet 1 1 tablet oral twice a day x 14 days.  Do not take Trazadone while on this medication and take the Carafate and vitamin separate from this starting 04/21/2023 WOUND #5: - Gluteus Wound Laterality: Right, Midline Cleanser: Byram Ancillary Kit - 15 Day Supply (Generic) 3 x Per Week/30 Days Discharge Instructions: Use supplies as instructed; Kit contains: (15) Saline Bullets; (15) 3x3 Gauze; 15 pr Gloves Prim Dressing: Hydrofera Blue Ready Transfer Foam, 4x5 (in/in) (Generic) 3 x Per Week/30 Days ary Discharge Instructions: Apply Hydrofera Blue Ready to wound bed as directed Secondary Dressing: (BORDER) Zetuvit Plus SILICONE BORDER Dressing 4x4 (in/in) (Generic) 3 x Per Week/30 Days Discharge Instructions: Please do not put silicone bordered dressings under wraps. Use non-bordered dressing only. WOUND #6: - Gluteus Wound Laterality: Right, Lateral Cleanser: Byram Ancillary Kit - 15 Day Supply (Generic) 3 x Per Week/30 Days Discharge Instructions: Use supplies as instructed; Kit contains: (15) Saline Bullets; (15) 3x3 Gauze; 15 pr Gloves Prim Dressing: Hydrofera Blue Ready Transfer Foam, 4x5 (in/in) (Generic) 3 x Per Week/30 Days ary Discharge Instructions: Apply Hydrofera Blue Ready to wound bed as directed Secondary Dressing: (BORDER) Zetuvit Plus SILICONE BORDER Dressing 4x4 (in/in) (Generic) 3 x Per Week/30 Days Discharge Instructions: Please do not put silicone bordered dressings under wraps. Use non-bordered dressing only. 1. Based on what I am seeing I do believe that the patient would benefit from going ahead and start him on antibiotic, recommend Cipro he has not been taking his trazodone as he was out of it this is actually a good thing I do not want him to continue with the trazodone anyway. 2. I am good recommend as well that the patient should continue to monitor for any signs of infection or worsening. Obviously if anything changes at the office let me know. he knows to come we will see patient back for reevaluation in 1  week here in the clinic. If anything worsens or changes patient will contact our office for additional recommendations. Electronic Signature(s) Signed: 04/21/2023 2:11:17 PM By: Allen Derry PA-C Previous Signature: 04/21/2023 2:03:01 PM Version By: Allen Derry PA-C Entered By: Allen Derry on 04/21/2023 14:11:16 -------------------------------------------------------------------------------- SuperBill Details Patient Name: Date of Service: [name] TH, [name] 04/21/2023 Medical Record Number: [id number] Patient Account Number: [id number] Date of Birth/Sex: Treating RN: 28-Feb-1966 (56 y.o. [name]) [name] Primary Care Provider: Aram Beecham Other Clinician: Referring Provider: Treating Provider/Extender: Hermine Messick Weeks in Treatment: 3 Diagnosis Coding ICD-10 Codes Code Description L89.314 Pressure ulcer of right buttock, stage 4 Q76.0 Spina bifida occulta Z93.3 Colostomy status I10 Essential (primary) hypertension I25.10 Atherosclerotic heart disease of native coronary artery without angina pectoris Facility Procedures : CPT4 Code: [id number] Description: 99213 - WOUND CARE  VISIT-LEV 3 EST PT Modifier: Quantity: 1 Physician Procedures Chase, [name] ([id number]): CPT4 Code Description [id number] 99214 - WC PHYS LEVEL 4 - EST PT ICD-10 Diagnosis Description L89.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

## 2023-04-21 NOTE — Progress Notes (Addendum)
[place], [name] ([id number]) [id number] Page 1 of 10 Visit Report for 04/21/2023 Arrival Information Details Patient Name: Date of Service: [name] [facility], DO [place] LD 04/21/2023 1:30 PM Medical Record Number: [id number] Patient Account Number: [id number] Date of Birth/Sex: Treating RN: Jan 03, 1966 (56 y.o. [name]) [name] Primary Care Felma Pfefferle: Aram Beecham Other Clinician: Referring Adaja Wander: Treating Leeman Johnsey/Extender: Gabriel Earing in Treatment: 3 Visit Information History Since Last Visit Added or deleted any medications: No Patient Arrived: Wheel Chair Any new allergies or adverse reactions: No Arrival Time: 13:33 Had a fall or experienced change in No Accompanied By: self activities of daily living that may affect Transfer Assistance: Transfer Board risk of falls: Patient Identification Verified: Yes Signs or symptoms of abuse/neglect since last visito No Secondary Verification Process Completed: Yes Hospitalized since last visit: No Patient Requires Transmission-Based Precautions: No Implantable device outside of the clinic excluding No Patient Has Alerts: No cellular tissue based products placed in the center since last visit: Pain Present Now: No Electronic Signature(s) Signed: 04/21/2023 1:41:13 PM By: [name] RN Entered By: [name] on 04/21/2023 13:41:13 -------------------------------------------------------------------------------- Clinic Level of Care Assessment Details Patient Name: Date of ServiceEulas [name] [facility], [name] 04/21/2023 1:30 PM Medical Record Number: [id number] Patient Account Number: [id number] Date of Birth/Sex: Treating RN: [birth year] (56 y.o. [name] Primary Care Charell Faulk: Aram Beecham Other Clinician: Referring Jadien Lehigh: Treating Fendi Meinhardt/Extender: Gabriel Earing in Treatment: 3 Clinic Level of Care Assessment Items TOOL 4 Quantity Score X- 1 0 Use when  only an EandM is performed on FOLLOW-UP visit ASSESSMENTS - Nursing Assessment / Reassessment X- 1 10 Reassessment of Co-morbidities (includes updates in patient status) X- 1 5 Reassessment of Adherence to Treatment Plan ASSESSMENTS - Wound and Skin Assessment / Reassessment AVROM, ROBARTS ([id number]) [id number] Page 2 of 10 []  - 0 Simple Wound Assessment / Reassessment - one wound X- 2 5 Complex Wound Assessment / Reassessment - multiple wounds []  - 0 Dermatologic / Skin Assessment (not related to wound area) ASSESSMENTS - Focused Assessment []  - 0 Circumferential Edema Measurements - multi extremities []  - 0 Nutritional Assessment / Counseling / Intervention []  - 0 Lower Extremity Assessment (monofilament, tuning fork, pulses) []  - 0 Peripheral Arterial Disease Assessment (using hand held doppler) ASSESSMENTS - Ostomy and/or Continence Assessment and Care []  - 0 Incontinence Assessment and Management []  - 0 Ostomy Care Assessment and Management (repouching, etc.) PROCESS - Coordination of Care X - Simple Patient / Family Education for ongoing care 1 15 []  - 0 Complex (extensive) Patient / Family Education for ongoing care []  - 0 Staff obtains Chiropractor, Records, T Results / Process Orders est []  - 0 Staff telephones HHA, Nursing Homes / Clarify orders / etc []  - 0 Routine Transfer to another Facility (non-emergent condition) []  - 0 Routine Hospital Admission (non-emergent condition) []  - 0 New Admissions / Manufacturing engineer / Ordering NPWT Apligraf, etc. , []  - 0 Emergency Hospital Admission (emergent condition) X- 1 10 Simple Discharge Coordination []  - 0 Complex (extensive) Discharge Coordination PROCESS - Special Needs []  - 0 Pediatric / Minor Patient Management []  - 0 Isolation Patient Management []  - 0 Hearing / Language / Visual special needs []  - 0 Assessment of Community assistance (transportation, D/C planning,  etc.) []  - 0 Additional assistance / Altered mentation []  - 0 Support Surface(s) Assessment (bed, cushion, seat, etc.) INTERVENTIONS - Wound Cleansing / Measurement []  - 0 Simple Wound Cleansing - one wound X- 2  5 Complex Wound Cleansing - multiple wounds X- 1 5 Wound Imaging (photographs - any number of wounds) []  - 0 Wound Tracing (instead of photographs) []  - 0 Simple Wound Measurement - one wound X- 2 5 Complex Wound Measurement - multiple wounds INTERVENTIONS - Wound Dressings X - Small Wound Dressing one or multiple wounds 2 10 []  - 0 Medium Wound Dressing one or multiple wounds []  - 0 Large Wound Dressing one or multiple wounds []  - 0 Application of Medications - topical []  - 0 Application of Medications - injection INTERVENTIONS - Miscellaneous []  - 0 External ear exam []  - 0 Specimen Collection (cultures, biopsies, blood, body fluids, etc.) [name] ([id number]) [id number] Page 3 of 10 []  - 0 Specimen(s) / Culture(s) sent or taken to Lab for analysis []  - 0 Patient Transfer (multiple staff / Michiel Sites Lift / Similar devices) []  - 0 Simple Staple / Suture removal (25 or less) []  - 0 Complex Staple / Suture removal (26 or more) []  - 0 Hypo / Hyperglycemic Management (close monitor of Blood Glucose) []  - 0 Ankle / Brachial Index (ABI) - do not check if billed separately X- 1 5 Vital Signs Has the patient been seen at the hospital within the last three years: Yes Total Score: 100 Level Of Care: New/Established - Level 3 Electronic Signature(s) Signed: 04/21/2023 3:42:23 PM By: [name] RN Entered By: [name] on 04/21/2023 15:38:57 -------------------------------------------------------------------------------- Encounter Discharge Information Details Patient Name: Date of Service: [name] TH, [name] 04/21/2023 1:30 PM Medical Record Number: [id number] Patient Account Number: [id number] Date of Birth/Sex: Treating  RN: 1965-12-28 (56 y.o. [name] Primary Care Ipek Westra: Aram Beecham Other Clinician: Referring Obadiah Dennard: Treating Yosiah Jasmin/Extender: Gabriel Earing in Treatment: 3 Encounter Discharge Information Items Discharge Condition: Stable Ambulatory Status: Wheelchair Discharge Destination: Home Transportation: Private Auto Accompanied By: self Schedule Follow-up Appointment: Yes Clinical Summary of Care: Electronic Signature(s) Signed: 04/21/2023 3:39:53 PM By: [name] RN Entered By: [name] on 04/21/2023 15:39:52 -------------------------------------------------------------------------------- Lower Extremity Assessment Details Patient Name: Date of ServiceEulas [name] [facility], [name] 04/21/2023 1:30 PM Medical Record Number: [id number] Patient Account Number: [id number] BRYANN, MCNEALY ([id number]) [id number] Page 4 of 10 Date of Birth/Sex: Treating RN: October 01, 1966 (56 y.o. [name]) [name] Primary Care Thy Gullikson: Aram Beecham Other Clinician: Referring Kenitra Leventhal: Treating Jacquelyne Quarry/Extender: Gabriel Earing in Treatment: 3 Electronic Signature(s) Signed: 04/21/2023 1:43:07 PM By: [name] RN Entered By: [name] on 04/21/2023 13:43:07 -------------------------------------------------------------------------------- Multi Wound Chart Details Patient Name: Date of Service: [name] TH, [name] 04/21/2023 1:30 PM Medical Record Number: [id number] Patient Account Number: [id number] Date of Birth/Sex: Treating RN: 1966/02/13 (56 y.o. [name]) [name] Primary Care Kaitlin Alcindor: Aram Beecham Other Clinician: Referring Kassem Kibbe: Treating Laithan Conchas/Extender: Hermine Messick Weeks in Treatment: 3 Vital Signs Height(in): 59 Pulse(bpm): 97 Weight(lbs): 90 Blood Pressure(mmHg): 94/68 Body Mass Index(BMI): 18.2 Temperature(F): 97.5 Respiratory Rate(breaths/min): 18 [5:Photos:] [N/A:N/A] Right,  Midline Gluteus Right, Lateral Gluteus N/A Wound Location: Gradually Appeared Gradually Appeared N/A Wounding Event: Pressure Ulcer Pressure Ulcer N/A Primary Etiology: Cataracts, Glaucoma, Coronary Artery Cataracts, Glaucoma, Coronary Artery N/A Comorbid History: Disease, Hypertension, History of Disease, Hypertension, History of pressure wounds, Osteoarthritis, pressure wounds, Osteoarthritis, Paraplegia Paraplegia 10/04/2021 10/04/2021 N/A Date Acquired: 3 3 N/A Weeks of Treatment: Open Open N/A Wound Status: No No N/A Wound Recurrence: 6x3x1.8 2.5x2x1.5 N/A Measurements L x W x D (cm) 14.137 3.927 N/A A (cm) : rea 25.447 5.89 N/A Volume (  cm) : -44.00% -96.10% N/A % Reduction in A rea: -72.80% -1368.80% N/A % Reduction in Volume: Category/Stage IV Category/Stage III N/A Classification: Large Large N/A Exudate A mount: Serosanguineous Serosanguineous N/A Exudate Type: red, brown red, brown N/A Exudate Color: Large (67-100%) Small (1-33%) N/A Granulation A mount: Red Red N/A Granulation Quality: Small (1-33%) Large (67-100%) N/A Necrotic A mount: Fat Layer (Subcutaneous Tissue): Yes Fat Layer (Subcutaneous Tissue): Yes N/A Exposed Structures: Fascia: No Fascia: No Tendon: No Tendon: No Muscle: No Muscle: No [name] ([id number]) [id number] Page 5 of 10 Joint: No Joint: No Bone: No Bone: No N/A None N/A Epithelialization: Treatment Notes Electronic Signature(s) Signed: 04/21/2023 1:43:14 PM By: [name] RN Entered By: [name] on 04/21/2023 13:43:14 -------------------------------------------------------------------------------- Multi-Disciplinary Care Plan Details Patient Name: Date of Service: [name] TH, [name] 04/21/2023 1:30 PM Medical Record Number: [id number] Patient Account Number: [id number] Date of Birth/Sex: Treating RN: 1965/10/25 (56 y.o. [name] Primary Care Mariadejesus Cade: Aram Beecham  Other Clinician: Referring Marialice Newkirk: Treating Ji Feldner/Extender: Hermine Messick Weeks in Treatment: 3 Active Inactive Pressure Nursing Diagnoses: Potential for impaired tissue integrity related to pressure, friction, moisture, and shear Goals: Patient will remain free from development of additional pressure ulcers Date Initiated: 03/31/2023 Target Resolution Date: 04/30/2023 Goal Status: Active Interventions: Assess: immobility, friction, shearing, incontinence upon admission and as needed Assess offloading mechanisms upon admission and as needed Assess potential for pressure ulcer upon admission and as needed Notes: Wound/Skin Impairment Nursing Diagnoses: Knowledge deficit related to ulceration/compromised skin integrity Goals: Patient/caregiver will verbalize understanding of skin care regimen Date Initiated: 03/31/2023 Target Resolution Date: 04/30/2023 Goal Status: Active Ulcer/skin breakdown will have a volume reduction of 30% by week 4 Date Initiated: 03/31/2023 Target Resolution Date: 05/31/2023 Goal Status: Active Ulcer/skin breakdown will have a volume reduction of 50% by week 8 Date Initiated: 03/31/2023 Target Resolution Date: 07/01/2023 Goal Status: Active Ulcer/skin breakdown will have a volume reduction of 80% by week 12 Date Initiated: 03/31/2023 Target Resolution Date: 07/31/2023 Goal Status: Active Ulcer/skin breakdown will heal within 14 weeks Date Initiated: 03/31/2023 Target Resolution Date: 08/31/2023 Goal Status: Active [name], [name] ([id number]) [id number] Page 6 of 10 Interventions: Assess patient/caregiver ability to obtain necessary supplies Assess patient/caregiver ability to perform ulcer/skin care regimen upon admission and as needed Assess ulceration(s) every visit Notes: Electronic Signature(s) Signed: 04/21/2023 1:43:49 PM By: [name] RN Entered By: [name] on 04/21/2023  13:43:49 -------------------------------------------------------------------------------- Pain Assessment Details Patient Name: Date of ServiceEulas [name] [facility], [name] 04/21/2023 1:30 PM Medical Record Number: [id number] Patient Account Number: [id number] Date of Birth/Sex: Treating RN: 1966-07-10 (56 y.o. [name] Primary Care Stephie Xu: Aram Beecham Other Clinician: Referring Dal Blew: Treating Hlee Fringer/Extender: Hermine Messick Weeks in Treatment: 3 Active Problems Location of Pain Severity and Description of Pain Patient Has Paino No Site Locations Pain Management and Medication Current Pain Management: Electronic Signature(s) Signed: 04/21/2023 1:41:47 PM By: [name] RN Entered By: [name] on 04/21/2023 13:41:47 [name] ([id number]) [id number] Page 7 of 10 -------------------------------------------------------------------------------- Patient/Caregiver Education Details Patient Name: Date of Service: [name] [facility], DO [place] LD 7/18/2024andnbsp1:30 PM Medical Record Number: [id number] Patient Account Number: [id number] Date of Birth/Gender: Treating RN: [birth year] (56 y.o. [name]) [name] Primary Care Physician: Aram Beecham Other Clinician: Referring Physician: Treating Physician/Extender: Gabriel Earing in Treatment: 3 Education Assessment Education Provided To: Patient Education Topics Provided Pressure: Handouts: Pressure Injury: Prevention and Offloading Methods: Explain/Verbal Responses: State content correctly Wound/Skin Impairment:  Methods: Explain/Verbal Responses: State content correctly Electronic Signature(s) Signed: 04/21/2023 3:42:23 PM By: [name] RN Entered By: [name] on 04/21/2023 13:44:21 -------------------------------------------------------------------------------- Wound Assessment Details Patient Name: Date of Service: [name] [facility], [name] 04/21/2023  1:30 PM Medical Record Number: [id number] Patient Account Number: [id number] Date of Birth/Sex: Treating RN: Mar 20, 1966 (56 y.o. [name]) [name] Primary Care Ninel Abdella: Aram Beecham Other Clinician: Referring Husna Krone: Treating Zoriyah Scheidegger/Extender: Hermine Messick Weeks in Treatment: 3 Wound Status Wound Number: 5 Primary Pressure Ulcer Etiology: Wound Location: Right, Midline Gluteus Wound Open Wounding Event: Gradually Appeared Status: Date Acquired: 10/04/2021 Comorbid Cataracts, Glaucoma, Coronary Artery Disease, Hypertension, Weeks Of Treatment: 3 History: History of pressure wounds, Osteoarthritis, Paraplegia Clustered Wound: No Photos [place], [name] ([id number]) [id number] Page 8 of 10 Wound Measurements Length: (cm) 6 Width: (cm) 3 Depth: (cm) 1.8 Area: (cm) 14.137 Volume: (cm) 25.447 % Reduction in Area: -44% % Reduction in Volume: -72.8% Tunneling: No Undermining: No Wound Description Classification: Category/Stage IV Exudate Amount: Large Exudate Type: Serosanguineous Exudate Color: red, brown Foul Odor After Cleansing: No Slough/Fibrino Yes Wound Bed Granulation Amount: Large (67-100%) Exposed Structure Granulation Quality: Red Fascia Exposed: No Necrotic Amount: Small (1-33%) Fat Layer (Subcutaneous Tissue) Exposed: Yes Necrotic Quality: Adherent Slough Tendon Exposed: No Muscle Exposed: No Joint Exposed: No Bone Exposed: No Treatment Notes Wound #5 (Gluteus) Wound Laterality: Right, Midline Cleanser Byram Ancillary Kit - 15 Day Supply Discharge Instruction: Use supplies as instructed; Kit contains: (15) Saline Bullets; (15) 3x3 Gauze; 15 pr Gloves Peri-Wound Care Topical Primary Dressing Hydrofera Blue Ready Transfer Foam, 4x5 (in/in) Discharge Instruction: Apply Hydrofera Blue Ready to wound bed as directed Secondary Dressing (BORDER) Zetuvit Plus SILICONE BORDER Dressing 4x4 (in/in) Discharge  Instruction: Please do not put silicone bordered dressings under wraps. Use non-bordered dressing only. Secured With Compression Wrap Compression Stockings Facilities manager) Signed: 04/21/2023 1:42:40 PM By: [name] RN Entered By: [name] on 04/21/2023 13:42:40 [name] ([id number]) [id number] Page 9 of 10 -------------------------------------------------------------------------------- Wound Assessment Details Patient Name: Date of Service: [name] [facility], [name] 04/21/2023 1:30 PM Medical Record Number: [id number] Patient Account Number: [id number] Date of Birth/Sex: Treating RN: 10-Nov-1965 (56 y.o. [name]) [name] Primary Care Zamora Colton: Aram Beecham Other Clinician: Referring Tailer Volkert: Treating Nuriya Stuck/Extender: Hermine Messick Weeks in Treatment: 3 Wound Status Wound Number: 6 Primary Pressure Ulcer Etiology: Wound Location: Right, Lateral Gluteus Wound Open Wounding Event: Gradually Appeared Status: Date Acquired: 10/04/2021 Comorbid Cataracts, Glaucoma, Coronary Artery Disease, Hypertension, Weeks Of Treatment: 3 History: History of pressure wounds, Osteoarthritis, Paraplegia Clustered Wound: No Photos Wound Measurements Length: (cm) 2.5 Width: (cm) 2 Depth: (cm) 1.5 Area: (cm) 3.927 Volume: (cm) 5.89 % Reduction in Area: -96.1% % Reduction in Volume: -1368.8% Epithelialization: None Tunneling: No Undermining: No Wound Description Classification: Category/Stage III Exudate Amount: Large Exudate Type: Serosanguineous Exudate Color: red, brown Foul Odor After Cleansing: No Slough/Fibrino Yes Wound Bed Granulation Amount: Small (1-33%) Exposed Structure Granulation Quality: Red Fascia Exposed: No Necrotic Amount: Large (67-100%) Fat Layer (Subcutaneous Tissue) Exposed: Yes Necrotic Quality: Adherent Slough Tendon Exposed: No Muscle Exposed: No Joint Exposed: No Bone Exposed:  No Treatment Notes Wound #6 (Gluteus) Wound Laterality: Right, Lateral Cleanser Byram Ancillary Kit - 15 Day Supply Discharge Instruction: Use supplies as instructed; Kit contains: (15) Saline Bullets; (15) 3x3 Gauze; 15 pr Gloves Peri-Wound Care [place], [name] ([id number]) [id number] Page 10 of 10 Topical Primary Dressing Hydrofera Blue Ready Transfer Foam, 4x5 (in/in) Discharge Instruction: Apply [facility] Ready  to wound bed as directed Secondary Dressing (BORDER) Zetuvit Plus SILICONE BORDER Dressing 4x4 (in/in) Discharge Instruction: Please do not put silicone bordered dressings under wraps. Use non-bordered dressing only. Secured With Compression Wrap Compression Stockings Facilities manager) Signed: 04/21/2023 1:42:56 PM By: [name] RN Entered By: [name] on 04/21/2023 13:42:56 -------------------------------------------------------------------------------- Vitals Details Patient Name: Date of Service: [name] TH, [name] 04/21/2023 1:30 PM Medical Record Number: [id number] Patient Account Number: [id number] Date of Birth/Sex: Treating RN: 1965/11/23 (56 y.o. [name]) [name] Primary Care Bianca Raneri: Aram Beecham Other Clinician: Referring Hydie Langan: Treating Yoneko Talerico/Extender: Hermine Messick Weeks in Treatment: 3 Vital Signs Time Taken: 13:35 Temperature (F): 97.5 Height (in): 59 Pulse (bpm): 97 Weight (lbs): 90 Respiratory Rate (breaths/min): 18 Body Mass Index (BMI): 18.2 Blood Pressure (mmHg): 94/68 Reference Range: 80 - 120 mg / dl Electronic Signature(s) Signed: 04/21/2023 1:41:41 PM By: [name] RN Entered By: [name] on 04/21/2023 13:41:41

## 2023-04-28 ENCOUNTER — Ambulatory Visit: Payer: 59 | Admitting: Physician Assistant

## 2023-04-28 ENCOUNTER — Encounter: Payer: 59 | Admitting: Internal Medicine

## 2023-04-28 DIAGNOSIS — L89314 Pressure ulcer of right buttock, stage 4: Secondary | ICD-10-CM | POA: Diagnosis not present

## 2023-04-28 NOTE — Progress Notes (Addendum)
[name], Chase Hill ([id number]) [id number] Page 1 of 10 Visit Report for 04/28/2023 Arrival Information Details Patient Name: Date of Service: [name] Medical Record Number: [id number] Patient Account Number: [id number] Date of Birth/Sex: Treating RN: [birth year] (56 y.o. [name]) [name] Primary Care Harkirat Orozco: Aram Beecham Other Clinician: Referring Jamesia Linnen: Treating Eliyah Mcshea/Extender: RO BSO N, MICHA EL Cathlean Sauer in Treatment: 4 Visit Information History Since Last Visit Added or deleted any medications: No Patient Arrived: Wheel Chair Any new allergies or adverse reactions: No Arrival Time: 08:01 Had a fall or experienced change in No Accompanied By: self activities of daily living that may affect Transfer Assistance: None risk of falls: Patient Identification Verified: Yes Signs or symptoms of abuse/neglect since last visito No Secondary Verification Process Completed: Yes Hospitalized since last visit: No Patient Requires Transmission-Based Precautions: No Implantable device outside of the clinic excluding No Patient Has Alerts: No cellular tissue based products placed in the center since last visit: Has Dressing in Place as Prescribed: Yes Pain Present Now: No Electronic Signature(s) Signed: 04/28/2023 8:16:04 AM By: [name] RN Entered By: [name] on 04/28/2023 08:16:04 -------------------------------------------------------------------------------- Clinic Level of Care Assessment Details Patient Name: Date of ServiceEulas [name] [facility], [name] 04/28/2023 8:00 A [name] Medical Record Number: [id number] Patient Account Number: [id number] Date of Birth/Sex: Treating RN: January 09, 1966 (56 y.o. [name]) [name] Primary Care Musab Wingard: Aram Beecham Other Clinician: Referring Dasani Crear: Treating Inaaya Vellucci/Extender: RO BSO N, MICHA EL Cathlean Sauer in Treatment: 4 Clinic Level of Care  Assessment Items TOOL 4 Quantity Score X- 1 0 Use when only an EandM is performed on FOLLOW-UP visit ASSESSMENTS - Nursing Assessment / Reassessment X- 1 10 Reassessment of Co-morbidities (includes updates in patient status) X- 1 5 Reassessment of Adherence to Treatment Plan [name] ([id number]) [id number] Page 2 of 10 ASSESSMENTS - Wound and Skin A ssessment / Reassessment []  - Simple Wound Assessment / Reassessment - one wound 0 X- 2 5 Complex Wound Assessment / Reassessment - multiple wounds []  - 0 Dermatologic / Skin Assessment (not related to wound area) ASSESSMENTS - Focused Assessment []  - 0 Circumferential Edema Measurements - multi extremities []  - 0 Nutritional Assessment / Counseling / Intervention []  - 0 Lower Extremity Assessment (monofilament, tuning fork, pulses) []  - 0 Peripheral Arterial Disease Assessment (using hand held doppler) ASSESSMENTS - Ostomy and/or Continence Assessment and Care []  - 0 Incontinence Assessment and Management []  - 0 Ostomy Care Assessment and Management (repouching, etc.) PROCESS - Coordination of Care X - Simple Patient / Family Education for ongoing care 1 15 []  - 0 Complex (extensive) Patient / Family Education for ongoing care []  - 0 Staff obtains Chiropractor, Records, T Results / Process Orders est []  - 0 Staff telephones HHA, Nursing Homes / Clarify orders / etc []  - 0 Routine Transfer to another Facility (non-emergent condition) []  - 0 Routine Hospital Admission (non-emergent condition) []  - 0 New Admissions / Manufacturing engineer / Ordering NPWT Apligraf, etc. , []  - 0 Emergency Hospital Admission (emergent condition) X- 1 10 Simple Discharge Coordination []  - 0 Complex (extensive) Discharge Coordination PROCESS - Special Needs []  - 0 Pediatric / Minor Patient Management []  - 0 Isolation Patient Management []  - 0 Hearing / Language / Visual special needs []  -  0 Assessment of Community assistance (transportation, D/C planning, etc.) []  - 0 Additional assistance / Altered mentation []  - 0 Support Surface(s) Assessment (bed, cushion, seat, etc.)  INTERVENTIONS - Wound Cleansing / Measurement []  - 0 Simple Wound Cleansing - one wound X- 2 5 Complex Wound Cleansing - multiple wounds X- 1 5 Wound Imaging (photographs - any number of wounds) []  - 0 Wound Tracing (instead of photographs) []  - 0 Simple Wound Measurement - one wound X- 2 5 Complex Wound Measurement - multiple wounds INTERVENTIONS - Wound Dressings X - Small Wound Dressing one or multiple wounds 2 10 []  - 0 Medium Wound Dressing one or multiple wounds []  - 0 Large Wound Dressing one or multiple wounds []  - 0 Application of Medications - topical []  - 0 Application of Medications - injection INTERVENTIONS - Miscellaneous []  - 0 External ear exam [name] ([id number]) [id number] Page 3 of 10 []  - 0 Specimen Collection (cultures, biopsies, blood, body fluids, etc.) []  - 0 Specimen(s) / Culture(s) sent or taken to Lab for analysis []  - 0 Patient Transfer (multiple staff / Michiel Sites Lift / Similar devices) []  - 0 Simple Staple / Suture removal (25 or less) []  - 0 Complex Staple / Suture removal (26 or more) []  - 0 Hypo / Hyperglycemic Management (close monitor of Blood Glucose) []  - 0 Ankle / Brachial Index (ABI) - do not check if billed separately X- 1 5 Vital Signs Has the patient been seen at the hospital within the last three years: Yes Total Score: 100 Level Of Care: New/Established - Level 3 Electronic Signature(s) Signed: 04/29/2023 7:58:02 AM By: [name] RN Entered By: [name] on 04/28/2023 08:38:52 -------------------------------------------------------------------------------- Encounter Discharge Information Details Patient Name: Date of Service: [name] [facility], [name] 04/28/2023 8:00 A [name] Medical Record Number:  [id number] Patient Account Number: [id number] Date of Birth/Sex: Treating RN: Jun 26, 1966 (56 y.o. [name] Primary Care Johnpaul Gillentine: Aram Beecham Other Clinician: Referring Brenen Beigel: Treating Gael Londo/Extender: RO BSO N, MICHA EL Cathlean Sauer in Treatment: 4 Encounter Discharge Information Items Discharge Condition: Stable Ambulatory Status: Wheelchair Discharge Destination: Home Transportation: Private Auto Accompanied By: self Schedule Follow-up Appointment: Yes Clinical Summary of Care: Electronic Signature(s) Signed: 04/28/2023 8:39:42 AM By: [name] RN Entered By: [name] on 04/28/2023 08:39:42 -------------------------------------------------------------------------------- Lower Extremity Assessment Details Patient Name: Date of Service: [facility] TH, [name] 04/28/2023 8:00 A HERMILO DUTTER, Chase Hill ([id number]) [id number] Page 4 of 10 Medical Record Number: [id number] Patient Account Number: [id number] Date of Birth/Sex: Treating RN: [birth year] (56 y.o. [name]) [name] Primary Care Betzy Barbier: Aram Beecham Other Clinician: Referring Enio Hornback: Treating Scotlynn Noyes/Extender: RO BSO Dorris Carnes, MICHA EL Cathlean Sauer in Treatment: 4 Electronic Signature(s) Signed: 04/28/2023 8:18:58 AM By: [name] RN Entered By: [name] on 04/28/2023 08:18:57 -------------------------------------------------------------------------------- Multi Wound Chart Details Patient Name: Date of Service: [name] TH, [name] 04/28/2023 8:00 A [name] Medical Record Number: [id number] Patient Account Number: [id number] Date of Birth/Sex: Treating RN: 1966/04/24 (56 y.o. [name]) [name] Primary Care Cortland Crehan: Aram Beecham Other Clinician: Referring Davier Tramell: Treating Cheryllynn Sarff/Extender: RO BSO N, MICHA EL Cathlean Sauer in Treatment: 4 Vital Signs Height(in): 59 Pulse(bpm): 88 Weight(lbs): 90 Blood Pressure(mmHg): 98/62 Body Mass  Index(BMI): 18.2 Temperature(F): 97.8 Respiratory Rate(breaths/min): 18 [5:Photos:] [N/A:N/A] Right, Midline Gluteus Right, Lateral Gluteus N/A Wound Location: Gradually Appeared Gradually Appeared N/A Wounding Event: Pressure Ulcer Pressure Ulcer N/A Primary Etiology: Cataracts, Glaucoma, Coronary Artery Cataracts, Glaucoma, Coronary Artery N/A Comorbid History: Disease, Hypertension, History of Disease, Hypertension, History of pressure wounds, Osteoarthritis, pressure wounds, Osteoarthritis, Paraplegia Paraplegia 10/04/2021 10/04/2021 N/A Date Acquired: 4 4 N/A Weeks of  Treatment: Open Open N/A Wound Status: No No N/A Wound Recurrence: 2.7x3x1.7 2.7x2x1.5 N/A Measurements L x W x D (cm) 6.362 4.241 N/A A (cm) : rea 10.815 6.362 N/A Volume (cm) : 35.20% -111.70% N/A % Reduction in A rea: 26.60% -1486.50% N/A % Reduction in Volume: Category/Stage IV Category/Stage III N/A Classification: Large Large N/A Exudate A mount: Serosanguineous Serosanguineous N/A Exudate Type: red, brown red, brown N/A Exudate Color: Large (67-100%) Medium (34-66%) N/A Granulation A mount: Red Red N/A Granulation Quality: Small (1-33%) Medium (34-66%) N/A Necrotic A mount: Fat Layer (Subcutaneous Tissue): Yes Fat Layer (Subcutaneous Tissue): Yes N/A Exposed Structures: Fascia: No Fascia: No Tendon: No Tendon: No [name], [name] ([id number]) [id number] Page 5 of 10 Muscle: No Muscle: No Joint: No Joint: No Bone: No Bone: No N/A None N/A Epithelialization: Treatment Notes Electronic Signature(s) Signed: 04/28/2023 8:19:02 AM By: [name] RN Entered By: [name] on 04/28/2023 08:19:02 -------------------------------------------------------------------------------- Multi-Disciplinary Care Plan Details Patient Name: Date of Service: [name] TH, [name] 04/28/2023 8:00 A [name] Medical Record Number: [id number] Patient Account Number:  [id number] Date of Birth/Sex: Treating RN: 26-Jul-1966 (56 y.o. [name]) [name] Primary Care Sunaina Ferrando: Aram Beecham Other Clinician: Referring Amonte Brookover: Treating Faaris Arizpe/Extender: RO BSO N, MICHA EL Cathlean Sauer in Treatment: 4 Active Inactive Pressure Nursing Diagnoses: Potential for impaired tissue integrity related to pressure, friction, moisture, and shear Goals: Patient will remain free from development of additional pressure ulcers Date Initiated: 03/31/2023 Target Resolution Date: 04/30/2023 Goal Status: Active Interventions: Assess: immobility, friction, shearing, incontinence upon admission and as needed Assess offloading mechanisms upon admission and as needed Assess potential for pressure ulcer upon admission and as needed Notes: Wound/Skin Impairment Nursing Diagnoses: Knowledge deficit related to ulceration/compromised skin integrity Goals: Patient/caregiver will verbalize understanding of skin care regimen Date Initiated: 03/31/2023 Target Resolution Date: 04/30/2023 Goal Status: Active Ulcer/skin breakdown will have a volume reduction of 30% by week 4 Date Initiated: 03/31/2023 Target Resolution Date: 05/31/2023 Goal Status: Active Ulcer/skin breakdown will have a volume reduction of 50% by week 8 Date Initiated: 03/31/2023 Target Resolution Date: 07/01/2023 Goal Status: Active Ulcer/skin breakdown will have a volume reduction of 80% by week 12 Date Initiated: 03/31/2023 Target Resolution Date: 07/31/2023 Goal Status: Active Ulcer/skin breakdown will heal within 14 weeks Date Initiated: 03/31/2023 Target Resolution Date: 08/31/2023 XERXES, AGRUSA ([id number]) [id number] Page 6 of 10 Goal Status: Active Interventions: Assess patient/caregiver ability to obtain necessary supplies Assess patient/caregiver ability to perform ulcer/skin care regimen upon admission and as needed Assess ulceration(s) every  visit Notes: Electronic Signature(s) Signed: 04/28/2023 8:19:15 AM By: [name] RN Entered By: [name] on 04/28/2023 08:19:14 -------------------------------------------------------------------------------- Pain Assessment Details Patient Name: Date of Service: [name] [facility], [name] 04/28/2023 8:00 A [name] Medical Record Number: [id number] Patient Account Number: [id number] Date of Birth/Sex: Treating RN: 10-30-1965 (56 y.o. [name] Primary Care Jaquisha Frech: Aram Beecham Other Clinician: Referring Sabah Zucco: Treating Rocket Gunderson/Extender: RO BSO N, MICHA EL Cathlean Sauer in Treatment: 4 Active Problems Location of Pain Severity and Description of Pain Patient Has Paino No Site Locations Pain Management and Medication Current Pain Management: Electronic Signature(s) Signed: 04/28/2023 8:16:28 AM By: [name] RN Entered By: [name] on 04/28/2023 64:33:29 [name] ([id number]) [id number] Page 7 of 10 -------------------------------------------------------------------------------- Patient/Caregiver Education Details Patient Name: Date of Service: [name] [facility], [name] 7/25/2024andnbsp8:00 A [name] Medical Record Number: [id number] Patient Account Number: [id number] Date of Birth/Gender: Treating RN: [birth year] (57 y.o. [name]) Jettie Pagan, Lyla Son  Primary Care Physician: Aram Beecham Other Clinician: Referring Physician: Treating Physician/Extender: Chauncey Mann, MICHA EL Cathlean Sauer in Treatment: 4 Education Assessment Education Provided To: Patient Education Topics Provided Pressure: Handouts: Pressure Injury: Prevention and Offloading Methods: Explain/Verbal Responses: State content correctly Electronic Signature(s) Signed: 04/29/2023 7:58:02 AM By: [name] RN Entered By: [name] on 04/28/2023 08:19:34 -------------------------------------------------------------------------------- Wound Assessment  Details Patient Name: Date of Service: [name] TH, [name] 04/28/2023 8:00 A [name] Medical Record Number: [id number] Patient Account Number: [id number] Date of Birth/Sex: Treating RN: [birth year] (56 y.o. [name]) [name] Primary Care Everley Evora: Aram Beecham Other Clinician: Referring Aaron Bostwick: Treating Marigrace Mccole/Extender: RO BSO N, MICHA EL Cathlean Sauer in Treatment: 4 Wound Status Wound Number: 5 Primary Pressure Ulcer Etiology: Wound Location: Right, Midline Gluteus Wound Open Wounding Event: Gradually Appeared Status: Date Acquired: 10/04/2021 Comorbid Cataracts, Glaucoma, Coronary Artery Disease, Hypertension, Weeks Of Treatment: 4 History: History of pressure wounds, Osteoarthritis, Paraplegia Clustered Wound: No Photos [place], Chase Hill ([id number]) [id number] Page 8 of 10 Wound Measurements Length: (cm) 5.7 Width: (cm) 3 Depth: (cm) 1.7 Area: (cm) 13.43 Volume: (cm) 22.832 % Reduction in Area: -36.8% % Reduction in Volume: -55% Tunneling: No Undermining: No Wound Description Classification: Category/Stage IV Exudate Amount: Large Exudate Type: Serosanguineous Exudate Color: red, brown Foul Odor After Cleansing: No Slough/Fibrino Yes Wound Bed Granulation Amount: Large (67-100%) Exposed Structure Granulation Quality: Red Fascia Exposed: No Necrotic Amount: Small (1-33%) Fat Layer (Subcutaneous Tissue) Exposed: Yes Tendon Exposed: No Muscle Exposed: Yes Necrosis of Muscle: No Joint Exposed: No Bone Exposed: Yes Treatment Notes Wound #5 (Gluteus) Wound Laterality: Right, Midline Cleanser Byram Ancillary Kit - 15 Day Supply Discharge Instruction: Use supplies as instructed; Kit contains: (15) Saline Bullets; (15) 3x3 Gauze; 15 pr Gloves Peri-Wound Care Topical Primary Dressing Hydrofera Blue Ready Transfer Foam, 4x5 (in/in) Discharge Instruction: Apply Hydrofera Blue Ready to wound bed as directed Secondary  Dressing (BORDER) Zetuvit Plus SILICONE BORDER Dressing 4x4 (in/in) Discharge Instruction: Please do not put silicone bordered dressings under wraps. Use non-bordered dressing only. Secured With Compression Wrap Compression Stockings Facilities manager) Signed: 04/28/2023 8:40:08 AM By: [name] RN Previous Signature: 04/28/2023 8:18:28 AM Version By: [name] RN Entered By: [name] on 04/28/2023 08:40:08 [name] ([id number]) [id number] Page 9 of 10 -------------------------------------------------------------------------------- Wound Assessment Details Patient Name: Date of Service: [name] [facility], [name] 04/28/2023 8:00 A [name] Medical Record Number: [id number] Patient Account Number: [id number] Date of Birth/Sex: Treating RN: 04-15-1966 (56 y.o. [name]) [name] Primary Care Dondi Burandt: Aram Beecham Other Clinician: Referring Osias Resnick: Treating Tonny Isensee/Extender: RO BSO N, MICHA EL Charna Archer, Jeffrey Weeks in Treatment: 4 Wound Status Wound Number: 6 Primary Pressure Ulcer Etiology: Wound Location: Right, Lateral Gluteus Wound Open Wounding Event: Gradually Appeared Status: Date Acquired: 10/04/2021 Comorbid Cataracts, Glaucoma, Coronary Artery Disease, Hypertension, Weeks Of Treatment: 4 History: History of pressure wounds, Osteoarthritis, Paraplegia Clustered Wound: No Photos Wound Measurements Length: (cm) 2.7 Width: (cm) 2 Depth: (cm) 1.5 Area: (cm) 4.241 Volume: (cm) 6.362 % Reduction in Area: -111.7% % Reduction in Volume: -1486.5% Epithelialization: None Tunneling: No Undermining: No Wound Description Classification: Category/Stage III Exudate Amount: Large Exudate Type: Serosanguineous Exudate Color: red, brown Foul Odor After Cleansing: No Slough/Fibrino Yes Wound Bed Granulation Amount: Medium (34-66%) Exposed Structure Granulation Quality: Red Fascia Exposed: No Necrotic Amount: Medium  (34-66%) Fat Layer (Subcutaneous Tissue) Exposed: Yes Necrotic Quality: Adherent Slough Tendon Exposed: No Muscle Exposed: No Joint Exposed: No Bone Exposed: No Treatment  Notes Wound #6 (Gluteus) Wound Laterality: Right, Lateral Cleanser Byram Ancillary Kit - 15 Day Supply Discharge Instruction: Use supplies as instructed; Kit contains: (15) Saline Bullets; (15) 3x3 Gauze; 15 pr Gloves Peri-Wound Care [place], Chase Hill ([id number]) [id number] Page 10 of 10 Topical Primary Dressing Hydrofera Blue Ready Transfer Foam, 4x5 (in/in) Discharge Instruction: Apply Hydrofera Blue Ready to wound bed as directed Secondary Dressing (BORDER) Zetuvit Plus SILICONE BORDER Dressing 4x4 (in/in) Discharge Instruction: Please do not put silicone bordered dressings under wraps. Use non-bordered dressing only. Secured With Compression Wrap Compression Stockings Add-Ons Electronic Signature(s) Signed: 04/28/2023 8:18:48 AM By: [name] RN Entered By: [name] on 04/28/2023 08:18:48 -------------------------------------------------------------------------------- Vitals Details Patient Name: Date of Service: [name] TH, [name] 04/28/2023 8:00 A [name] Medical Record Number: [id number] Patient Account Number: [id number] Date of Birth/Sex: Treating RN: 1966-06-05 (56 y.o. [name]) [name] Primary Care Hideko Esselman: Aram Beecham Other Clinician: Referring Vernell Townley: Treating Izear Pine/Extender: RO BSO N, MICHA EL Cathlean Sauer in Treatment: 4 Vital Signs Time Taken: 08:04 Temperature (F): 97.8 Height (in): 59 Pulse (bpm): 88 Weight (lbs): 90 Respiratory Rate (breaths/min): 18 Body Mass Index (BMI): 18.2 Blood Pressure (mmHg): 98/62 Reference Range: 80 - 120 mg / dl Electronic Signature(s) Signed: 04/28/2023 8:16:21 AM By: [name] RN Entered By: [name] on 04/28/2023 08:16:21

## 2023-04-29 NOTE — Progress Notes (Signed)
[place], [name] ([id number]) [id number] Page 1 of 9 Visit Report for 04/28/2023 HPI Details Patient Name: Date of Service: [name] [facility], [name] 04/28/2023 8:00 A M Medical Record Number: [id number] Patient Account Number: [id number] Date of Birth/Sex: Treating RN: 04-16-1966 (56 y.o. Chase Petit) [name] Primary Care Provider: Aram Beecham Other Clinician: Referring Provider: Treating Provider/Extender: RO BSO N, MICHA EL Cathlean Sauer in Treatment: 4 History of Present Illness HPI Description: 57 year old male with a history of spina bifida presenting to [place] with a history of a open wound on the left gluteal region near his upper thigh which she's had for several weeks. He was seen in the ED recently at [facility] and was put on doxycycline and was advised some local care. his past medical history is significant for spinal bifid, neurogenic bladder, kidney stones, sacral pressure sores, constipation. Past surgical history significant for partial cystectomy, ileal conduit, VP shunt removal, percutaneous nephro lithotripsy. In the remote past the patient says he's had some treatment for a ulcer on this area and was treated with a skin graft. Readmission: 10/16/2021 upon evaluation today patient appears to be doing somewhat poorly in regard to a fairly large wound noted over the right ischial tuberosity location. This is a stage IV pressure ulcer. Of note this is also positive for osteomyelitis upon a CT scan which was performed during the time that he was admitted in the hospital on 09/19/2021. With that being said it is noted in the right inferior buttock that he has a decubitus ulcer which extends to the posterior toe inferior right ischium where there is evidence of osteomyelitis. When he was here in the office actually missed that this was positive I missed read it and thought it said that there was no osteomyelitis. That is not the case there  is in fact osteomyelitis noted here. I actually did review his notes as well in epic. Looking to the discharge summary it appears that the patient was admitted from 09/19/2021 through 09/23/2021. He was discharged home with home health care according to the note. With that being said from what I understand his sister actually takes care of him at this point. He was also to follow-up with a primary care provider and here at the wound care center within a week. I am just now seeing him on the 13th almost a month after discharge. He does have a history of again osteomyelitis of this right hip/ischial tuberosity location. 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 The reason for his admission was actually worsening of the decubitus ulcer upon admission. He does have chronic osteomyelitis of this area. He was treated with empiric antibiotics he had acute kidney injury which improved with IV fluids he also had hypokalemia. Surgical debridement was performed by general surgery at that time and ID was consulted to assist with management according to the notes. IV antibiotics were recommended but patient declined and wanted oral antibiotics at discharge. Therefore no IV antibiotics were planned for discharge time. This case was under the care of Dr. Joylene Draft while he was in the hospital when she did discharge him with a 2-week course of doxycycline and Augmentin according to the notes. It looks like Dr. Judithann Sheen office did call and leave a message for the patient to get scheduled for a follow-up visit  after hospital discharge I do not see where he showed up in fact it appears that the patient has a no-show letter from Dr. Judithann Sheen office noted in epic due to a appointment that was missed on 10/06/2021. Looking at the pictures from Dr. Cristine Polio notes on 09/23/2021  it does appear that the wound is a little bit better as far as the cleanliness of the surface of the wound today but nonetheless still is a very significant wound. 10/26/2021 upon evaluation today patient appears to be doing okay in regard to his wound. Again this is a wound that he did indeed have osteomyelitis and previous. With that being said I do not see any signs of a worsening infection at this time which is good news. Overall I think that we are headed in the right direction although still I think he needs more aggressive offloading. 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 I do believe this would be ideal for him to be honest. Readmission: 01-15-2022 upon evaluation today patient presents for reevaluation he was last seen on October 26, 2021. At that time unfortunately he was having issues with a significant ischial pressure ulcer on the right ischial tuberosity. Subsequently he ended up having decent response to the Dakin's moistened gauze dressing that was previously being utilized and subsequently the patient's sister who is his primary caregiver as well states that she decided just to try to manage this at home. Nonetheless unfortunately this is getting a little larger not better. He does spend a lot of the day in his chair. He does have an air mattress according what they tell me today. That was previously ordered. With that being said I do believe that at this time things do appear to be doing much better from the standpoint of infection I do not see any signs of overt infection. There is also no evidence of systemic infection. No fevers, chills, nausea, vomiting, or diarrhea. With that being said I do believe based on what I am seeing the wound is a bit cleaner and he possibly could be an excellent candidate for a wound VAC. 02-05-2022 upon evaluation today patient appears to be doing about the same in regard to his wound. Unfortunately he is not any better but he  also really has not had the wound VAC on sufficiently. There is been some confusion with the orders part of that I think is our follow-up with the way the order was written part of it may be with how the wound VAC is being placed either way we need to see what we can do to try to improve things in this regard. We will clarify the orders today going forward for home health. Unfortunately the patient does have a new wound today which is on the opposite side. This is not good 1 was bad enough have another area to open is definitely not a good thing. Its not as deep but nonetheless is also not very shallow either. We may potentially be able to get this to the point that we could bridge the 2 together in order to wound VAC both but right now want a focus on to try to get the wound VAC going for the main wound initially we will likely use Hydrofera Blue on the new wound.Marland Kitchen 02-19-2022 upon evaluation today patient's wound appears to be doing a little bit worse in the way of maceration fortunately there is no signs of active infection locally or systemically which  is great news. No fevers, chills, nausea, vomiting, or diarrhea. [place], [name] ([id number]) [id number] Page 2 of 9 03-05-2022 upon evaluation today patient unfortunately does not appear to be doing nearly as good in regard to his wounds as I would like to see. Fortunately I do not see any evidence of active infection that is obvious although I am can obtain a wound culture to ensure that there is not anything getting worse here as far as the wound without the wound VAC on his concern. With that being said I will go ahead and get this sent in and evaluated will initiate treatment as needed going forward if necessary. With regard to the wound with the wound VAC this continues to be placed directly over the wound and there is obvious signs of deep tissue injury. The suction being here means that he is sitting on it I think  this may be part of the reason why it is coming off although not 100% certain. 03-12-2022 upon evaluation today patient still has not gotten the antibiotics that were called in for him 4 days ago. He tells me that his sister just told him this morning that they were ready but that she has not had a chance to go pick them up. Nonetheless I feel like that this is really something he needs any should have been taken that not the other antibiotics which were not doing the job for him to be honest. The patient states and I completely agree that there is really no way he would have known relying on his sister to let him know obviously. Nonetheless I do believe that he needs to not be taking any other antibiotics and be on the Levaquin as soon as possible. He tells me that she said she was going to pick them up today. In regard to home health I am hopeful that they actually have been bridging this now. I do think that the wound looks better and this is good news. With that being said I am not certain that I can really tell for sure how this was attached it was removed before he came in and the black foam left in place. I really want him to leave the wound VAC in place until he comes in so that we can monitor and make sure that this is being done correctly he voiced understanding. 03-19-2022 upon evaluation today and much happier with how things appear as far as the patient's wounds are concerned. I think he is on a much better track and they are doing a better job at bridging although its not quite where I like to see it is still more posterior which I think the patient needs to be a little bit more lateral on his side. Either way this is something that I did marked with a small dot today for him to tell the home health nurses well so she can bridge it to that location also think the tubing should go up instead of going down to just make it less cumbersome overall for him. 04-19-2022 upon evaluation today patient  appears to be doing a little better in regard to his wounds. Fortunately I do not see any signs of infection I do believe he is on the right track. The wound VAC does seem to be helping to clean the wound up a bit and bring it in from the base at work. This is good news. 05-10-2022 upon evaluation today patient appears to be doing well currently in regard  to his wounds in general in fact of the smaller of the 2 wounds without the wound VAC actually appears to be doing so much better this is almost completely closed and very pleased in that regard. He has been staying off of this he tells me and shows. With that being said the wound with the wound VAC is going require some sharp debridement there is some tissue which is not quite as healthy and we are going to go ahead and continue that for probably 2 more weeks although I think we may be closer to discontinuing this as well since it has healed and quite significantly. 05-24-2022 upon evaluation today patient appears to be doing well with regard to his wound in fact this looks better than I have been expected. This actually I think is a good time to go ahead and switch away from the wound VAC based on what I am seeing. I think we go to [facility] with a bordered foam dressing. 06-21-2022 upon evaluation today patient appears to be doing well currently in regard to his wound this is measuring smaller and looks well. Fortunately there does not appear to be any signs of active infection locally or systemically at this time which is great news. No fevers, chills, nausea, vomiting, or diarrhea. 07-29-2022 upon evaluation today patient appears to be doing well currently in regard to his wound. This is actually measuring smaller although there is a lot of hypergranulation noted. Fortunately there does not appear to be any signs of active infection locally or systemically at this time. No fevers, chills, nausea, vomiting, or diarrhea. 11/9; pressure ulcer on  the right buttock in the setting of spina bifida and lower extremity paralysis. He has been using [employer] 09-02-2022 upon evaluation today patient appears to be doing well currently in regard to his wound. We have been using Hydrofera Blue this is showing signs of improvement as far as getting smaller is concerned. There is some drainage around the edges of the wound but I think a lot of this is due to the fact he did not even have a dressing on that he came in today. 12/29; right buttock pressure area which was initially stage IV. This is in the setting of a patient with spina bifida spends most of his time in a wheelchair. I am not certain how much he is offloading this. He has been using for [facility] as a primary dressing. When he was here the last visit he was felt to have hyper granulated tissue and was treated with silver nitrate chemical cauterization 10-14-2022 upon evaluation today patient appears to be doing poorly in regard to the wound in the right ischial location. Unfortunately he tells me that the dressing seems to be falling off frequently. Unfortunately I think that this has contributed to him developing an infection which appears to be exemplified by the fact that he has bone exposed which is actually necrotic and working its way out of the wound up and have to perform a fairly significant debridement today it does appear. I think that he needs to have these dressings ensure that they are in place at all times. Also discussed with the patient that we will get a need to do some testing in order to see if he indeed has an infection. If he does have a bone infection which I think is pretty likely to be osteomyelitis of this ischial location. I discussed that with the patient in detail today and depending  on what we see him and make any adjustments in care as far as going forward is concerned. Obviously depressed antibiotic that we gave him that he tells me was somewhat  irritating as far as the stomach side effects are concerned. He states he really would not want to be on that again. Either way I feel he is probably going to be placed on something once we get the results of the culture back. I am honestly concerned about the fact that he comes into the clinic without any dressings in place I am not sure how often they are really staying where they need to be as far as this wound is concerned. I actually cannot remember seeing him anytime in the past 2 months or so at least that he came in with a dressing intact. 10-21-2022 upon evaluation today patient presents for initial inspection here in our clinic concerning issues that he has been having with a wound in the sacral area. Again last time he was here I did debride away necrotic bone which was confirmed on pathology although it was not necessarily confirmed that this was secondary to osteomyelitis on the pathology report. Also the x-ray did not neither confirm nor deny the presence of osteomyelitis and his culture unfortunately showed multiple organisms with nothing predominance of this does not help to rule and tailor down exactly what is going on here. Fortunately I do not see any signs of active infection systemically which is great news. With that being said locally I am definitely seeing some issues here. In fact this is warm to touch around the sacral area and I think that cellulitis is definitely present I just cannot pinpoint the exact organism. With that being said I did discuss with the patient as well today that I do believe that he probably needs an MRI in order to further quantify the extent of her likely osteomyelitis and this again is something that we are going to order this will need to be without contrast due to the fact that he is allergic to contrast dye. 11-18-2022 upon evaluation today patient's wound is actually showing some signs of improvement currently. We have been doing the Dakin's  moistened gauze dressing which I think is helping to clean up the wound for seeing some granulation growing and still the tissue is not quite as healthy as I would love to see but better than previous. He does have osteomyelitis but the good news is he seems to be treated well with the antibiotics he does have a refill that was just picked up and I think that he needs to continue to take that for certain. 3/7; patient is on a 6-week clinic hiatus. He lives at home with his sister however she is not at home all the time. He transfers out of bed and a sliding board. He claims he is only in the in the wheelchair about 4 5 hours. He does have underlying osteomyelitis I am not sure about the status of the antibiotics here. Part of the wound still easily probes to bone 12-23-2022 upon evaluation today patient appears to be doing well currently in regard to his wound all things considered there is still a lot of hypergranulation but I do feel like that with the Vashe moistened gauze is actually doing better than he was previously with the Hydrofera Blue. This just did not seem to be staying in place and with the Vashe through able to change this more frequently. Readmission: 03-31-2023 this is  a gentleman whom I have seen multiple times intermittently throughout the years compliance has been a real issue however. I do feel like there is some significant issues here still with the fact that dressing changes and keeping dressings in place are still pretty much as a significant problem here for [place]. I do not see any signs of active infection at this time which is great news but at the same time the wounds do not worse in fact he has another wound different than what I even noted previous. Subsequently he I do believe that the patient should continue to do monitor for any evidence of infection or worsening. Overall I think he needs to have regular follow-up visits with [place] and I think that he needs to have dressings  in place at all times. He tells me that he is at home primarily by himself transferring himself from the first thing in the morning through at least 3 in the afternoon as his sister works he really needs [name], [name] ([id number]) [id number] Page 3 of 9 somebody gets a caregiver with him throughout the day. 04-12-2023 upon evaluation today patient appears to be doing a little worse currently in regard to his wounds. He has been tolerating the dressing changes without complication. Fortunately I do not see any evidence of active infection locally nor systemically at this point. 04-21-2023 upon evaluation today patient's wound unfortunately appears to have purulent drainage and also there is a malodorous drainage at this point. Unfortunately I think that there is something going on here that does not seem to be doing nearly as good as what we would like to see. I think that he is likely going to require some antibiotics. He has taken Cipro before without complication. 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 On 7/18 he had purulent drainage he had a PCR culture and is now on Cipro he is tolerating this well. Using Hydrofera Blue's a 2 bit and border foam his sister is changing the dressing After his sister goes to work the patient is apparently in a wheelchair all day. There is no way to heal the wound like this with that degree of prolonged pressure. Electronic Signature(s) Signed: 04/29/2023 7:34:23 AM By: Baltazar Najjar MD Entered By: Baltazar Najjar on 04/28/2023 08:56:06 -------------------------------------------------------------------------------- Physical Exam Details Patient Name: Date of Service: [name] TH, [name] 04/28/2023 8:00 A M Medical Record Number: [id number] Patient Account Number: [id number] Date of Birth/Sex: Treating RN: 01/16/1966 (56 y.o. [name] Primary Care Provider: Aram Beecham Other  Clinician: Referring Provider: Treating Provider/Extender: RO BSO N, MICHA EL Cathlean Sauer in Treatment: 4 Constitutional Patient is hypertensive.. Pulse regular and within target range for patient.Marland Kitchen Respirations regular, non-labored and within target range.. Temperature is normal and within the target range for the patient.Marland Kitchen appears in no distress. Notes Wound exam; this patient has a stage IV pressure area with an area of exposed bone. There is no purulent drainage and no odor this week which is an improvement on antibiotics.no erythema no pur Electronic Signature(s) Signed: 04/29/2023 7:34:23 AM By: Baltazar Najjar MD Entered By: Baltazar Najjar on 04/28/2023 08:57:40 -------------------------------------------------------------------------------- Physician Orders Details Patient Name: Date of Service: [name] TH, [name] 04/28/2023 8:00 A M Medical Record Number: [id number] Patient Account Number: [id number] Date of Birth/Sex: Treating RN: 1966/08/08 (56 y.o. [name] Primary Care Provider: Aram Beecham Other Clinician: Referring Provider: Treating Provider/Extender: RO BSO  N, MICHA EL Tawanna Solo Weeks in Treatment: 4 Verbal / Phone Orders: No [place], [name] ([id number]) [id number] Page 4 of 9 Diagnosis Coding Follow-up Appointments Return Appointment in 1 week. Bathing/ [employer] wounds with antibacterial soap and water. Anesthetic (Use 'Patient Medications' Section for Anesthetic Order Entry) Lidocaine applied to wound bed Wound Treatment Wound #5 - Gluteus Wound Laterality: Right, Midline Cleanser: Byram Ancillary Kit - 15 Day Supply (Generic) 3 x Per Week/30 Days Discharge Instructions: Use supplies as instructed; Kit contains: (15) Saline Bullets; (15) 3x3 Gauze; 15 pr Gloves Prim Dressing: Hydrofera Blue Ready Transfer Foam, 4x5 (in/in) (Generic) 3 x Per Week/30 Days ary Discharge Instructions:  Apply Hydrofera Blue Ready to wound bed as directed Secondary Dressing: (BORDER) Zetuvit Plus SILICONE BORDER Dressing 4x4 (in/in) (Generic) 3 x Per Week/30 Days Discharge Instructions: Please do not put silicone bordered dressings under wraps. Use non-bordered dressing only. Wound #6 - Gluteus Wound Laterality: Right, Lateral Cleanser: [employer]ram Ancillary Kit - 15 Day Supply (Generic) 3 x Per Week/30 Days Discharge Instructions: Use supplies as instructed; Kit contains: (15) Saline Bullets; (15) 3x3 Gauze; 15 pr Gloves Prim Dressing: Hydrofera Blue Ready Transfer Foam, 4x5 (in/in) (Generic) 3 x Per Week/30 Days ary Discharge Instructions: Apply Hydrofera Blue Ready to wound bed as directed Secondary Dressing: (BORDER) Zetuvit Plus SILICONE BORDER Dressing 4x4 (in/in) (Generic) 3 x Per Week/30 Days Discharge Instructions: Please do not put silicone bordered dressings under wraps. Use non-bordered dressing only. Electronic Signature(s) Signed: 04/28/2023 8:21:05 AM By: [name] RN Signed: 04/29/2023 7:34:23 AM By: Baltazar Najjar MD Entered By: [name] on 04/28/2023 08:21:05 -------------------------------------------------------------------------------- Problem List Details Patient Name: Date of Service: [name] TH, [name] 04/28/2023 8:00 A M Medical Record Number: [id number] Patient Account Number: [id number] Date of Birth/Sex: Treating RN: 09/15/1966 (56 y.o. [name] Primary Care Provider: Aram Beecham Other Clinician: Referring Provider: Treating Provider/Extender: RO BSO N, MICHA EL Cathlean Sauer in Treatment: 4 Active Problems ICD-10 Encounter Code Description Active Date MDM Diagnosis L89.314 Pressure ulcer of right buttock, stage 4 03/31/2023 No Yes Q76.0 Spina bifida occulta 03/31/2023 No Yes [name], [name] ([id number]) [id number] Page 5 of 9 Z93.3 Colostomy status 03/31/2023 No Yes I10 Essential (primary) hypertension  03/31/2023 No Yes I25.10 Atherosclerotic heart disease of native coronary artery without angina pectoris 03/31/2023 No Yes Inactive Problems Resolved Problems Electronic Signature(s) Signed: 04/29/2023 7:34:23 AM By: Baltazar Najjar MD Entered By: Baltazar Najjar on 04/28/2023 08:51:59 -------------------------------------------------------------------------------- Progress Note Details Patient Name: Date of Service: [name] TH, [name] 04/28/2023 8:00 A M Medical Record Number: [id number] Patient Account Number: [id number] Date of Birth/Sex: Treating RN: 27-Apr-1966 (56 y.o. Chase Petit) [name] Primary Care Provider: Aram Beecham Other Clinician: Referring Provider: Treating Provider/Extender: RO BSO N, MICHA EL Cathlean Sauer in Treatment: 4 Subjective History of Present Illness (HPI) 57 year old male with a history of spina bifida presenting to [place] with a history of a open wound on the left gluteal region near his upper thigh which she's had for several weeks. He was seen in the ED recently at [facility] and was put on doxycycline and was advised some local care. his past medical history is significant for spinal bifid, neurogenic bladder, kidney stones, sacral pressure sores, constipation. Past surgical history significant for partial cystectomy, ileal conduit, VP shunt removal, percutaneous nephro lithotripsy. In the remote past the patient says he's had some treatment for a ulcer on this area and was treated with a  skin graft. Readmission: 10/16/2021 upon evaluation today patient appears to be doing somewhat poorly in regard to a fairly large wound noted over the right ischial tuberosity location. This is a stage IV pressure ulcer. Of note this is also positive for osteomyelitis upon a CT scan which was performed during the time that he was admitted in the hospital on 09/19/2021. With that being said it is noted in the right inferior buttock that he has a decubitus  ulcer which extends to the posterior toe inferior right ischium where there is evidence of osteomyelitis. When he was here in the office actually missed that this was positive I missed read it and thought it said that there was no osteomyelitis. That is not the case there is in fact osteomyelitis noted here. I actually did review his notes as well in epic. Looking to the discharge summary it appears that the patient was admitted from 09/19/2021 through 09/23/2021. He was discharged home with home health care according to the note. With that being said from what I understand his sister actually takes care of him at this point. He was also to follow-up with a primary care provider and here at the wound care center within a week. I am just now seeing him on the 13th almost a month after discharge. He does have a history of again osteomyelitis of this right hip/ischial tuberosity location. 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 The reason for his admission was actually worsening of the decubitus ulcer upon admission. He does have chronic osteomyelitis of this area. He was treated with empiric antibiotics he had acute kidney injury which improved with IV fluids he also had hypokalemia. Surgical debridement was performed by general surgery at that time and ID was consulted to assist with management according to the notes. IV antibiotics were recommended but patient declined and wanted oral antibiotics at discharge. Therefore no IV antibiotics were planned for discharge time. This case was under the care of Dr. Joylene Draft while he was in the hospital when she did discharge him with a 2-week course of doxycycline and Augmentin according to the notes. It looks like Dr. Judithann Sheen office did call and leave a message for the patient to get scheduled for a follow-up  visit after hospital discharge I do not see where he showed up in fact it appears that the patient has a no-show letter from Dr. Judithann Sheen office noted in epic due to a appointment that was missed on 10/06/2021. Looking at the pictures from Dr. Cristine Polio notes on 09/23/2021 it does appear that the wound is a little bit better as far as the cleanliness of the surface of the wound today but nonetheless still is a very significant wound. 10/26/2021 upon evaluation today patient appears to be doing okay in regard to his wound. Again this is a wound that he did indeed have osteomyelitis and previous. With that being said I do not see any signs of a worsening infection at this time which is good news. Overall I think that we are headed in the right direction although still I think he needs more aggressive offloading. 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 I do believe this would be ideal for him to be honest. [name], [name] ([id number]) [id number] Page 6 of 9 Readmission: 01-15-2022 upon evaluation  today patient presents for reevaluation he was last seen on October 26, 2021. At that time unfortunately he was having issues with a significant ischial pressure ulcer on the right ischial tuberosity. Subsequently he ended up having decent response to the Dakin's moistened gauze dressing that was previously being utilized and subsequently the patient's sister who is his primary caregiver as well states that she decided just to try to manage this at home. Nonetheless unfortunately this is getting a little larger not better. He does spend a lot of the day in his chair. He does have an air mattress according what they tell me today. That was previously ordered. With that being said I do believe that at this time things do appear to be doing much better from the standpoint of infection I do not see any signs of overt infection. There is also no evidence of  systemic infection. No fevers, chills, nausea, vomiting, or diarrhea. With that being said I do believe based on what I am seeing the wound is a bit cleaner and he possibly could be an excellent candidate for a wound VAC. 02-05-2022 upon evaluation today patient appears to be doing about the same in regard to his wound. Unfortunately he is not any better but he also really has not had the wound VAC on sufficiently. There is been some confusion with the orders part of that I think is our follow-up with the way the order was written part of it may be with how the wound VAC is being placed either way we need to see what we can do to try to improve things in this regard. We will clarify the orders today going forward for home health. Unfortunately the patient does have a new wound today which is on the opposite side. This is not good 1 was bad enough have another area to open is definitely not a good thing. Its not as deep but nonetheless is also not very shallow either. We may potentially be able to get this to the point that we could bridge the 2 together in order to wound VAC both but right now want a focus on to try to get the wound VAC going for the main wound initially we will likely use Hydrofera Blue on the new wound.Marland Kitchen 02-19-2022 upon evaluation today patient's wound appears to be doing a little bit worse in the way of maceration fortunately there is no signs of active infection locally or systemically which is great news. No fevers, chills, nausea, vomiting, or diarrhea. 03-05-2022 upon evaluation today patient unfortunately does not appear to be doing nearly as good in regard to his wounds as I would like to see. Fortunately I do not see any evidence of active infection that is obvious although I am can obtain a wound culture to ensure that there is not anything getting worse here as far as the wound without the wound VAC on his concern. With that being said I will go ahead and get this sent in and  evaluated will initiate treatment as needed going forward if necessary. With regard to the wound with the wound VAC this continues to be placed directly over the wound and there is obvious signs of deep tissue injury. The suction being here means that he is sitting on it I think this may be part of the reason why it is coming off although not 100% certain. 03-12-2022 upon evaluation today patient still has not gotten the antibiotics that were called in for him 4  days ago. He tells me that his sister just told him this morning that they were ready but that she has not had a chance to go pick them up. Nonetheless I feel like that this is really something he needs any should have been taken that not the other antibiotics which were not doing the job for him to be honest. The patient states and I completely agree that there is really no way he would have known relying on his sister to let him know obviously. Nonetheless I do believe that he needs to not be taking any other antibiotics and be on the Levaquin as soon as possible. He tells me that she said she was going to pick them up today. In regard to home health I am hopeful that they actually have been bridging this now. I do think that the wound looks better and this is good news. With that being said I am not certain that I can really tell for sure how this was attached it was removed before he came in and the black foam left in place. I really want him to leave the wound VAC in place until he comes in so that we can monitor and make sure that this is being done correctly he voiced understanding. 03-19-2022 upon evaluation today and much happier with how things appear as far as the patient's wounds are concerned. I think he is on a much better track and they are doing a better job at bridging although its not quite where I like to see it is still more posterior which I think the patient needs to be a little bit more lateral on his side. Either way this is  something that I did marked with a small dot today for him to tell the home health nurses well so she can bridge it to that location also think the tubing should go up instead of going down to just make it less cumbersome overall for him. 04-19-2022 upon evaluation today patient appears to be doing a little better in regard to his wounds. Fortunately I do not see any signs of infection I do believe he is on the right track. The wound VAC does seem to be helping to clean the wound up a bit and bring it in from the base at work. This is good news. 05-10-2022 upon evaluation today patient appears to be doing well currently in regard to his wounds in general in fact of the smaller of the 2 wounds without the wound VAC actually appears to be doing so much better this is almost completely closed and very pleased in that regard. He has been staying off of this he tells me and shows. With that being said the wound with the wound VAC is going require some sharp debridement there is some tissue which is not quite as healthy and we are going to go ahead and continue that for probably 2 more weeks although I think we may be closer to discontinuing this as well since it has healed and quite significantly. 05-24-2022 upon evaluation today patient appears to be doing well with regard to his wound in fact this looks better than I have been expected. This actually I think is a good time to go ahead and switch away from the wound VAC based on what I am seeing. I think we go to [facility] with a bordered foam dressing. 06-21-2022 upon evaluation today patient appears to be doing well currently in regard to his wound  this is measuring smaller and looks well. Fortunately there does not appear to be any signs of active infection locally or systemically at this time which is great news. No fevers, chills, nausea, vomiting, or diarrhea. 07-29-2022 upon evaluation today patient appears to be doing well currently in regard to  his wound. This is actually measuring smaller although there is a lot of hypergranulation noted. Fortunately there does not appear to be any signs of active infection locally or systemically at this time. No fevers, chills, nausea, vomiting, or diarrhea. 11/9; pressure ulcer on the right buttock in the setting of spina bifida and lower extremity paralysis. He has been using [facility] 09-02-2022 upon evaluation today patient appears to be doing well currently in regard to his wound. We have been using Hydrofera Blue this is showing signs of improvement as far as getting smaller is concerned. There is some drainage around the edges of the wound but I think a lot of this is due to the fact he did not even have a dressing on that he came in today. 12/29; right buttock pressure area which was initially stage IV. This is in the setting of a patient with spina bifida spends most of his time in a wheelchair. I am not certain how much he is offloading this. He has been using for [facility] as a primary dressing. When he was here the last visit he was felt to have hyper granulated tissue and was treated with silver nitrate chemical cauterization 10-14-2022 upon evaluation today patient appears to be doing poorly in regard to the wound in the right ischial location. Unfortunately he tells me that the dressing seems to be falling off frequently. Unfortunately I think that this has contributed to him developing an infection which appears to be exemplified by the fact that he has bone exposed which is actually necrotic and working its way out of the wound up and have to perform a fairly significant debridement today it does appear. I think that he needs to have these dressings ensure that they are in place at all times. Also discussed with the patient that we will get a need to do some testing in order to see if he indeed has an infection. If he does have a bone infection which I think is pretty likely to be  osteomyelitis of this ischial location. I discussed that with the patient in detail today and depending on what we see him and make any adjustments in care as far as going forward is concerned. Obviously depressed antibiotic that we gave him that he tells me was somewhat irritating as far as the stomach side effects are concerned. He states he really would not want to be on that again. Either way I feel he is probably going to be placed on something once we get the results of the culture back. I am honestly concerned about the fact that he comes into the clinic without any dressings in place I am not sure how often they are really staying where they need to be as far as this wound is concerned. I actually cannot remember seeing him anytime in the past 2 months or so at least that he came in with a dressing intact. 10-21-2022 upon evaluation today patient presents for initial inspection here in our clinic concerning issues that he has been having with a wound in the sacral area. Again last time he was here I did debride away necrotic bone which was confirmed on pathology although it  was not necessarily confirmed that this was secondary to osteomyelitis on the pathology report. Also the x-ray did not neither confirm nor deny the presence of osteomyelitis and his culture unfortunately showed multiple organisms with nothing predominance of this does not help to rule and tailor down exactly what is going on here. Fortunately I do not see any signs of active infection systemically which is great news. With that being said locally I am definitely seeing some issues here. In fact this is warm to touch around the sacral area and I think that cellulitis is definitely present I just cannot pinpoint the exact organism. With that being said I did discuss with the patient [name], [name] ([id number]) [id number] Page 7 of 9 as well today that I do believe that he probably needs an MRI in  order to further quantify the extent of her likely osteomyelitis and this again is something that we are going to order this will need to be without contrast due to the fact that he is allergic to contrast dye. 11-18-2022 upon evaluation today patient's wound is actually showing some signs of improvement currently. We have been doing the Dakin's moistened gauze dressing which I think is helping to clean up the wound for seeing some granulation growing and still the tissue is not quite as healthy as I would love to see but better than previous. He does have osteomyelitis but the good news is he seems to be treated well with the antibiotics he does have a refill that was just picked up and I think that he needs to continue to take that for certain. 3/7; patient is on a 6-week clinic hiatus. He lives at home with his sister however she is not at home all the time. He transfers out of bed and a sliding board. He claims he is only in the in the wheelchair about 4 5 hours. He does have underlying osteomyelitis I am not sure about the status of the antibiotics here. Part of the wound still easily probes to bone 12-23-2022 upon evaluation today patient appears to be doing well currently in regard to his wound all things considered there is still a lot of hypergranulation but I do feel like that with the Vashe moistened gauze is actually doing better than he was previously with the Hydrofera Blue. This just did not seem to be staying in place and with the Vashe through able to change this more frequently. Readmission: 03-31-2023 this is a gentleman whom I have seen multiple times intermittently throughout the years compliance has been a real issue however. I do feel like there is some significant issues here still with the fact that dressing changes and keeping dressings in place are still pretty much as a significant problem here for [place]. I do not see any signs of active infection at this time which is great news  but at the same time the wounds do not worse in fact he has another wound different than what I even noted previous. Subsequently he I do believe that the patient should continue to do monitor for any evidence of infection or worsening. Overall I think he needs to have regular follow-up visits with [place] and I think that he needs to have dressings in place at all times. He tells me that he is at home primarily by himself transferring himself from the first thing in the morning through at least 3 in the afternoon as his sister works he really needs somebody gets a caregiver with him  throughout the day. 04-12-2023 upon evaluation today patient appears to be doing a little worse currently in regard to his wounds. He has been tolerating the dressing changes without complication. Fortunately I do not see any evidence of active infection locally nor systemically at this point. 04-21-2023 upon evaluation today patient's wound unfortunately appears to have purulent drainage and also there is a malodorous drainage at this point. Unfortunately I think that there is something going on here that does not seem to be doing nearly as good as what we would like to see. I think that he is likely going to require some antibiotics. He has taken Cipro before without complication. 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 On 7/18 he had purulent drainage he had a PCR culture and is now on Cipro he is tolerating this well. Using Hydrofera Blue's a 2 bit and border foam his sister is changing the dressing After his sister goes to work the patient is apparently in a wheelchair all day. There is no way to heal the wound like this with that degree of prolonged pressure. Objective Constitutional Patient is hypertensive.. Pulse regular and within target range for patient.Marland Kitchen Respirations regular, non-labored and within target range.. Temperature is normal and within the target range for the  patient.Marland Kitchen appears in no distress. Vitals Time Taken: 8:04 AM, Height: 59 in, Weight: 90 lbs, BMI: 18.2, Temperature: 97.8 F, Pulse: 88 bpm, Respiratory Rate: 18 breaths/min, Blood Pressure: 98/62 mmHg. General Notes: Wound exam; this patient has a stage IV pressure area with an area of exposed bone. There is no purulent drainage and no odor this week which is an improvement on antibiotics.no erythema no pur Integumentary (Hair, Skin) Wound #5 status is Open. Original cause of wound was Gradually Appeared. The date acquired was: 10/04/2021. The wound has been in treatment 4 weeks. The wound is located on the Right,Midline Gluteus. The wound measures 5.7cm length x 3cm width x 1.7cm depth; 13.43cm^2 area and 22.832cm^3 volume. There is bone, muscle, and Fat Layer (Subcutaneous Tissue) exposed. There is no tunneling or undermining noted. There is a large amount of serosanguineous drainage noted. There is large (67-100%) red granulation within the wound bed. There is a small (1-33%) amount of necrotic tissue within the wound bed. Wound #6 status is Open. Original cause of wound was Gradually Appeared. The date acquired was: 10/04/2021. The wound has been in treatment 4 weeks. The wound is located on the Right,Lateral Gluteus. The wound measures 2.7cm length x 2cm width x 1.5cm depth; 4.241cm^2 area and 6.362cm^3 volume. There is Fat Layer (Subcutaneous Tissue) exposed. There is no tunneling or undermining noted. There is a large amount of serosanguineous drainage noted. There is medium (34-66%) red granulation within the wound bed. There is a medium (34-66%) amount of necrotic tissue within the wound bed including Adherent Slough. Assessment Active Problems ICD-10 Pressure ulcer of right buttock, stage 4 Spina bifida occulta Colostomy status Essential (primary) hypertension Atherosclerotic heart disease of native coronary artery without angina pectoris [name], [name] ([id number])  [id number] Page 8 of 9 Plan Follow-up Appointments: Return Appointment in 1 week. Bathing/ Shower/ Hygiene: Wash wounds with antibacterial soap and water. Anesthetic (Use 'Patient Medications' Section for Anesthetic Order Entry): Lidocaine applied to wound bed WOUND #5: - Gluteus Wound Laterality: Right, Midline Cleanser: Byram Ancillary Kit - 15 Day Supply (Generic) 3 x Per Week/30 Days Discharge Instructions: Use supplies as instructed; Kit contains: (15) Saline Bullets; (15)  3x3 Gauze; 15 pr Gloves Prim Dressing: Hydrofera Blue Ready Transfer Foam, 4x5 (in/in) (Generic) 3 x Per Week/30 Days ary Discharge Instructions: Apply Hydrofera Blue Ready to wound bed as directed Secondary Dressing: (BORDER) Zetuvit Plus SILICONE BORDER Dressing 4x4 (in/in) (Generic) 3 x Per Week/30 Days Discharge Instructions: Please do not put silicone bordered dressings under wraps. Use non-bordered dressing only. WOUND #6: - Gluteus Wound Laterality: Right, Lateral Cleanser: Byram Ancillary Kit - 15 Day Supply (Generic) 3 x Per Week/30 Days Discharge Instructions: Use supplies as instructed; Kit contains: (15) Saline Bullets; (15) 3x3 Gauze; 15 pr Gloves Prim Dressing: Hydrofera Blue Ready Transfer Foam, 4x5 (in/in) (Generic) 3 x Per Week/30 Days ary Discharge Instructions: Apply Hydrofera Blue Ready to wound bed as directed Secondary Dressing: (BORDER) Zetuvit Plus SILICONE BORDER Dressing 4x4 (in/in) (Generic) 3 x Per Week/30 Days Discharge Instructions: Please do not put silicone bordered dressings under wraps. Use non-bordered dressing only. 1. Stage IV pressure ulcer. I continued the Hydrofera Blue visit to vet and the border foam dressings. I did not change the antibiotics 2. It would be quite possible to do a bone biopsy here for culture but there is no point in doing that while the patient is taking systemic antibiotics. 1 could argue that with him sitting in the chair for  as many hours he does he done during the day that this is not a healing situation Electronic Signature(s) Signed: 04/29/2023 7:34:23 AM By: Baltazar Najjar MD Entered By: Baltazar Najjar on 04/28/2023 09:00:05 -------------------------------------------------------------------------------- SuperBill Details Patient Name: Date of Service: [name] TH, [name] 04/28/2023 Medical Record Number: [id number] Patient Account Number: [id number] Date of Birth/Sex: Treating RN: April 07, 1966 (56 y.o. Chase Petit) [name] Primary Care Provider: Aram Beecham Other Clinician: Referring Provider: Treating Provider/Extender: RO BSO N, MICHA EL Cathlean Sauer in Treatment: 4 Diagnosis Coding ICD-10 Codes Code Description L89.314 Pressure ulcer of right buttock, stage 4 Q76.0 Spina bifida occulta Z93.3 Colostomy status I10 Essential (primary) hypertension I25.10 Atherosclerotic heart disease of native coronary artery without angina pectoris Facility Procedures : CPT4 Code: [id number] Description: 99213 - WOUND CARE VISIT-LEV 3 EST PT Modifier: Quantity: 1 Physician Procedures : CPT4 Code Description Modifier [id number] 99213 - WC PHYS LEVEL 3 - EST PT [name] ([id number]) [id number] Page 9 of 9 ICD-10 Diagnosis Description L89.314 Pressure ulcer of right buttock, stage 4 Q76.0 Spina bifida  occulta Quantity: 1 Electronic Signature(s) Signed: 04/29/2023 7:34:23 AM By: Baltazar Najjar MD Entered By: Baltazar Najjar on 04/28/2023 08:59:33

## 2023-05-05 ENCOUNTER — Encounter: Payer: 59 | Attending: Physician Assistant | Admitting: Physician Assistant

## 2023-05-05 DIAGNOSIS — Z982 Presence of cerebrospinal fluid drainage device: Secondary | ICD-10-CM | POA: Insufficient documentation

## 2023-05-05 DIAGNOSIS — N319 Neuromuscular dysfunction of bladder, unspecified: Secondary | ICD-10-CM | POA: Insufficient documentation

## 2023-05-05 DIAGNOSIS — I2510 Atherosclerotic heart disease of native coronary artery without angina pectoris: Secondary | ICD-10-CM | POA: Insufficient documentation

## 2023-05-05 DIAGNOSIS — Q760 Spina bifida occulta: Secondary | ICD-10-CM | POA: Insufficient documentation

## 2023-05-05 DIAGNOSIS — Z87442 Personal history of urinary calculi: Secondary | ICD-10-CM | POA: Insufficient documentation

## 2023-05-05 DIAGNOSIS — Z933 Colostomy status: Secondary | ICD-10-CM | POA: Insufficient documentation

## 2023-05-05 DIAGNOSIS — N183 Chronic kidney disease, stage 3 unspecified: Secondary | ICD-10-CM | POA: Insufficient documentation

## 2023-05-05 DIAGNOSIS — M1990 Unspecified osteoarthritis, unspecified site: Secondary | ICD-10-CM | POA: Insufficient documentation

## 2023-05-05 DIAGNOSIS — H409 Unspecified glaucoma: Secondary | ICD-10-CM | POA: Insufficient documentation

## 2023-05-05 DIAGNOSIS — L89314 Pressure ulcer of right buttock, stage 4: Secondary | ICD-10-CM | POA: Insufficient documentation

## 2023-05-05 DIAGNOSIS — G8220 Paraplegia, unspecified: Secondary | ICD-10-CM | POA: Insufficient documentation

## 2023-05-05 DIAGNOSIS — I129 Hypertensive chronic kidney disease with stage 1 through stage 4 chronic kidney disease, or unspecified chronic kidney disease: Secondary | ICD-10-CM | POA: Insufficient documentation

## 2023-05-12 ENCOUNTER — Emergency Department: Payer: 59

## 2023-05-12 ENCOUNTER — Encounter: Payer: 59 | Admitting: Physician Assistant

## 2023-05-12 ENCOUNTER — Inpatient Hospital Stay
Admission: EM | Admit: 2023-05-12 | Discharge: 2023-05-17 | DRG: 539 | Disposition: A | Discharge status: Home / Self Care | Payer: 59 | Attending: Internal Medicine | Admitting: Internal Medicine

## 2023-05-12 DIAGNOSIS — L89329 Pressure ulcer of left buttock, unspecified stage: Secondary | ICD-10-CM | POA: Diagnosis present

## 2023-05-12 DIAGNOSIS — E876 Hypokalemia: Secondary | ICD-10-CM | POA: Diagnosis present

## 2023-05-12 DIAGNOSIS — G8220 Paraplegia, unspecified: Secondary | ICD-10-CM | POA: Diagnosis present

## 2023-05-12 DIAGNOSIS — N132 Hydronephrosis with renal and ureteral calculous obstruction: Secondary | ICD-10-CM | POA: Diagnosis present

## 2023-05-12 DIAGNOSIS — E785 Hyperlipidemia, unspecified: Secondary | ICD-10-CM | POA: Diagnosis present

## 2023-05-12 DIAGNOSIS — Z932 Ileostomy status: Secondary | ICD-10-CM | POA: Diagnosis not present

## 2023-05-12 DIAGNOSIS — T148XXA Other injury of unspecified body region, initial encounter: Secondary | ICD-10-CM | POA: Diagnosis not present

## 2023-05-12 DIAGNOSIS — Z91041 Radiographic dye allergy status: Secondary | ICD-10-CM | POA: Diagnosis not present

## 2023-05-12 DIAGNOSIS — Z79899 Other long term (current) drug therapy: Secondary | ICD-10-CM

## 2023-05-12 DIAGNOSIS — L089 Local infection of the skin and subcutaneous tissue, unspecified: Secondary | ICD-10-CM

## 2023-05-12 DIAGNOSIS — Z882 Allergy status to sulfonamides status: Secondary | ICD-10-CM

## 2023-05-12 DIAGNOSIS — Z7401 Bed confinement status: Secondary | ICD-10-CM | POA: Diagnosis not present

## 2023-05-12 DIAGNOSIS — L89313 Pressure ulcer of right buttock, stage 3: Secondary | ICD-10-CM | POA: Diagnosis present

## 2023-05-12 DIAGNOSIS — Q059 Spina bifida, unspecified: Secondary | ICD-10-CM | POA: Diagnosis not present

## 2023-05-12 DIAGNOSIS — M86151 Other acute osteomyelitis, right femur: Secondary | ICD-10-CM | POA: Diagnosis not present

## 2023-05-12 DIAGNOSIS — H409 Unspecified glaucoma: Secondary | ICD-10-CM | POA: Diagnosis not present

## 2023-05-12 DIAGNOSIS — N319 Neuromuscular dysfunction of bladder, unspecified: Secondary | ICD-10-CM | POA: Diagnosis present

## 2023-05-12 DIAGNOSIS — N179 Acute kidney failure, unspecified: Secondary | ICD-10-CM | POA: Diagnosis present

## 2023-05-12 DIAGNOSIS — L89319 Pressure ulcer of right buttock, unspecified stage: Secondary | ICD-10-CM

## 2023-05-12 DIAGNOSIS — S31000A Unspecified open wound of lower back and pelvis without penetration into retroperitoneum, initial encounter: Secondary | ICD-10-CM | POA: Diagnosis not present

## 2023-05-12 DIAGNOSIS — Z933 Colostomy status: Secondary | ICD-10-CM | POA: Diagnosis not present

## 2023-05-12 DIAGNOSIS — N1831 Chronic kidney disease, stage 3a: Secondary | ICD-10-CM | POA: Diagnosis present

## 2023-05-12 DIAGNOSIS — Q760 Spina bifida occulta: Secondary | ICD-10-CM | POA: Diagnosis not present

## 2023-05-12 DIAGNOSIS — R54 Age-related physical debility: Secondary | ICD-10-CM | POA: Diagnosis present

## 2023-05-12 DIAGNOSIS — N183 Chronic kidney disease, stage 3 unspecified: Secondary | ICD-10-CM | POA: Diagnosis not present

## 2023-05-12 DIAGNOSIS — M4628 Osteomyelitis of vertebra, sacral and sacrococcygeal region: Principal | ICD-10-CM | POA: Diagnosis present

## 2023-05-12 DIAGNOSIS — Z515 Encounter for palliative care: Secondary | ICD-10-CM | POA: Diagnosis not present

## 2023-05-12 DIAGNOSIS — I129 Hypertensive chronic kidney disease with stage 1 through stage 4 chronic kidney disease, or unspecified chronic kidney disease: Secondary | ICD-10-CM | POA: Diagnosis present

## 2023-05-12 DIAGNOSIS — L89314 Pressure ulcer of right buttock, stage 4: Secondary | ICD-10-CM | POA: Diagnosis present

## 2023-05-12 DIAGNOSIS — Z87442 Personal history of urinary calculi: Secondary | ICD-10-CM | POA: Diagnosis not present

## 2023-05-12 DIAGNOSIS — Z91040 Latex allergy status: Secondary | ICD-10-CM | POA: Diagnosis not present

## 2023-05-12 DIAGNOSIS — K219 Gastro-esophageal reflux disease without esophagitis: Secondary | ICD-10-CM | POA: Diagnosis present

## 2023-05-12 DIAGNOSIS — Z982 Presence of cerebrospinal fluid drainage device: Secondary | ICD-10-CM | POA: Diagnosis not present

## 2023-05-12 DIAGNOSIS — I2510 Atherosclerotic heart disease of native coronary artery without angina pectoris: Secondary | ICD-10-CM | POA: Diagnosis not present

## 2023-05-12 DIAGNOSIS — Z885 Allergy status to narcotic agent status: Secondary | ICD-10-CM | POA: Diagnosis not present

## 2023-05-12 DIAGNOSIS — M1990 Unspecified osteoarthritis, unspecified site: Secondary | ICD-10-CM | POA: Diagnosis not present

## 2023-05-12 LAB — CBC WITH DIFFERENTIAL/PLATELET
Abs Immature Granulocytes: 0.03 10*3/uL (ref 0.00–0.07)
Basophils Absolute: 0.1 10*3/uL (ref 0.0–0.1)
Basophils Relative: 1 %
Eosinophils Absolute: 0.2 10*3/uL (ref 0.0–0.5)
Eosinophils Relative: 2 %
HCT: 42.5 % (ref 39.0–52.0)
Hemoglobin: 12.8 g/dL — ABNORMAL LOW (ref 13.0–17.0)
Immature Granulocytes: 0 %
Lymphocytes Relative: 15 %
Lymphs Abs: 1.4 10*3/uL (ref 0.7–4.0)
MCH: 23.7 pg — ABNORMAL LOW (ref 26.0–34.0)
MCHC: 30.1 g/dL (ref 30.0–36.0)
MCV: 78.6 fL — ABNORMAL LOW (ref 80.0–100.0)
Monocytes Absolute: 0.9 10*3/uL (ref 0.1–1.0)
Monocytes Relative: 9 %
Neutro Abs: 6.9 10*3/uL (ref 1.7–7.7)
Neutrophils Relative %: 73 %
Platelets: 599 10*3/uL — ABNORMAL HIGH (ref 150–400)
RBC: 5.41 MIL/uL (ref 4.22–5.81)
RDW: 16.4 % — ABNORMAL HIGH (ref 11.5–15.5)
WBC: 9.4 10*3/uL (ref 4.0–10.5)
nRBC: 0 % (ref 0.0–0.2)

## 2023-05-12 LAB — COMPREHENSIVE METABOLIC PANEL
ALT: 20 U/L (ref 0–44)
AST: 25 U/L (ref 15–41)
Albumin: 3.3 g/dL — ABNORMAL LOW (ref 3.5–5.0)
Alkaline Phosphatase: 77 U/L (ref 38–126)
Anion gap: 14 (ref 5–15)
BUN: 31 mg/dL — ABNORMAL HIGH (ref 6–20)
CO2: 26 mmol/L (ref 22–32)
Calcium: 9.3 mg/dL (ref 8.9–10.3)
Chloride: 96 mmol/L — ABNORMAL LOW (ref 98–111)
Creatinine, Ser: 1.59 mg/dL — ABNORMAL HIGH (ref 0.61–1.24)
GFR, Estimated: 51 mL/min — ABNORMAL LOW (ref >60)
Glucose, Bld: 83 mg/dL (ref 70–99)
Potassium: 2.2 mmol/L — CL (ref 3.5–5.1)
Sodium: 136 mmol/L (ref 135–145)
Total Bilirubin: 0.4 mg/dL (ref 0.3–1.2)
Total Protein: 9.1 g/dL — ABNORMAL HIGH (ref 6.5–8.1)

## 2023-05-12 LAB — LACTIC ACID, PLASMA
Lactic Acid, Venous: 1.8 mmol/L (ref 0.5–1.9)
Lactic Acid, Venous: 1.5 mmol/L (ref 0.5–1.9)

## 2023-05-12 LAB — MAGNESIUM: Magnesium: 2.6 mg/dL — ABNORMAL HIGH (ref 1.7–2.4)

## 2023-05-12 MED ORDER — ALBUTEROL SULFATE (2.5 MG/3ML) 0.083% IN NEBU: 2.5000 mg | INHALATION_SOLUTION | RESPIRATORY_TRACT | Status: DC | PRN

## 2023-05-12 MED ORDER — PIPERACILLIN-TAZOBACTAM 3.375 G IVPB
3.3750 g | Freq: Three times a day (TID) | INTRAVENOUS | Status: DC
  Administered 2023-05-12 – 2023-05-17 (×14): 3.375 g via INTRAVENOUS
  Filled 2023-05-12 (×14): qty 50

## 2023-05-12 MED ORDER — LACTATED RINGERS IV BOLUS
1000.0000 mL | Freq: Once | INTRAVENOUS | Status: AC
  Administered 2023-05-12: 1000 mL via INTRAVENOUS

## 2023-05-12 MED ORDER — HYDROCODONE-ACETAMINOPHEN 5-325 MG PO TABS
1.0000 | ORAL_TABLET | ORAL | Status: DC | PRN
  Administered 2023-05-13: 1 via ORAL
  Filled 2023-05-12: qty 1

## 2023-05-12 MED ORDER — ENOXAPARIN SODIUM 40 MG/0.4ML IJ SOSY
40.0000 mg | PREFILLED_SYRINGE | INTRAMUSCULAR | Status: DC
  Administered 2023-05-12 – 2023-05-16 (×5): 40 mg via SUBCUTANEOUS
  Filled 2023-05-12 (×5): qty 0.4

## 2023-05-12 MED ORDER — VANCOMYCIN HCL IN DEXTROSE 1-5 GM/200ML-% IV SOLN
1000.0000 mg | INTRAVENOUS | Status: DC
  Administered 2023-05-14: 1000 mg via INTRAVENOUS
  Filled 2023-05-12 (×2): qty 200

## 2023-05-12 MED ORDER — ONDANSETRON HCL 4 MG PO TABS: 4.0000 mg | ORAL_TABLET | Freq: Four times a day (QID) | ORAL | Status: DC | PRN

## 2023-05-12 MED ORDER — PANTOPRAZOLE SODIUM 40 MG PO TBEC
40.0000 mg | DELAYED_RELEASE_TABLET | Freq: Every day | ORAL | Status: DC
  Administered 2023-05-13 – 2023-05-17 (×5): 40 mg via ORAL
  Filled 2023-05-12 (×5): qty 1

## 2023-05-12 MED ORDER — FOLIC ACID 1 MG PO TABS
1.0000 mg | ORAL_TABLET | Freq: Every day | ORAL | Status: DC
  Administered 2023-05-13 – 2023-05-17 (×5): 1 mg via ORAL
  Filled 2023-05-12 (×5): qty 1

## 2023-05-12 MED ORDER — POTASSIUM CHLORIDE CRYS ER 20 MEQ PO TBCR
40.0000 meq | EXTENDED_RELEASE_TABLET | Freq: Once | ORAL | Status: AC
  Administered 2023-05-12: 40 meq via ORAL
  Filled 2023-05-12: qty 2

## 2023-05-12 MED ORDER — PIPERACILLIN-TAZOBACTAM 3.375 G IVPB 30 MIN
3.3750 g | Freq: Once | INTRAVENOUS | Status: AC
  Administered 2023-05-12: 3.375 g via INTRAVENOUS
  Filled 2023-05-12: qty 50

## 2023-05-12 MED ORDER — POTASSIUM CHLORIDE 10 MEQ/100ML IV SOLN
10.0000 meq | INTRAVENOUS | Status: DC
  Filled 2023-05-12: qty 100

## 2023-05-12 MED ORDER — ONDANSETRON HCL 4 MG/2ML IJ SOLN: 4.0000 mg | Freq: Four times a day (QID) | INTRAMUSCULAR | Status: DC | PRN

## 2023-05-12 MED ORDER — POTASSIUM CHLORIDE 2 MEQ/ML IV SOLN
INTRAVENOUS | Status: DC
  Filled 2023-05-12: qty 1000

## 2023-05-12 MED ORDER — SUCRALFATE 1 G PO TABS
1.0000 g | ORAL_TABLET | Freq: Three times a day (TID) | ORAL | Status: DC
  Administered 2023-05-12 – 2023-05-17 (×14): 1 g via ORAL
  Filled 2023-05-12 (×14): qty 1

## 2023-05-12 MED ORDER — ROSUVASTATIN CALCIUM 10 MG PO TABS
10.0000 mg | ORAL_TABLET | Freq: Every day | ORAL | Status: DC
  Administered 2023-05-12 – 2023-05-16 (×5): 10 mg via ORAL
  Filled 2023-05-12 (×5): qty 1

## 2023-05-12 MED ORDER — VANCOMYCIN HCL IN DEXTROSE 1-5 GM/200ML-% IV SOLN
1000.0000 mg | Freq: Once | INTRAVENOUS | Status: AC
  Administered 2023-05-12: 1000 mg via INTRAVENOUS
  Filled 2023-05-12: qty 200

## 2023-05-12 MED ORDER — TRAZODONE HCL 50 MG PO TABS
50.0000 mg | ORAL_TABLET | Freq: Every day | ORAL | Status: DC
  Administered 2023-05-12 – 2023-05-16 (×5): 50 mg via ORAL
  Filled 2023-05-12 (×6): qty 1

## 2023-05-12 MED ORDER — ACETAMINOPHEN 325 MG PO TABS: 650.0000 mg | ORAL_TABLET | Freq: Four times a day (QID) | ORAL | Status: DC | PRN

## 2023-05-12 MED ORDER — THIAMINE MONONITRATE 100 MG PO TABS
100.0000 mg | ORAL_TABLET | Freq: Every day | ORAL | Status: DC
  Administered 2023-05-13 – 2023-05-17 (×5): 100 mg via ORAL
  Filled 2023-05-12 (×5): qty 1

## 2023-05-12 MED ORDER — ADULT MULTIVITAMIN W/MINERALS CH
1.0000 | ORAL_TABLET | Freq: Every day | ORAL | Status: DC
  Administered 2023-05-13 – 2023-05-17 (×5): 1 via ORAL
  Filled 2023-05-12 (×5): qty 1

## 2023-05-12 MED ORDER — LACTATED RINGERS IV SOLN: INTRAVENOUS | Status: DC

## 2023-05-12 MED ORDER — ACETAMINOPHEN 650 MG RE SUPP: 650.0000 mg | Freq: Four times a day (QID) | RECTAL | Status: DC | PRN

## 2023-05-12 NOTE — H&P (Signed)
History and Physical    [name] YQM:[id number] DOB: 1966-03-13 DOA: 05/12/2023  PCP: Marguarite Arbour, MD (Confirm with patient/family/NH records and if not entered, this has to be entered at [facility] point of entry) Patient coming from: Home  I have personally briefly reviewed patient's old medical records in [facility]  Chief Complaint: Drainage from sacral wound  HPI: [name] is a 57 y.o. male with medical history significant of spina bifida, neurogenic bladder, chronic bilateral hydronephrosis, ostomy, hypertension, hyperlipidemia, GERD, CKD stage IIIa who presents for evaluation of increased drainage and size of known sacral ulcer.  Patient follows with the wound care clinic and was directed to the ED by the wound care physician when the wound was evaluated and noted to have increased substantially in size and have foul-smelling drainage.  On arrival in ED patient is not overtly septic.  He does deny pain.  He has significant neuropathy and no feeling of the sacrum.  He is hemodynamically stable with no signs of overt sepsis or shock.  CT abdomen pelvis performed without contrast in the emergency department (patient has a contrast allergy) demonstrates acute on chronic sacral osteomyelitis.  Patient was started on broad-spectrum IV antibiotics vancomycin and Zosyn and hospitalist contacted for admission.  ED Course: Initial evaluation revealed stable vital signs.  Laboratory workup significant for severe hypokalemia at 2.2.  No other major laboratory derangements identified.  Patient afebrile.  Normal white count.  Patient started on vancomycin and Zosyn.  Given a bolus of fluids.  Hospitalist contacted for admission.  Review of Systems: As per HPI otherwise 14 point review of systems negative.    Past Medical History:  Diagnosis Date   Abdominal distention 10/11/2016   Acute lower UTI 12/22/2019   Acute sepsis (HCC) 04/21/2016   AKI (acute kidney injury) (HCC) 06/27/2020    Decubitus ulcer    Distal intestinal obstruction syndrome (HCC) 10/11/2016   Fecal impaction in rectum (HCC) 10/10/2016   Gastric outlet obstruction 06/03/2016   Gastritis    GERD (gastroesophageal reflux disease) 12/22/2019   HTN (hypertension) 06/27/2020   Hydronephrosis    Hyperlipidemia    Hypokalemia 10/11/2016   Impaction of the bowels (HCC)    Kidney stone    Left ureteral stone 06/27/2020   Leukocytosis 10/11/2016   Loose stools 10/11/2016   Nausea & vomiting 06/03/2016   Renal disorder    SBO (small bowel obstruction) (HCC) 11/02/2017   Sepsis due to gram-negative UTI (HCC) 03/08/2021   Septic shock (HCC) 03/08/2021   SIRS (systemic inflammatory response syndrome) (HCC) 06/03/2016   Spina bifida (HCC)    Ulcer of esophagus with bleeding     Past Surgical History:  Procedure Laterality Date   ABDOMINAL SURGERY     APPENDECTOMY     BACK SURGERY     COLON SURGERY     CREATION / REVISION OF ILEOSTOMY / JEJUNOSTOMY  05/2015   CYSTOSCOPY WITH RETROGRADE PYELOGRAM, URETEROSCOPY AND STENT PLACEMENT  03/2014   ESOPHAGOGASTRODUODENOSCOPY (EGD) WITH PROPOFOL N/A 06/03/2016   Procedure: ESOPHAGOGASTRODUODENOSCOPY (EGD) WITH PROPOFOL;  Surgeon: Midge Minium, MD;  Location: [facility] ENDOSCOPY;  Service: Endoscopy;  Laterality: N/A;   ESOPHAGOGASTRODUODENOSCOPY (EGD) WITH PROPOFOL N/A 04/27/2019   Procedure: ESOPHAGOGASTRODUODENOSCOPY (EGD) WITH PROPOFOL;  Surgeon: Wyline Mood, MD;  Location: [facility] ENDOSCOPY;  Service: Gastroenterology;  Laterality: N/A;   ilieostomy     PERCUTANEOUS NEPHROSTOLITHOTOMY     PERCUTANEOUS NEPHROSTOMY     VENTRICULOPERITONEAL SHUNT     vp shunt removal  reports that he has never smoked. He has never used smokeless tobacco. He reports that he does not drink alcohol and does not use drugs.  Allergies  Allergen Reactions   Morphine And Codeine Nausea And Vomiting   Sulfa Antibiotics Other (See Comments)    Reaction: unknown   Ivp Dye [Iodinated Contrast Media]  Hives   Latex Rash    Family History  Problem Relation Age of Onset   Diabetes Mellitus II Father    Bladder Cancer Neg Hx    Kidney cancer Neg Hx    Prostate cancer Neg Hx    No family history of spina bifida  Prior to Admission medications   Medication Sig Start Date End Date Taking? Authorizing Provider  clotrimazole-betamethasone (LOTRISONE) cream Apply 1 application. topically daily. 12/07/21   McDonald, Rachelle Hora, DPM  collagenase (SANTYL) ointment Apply topically daily. 09/24/21   Lurene Shadow, MD  Cranberry-Vitamin C-Probiotic (AZO CRANBERRY PO) Take 1 tablet by mouth daily.    [provider]  dicyclomine (BENTYL) 20 MG tablet Take 20 mg by mouth 2 (two) times daily.    [provider]  hydrochlorothiazide (HYDRODIURIL) 25 MG tablet Take 25 mg by mouth daily. 06/19/20   [provider]  Multiple Vitamins-Minerals (CENTRUM MEN PO) Take 1 tablet by mouth daily.     [provider]  pantoprazole (PROTONIX) 40 MG tablet Take 1 tablet (40 mg total) by mouth daily. 04/29/19   Houston Siren, MD  potassium chloride SA (KLOR-CON) 20 MEQ tablet Take 20 mEq by mouth daily.    [provider]  rosuvastatin (CRESTOR) 10 MG tablet Take 10 mg by mouth at bedtime. 10/20/19   [provider]  sucralfate (CARAFATE) 1 g tablet Take 1 g by mouth 3 (three) times daily. 12/14/19   [provider]  traZODone (DESYREL) 50 MG tablet Take 50 mg by mouth at bedtime. 02/15/21   [provider]    Physical Exam: Vitals:   05/12/23 1445 05/12/23 1451 05/12/23 1517  BP:  95/65 90/64  Pulse:  88 89  Resp:  18 16  Temp:  99 F (37.2 C) 99.5 F (37.5 C)  TempSrc:  Axillary Oral  SpO2: 99% 100% 100%    Vitals:   05/12/23 1445 05/12/23 1451 05/12/23 1517  BP:  95/65 90/64  Pulse:  88 89  Resp:  18 16  Temp:  99 F (37.2 C) 99.5 F (37.5 C)  TempSrc:  Axillary Oral  SpO2: 99% 100% 100%   General: NAD.  Appears frail and  chronically ill HEENT: Normocephalic, atraumatic Neck, supple, trachea midline, no tenderness Heart: Regular rate and rhythm, S1/S2 normal, no murmurs Lungs: Bibasilar crackles.  Normal work of breathing.  Room air Abdomen: Soft, NT/ND,+ ostomy Extremities: Severe muscle wasting bilateral upper and lower extremities, worse on lower Skin: Stage IV sacral ulcer with drainage Neurologic: Cranial nerves grossly intact, sensation intact, alert and oriented x3 Psychiatric: Normal affect   Labs on Admission: I have personally reviewed following labs and imaging studies  CBC: Recent Labs  Lab 05/12/23 1626  WBC 9.4  NEUTROABS 6.9  HGB 12.8*  HCT 42.5  MCV 78.6*  PLT 599*   Basic Metabolic Panel: Recent Labs  Lab 05/12/23 1626  NA 136  K 2.2*  CL 96*  CO2 26  GLUCOSE 83  BUN 31*  CREATININE 1.59*  CALCIUM 9.3   GFR: CrCl cannot be calculated (Unknown ideal weight.). Liver Function Tests: Recent Labs  Lab 05/12/23  1626  AST 25  ALT 20  ALKPHOS 77  BILITOT 0.4  PROT 9.1*  ALBUMIN 3.3*   No results for input(s): "LIPASE", "AMYLASE" in the last 168 hours. No results for input(s): "AMMONIA" in the last 168 hours. Coagulation Profile: No results for input(s): "INR", "PROTIME" in the last 168 hours. Cardiac Enzymes: No results for input(s): "CKTOTAL", "CKMB", "CKMBINDEX", "TROPONINI" in the last 168 hours. BNP (last 3 results) No results for input(s): "PROBNP" in the last 8760 hours. HbA1C: No results for input(s): "HGBA1C" in the last 72 hours. CBG: No results for input(s): "GLUCAP" in the last 168 hours. Lipid Profile: No results for input(s): "CHOL", "HDL", "LDLCALC", "TRIG", "CHOLHDL", "LDLDIRECT" in the last 72 hours. Thyroid Function Tests: No results for input(s): "TSH", "T4TOTAL", "FREET4", "T3FREE", "THYROIDAB" in the last 72 hours. Anemia Panel: No results for input(s): "VITAMINB12", "FOLATE", "FERRITIN", "TIBC", "IRON", "RETICCTPCT" in the last 72  hours. Urine analysis:    Component Value Date/Time   COLORURINE YELLOW (A) 09/19/2021 1839   APPEARANCEUR CLOUDY (A) 09/19/2021 1839   APPEARANCEUR Cloudy (A) 03/21/2019 1130   LABSPEC 1.010 09/19/2021 1839   PHURINE 9.0 (H) 09/19/2021 1839   GLUCOSEU NEGATIVE 09/19/2021 1839   HGBUR SMALL (A) 09/19/2021 1839   BILIRUBINUR NEGATIVE 09/19/2021 1839   BILIRUBINUR Negative 03/21/2019 1130   KETONESUR NEGATIVE 09/19/2021 1839   PROTEINUR 100 (A) 09/19/2021 1839   NITRITE NEGATIVE 09/19/2021 1839   LEUKOCYTESUR LARGE (A) 09/19/2021 1839    Radiological Exams on Admission: CT PELVIS WO CONTRAST  Result Date: 05/12/2023 CLINICAL DATA:  Osteomyelitis suspected, pelvis, no prior imaging EXAM: CT PELVIS WITHOUT CONTRAST TECHNIQUE: Multidetector CT imaging of the pelvis was performed following the standard protocol without intravenous contrast. RADIATION DOSE REDUCTION: This exam was performed according to the departmental dose-optimization program which includes automated exposure control, adjustment of the mA and/or kV according to patient size and/or use of iterative reconstruction technique. COMPARISON:  CT 09/09/2021, MRI 11/01/2022 FINDINGS: Urinary Tract: Mild diffuse urinary bladder wall thickening, likely accentuated by under-distension. Bowel: No evidence of bowel obstruction or active bowel inflammation within the pelvis. Vascular/Lymphatic: No pathologically enlarged lymph nodes. No significant vascular abnormality seen. Reproductive:  Prostatomegaly. Other:  No free air or free fluid within the pelvis. Musculoskeletal: Large deep decubitus ulcer underlying the right ischium. Progressive bone loss at the ischial tuberosity and inferior pubic ramus on the right with increased sclerosis of the adjacent bone compatible with acute on chronic osteomyelitis. There is adjacent skin thickening and mild stranding within the adjacent fat. No organized fluid collections. Right hip joint remains intact.  Capsular thickening of the right hip joint without large effusion. Chronic left ischial decubitus ulcer with similar degree of sclerosis in the left ischial bone without definite progression of bone loss to suggest acute osteomyelitis at this location. Chronic left hip deformity. Chronic left hip capsular thickening with small joint effusion. No acute fractures. IMPRESSION: 1. Large deep decubitus ulcer underlying the right ischium with progressive bone loss at the ischial tuberosity and inferior pubic ramus on the right with increased sclerosis of the adjacent bone compatible with acute on chronic osteomyelitis. 2. Chronic left ischial decubitus ulcer with similar degree of sclerosis in the left ischial bone without definite progression of bone loss to suggest acute osteomyelitis at this location. 3. Chronic left hip deformity with small joint effusion. 4. Prostatomegaly with mild diffuse urinary bladder wall thickening, likely accentuated by under-distension. Correlate with urinalysis to exclude cystitis. Electronically Signed   By: Janyth Pupa  Plundo D.O.   On: 05/12/2023 16:02    EKG: Not available for review at time of this note  Assessment/Plan Principal Problem:   Wound infection  Right ischial sacral ulcer Left ischial sacral ulcer Acute on chronic sacral osteomyelitis Difficult situation.  Patient clearly has signs of infection.  Sacral osteomyelitis is chronic and severe but may have worsened recently.  Patient is nonseptic/nontoxic-appearing. Plan: Admit inpatient Vancomycin per pharmacy consult Zosyn per pharmacy consult Supplemental IV fluids As needed pain control WOC consult Will involve surgery in a.m. Monitor vitals and fever curve Follow culture data  Severe hypokalemia Potassium 2.2 Plan: Stat EKG Add potassium to maintenance fluids 20 mEq [place] 40 mill equivalents every 2 hours x 2 doses  Spina bifida Chronic bedbound status WOC consult as above Palliative  care consult  Suspected malnutrition RD consultation  Essential hypertension Hold home hydrochlorothiazide in setting of sepsis  Hyperlipidemia PTA Crestor  GERD PPI  AKI on CKD stage IIIa Creatinine slightly above baseline IV fluids as above  DVT prophylaxis: SQ Lovenox Code Status: Full Family Communication: None Disposition Plan: Anticipate return to previous home environment Consults called: Left care, WOC Admission status: Inpatient, MedSurg   Tresa Moore MD [facility]   If 7PM-7AM, please contact night-coverage  05/12/2023, 5:36 PM

## 2023-05-12 NOTE — Progress Notes (Addendum)
[place], [name] ([id number]) [id number] Page 1 of 10 Visit Report for 05/12/2023 Arrival Information Details Patient Name: Date of Service: [name] [facility], DO [place] LD 05/12/2023 1:30 PM Medical Record Number: [id number] Patient Account Number: [id number] Date of Birth/Sex: Treating RN: 11/24/1965 (57 y.o. [name]) [name] Primary Care : Aram Beecham Other Clinician: Referring : Treating /Extender: Gabriel Earing in Treatment: 6 Visit Information History Since Last Visit Added or deleted any medications: No Patient Arrived: Wheel Chair Any new allergies or adverse reactions: No Arrival Time: 13:34 Had a fall or experienced change in No Accompanied By: self activities of daily living that may affect Transfer Assistance: None risk of falls: Patient Identification Verified: Yes Signs or symptoms of abuse/neglect since last visito No Secondary Verification Process Completed: Yes Hospitalized since last visit: No Patient Requires Transmission-Based Precautions: No Implantable device outside of the clinic excluding No Patient Has Alerts: No cellular tissue based products placed in the center since last visit: Has Dressing in Place as Prescribed: No Pain Present Now: No Electronic Signature(s) Signed: 05/12/2023 1:45:08 PM By: [name] RN Entered By: [name] on 05/12/2023 13:45:08 -------------------------------------------------------------------------------- Clinic Level of Care Assessment Details Patient Name: Date of ServiceEulas [name] [facility], [name] 05/12/2023 1:30 PM Medical Record Number: [id number] Patient Account Number: [id number] Date of Birth/Sex: Treating RN: March 13, 1966 (57 y.o. [name]) [name] Primary Care : Aram Beecham Other Clinician: Referring : Treating /Extender: Gabriel Earing in Treatment: 6 Clinic Level of Care Assessment Items TOOL 4 Quantity  Score X- 1 0 Use when only an EandM is performed on FOLLOW-UP visit ASSESSMENTS - Nursing Assessment / Reassessment X- 1 10 Reassessment of Co-morbidities (includes updates in patient status) X- 1 5 Reassessment of Adherence to Treatment Plan [name] ([id number]) [id number] Page 2 of 10 ASSESSMENTS - Wound and Skin A ssessment / Reassessment []  - Simple Wound Assessment / Reassessment - one wound 0 X- 2 5 Complex Wound Assessment / Reassessment - multiple wounds []  - 0 Dermatologic / Skin Assessment (not related to wound area) ASSESSMENTS - Focused Assessment []  - 0 Circumferential Edema Measurements - multi extremities []  - 0 Nutritional Assessment / Counseling / Intervention []  - 0 Lower Extremity Assessment (monofilament, tuning fork, pulses) []  - 0 Peripheral Arterial Disease Assessment (using hand held doppler) ASSESSMENTS - Ostomy and/or Continence Assessment and Care []  - 0 Incontinence Assessment and Management []  - 0 Ostomy Care Assessment and Management (repouching, etc.) PROCESS - Coordination of Care X - Simple Patient / Family Education for ongoing care 1 15 []  - 0 Complex (extensive) Patient / Family Education for ongoing care []  - 0 Staff obtains Chiropractor, Records, T Results / Process Orders est []  - 0 Staff telephones HHA, Nursing Homes / Clarify orders / etc []  - 0 Routine Transfer to another Facility (non-emergent condition) []  - 0 Routine Hospital Admission (non-emergent condition) []  - 0 New Admissions / Manufacturing engineer / Ordering NPWT Apligraf, etc. , []  - 0 Emergency Hospital Admission (emergent condition) X- 1 10 Simple Discharge Coordination []  - 0 Complex (extensive) Discharge Coordination PROCESS - Special Needs []  - 0 Pediatric / Minor Patient Management []  - 0 Isolation Patient Management []  - 0 Hearing / Language / Visual special needs []  - 0 Assessment of Community assistance  (transportation, D/C planning, etc.) []  - 0 Additional assistance / Altered mentation []  - 0 Support Surface(s) Assessment (bed, cushion, seat, etc.) INTERVENTIONS - Wound Cleansing / Measurement []  - 0 Simple  Wound Cleansing - one wound X- 2 5 Complex Wound Cleansing - multiple wounds X- 1 5 Wound Imaging (photographs - any number of wounds) []  - 0 Wound Tracing (instead of photographs) []  - 0 Simple Wound Measurement - one wound X- 2 5 Complex Wound Measurement - multiple wounds INTERVENTIONS - Wound Dressings X - Small Wound Dressing one or multiple wounds 2 10 []  - 0 Medium Wound Dressing one or multiple wounds []  - 0 Large Wound Dressing one or multiple wounds []  - 0 Application of Medications - topical []  - 0 Application of Medications - injection INTERVENTIONS - Miscellaneous []  - 0 External ear exam [name] ([id number]) [id number] Page 3 of 10 []  - 0 Specimen Collection (cultures, biopsies, blood, body fluids, etc.) []  - 0 Specimen(s) / Culture(s) sent or taken to Lab for analysis []  - 0 Patient Transfer (multiple staff / Michiel Sites Lift / Similar devices) []  - 0 Simple Staple / Suture removal (25 or less) []  - 0 Complex Staple / Suture removal (26 or more) []  - 0 Hypo / Hyperglycemic Management (close monitor of Blood Glucose) []  - 0 Ankle / Brachial Index (ABI) - do not check if billed separately X- 1 5 Vital Signs Has the patient been seen at the hospital within the last three years: Yes Total Score: 100 Level Of Care: New/Established - Level 3 Electronic Signature(s) Signed: 05/19/2023 8:04:04 AM By: [name] RN Entered By: [name] on 05/12/2023 13:58:33 -------------------------------------------------------------------------------- Encounter Discharge Information Details Patient Name: Date of Service: [name] [facility], [name] 05/12/2023 1:30 PM Medical Record Number: [id number] Patient Account Number:  [id number] Date of Birth/Sex: Treating RN: January 25, 1966 (57 y.o. [name] Primary Care : Aram Beecham Other Clinician: Referring : Treating /Extender: Gabriel Earing in Treatment: 6 Encounter Discharge Information Items Discharge Condition: Stable Ambulatory Status: Wheelchair Discharge Destination: Home Transportation: Private Auto Accompanied By: self Schedule Follow-up Appointment: Yes Clinical Summary of Care: Electronic Signature(s) Signed: 05/19/2023 8:04:04 AM By: [name] RN Entered By: [name] on 05/12/2023 13:59:14 -------------------------------------------------------------------------------- Lower Extremity Assessment Details Patient Name: Date of ServiceEulas [name] [facility], [name] 05/12/2023 1:30 PM [place], [name] ([id number]) [id number] Page 4 of 10 Medical Record Number: [id number] Patient Account Number: [id number] Date of Birth/Sex: Treating RN: [birth year] (56 y.o. [name]) [name] Primary Care : Aram Beecham Other Clinician: Referring : Treating /Extender: Hermine Messick Weeks in Treatment: 6 Electronic Signature(s) Signed: 05/12/2023 1:47:32 PM By: [name] RN Entered By: [name] on 05/12/2023 13:47:32 -------------------------------------------------------------------------------- Multi Wound Chart Details Patient Name: Date of Service: [name] [facility], [name] 05/12/2023 1:30 PM Medical Record Number: [id number] Patient Account Number: [id number] Date of Birth/Sex: Treating RN: 09/05/1966 (56 y.o. [name]) [name] Primary Care : Aram Beecham Other Clinician: Referring : Treating /Extender: Hermine Messick Weeks in Treatment: 6 Vital Signs Height(in): 59 Pulse(bpm): 91 Weight(lbs): 90 Blood Pressure(mmHg): 78/58 Body Mass Index(BMI): 18.2 Temperature(F): 97.6 Respiratory  Rate(breaths/min): 16 [5:Photos:] [N/A:N/A] Right, Midline Gluteus Right, Lateral Gluteus N/A Wound Location: Gradually Appeared Gradually Appeared N/A Wounding Event: Pressure Ulcer Pressure Ulcer N/A Primary Etiology: Cataracts, Glaucoma, Coronary Artery Cataracts, Glaucoma, Coronary Artery N/A Comorbid History: Disease, Hypertension, History of Disease, Hypertension, History of pressure wounds, Osteoarthritis, pressure wounds, Osteoarthritis, Paraplegia Paraplegia 10/04/2021 10/04/2021 N/A Date Acquired: 6 6 N/A Weeks of Treatment: Open Open N/A Wound Status: No No N/A Wound Recurrence: 6x4.5x3.2 3x3.5x1.9 N/A Measurements L x W x D (cm) 21.206 8.247 N/A A (  cm) : rea 67.858 15.669 N/A Volume (cm) : -116.00% -311.70% N/A % Reduction in A rea: -360.80% -3807.50% N/A % Reduction in Volume: Category/Stage IV Category/Stage III N/A Classification: Large Large N/A Exudate A mount: Serosanguineous Serosanguineous N/A Exudate Type: red, brown red, brown N/A Exudate Color: Medium (34-66%) Small (1-33%) N/A Granulation A mount: Red Red N/A Granulation Quality: Medium (34-66%) Large (67-100%) N/A Necrotic A mount: Fat Layer (Subcutaneous Tissue): Yes Fat Layer (Subcutaneous Tissue): Yes N/A Exposed Structures: Muscle: Yes Fascia: No Bone: Yes Tendon: No [name] ([id number]) [id number] Page 5 of 10 Fascia: No Muscle: No Tendon: No Joint: No Joint: No Bone: No None None N/A Epithelialization: Treatment Notes Electronic Signature(s) Signed: 05/12/2023 1:47:37 PM By: [name] RN Entered By: [name] on 05/12/2023 13:47:37 -------------------------------------------------------------------------------- Multi-Disciplinary Care Plan Details Patient Name: Date of Service: [name] TH, [name] 05/12/2023 1:30 PM Medical Record Number: [id number] Patient Account Number: [id number] Date of Birth/Sex: Treating RN: July 19, 1966 (56  y.o. [name] Primary Care : Aram Beecham Other Clinician: Referring : Treating /Extender: Hermine Messick Weeks in Treatment: 6 Active Inactive Pressure Nursing Diagnoses: Potential for impaired tissue integrity related to pressure, friction, moisture, and shear Goals: Patient will remain free from development of additional pressure ulcers Date Initiated: 03/31/2023 Target Resolution Date: 07/01/2023 Goal Status: Active Interventions: Assess: immobility, friction, shearing, incontinence upon admission and as needed Assess offloading mechanisms upon admission and as needed Assess potential for pressure ulcer upon admission and as needed Notes: Wound/Skin Impairment Nursing Diagnoses: Knowledge deficit related to ulceration/compromised skin integrity Goals: Patient/caregiver will verbalize understanding of skin care regimen Date Initiated: 03/31/2023 Target Resolution Date: 07/01/2023 Goal Status: Active Ulcer/skin breakdown will have a volume reduction of 30% by week 4 Date Initiated: 03/31/2023 Target Resolution Date: 05/31/2023 Goal Status: Active Ulcer/skin breakdown will have a volume reduction of 50% by week 8 Date Initiated: 03/31/2023 Target Resolution Date: 07/01/2023 Goal Status: Active Ulcer/skin breakdown will have a volume reduction of 80% by week 12 Date Initiated: 03/31/2023 Target Resolution Date: 07/31/2023 Goal Status: Active Ulcer/skin breakdown will heal within 14 weeks Date Initiated: 03/31/2023 Target Resolution Date: 08/31/2023 MAREO, BROWN[place] ([id number]) [id number] Page 6 of 10 Goal Status: Active Interventions: Assess patient/caregiver ability to obtain necessary supplies Assess patient/caregiver ability to perform ulcer/skin care regimen upon admission and as needed Assess ulceration(s) every visit Notes: Electronic Signature(s) Signed: 05/12/2023 1:48:12 PM By: [name]  RN Entered By: [name] on 05/12/2023 13:48:12 -------------------------------------------------------------------------------- Pain Assessment Details Patient Name: Date of ServiceEulas [name] [facility], [name] 05/12/2023 1:30 PM Medical Record Number: [id number] Patient Account Number: [id number] Date of Birth/Sex: Treating RN: 1966-07-29 (56 y.o. [name] Primary Care : Aram Beecham Other Clinician: Referring : Treating /Extender: Hermine Messick Weeks in Treatment: 6 Active Problems Location of Pain Severity and Description of Pain Patient Has Paino No Site Locations Pain Management and Medication Current Pain Management: Electronic Signature(s) Signed: 05/12/2023 1:45:38 PM By: [name] RN Entered By: [name] on 05/12/2023 13:45:37 [name] ([id number]) [id number] Page 7 of 10 -------------------------------------------------------------------------------- Patient/Caregiver Education Details Patient Name: Date of Service: [name] [facility], DO [place] LD 8/8/2024andnbsp1:30 PM Medical Record Number: [id number] Patient Account Number: [id number] Date of Birth/Gender: Treating RN: 03/16/1966 (56 y.o. [name] Primary Care Physician: Aram Beecham Other Clinician: Referring Physician: Treating Physician/Extender: Gabriel Earing in Treatment: 6 Education Assessment Education Provided To: Patient Education Topics Provided Pressure: Handouts: Pressure Injury: Prevention and Offloading, Other:  pressure relief cushions, pressure relief mattress, turning/repositioning every hour Methods: Demonstration, Explain/Verbal Responses: State content correctly Electronic Signature(s) Signed: 05/19/2023 8:04:04 AM By: [name] RN Entered By: [name] on 05/12/2023 13:49:09 -------------------------------------------------------------------------------- Wound Assessment  Details Patient Name: Date of Service: [name] [facility], [name] 05/12/2023 1:30 PM Medical Record Number: [id number] Patient Account Number: [id number] Date of Birth/Sex: Treating RN: Mar 06, 1966 (56 y.o. [name] Primary Care : Aram Beecham Other Clinician: Referring : Treating /Extender: Hermine Messick Weeks in Treatment: 6 Wound Status Wound Number: 5 Primary Pressure Ulcer Etiology: Wound Location: Right, Midline Gluteus Wound Open Wounding Event: Gradually Appeared Status: Date Acquired: 10/04/2021 Comorbid Cataracts, Glaucoma, Coronary Artery Disease, Hypertension, Weeks Of Treatment: 6 History: History of pressure wounds, Osteoarthritis, Paraplegia Clustered Wound: No Photos [place], [name] ([id number]) [id number] Page 8 of 10 Wound Measurements Length: (cm) 6 Width: (cm) 4.5 Depth: (cm) 3.2 Area: (cm) 21.206 Volume: (cm) 67.858 % Reduction in Area: -116% % Reduction in Volume: -360.8% Epithelialization: None Tunneling: No Undermining: No Wound Description Classification: Category/Stage IV Exudate Amount: Large Exudate Type: Serosanguineous Exudate Color: red, brown Foul Odor After Cleansing: No Slough/Fibrino Yes Wound Bed Granulation Amount: Medium (34-66%) Exposed Structure Granulation Quality: Red Fascia Exposed: No Necrotic Amount: Medium (34-66%) Fat Layer (Subcutaneous Tissue) Exposed: Yes Tendon Exposed: No Muscle Exposed: Yes Necrosis of Muscle: No Joint Exposed: No Bone Exposed: Yes Treatment Notes Wound #5 (Gluteus) Wound Laterality: Right, Midline Cleanser Byram Ancillary Kit - 15 Day Supply Discharge Instruction: Use supplies as instructed; Kit contains: (15) Saline Bullets; (15) 3x3 Gauze; 15 pr Gloves Peri-Wound Care Topical Primary Dressing Gauze Discharge Instruction: moistened with Dakins Solution Secondary Dressing (BORDER) Zetuvit Plus SILICONE BORDER Dressing  4x4 (in/in) Discharge Instruction: Please do not put silicone bordered dressings under wraps. Use non-bordered dressing only. Secured With Compression Wrap Compression Stockings Facilities manager) Signed: 05/12/2023 1:46:59 PM By: [name] RN Entered By: [name] on 05/12/2023 13:46:59 [name] ([id number]) [id number] Page 9 of 10 -------------------------------------------------------------------------------- Wound Assessment Details Patient Name: Date of Service: [name] [facility], [name] 05/12/2023 1:30 PM Medical Record Number: [id number] Patient Account Number: [id number] Date of Birth/Sex: Treating RN: 19-Feb-1966 (56 y.o. [name]) [name] Primary Care : Aram Beecham Other Clinician: Referring : Treating /Extender: Hermine Messick Weeks in Treatment: 6 Wound Status Wound Number: 6 Primary Pressure Ulcer Etiology: Wound Location: Right, Lateral Gluteus Wound Open Wounding Event: Gradually Appeared Status: Date Acquired: 10/04/2021 Comorbid Cataracts, Glaucoma, Coronary Artery Disease, Hypertension, Weeks Of Treatment: 6 History: History of pressure wounds, Osteoarthritis, Paraplegia Clustered Wound: No Photos Wound Measurements Length: (cm) 3 Width: (cm) 3.5 Depth: (cm) 1.9 Area: (cm) 8.247 Volume: (cm) 15.669 % Reduction in Area: -311.7% % Reduction in Volume: -3807.5% Epithelialization: None Tunneling: No Undermining: No Wound Description Classification: Category/Stage IV Exudate Amount: Large Exudate Type: Serosanguineous Exudate Color: red, brown Foul Odor After Cleansing: No Slough/Fibrino Yes Wound Bed Granulation Amount: Small (1-33%) Exposed Structure Granulation Quality: Red Fascia Exposed: No Necrotic Amount: Large (67-100%) Fat Layer (Subcutaneous Tissue) Exposed: Yes Necrotic Quality: Adherent Slough Tendon Exposed: No Muscle Exposed: No Joint Exposed:  No Bone Exposed: No Treatment Notes Wound #6 (Gluteus) Wound Laterality: Right, Lateral Cleanser Byram Ancillary Kit - 15 Day Supply Discharge Instruction: Use supplies as instructed; Kit contains: (15) Saline Bullets; (15) 3x3 Gauze; 15 pr Gloves Peri-Wound Care [place], [name] ([id number]) [id number] Page 10 of 10 Topical Primary Dressing Gauze Discharge Instruction: moistened with Dakins Solution Secondary Dressing (BORDER) Zetuvit Plus  SILICONE BORDER Dressing 4x4 (in/in) Discharge Instruction: Please do not put silicone bordered dressings under wraps. Use non-bordered dressing only. Secured With Compression Wrap Compression Stockings Facilities manager) Signed: 05/16/2023 8:17:13 AM By: [name] RN Previous Signature: 05/12/2023 1:47:25 PM Version By: [name] RN Entered By: [name] on 05/16/2023 08:17:13 -------------------------------------------------------------------------------- Vitals Details Patient Name: Date of Service: [name] TH, [name] 05/12/2023 1:30 PM Medical Record Number: [id number] Patient Account Number: [id number] Date of Birth/Sex: Treating RN: 10-24-1965 (56 y.o. [name]) [name] Primary Care : Aram Beecham Other Clinician: Referring : Treating /Extender: Hermine Messick Weeks in Treatment: 6 Vital Signs Time Taken: 13:35 Temperature (F): 97.6 Height (in): 59 Pulse (bpm): 91 Weight (lbs): 90 Respiratory Rate (breaths/min): 16 Body Mass Index (BMI): 18.2 Blood Pressure (mmHg): 78/58 Reference Range: 80 - 120 mg / dl Electronic Signature(s) Signed: 05/12/2023 1:45:31 PM By: [name] RN Entered By: [name] on 05/12/2023 13:45:31

## 2023-05-12 NOTE — ED Notes (Signed)
Attempted x2 for IV. Will try [place] IV

## 2023-05-12 NOTE — ED Triage Notes (Signed)
Pt BIBA from wound care center. Pt there for routine Decub check on his buttocks. Wound care sent him here due to the wound doubling in size with foul smell. Pt finished Cipro 1 week ago. Pt A&Ox4. 91/63 Pt is chronically hypotensive per EMS. HR 90, 99%on RA.

## 2023-05-12 NOTE — Progress Notes (Addendum)
[place], [name] ([id number]) [id number] Page 1 of 10 Visit Report for 05/12/2023 Chief Complaint Document Details Patient Name: Date of Service: [name] [facility], [name] 05/12/2023 1:30 PM Medical Record Number: [id number] Patient Account Number: [id number] Date of Birth/Sex: Treating RN: March 12, 1966 (56 y.o. [name]) [name] Primary Care Provider: Aram Beecham Other Clinician: Referring Provider: Treating Provider/Extender: Gabriel Earing in Treatment: 6 Information Obtained from: Patient Chief Complaint Right ischial tuberosity pressure ulcer Electronic Signature(s) Signed: 05/12/2023 1:45:47 PM By: Allen Derry PA-C Entered By: Allen Derry on 05/12/2023 13:45:47 -------------------------------------------------------------------------------- HPI Details Patient Name: Date of Service: [name] TH, [name] 05/12/2023 1:30 PM Medical Record Number: [id number] Patient Account Number: [id number] Date of Birth/Sex: Treating RN: 1966-06-16 (56 y.o. [name] Primary Care Provider: Aram Beecham Other Clinician: Referring Provider: Treating Provider/Extender: Gabriel Earing in Treatment: 6 History of Present Illness HPI Description: 57 year old male with a history of spina bifida presenting to [place] with a history of a open wound on the left gluteal region near his upper thigh which she's had for several weeks. He was seen in the ED recently at [facility] and was put on doxycycline and was advised some local care. his past medical history is significant for spinal bifid, neurogenic bladder, kidney stones, sacral pressure sores, constipation. Past surgical history significant for partial cystectomy, ileal conduit, VP shunt removal, percutaneous nephro lithotripsy. In the remote past the patient says he's had some treatment for a ulcer on this area and was treated with a skin graft. Readmission: 10/16/2021 upon  evaluation today patient appears to be doing somewhat poorly in regard to a fairly large wound noted over the right ischial tuberosity location. This is a stage IV pressure ulcer. Of note this is also positive for osteomyelitis upon a CT scan which was performed during the time that he was admitted in the hospital on 09/19/2021. With that being said it is noted in the right inferior buttock that he has a decubitus ulcer which extends to the posterior toe inferior right ischium where there is evidence of osteomyelitis. When he was here in the office actually missed that this was positive I missed read it and thought it said that there was no osteomyelitis. That is not the case there is in fact osteomyelitis noted here. I actually did review his notes as well in epic. Looking to the discharge summary it appears that the patient was admitted from 09/19/2021 through 09/23/2021. He was discharged home with home health care according to the note. With that being said from what I understand his sister actually takes care of him at this point. He was also to follow-up with a primary care provider and here at the wound care center within a week. I am just now seeing him on the 13th almost a ROBBIN, STYLES ([id number]) [id number] Page 2 of 10 month after discharge. He does have a history of again osteomyelitis of this right hip/ischial tuberosity location. 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 The reason for his admission was actually worsening of the decubitus ulcer upon admission. He does have chronic osteomyelitis of this area. He was treated with empiric antibiotics he had acute kidney injury which improved with IV fluids he also had hypokalemia. Surgical debridement was performed by general surgery at that time  and ID was  consulted to assist with management according to the notes. IV antibiotics were recommended but patient declined and wanted oral antibiotics at discharge. Therefore no IV antibiotics were planned for discharge time. This case was under the care of Dr. Joylene Draft while he was in the hospital when she did discharge him with a 2-week course of doxycycline and Augmentin according to the notes. It looks like Dr. Judithann Sheen office did call and leave a message for the patient to get scheduled for a follow-up visit after hospital discharge I do not see where he showed up in fact it appears that the patient has a no-show letter from Dr. Judithann Sheen office noted in epic due to a appointment that was missed on 10/06/2021. Looking at the pictures from Dr. Cristine Polio notes on 09/23/2021 it does appear that the wound is a little bit better as far as the cleanliness of the surface of the wound today but nonetheless still is a very significant wound. 10/26/2021 upon evaluation today patient appears to be doing okay in regard to his wound. Again this is a wound that he did indeed have osteomyelitis and previous. With that being said I do not see any signs of a worsening infection at this time which is good news. Overall I think that we are headed in the right direction although still I think he needs more aggressive offloading. 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 I do believe this would be ideal for him to be honest. Readmission: 01-15-2022 upon evaluation today patient presents for reevaluation he was last seen on October 26, 2021. At that time unfortunately he was having issues with a significant ischial pressure ulcer on the right ischial tuberosity. Subsequently he ended up having decent response to the Dakin's moistened gauze dressing that was previously being utilized and subsequently the patient's sister who is his primary caregiver as well states that she decided just to try to manage this  at home. Nonetheless unfortunately this is getting a little larger not better. He does spend a lot of the day in his chair. He does have an air mattress according what they tell me today. That was previously ordered. With that being said I do believe that at this time things do appear to be doing much better from the standpoint of infection I do not see any signs of overt infection. There is also no evidence of systemic infection. No fevers, chills, nausea, vomiting, or diarrhea. With that being said I do believe based on what I am seeing the wound is a bit cleaner and he possibly could be an excellent candidate for a wound VAC. 02-05-2022 upon evaluation today patient appears to be doing about the same in regard to his wound. Unfortunately he is not any better but he also really has not had the wound VAC on sufficiently. There is been some confusion with the orders part of that I think is our follow-up with the way the order was written part of it may be with how the wound VAC is being placed either way we need to see what we can do to try to improve things in this regard. We will clarify the orders today going forward for home health. Unfortunately the patient does have a new wound today which is on the opposite side. This is not good 1 was bad enough have another area to open is definitely not a good thing. Its not as deep but nonetheless is also not  very shallow either. We may potentially be able to get this to the point that we could bridge the 2 together in order to wound VAC both but right now want a focus on to try to get the wound VAC going for the main wound initially we will likely use Hydrofera Blue on the new wound.Marland Kitchen 02-19-2022 upon evaluation today patient's wound appears to be doing a little bit worse in the way of maceration fortunately there is no signs of active infection locally or systemically which is great news. No fevers, chills, nausea, vomiting, or diarrhea. 03-05-2022 upon  evaluation today patient unfortunately does not appear to be doing nearly as good in regard to his wounds as I would like to see. Fortunately I do not see any evidence of active infection that is obvious although I am can obtain a wound culture to ensure that there is not anything getting worse here as far as the wound without the wound VAC on his concern. With that being said I will go ahead and get this sent in and evaluated will initiate treatment as needed going forward if necessary. With regard to the wound with the wound VAC this continues to be placed directly over the wound and there is obvious signs of deep tissue injury. The suction being here means that he is sitting on it I think this may be part of the reason why it is coming off although not 100% certain. 03-12-2022 upon evaluation today patient still has not gotten the antibiotics that were called in for him 4 days ago. He tells me that his sister just told him this morning that they were ready but that she has not had a chance to go pick them up. Nonetheless I feel like that this is really something he needs any should have been taken that not the other antibiotics which were not doing the job for him to be honest. The patient states and I completely agree that there is really no way he would have known relying on his sister to let him know obviously. Nonetheless I do believe that he needs to not be taking any other antibiotics and be on the Levaquin as soon as possible. He tells me that she said she was going to pick them up today. In regard to home health I am hopeful that they actually have been bridging this now. I do think that the wound looks better and this is good news. With that being said I am not certain that I can really tell for sure how this was attached it was removed before he came in and the black foam left in place. I really want him to leave the wound VAC in place until he comes in so that we can monitor and make sure  that this is being done correctly he voiced understanding. 03-19-2022 upon evaluation today and much happier with how things appear as far as the patient's wounds are concerned. I think he is on a much better track and they are doing a better job at bridging although its not quite where I like to see it is still more posterior which I think the patient needs to be a little bit more lateral on his side. Either way this is something that I did marked with a small dot today for him to tell the home health nurses well so she can bridge it to that location also think the tubing should go up instead of going down to just make it less  cumbersome overall for him. 04-19-2022 upon evaluation today patient appears to be doing a little better in regard to his wounds. Fortunately I do not see any signs of infection I do believe he is on the right track. The wound VAC does seem to be helping to clean the wound up a bit and bring it in from the base at work. This is good news. 05-10-2022 upon evaluation today patient appears to be doing well currently in regard to his wounds in general in fact of the smaller of the 2 wounds without the wound VAC actually appears to be doing so much better this is almost completely closed and very pleased in that regard. He has been staying off of this he tells me and shows. With that being said the wound with the wound VAC is going require some sharp debridement there is some tissue which is not quite as healthy and we are going to go ahead and continue that for probably 2 more weeks although I think we may be closer to discontinuing this as well since it has healed and quite significantly. 05-24-2022 upon evaluation today patient appears to be doing well with regard to his wound in fact this looks better than I have been expected. This actually I think is a good time to go ahead and switch away from the wound VAC based on what I am seeing. I think we go to [facility] with a bordered  foam dressing. 06-21-2022 upon evaluation today patient appears to be doing well currently in regard to his wound this is measuring smaller and looks well. Fortunately there does not appear to be any signs of active infection locally or systemically at this time which is great news. No fevers, chills, nausea, vomiting, or diarrhea. 07-29-2022 upon evaluation today patient appears to be doing well currently in regard to his wound. This is actually measuring smaller although there is a lot of hypergranulation noted. Fortunately there does not appear to be any signs of active infection locally or systemically at this time. No fevers, chills, nausea, vomiting, or diarrhea. 11/9; pressure ulcer on the right buttock in the setting of spina bifida and lower extremity paralysis. He has been using [facility] 09-02-2022 upon evaluation today patient appears to be doing well currently in regard to his wound. We have been using Hydrofera Blue this is showing signs of improvement as far as getting smaller is concerned. There is some drainage around the edges of the wound but I think a lot of this is due to the fact he did not even have a dressing on that he came in today. 12/29; right buttock pressure area which was initially stage IV. This is in the setting of a patient with spina bifida spends most of his time in a wheelchair. I am not certain how much he is offloading this. He has been using for [facility] as a primary dressing. When he was here the last visit he was felt to have hyper granulated tissue and was treated with silver nitrate chemical cauterization 10-14-2022 upon evaluation today patient appears to be doing poorly in regard to the wound in the right ischial location. Unfortunately he tells me that the [place], [place] ([id number]) [id number] Page 3 of 10 dressing seems to be falling off frequently. Unfortunately I think that this has contributed to him  developing an infection which appears to be exemplified by the fact that he has bone exposed which is actually necrotic and working its way out of  the wound up and have to perform a fairly significant debridement today it does appear. I think that he needs to have these dressings ensure that they are in place at all times. Also discussed with the patient that we will get a need to do some testing in order to see if he indeed has an infection. If he does have a bone infection which I think is pretty likely to be osteomyelitis of this ischial location. I discussed that with the patient in detail today and depending on what we see him and make any adjustments in care as far as going forward is concerned. Obviously depressed antibiotic that we gave him that he tells me was somewhat irritating as far as the stomach side effects are concerned. He states he really would not want to be on that again. Either way I feel he is probably going to be placed on something once we get the results of the culture back. I am honestly concerned about the fact that he comes into the clinic without any dressings in place I am not sure how often they are really staying where they need to be as far as this wound is concerned. I actually cannot remember seeing him anytime in the past 2 months or so at least that he came in with a dressing intact. 10-21-2022 upon evaluation today patient presents for initial inspection here in our clinic concerning issues that he has been having with a wound in the sacral area. Again last time he was here I did debride away necrotic bone which was confirmed on pathology although it was not necessarily confirmed that this was secondary to osteomyelitis on the pathology report. Also the x-ray did not neither confirm nor deny the presence of osteomyelitis and his culture unfortunately showed multiple organisms with nothing predominance of this does not help to rule and tailor down exactly what is  going on here. Fortunately I do not see any signs of active infection systemically which is great news. With that being said locally I am definitely seeing some issues here. In fact this is warm to touch around the sacral area and I think that cellulitis is definitely present I just cannot pinpoint the exact organism. With that being said I did discuss with the patient as well today that I do believe that he probably needs an MRI in order to further quantify the extent of her likely osteomyelitis and this again is something that we are going to order this will need to be without contrast due to the fact that he is allergic to contrast dye. 11-18-2022 upon evaluation today patient's wound is actually showing some signs of improvement currently. We have been doing the Dakin's moistened gauze dressing which I think is helping to clean up the wound for seeing some granulation growing and still the tissue is not quite as healthy as I would love to see but better than previous. He does have osteomyelitis but the good news is he seems to be treated well with the antibiotics he does have a refill that was just picked up and I think that he needs to continue to take that for certain. 3/7; patient is on a 6-week clinic hiatus. He lives at home with his sister however she is not at home all the time. He transfers out of bed and a sliding board. He claims he is only in the in the wheelchair about 4 5 hours. He does have underlying osteomyelitis I am not sure about the  status of the antibiotics here. Part of the wound still easily probes to bone 12-23-2022 upon evaluation today patient appears to be doing well currently in regard to his wound all things considered there is still a lot of hypergranulation but I do feel like that with the Vashe moistened gauze is actually doing better than he was previously with the Hydrofera Blue. This just did not seem to be staying in place and with the Vashe through able to change  this more frequently. Readmission: 03-31-2023 this is a gentleman whom I have seen multiple times intermittently throughout the years compliance has been a real issue however. I do feel like there is some significant issues here still with the fact that dressing changes and keeping dressings in place are still pretty much as a significant problem here for [place]. I do not see any signs of active infection at this time which is great news but at the same time the wounds do not worse in fact he has another wound different than what I even noted previous. Subsequently he I do believe that the patient should continue to do monitor for any evidence of infection or worsening. Overall I think he needs to have regular follow-up visits with [place] and I think that he needs to have dressings in place at all times. He tells me that he is at home primarily by himself transferring himself from the first thing in the morning through at least 3 in the afternoon as his sister works he really needs somebody gets a caregiver with him throughout the day. 04-12-2023 upon evaluation today patient appears to be doing a little worse currently in regard to his wounds. He has been tolerating the dressing changes without complication. Fortunately I do not see any evidence of active infection locally nor systemically at this point. 04-21-2023 upon evaluation today patient's wound unfortunately appears to have purulent drainage and also there is a malodorous drainage at this point. Unfortunately I think that there is something going on here that does not seem to be doing nearly as good as what we would like to see. I think that he is likely going to require some antibiotics. He has taken Cipro before without complication. 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 On 7/18 he had purulent drainage he had a PCR culture and is now on Cipro he is tolerating this well. Using Hydrofera Blue's a 2 bit and  border foam his sister is changing the dressing After his sister goes to work the patient is apparently in a wheelchair all day. There is no way to heal the wound like this with that degree of prolonged pressure. 05-12-2023 upon evaluation today patient unfortunately appears to be doing much worse than what we previously seen. Last time he was here he was seen by Dr. Leanord Hawking. This was shortly after I put him on Cipro he was tolerating that well. Nonetheless he did not show up last week apparently there was a transportation issue. This week upon arrival this wound appears to be doing significantly worse there is a significant malodorous drainage and this is deeper he has erythema in the gluteal region around it appears to be infected and to be honest despite the Cipro and oral antibiotics he seems to be doing worse not better. Electronic Signature(s) Signed: 05/12/2023 2:04:09 PM By: Allen Derry PA-C Entered By: Allen Derry on 05/12/2023 14:04:09 -------------------------------------------------------------------------------- Physical Exam Details Patient Name: Date of Service: FA  IRCLO TH, [name] 05/12/2023 1:30 PM Medical Record Number: [id number] Patient Account Number: [id number] Date of Birth/Sex: Treating RN: 10/04/1966 (56 y.o. [name]) [name] Primary Care Provider: Aram Beecham Other Clinician: Referring Provider: Treating Provider/Extender: Hermine Messick [place], [place] ([id number]) [id number] Page 4 of 10 Weeks in Treatment: 6 Constitutional Thin and well-hydrated in no acute distress. Respiratory normal breathing without difficulty. Psychiatric this patient is able to make decisions and demonstrates good insight into disease process. Alert and Oriented x 3. pleasant and cooperative. Notes Upon inspection patient's wound bed actually showed signs again of doing significantly worse this is deeper the 2 wounds are pretty much coming  together into 1 and merging. I think that he is not doing too well at all in my opinion. I think that he needs to go to the ER for further evaluation and treatment. I am concerned about osteomyelitis and specifically cellulitis worsening as far as the wounds are concerned and to be honest I think that there is a chance that he could easily become septic based on what we are seeing. Electronic Signature(s) Signed: 05/12/2023 2:04:42 PM By: Allen Derry PA-C Entered By: Allen Derry on 05/12/2023 14:04:42 -------------------------------------------------------------------------------- Physician Orders Details Patient Name: Date of Service: [name] TH, [name] 05/12/2023 1:30 PM Medical Record Number: [id number] Patient Account Number: [id number] Date of Birth/Sex: Treating RN: 1965/12/27 (56 y.o. [name]) [name] Primary Care Provider: Aram Beecham Other Clinician: Referring Provider: Treating Provider/Extender: Gabriel Earing in Treatment: 6 Verbal / Phone Orders: No Diagnosis Coding ICD-10 Coding Code Description L89.314 Pressure ulcer of right buttock, stage 4 Q76.0 Spina bifida occulta Z93.3 Colostomy status I10 Essential (primary) hypertension I25.10 Atherosclerotic heart disease of native coronary artery without angina pectoris Follow-up Appointments Return Appointment in 1 week. Bathing/ [employer] wounds with antibacterial soap and water. Anesthetic (Use 'Patient Medications' Section for Anesthetic Order Entry) Lidocaine applied to wound bed Off-Loading Low air-loss mattress (Group 2) - with hospital bed and trapeze Wound Treatment Wound #5 - Gluteus Wound Laterality: Right, Midline Cleanser: Byram Ancillary Kit - 15 Day Supply (Generic) 3 x Per Week/30 Days Discharge Instructions: Use supplies as instructed; Kit contains: (15) Saline Bullets; (15) 3x3 Gauze; 15 pr Gloves Prim Dressing: Gauze 3 x Per Week/30 Days ary Discharge Instructions:  moistened with Dakins Solution [place], [name] ([id number]) [id number] Page 5 of 10 Secondary Dressing: (BORDER) Zetuvit Plus SILICONE BORDER Dressing 4x4 (in/in) (Generic) 3 x Per Week/30 Days Discharge Instructions: Please do not put silicone bordered dressings under wraps. Use non-bordered dressing only. Wound #6 - Gluteus Wound Laterality: Right, Lateral Cleanser: Byram Ancillary Kit - 15 Day Supply (Generic) 3 x Per Week/30 Days Discharge Instructions: Use supplies as instructed; Kit contains: (15) Saline Bullets; (15) 3x3 Gauze; 15 pr Gloves Prim Dressing: Gauze 3 x Per Week/30 Days ary Discharge Instructions: moistened with Dakins Solution Secondary Dressing: (BORDER) Zetuvit Plus SILICONE BORDER Dressing 4x4 (in/in) (Generic) 3 x Per Week/30 Days Discharge Instructions: Please do not put silicone bordered dressings under wraps. Use non-bordered dressing only. Electronic Signature(s) Signed: 05/12/2023 5:05:18 PM By: Allen Derry PA-C Signed: 05/19/2023 8:04:04 AM By: Chase Pax RN Entered By: [name] on 05/12/2023 15:28:13 -------------------------------------------------------------------------------- Problem List Details Patient Name: Date of Service: [name] TH, [name] 05/12/2023 1:30 PM Medical Record Number: [id number] Patient Account Number: [id number] Date of Birth/Sex: Treating RN: December 17, 1965 (56 y.o. [name] Primary Care Provider: Aram Beecham Other Clinician: Referring Provider: Treating Provider/Extender: Larina Bras,  Margret Chance, Jeffrey Weeks in Treatment: 6 Active Problems ICD-10 Encounter Code Description Active Date MDM Diagnosis L89.314 Pressure ulcer of right buttock, stage 4 03/31/2023 No Yes Q76.0 Spina bifida occulta 03/31/2023 No Yes Z93.3 Colostomy status 03/31/2023 No Yes I10 Essential (primary) hypertension 03/31/2023 No Yes I25.10 Atherosclerotic heart disease of native coronary artery without angina pectoris  03/31/2023 No Yes Inactive Problems Resolved Problems SHANDEN, PAAR ([id number]) [id number] Page 6 of 10 Electronic Signature(s) Signed: 05/12/2023 1:45:44 PM By: Allen Derry PA-C Entered By: Allen Derry on 05/12/2023 13:45:44 -------------------------------------------------------------------------------- Progress Note Details Patient Name: Date of Service: FA Lacy Duverney [facility], [name] 05/12/2023 1:30 PM Medical Record Number: [id number] Patient Account Number: [id number] Date of Birth/Sex: Treating RN: 04/25/1966 (56 y.o. [name]) [name] Primary Care Provider: Aram Beecham Other Clinician: Referring Provider: Treating Provider/Extender: Gabriel Earing in Treatment: 6 Subjective Chief Complaint Information obtained from Patient Right ischial tuberosity pressure ulcer History of Present Illness (HPI) 57 year old male with a history of spina bifida presenting to [place] with a history of a open wound on the left gluteal region near his upper thigh which she's had for several weeks. He was seen in the ED recently at [facility] and was put on doxycycline and was advised some local care. his past medical history is significant for spinal bifid, neurogenic bladder, kidney stones, sacral pressure sores, constipation. Past surgical history significant for partial cystectomy, ileal conduit, VP shunt removal, percutaneous nephro lithotripsy. In the remote past the patient says he's had some treatment for a ulcer on this area and was treated with a skin graft. Readmission: 10/16/2021 upon evaluation today patient appears to be doing somewhat poorly in regard to a fairly large wound noted over the right ischial tuberosity location. This is a stage IV pressure ulcer. Of note this is also positive for osteomyelitis upon a CT scan which was performed during the time that he was admitted in the hospital on 09/19/2021. With that being said it is noted in  the right inferior buttock that he has a decubitus ulcer which extends to the posterior toe inferior right ischium where there is evidence of osteomyelitis. When he was here in the office actually missed that this was positive I missed read it and thought it said that there was no osteomyelitis. That is not the case there is in fact osteomyelitis noted here. I actually did review his notes as well in epic. Looking to the discharge summary it appears that the patient was admitted from 09/19/2021 through 09/23/2021. He was discharged home with home health care according to the note. With that being said from what I understand his sister actually takes care of him at this point. He was also to follow-up with a primary care provider and here at the wound care center within a week. I am just now seeing him on the 13th almost a month after discharge. He does have a history of again osteomyelitis of this right hip/ischial tuberosity location. 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 The reason for his admission was actually worsening of the decubitus ulcer upon admission. He does have chronic osteomyelitis of this area. He was treated with empiric antibiotics he had acute kidney injury which improved with IV fluids he also had hypokalemia. Surgical debridement was performed by general surgery at that time  and ID was consulted to assist with management according to the notes. IV antibiotics were recommended but patient declined and wanted oral antibiotics at discharge. Therefore no IV antibiotics were planned for discharge time. This case was under the care of Dr. Joylene Draft while he was in the hospital when she did discharge him with a 2-week course of doxycycline and Augmentin according to the notes. It looks like Dr. Judithann Sheen office did call and leave a  message for the patient to get scheduled for a follow-up visit after hospital discharge I do not see where he showed up in fact it appears that the patient has a no-show letter from Dr. Judithann Sheen office noted in epic due to a appointment that was missed on 10/06/2021. Looking at the pictures from Dr. Cristine Polio notes on 09/23/2021 it does appear that the wound is a little bit better as far as the cleanliness of the surface of the wound today but nonetheless still is a very significant wound. 10/26/2021 upon evaluation today patient appears to be doing okay in regard to his wound. Again this is a wound that he did indeed have osteomyelitis and previous. With that being said I do not see any signs of a worsening infection at this time which is good news. Overall I think that we are headed in the right direction although still I think he needs more aggressive offloading. 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 I do believe this would be ideal for him to be honest. Readmission: 01-15-2022 upon evaluation today patient presents for reevaluation he was last seen on October 26, 2021. At that time unfortunately he was having issues with a significant ischial pressure ulcer on the right ischial tuberosity. Subsequently he ended up having decent response to the Dakin's moistened gauze dressing that was previously being utilized and subsequently the patient's sister who is his primary caregiver as well states that she decided just to try to manage this at home. Nonetheless unfortunately this is getting a little larger not better. He does spend a lot of the day in his chair. He does have an air mattress according what they tell me today. That was previously ordered. With that being said I do believe that at this time things do appear to be doing much better from the standpoint of infection I do not see any signs of overt infection. There is also no evidence of systemic infection. No fevers,  chills, nausea, vomiting, or diarrhea. With that being said I do believe based on what I am seeing the wound is a bit cleaner and he possibly could be an excellent candidate for a wound VAC. 02-05-2022 upon evaluation today patient appears to be doing about the same in regard to his wound. Unfortunately he is not any better but he also really has not had the wound VAC on sufficiently. There is been some confusion with the orders part of that I think is our follow-up with the way the order was written part of it may be with how the wound VAC is being placed either way we need to see what we can do to try to improve things in this regard. We will clarify the orders today going forward for home health. [place], [name] ([id number]) [id number] Page 7 of 10 Unfortunately the patient does have a new wound today which is on the opposite side. This is not good 1 was bad enough have another area to open is definitely not a good thing. Its  not as deep but nonetheless is also not very shallow either. We may potentially be able to get this to the point that we could bridge the 2 together in order to wound VAC both but right now want a focus on to try to get the wound VAC going for the main wound initially we will likely use Hydrofera Blue on the new wound.Marland Kitchen 02-19-2022 upon evaluation today patient's wound appears to be doing a little bit worse in the way of maceration fortunately there is no signs of active infection locally or systemically which is great news. No fevers, chills, nausea, vomiting, or diarrhea. 03-05-2022 upon evaluation today patient unfortunately does not appear to be doing nearly as good in regard to his wounds as I would like to see. Fortunately I do not see any evidence of active infection that is obvious although I am can obtain a wound culture to ensure that there is not anything getting worse here as far as the wound without the wound VAC on his concern. With that  being said I will go ahead and get this sent in and evaluated will initiate treatment as needed going forward if necessary. With regard to the wound with the wound VAC this continues to be placed directly over the wound and there is obvious signs of deep tissue injury. The suction being here means that he is sitting on it I think this may be part of the reason why it is coming off although not 100% certain. 03-12-2022 upon evaluation today patient still has not gotten the antibiotics that were called in for him 4 days ago. He tells me that his sister just told him this morning that they were ready but that she has not had a chance to go pick them up. Nonetheless I feel like that this is really something he needs any should have been taken that not the other antibiotics which were not doing the job for him to be honest. The patient states and I completely agree that there is really no way he would have known relying on his sister to let him know obviously. Nonetheless I do believe that he needs to not be taking any other antibiotics and be on the Levaquin as soon as possible. He tells me that she said she was going to pick them up today. In regard to home health I am hopeful that they actually have been bridging this now. I do think that the wound looks better and this is good news. With that being said I am not certain that I can really tell for sure how this was attached it was removed before he came in and the black foam left in place. I really want him to leave the wound VAC in place until he comes in so that we can monitor and make sure that this is being done correctly he voiced understanding. 03-19-2022 upon evaluation today and much happier with how things appear as far as the patient's wounds are concerned. I think he is on a much better track and they are doing a better job at bridging although its not quite where I like to see it is still more posterior which I think the patient needs to be a  little bit more lateral on his side. Either way this is something that I did marked with a small dot today for him to tell the home health nurses well so she can bridge it to that location also think the tubing should go up instead  of going down to just make it less cumbersome overall for him. 04-19-2022 upon evaluation today patient appears to be doing a little better in regard to his wounds. Fortunately I do not see any signs of infection I do believe he is on the right track. The wound VAC does seem to be helping to clean the wound up a bit and bring it in from the base at work. This is good news. 05-10-2022 upon evaluation today patient appears to be doing well currently in regard to his wounds in general in fact of the smaller of the 2 wounds without the wound VAC actually appears to be doing so much better this is almost completely closed and very pleased in that regard. He has been staying off of this he tells me and shows. With that being said the wound with the wound VAC is going require some sharp debridement there is some tissue which is not quite as healthy and we are going to go ahead and continue that for probably 2 more weeks although I think we may be closer to discontinuing this as well since it has healed and quite significantly. 05-24-2022 upon evaluation today patient appears to be doing well with regard to his wound in fact this looks better than I have been expected. This actually I think is a good time to go ahead and switch away from the wound VAC based on what I am seeing. I think we go to [facility] with a bordered foam dressing. 06-21-2022 upon evaluation today patient appears to be doing well currently in regard to his wound this is measuring smaller and looks well. Fortunately there does not appear to be any signs of active infection locally or systemically at this time which is great news. No fevers, chills, nausea, vomiting, or diarrhea. 07-29-2022 upon evaluation today  patient appears to be doing well currently in regard to his wound. This is actually measuring smaller although there is a lot of hypergranulation noted. Fortunately there does not appear to be any signs of active infection locally or systemically at this time. No fevers, chills, nausea, vomiting, or diarrhea. 11/9; pressure ulcer on the right buttock in the setting of spina bifida and lower extremity paralysis. He has been using [facility] 09-02-2022 upon evaluation today patient appears to be doing well currently in regard to his wound. We have been using Hydrofera Blue this is showing signs of improvement as far as getting smaller is concerned. There is some drainage around the edges of the wound but I think a lot of this is due to the fact he did not even have a dressing on that he came in today. 12/29; right buttock pressure area which was initially stage IV. This is in the setting of a patient with spina bifida spends most of his time in a wheelchair. I am not certain how much he is offloading this. He has been using for [facility] as a primary dressing. When he was here the last visit he was felt to have hyper granulated tissue and was treated with silver nitrate chemical cauterization 10-14-2022 upon evaluation today patient appears to be doing poorly in regard to the wound in the right ischial location. Unfortunately he tells me that the dressing seems to be falling off frequently. Unfortunately I think that this has contributed to him developing an infection which appears to be exemplified by the fact that he has bone exposed which is actually necrotic and working its way out of the  wound up and have to perform a fairly significant debridement today it does appear. I think that he needs to have these dressings ensure that they are in place at all times. Also discussed with the patient that we will get a need to do some testing in order to see if he indeed has an infection. If he does  have a bone infection which I think is pretty likely to be osteomyelitis of this ischial location. I discussed that with the patient in detail today and depending on what we see him and make any adjustments in care as far as going forward is concerned. Obviously depressed antibiotic that we gave him that he tells me was somewhat irritating as far as the stomach side effects are concerned. He states he really would not want to be on that again. Either way I feel he is probably going to be placed on something once we get the results of the culture back. I am honestly concerned about the fact that he comes into the clinic without any dressings in place I am not sure how often they are really staying where they need to be as far as this wound is concerned. I actually cannot remember seeing him anytime in the past 2 months or so at least that he came in with a dressing intact. 10-21-2022 upon evaluation today patient presents for initial inspection here in our clinic concerning issues that he has been having with a wound in the sacral area. Again last time he was here I did debride away necrotic bone which was confirmed on pathology although it was not necessarily confirmed that this was secondary to osteomyelitis on the pathology report. Also the x-ray did not neither confirm nor deny the presence of osteomyelitis and his culture unfortunately showed multiple organisms with nothing predominance of this does not help to rule and tailor down exactly what is going on here. Fortunately I do not see any signs of active infection systemically which is great news. With that being said locally I am definitely seeing some issues here. In fact this is warm to touch around the sacral area and I think that cellulitis is definitely present I just cannot pinpoint the exact organism. With that being said I did discuss with the patient as well today that I do believe that he probably needs an MRI in order to further  quantify the extent of her likely osteomyelitis and this again is something that we are going to order this will need to be without contrast due to the fact that he is allergic to contrast dye. 11-18-2022 upon evaluation today patient's wound is actually showing some signs of improvement currently. We have been doing the Dakin's moistened gauze dressing which I think is helping to clean up the wound for seeing some granulation growing and still the tissue is not quite as healthy as I would love to see but better than previous. He does have osteomyelitis but the good news is he seems to be treated well with the antibiotics he does have a refill that was just picked up and I think that he needs to continue to take that for certain. 3/7; patient is on a 6-week clinic hiatus. He lives at home with his sister however she is not at home all the time. He transfers out of bed and a sliding board. He claims he is only in the in the wheelchair about 4 5 hours. He does have underlying osteomyelitis I am not sure about the  status of the antibiotics here. Part of the wound still easily probes to bone 12-23-2022 upon evaluation today patient appears to be doing well currently in regard to his wound all things considered there is still a lot of hypergranulation but I do feel like that with the Vashe moistened gauze is actually doing better than he was previously with the Hydrofera Blue. This just did not seem to be staying in place and with the Vashe through able to change this more frequently. [place], [name] ([id number]) [id number] Page 8 of 10 Readmission: 03-31-2023 this is a gentleman whom I have seen multiple times intermittently throughout the years compliance has been a real issue however. I do feel like there is some significant issues here still with the fact that dressing changes and keeping dressings in place are still pretty much as a significant problem here for [place]. I do not  see any signs of active infection at this time which is great news but at the same time the wounds do not worse in fact he has another wound different than what I even noted previous. Subsequently he I do believe that the patient should continue to do monitor for any evidence of infection or worsening. Overall I think he needs to have regular follow-up visits with [place] and I think that he needs to have dressings in place at all times. He tells me that he is at home primarily by himself transferring himself from the first thing in the morning through at least 3 in the afternoon as his sister works he really needs somebody gets a caregiver with him throughout the day. 04-12-2023 upon evaluation today patient appears to be doing a little worse currently in regard to his wounds. He has been tolerating the dressing changes without complication. Fortunately I do not see any evidence of active infection locally nor systemically at this point. 04-21-2023 upon evaluation today patient's wound unfortunately appears to have purulent drainage and also there is a malodorous drainage at this point. Unfortunately I think that there is something going on here that does not seem to be doing nearly as good as what we would like to see. I think that he is likely going to require some antibiotics. He has taken Cipro before without complication. 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 On 7/18 he had purulent drainage he had a PCR culture and is now on Cipro he is tolerating this well. Using Hydrofera Blue's a 2 bit and border foam his sister is changing the dressing After his sister goes to work the patient is apparently in a wheelchair all day. There is no way to heal the wound like this with that degree of prolonged pressure. 05-12-2023 upon evaluation today patient unfortunately appears to be doing much worse than what we previously seen. Last time he was here he was seen by Dr. Leanord Hawking.  This was shortly after I put him on Cipro he was tolerating that well. Nonetheless he did not show up last week apparently there was a transportation issue. This week upon arrival this wound appears to be doing significantly worse there is a significant malodorous drainage and this is deeper he has erythema in the gluteal region around it appears to be infected and to be honest despite the Cipro and oral antibiotics he seems to be doing worse not better. Objective Constitutional Thin and well-hydrated in no acute distress. Vitals Time Taken: 1:35 PM, Height: 59 in, Weight: 90  lbs, BMI: 18.2, Temperature: 97.6 F, Pulse: 91 bpm, Respiratory Rate: 16 breaths/min, Blood Pressure: 78/58 mmHg. Respiratory normal breathing without difficulty. Psychiatric this patient is able to make decisions and demonstrates good insight into disease process. Alert and Oriented x 3. pleasant and cooperative. General Notes: Upon inspection patient's wound bed actually showed signs again of doing significantly worse this is deeper the 2 wounds are pretty much coming together into 1 and merging. I think that he is not doing too well at all in my opinion. I think that he needs to go to the ER for further evaluation and treatment. I am concerned about osteomyelitis and specifically cellulitis worsening as far as the wounds are concerned and to be honest I think that there is a chance that he could easily become septic based on what we are seeing. Integumentary (Hair, Skin) Wound #5 status is Open. Original cause of wound was Gradually Appeared. The date acquired was: 10/04/2021. The wound has been in treatment 6 weeks. The wound is located on the Right,Midline Gluteus. The wound measures 6cm length x 4.5cm width x 3.2cm depth; 21.206cm^2 area and 67.858cm^3 volume. There is bone, muscle, and Fat Layer (Subcutaneous Tissue) exposed. There is no tunneling or undermining noted. There is a large amount of  serosanguineous drainage noted. There is medium (34-66%) red granulation within the wound bed. There is a medium (34-66%) amount of necrotic tissue within the wound bed. Wound #6 status is Open. Original cause of wound was Gradually Appeared. The date acquired was: 10/04/2021. The wound has been in treatment 6 weeks. The wound is located on the Right,Lateral Gluteus. The wound measures 3cm length x 3.5cm width x 1.9cm depth; 8.247cm^2 area and 15.669cm^3 volume. There is Fat Layer (Subcutaneous Tissue) exposed. There is no tunneling or undermining noted. There is a large amount of serosanguineous drainage noted. There is small (1-33%) red granulation within the wound bed. There is a large (67-100%) amount of necrotic tissue within the wound bed including Adherent Slough. Assessment Active Problems ICD-10 Pressure ulcer of right buttock, stage 4 Spina bifida occulta Colostomy status Essential (primary) hypertension Atherosclerotic heart disease of native coronary artery without angina pectoris Plan [place], [name] ([id number]) [id number] Page 9 of 10 Follow-up Appointments: Return Appointment in 1 week. Bathing/ Shower/ Hygiene: Wash wounds with antibacterial soap and water. Anesthetic (Use 'Patient Medications' Section for Anesthetic Order Entry): Lidocaine applied to wound bed WOUND #5: - Gluteus Wound Laterality: Right, Midline Cleanser: Byram Ancillary Kit - 15 Day Supply (Generic) 3 x Per Week/30 Days Discharge Instructions: Use supplies as instructed; Kit contains: (15) Saline Bullets; (15) 3x3 Gauze; 15 pr Gloves Prim Dressing: Gauze 3 x Per Week/30 Days ary Discharge Instructions: moistened with Dakins Solution Secondary Dressing: (BORDER) Zetuvit Plus SILICONE BORDER Dressing 4x4 (in/in) (Generic) 3 x Per Week/30 Days Discharge Instructions: Please do not put silicone bordered dressings under wraps. Use non-bordered dressing only. WOUND #6: - Gluteus  Wound Laterality: Right, Lateral Cleanser: Byram Ancillary Kit - 15 Day Supply (Generic) 3 x Per Week/30 Days Discharge Instructions: Use supplies as instructed; Kit contains: (15) Saline Bullets; (15) 3x3 Gauze; 15 pr Gloves Prim Dressing: Gauze 3 x Per Week/30 Days ary Discharge Instructions: moistened with Dakins Solution Secondary Dressing: (BORDER) Zetuvit Plus SILICONE BORDER Dressing 4x4 (in/in) (Generic) 3 x Per Week/30 Days Discharge Instructions: Please do not put silicone bordered dressings under wraps. Use non-bordered dressing only. 1. I do believe that the patient is doing much worse despite the Cipro treatment that I  previously undertook for him. I think that he needs to likely be seen in the ER for further evaluation at this point. Again the previous culture that we did showed Proteus as one of the primary organisms of concern. And that continues to be something that I do worry about but again I am concerned that this might be a deeper issue with osteomyelitis being a possibility I am also concerned about the possibility of becoming septic. 2. I did recommend the patient should go to the ER for further evaluation and treatment as this wound seems to be significantly worse despite outpatient management and oral antibiotic therapy he did come in today again with no dressing in place telling me "the dressing tape will not stick". We will see patient back for reevaluation in 1 week here in the clinic. If anything worsens or changes patient will contact our office for additional recommendations. This of course will be dependent on the ER evaluation. Electronic Signature(s) Signed: 05/12/2023 2:05:59 PM By: Allen Derry PA-C Entered By: Allen Derry on 05/12/2023 14:05:58 -------------------------------------------------------------------------------- SuperBill Details Patient Name: Date of Service: [name] TH, [name] 05/12/2023 Medical Record Number: [id number] Patient Account Number:  [id number] Date of Birth/Sex: Treating RN: [birth year] (56 y.o. [name]) [name] Primary Care Provider: Aram Beecham Other Clinician: Referring Provider: Treating Provider/Extender: Hermine Messick Weeks in Treatment: 6 Diagnosis Coding ICD-10 Codes Code Description L89.314 Pressure ulcer of right buttock, stage 4 Q76.0 Spina bifida occulta Z93.3 Colostomy status I10 Essential (primary) hypertension I25.10 Atherosclerotic heart disease of native coronary artery without angina pectoris Facility Procedures : CPT4 Code: 53664403 Description: 47425 - WOUND CARE VISIT-LEV 3 EST PT Modifier: Quantity: 1 Physician Procedures BUSTER, YOHO ([id number]): CPT4 Code Description [id number] [phone] - WC PHYS LEVEL 5 - EST PT ICD-10 Diagnosis Description L89.314 Pressure ulcer of right buttock, stage 4 Q76.0 Spina bifida occulta Z93.3 Colostomy status I10 Essential (primary)  hypertension [id number] Page 10 of 10: Quantity Modifier 1 Electronic Signature(s) Signed: 05/12/2023 2:06:29 PM By: Allen Derry PA-C Entered By: Allen Derry on 05/12/2023 14:06:29

## 2023-05-12 NOTE — ED Provider Notes (Signed)
[facility] Provider Note    Event Date/Time   First MD Initiated Contact with Patient 05/12/23 1503     (approximate)   History   Chief Complaint Wound Infection   HPI  [name] is a 57 y.o. male with past medical history of hypertension, hyperlipidemia, spina bifida, ileal conduit, and colostomy who presents to the ED complaining of wound infection.  Patient reports that he has been following regularly with the wound care clinic for large ulcer over his right ischial tuberosity.  He was started on Cipro 2 weeks ago due to concern for increasing size of wound along with evidence of infection.  He has taken this as prescribed and followed up with wound care earlier today, when wound noted to be even larger with ongoing signs of infection.  He was referred to the ED for further evaluation and admission for IV antibiotics.  Patient states that he has otherwise felt well, denies any fevers, cough, chest pain, shortness of breath, dizziness, or lightheadedness.  He states he does have a hard time taking weight off of his right side because if he does it causes his urostomy bag to leak.     Physical Exam   Triage Vital Signs: ED Triage Vitals  Encounter Vitals Group     BP 05/12/23 1451 95/65     Systolic BP Percentile --      Diastolic BP Percentile --      Pulse Rate 05/12/23 1451 88     Resp 05/12/23 1451 18     Temp 05/12/23 1451 99 F (37.2 C)     Temp Source 05/12/23 1451 Axillary     SpO2 05/12/23 1445 99 %     Weight --      Height --      Head Circumference --      Peak Flow --      Pain Score --      Pain Loc --      Pain Education --      Exclude from Growth Chart --     Most recent vital signs: Vitals:   05/12/23 1451 05/12/23 1517  BP: 95/65 90/64  Pulse: 88 89  Resp: 18 16  Temp: 99 F (37.2 C) 99.5 F (37.5 C)  SpO2: 100% 100%    Constitutional: Alert and oriented. Eyes: Conjunctivae are normal. Head:  Atraumatic. Nose: No congestion/rhinnorhea. Mouth/Throat: Mucous membranes are moist.  Cardiovascular: Normal rate, regular rhythm. Grossly normal heart sounds.  2+ radial pulses bilaterally. Respiratory: Normal respiratory effort.  No retractions. Lungs CTAB. Gastrointestinal: Soft and nontender. No distention.  Colostomy and urostomy bag in place. Musculoskeletal: Large and deep ulcer to right ischial tuberosity with surrounding erythema and warmth, no purulent drainage noted and no obvious exposed bone. Neurologic:  Normal speech and language.  Paraplegia noted.    ED Results / Procedures / Treatments   Labs (all labs ordered are listed, but only abnormal results are displayed) Labs Reviewed  CBC WITH DIFFERENTIAL/PLATELET - Abnormal; Notable for the following components:      Result Value   Hemoglobin 12.8 (*)    MCV 78.6 (*)    MCH 23.7 (*)    RDW 16.4 (*)    Platelets 599 (*)    All other components within normal limits  COMPREHENSIVE METABOLIC PANEL - Abnormal; Notable for the following components:   Potassium 2.2 (*)    Chloride 96 (*)    BUN 31 (*)  Creatinine, Ser 1.59 (*)    Total Protein 9.1 (*)    Albumin 3.3 (*)    GFR, Estimated 51 (*)    All other components within normal limits  CULTURE, BLOOD (ROUTINE X 2)  CULTURE, BLOOD (ROUTINE X 2)  LACTIC ACID, PLASMA  LACTIC ACID, PLASMA  MAGNESIUM    RADIOLOGY CT imaging reviewed and interpreted by me with large ulceration in the area of the right ischial tuberosity.  PROCEDURES:  Critical Care performed: No  Procedures   MEDICATIONS ORDERED IN ED: Medications  piperacillin-tazobactam (ZOSYN) IVPB 3.375 g (3.375 g Intravenous New Bag/Given 05/12/23 1709)  vancomycin (VANCOCIN) IVPB 1000 mg/200 mL premix (1,000 mg Intravenous New Bag/Given 05/12/23 1713)  potassium chloride SA (KLOR-CON M) CR tablet 40 mEq (has no administration in time range)  potassium chloride 10 mEq in 100 mL IVPB (has no administration  in time range)  lactated ringers bolus 1,000 mL (1,000 mLs Intravenous New Bag/Given 05/12/23 1703)     IMPRESSION / MDM / ASSESSMENT AND PLAN / ED COURSE  I reviewed the triage vital signs and the nursing notes.                              57 y.o. male with past medical history of hypertension, hyperlipidemia, spina bifida, ileal conduit diversion, and colostomy who presents to the ED complaining of enlarging right ischial ulcer with surrounding erythema and warmth noted at his wound care clinic.  Patient's presentation is most consistent with acute presentation with potential threat to life or bodily function.  Differential diagnosis includes, but is not limited to, osteomyelitis, cellulitis, abscess, sepsis.  Patient chronically ill but nontoxic-appearing and in no acute distress, vital signs remarkable for borderline fever at 99.5 along with borderline hypotension at 90/64.  Patient does state that he deals with chronic hypotension, will hydrate with IV fluids and further assess for sepsis with blood cultures, lactic acid, and additional labs.  We will check CT of his pelvis for evidence of abscess or osteomyelitis, anticipate admission for IV antibiotics.  CT imaging concerning for large ulcer around the right ischial tuberosity with bony destruction concerning for acute on chronic osteomyelitis.  Patient started on IV vancomycin and Zosyn for this, blood cultures pending and lactic acid within normal limits.  No significant anemia or leukocytosis noted, patient does have mild AKI with significant hypokalemia.  No associated EKG changes noted, we will replete potassium and add on magnesium level.  Case discussed with hospitalist for admission.      FINAL CLINICAL IMPRESSION(S) / ED DIAGNOSES   Final diagnoses:  Acute osteomyelitis of right pelvic region (HCC)  Pressure injury of skin of right buttock, unspecified injury stage     Rx / DC Orders   ED Discharge Orders     None         Note:  This document was prepared using Dragon voice recognition software and may include unintentional dictation errors.   Chesley Noon, MD 05/12/23 [phone]

## 2023-05-12 NOTE — Consult Note (Signed)
Pharmacy Antibiotic Note  [name] is a 57 y.o. male with PMH significant for spina bifida admitted on 05/12/2023 for evaluation of increased drainage and size of known sacral ulcer. Patient follows with wound care clinic and was directed to the ED by the wound care physician when the wound was evaluated and noted to be substantially larger and have foul-smelling drainage Pharmacy has been consulted for Vancomycin and Zosyn dosing.  Plan: 1) Vancomycin 1000 mg IV x 1 as loading dose, followed by: 1000 mg IV Q 48 hrs. Goal AUC 400-550. Expected AUC: 486 SCr used: 1.59, Vd used: 0.72   2) Zosyn 3.375g IV q8h (4 hour infusion).  Will continue to monitor and de-escalate/adjust dosing as appropriate.  Height: 4\' 6"  (137.2 cm) Weight: 53.5 kg (118 lb) IBW/kg (Calculated) : 36.2  Temp (24hrs), Avg:99.3 F (37.4 C), Min:99 F (37.2 C), Max:99.5 F (37.5 C)  Recent Labs  Lab 05/12/23 1626  WBC 9.4  CREATININE 1.59*  LATICACIDVEN 1.8    Estimated Creatinine Clearance: 32 mL/min (A) (by C-G formula based on SCr of 1.59 mg/dL (H)).    Allergies  Allergen Reactions   Morphine And Codeine Nausea And Vomiting   Sulfa Antibiotics Other (See Comments)    Reaction: unknown   Ivp Dye [Iodinated Contrast Media] Hives   Latex Rash    Antimicrobials this admission: Vancomycin 8/8 >>  Zosyn 8/8 >>   Dose adjustments this admission: N/A  Microbiology results: 8/8 BCx: collected   Thank you for allowing pharmacy to be a part of this patient's care.  Bettey Costa 05/12/2023 6:26 PM

## 2023-05-12 NOTE — ED Notes (Signed)
Pt to CT

## 2023-05-13 DIAGNOSIS — M86151 Other acute osteomyelitis, right femur: Secondary | ICD-10-CM | POA: Diagnosis not present

## 2023-05-13 DIAGNOSIS — Z515 Encounter for palliative care: Secondary | ICD-10-CM | POA: Diagnosis not present

## 2023-05-13 DIAGNOSIS — L89319 Pressure ulcer of right buttock, unspecified stage: Secondary | ICD-10-CM

## 2023-05-13 DIAGNOSIS — T148XXA Other injury of unspecified body region, initial encounter: Secondary | ICD-10-CM | POA: Diagnosis not present

## 2023-05-13 DIAGNOSIS — L089 Local infection of the skin and subcutaneous tissue, unspecified: Secondary | ICD-10-CM | POA: Diagnosis not present

## 2023-05-13 LAB — AEROBIC CULTURE W GRAM STAIN (SUPERFICIAL SPECIMEN)

## 2023-05-13 MED ORDER — ZINC SULFATE 220 (50 ZN) MG PO CAPS
220.0000 mg | ORAL_CAPSULE | Freq: Every day | ORAL | Status: DC
  Administered 2023-05-14 – 2023-05-17 (×4): 220 mg via ORAL
  Filled 2023-05-13 (×4): qty 1

## 2023-05-13 MED ORDER — POTASSIUM CHLORIDE CRYS ER 20 MEQ PO TBCR
40.0000 meq | EXTENDED_RELEASE_TABLET | ORAL | Status: AC
  Administered 2023-05-13: 40 meq via ORAL
  Filled 2023-05-13: qty 2

## 2023-05-13 MED ORDER — POTASSIUM CHLORIDE CRYS ER 20 MEQ PO TBCR
40.0000 meq | EXTENDED_RELEASE_TABLET | Freq: Once | ORAL | Status: AC
  Administered 2023-05-13: 40 meq via ORAL
  Filled 2023-05-13: qty 2

## 2023-05-13 MED ORDER — ALBUMIN HUMAN 25 % IV SOLN
12.5000 g | Freq: Once | INTRAVENOUS | Status: AC
  Administered 2023-05-13: 12.5 g via INTRAVENOUS
  Filled 2023-05-13: qty 50

## 2023-05-13 MED ORDER — VITAMIN C 500 MG PO TABS
500.0000 mg | ORAL_TABLET | Freq: Two times a day (BID) | ORAL | Status: DC
  Administered 2023-05-14 – 2023-05-17 (×7): 500 mg via ORAL
  Filled 2023-05-13 (×7): qty 1

## 2023-05-13 MED ORDER — SIMETHICONE 80 MG PO CHEW
80.0000 mg | CHEWABLE_TABLET | Freq: Four times a day (QID) | ORAL | Status: DC
  Administered 2023-05-13 – 2023-05-17 (×17): 80 mg via ORAL
  Filled 2023-05-13 (×17): qty 1

## 2023-05-13 MED ORDER — DAKINS (1/4 STRENGTH) 0.125 % EX SOLN
CUTANEOUS | Status: AC
  Filled 2023-05-13: qty 473

## 2023-05-13 MED ORDER — BOOST PLUS PO LIQD
237.0000 mL | Freq: Three times a day (TID) | ORAL | Status: DC
  Administered 2023-05-15 – 2023-05-17 (×6): 237 mL via ORAL
  Filled 2023-05-13: qty 237

## 2023-05-13 NOTE — Progress Notes (Signed)
Initial Nutrition Assessment  DOCUMENTATION CODES:   Not applicable  INTERVENTION:   Boost Plus chocolate po TID- each supplement provides 360kcal and 14g protein.    Magic cup TID with meals, each supplement provides 290 kcal and 9 grams of protein  MVI po daily   Vitamin C 500mg  po BID  Zinc 220mg  po daily x 14 days  Check vitamins E, A, D, C, zinc and copper.   NUTRITION DIAGNOSIS:   Increased nutrient needs related to wound healing as evidenced by estimated needs.  GOAL:   Patient will meet greater than or equal to 90% of their needs  MONITOR:   Supplement acceptance, PO intake, Labs, Weight trends, I & O's, Skin  REASON FOR ASSESSMENT:   Consult Assessment of nutrition requirement/status  ASSESSMENT:   57 y.o. male with history of spina bifida s/p closure surgery, VP shunt (removed), chronic back pain, seizures (90's), PUD s/p partial gastrectomy (per pt report), neurogenic bladder (s/p ileal conduit), nephrolithiasis with bilateral staghorn stones, chronic bilateral hydronephrosis, chronic bowel dysmotility (s/p loop colostomy 05/02/2017) complicated by fecal impaction (s/p robotic assisted low anterior resection with revision of colostomy (end colostomy), revision of urinary-cutaneous anastomosis and fecal disimpaction 11/2017), duodenal and esophageal ulcers, hypertension, hyperlipidemia, GERD, CKD stage III, AAA and recurrent urinary tract infections who is admitted with non healing decubitis ulcers with sacral osteomyelitis.  Met with pt in room today. Pt is well known to this RD from numerous previous admissions. Pt reports good appetite and oral intake pta. Pt ate 65% of his breakfast this morning; pt has ordered pot roast for lunch. Pt with h/o severe malnutrition and at one point weighed ~75lbs. Pt moved in with his sister several years ago and regained ~40-45lbs. Today, pt appears weight stable for the past several years. Pt reports that he continues to drink  chocolate Boost at home; pt does not like Ensure. RD will add supplements and vitamins to help pt meet his estimated needs and to support wound healing. RD will check vitamins labs important for wound healing to r/o deficiency.   Medications reviewed and include: lovenox, folic acid, protonix, simethicone, carafate, thiamine, LRS @75ml /hr, zosyn, vancomycin   Labs reviewed: K 3.0 wnl, BUN 25(H), creat 1.53(H) Hgb 10.1(L), Hct 32.5(L), MCV 76.3(L), MCH 23.7(L)  NUTRITION - FOCUSED PHYSICAL EXAM:  Flowsheet Row Most Recent Value  Orbital Region No depletion  Upper Arm Region No depletion  Thoracic and Lumbar Region No depletion  Buccal Region No depletion  Temple Region No depletion  Clavicle Bone Region Mild depletion  Clavicle and Acromion Bone Region Mild depletion  Scapular Bone Region No depletion  Dorsal Hand No depletion  Patellar Region Unable to assess  Anterior Thigh Region Unable to assess  Posterior Calf Region Unable to assess  Edema (RD Assessment) None  Hair Reviewed  Eyes Reviewed  Mouth Reviewed  Skin Reviewed  Nails Reviewed   Diet Order:   Diet Order             Diet regular Room service appropriate? Yes; Fluid consistency: Thin  Diet effective now                  EDUCATION NEEDS:   Education needs have been addressed  Skin:  Skin Assessment: Reviewed RN Assessment (Large deep decubitus ulcer)  Last BM:  8/8  Height:   Ht Readings from Last 1 Encounters:  05/12/23 4\' 6"  (1.372 m)    Weight:   Wt Readings from Last 1 Encounters:  05/12/23 53.5 kg   BMI:  Body mass index is 28.45 kg/m.  Estimated Nutritional Needs:   Kcal:  1800-2100kcal/day  Protein:  90-105g/day  Fluid:  1.7-1.9L/day  Betsey Holiday MS, RD, LDN Please refer to [facility] for RD and/or RD on-call/weekend/after hours pager

## 2023-05-13 NOTE — Consult Note (Signed)
Pharmacy Antibiotic Note  [name] is a 57 y.o. male with PMH significant for spina bifida admitted on 05/12/2023 for evaluation of increased drainage and size of known sacral ulcer. Patient follows with wound care clinic and was directed to the ED by the wound care physician when the wound was evaluated and noted to be substantially larger and have foul-smelling drainage Pharmacy has been consulted for Vancomycin and Zosyn dosing.  Plan: 1) Continue vancomycin 1000 mg IV Q 48 hrs.  Goal AUC 400-550. Expected AUC: 470 SCr used: 1.53, Vd used: 0.72   2) Zosyn 3.375g IV q8h (4 hour infusion).  Will continue to monitor and de-escalate/adjust dosing as appropriate.  Height: 4\' 6"  (137.2 cm) Weight: 53.5 kg (118 lb) IBW/kg (Calculated) : 36.2  Temp (24hrs), Avg:98.7 F (37.1 C), Min:97.9 F (36.6 C), Max:99.5 F (37.5 C)  Recent Labs  Lab 05/12/23 1626 05/12/23 1843 05/13/23 0430  WBC 9.4  --  6.1  CREATININE 1.59*  --  1.53*  LATICACIDVEN 1.8 1.5  --     Estimated Creatinine Clearance: 33.3 mL/min (A) (by C-G formula based on SCr of 1.53 mg/dL (H)).    Allergies  Allergen Reactions   Morphine And Codeine Nausea And Vomiting   Sulfa Antibiotics Other (See Comments)    Reaction: unknown   Ivp Dye [Iodinated Contrast Media] Hives   Latex Rash    Antimicrobials this admission: Vancomycin 8/8 >>  Zosyn 8/8 >>   Dose adjustments this admission: N/A  Microbiology results: 8/8 BCx: ngtd 8/9 Wound cx: pending   Thank you for allowing pharmacy to be a part of this patient's care.  Barrie Folk, PharmD 05/13/2023 10:29 AM

## 2023-05-13 NOTE — Progress Notes (Signed)
MEWS Progress Note  Patient Details Name: [name] MRN: [id number] DOB: 1966/02/15 Today's Date: 05/13/2023   MEWS Flowsheet Documentation:  Assess: MEWS Score Temp: 98 F (36.7 C) BP: (!) 80/48 MAP (mmHg): (!) 57 Pulse Rate: 70 ECG Heart Rate: 78 Resp: 18 Level of Consciousness: Alert SpO2: 100 % O2 Device: Room Air Assess: MEWS Score MEWS Temp: 0 MEWS Systolic: 2 MEWS Pulse: 0 MEWS RR: 0 MEWS LOC: 0 MEWS Score: 2 MEWS Score Color: Yellow Assess: SIRS CRITERIA SIRS Temperature : 0 SIRS Respirations : 0 SIRS Pulse: 0 SIRS WBC: 0 SIRS Score Sum : 0 Assess: if the MEWS score is Yellow or Red Were vital signs accurate and taken at a resting state?: Yes Does the patient meet 2 or more of the SIRS criteria?: No MEWS guidelines implemented : Yes, yellow Treat MEWS Interventions: Considered administering scheduled or prn medications/treatments as ordered Take Vital Signs Increase Vital Sign Frequency : Yellow: Q2hr x1, continue Q4hrs until patient remains green for 12hrs Escalate MEWS: Escalate: Yellow: Discuss with charge nurse and consider notifying provider and/or RRT        Madie Reno 05/13/2023, 5:03 PM

## 2023-05-13 NOTE — Progress Notes (Signed)
PROGRESS NOTE    [name]  ZOX:[id number] DOB: [birth year] DOA: 05/12/2023 PCP: Marguarite Arbour, MD    Brief Narrative:   57 y.o. male with medical history significant of spina bifida, neurogenic bladder, chronic bilateral hydronephrosis, ostomy, hypertension, hyperlipidemia, GERD, CKD stage IIIa who presents for evaluation of increased drainage and size of known sacral ulcer.  Patient follows with the wound care clinic and was directed to the ED by the wound care physician when the wound was evaluated and noted to have increased substantially in size and have foul-smelling drainage.   On arrival in ED patient is not overtly septic.  He does deny pain.  He has significant neuropathy and no feeling of the sacrum.  He is hemodynamically stable with no signs of overt sepsis or shock.  CT abdomen pelvis performed without contrast in the emergency department (patient has a contrast allergy) demonstrates acute on chronic sacral osteomyelitis.  Patient was started on broad-spectrum IV antibiotics vancomycin and Zosyn and hospitalist contacted for admission.   Assessment & Plan:   Principal Problem:   Wound infection  Right ischial sacral ulcer Left ischial sacral ulcer Acute on chronic sacral osteomyelitis Difficult situation.  Patient clearly has signs of infection.  Sacral osteomyelitis is chronic and severe but may have worsened recently.  Patient is nonseptic/nontoxic-appearing. Plan: Continue broad-spectrum IV antibiotics vancomycin and Zosyn Continue IVF for now As needed pain control WOC consult Surgery consult, case discussed with Dr. Maia Plan Superficial wound cultures Follow blood cultures Monitor vitals and fever curve   Severe hypokalemia Potassium 2.2, now increased to 3  No EKG changes plan: Continue potassium containing maintenance fluids P.o. K-Dur 40 mEq x 2 doses   Spina bifida Chronic bedbound status WOC consult as above Palliative care consult    Suspected malnutrition RD consultation   Essential hypertension Continue holding HCTZ   Hyperlipidemia PTA Crestor   GERD PPI   AKI on CKD stage IIIa Creatinine slightly above baseline, improving IV fluids as above   DVT prophylaxis: SQ Lovenox Code Status: Full Family Communication: None Disposition Plan: Status is: Inpatient Remains inpatient appropriate because: Sacral wound infection   Level of care: Med-Surg  Consultants:  General surgery-[place] Palliative care  Procedures:  None  Antimicrobials: Vancomycin Zosyn    Subjective: seen and examined.  Resting in bed.  No visible distress.  No complaints  Objective: Vitals:   05/12/23 1939 05/12/23 2311 05/13/23 0357 05/13/23 0751  BP: (!) 87/63 103/62 (!) 90/48 (!) 89/54  Pulse: 80 79 75 61  Resp: 18  20 18   Temp: 98 F (36.7 C)  98.9 F (37.2 C) 97.9 F (36.6 C)  TempSrc: Oral   Oral  SpO2: 99%  99% 100%  Weight:      Height:        Intake/Output Summary (Last 24 hours) at 05/13/2023 1119 Last data filed at 05/13/2023 0648 Gross per 24 hour  Intake 792.13 ml  Output 300 ml  Net 492.13 ml   Filed Weights   05/12/23 1738  Weight: 53.5 kg    Examination:  General exam: NAD.  Frail and chronically ill Respiratory system: Bibasilar crackles.  No work of breathing.  Room air Cardiovascular system: S1-S2 RRR, no murmurs, no pedal edema Gastrointestinal system: Thin, soft, NT/ND, + ostomy Central nervous system: Alert and oriented. No focal neurological deficits. Extremities: Decreased power bilateral lower extremities Skin: No rashes, lesions or ulcers Psychiatry: Judgement and insight appear normal. Mood & affect appropriate.  Data Reviewed: I have personally reviewed following labs and imaging studies  CBC: Recent Labs  Lab 05/12/23 1626 05/13/23 0430  WBC 9.4 6.1  NEUTROABS 6.9  --   HGB 12.8* 10.1*  HCT 42.5 32.5*  MCV 78.6* 76.3*  PLT 599* 481*   Basic Metabolic  Panel: Recent Labs  Lab 05/12/23 1626 05/12/23 1725 05/13/23 0430  NA 136  --  139  K 2.2*  --  3.0*  CL 96*  --  106  CO2 26  --  24  GLUCOSE 83  --  91  BUN 31*  --  25*  CREATININE 1.59*  --  1.53*  CALCIUM 9.3  --  8.3*  MG  --  2.6*  --    GFR: Estimated Creatinine Clearance: 33.3 mL/min (A) (by C-G formula based on SCr of 1.53 mg/dL (H)). Liver Function Tests: Recent Labs  Lab 05/12/23 1626 05/13/23 0430  AST 25 17  ALT 20 16  ALKPHOS 77 56  BILITOT 0.4 0.8  PROT 9.1* 6.4*  ALBUMIN 3.3* 2.3*   No results for input(s): "LIPASE", "AMYLASE" in the last 168 hours. No results for input(s): "AMMONIA" in the last 168 hours. Coagulation Profile: No results for input(s): "INR", "PROTIME" in the last 168 hours. Cardiac Enzymes: No results for input(s): "CKTOTAL", "CKMB", "CKMBINDEX", "TROPONINI" in the last 168 hours. BNP (last 3 results) No results for input(s): "PROBNP" in the last 8760 hours. HbA1C: No results for input(s): "HGBA1C" in the last 72 hours. CBG: No results for input(s): "GLUCAP" in the last 168 hours. Lipid Profile: No results for input(s): "CHOL", "HDL", "LDLCALC", "TRIG", "CHOLHDL", "LDLDIRECT" in the last 72 hours. Thyroid Function Tests: No results for input(s): "TSH", "T4TOTAL", "FREET4", "T3FREE", "THYROIDAB" in the last 72 hours. Anemia Panel: No results for input(s): "VITAMINB12", "FOLATE", "FERRITIN", "TIBC", "IRON", "RETICCTPCT" in the last 72 hours. Sepsis Labs: Recent Labs  Lab 05/12/23 1626 05/12/23 1843  LATICACIDVEN 1.8 1.5    Recent Results (from the past 240 hour(s))  Culture, blood (routine x 2)     Status: None (Preliminary result)   Collection Time: 05/12/23  3:32 PM   Specimen: BLOOD  Result Value Ref Range Status   Specimen Description BLOOD BLOOD LEFT FOREARM  Final   Special Requests   Final    BOTTLES DRAWN AEROBIC AND ANAEROBIC Blood Culture adequate volume   Culture   Final    NO GROWTH < 12 HOURS Performed at  [facility], [address] [place], [place] [address]    Report Status PENDING  Incomplete  Culture, blood (routine x 2)     Status: None (Preliminary result)   Collection Time: 05/12/23  5:25 PM   Specimen: BLOOD  Result Value Ref Range Status   Specimen Description BLOOD BLOOD LEFT HAND  Final   Special Requests   Final    BOTTLES DRAWN AEROBIC AND ANAEROBIC Blood Culture adequate volume   Culture   Final    NO GROWTH < 12 HOURS Performed at [facility], [address] [place], [place] [address]    Report Status PENDING  Incomplete         Radiology Studies: CT PELVIS WO CONTRAST  Result Date: 05/12/2023 CLINICAL DATA:  Osteomyelitis suspected, pelvis, no prior imaging EXAM: CT PELVIS WITHOUT CONTRAST TECHNIQUE: Multidetector CT imaging of the pelvis was performed following the standard protocol without intravenous contrast. RADIATION DOSE REDUCTION: This exam was performed according to the departmental dose-optimization program which includes automated exposure  control, adjustment of the mA and/or kV according to patient size and/or use of iterative reconstruction technique. COMPARISON:  CT 09/09/2021, MRI 11/01/2022 FINDINGS: Urinary Tract: Mild diffuse urinary bladder wall thickening, likely accentuated by under-distension. Bowel: No evidence of bowel obstruction or active bowel inflammation within the pelvis. Vascular/Lymphatic: No pathologically enlarged lymph nodes. No significant vascular abnormality seen. Reproductive:  Prostatomegaly. Other:  No free air or free fluid within the pelvis. Musculoskeletal: Large deep decubitus ulcer underlying the right ischium. Progressive bone loss at the ischial tuberosity and inferior pubic ramus on the right with increased sclerosis of the adjacent bone compatible with acute on chronic osteomyelitis. There is adjacent skin thickening and mild stranding within the adjacent fat. No organized fluid collections. Right hip  joint remains intact. Capsular thickening of the right hip joint without large effusion. Chronic left ischial decubitus ulcer with similar degree of sclerosis in the left ischial bone without definite progression of bone loss to suggest acute osteomyelitis at this location. Chronic left hip deformity. Chronic left hip capsular thickening with small joint effusion. No acute fractures. IMPRESSION: 1. Large deep decubitus ulcer underlying the right ischium with progressive bone loss at the ischial tuberosity and inferior pubic ramus on the right with increased sclerosis of the adjacent bone compatible with acute on chronic osteomyelitis. 2. Chronic left ischial decubitus ulcer with similar degree of sclerosis in the left ischial bone without definite progression of bone loss to suggest acute osteomyelitis at this location. 3. Chronic left hip deformity with small joint effusion. 4. Prostatomegaly with mild diffuse urinary bladder wall thickening, likely accentuated by under-distension. Correlate with urinalysis to exclude cystitis. Electronically Signed   By: Duanne Guess D.O.   On: 05/12/2023 16:02        Scheduled Meds:  enoxaparin (LOVENOX) injection  40 mg Subcutaneous Q24H   folic acid  1 mg Oral Daily   multivitamin with minerals  1 tablet Oral Daily   pantoprazole  40 mg Oral Daily   potassium chloride  40 mEq Oral Q2H   rosuvastatin  10 mg Oral QHS   sodium hypochlorite   Topical 1 day or 1 dose   sucralfate  1 g Oral TID   thiamine  100 mg Oral Daily   traZODone  50 mg Oral QHS   Continuous Infusions:  lactated ringers 75 mL/hr at 05/12/23 2015   piperacillin-tazobactam (ZOSYN)  IV 3.375 g (05/13/23 0506)   [START ON 05/14/2023] vancomycin       LOS: 1 day      Tresa Moore, MD [facility]   If 7PM-7AM, please contact night-coverage  05/13/2023, 11:19 AM

## 2023-05-13 NOTE — Progress Notes (Signed)
Wound culture sent to lab.   Madie Reno, RN

## 2023-05-13 NOTE — Plan of Care (Signed)

## 2023-05-13 NOTE — Plan of Care (Signed)
  Problem: Skin Integrity: Goal: Risk for impaired skin integrity will decrease Outcome: Progressing   Problem: Pain Managment: Goal: General experience of comfort will improve Outcome: Progressing   

## 2023-05-13 NOTE — Consult Note (Addendum)
WOC consult requested for sacrum wound.   Performed remotely after review of progress notes and results of CT scan and notes from the wound care center.  Pt has been followed as an outpatient by the outpatient wound care center for chronic wounds and they had ordered Dakins; Right ischium  with chronic Stage 4 pressure injury, present on admission.  Left ischium chronic Unstageable pressure injury, present on admission. I will place the order for bedside nurses to perform until further input is available from the surgical team. Topical treatment orders provided as follows: Apply Dakins soaked gauze to left and right ischium wounds Q day, then cover with ABD pads and tape. Air mattress has already been ordered to reduce pressure.   According to CT: Large deep decubitus ulcer underlying the right ischium with progressive bone loss at the ischial tuberosity and inferior pubic ramus on the right with increased sclerosis of the adjacent bone compatible with acute on chronic osteomyelitis. 2. Chronic left ischial decubitus ulcer with similar degree of sclerosis in the left ischial bone without definite progression of bone loss to suggest acute osteomyelitis at this location.  This complex medical condition is beyond the WOC scope of practice.  Surgical consult has been requested and is pending.  Please refer to their team for further plan of care.  Please re-consult if further assistance is needed.  Thank-you,  Cammie Mcgee MSN, RN, CWOCN, [place], CNS [phone]

## 2023-05-13 NOTE — Consult Note (Signed)
SURGICAL CONSULTATION NOTE   HISTORY OF PRESENT ILLNESS (HPI):  57 y.o. male presented to [facility] ED for evaluation of infected chronic wounds. Patient reports he was at the wound care clinic and he was referred to the ED due to malodorous chronic decubitus ulcers.  Patient with history of spina bifida, neurogenic bladder, hypertension, chronic kidney disease stage IIIa.  Patient denies any specific pain.  No paralyzation.  No alleviating or aggravating factors.  Patient had a CT scan of the pelvis done on admission and shows chronic changes involving the hip joints.  No sign of acute deep abscess.  I personally evaluated the images.  Surgery is consulted by Dr. Georgeann Oppenheim in this context for evaluation and management of stage IV chronic decubitus ulcers.  PAST MEDICAL HISTORY (PMH):  Past Medical History:  Diagnosis Date   Abdominal distention 10/11/2016   Acute lower UTI 12/22/2019   Acute sepsis (HCC) 04/21/2016   AKI (acute kidney injury) (HCC) 06/27/2020   Decubitus ulcer    Distal intestinal obstruction syndrome (HCC) 10/11/2016   Fecal impaction in rectum (HCC) 10/10/2016   Gastric outlet obstruction 06/03/2016   Gastritis    GERD (gastroesophageal reflux disease) 12/22/2019   HTN (hypertension) 06/27/2020   Hydronephrosis    Hyperlipidemia    Hypokalemia 10/11/2016   Impaction of the bowels (HCC)    Kidney stone    Left ureteral stone 06/27/2020   Leukocytosis 10/11/2016   Loose stools 10/11/2016   Nausea & vomiting 06/03/2016   Renal disorder    SBO (small bowel obstruction) (HCC) 11/02/2017   Sepsis due to gram-negative UTI (HCC) 03/08/2021   Septic shock (HCC) 03/08/2021   SIRS (systemic inflammatory response syndrome) (HCC) 06/03/2016   Spina bifida (HCC)    Ulcer of esophagus with bleeding      PAST SURGICAL HISTORY (PSH):  Past Surgical History:  Procedure Laterality Date   ABDOMINAL SURGERY     APPENDECTOMY     BACK SURGERY     COLON SURGERY     CREATION / REVISION OF ILEOSTOMY /  JEJUNOSTOMY  05/2015   CYSTOSCOPY WITH RETROGRADE PYELOGRAM, URETEROSCOPY AND STENT PLACEMENT  03/2014   ESOPHAGOGASTRODUODENOSCOPY (EGD) WITH PROPOFOL N/A 06/03/2016   Procedure: ESOPHAGOGASTRODUODENOSCOPY (EGD) WITH PROPOFOL;  Surgeon: Midge Minium, MD;  Location: [facility] ENDOSCOPY;  Service: Endoscopy;  Laterality: N/A;   ESOPHAGOGASTRODUODENOSCOPY (EGD) WITH PROPOFOL N/A 04/27/2019   Procedure: ESOPHAGOGASTRODUODENOSCOPY (EGD) WITH PROPOFOL;  Surgeon: Wyline Mood, MD;  Location: [facility] ENDOSCOPY;  Service: Gastroenterology;  Laterality: N/A;   ilieostomy     PERCUTANEOUS NEPHROSTOLITHOTOMY     PERCUTANEOUS NEPHROSTOMY     VENTRICULOPERITONEAL SHUNT     vp shunt removal       MEDICATIONS:  Prior to Admission medications   Medication Sig Start Date End Date Taking? Authorizing Provider  clotrimazole-betamethasone (LOTRISONE) cream Apply 1 application. topically daily. 12/07/21  Yes McDonald, Rachelle Hora, DPM  collagenase (SANTYL) ointment Apply topically daily. 09/24/21  Yes Lurene Shadow, MD  dicyclomine (BENTYL) 20 MG tablet Take 20 mg by mouth 2 (two) times daily.   Yes [provider]  hydrochlorothiazide (HYDRODIURIL) 25 MG tablet Take 25 mg by mouth daily. 06/19/20  Yes [provider]  pantoprazole (PROTONIX) 40 MG tablet Take 1 tablet (40 mg total) by mouth daily. 04/29/19  Yes Sainani, Rolly Pancake, MD  potassium chloride SA (KLOR-CON) 20 MEQ tablet Take 20 mEq by mouth daily.   Yes [provider]  rosuvastatin (CRESTOR) 10 MG tablet Take 10 mg by  mouth at bedtime. 10/20/19  Yes [provider]  sucralfate (CARAFATE) 1 g tablet Take 1 g by mouth 3 (three) times daily. 12/14/19  Yes [provider]  traZODone (DESYREL) 50 MG tablet Take 50 mg by mouth at bedtime. 02/15/21  Yes [provider]  ciprofloxacin (CIPRO) 500 MG tablet Take 500 mg by mouth 2 (two) times daily. Patient not taking: Reported on 05/12/2023    [provider]   Cranberry-Vitamin C-Probiotic (AZO CRANBERRY PO) Take 1 tablet by mouth daily.    [provider]  Multiple Vitamins-Minerals (CENTRUM MEN PO) Take 1 tablet by mouth daily.     [provider]     ALLERGIES:  Allergies  Allergen Reactions   Morphine And Codeine Nausea And Vomiting   Sulfa Antibiotics Other (See Comments)    Reaction: unknown   Ivp Dye [Iodinated Contrast Media] Hives   Latex Rash     SOCIAL HISTORY:  Social History   Socioeconomic History   Marital status: Single    Spouse name: Not on file   Number of children: Not on file   Years of education: Not on file   Highest education level: Not on file  Occupational History   Occupation: disabled  Tobacco Use   Smoking status: Never   Smokeless tobacco: Never  Substance and Sexual Activity   Alcohol use: No   Drug use: No   Sexual activity: Never  Other Topics Concern   Not on file  Social History Narrative   Not on file   Social Determinants of Health   Financial Resource Strain: Not on file  Food Insecurity: No Food Insecurity (06/15/2019)   Received [facility], [facility]   Hunger Vital Sign    Worried About Running Out of Food in the Last Year: Never true    Ran Out of Food in the Last Year: Never true  Transportation Needs: Not on file  Physical Activity: Not on file  Stress: Not on file  Social Connections: Not on file  Intimate Partner Violence: Not on file      FAMILY HISTORY:  Family History  Problem Relation Age of Onset   Diabetes Mellitus II Father    Bladder Cancer Neg Hx    Kidney cancer Neg Hx    Prostate cancer Neg Hx      REVIEW OF SYSTEMS:  Constitutional: denies weight loss, fever, chills, or sweats  Eyes: denies any other vision changes, history of eye injury  ENT: denies sore throat, hearing problems  Respiratory: denies shortness of breath, wheezing  Cardiovascular: denies chest pain, palpitations  Gastrointestinal: denies abdominal  pain, nausea and vomiting Genitourinary: chronic neurogenic bladder.  Musculoskeletal: denies any other joint pains or cramps  Skin: denies any other rashes or skin discolorations. Positive for chronic wounds  Neurological: denies any other headache, dizziness. Psychiatric: denies any other depression, anxiety   All other review of systems were negative   VITAL SIGNS:  Temp:  [97.9 F (36.6 C)-98.9 F (37.2 C)] 97.9 F (36.6 C) (08/09 0751) Pulse Rate:  [61-85] 61 (08/09 0751) Resp:  [18-20] 18 (08/09 0751) BP: (87-103)/(48-63) 89/54 (08/09 0751) SpO2:  [96 %-100 %] 100 % (08/09 0751) Weight:  [53.5 kg] 53.5 kg (08/08 1738)     Height: 4\' 6"  (137.2 cm) Weight: 53.5 kg BMI (Calculated): 28.43   INTAKE/OUTPUT:  This shift: No intake/output data recorded.  Last 2 shifts: @IOLAST2SHIFTS @   PHYSICAL EXAM:  Constitutional:  -- Awake,  alert, and oriented x3  Eyes:  -- Pupils equally round and reactive to light  -- No scleral icterus  Ear, nose, and throat:  -- No jugular venous distension  Pulmonary:  -- No crackles  -- Equal breath sounds bilaterally -- Breathing non-labored at rest Cardiovascular:  -- S1, S2 present  -- No pericardial rubs Gastrointestinal:  -- Abdomen soft, nontender, non-distended, no guarding or rebound tenderness -- No abdominal masses appreciated, pulsatile or otherwise  Musculoskeletal and Integumentary:  -- Wounds: Large right decubitus ulcer, no purulence, no necrotic tissue.  No edema  Neurologic:  -- Cranial nerves grossly intact, sensation intact, alert   Labs:     Latest Ref Rng & Units 05/13/2023    4:30 AM 05/12/2023    4:26 PM 09/23/2021    5:10 AM  CBC  WBC 4.0 - 10.5 K/uL 6.1  9.4  7.9   Hemoglobin 13.0 - 17.0 g/dL 16.1  09.6  04.5   Hematocrit 39.0 - 52.0 % 32.5  42.5  35.1   Platelets 150 - 400 K/uL 481  599  348       Latest Ref Rng & Units 05/13/2023    4:30 AM 05/12/2023    4:26 PM 09/23/2021    5:10 AM  CMP  Glucose 70 - 99  mg/dL 91  83  85   BUN 6 - 20 mg/dL 25  31  17    Creatinine 0.61 - 1.24 mg/dL 4.09  8.11  9.14   Sodium 135 - 145 mmol/L 139  136  137   Potassium 3.5 - 5.1 mmol/L 3.0  2.2  3.7   Chloride 98 - 111 mmol/L 106  96  112   CO2 22 - 32 mmol/L 24  26  20    Calcium 8.9 - 10.3 mg/dL 8.3  9.3  8.5   Total Protein 6.5 - 8.1 g/dL 6.4  9.1    Total Bilirubin 0.3 - 1.2 mg/dL 0.8  0.4    Alkaline Phos 38 - 126 U/L 56  77    AST 15 - 41 U/L 17  25    ALT 0 - 44 U/L 16  20      Imaging studies:  EXAM: CT PELVIS WITHOUT CONTRAST   TECHNIQUE: Multidetector CT imaging of the pelvis was performed following the standard protocol without intravenous contrast.   RADIATION DOSE REDUCTION: This exam was performed according to the departmental dose-optimization program which includes automated exposure control, adjustment of the mA and/or kV according to patient size and/or use of iterative reconstruction technique.   COMPARISON:  CT 09/09/2021, MRI 11/01/2022   FINDINGS: Urinary Tract: Mild diffuse urinary bladder wall thickening, likely accentuated by under-distension.   Bowel: No evidence of bowel obstruction or active bowel inflammation within the pelvis.   Vascular/Lymphatic: No pathologically enlarged lymph nodes. No significant vascular abnormality seen.   Reproductive:  Prostatomegaly.   Other:  No free air or free fluid within the pelvis.   Musculoskeletal: Large deep decubitus ulcer underlying the right ischium. Progressive bone loss at the ischial tuberosity and inferior pubic ramus on the right with increased sclerosis of the adjacent bone compatible with acute on chronic osteomyelitis. There is adjacent skin thickening and mild stranding within the adjacent fat. No organized fluid collections. Right hip joint remains intact. Capsular thickening of the right hip joint without large effusion.   Chronic left ischial decubitus ulcer with similar degree of sclerosis in the left  ischial bone without definite progression of bone  loss to suggest acute osteomyelitis at this location. Chronic left hip deformity. Chronic left hip capsular thickening with small joint effusion. No acute fractures.   IMPRESSION: 1. Large deep decubitus ulcer underlying the right ischium with progressive bone loss at the ischial tuberosity and inferior pubic ramus on the right with increased sclerosis of the adjacent bone compatible with acute on chronic osteomyelitis. 2. Chronic left ischial decubitus ulcer with similar degree of sclerosis in the left ischial bone without definite progression of bone loss to suggest acute osteomyelitis at this location. 3. Chronic left hip deformity with small joint effusion. 4. Prostatomegaly with mild diffuse urinary bladder wall thickening, likely accentuated by under-distension. Correlate with urinalysis to exclude cystitis.     Electronically Signed   By: Duanne Guess D.O.   On: 05/12/2023 16:02  Assessment/Plan:  57 y.o. male with stage IV right decubitus ulcer, complicated by pertinent comorbidities including spina bifida, hypertension, chronic kidney disease.  Patient consulted for evaluation of decubitus ulcers.  Imaging and physical exam consistent with chronic ulcers without any acute changes.  No necrotic tissue.  No purulence.  No need for debridement.  Continue medical management.  I consider that the stiffness and extension of this ulcers down to the hip bones and joints is out of any curative intervention.   Gae Gallop, MD

## 2023-05-13 NOTE — Consult Note (Signed)
Consultation Note Date: 05/13/2023   Patient Name: [name]  DOB: 31-Dec-1965  MRN: [id number]  Age / Sex: 57 y.o., male  PCP: Marguarite Arbour, MD Referring Physician: Tresa Moore, MD  Reason for Consultation: Establishing goals of care   HPI/Brief Hospital Course: 57 y.o. male  with past medical history of spina bifida, paraplegia, neurogenic bladder, chronic hydronephrosis, ileal conduit, ostomy, HTN, HLD, GERD, CKD stage 3a admitted from wound care clinic on 05/12/2023 with enlarging, worsening with concern for acute infection to right and left ischial ulcers.  Chronic sacral osteomyelitis-may have acutely worsened  General surgery has been consulted  Has been started on appropriate antibiotic therapy for infection   Palliative medicine was consulted for assisting with goals of care conversations.  Subjective:  Extensive chart review has been completed prior to meeting patient including labs, vital signs, imaging, progress notes, orders, and available advanced directive documents from current and previous encounters.  Visited with [name] at his bedside, awake, alert, oriented and able to engage in goals of care conversations.  Introduced myself as a Publishing rights manager as a member of the palliative care team. Explained palliative medicine is specialized medical care for people living with serious illness. It focuses on providing relief from the symptoms and stress of a serious illness. The goal is to improve quality of life for both the patient and the family.   Franz shares a brief life review. He has been disabled his entire life, his mother was his primary caregiver prior to her passing in 2017 and he then moved in with his sister-[name] who assumed role as his caretaker and HCPOA.  At baseline, prior to discharge, [name] able to transfer himself from bed to wheelchair, able to dress, bathe and feed himself. Required assistance  with ostomy and catheter bag emptying and changing.  [name] shares his understanding of current medical condition. Aware of worsening sacral wound-has been followed by the wound clinic for quite some time. He is hopeful general surgery has interventions to offer him and remains hopeful for wound healing and recovery.  Attempted to illicit goals of care. We discussed code status, Full Code versus Do Not Resuscitate. Encouraged [name] to consider DNR/DNI status understanding evidenced based poor outcomes in similar hospitalized patients, as the cause of the arrest is likely associated with chronic/terminal disease rather than a reversible acute cardio-pulmonary event. At this time, Eligh wishes to remain Full Code but will take time to consider other options and have further conversations with his sister.  Attempted to discuss long term goals, we discussed long term life preserving measures including trach and PEG placement for which [name] is clear he would not want.  I discussed importance of continued conversations with family/support persons and all members of their medical team regarding overall plan of care and treatment options ensuring decisions are in alignment with patients goals of care.  All questions/concerns addressed. Emotional support provided to patient/family/support persons. PMT will continue to follow and support patient as needed.  Objective: Primary Diagnoses: Present on Admission:  Wound infection   Physical Exam Constitutional:      General: He is not in acute distress.    Appearance: He is ill-appearing.  Pulmonary:     Effort: Pulmonary effort is normal. No respiratory distress.  Neurological:     Mental Status: He is alert and oriented to person, place, and time.  Psychiatric:        Mood and Affect: Mood normal.  Thought Content: Thought content normal.     Vital Signs: BP (!) 89/54 (BP Location: Right Arm)   Pulse 61   Temp 97.9 F (36.6 C)  (Oral)   Resp 18   Ht 4\' 6"  (1.372 m)   Wt 53.5 kg   SpO2 100%   BMI 28.45 kg/m  Pain Scale: 0-10   Pain Score: 5    IO: Intake/output summary:  Intake/Output Summary (Last 24 hours) at 05/13/2023 1243 Last data filed at 05/13/2023 1610 Gross per 24 hour  Intake 792.13 ml  Output 300 ml  Net 492.13 ml    LBM: Last BM Date : 05/12/23 Baseline Weight: Weight: 53.5 kg Most recent weight: Weight: 53.5 kg       Palliative Assessment/Data: 40%   Assessment and Plan  SUMMARY OF RECOMMENDATIONS   Full Code, Full Scope Would not be accepting of long term life preserving measures including trach/PEG-will recommend completion/update of Living Will Open to ongoing GOC conversations PMT to continue to follow for ongoing needs and support   Thank you for this consult and allowing Palliative Medicine to participate in the care of [name]. Palliative medicine will continue to follow and assist as needed.   Time Total: 75 minutes  Time spent includes: Detailed review of medical records (labs, imaging, vital signs), medically appropriate exam (mental status, respiratory, cardiac, skin), discussed with treatment team, counseling and educating patient, family and staff, documenting clinical information, medication management and coordination of care.   Signed by: Leeanne Deed, DNP, AGNP-C Palliative Medicine    Please contact Palliative Medicine Team phone at [phone] for questions and concerns.  For individual provider: See Loretha Stapler

## 2023-05-14 DIAGNOSIS — L089 Local infection of the skin and subcutaneous tissue, unspecified: Secondary | ICD-10-CM | POA: Diagnosis not present

## 2023-05-14 DIAGNOSIS — T148XXA Other injury of unspecified body region, initial encounter: Secondary | ICD-10-CM | POA: Diagnosis not present

## 2023-05-14 DIAGNOSIS — M86151 Other acute osteomyelitis, right femur: Secondary | ICD-10-CM | POA: Diagnosis not present

## 2023-05-14 DIAGNOSIS — L89319 Pressure ulcer of right buttock, unspecified stage: Secondary | ICD-10-CM | POA: Diagnosis not present

## 2023-05-14 LAB — BASIC METABOLIC PANEL
Anion gap: 9 (ref 5–15)
BUN: 20 mg/dL (ref 6–20)
CO2: 21 mmol/L — ABNORMAL LOW (ref 22–32)
Calcium: 8.2 mg/dL — ABNORMAL LOW (ref 8.9–10.3)
Chloride: 110 mmol/L (ref 98–111)
Creatinine, Ser: 1.58 mg/dL — ABNORMAL HIGH (ref 0.61–1.24)
GFR, Estimated: 51 mL/min — ABNORMAL LOW (ref >60)
Glucose, Bld: 86 mg/dL (ref 70–99)
Potassium: 3.7 mmol/L (ref 3.5–5.1)
Sodium: 140 mmol/L (ref 135–145)

## 2023-05-14 LAB — CBC WITH DIFFERENTIAL/PLATELET
Abs Immature Granulocytes: 0.01 10*3/uL (ref 0.00–0.07)
Basophils Absolute: 0.1 10*3/uL (ref 0.0–0.1)
Basophils Relative: 1 %
Eosinophils Absolute: 0.4 10*3/uL (ref 0.0–0.5)
Eosinophils Relative: 6 %
HCT: 35.2 % — ABNORMAL LOW (ref 39.0–52.0)
Hemoglobin: 10.3 g/dL — ABNORMAL LOW (ref 13.0–17.0)
Immature Granulocytes: 0 %
Lymphocytes Relative: 20 %
Lymphs Abs: 1.3 10*3/uL (ref 0.7–4.0)
MCH: 23.3 pg — ABNORMAL LOW (ref 26.0–34.0)
MCHC: 29.3 g/dL — ABNORMAL LOW (ref 30.0–36.0)
MCV: 79.6 fL — ABNORMAL LOW (ref 80.0–100.0)
Monocytes Absolute: 0.4 10*3/uL (ref 0.1–1.0)
Monocytes Relative: 6 %
Neutro Abs: 4.3 10*3/uL (ref 1.7–7.7)
Neutrophils Relative %: 67 %
Platelets: 485 10*3/uL — ABNORMAL HIGH (ref 150–400)
RBC: 4.42 MIL/uL (ref 4.22–5.81)
RDW: 16.7 % — ABNORMAL HIGH (ref 11.5–15.5)
WBC: 6.5 10*3/uL (ref 4.0–10.5)
nRBC: 0 % (ref 0.0–0.2)

## 2023-05-14 NOTE — Progress Notes (Signed)
PROGRESS NOTE    [name]  UUV:[id number] DOB: October 04, 1966 DOA: 05/12/2023 PCP: Marguarite Arbour, MD    Brief Narrative:   57 y.o. male with medical history significant of spina bifida, neurogenic bladder, chronic bilateral hydronephrosis, ostomy, hypertension, hyperlipidemia, GERD, CKD stage IIIa who presents for evaluation of increased drainage and size of known sacral ulcer.  Patient follows with the wound care clinic and was directed to the ED by the wound care physician when the wound was evaluated and noted to have increased substantially in size and have foul-smelling drainage.   On arrival in ED patient is not overtly septic.  He does deny pain.  He has significant neuropathy and no feeling of the sacrum.  He is hemodynamically stable with no signs of overt sepsis or shock.  CT abdomen pelvis performed without contrast in the emergency department (patient has a contrast allergy) demonstrates acute on chronic sacral osteomyelitis.  Patient was started on broad-spectrum IV antibiotics vancomycin and Zosyn and hospitalist contacted for admission.   Assessment & Plan:   Principal Problem:   Wound infection  Right ischial sacral ulcer Left ischial sacral ulcer Acute on chronic sacral osteomyelitis Difficult situation.  Patient clearly has signs of infection.  Sacral osteomyelitis is chronic and severe but may have worsened recently.  Patient is nonseptic/nontoxic-appearing.  Per general surgery no debridement warranted at this time.  Appreciate their recommendations Plan: Continue broad-spectrum IV antibiotics vancomycin and Zosyn Discontinue IVF As needed pain control WOC consult As no surgical intervention is warranted will plan to maintain on broad-spectrum IV antibiotics over the weekend.  Monday morning we will reach out to infectious disease for evaluation.  Patient may benefit from continued IV antibiotic therapy.  Anticipate discharge either Monday or Tuesday.  Severe  hypokalemia, resolved Potassium 2.2, now increased to 3.7 No EKG changes plan: DC IVF   Spina bifida Chronic bedbound status WOC consult as above Palliative care consult   Suspected malnutrition RD consultation   Essential hypertension Continue holding HCTZ   Hyperlipidemia PTA Crestor   GERD PPI   AKI on CKD stage IIIa Creatinine slightly above baseline, improving   DVT prophylaxis: SQ Lovenox Code Status: Full Family Communication: None Disposition Plan: Status is: Inpatient Remains inpatient appropriate because: Sacral wound infection   Level of care: Med-Surg  Consultants:  General surgery-[place] Palliative care  Procedures:  None  Antimicrobials: Vancomycin Zosyn    Subjective: Seen and examined.  Resting in bed.  No visible distress  Objective: Vitals:   05/13/23 1940 05/13/23 2326 05/14/23 0451 05/14/23 0923  BP: (!) 75/48 (!) 79/45 (!) 82/39 (!) 82/54  Pulse: 71 66 71 71  Resp: 18 18 18 16   Temp: 98.5 F (36.9 C) 98.9 F (37.2 C) 98.6 F (37 C)   TempSrc: Oral     SpO2: 100% 100% 100% 100%  Weight:      Height:        Intake/Output Summary (Last 24 hours) at 05/14/2023 1005 Last data filed at 05/13/2023 1730 Gross per 24 hour  Intake 520.72 ml  Output 700 ml  Net -179.28 ml   Filed Weights   05/12/23 1738  Weight: 53.5 kg    Examination:  General exam: NAD.  Frail and chronically ill-appearing Respiratory system: Bibasilar crackles.  No work of breathing.  Room air Cardiovascular system: S1-S2 RRR, no murmurs, no pedal edema Gastrointestinal system: Thin, soft, NT/ND, + ostomy Central nervous system: Alert and oriented. No focal neurological deficits. Extremities: Decreased power bilateral  lower extremities Skin: Significant bilateral ischial wounds Psychiatry: Judgement and insight appear normal. Mood & affect appropriate.     Data Reviewed: I have personally reviewed following labs and imaging studies  CBC: Recent  Labs  Lab 05/12/23 1626 05/13/23 0430 05/14/23 0850  WBC 9.4 6.1 6.5  NEUTROABS 6.9  --  4.3  HGB 12.8* 10.1* 10.3*  HCT 42.5 32.5* 35.2*  MCV 78.6* 76.3* 79.6*  PLT 599* 481* 485*   Basic Metabolic Panel: Recent Labs  Lab 05/12/23 1626 05/12/23 1725 05/13/23 0430 05/14/23 0415 05/14/23 0850  NA 136  --  139  --  140  K 2.2*  --  3.0*  --  3.7  CL 96*  --  106  --  110  CO2 26  --  24  --  21*  GLUCOSE 83  --  91  --  86  BUN 31*  --  25*  --  20  CREATININE 1.59*  --  1.53*  --  1.58*  CALCIUM 9.3  --  8.3*  --  8.2*  MG  --  2.6*  --   --   --   PHOS  --   --   --  2.4*  --    GFR: Estimated Creatinine Clearance: 32.2 mL/min (A) (by C-G formula based on SCr of 1.58 mg/dL (H)). Liver Function Tests: Recent Labs  Lab 05/12/23 1626 05/13/23 0430  AST 25 17  ALT 20 16  ALKPHOS 77 56  BILITOT 0.4 0.8  PROT 9.1* 6.4*  ALBUMIN 3.3* 2.3*   No results for input(s): "LIPASE", "AMYLASE" in the last 168 hours. No results for input(s): "AMMONIA" in the last 168 hours. Coagulation Profile: No results for input(s): "INR", "PROTIME" in the last 168 hours. Cardiac Enzymes: No results for input(s): "CKTOTAL", "CKMB", "CKMBINDEX", "TROPONINI" in the last 168 hours. BNP (last 3 results) No results for input(s): "PROBNP" in the last 8760 hours. HbA1C: No results for input(s): "HGBA1C" in the last 72 hours. CBG: No results for input(s): "GLUCAP" in the last 168 hours. Lipid Profile: No results for input(s): "CHOL", "HDL", "LDLCALC", "TRIG", "CHOLHDL", "LDLDIRECT" in the last 72 hours. Thyroid Function Tests: No results for input(s): "TSH", "T4TOTAL", "FREET4", "T3FREE", "THYROIDAB" in the last 72 hours. Anemia Panel: No results for input(s): "VITAMINB12", "FOLATE", "FERRITIN", "TIBC", "IRON", "RETICCTPCT" in the last 72 hours. Sepsis Labs: Recent Labs  Lab 05/12/23 1626 05/12/23 1843  LATICACIDVEN 1.8 1.5    Recent Results (from the past 240 hour(s))  Culture,  blood (routine x 2)     Status: None (Preliminary result)   Collection Time: 05/12/23  3:32 PM   Specimen: BLOOD  Result Value Ref Range Status   Specimen Description BLOOD BLOOD LEFT FOREARM  Final   Special Requests   Final    BOTTLES DRAWN AEROBIC AND ANAEROBIC Blood Culture adequate volume   Culture   Final    NO GROWTH 2 DAYS Performed at [facility], [address], [place], [place] [address]    Report Status PENDING  Incomplete  Culture, blood (routine x 2)     Status: None (Preliminary result)   Collection Time: 05/12/23  5:25 PM   Specimen: BLOOD  Result Value Ref Range Status   Specimen Description BLOOD BLOOD LEFT HAND  Final   Special Requests   Final    BOTTLES DRAWN AEROBIC AND ANAEROBIC Blood Culture adequate volume   Culture   Final    NO GROWTH 2 DAYS Performed  at [facility], [address] [place], [place] [address]    Report Status PENDING  Incomplete  Aerobic Culture w Gram Stain (superficial specimen)     Status: None (Preliminary result)   Collection Time: 05/13/23 11:35 AM   Specimen: Wound  Result Value Ref Range Status   Specimen Description   Final    WOUND Performed at [facility], [address], [place], [place] [address]    Special Requests   Final    NONE Performed at [facility], [address] [place], [place] [address]    Gram Stain   Final    RARE WBC PRESENT,BOTH PMN AND MONONUCLEAR RARE GRAM POSITIVE COCCI Performed at [facility], [address], [place], [place] [address]    Culture PENDING  Incomplete   Report Status PENDING  Incomplete         Radiology Studies: CT PELVIS WO CONTRAST  Result Date: 05/12/2023 CLINICAL DATA:  Osteomyelitis suspected, pelvis, no prior imaging EXAM: CT PELVIS WITHOUT CONTRAST TECHNIQUE: Multidetector CT imaging of the pelvis was performed following the standard protocol without intravenous contrast. RADIATION DOSE REDUCTION: This exam was  performed according to the departmental dose-optimization program which includes automated exposure control, adjustment of the mA and/or kV according to patient size and/or use of iterative reconstruction technique. COMPARISON:  CT 09/09/2021, MRI 11/01/2022 FINDINGS: Urinary Tract: Mild diffuse urinary bladder wall thickening, likely accentuated by under-distension. Bowel: No evidence of bowel obstruction or active bowel inflammation within the pelvis. Vascular/Lymphatic: No pathologically enlarged lymph nodes. No significant vascular abnormality seen. Reproductive:  Prostatomegaly. Other:  No free air or free fluid within the pelvis. Musculoskeletal: Large deep decubitus ulcer underlying the right ischium. Progressive bone loss at the ischial tuberosity and inferior pubic ramus on the right with increased sclerosis of the adjacent bone compatible with acute on chronic osteomyelitis. There is adjacent skin thickening and mild stranding within the adjacent fat. No organized fluid collections. Right hip joint remains intact. Capsular thickening of the right hip joint without large effusion. Chronic left ischial decubitus ulcer with similar degree of sclerosis in the left ischial bone without definite progression of bone loss to suggest acute osteomyelitis at this location. Chronic left hip deformity. Chronic left hip capsular thickening with small joint effusion. No acute fractures. IMPRESSION: 1. Large deep decubitus ulcer underlying the right ischium with progressive bone loss at the ischial tuberosity and inferior pubic ramus on the right with increased sclerosis of the adjacent bone compatible with acute on chronic osteomyelitis. 2. Chronic left ischial decubitus ulcer with similar degree of sclerosis in the left ischial bone without definite progression of bone loss to suggest acute osteomyelitis at this location. 3. Chronic left hip deformity with small joint effusion. 4. Prostatomegaly with mild diffuse  urinary bladder wall thickening, likely accentuated by under-distension. Correlate with urinalysis to exclude cystitis. Electronically Signed   By: Duanne Guess D.O.   On: 05/12/2023 16:02        Scheduled Meds:  vitamin C  500 mg Oral BID   enoxaparin (LOVENOX) injection  40 mg Subcutaneous Q24H   folic acid  1 mg Oral Daily   lactose free nutrition  237 mL Oral TID WC   multivitamin with minerals  1 tablet Oral Daily   pantoprazole  40 mg Oral Daily   rosuvastatin  10 mg Oral QHS   simethicone  80 mg Oral QID   sodium hypochlorite   Topical 1 day or 1  dose   sucralfate  1 g Oral TID   thiamine  100 mg Oral Daily   traZODone  50 mg Oral QHS   zinc sulfate  220 mg Oral Daily   Continuous Infusions:  lactated ringers Stopped (05/13/23 1429)   piperacillin-tazobactam (ZOSYN)  IV 3.375 g (05/14/23 0532)   vancomycin       LOS: 2 days      Tresa Moore, MD [facility]   If 7PM-7AM, please contact night-coverage  05/14/2023, 10:05 AM

## 2023-05-14 NOTE — Progress Notes (Signed)
Daily Progress Note   Patient Name: [name]       Date: 05/14/2023 DOB: 16-Nov-1965  Age: 57 y.o. MRN#: [id number] Attending Physician: Chase Moore, MD Primary Care Physician: Chase Arbour, MD Admit Date: 05/12/2023  Reason for Consultation/Follow-up: Establishing goals of care  HPI/Brief Hospital Review: 57 y.o. male  with past medical history of spina bifida, paraplegia, neurogenic bladder, chronic hydronephrosis, ileal conduit, ostomy, HTN, HLD, GERD, CKD stage 3a admitted from wound care clinic on 05/12/2023 with enlarging, worsening with concern for acute infection to right and left ischial ulcers.   Chronic sacral osteomyelitis-may have acutely worsened   General surgery has been consulted-no recommendations for surgical intervention at this time   Has been started on appropriate antibiotic therapy for infection    Palliative medicine was consulted for assisting with goals of care conversations.  Subjective: Extensive chart review has been completed prior to meeting patient including labs, vital signs, imaging, progress notes, orders, and available advanced directive documents from current and previous encounters.    Visited with [name] at his bedside. Awake, alert, eating breakfast. Shares he did not sleep well last night but this is not new for him. Sister was able to visit yesterday and brought in his personal belongings. Looking forward to being able to listen to music today.  Revisited conversations had yesterday, he shares he took some time to continuing thinking about his wishes but did not speak with his sister regarding these conversations. He wishes to remain Full Code and needs more time to consider long term wishes/desires. He does not have any  questions or concerns at this time.  He shares ID has been consulted and hopes they are able to assist with wound healing as general surgery did not have anything to offer him at this time. He remains hopeful for wound healing and recovery.  Encouraged to reach out to PMT for any questions or concerns as they arise.  Thank you for allowing the Palliative Medicine Team to assist in the care of this patient.  Total time: 25 minutes    Time spent includes: Detailed review of medical records (labs, imaging, vital signs), medically appropriate exam (mental status, respiratory, cardiac, skin), discussed with treatment team, counseling and educating patient, family and staff, documenting clinical information, medication management and coordination of care.  Leeanne Deed, DNP, AGNP-C Palliative Medicine   Please contact Palliative Medicine Team phone at [phone] for questions and concerns.

## 2023-05-14 NOTE — Plan of Care (Signed)

## 2023-05-15 DIAGNOSIS — T148XXA Other injury of unspecified body region, initial encounter: Secondary | ICD-10-CM | POA: Diagnosis not present

## 2023-05-15 DIAGNOSIS — L089 Local infection of the skin and subcutaneous tissue, unspecified: Secondary | ICD-10-CM | POA: Diagnosis not present

## 2023-05-15 NOTE — Plan of Care (Signed)

## 2023-05-15 NOTE — Progress Notes (Signed)
PROGRESS NOTE    [name]  KGM:[id number] DOB: 1965/11/26 DOA: 05/12/2023 PCP: Marguarite Arbour, MD    Brief Narrative:   57 y.o. male with medical history significant of spina bifida, neurogenic bladder, chronic bilateral hydronephrosis, ostomy, hypertension, hyperlipidemia, GERD, CKD stage IIIa who presents for evaluation of increased drainage and size of known sacral ulcer.  Patient follows with the wound care clinic and was directed to the ED by the wound care physician when the wound was evaluated and noted to have increased substantially in size and have foul-smelling drainage.   On arrival in ED patient is not overtly septic.  He does deny pain.  He has significant neuropathy and no feeling of the sacrum.  He is hemodynamically stable with no signs of overt sepsis or shock.  CT abdomen pelvis performed without contrast in the emergency department (patient has a contrast allergy) demonstrates acute on chronic sacral osteomyelitis.  Patient was started on broad-spectrum IV antibiotics vancomycin and Zosyn and hospitalist contacted for admission.   Assessment & Plan:   Principal Problem:   Wound infection  Right ischial sacral ulcer Left ischial sacral ulcer Acute on chronic sacral osteomyelitis Difficult situation.  Patient clearly has signs of infection.  Sacral osteomyelitis is chronic and severe but may have worsened recently.  Patient is nonseptic/nontoxic-appearing.  Per general surgery no debridement warranted at this time.  Appreciate their recommendations Plan: Continue broad-spectrum IV antibiotics vancomycin and Zosyn We will continue broad-spectrum IV antibiotics for today.  Will engage with infectious disease on Monday 8/12 to investigate need for long-term IV antibiotics.  Unlikely the patient's wound will ever heal.  Severe hypokalemia, resolved Potassium 2.2, now increased to 3.7 No EKG changes plan: DC IVF   Spina bifida Chronic bedbound status WOC  consult as above Palliative care consult Remains full scope of care   Suspected malnutrition RD consultation   Essential hypertension Blood pressure has been running on the low side.  Continue holding hydrochlorothiazide.  Consider discontinuation on discharge   Hyperlipidemia PTA Crestor   GERD PPI   AKI on CKD stage IIIa Creatinine slightly above baseline, improving   DVT prophylaxis: SQ Lovenox Code Status: Full Family Communication: None Disposition Plan: Status is: Inpatient Remains inpatient appropriate because: Sacral wound infection   Level of care: Med-Surg  Consultants:  General surgery-[facility] Palliative care  Procedures:  None  Antimicrobials: Vancomycin Zosyn    Subjective: Seen and examined.  Resting comfortably in bed.  No visible distress  Objective: Vitals:   05/14/23 0923 05/14/23 1916 05/15/23 0341 05/15/23 0755  BP: (!) 82/54 (!) 85/59 (!) 82/43 (!) 85/42  Pulse: 71 79 65 63  Resp: 16 18 16 16   Temp:  98.6 F (37 C) 98.4 F (36.9 C) 98.1 F (36.7 C)  TempSrc:      SpO2: 100% 99% 97% 97%  Weight:      Height:        Intake/Output Summary (Last 24 hours) at 05/15/2023 1036 Last data filed at 05/15/2023 0630 Gross per 24 hour  Intake 437.02 ml  Output 200 ml  Net 237.02 ml   Filed Weights   05/12/23 1738  Weight: 53.5 kg    Examination:  General exam: NAD.  In good spirits.  Frail and chronically ill-appearing Respiratory system: Lungs clear.  Normal work of breathing.  Room air Cardiovascular system: S1-S2 RRR, no murmurs, no pedal edema Gastrointestinal system: Thin, soft, NT/ND, + ostomy Central nervous system: Alert and oriented. No focal neurological deficits.  Extremities: Decreased power bilateral lower extremities Skin: Significant bilateral ischial wounds Psychiatry: Judgement and insight appear normal. Mood & affect appropriate.     Data Reviewed: I have personally reviewed following labs and imaging  studies  CBC: Recent Labs  Lab 05/12/23 1626 05/13/23 0430 05/14/23 0850  WBC 9.4 6.1 6.5  NEUTROABS 6.9  --  4.3  HGB 12.8* 10.1* 10.3*  HCT 42.5 32.5* 35.2*  MCV 78.6* 76.3* 79.6*  PLT 599* 481* 485*   Basic Metabolic Panel: Recent Labs  Lab 05/12/23 1626 05/12/23 1725 05/13/23 0430 05/14/23 0415 05/14/23 0850  NA 136  --  139  --  140  K 2.2*  --  3.0*  --  3.7  CL 96*  --  106  --  110  CO2 26  --  24  --  21*  GLUCOSE 83  --  91  --  86  BUN 31*  --  25*  --  20  CREATININE 1.59*  --  1.53*  --  1.58*  CALCIUM 9.3  --  8.3*  --  8.2*  MG  --  2.6*  --   --   --   PHOS  --   --   --  2.4*  --    GFR: Estimated Creatinine Clearance: 32.2 mL/min (A) (by C-G formula based on SCr of 1.58 mg/dL (H)). Liver Function Tests: Recent Labs  Lab 05/12/23 1626 05/13/23 0430  AST 25 17  ALT 20 16  ALKPHOS 77 56  BILITOT 0.4 0.8  PROT 9.1* 6.4*  ALBUMIN 3.3* 2.3*   No results for input(s): "LIPASE", "AMYLASE" in the last 168 hours. No results for input(s): "AMMONIA" in the last 168 hours. Coagulation Profile: No results for input(s): "INR", "PROTIME" in the last 168 hours. Cardiac Enzymes: No results for input(s): "CKTOTAL", "CKMB", "CKMBINDEX", "TROPONINI" in the last 168 hours. BNP (last 3 results) No results for input(s): "PROBNP" in the last 8760 hours. HbA1C: No results for input(s): "HGBA1C" in the last 72 hours. CBG: No results for input(s): "GLUCAP" in the last 168 hours. Lipid Profile: No results for input(s): "CHOL", "HDL", "LDLCALC", "TRIG", "CHOLHDL", "LDLDIRECT" in the last 72 hours. Thyroid Function Tests: No results for input(s): "TSH", "T4TOTAL", "FREET4", "T3FREE", "THYROIDAB" in the last 72 hours. Anemia Panel: No results for input(s): "VITAMINB12", "FOLATE", "FERRITIN", "TIBC", "IRON", "RETICCTPCT" in the last 72 hours. Sepsis Labs: Recent Labs  Lab 05/12/23 1626 05/12/23 1843  LATICACIDVEN 1.8 1.5    Recent Results (from the past 240  hour(s))  Culture, blood (routine x 2)     Status: None (Preliminary result)   Collection Time: 05/12/23  3:32 PM   Specimen: BLOOD  Result Value Ref Range Status   Specimen Description BLOOD BLOOD LEFT FOREARM  Final   Special Requests   Final    BOTTLES DRAWN AEROBIC AND ANAEROBIC Blood Culture adequate volume   Culture   Final    NO GROWTH 3 DAYS Performed at [facility], [address], [place], [place] [address]    Report Status PENDING  Incomplete  Culture, blood (routine x 2)     Status: None (Preliminary result)   Collection Time: 05/12/23  5:25 PM   Specimen: BLOOD  Result Value Ref Range Status   Specimen Description BLOOD BLOOD LEFT HAND  Final   Special Requests   Final    BOTTLES DRAWN AEROBIC AND ANAEROBIC Blood Culture adequate volume   Culture   Final    NO  GROWTH 3 DAYS Performed at [facility], [address] [place], [place] [address]    Report Status PENDING  Incomplete  Aerobic Culture w Gram Stain (superficial specimen)     Status: None (Preliminary result)   Collection Time: 05/13/23 11:35 AM   Specimen: Wound  Result Value Ref Range Status   Specimen Description   Final    WOUND Performed at [facility], [address], [place], [place] [address]    Special Requests   Final    NONE Performed at [facility], [address] [place], [place] [address]    Gram Stain   Final    RARE WBC PRESENT,BOTH PMN AND MONONUCLEAR RARE GRAM POSITIVE COCCI Performed at [facility], [address], [place], [place] [address]    Culture PENDING  Incomplete   Report Status PENDING  Incomplete         Radiology Studies: No results found.      Scheduled Meds:  vitamin C  500 mg Oral BID   enoxaparin (LOVENOX) injection  40 mg Subcutaneous Q24H   folic acid  1 mg Oral Daily   lactose free nutrition  237 mL Oral TID WC   multivitamin with minerals  1 tablet Oral Daily   pantoprazole  40 mg Oral Daily    rosuvastatin  10 mg Oral QHS   simethicone  80 mg Oral QID   sodium hypochlorite   Topical 1 day or 1 dose   sucralfate  1 g Oral TID   thiamine  100 mg Oral Daily   traZODone  50 mg Oral QHS   zinc sulfate  220 mg Oral Daily   Continuous Infusions:  piperacillin-tazobactam (ZOSYN)  IV 12.5 mL/hr at 05/15/23 0630   vancomycin 200 mL/hr at 05/15/23 0630     LOS: 3 days      Tresa Moore, MD [facility]   If 7PM-7AM, please contact night-coverage  05/15/2023, 10:36 AM

## 2023-05-16 DIAGNOSIS — L089 Local infection of the skin and subcutaneous tissue, unspecified: Secondary | ICD-10-CM | POA: Diagnosis not present

## 2023-05-16 DIAGNOSIS — S31000A Unspecified open wound of lower back and pelvis without penetration into retroperitoneum, initial encounter: Secondary | ICD-10-CM

## 2023-05-16 DIAGNOSIS — T148XXA Other injury of unspecified body region, initial encounter: Secondary | ICD-10-CM | POA: Diagnosis not present

## 2023-05-16 LAB — GLUCOSE, CAPILLARY: Glucose-Capillary: 120 mg/dL — ABNORMAL HIGH (ref 70–99)

## 2023-05-16 IMAGING — CT CT PELVIS W/O CM
2 of 6 series · 15 of 46 positions shown, 17 images · non-contrast
Comparison: CT, 03/07/2021.

CLINICAL DATA: Soft tissue infection suspected. [place]ore to the
gluteal region with associated pain.

EXAM:
CT PELVIS WITHOUT CONTRAST
TECHNIQUE: Multidetector CT imaging of the pelvis was performed following the
standard protocol without intravenous contrast.

[Series 3: axial st · axial · 0.74mm/px · z∈[-394,-166]mm · 12 of 132 slices shown, 14 images]
[im 9/132  soft-tissue]
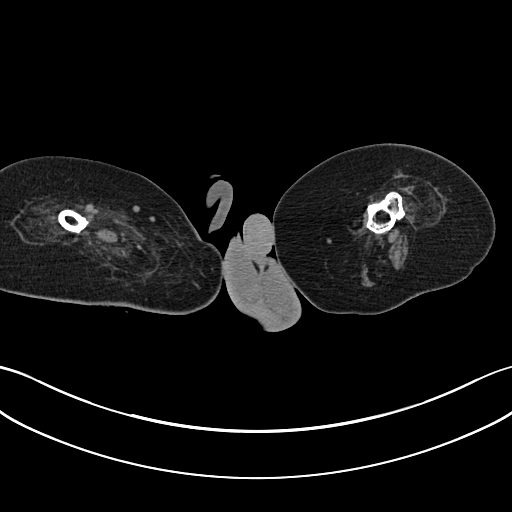
[im 9/132  bone]
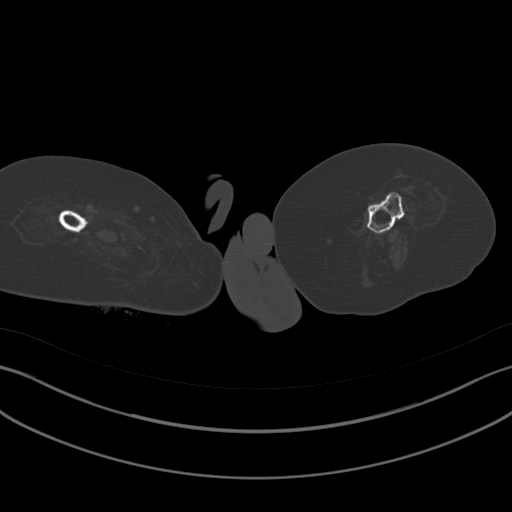
[im 18/132  soft-tissue]
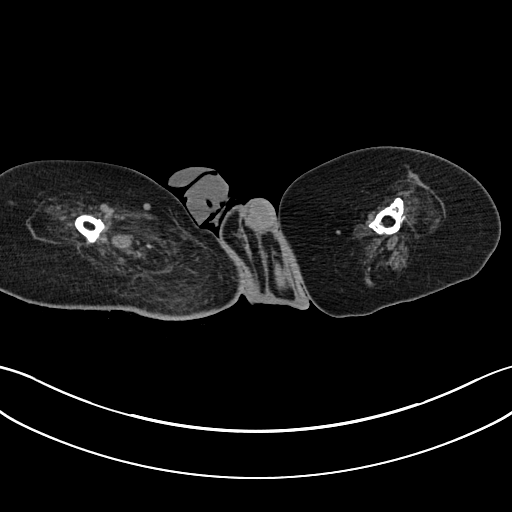
[im 27/132  soft-tissue]
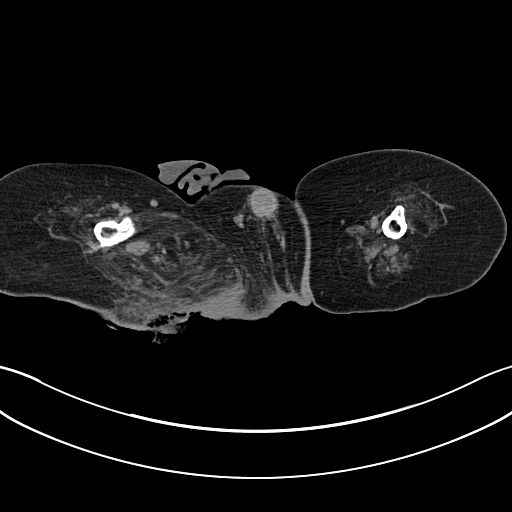
[im 44/132  soft-tissue]
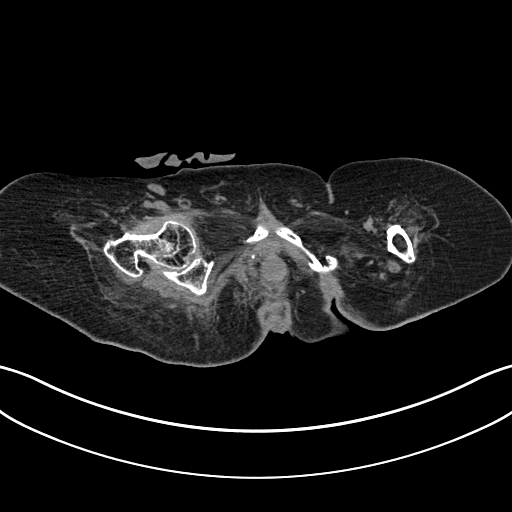
[im 53/132  soft-tissue]
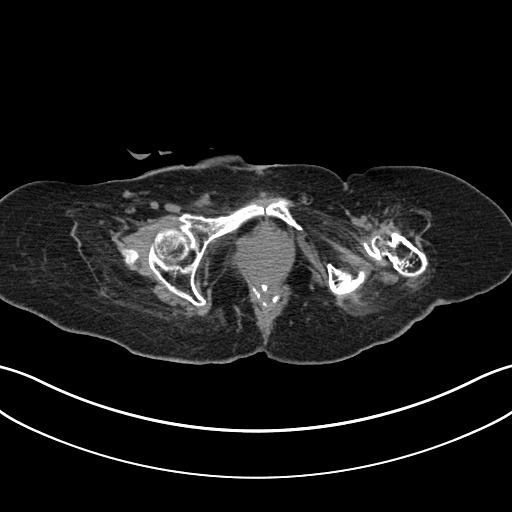
[im 62/132  soft-tissue]
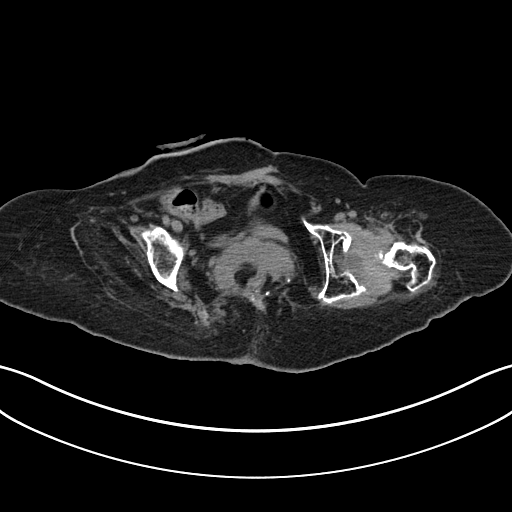
[im 70/132  soft-tissue]
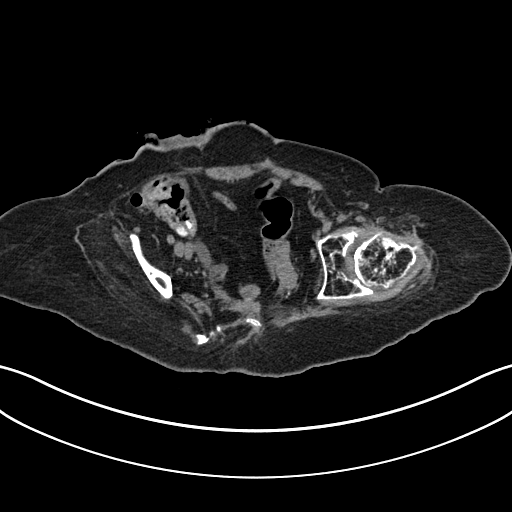
[im 79/132  soft-tissue]
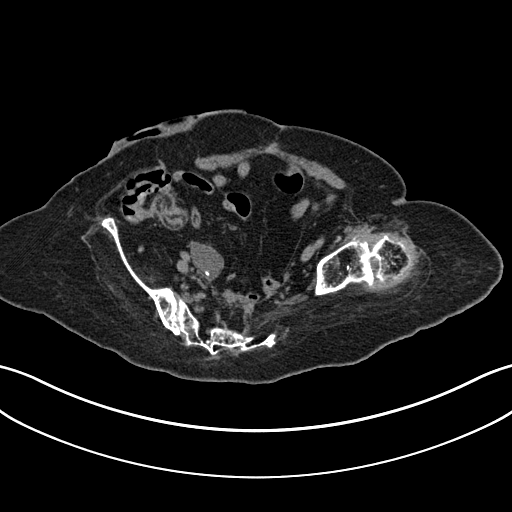
[im 88/132  soft-tissue]
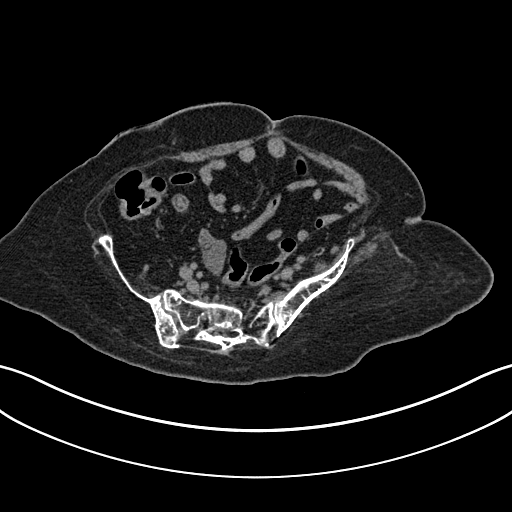
[im 88/132  bone]
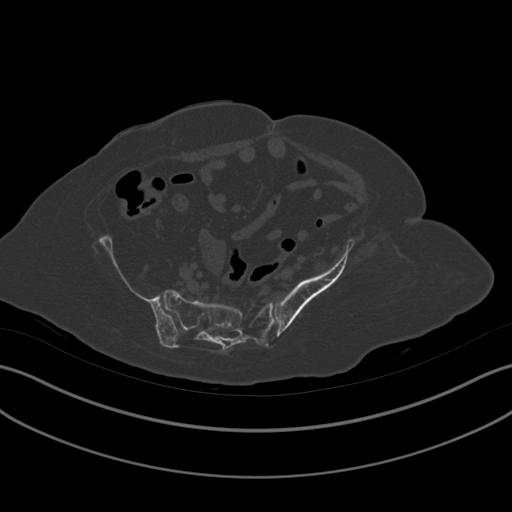
[im 105/132  soft-tissue]
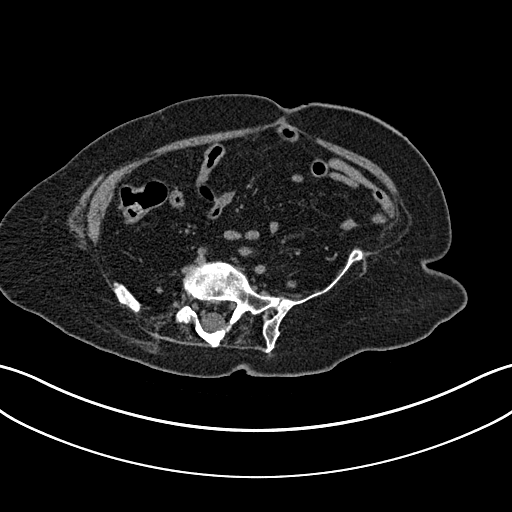
[im 114/132  soft-tissue]
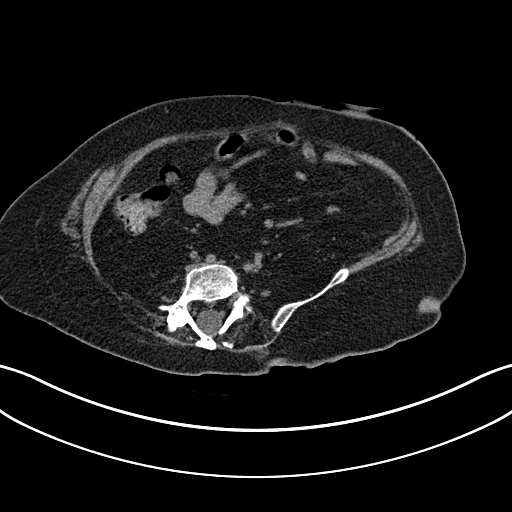
[im 123/132  soft-tissue]
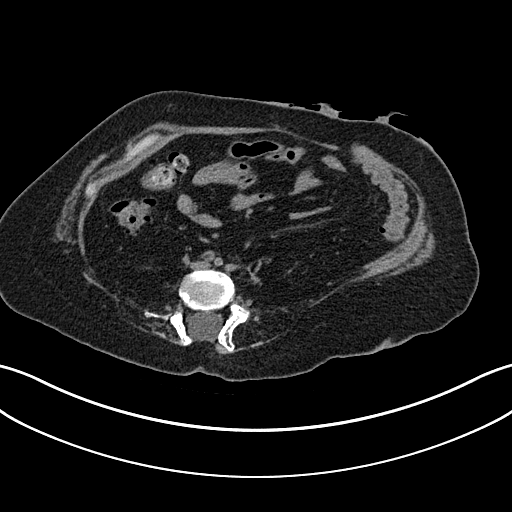

[Series 6: coronal st · coronal · 0.52mm/px · 3 of 113 slices shown]
[im 29/113  soft-tissue]
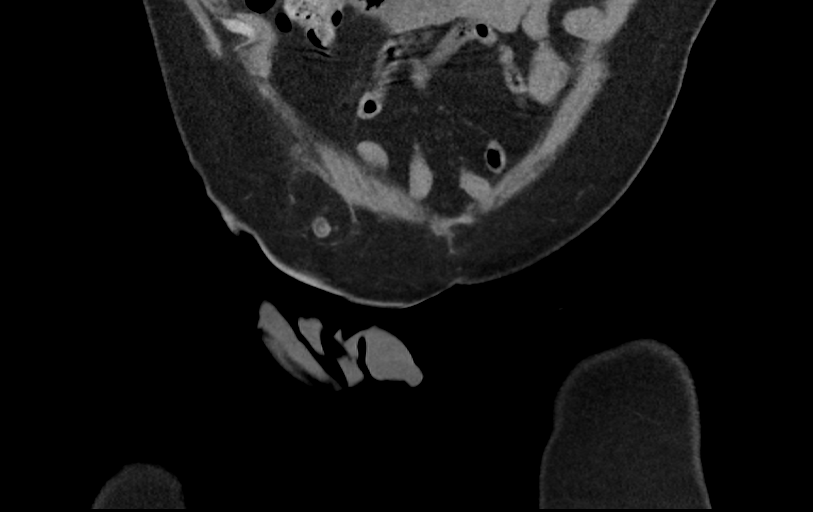
[im 57/113  soft-tissue]
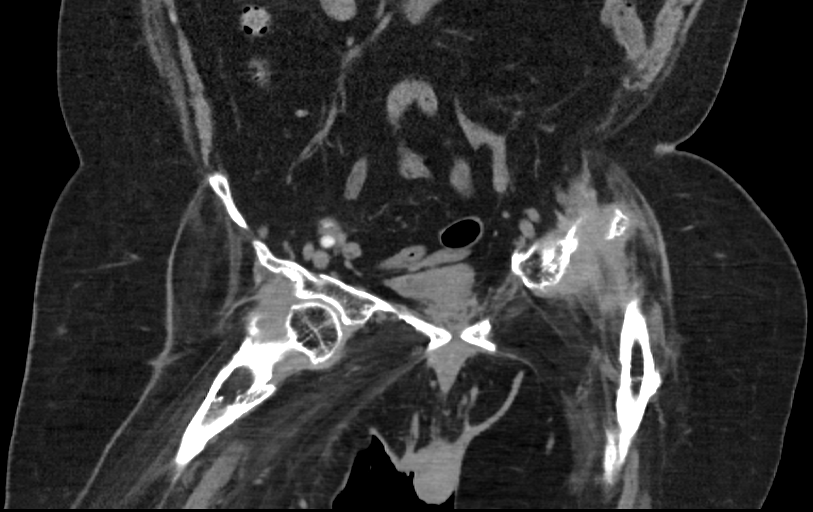
[im 85/113  soft-tissue]
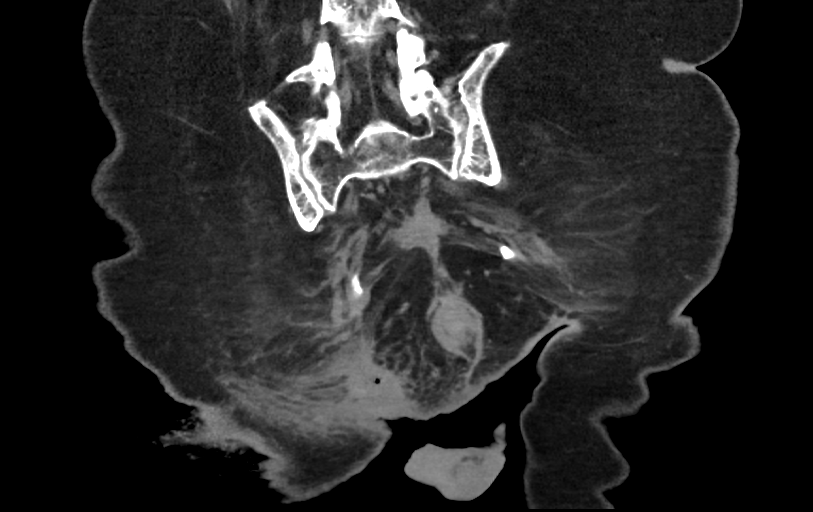

[15 of 46 positions shown; findings below may reference images not displayed]

FINDINGS: Urinary Tract: Right lower quadrant ileostomy. Visualized ureters
converge on an ileo conduit anterior to the pelvic brim. Visualized
ureters are normal in caliber. Inferior aspects of the kidneys show
calcifications similar to the prior CT. Bladder is mostly
decompressed.

Bowel: No evidence of bowel obstruction or inflammation. Descending
colon extends into a left lower quadrant colostomy, stable.

Vascular/Lymphatic: No significant vascular abnormality.

No enlarged lymph nodes.

Reproductive:  Enlarged prostate, stable.

Other:  No free fluid in the visualized abdomen or pelvis.

Musculoskeletal: Decubitus ulcer tracks from the inferior right
buttock to the posteroinferior ischium where there is bone
resorption consistent with osteomyelitis. Ulceration shows
surrounding inflammation, but no defined collection to suggest an
abscess. This is new compared to the prior CT.

Soft tissue crease extends from the inferior left buttock to the
posteroinferior left ischium with associated bony reactive changes.
There are advanced arthropathic changes of the left hip that appears
due to dysplasia. The acetabula show all, femoral head
flattened/remodeled, with surrounding synovial thickening and a
joint effusion, all findings stable compared to the prior CT.
IMPRESSION: 1. Right inferior buttock decubitus ulcer extends to the
posteroinferior right ischium where there is evidence of
osteomyelitis.
2. No evidence of an abscess.
3. No other acute abnormality of the pelvis.
4. Chronic findings including left hip changes as detailed, stable
from the prior CT.

## 2023-05-16 MED ORDER — MEDIHONEY WOUND/BURN DRESSING EX PSTE
1.0000 | PASTE | Freq: Every day | CUTANEOUS | Status: DC
  Filled 2023-05-16: qty 44

## 2023-05-16 MED ORDER — POTASSIUM CHLORIDE CRYS ER 20 MEQ PO TBCR
40.0000 meq | EXTENDED_RELEASE_TABLET | ORAL | Status: AC
  Administered 2023-05-16 (×2): 40 meq via ORAL
  Filled 2023-05-16 (×2): qty 2

## 2023-05-16 NOTE — Progress Notes (Signed)
PROGRESS NOTE    [name]  LKG:[id number] DOB: 06-24-1966 DOA: 05/12/2023 PCP: Marguarite Arbour, MD    Brief Narrative:   57 y.o. male with medical history significant of spina bifida, neurogenic bladder, chronic bilateral hydronephrosis, ostomy, hypertension, hyperlipidemia, GERD, CKD stage IIIa who presents for evaluation of increased drainage and size of known sacral ulcer.  Patient follows with the wound care clinic and was directed to the ED by the wound care physician when the wound was evaluated and noted to have increased substantially in size and have foul-smelling drainage.   On arrival in ED patient is not overtly septic.  He does deny pain.  He has significant neuropathy and no feeling of the sacrum.  He is hemodynamically stable with no signs of overt sepsis or shock.  CT abdomen pelvis performed without contrast in the emergency department (patient has a contrast allergy) demonstrates acute on chronic sacral osteomyelitis.  Patient was started on broad-spectrum IV antibiotics vancomycin and Zosyn and hospitalist contacted for admission.   Assessment & Plan:   Principal Problem:   Wound infection  Right ischial sacral ulcer Left ischial sacral ulcer Acute on chronic sacral osteomyelitis Difficult situation.  Patient clearly has signs of infection.  Sacral osteomyelitis is chronic and severe but may have worsened recently.  Patient is nonseptic/nontoxic-appearing.  Per general surgery no debridement warranted at this time.  Appreciate their recommendations Plan: Continue broad-spectrum IV antibiotics vancomycin and Zosyn We will continue broad-spectrum IV antibiotics for today.   ID consulted today.  Unlikely the patient's wound will ever heal. Not amenable to any surgical intervention  Severe hypokalemia, recurrent Potassium 2.2, increased to 3.7 then back down to 2.9 No EKG changes plan: P.o. K. Dur 40 mill equivalents x 2 doses   Spina bifida Chronic  bedbound status WOC consult as above Palliative care consult Remains full scope of care   Suspected malnutrition RD consultation   Essential hypertension Blood pressure has been running on the low side.   Continue holding hydrochlorothiazide.   Consider discontinuation on discharge   Hyperlipidemia PTA Crestor   GERD PPI   AKI on CKD stage IIIa Creatinine slightly above baseline, improving   DVT prophylaxis: SQ Lovenox Code Status: Full Family Communication: None Disposition Plan: Status is: Inpatient Remains inpatient appropriate because: Acute on chronic sacral osteomyelitis   Level of care: Med-Surg  Consultants:  General surgery-[facility] Palliative care  Procedures:  None  Antimicrobials: Vancomycin Zosyn    Subjective: Seen and examined.  Resting comfortably in bed.  No visible distress  Objective: Vitals:   05/15/23 1641 05/15/23 2045 05/16/23 0356 05/16/23 0758  BP: (!) 84/52 (!) 95/48 (!) 95/54 (!) 93/51  Pulse: 74 60 60 63  Resp: 16 20 16 18   Temp:  98 F (36.7 C) 98.3 F (36.8 C) 97.9 F (36.6 C)  TempSrc:  Axillary Axillary Oral  SpO2: 100% 98% 99% 98%  Weight:      Height:        Intake/Output Summary (Last 24 hours) at 05/16/2023 1032 Last data filed at 05/16/2023 6644 Gross per 24 hour  Intake 0 ml  Output 1300 ml  Net -1300 ml   Filed Weights   05/12/23 1738  Weight: 53.5 kg    Examination:  General exam: No acute distress.  Appears frail and chronically ill Respiratory system: Lungs clear.  Normal work of breathing.  Room air Cardiovascular system: S1-S2 RRR, no murmurs, no pedal edema Gastrointestinal system: Thin, soft, NT/ND, + ostomy Central  nervous system: Alert and oriented. No focal neurological deficits. Extremities: Decreased power bilateral lower extremities Skin: Significant bilateral ischial wounds Psychiatry: Judgement and insight appear normal. Mood & affect appropriate.     Data Reviewed: I have  personally reviewed following labs and imaging studies  CBC: Recent Labs  Lab 05/12/23 1626 05/13/23 0430 05/14/23 0850  WBC 9.4 6.1 6.5  NEUTROABS 6.9  --  4.3  HGB 12.8* 10.1* 10.3*  HCT 42.5 32.5* 35.2*  MCV 78.6* 76.3* 79.6*  PLT 599* 481* 485*   Basic Metabolic Panel: Recent Labs  Lab 05/12/23 1626 05/12/23 1725 05/13/23 0430 05/14/23 0415 05/14/23 0850 05/16/23 0403  NA 136  --  139  --  140 140  K 2.2*  --  3.0*  --  3.7 2.9*  CL 96*  --  106  --  110 109  CO2 26  --  24  --  21* 23  GLUCOSE 83  --  91  --  86 114*  BUN 31*  --  25*  --  20 14  CREATININE 1.59*  --  1.53*  --  1.58* 1.42*  CALCIUM 9.3  --  8.3*  --  8.2* 8.5*  MG  --  2.6*  --   --   --   --   PHOS  --   --   --  2.4*  --   --    GFR: Estimated Creatinine Clearance: 35.8 mL/min (A) (by C-G formula based on SCr of 1.42 mg/dL (H)). Liver Function Tests: Recent Labs  Lab 05/12/23 1626 05/13/23 0430  AST 25 17  ALT 20 16  ALKPHOS 77 56  BILITOT 0.4 0.8  PROT 9.1* 6.4*  ALBUMIN 3.3* 2.3*   No results for input(s): "LIPASE", "AMYLASE" in the last 168 hours. No results for input(s): "AMMONIA" in the last 168 hours. Coagulation Profile: No results for input(s): "INR", "PROTIME" in the last 168 hours. Cardiac Enzymes: No results for input(s): "CKTOTAL", "CKMB", "CKMBINDEX", "TROPONINI" in the last 168 hours. BNP (last 3 results) No results for input(s): "PROBNP" in the last 8760 hours. HbA1C: No results for input(s): "HGBA1C" in the last 72 hours. CBG: Recent Labs  Lab 05/16/23 0838  GLUCAP 120*   Lipid Profile: No results for input(s): "CHOL", "HDL", "LDLCALC", "TRIG", "CHOLHDL", "LDLDIRECT" in the last 72 hours. Thyroid Function Tests: No results for input(s): "TSH", "T4TOTAL", "FREET4", "T3FREE", "THYROIDAB" in the last 72 hours. Anemia Panel: No results for input(s): "VITAMINB12", "FOLATE", "FERRITIN", "TIBC", "IRON", "RETICCTPCT" in the last 72 hours. Sepsis Labs: Recent Labs   Lab 05/12/23 1626 05/12/23 1843  LATICACIDVEN 1.8 1.5    Recent Results (from the past 240 hour(s))  Culture, blood (routine x 2)     Status: None (Preliminary result)   Collection Time: 05/12/23  3:32 PM   Specimen: BLOOD  Result Value Ref Range Status   Specimen Description BLOOD BLOOD LEFT FOREARM  Final   Special Requests   Final    BOTTLES DRAWN AEROBIC AND ANAEROBIC Blood Culture adequate volume   Culture   Final    NO GROWTH 4 DAYS Performed at [facility], [address] [place], [place] [address]    Report Status PENDING  Incomplete  Culture, blood (routine x 2)     Status: None (Preliminary result)   Collection Time: 05/12/23  5:25 PM   Specimen: BLOOD  Result Value Ref Range Status   Specimen Description BLOOD BLOOD LEFT HAND  Final   Special  Requests   Final    BOTTLES DRAWN AEROBIC AND ANAEROBIC Blood Culture adequate volume   Culture   Final    NO GROWTH 4 DAYS Performed at [facility], [address] [place], [place] [address]    Report Status PENDING  Incomplete  Aerobic Culture w Gram Stain (superficial specimen)     Status: None (Preliminary result)   Collection Time: 05/13/23 11:35 AM   Specimen: Wound  Result Value Ref Range Status   Specimen Description   Final    WOUND Performed at [facility], [address], [place], [place] [address]    Special Requests   Final    NONE Performed at [facility], [address] [place], [place] [address]    Gram Stain   Final    RARE WBC PRESENT,BOTH PMN AND MONONUCLEAR RARE GRAM POSITIVE COCCI    Culture   Final    FEW PROTEUS MIRABILIS SUSCEPTIBILITIES TO FOLLOW Performed at [facility], [address], [place], [place] [address]    Report Status PENDING  Incomplete         Radiology Studies: No results found.      Scheduled Meds:  vitamin C  500 mg Oral BID   enoxaparin (LOVENOX) injection  40 mg Subcutaneous Q24H   folic acid  1 mg  Oral Daily   lactose free nutrition  237 mL Oral TID WC   [START ON 05/18/2023] leptospermum manuka honey  1 Application Topical Daily   multivitamin with minerals  1 tablet Oral Daily   pantoprazole  40 mg Oral Daily   potassium chloride  40 mEq Oral Q2H   rosuvastatin  10 mg Oral QHS   simethicone  80 mg Oral QID   sodium hypochlorite   Topical 1 day or 1 dose   sucralfate  1 g Oral TID   thiamine  100 mg Oral Daily   traZODone  50 mg Oral QHS   zinc sulfate  220 mg Oral Daily   Continuous Infusions:  piperacillin-tazobactam (ZOSYN)  IV 3.375 g (05/16/23 0557)   vancomycin 200 mL/hr at 05/15/23 1749     LOS: 4 days      Tresa Moore, MD [facility]   If 7PM-7AM, please contact night-coverage  05/16/2023, 10:32 AM

## 2023-05-16 NOTE — Care Management Important Message (Signed)
Important Message  Patient Details  Name: [name] MRN: [id number] Date of Birth: 02/11/1966   Medicare Important Message Given:  Yes     [name] 05/16/2023, 10:57 AM

## 2023-05-16 NOTE — Consult Note (Addendum)
WOC followup: Refer to previous WOC consult on 8/9. Performed remotely after review of the EMR. Topical treatment orders were provided for bedside nurses to perform Dakins moistened gauze for 5 days at that time.  Surgical consult was performed on 8/9 and they had no further recommendations.  Updated topical treatment orders provided for bedside nurses to perform as follows after Dakins order is expired: Begin on 8/14 after Dakins is completed. Medihoney to left and right ischium Q day, then cover with ABD pad and tape. Please re-consult if further assistance is needed.  Thank-you,  Cammie Mcgee MSN, RN, CWOCN, [place], CNS [phone]

## 2023-05-16 NOTE — Plan of Care (Signed)

## 2023-05-16 NOTE — Consult Note (Incomplete)
NAME: [name]  DOB: Jan 11, 1966  MRN: [id number]  Date/Time: 05/16/2023 7:40 PM  REQUESTING PROVIDER: Dr.Sreenath Subjective:  REASON FOR CONSULT: Sacral wounds ? [name] is a 57 y.o. with a history of Spina bifida, neurogenic bladder requiring cystectomy, ileal conduit urinary diversion, chronic bilateral hydronephrosis and bilateral nephrolithiasis, status post PCNL status post colostomy, hypertension, hyperlipidemia, GERD, CKD 3 , chronic sacral decubitus ulcers presented from the wound clinic because of worsening wounds on 05/12/23 He had taken a course of cipro prescribed by the wound clinic On presentation vitals  05/12/23  BP 103/62  Temp 98 F (36.7 C)  Pulse Rate 79  Resp 18  SpO2 99 %    Latest Reference Range & Units 05/12/23  WBC 4.0 - 10.5 K/uL 9.4  Hemoglobin 13.0 - 17.0 g/dL 78.2 (L)  HCT 95.6 - 21.3 % 42.5  Platelets 150 - 400 K/uL 599 (H)  Creatinine 0.61 - 1.24 mg/dL 0.86 (H)  CT scan showed Large deep decubitus ulcer underlying the right ischium with progressive bone loss at the ischial tuberosity and inferior pubic ramus on the right with increased sclerosis of the adjacent bone compatible with acute on chronic osteomyelitis.  Seen by surgery who did not think any surgical intervention was needed started on vanco/zosyn  I am seeing him for the same  Past Medical History:  Diagnosis Date   Abdominal distention 10/11/2016   Acute lower UTI 12/22/2019   Acute sepsis (HCC) 04/21/2016   AKI (acute kidney injury) (HCC) 06/27/2020   Decubitus ulcer    Distal intestinal obstruction syndrome (HCC) 10/11/2016   Fecal impaction in rectum (HCC) 10/10/2016   Gastric outlet obstruction 06/03/2016   Gastritis    GERD (gastroesophageal reflux disease) 12/22/2019   HTN (hypertension) 06/27/2020   Hydronephrosis    Hyperlipidemia    Hypokalemia 10/11/2016   Impaction of the bowels (HCC)    Kidney stone    Left ureteral stone 06/27/2020   Leukocytosis 10/11/2016    Loose stools 10/11/2016   Nausea & vomiting 06/03/2016   Renal disorder    SBO (small bowel obstruction) (HCC) 11/02/2017   Sepsis due to gram-negative UTI (HCC) 03/08/2021   Septic shock (HCC) 03/08/2021   SIRS (systemic inflammatory response syndrome) (HCC) 06/03/2016   Spina bifida (HCC)    Ulcer of esophagus with bleeding     Past Surgical History:  Procedure Laterality Date   ABDOMINAL SURGERY     APPENDECTOMY     BACK SURGERY     COLON SURGERY     CREATION / REVISION OF ILEOSTOMY / JEJUNOSTOMY  05/2015   CYSTOSCOPY WITH RETROGRADE PYELOGRAM, URETEROSCOPY AND STENT PLACEMENT  03/2014   ESOPHAGOGASTRODUODENOSCOPY (EGD) WITH PROPOFOL N/A 06/03/2016   Procedure: ESOPHAGOGASTRODUODENOSCOPY (EGD) WITH PROPOFOL;  Surgeon: Midge Minium, MD;  Location: [facility] ENDOSCOPY;  Service: Endoscopy;  Laterality: N/A;   ESOPHAGOGASTRODUODENOSCOPY (EGD) WITH PROPOFOL N/A 04/27/2019   Procedure: ESOPHAGOGASTRODUODENOSCOPY (EGD) WITH PROPOFOL;  Surgeon: Wyline Mood, MD;  Location: [facility] ENDOSCOPY;  Service: Gastroenterology;  Laterality: N/A;   ilieostomy     PERCUTANEOUS NEPHROSTOLITHOTOMY     PERCUTANEOUS NEPHROSTOMY     VENTRICULOPERITONEAL SHUNT     vp shunt removal      Social History   Socioeconomic History   Marital status: Single    Spouse name: Not on file   Number of children: Not on file   Years of education: Not on file   Highest education level: Not on file  Occupational History   Occupation: disabled  Tobacco Use   Smoking status: Never   Smokeless tobacco: Never  Substance and Sexual Activity   Alcohol use: No   Drug use: No   Sexual activity: Never  Other Topics Concern   Not on file  Social History Narrative   Not on file   Social Determinants of Health   Financial Resource Strain: Not on file  Food Insecurity: No Food Insecurity (06/15/2019)   Received from [facility], [facility]   Hunger Vital Sign    Worried About Running Out of Food in the Last Year: Never  true    Ran Out of Food in the Last Year: Never true  Transportation Needs: Not on file  Physical Activity: Not on file  Stress: Not on file  Social Connections: Not on file  Intimate Partner Violence: Not on file    Family History  Problem Relation Age of Onset   Diabetes Mellitus II Father    Bladder Cancer Neg Hx    Kidney cancer Neg Hx    Prostate cancer Neg Hx    Allergies  Allergen Reactions   Morphine And Codeine Nausea And Vomiting   Sulfa Antibiotics Other (See Comments)    Reaction: unknown   Ivp Dye [Iodinated Contrast Media] Hives   Latex Rash   I? Current Facility-Administered Medications  Medication Dose Route Frequency Provider Last Rate Last Admin   acetaminophen (TYLENOL) tablet 650 mg  650 mg Oral Q6H PRN Lolita Patella B, MD       Or   acetaminophen (TYLENOL) suppository 650 mg  650 mg Rectal Q6H PRN Sreenath, Sudheer B, MD       albuterol (PROVENTIL) (2.5 MG/3ML) 0.083% nebulizer solution 2.5 mg  2.5 mg Nebulization Q2H PRN Sreenath, Sudheer B, MD       ascorbic acid (VITAMIN C) tablet 500 mg  500 mg Oral BID Georgeann Oppenheim, Sudheer B, MD   500 mg at 05/16/23 0916   enoxaparin (LOVENOX) injection 40 mg  40 mg Subcutaneous Q24H Lolita Patella B, MD   40 mg at 05/15/23 2131   folic acid (FOLVITE) tablet 1 mg  1 mg Oral Daily Lolita Patella B, MD   1 mg at 05/16/23 0916   HYDROcodone-acetaminophen (NORCO/VICODIN) 5-325 MG per tablet 1-2 tablet  1-2 tablet Oral Q4H PRN Lolita Patella B, MD   1 tablet at 05/13/23 1129   lactose free nutrition (BOOST PLUS) liquid 237 mL  237 mL Oral TID WC Sreenath, Sudheer B, MD   237 mL at 05/16/23 1728   [START ON 05/18/2023] leptospermum manuka honey (MEDIHONEY) paste 1 Application  1 Application Topical Daily Sreenath, Sudheer B, MD       multivitamin with minerals tablet 1 tablet  1 tablet Oral Daily Lolita Patella B, MD   1 tablet at 05/16/23 0915   ondansetron (ZOFRAN) tablet 4 mg  4 mg Oral Q6H PRN Lolita Patella  B, MD       Or   ondansetron (ZOFRAN) injection 4 mg  4 mg Intravenous Q6H PRN Sreenath, Sudheer B, MD       pantoprazole (PROTONIX) EC tablet 40 mg  40 mg Oral Daily Georgeann Oppenheim, Sudheer B, MD   40 mg at 05/16/23 0916   piperacillin-tazobactam (ZOSYN) IVPB 3.375 g  3.375 g Intravenous Q8H Nazari, Walid A, RPH 12.5 mL/hr at 05/16/23 1456 3.375 g at 05/16/23 1456   rosuvastatin (CRESTOR) tablet 10 mg  10 mg Oral QHS Sreenath, Sudheer B, MD   10 mg at  05/15/23 2131   simethicone (MYLICON) chewable tablet 80 mg  80 mg Oral QID Lolita Patella B, MD   80 mg at 05/16/23 1723   sodium hypochlorite (DAKIN'S 1/4 STRENGTH) topical solution   Topical 1 day or 1 dose Lolita Patella B, MD   Given at 05/16/23 0918   sucralfate (CARAFATE) tablet 1 g  1 g Oral TID Lolita Patella B, MD   1 g at 05/16/23 1457   thiamine (VITAMIN B1) tablet 100 mg  100 mg Oral Daily Lolita Patella B, MD   100 mg at 05/16/23 0916   traZODone (DESYREL) tablet 50 mg  50 mg Oral QHS Lolita Patella B, MD   50 mg at 05/16/23 0001   zinc sulfate capsule 220 mg  220 mg Oral Daily Lolita Patella B, MD   220 mg at 05/16/23 1610     Abtx:  Anti-infectives (From admission, onward)    Start     Dose/Rate Route Frequency Ordered Stop   05/14/23 1800  vancomycin (VANCOCIN) IVPB 1000 mg/200 mL premix  Status:  Discontinued        1,000 mg 200 mL/hr over 60 Minutes Intravenous Every 48 hours 05/12/23 1825 05/16/23 1519   05/12/23 2200  piperacillin-tazobactam (ZOSYN) IVPB 3.375 g        3.375 g 12.5 mL/hr over 240 Minutes Intravenous Every 8 hours 05/12/23 1825     05/12/23 1600  piperacillin-tazobactam (ZOSYN) IVPB 3.375 g        3.375 g 100 mL/hr over 30 Minutes Intravenous  Once 05/12/23 1547 05/12/23 1739   05/12/23 1600  vancomycin (VANCOCIN) IVPB 1000 mg/200 mL premix        1,000 mg 200 mL/hr over 60 Minutes Intravenous  Once 05/12/23 1547 05/12/23 1813       REVIEW OF SYSTEMS:  Const: negative fever, negative  chills, negative weight loss Eyes: negative diplopia or visual changes, negative eye pain ENT: negative coryza, negative sore throat Resp: negative cough, hemoptysis, dyspnea Cards: negative for chest pain, palpitations, lower extremity edema GU: has ileal conduit GI: Negative for abdominal pain, diarrhea, bleeding, constipation Has colostomy Skin: negative for rash and pruritus Heme: negative for easy bruising and gum/nose bleeding MS: negative for myalgias, arthralgias, back pain and muscle weakness Neurolo:paraplegia Psych: negative for feelings of anxiety, depression  Endocrine: negative for thyroid, diabetes Allergy/Immunology- as above Objective:  VITALS:  BP (!) 89/44   Pulse 72   Temp 98.3 F (36.8 C) (Oral)   Resp 16   Ht 4\' 6"  (1.372 m)   Wt 53.5 kg   SpO2 100%   BMI 28.45 kg/m   PHYSICAL EXAM:  General: Alert, cooperative, no distress, appears stated age.  Head: Normocephalic, without obvious abnormality, atraumatic. Eyes: strabismus Neck: symmetrical, no adenopathy, thyroid: non tender no carotid bruit and no JVD. Back: No CVA tenderness. Lungs: Clear to auscultation bilaterally. No Wheezing or Rhonchi. No rales. Heart: Regular rate and rhythm, no murmur, rub or gallop. Abdomen: Soft, colostomy and ileostomy Extremities: wasting of lower extremities Skin:     Wound on the sacrum left ischium- bone not felt  60%  granulation tissue and some slough CNS- paraplegia    Lymph: Cervical, supraclavicular normal. Neurologic: Grossly non-focal Pertinent Labs Lab Results CBC    Component Value Date/Time   WBC 6.5 05/14/2023 0850   RBC 4.42 05/14/2023 0850   HGB 10.3 (L) 05/14/2023 0850   HCT 35.2 (L) 05/14/2023 0850   PLT 485 (H) 05/14/2023 0850  MCV 79.6 (L) 05/14/2023 0850   MCH 23.3 (L) 05/14/2023 0850   MCHC 29.3 (L) 05/14/2023 0850   RDW 16.7 (H) 05/14/2023 0850   LYMPHSABS 1.3 05/14/2023 0850   MONOABS 0.4 05/14/2023 0850   EOSABS 0.4  05/14/2023 0850   BASOSABS 0.1 05/14/2023 0850       Latest Ref Rng & Units 05/16/2023    4:03 AM 05/14/2023    8:50 AM 05/13/2023    4:30 AM  CMP  Glucose 70 - 99 mg/dL 161  86  91   BUN 6 - 20 mg/dL 14  20  25    Creatinine 0.61 - 1.24 mg/dL 0.96  0.45  4.09   Sodium 135 - 145 mmol/L 140  140  139   Potassium 3.5 - 5.1 mmol/L 2.9  3.7  3.0   Chloride 98 - 111 mmol/L 109  110  106   CO2 22 - 32 mmol/L 23  21  24    Calcium 8.9 - 10.3 mg/dL 8.5  8.2  8.3   Total Protein 6.5 - 8.1 g/dL   6.4   Total Bilirubin 0.3 - 1.2 mg/dL   0.8   Alkaline Phos 38 - 126 U/L   56   AST 15 - 41 U/L   17   ALT 0 - 44 U/L   16       Microbiology: Recent Results (from the past 240 hour(s))  Culture, blood (routine x 2)     Status: None (Preliminary result)   Collection Time: 05/12/23  3:32 PM   Specimen: BLOOD  Result Value Ref Range Status   Specimen Description BLOOD BLOOD LEFT FOREARM  Final   Special Requests   Final    BOTTLES DRAWN AEROBIC AND ANAEROBIC Blood Culture adequate volume   Culture   Final    NO GROWTH 4 DAYS Performed at [facility], [address] [place], [place] [address]    Report Status PENDING  Incomplete  Culture, blood (routine x 2)     Status: None (Preliminary result)   Collection Time: 05/12/23  5:25 PM   Specimen: BLOOD  Result Value Ref Range Status   Specimen Description BLOOD BLOOD LEFT HAND  Final   Special Requests   Final    BOTTLES DRAWN AEROBIC AND ANAEROBIC Blood Culture adequate volume   Culture   Final    NO GROWTH 4 DAYS Performed at [facility], [address] [place], [place] [address]    Report Status PENDING  Incomplete  Aerobic Culture w Gram Stain (superficial specimen)     Status: None (Preliminary result)   Collection Time: 05/13/23 11:35 AM   Specimen: Wound  Result Value Ref Range Status   Specimen Description   Final    WOUND Performed at [facility], [address] [place], [place] [address]     Special Requests   Final    NONE Performed at [facility], [address] [place], [place] [address]    Gram Stain   Final    RARE WBC PRESENT,BOTH PMN AND MONONUCLEAR RARE GRAM POSITIVE COCCI    Culture   Final    FEW PROTEUS MIRABILIS SUSCEPTIBILITIES TO FOLLOW Performed at [facility], [address] [place], [place] [address]    Report Status PENDING  Incomplete    IMAGING RESULTS: CT scan pelvis  I have personally reviewed the films ? Impression/Recommendation Pt with spina bifida neurogenic bladder requiring cystectomy, ileal conduit urinary diversion,  chronic bilateral hydronephrosis and bilateral nephrolithiasis, status post PCNL status post colostomy, hypertension, hyperlipidemia, GERD, CKD 3 , chronic sacral decubitus ulcers  Chronic Decubitus ulcers - in a patient with spina bifida and paraplegia Culture proteus  antibiotics are not going to help with the chronic osteomyelitis in a patient with immobility and bed bound status-need offloading, Dakins topical and then med honey as recommended by wound care consultant Will treat the superficial soft tissue infection with 2 weeks of Augmentin  Pt has colostomy and ileostomy   CKD? ? ___________________________________________________ Discussed with patient,and requesting provider Note:  This document was prepared using Dragon voice recognition software and may include unintentional dictation errors.

## 2023-05-16 NOTE — Consult Note (Signed)
Pharmacy Antibiotic Note  [name] is a 57 y.o. male with PMH significant for spina bifida admitted on 05/12/2023 for evaluation of increased drainage and size of known sacral ulcer. Patient follows with wound care clinic and was directed to the ED by the wound care physician when the wound was evaluated and noted to be substantially larger and have foul-smelling drainage Pharmacy has been consulted for Vancomycin and Zosyn dosing.  Today, 05/16/2023 Day #4 vancomycin and piperacillin/tazobactam Renal: SCr increased but stable (this looks to be his baseline per previous labs in care everywhere) WBC WNL Afebrile Wound culture: P mirabilis (susc pending) Blood culture NGTD  Plan: 1) Per discussion with ID - stop vancomycin based on gram negative in culture.   2) Continue Zosyn 3.375g IV q8h (4 hour infusion).for now with the possibility of narrowing  Will continue to monitor and de-escalate/adjust dosing as appropriate.  Height: 4\' 6"  (137.2 cm) Weight: 53.5 kg (118 lb) IBW/kg (Calculated) : 36.2  Temp (24hrs), Avg:98.1 F (36.7 C), Min:97.9 F (36.6 C), Max:98.3 F (36.8 C)  Recent Labs  Lab 05/12/23 1626 05/12/23 1843 05/13/23 0430 05/14/23 0850 05/16/23 0403  WBC 9.4  --  6.1 6.5  --   CREATININE 1.59*  --  1.53* 1.58* 1.42*  LATICACIDVEN 1.8 1.5  --   --   --     Estimated Creatinine Clearance: 35.8 mL/min (A) (by C-G formula based on SCr of 1.42 mg/dL (H)).    Allergies  Allergen Reactions   Morphine And Codeine Nausea And Vomiting   Sulfa Antibiotics Other (See Comments)    Reaction: unknown   Ivp Dye [Iodinated Contrast Media] Hives   Latex Rash    Antimicrobials this admission: Vancomycin 8/8 >> 8/12 Zosyn 8/8 >>   Dose adjustments this admission: N/A  Microbiology results: 8/8 BCx: ngtd 8/9 Wound cx: P mirabilis   Thank you for allowing pharmacy to be a part of this patient's care.  Juliette Alcide, PharmD, BCPS, BCIDP Work Cell:  [phone] 05/16/2023 2:58 PM

## 2023-05-17 DIAGNOSIS — L089 Local infection of the skin and subcutaneous tissue, unspecified: Secondary | ICD-10-CM | POA: Diagnosis not present

## 2023-05-17 DIAGNOSIS — T148XXA Other injury of unspecified body region, initial encounter: Secondary | ICD-10-CM | POA: Diagnosis not present

## 2023-05-17 LAB — POTASSIUM: Potassium: 4.9 mmol/L (ref 3.5–5.1)

## 2023-05-17 LAB — VITAMIN E
Vitamin E (Alpha Tocopherol): 8 mg/L (ref 7.0–25.1)
Vitamin E(Gamma Tocopherol): 0.9 mg/L (ref 0.5–5.5)

## 2023-05-17 LAB — COPPER, SERUM: Copper: 124 ug/dL (ref 69–132)

## 2023-05-17 LAB — VITAMIN A: Vitamin A (Retinoic Acid): 29.4 ug/dL (ref 20.1–62.0)

## 2023-05-17 MED ORDER — AMOXICILLIN-POT CLAVULANATE 875-125 MG PO TABS: 1.0000 | ORAL_TABLET | Freq: Two times a day (BID) | ORAL | 0 refills | Status: AC

## 2023-05-17 NOTE — Plan of Care (Signed)
?  Problem: Health Behavior/Discharge Planning: ?Goal: Ability to manage health-related needs will improve ?Outcome: Progressing ?  ?Problem: Nutrition: ?Goal: Adequate nutrition will be maintained ?Outcome: Progressing ?  ?Problem: Safety: ?Goal: Ability to remain free from injury will improve ?Outcome: Progressing ?  ?Problem: Skin Integrity: ?Goal: Risk for impaired skin integrity will decrease ?Outcome: Progressing ?  ?

## 2023-05-17 NOTE — TOC CM/SW Note (Addendum)
Transition of Care [facility]) - Inpatient Brief Assessment   Patient Details  Name: Ilhan Escue MRN: [id number] Date of Birth: 31-Mar-1966  [facility] CM/SW Contact:    Chapman Fitch, RN Phone Number: 05/17/2023, 12:36 PM   Clinical Narrative: Per MD no TOC needs Patient is bed bound at baseline. And to follow up at wound care center.  Per MD wound stable.   Per RN sister to transport today     Transition of Care Asessment: Insurance and Status: Insurance coverage has been reviewed Patient has primary care physician: Yes     Prior/Current Home Services: Current home services (Lives with sister who  provides his PCS services) Social Determinants of Health Reivew: SDOH reviewed no interventions necessary Readmission risk has been reviewed: Yes Transition of care needs: no transition of care needs at this time

## 2023-05-17 NOTE — Discharge Summary (Signed)
Physician Discharge Summary  Salil Bucher ION:[id number] DOB: March 28, 1966 DOA: 05/12/2023  PCP: Marguarite Arbour, MD  Admit date: 05/12/2023 Discharge date: 05/17/2023  Admitted From: Home Disposition:  Home  Recommendations for Outpatient Follow-up:  Follow up with PCP in 1-2 weeks Follow up with wound care center  Home Health:No  Equipment/Devices:None   Discharge Condition:Stable  CODE STATUS:FULL  Diet recommendation: Reg  Brief/Interim Summary:   57 y.o. male with medical history significant of spina bifida, neurogenic bladder, chronic bilateral hydronephrosis, ostomy, hypertension, hyperlipidemia, GERD, CKD stage IIIa who presents for evaluation of increased drainage and size of known sacral ulcer.  Patient follows with the wound care clinic and was directed to the ED by the wound care physician when the wound was evaluated and noted to have increased substantially in size and have foul-smelling drainage.   On arrival in ED patient is not overtly septic.  He does deny pain.  He has significant neuropathy and no feeling of the sacrum.  He is hemodynamically stable with no signs of overt sepsis or shock.  CT abdomen pelvis performed without contrast in the emergency department (patient has a contrast allergy) demonstrates acute on chronic sacral osteomyelitis.  Patient was started on broad-spectrum IV antibiotics vancomycin and Zosyn and hospitalist contacted for admission.  Seen in consultation by surgery and infectious disease.  Surgery evaluated wound.  No surgical debridement indicated.  Infectious disease does not recommend IV antibiotic therapy at this time.  Recommend Augmentin x 2 weeks.  Prescribed on discharge.  Stable for DC home at this time.  Will follow-up outpatient wound center.  Of note, patient was seen by palliative care during this admission.  No changes in CODE STATUS or goals of care.  Patient remains full scope at this time.   Discharge Diagnoses:  Principal  Problem:   Wound infection  Right ischial sacral ulcer Left ischial sacral ulcer Acute on chronic sacral osteomyelitis Difficult situation.  Patient clearly has signs of infection.  Sacral osteomyelitis is chronic and severe but may have worsened recently.  Patient is nonseptic/nontoxic-appearing.  Per general surgery no debridement warranted at this time.  Appreciate their recommendations Plan: Discharge home.  Discontinue IV antibiotics.  Augmentin 875/125 twice daily x 14 days.  Follow-up outpatient wound center.   Severe hypokalemia, recurrent Potassium 2.2, increased to 3.7 then back down to 2.9 No EKG changes plan: Potassium 4.9 on day of discharge   Spina bifida Chronic bedbound status WOC consult as above Palliative care consult Remains full scope of care Stable for discharge.  Outpatient wound care center follow-up   Discharge Instructions  Discharge Instructions     Diet - low sodium heart healthy   Complete by: As directed    Increase activity slowly   Complete by: As directed    No wound care   Complete by: As directed       Allergies as of 05/17/2023       Reactions   Morphine And Codeine Nausea And Vomiting   Sulfa Antibiotics Other (See Comments)   Reaction: unknown   Ivp Dye [iodinated Contrast Media] Hives   Latex Rash        Medication List     STOP taking these medications    ciprofloxacin 500 MG tablet Commonly known as: CIPRO       TAKE these medications    amoxicillin-clavulanate 875-125 MG tablet Commonly known as: AUGMENTIN Take 1 tablet by mouth 2 (two) times daily for 14 days.   AZO  CRANBERRY PO Take 1 tablet by mouth daily.   CENTRUM MEN PO Take 1 tablet by mouth daily.   clotrimazole-betamethasone cream Commonly known as: Lotrisone Apply 1 application. topically daily.   collagenase 250 UNIT/GM ointment Commonly known as: SANTYL Apply topically daily.   dicyclomine 20 MG tablet Commonly known as: BENTYL Take  20 mg by mouth 2 (two) times daily.   hydrochlorothiazide 25 MG tablet Commonly known as: HYDRODIURIL Take 25 mg by mouth daily.   pantoprazole 40 MG tablet Commonly known as: PROTONIX Take 1 tablet (40 mg total) by mouth daily.   potassium chloride SA 20 MEQ tablet Commonly known as: KLOR-CON M Take 20 mEq by mouth daily.   rosuvastatin 10 MG tablet Commonly known as: CRESTOR Take 10 mg by mouth at bedtime.   sucralfate 1 g tablet Commonly known as: CARAFATE Take 1 g by mouth 3 (three) times daily.   traZODone 50 MG tablet Commonly known as: DESYREL Take 50 mg by mouth at bedtime.        Allergies  Allergen Reactions   Morphine And Codeine Nausea And Vomiting   Sulfa Antibiotics Other (See Comments)    Reaction: unknown   Ivp Dye [Iodinated Contrast Media] Hives   Latex Rash    Consultations: ID General Surgery Palliative care   Procedures/Studies: CT PELVIS WO CONTRAST  Result Date: 05/12/2023 CLINICAL DATA:  Osteomyelitis suspected, pelvis, no prior imaging EXAM: CT PELVIS WITHOUT CONTRAST TECHNIQUE: Multidetector CT imaging of the pelvis was performed following the standard protocol without intravenous contrast. RADIATION DOSE REDUCTION: This exam was performed according to the departmental dose-optimization program which includes automated exposure control, adjustment of the mA and/or kV according to patient size and/or use of iterative reconstruction technique. COMPARISON:  CT 09/09/2021, MRI 11/01/2022 FINDINGS: Urinary Tract: Mild diffuse urinary bladder wall thickening, likely accentuated by under-distension. Bowel: No evidence of bowel obstruction or active bowel inflammation within the pelvis. Vascular/Lymphatic: No pathologically enlarged lymph nodes. No significant vascular abnormality seen. Reproductive:  Prostatomegaly. Other:  No free air or free fluid within the pelvis. Musculoskeletal: Large deep decubitus ulcer underlying the right ischium.  Progressive bone loss at the ischial tuberosity and inferior pubic ramus on the right with increased sclerosis of the adjacent bone compatible with acute on chronic osteomyelitis. There is adjacent skin thickening and mild stranding within the adjacent fat. No organized fluid collections. Right hip joint remains intact. Capsular thickening of the right hip joint without large effusion. Chronic left ischial decubitus ulcer with similar degree of sclerosis in the left ischial bone without definite progression of bone loss to suggest acute osteomyelitis at this location. Chronic left hip deformity. Chronic left hip capsular thickening with small joint effusion. No acute fractures. IMPRESSION: 1. Large deep decubitus ulcer underlying the right ischium with progressive bone loss at the ischial tuberosity and inferior pubic ramus on the right with increased sclerosis of the adjacent bone compatible with acute on chronic osteomyelitis. 2. Chronic left ischial decubitus ulcer with similar degree of sclerosis in the left ischial bone without definite progression of bone loss to suggest acute osteomyelitis at this location. 3. Chronic left hip deformity with small joint effusion. 4. Prostatomegaly with mild diffuse urinary bladder wall thickening, likely accentuated by under-distension. Correlate with urinalysis to exclude cystitis. Electronically Signed   By: Duanne Guess D.O.   On: 05/12/2023 16:02      Subjective: Seen and examined on the day of discharge.  Stable no distress.  Back to baseline.  Stable for discharge home.  Discharge Exam: Vitals:   05/17/23 0456 05/17/23 0740  BP: (!) 96/55 (!) 93/53  Pulse: 73 74  Resp: 20 18  Temp: 98 F (36.7 C) 98.1 F (36.7 C)  SpO2: 99% 100%   Vitals:   05/16/23 2008 05/16/23 2200 05/17/23 0456 05/17/23 0740  BP: 98/67  (!) 96/55 (!) 93/53  Pulse: 98  73 74  Resp: 16  20 18   Temp: 98.3 F (36.8 C)  98 F (36.7 C) 98.1 F (36.7 C)  TempSrc: Oral  Oral  Oral  SpO2: (!) 86% 98% 99% 100%  Weight:      Height:        General: Pt is alert, awake, not in acute distress Cardiovascular: RRR, S1/S2 +, no rubs, no gallops Respiratory: CTA bilaterally, no wheezing, no rhonchi Abdominal: Soft, NT, ND, bowel sounds + Extremities: no edema, no cyanosis    The results of significant diagnostics from this hospitalization (including imaging, microbiology, ancillary and laboratory) are listed below for reference.     Microbiology: Recent Results (from the past 240 hour(s))  Culture, blood (routine x 2)     Status: None   Collection Time: 05/12/23  3:32 PM   Specimen: BLOOD  Result Value Ref Range Status   Specimen Description BLOOD BLOOD LEFT FOREARM  Final   Special Requests   Final    BOTTLES DRAWN AEROBIC AND ANAEROBIC Blood Culture adequate volume   Culture   Final    NO GROWTH 5 DAYS Performed at [facility], [address] [place], [place] [address]    Report Status 05/17/2023 FINAL  Final  Culture, blood (routine x 2)     Status: None   Collection Time: 05/12/23  5:25 PM   Specimen: BLOOD  Result Value Ref Range Status   Specimen Description BLOOD BLOOD LEFT HAND  Final   Special Requests   Final    BOTTLES DRAWN AEROBIC AND ANAEROBIC Blood Culture adequate volume   Culture   Final    NO GROWTH 5 DAYS Performed at [facility], [address] [place], [place] [address]    Report Status 05/17/2023 FINAL  Final  Aerobic Culture w Gram Stain (superficial specimen)     Status: None (Preliminary result)   Collection Time: 05/13/23 11:35 AM   Specimen: Wound  Result Value Ref Range Status   Specimen Description   Final    WOUND Performed at [facility], [address] [place], [place] [address]    Special Requests   Final    NONE Performed at [facility], [address] [place], [place] [address]    Gram Stain   Final    RARE WBC PRESENT,BOTH PMN AND MONONUCLEAR RARE GRAM  POSITIVE COCCI    Culture   Final    FEW PROTEUS MIRABILIS CULTURE REINCUBATED FOR BETTER GROWTH Performed at [facility], [address] [place], [place] [address]    Report Status PENDING  Incomplete   Organism ID, Bacteria PROTEUS MIRABILIS  Final      Susceptibility   Proteus mirabilis - MIC*    AMPICILLIN 16.0 RESISTANT Resistant     CEFEPIME <=0.12 SENSITIVE Sensitive     CEFTAZIDIME <=1.0 SENSITIVE Sensitive     CEFTRIAXONE <=0.25 SENSITIVE Sensitive     CIPROFLOXACIN >=4.0 RESISTANT Resistant     GENTAMICIN <=1.0 SENSITIVE Sensitive     IMIPENEM 2.0 SENSITIVE Sensitive     TRIMETH/SULFA >=320.0 RESISTANT Resistant  AMPICILLIN/SULBACTAM <=2.0 SENSITIVE Sensitive     PIP/TAZO <=4.0 SENSITIVE Sensitive     * FEW PROTEUS MIRABILIS     Labs: BNP (last 3 results) No results for input(s): "BNP" in the last 8760 hours. Basic Metabolic Panel: Recent Labs  Lab 05/12/23 1626 05/12/23 1725 05/13/23 0430 05/14/23 0415 05/14/23 0850 05/16/23 0403 05/17/23 0546  NA 136  --  139  --  140 140  --   K 2.2*  --  3.0*  --  3.7 2.9* 4.9  CL 96*  --  106  --  110 109  --   CO2 26  --  24  --  21* 23  --   GLUCOSE 83  --  91  --  86 114*  --   BUN 31*  --  25*  --  20 14  --   CREATININE 1.59*  --  1.53*  --  1.58* 1.42*  --   CALCIUM 9.3  --  8.3*  --  8.2* 8.5*  --   MG  --  2.6*  --   --   --   --   --   PHOS  --   --   --  2.4*  --   --   --    Liver Function Tests: Recent Labs  Lab 05/12/23 1626 05/13/23 0430  AST 25 17  ALT 20 16  ALKPHOS 77 56  BILITOT 0.4 0.8  PROT 9.1* 6.4*  ALBUMIN 3.3* 2.3*   No results for input(s): "LIPASE", "AMYLASE" in the last 168 hours. No results for input(s): "AMMONIA" in the last 168 hours. CBC: Recent Labs  Lab 05/12/23 1626 05/13/23 0430 05/14/23 0850  WBC 9.4 6.1 6.5  NEUTROABS 6.9  --  4.3  HGB 12.8* 10.1* 10.3*  HCT 42.5 32.5* 35.2*  MCV 78.6* 76.3* 79.6*  PLT 599* 481* 485*   Cardiac Enzymes: No results  for input(s): "CKTOTAL", "CKMB", "CKMBINDEX", "TROPONINI" in the last 168 hours. BNP: Invalid input(s): "POCBNP" CBG: Recent Labs  Lab 05/16/23 0838  GLUCAP 120*   D-Dimer No results for input(s): "DDIMER" in the last 72 hours. Hgb A1c No results for input(s): "HGBA1C" in the last 72 hours. Lipid Profile No results for input(s): "CHOL", "HDL", "LDLCALC", "TRIG", "CHOLHDL", "LDLDIRECT" in the last 72 hours. Thyroid function studies No results for input(s): "TSH", "T4TOTAL", "T3FREE", "THYROIDAB" in the last 72 hours.  Invalid input(s): "FREET3" Anemia work up No results for input(s): "VITAMINB12", "FOLATE", "FERRITIN", "TIBC", "IRON", "RETICCTPCT" in the last 72 hours. Urinalysis    Component Value Date/Time   COLORURINE YELLOW (A) 09/19/2021 1839   APPEARANCEUR CLOUDY (A) 09/19/2021 1839   APPEARANCEUR Cloudy (A) 03/21/2019 1130   LABSPEC 1.010 09/19/2021 1839   PHURINE 9.0 (H) 09/19/2021 1839   GLUCOSEU NEGATIVE 09/19/2021 1839   HGBUR SMALL (A) 09/19/2021 1839   BILIRUBINUR NEGATIVE 09/19/2021 1839   BILIRUBINUR Negative 03/21/2019 1130   KETONESUR NEGATIVE 09/19/2021 1839   PROTEINUR 100 (A) 09/19/2021 1839   NITRITE NEGATIVE 09/19/2021 1839   LEUKOCYTESUR LARGE (A) 09/19/2021 1839   Sepsis Labs Recent Labs  Lab 05/12/23 1626 05/13/23 0430 05/14/23 0850  WBC 9.4 6.1 6.5   Microbiology Recent Results (from the past 240 hour(s))  Culture, blood (routine x 2)     Status: None   Collection Time: 05/12/23  3:32 PM   Specimen: BLOOD  Result Value Ref Range Status   Specimen Description BLOOD BLOOD LEFT FOREARM  Final   Special Requests  Final    BOTTLES DRAWN AEROBIC AND ANAEROBIC Blood Culture adequate volume   Culture   Final    NO GROWTH 5 DAYS Performed at [facility], [address] [place], [place] [address]    Report Status 05/17/2023 FINAL  Final  Culture, blood (routine x 2)     Status: None   Collection Time: 05/12/23  5:25 PM    Specimen: BLOOD  Result Value Ref Range Status   Specimen Description BLOOD BLOOD LEFT HAND  Final   Special Requests   Final    BOTTLES DRAWN AEROBIC AND ANAEROBIC Blood Culture adequate volume   Culture   Final    NO GROWTH 5 DAYS Performed at [facility], [address], [place], [place] [address]    Report Status 05/17/2023 FINAL  Final  Aerobic Culture w Gram Stain (superficial specimen)     Status: None (Preliminary result)   Collection Time: 05/13/23 11:35 AM   Specimen: Wound  Result Value Ref Range Status   Specimen Description   Final    WOUND Performed at [facility], [address], [place], [place] [address]    Special Requests   Final    NONE Performed at [facility], [address] [place], [place] [address]    Gram Stain   Final    RARE WBC PRESENT,BOTH PMN AND MONONUCLEAR RARE GRAM POSITIVE COCCI    Culture   Final    FEW PROTEUS MIRABILIS CULTURE REINCUBATED FOR BETTER GROWTH Performed at [facility], [address], [place], [place] [address]    Report Status PENDING  Incomplete   Organism ID, Bacteria PROTEUS MIRABILIS  Final      Susceptibility   Proteus mirabilis - MIC*    AMPICILLIN 16.0 RESISTANT Resistant     CEFEPIME <=0.12 SENSITIVE Sensitive     CEFTAZIDIME <=1.0 SENSITIVE Sensitive     CEFTRIAXONE <=0.25 SENSITIVE Sensitive     CIPROFLOXACIN >=4.0 RESISTANT Resistant     GENTAMICIN <=1.0 SENSITIVE Sensitive     IMIPENEM 2.0 SENSITIVE Sensitive     TRIMETH/SULFA >=320.0 RESISTANT Resistant     AMPICILLIN/SULBACTAM <=2.0 SENSITIVE Sensitive     PIP/TAZO <=4.0 SENSITIVE Sensitive     * FEW PROTEUS MIRABILIS     Time coordinating discharge: Over 30 minutes  SIGNED:   Tresa Moore, MD  [facility] 05/17/2023, 12:26 PM Pager   If 7PM-7AM, please contact night-coverage

## 2023-05-19 ENCOUNTER — Encounter: Payer: 59 | Admitting: Physician Assistant

## 2023-05-19 DIAGNOSIS — M1990 Unspecified osteoarthritis, unspecified site: Secondary | ICD-10-CM | POA: Diagnosis not present

## 2023-05-19 DIAGNOSIS — G8220 Paraplegia, unspecified: Secondary | ICD-10-CM | POA: Diagnosis not present

## 2023-05-19 DIAGNOSIS — Z933 Colostomy status: Secondary | ICD-10-CM | POA: Diagnosis not present

## 2023-05-19 DIAGNOSIS — Q760 Spina bifida occulta: Secondary | ICD-10-CM | POA: Diagnosis not present

## 2023-05-19 DIAGNOSIS — I2510 Atherosclerotic heart disease of native coronary artery without angina pectoris: Secondary | ICD-10-CM | POA: Diagnosis not present

## 2023-05-19 DIAGNOSIS — N183 Chronic kidney disease, stage 3 unspecified: Secondary | ICD-10-CM | POA: Diagnosis not present

## 2023-05-19 DIAGNOSIS — N319 Neuromuscular dysfunction of bladder, unspecified: Secondary | ICD-10-CM | POA: Diagnosis not present

## 2023-05-19 DIAGNOSIS — L89314 Pressure ulcer of right buttock, stage 4: Secondary | ICD-10-CM | POA: Diagnosis present

## 2023-05-19 DIAGNOSIS — Z982 Presence of cerebrospinal fluid drainage device: Secondary | ICD-10-CM | POA: Diagnosis not present

## 2023-05-19 DIAGNOSIS — I129 Hypertensive chronic kidney disease with stage 1 through stage 4 chronic kidney disease, or unspecified chronic kidney disease: Secondary | ICD-10-CM | POA: Diagnosis not present

## 2023-05-19 DIAGNOSIS — Z87442 Personal history of urinary calculi: Secondary | ICD-10-CM | POA: Diagnosis not present

## 2023-05-19 DIAGNOSIS — H409 Unspecified glaucoma: Secondary | ICD-10-CM | POA: Diagnosis not present

## 2023-05-19 NOTE — Progress Notes (Addendum)
[name] ([id number]) [id number] Page 1 of 10 Visit Report for 05/19/2023 Arrival Information Details Patient Name: Date of Service: [name] [facility], DO [place] LD 05/19/2023 1:30 PM Medical Record Number: [id number] Patient Account Number: [id number] Date of Birth/Sex: Treating RN: 03/09/1966 (57 y.o. [name]) [name] Primary Care Chace Bisch: Aram Beecham Other Clinician: Referring Sylvia Helms: Treating Ashlee Player/Extender: Gabriel Earing in Treatment: 7 Visit Information History Since Last Visit Added or deleted any medications: No Patient Arrived: Wheel Chair Any new allergies or adverse reactions: No Arrival Time: 13:39 Had a fall or experienced change in No Accompanied By: self activities of daily living that may affect Transfer Assistance: Transfer Board risk of falls: Patient Identification Verified: Yes Signs or symptoms of abuse/neglect since last visito No Secondary Verification Process Completed: Yes Hospitalized since last visit: No Patient Requires Transmission-Based Precautions: No Implantable device outside of the clinic excluding No Patient Has Alerts: No cellular tissue based products placed in the center since last visit: Has Dressing in Place as Prescribed: Yes Pain Present Now: No Electronic Signature(s) Signed: 05/19/2023 1:40:10 PM By: [name] RN Entered By: [name] on 05/19/2023 13:40:10 -------------------------------------------------------------------------------- Clinic Level of Care Assessment Details Patient Name: Date of ServiceEulas [name] [facility], [name] 05/19/2023 1:30 PM Medical Record Number: [id number] Patient Account Number: [id number] Date of Birth/Sex: Treating RN: [birth year] (57 y.o. [name]) [name] Primary Care Kelie Gainey: Aram Beecham Other Clinician: Referring Refugio Vandevoorde: Treating Shanyce Daris/Extender: Gabriel Earing in Treatment: 7 Clinic Level of Care Assessment  Items TOOL 4 Quantity Score X- 1 0 Use when only an EandM is performed on FOLLOW-UP visit ASSESSMENTS - Nursing Assessment / Reassessment X- 1 10 Reassessment of Co-morbidities (includes updates in patient status) X- 1 5 Reassessment of Adherence to Treatment Plan KALI, DANG ([id number]) [id number] Page 2 of 10 ASSESSMENTS - Wound and Skin A ssessment / Reassessment X - Simple Wound Assessment / Reassessment - one wound 1 5 []  - 0 Complex Wound Assessment / Reassessment - multiple wounds []  - 0 Dermatologic / Skin Assessment (not related to wound area) ASSESSMENTS - Focused Assessment []  - 0 Circumferential Edema Measurements - multi extremities []  - 0 Nutritional Assessment / Counseling / Intervention []  - 0 Lower Extremity Assessment (monofilament, tuning fork, pulses) []  - 0 Peripheral Arterial Disease Assessment (using hand held doppler) ASSESSMENTS - Ostomy and/or Continence Assessment and Care []  - 0 Incontinence Assessment and Management []  - 0 Ostomy Care Assessment and Management (repouching, etc.) PROCESS - Coordination of Care X - Simple Patient / Family Education for ongoing care 1 15 []  - 0 Complex (extensive) Patient / Family Education for ongoing care []  - 0 Staff obtains Chiropractor, Records, T Results / Process Orders est []  - 0 Staff telephones HHA, Nursing Homes / Clarify orders / etc []  - 0 Routine Transfer to another Facility (non-emergent condition) []  - 0 Routine Hospital Admission (non-emergent condition) []  - 0 New Admissions / Manufacturing engineer / Ordering NPWT Apligraf, etc. , []  - 0 Emergency Hospital Admission (emergent condition) X- 1 10 Simple Discharge Coordination []  - 0 Complex (extensive) Discharge Coordination PROCESS - Special Needs []  - 0 Pediatric / Minor Patient Management []  - 0 Isolation Patient Management []  - 0 Hearing / Language / Visual special needs []  - 0 Assessment of  Community assistance (transportation, D/C planning, etc.) []  - 0 Additional assistance / Altered mentation []  - 0 Support Surface(s) Assessment (bed, cushion, seat, etc.) INTERVENTIONS - Wound Cleansing / Measurement X -  Simple Wound Cleansing - one wound 1 5 []  - 0 Complex Wound Cleansing - multiple wounds X- 1 5 Wound Imaging (photographs - any number of wounds) []  - 0 Wound Tracing (instead of photographs) X- 1 5 Simple Wound Measurement - one wound []  - 0 Complex Wound Measurement - multiple wounds INTERVENTIONS - Wound Dressings X - Small Wound Dressing one or multiple wounds 1 10 []  - 0 Medium Wound Dressing one or multiple wounds []  - 0 Large Wound Dressing one or multiple wounds []  - 0 Application of Medications - topical []  - 0 Application of Medications - injection INTERVENTIONS - Miscellaneous []  - 0 External ear exam [name] ([id number]) [id number] Page 3 of 10 []  - 0 Specimen Collection (cultures, biopsies, blood, body fluids, etc.) []  - 0 Specimen(s) / Culture(s) sent or taken to Lab for analysis []  - 0 Patient Transfer (multiple staff / Michiel Sites Lift / Similar devices) []  - 0 Simple Staple / Suture removal (25 or less) []  - 0 Complex Staple / Suture removal (26 or more) []  - 0 Hypo / Hyperglycemic Management (close monitor of Blood Glucose) []  - 0 Ankle / Brachial Index (ABI) - do not check if billed separately X- 1 5 Vital Signs Has the patient been seen at the hospital within the last three years: Yes Total Score: 75 Level Of Care: New/Established - Level 2 Electronic Signature(s) Signed: 06/03/2023 11:59:03 AM By: [name] RN Entered By: [name] on 05/19/2023 14:29:05 -------------------------------------------------------------------------------- Encounter Discharge Information Details Patient Name: Date of Service: [name] [facility], [name] 05/19/2023 1:30 PM Medical Record Number: [id number] Patient  Account Number: [id number] Date of Birth/Sex: Treating RN: [birth year] (57 y.o. [name] Primary Care Kolby Myung: Aram Beecham Other Clinician: Referring Nirvaan Frett: Treating Reis Goga/Extender: Gabriel Earing in Treatment: 7 Encounter Discharge Information Items Discharge Condition: Stable Ambulatory Status: Wheelchair Discharge Destination: Home Transportation: Private Auto Accompanied By: self Schedule Follow-up Appointment: Yes Clinical Summary of Care: Electronic Signature(s) Signed: 05/19/2023 2:30:00 PM By: [name] RN Entered By: [name] on 05/19/2023 14:29:59 -------------------------------------------------------------------------------- Lower Extremity Assessment Details Patient Name: Date of ServiceEulas [name] [facility], [name] 05/19/2023 1:30 PM [name] ([id number]) [id number] Page 4 of 10 Medical Record Number: [id number] Patient Account Number: [id number] Date of Birth/Sex: Treating RN: 02-Oct-1966 (57 y.o. [name]) [name] Primary Care Iyan Flett: Aram Beecham Other Clinician: Referring Oliwia Berzins: Treating Annie Roseboom/Extender: Hermine Messick Weeks in Treatment: 7 Electronic Signature(s) Signed: 05/19/2023 1:44:27 PM By: [name] RN Entered By: [name] on 05/19/2023 13:44:27 -------------------------------------------------------------------------------- Multi Wound Chart Details Patient Name: Date of Service: [name] TH, [name] 05/19/2023 1:30 PM Medical Record Number: [id number] Patient Account Number: [id number] Date of Birth/Sex: Treating RN: [birth year] (57 y.o. [name]) [name] Primary Care Levonia Wolfley: Aram Beecham Other Clinician: Referring Monicia Tse: Treating Shakima Nisley/Extender: Hermine Messick Weeks in Treatment: 7 Vital Signs Height(in): 59 Pulse(bpm): 111 Weight(lbs): 90 Blood Pressure(mmHg): 114/78 Body Mass Index(BMI): 18.2 Temperature(F):  97.5 Respiratory Rate(breaths/min): 18 [5:Photos:] [N/A:N/A] Right, Midline Gluteus Right, Lateral Gluteus N/A Wound Location: Gradually Appeared Gradually Appeared N/A Wounding Event: Pressure Ulcer Pressure Ulcer N/A Primary Etiology: Cataracts, Glaucoma, Coronary Artery Cataracts, Glaucoma, Coronary Artery N/A Comorbid History: Disease, Hypertension, History of Disease, Hypertension, History of pressure wounds, Osteoarthritis, pressure wounds, Osteoarthritis, Paraplegia Paraplegia 10/04/2021 10/04/2021 N/A Date Acquired: 7 7 N/A Weeks of Treatment: Open Open N/A Wound Status: No No N/A Wound Recurrence: 6.3x4.5x3.2 3x3.7x2 N/A Measurements L x W x D (cm) 22.266  8.718 N/A A (cm) : rea 71.251 17.436 N/A Volume (cm) : -126.80% -335.20% N/A % Reduction in A rea: -383.80% -4248.10% N/A % Reduction in Volume: Category/Stage IV Category/Stage IV N/A Classification: Large Medium N/A Exudate A mount: Serosanguineous Serosanguineous N/A Exudate Type: red, brown red, brown N/A Exudate Color: Medium (34-66%) Small (1-33%) N/A Granulation A mount: Red Red N/A Granulation Quality: Medium (34-66%) Large (67-100%) N/A Necrotic A mount: Fat Layer (Subcutaneous Tissue): Yes Fat Layer (Subcutaneous Tissue): Yes N/A Exposed Structures: Muscle: Yes Fascia: No Bone: Yes Tendon: No [name] ([id number]) [id number] Page 5 of 10 Fascia: No Muscle: No Tendon: No Joint: No Joint: No Bone: No None None N/A Epithelialization: Treatment Notes Electronic Signature(s) Signed: 05/19/2023 1:44:31 PM By: [name] RN Entered By: [name] on 05/19/2023 13:44:31 -------------------------------------------------------------------------------- Multi-Disciplinary Care Plan Details Patient Name: Date of Service: [name] TH, [name] 05/19/2023 1:30 PM Medical Record Number: [id number] Patient Account Number: [id number] Date of Birth/Sex: Treating  RN: 1965-12-12 (56 y.o. [name] Primary Care Chrishon Martino: Aram Beecham Other Clinician: Referring Sada Mazzoni: Treating Savior Himebaugh/Extender: Hermine Messick Weeks in Treatment: 7 Active Inactive Pressure Nursing Diagnoses: Potential for impaired tissue integrity related to pressure, friction, moisture, and shear Goals: Patient will remain free from development of additional pressure ulcers Date Initiated: 03/31/2023 Target Resolution Date: 07/01/2023 Goal Status: Active Interventions: Assess: immobility, friction, shearing, incontinence upon admission and as needed Assess offloading mechanisms upon admission and as needed Assess potential for pressure ulcer upon admission and as needed Notes: Wound/Skin Impairment Nursing Diagnoses: Knowledge deficit related to ulceration/compromised skin integrity Goals: Patient/caregiver will verbalize understanding of skin care regimen Date Initiated: 03/31/2023 Target Resolution Date: 07/01/2023 Goal Status: Active Ulcer/skin breakdown will have a volume reduction of 30% by week 4 Date Initiated: 03/31/2023 Target Resolution Date: 05/31/2023 Goal Status: Active Ulcer/skin breakdown will have a volume reduction of 50% by week 8 Date Initiated: 03/31/2023 Target Resolution Date: 07/01/2023 Goal Status: Active Ulcer/skin breakdown will have a volume reduction of 80% by week 12 Date Initiated: 03/31/2023 Target Resolution Date: 07/31/2023 Goal Status: Active Ulcer/skin breakdown will heal within 14 weeks Date Initiated: 03/31/2023 Target Resolution Date: 08/31/2023 PHILLIP, TOOPS ([id number]) [id number] Page 6 of 10 Goal Status: Active Interventions: Assess patient/caregiver ability to obtain necessary supplies Assess patient/caregiver ability to perform ulcer/skin care regimen upon admission and as needed Assess ulceration(s) every visit Notes: Electronic Signature(s) Signed: 05/19/2023 1:44:43 PM  By: [name] RN Entered By: [name] on 05/19/2023 13:44:43 -------------------------------------------------------------------------------- Pain Assessment Details Patient Name: Date of ServiceEulas [name] [facility], [name] 05/19/2023 1:30 PM Medical Record Number: [id number] Patient Account Number: [id number] Date of Birth/Sex: Treating RN: 1965/10/28 (56 y.o. [name] Primary Care Ellysa Parrack: Aram Beecham Other Clinician: Referring Iktan Aikman: Treating Lindsee Labarre/Extender: Hermine Messick Weeks in Treatment: 7 Active Problems Location of Pain Severity and Description of Pain Patient Has Paino No Site Locations Pain Management and Medication Current Pain Management: Electronic Signature(s) Signed: 05/19/2023 1:41:06 PM By: [name] RN Entered By: [name] on 05/19/2023 13:41:05 [name] ([id number]) [id number] Page 7 of 10 -------------------------------------------------------------------------------- Patient/Caregiver Education Details Patient Name: Date of Service: [name] [facility], DO [place] LD 8/15/2024andnbsp1:30 PM Medical Record Number: [id number] Patient Account Number: [id number] Date of Birth/Gender: Treating RN: 25-Apr-1966 (56 y.o. [name] Primary Care Physician: Aram Beecham Other Clinician: Referring Physician: Treating Physician/Extender: Gabriel Earing in Treatment: 7 Education Assessment Education Provided To: Patient Education Topics Provided Pressure: Handouts: Pressure Injury: Prevention  and Offloading Methods: Explain/Verbal Responses: State content correctly Electronic Signature(s) Signed: 06/03/2023 11:59:03 AM By: [name] RN Entered By: [name] on 05/19/2023 13:45:01 -------------------------------------------------------------------------------- Wound Assessment Details Patient Name: Date of ServiceEulas [name] [facility], [name] 05/19/2023 1:30 PM Medical Record  Number: [id number] Patient Account Number: [id number] Date of Birth/Sex: Treating RN: 29-Jan-1966 (57 y.o. [name]) [name] Primary Care Katalyna Socarras: Aram Beecham Other Clinician: Referring Yocelyn Brocious: Treating Shantara Goosby/Extender: Hermine Messick Weeks in Treatment: 7 Wound Status Wound Number: 5 Primary Pressure Ulcer Etiology: Wound Location: Right, Midline Gluteus Wound Open Wounding Event: Gradually Appeared Status: Date Acquired: 10/04/2021 Comorbid Cataracts, Glaucoma, Coronary Artery Disease, Hypertension, Weeks Of Treatment: 7 History: History of pressure wounds, Osteoarthritis, Paraplegia Clustered Wound: No Photos [place], Dorinda [name] ([id number]) [id number] Page 8 of 10 Wound Measurements Length: (cm) 6.3 Width: (cm) 4.5 Depth: (cm) 3.2 Area: (cm) 22.266 Volume: (cm) 71.251 % Reduction in Area: -126.8% % Reduction in Volume: -383.8% Epithelialization: None Tunneling: No Undermining: No Wound Description Classification: Category/Stage IV Exudate Amount: Large Exudate Type: Serosanguineous Exudate Color: red, brown Foul Odor After Cleansing: No Slough/Fibrino Yes Wound Bed Granulation Amount: Medium (34-66%) Exposed Structure Granulation Quality: Red Fascia Exposed: No Necrotic Amount: Medium (34-66%) Fat Layer (Subcutaneous Tissue) Exposed: Yes Tendon Exposed: No Muscle Exposed: Yes Necrosis of Muscle: No Joint Exposed: No Bone Exposed: Yes Electronic Signature(s) Signed: 05/19/2023 1:43:57 PM By: [name] RN Entered By: [name] on 05/19/2023 13:43:57 -------------------------------------------------------------------------------- Wound Assessment Details Patient Name: Date of ServiceEulas [name] [facility], [name] 05/19/2023 1:30 PM Medical Record Number: [id number] Patient Account Number: [id number] Date of Birth/Sex: Treating RN: Dec 16, 1965 (57 y.o. [name]) [name] Primary Care Aryannah Mohon: Aram Beecham Other  Clinician: Referring Avelynn Sellin: Treating Ladarrion Telfair/Extender: Hermine Messick Weeks in Treatment: 7 Wound Status Wound Number: 6 Primary Pressure Ulcer Etiology: Wound Location: Right, Lateral Gluteus Wound Open Wounding Event: Gradually Appeared Status: Date Acquired: 10/04/2021 Comorbid Cataracts, Glaucoma, Coronary Artery Disease, Hypertension, Weeks Of Treatment: 7 History: History of pressure wounds, Osteoarthritis, Paraplegia Clustered Wound: No Photos [place], Dorinda [name] ([id number]) [id number] Page 9 of 10 Wound Measurements Length: (cm) 3 Width: (cm) 3.7 Depth: (cm) 2 Area: (cm) 8.718 Volume: (cm) 17.436 % Reduction in Area: -335.2% % Reduction in Volume: -4248.1% Epithelialization: None Tunneling: No Undermining: No Wound Description Classification: Category/Stage IV Exudate Amount: Medium Exudate Type: Serosanguineous Exudate Color: red, brown Foul Odor After Cleansing: No Slough/Fibrino Yes Wound Bed Granulation Amount: Small (1-33%) Exposed Structure Granulation Quality: Red Fascia Exposed: No Necrotic Amount: Large (67-100%) Fat Layer (Subcutaneous Tissue) Exposed: Yes Necrotic Quality: Adherent Slough Tendon Exposed: No Muscle Exposed: No Joint Exposed: No Bone Exposed: No Electronic Signature(s) Signed: 05/19/2023 1:44:17 PM By: [name] RN Entered By: [name] on 05/19/2023 13:44:16 -------------------------------------------------------------------------------- Vitals Details Patient Name: Date of Service: [name] TH, [name] 05/19/2023 1:30 PM Medical Record Number: [id number] Patient Account Number: [id number] Date of Birth/Sex: Treating RN: [birth year] (57 y.o. [name]) [name] Primary Care Genoveva Singleton: Aram Beecham Other Clinician: Referring Shashank Kwasnik: Treating Kristol Almanzar/Extender: Gabriel Earing in Treatment: 7 Vital Signs Time Taken: 13:35 Temperature (F): 97.5 Height (in):  59 Pulse (bpm): 111 Weight (lbs): 90 Respiratory Rate (breaths/min): 18 Body Mass Index (BMI): 18.2 Blood Pressure (mmHg): 114/78 Reference Range: 80 - 120 mg / dl Electronic Signature(s) Signed: 05/19/2023 1:40:59 PM By: [name] RN Entered By: [name] on 05/19/2023 13:40:59 [name] ([id number]) [id number] Page 10 of 10

## 2023-05-19 NOTE — Progress Notes (Addendum)
[place], [name] ([id number]) [id number] Page 1 of 10 Visit Report for 05/19/2023 Chief Complaint Document Details Patient Name: Date of Service: [name] [facility], [name] 05/19/2023 1:30 PM Medical Record Number: [id number] Patient Account Number: [id number] Date of Birth/Sex: Treating RN: 11-26-1965 (56 y.o. [name]) [name] Primary Care Provider: Aram Beecham Other Clinician: Referring Provider: Treating Provider/Extender: Gabriel Earing in Treatment: 7 Information Obtained from: Patient Chief Complaint Right ischial tuberosity pressure ulcer with chronic osteomyelitis Electronic Signature(s) Signed: 05/19/2023 2:23:06 PM By: Allen Derry PA-C Previous Signature: 05/19/2023 1:46:46 PM Version By: Allen Derry PA-C Entered By: Allen Derry on 05/19/2023 14:23:06 -------------------------------------------------------------------------------- HPI Details Patient Name: Date of Service: [name] [facility], [name] 05/19/2023 1:30 PM Medical Record Number: [id number] Patient Account Number: [id number] Date of Birth/Sex: Treating RN: 1966-04-13 (56 y.o. [name] Primary Care Provider: Aram Beecham Other Clinician: Referring Provider: Treating Provider/Extender: Gabriel Earing in Treatment: 7 History of Present Illness HPI Description: 57 year old male with a history of spina bifida presenting to [place] with a history of a open wound on the left gluteal region near his upper thigh which she's had for several weeks. He was seen in the ED recently at [facility] and was put on doxycycline and was advised some local care. his past medical history is significant for spinal bifid, neurogenic bladder, kidney stones, sacral pressure sores, constipation. Past surgical history significant for partial cystectomy, ileal conduit, VP shunt removal, percutaneous nephro lithotripsy. In the remote past the patient says he's had some  treatment for a ulcer on this area and was treated with a skin graft. Readmission: 10/16/2021 upon evaluation today patient appears to be doing somewhat poorly in regard to a fairly large wound noted over the right ischial tuberosity location. This is a stage IV pressure ulcer. Of note this is also positive for osteomyelitis upon a CT scan which was performed during the time that he was admitted in the hospital on 09/19/2021. With that being said it is noted in the right inferior buttock that he has a decubitus ulcer which extends to the posterior toe inferior right ischium where there is evidence of osteomyelitis. When he was here in the office actually missed that this was positive I missed read it and thought it said that there was no osteomyelitis. That is not the case there is in fact osteomyelitis noted here. I actually did review his notes as well in epic. Looking to the discharge summary it appears that the patient was admitted from 09/19/2021 through 09/23/2021. He was discharged home with home health care according to the note. With that being said from what I understand his sister actually takes care of him at this [facility], [name] ([id number]) [id number] Page 2 of 10 point. He was also to follow-up with a primary care provider and here at the wound care center within a week. I am just now seeing him on the 13th almost a month after discharge. He does have a history of again osteomyelitis of this right hip/ischial tuberosity location. 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 The reason for his admission was actually worsening of the decubitus ulcer upon admission. He does have chronic osteomyelitis of this area. He was treated with empiric antibiotics he had acute kidney injury which improved with IV fluids he  also  had hypokalemia. Surgical debridement was performed by general surgery at that time and ID was consulted to assist with management according to the notes. IV antibiotics were recommended but patient declined and wanted oral antibiotics at discharge. Therefore no IV antibiotics were planned for discharge time. This case was under the care of Dr. Joylene Draft while he was in the hospital when she did discharge him with a 2-week course of doxycycline and Augmentin according to the notes. It looks like Dr. Judithann Sheen office did call and leave a message for the patient to get scheduled for a follow-up visit after hospital discharge I do not see where he showed up in fact it appears that the patient has a no-show letter from Dr. Judithann Sheen office noted in epic due to a appointment that was missed on 10/06/2021. Looking at the pictures from Dr. Cristine Polio notes on 09/23/2021 it does appear that the wound is a little bit better as far as the cleanliness of the surface of the wound today but nonetheless still is a very significant wound. 10/26/2021 upon evaluation today patient appears to be doing okay in regard to his wound. Again this is a wound that he did indeed have osteomyelitis and previous. With that being said I do not see any signs of a worsening infection at this time which is good news. Overall I think that we are headed in the right direction although still I think he needs more aggressive offloading. 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 I do believe this would be ideal for him to be honest. Readmission: 01-15-2022 upon evaluation today patient presents for reevaluation he was last seen on October 26, 2021. At that time unfortunately he was having issues with a significant ischial pressure ulcer on the right ischial tuberosity. Subsequently he ended up having decent response to the Dakin's moistened gauze dressing that was previously being utilized and subsequently the patient's  sister who is his primary caregiver as well states that she decided just to try to manage this at home. Nonetheless unfortunately this is getting a little larger not better. He does spend a lot of the day in his chair. He does have an air mattress according what they tell me today. That was previously ordered. With that being said I do believe that at this time things do appear to be doing much better from the standpoint of infection I do not see any signs of overt infection. There is also no evidence of systemic infection. No fevers, chills, nausea, vomiting, or diarrhea. With that being said I do believe based on what I am seeing the wound is a bit cleaner and he possibly could be an excellent candidate for a wound VAC. 02-05-2022 upon evaluation today patient appears to be doing about the same in regard to his wound. Unfortunately he is not any better but he also really has not had the wound VAC on sufficiently. There is been some confusion with the orders part of that I think is our follow-up with the way the order was written part of it may be with how the wound VAC is being placed either way we need to see what we can do to try to improve things in this regard. We will clarify the orders today going forward for home health. Unfortunately the patient does have a new wound today which is on the opposite side. This is not good 1 was bad enough have another area to open is definitely  not a good thing. Its not as deep but nonetheless is also not very shallow either. We may potentially be able to get this to the point that we could bridge the 2 together in order to wound VAC both but right now want a focus on to try to get the wound VAC going for the main wound initially we will likely use Hydrofera Blue on the new wound.Marland Kitchen 02-19-2022 upon evaluation today patient's wound appears to be doing a little bit worse in the way of maceration fortunately there is no signs of active infection locally or  systemically which is great news. No fevers, chills, nausea, vomiting, or diarrhea. 03-05-2022 upon evaluation today patient unfortunately does not appear to be doing nearly as good in regard to his wounds as I would like to see. Fortunately I do not see any evidence of active infection that is obvious although I am can obtain a wound culture to ensure that there is not anything getting worse here as far as the wound without the wound VAC on his concern. With that being said I will go ahead and get this sent in and evaluated will initiate treatment as needed going forward if necessary. With regard to the wound with the wound VAC this continues to be placed directly over the wound and there is obvious signs of deep tissue injury. The suction being here means that he is sitting on it I think this may be part of the reason why it is coming off although not 100% certain. 03-12-2022 upon evaluation today patient still has not gotten the antibiotics that were called in for him 4 days ago. He tells me that his sister just told him this morning that they were ready but that she has not had a chance to go pick them up. Nonetheless I feel like that this is really something he needs any should have been taken that not the other antibiotics which were not doing the job for him to be honest. The patient states and I completely agree that there is really no way he would have known relying on his sister to let him know obviously. Nonetheless I do believe that he needs to not be taking any other antibiotics and be on the Levaquin as soon as possible. He tells me that she said she was going to pick them up today. In regard to home health I am hopeful that they actually have been bridging this now. I do think that the wound looks better and this is good news. With that being said I am not certain that I can really tell for sure how this was attached it was removed before he came in and the black foam left in place. I really  want him to leave the wound VAC in place until he comes in so that we can monitor and make sure that this is being done correctly he voiced understanding. 03-19-2022 upon evaluation today and much happier with how things appear as far as the patient's wounds are concerned. I think he is on a much better track and they are doing a better job at bridging although its not quite where I like to see it is still more posterior which I think the patient needs to be a little bit more lateral on his side. Either way this is something that I did marked with a small dot today for him to tell the home health nurses well so she can bridge it to that location also think the  tubing should go up instead of going down to just make it less cumbersome overall for him. 04-19-2022 upon evaluation today patient appears to be doing a little better in regard to his wounds. Fortunately I do not see any signs of infection I do believe he is on the right track. The wound VAC does seem to be helping to clean the wound up a bit and bring it in from the base at work. This is good news. 05-10-2022 upon evaluation today patient appears to be doing well currently in regard to his wounds in general in fact of the smaller of the 2 wounds without the wound VAC actually appears to be doing so much better this is almost completely closed and very pleased in that regard. He has been staying off of this he tells me and shows. With that being said the wound with the wound VAC is going require some sharp debridement there is some tissue which is not quite as healthy and we are going to go ahead and continue that for probably 2 more weeks although I think we may be closer to discontinuing this as well since it has healed and quite significantly. 05-24-2022 upon evaluation today patient appears to be doing well with regard to his wound in fact this looks better than I have been expected. This actually I think is a good time to go ahead and switch  away from the wound VAC based on what I am seeing. I think we go to [facility] with a bordered foam dressing. 06-21-2022 upon evaluation today patient appears to be doing well currently in regard to his wound this is measuring smaller and looks well. Fortunately there does not appear to be any signs of active infection locally or systemically at this time which is great news. No fevers, chills, nausea, vomiting, or diarrhea. 07-29-2022 upon evaluation today patient appears to be doing well currently in regard to his wound. This is actually measuring smaller although there is a lot of hypergranulation noted. Fortunately there does not appear to be any signs of active infection locally or systemically at this time. No fevers, chills, nausea, vomiting, or diarrhea. 11/9; pressure ulcer on the right buttock in the setting of spina bifida and lower extremity paralysis. He has been using [facility] 09-02-2022 upon evaluation today patient appears to be doing well currently in regard to his wound. We have been using Hydrofera Blue this is showing signs of improvement as far as getting smaller is concerned. There is some drainage around the edges of the wound but I think a lot of this is due to the fact he did not even have a dressing on that he came in today. 12/29; right buttock pressure area which was initially stage IV. This is in the setting of a patient with spina bifida spends most of his time in a wheelchair. I am not certain how much he is offloading this. He has been using for [facility] as a primary dressing. When he was here the last visit he was felt to have hyper granulated tissue and was treated with silver nitrate chemical cauterization [name], [name] ([id number]) [id number] Page 3 of 10 10-14-2022 upon evaluation today patient appears to be doing poorly in regard to the wound in the right ischial location. Unfortunately he tells me that the dressing  seems to be falling off frequently. Unfortunately I think that this has contributed to him developing an infection which appears to be exemplified by the fact that he  has bone exposed which is actually necrotic and working its way out of the wound up and have to perform a fairly significant debridement today it does appear. I think that he needs to have these dressings ensure that they are in place at all times. Also discussed with the patient that we will get a need to do some testing in order to see if he indeed has an infection. If he does have a bone infection which I think is pretty likely to be osteomyelitis of this ischial location. I discussed that with the patient in detail today and depending on what we see him and make any adjustments in care as far as going forward is concerned. Obviously depressed antibiotic that we gave him that he tells me was somewhat irritating as far as the stomach side effects are concerned. He states he really would not want to be on that again. Either way I feel he is probably going to be placed on something once we get the results of the culture back. I am honestly concerned about the fact that he comes into the clinic without any dressings in place I am not sure how often they are really staying where they need to be as far as this wound is concerned. I actually cannot remember seeing him anytime in the past 2 months or so at least that he came in with a dressing intact. 10-21-2022 upon evaluation today patient presents for initial inspection here in our clinic concerning issues that he has been having with a wound in the sacral area. Again last time he was here I did debride away necrotic bone which was confirmed on pathology although it was not necessarily confirmed that this was secondary to osteomyelitis on the pathology report. Also the x-ray did not neither confirm nor deny the presence of osteomyelitis and his culture unfortunately showed multiple organisms  with nothing predominance of this does not help to rule and tailor down exactly what is going on here. Fortunately I do not see any signs of active infection systemically which is great news. With that being said locally I am definitely seeing some issues here. In fact this is warm to touch around the sacral area and I think that cellulitis is definitely present I just cannot pinpoint the exact organism. With that being said I did discuss with the patient as well today that I do believe that he probably needs an MRI in order to further quantify the extent of her likely osteomyelitis and this again is something that we are going to order this will need to be without contrast due to the fact that he is allergic to contrast dye. 11-18-2022 upon evaluation today patient's wound is actually showing some signs of improvement currently. We have been doing the Dakin's moistened gauze dressing which I think is helping to clean up the wound for seeing some granulation growing and still the tissue is not quite as healthy as I would love to see but better than previous. He does have osteomyelitis but the good news is he seems to be treated well with the antibiotics he does have a refill that was just picked up and I think that he needs to continue to take that for certain. 3/7; patient is on a 6-week clinic hiatus. He lives at home with his sister however she is not at home all the time. He transfers out of bed and a sliding board. He claims he is only in the in the wheelchair about 4  5 hours. He does have underlying osteomyelitis I am not sure about the status of the antibiotics here. Part of the wound still easily probes to bone 12-23-2022 upon evaluation today patient appears to be doing well currently in regard to his wound all things considered there is still a lot of hypergranulation but I do feel like that with the Vashe moistened gauze is actually doing better than he was previously with the Hydrofera Blue.  This just did not seem to be staying in place and with the Vashe through able to change this more frequently. Readmission: 03-31-2023 this is a gentleman whom I have seen multiple times intermittently throughout the years compliance has been a real issue however. I do feel like there is some significant issues here still with the fact that dressing changes and keeping dressings in place are still pretty much as a significant problem here for [place]. I do not see any signs of active infection at this time which is great news but at the same time the wounds do not worse in fact he has another wound different than what I even noted previous. Subsequently he I do believe that the patient should continue to do monitor for any evidence of infection or worsening. Overall I think he needs to have regular follow-up visits with [place] and I think that he needs to have dressings in place at all times. He tells me that he is at home primarily by himself transferring himself from the first thing in the morning through at least 3 in the afternoon as his sister works he really needs somebody gets a caregiver with him throughout the day. 04-12-2023 upon evaluation today patient appears to be doing a little worse currently in regard to his wounds. He has been tolerating the dressing changes without complication. Fortunately I do not see any evidence of active infection locally nor systemically at this point. 04-21-2023 upon evaluation today patient's wound unfortunately appears to have purulent drainage and also there is a malodorous drainage at this point. Unfortunately I think that there is something going on here that does not seem to be doing nearly as good as what we would like to see. I think that he is likely going to require some antibiotics. He has taken Cipro before without complication. 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 On 7/18 he had purulent drainage he had a PCR culture  and is now on Cipro he is tolerating this well. Using Hydrofera Blue's a 2 bit and border foam his sister is changing the dressing After his sister goes to work the patient is apparently in a wheelchair all day. There is no way to heal the wound like this with that degree of prolonged pressure. 05-12-2023 upon evaluation today patient unfortunately appears to be doing much worse than what we previously seen. Last time he was here he was seen by Dr. Leanord Hawking. This was shortly after I put him on Cipro he was tolerating that well. Nonetheless he did not show up last week apparently there was a transportation issue. This week upon arrival this wound appears to be doing significantly worse there is a significant malodorous drainage and this is deeper he has erythema in the gluteal region around it appears to be infected and to be honest despite the Cipro and oral antibiotics he seems to be doing worse not better. 05-19-2023 upon evaluation patient appears to be doing better than last time I saw  him with regard to his wound in the right gluteal location. Fortunately there does not appear to be any signs of systemic infection though he does have evidence of osteomyelitis noted which were acute on chronic and that was the findings that were noted in the hospital when he was admitted most recently after I sent him from May 12, 2023 through May 17, 2023. The patient was treated with IV Zosyn and bank while in the hospital and discharged on Augmentin although that has not been picked up yet it has been 2 days since he was discharged he states that "the pharmacy never called his sister to let her know it was ready". Nonetheless this needs to be gotten ASAP to make sure that he is fully treated as far as the infection is concerned I discussed that with him today as well. Electronic Signature(s) Signed: 05/19/2023 2:23:18 PM By: Allen Derry PA-C Previous Signature: 05/19/2023 2:20:11 PM Version By: Allen Derry  PA-C Entered By: Allen Derry on 05/19/2023 14:23:18 [name] ([id number]) [id number] Page 4 of 10 -------------------------------------------------------------------------------- Physical Exam Details Patient Name: Date of ServiceEulas [name] [facility], [name] 05/19/2023 1:30 PM Medical Record Number: [id number] Patient Account Number: [id number] Date of Birth/Sex: Treating RN: December 02, 1965 (56 y.o. [name] Primary Care Provider: Aram Beecham Other Clinician: Referring Provider: Treating Provider/Extender: Hermine Messick Weeks in Treatment: 7 Constitutional Well-nourished and well-hydrated in no acute distress. Respiratory normal breathing without difficulty. Psychiatric this patient is able to make decisions and demonstrates good insight into disease process. Alert and Oriented x 3. pleasant and cooperative. Notes Upon inspection patient's wound bed actually showed signs of being better than last time I saw him. This is improved though not really where we will see it. I do believe that he needs to make sure that dressings are staying in place I think one of the best things to do right now might be to put the dressing in place and then use boxer briefs that are tighter to try to hold everything in place of the when he is scooting or transitioning from the bed to the chair back-and-forth this hopefully will not pull the dressing off as easily. Right now the shorts that he wears are much more loose and this is not benefiting him in that regard. Electronic Signature(s) Signed: 05/19/2023 2:24:04 PM By: Allen Derry PA-C Entered By: Allen Derry on 05/19/2023 14:24:04 -------------------------------------------------------------------------------- Physician Orders Details Patient Name: Date of Service: [name] TH, [name] 05/19/2023 1:30 PM Medical Record Number: [id number] Patient Account Number: [id number] Date of Birth/Sex: Treating  RN: [birth year] (56 y.o. [name]) [name] Primary Care Provider: Aram Beecham Other Clinician: Referring Provider: Treating Provider/Extender: Gabriel Earing in Treatment: 7 Verbal / Phone Orders: No Diagnosis Coding ICD-10 Coding Code Description M86.68 Other chronic osteomyelitis, other site L89.314 Pressure ulcer of right buttock, stage 4 Q76.0 Spina bifida occulta Z93.3 Colostomy status I10 Essential (primary) hypertension I25.10 Atherosclerotic heart disease of native coronary artery without angina pectoris Follow-up Appointments Return Appointment in 1 week. Bathing/ [employer] wounds with antibacterial soap and water. Anesthetic (Use 'Patient Medications' Section for Anesthetic Order Entry) Lidocaine applied to wound bed Off-Loading [name], [name] ([id number]) [id number] Page 5 of 10 Low air-loss mattress (Group 2) - with hospital bed and trapeze Wound Treatment Wound #5 - Gluteus Wound Laterality: Right, Midline Cleanser: Byram Ancillary Kit - 15 Day Supply (Generic) 3 x Per Week/30 Days Discharge Instructions: Use supplies as instructed; Kit contains: (  15) Saline Bullets; (15) 3x3 Gauze; 15 pr Gloves Prim Dressing: Gauze 3 x Per Week/30 Days ary Discharge Instructions: moistened with Dakins Solution Secondary Dressing: (BORDER) Zetuvit Plus SILICONE BORDER Dressing 4x4 (in/in) (Generic) 3 x Per Week/30 Days Discharge Instructions: Please do not put silicone bordered dressings under wraps. Use non-bordered dressing only. Wound #6 - Gluteus Wound Laterality: Right, Lateral Cleanser: Byram Ancillary Kit - 15 Day Supply (Generic) 3 x Per Week/30 Days Discharge Instructions: Use supplies as instructed; Kit contains: (15) Saline Bullets; (15) 3x3 Gauze; 15 pr Gloves Prim Dressing: Gauze 3 x Per Week/30 Days ary Discharge Instructions: moistened with Dakins Solution Secondary Dressing: (BORDER) Zetuvit Plus  SILICONE BORDER Dressing 4x4 (in/in) (Generic) 3 x Per Week/30 Days Discharge Instructions: Please do not put silicone bordered dressings under wraps. Use non-bordered dressing only. Electronic Signature(s) Signed: 05/19/2023 2:26:41 PM By: [name] RN Signed: 05/20/2023 3:00:55 PM By: Allen Derry PA-C Entered By: [name] on 05/19/2023 14:26:41 -------------------------------------------------------------------------------- Problem List Details Patient Name: Date of Service: [name] TH, [name] 05/19/2023 1:30 PM Medical Record Number: [id number] Patient Account Number: [id number] Date of Birth/Sex: Treating RN: 30-Mar-1966 (56 y.o. [name] Primary Care Provider: Aram Beecham Other Clinician: Referring Provider: Treating Provider/Extender: Hermine Messick Weeks in Treatment: 7 Active Problems ICD-10 Encounter Code Description Active Date MDM Diagnosis M86.68 Other chronic osteomyelitis, other site 05/19/2023 No Yes L89.314 Pressure ulcer of right buttock, stage 4 03/31/2023 No Yes Q76.0 Spina bifida occulta 03/31/2023 No Yes Z93.3 Colostomy status 03/31/2023 No Yes I10 Essential (primary) hypertension 03/31/2023 No Yes [name], [name] ([id number]) [id number] Page 6 of 10 I25.10 Atherosclerotic heart disease of native coronary artery without angina pectoris 03/31/2023 No Yes Inactive Problems Resolved Problems Electronic Signature(s) Signed: 05/19/2023 2:21:01 PM By: Allen Derry PA-C Previous Signature: 05/19/2023 1:46:43 PM Version By: Allen Derry PA-C Previous Signature: 05/19/2023 1:41:24 PM Version By: Allen Derry PA-C Entered By: Allen Derry on 05/19/2023 14:21:01 -------------------------------------------------------------------------------- Progress Note Details Patient Name: Date of Service: [name] TH, [name] 05/19/2023 1:30 PM Medical Record Number: [id number] Patient Account Number: [id number] Date of Birth/Sex:  Treating RN: Nov 07, 1965 (56 y.o. [name] Primary Care Provider: Aram Beecham Other Clinician: Referring Provider: Treating Provider/Extender: Gabriel Earing in Treatment: 7 Subjective Chief Complaint Information obtained from Patient Right ischial tuberosity pressure ulcer with chronic osteomyelitis History of Present Illness (HPI) 57 year old male with a history of spina bifida presenting to [place] with a history of a open wound on the left gluteal region near his upper thigh which she's had for several weeks. He was seen in the ED recently at [facility] and was put on doxycycline and was advised some local care. his past medical history is significant for spinal bifid, neurogenic bladder, kidney stones, sacral pressure sores, constipation. Past surgical history significant for partial cystectomy, ileal conduit, VP shunt removal, percutaneous nephro lithotripsy. In the remote past the patient says he's had some treatment for a ulcer on this area and was treated with a skin graft. Readmission: 10/16/2021 upon evaluation today patient appears to be doing somewhat poorly in regard to a fairly large wound noted over the right ischial tuberosity location. This is a stage IV pressure ulcer. Of note this is also positive for osteomyelitis upon a CT scan which was performed during the time that he was admitted in the hospital on 09/19/2021. With that being said it is noted in the right inferior buttock that he has a decubitus  ulcer which extends to the posterior toe inferior right ischium where there is evidence of osteomyelitis. When he was here in the office actually missed that this was positive I missed read it and thought it said that there was no osteomyelitis. That is not the case there is in fact osteomyelitis noted here. I actually did review his notes as well in epic. Looking to the discharge summary it appears that the patient was admitted from  09/19/2021 through 09/23/2021. He was discharged home with home health care according to the note. With that being said from what I understand his sister actually takes care of him at this point. He was also to follow-up with a primary care provider and here at the wound care center within a week. I am just now seeing him on the 13th almost a month after discharge. He does have a history of again osteomyelitis of this right hip/ischial tuberosity location. 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 The reason for his admission was actually worsening of the decubitus ulcer upon admission. He does have chronic osteomyelitis of this area. He was treated with empiric antibiotics he had acute kidney injury which improved with IV fluids he also had hypokalemia. Surgical debridement was performed by general surgery at that time and ID was consulted to assist with management according to the notes. IV antibiotics were recommended but patient declined and wanted oral antibiotics at discharge. Therefore no IV antibiotics were planned for discharge time. This case was under the care of Dr. Joylene Draft while he was in the hospital when she did discharge him with a 2-week course of doxycycline and Augmentin according to the notes. It looks like Dr. Judithann Sheen office did call and leave a message for the patient to get scheduled for a follow-up visit after hospital discharge I do not see where he showed up in fact it appears that the patient has a no-show letter from Dr. Judithann Sheen office noted in epic due to a appointment that was missed on 10/06/2021. Looking at the pictures from Dr. Cristine Polio notes on 09/23/2021 it does appear that the wound is a little bit better as far as the cleanliness of the surface of the wound today but nonetheless still is a very significant  wound. 10/26/2021 upon evaluation today patient appears to be doing okay in regard to his wound. Again this is a wound that he did indeed have osteomyelitis and previous. With that being said I do not see any signs of a worsening infection at this time which is good news. Overall I think that we are headed in the right direction although still I think he needs more aggressive offloading. 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 I do believe this would be ideal for him to be honest. [name], [name] ([id number]) [id number] Page 7 of 10 Readmission: 01-15-2022 upon evaluation today patient presents for reevaluation he was last seen on October 26, 2021. At that time unfortunately he was having issues with a significant ischial pressure ulcer on the right ischial tuberosity. Subsequently he ended up having decent response to the Dakin's moistened gauze dressing that was previously being utilized and subsequently the patient's sister who is his primary caregiver as well states that she decided just to try to manage this at home. Nonetheless unfortunately this is getting  a little larger not better. He does spend a lot of the day in his chair. He does have an air mattress according what they tell me today. That was previously ordered. With that being said I do believe that at this time things do appear to be doing much better from the standpoint of infection I do not see any signs of overt infection. There is also no evidence of systemic infection. No fevers, chills, nausea, vomiting, or diarrhea. With that being said I do believe based on what I am seeing the wound is a bit cleaner and he possibly could be an excellent candidate for a wound VAC. 02-05-2022 upon evaluation today patient appears to be doing about the same in regard to his wound. Unfortunately he is not any better but he also really has not had the wound VAC on sufficiently. There is been  some confusion with the orders part of that I think is our follow-up with the way the order was written part of it may be with how the wound VAC is being placed either way we need to see what we can do to try to improve things in this regard. We will clarify the orders today going forward for home health. Unfortunately the patient does have a new wound today which is on the opposite side. This is not good 1 was bad enough have another area to open is definitely not a good thing. Its not as deep but nonetheless is also not very shallow either. We may potentially be able to get this to the point that we could bridge the 2 together in order to wound VAC both but right now want a focus on to try to get the wound VAC going for the main wound initially we will likely use Hydrofera Blue on the new wound.Marland Kitchen 02-19-2022 upon evaluation today patient's wound appears to be doing a little bit worse in the way of maceration fortunately there is no signs of active infection locally or systemically which is great news. No fevers, chills, nausea, vomiting, or diarrhea. 03-05-2022 upon evaluation today patient unfortunately does not appear to be doing nearly as good in regard to his wounds as I would like to see. Fortunately I do not see any evidence of active infection that is obvious although I am can obtain a wound culture to ensure that there is not anything getting worse here as far as the wound without the wound VAC on his concern. With that being said I will go ahead and get this sent in and evaluated will initiate treatment as needed going forward if necessary. With regard to the wound with the wound VAC this continues to be placed directly over the wound and there is obvious signs of deep tissue injury. The suction being here means that he is sitting on it I think this may be part of the reason why it is coming off although not 100% certain. 03-12-2022 upon evaluation today patient still has not gotten the  antibiotics that were called in for him 4 days ago. He tells me that his sister just told him this morning that they were ready but that she has not had a chance to go pick them up. Nonetheless I feel like that this is really something he needs any should have been taken that not the other antibiotics which were not doing the job for him to be honest. The patient states and I completely agree that there is really no way he would have known  relying on his sister to let him know obviously. Nonetheless I do believe that he needs to not be taking any other antibiotics and be on the Levaquin as soon as possible. He tells me that she said she was going to pick them up today. In regard to home health I am hopeful that they actually have been bridging this now. I do think that the wound looks better and this is good news. With that being said I am not certain that I can really tell for sure how this was attached it was removed before he came in and the black foam left in place. I really want him to leave the wound VAC in place until he comes in so that we can monitor and make sure that this is being done correctly he voiced understanding. 03-19-2022 upon evaluation today and much happier with how things appear as far as the patient's wounds are concerned. I think he is on a much better track and they are doing a better job at bridging although its not quite where I like to see it is still more posterior which I think the patient needs to be a little bit more lateral on his side. Either way this is something that I did marked with a small dot today for him to tell the home health nurses well so she can bridge it to that location also think the tubing should go up instead of going down to just make it less cumbersome overall for him. 04-19-2022 upon evaluation today patient appears to be doing a little better in regard to his wounds. Fortunately I do not see any signs of infection I do believe he is on the right  track. The wound VAC does seem to be helping to clean the wound up a bit and bring it in from the base at work. This is good news. 05-10-2022 upon evaluation today patient appears to be doing well currently in regard to his wounds in general in fact of the smaller of the 2 wounds without the wound VAC actually appears to be doing so much better this is almost completely closed and very pleased in that regard. He has been staying off of this he tells me and shows. With that being said the wound with the wound VAC is going require some sharp debridement there is some tissue which is not quite as healthy and we are going to go ahead and continue that for probably 2 more weeks although I think we may be closer to discontinuing this as well since it has healed and quite significantly. 05-24-2022 upon evaluation today patient appears to be doing well with regard to his wound in fact this looks better than I have been expected. This actually I think is a good time to go ahead and switch away from the wound VAC based on what I am seeing. I think we go to [facility] with a bordered foam dressing. 06-21-2022 upon evaluation today patient appears to be doing well currently in regard to his wound this is measuring smaller and looks well. Fortunately there does not appear to be any signs of active infection locally or systemically at this time which is great news. No fevers, chills, nausea, vomiting, or diarrhea. 07-29-2022 upon evaluation today patient appears to be doing well currently in regard to his wound. This is actually measuring smaller although there is a lot of hypergranulation noted. Fortunately there does not appear to be any signs of active infection locally or  systemically at this time. No fevers, chills, nausea, vomiting, or diarrhea. 11/9; pressure ulcer on the right buttock in the setting of spina bifida and lower extremity paralysis. He has been using [facility] 09-02-2022 upon evaluation  today patient appears to be doing well currently in regard to his wound. We have been using Hydrofera Blue this is showing signs of improvement as far as getting smaller is concerned. There is some drainage around the edges of the wound but I think a lot of this is due to the fact he did not even have a dressing on that he came in today. 12/29; right buttock pressure area which was initially stage IV. This is in the setting of a patient with spina bifida spends most of his time in a wheelchair. I am not certain how much he is offloading this. He has been using for [facility] as a primary dressing. When he was here the last visit he was felt to have hyper granulated tissue and was treated with silver nitrate chemical cauterization 10-14-2022 upon evaluation today patient appears to be doing poorly in regard to the wound in the right ischial location. Unfortunately he tells me that the dressing seems to be falling off frequently. Unfortunately I think that this has contributed to him developing an infection which appears to be exemplified by the fact that he has bone exposed which is actually necrotic and working its way out of the wound up and have to perform a fairly significant debridement today it does appear. I think that he needs to have these dressings ensure that they are in place at all times. Also discussed with the patient that we will get a need to do some testing in order to see if he indeed has an infection. If he does have a bone infection which I think is pretty likely to be osteomyelitis of this ischial location. I discussed that with the patient in detail today and depending on what we see him and make any adjustments in care as far as going forward is concerned. Obviously depressed antibiotic that we gave him that he tells me was somewhat irritating as far as the stomach side effects are concerned. He states he really would not want to be on that again. Either way I feel he is  probably going to be placed on something once we get the results of the culture back. I am honestly concerned about the fact that he comes into the clinic without any dressings in place I am not sure how often they are really staying where they need to be as far as this wound is concerned. I actually cannot remember seeing him anytime in the past 2 months or so at least that he came in with a dressing intact. 10-21-2022 upon evaluation today patient presents for initial inspection here in our clinic concerning issues that he has been having with a wound in the sacral area. Again last time he was here I did debride away necrotic bone which was confirmed on pathology although it was not necessarily confirmed that this was secondary to osteomyelitis on the pathology report. Also the x-ray did not neither confirm nor deny the presence of osteomyelitis and his culture unfortunately showed multiple organisms with nothing predominance of this does not help to rule and tailor down exactly what is going on here. Fortunately I do not see any signs of active infection systemically which is great news. With that being said locally I am definitely seeing some issues  here. In fact this is warm to touch around the sacral area and I think that cellulitis is definitely present I just cannot pinpoint the exact organism. With that being said I did discuss with the patient [name], [name] ([id number]) [id number] Page 8 of 10 as well today that I do believe that he probably needs an MRI in order to further quantify the extent of her likely osteomyelitis and this again is something that we are going to order this will need to be without contrast due to the fact that he is allergic to contrast dye. 11-18-2022 upon evaluation today patient's wound is actually showing some signs of improvement currently. We have been doing the Dakin's moistened gauze dressing which I think is helping to clean up the  wound for seeing some granulation growing and still the tissue is not quite as healthy as I would love to see but better than previous. He does have osteomyelitis but the good news is he seems to be treated well with the antibiotics he does have a refill that was just picked up and I think that he needs to continue to take that for certain. 3/7; patient is on a 6-week clinic hiatus. He lives at home with his sister however she is not at home all the time. He transfers out of bed and a sliding board. He claims he is only in the in the wheelchair about 4 5 hours. He does have underlying osteomyelitis I am not sure about the status of the antibiotics here. Part of the wound still easily probes to bone 12-23-2022 upon evaluation today patient appears to be doing well currently in regard to his wound all things considered there is still a lot of hypergranulation but I do feel like that with the Vashe moistened gauze is actually doing better than he was previously with the Hydrofera Blue. This just did not seem to be staying in place and with the Vashe through able to change this more frequently. Readmission: 03-31-2023 this is a gentleman whom I have seen multiple times intermittently throughout the years compliance has been a real issue however. I do feel like there is some significant issues here still with the fact that dressing changes and keeping dressings in place are still pretty much as a significant problem here for [place]. I do not see any signs of active infection at this time which is great news but at the same time the wounds do not worse in fact he has another wound different than what I even noted previous. Subsequently he I do believe that the patient should continue to do monitor for any evidence of infection or worsening. Overall I think he needs to have regular follow-up visits with [place] and I think that he needs to have dressings in place at all times. He tells me that he is at home primarily  by himself transferring himself from the first thing in the morning through at least 3 in the afternoon as his sister works he really needs somebody gets a caregiver with him throughout the day. 04-12-2023 upon evaluation today patient appears to be doing a little worse currently in regard to his wounds. He has been tolerating the dressing changes without complication. Fortunately I do not see any evidence of active infection locally nor systemically at this point. 04-21-2023 upon evaluation today patient's wound unfortunately appears to have purulent drainage and also there is a malodorous drainage at this point. Unfortunately I think that there is something going on here that  does not seem to be doing nearly as good as what we would like to see. I think that he is likely going to require some antibiotics. He has taken Cipro before without complication. 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 On 7/18 he had purulent drainage he had a PCR culture and is now on Cipro he is tolerating this well. Using Hydrofera Blue's a 2 bit and border foam his sister is changing the dressing After his sister goes to work the patient is apparently in a wheelchair all day. There is no way to heal the wound like this with that degree of prolonged pressure. 05-12-2023 upon evaluation today patient unfortunately appears to be doing much worse than what we previously seen. Last time he was here he was seen by Dr. Leanord Hawking. This was shortly after I put him on Cipro he was tolerating that well. Nonetheless he did not show up last week apparently there was a transportation issue. This week upon arrival this wound appears to be doing significantly worse there is a significant malodorous drainage and this is deeper he has erythema in the gluteal region around it appears to be infected and to be honest despite the Cipro and oral antibiotics he seems to be doing worse not better. 05-19-2023 upon  evaluation patient appears to be doing better than last time I saw him with regard to his wound in the right gluteal location. Fortunately there does not appear to be any signs of systemic infection though he does have evidence of osteomyelitis noted which were acute on chronic and that was the findings that were noted in the hospital when he was admitted most recently after I sent him from May 12, 2023 through May 17, 2023. The patient was treated with IV Zosyn and bank while in the hospital and discharged on Augmentin although that has not been picked up yet it has been 2 days since he was discharged he states that "the pharmacy never called his sister to let her know it was ready". Nonetheless this needs to be gotten ASAP to make sure that he is fully treated as far as the infection is concerned I discussed that with him today as well. Objective Constitutional Well-nourished and well-hydrated in no acute distress. Vitals Time Taken: 1:35 PM, Height: 59 in, Weight: 90 lbs, BMI: 18.2, Temperature: 97.5 F, Pulse: 111 bpm, Respiratory Rate: 18 breaths/min, Blood Pressure: 114/78 mmHg. Respiratory normal breathing without difficulty. Psychiatric this patient is able to make decisions and demonstrates good insight into disease process. Alert and Oriented x 3. pleasant and cooperative. General Notes: Upon inspection patient's wound bed actually showed signs of being better than last time I saw him. This is improved though not really where we will see it. I do believe that he needs to make sure that dressings are staying in place I think one of the best things to do right now might be to put the dressing in place and then use boxer briefs that are tighter to try to hold everything in place of the when he is scooting or transitioning from the bed to the chair back-and-forth this hopefully will not pull the dressing off as easily. Right now the shorts that he wears are much more loose and this is not  benefiting him in that regard. Integumentary (Hair, Skin) Wound #5 status is Open. Original cause of wound was Gradually Appeared. The date acquired was: 10/04/2021. The wound has been  in treatment 7 weeks. The wound is located on the Right,Midline Gluteus. The wound measures 6.3cm length x 4.5cm width x 3.2cm depth; 22.266cm^2 area and 71.251cm^3 volume. There is bone, muscle, and Fat Layer (Subcutaneous Tissue) exposed. There is no tunneling or undermining noted. There is a large amount of serosanguineous drainage noted. There is medium (34-66%) red granulation within the wound bed. There is a medium (34-66%) amount of necrotic tissue within the wound bed. Wound #6 status is Open. Original cause of wound was Gradually Appeared. The date acquired was: 10/04/2021. The wound has been in treatment 7 weeks. The wound is located on the Right,Lateral Gluteus. The wound measures 3cm length x 3.7cm width x 2cm depth; 8.718cm^2 area and 17.436cm^3 volume. There is Fat Layer (Subcutaneous Tissue) exposed. There is no tunneling or undermining noted. There is a medium amount of serosanguineous drainage noted. There is small (1-33%) red granulation within the wound bed. There is a large (67-100%) amount of necrotic tissue within the wound bed including Adherent Slough. [place], [name] ([id number]) [id number] Page 9 of 10 Assessment Active Problems ICD-10 Other chronic osteomyelitis, other site Pressure ulcer of right buttock, stage 4 Spina bifida occulta Colostomy status Essential (primary) hypertension Atherosclerotic heart disease of native coronary artery without angina pectoris Plan 1. I would recommend at this time the patient should get the Augmentin filled and go ahead and take this to completion as recommended. I think in 2 weeks we will see where things stand and make any adjustments in care is necessary at that point. 2. I am good recommend as well the patient should  offload is much as possible try to keep pressure off of the gluteal region. 3. I am also going to recommend that he should continue to monitor for any signs of infection or worsening. Obviously if anything changes he should contact the office and let me know. We will see patient back for reevaluation in 1 week here in the clinic. If anything worsens or changes patient will contact our office for additional recommendations. Electronic Signature(s) Signed: 05/19/2023 2:24:42 PM By: Allen Derry PA-C Entered By: Allen Derry on 05/19/2023 14:24:42 -------------------------------------------------------------------------------- SuperBill Details Patient Name: Date of Service: [name] TH, [name] 05/19/2023 Medical Record Number: [id number] Patient Account Number: [id number] Date of Birth/Sex: Treating RN: 11-11-1965 (56 y.o. [name]) [name] Primary Care Provider: Aram Beecham Other Clinician: Referring Provider: Treating Provider/Extender: Hermine Messick Weeks in Treatment: 7 Diagnosis Coding ICD-10 Codes Code Description [phone] Other chronic osteomyelitis, other site L89.314 Pressure ulcer of right buttock, stage 4 Q76.0 Spina bifida occulta Z93.3 Colostomy status I10 Essential (primary) hypertension I25.10 Atherosclerotic heart disease of native coronary artery without angina pectoris Facility Procedures : Candie Chroman Code: [id number] [name], [name] ([id number]) Description: [phone] - WOUND CARE VISIT-LEV 2 EST PT [id number]: [id number] Page 10 of Quantity: 1 10 Physician Procedures : CPT4 Code Description Modifier [id number] - WC PHYS LEVEL 4 - NEW PT ICD-10 Diagnosis Description M86.68 Other chronic osteomyelitis, other site L89.314 Pressure ulcer of right buttock, stage 4 Q76.0 Spina bifida occulta Z93.3 Colostomy status Quantity: 1 Electronic Signature(s) Signed: 05/19/2023 2:29:19 PM By: [name] RN Signed: 05/20/2023 3:00:55 PM By:  Allen Derry PA-C Previous Signature: 05/19/2023 2:25:09 PM Version By: Allen Derry PA-C Entered By: [name] on 05/19/2023 14:29:19

## 2023-05-26 ENCOUNTER — Encounter: Payer: 59 | Admitting: Physician Assistant

## 2023-05-26 DIAGNOSIS — L89314 Pressure ulcer of right buttock, stage 4: Secondary | ICD-10-CM | POA: Diagnosis not present

## 2023-05-26 NOTE — Progress Notes (Signed)
[place], [name] ([id number]) [id number] Page 1 of 10 Visit Report for 05/26/2023 Arrival Information Details Patient Name: Date of ServiceEulas [name] [facility], [name] 05/26/2023 2:00 PM Medical Record Number: [id number] Patient Account Number: [id number] Date of Birth/Sex: Treating RN: December 04, 1965 (57 y.o. [name] Primary Care [name]: [name] Other Clinician: Referring Tamerra Merkley: Treating Kirbie Stodghill/Extender: [name] in Treatment: 8 Visit Information History Since Last Visit Added or deleted any medications: No Patient Arrived: Wheel Chair Any new allergies or adverse reactions: No Arrival Time: 14:00 Had a fall or experienced change in No Accompanied By: self activities of daily living that may affect Transfer Assistance: Transfer Board risk of falls: Patient Identification Verified: Yes Hospitalized since last visit: No Secondary Verification Process Completed: Yes Has Dressing in Place as Prescribed: Yes Patient Requires Transmission-Based Precautions: No Pain Present Now: No Patient Has Alerts: No Electronic Signature(s) Signed: 05/26/2023 5:00:29 PM By: [name] Entered By: [name] on 05/26/2023 11:01:41 -------------------------------------------------------------------------------- Clinic Level of Care Assessment Details Patient Name: Date of ServiceEulas [name] [facility], [name] 05/26/2023 2:00 PM Medical Record Number: [id number] Patient Account Number: [id number] Date of Birth/Sex: Treating RN: [birth year] (57 y.o. [name] Primary Care Analisse Randle: [name] Other Clinician: Referring Maziyah Vessel: Treating Daelon Dunivan/Extender: [name] in Treatment: 8 Clinic Level of Care Assessment Items TOOL 4 Quantity Score []  - 0 Use when only an EandM is performed on FOLLOW-UP visit ASSESSMENTS - Nursing Assessment / Reassessment X- 1 10 Reassessment of Co-morbidities  (includes updates in patient status) X- 1 5 Reassessment of Adherence to Treatment Plan ASSESSMENTS - Wound and Skin A ssessment / Reassessment []  - 0 Simple Wound Assessment / Reassessment - one wound X- 2 5 Complex Wound Assessment / Reassessment - multiple wounds PROMETHEUS, CRONEY ([id number]) [id number] Page 2 of 10 []  - 0 Dermatologic / Skin Assessment (not related to wound area) ASSESSMENTS - Focused Assessment []  - 0 Circumferential Edema Measurements - multi extremities []  - 0 Nutritional Assessment / Counseling / Intervention []  - 0 Lower Extremity Assessment (monofilament, tuning fork, pulses) []  - 0 Peripheral Arterial Disease Assessment (using hand held doppler) ASSESSMENTS - Ostomy and/or Continence Assessment and Care []  - 0 Incontinence Assessment and Management []  - 0 Ostomy Care Assessment and Management (repouching, etc.) PROCESS - Coordination of Care X - Simple Patient / Family Education for ongoing care 1 15 []  - 0 Complex (extensive) Patient / Family Education for ongoing care X- 1 10 Staff obtains Chiropractor, Records, T Results / Process Orders est []  - 0 Staff telephones HHA, Nursing Homes / Clarify orders / etc []  - 0 Routine Transfer to another Facility (non-emergent condition) []  - 0 Routine Hospital Admission (non-emergent condition) []  - 0 New Admissions / Manufacturing engineer / Ordering NPWT Apligraf, etc. , []  - 0 Emergency Hospital Admission (emergent condition) X- 1 10 Simple Discharge Coordination []  - 0 Complex (extensive) Discharge Coordination PROCESS - Special Needs []  - 0 Pediatric / Minor Patient Management []  - 0 Isolation Patient Management []  - 0 Hearing / Language / Visual special needs []  - 0 Assessment of Community assistance (transportation, D/C planning, etc.) []  - 0 Additional assistance / Altered mentation []  - 0 Support Surface(s) Assessment (bed, cushion, seat,  etc.) INTERVENTIONS - Wound Cleansing / Measurement []  - 0 Simple Wound Cleansing - one wound X- 2 5 Complex Wound Cleansing - multiple wounds X- 1 5 Wound Imaging (photographs - any number of wounds) []  - 0  Wound Tracing (instead of photographs) []  - 0 Simple Wound Measurement - one wound X- 2 5 Complex Wound Measurement - multiple wounds INTERVENTIONS - Wound Dressings []  - 0 Small Wound Dressing one or multiple wounds X- 2 15 Medium Wound Dressing one or multiple wounds []  - 0 Large Wound Dressing one or multiple wounds []  - 0 Application of Medications - topical []  - 0 Application of Medications - injection INTERVENTIONS - Miscellaneous []  - 0 External ear exam []  - 0 Specimen Collection (cultures, biopsies, blood, body fluids, etc.) []  - 0 Specimen(s) / Culture(s) sent or taken to Lab for analysis [name], [name] ([id number]) [id number] Page 3 of 10 []  - 0 Patient Transfer (multiple staff / Nurse, adult / Similar devices) []  - 0 Simple Staple / Suture removal (25 or less) []  - 0 Complex Staple / Suture removal (26 or more) []  - 0 Hypo / Hyperglycemic Management (close monitor of Blood Glucose) []  - 0 Ankle / Brachial Index (ABI) - do not check if billed separately X- 1 5 Vital Signs Has the patient been seen at the hospital within the last three years: Yes Total Score: 120 Level Of Care: New/Established - Level 4 Electronic Signature(s) Signed: 05/26/2023 5:00:29 PM By: [name] Entered By: [name] on 05/26/2023 11:53:50 -------------------------------------------------------------------------------- Encounter Discharge Information Details Patient Name: Date of Service: [name] TH, [name] 05/26/2023 2:00 PM Medical Record Number: [id number] Patient Account Number: [id number] Date of Birth/Sex: Treating RN: Nov 02, 1965 (57 y.o. [name] Primary Care Kymani Laursen: [name] Other Clinician: Referring  Arlana Canizales: Treating Kasi Lasky/Extender: [name] in Treatment: 8 Encounter Discharge Information Items Discharge Condition: Stable Ambulatory Status: Wheelchair Discharge Destination: Home Transportation: Other Accompanied By: self Schedule Follow-up Appointment: Yes Clinical Summary of Care: Electronic Signature(s) Signed: 05/26/2023 2:55:18 PM By: [name] Entered By: [name] on 05/26/2023 11:55:18 -------------------------------------------------------------------------------- Lower Extremity Assessment Details Patient Name: Date of ServiceEulas [name] [facility], [name] 05/26/2023 2:00 PM Medical Record Number: [id number] Patient Account Number: [id number] Date of Birth/Sex: Treating RN: April 13, 1966 (57 y.o. [name] Primary Care Yaasir Menken: [name] Other Clinician: Referring Layney Gillson: Treating Tianah Lonardo/Extender: [name] [place], [place] ([id number]) [id number] Page 4 of 10 Weeks in Treatment: 8 Electronic Signature(s) Signed: 05/26/2023 5:00:29 PM By: [name] Entered By: [name] on 05/26/2023 11:14:57 -------------------------------------------------------------------------------- Multi Wound Chart Details Patient Name: Date of Service: [name] TH, [name] 05/26/2023 2:00 PM Medical Record Number: [id number] Patient Account Number: [id number] Date of Birth/Sex: Treating RN: 1966-08-27 (57 y.o. [name] Primary Care Corbet Hanley: [name] Other Clinician: Referring Jacaden Forbush: Treating Taunia Frasco/Extender: [name] Weeks in Treatment: 8 Vital Signs Height(in): 59 Pulse(bpm): 117 Weight(lbs): 90 Blood Pressure(mmHg): 105/67 Body Mass Index(BMI): 18.2 Temperature(F): 97.6 Respiratory Rate(breaths/min): 18 [5:Photos:] [N/A:N/A] Right, Midline Gluteus Right, Lateral Gluteus N/A Wound Location: Gradually Appeared Gradually Appeared  N/A Wounding Event: Pressure Ulcer Pressure Ulcer N/A Primary Etiology: Cataracts, Glaucoma, Coronary Artery Cataracts, Glaucoma, Coronary Artery N/A Comorbid History: Disease, Hypertension, History of Disease, Hypertension, History of pressure wounds, Osteoarthritis, pressure wounds, Osteoarthritis, Paraplegia Paraplegia 10/04/2021 10/04/2021 N/A Date Acquired: 8 8 N/A Weeks of Treatment: Open Open N/A Wound Status: No No N/A Wound Recurrence: 4x5x2.1 3x2x1.1 N/A Measurements L x W x D (cm) 15.708 4.712 N/A A (cm) : rea 32.987 5.184 N/A Volume (cm) : -60.00% -135.20% N/A % Reduction in A rea: -124.00% -1192.80% N/A % Reduction in Volume: Category/Stage IV Category/Stage IV N/A Classification: Large Medium N/A  Exudate A mount: Serosanguineous Serosanguineous N/A Exudate Type: red, brown red, brown N/A Exudate Color: Medium (34-66%) Large (67-100%) N/A Granulation A mount: Red, Pink Red, Pink N/A Granulation Quality: Medium (34-66%) Small (1-33%) N/A Necrotic A mount: Fat Layer (Subcutaneous Tissue): Yes Fat Layer (Subcutaneous Tissue): Yes N/A Exposed Structures: Muscle: Yes Fascia: No Bone: Yes Tendon: No Fascia: No Muscle: No Tendon: No Joint: No Joint: No Bone: No None None N/A Epithelialization: [name] ([id number]) [id number] Page 5 of 10 Treatment Notes Electronic Signature(s) Signed: 05/26/2023 2:49:43 PM By: [name] Entered By: [name] on 05/26/2023 11:49:43 -------------------------------------------------------------------------------- Multi-Disciplinary Care Plan Details Patient Name: Date of Service: [facility], [name] 05/26/2023 2:00 PM Medical Record Number: [id number] Patient Account Number: [id number] Date of Birth/Sex: Treating RN: [birth year] (57 y.o. [name] Primary Care Neftali Thurow: [name] Other Clinician: Referring Geo Slone: Treating Mekenzie Modeste/Extender: [name] Weeks in Treatment: 8 Active Inactive Pressure Nursing Diagnoses: Potential for impaired tissue integrity related to pressure, friction, moisture, and shear Goals: Patient will remain free from development of additional pressure ulcers Date Initiated: 03/31/2023 Target Resolution Date: 07/01/2023 Goal Status: Active Interventions: Assess: immobility, friction, shearing, incontinence upon admission and as needed Assess offloading mechanisms upon admission and as needed Assess potential for pressure ulcer upon admission and as needed Notes: Wound/Skin Impairment Nursing Diagnoses: Knowledge deficit related to ulceration/compromised skin integrity Goals: Patient/caregiver will verbalize understanding of skin care regimen Date Initiated: 03/31/2023 Target Resolution Date: 07/01/2023 Goal Status: Active Ulcer/skin breakdown will have a volume reduction of 30% by week 4 Date Initiated: 03/31/2023 Target Resolution Date: 05/31/2023 Goal Status: Active Ulcer/skin breakdown will have a volume reduction of 50% by week 8 Date Initiated: 03/31/2023 Target Resolution Date: 07/01/2023 Goal Status: Active Ulcer/skin breakdown will have a volume reduction of 80% by week 12 Date Initiated: 03/31/2023 Target Resolution Date: 07/31/2023 Goal Status: Active Ulcer/skin breakdown will heal within 14 weeks Date Initiated: 03/31/2023 Target Resolution Date: 08/31/2023 Goal Status: Active Interventions: Assess patient/caregiver ability to obtain necessary supplies Assess patient/caregiver ability to perform ulcer/skin care regimen upon admission and as needed [name], [name] ([id number]) [id number] Page 6 of 10 Assess ulceration(s) every visit Notes: Electronic Signature(s) Signed: 05/26/2023 2:54:12 PM By: [name] Entered By: [name] on 05/26/2023  11:54:12 -------------------------------------------------------------------------------- Pain Assessment Details Patient Name: Date of ServiceEulas [name] [facility], [name] 05/26/2023 2:00 PM Medical Record Number: [id number] Patient Account Number: [id number] Date of Birth/Sex: Treating RN: 02-May-1966 (57 y.o. [name] Primary Care Benz Vandenberghe: [name] Other Clinician: Referring Albino Bufford: Treating [place] Vandergriff/Extender: [name] Weeks in Treatment: 8 Active Problems Location of Pain Severity and Description of Pain Patient Has Paino No Site Locations Rate the pain. Current Pain Level: 0 Pain Management and Medication Current Pain Management: Electronic Signature(s) Signed: 05/26/2023 5:00:29 PM By: [name] Entered By: [name] on 05/26/2023 11:02:11 [name] ([id number]) [id number] Page 7 of 10 -------------------------------------------------------------------------------- Patient/Caregiver Education Details Patient Name: Date of ServiceEulas [name] [facility], DO [place] LD 8/22/2024andnbsp2:00 PM Medical Record Number: [id number] Patient Account Number: [id number] Date of Birth/Gender: Treating RN: 06/23/1966 (57 y.o. [name] Primary Care Physician: [name] Other Clinician: Referring Physician: Treating Physician/Extender: [name] in Treatment: 8 Education Assessment Education Provided To: Patient Education Topics Provided Wound/Skin Impairment: Handouts: Caring for Your Ulcer Methods: Explain/Verbal Responses: State content correctly Electronic Signature(s) Signed: 05/26/2023 5:00:29 PM By: [name] Entered By: [name] on 05/26/2023 11:54:36 -------------------------------------------------------------------------------- Wound Assessment Details Patient Name:  Date of ServiceEulas [name] [facility], [name] 05/26/2023 2:00 PM Medical Record Number:  [id number] Patient Account Number: [id number] Date of Birth/Sex: Treating RN: Sep 01, 1966 (57 y.o. [name] Primary Care Amory Zbikowski: [name] Other Clinician: Referring Elva Breaker: Treating Catalena Stanhope/Extender: [name] Weeks in Treatment: 8 Wound Status Wound Number: 5 Primary Pressure Ulcer Etiology: Wound Location: Right, Midline Gluteus Wound Open Wounding Event: Gradually Appeared Status: Date Acquired: 10/04/2021 Comorbid Cataracts, Glaucoma, Coronary Artery Disease, Hypertension, Weeks Of Treatment: 8 History: History of pressure wounds, Osteoarthritis, Paraplegia Clustered Wound: No Photos [place], [name] ([id number]) [id number] Page 8 of 10 Wound Measurements Length: (cm) 4 Width: (cm) 5 Depth: (cm) 2.1 Area: (cm) 15.708 Volume: (cm) 32.987 % Reduction in Area: -60% % Reduction in Volume: -124% Epithelialization: None Tunneling: No Undermining: No Wound Description Classification: Category/Stage IV Exudate Amount: Large Exudate Type: Serosanguineous Exudate Color: red, brown Foul Odor After Cleansing: No Slough/Fibrino Yes Wound Bed Granulation Amount: Medium (34-66%) Exposed Structure Granulation Quality: Red, Pink Fascia Exposed: No Necrotic Amount: Medium (34-66%) Fat Layer (Subcutaneous Tissue) Exposed: Yes Necrotic Quality: Adherent Slough Tendon Exposed: No Muscle Exposed: Yes Necrosis of Muscle: No Joint Exposed: No Bone Exposed: Yes Treatment Notes Wound #5 (Gluteus) Wound Laterality: Right, Midline Cleanser Byram Ancillary Kit - 15 Day Supply Discharge Instruction: Use supplies as instructed; Kit contains: (15) Saline Bullets; (15) 3x3 Gauze; 15 pr Gloves Peri-Wound Care Topical Primary Dressing Gauze Discharge Instruction: moistened with Dakins Solution Secondary Dressing (BORDER) Zetuvit Plus SILICONE BORDER Dressing 4x4 (in/in) Discharge Instruction: Please do not put silicone  bordered dressings under wraps. Use non-bordered dressing only. Secured With Compression Wrap Compression Stockings Facilities manager) Signed: 05/26/2023 5:00:29 PM By: [name] Entered By: [name] on 05/26/2023 11:13:20 [name] ([id number]) [id number] Page 9 of 10 -------------------------------------------------------------------------------- Wound Assessment Details Patient Name: Date of ServiceEulas [name] [facility], [name] 05/26/2023 2:00 PM Medical Record Number: [id number] Patient Account Number: [id number] Date of Birth/Sex: Treating RN: [birth year] (57 y.o. [name] Primary Care Jeferson Boozer: [name] Other Clinician: Referring Koki Buxton: Treating Eadie Repetto/Extender: [name] Weeks in Treatment: 8 Wound Status Wound Number: 6 Primary Pressure Ulcer Etiology: Wound Location: Right, Lateral Gluteus Wound Open Wounding Event: Gradually Appeared Status: Date Acquired: 10/04/2021 Comorbid Cataracts, Glaucoma, Coronary Artery Disease, Hypertension, Weeks Of Treatment: 8 History: History of pressure wounds, Osteoarthritis, Paraplegia Clustered Wound: No Photos Wound Measurements Length: (cm) 3 Width: (cm) 2 Depth: (cm) 1.1 Area: (cm) 4.712 Volume: (cm) 5.184 % Reduction in Area: -135.2% % Reduction in Volume: -1192.8% Epithelialization: None Tunneling: No Undermining: No Wound Description Classification: Category/Stage IV Exudate Amount: Medium Exudate Type: Serosanguineous Exudate Color: red, brown Foul Odor After Cleansing: No Slough/Fibrino Yes Wound Bed Granulation Amount: Large (67-100%) Exposed Structure Granulation Quality: Red, Pink Fascia Exposed: No Necrotic Amount: Small (1-33%) Fat Layer (Subcutaneous Tissue) Exposed: Yes Necrotic Quality: Adherent Slough Tendon Exposed: No Muscle Exposed: No Joint Exposed: No Bone Exposed: No Treatment Notes Wound #6  (Gluteus) Wound Laterality: Right, Lateral Cleanser Byram Ancillary Kit - 15 Day Supply Discharge Instruction: Use supplies as instructed; Kit contains: (15) Saline Bullets; (15) 3x3 Gauze; 15 pr Gloves Peri-Wound Care [place], [name] ([id number]) [id number] Page 10 of 10 Topical Primary Dressing Gauze Discharge Instruction: moistened with Dakins Solution Secondary Dressing (BORDER) Zetuvit Plus SILICONE BORDER Dressing 4x4 (in/in) Discharge Instruction: Please do not put silicone bordered dressings under wraps. Use non-bordered dressing only. Secured With Compression Wrap Compression Stockings Facilities manager) Signed: 05/26/2023 5:00:29 PM By:  [name] Entered By: [name] on 05/26/2023 11:14:08 -------------------------------------------------------------------------------- Vitals Details Patient Name: Date of Service: [name] [facility], [name] 05/26/2023 2:00 PM Medical Record Number: [id number] Patient Account Number: [id number] Date of Birth/Sex: Treating RN: 1966/09/05 (57 y.o. [name] Primary Care Brylan Dec: [name] Other Clinician: Referring Marchelle Rinella: Treating Wing Gfeller/Extender: [name] Weeks in Treatment: 8 Vital Signs Time Taken: 14:01 Temperature (F): 97.6 Height (in): 59 Pulse (bpm): 117 Weight (lbs): 90 Respiratory Rate (breaths/min): 18 Body Mass Index (BMI): 18.2 Blood Pressure (mmHg): 105/67 Reference Range: 80 - 120 mg / dl Electronic Signature(s) Signed: 05/26/2023 5:00:29 PM By: [name] Entered By: [name] on 05/26/2023 11:02:04

## 2023-05-26 NOTE — Progress Notes (Addendum)
[place], [name] ([id number]) [id number] Page 1 of 10 Visit Report for 05/26/2023 Chief Complaint Document Details Patient Name: Date of Service: [name] [facility], [name] 05/26/2023 2:00 PM Medical Record Number: [id number] Patient Account Number: [id number] Date of Birth/Sex: Treating RN: [birth year] (57 y.o. [name] Primary Care Provider: Aram Beecham Other Clinician: Referring Provider: Treating Provider/Extender: Gabriel Earing in Treatment: 8 Information Obtained from: Patient Chief Complaint Right ischial tuberosity pressure ulcer with chronic osteomyelitis Electronic Signature(s) Signed: 05/26/2023 2:10:13 PM By: Allen Derry PA-C Entered By: Allen Derry on 05/26/2023 11:10:13 -------------------------------------------------------------------------------- HPI Details Patient Name: Date of Service: [name] [facility], [name] 05/26/2023 2:00 PM Medical Record Number: [id number] Patient Account Number: [id number] Date of Birth/Sex: Treating RN: March 17, 1966 (57 y.o. [name] Primary Care Provider: Aram Beecham Other Clinician: Referring Provider: Treating Provider/Extender: Gabriel Earing in Treatment: 8 History of Present Illness HPI Description: 57 year old male with a history of spina bifida presenting to [place] with a history of a open wound on the left gluteal region near his upper thigh which she's had for several weeks. He was seen in the ED recently at [facility] and was put on doxycycline and was advised some local care. his past medical history is significant for spinal bifid, neurogenic bladder, kidney stones, sacral pressure sores, constipation. Past surgical history significant for partial cystectomy, ileal conduit, VP shunt removal, percutaneous nephro lithotripsy. In the remote past the patient says he's had some treatment for a ulcer on this area and was treated with a skin  graft. Readmission: 10/16/2021 upon evaluation today patient appears to be doing somewhat poorly in regard to a fairly large wound noted over the right ischial tuberosity location. This is a stage IV pressure ulcer. Of note this is also positive for osteomyelitis upon a CT scan which was performed during the time that he was admitted in the hospital on 09/19/2021. With that being said it is noted in the right inferior buttock that he has a decubitus ulcer which extends to the posterior toe inferior right ischium where there is evidence of osteomyelitis. When he was here in the office actually missed that this was positive I missed read it and thought it said that there was no osteomyelitis. That is not the case there is in fact osteomyelitis noted here. I actually did review his notes as well in epic. Looking to the discharge summary it appears that the patient was admitted from 09/19/2021 through 09/23/2021. He was discharged home with home health care according to the note. With that being said from what I understand his sister actually takes care of him at this point. He was also to follow-up with a primary care provider and here at the wound care center within a week. I am just now seeing him on the 13th almost a KEIVEN, ACHUFF ([id number]) [id number] Page 2 of 10 month after discharge. He does have a history of again osteomyelitis of this right hip/ischial tuberosity location. 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 The reason for his admission was actually worsening of the decubitus ulcer upon admission. He does have chronic osteomyelitis of this area. He was treated with empiric antibiotics he had acute kidney injury which improved with IV fluids he also had hypokalemia. Surgical debridement was performed by general  surgery at that time and ID was consulted to assist with management according to the notes. IV antibiotics were recommended but patient declined and wanted oral antibiotics at discharge. Therefore no IV antibiotics were planned for discharge time. This case was under the care of Dr. Joylene Draft while he was in the hospital when she did discharge him with a 2-week course of doxycycline and Augmentin according to the notes. It looks like Dr. Judithann Sheen office did call and leave a message for the patient to get scheduled for a follow-up visit after hospital discharge I do not see where he showed up in fact it appears that the patient has a no-show letter from Dr. Judithann Sheen office noted in epic due to a appointment that was missed on 10/06/2021. Looking at the pictures from Dr. Cristine Polio notes on 09/23/2021 it does appear that the wound is a little bit better as far as the cleanliness of the surface of the wound today but nonetheless still is a very significant wound. 10/26/2021 upon evaluation today patient appears to be doing okay in regard to his wound. Again this is a wound that he did indeed have osteomyelitis and previous. With that being said I do not see any signs of a worsening infection at this time which is good news. Overall I think that we are headed in the right direction although still I think he needs more aggressive offloading. 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 I do believe this would be ideal for him to be honest. Readmission: 01-15-2022 upon evaluation today patient presents for reevaluation he was last seen on October 26, 2021. At that time unfortunately he was having issues with a significant ischial pressure ulcer on the right ischial tuberosity. Subsequently he ended up having decent response to the Dakin's moistened gauze dressing that was previously being utilized and subsequently the patient's sister who is his primary caregiver as well states that she  decided just to try to manage this at home. Nonetheless unfortunately this is getting a little larger not better. He does spend a lot of the day in his chair. He does have an air mattress according what they tell me today. That was previously ordered. With that being said I do believe that at this time things do appear to be doing much better from the standpoint of infection I do not see any signs of overt infection. There is also no evidence of systemic infection. No fevers, chills, nausea, vomiting, or diarrhea. With that being said I do believe based on what I am seeing the wound is a bit cleaner and he possibly could be an excellent candidate for a wound VAC. 02-05-2022 upon evaluation today patient appears to be doing about the same in regard to his wound. Unfortunately he is not any better but he also really has not had the wound VAC on sufficiently. There is been some confusion with the orders part of that I think is our follow-up with the way the order was written part of it may be with how the wound VAC is being placed either way we need to see what we can do to try to improve things in this regard. We will clarify the orders today going forward for home health. Unfortunately the patient does have a new wound today which is on the opposite side. This is not good 1 was bad enough have another area to open is definitely not a good thing. Its not as deep but nonetheless  is also not very shallow either. We may potentially be able to get this to the point that we could bridge the 2 together in order to wound VAC both but right now want a focus on to try to get the wound VAC going for the main wound initially we will likely use Hydrofera Blue on the new wound.Marland Kitchen 02-19-2022 upon evaluation today patient's wound appears to be doing a little bit worse in the way of maceration fortunately there is no signs of active infection locally or systemically which is great news. No fevers, chills, nausea, vomiting,  or diarrhea. 03-05-2022 upon evaluation today patient unfortunately does not appear to be doing nearly as good in regard to his wounds as I would like to see. Fortunately I do not see any evidence of active infection that is obvious although I am can obtain a wound culture to ensure that there is not anything getting worse here as far as the wound without the wound VAC on his concern. With that being said I will go ahead and get this sent in and evaluated will initiate treatment as needed going forward if necessary. With regard to the wound with the wound VAC this continues to be placed directly over the wound and there is obvious signs of deep tissue injury. The suction being here means that he is sitting on it I think this may be part of the reason why it is coming off although not 100% certain. 03-12-2022 upon evaluation today patient still has not gotten the antibiotics that were called in for him 4 days ago. He tells me that his sister just told him this morning that they were ready but that she has not had a chance to go pick them up. Nonetheless I feel like that this is really something he needs any should have been taken that not the other antibiotics which were not doing the job for him to be honest. The patient states and I completely agree that there is really no way he would have known relying on his sister to let him know obviously. Nonetheless I do believe that he needs to not be taking any other antibiotics and be on the Levaquin as soon as possible. He tells me that she said she was going to pick them up today. In regard to home health I am hopeful that they actually have been bridging this now. I do think that the wound looks better and this is good news. With that being said I am not certain that I can really tell for sure how this was attached it was removed before he came in and the black foam left in place. I really want him to leave the wound VAC in place until he comes in so that we  can monitor and make sure that this is being done correctly he voiced understanding. 03-19-2022 upon evaluation today and much happier with how things appear as far as the patient's wounds are concerned. I think he is on a much better track and they are doing a better job at bridging although its not quite where I like to see it is still more posterior which I think the patient needs to be a little bit more lateral on his side. Either way this is something that I did marked with a small dot today for him to tell the home health nurses well so she can bridge it to that location also think the tubing should go up instead of going down to just  make it less cumbersome overall for him. 04-19-2022 upon evaluation today patient appears to be doing a little better in regard to his wounds. Fortunately I do not see any signs of infection I do believe he is on the right track. The wound VAC does seem to be helping to clean the wound up a bit and bring it in from the base at work. This is good news. 05-10-2022 upon evaluation today patient appears to be doing well currently in regard to his wounds in general in fact of the smaller of the 2 wounds without the wound VAC actually appears to be doing so much better this is almost completely closed and very pleased in that regard. He has been staying off of this he tells me and shows. With that being said the wound with the wound VAC is going require some sharp debridement there is some tissue which is not quite as healthy and we are going to go ahead and continue that for probably 2 more weeks although I think we may be closer to discontinuing this as well since it has healed and quite significantly. 05-24-2022 upon evaluation today patient appears to be doing well with regard to his wound in fact this looks better than I have been expected. This actually I think is a good time to go ahead and switch away from the wound VAC based on what I am seeing. I think we go to  [facility] with a bordered foam dressing. 06-21-2022 upon evaluation today patient appears to be doing well currently in regard to his wound this is measuring smaller and looks well. Fortunately there does not appear to be any signs of active infection locally or systemically at this time which is great news. No fevers, chills, nausea, vomiting, or diarrhea. 07-29-2022 upon evaluation today patient appears to be doing well currently in regard to his wound. This is actually measuring smaller although there is a lot of hypergranulation noted. Fortunately there does not appear to be any signs of active infection locally or systemically at this time. No fevers, chills, nausea, vomiting, or diarrhea. 11/9; pressure ulcer on the right buttock in the setting of spina bifida and lower extremity paralysis. He has been using [facility] 09-02-2022 upon evaluation today patient appears to be doing well currently in regard to his wound. We have been using Hydrofera Blue this is showing signs of improvement as far as getting smaller is concerned. There is some drainage around the edges of the wound but I think a lot of this is due to the fact he did not even have a dressing on that he came in today. 12/29; right buttock pressure area which was initially stage IV. This is in the setting of a patient with spina bifida spends most of his time in a wheelchair. I am not certain how much he is offloading this. He has been using for [facility] as a primary dressing. When he was here the last visit he was felt to have hyper granulated tissue and was treated with silver nitrate chemical cauterization 10-14-2022 upon evaluation today patient appears to be doing poorly in regard to the wound in the right ischial location. Unfortunately he tells me that the [place], [place] ([id number]) [id number] Page 3 of 10 dressing seems to be falling off frequently. Unfortunately I think that this  has contributed to him developing an infection which appears to be exemplified by the fact that he has bone exposed which is actually necrotic and working its  way out of the wound up and have to perform a fairly significant debridement today it does appear. I think that he needs to have these dressings ensure that they are in place at all times. Also discussed with the patient that we will get a need to do some testing in order to see if he indeed has an infection. If he does have a bone infection which I think is pretty likely to be osteomyelitis of this ischial location. I discussed that with the patient in detail today and depending on what we see him and make any adjustments in care as far as going forward is concerned. Obviously depressed antibiotic that we gave him that he tells me was somewhat irritating as far as the stomach side effects are concerned. He states he really would not want to be on that again. Either way I feel he is probably going to be placed on something once we get the results of the culture back. I am honestly concerned about the fact that he comes into the clinic without any dressings in place I am not sure how often they are really staying where they need to be as far as this wound is concerned. I actually cannot remember seeing him anytime in the past 2 months or so at least that he came in with a dressing intact. 10-21-2022 upon evaluation today patient presents for initial inspection here in our clinic concerning issues that he has been having with a wound in the sacral area. Again last time he was here I did debride away necrotic bone which was confirmed on pathology although it was not necessarily confirmed that this was secondary to osteomyelitis on the pathology report. Also the x-ray did not neither confirm nor deny the presence of osteomyelitis and his culture unfortunately showed multiple organisms with nothing predominance of this does not help to rule and tailor  down exactly what is going on here. Fortunately I do not see any signs of active infection systemically which is great news. With that being said locally I am definitely seeing some issues here. In fact this is warm to touch around the sacral area and I think that cellulitis is definitely present I just cannot pinpoint the exact organism. With that being said I did discuss with the patient as well today that I do believe that he probably needs an MRI in order to further quantify the extent of her likely osteomyelitis and this again is something that we are going to order this will need to be without contrast due to the fact that he is allergic to contrast dye. 11-18-2022 upon evaluation today patient's wound is actually showing some signs of improvement currently. We have been doing the Dakin's moistened gauze dressing which I think is helping to clean up the wound for seeing some granulation growing and still the tissue is not quite as healthy as I would love to see but better than previous. He does have osteomyelitis but the good news is he seems to be treated well with the antibiotics he does have a refill that was just picked up and I think that he needs to continue to take that for certain. 3/7; patient is on a 6-week clinic hiatus. He lives at home with his sister however she is not at home all the time. He transfers out of bed and a sliding board. He claims he is only in the in the wheelchair about 4 5 hours. He does have underlying osteomyelitis I am not  sure about the status of the antibiotics here. Part of the wound still easily probes to bone 12-23-2022 upon evaluation today patient appears to be doing well currently in regard to his wound all things considered there is still a lot of hypergranulation but I do feel like that with the Vashe moistened gauze is actually doing better than he was previously with the Hydrofera Blue. This just did not seem to be staying in place and with the Vashe  through able to change this more frequently. Readmission: 03-31-2023 this is a gentleman whom I have seen multiple times intermittently throughout the years compliance has been a real issue however. I do feel like there is some significant issues here still with the fact that dressing changes and keeping dressings in place are still pretty much as a significant problem here for [place]. I do not see any signs of active infection at this time which is great news but at the same time the wounds do not worse in fact he has another wound different than what I even noted previous. Subsequently he I do believe that the patient should continue to do monitor for any evidence of infection or worsening. Overall I think he needs to have regular follow-up visits with [place] and I think that he needs to have dressings in place at all times. He tells me that he is at home primarily by himself transferring himself from the first thing in the morning through at least 3 in the afternoon as his sister works he really needs somebody gets a caregiver with him throughout the day. 04-12-2023 upon evaluation today patient appears to be doing a little worse currently in regard to his wounds. He has been tolerating the dressing changes without complication. Fortunately I do not see any evidence of active infection locally nor systemically at this point. 04-21-2023 upon evaluation today patient's wound unfortunately appears to have purulent drainage and also there is a malodorous drainage at this point. Unfortunately I think that there is something going on here that does not seem to be doing nearly as good as what we would like to see. I think that he is likely going to require some antibiotics. He has taken Cipro before without complication. 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 On 7/18 he had purulent drainage he had a PCR culture and is now on Cipro he is tolerating this well. Using Hydrofera  Blue's a 2 bit and border foam his sister is changing the dressing After his sister goes to work the patient is apparently in a wheelchair all day. There is no way to heal the wound like this with that degree of prolonged pressure. 05-12-2023 upon evaluation today patient unfortunately appears to be doing much worse than what we previously seen. Last time he was here he was seen by Dr. Leanord Hawking. This was shortly after I put him on Cipro he was tolerating that well. Nonetheless he did not show up last week apparently there was a transportation issue. This week upon arrival this wound appears to be doing significantly worse there is a significant malodorous drainage and this is deeper he has erythema in the gluteal region around it appears to be infected and to be honest despite the Cipro and oral antibiotics he seems to be doing worse not better. 05-19-2023 upon evaluation patient appears to be doing better than last time I saw him with regard to his wound in the right gluteal  location. Fortunately there does not appear to be any signs of systemic infection though he does have evidence of osteomyelitis noted which were acute on chronic and that was the findings that were noted in the hospital when he was admitted most recently after I sent him from May 12, 2023 through May 17, 2023. The patient was treated with IV Zosyn and bank while in the hospital and discharged on Augmentin although that has not been picked up yet it has been 2 days since he was discharged he states that "the pharmacy never called his sister to let her know it was ready". Nonetheless this needs to be gotten ASAP to make sure that he is fully treated as far as the infection is concerned I discussed that with him today as well. 05-26-2023 upon evaluation today patient appears to be doing better today with regard to his wounds compared to what we have noticed previous. Fortunately I do not see any evidence of active infection locally or  systemically which is great news and in general I do believe that we are making headway towards complete closure. With that being said we still had some trouble getting the order done for the trapeze and aids to help him with regard to his daily activities and preventing the dressings from coming off. We have reached out to his CAP Worker and have not gotten in touch with her as of yet. I did recommend that the patient try to see. He or his sister to get in touch with Angelique and see if they can help [place] in that regard. Electronic Signature(s) Signed: 05/26/2023 3:54:49 PM By: Allen Derry PA-C Entered By: Allen Derry on 05/26/2023 12:54:49 [name] ([id number]) [id number] Page 4 of 10 -------------------------------------------------------------------------------- Physical Exam Details Patient Name: Date of ServiceEulas [name] [facility], [name] 05/26/2023 2:00 PM Medical Record Number: [id number] Patient Account Number: [id number] Date of Birth/Sex: Treating RN: Jan 11, 1966 (57 y.o. [name] Primary Care Provider: Aram Beecham Other Clinician: Referring Provider: Treating Provider/Extender: Hermine Messick Weeks in Treatment: 8 Constitutional Well-nourished and well-hydrated in no acute distress. Respiratory normal breathing without difficulty. Psychiatric this patient is able to make decisions and demonstrates good insight into disease process. Alert and Oriented x 3. pleasant and cooperative. Notes Upon inspection patient's wound bed actually showed signs of good granulation epithelization at this point. Fortunately I think we are showing signs of improvement compared to what we have been previous he actually came in today and his dressing was in place which is good this is actually the first time of actually had him coming with the dressing in place since I saw him back in the clinic. Electronic Signature(s) Signed: 05/26/2023  3:55:08 PM By: Allen Derry PA-C Entered By: Allen Derry on 05/26/2023 12:55:08 -------------------------------------------------------------------------------- Physician Orders Details Patient Name: Date of Service: [name] [facility], [name] 05/26/2023 2:00 PM Medical Record Number: [id number] Patient Account Number: [id number] Date of Birth/Sex: Treating RN: 1965-12-19 (57 y.o. [name] Primary Care Provider: Aram Beecham Other Clinician: Referring Provider: Treating Provider/Extender: Gabriel Earing in Treatment: 8 Verbal / Phone Orders: No Diagnosis Coding ICD-10 Coding Code Description M86.68 Other chronic osteomyelitis, other site L89.314 Pressure ulcer of right buttock, stage 4 Q76.0 Spina bifida occulta Z93.3 Colostomy status I10 Essential (primary) hypertension I25.10 Atherosclerotic heart disease of native coronary artery without angina pectoris [name] ([id number]) [id number] Page 5 of 10 Follow-up Appointments Return Appointment in 1 week. Bathing/ [employer] wounds  with antibacterial soap and water. 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 Use non-bordered dressing only. Wound #6 - Gluteus Wound Laterality: Right, Lateral Cleanser: Byram Ancillary Kit - 15 Day Supply (Generic) 3 x Per Week/30 Days Discharge Instructions: Use supplies as instructed; Kit contains: (15) Saline Bullets; (15) 3x3 Gauze; 15 pr Gloves Prim Dressing: Gauze 3 x Per Week/30 Days ary Discharge Instructions: moistened with Dakins Solution Secondary Dressing: (BORDER) Zetuvit Plus SILICONE BORDER Dressing 4x4 (in/in) (Generic) 3 x Per Week/30 Days Discharge Instructions: Please do not put silicone bordered dressings under wraps. Use non-bordered dressing only. Electronic Signature(s) Signed: 05/26/2023 2:50:56 PM By: Angelina Pih Signed: 05/27/2023 1:57:59 PM By: Allen Derry PA-C Entered By: Angelina Pih on 05/26/2023 11:50:56 -------------------------------------------------------------------------------- Problem List Details Patient Name: Date of Service: [name] TH, [name] 05/26/2023 2:00 PM Medical Record Number: [id number] Patient Account Number: [id number] Date of Birth/Sex: Treating RN: 09-Feb-1966 (57 y.o. 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 03/31/2023 No Yes Z93.3 Colostomy status 03/31/2023 No Yes I10 Essential (primary) hypertension 03/31/2023 No Yes I25.10 Atherosclerotic heart disease of native coronary artery without angina pectoris 03/31/2023 No Yes Inactive Problems Resolved Problems Electronic Signature(s) Signed: 05/26/2023 2:10:07 PM By: Allen Derry  PA-C Entered By: Allen Derry on 05/26/2023 11:10:07 -------------------------------------------------------------------------------- Progress Note Details Patient Name: Date of Service: FA Lacy Duverney TH, [name] 05/26/2023 2:00 PM Medical Record Number: [id number] Patient Account Number: [id number] Date of Birth/Sex: Treating RN: Feb 10, 1966 (57 y.o. [name] Primary Care Provider: Aram Beecham Other Clinician: Referring Provider: Treating Provider/Extender: Gabriel Earing in Treatment: 8 Subjective Chief Complaint Information obtained from Patient Right ischial tuberosity pressure ulcer with chronic osteomyelitis History of Present Illness (HPI) 57 year old male with a history of spina bifida presenting to [place] with a history of a open wound on the left gluteal region near his upper thigh which she's had for several weeks. He was seen in the ED recently at [facility] and was put on doxycycline and was advised some local care. his past medical history is significant for spinal bifid, neurogenic bladder, kidney stones, sacral pressure sores, constipation. Past surgical history significant for partial cystectomy, ileal conduit, VP shunt removal, percutaneous nephro lithotripsy. In the remote past the patient says he's had some treatment for a ulcer on this area and was treated with a skin graft. Readmission: 10/16/2021 upon evaluation today patient appears to be doing somewhat poorly in regard to a fairly large  wound noted over the right ischial tuberosity location. This is a stage IV pressure ulcer. Of note this is also positive for osteomyelitis upon a CT scan which was performed during the time that he was admitted in the hospital on 09/19/2021. With that being said it is noted in the right inferior buttock that he has a decubitus ulcer which extends to the posterior toe inferior right ischium where there is evidence of osteomyelitis. When he was here in  the office actually missed that this was positive I missed read it and thought it said that there was no osteomyelitis. That is not the case there is in fact osteomyelitis noted here. I actually did review his notes as well in epic. Looking to the discharge summary it appears that the patient was admitted from 09/19/2021 through 09/23/2021. He was discharged home with home health care according to the note. With that being said from what I understand his sister actually takes care of him at this point. He was also to follow-up with a primary care provider and here at the wound care center within a week. I am just now seeing him on the 13th almost a month after discharge. He does have a history of again osteomyelitis of this right hip/ischial tuberosity location. 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 The reason for his admission was actually worsening of the decubitus ulcer upon admission. He does have chronic osteomyelitis of this area. He was treated with empiric antibiotics he had acute kidney injury which improved with IV fluids he also had hypokalemia. Surgical debridement was performed by general surgery at that [facility] ([id number]) [id number] Page 7 of 10 time and ID was consulted to assist with management according to the notes. IV antibiotics were recommended but patient declined and wanted oral antibiotics at discharge. Therefore no IV antibiotics were planned for discharge time. This case was under the care of Dr. Joylene Draft while he was in the hospital when she did discharge him with a 2-week course of doxycycline and Augmentin according to the notes. It looks like Dr. Judithann Sheen office did call and leave a message for the patient to get scheduled for a follow-up visit after hospital discharge I do not see  where he showed up in fact it appears that the patient has a no-show letter from Dr. Judithann Sheen office noted in epic due to a appointment that was missed on 10/06/2021. Looking at the pictures from Dr. Cristine Polio notes on 09/23/2021 it does appear that the wound is a little bit better as far as the cleanliness of the surface of the wound today but nonetheless still is a very significant wound. 10/26/2021 upon evaluation today patient appears to be doing okay in regard to his wound. Again this is a wound that he did indeed have osteomyelitis and previous. With that being said I do not see any signs of a worsening infection at this time which is good news. Overall I think that we are headed in the right direction although still I think he needs more aggressive offloading. 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 I do believe this would be ideal for him to be honest. Readmission: 01-15-2022 upon evaluation today patient presents for reevaluation he was last seen on October 26, 2021. At that time unfortunately he was having  issues with a significant ischial pressure ulcer on the right ischial tuberosity. Subsequently he ended up having decent response to the Dakin's moistened gauze dressing that was previously being utilized and subsequently the patient's sister who is his primary caregiver as well states that she decided just to try to manage this at home. Nonetheless unfortunately this is getting a little larger not better. He does spend a lot of the day in his chair. He does have an air mattress according what they tell me today. That was previously ordered. With that being said I do believe that at this time things do appear to be doing much better from the standpoint of infection I do not see any signs of overt infection. There is also no evidence of systemic infection. No fevers, chills, nausea, vomiting, or diarrhea. With that being said I do believe based on what I am seeing  the wound is a bit cleaner and he possibly could be an excellent candidate for a wound VAC. 02-05-2022 upon evaluation today patient appears to be doing about the same in regard to his wound. Unfortunately he is not any better but he also really has not had the wound VAC on sufficiently. There is been some confusion with the orders part of that I think is our follow-up with the way the order was written part of it may be with how the wound VAC is being placed either way we need to see what we can do to try to improve things in this regard. We will clarify the orders today going forward for home health. Unfortunately the patient does have a new wound today which is on the opposite side. This is not good 1 was bad enough have another area to open is definitely not a good thing. Its not as deep but nonetheless is also not very shallow either. We may potentially be able to get this to the point that we could bridge the 2 together in order to wound VAC both but right now want a focus on to try to get the wound VAC going for the main wound initially we will likely use Hydrofera Blue on the new wound.Marland Kitchen 02-19-2022 upon evaluation today patient's wound appears to be doing a little bit worse in the way of maceration fortunately there is no signs of active infection locally or systemically which is great news. No fevers, chills, nausea, vomiting, or diarrhea. 03-05-2022 upon evaluation today patient unfortunately does not appear to be doing nearly as good in regard to his wounds as I would like to see. Fortunately I do not see any evidence of active infection that is obvious although I am can obtain a wound culture to ensure that there is not anything getting worse here as far as the wound without the wound VAC on his concern. With that being said I will go ahead and get this sent in and evaluated will initiate treatment as needed going forward if necessary. With regard to the wound with the wound VAC this continues  to be placed directly over the wound and there is obvious signs of deep tissue injury. The suction being here means that he is sitting on it I think this may be part of the reason why it is coming off although not 100% certain. 03-12-2022 upon evaluation today patient still has not gotten the antibiotics that were called in for him 4 days ago. He tells me that his sister just told him this morning that they were ready but that she  has not had a chance to go pick them up. Nonetheless I feel like that this is really something he needs any should have been taken that not the other antibiotics which were not doing the job for him to be honest. The patient states and I completely agree that there is really no way he would have known relying on his sister to let him know obviously. Nonetheless I do believe that he needs to not be taking any other antibiotics and be on the Levaquin as soon as possible. He tells me that she said she was going to pick them up today. In regard to home health I am hopeful that they actually have been bridging this now. I do think that the wound looks better and this is good news. With that being said I am not certain that I can really tell for sure how this was attached it was removed before he came in and the black foam left in place. I really want him to leave the wound VAC in place until he comes in so that we can monitor and make sure that this is being done correctly he voiced understanding. 03-19-2022 upon evaluation today and much happier with how things appear as far as the patient's wounds are concerned. I think he is on a much better track and they are doing a better job at bridging although its not quite where I like to see it is still more posterior which I think the patient needs to be a little bit more lateral on his side. Either way this is something that I did marked with a small dot today for him to tell the home health nurses well so she can bridge it to  that location also think the tubing should go up instead of going down to just make it less cumbersome overall for him. 04-19-2022 upon evaluation today patient appears to be doing a little better in regard to his wounds. Fortunately I do not see any signs of infection I do believe he is on the right track. The wound VAC does seem to be helping to clean the wound up a bit and bring it in from the base at work. This is good news. 05-10-2022 upon evaluation today patient appears to be doing well currently in regard to his wounds in general in fact of the smaller of the 2 wounds without the wound VAC actually appears to be doing so much better this is almost completely closed and very pleased in that regard. He has been staying off of this he tells me and shows. With that being said the wound with the wound VAC is going require some sharp debridement there is some tissue which is not quite as healthy and we are going to go ahead and continue that for probably 2 more weeks although I think we may be closer to discontinuing this as well since it has healed and quite significantly. 05-24-2022 upon evaluation today patient appears to be doing well with regard to his wound in fact this looks better than I have been expected. This actually I think is a good time to go ahead and switch away from the wound VAC based on what I am seeing. I think we go to [facility] with a bordered foam dressing. 06-21-2022 upon evaluation today patient appears to be doing well currently in regard to his wound this is measuring smaller and looks well. Fortunately there does not appear to be any signs of active infection locally  or systemically at this time which is great news. No fevers, chills, nausea, vomiting, or diarrhea. 07-29-2022 upon evaluation today patient appears to be doing well currently in regard to his wound. This is actually measuring smaller although there is a lot of hypergranulation noted. Fortunately there does  not appear to be any signs of active infection locally or systemically at this time. No fevers, chills, nausea, vomiting, or diarrhea. 11/9; pressure ulcer on the right buttock in the setting of spina bifida and lower extremity paralysis. He has been using [facility] 09-02-2022 upon evaluation today patient appears to be doing well currently in regard to his wound. We have been using Hydrofera Blue this is showing signs of improvement as far as getting smaller is concerned. There is some drainage around the edges of the wound but I think a lot of this is due to the fact he did not even have a dressing on that he came in today. 12/29; right buttock pressure area which was initially stage IV. This is in the setting of a patient with spina bifida spends most of his time in a wheelchair. I am not certain how much he is offloading this. He has been using for [facility] as a primary dressing. When he was here the last visit he was felt to have hyper granulated tissue and was treated with silver nitrate chemical cauterization 10-14-2022 upon evaluation today patient appears to be doing poorly in regard to the wound in the right ischial location. Unfortunately he tells me that the dressing seems to be falling off frequently. Unfortunately I think that this has contributed to him developing an infection which appears to be exemplified by the fact that he has bone exposed which is actually necrotic and working its way out of the wound up and have to perform a fairly significant debridement today it does appear. I think that he needs to have these dressings ensure that they are in place at all times. Also discussed with the patient that we will get a need to do some testing in order to see if he indeed has an infection. If he does have a bone infection which I think is pretty likely to be osteomyelitis of this Chase, [name] ([id number]) [id number] Page 8 of 10 ischial  location. I discussed that with the patient in detail today and depending on what we see him and make any adjustments in care as far as going forward is concerned. Obviously depressed antibiotic that we gave him that he tells me was somewhat irritating as far as the stomach side effects are concerned. He states he really would not want to be on that again. Either way I feel he is probably going to be placed on something once we get the results of the culture back. I am honestly concerned about the fact that he comes into the clinic without any dressings in place I am not sure how often they are really staying where they need to be as far as this wound is concerned. I actually cannot remember seeing him anytime in the past 2 months or so at least that he came in with a dressing intact. 10-21-2022 upon evaluation today patient presents for initial inspection here in our clinic concerning issues that he has been having with a wound in the sacral area. Again last time he was here I did debride away necrotic bone which was confirmed on pathology although it was not necessarily confirmed that this was secondary to osteomyelitis on the  pathology report. Also the x-ray did not neither confirm nor deny the presence of osteomyelitis and his culture unfortunately showed multiple organisms with nothing predominance of this does not help to rule and tailor down exactly what is going on here. Fortunately I do not see any signs of active infection systemically which is great news. With that being said locally I am definitely seeing some issues here. In fact this is warm to touch around the sacral area and I think that cellulitis is definitely present I just cannot pinpoint the exact organism. With that being said I did discuss with the patient as well today that I do believe that he probably needs an MRI in order to further quantify the extent of her likely osteomyelitis and this again is something that we are going to  order this will need to be without contrast due to the fact that he is allergic to contrast dye. 11-18-2022 upon evaluation today patient's wound is actually showing some signs of improvement currently. We have been doing the Dakin's moistened gauze dressing which I think is helping to clean up the wound for seeing some granulation growing and still the tissue is not quite as healthy as I would love to see but better than previous. He does have osteomyelitis but the good news is he seems to be treated well with the antibiotics he does have a refill that was just picked up and I think that he needs to continue to take that for certain. 3/7; patient is on a 6-week clinic hiatus. He lives at home with his sister however she is not at home all the time. He transfers out of bed and a sliding board. He claims he is only in the in the wheelchair about 4 5 hours. He does have underlying osteomyelitis I am not sure about the status of the antibiotics here. Part of the wound still easily probes to bone 12-23-2022 upon evaluation today patient appears to be doing well currently in regard to his wound all things considered there is still a lot of hypergranulation but I do feel like that with the Vashe moistened gauze is actually doing better than he was previously with the Hydrofera Blue. This just did not seem to be staying in place and with the Vashe through able to change this more frequently. Readmission: 03-31-2023 this is a gentleman whom I have seen multiple times intermittently throughout the years compliance has been a real issue however. I do feel like there is some significant issues here still with the fact that dressing changes and keeping dressings in place are still pretty much as a significant problem here for [place]. I do not see any signs of active infection at this time which is great news but at the same time the wounds do not worse in fact he has another wound different than what I even noted  previous. Subsequently he I do believe that the patient should continue to do monitor for any evidence of infection or worsening. Overall I think he needs to have regular follow-up visits with [place] and I think that he needs to have dressings in place at all times. He tells me that he is at home primarily by himself transferring himself from the first thing in the morning through at least 3 in the afternoon as his sister works he really needs somebody gets a caregiver with him throughout the day. 04-12-2023 upon evaluation today patient appears to be doing a little worse currently in regard to his  wounds. He has been tolerating the dressing changes without complication. Fortunately I do not see any evidence of active infection locally nor systemically at this point. 04-21-2023 upon evaluation today patient's wound unfortunately appears to have purulent drainage and also there is a malodorous drainage at this point. Unfortunately I think that there is something going on here that does not seem to be doing nearly as good as what we would like to see. I think that he is likely going to require some antibiotics. He has taken Cipro before without complication. 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 On 7/18 he had purulent drainage he had a PCR culture and is now on Cipro he is tolerating this well. Using Hydrofera Blue's a 2 bit and border foam his sister is changing the dressing After his sister goes to work the patient is apparently in a wheelchair all day. There is no way to heal the wound like this with that degree of prolonged pressure. 05-12-2023 upon evaluation today patient unfortunately appears to be doing much worse than what we previously seen. Last time he was here he was seen by Dr. Leanord Hawking. This was shortly after I put him on Cipro he was tolerating that well. Nonetheless he did not show up last week apparently there was a transportation issue. This week upon  arrival this wound appears to be doing significantly worse there is a significant malodorous drainage and this is deeper he has erythema in the gluteal region around it appears to be infected and to be honest despite the Cipro and oral antibiotics he seems to be doing worse not better. 05-19-2023 upon evaluation patient appears to be doing better than last time I saw him with regard to his wound in the right gluteal location. Fortunately there does not appear to be any signs of systemic infection though he does have evidence of osteomyelitis noted which were acute on chronic and that was the findings that were noted in the hospital when he was admitted most recently after I sent him from May 12, 2023 through May 17, 2023. The patient was treated with IV Zosyn and bank while in the hospital and discharged on Augmentin although that has not been picked up yet it has been 2 days since he was discharged he states that "the pharmacy never called his sister to let her know it was ready". Nonetheless this needs to be gotten ASAP to make sure that he is fully treated as far as the infection is concerned I discussed that with him today as well. 05-26-2023 upon evaluation today patient appears to be doing better today with regard to his wounds compared to what we have noticed previous. Fortunately I do not see any evidence of active infection locally or systemically which is great news and in general I do believe that we are making headway towards complete closure. With that being said we still had some trouble getting the order done for the trapeze and aids to help him with regard to his daily activities and preventing the dressings from coming off. We have reached out to his CAP Worker and have not gotten in touch with her as of yet. I did recommend that the patient try to see. He or his sister to get in touch with Angelique and see if they can help [place] in that  regard. Objective Constitutional Well-nourished and well-hydrated in no acute distress. Vitals Time Taken: 2:01 PM, Height: 59 in,  Weight: 90 lbs, BMI: 18.2, Temperature: 97.6 F, Pulse: 117 bpm, Respiratory Rate: 18 breaths/min, Blood Pressure: 105/67 mmHg. Respiratory normal breathing without difficulty. Psychiatric [name], [name] ([id number]) [id number] Page 9 of 10 this patient is able to make decisions and demonstrates good insight into disease process. Alert and Oriented x 3. pleasant and cooperative. General Notes: Upon inspection patient's wound bed actually showed signs of good granulation epithelization at this point. Fortunately I think we are showing signs of improvement compared to what we have been previous he actually came in today and his dressing was in place which is good this is actually the first time of actually had him coming with the dressing in place since I saw him back in the clinic. Integumentary (Hair, Skin) Wound #5 status is Open. Original cause of wound was Gradually Appeared. The date acquired was: 10/04/2021. The wound has been in treatment 8 weeks. The wound is located on the Right,Midline Gluteus. The wound measures 4cm length x 5cm width x 2.1cm depth; 15.708cm^2 area and 32.987cm^3 volume. There is bone, muscle, and Fat Layer (Subcutaneous Tissue) exposed. There is no tunneling or undermining noted. There is a large amount of serosanguineous drainage noted. There is medium (34-66%) red, pink granulation within the wound bed. There is a medium (34-66%) amount of necrotic tissue within the wound bed including Adherent Slough. Wound #6 status is Open. Original cause of wound was Gradually Appeared. The date acquired was: 10/04/2021. The wound has been in treatment 8 weeks. The wound is located on the Right,Lateral Gluteus. The wound measures 3cm length x 2cm width x 1.1cm depth; 4.712cm^2 area and 5.184cm^3 volume. There is Fat Layer  (Subcutaneous Tissue) exposed. There is no tunneling or undermining noted. There is a medium amount of serosanguineous drainage noted. There is large (67-100%) red, pink granulation within the wound bed. There is a small (1-33%) amount of necrotic tissue within the wound bed including Adherent Slough. Assessment Active Problems ICD-10 Other chronic osteomyelitis, other site Pressure ulcer of right buttock, stage 4 Spina bifida occulta Colostomy status Essential (primary) hypertension Atherosclerotic heart disease of native coronary artery without angina pectoris Plan Follow-up Appointments: Return Appointment in 1 week. Bathing/ Shower/ Hygiene: Wash wounds with antibacterial soap and water. 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 Use non-bordered dressing only. WOUND #6: - Gluteus Wound Laterality: Right, Lateral Cleanser: Byram Ancillary Kit - 15 Day Supply (Generic) 3 x Per Week/30 Days Discharge Instructions: Use supplies as instructed; Kit contains: (15) Saline Bullets; (15) 3x3 Gauze; 15 pr Gloves Prim Dressing: Gauze 3 x Per Week/30 Days ary Discharge Instructions: moistened with Dakins Solution Secondary Dressing:  (BORDER) Zetuvit Plus SILICONE BORDER Dressing 4x4 (in/in) (Generic) 3 x Per Week/30 Days Discharge Instructions: Please do not put silicone bordered dressings under wraps. Use non-bordered dressing only. 1. I am  going to recommend based on what we are seeing that we have the patient continue to monitor for any signs of worsening. He did get the antibiotic picked up as was recommended for him upon discharge from the hospital that was Augmentin. 2. I am going to recommend as well that the patient should continue to offload is much as possible. This is still of utmost importance. 3. I am also going to suggest that they get in touch with the CAP Worker to see if she can aid in getting the trapeze bar and helping out in that regard with the overall treatment plan to try to keep his dressings in place better without him having to slide around so much. We will see patient back for reevaluation in 1 week here in the clinic. If anything worsens or changes patient will contact our office for additional recommendations. Electronic Signature(s) Signed: 05/26/2023 3:58:39 PM By: Allen Derry PA-C Entered By: Allen Derry on 05/26/2023 12:58:39 [name] ([id number]) [id number] Page 10 of 10 -------------------------------------------------------------------------------- SuperBill Details Patient Name: Date of ServiceEulas [name] [facility], [name] 05/26/2023 Medical Record Number: [id number] Patient Account Number: [id number] Date of Birth/Sex: Treating RN: November 23, 1965 (57 y.o. [name] Primary Care Provider: Aram Beecham Other Clinician: Referring Provider: Treating Provider/Extender: Hermine Messick Weeks in Treatment: 8 Diagnosis Coding ICD-10 Codes Code Description [phone] Other chronic osteomyelitis, other site L89.314 Pressure ulcer of right buttock, stage 4 Q76.0 Spina bifida occulta Z93.3 Colostomy status I10 Essential (primary)  hypertension I25.10 Atherosclerotic heart disease of native coronary artery without angina pectoris Facility Procedures : CPT4 Code: [id number] Description: 99214 - WOUND CARE VISIT-LEV 4 EST PT Modifier: Quantity: 1 Physician Procedures : CPT4 Code Description Modifier [id number] - WC PHYS LEVEL 3 - EST PT ICD-10 Diagnosis Description M86.68 Other chronic osteomyelitis, other site L89.314 Pressure ulcer of right buttock, stage 4 Q76.0 Spina bifida occulta Z93.3 Colostomy status Quantity: 1 Electronic Signature(s) Signed: 05/26/2023 4:00:07 PM By: Allen Derry PA-C Previous Signature: 05/26/2023 2:53:58 PM Version By: Angelina Pih Entered By: Allen Derry on 05/26/2023 13:00:06

## 2023-06-02 ENCOUNTER — Encounter: Payer: 59 | Admitting: Internal Medicine

## 2023-06-02 DIAGNOSIS — L89314 Pressure ulcer of right buttock, stage 4: Secondary | ICD-10-CM | POA: Diagnosis not present

## 2023-06-02 NOTE — Progress Notes (Addendum)
[place], [name] ([id number]) [id number] Page 1 of 10 Visit Report for 06/02/2023 Arrival Information Details Patient Name: Date of Service: [name] [facility], DO [place] LD 06/02/2023 1:30 PM Medical Record Number: [id number] Patient Account Number: [id number] Date of Birth/Sex: Treating RN: 1965-12-01 (57 y.o. [name]) [name] Primary Care Zein Helbing: Aram Beecham Other Clinician: Referring Avner Stroder: Treating Leelyn Jasinski/Extender: RO BSO N, MICHA EL Cathlean Sauer in Treatment: 9 Visit Information History Since Last Visit Added or deleted any medications: No Patient Arrived: Wheel Chair Any new allergies or adverse reactions: No Arrival Time: 13:27 Had a fall or experienced change in No Accompanied By: self activities of daily living that may affect Transfer Assistance: None risk of falls: Patient Identification Verified: Yes Signs or symptoms of abuse/neglect since last visito No Secondary Verification Process Completed: Yes Hospitalized since last visit: No Patient Requires Transmission-Based Precautions: No Implantable device outside of the clinic excluding No Patient Has Alerts: No cellular tissue based products placed in the center since last visit: Has Dressing in Place as Prescribed: Yes Pain Present Now: No Electronic Signature(s) Signed: 06/02/2023 1:36:18 PM By: [name] RN Entered By: [name] on 06/02/2023 10:36:18 -------------------------------------------------------------------------------- Clinic Level of Care Assessment Details Patient Name: Date of ServiceEulas [name] [facility], [name] 06/02/2023 1:30 PM Medical Record Number: [id number] Patient Account Number: [id number] Date of Birth/Sex: Treating RN: [birth year] (56 y.o. [name]) [name] Primary Care Kaelan Amble: Aram Beecham Other Clinician: Referring Ophia Shamoon: Treating Shantasia Hunnell/Extender: RO BSO N, MICHA EL Cathlean Sauer in Treatment: 9 Clinic Level of Care Assessment  Items TOOL 4 Quantity Score X- 1 0 Use when only an EandM is performed on FOLLOW-UP visit ASSESSMENTS - Nursing Assessment / Reassessment X- 1 10 Reassessment of Co-morbidities (includes updates in patient status) X- 1 5 Reassessment of Adherence to Treatment Plan [place], [name] ([id number]) [id number] Page 2 of 10 ASSESSMENTS - Wound and Skin A ssessment / Reassessment []  - Simple Wound Assessment / Reassessment - one wound 0 X- 2 5 Complex Wound Assessment / Reassessment - multiple wounds []  - 0 Dermatologic / Skin Assessment (not related to wound area) ASSESSMENTS - Focused Assessment []  - 0 Circumferential Edema Measurements - multi extremities []  - 0 Nutritional Assessment / Counseling / Intervention []  - 0 Lower Extremity Assessment (monofilament, tuning fork, pulses) []  - 0 Peripheral Arterial Disease Assessment (using hand held doppler) ASSESSMENTS - Ostomy and/or Continence Assessment and Care []  - 0 Incontinence Assessment and Management []  - 0 Ostomy Care Assessment and Management (repouching, etc.) PROCESS - Coordination of Care []  - 0 Simple Patient / Family Education for ongoing care []  - 0 Complex (extensive) Patient / Family Education for ongoing care []  - 0 Staff obtains Chiropractor, Records, T Results / Process Orders est []  - 0 Staff telephones HHA, Nursing Homes / Clarify orders / etc []  - 0 Routine Transfer to another Facility (non-emergent condition) []  - 0 Routine Hospital Admission (non-emergent condition) []  - 0 New Admissions / Manufacturing engineer / Ordering NPWT Apligraf, etc. , []  - 0 Emergency Hospital Admission (emergent condition) X- 1 10 Simple Discharge Coordination []  - 0 Complex (extensive) Discharge Coordination PROCESS - Special Needs []  - 0 Pediatric / Minor Patient Management []  - 0 Isolation Patient Management []  - 0 Hearing / Language / Visual special needs []  - 0 Assessment of  Community assistance (transportation, D/C planning, etc.) []  - 0 Additional assistance / Altered mentation []  - 0 Support Surface(s) Assessment (bed, cushion, seat, etc.) INTERVENTIONS - Wound  Cleansing / Measurement []  - 0 Simple Wound Cleansing - one wound X- 2 5 Complex Wound Cleansing - multiple wounds X- 1 5 Wound Imaging (photographs - any number of wounds) []  - 0 Wound Tracing (instead of photographs) []  - 0 Simple Wound Measurement - one wound X- 2 5 Complex Wound Measurement - multiple wounds INTERVENTIONS - Wound Dressings X - Small Wound Dressing one or multiple wounds 2 10 []  - 0 Medium Wound Dressing one or multiple wounds []  - 0 Large Wound Dressing one or multiple wounds []  - 0 Application of Medications - topical []  - 0 Application of Medications - injection INTERVENTIONS - Miscellaneous []  - 0 External ear exam PAPA, METTLER ([id number]) [id number] Page 3 of 10 []  - 0 Specimen Collection (cultures, biopsies, blood, body fluids, etc.) []  - 0 Specimen(s) / Culture(s) sent or taken to Lab for analysis []  - 0 Patient Transfer (multiple staff / Michiel Sites Lift / Similar devices) []  - 0 Simple Staple / Suture removal (25 or less) []  - 0 Complex Staple / Suture removal (26 or more) []  - 0 Hypo / Hyperglycemic Management (close monitor of Blood Glucose) []  - 0 Ankle / Brachial Index (ABI) - do not check if billed separately X- 1 5 Vital Signs Has the patient been seen at the hospital within the last three years: Yes Total Score: 85 Level Of Care: New/Established - Level 3 Electronic Signature(s) Signed: 06/03/2023 11:58:03 AM By: [name] RN Entered By: [name] on 06/02/2023 11:17:04 -------------------------------------------------------------------------------- Encounter Discharge Information Details Patient Name: Date of Service: [name] TH, [name] 06/02/2023 1:30 PM Medical Record Number: [id number] Patient  Account Number: [id number] Date of Birth/Sex: Treating RN: 28-Feb-1966 (56 y.o. [name] Primary Care Ahmad Vanwey: Aram Beecham Other Clinician: Referring Monia Timmers: Treating Nadya Hopwood/Extender: RO BSO N, MICHA EL Cathlean Sauer in Treatment: 9 Encounter Discharge Information Items Discharge Condition: Stable Ambulatory Status: Wheelchair Discharge Destination: Home Transportation: Private Auto Accompanied By: self Schedule Follow-up Appointment: Yes Clinical Summary of Care: Electronic Signature(s) Signed: 06/02/2023 2:18:42 PM By: [name] RN Entered By: [name] on 06/02/2023 11:18:42 -------------------------------------------------------------------------------- Lower Extremity Assessment Details Patient Name: Date of ServiceEulas [name] [facility], [name] 06/02/2023 1:30 PM [place], [name] ([id number]) [id number] Page 4 of 10 Medical Record Number: [id number] Patient Account Number: [id number] Date of Birth/Sex: Treating RN: [birth year] (56 y.o. [name]) [name] Primary Care [place] Steidle: Aram Beecham Other Clinician: Referring Melanye Hiraldo: Treating Kathline Banbury/Extender: RO BSO Dorris Carnes, MICHA EL Cathlean Sauer in Treatment: 9 Electronic Signature(s) Signed: 06/02/2023 1:38:31 PM By: [name] RN Entered By: [name] on 06/02/2023 10:38:31 -------------------------------------------------------------------------------- Multi Wound Chart Details Patient Name: Date of Service: [name] TH, [name] 06/02/2023 1:30 PM Medical Record Number: [id number] Patient Account Number: [id number] Date of Birth/Sex: Treating RN: [birth year] (56 y.o. [name]) [name] Primary Care Jentri Aye: Aram Beecham Other Clinician: Referring Sham Alviar: Treating Cregg Jutte/Extender: RO BSO N, MICHA EL Cathlean Sauer in Treatment: 9 Vital Signs Height(in): 59 Pulse(bpm): 107 Weight(lbs): 90 Blood Pressure(mmHg): 106/59 Body Mass Index(BMI):  18.2 Temperature(F): 97.7 Respiratory Rate(breaths/min): 18 [5:Photos:] [N/A:N/A] Right, Midline Gluteus Right, Lateral Gluteus N/A Wound Location: Gradually Appeared Gradually Appeared N/A Wounding Event: Pressure Ulcer Pressure Ulcer N/A Primary Etiology: Cataracts, Glaucoma, Coronary Artery Cataracts, Glaucoma, Coronary Artery N/A Comorbid History: Disease, Hypertension, History of Disease, Hypertension, History of pressure wounds, Osteoarthritis, pressure wounds, Osteoarthritis, Paraplegia Paraplegia 10/04/2021 10/04/2021 N/A Date Acquired: 9 9 N/A Weeks of Treatment: Open Open N/A Wound Status:  No No N/A Wound Recurrence: 6.7x2.2x2.4 4.2x1.3x1.2 N/A Measurements L x W x D (cm) 11.577 4.288 N/A A (cm) : rea 27.784 5.146 N/A Volume (cm) : -17.90% -114.10% N/A % Reduction in A rea: -88.70% -1183.30% N/A % Reduction in Volume: Category/Stage IV Category/Stage IV N/A Classification: Large Medium N/A Exudate A mount: Serosanguineous Serosanguineous N/A Exudate Type: red, brown red, brown N/A Exudate Color: Medium (34-66%) Large (67-100%) N/A Granulation A mount: Red, Pink Red, Pink N/A Granulation Quality: Medium (34-66%) Small (1-33%) N/A Necrotic A mount: Fat Layer (Subcutaneous Tissue): Yes Fat Layer (Subcutaneous Tissue): Yes N/A Exposed Structures: Muscle: Yes Fascia: No Bone: Yes Tendon: No [name] ([id number]) [id number] Page 5 of 10 Fascia: No Muscle: No Tendon: No Joint: No Joint: No Bone: No None None N/A Epithelialization: Treatment Notes Electronic Signature(s) Signed: 06/02/2023 1:38:37 PM By: [name] RN Entered By: [name] on 06/02/2023 10:38:37 -------------------------------------------------------------------------------- Multi-Disciplinary Care Plan Details Patient Name: Date of Service: [name] TH, [name] 06/02/2023 1:30 PM Medical Record Number: [id number] Patient Account Number:  [id number] Date of Birth/Sex: Treating RN: 01-Dec-1965 (56 y.o. [name]) [name] Primary Care [place] Korver: Aram Beecham Other Clinician: Referring Merville Hijazi: Treating Umaiza Matusik/Extender: RO BSO N, MICHA EL Cathlean Sauer in Treatment: 9 Active Inactive Pressure Nursing Diagnoses: Potential for impaired tissue integrity related to pressure, friction, moisture, and shear Goals: Patient will remain free from development of additional pressure ulcers Date Initiated: 03/31/2023 Target Resolution Date: 07/01/2023 Goal Status: Active Interventions: Assess: immobility, friction, shearing, incontinence upon admission and as needed Assess offloading mechanisms upon admission and as needed Assess potential for pressure ulcer upon admission and as needed Notes: Wound/Skin Impairment Nursing Diagnoses: Knowledge deficit related to ulceration/compromised skin integrity Goals: Patient/caregiver will verbalize understanding of skin care regimen Date Initiated: 03/31/2023 Target Resolution Date: 07/01/2023 Goal Status: Active Ulcer/skin breakdown will have a volume reduction of 30% by week 4 Date Initiated: 03/31/2023 Date Inactivated: 06/02/2023 Target Resolution Date: 05/31/2023 Goal Status: Unmet Unmet Reason: comorbidities Ulcer/skin breakdown will have a volume reduction of 50% by week 8 Date Initiated: 03/31/2023 Target Resolution Date: 07/01/2023 Goal Status: Active Ulcer/skin breakdown will have a volume reduction of 80% by week 12 Date Initiated: 03/31/2023 Target Resolution Date: 07/31/2023 Goal Status: Active Ulcer/skin breakdown will heal within 14 weeks Date Initiated: 03/31/2023 Target Resolution Date: 08/31/2023 SERGEI, BORTNER ([id number]) [phone].pdf Page 6 of 10 Goal Status: Active Interventions: Assess patient/caregiver ability to obtain necessary supplies Assess patient/caregiver ability to perform ulcer/skin care regimen upon admission and as  needed Assess ulceration(s) every visit Notes: Electronic Signature(s) Signed: 06/02/2023 1:39:07 PM By: [name] RN Entered By: [name] on 06/02/2023 10:39:07 -------------------------------------------------------------------------------- Pain Assessment Details Patient Name: Date of ServiceEulas [name] [facility], [name] 06/02/2023 1:30 PM Medical Record Number: [id number] Patient Account Number: [id number] Date of Birth/Sex: Treating RN: 02-Jul-1966 (56 y.o. [name] Primary Care Aedan Geimer: Aram Beecham Other Clinician: Referring Denicia Pagliarulo: Treating Asani Mcburney/Extender: RO BSO N, MICHA EL Cathlean Sauer in Treatment: 9 Active Problems Location of Pain Severity and Description of Pain Patient Has Paino No Site Locations Pain Management and Medication Current Pain Management: Electronic Signature(s) Signed: 06/02/2023 1:36:44 PM By: [name] RN Entered By: [name] on 06/02/2023 10:36:44 [name] ([id number]) [id number] Page 7 of 10 -------------------------------------------------------------------------------- Patient/Caregiver Education Details Patient Name: Date of Service: [name] [facility], DO [place] LD 8/29/2024andnbsp1:30 PM Medical Record Number: [id number] Patient Account Number: [id number] Date of Birth/Gender: Treating RN: 1966/01/11 (57 y.o. [name]) [name] Primary  Care Physician: Aram Beecham Other Clinician: Referring Physician: Treating Physician/Extender: RO BSO Dorris Carnes, MICHA EL Cathlean Sauer in Treatment: 9 Education Assessment Education Provided To: Patient Education Topics Provided Pressure: Handouts: Pressure Injury: Prevention and Offloading Methods: Explain/Verbal Responses: State content correctly Electronic Signature(s) Signed: 06/03/2023 11:58:03 AM By: [name] RN Entered By: [name] on 06/02/2023  10:39:22 -------------------------------------------------------------------------------- Wound Assessment Details Patient Name: Date of Service: [name] TH, [name] 06/02/2023 1:30 PM Medical Record Number: [id number] Patient Account Number: [id number] Date of Birth/Sex: Treating RN: [birth year] (56 y.o. [name] Primary Care Safa Derner: Aram Beecham Other Clinician: Referring Darshawn Boateng: Treating Kymoni Monday/Extender: RO BSO N, MICHA EL Cathlean Sauer in Treatment: 9 Wound Status Wound Number: 5 Primary Pressure Ulcer Etiology: Wound Location: Right, Midline Gluteus Wound Open Wounding Event: Gradually Appeared Status: Date Acquired: 10/04/2021 Comorbid Cataracts, Glaucoma, Coronary Artery Disease, Hypertension, Weeks Of Treatment: 9 History: History of pressure wounds, Osteoarthritis, Paraplegia Clustered Wound: No Photos [place], [name] ([id number]) [id number] Page 8 of 10 Wound Measurements Length: (cm) 6.7 Width: (cm) 2.2 Depth: (cm) 2.4 Area: (cm) 11.577 Volume: (cm) 27.784 % Reduction in Area: -17.9% % Reduction in Volume: -88.7% Epithelialization: None Tunneling: No Undermining: No Wound Description Classification: Category/Stage IV Exudate Amount: Large Exudate Type: Serosanguineous Exudate Color: red, brown Foul Odor After Cleansing: No Slough/Fibrino Yes Wound Bed Granulation Amount: Medium (34-66%) Exposed Structure Granulation Quality: Red, Pink Fascia Exposed: No Necrotic Amount: Medium (34-66%) Fat Layer (Subcutaneous Tissue) Exposed: Yes Necrotic Quality: Adherent Slough Tendon Exposed: No Muscle Exposed: Yes Necrosis of Muscle: No Joint Exposed: No Bone Exposed: Yes Treatment Notes Wound #5 (Gluteus) Wound Laterality: Right, Midline Cleanser Byram Ancillary Kit - 15 Day Supply Discharge Instruction: Use supplies as instructed; Kit contains: (15) Saline Bullets; (15) 3x3 Gauze; 15 pr Gloves Peri-Wound  Care Topical Primary Dressing Gauze Discharge Instruction: moistened with Dakins Solution Secondary Dressing (BORDER) Zetuvit Plus SILICONE BORDER Dressing 4x4 (in/in) Discharge Instruction: Please do not put silicone bordered dressings under wraps. Use non-bordered dressing only. Secured With Compression Wrap Compression Stockings Facilities manager) Signed: 06/02/2023 1:38:03 PM By: [name] RN Entered By: [name] on 06/02/2023 10:38:03 [name] ([id number]) [id number] Page 9 of 10 -------------------------------------------------------------------------------- Wound Assessment Details Patient Name: Date of Service: [name] [facility], [name] 06/02/2023 1:30 PM Medical Record Number: [id number] Patient Account Number: [id number] Date of Birth/Sex: Treating RN: [birth year] (56 y.o. [name]) [name] Primary Care Domini Vandehei: Aram Beecham Other Clinician: Referring Markesia Crilly: Treating Djuan Talton/Extender: RO BSO N, MICHA EL Charna Archer, Jeffrey Weeks in Treatment: 9 Wound Status Wound Number: 6 Primary Pressure Ulcer Etiology: Wound Location: Right, Lateral Gluteus Wound Open Wounding Event: Gradually Appeared Status: Date Acquired: 10/04/2021 Comorbid Cataracts, Glaucoma, Coronary Artery Disease, Hypertension, Weeks Of Treatment: 9 History: History of pressure wounds, Osteoarthritis, Paraplegia Clustered Wound: No Photos Wound Measurements Length: (cm) 4.2 Width: (cm) 1.3 Depth: (cm) 1.2 Area: (cm) 4.288 Volume: (cm) 5.146 % Reduction in Area: -114.1% % Reduction in Volume: -1183.3% Epithelialization: None Tunneling: No Undermining: No Wound Description Classification: Category/Stage IV Exudate Amount: Medium Exudate Type: Serosanguineous Exudate Color: red, brown Foul Odor After Cleansing: No Slough/Fibrino Yes Wound Bed Granulation Amount: Large (67-100%) Exposed Structure Granulation Quality: Red, Pink Fascia  Exposed: No Necrotic Amount: Small (1-33%) Fat Layer (Subcutaneous Tissue) Exposed: Yes Necrotic Quality: Adherent Slough Tendon Exposed: No Muscle Exposed: No Joint Exposed: No Bone Exposed: No Treatment Notes Wound #6 (Gluteus) Wound Laterality: Right, Lateral Cleanser Byram Ancillary Kit - 15 Day Supply  Discharge Instruction: Use supplies as instructed; Kit contains: (15) Saline Bullets; (15) 3x3 Gauze; 15 pr Gloves Peri-Wound Care [place], [place] ([id number]) [id number] Page 10 of 10 Topical Primary Dressing Gauze Discharge Instruction: moistened with Dakins Solution Secondary Dressing (BORDER) Zetuvit Plus SILICONE BORDER Dressing 4x4 (in/in) Discharge Instruction: Please do not put silicone bordered dressings under wraps. Use non-bordered dressing only. Secured With Compression Wrap Compression Stockings Facilities manager) Signed: 06/02/2023 1:38:21 PM By: [name] RN Entered By: [name] on 06/02/2023 10:38:21 -------------------------------------------------------------------------------- Vitals Details Patient Name: Date of Service: [name] TH, [name] 06/02/2023 1:30 PM Medical Record Number: [id number] Patient Account Number: [id number] Date of Birth/Sex: Treating RN: 04-24-1966 (56 y.o. [name]) [name] Primary Care Danitza Schoenfeldt: Aram Beecham Other Clinician: Referring Brenlee Koskela: Treating Oday Ridings/Extender: RO BSO N, MICHA EL Cathlean Sauer in Treatment: 9 Vital Signs Time Taken: 13:30 Temperature (F): 97.7 Height (in): 59 Pulse (bpm): 107 Weight (lbs): 90 Respiratory Rate (breaths/min): 18 Body Mass Index (BMI): 18.2 Blood Pressure (mmHg): 106/59 Reference Range: 80 - 120 mg / dl Electronic Signature(s) Signed: 06/02/2023 1:36:37 PM By: [name] RN Entered By: [name] on 06/02/2023 10:36:37

## 2023-06-03 NOTE — Progress Notes (Signed)
[name] ([id number]) [id number] Page 1 of 10 Visit Report for 06/02/2023 HPI Details Patient Name: Date of Service: Chase Post [facility], [name] 06/02/2023 1:30 PM Medical Record Number: [id number] Patient Account Number: [id number] Date of Birth/Sex: Treating RN: 09/25/1966 (57 y.o. [name]) [name] Primary Care Provider: Aram Beecham Other Clinician: Referring Provider: Treating Provider/Extender: RO BSO N, MICHA EL Cathlean Sauer in Treatment: 9 History of Present Illness HPI Description: 57 year old male with a history of spina bifida presenting to [place] with a history of a open wound on the left gluteal region near his upper thigh which she's had for several weeks. He was seen in the ED recently at [facility] and was put on doxycycline and was advised some local care. his past medical history is significant for spinal bifid, neurogenic bladder, kidney stones, sacral pressure sores, constipation. Past surgical history significant for partial cystectomy, ileal conduit, VP shunt removal, percutaneous nephro lithotripsy. In the remote past the patient says he's had some treatment for a ulcer on this area and was treated with a skin graft. Readmission: 10/16/2021 upon evaluation today patient appears to be doing somewhat poorly in regard to a fairly large wound noted over the right ischial tuberosity location. This is a stage IV pressure ulcer. Of note this is also positive for osteomyelitis upon a CT scan which was performed during the time that he was admitted in the hospital on 09/19/2021. With that being said it is noted in the right inferior buttock that he has a decubitus ulcer which extends to the posterior toe inferior right ischium where there is evidence of osteomyelitis. When he was here in the office actually missed that this was positive I missed read it and thought it said that there was no osteomyelitis. That is not the case there  is in fact osteomyelitis noted here. I actually did review his notes as well in epic. Looking to the discharge summary it appears that the patient was admitted from 09/19/2021 through 09/23/2021. He was discharged home with home health care according to the note. With that being said from what I understand his sister actually takes care of him at this point. He was also to follow-up with a primary care provider and here at the wound care center within a week. I am just now seeing him on the 13th almost a month after discharge. He does have a history of again osteomyelitis of this right hip/ischial tuberosity location. 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 The reason for his admission was actually worsening of the decubitus ulcer upon admission. He does have chronic osteomyelitis of this area. He was treated with empiric antibiotics he had acute kidney injury which improved with IV fluids he also had hypokalemia. Surgical debridement was performed by general surgery at that time and ID was consulted to assist with management according to the notes. IV antibiotics were recommended but patient declined and wanted oral antibiotics at discharge. Therefore no IV antibiotics were planned for discharge time. This case was under the care of Dr. Joylene Draft while he was in the hospital when she did discharge him with a 2-week course of doxycycline and Augmentin according to the notes. It looks like Dr. Judithann Sheen office did call and leave a message for the patient to get scheduled for a follow-up visit after  hospital discharge I do not see where he showed up in fact it appears that the patient has a no-show letter from Dr. Judithann Sheen office noted in epic due to a appointment that was missed on 10/06/2021. Looking at the pictures from Dr. Cristine Polio notes on 09/23/2021  it does appear that the wound is a little bit better as far as the cleanliness of the surface of the wound today but nonetheless still is a very significant wound. 10/26/2021 upon evaluation today patient appears to be doing okay in regard to his wound. Again this is a wound that he did indeed have osteomyelitis and previous. With that being said I do not see any signs of a worsening infection at this time which is good news. Overall I think that we are headed in the right direction although still I think he needs more aggressive offloading. 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 I do believe this would be ideal for him to be honest. Readmission: 01-15-2022 upon evaluation today patient presents for reevaluation he was last seen on October 26, 2021. At that time unfortunately he was having issues with a significant ischial pressure ulcer on the right ischial tuberosity. Subsequently he ended up having decent response to the Dakin's moistened gauze dressing that was previously being utilized and subsequently the patient's sister who is his primary caregiver as well states that she decided just to try to manage this at home. Nonetheless unfortunately this is getting a little larger not better. He does spend a lot of the day in his chair. He does have an air mattress according what they tell me today. That was previously ordered. With that being said I do believe that at this time things do appear to be doing much better from the standpoint of infection I do not see any signs of overt infection. There is also no evidence of systemic infection. No fevers, chills, nausea, vomiting, or diarrhea. With that being said I do believe based on what I am seeing the wound is a bit cleaner and he possibly could be an excellent candidate for a wound VAC. 02-05-2022 upon evaluation today patient appears to be doing about the same in regard to his wound. Unfortunately he is not any better but he  also really has not had the wound VAC on sufficiently. There is been some confusion with the orders part of that I think is our follow-up with the way the order was written part of it may be with how the wound VAC is being placed either way we need to see what we can do to try to improve things in this regard. We will clarify the orders today going forward for home health. Unfortunately the patient does have a new wound today which is on the opposite side. This is not good 1 was bad enough have another area to open is definitely not a good thing. Its not as deep but nonetheless is also not very shallow either. We may potentially be able to get this to the point that we could bridge the 2 together in order to wound VAC both but right now want a focus on to try to get the wound VAC going for the main wound initially we will likely use Hydrofera Blue on the new wound.Marland Kitchen 02-19-2022 upon evaluation today patient's wound appears to be doing a little bit worse in the way of maceration fortunately there is no signs of active infection locally or systemically which is  great news. No fevers, chills, nausea, vomiting, or diarrhea. [place], Dorinda [name] ([id number]) [id number] Page 2 of 10 03-05-2022 upon evaluation today patient unfortunately does not appear to be doing nearly as good in regard to his wounds as I would like to see. Fortunately I do not see any evidence of active infection that is obvious although I am can obtain a wound culture to ensure that there is not anything getting worse here as far as the wound without the wound VAC on his concern. With that being said I will go ahead and get this sent in and evaluated will initiate treatment as needed going forward if necessary. With regard to the wound with the wound VAC this continues to be placed directly over the wound and there is obvious signs of deep tissue injury. The suction being here means that he is sitting on it I think  this may be part of the reason why it is coming off although not 100% certain. 03-12-2022 upon evaluation today patient still has not gotten the antibiotics that were called in for him 4 days ago. He tells me that his sister just told him this morning that they were ready but that she has not had a chance to go pick them up. Nonetheless I feel like that this is really something he needs any should have been taken that not the other antibiotics which were not doing the job for him to be honest. The patient states and I completely agree that there is really no way he would have known relying on his sister to let him know obviously. Nonetheless I do believe that he needs to not be taking any other antibiotics and be on the Levaquin as soon as possible. He tells me that she said she was going to pick them up today. In regard to home health I am hopeful that they actually have been bridging this now. I do think that the wound looks better and this is good news. With that being said I am not certain that I can really tell for sure how this was attached it was removed before he came in and the black foam left in place. I really want him to leave the wound VAC in place until he comes in so that we can monitor and make sure that this is being done correctly he voiced understanding. 03-19-2022 upon evaluation today and much happier with how things appear as far as the patient's wounds are concerned. I think he is on a much better track and they are doing a better job at bridging although its not quite where I like to see it is still more posterior which I think the patient needs to be a little bit more lateral on his side. Either way this is something that I did marked with a small dot today for him to tell the home health nurses well so she can bridge it to that location also think the tubing should go up instead of going down to just make it less cumbersome overall for him. 04-19-2022 upon evaluation today patient  appears to be doing a little better in regard to his wounds. Fortunately I do not see any signs of infection I do believe he is on the right track. The wound VAC does seem to be helping to clean the wound up a bit and bring it in from the base at work. This is good news. 05-10-2022 upon evaluation today patient appears to be doing well currently in regard to  his wounds in general in fact of the smaller of the 2 wounds without the wound VAC actually appears to be doing so much better this is almost completely closed and very pleased in that regard. He has been staying off of this he tells me and shows. With that being said the wound with the wound VAC is going require some sharp debridement there is some tissue which is not quite as healthy and we are going to go ahead and continue that for probably 2 more weeks although I think we may be closer to discontinuing this as well since it has healed and quite significantly. 05-24-2022 upon evaluation today patient appears to be doing well with regard to his wound in fact this looks better than I have been expected. This actually I think is a good time to go ahead and switch away from the wound VAC based on what I am seeing. I think we go to [facility] with a bordered foam dressing. 06-21-2022 upon evaluation today patient appears to be doing well currently in regard to his wound this is measuring smaller and looks well. Fortunately there does not appear to be any signs of active infection locally or systemically at this time which is great news. No fevers, chills, nausea, vomiting, or diarrhea. 07-29-2022 upon evaluation today patient appears to be doing well currently in regard to his wound. This is actually measuring smaller although there is a lot of hypergranulation noted. Fortunately there does not appear to be any signs of active infection locally or systemically at this time. No fevers, chills, nausea, vomiting, or diarrhea. 11/9; pressure ulcer on  the right buttock in the setting of spina bifida and lower extremity paralysis. He has been using [facility] 09-02-2022 upon evaluation today patient appears to be doing well currently in regard to his wound. We have been using Hydrofera Blue this is showing signs of improvement as far as getting smaller is concerned. There is some drainage around the edges of the wound but I think a lot of this is due to the fact he did not even have a dressing on that he came in today. 12/29; right buttock pressure area which was initially stage IV. This is in the setting of a patient with spina bifida spends most of his time in a wheelchair. I am not certain how much he is offloading this. He has been using for [facility] as a primary dressing. When he was here the last visit he was felt to have hyper granulated tissue and was treated with silver nitrate chemical cauterization 10-14-2022 upon evaluation today patient appears to be doing poorly in regard to the wound in the right ischial location. Unfortunately he tells me that the dressing seems to be falling off frequently. Unfortunately I think that this has contributed to him developing an infection which appears to be exemplified by the fact that he has bone exposed which is actually necrotic and working its way out of the wound up and have to perform a fairly significant debridement today it does appear. I think that he needs to have these dressings ensure that they are in place at all times. Also discussed with the patient that we will get a need to do some testing in order to see if he indeed has an infection. If he does have a bone infection which I think is pretty likely to be osteomyelitis of this ischial location. I discussed that with the patient in detail today and depending on  what we see him and make any adjustments in care as far as going forward is concerned. Obviously depressed antibiotic that we gave him that he tells me was somewhat  irritating as far as the stomach side effects are concerned. He states he really would not want to be on that again. Either way I feel he is probably going to be placed on something once we get the results of the culture back. I am honestly concerned about the fact that he comes into the clinic without any dressings in place I am not sure how often they are really staying where they need to be as far as this wound is concerned. I actually cannot remember seeing him anytime in the past 2 months or so at least that he came in with a dressing intact. 10-21-2022 upon evaluation today patient presents for initial inspection here in our clinic concerning issues that he has been having with a wound in the sacral area. Again last time he was here I did debride away necrotic bone which was confirmed on pathology although it was not necessarily confirmed that this was secondary to osteomyelitis on the pathology report. Also the x-ray did not neither confirm nor deny the presence of osteomyelitis and his culture unfortunately showed multiple organisms with nothing predominance of this does not help to rule and tailor down exactly what is going on here. Fortunately I do not see any signs of active infection systemically which is great news. With that being said locally I am definitely seeing some issues here. In fact this is warm to touch around the sacral area and I think that cellulitis is definitely present I just cannot pinpoint the exact organism. With that being said I did discuss with the patient as well today that I do believe that he probably needs an MRI in order to further quantify the extent of her likely osteomyelitis and this again is something that we are going to order this will need to be without contrast due to the fact that he is allergic to contrast dye. 11-18-2022 upon evaluation today patient's wound is actually showing some signs of improvement currently. We have been doing the Dakin's  moistened gauze dressing which I think is helping to clean up the wound for seeing some granulation growing and still the tissue is not quite as healthy as I would love to see but better than previous. He does have osteomyelitis but the good news is he seems to be treated well with the antibiotics he does have a refill that was just picked up and I think that he needs to continue to take that for certain. 3/7; patient is on a 6-week clinic hiatus. He lives at home with his sister however she is not at home all the time. He transfers out of bed and a sliding board. He claims he is only in the in the wheelchair about 4 5 hours. He does have underlying osteomyelitis I am not sure about the status of the antibiotics here. Part of the wound still easily probes to bone 12-23-2022 upon evaluation today patient appears to be doing well currently in regard to his wound all things considered there is still a lot of hypergranulation but I do feel like that with the Vashe moistened gauze is actually doing better than he was previously with the Hydrofera Blue. This just did not seem to be staying in place and with the Vashe through able to change this more frequently. Readmission: 03-31-2023 this is a  gentleman whom I have seen multiple times intermittently throughout the years compliance has been a real issue however. I do feel like there is some significant issues here still with the fact that dressing changes and keeping dressings in place are still pretty much as a significant problem here for [place]. I do not see any signs of active infection at this time which is great news but at the same time the wounds do not worse in fact he has another wound different than what I even noted previous. Subsequently he I do believe that the patient should continue to do monitor for any evidence of infection or worsening. Overall I think he needs to have regular follow-up visits with [place] and I think that he needs to have dressings  in place at all times. He tells me that he is at home primarily by himself transferring himself from the first thing in the morning through at least 3 in the afternoon as his sister works he really needs [place], [place] ([id number]) [id number] Page 3 of 10 somebody gets a caregiver with him throughout the day. 04-12-2023 upon evaluation today patient appears to be doing a little worse currently in regard to his wounds. He has been tolerating the dressing changes without complication. Fortunately I do not see any evidence of active infection locally nor systemically at this point. 04-21-2023 upon evaluation today patient's wound unfortunately appears to have purulent drainage and also there is a malodorous drainage at this point. Unfortunately I think that there is something going on here that does not seem to be doing nearly as good as what we would like to see. I think that he is likely going to require some antibiotics. He has taken Cipro before without complication. 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 On 7/18 he had purulent drainage he had a PCR culture and is now on Cipro he is tolerating this well. Using Hydrofera Blue's a 2 bit and border foam his sister is changing the dressing After his sister goes to work the patient is apparently in a wheelchair all day. There is no way to heal the wound like this with that degree of prolonged pressure. 05-12-2023 upon evaluation today patient unfortunately appears to be doing much worse than what we previously seen. Last time he was here he was seen by Dr. Leanord Hawking. This was shortly after I put him on Cipro he was tolerating that well. Nonetheless he did not show up last week apparently there was a transportation issue. This week upon arrival this wound appears to be doing significantly worse there is a significant malodorous drainage and this is deeper he has erythema in the gluteal  region around it appears to be infected and to be honest despite the Cipro and oral antibiotics he seems to be doing worse not better. 05-19-2023 upon evaluation patient appears to be doing better than last time I saw him with regard to his wound in the right gluteal location. Fortunately there does not appear to be any signs of systemic infection though he does have evidence of osteomyelitis noted which were acute on chronic and that was the findings that were noted in the hospital when he was admitted most recently after I sent him from May 12, 2023 through May 17, 2023. The patient was treated with IV Zosyn and bank while in the hospital and discharged on Augmentin although that has not been picked up yet it has been 2  days since he was discharged he states that "the pharmacy never called his sister to let her know it was ready". Nonetheless this needs to be gotten ASAP to make sure that he is fully treated as far as the infection is concerned I discussed that with him today as well. 05-26-2023 upon evaluation today patient appears to be doing better today with regard to his wounds compared to what we have noticed previous. Fortunately I do not see any evidence of active infection locally or systemically which is great news and in general I do believe that we are making headway towards complete closure. With that being said we still had some trouble getting the order done for the trapeze and aids to help him with regard to his daily activities and preventing the dressings from coming off. We have reached out to his CAP Worker and have not gotten in touch with her as of yet. I did recommend that the patient try to see. He or his sister to get in touch with [facility] and see if they can help [place] in that regard. 8/29; patient with 2 wounds in close juxtaposition on the right buttock. Both of these probes very close to the bone especially the area over the ischial tuberosity. Patient was recently in  the hospital for additional antibiotics I did not look at this today. We are using Dakin's wet to dry. His sister changes in dressings. The wounds are measuring larger. Electronic Signature(s) Signed: 06/02/2023 5:20:34 PM By: Baltazar Najjar MD Entered By: Baltazar Najjar on 06/02/2023 14:40:09 -------------------------------------------------------------------------------- Physical Exam Details Patient Name: Date of Service: Chase Post [facility], [name] 06/02/2023 1:30 PM Medical Record Number: [id number] Patient Account Number: [id number] Date of Birth/Sex: Treating RN: 03/01/1966 (56 y.o. [name] Primary Care Provider: Aram Beecham Other Clinician: Referring Provider: Treating Provider/Extender: RO BSO N, MICHA EL Cathlean Sauer in Treatment: 9 Constitutional Sitting or standing Blood Pressure is within target range for patient.. Pulse regular and within target range for patient.Marland Kitchen Respirations regular, non-labored and within target range.. Temperature is normal and within the target range for the patient.Marland Kitchen appears in no distress. Notes Wound exam; right ischial tuberosity fairly large wound with considerable depth scant amount of tissue over the bone lateral to this is another wound this is not as deep. These do not connect at least that I can improve today although there is certainly at risk of this. There is no erythema around the wound no purulence. No crepitus to palpation. Electronic Signature(s) Signed: 06/02/2023 5:20:34 PM By: Baltazar Najjar MD Entered By: Baltazar Najjar on 06/02/2023 14:41:17 [name] ([id number]) [id number] Page 4 of 10 -------------------------------------------------------------------------------- Physician Orders Details Patient Name: Date of ServiceEulas Post [facility], [name] 06/02/2023 1:30 PM Medical Record Number: [id number] Patient Account Number: [id number] Date of Birth/Sex: Treating RN: Jan 29, 1966  (57 y.o. [name]) [name] Primary Care Provider: Aram Beecham Other Clinician: Referring Provider: Treating Provider/Extender: RO BSO N, MICHA EL Cathlean Sauer in Treatment: 9 Verbal / Phone Orders: No Diagnosis Coding Follow-up Appointments Return Appointment in 2 weeks. Bathing/ [employer] wounds with antibacterial soap and water. Anesthetic (Use 'Patient Medications' Section for Anesthetic Order Entry) Lidocaine applied to wound bed Off-Loading Low air-loss mattress (Group 2) - with hospital bed and trapeze, being worked on, pt states he and sister will; work on getting in Aeronautical engineer with case worker Wound Treatment Wound #5 - Gluteus Wound Laterality: Right, Midline Cleanser: Byram Ancillary Kit - 15 Day  Supply (Generic) 3 x Per Week/30 Days Discharge Instructions: Use supplies as instructed; Kit contains: (15) Saline Bullets; (15) 3x3 Gauze; 15 pr Gloves Prim Dressing: Gauze 3 x Per Week/30 Days ary Discharge Instructions: moistened with Dakins Solution Secondary Dressing: (BORDER) Zetuvit Plus SILICONE BORDER Dressing 4x4 (in/in) (Generic) 3 x Per Week/30 Days Discharge Instructions: Please do not put silicone bordered dressings under wraps. Use non-bordered dressing only. Wound #6 - Gluteus Wound Laterality: Right, Lateral Cleanser: Byram Ancillary Kit - 15 Day Supply (Generic) 3 x Per Week/30 Days Discharge Instructions: Use supplies as instructed; Kit contains: (15) Saline Bullets; (15) 3x3 Gauze; 15 pr Gloves Prim Dressing: Gauze 3 x Per Week/30 Days ary Discharge Instructions: moistened with Dakins Solution Secondary Dressing: (BORDER) Zetuvit Plus SILICONE BORDER Dressing 4x4 (in/in) (Generic) 3 x Per Week/30 Days Discharge Instructions: Please do not put silicone bordered dressings under wraps. Use non-bordered dressing only. Electronic Signature(s) Signed: 06/02/2023 2:16:24 PM By: [name] RN Signed: 06/02/2023 5:20:34 PM By: Baltazar Najjar  MD Entered By: [name] on 06/02/2023 14:16:23 [name] ([id number]) [id number] Page 5 of 10 -------------------------------------------------------------------------------- Problem List Details Patient [facility]: [name] 06/02/2023 1:30 PM Medical Record Number: [id number] Patient Account Number: [id number] Date of Birth/Sex: Treating RN: 08/15/1966 (57 y.o. [name]) [name] Primary Care Provider: Aram Beecham Other Clinician: Referring Provider: Treating Provider/Extender: RO BSO N, MICHA EL Cathlean Sauer in Treatment: 9 Active Problems ICD-10 Encounter Code Description Active Date MDM Diagnosis M86.68 Other chronic osteomyelitis, other site 05/19/2023 No Yes L89.314 Pressure ulcer of right buttock, stage 4 03/31/2023 No Yes Q76.0 Spina bifida occulta 03/31/2023 No Yes Z93.3 Colostomy status 03/31/2023 No Yes I10 Essential (primary) hypertension 03/31/2023 No Yes I25.10 Atherosclerotic heart disease of native coronary artery without angina pectoris 03/31/2023 No Yes Inactive Problems Resolved Problems Electronic Signature(s) Signed: 06/02/2023 5:20:34 PM By: Baltazar Najjar MD Entered By: Baltazar Najjar on 06/02/2023 14:38:50 Progress Note Details -------------------------------------------------------------------------------- [name] ([id number]) [id number] Page 6 of 10 Patient Name: [facility] Service[facility], [name] 06/02/2023 1:30 PM Medical Record Number: [id number] Patient Account Number: [id number] Date of Birth/Sex: Treating RN: [birth year] (57 y.o. [name]) [name] Primary Care Provider: Aram Beecham Other Clinician: Referring Provider: Treating Provider/Extender: RO BSO N, MICHA EL Cathlean Sauer in Treatment: 9 Subjective History of Present Illness (HPI) 57 year old male with a history of spina bifida presenting to [place] with a history of a  open wound on the left gluteal region near his upper thigh which she's had for several weeks. He was seen in the ED recently at [facility] and was put on doxycycline and was advised some local care. his past medical history is significant for spinal bifid, neurogenic bladder, kidney stones, sacral pressure sores, constipation. Past surgical history significant for partial cystectomy, ileal conduit, VP shunt removal, percutaneous nephro lithotripsy. In the remote past the patient says he's had some treatment for a ulcer on this area and was treated with a skin graft. Readmission: 10/16/2021 upon evaluation today patient appears to be doing somewhat poorly in regard to a fairly large wound noted over the right ischial tuberosity location. This is a stage IV pressure ulcer. Of note this is also positive for osteomyelitis upon a CT scan which was performed during the time that he was admitted in the hospital on 09/19/2021. With that being said it is noted in the right inferior buttock that he has a decubitus ulcer which extends  to the posterior toe inferior right ischium where there is evidence of osteomyelitis. When he was here in the office actually missed that this was positive I missed read it and thought it said that there was no osteomyelitis. That is not the case there is in fact osteomyelitis noted here. I actually did review his notes as well in epic. Looking to the discharge summary it appears that the patient was admitted from 09/19/2021 through 09/23/2021. He was discharged home with home health care according to the note. With that being said from what I understand his sister actually takes care of him at this point. He was also to follow-up with a primary care provider and here at the wound care center within a week. I am just now seeing him on the 13th almost a month after discharge. He does have a history of again osteomyelitis of this right hip/ischial tuberosity location. 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 The reason for his admission was actually worsening of the decubitus ulcer upon admission. He does have chronic osteomyelitis of this area. He was treated with empiric antibiotics he had acute kidney injury which improved with IV fluids he also had hypokalemia. Surgical debridement was performed by general surgery at that time and ID was consulted to assist with management according to the notes. IV antibiotics were recommended but patient declined and wanted oral antibiotics at discharge. Therefore no IV antibiotics were planned for discharge time. This case was under the care of Dr. Joylene Draft while he was in the hospital when she did discharge him with a 2-week course of doxycycline and Augmentin according to the notes. It looks like Dr. Judithann Sheen office did call and leave a message for the patient to get scheduled for a follow-up visit after hospital discharge I do not see where he showed up in fact it appears that the patient has a no-show letter from Dr. Judithann Sheen office noted in epic due to a appointment that was missed on 10/06/2021. Looking at the pictures from Dr. Cristine Polio notes on 09/23/2021 it does appear that the wound is a little bit better as far as the cleanliness of the surface of the wound today but nonetheless still is a very significant wound. 10/26/2021 upon evaluation today patient appears to be doing okay in regard to his wound. Again this is a wound that he did indeed have osteomyelitis and previous. With that being said I do not see any signs of a worsening infection at this time which is good news. Overall I think that we are headed in the right direction although still I think he needs more aggressive offloading. 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 I do believe  this would be ideal for him to be honest. Readmission: 01-15-2022 upon evaluation today patient presents for reevaluation he was last seen on October 26, 2021. At that time unfortunately he was having issues with a significant ischial pressure ulcer on the right ischial tuberosity. Subsequently he ended up having decent response to the Dakin's moistened gauze dressing that was previously being utilized and subsequently the patient's sister who is his primary caregiver as well states that she decided just to try to manage this at home. Nonetheless unfortunately this is getting a little larger not better. He does spend a lot of  the day in his chair. He does have an air mattress according what they tell me today. That was previously ordered. With that being said I do believe that at this time things do appear to be doing much better from the standpoint of infection I do not see any signs of overt infection. There is also no evidence of systemic infection. No fevers, chills, nausea, vomiting, or diarrhea. With that being said I do believe based on what I am seeing the wound is a bit cleaner and he possibly could be an excellent candidate for a wound VAC. 02-05-2022 upon evaluation today patient appears to be doing about the same in regard to his wound. Unfortunately he is not any better but he also really has not had the wound VAC on sufficiently. There is been some confusion with the orders part of that I think is our follow-up with the way the order was written part of it may be with how the wound VAC is being placed either way we need to see what we can do to try to improve things in this regard. We will clarify the orders today going forward for home health. Unfortunately the patient does have a new wound today which is on the opposite side. This is not good 1 was bad enough have another area to open is definitely not a good thing. Its not as deep but nonetheless is also not very shallow either. We may  potentially be able to get this to the point that we could bridge the 2 together in order to wound VAC both but right now want a focus on to try to get the wound VAC going for the main wound initially we will likely use Hydrofera Blue on the new wound.Marland Kitchen 02-19-2022 upon evaluation today patient's wound appears to be doing a little bit worse in the way of maceration fortunately there is no signs of active infection locally or systemically which is great news. No fevers, chills, nausea, vomiting, or diarrhea. 03-05-2022 upon evaluation today patient unfortunately does not appear to be doing nearly as good in regard to his wounds as I would like to see. Fortunately I do not see any evidence of active infection that is obvious although I am can obtain a wound culture to ensure that there is not anything getting worse here as far as the wound without the wound VAC on his concern. With that being said I will go ahead and get this sent in and evaluated will initiate treatment as needed going forward if necessary. With regard to the wound with the wound VAC this continues to be placed directly over the wound and there is obvious signs of deep tissue injury. The suction being here means that he is sitting on it I think this may be part of the reason why it is coming off although not 100% certain. 03-12-2022 upon evaluation today patient still has not gotten the antibiotics that were called in for him 4 days ago. He tells me that his sister just told him this morning that they were ready but that she has not had a chance to go pick them up. Nonetheless I feel like that this is really something he needs any should have been taken that not the other antibiotics which were not doing the job for him to be honest. The patient states and I completely agree that there is really no way he would have known relying on his sister to let him know obviously. Nonetheless I do  believe that he needs to not be taking any other  antibiotics and be on the Levaquin as soon as possible. He tells me that she said she was going to pick them up today. In regard to home health I am hopeful that they actually have been bridging this now. I do think that the wound looks better and this is good news. With that being said I am not certain that I can really tell for sure how this was attached it was removed before he came in and the black foam left in place. I really want him to leave the wound VAC in place until he comes in so that we can monitor and make sure that this is being done correctly he voiced understanding. 03-19-2022 upon evaluation today and much happier with how things appear as far as the patient's wounds are concerned. I think he is on a much better track and they are doing a better job at bridging although its not quite where I like to see it is still more posterior which I think the patient needs to be a little bit more lateral on his side. Either way this is something that I did marked with a small dot today for him to tell the home health nurses well so she can bridge it to that location also think the tubing should go up instead of going down to just make it less cumbersome overall for him. 04-19-2022 upon evaluation today patient appears to be doing a little better in regard to his wounds. Fortunately I do not see any signs of infection I do believe he is on the right track. The wound VAC does seem to be helping to clean the wound up a bit and bring it in from the base at work. This is good news. [place], Dorinda [name] ([id number]) [id number] Page 7 of 10 05-10-2022 upon evaluation today patient appears to be doing well currently in regard to his wounds in general in fact of the smaller of the 2 wounds without the wound VAC actually appears to be doing so much better this is almost completely closed and very pleased in that regard. He has been staying off of this he tells me and shows. With that  being said the wound with the wound VAC is going require some sharp debridement there is some tissue which is not quite as healthy and we are going to go ahead and continue that for probably 2 more weeks although I think we may be closer to discontinuing this as well since it has healed and quite significantly. 05-24-2022 upon evaluation today patient appears to be doing well with regard to his wound in fact this looks better than I have been expected. This actually I think is a good time to go ahead and switch away from the wound VAC based on what I am seeing. I think we go to [facility] with a bordered foam dressing. 06-21-2022 upon evaluation today patient appears to be doing well currently in regard to his wound this is measuring smaller and looks well. Fortunately there does not appear to be any signs of active infection locally or systemically at this time which is great news. No fevers, chills, nausea, vomiting, or diarrhea. 07-29-2022 upon evaluation today patient appears to be doing well currently in regard to his wound. This is actually measuring smaller although there is a lot of hypergranulation noted. Fortunately there does not appear to be any signs of active infection locally or systemically at this  time. No fevers, chills, nausea, vomiting, or diarrhea. 11/9; pressure ulcer on the right buttock in the setting of spina bifida and lower extremity paralysis. He has been using [facility] 09-02-2022 upon evaluation today patient appears to be doing well currently in regard to his wound. We have been using Hydrofera Blue this is showing signs of improvement as far as getting smaller is concerned. There is some drainage around the edges of the wound but I think a lot of this is due to the fact he did not even have a dressing on that he came in today. 12/29; right buttock pressure area which was initially stage IV. This is in the setting of a patient with spina bifida spends most of his  time in a wheelchair. I am not certain how much he is offloading this. He has been using for [facility] as a primary dressing. When he was here the last visit he was felt to have hyper granulated tissue and was treated with silver nitrate chemical cauterization 10-14-2022 upon evaluation today patient appears to be doing poorly in regard to the wound in the right ischial location. Unfortunately he tells me that the dressing seems to be falling off frequently. Unfortunately I think that this has contributed to him developing an infection which appears to be exemplified by the fact that he has bone exposed which is actually necrotic and working its way out of the wound up and have to perform a fairly significant debridement today it does appear. I think that he needs to have these dressings ensure that they are in place at all times. Also discussed with the patient that we will get a need to do some testing in order to see if he indeed has an infection. If he does have a bone infection which I think is pretty likely to be osteomyelitis of this ischial location. I discussed that with the patient in detail today and depending on what we see him and make any adjustments in care as far as going forward is concerned. Obviously depressed antibiotic that we gave him that he tells me was somewhat irritating as far as the stomach side effects are concerned. He states he really would not want to be on that again. Either way I feel he is probably going to be placed on something once we get the results of the culture back. I am honestly concerned about the fact that he comes into the clinic without any dressings in place I am not sure how often they are really staying where they need to be as far as this wound is concerned. I actually cannot remember seeing him anytime in the past 2 months or so at least that he came in with a dressing intact. 10-21-2022 upon evaluation today patient presents for initial  inspection here in our clinic concerning issues that he has been having with a wound in the sacral area. Again last time he was here I did debride away necrotic bone which was confirmed on pathology although it was not necessarily confirmed that this was secondary to osteomyelitis on the pathology report. Also the x-ray did not neither confirm nor deny the presence of osteomyelitis and his culture unfortunately showed multiple organisms with nothing predominance of this does not help to rule and tailor down exactly what is going on here. Fortunately I do not see any signs of active infection systemically which is great news. With that being said locally I am definitely seeing some issues here. In fact  this is warm to touch around the sacral area and I think that cellulitis is definitely present I just cannot pinpoint the exact organism. With that being said I did discuss with the patient as well today that I do believe that he probably needs an MRI in order to further quantify the extent of her likely osteomyelitis and this again is something that we are going to order this will need to be without contrast due to the fact that he is allergic to contrast dye. 11-18-2022 upon evaluation today patient's wound is actually showing some signs of improvement currently. We have been doing the Dakin's moistened gauze dressing which I think is helping to clean up the wound for seeing some granulation growing and still the tissue is not quite as healthy as I would love to see but better than previous. He does have osteomyelitis but the good news is he seems to be treated well with the antibiotics he does have a refill that was just picked up and I think that he needs to continue to take that for certain. 3/7; patient is on a 6-week clinic hiatus. He lives at home with his sister however she is not at home all the time. He transfers out of bed and a sliding board. He claims he is only in the in the wheelchair about  4 5 hours. He does have underlying osteomyelitis I am not sure about the status of the antibiotics here. Part of the wound still easily probes to bone 12-23-2022 upon evaluation today patient appears to be doing well currently in regard to his wound all things considered there is still a lot of hypergranulation but I do feel like that with the Vashe moistened gauze is actually doing better than he was previously with the Hydrofera Blue. This just did not seem to be staying in place and with the Vashe through able to change this more frequently. Readmission: 03-31-2023 this is a gentleman whom I have seen multiple times intermittently throughout the years compliance has been a real issue however. I do feel like there is some significant issues here still with the fact that dressing changes and keeping dressings in place are still pretty much as a significant problem here for [place]. I do not see any signs of active infection at this time which is great news but at the same time the wounds do not worse in fact he has another wound different than what I even noted previous. Subsequently he I do believe that the patient should continue to do monitor for any evidence of infection or worsening. Overall I think he needs to have regular follow-up visits with [place] and I think that he needs to have dressings in place at all times. He tells me that he is at home primarily by himself transferring himself from the first thing in the morning through at least 3 in the afternoon as his sister works he really needs somebody gets a caregiver with him throughout the day. 04-12-2023 upon evaluation today patient appears to be doing a little worse currently in regard to his wounds. He has been tolerating the dressing changes without complication. Fortunately I do not see any evidence of active infection locally nor systemically at this point. 04-21-2023 upon evaluation today patient's wound unfortunately appears to have purulent  drainage and also there is a malodorous drainage at this point. Unfortunately I think that there is something going on here that does not seem to be doing nearly as good as what  we would like to see. I think that he is likely going to require some antibiotics. He has taken Cipro before without complication. 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 On 7/18 he had purulent drainage he had a PCR culture and is now on Cipro he is tolerating this well. Using Hydrofera Blue's a 2 bit and border foam his sister is changing the dressing After his sister goes to work the patient is apparently in a wheelchair all day. There is no way to heal the wound like this with that degree of prolonged pressure. 05-12-2023 upon evaluation today patient unfortunately appears to be doing much worse than what we previously seen. Last time he was here he was seen by Dr. Leanord Hawking. This was shortly after I put him on Cipro he was tolerating that well. Nonetheless he did not show up last week apparently there was a transportation issue. This week upon arrival this wound appears to be doing significantly worse there is a significant malodorous drainage and this is deeper he has erythema in the gluteal region around it appears to be infected and to be honest despite the Cipro and oral antibiotics he seems to be doing worse not better. 05-19-2023 upon evaluation patient appears to be doing better than last time I saw him with regard to his wound in the right gluteal location. Fortunately there does not appear to be any signs of systemic infection though he does have evidence of osteomyelitis noted which were acute on chronic and that was the [place], [place] ([id number]) [id number] Page 8 of 10 findings that were noted in the hospital when he was admitted most recently after I sent him from May 12, 2023 through May 17, 2023. The patient was treated with IV Zosyn  and bank while in the hospital and discharged on Augmentin although that has not been picked up yet it has been 2 days since he was discharged he states that "the pharmacy never called his sister to let her know it was ready". Nonetheless this needs to be gotten ASAP to make sure that he is fully treated as far as the infection is concerned I discussed that with him today as well. 05-26-2023 upon evaluation today patient appears to be doing better today with regard to his wounds compared to what we have noticed previous. Fortunately I do not see any evidence of active infection locally or systemically which is great news and in general I do believe that we are making headway towards complete closure. With that being said we still had some trouble getting the order done for the trapeze and aids to help him with regard to his daily activities and preventing the dressings from coming off. We have reached out to his CAP Worker and have not gotten in touch with her as of yet. I did recommend that the patient try to see. He or his sister to get in touch with Angelique and see if they can help [place] in that regard. 8/29; patient with 2 wounds in close juxtaposition on the right buttock. Both of these probes very close to the bone especially the area over the ischial tuberosity. Patient was recently in the hospital for additional antibiotics I did not look at this today. We are using Dakin's wet to dry. His sister changes in dressings. The wounds are measuring larger. Objective Constitutional Sitting or standing Blood Pressure is within target range for patient.. Pulse regular and within target range  for patient.Marland Kitchen Respirations regular, non-labored and within target range.. Temperature is normal and within the target range for the patient.Marland Kitchen appears in no distress. Vitals Time Taken: 1:30 PM, Height: 59 in, Weight: 90 lbs, BMI: 18.2, Temperature: 97.7 F, Pulse: 107 bpm, Respiratory Rate: 18 breaths/min, Blood  Pressure: 106/59 mmHg. General Notes: Wound exam; right ischial tuberosity fairly large wound with considerable depth scant amount of tissue over the bone lateral to this is another wound this is not as deep. These do not connect at least that I can improve today although there is certainly at risk of this. There is no erythema around the wound no purulence. No crepitus to palpation. Integumentary (Hair, Skin) Wound #5 status is Open. Original cause of wound was Gradually Appeared. The date acquired was: 10/04/2021. The wound has been in treatment 9 weeks. The wound is located on the Right,Midline Gluteus. The wound measures 6.7cm length x 2.2cm width x 2.4cm depth; 11.577cm^2 area and 27.784cm^3 volume. There is bone, muscle, and Fat Layer (Subcutaneous Tissue) exposed. There is no tunneling or undermining noted. There is a large amount of serosanguineous drainage noted. There is medium (34-66%) red, pink granulation within the wound bed. There is a medium (34-66%) amount of necrotic tissue within the wound bed including Adherent Slough. Wound #6 status is Open. Original cause of wound was Gradually Appeared. The date acquired was: 10/04/2021. The wound has been in treatment 9 weeks. The wound is located on the Right,Lateral Gluteus. The wound measures 4.2cm length x 1.3cm width x 1.2cm depth; 4.288cm^2 area and 5.146cm^3 volume. There is Fat Layer (Subcutaneous Tissue) exposed. There is no tunneling or undermining noted. There is a medium amount of serosanguineous drainage noted. There is large (67-100%) red, pink granulation within the wound bed. There is a small (1-33%) amount of necrotic tissue within the wound bed including Adherent Slough. Assessment Active Problems ICD-10 Other chronic osteomyelitis, other site Pressure ulcer of right buttock, stage 4 Spina bifida occulta Colostomy status Essential (primary) hypertension Atherosclerotic heart disease of native coronary artery without  angina pectoris Plan Follow-up Appointments: Return Appointment in 2 weeks. Bathing/ Shower/ Hygiene: Wash wounds with antibacterial soap and water. 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 Use non-bordered dressing only. [place], Dorinda [name] ([id number]) [id number] Page 9 of 10 WOUND #6: - Gluteus Wound Laterality: Right, Lateral Cleanser: Byram Ancillary Kit - 15 Day Supply (Generic) 3 x Per Week/30 Days Discharge Instructions: Use supplies as instructed; Kit contains: (15) Saline Bullets; (15) 3x3 Gauze; 15 pr Gloves Prim Dressing: Gauze 3 x Per Week/30 Days ary Discharge Instructions: moistened with Dakins Solution Secondary Dressing: (BORDER) Zetuvit Plus SILICONE BORDER Dressing 4x4 (in/in) (Generic) 3 x Per Week/30 Days Discharge Instructions: Please do not put silicone bordered dressings under wraps. Use non-bordered dressing only. 1. At this point I think Dakin's  wet-to-dry should continue. 2. Very uncertain about the offloading of these areas and the amount of time that the patient spends in the wheelchair. I think weight and I have both talked to him about limiting the time he is up at his  wheelchair to 2 hours a day but I am not certain about this and I do not see that we have had any independent verification. 3. Although he was recently treated for osteomyelitis there is no evidence of significant infection at this point. He is certainly at risk Electronic Signature(s) Signed: 06/02/2023 5:20:34 PM By: Baltazar Najjar MD Entered By: Baltazar Najjar on 06/02/2023 14:42:38 -------------------------------------------------------------------------------- SuperBill Details Patient Name: Date of Service: Chase Post TH, [name] 06/02/2023 Medical Record Number: [id number] Patient Account Number: [id number] Date of Birth/Sex: Treating RN: Sep 09, 1966 (57 y.o. [name]) [name] Primary Care Provider: Aram Beecham Other Clinician: Referring Provider: Treating Provider/Extender: RO BSO N, MICHA EL Cathlean Sauer in Treatment: 9 Diagnosis Coding ICD-10 Codes Code Description M86.68 Other chronic osteomyelitis, other site L89.314 Pressure ulcer of right buttock, stage 4 Q76.0 Spina bifida occulta Z93.3 Colostomy status I10 Essential (primary) hypertension I25.10 Atherosclerotic heart disease of native coronary artery without angina pectoris Facility Procedures : CPT4 Code: [id number] Description: 9213 - WOUND CARE VISIT-LEV 3 EST PT Modifier: Quantity: 1 Physician Procedures : CPT4 Code Description Modifier [id number] - WC PHYS LEVEL 3 - EST PT ICD-10 Diagnosis Description L89.314 Pressure ulcer of right buttock, stage 4 M86.68 Other chronic osteomyelitis, other site Q76.0 Spina bifida occulta Quantity: 1 Electronic Signature(s) Signed: 06/02/2023 5:20:34 PM By: Baltazar Najjar MD Previous Signature: 06/02/2023 2:17:16 PM Version By: [name] RN Entered By: Baltazar Najjar on 06/02/2023 14:42:59 [name] ([id number]) [id number] Page 10 of 10

## 2023-06-16 ENCOUNTER — Encounter: Payer: 59 | Attending: Physician Assistant | Admitting: Physician Assistant

## 2023-06-16 DIAGNOSIS — Q760 Spina bifida occulta: Secondary | ICD-10-CM | POA: Insufficient documentation

## 2023-06-16 DIAGNOSIS — Z933 Colostomy status: Secondary | ICD-10-CM | POA: Diagnosis not present

## 2023-06-16 DIAGNOSIS — L89314 Pressure ulcer of right buttock, stage 4: Secondary | ICD-10-CM | POA: Insufficient documentation

## 2023-06-16 DIAGNOSIS — M8668 Other chronic osteomyelitis, other site: Secondary | ICD-10-CM | POA: Diagnosis not present

## 2023-06-16 DIAGNOSIS — I2510 Atherosclerotic heart disease of native coronary artery without angina pectoris: Secondary | ICD-10-CM | POA: Insufficient documentation

## 2023-06-16 DIAGNOSIS — Z906 Acquired absence of other parts of urinary tract: Secondary | ICD-10-CM | POA: Diagnosis not present

## 2023-06-16 DIAGNOSIS — N183 Chronic kidney disease, stage 3 unspecified: Secondary | ICD-10-CM | POA: Diagnosis not present

## 2023-06-16 DIAGNOSIS — I129 Hypertensive chronic kidney disease with stage 1 through stage 4 chronic kidney disease, or unspecified chronic kidney disease: Secondary | ICD-10-CM | POA: Diagnosis not present

## 2023-06-16 NOTE — Progress Notes (Addendum)
Oasis, [name] ([id number]) [id number] Page 1 of 10 Visit Report for 06/16/2023 Arrival Information Details Patient Name: Date of Service: [name] [facility], DO [place] LD 06/16/2023 3:15 PM Medical Record Number: [id number] Patient Account Number: [id number] Date of Birth/Sex: Treating RN: Aug 23, 1966 (57 y.o. [name]) [name] Primary Care Kambree Krauss: Aram Beecham Other Clinician: Referring Elzada Pytel: Treating Teneka Malmberg/Extender: Gabriel Earing in Treatment: 11 Visit Information History Since Last Visit Added or deleted any medications: No Patient Arrived: Wheel Chair Any new allergies or adverse reactions: No Arrival Time: 15:18 Had a fall or experienced change in No Accompanied By: self activities of daily living that may affect Transfer Assistance: Transfer Board risk of falls: Patient Identification Verified: Yes Signs or symptoms of abuse/neglect since last visito No Secondary Verification Process Completed: Yes Hospitalized since last visit: No Patient Requires Transmission-Based Precautions: No Implantable device outside of the clinic excluding No Patient Has Alerts: No cellular tissue based products placed in the center since last visit: Has Dressing in Place as Prescribed: Yes Pain Present Now: No Electronic Signature(s) Signed: 06/16/2023 3:19:02 PM By: [name] RN Entered By: [name] on 06/16/2023 15:19:02 -------------------------------------------------------------------------------- Clinic Level of Care Assessment Details Patient Name: Date of ServiceEulas [name] [facility], [name] 06/16/2023 3:15 PM Medical Record Number: [id number] Patient Account Number: [id number] Date of Birth/Sex: Treating RN: 1966-02-13 (57 y.o. [name]) [name] Primary Care Tola Meas: Aram Beecham Other Clinician: Referring Ninfa Giannelli: Treating Maridee Slape/Extender: Gabriel Earing in Treatment: 11 Clinic Level of Care Assessment  Items TOOL 4 Quantity Score X- 1 0 Use when only an EandM is performed on FOLLOW-UP visit ASSESSMENTS - Nursing Assessment / Reassessment X- 1 10 Reassessment of Co-morbidities (includes updates in patient status) X- 1 5 Reassessment of Adherence to Treatment Plan SEANPAUL, MCGHIE ([id number]) [id number] Page 2 of 10 ASSESSMENTS - Wound and Skin A ssessment / Reassessment X - Simple Wound Assessment / Reassessment - one wound 1 5 []  - 0 Complex Wound Assessment / Reassessment - multiple wounds []  - 0 Dermatologic / Skin Assessment (not related to wound area) ASSESSMENTS - Focused Assessment []  - 0 Circumferential Edema Measurements - multi extremities []  - 0 Nutritional Assessment / Counseling / Intervention []  - 0 Lower Extremity Assessment (monofilament, tuning fork, pulses) []  - 0 Peripheral Arterial Disease Assessment (using hand held doppler) ASSESSMENTS - Ostomy and/or Continence Assessment and Care []  - 0 Incontinence Assessment and Management []  - 0 Ostomy Care Assessment and Management (repouching, etc.) PROCESS - Coordination of Care X - Simple Patient / Family Education for ongoing care 1 15 []  - 0 Complex (extensive) Patient / Family Education for ongoing care []  - 0 Staff obtains Chiropractor, Records, T Results / Process Orders est []  - 0 Staff telephones HHA, Nursing Homes / Clarify orders / etc []  - 0 Routine Transfer to another Facility (non-emergent condition) []  - 0 Routine Hospital Admission (non-emergent condition) []  - 0 New Admissions / Manufacturing engineer / Ordering NPWT Apligraf, etc. , []  - 0 Emergency Hospital Admission (emergent condition) X- 1 10 Simple Discharge Coordination []  - 0 Complex (extensive) Discharge Coordination PROCESS - Special Needs []  - 0 Pediatric / Minor Patient Management []  - 0 Isolation Patient Management []  - 0 Hearing / Language / Visual special needs []  - 0 Assessment of  Community assistance (transportation, D/C planning, etc.) []  - 0 Additional assistance / Altered mentation []  - 0 Support Surface(s) Assessment (bed, cushion, seat, etc.) INTERVENTIONS - Wound Cleansing / Measurement []  -  Topics Provided Wound/Skin Impairment: Handouts: Caring for Your Ulcer Methods: Explain/Verbal Responses: State content correctly Electronic Signature(s) Signed: 06/17/2023 1:15:19 PM By: [name] RN Entered By: [name] on 06/16/2023 15:42:56 -------------------------------------------------------------------------------- Wound Assessment  Details Patient Name: Date of Service: [name] [facility], [name] 06/16/2023 3:15 PM Medical Record Number: [id number] Patient Account Number: [id number] Date of Birth/Sex: Treating RN: [birth year] (57 y.o. [name] Primary Care Asjia Berrios: Aram Beecham Other Clinician: Referring Karron Goens: Treating Saylor Sheckler/Extender: Hermine Messick Weeks in Treatment: 11 Wound Status Wound Number: 5 Primary Pressure Ulcer Etiology: Wound Location: Right, Midline Gluteus Wound Open Wounding Event: Gradually Appeared Status: Date Acquired: 10/04/2021 Comorbid Cataracts, Glaucoma, Coronary Artery Disease, Hypertension, Weeks Of Treatment: 11 History: History of pressure wounds, Osteoarthritis, Paraplegia Clustered Wound: No Photos Eminence, [name] ([id number]) [id number] Page 8 of 10 Wound Measurements Length: (cm) 5.8 Width: (cm) 3 Depth: (cm) 3 Area: (cm) 13.666 Volume: (cm) 40.998 % Reduction in Area: -39.2% % Reduction in Volume: -178.4% Epithelialization: None Tunneling: No Undermining: No Wound Description Classification: Category/Stage IV Exudate Amount: Large Exudate Type: Serosanguineous Exudate Color: red, brown Foul Odor After Cleansing: No Slough/Fibrino Yes Wound Bed Granulation Amount: Medium (34-66%) Exposed Structure Granulation Quality: Red, Pink Fascia Exposed: No Necrotic Amount: Medium (34-66%) Fat Layer (Subcutaneous Tissue) Exposed: Yes Necrotic Quality: Adherent Slough Tendon Exposed: No Muscle Exposed: Yes Necrosis of Muscle: No Joint Exposed: No Bone Exposed: Yes Treatment Notes Wound #5 (Gluteus) Wound Laterality: Right, Midline Cleanser Byram Ancillary Kit - 15 Day Supply Discharge Instruction: Use supplies as instructed; Kit contains: (15) Saline Bullets; (15) 3x3 Gauze; 15 pr Gloves Peri-Wound Care Topical Primary Dressing Gauze Discharge Instruction: moistened with Dakins Solution Secondary  Dressing (BORDER) Zetuvit Plus SILICONE BORDER Dressing 4x4 (in/in) Discharge Instruction: Please do not put silicone bordered dressings under wraps. Use non-bordered dressing only. Secured With Compression Wrap Compression Stockings Facilities manager) Signed: 06/16/2023 3:28:12 PM By: [name] RN Entered By: [name] on 06/16/2023 15:28:12 [name] ([id number]) [id number] Page 9 of 10 -------------------------------------------------------------------------------- Wound Assessment Details Patient Name: Date of Service: [name] [facility], [name] 06/16/2023 3:15 PM Medical Record Number: [id number] Patient Account Number: [id number] Date of Birth/Sex: Treating RN: [birth year] (56 y.o. [name]) [name] Primary Care Trevonne Nyland: Aram Beecham Other Clinician: Referring Othmar Ringer: Treating Terrina Docter/Extender: Hermine Messick Weeks in Treatment: 11 Wound Status Wound Number: 6 Primary Pressure Ulcer Etiology: Wound Location: Right, Lateral Gluteus Wound Open Wounding Event: Gradually Appeared Status: Date Acquired: 10/04/2021 Comorbid Cataracts, Glaucoma, Coronary Artery Disease, Hypertension, Weeks Of Treatment: 11 History: History of pressure wounds, Osteoarthritis, Paraplegia Clustered Wound: No Photos Wound Measurements Length: (cm) 4 Width: (cm) 1.8 Depth: (cm) 1.6 Area: (cm) 5.655 Volume: (cm) 9.048 % Reduction in Area: -182.3% % Reduction in Volume: -2156.4% Epithelialization: None Tunneling: No Undermining: No Wound Description Classification: Category/Stage IV Exudate Amount: Medium Exudate Type: Serosanguineous Exudate Color: red, brown Foul Odor After Cleansing: No Slough/Fibrino Yes Wound Bed Granulation Amount: Medium (34-66%) Exposed Structure Granulation Quality: Red, Pink Fascia Exposed: No Necrotic Amount: Medium (34-66%) Fat Layer (Subcutaneous Tissue) Exposed: Yes Necrotic Quality: Adherent  Slough Tendon Exposed: No Muscle Exposed: No Joint Exposed: No Bone Exposed: No Treatment Notes Wound #6 (Gluteus) Wound Laterality: Right, Lateral Cleanser Byram Ancillary Kit - 15 Day Supply Discharge Instruction: Use supplies as instructed; Kit contains: (15) Saline Bullets; (15) 3x3 Gauze; 15 pr Gloves Peri-Wound Care Spencer, [name] ([id number]) [id number] Page 10 of 10 Topical Primary Dressing Gauze Discharge Instruction: moistened with Dakins Solution  Oasis, [name] ([id number]) [id number] Page 1 of 10 Visit Report for 06/16/2023 Arrival Information Details Patient Name: Date of Service: [name] [facility] [place], DO [place] LD 06/16/2023 3:15 PM Medical Record Number: [id number] Patient Account Number: [id number] Date of Birth/Sex: Treating RN: Aug 23, 1966 (57 y.o. [name]) [name] Primary Care Kambree Krauss: Aram Beecham Other Clinician: Referring Elzada Pytel: Treating Teneka Malmberg/Extender: Gabriel Earing in Treatment: 11 Visit Information History Since Last Visit Added or deleted any medications: No Patient Arrived: Wheel Chair Any new allergies or adverse reactions: No Arrival Time: 15:18 Had a fall or experienced change in No Accompanied By: self activities of daily living that may affect Transfer Assistance: Transfer Board risk of falls: Patient Identification Verified: Yes Signs or symptoms of abuse/neglect since last visito No Secondary Verification Process Completed: Yes Hospitalized since last visit: No Patient Requires Transmission-Based Precautions: No Implantable device outside of the clinic excluding No Patient Has Alerts: No cellular tissue based products placed in the center since last visit: Has Dressing in Place as Prescribed: Yes Pain Present Now: No Electronic Signature(s) Signed: 06/16/2023 3:19:02 PM By: [name] RN Entered By: [name] on 06/16/2023 15:19:02 -------------------------------------------------------------------------------- Clinic Level of Care Assessment Details Patient Name: Date of ServiceEulas [name] [facility], [name] 06/16/2023 3:15 PM Medical Record Number: [id number] Patient Account Number: [id number] Date of Birth/Sex: Treating RN: 1966-02-13 (57 y.o. [name]) [name] Primary Care Tola Meas: Aram Beecham Other Clinician: Referring Ninfa Giannelli: Treating Maridee Slape/Extender: Gabriel Earing in Treatment: 11 Clinic Level of Care Assessment  Items TOOL 4 Quantity Score X- 1 0 Use when only an EandM is performed on FOLLOW-UP visit ASSESSMENTS - Nursing Assessment / Reassessment X- 1 10 Reassessment of Co-morbidities (includes updates in patient status) X- 1 5 Reassessment of Adherence to Treatment Plan SEANPAUL, MCGHIE ([id number]) [id number] Page 2 of 10 ASSESSMENTS - Wound and Skin A ssessment / Reassessment X - Simple Wound Assessment / Reassessment - one wound 1 5 []  - 0 Complex Wound Assessment / Reassessment - multiple wounds []  - 0 Dermatologic / Skin Assessment (not related to wound area) ASSESSMENTS - Focused Assessment []  - 0 Circumferential Edema Measurements - multi extremities []  - 0 Nutritional Assessment / Counseling / Intervention []  - 0 Lower Extremity Assessment (monofilament, tuning fork, pulses) []  - 0 Peripheral Arterial Disease Assessment (using hand held doppler) ASSESSMENTS - Ostomy and/or Continence Assessment and Care []  - 0 Incontinence Assessment and Management []  - 0 Ostomy Care Assessment and Management (repouching, etc.) PROCESS - Coordination of Care X - Simple Patient / Family Education for ongoing care 1 15 []  - 0 Complex (extensive) Patient / Family Education for ongoing care []  - 0 Staff obtains Chiropractor, Records, T Results / Process Orders est []  - 0 Staff telephones HHA, Nursing Homes / Clarify orders / etc []  - 0 Routine Transfer to another Facility (non-emergent condition) []  - 0 Routine Hospital Admission (non-emergent condition) []  - 0 New Admissions / Manufacturing engineer / Ordering NPWT Apligraf, etc. , []  - 0 Emergency Hospital Admission (emergent condition) X- 1 10 Simple Discharge Coordination []  - 0 Complex (extensive) Discharge Coordination PROCESS - Special Needs []  - 0 Pediatric / Minor Patient Management []  - 0 Isolation Patient Management []  - 0 Hearing / Language / Visual special needs []  - 0 Assessment of  Community assistance (transportation, D/C planning, etc.) []  - 0 Additional assistance / Altered mentation []  - 0 Support Surface(s) Assessment (bed, cushion, seat, etc.) INTERVENTIONS - Wound Cleansing / Measurement []  -  N/A A (cm) : rea 40.998 9.048 N/A Volume (cm) : -39.20% -182.30% N/A % Reduction in A rea: -178.40% -2156.40% N/A % Reduction in Volume: Category/Stage IV Category/Stage IV N/A Classification: Large Medium N/A Exudate A mount: Serosanguineous Serosanguineous N/A Exudate Type: red, brown red, brown N/A Exudate Color: Medium (34-66%) Medium (34-66%) N/A Granulation A mount: Red, Pink Red, Pink N/A Granulation Quality: Medium (34-66%) Medium (34-66%) N/A Necrotic A mount: Fat Layer (Subcutaneous Tissue): Yes Fat Layer (Subcutaneous Tissue): Yes N/A Exposed Structures: Muscle: Yes Fascia: No Bone: Yes Tendon: No [name] ([id number]) [id number] Page 5 of 10 Fascia: No Muscle: No Tendon: No Joint: No Joint: No Bone: No None None N/A Epithelialization: Treatment Notes Electronic Signature(s) Signed: 06/16/2023 3:38:05 PM By: [name] RN Entered By: [name] on 06/16/2023 15:38:05 -------------------------------------------------------------------------------- Multi-Disciplinary Care Plan Details Patient Name: Date of Service: [name] [facility], [name] 06/16/2023 3:15 PM Medical Record Number: [id number] Patient Account Number: [id number] Date of  Birth/Sex: Treating RN: May 20, 1966 (56 y.o. [name] Primary Care Zacari Stiff: Aram Beecham Other Clinician: Referring Karsen Fellows: Treating Liandra Mendia/Extender: Gabriel Earing in Treatment: 11 Active Inactive Pressure Nursing Diagnoses: Potential for impaired tissue integrity related to pressure, friction, moisture, and shear Goals: Patient will remain free from development of additional pressure ulcers Date Initiated: 03/31/2023 Target Resolution Date: 07/01/2023 Goal Status: Active Interventions: Assess: immobility, friction, shearing, incontinence upon admission and as needed Assess offloading mechanisms upon admission and as needed Assess potential for pressure ulcer upon admission and as needed Notes: Wound/Skin Impairment Nursing Diagnoses: Knowledge deficit related to ulceration/compromised skin integrity Goals: Patient/caregiver will verbalize understanding of skin care regimen Date Initiated: 03/31/2023 Target Resolution Date: 07/01/2023 Goal Status: Active Ulcer/skin breakdown will have a volume reduction of 30% by week 4 Date Initiated: 03/31/2023 Date Inactivated: 06/02/2023 Target Resolution Date: 05/31/2023 Goal Status: Unmet Unmet Reason: comorbidities Ulcer/skin breakdown will have a volume reduction of 50% by week 8 Date Initiated: 03/31/2023 Target Resolution Date: 07/01/2023 Goal Status: Active Ulcer/skin breakdown will have a volume reduction of 80% by week 12 Date Initiated: 03/31/2023 Target Resolution Date: 07/31/2023 Goal Status: Active Ulcer/skin breakdown will heal within 14 weeks Date Initiated: 03/31/2023 Target Resolution Date: 08/31/2023 DELYNN, MARANA ([id number]) [id number].pdf Page 6 of 10 Goal Status: Active Interventions: Assess patient/caregiver ability to obtain necessary supplies Assess patient/caregiver ability to perform ulcer/skin care regimen upon admission and as needed Assess  ulceration(s) every visit Notes: Electronic Signature(s) Signed: 06/16/2023 3:42:24 PM By: [name] RN Entered By: [name] on 06/16/2023 15:42:24 -------------------------------------------------------------------------------- Pain Assessment Details Patient Name: Date of ServiceEulas [name] [facility], [name] 06/16/2023 3:15 PM Medical Record Number: [id number] Patient Account Number: [id number] Date of Birth/Sex: Treating RN: 11-Sep-1966 (56 y.o. [name] Primary Care Emilina Smarr: Aram Beecham Other Clinician: Referring Liron Eissler: Treating Ludwika Rodd/Extender: Hermine Messick Weeks in Treatment: 11 Active Problems Location of Pain Severity and Description of Pain Patient Has Paino No Site Locations Pain Management and Medication Current Pain Management: Electronic Signature(s) Signed: 06/16/2023 3:19:43 PM By: [name] RN Entered By: [name] on 06/16/2023 15:19:43 [name] ([id number]) [id number] Page 7 of 10 -------------------------------------------------------------------------------- Patient/Caregiver Education Details Patient Name: Date of Service: [name] [facility], DO [place] LD 9/12/2024andnbsp3:15 PM Medical Record Number: [id number] Patient Account Number: [id number] Date of Birth/Gender: Treating RN: August 10, 1966 (56 y.o. [name]) [name] Primary Care Physician: Aram Beecham Other Clinician: Referring Physician: Treating Physician/Extender: Gabriel Earing in Treatment: 11 Education Assessment Education Provided To: Patient Education  N/A A (cm) : rea 40.998 9.048 N/A Volume (cm) : -39.20% -182.30% N/A % Reduction in A rea: -178.40% -2156.40% N/A % Reduction in Volume: Category/Stage IV Category/Stage IV N/A Classification: Large Medium N/A Exudate A mount: Serosanguineous Serosanguineous N/A Exudate Type: red, brown red, brown N/A Exudate Color: Medium (34-66%) Medium (34-66%) N/A Granulation A mount: Red, Pink Red, Pink N/A Granulation Quality: Medium (34-66%) Medium (34-66%) N/A Necrotic A mount: Fat Layer (Subcutaneous Tissue): Yes Fat Layer (Subcutaneous Tissue): Yes N/A Exposed Structures: Muscle: Yes Fascia: No Bone: Yes Tendon: No [name] ([id number]) [id number] Page 5 of 10 Fascia: No Muscle: No Tendon: No Joint: No Joint: No Bone: No None None N/A Epithelialization: Treatment Notes Electronic Signature(s) Signed: 06/16/2023 3:38:05 PM By: [name] RN Entered By: [name] on 06/16/2023 15:38:05 -------------------------------------------------------------------------------- Multi-Disciplinary Care Plan Details Patient Name: Date of Service: [name] TH, [name] 06/16/2023 3:15 PM Medical Record Number: [id number] Patient Account Number: [id number] Date of  Birth/Sex: Treating RN: May 20, 1966 (56 y.o. [name] Primary Care Zacari Stiff: Aram Beecham Other Clinician: Referring Karsen Fellows: Treating Liandra Mendia/Extender: Gabriel Earing in Treatment: 11 Active Inactive Pressure Nursing Diagnoses: Potential for impaired tissue integrity related to pressure, friction, moisture, and shear Goals: Patient will remain free from development of additional pressure ulcers Date Initiated: 03/31/2023 Target Resolution Date: 07/01/2023 Goal Status: Active Interventions: Assess: immobility, friction, shearing, incontinence upon admission and as needed Assess offloading mechanisms upon admission and as needed Assess potential for pressure ulcer upon admission and as needed Notes: Wound/Skin Impairment Nursing Diagnoses: Knowledge deficit related to ulceration/compromised skin integrity Goals: Patient/caregiver will verbalize understanding of skin care regimen Date Initiated: 03/31/2023 Target Resolution Date: 07/01/2023 Goal Status: Active Ulcer/skin breakdown will have a volume reduction of 30% by week 4 Date Initiated: 03/31/2023 Date Inactivated: 06/02/2023 Target Resolution Date: 05/31/2023 Goal Status: Unmet Unmet Reason: comorbidities Ulcer/skin breakdown will have a volume reduction of 50% by week 8 Date Initiated: 03/31/2023 Target Resolution Date: 07/01/2023 Goal Status: Active Ulcer/skin breakdown will have a volume reduction of 80% by week 12 Date Initiated: 03/31/2023 Target Resolution Date: 07/31/2023 Goal Status: Active Ulcer/skin breakdown will heal within 14 weeks Date Initiated: 03/31/2023 Target Resolution Date: 08/31/2023 DELYNN, MARANA ([id number]) [id number].pdf Page 6 of 10 Goal Status: Active Interventions: Assess patient/caregiver ability to obtain necessary supplies Assess patient/caregiver ability to perform ulcer/skin care regimen upon admission and as needed Assess  ulceration(s) every visit Notes: Electronic Signature(s) Signed: 06/16/2023 3:42:24 PM By: [name] RN Entered By: [name] on 06/16/2023 15:42:24 -------------------------------------------------------------------------------- Pain Assessment Details Patient Name: Date of ServiceEulas [name] [facility], [name] 06/16/2023 3:15 PM Medical Record Number: [id number] Patient Account Number: [id number] Date of Birth/Sex: Treating RN: 11-Sep-1966 (56 y.o. [name] Primary Care Emilina Smarr: Aram Beecham Other Clinician: Referring Liron Eissler: Treating Ludwika Rodd/Extender: Hermine Messick Weeks in Treatment: 11 Active Problems Location of Pain Severity and Description of Pain Patient Has Paino No Site Locations Pain Management and Medication Current Pain Management: Electronic Signature(s) Signed: 06/16/2023 3:19:43 PM By: [name] RN Entered By: [name] on 06/16/2023 15:19:43 [name] ([id number]) [id number] Page 7 of 10 -------------------------------------------------------------------------------- Patient/Caregiver Education Details Patient Name: Date of Service: [name] [facility], DO [place] LD 9/12/2024andnbsp3:15 PM Medical Record Number: [id number] Patient Account Number: [id number] Date of Birth/Gender: Treating RN: August 10, 1966 (56 y.o. [name]) [name] Primary Care Physician: Aram Beecham Other Clinician: Referring Physician: Treating Physician/Extender: Gabriel Earing in Treatment: 11 Education Assessment Education Provided To: Patient Education

## 2023-06-16 NOTE — Progress Notes (Addendum)
[place], [name] ([id number]) [id number] Page 1 of 10 Visit Report for 06/16/2023 Chief Complaint Document Details Patient Name: Date of Service: [name] [facility], [name] 06/16/2023 3:15 PM Medical Record Number: [id number] Patient Account Number: [id number] Date of Birth/Sex: Treating RN: [birth year] (56 y.o. [name]) [name] Primary Care Provider: Aram Beecham Other Clinician: Referring Provider: Treating Provider/Extender: [name] in Treatment: 11 Information Obtained from: Patient Chief Complaint Right ischial tuberosity pressure ulcer with chronic osteomyelitis Electronic Signature(s) Signed: 06/16/2023 3:10:39 PM By: Chase Derry PA-C Entered By: [name] on 06/16/2023 12:10:39 -------------------------------------------------------------------------------- HPI Details Patient Name: Date of Service: [name] [facility], [name] 06/16/2023 3:15 PM Medical Record Number: [id number] Patient Account Number: [id number] Date of Birth/Sex: Treating RN: 1966/01/10 (56 y.o. [name] Primary Care Provider: Aram Beecham Other Clinician: Referring Provider: Treating Provider/Extender: [name] in Treatment: 11 History of Present Illness HPI Description: 57 year old male with a history of spina bifida presenting to [place] with a history of a open wound on the left gluteal region near his upper thigh which she's had for several weeks. He was seen in the ED recently at [facility] and was put on doxycycline and was advised some local care. his past medical history is significant for spinal bifid, neurogenic bladder, kidney stones, sacral pressure sores, constipation. Past surgical history significant for partial cystectomy, ileal conduit, VP shunt removal, percutaneous nephro lithotripsy. In the remote past the patient says he's had some treatment for a ulcer on this area and was treated with a skin  graft. Readmission: 10/16/2021 upon evaluation today patient appears to be doing somewhat poorly in regard to a fairly large wound noted over the right ischial tuberosity location. This is a stage IV pressure ulcer. Of note this is also positive for osteomyelitis upon a CT scan which was performed during the time that he was admitted in the hospital on 09/19/2021. With that being said it is noted in the right inferior buttock that he has a decubitus ulcer which extends to the posterior toe inferior right ischium where there is evidence of osteomyelitis. When he was here in the office actually missed that this was positive I missed read it and thought it said that there was no osteomyelitis. That is not the case there is in fact osteomyelitis noted here. I actually did review his notes as well in epic. Looking to the discharge summary it appears that the patient was admitted from 09/19/2021 through 09/23/2021. He was discharged home with home health care according to the note. With that being said from what I understand his sister actually takes care of him at this point. He was also to follow-up with a primary care provider and here at the wound care center within a week. I am just now seeing him on the 13th almost a [name], [name] ([id number]) [id number] Page 2 of 10 month after discharge. He does have a history of again osteomyelitis of this right hip/ischial tuberosity location. 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 The reason for his admission was actually worsening of the decubitus ulcer upon admission. He does have chronic osteomyelitis of this area. He was treated with empiric antibiotics he had acute kidney injury which improved with IV fluids he also had hypokalemia. Surgical debridement was performed by general  surgery at that time and ID was consulted to assist with management according to the notes. IV antibiotics were recommended but patient declined and wanted oral antibiotics at discharge. Therefore no IV antibiotics were planned for discharge time. This case was under the care of Dr. Joylene Draft while he was in the hospital when she did discharge him with a 2-week course of doxycycline and Augmentin according to the notes. It looks like Dr. Judithann Sheen office did call and leave a message for the patient to get scheduled for a follow-up visit after hospital discharge I do not see where he showed up in fact it appears that the patient has a no-show letter from Dr. Judithann Sheen office noted in epic due to a appointment that was missed on 10/06/2021. Looking at the pictures from Dr. Cristine Polio notes on 09/23/2021 it does appear that the wound is a little bit better as far as the cleanliness of the surface of the wound today but nonetheless still is a very significant wound. 10/26/2021 upon evaluation today patient appears to be doing okay in regard to his wound. Again this is a wound that he did indeed have osteomyelitis and previous. With that being said I do not see any signs of a worsening infection at this time which is good news. Overall I think that we are headed in the right direction although still I think he needs more aggressive offloading. 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 I do believe this would be ideal for him to be honest. Readmission: 01-15-2022 upon evaluation today patient presents for reevaluation he was last seen on October 26, 2021. At that time unfortunately he was having issues with a significant ischial pressure ulcer on the right ischial tuberosity. Subsequently he ended up having decent response to the Dakin's moistened gauze dressing that was previously being utilized and subsequently the patient's sister who is his primary caregiver as well states that she  decided just to try to manage this at home. Nonetheless unfortunately this is getting a little larger not better. He does spend a lot of the day in his chair. He does have an air mattress according what they tell me today. That was previously ordered. With that being said I do believe that at this time things do appear to be doing much better from the standpoint of infection I do not see any signs of overt infection. There is also no evidence of systemic infection. No fevers, chills, nausea, vomiting, or diarrhea. With that being said I do believe based on what I am seeing the wound is a bit cleaner and he possibly could be an excellent candidate for a wound VAC. 02-05-2022 upon evaluation today patient appears to be doing about the same in regard to his wound. Unfortunately he is not any better but he also really has not had the wound VAC on sufficiently. There is been some confusion with the orders part of that I think is our follow-up with the way the order was written part of it may be with how the wound VAC is being placed either way we need to see what we can do to try to improve things in this regard. We will clarify the orders today going forward for home health. Unfortunately the patient does have a new wound today which is on the opposite side. This is not good 1 was bad enough have another area to open is definitely not a good thing. Its not as deep but nonetheless  is also not very shallow either. We may potentially be able to get this to the point that we could bridge the 2 together in order to wound VAC both but right now want a focus on to try to get the wound VAC going for the main wound initially we will likely use Hydrofera Blue on the new wound.Marland Kitchen 02-19-2022 upon evaluation today patient's wound appears to be doing a little bit worse in the way of maceration fortunately there is no signs of active infection locally or systemically which is great news. No fevers, chills, nausea, vomiting,  or diarrhea. 03-05-2022 upon evaluation today patient unfortunately does not appear to be doing nearly as good in regard to his wounds as I would like to see. Fortunately I do not see any evidence of active infection that is obvious although I am can obtain a wound culture to ensure that there is not anything getting worse here as far as the wound without the wound VAC on his concern. With that being said I will go ahead and get this sent in and evaluated will initiate treatment as needed going forward if necessary. With regard to the wound with the wound VAC this continues to be placed directly over the wound and there is obvious signs of deep tissue injury. The suction being here means that he is sitting on it I think this may be part of the reason why it is coming off although not 100% certain. 03-12-2022 upon evaluation today patient still has not gotten the antibiotics that were called in for him 4 days ago. He tells me that his sister just told him this morning that they were ready but that she has not had a chance to go pick them up. Nonetheless I feel like that this is really something he needs any should have been taken that not the other antibiotics which were not doing the job for him to be honest. The patient states and I completely agree that there is really no way he would have known relying on his sister to let him know obviously. Nonetheless I do believe that he needs to not be taking any other antibiotics and be on the Levaquin as soon as possible. He tells me that she said she was going to pick them up today. In regard to home health I am hopeful that they actually have been bridging this now. I do think that the wound looks better and this is good news. With that being said I am not certain that I can really tell for sure how this was attached it was removed before he came in and the black foam left in place. I really want him to leave the wound VAC in place until he comes in so that we  can monitor and make sure that this is being done correctly he voiced understanding. 03-19-2022 upon evaluation today and much happier with how things appear as far as the patient's wounds are concerned. I think he is on a much better track and they are doing a better job at bridging although its not quite where I like to see it is still more posterior which I think the patient needs to be a little bit more lateral on his side. Either way this is something that I did marked with a small dot today for him to tell the home health nurses well so she can bridge it to that location also think the tubing should go up instead of going down to just  make it less cumbersome overall for him. 04-19-2022 upon evaluation today patient appears to be doing a little better in regard to his wounds. Fortunately I do not see any signs of infection I do believe he is on the right track. The wound VAC does seem to be helping to clean the wound up a bit and bring it in from the base at work. This is good news. 05-10-2022 upon evaluation today patient appears to be doing well currently in regard to his wounds in general in fact of the smaller of the 2 wounds without the wound VAC actually appears to be doing so much better this is almost completely closed and very pleased in that regard. He has been staying off of this he tells me and shows. With that being said the wound with the wound VAC is going require some sharp debridement there is some tissue which is not quite as healthy and we are going to go ahead and continue that for probably 2 more weeks although I think we may be closer to discontinuing this as well since it has healed and quite significantly. 05-24-2022 upon evaluation today patient appears to be doing well with regard to his wound in fact this looks better than I have been expected. This actually I think is a good time to go ahead and switch away from the wound VAC based on what I am seeing. I think we go to  [facility] with a bordered foam dressing. 06-21-2022 upon evaluation today patient appears to be doing well currently in regard to his wound this is measuring smaller and looks well. Fortunately there does not appear to be any signs of active infection locally or systemically at this time which is great news. No fevers, chills, nausea, vomiting, or diarrhea. 07-29-2022 upon evaluation today patient appears to be doing well currently in regard to his wound. This is actually measuring smaller although there is a lot of hypergranulation noted. Fortunately there does not appear to be any signs of active infection locally or systemically at this time. No fevers, chills, nausea, vomiting, or diarrhea. 11/9; pressure ulcer on the right buttock in the setting of spina bifida and lower extremity paralysis. He has been using [facility] 09-02-2022 upon evaluation today patient appears to be doing well currently in regard to his wound. We have been using Hydrofera Blue this is showing signs of improvement as far as getting smaller is concerned. There is some drainage around the edges of the wound but I think a lot of this is due to the fact he did not even have a dressing on that he came in today. 12/29; right buttock pressure area which was initially stage IV. This is in the setting of a patient with spina bifida spends most of his time in a wheelchair. I am not certain how much he is offloading this. He has been using for [facility] as a primary dressing. When he was here the last visit he was felt to have hyper granulated tissue and was treated with silver nitrate chemical cauterization 10-14-2022 upon evaluation today patient appears to be doing poorly in regard to the wound in the right ischial location. Unfortunately he tells me that the [place] ([id number]) [id number] Page 3 of 10 dressing seems to be falling off frequently. Unfortunately I think that this  has contributed to him developing an infection which appears to be exemplified by the fact that he has bone exposed which is actually necrotic and working its  way out of the wound up and have to perform a fairly significant debridement today it does appear. I think that he needs to have these dressings ensure that they are in place at all times. Also discussed with the patient that we will get a need to do some testing in order to see if he indeed has an infection. If he does have a bone infection which I think is pretty likely to be osteomyelitis of this ischial location. I discussed that with the patient in detail today and depending on what we see him and make any adjustments in care as far as going forward is concerned. Obviously depressed antibiotic that we gave him that he tells me was somewhat irritating as far as the stomach side effects are concerned. He states he really would not want to be on that again. Either way I feel he is probably going to be placed on something once we get the results of the culture back. I am honestly concerned about the fact that he comes into the clinic without any dressings in place I am not sure how often they are really staying where they need to be as far as this wound is concerned. I actually cannot remember seeing him anytime in the past 2 months or so at least that he came in with a dressing intact. 10-21-2022 upon evaluation today patient presents for initial inspection here in our clinic concerning issues that he has been having with a wound in the sacral area. Again last time he was here I did debride away necrotic bone which was confirmed on pathology although it was not necessarily confirmed that this was secondary to osteomyelitis on the pathology report. Also the x-ray did not neither confirm nor deny the presence of osteomyelitis and his culture unfortunately showed multiple organisms with nothing predominance of this does not help to rule and tailor  down exactly what is going on here. Fortunately I do not see any signs of active infection systemically which is great news. With that being said locally I am definitely seeing some issues here. In fact this is warm to touch around the sacral area and I think that cellulitis is definitely present I just cannot pinpoint the exact organism. With that being said I did discuss with the patient as well today that I do believe that he probably needs an MRI in order to further quantify the extent of her likely osteomyelitis and this again is something that we are going to order this will need to be without contrast due to the fact that he is allergic to contrast dye. 11-18-2022 upon evaluation today patient's wound is actually showing some signs of improvement currently. We have been doing the Dakin's moistened gauze dressing which I think is helping to clean up the wound for seeing some granulation growing and still the tissue is not quite as healthy as I would love to see but better than previous. He does have osteomyelitis but the good news is he seems to be treated well with the antibiotics he does have a refill that was just picked up and I think that he needs to continue to take that for certain. 3/7; patient is on a 6-week clinic hiatus. He lives at home with his sister however she is not at home all the time. He transfers out of bed and a sliding board. He claims he is only in the in the wheelchair about 4 5 hours. He does have underlying osteomyelitis I am not  sure about the status of the antibiotics here. Part of the wound still easily probes to bone 12-23-2022 upon evaluation today patient appears to be doing well currently in regard to his wound all things considered there is still a lot of hypergranulation but I do feel like that with the Vashe moistened gauze is actually doing better than he was previously with the Hydrofera Blue. This just did not seem to be staying in place and with the Vashe  through able to change this more frequently. Readmission: 03-31-2023 this is a gentleman whom I have seen multiple times intermittently throughout the years compliance has been a real issue however. I do feel like there is some significant issues here still with the fact that dressing changes and keeping dressings in place are still pretty much as a significant problem here for [place]. I do not see any signs of active infection at this time which is great news but at the same time the wounds do not worse in fact he has another wound different than what I even noted previous. Subsequently he I do believe that the patient should continue to do monitor for any evidence of infection or worsening. Overall I think he needs to have regular follow-up visits with [place] and I think that he needs to have dressings in place at all times. He tells me that he is at home primarily by himself transferring himself from the first thing in the morning through at least 3 in the afternoon as his sister works he really needs somebody gets a caregiver with him throughout the day. 04-12-2023 upon evaluation today patient appears to be doing a little worse currently in regard to his wounds. He has been tolerating the dressing changes without complication. Fortunately I do not see any evidence of active infection locally nor systemically at this point. 04-21-2023 upon evaluation today patient's wound unfortunately appears to have purulent drainage and also there is a malodorous drainage at this point. Unfortunately I think that there is something going on here that does not seem to be doing nearly as good as what we would like to see. I think that he is likely going to require some antibiotics. He has taken Cipro before without complication. 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 On 7/18 he had purulent drainage he had a PCR culture and is now on Cipro he is tolerating this well. Using Hydrofera  Blue's a 2 bit and border foam his sister is changing the dressing After his sister goes to work the patient is apparently in a wheelchair all day. There is no way to heal the wound like this with that degree of prolonged pressure. 05-12-2023 upon evaluation today patient unfortunately appears to be doing much worse than what we previously seen. Last time he was here he was seen by Dr. Leanord Hawking. This was shortly after I put him on Cipro he was tolerating that well. Nonetheless he did not show up last week apparently there was a transportation issue. This week upon arrival this wound appears to be doing significantly worse there is a significant malodorous drainage and this is deeper he has erythema in the gluteal region around it appears to be infected and to be honest despite the Cipro and oral antibiotics he seems to be doing worse not better. 05-19-2023 upon evaluation patient appears to be doing better than last time I saw him with regard to his wound in the right gluteal  location. Fortunately there does not appear to be any signs of systemic infection though he does have evidence of osteomyelitis noted which were acute on chronic and that was the findings that were noted in the hospital when he was admitted most recently after I sent him from May 12, 2023 through May 17, 2023. The patient was treated with IV Zosyn and bank while in the hospital and discharged on Augmentin although that has not been picked up yet it has been 2 days since he was discharged he states that "the pharmacy never called his sister to let her know it was ready". Nonetheless this needs to be gotten ASAP to make sure that he is fully treated as far as the infection is concerned I discussed that with him today as well. 05-26-2023 upon evaluation today patient appears to be doing better today with regard to his wounds compared to what we have noticed previous. Fortunately I do not see any evidence of active infection locally or  systemically which is great news and in general I do believe that we are making headway towards complete closure. With that being said we still had some trouble getting the order done for the trapeze and aids to help him with regard to his daily activities and preventing the dressings from coming off. We have reached out to his CAP Worker and have not gotten in touch with her as of yet. I did recommend that the patient try to see. He or his sister to get in touch with Angel[facility] and see if they can help [place] in that regard. 8/29; patient with 2 wounds in close juxtaposition on the right buttock. Both of these probes very close to the bone especially the area over the ischial tuberosity. Patient was recently in the hospital for additional antibiotics I did not look at this today. We are using Dakin's wet to dry. His sister changes in dressings. The wounds are measuring larger. 06-16-2023 upon evaluation today patient appears to be doing worse currently at this time. Fortunately I do not see any signs of infection which is good news but at the same time he does have deep tissue injury which has me concerned about potential further breakdown here. He does tell me that he has been up on it a little bit more. Electronic Signature(s) Signed: 06/16/2023 3:26:08 PM By: Chase Derry PA-C Entered By: [name] on 06/16/2023 12:26:08 [name] ([id number]) [id number] Page 4 of 10 -------------------------------------------------------------------------------- Physical Exam Details Patient Name: Date of ServiceEulas [name] [facility], [name] 06/16/2023 3:15 PM Medical Record Number: [id number] Patient Account Number: [id number] Date of Birth/Sex: Treating RN: [birth year] (56 y.o. [name] Primary Care Provider: Aram Beecham Other Clinician: Referring Provider: Treating Provider/Extender: Hermine Messick Weeks in Treatment: 11 Constitutional Well-nourished and  well-hydrated in no acute distress. Respiratory normal breathing without difficulty. Psychiatric this patient is able to make decisions and demonstrates good insight into disease process. Alert and Oriented x 3. pleasant and cooperative. Notes Upon inspection patient's wound bed showed signs of poor granulation epithelization at this point. Fortunately I do not see any signs of active infection systemically but locally I do see signs of deep tissue injury I think that he has been sitting on this much too frequently and that is causing this to breakdown. His sister had noted the same thing. Electronic Signature(s) Signed: 06/16/2023 3:26:32 PM By: Chase Derry PA-C Entered By: [name] on 06/16/2023 12:26:31 -------------------------------------------------------------------------------- Physician Orders Details Patient Name: Date of  ServiceEulas [name] TH, [name] 06/16/2023 3:15 PM Medical Record Number: [id number] Patient Account Number: [id number] Date of Birth/Sex: Treating RN: June 01, 1966 (56 y.o. 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 Bathing/ [employer] wounds with antibacterial soap and water. Anesthetic (Use 'Patient Medications' Section for Anesthetic Order Entry) Lidocaine applied to wound bed Wound Treatment Wound #5 - Gluteus Wound Laterality: Right,  Midline Cleanser: Byram Ancillary Kit - 15 Day Supply (Generic) 3 x Per Week/30 Days Discharge Instructions: Use supplies as instructed; Kit contains: (15) Saline Bullets; (15) 3x3 Gauze; 15 pr Gloves Prim Dressing: Gauze 3 x Per Week/30 Days ary Discharge Instructions: moistened with Dakins Solution Secondary Dressing: (BORDER) Zetuvit Plus SILICONE BORDER Dressing 4x4 (in/in) (Generic) 3 x Per Week/30 Days Discharge Instructions: Please do not put silicone bordered dressings under wraps. Use non-bordered dressing only. Wound #6 - Gluteus Wound Laterality: Right, Lateral Cleanser: Byram Ancillary Kit - 15 Day Supply (Generic) 3 x Per Week/30 Days Discharge Instructions: Use supplies as instructed; Kit contains: (15) Saline Bullets; (15) 3x3 Gauze; 15 pr Gloves Prim Dressing: Gauze 3 x Per Week/30 Days ary Discharge Instructions: moistened with Dakins Solution Secondary Dressing: (BORDER) Zetuvit Plus SILICONE BORDER Dressing 4x4 (in/in) (Generic) 3 x Per Week/30 Days Discharge Instructions: Please do not put silicone bordered dressings under wraps. Use non-bordered dressing only. Electronic Signature(s) Signed: 06/16/2023 3:39:12 PM By: [name] RN Signed: 06/16/2023 4:31:22 PM By: Chase Derry PA-C Entered By: [name] on 06/16/2023 12:39:12 -------------------------------------------------------------------------------- Problem List Details Patient Name: Date of Service: [name] TH, [name] 06/16/2023 3:15 PM Medical Record Number: [id number] Patient Account Number: [id number] Date of Birth/Sex: Treating RN: 04/27/1966 (56 y.o. [name] Primary Care Provider: Aram Beecham Other Clinician: Referring Provider: Treating Provider/Extender: [name] in Treatment: 11 Active Problems ICD-10 Encounter Code Description Active Date MDM Diagnosis M86.68 Other chronic osteomyelitis, other site 05/19/2023 No Yes L89.314 Pressure ulcer of right  buttock, stage 4 03/31/2023 No Yes Q76.0 Spina bifida occulta 03/31/2023 No Yes KALYX, SPADE ([id number]) [id number] Page 6 of 10 Z93.3 Colostomy status 03/31/2023 No Yes I10 Essential (primary) hypertension 03/31/2023 No Yes I25.10 Atherosclerotic heart disease of native coronary artery without angina pectoris 03/31/2023 No Yes Inactive Problems Resolved Problems Electronic Signature(s) Signed: 06/16/2023 3:10:34 PM By: Chase Derry PA-C Entered By: [name] on 06/16/2023 12:10:34 -------------------------------------------------------------------------------- Progress Note Details Patient Name: Date of Service: [name] TH, [name] 06/16/2023 3:15 PM Medical Record Number: [id number] Patient Account Number: [id number] Date of Birth/Sex: Treating RN: 21-Sep-1966 (56 y.o. [name]) [name] Primary Care Provider: Aram Beecham Other Clinician: Referring Provider: Treating Provider/Extender: [name] in Treatment: 11 Subjective Chief Complaint Information obtained from Patient Right ischial tuberosity pressure ulcer with chronic osteomyelitis History of Present Illness (HPI) 57 year old male with a history of spina bifida presenting to [place] with a history of a open wound on the left gluteal region near his upper thigh which she's had for several weeks. He was seen in the ED recently at [facility]care system and was put on doxycycline and was advised some local care. his past medical history is significant for spinal bifid, neurogenic bladder, kidney stones, sacral pressure sores, constipation. Past surgical history significant for partial cystectomy, ileal conduit, VP shunt removal, percutaneous nephro lithotripsy. In the remote past the patient says he's had some treatment for a ulcer on this area and was treated with a skin graft. Readmission: 10/16/2021 upon evaluation today patient appears to be doing somewhat poorly in regard to a  fairly large wound noted over the right ischial tuberosity location. This is a stage IV pressure ulcer. Of note this is also positive for osteomyelitis upon a CT scan which was performed during the time that he was admitted in the hospital on 09/19/2021. With that being said it is noted in the right inferior buttock that he has a decubitus ulcer which extends to the posterior toe inferior right ischium where there is evidence of osteomyelitis. When he was here in the office actually missed that this was positive I missed read it and thought it said that there was no osteomyelitis. That is not the case there is in fact osteomyelitis noted here. I actually did review his notes as well in epic. Looking to the discharge summary it appears that the patient was admitted from 09/19/2021 through 09/23/2021. He was discharged home with home health care according to the note. With that being said from what I understand his sister actually takes care of him at this point. He was also to follow-up with a primary care provider and here at the wound care center within a week. I am just now seeing him on the 13th almost a month after discharge. He does have a history of again osteomyelitis of this right hip/ischial tuberosity location. 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 The reason for his admission was actually worsening of the decubitus ulcer upon admission. He does have chronic osteomyelitis of this area. He was treated with empiric antibiotics he had acute kidney injury which improved with IV fluids he also had hypokalemia. Surgical debridement was performed by general surgery at that time and ID was consulted to assist with management according to the notes. IV antibiotics were recommended but patient declined and wanted oral antibiotics at discharge.  Therefore no IV antibiotics were planned for discharge time. This case was under the care of Dr. Joylene Draft while he was in the hospital when she did discharge him with a 2-week course of doxycycline and Augmentin according to the notes. It looks like Dr. Judithann Sheen office did call and leave a message for the patient to get scheduled for a follow-up visit after hospital discharge I do not see where he showed up in fact it appears that the patient has a no-show letter from Dr. Judithann Sheen office noted in epic due to a appointment that was missed on 10/06/2021. Looking at the pictures from Dr. Cristine Polio notes on [place], [place] ([id number]) [id number] Page 7 of 10 09/23/2021 it does appear that the wound is a little bit better as far as the cleanliness of the surface of the wound today but nonetheless still is a very significant wound. 10/26/2021 upon evaluation today patient appears to be doing okay in regard to his wound. Again this is a wound that he did indeed have osteomyelitis and previous. With that being  said I do not see any signs of a worsening infection at this time which is good news. Overall I think that we are headed in the right direction although still I think he needs more aggressive offloading. 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 I do believe this would be ideal for him to be honest. Readmission: 01-15-2022 upon evaluation today patient presents for reevaluation he was last seen on October 26, 2021. At that time unfortunately he was having issues with a significant ischial pressure ulcer on the right ischial tuberosity. Subsequently he ended up having decent response to the Dakin's moistened gauze dressing that was previously being utilized and subsequently the patient's sister who is his primary caregiver as well states that she decided just to try to manage this at home. Nonetheless unfortunately this is getting a little larger not  better. He does spend a lot of the day in his chair. He does have an air mattress according what they tell me today. That was previously ordered. With that being said I do believe that at this time things do appear to be doing much better from the standpoint of infection I do not see any signs of overt infection. There is also no evidence of systemic infection. No fevers, chills, nausea, vomiting, or diarrhea. With that being said I do believe based on what I am seeing the wound is a bit cleaner and he possibly could be an excellent candidate for a wound VAC. 02-05-2022 upon evaluation today patient appears to be doing about the same in regard to his wound. Unfortunately he is not any better but he also really has not had the wound VAC on sufficiently. There is been some confusion with the orders part of that I think is our follow-up with the way the order was written part of it may be with how the wound VAC is being placed either way we need to see what we can do to try to improve things in this regard. We will clarify the orders today going forward for home health. Unfortunately the patient does have a new wound today which is on the opposite side. This is not good 1 was bad enough have another area to open is definitely not a good thing. Its not as deep but nonetheless is also not very shallow either. We may potentially be able to get this to the point that we could bridge the 2 together in order to wound VAC both but right now want a focus on to try to get the wound VAC going for the main wound initially we will likely use Hydrofera Blue on the new wound.Marland Kitchen 02-19-2022 upon evaluation today patient's wound appears to be doing a little bit worse in the way of maceration fortunately there is no signs of active infection locally or systemically which is great news. No fevers, chills, nausea, vomiting, or diarrhea. 03-05-2022 upon evaluation today patient unfortunately does not appear to be doing nearly as  good in regard to his wounds as I would like to see. Fortunately I do not see any evidence of active infection that is obvious although I am can obtain a wound culture to ensure that there is not anything getting worse here as far as the wound without the wound VAC on his concern. With that being said I will go ahead and get this sent in and evaluated will initiate treatment as needed going forward if necessary. With regard to the wound with the  wound VAC this continues to be placed directly over the wound and there is obvious signs of deep tissue injury. The suction being here means that he is sitting on it I think this may be part of the reason why it is coming off although not 100% certain. 03-12-2022 upon evaluation today patient still has not gotten the antibiotics that were called in for him 4 days ago. He tells me that his sister just told him this morning that they were ready but that she has not had a chance to go pick them up. Nonetheless I feel like that this is really something he needs any should have been taken that not the other antibiotics which were not doing the job for him to be honest. The patient states and I completely agree that there is really no way he would have known relying on his sister to let him know obviously. Nonetheless I do believe that he needs to not be taking any other antibiotics and be on the Levaquin as soon as possible. He tells me that she said she was going to pick them up today. In regard to home health I am hopeful that they actually have been bridging this now. I do think that the wound looks better and this is good news. With that being said I am not certain that I can really tell for sure how this was attached it was removed before he came in and the black foam left in place. I really want him to leave the wound VAC in place until he comes in so that we can monitor and make sure that this is being done correctly he voiced understanding. 03-19-2022 upon  evaluation today and much happier with how things appear as far as the patient's wounds are concerned. I think he is on a much better track and they are doing a better job at bridging although its not quite where I like to see it is still more posterior which I think the patient needs to be a little bit more lateral on his side. Either way this is something that I did marked with a small dot today for him to tell the home health nurses well so she can bridge it to that location also think the tubing should go up instead of going down to just make it less cumbersome overall for him. 04-19-2022 upon evaluation today patient appears to be doing a little better in regard to his wounds. Fortunately I do not see any signs of infection I do believe he is on the right track. The wound VAC does seem to be helping to clean the wound up a bit and bring it in from the base at work. This is good news. 05-10-2022 upon evaluation today patient appears to be doing well currently in regard to his wounds in general in fact of the smaller of the 2 wounds without the wound VAC actually appears to be doing so much better this is almost completely closed and very pleased in that regard. He has been staying off of this he tells me and shows. With that being said the wound with the wound VAC is going require some sharp debridement there is some tissue which is not quite as healthy and we are going to go ahead and continue that for probably 2 more weeks although I think we may be closer to discontinuing this as well since it has healed and quite significantly. 05-24-2022 upon evaluation today patient appears to be doing well  with regard to his wound in fact this looks better than I have been expected. This actually I think is a good time to go ahead and switch away from the wound VAC based on what I am seeing. I think we go to [facility] with a bordered foam dressing. 06-21-2022 upon evaluation today patient appears to be doing  well currently in regard to his wound this is measuring smaller and looks well. Fortunately there does not appear to be any signs of active infection locally or systemically at this time which is great news. No fevers, chills, nausea, vomiting, or diarrhea. 07-29-2022 upon evaluation today patient appears to be doing well currently in regard to his wound. This is actually measuring smaller although there is a lot of hypergranulation noted. Fortunately there does not appear to be any signs of active infection locally or systemically at this time. No fevers, chills, nausea, vomiting, or diarrhea. 11/9; pressure ulcer on the right buttock in the setting of spina bifida and lower extremity paralysis. He has been using [facility] 09-02-2022 upon evaluation today patient appears to be doing well currently in regard to his wound. We have been using Hydrofera Blue this is showing signs of improvement as far as getting smaller is concerned. There is some drainage around the edges of the wound but I think a lot of this is due to the fact he did not even have a dressing on that he came in today. 12/29; right buttock pressure area which was initially stage IV. This is in the setting of a patient with spina bifida spends most of his time in a wheelchair. I am not certain how much he is offloading this. He has been using for [facility] as a primary dressing. When he was here the last visit he was felt to have hyper granulated tissue and was treated with silver nitrate chemical cauterization 10-14-2022 upon evaluation today patient appears to be doing poorly in regard to the wound in the right ischial location. Unfortunately he tells me that the dressing seems to be falling off frequently. Unfortunately I think that this has contributed to him developing an infection which appears to be exemplified by the fact that he has bone exposed which is actually necrotic and working its way out of the wound up and have  to perform a fairly significant debridement today it does appear. I think that he needs to have these dressings ensure that they are in place at all times. Also discussed with the patient that we will get a need to do some testing in order to see if he indeed has an infection. If he does have a bone infection which I think is pretty likely to be osteomyelitis of this ischial location. I discussed that with the patient in detail today and depending on what we see him and make any adjustments in care as far as going forward is concerned. Obviously depressed antibiotic that we gave him that he tells me was somewhat irritating as far as the stomach side effects are concerned. He states he really would not want to be on that again. Either way I feel he is probably going to be placed on something once we get the results of the culture back. I am honestly concerned about the fact that he comes into the clinic without any dressings in place I am not sure how often they are really staying where they need to be as far as this wound is concerned. I actually cannot  remember seeing him anytime in the past 2 months or so at least that he came in [place] ([id number]) [id number] Page 8 of 10 with a dressing intact. 10-21-2022 upon evaluation today patient presents for initial inspection here in our clinic concerning issues that he has been having with a wound in the sacral area. Again last time he was here I did debride away necrotic bone which was confirmed on pathology although it was not necessarily confirmed that this was secondary to osteomyelitis on the pathology report. Also the x-ray did not neither confirm nor deny the presence of osteomyelitis and his culture unfortunately showed multiple organisms with nothing predominance of this does not help to rule and tailor down exactly what is going on here. Fortunately I do not see any signs of active infection systemically which  is great news. With that being said locally I am definitely seeing some issues here. In fact this is warm to touch around the sacral area and I think that cellulitis is definitely present I just cannot pinpoint the exact organism. With that being said I did discuss with the patient as well today that I do believe that he probably needs an MRI in order to further quantify the extent of her likely osteomyelitis and this again is something that we are going to order this will need to be without contrast due to the fact that he is allergic to contrast dye. 11-18-2022 upon evaluation today patient's wound is actually showing some signs of improvement currently. We have been doing the Dakin's moistened gauze dressing which I think is helping to clean up the wound for seeing some granulation growing and still the tissue is not quite as healthy as I would love to see but better than previous. He does have osteomyelitis but the good news is he seems to be treated well with the antibiotics he does have a refill that was just picked up and I think that he needs to continue to take that for certain. 3/7; patient is on a 6-week clinic hiatus. He lives at home with his sister however she is not at home all the time. He transfers out of bed and a sliding board. He claims he is only in the in the wheelchair about 4 5 hours. He does have underlying osteomyelitis I am not sure about the status of the antibiotics here. Part of the wound still easily probes to bone 12-23-2022 upon evaluation today patient appears to be doing well currently in regard to his wound all things considered there is still a lot of hypergranulation but I do feel like that with the Vashe moistened gauze is actually doing better than he was previously with the Hydrofera Blue. This just did not seem to be staying in place and with the Vashe through able to change this more frequently. Readmission: 03-31-2023 this is a gentleman whom I have seen  multiple times intermittently throughout the years compliance has been a real issue however. I do feel like there is some significant issues here still with the fact that dressing changes and keeping dressings in place are still pretty much as a significant problem here for [place]. I do not see any signs of active infection at this time which is great news but at the same time the wounds do not worse in fact he has another wound different than what I even noted previous. Subsequently he I do believe that the patient should continue to do monitor for any evidence of infection or  worsening. Overall I think he needs to have regular follow-up visits with [place] and I think that he needs to have dressings in place at all times. He tells me that he is at home primarily by himself transferring himself from the first thing in the morning through at least 3 in the afternoon as his sister works he really needs somebody gets a caregiver with him throughout the day. 04-12-2023 upon evaluation today patient appears to be doing a little worse currently in regard to his wounds. He has been tolerating the dressing changes without complication. Fortunately I do not see any evidence of active infection locally nor systemically at this point. 04-21-2023 upon evaluation today patient's wound unfortunately appears to have purulent drainage and also there is a malodorous drainage at this point. Unfortunately I think that there is something going on here that does not seem to be doing nearly as good as what we would like to see. I think that he is likely going to require some antibiotics. He has taken Cipro before without complication. 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 On 7/18 he had purulent drainage he had a PCR culture and is now on Cipro he is tolerating this well. Using Hydrofera Blue's a 2 bit and border foam his sister is changing the dressing After his sister goes to work the  patient is apparently in a wheelchair all day. There is no way to heal the wound like this with that degree of prolonged pressure. 05-12-2023 upon evaluation today patient unfortunately appears to be doing much worse than what we previously seen. Last time he was here he was seen by Dr. Leanord Hawking. This was shortly after I put him on Cipro he was tolerating that well. Nonetheless he did not show up last week apparently there was a transportation issue. This week upon arrival this wound appears to be doing significantly worse there is a significant malodorous drainage and this is deeper he has erythema in the gluteal region around it appears to be infected and to be honest despite the Cipro and oral antibiotics he seems to be doing worse not better. 05-19-2023 upon evaluation patient appears to be doing better than last time I saw him with regard to his wound in the right gluteal location. Fortunately there does not appear to be any signs of systemic infection though he does have evidence of osteomyelitis noted which were acute on chronic and that was the findings that were noted in the hospital when he was admitted most recently after I sent him from May 12, 2023 through May 17, 2023. The patient was treated with IV Zosyn and bank while in the hospital and discharged on Augmentin although that has not been picked up yet it has been 2 days since he was discharged he states that "the pharmacy never called his sister to let her know it was ready". Nonetheless this needs to be gotten ASAP to make sure that he is fully treated as far as the infection is concerned I discussed that with him today as well. 05-26-2023 upon evaluation today patient appears to be doing better today with regard to his wounds compared to what we have noticed previous. Fortunately I do not see any evidence of active infection locally or systemically which is great news and in general I do believe that we are making headway  towards complete closure. With that being said we still had some trouble getting the order  done for the trapeze and aids to help him with regard to his daily activities and preventing the dressings from coming off. We have reached out to his CAP Worker and have not gotten in touch with her as of yet. I did recommend that the patient try to see. He or his sister to get in touch with [facility] and see if they can help [place] in that regard. 8/29; patient with 2 wounds in close juxtaposition on the right buttock. Both of these probes very close to the bone especially the area over the ischial tuberosity. Patient was recently in the hospital for additional antibiotics I did not look at this today. We are using [employer] wet to dry. His sister changes in dressings. The wounds are measuring larger. 06-16-2023 upon evaluation today patient appears to be doing worse currently at this time. Fortunately I do not see any signs of infection which is good news but at the same time he does have deep tissue injury which has me concerned about potential further breakdown here. He does tell me that he has been up on it a little bit more. Objective Constitutional Well-nourished and well-hydrated in no acute distress. Vitals Time Taken: 3:00 PM, Height: 59 in, Weight: 90 lbs, BMI: 18.2, Temperature: 97.7 F, Pulse: 112 bpm, Respiratory Rate: 16 breaths/min, Blood Pressure: 109/61 mmHg. Respiratory PURCELL, RHODD ([id number]) [id number] Page 9 of 10 normal breathing without difficulty. Psychiatric this patient is able to make decisions and demonstrates good insight into disease process. Alert and Oriented x 3. pleasant and cooperative. General Notes: Upon inspection patient's wound bed showed signs of poor granulation epithelization at this point. Fortunately I do not see any signs of active infection systemically but locally I do see signs of deep tissue injury I think that he has been  sitting on this much too frequently and that is causing this to breakdown. His sister had noted the same thing. Assessment Active Problems ICD-10 Other chronic osteomyelitis, other site Pressure ulcer of right buttock, stage 4 Spina bifida occulta Colostomy status Essential (primary) hypertension Atherosclerotic heart disease of native coronary artery without angina pectoris Plan 1. After discussion with the patient today I do believe that he is getting need to be aggressive at staying off of this and he voiced understanding. 2. I am going to recommend as well that the patient should continue with offloading I think he needs spend more time in the bed alternating side-to-side and not getting up in the chair the morning gets up in the chair I think the worst this is getting. 3. I do believe that infection wise we seem to be doing quite well but at the same time he needs to be offloading much more aggressively than what he is based on what I am seeing currently. We will see patient back for reevaluation in 1 week here in the clinic. If anything worsens or changes patient will contact our office for additional recommendations. Electronic Signature(s) Signed: 06/16/2023 3:27:13 PM By: Chase Derry PA-C Entered By: [name] on 06/16/2023 12:27:12 -------------------------------------------------------------------------------- SuperBill Details Patient Name: Date of Service: [name] TH, [name] 06/16/2023 Medical Record Number: [id number] Patient Account Number: [id number] Date of Birth/Sex: Treating RN: 01/06/1966 (56 y.o. [name] Primary Care Provider: Aram Beecham Other Clinician: Referring Provider: Treating Provider/Extender: Hermine Messick Weeks in Treatment: 11 Diagnosis Coding ICD-10 Codes Code Description [phone] Other chronic osteomyelitis, other site L89.314 Pressure ulcer of right buttock, stage 4 Q76.0 Spina bifida occulta Z93.3 Colostomy  status I10 Essential (primary) hypertension AKSHAY, BILLA ([id number]) [id number] Page 10 of 10 I25.10 Atherosclerotic heart disease of native coronary artery without angina pectoris Facility Procedures : CPT4 Code: [id number] Description: 99213 - WOUND CARE VISIT-LEV 3 EST PT Modifier: Quantity: 1 Physician Procedures : CPT4 Code Description Modifier [id number] 99213 - WC PHYS LEVEL 3 - EST PT ICD-10 Diagnosis Description M86.68 Other chronic osteomyelitis, other site L89.314 Pressure ulcer of right buttock, stage 4 Q76.0 Spina bifida occulta Z93.3 Colostomy status Quantity: 1 Electronic Signature(s) Signed: 06/16/2023 3:42:16 PM By: [name] RN Signed: 06/16/2023 4:31:22 PM By: Chase Derry PA-C Previous Signature: 06/16/2023 3:36:37 PM Version By: Chase Derry PA-C Entered By: [name] on 06/16/2023 12:42:15

## 2023-06-22 ENCOUNTER — Ambulatory Visit: Payer: 59 | Admitting: Internal Medicine

## 2023-07-12 ENCOUNTER — Encounter: Payer: 59 | Attending: Physician Assistant | Admitting: Physician Assistant

## 2023-07-12 DIAGNOSIS — L89314 Pressure ulcer of right buttock, stage 4: Secondary | ICD-10-CM | POA: Insufficient documentation

## 2023-07-12 DIAGNOSIS — L89323 Pressure ulcer of left buttock, stage 3: Secondary | ICD-10-CM | POA: Diagnosis not present

## 2023-07-12 DIAGNOSIS — M8668 Other chronic osteomyelitis, other site: Secondary | ICD-10-CM | POA: Diagnosis present

## 2023-07-12 DIAGNOSIS — Q760 Spina bifida occulta: Secondary | ICD-10-CM | POA: Insufficient documentation

## 2023-07-12 DIAGNOSIS — N183 Chronic kidney disease, stage 3 unspecified: Secondary | ICD-10-CM | POA: Diagnosis not present

## 2023-07-12 DIAGNOSIS — Z8616 Personal history of COVID-19: Secondary | ICD-10-CM | POA: Diagnosis not present

## 2023-07-12 DIAGNOSIS — I129 Hypertensive chronic kidney disease with stage 1 through stage 4 chronic kidney disease, or unspecified chronic kidney disease: Secondary | ICD-10-CM | POA: Diagnosis not present

## 2023-07-12 DIAGNOSIS — Z933 Colostomy status: Secondary | ICD-10-CM | POA: Insufficient documentation

## 2023-07-12 DIAGNOSIS — G8220 Paraplegia, unspecified: Secondary | ICD-10-CM | POA: Diagnosis not present

## 2023-07-12 DIAGNOSIS — I2510 Atherosclerotic heart disease of native coronary artery without angina pectoris: Secondary | ICD-10-CM | POA: Diagnosis not present

## 2023-07-12 NOTE — Progress Notes (Addendum)
patient continue to monitor for any evidence of infection or worsening. Based on what I am seeing I do believe that there is a definite issue here with  the dressing staying in place I think the Dakin's moistened gauze dressing is the right way to go also believe that the patient should continue with the Zetuvit bordered foam dressing or ABD pad secured with tape to follow. 2. I am going to recommend continued and appropriate offloading I think there is enough of this happening and I discussed that with the patient the more that he is on this I explained the worst this is gena be. With that being said he voiced understanding in that regard but at the same time even though he understands it still remains to be that he continues to lose the dressings and not have them staying in place like they need to be. This is going lead to at some point infection which is getting up worse. We will see patient back for reevaluation in 1 week here in the clinic. If anything worsens or changes patient will contact our office for additional recommendations. Electronic Signature(s) Signed: 07/12/2023 3:47:10 PM By: Chase Derry PA-C Entered By: [name] on 07/12/2023 12:47:10 [name] ([id number]) [id number] Page 11 of 11 -------------------------------------------------------------------------------- SuperBill Details Patient Name: Date of ServiceEulas [name] [facility], [name] 07/12/2023 Medical Record Number: [id number] Patient Account Number: [id number] Date of Birth/Sex: Treating RN: 1965/10/14 (56 y.o. [name]) [name] Primary Care Provider: Aram [name] Other Clinician: Referring Provider: Treating Provider/Extender: [name] in Treatment: 14 Diagnosis Coding ICD-10 Codes Code Description [phone] Other chronic osteomyelitis, other site L89.314 Pressure ulcer of right buttock, stage 4 Q76.0 Spina bifida occulta Z93.3 Colostomy status I10 Essential (primary) hypertension I25.10 Atherosclerotic heart disease of native coronary artery without angina pectoris Facility Procedures : CPT4 Code:  [id number] Description: 99213 - WOUND CARE VISIT-LEV 3 EST PT Modifier: Quantity: 1 Physician Procedures : CPT4 Code Description Modifier [id number] 99213 - WC PHYS LEVEL 3 - EST PT ICD-10 Diagnosis Description M86.68 Other chronic osteomyelitis, other site L89.314 Pressure ulcer of right buttock, stage 4 Q76.0 Spina bifida occulta Z93.3 Colostomy status Quantity: 1 Electronic Signature(s) Signed: 07/12/2023 3:47:51 PM By: Chase Derry PA-C Previous Signature: 07/12/2023 3:16:11 PM Version By: Chase Pax RN Entered By: [name] on 07/12/2023 12:47:51  [name] Other Clinician: Referring Provider: Treating Provider/Extender: [name] Weeks in Treatment: 14 Constitutional Well-nourished and well-hydrated in no acute distress. Respiratory normal breathing without difficulty. Psychiatric this patient is able to make decisions and demonstrates good insight into disease process. Alert and Oriented x 3. pleasant and cooperative. Notes Upon inspection patient's wound bed actually showed signs of poor granulation and epithelization at this point. Fortunately I do not see any evidence of systemic infection the locally I also do not think it is infected but I do not believe it is doing as well as it could be there is evidence still of friction and pressure and is not even keeping the dressings in place which is also of concern. I discussed all this with the patient today. Electronic Signature(s) Signed: 07/12/2023 3:45:48 PM By: Chase Derry PA-C Entered By: [name] on 07/12/2023 12:45:48 -------------------------------------------------------------------------------- Physician Orders Details Patient Name: Date of Service: [name] TH, [name] 07/12/2023 2:30 PM Medical Record Number: [id number] Patient Account Number: [id number] Date of Birth/Sex: Treating RN: 1966/04/24 (56 y.o. [name] Primary Care Provider: Aram [name] Other Clinician: Referring  Provider: Treating Provider/Extender: [name] in Treatment: [phone] Verbal / Phone Orders: No Diagnosis Coding ICD-10 Coding Code Description M86.68 Other chronic osteomyelitis, other site L89.314 Pressure ulcer of right buttock, stage 4 Q76.0 Spina bifida occulta Z93.3 Colostomy status [name], Chase ([id number]) [id number] Page 5 of 11 I10 Essential (primary) hypertension I25.10 Atherosclerotic heart disease of native coronary artery without angina pectoris Follow-up Appointments Return Appointment in 1 week. Bathing/ [employer] wounds with antibacterial soap and water. Anesthetic (Use 'Patient Medications' Section for Anesthetic Order Entry) Lidocaine applied to wound bed Off-Loading Gel wheelchair cushion Hospital bed/mattress - ETHOS Low air-loss mattress (Group 2) - HOSPITAL BED WITH GROUPE 2 AIR MATTRESS AND TRAPEZE BAR ETHOS Turn and reposition every 2 hours Wound Treatment Wound #5 - Gluteus Wound Laterality: Right, Midline Cleanser: Byram Ancillary Kit - 15 Day Supply (Generic) 3 x Per Week/30 Days Discharge Instructions: Use supplies as instructed; Kit contains: (15) Saline Bullets; (15) 3x3 Gauze; 15 pr Gloves Prim Dressing: Gauze 3 x Per Week/30 Days ary Discharge Instructions: moistened with Dakins Solution Secondary Dressing: (BORDER) Zetuvit Plus SILICONE BORDER Dressing 4x4 (in/in) (Generic) 3 x Per Week/30 Days Discharge Instructions: Please do not put silicone bordered dressings under wraps. Use non-bordered dressing only. Electronic Signature(s) Signed: 07/13/2023 4:58:41 PM By: Chase Derry PA-C Signed: 08/01/2023 8:01:26 AM By: Chase Pax RN Previous Signature: 07/12/2023 3:00:50 PM Version By: Chase Pax RN Previous Signature: 07/12/2023 4:49:45 PM Version By: Chase Derry PA-C Entered By: [name] on 07/13/2023 10:18:35 Prescription  07/12/2023 -------------------------------------------------------------------------------- [name] PA-C Patient Name: Provider: 07-Dec-1965 [id number] Date of Birth: NPI#: [id number] Sex: DEA #: [phone] Phone #: License #: UPN: Patient Address: [name] [facility] [address] [facility] [place], [place] [address] [address], [address] [place], [place] [address] [phone] Allergies Iodinated Contrast Media; Sulfa (Sulfonamide Antibiotics); latex; morphine Provider's Orders Hospital bed/mattress - ETHOS Hand Signature: Date(s): DAMONEY, WOZNY ([id number]) [id number] Page 6 of 11 Prescription 07/12/2023 [name] PA-C Patient Name: Provider: April 07, 1966 [id number] Date of Birth: NPI#: [name] [id number] Sex: DEA #: [phone] Phone #: License #: UPN: Patient Address: [address]  [facility] [address] [facility] [place], [place] [address] [address]  [name] Other Clinician: Referring Provider: Treating Provider/Extender: [name] Weeks in Treatment: 14 Constitutional Well-nourished and well-hydrated in no acute distress. Respiratory normal breathing without difficulty. Psychiatric this patient is able to make decisions and demonstrates good insight into disease process. Alert and Oriented x 3. pleasant and cooperative. Notes Upon inspection patient's wound bed actually showed signs of poor granulation and epithelization at this point. Fortunately I do not see any evidence of systemic infection the locally I also do not think it is infected but I do not believe it is doing as well as it could be there is evidence still of friction and pressure and is not even keeping the dressings in place which is also of concern. I discussed all this with the patient today. Electronic Signature(s) Signed: 07/12/2023 3:45:48 PM By: Chase Derry PA-C Entered By: [name] on 07/12/2023 12:45:48 -------------------------------------------------------------------------------- Physician Orders Details Patient Name: Date of Service: [name] TH, [name] 07/12/2023 2:30 PM Medical Record Number: [id number] Patient Account Number: [id number] Date of Birth/Sex: Treating RN: 1966/04/24 (56 y.o. [name] Primary Care Provider: Aram [name] Other Clinician: Referring  Provider: Treating Provider/Extender: [name] in Treatment: [phone] Verbal / Phone Orders: No Diagnosis Coding ICD-10 Coding Code Description M86.68 Other chronic osteomyelitis, other site L89.314 Pressure ulcer of right buttock, stage 4 Q76.0 Spina bifida occulta Z93.3 Colostomy status [name], Chase ([id number]) [id number] Page 5 of 11 I10 Essential (primary) hypertension I25.10 Atherosclerotic heart disease of native coronary artery without angina pectoris Follow-up Appointments Return Appointment in 1 week. Bathing/ [employer] wounds with antibacterial soap and water. Anesthetic (Use 'Patient Medications' Section for Anesthetic Order Entry) Lidocaine applied to wound bed Off-Loading Gel wheelchair cushion Hospital bed/mattress - ETHOS Low air-loss mattress (Group 2) - HOSPITAL BED WITH GROUPE 2 AIR MATTRESS AND TRAPEZE BAR ETHOS Turn and reposition every 2 hours Wound Treatment Wound #5 - Gluteus Wound Laterality: Right, Midline Cleanser: Byram Ancillary Kit - 15 Day Supply (Generic) 3 x Per Week/30 Days Discharge Instructions: Use supplies as instructed; Kit contains: (15) Saline Bullets; (15) 3x3 Gauze; 15 pr Gloves Prim Dressing: Gauze 3 x Per Week/30 Days ary Discharge Instructions: moistened with Dakins Solution Secondary Dressing: (BORDER) Zetuvit Plus SILICONE BORDER Dressing 4x4 (in/in) (Generic) 3 x Per Week/30 Days Discharge Instructions: Please do not put silicone bordered dressings under wraps. Use non-bordered dressing only. Electronic Signature(s) Signed: 07/13/2023 4:58:41 PM By: Chase Derry PA-C Signed: 08/01/2023 8:01:26 AM By: Chase Pax RN Previous Signature: 07/12/2023 3:00:50 PM Version By: Chase Pax RN Previous Signature: 07/12/2023 4:49:45 PM Version By: Chase Derry PA-C Entered By: [name] on 07/13/2023 10:18:35 Prescription  07/12/2023 -------------------------------------------------------------------------------- [name] PA-C Patient Name: Provider: 07-Dec-1965 [id number] Date of Birth: NPI#: [id number] Sex: DEA #: [phone] Phone #: License #: UPN: Patient Address: [name] [facility] [address] [facility] [place], [place] [address] [address], [address] [place], [place] [address] [phone] Allergies Iodinated Contrast Media; Sulfa (Sulfonamide Antibiotics); latex; morphine Provider's Orders Hospital bed/mattress - ETHOS Hand Signature: Date(s): DAMONEY, WOZNY ([id number]) [id number] Page 6 of 11 Prescription 07/12/2023 [name] PA-C Patient Name: Provider: April 07, 1966 [id number] Date of Birth: NPI#: [name] [id number] Sex: DEA #: [phone] Phone #: License #: UPN: Patient Address: [address]  [facility] [address] [facility] [place], [place] [address] [address]  [place], [name] ([id number]) [id number] Page 1 of 11 Visit Report for 07/12/2023 Chief Complaint Document Details Patient Name: Date of Service: [name] [facility], [name] 07/12/2023 2:30 PM Medical Record Number: [id number] Patient Account Number: [id number] Date of Birth/Sex: Treating RN: Sep 14, 1966 (56 y.o. [name]) [name] Primary Care Provider: Aram [name] Other Clinician: Referring Provider: Treating Provider/Extender: [name] in Treatment: 14 Information Obtained from: Patient Chief Complaint Right ischial tuberosity pressure ulcer with chronic osteomyelitis Electronic Signature(s) Signed: 07/12/2023 2:47:34 PM By: Chase Derry PA-C Entered By: [name] on 07/12/2023 11:47:34 -------------------------------------------------------------------------------- HPI Details Patient Name: Date of Service: [name] TH, [name] 07/12/2023 2:30 PM Medical Record Number: [id number] Patient Account Number: [id number] Date of Birth/Sex: Treating RN: [birth year] (56 y.o. [name] Primary Care Provider: Aram [name] Other Clinician: Referring Provider: Treating Provider/Extender: [name] in Treatment: 14 History of Present Illness HPI Description: 57 year old male with a history of spina bifida presenting to [place] with a history of a open wound on the left gluteal region near his upper thigh which she's had for several weeks. He was seen in the ED recently at [facility] and was put on doxycycline and was advised some local care. his past medical history is significant for spinal bifid, neurogenic bladder, kidney stones, sacral pressure sores, constipation. Past surgical history significant for partial cystectomy, ileal conduit, VP shunt removal, percutaneous nephro lithotripsy. In the remote past the patient says he's had some treatment for a ulcer on this area and was treated with a skin  graft. Readmission: 10/16/2021 upon evaluation today patient appears to be doing somewhat poorly in regard to a fairly large wound noted over the right ischial tuberosity location. This is a stage IV pressure ulcer. Of note this is also positive for osteomyelitis upon a CT scan which was performed during the time that he was admitted in the hospital on 09/19/2021. With that being said it is noted in the right inferior buttock that he has a decubitus ulcer which extends to the posterior toe inferior right ischium where there is evidence of osteomyelitis. When he was here in the office actually missed that this was positive I missed read it and thought it said that there was no osteomyelitis. That is not the case there is in fact osteomyelitis noted here. I actually did review his notes as well in epic. Looking to the discharge summary it appears that the patient was admitted from 09/19/2021 through 09/23/2021. He was discharged home with home health care according to the note. With that being said from what I understand his sister actually takes care of him at this point. He was also to follow-up with a primary care provider and here at the wound care center within a week. I am just now seeing him on the 13th almost a [name], [name] ([id number]) [id number] Page 2 of 11 month after discharge. He does have a history of again osteomyelitis of this right hip/ischial tuberosity location. 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 The reason for his admission was actually worsening of the decubitus ulcer upon admission. He does have chronic osteomyelitis of this area. He was treated with empiric antibiotics he had acute kidney injury which improved with IV fluids he also had hypokalemia. Surgical debridement was performed by general  [address] [place], [place] [address] [phone] Allergies Iodinated Contrast Media; Sulfa (Sulfonamide Antibiotics); latex; morphine Provider's Orders Low air-loss mattress (Group 2) - HOSPITAL BED WITH GROUPE 2 AIR MATTRESS AND TRAPEZE BAR ETHOS Hand Signature: Date(s): Electronic Signature(s) Signed: 07/13/2023 4:58:41 PM By: Chase Derry PA-C Signed: 08/01/2023 8:01:26 AM By: Chase Pax RN Entered By: [name] on 07/13/2023 10:18:36 -------------------------------------------------------------------------------- Problem List Details Patient Name: Date of Service: [name] TH, [name] 07/12/2023 2:30 PM Medical Record Number: [id number] Patient Account Number: [id number] Date of Birth/Sex: Treating RN: [birth year] (56 y.o. [name] Primary Care Provider: Aram [name] Other  Clinician: Referring Provider: Treating Provider/Extender: [name] Weeks in Treatment: 14 Active Problems ICD-10 Encounter Code Description Active Date MDM Diagnosis M86.68 Other chronic osteomyelitis, other site 05/19/2023 No Yes L89.314 Pressure ulcer of right buttock, stage 4 03/31/2023 No Yes Q76.0 Spina bifida occulta 03/31/2023 No Yes Z93.3 Colostomy status 03/31/2023 No Yes I10 Essential (primary) hypertension 03/31/2023 No Yes [name], [name] ([id number]) [id number] Page 7 of 11 I25.10 Atherosclerotic heart disease of native coronary artery without angina pectoris 03/31/2023 No Yes Inactive Problems Resolved Problems Electronic Signature(s) Signed: 07/12/2023 2:47:29 PM By: Chase Derry PA-C Entered By: [name] on 07/12/2023 11:47:29 -------------------------------------------------------------------------------- Progress Note Details Patient Name: Date of Service: [name] TH, [name] 07/12/2023 2:30 PM Medical Record Number: [id number] Patient Account Number: [id number] Date of Birth/Sex: Treating RN: 1966-07-30 (56 y.o. [name]) [name] Primary Care Provider: Aram [name] Other Clinician: Referring Provider: Treating Provider/Extender: [name] in Treatment: 14 Subjective Chief Complaint Information obtained from Patient Right ischial tuberosity pressure ulcer with chronic osteomyelitis History of Present Illness (HPI) 57 year old male with a history of spina bifida presenting to [place] with a history of a open wound on the left gluteal region near his upper thigh which she's had for several weeks. He was seen in the ED recently at [facility] and was put on doxycycline and was advised some local care. his past medical history is significant for spinal bifid, neurogenic bladder, kidney stones, sacral pressure sores, constipation. Past surgical history significant for partial cystectomy,  ileal conduit, VP shunt removal, percutaneous nephro lithotripsy. In the remote past the patient says he's had some treatment for a ulcer on this area and was treated with a skin graft. Readmission: 10/16/2021 upon evaluation today patient appears to be doing somewhat poorly in regard to a fairly large wound noted over the right ischial tuberosity location. This is a stage IV pressure ulcer. Of note this is also positive for osteomyelitis upon a CT scan which was performed during the time that he was admitted in the hospital on 09/19/2021. With that being said it is noted in the right inferior buttock that he has a decubitus ulcer which extends to the posterior toe inferior right ischium where there is evidence of osteomyelitis. When he was here in the office actually missed that this was positive I missed read it and thought it said that there was no osteomyelitis. That is not the case there is in fact osteomyelitis noted here. I actually did review his notes as well in epic. Looking to the discharge summary it appears that the patient was admitted from 09/19/2021 through 09/23/2021. He was discharged home with home health care according to the note. With that being said from what I understand his sister actually takes care of him at this point. He was also to follow-up with a primary care provider and  [address] [place], [place] [address] [phone] Allergies Iodinated Contrast Media; Sulfa (Sulfonamide Antibiotics); latex; morphine Provider's Orders Low air-loss mattress (Group 2) - HOSPITAL BED WITH GROUPE 2 AIR MATTRESS AND TRAPEZE BAR ETHOS Hand Signature: Date(s): Electronic Signature(s) Signed: 07/13/2023 4:58:41 PM By: Chase Derry PA-C Signed: 08/01/2023 8:01:26 AM By: Chase Pax RN Entered By: [name] on 07/13/2023 10:18:36 -------------------------------------------------------------------------------- Problem List Details Patient Name: Date of Service: [name] TH, [name] 07/12/2023 2:30 PM Medical Record Number: [id number] Patient Account Number: [id number] Date of Birth/Sex: Treating RN: [birth year] (56 y.o. [name] Primary Care Provider: Aram [name] Other  Clinician: Referring Provider: Treating Provider/Extender: [name] Weeks in Treatment: 14 Active Problems ICD-10 Encounter Code Description Active Date MDM Diagnosis M86.68 Other chronic osteomyelitis, other site 05/19/2023 No Yes L89.314 Pressure ulcer of right buttock, stage 4 03/31/2023 No Yes Q76.0 Spina bifida occulta 03/31/2023 No Yes Z93.3 Colostomy status 03/31/2023 No Yes I10 Essential (primary) hypertension 03/31/2023 No Yes [name], [name] ([id number]) [id number] Page 7 of 11 I25.10 Atherosclerotic heart disease of native coronary artery without angina pectoris 03/31/2023 No Yes Inactive Problems Resolved Problems Electronic Signature(s) Signed: 07/12/2023 2:47:29 PM By: Chase Derry PA-C Entered By: [name] on 07/12/2023 11:47:29 -------------------------------------------------------------------------------- Progress Note Details Patient Name: Date of Service: [name] TH, [name] 07/12/2023 2:30 PM Medical Record Number: [id number] Patient Account Number: [id number] Date of Birth/Sex: Treating RN: 1966-07-30 (56 y.o. [name]) [name] Primary Care Provider: Aram [name] Other Clinician: Referring Provider: Treating Provider/Extender: [name] in Treatment: 14 Subjective Chief Complaint Information obtained from Patient Right ischial tuberosity pressure ulcer with chronic osteomyelitis History of Present Illness (HPI) 57 year old male with a history of spina bifida presenting to [place] with a history of a open wound on the left gluteal region near his upper thigh which she's had for several weeks. He was seen in the ED recently at [facility] and was put on doxycycline and was advised some local care. his past medical history is significant for spinal bifid, neurogenic bladder, kidney stones, sacral pressure sores, constipation. Past surgical history significant for partial cystectomy,  ileal conduit, VP shunt removal, percutaneous nephro lithotripsy. In the remote past the patient says he's had some treatment for a ulcer on this area and was treated with a skin graft. Readmission: 10/16/2021 upon evaluation today patient appears to be doing somewhat poorly in regard to a fairly large wound noted over the right ischial tuberosity location. This is a stage IV pressure ulcer. Of note this is also positive for osteomyelitis upon a CT scan which was performed during the time that he was admitted in the hospital on 09/19/2021. With that being said it is noted in the right inferior buttock that he has a decubitus ulcer which extends to the posterior toe inferior right ischium where there is evidence of osteomyelitis. When he was here in the office actually missed that this was positive I missed read it and thought it said that there was no osteomyelitis. That is not the case there is in fact osteomyelitis noted here. I actually did review his notes as well in epic. Looking to the discharge summary it appears that the patient was admitted from 09/19/2021 through 09/23/2021. He was discharged home with home health care according to the note. With that being said from what I understand his sister actually takes care of him at this point. He was also to follow-up with a primary care provider and  [address] [place], [place] [address] [phone] Allergies Iodinated Contrast Media; Sulfa (Sulfonamide Antibiotics); latex; morphine Provider's Orders Low air-loss mattress (Group 2) - HOSPITAL BED WITH GROUPE 2 AIR MATTRESS AND TRAPEZE BAR ETHOS Hand Signature: Date(s): Electronic Signature(s) Signed: 07/13/2023 4:58:41 PM By: Chase Derry PA-C Signed: 08/01/2023 8:01:26 AM By: Chase Pax RN Entered By: [name] on 07/13/2023 10:18:36 -------------------------------------------------------------------------------- Problem List Details Patient Name: Date of Service: [name] TH, [name] 07/12/2023 2:30 PM Medical Record Number: [id number] Patient Account Number: [id number] Date of Birth/Sex: Treating RN: [birth year] (56 y.o. [name] Primary Care Provider: Aram [name] Other  Clinician: Referring Provider: Treating Provider/Extender: [name] Weeks in Treatment: 14 Active Problems ICD-10 Encounter Code Description Active Date MDM Diagnosis M86.68 Other chronic osteomyelitis, other site 05/19/2023 No Yes L89.314 Pressure ulcer of right buttock, stage 4 03/31/2023 No Yes Q76.0 Spina bifida occulta 03/31/2023 No Yes Z93.3 Colostomy status 03/31/2023 No Yes I10 Essential (primary) hypertension 03/31/2023 No Yes [name], [name] ([id number]) [id number] Page 7 of 11 I25.10 Atherosclerotic heart disease of native coronary artery without angina pectoris 03/31/2023 No Yes Inactive Problems Resolved Problems Electronic Signature(s) Signed: 07/12/2023 2:47:29 PM By: Chase Derry PA-C Entered By: [name] on 07/12/2023 11:47:29 -------------------------------------------------------------------------------- Progress Note Details Patient Name: Date of Service: [name] TH, [name] 07/12/2023 2:30 PM Medical Record Number: [id number] Patient Account Number: [id number] Date of Birth/Sex: Treating RN: 1966-07-30 (56 y.o. [name]) [name] Primary Care Provider: Aram [name] Other Clinician: Referring Provider: Treating Provider/Extender: [name] in Treatment: 14 Subjective Chief Complaint Information obtained from Patient Right ischial tuberosity pressure ulcer with chronic osteomyelitis History of Present Illness (HPI) 57 year old male with a history of spina bifida presenting to [place] with a history of a open wound on the left gluteal region near his upper thigh which she's had for several weeks. He was seen in the ED recently at [facility] and was put on doxycycline and was advised some local care. his past medical history is significant for spinal bifid, neurogenic bladder, kidney stones, sacral pressure sores, constipation. Past surgical history significant for partial cystectomy,  ileal conduit, VP shunt removal, percutaneous nephro lithotripsy. In the remote past the patient says he's had some treatment for a ulcer on this area and was treated with a skin graft. Readmission: 10/16/2021 upon evaluation today patient appears to be doing somewhat poorly in regard to a fairly large wound noted over the right ischial tuberosity location. This is a stage IV pressure ulcer. Of note this is also positive for osteomyelitis upon a CT scan which was performed during the time that he was admitted in the hospital on 09/19/2021. With that being said it is noted in the right inferior buttock that he has a decubitus ulcer which extends to the posterior toe inferior right ischium where there is evidence of osteomyelitis. When he was here in the office actually missed that this was positive I missed read it and thought it said that there was no osteomyelitis. That is not the case there is in fact osteomyelitis noted here. I actually did review his notes as well in epic. Looking to the discharge summary it appears that the patient was admitted from 09/19/2021 through 09/23/2021. He was discharged home with home health care according to the note. With that being said from what I understand his sister actually takes care of him at this point. He was also to follow-up with a primary care provider and  patient continue to monitor for any evidence of infection or worsening. Based on what I am seeing I do believe that there is a definite issue here with  the dressing staying in place I think the Dakin's moistened gauze dressing is the right way to go also believe that the patient should continue with the Zetuvit bordered foam dressing or ABD pad secured with tape to follow. 2. I am going to recommend continued and appropriate offloading I think there is enough of this happening and I discussed that with the patient the more that he is on this I explained the worst this is gena be. With that being said he voiced understanding in that regard but at the same time even though he understands it still remains to be that he continues to lose the dressings and not have them staying in place like they need to be. This is going lead to at some point infection which is getting up worse. We will see patient back for reevaluation in 1 week here in the clinic. If anything worsens or changes patient will contact our office for additional recommendations. Electronic Signature(s) Signed: 07/12/2023 3:47:10 PM By: Chase Derry PA-C Entered By: [name] on 07/12/2023 12:47:10 [name] ([id number]) [id number] Page 11 of 11 -------------------------------------------------------------------------------- SuperBill Details Patient Name: Date of ServiceEulas [name] [facility], [name] 07/12/2023 Medical Record Number: [id number] Patient Account Number: [id number] Date of Birth/Sex: Treating RN: 1965/10/14 (56 y.o. [name]) [name] Primary Care Provider: Aram [name] Other Clinician: Referring Provider: Treating Provider/Extender: [name] in Treatment: 14 Diagnosis Coding ICD-10 Codes Code Description [phone] Other chronic osteomyelitis, other site L89.314 Pressure ulcer of right buttock, stage 4 Q76.0 Spina bifida occulta Z93.3 Colostomy status I10 Essential (primary) hypertension I25.10 Atherosclerotic heart disease of native coronary artery without angina pectoris Facility Procedures : CPT4 Code:  [id number] Description: 99213 - WOUND CARE VISIT-LEV 3 EST PT Modifier: Quantity: 1 Physician Procedures : CPT4 Code Description Modifier [id number] 99213 - WC PHYS LEVEL 3 - EST PT ICD-10 Diagnosis Description M86.68 Other chronic osteomyelitis, other site L89.314 Pressure ulcer of right buttock, stage 4 Q76.0 Spina bifida occulta Z93.3 Colostomy status Quantity: 1 Electronic Signature(s) Signed: 07/12/2023 3:47:51 PM By: Chase Derry PA-C Previous Signature: 07/12/2023 3:16:11 PM Version By: Chase Pax RN Entered By: [name] on 07/12/2023 12:47:51  [address] [place], [place] [address] [phone] Allergies Iodinated Contrast Media; Sulfa (Sulfonamide Antibiotics); latex; morphine Provider's Orders Low air-loss mattress (Group 2) - HOSPITAL BED WITH GROUPE 2 AIR MATTRESS AND TRAPEZE BAR ETHOS Hand Signature: Date(s): Electronic Signature(s) Signed: 07/13/2023 4:58:41 PM By: Chase Derry PA-C Signed: 08/01/2023 8:01:26 AM By: Chase Pax RN Entered By: [name] on 07/13/2023 10:18:36 -------------------------------------------------------------------------------- Problem List Details Patient Name: Date of Service: [name] TH, [name] 07/12/2023 2:30 PM Medical Record Number: [id number] Patient Account Number: [id number] Date of Birth/Sex: Treating RN: [birth year] (56 y.o. [name] Primary Care Provider: Aram [name] Other  Clinician: Referring Provider: Treating Provider/Extender: [name] Weeks in Treatment: 14 Active Problems ICD-10 Encounter Code Description Active Date MDM Diagnosis M86.68 Other chronic osteomyelitis, other site 05/19/2023 No Yes L89.314 Pressure ulcer of right buttock, stage 4 03/31/2023 No Yes Q76.0 Spina bifida occulta 03/31/2023 No Yes Z93.3 Colostomy status 03/31/2023 No Yes I10 Essential (primary) hypertension 03/31/2023 No Yes [name], [name] ([id number]) [id number] Page 7 of 11 I25.10 Atherosclerotic heart disease of native coronary artery without angina pectoris 03/31/2023 No Yes Inactive Problems Resolved Problems Electronic Signature(s) Signed: 07/12/2023 2:47:29 PM By: Chase Derry PA-C Entered By: [name] on 07/12/2023 11:47:29 -------------------------------------------------------------------------------- Progress Note Details Patient Name: Date of Service: [name] TH, [name] 07/12/2023 2:30 PM Medical Record Number: [id number] Patient Account Number: [id number] Date of Birth/Sex: Treating RN: 1966-07-30 (56 y.o. [name]) [name] Primary Care Provider: Aram [name] Other Clinician: Referring Provider: Treating Provider/Extender: [name] in Treatment: 14 Subjective Chief Complaint Information obtained from Patient Right ischial tuberosity pressure ulcer with chronic osteomyelitis History of Present Illness (HPI) 57 year old male with a history of spina bifida presenting to [place] with a history of a open wound on the left gluteal region near his upper thigh which she's had for several weeks. He was seen in the ED recently at [facility] and was put on doxycycline and was advised some local care. his past medical history is significant for spinal bifid, neurogenic bladder, kidney stones, sacral pressure sores, constipation. Past surgical history significant for partial cystectomy,  ileal conduit, VP shunt removal, percutaneous nephro lithotripsy. In the remote past the patient says he's had some treatment for a ulcer on this area and was treated with a skin graft. Readmission: 10/16/2021 upon evaluation today patient appears to be doing somewhat poorly in regard to a fairly large wound noted over the right ischial tuberosity location. This is a stage IV pressure ulcer. Of note this is also positive for osteomyelitis upon a CT scan which was performed during the time that he was admitted in the hospital on 09/19/2021. With that being said it is noted in the right inferior buttock that he has a decubitus ulcer which extends to the posterior toe inferior right ischium where there is evidence of osteomyelitis. When he was here in the office actually missed that this was positive I missed read it and thought it said that there was no osteomyelitis. That is not the case there is in fact osteomyelitis noted here. I actually did review his notes as well in epic. Looking to the discharge summary it appears that the patient was admitted from 09/19/2021 through 09/23/2021. He was discharged home with home health care according to the note. With that being said from what I understand his sister actually takes care of him at this point. He was also to follow-up with a primary care provider and  location. Fortunately there does not appear to be any signs of systemic infection though he does have evidence of osteomyelitis noted which were acute on chronic and that was the findings that were noted in the hospital when he was admitted most recently after I sent him from May 12, 2023 through May 17, 2023. The patient was treated with IV Zosyn and bank while in the hospital and discharged on Augmentin although that has not been picked up yet it has been 2 days since he was discharged he states that "the pharmacy never called his sister to let her know it was ready". Nonetheless this needs to be gotten ASAP to make sure that he is fully treated as far as the infection is concerned I discussed that with him today as well. 05-26-2023 upon evaluation today patient appears to be doing better today with regard to his wounds compared to what we have noticed previous. Fortunately I do not see any evidence of active infection locally or  systemically which is great news and in general I do believe that we are making headway towards complete closure. With that being said we still had some trouble getting the order done for the trapeze and aids to help him with regard to his daily activities and preventing the dressings from coming off. We have reached out to his CAP Worker and have not gotten in touch with her as of yet. I did recommend that the patient try to see. He or his sister to get in touch with Angel[facility] and see if they can help [place] in that regard. 8/29; patient with 2 wounds in close juxtaposition on the right buttock. Both of these probes very close to the bone especially the area over the ischial tuberosity. Patient was recently in the hospital for additional antibiotics I did not look at this today. We are using Dakin's wet to dry. His sister changes in dressings. The wounds are measuring larger. 06-16-2023 upon evaluation today patient appears to be doing worse currently at this time. Fortunately I do not see any signs of infection which is good news but at the same time he does have deep tissue injury which has me concerned about potential further breakdown here. He does tell me that he has been up on it a little bit more. 07-12-2023 upon evaluation today patient appears to be doing okay in regard to his wound there is actually an area that is merged between the 2 and therefore this is really just 1 wound we converted it as such. There is no separation between the 2. I knew that this was going to happen it was just a matter of time. With that being said I do not see any signs of infection obvious at this point but I do believe that this is still an issue with him keeping the dressing in place he tells me it is not staying in place which does have me concerned as well. Fortunately I do not see any evidence of worsening overall systemically though locally I am a little concerned about how the wounds have digressed since I last  saw him. Electronic Signature(s) Signed: 07/12/2023 3:45:25 PM By: Antionette Fairy, [name] ([id number]) [id number] Page 4 of 11 Entered By: [name] on 07/12/2023 12:45:25 -------------------------------------------------------------------------------- Physical Exam Details Patient Name: Date of ServiceEulas [name] [facility], [name] 07/12/2023 2:30 PM Medical Record Number: [id number] Patient Account Number: [id number] Date of Birth/Sex: Treating RN: [birth year] (57 y.o. [name]) [name] Primary Care Provider:  [address] [place], [place] [address] [phone] Allergies Iodinated Contrast Media; Sulfa (Sulfonamide Antibiotics); latex; morphine Provider's Orders Low air-loss mattress (Group 2) - HOSPITAL BED WITH GROUPE 2 AIR MATTRESS AND TRAPEZE BAR ETHOS Hand Signature: Date(s): Electronic Signature(s) Signed: 07/13/2023 4:58:41 PM By: Chase Derry PA-C Signed: 08/01/2023 8:01:26 AM By: Chase Pax RN Entered By: [name] on 07/13/2023 10:18:36 -------------------------------------------------------------------------------- Problem List Details Patient Name: Date of Service: [name] TH, [name] 07/12/2023 2:30 PM Medical Record Number: [id number] Patient Account Number: [id number] Date of Birth/Sex: Treating RN: [birth year] (56 y.o. [name] Primary Care Provider: Aram [name] Other  Clinician: Referring Provider: Treating Provider/Extender: [name] Weeks in Treatment: 14 Active Problems ICD-10 Encounter Code Description Active Date MDM Diagnosis M86.68 Other chronic osteomyelitis, other site 05/19/2023 No Yes L89.314 Pressure ulcer of right buttock, stage 4 03/31/2023 No Yes Q76.0 Spina bifida occulta 03/31/2023 No Yes Z93.3 Colostomy status 03/31/2023 No Yes I10 Essential (primary) hypertension 03/31/2023 No Yes [name], [name] ([id number]) [id number] Page 7 of 11 I25.10 Atherosclerotic heart disease of native coronary artery without angina pectoris 03/31/2023 No Yes Inactive Problems Resolved Problems Electronic Signature(s) Signed: 07/12/2023 2:47:29 PM By: Chase Derry PA-C Entered By: [name] on 07/12/2023 11:47:29 -------------------------------------------------------------------------------- Progress Note Details Patient Name: Date of Service: [name] TH, [name] 07/12/2023 2:30 PM Medical Record Number: [id number] Patient Account Number: [id number] Date of Birth/Sex: Treating RN: 1966-07-30 (56 y.o. [name]) [name] Primary Care Provider: Aram [name] Other Clinician: Referring Provider: Treating Provider/Extender: [name] in Treatment: 14 Subjective Chief Complaint Information obtained from Patient Right ischial tuberosity pressure ulcer with chronic osteomyelitis History of Present Illness (HPI) 57 year old male with a history of spina bifida presenting to [place] with a history of a open wound on the left gluteal region near his upper thigh which she's had for several weeks. He was seen in the ED recently at [facility] and was put on doxycycline and was advised some local care. his past medical history is significant for spinal bifid, neurogenic bladder, kidney stones, sacral pressure sores, constipation. Past surgical history significant for partial cystectomy,  ileal conduit, VP shunt removal, percutaneous nephro lithotripsy. In the remote past the patient says he's had some treatment for a ulcer on this area and was treated with a skin graft. Readmission: 10/16/2021 upon evaluation today patient appears to be doing somewhat poorly in regard to a fairly large wound noted over the right ischial tuberosity location. This is a stage IV pressure ulcer. Of note this is also positive for osteomyelitis upon a CT scan which was performed during the time that he was admitted in the hospital on 09/19/2021. With that being said it is noted in the right inferior buttock that he has a decubitus ulcer which extends to the posterior toe inferior right ischium where there is evidence of osteomyelitis. When he was here in the office actually missed that this was positive I missed read it and thought it said that there was no osteomyelitis. That is not the case there is in fact osteomyelitis noted here. I actually did review his notes as well in epic. Looking to the discharge summary it appears that the patient was admitted from 09/19/2021 through 09/23/2021. He was discharged home with home health care according to the note. With that being said from what I understand his sister actually takes care of him at this point. He was also to follow-up with a primary care provider and  location. Fortunately there does not appear to be any signs of systemic infection though he does have evidence of osteomyelitis noted which were acute on chronic and that was the findings that were noted in the hospital when he was admitted most recently after I sent him from May 12, 2023 through May 17, 2023. The patient was treated with IV Zosyn and bank while in the hospital and discharged on Augmentin although that has not been picked up yet it has been 2 days since he was discharged he states that "the pharmacy never called his sister to let her know it was ready". Nonetheless this needs to be gotten ASAP to make sure that he is fully treated as far as the infection is concerned I discussed that with him today as well. 05-26-2023 upon evaluation today patient appears to be doing better today with regard to his wounds compared to what we have noticed previous. Fortunately I do not see any evidence of active infection locally or  systemically which is great news and in general I do believe that we are making headway towards complete closure. With that being said we still had some trouble getting the order done for the trapeze and aids to help him with regard to his daily activities and preventing the dressings from coming off. We have reached out to his CAP Worker and have not gotten in touch with her as of yet. I did recommend that the patient try to see. He or his sister to get in touch with [facility] and see if they can help [place] in that regard. 8/29; patient with 2 wounds in close juxtaposition on the right buttock. Both of these probes very close to the bone especially the area over the ischial tuberosity. Patient was recently in the hospital for additional antibiotics I did not look at this today. We are using Dakin's wet to dry. His sister changes in dressings. The wounds are measuring larger. 06-16-2023 upon evaluation today patient appears to be doing worse currently at this time. Fortunately I do not see any signs of infection which is good news but at the same time he does have deep tissue injury which has me concerned about potential further breakdown here. He does tell me that he has been up on it a little bit more. 07-12-2023 upon evaluation today patient appears to be doing okay in regard to his wound there is actually an area that is merged between the 2 and therefore this is really just 1 wound we converted it as such. There is no separation between the 2. I knew that this was going to happen it was just a matter of time. With that being said I do not see any signs of infection obvious at this point but I do believe that this is still an issue with him keeping the dressing in place he tells me it is not staying in place which does have me concerned as well. Fortunately I do not see any evidence of worsening overall systemically though locally I am a little concerned about how the wounds have digressed since I last  saw him. Electronic Signature(s) Signed: 07/12/2023 3:45:25 PM By: Antionette Fairy, [name] ([id number]) [id number] Page 4 of 11 Entered By: [name] on 07/12/2023 12:45:25 -------------------------------------------------------------------------------- Physical Exam Details Patient Name: Date of ServiceEulas [name] [facility], [name] 07/12/2023 2:30 PM Medical Record Number: [id number] Patient Account Number: [id number] Date of Birth/Sex: Treating RN: [birth year] (57 y.o. [name]) [name] Primary Care Provider:  patient continue to monitor for any evidence of infection or worsening. Based on what I am seeing I do believe that there is a definite issue here with  the dressing staying in place I think the Dakin's moistened gauze dressing is the right way to go also believe that the patient should continue with the Zetuvit bordered foam dressing or ABD pad secured with tape to follow. 2. I am going to recommend continued and appropriate offloading I think there is enough of this happening and I discussed that with the patient the more that he is on this I explained the worst this is gena be. With that being said he voiced understanding in that regard but at the same time even though he understands it still remains to be that he continues to lose the dressings and not have them staying in place like they need to be. This is going lead to at some point infection which is getting up worse. We will see patient back for reevaluation in 1 week here in the clinic. If anything worsens or changes patient will contact our office for additional recommendations. Electronic Signature(s) Signed: 07/12/2023 3:47:10 PM By: Chase Derry PA-C Entered By: [name] on 07/12/2023 12:47:10 [name] ([id number]) [id number] Page 11 of 11 -------------------------------------------------------------------------------- SuperBill Details Patient Name: Date of ServiceEulas [name] [facility], [name] 07/12/2023 Medical Record Number: [id number] Patient Account Number: [id number] Date of Birth/Sex: Treating RN: 1965/10/14 (56 y.o. [name]) [name] Primary Care Provider: Aram [name] Other Clinician: Referring Provider: Treating Provider/Extender: [name] in Treatment: 14 Diagnosis Coding ICD-10 Codes Code Description [phone] Other chronic osteomyelitis, other site L89.314 Pressure ulcer of right buttock, stage 4 Q76.0 Spina bifida occulta Z93.3 Colostomy status I10 Essential (primary) hypertension I25.10 Atherosclerotic heart disease of native coronary artery without angina pectoris Facility Procedures : CPT4 Code:  [id number] Description: 99213 - WOUND CARE VISIT-LEV 3 EST PT Modifier: Quantity: 1 Physician Procedures : CPT4 Code Description Modifier [id number] 99213 - WC PHYS LEVEL 3 - EST PT ICD-10 Diagnosis Description M86.68 Other chronic osteomyelitis, other site L89.314 Pressure ulcer of right buttock, stage 4 Q76.0 Spina bifida occulta Z93.3 Colostomy status Quantity: 1 Electronic Signature(s) Signed: 07/12/2023 3:47:51 PM By: Chase Derry PA-C Previous Signature: 07/12/2023 3:16:11 PM Version By: Chase Pax RN Entered By: [name] on 07/12/2023 12:47:51  location. Fortunately there does not appear to be any signs of systemic infection though he does have evidence of osteomyelitis noted which were acute on chronic and that was the findings that were noted in the hospital when he was admitted most recently after I sent him from May 12, 2023 through May 17, 2023. The patient was treated with IV Zosyn and bank while in the hospital and discharged on Augmentin although that has not been picked up yet it has been 2 days since he was discharged he states that "the pharmacy never called his sister to let her know it was ready". Nonetheless this needs to be gotten ASAP to make sure that he is fully treated as far as the infection is concerned I discussed that with him today as well. 05-26-2023 upon evaluation today patient appears to be doing better today with regard to his wounds compared to what we have noticed previous. Fortunately I do not see any evidence of active infection locally or  systemically which is great news and in general I do believe that we are making headway towards complete closure. With that being said we still had some trouble getting the order done for the trapeze and aids to help him with regard to his daily activities and preventing the dressings from coming off. We have reached out to his CAP Worker and have not gotten in touch with her as of yet. I did recommend that the patient try to see. He or his sister to get in touch with [facility] and see if they can help [place] in that regard. 8/29; patient with 2 wounds in close juxtaposition on the right buttock. Both of these probes very close to the bone especially the area over the ischial tuberosity. Patient was recently in the hospital for additional antibiotics I did not look at this today. We are using Dakin's wet to dry. His sister changes in dressings. The wounds are measuring larger. 06-16-2023 upon evaluation today patient appears to be doing worse currently at this time. Fortunately I do not see any signs of infection which is good news but at the same time he does have deep tissue injury which has me concerned about potential further breakdown here. He does tell me that he has been up on it a little bit more. 07-12-2023 upon evaluation today patient appears to be doing okay in regard to his wound there is actually an area that is merged between the 2 and therefore this is really just 1 wound we converted it as such. There is no separation between the 2. I knew that this was going to happen it was just a matter of time. With that being said I do not see any signs of infection obvious at this point but I do believe that this is still an issue with him keeping the dressing in place he tells me it is not staying in place which does have me concerned as well. Fortunately I do not see any evidence of worsening overall systemically though locally I am a little concerned about how the wounds have digressed since I last  saw him. Electronic Signature(s) Signed: 07/12/2023 3:45:25 PM By: Antionette Fairy, [name] ([id number]) [id number] Page 4 of 11 Entered By: [name] on 07/12/2023 12:45:25 -------------------------------------------------------------------------------- Physical Exam Details Patient Name: [name] 07/12/2023 2:30 PM Medical Record Number: [id number] Patient Account Number: [id number] Date of Birth/Sex: Treating RN: [birth year] (57 y.o. [name]) [name] Primary Care Provider:  [place], [name] ([id number]) [id number] Page 1 of 11 Visit Report for 07/12/2023 Chief Complaint Document Details Patient Name: Date of Service: [name] [facility], [name] 07/12/2023 2:30 PM Medical Record Number: [id number] Patient Account Number: [id number] Date of Birth/Sex: Treating RN: Sep 14, 1966 (56 y.o. [name]) [name] Primary Care Provider: Aram [name] Other Clinician: Referring Provider: Treating Provider/Extender: [name] in Treatment: 14 Information Obtained from: Patient Chief Complaint Right ischial tuberosity pressure ulcer with chronic osteomyelitis Electronic Signature(s) Signed: 07/12/2023 2:47:34 PM By: Chase Derry PA-C Entered By: [name] on 07/12/2023 11:47:34 -------------------------------------------------------------------------------- HPI Details Patient Name: Date of Service: [name] [facility], [name] 07/12/2023 2:30 PM Medical Record Number: [id number] Patient Account Number: [id number] Date of Birth/Sex: Treating RN: [birth year] (56 y.o. [name] Primary Care Provider: Aram [name] Other Clinician: Referring Provider: Treating Provider/Extender: [name] in Treatment: 14 History of Present Illness HPI Description: 57 year old male with a history of spina bifida presenting to [place] with a history of a open wound on the left gluteal region near his upper thigh which she's had for several weeks. He was seen in the ED recently at [facility] and was put on doxycycline and was advised some local care. his past medical history is significant for spinal bifid, neurogenic bladder, kidney stones, sacral pressure sores, constipation. Past surgical history significant for partial cystectomy, ileal conduit, VP shunt removal, percutaneous nephro lithotripsy. In the remote past the patient says he's had some treatment for a ulcer on this area and was treated with a skin  graft. Readmission: 10/16/2021 upon evaluation today patient appears to be doing somewhat poorly in regard to a fairly large wound noted over the right ischial tuberosity location. This is a stage IV pressure ulcer. Of note this is also positive for osteomyelitis upon a CT scan which was performed during the time that he was admitted in the hospital on 09/19/2021. With that being said it is noted in the right inferior buttock that he has a decubitus ulcer which extends to the posterior toe inferior right ischium where there is evidence of osteomyelitis. When he was here in the office actually missed that this was positive I missed read it and thought it said that there was no osteomyelitis. That is not the case there is in fact osteomyelitis noted here. I actually did review his notes as well in epic. Looking to the discharge summary it appears that the patient was admitted from 09/19/2021 through 09/23/2021. He was discharged home with home health care according to the note. With that being said from what I understand his sister actually takes care of him at this point. He was also to follow-up with a primary care provider and here at the wound care center within a week. I am just now seeing him on the 13th almost a [name], [name] ([id number]) [id number] Page 2 of 11 month after discharge. He does have a history of again osteomyelitis of this right hip/ischial tuberosity location. 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 The reason for his admission was actually worsening of the decubitus ulcer upon admission. He does have chronic osteomyelitis of this area. He was treated with empiric antibiotics he had acute kidney injury which improved with IV fluids he also had hypokalemia. Surgical debridement was performed by general  location. Fortunately there does not appear to be any signs of systemic infection though he does have evidence of osteomyelitis noted which were acute on chronic and that was the findings that were noted in the hospital when he was admitted most recently after I sent him from May 12, 2023 through May 17, 2023. The patient was treated with IV Zosyn and bank while in the hospital and discharged on Augmentin although that has not been picked up yet it has been 2 days since he was discharged he states that "the pharmacy never called his sister to let her know it was ready". Nonetheless this needs to be gotten ASAP to make sure that he is fully treated as far as the infection is concerned I discussed that with him today as well. 05-26-2023 upon evaluation today patient appears to be doing better today with regard to his wounds compared to what we have noticed previous. Fortunately I do not see any evidence of active infection locally or  systemically which is great news and in general I do believe that we are making headway towards complete closure. With that being said we still had some trouble getting the order done for the trapeze and aids to help him with regard to his daily activities and preventing the dressings from coming off. We have reached out to his CAP Worker and have not gotten in touch with her as of yet. I did recommend that the patient try to see. He or his sister to get in touch with [facility] and see if they can help [place] in that regard. 8/29; patient with 2 wounds in close juxtaposition on the right buttock. Both of these probes very close to the bone especially the area over the ischial tuberosity. Patient was recently in the hospital for additional antibiotics I did not look at this today. We are using Dakin's wet to dry. His sister changes in dressings. The wounds are measuring larger. 06-16-2023 upon evaluation today patient appears to be doing worse currently at this time. Fortunately I do not see any signs of infection which is good news but at the same time he does have deep tissue injury which has me concerned about potential further breakdown here. He does tell me that he has been up on it a little bit more. 07-12-2023 upon evaluation today patient appears to be doing okay in regard to his wound there is actually an area that is merged between the 2 and therefore this is really just 1 wound we converted it as such. There is no separation between the 2. I knew that this was going to happen it was just a matter of time. With that being said I do not see any signs of infection obvious at this point but I do believe that this is still an issue with him keeping the dressing in place he tells me it is not staying in place which does have me concerned as well. Fortunately I do not see any evidence of worsening overall systemically though locally I am a little concerned about how the wounds have digressed since I last  saw him. Electronic Signature(s) Signed: 07/12/2023 3:45:25 PM By: Antionette Fairy, [name] ([id number]) [id number] Page 4 of 11 Entered By: [name] on 07/12/2023 12:45:25 -------------------------------------------------------------------------------- Physical Exam Details Patient Name: Date of ServiceEulas [name] [facility], [name] 07/12/2023 2:30 PM Medical Record Number: [id number] Patient Account Number: [id number] Date of Birth/Sex: Treating RN: [birth year] (57 y.o. [name]) [name] Primary Care Provider:  [place], [name] ([id number]) [id number] Page 1 of 11 Visit Report for 07/12/2023 Chief Complaint Document Details Patient Name: Date of Service: [name] [facility], [name] 07/12/2023 2:30 PM Medical Record Number: [id number] Patient Account Number: [id number] Date of Birth/Sex: Treating RN: Sep 14, 1966 (56 y.o. [name]) [name] Primary Care Provider: Aram [name] Other Clinician: Referring Provider: Treating Provider/Extender: [name] in Treatment: 14 Information Obtained from: Patient Chief Complaint Right ischial tuberosity pressure ulcer with chronic osteomyelitis Electronic Signature(s) Signed: 07/12/2023 2:47:34 PM By: Chase Derry PA-C Entered By: [name] on 07/12/2023 11:47:34 -------------------------------------------------------------------------------- HPI Details Patient Name: Date of Service: [name] [facility], [name] 07/12/2023 2:30 PM Medical Record Number: [id number] Patient Account Number: [id number] Date of Birth/Sex: Treating RN: [birth year] (56 y.o. [name] Primary Care Provider: Aram [name] Other Clinician: Referring Provider: Treating Provider/Extender: [name] in Treatment: 14 History of Present Illness HPI Description: 57 year old male with a history of spina bifida presenting to [place] with a history of a open wound on the left gluteal region near his upper thigh which she's had for several weeks. He was seen in the ED recently at [facility] and was put on doxycycline and was advised some local care. his past medical history is significant for spinal bifid, neurogenic bladder, kidney stones, sacral pressure sores, constipation. Past surgical history significant for partial cystectomy, ileal conduit, VP shunt removal, percutaneous nephro lithotripsy. In the remote past the patient says he's had some treatment for a ulcer on this area and was treated with a skin  graft. Readmission: 10/16/2021 upon evaluation today patient appears to be doing somewhat poorly in regard to a fairly large wound noted over the right ischial tuberosity location. This is a stage IV pressure ulcer. Of note this is also positive for osteomyelitis upon a CT scan which was performed during the time that he was admitted in the hospital on 09/19/2021. With that being said it is noted in the right inferior buttock that he has a decubitus ulcer which extends to the posterior toe inferior right ischium where there is evidence of osteomyelitis. When he was here in the office actually missed that this was positive I missed read it and thought it said that there was no osteomyelitis. That is not the case there is in fact osteomyelitis noted here. I actually did review his notes as well in epic. Looking to the discharge summary it appears that the patient was admitted from 09/19/2021 through 09/23/2021. He was discharged home with home health care according to the note. With that being said from what I understand his sister actually takes care of him at this point. He was also to follow-up with a primary care provider and here at the wound care center within a week. I am just now seeing him on the 13th almost a [name], [name] ([id number]) [id number] Page 2 of 11 month after discharge. He does have a history of again osteomyelitis of this right hip/ischial tuberosity location. 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 The reason for his admission was actually worsening of the decubitus ulcer upon admission. He does have chronic osteomyelitis of this area. He was treated with empiric antibiotics he had acute kidney injury which improved with IV fluids he also had hypokalemia. Surgical debridement was performed by general

## 2023-07-12 NOTE — Progress Notes (Addendum)
N/A A (cm) : rea 155.509 0 N/A Volume (cm) : -380.00% 100.00% N/A % Reduction in A rea: -956.00% 100.00% N/A % Reduction in Volume: Category/Stage IV Category/Stage IV N/A Classification: Large Medium N/A Exudate A mount: Serosanguineous Serosanguineous N/A Exudate Type: red, brown red, brown N/A Exudate Color: Medium (34-66%) Medium (34-66%) N/A Granulation A mount: Red, Pink Red N/A Granulation Quality: Medium (34-66%) Medium (34-66%) N/A Necrotic A mount: Fat Layer (Subcutaneous Tissue): Yes Fat Layer (Subcutaneous Tissue): Yes N/A Exposed Structures: Muscle: Yes Fascia: No Bone: Yes Tendon: No [name] ([id number]) [id number] Page 5 of 10 Fascia: No Muscle: No Tendon: No Joint: No Joint: No Bone: No None None N/A Epithelialization: Treatment Notes Electronic Signature(s) Signed: 07/12/2023 2:57:32 PM By: [name] RN Entered By: [name] on 07/12/2023 11:57:32 -------------------------------------------------------------------------------- Multi-Disciplinary Care Plan Details Patient Name: Date of Service: [name] TH, [name] 07/12/2023 2:30 PM Medical Record Number: [id number] Patient Account Number: [id number] Date of Birth/Sex: Treating RN: Jan 20, 1966 (56  y.o. [name] Primary Care Braven Wolk: Aram Beecham Other Clinician: Referring Jennalynn Rivard: Treating Kearra Calkin/Extender: Gabriel Earing in Treatment: 14 Active Inactive Pressure Nursing Diagnoses: Potential for impaired tissue integrity related to pressure, friction, moisture, and shear Goals: Patient will remain free from development of additional pressure ulcers Date Initiated: 03/31/2023 Target Resolution Date: 07/01/2023 Goal Status: Active Interventions: Assess: immobility, friction, shearing, incontinence upon admission and as needed Assess offloading mechanisms upon admission and as needed Assess potential for pressure ulcer upon admission and as needed Notes: Wound/Skin Impairment Nursing Diagnoses: Knowledge deficit related to ulceration/compromised skin integrity Goals: Patient/caregiver will verbalize understanding of skin care regimen Date Initiated: 03/31/2023 Target Resolution Date: 07/01/2023 Goal Status: Active Ulcer/skin breakdown will have a volume reduction of 30% by week 4 Date Initiated: 03/31/2023 Date Inactivated: 06/02/2023 Target Resolution Date: 05/31/2023 Goal Status: Unmet Unmet Reason: comorbidities Ulcer/skin breakdown will have a volume reduction of 50% by week 8 Date Initiated: 03/31/2023 Target Resolution Date: 07/01/2023 Goal Status: Active Ulcer/skin breakdown will have a volume reduction of 80% by week 12 Date Initiated: 03/31/2023 Target Resolution Date: 07/31/2023 Goal Status: Active Ulcer/skin breakdown will heal within 14 weeks Date Initiated: 03/31/2023 Target Resolution Date: 08/31/2023 [name], [name] ([id number]) [phone].pdf Page 6 of 10 Goal Status: Active Interventions: Assess patient/caregiver ability to obtain necessary supplies Assess patient/caregiver ability to perform ulcer/skin care regimen upon admission and as needed Assess ulceration(s) every visit Notes: Electronic  Signature(s) Signed: 07/12/2023 2:57:38 PM By: [name] RN Entered By: [name] on 07/12/2023 11:57:38 -------------------------------------------------------------------------------- Pain Assessment Details Patient Name: Date of ServiceEulas [name] [facility], [name] 07/12/2023 2:30 PM Medical Record Number: [id number] Patient Account Number: [id number] Date of Birth/Sex: Treating RN: 1966/09/14 (56 y.o. [name] Primary Care [name]: Aram Beecham Other Clinician: Referring Panfilo Ketchum: Treating Christifer Chapdelaine/Extender: Hermine Messick Weeks in Treatment: 14 Active Problems Location of Pain Severity and Description of Pain Patient Has Paino No Site Locations Pain Management and Medication Current Pain Management: Electronic Signature(s) Signed: 07/12/2023 2:52:48 PM By: [name] RN Entered By: [name] on 07/12/2023 11:52:48 [name] ([id number]) [id number] Page 7 of 10 -------------------------------------------------------------------------------- Patient/Caregiver Education Details Patient Name: Date of Service: [name] [facility], DO [place] LD 10/8/2024andnbsp2:30 PM Medical Record Number: [id number] Patient Account Number: [id number] Date of Birth/Gender: Treating RN: May 02, 1966 (56 y.o. [name]) [name] Primary Care Physician: Aram Beecham Other Clinician: Referring Physician: Treating Physician/Extender: Gabriel Earing in Treatment: 14 Education Assessment Education Provided To: Patient Education Topics  Wound Cleansing - one wound 1 5 []  - 0 Complex Wound Cleansing - multiple wounds X- 1 5 Wound Imaging (photographs - any number of wounds) []  - 0 Wound Tracing (instead of photographs) X- 1 5 Simple Wound Measurement - one wound []  - 0 Complex Wound Measurement - multiple wounds INTERVENTIONS - Wound Dressings X - Small Wound Dressing one or multiple wounds 1 10 []  - 0 Medium Wound Dressing one or multiple wounds []  - 0 Large Wound Dressing one or multiple wounds []  - 0 Application of Medications - topical []  - 0 Application of Medications - injection INTERVENTIONS - Miscellaneous []  - 0 External ear exam [name], [name] ([id number]) [id number] Page 3 of 10 []  - 0 Specimen Collection (cultures, biopsies, blood, body fluids, etc.) []  - 0 Specimen(s) / Culture(s) sent or taken to Lab for analysis []  - 0 Patient Transfer (multiple staff / Michiel Sites Lift / Similar devices) []  - 0 Simple Staple / Suture removal (25 or less) []  - 0 Complex Staple / Suture removal (26 or more) []  - 0 Hypo / Hyperglycemic Management (close monitor of Blood Glucose) []  - 0 Ankle / Brachial Index (ABI) - do not check if billed separately X- 1 5 Vital Signs Has the patient been seen at the hospital within the last three years: Yes Total Score: 85 Level Of Care: New/Established - Level 3 Electronic Signature(s) Signed: 08/01/2023 8:01:26 AM By: [name] RN Entered By: [name] on 07/12/2023 12:16:05 -------------------------------------------------------------------------------- Encounter Discharge Information Details Patient Name: Date of Service: [name] TH, [name] 07/12/2023 2:30 PM Medical Record Number: [id number] Patient Account  Number: [id number] Date of Birth/Sex: Treating RN: December 07, 1965 (56 y.o. [name] Primary Care Claudy Abdallah: Aram Beecham Other Clinician: Referring Shakeria Robinette: Treating Nohemi Nicklaus/Extender: Gabriel Earing in Treatment: 14 Encounter Discharge Information Items Discharge Condition: Stable Ambulatory Status: Wheelchair Discharge Destination: Home Transportation: Private Auto Accompanied By: self Schedule Follow-up Appointment: Yes Clinical Summary of Care: Electronic Signature(s) Signed: 07/12/2023 3:16:54 PM By: [name] RN Entered By: [name] on 07/12/2023 12:16:53 -------------------------------------------------------------------------------- Lower Extremity Assessment Details Patient Name: Date of Service: [name] [name], [name] 07/12/2023 2:30 PM [place], [name] ([id number]) [id number] Page 4 of 10 Medical Record Number: [id number] Patient Account Number: [id number] Date of Birth/Sex: Treating RN: 07-10-1966 (56 y.o. [name]) [name] Primary Care Lalena Salas: Aram Beecham Other Clinician: Referring Laraya Pestka: Treating [place]/Extender: Hermine Messick Weeks in Treatment: 14 Electronic Signature(s) Signed: 07/12/2023 2:56:13 PM By: [name] RN Entered By: [name] on 07/12/2023 11:56:12 -------------------------------------------------------------------------------- Multi Wound Chart Details Patient Name: Date of Service: [name] TH, [name] 07/12/2023 2:30 PM Medical Record Number: [id number] Patient Account Number: [id number] Date of Birth/Sex: Treating RN: 1965-12-19 (56 y.o. [name]) [name] Primary Care Cozetta Seif: Aram Beecham Other Clinician: Referring Amberlyn Martinezgarcia: Treating Ilaisaane Marts/Extender: Hermine Messick Weeks in Treatment: 14 Vital Signs Height(in): 59 Pulse(bpm): 116 Weight(lbs): 90 Blood Pressure(mmHg): 110/69 Body Mass Index(BMI): 18.2 Temperature(F): 97.5 Respiratory  Rate(breaths/min): 20 [5:Photos:] [N/A:N/A] Right, Midline Gluteus Right, Lateral Gluteus N/A Wound Location: Gradually Appeared Gradually Appeared N/A Wounding Event: Pressure Ulcer Pressure Ulcer N/A Primary Etiology: Cataracts, Glaucoma, Coronary Artery Cataracts, Glaucoma, Coronary Artery N/A Comorbid History: Disease, Hypertension, History of Disease, Hypertension, History of pressure wounds, Osteoarthritis, pressure wounds, Osteoarthritis, Paraplegia Paraplegia 10/04/2021 10/04/2021 N/A Date Acquired: 14 14 N/A Weeks of Treatment: Open Converted N/A Wound Status: No No N/A Wound Recurrence: 6x10x3.3 0x0x0 N/A Measurements L x W x D (cm) 47.124 0  Provided Pressure: Handouts: Pressure Injury: Prevention and Offloading Methods: Explain/Verbal Responses: State content correctly Electronic Signature(s) Signed: 08/01/2023 8:01:26 AM By: [name] RN Entered By: [name] on 07/12/2023 11:58:28 -------------------------------------------------------------------------------- Wound Assessment Details Patient Name: Date of Service: [name]  [facility], [name] 07/12/2023 2:30 PM Medical Record Number: [id number] Patient Account Number: [id number] Date of Birth/Sex: Treating RN: Aug 26, 1966 (56 y.o. [name] Primary Care Twan Harkin: Aram Beecham Other Clinician: Referring Zhi Geier: Treating Quadarius Henton/Extender: Hermine Messick Weeks in Treatment: 14 Wound Status Wound Number: 5 Primary Pressure Ulcer Etiology: Wound Location: Right, Midline Gluteus Wound Open Wounding Event: Gradually Appeared Status: Date Acquired: 10/04/2021 Comorbid Cataracts, Glaucoma, Coronary Artery Disease, Hypertension, Weeks Of Treatment: 14 History: History of pressure wounds, Osteoarthritis, Paraplegia Clustered Wound: No Photos [place], [name] ([id number]) [id number] Page 8 of 10 Wound Measurements Length: (cm) 6 Width: (cm) 10 Depth: (cm) 3.3 Area: (cm) 47.124 Volume: (cm) 155.509 % Reduction in Area: -380% % Reduction in Volume: -956% Epithelialization: None Tunneling: No Undermining: No Wound Description Classification: Category/Stage IV Exudate Amount: Large Exudate Type: Serosanguineous Exudate Color: red, brown Foul Odor After Cleansing: No Slough/Fibrino Yes Wound Bed Granulation Amount: Medium (34-66%) Exposed Structure Granulation Quality: Red, Pink Fascia Exposed: No Necrotic Amount: Medium (34-66%) Fat Layer (Subcutaneous Tissue) Exposed: Yes Tendon Exposed: No Muscle Exposed: Yes Necrosis of Muscle: No Joint Exposed: No Bone Exposed: Yes Electronic Signature(s) Signed: 07/12/2023 2:57:04 PM By: [name] RN Previous Signature: 07/12/2023 2:55:08 PM Version By: [name] RN Entered By: [name] on 07/12/2023 11:57:04 -------------------------------------------------------------------------------- Wound Assessment Details Patient Name: Date of Service: [name] TH, [name] 07/12/2023 2:30 PM Medical Record Number: [id number] Patient Account Number: [id number] Date  of Birth/Sex: Treating RN: 10-26-1965 (56 y.o. [name] Primary Care Adolfo Granieri: Aram Beecham Other Clinician: Referring Myka Lukins: Treating Hester Forget/Extender: Hermine Messick Weeks in Treatment: 14 Wound Status Wound Number: 6 Primary Pressure Ulcer Etiology: Wound Location: Right, Lateral Gluteus Wound Converted Wounding Event: Gradually Appeared Status: Date Acquired: 10/04/2021 Comorbid Cataracts, Glaucoma, Coronary Artery Disease, Hypertension, Weeks Of Treatment: 14 History: History of pressure wounds, Osteoarthritis, Paraplegia Clustered Wound: No Photos [place], [name] ([id number]) [id number] Page 9 of 10 Wound Measurements Length: (cm) Width: (cm) Depth: (cm) Area: (cm) Volume: (cm) 0 % Reduction in Area: 100% 0 % Reduction in Volume: 100% 0 Epithelialization: None 0 Tunneling: No 0 Undermining: No Wound Description Classification: Category/Stage IV Exudate Amount: Medium Exudate Type: Serosanguineous Exudate Color: red, brown Foul Odor After Cleansing: No Slough/Fibrino Yes Wound Bed Granulation Amount: Medium (34-66%) Exposed Structure Granulation Quality: Red Fascia Exposed: No Necrotic Amount: Medium (34-66%) Fat Layer (Subcutaneous Tissue) Exposed: Yes Necrotic Quality: Adherent Slough Tendon Exposed: No Muscle Exposed: No Joint Exposed: No Bone Exposed: No Treatment Notes Wound #6 (Gluteus) Wound Laterality: Right, Lateral Cleanser Peri-Wound Care Topical Primary Dressing Secondary Dressing Secured With Compression Wrap Compression Stockings Add-Ons Electronic Signature(s) Signed: 07/12/2023 2:56:03 PM By: [name] RN Entered By: [name] on 07/12/2023 11:56:02 Vitals Details -------------------------------------------------------------------------------- [name] ([id number]) [id number] Page 10 of 10 Patient Name: Date of ServiceEulas [name] [facility], DO  NA LD 07/12/2023 2:30 PM Medical Record Number: [id number] Patient Account Number: [id number] Date of Birth/Sex: Treating RN: 11-21-1965 (56 y.o. [name]) [name] Primary Care Rylie [place]: Aram Beecham Other Clinician: Referring Emylie Amster: Treating Kehinde Totzke/Extender: Hermine Messick Weeks in Treatment: 14 Vital Signs Time Taken: 14:51 Temperature (F): 97.5 Height (in): 59 Pulse (bpm): 116 Weight (lbs): 90 Respiratory Rate (breaths/min): 20 Body Mass Index (  N/A A (cm) : rea 155.509 0 N/A Volume (cm) : -380.00% 100.00% N/A % Reduction in A rea: -956.00% 100.00% N/A % Reduction in Volume: Category/Stage IV Category/Stage IV N/A Classification: Large Medium N/A Exudate A mount: Serosanguineous Serosanguineous N/A Exudate Type: red, brown red, brown N/A Exudate Color: Medium (34-66%) Medium (34-66%) N/A Granulation A mount: Red, Pink Red N/A Granulation Quality: Medium (34-66%) Medium (34-66%) N/A Necrotic A mount: Fat Layer (Subcutaneous Tissue): Yes Fat Layer (Subcutaneous Tissue): Yes N/A Exposed Structures: Muscle: Yes Fascia: No Bone: Yes Tendon: No [name] ([id number]) [id number] Page 5 of 10 Fascia: No Muscle: No Tendon: No Joint: No Joint: No Bone: No None None N/A Epithelialization: Treatment Notes Electronic Signature(s) Signed: 07/12/2023 2:57:32 PM By: [name] RN Entered By: [name] on 07/12/2023 11:57:32 -------------------------------------------------------------------------------- Multi-Disciplinary Care Plan Details Patient Name: Date of Service: [name] TH, [name] 07/12/2023 2:30 PM Medical Record Number: [id number] Patient Account Number: [id number] Date of Birth/Sex: Treating RN: Jan 20, 1966 (56  y.o. [name] Primary Care Braven Wolk: Aram Beecham Other Clinician: Referring Jennalynn Rivard: Treating Kearra Calkin/Extender: Gabriel Earing in Treatment: 14 Active Inactive Pressure Nursing Diagnoses: Potential for impaired tissue integrity related to pressure, friction, moisture, and shear Goals: Patient will remain free from development of additional pressure ulcers Date Initiated: 03/31/2023 Target Resolution Date: 07/01/2023 Goal Status: Active Interventions: Assess: immobility, friction, shearing, incontinence upon admission and as needed Assess offloading mechanisms upon admission and as needed Assess potential for pressure ulcer upon admission and as needed Notes: Wound/Skin Impairment Nursing Diagnoses: Knowledge deficit related to ulceration/compromised skin integrity Goals: Patient/caregiver will verbalize understanding of skin care regimen Date Initiated: 03/31/2023 Target Resolution Date: 07/01/2023 Goal Status: Active Ulcer/skin breakdown will have a volume reduction of 30% by week 4 Date Initiated: 03/31/2023 Date Inactivated: 06/02/2023 Target Resolution Date: 05/31/2023 Goal Status: Unmet Unmet Reason: comorbidities Ulcer/skin breakdown will have a volume reduction of 50% by week 8 Date Initiated: 03/31/2023 Target Resolution Date: 07/01/2023 Goal Status: Active Ulcer/skin breakdown will have a volume reduction of 80% by week 12 Date Initiated: 03/31/2023 Target Resolution Date: 07/31/2023 Goal Status: Active Ulcer/skin breakdown will heal within 14 weeks Date Initiated: 03/31/2023 Target Resolution Date: 08/31/2023 [name], [name] ([id number]) [phone].pdf Page 6 of 10 Goal Status: Active Interventions: Assess patient/caregiver ability to obtain necessary supplies Assess patient/caregiver ability to perform ulcer/skin care regimen upon admission and as needed Assess ulceration(s) every visit Notes: Electronic  Signature(s) Signed: 07/12/2023 2:57:38 PM By: [name] RN Entered By: [name] on 07/12/2023 11:57:38 -------------------------------------------------------------------------------- Pain Assessment Details Patient Name: Date of ServiceEulas [name] [facility], [name] 07/12/2023 2:30 PM Medical Record Number: [id number] Patient Account Number: [id number] Date of Birth/Sex: Treating RN: 1966/09/14 (56 y.o. [name] Primary Care [name]: Aram Beecham Other Clinician: Referring Panfilo Ketchum: Treating Christifer Chapdelaine/Extender: Hermine Messick Weeks in Treatment: 14 Active Problems Location of Pain Severity and Description of Pain Patient Has Paino No Site Locations Pain Management and Medication Current Pain Management: Electronic Signature(s) Signed: 07/12/2023 2:52:48 PM By: [name] RN Entered By: [name] on 07/12/2023 11:52:48 [name] ([id number]) [id number] Page 7 of 10 -------------------------------------------------------------------------------- Patient/Caregiver Education Details Patient Name: Date of Service: [name] [facility], DO [place] LD 10/8/2024andnbsp2:30 PM Medical Record Number: [id number] Patient Account Number: [id number] Date of Birth/Gender: Treating RN: May 02, 1966 (56 y.o. [name]) [name] Primary Care Physician: Aram Beecham Other Clinician: Referring Physician: Treating Physician/Extender: Gabriel Earing in Treatment: 14 Education Assessment Education Provided To: Patient Education Topics  Wound Cleansing - one wound 1 5 []  - 0 Complex Wound Cleansing - multiple wounds X- 1 5 Wound Imaging (photographs - any number of wounds) []  - 0 Wound Tracing (instead of photographs) X- 1 5 Simple Wound Measurement - one wound []  - 0 Complex Wound Measurement - multiple wounds INTERVENTIONS - Wound Dressings X - Small Wound Dressing one or multiple wounds 1 10 []  - 0 Medium Wound Dressing one or multiple wounds []  - 0 Large Wound Dressing one or multiple wounds []  - 0 Application of Medications - topical []  - 0 Application of Medications - injection INTERVENTIONS - Miscellaneous []  - 0 External ear exam [name], [name] ([id number]) [id number] Page 3 of 10 []  - 0 Specimen Collection (cultures, biopsies, blood, body fluids, etc.) []  - 0 Specimen(s) / Culture(s) sent or taken to Lab for analysis []  - 0 Patient Transfer (multiple staff / Michiel Sites Lift / Similar devices) []  - 0 Simple Staple / Suture removal (25 or less) []  - 0 Complex Staple / Suture removal (26 or more) []  - 0 Hypo / Hyperglycemic Management (close monitor of Blood Glucose) []  - 0 Ankle / Brachial Index (ABI) - do not check if billed separately X- 1 5 Vital Signs Has the patient been seen at the hospital within the last three years: Yes Total Score: 85 Level Of Care: New/Established - Level 3 Electronic Signature(s) Signed: 08/01/2023 8:01:26 AM By: [name] RN Entered By: [name] on 07/12/2023 12:16:05 -------------------------------------------------------------------------------- Encounter Discharge Information Details Patient Name: Date of Service: [name] TH, [name] 07/12/2023 2:30 PM Medical Record Number: [id number] Patient Account  Number: [id number] Date of Birth/Sex: Treating RN: December 07, 1965 (56 y.o. [name] Primary Care Claudy Abdallah: Aram Beecham Other Clinician: Referring Shakeria Robinette: Treating Nohemi Nicklaus/Extender: Gabriel Earing in Treatment: 14 Encounter Discharge Information Items Discharge Condition: Stable Ambulatory Status: Wheelchair Discharge Destination: Home Transportation: Private Auto Accompanied By: self Schedule Follow-up Appointment: Yes Clinical Summary of Care: Electronic Signature(s) Signed: 07/12/2023 3:16:54 PM By: [name] RN Entered By: [name] on 07/12/2023 12:16:53 -------------------------------------------------------------------------------- Lower Extremity Assessment Details Patient Name: Date of Service: [name] [name], [name] 07/12/2023 2:30 PM [place], [name] ([id number]) [id number] Page 4 of 10 Medical Record Number: [id number] Patient Account Number: [id number] Date of Birth/Sex: Treating RN: 07-10-1966 (56 y.o. [name]) [name] Primary Care Lalena Salas: Aram Beecham Other Clinician: Referring Laraya Pestka: Treating [place]/Extender: Hermine Messick Weeks in Treatment: 14 Electronic Signature(s) Signed: 07/12/2023 2:56:13 PM By: [name] RN Entered By: [name] on 07/12/2023 11:56:12 -------------------------------------------------------------------------------- Multi Wound Chart Details Patient Name: Date of Service: [name] TH, [name] 07/12/2023 2:30 PM Medical Record Number: [id number] Patient Account Number: [id number] Date of Birth/Sex: Treating RN: 1965-12-19 (56 y.o. [name]) [name] Primary Care Cozetta Seif: Aram Beecham Other Clinician: Referring Amberlyn Martinezgarcia: Treating Ilaisaane Marts/Extender: Hermine Messick Weeks in Treatment: 14 Vital Signs Height(in): 59 Pulse(bpm): 116 Weight(lbs): 90 Blood Pressure(mmHg): 110/69 Body Mass Index(BMI): 18.2 Temperature(F): 97.5 Respiratory  Rate(breaths/min): 20 [5:Photos:] [N/A:N/A] Right, Midline Gluteus Right, Lateral Gluteus N/A Wound Location: Gradually Appeared Gradually Appeared N/A Wounding Event: Pressure Ulcer Pressure Ulcer N/A Primary Etiology: Cataracts, Glaucoma, Coronary Artery Cataracts, Glaucoma, Coronary Artery N/A Comorbid History: Disease, Hypertension, History of Disease, Hypertension, History of pressure wounds, Osteoarthritis, pressure wounds, Osteoarthritis, Paraplegia Paraplegia 10/04/2021 10/04/2021 N/A Date Acquired: 14 14 N/A Weeks of Treatment: Open Converted N/A Wound Status: No No N/A Wound Recurrence: 6x10x3.3 0x0x0 N/A Measurements L x W x D (cm) 47.124 0

## 2023-07-19 ENCOUNTER — Ambulatory Visit: Payer: 59 | Admitting: Physician Assistant

## 2023-07-28 ENCOUNTER — Encounter: Payer: 59 | Admitting: Physician Assistant

## 2023-07-28 DIAGNOSIS — M8668 Other chronic osteomyelitis, other site: Secondary | ICD-10-CM | POA: Diagnosis not present

## 2023-07-29 NOTE — Progress Notes (Signed)
unfortunately. This again was a condition about seeing him back that he had to make sure that he showed up for all visits. That is not happening at the moment and I have discussion with him today about that. I discussed that if he is [place], [place] ([id number]) [id number] Page 4 of 12 not going to show up for his visits that we are going to have to discharge him that was the original agreement and that is still where things stand at this point to be honest. The patient voiced understanding. I told him that we are going to give him 1 more shot today before we would proceed to anything like that but again if he is not coming to the appointments as directed that there is really no point and has continued to have them on the books as far as trying to get this healed is concerned. He voiced understanding. He still tells me that his dressings were also not staying in place in fact he tells me that every time he gets out of the bed the dressing is already off. He came in today with no dressing on. Noncompliance has been a significant issue through the course of the treatment with this gentleman and still continues to be so. Electronic Signature(s) Signed: 07/28/2023 6:19:39 PM By: Allen Derry PA-C Entered By: Allen Derry on 07/28/2023 18:19:39 -------------------------------------------------------------------------------- Physical Exam Details Patient Name: [name] 07/28/2023 1:45 PM Medical Record Number: [id number] Patient Account Number:  [id number] Date of Birth/Sex: Treating RN: 04/14/1966 (57 y.o. [name] Primary Care Provider: Aram Beecham Other Clinician: Betha Loa Referring Provider: Treating Provider/Extender: Hermine Messick Weeks in Treatment: 17 Constitutional Well-nourished and well-hydrated in no acute distress. Respiratory normal breathing without difficulty. Psychiatric this patient is able to make decisions and demonstrates good insight into disease process. Alert and Oriented x 3. pleasant and cooperative. Notes Upon inspection patient's wound bed actually showed signs of poor granulation and epithelization at this point. I do not see obvious evidence of infection though we know he has been infected in the past. The new wounds appear to be new pressure injuries unfortunately. Electronic Signature(s) Signed: 07/28/2023 6:19:59 PM By: Allen Derry PA-C Entered By: Allen Derry on 07/28/2023 18:19:58 -------------------------------------------------------------------------------- Physician Orders Details Patient Name: Date of Service: [name], [name] 07/28/2023 1:45 PM Medical Record Number: [id number] Patient Account Number: [id number] Date of Birth/Sex: Treating RN: 1965-11-12 (57 y.o. [name] Primary Care Provider: Aram Beecham Other Clinician: Betha Loa Referring Provider: Treating Provider/Extender: Gabriel Earing in Treatment: 17 The following information was scribed by: Betha Loa The information was scribed for: [place], [place], [name] ([id number]) [id number] Page 5 of 12 Verbal / Phone Orders: Yes Clinician: Angelina Pih Read Back and Verified: Yes Diagnosis Coding Follow-up Appointments Return Appointment in 1 week. Bathing/ [employer] wounds with antibacterial soap and water. Anesthetic (Use 'Patient Medications' Section for Anesthetic Order Entry) Lidocaine  applied to wound bed Off-Loading Gel wheelchair cushion Hospital bed/mattress - ETHOS Low air-loss mattress (Group 2) - HOSPITAL BED WITH GROUPE 2 AIR MATTRESS AND TRAPEZE BAR ETHOS Turn and reposition every 2 hours Wound Treatment Wound #5 - Gluteus Wound Laterality: Right, Midline Cleanser: Byram Ancillary Kit - 15 Day Supply (Generic) 3 x Per Week/30 Days Discharge Instructions: Use supplies as instructed; Kit contains: (15) Saline Bullets; (15) 3x3 Gauze; 15 pr Gloves Prim Dressing: Gauze 3 x Per Week/30 Days ary Discharge Instructions:  Silvercel Small 2x2 (in/in) 3 x Per Week/30 Days ary Discharge Instructions: Apply Silvercel Small 2x2 (in/in) as instructed Secured With: Tegaderm Film Transparent 4x4.75 (in/in) 3 x Per Week/30 Days Discharge Instructions: Apply to wound bed 1. Based on what I am seeing I do believe that the patient is going to require intervention here and he needs to have aggressive offloading we have been doing everything we can to get him in a new bed that he does have an air mattress we are looking for a group 3 pressure relieving surface. 2. I am going to recommend as well that the patient should continue to utilize the silver alginate dressing for the time being I think that we can use Tegaderm this is over the new area on the left. 3. With regard to the primary wound that we  have been dealing with I think still the Dakin's moistened gauze dressing is probably the best way to go and then recovering this with either an ABD pad or Zetuvit. I think the ABD pad is probably better due to the size. We will see patient back for reevaluation in 1 week here in the clinic. If anything worsens or changes patient will contact our office for additional recommendations. Electronic Signature(s) Signed: 07/28/2023 6:20:48 PM By: Allen Derry PA-C Entered By: Allen Derry on 07/28/2023 18:20:48 -------------------------------------------------------------------------------- SuperBill Details Patient Name: Date of Service: [name], [name] 07/28/2023 Medical Record Number: [id number] Patient Account Number: [id number] Date of Birth/Sex: Treating RN: 05-10-1966 (57 y.o. [name] Primary Care Provider: Aram Beecham Other Clinician: Betha Loa Referring Provider: Treating Provider/Extender: Gabriel Earing in Treatment: 17 Diagnosis Coding ICD-10 Codes Code Description [phone] Other chronic osteomyelitis, other site L89.314 Pressure ulcer of right buttock, stage 4 Q76.0 Spina bifida occulta Z93.3 Colostomy status I10 Essential (primary) hypertension I25.10 Atherosclerotic heart disease of native coronary artery without angina pectoris Facility Procedures : CPT4 Code: 30865784 Description: 99213 - WOUND CARE VISIT-LEV 3 EST PT Modifier: Quantity: 1 Physician Procedures : CPT4 Code Description Modifier [id number] 99213 - WC PHYS LEVEL 3 - EST PT ICD-10 Diagnosis Description M86.68 Other chronic osteomyelitis, other site L89.314 Pressure ulcer of right buttock, stage 4 Q76.0 Spina bifida occulta Z93.3 Colostomy status Quantity: 1 Electronic Signature(s) Signed: 07/28/2023 6:23:43 PM By: Antionette Fairy, Humberto [phone] By: Allen Derry PA-C [phone].pdf Page 12 of 12 Signed: 07/28/2023  6:23:43 Entered By: Allen Derry on 07/28/2023 18:23:42  Silvercel Small 2x2 (in/in) 3 x Per Week/30 Days ary Discharge Instructions: Apply Silvercel Small 2x2 (in/in) as instructed Secured With: Tegaderm Film Transparent 4x4.75 (in/in) 3 x Per Week/30 Days Discharge Instructions: Apply to wound bed 1. Based on what I am seeing I do believe that the patient is going to require intervention here and he needs to have aggressive offloading we have been doing everything we can to get him in a new bed that he does have an air mattress we are looking for a group 3 pressure relieving surface. 2. I am going to recommend as well that the patient should continue to utilize the silver alginate dressing for the time being I think that we can use Tegaderm this is over the new area on the left. 3. With regard to the primary wound that we  have been dealing with I think still the Dakin's moistened gauze dressing is probably the best way to go and then recovering this with either an ABD pad or Zetuvit. I think the ABD pad is probably better due to the size. We will see patient back for reevaluation in 1 week here in the clinic. If anything worsens or changes patient will contact our office for additional recommendations. Electronic Signature(s) Signed: 07/28/2023 6:20:48 PM By: Allen Derry PA-C Entered By: Allen Derry on 07/28/2023 18:20:48 -------------------------------------------------------------------------------- SuperBill Details Patient Name: Date of Service: [name], [name] 07/28/2023 Medical Record Number: [id number] Patient Account Number: [id number] Date of Birth/Sex: Treating RN: 05-10-1966 (57 y.o. [name] Primary Care Provider: Aram Beecham Other Clinician: Betha Loa Referring Provider: Treating Provider/Extender: Gabriel Earing in Treatment: 17 Diagnosis Coding ICD-10 Codes Code Description [phone] Other chronic osteomyelitis, other site L89.314 Pressure ulcer of right buttock, stage 4 Q76.0 Spina bifida occulta Z93.3 Colostomy status I10 Essential (primary) hypertension I25.10 Atherosclerotic heart disease of native coronary artery without angina pectoris Facility Procedures : CPT4 Code: 30865784 Description: 99213 - WOUND CARE VISIT-LEV 3 EST PT Modifier: Quantity: 1 Physician Procedures : CPT4 Code Description Modifier [id number] 99213 - WC PHYS LEVEL 3 - EST PT ICD-10 Diagnosis Description M86.68 Other chronic osteomyelitis, other site L89.314 Pressure ulcer of right buttock, stage 4 Q76.0 Spina bifida occulta Z93.3 Colostomy status Quantity: 1 Electronic Signature(s) Signed: 07/28/2023 6:23:43 PM By: Antionette Fairy, Humberto [phone] By: Allen Derry PA-C [phone].pdf Page 12 of 12 Signed: 07/28/2023  6:23:43 Entered By: Allen Derry on 07/28/2023 18:23:42  [place], [name] ([id number]) [id number] Page 1 of 12 Visit Report for 07/28/2023 Chief Complaint Document Details Patient Name: Date of Service: [name] [facility], DO [place] LD 07/28/2023 1:45 PM Medical Record Number: [id number] Patient Account Number: [id number] Date of Birth/Sex: Treating RN: 07/11/1966 (57 y.o. [name] Primary Care Provider: Aram Beecham Other Clinician: Betha Loa Referring Provider: Treating Provider/Extender: Gabriel Earing in Treatment: 17 Information Obtained from: Patient Chief Complaint Right ischial tuberosity pressure ulcer with chronic osteomyelitis and left gluteal ulcer Electronic Signature(s) Signed: 07/28/2023 6:19:10 PM By: Allen Derry PA-C Previous Signature: 07/28/2023 6:16:55 PM Version By: Allen Derry PA-C Entered By: Allen Derry on 07/28/2023 18:19:10 -------------------------------------------------------------------------------- HPI Details Patient Name: Date of Service: [name], [name] 07/28/2023 1:45 PM Medical Record Number: [id number] Patient Account Number: [id number] Date of Birth/Sex: Treating RN: [birth year] (57 y.o. [name] Primary Care Provider: Aram Beecham Other Clinician: Betha Loa Referring Provider: Treating Provider/Extender: Gabriel Earing in Treatment: 17 History of Present Illness HPI Description: 57 year old male with a history of spina bifida presenting to [place] with a history of a open wound on the left gluteal region near his upper thigh which she's had for several weeks. He was seen in the ED recently at [facility] and was put on doxycycline and was advised some local care. his past medical history is significant for spinal bifid, neurogenic bladder, kidney stones, sacral pressure sores, constipation. Past surgical history significant for partial cystectomy, ileal conduit, VP shunt removal, percutaneous  nephro lithotripsy. In the remote past the patient says he's had some treatment for a ulcer on this area and was treated with a skin graft. Readmission: 10/16/2021 upon evaluation today patient appears to be doing somewhat poorly in regard to a fairly large wound noted over the right ischial tuberosity location. This is a stage IV pressure ulcer. Of note this is also positive for osteomyelitis upon a CT scan which was performed during the time that he was admitted in the hospital on 09/19/2021. With that being said it is noted in the right inferior buttock that he has a decubitus ulcer which extends to the posterior toe inferior right ischium where there is evidence of osteomyelitis. When he was here in the office actually missed that this was positive I missed read it and thought it said that there was no osteomyelitis. That is not the case there is in fact osteomyelitis noted here. I actually did review his notes as well in epic. Looking to the discharge summary it appears that the patient was admitted from 09/19/2021 through 09/23/2021. He was discharged home with home health care according to the note. With that being said from what I understand his sister actually takes care of him at this [facility] [place] ([id number]) [phone].pdf Page 2 of 12 point. He was also to follow-up with a primary care provider and here at the wound care center within a week. I am just now seeing him on the 13th almost a month after discharge. He does have a history of again osteomyelitis of this right hip/ischial tuberosity location. 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 The reason for his admission was actually worsening of the decubitus ulcer upon admission. He does have chronic osteomyelitis of this area. He was treated with  empiric antibiotics he had  unfortunately. This again was a condition about seeing him back that he had to make sure that he showed up for all visits. That is not happening at the moment and I have discussion with him today about that. I discussed that if he is [place], [place] ([id number]) [id number] Page 4 of 12 not going to show up for his visits that we are going to have to discharge him that was the original agreement and that is still where things stand at this point to be honest. The patient voiced understanding. I told him that we are going to give him 1 more shot today before we would proceed to anything like that but again if he is not coming to the appointments as directed that there is really no point and has continued to have them on the books as far as trying to get this healed is concerned. He voiced understanding. He still tells me that his dressings were also not staying in place in fact he tells me that every time he gets out of the bed the dressing is already off. He came in today with no dressing on. Noncompliance has been a significant issue through the course of the treatment with this gentleman and still continues to be so. Electronic Signature(s) Signed: 07/28/2023 6:19:39 PM By: Allen Derry PA-C Entered By: Allen Derry on 07/28/2023 18:19:39 -------------------------------------------------------------------------------- Physical Exam Details Patient Name: [facility], [name] 07/28/2023 1:45 PM Medical Record Number: [id number] Patient Account Number:  [id number] Date of Birth/Sex: Treating RN: 04/14/1966 (57 y.o. [name] Primary Care Provider: Aram Beecham Other Clinician: Betha Loa Referring Provider: Treating Provider/Extender: Hermine Messick Weeks in Treatment: 17 Constitutional Well-nourished and well-hydrated in no acute distress. Respiratory normal breathing without difficulty. Psychiatric this patient is able to make decisions and demonstrates good insight into disease process. Alert and Oriented x 3. pleasant and cooperative. Notes Upon inspection patient's wound bed actually showed signs of poor granulation and epithelization at this point. I do not see obvious evidence of infection though we know he has been infected in the past. The new wounds appear to be new pressure injuries unfortunately. Electronic Signature(s) Signed: 07/28/2023 6:19:59 PM By: Allen Derry PA-C Entered By: Allen Derry on 07/28/2023 18:19:58 -------------------------------------------------------------------------------- Physician Orders Details Patient Name: Date of Service: [name], [name] 07/28/2023 1:45 PM Medical Record Number: [id number] Patient Account Number: [id number] Date of Birth/Sex: Treating RN: 1965-11-12 (57 y.o. [name] Primary Care Provider: Aram Beecham Other Clinician: Betha Loa Referring Provider: Treating Provider/Extender: Gabriel Earing in Treatment: 17 The following information was scribed by: Betha Loa The information was scribed for: [place], [place], [name] ([id number]) [id number] Page 5 of 12 Verbal / Phone Orders: Yes Clinician: Angelina Pih Read Back and Verified: Yes Diagnosis Coding Follow-up Appointments Return Appointment in 1 week. Bathing/ [employer] wounds with antibacterial soap and water. Anesthetic (Use 'Patient Medications' Section for Anesthetic Order Entry) Lidocaine  applied to wound bed Off-Loading Gel wheelchair cushion Hospital bed/mattress - ETHOS Low air-loss mattress (Group 2) - HOSPITAL BED WITH GROUPE 2 AIR MATTRESS AND TRAPEZE BAR ETHOS Turn and reposition every 2 hours Wound Treatment Wound #5 - Gluteus Wound Laterality: Right, Midline Cleanser: Byram Ancillary Kit - 15 Day Supply (Generic) 3 x Per Week/30 Days Discharge Instructions: Use supplies as instructed; Kit contains: (15) Saline Bullets; (15) 3x3 Gauze; 15 pr Gloves Prim Dressing: Gauze 3 x Per Week/30 Days ary Discharge Instructions:  unfortunately. This again was a condition about seeing him back that he had to make sure that he showed up for all visits. That is not happening at the moment and I have discussion with him today about that. I discussed that if he is [place], [place] ([id number]) [id number] Page 4 of 12 not going to show up for his visits that we are going to have to discharge him that was the original agreement and that is still where things stand at this point to be honest. The patient voiced understanding. I told him that we are going to give him 1 more shot today before we would proceed to anything like that but again if he is not coming to the appointments as directed that there is really no point and has continued to have them on the books as far as trying to get this healed is concerned. He voiced understanding. He still tells me that his dressings were also not staying in place in fact he tells me that every time he gets out of the bed the dressing is already off. He came in today with no dressing on. Noncompliance has been a significant issue through the course of the treatment with this gentleman and still continues to be so. Electronic Signature(s) Signed: 07/28/2023 6:19:39 PM By: Allen Derry PA-C Entered By: Allen Derry on 07/28/2023 18:19:39 -------------------------------------------------------------------------------- Physical Exam Details Patient Name: [facility], [name] 07/28/2023 1:45 PM Medical Record Number: [id number] Patient Account Number:  [id number] Date of Birth/Sex: Treating RN: 04/14/1966 (57 y.o. [name] Primary Care Provider: Aram Beecham Other Clinician: Betha Loa Referring Provider: Treating Provider/Extender: Hermine Messick Weeks in Treatment: 17 Constitutional Well-nourished and well-hydrated in no acute distress. Respiratory normal breathing without difficulty. Psychiatric this patient is able to make decisions and demonstrates good insight into disease process. Alert and Oriented x 3. pleasant and cooperative. Notes Upon inspection patient's wound bed actually showed signs of poor granulation and epithelization at this point. I do not see obvious evidence of infection though we know he has been infected in the past. The new wounds appear to be new pressure injuries unfortunately. Electronic Signature(s) Signed: 07/28/2023 6:19:59 PM By: Allen Derry PA-C Entered By: Allen Derry on 07/28/2023 18:19:58 -------------------------------------------------------------------------------- Physician Orders Details Patient Name: Date of Service: [name], [name] 07/28/2023 1:45 PM Medical Record Number: [id number] Patient Account Number: [id number] Date of Birth/Sex: Treating RN: 1965-11-12 (57 y.o. [name] Primary Care Provider: Aram Beecham Other Clinician: Betha Loa Referring Provider: Treating Provider/Extender: Gabriel Earing in Treatment: 17 The following information was scribed by: Betha Loa The information was scribed for: [place], [place], [name] ([id number]) [id number] Page 5 of 12 Verbal / Phone Orders: Yes Clinician: Angelina Pih Read Back and Verified: Yes Diagnosis Coding Follow-up Appointments Return Appointment in 1 week. Bathing/ [employer] wounds with antibacterial soap and water. Anesthetic (Use 'Patient Medications' Section for Anesthetic Order Entry) Lidocaine  applied to wound bed Off-Loading Gel wheelchair cushion Hospital bed/mattress - ETHOS Low air-loss mattress (Group 2) - HOSPITAL BED WITH GROUPE 2 AIR MATTRESS AND TRAPEZE BAR ETHOS Turn and reposition every 2 hours Wound Treatment Wound #5 - Gluteus Wound Laterality: Right, Midline Cleanser: Byram Ancillary Kit - 15 Day Supply (Generic) 3 x Per Week/30 Days Discharge Instructions: Use supplies as instructed; Kit contains: (15) Saline Bullets; (15) 3x3 Gauze; 15 pr Gloves Prim Dressing: Gauze 3 x Per Week/30 Days ary Discharge Instructions:  [place], [name] ([id number]) [id number] Page 1 of 12 Visit Report for 07/28/2023 Chief Complaint Document Details Patient Name: Date of Service: [name] [facility], DO [place] LD 07/28/2023 1:45 PM Medical Record Number: [id number] Patient Account Number: [id number] Date of Birth/Sex: Treating RN: 07/11/1966 (57 y.o. [name] Primary Care Provider: Aram Beecham Other Clinician: Betha Loa Referring Provider: Treating Provider/Extender: Gabriel Earing in Treatment: 17 Information Obtained from: Patient Chief Complaint Right ischial tuberosity pressure ulcer with chronic osteomyelitis and left gluteal ulcer Electronic Signature(s) Signed: 07/28/2023 6:19:10 PM By: Allen Derry PA-C Previous Signature: 07/28/2023 6:16:55 PM Version By: Allen Derry PA-C Entered By: Allen Derry on 07/28/2023 18:19:10 -------------------------------------------------------------------------------- HPI Details Patient Name: Date of Service: [name], [name] 07/28/2023 1:45 PM Medical Record Number: [id number] Patient Account Number: [id number] Date of Birth/Sex: Treating RN: [birth year] (57 y.o. [name] Primary Care Provider: Aram Beecham Other Clinician: Betha Loa Referring Provider: Treating Provider/Extender: Gabriel Earing in Treatment: 17 History of Present Illness HPI Description: 57 year old male with a history of spina bifida presenting to [place] with a history of a open wound on the left gluteal region near his upper thigh which she's had for several weeks. He was seen in the ED recently at [facility] and was put on doxycycline and was advised some local care. his past medical history is significant for spinal bifid, neurogenic bladder, kidney stones, sacral pressure sores, constipation. Past surgical history significant for partial cystectomy, ileal conduit, VP shunt removal, percutaneous  nephro lithotripsy. In the remote past the patient says he's had some treatment for a ulcer on this area and was treated with a skin graft. Readmission: 10/16/2021 upon evaluation today patient appears to be doing somewhat poorly in regard to a fairly large wound noted over the right ischial tuberosity location. This is a stage IV pressure ulcer. Of note this is also positive for osteomyelitis upon a CT scan which was performed during the time that he was admitted in the hospital on 09/19/2021. With that being said it is noted in the right inferior buttock that he has a decubitus ulcer which extends to the posterior toe inferior right ischium where there is evidence of osteomyelitis. When he was here in the office actually missed that this was positive I missed read it and thought it said that there was no osteomyelitis. That is not the case there is in fact osteomyelitis noted here. I actually did review his notes as well in epic. Looking to the discharge summary it appears that the patient was admitted from 09/19/2021 through 09/23/2021. He was discharged home with home health care according to the note. With that being said from what I understand his sister actually takes care of him at this [facility] ([id number]) [phone].pdf Page 2 of 12 point. He was also to follow-up with a primary care provider and here at the wound care center within a week. I am just now seeing him on the 13th almost a month after discharge. He does have a history of again osteomyelitis of this right hip/ischial tuberosity location. 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 The reason for his admission was actually worsening of the decubitus ulcer upon admission. He does have chronic osteomyelitis of this area. He was treated with  empiric antibiotics he had  [facility], [name] ([id number]) [id number] Page 1 of 12 Visit Report for 07/28/2023 Chief Complaint Document Details Patient Name: Date of Service: [name] [facility], DO [place] LD 07/28/2023 1:45 PM Medical Record Number: [id number] Patient Account Number: [id number] Date of Birth/Sex: Treating RN: 07/11/1966 (57 y.o. [name] Primary Care Provider: Aram Beecham Other Clinician: Betha Loa Referring Provider: Treating Provider/Extender: Gabriel Earing in Treatment: 17 Information Obtained from: Patient Chief Complaint Right ischial tuberosity pressure ulcer with chronic osteomyelitis and left gluteal ulcer Electronic Signature(s) Signed: 07/28/2023 6:19:10 PM By: Allen Derry PA-C Previous Signature: 07/28/2023 6:16:55 PM Version By: Allen Derry PA-C Entered By: Allen Derry on 07/28/2023 18:19:10 -------------------------------------------------------------------------------- HPI Details Patient Name: Date of Service: [name], [name] 07/28/2023 1:45 PM Medical Record Number: [id number] Patient Account Number: [id number] Date of Birth/Sex: Treating RN: [birth year] (57 y.o. [name] Primary Care Provider: Aram Beecham Other Clinician: Betha Loa Referring Provider: Treating Provider/Extender: Gabriel Earing in Treatment: 17 History of Present Illness HPI Description: 57 year old male with a history of spina bifida presenting to [place] with a history of a open wound on the left gluteal region near his upper thigh which she's had for several weeks. He was seen in the ED recently at [facility] and was put on doxycycline and was advised some local care. his past medical history is significant for spinal bifid, neurogenic bladder, kidney stones, sacral pressure sores, constipation. Past surgical history significant for partial cystectomy, ileal conduit, VP shunt removal, percutaneous  nephro lithotripsy. In the remote past the patient says he's had some treatment for a ulcer on this area and was treated with a skin graft. Readmission: 10/16/2021 upon evaluation today patient appears to be doing somewhat poorly in regard to a fairly large wound noted over the right ischial tuberosity location. This is a stage IV pressure ulcer. Of note this is also positive for osteomyelitis upon a CT scan which was performed during the time that he was admitted in the hospital on 09/19/2021. With that being said it is noted in the right inferior buttock that he has a decubitus ulcer which extends to the posterior toe inferior right ischium where there is evidence of osteomyelitis. When he was here in the office actually missed that this was positive I missed read it and thought it said that there was no osteomyelitis. That is not the case there is in fact osteomyelitis noted here. I actually did review his notes as well in epic. Looking to the discharge summary it appears that the patient was admitted from 09/19/2021 through 09/23/2021. He was discharged home with home health care according to the note. With that being said from what I understand his sister actually takes care of him at this [facility] ([id number]) [phone].pdf Page 2 of 12 point. He was also to follow-up with a primary care provider and here at the wound care center within a week. I am just now seeing him on the 13th almost a month after discharge. He does have a history of again osteomyelitis of this right hip/ischial tuberosity location. 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 The reason for his admission was actually worsening of the decubitus ulcer upon admission. He does have chronic osteomyelitis of this area. He was treated with  empiric antibiotics he had  moistened with Dakins Solution Secondary Dressing: Zetuvit Plus 4x4 (in/in) 3 x Per Week/30 Days Secured With: Medipore T - 67M Medipore H Soft Cloth Surgical T ape ape, 2x2 (in/yd) 3 x Per Week/30 Days Wound #7 - Gluteus Wound Laterality: Left Cleanser: Byram Ancillary Kit - 15 Day Supply (Generic) 3 x Per Week/30 Days Discharge Instructions: Use supplies as instructed; Kit contains: (15) Saline Bullets; (15) 3x3 Gauze; 15 pr Gloves Prim Dressing: Silvercel Small 2x2 (in/in) 3 x Per Week/30 Days ary Discharge Instructions: Apply Silvercel Small 2x2 (in/in) as instructed Secured With: Tegaderm Film Transparent 4x4.75 (in/in) 3 x Per Week/30 Days Discharge Instructions: Apply to wound bed Electronic Signature(s) Signed: 07/28/2023 6:30:03 PM By: Allen Derry PA-C Signed: 07/29/2023 10:28:30 AM By: Betha Loa Entered By: Betha Loa on 07/28/2023 16:09:08 Prescription 07/28/2023 -------------------------------------------------------------------------------- [name] PA-C Patient Name: Provider: 09-Jul-1966 [id number] Date of Birth: NPI#: [id number] Sex: DEA #: [phone] Phone #: License #: UPN: Patient Address: [address] [facility] [address] [facility] [place], [place] [address] [address], [address] [place], [place]  [address] [phone] [facility], ALB[facility] ([id number]) [id number] Page 6 of 12 Allergies Iodinated Contrast Media; Sulfa (Sulfonamide Antibiotics); latex; morphine Provider's Orders Hospital bed/mattress - ETHOS Hand Signature: Date(s): Prescription 07/28/2023 [name] PA-C Patient Name: Provider: 1966/04/12 [id number] Date of Birth: NPI#: [name] [id number] Sex: DEA #: [phone] Phone #: License #: UPN: Patient Address: [address] [facility] [address] [facility] [place], [place] [address] [address] [place], [place] [address] [phone] Allergies Iodinated Contrast Media; Sulfa (Sulfonamide Antibiotics); latex; morphine Provider's Orders Low air-loss mattress (Group 2) - HOSPITAL BED WITH GROUPE 2 AIR MATTRESS AND TRAPEZE BAR ETHOS Hand Signature: Date(s): Electronic Signature(s) Signed: 07/28/2023 6:30:03 PM By: Allen Derry PA-C Signed: 07/29/2023 10:28:30 AM By: Betha Loa Entered By: Betha Loa on 07/28/2023 16:09:08 -------------------------------------------------------------------------------- Problem List Details Patient Name: Date of Service: [name], [name] 07/28/2023 1:45 PM Medical Record Number: [id number] Patient Account Number: [id number] Date of Birth/Sex: Treating RN: 1965-11-20 (57 y.o. 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.0 Spina bifida occulta 03/31/2023 No Yes Z93.3 Colostomy status 03/31/2023 No Yes I10 Essential (primary) hypertension 03/31/2023 No Yes I25.10 Atherosclerotic heart disease of native coronary artery without angina pectoris 03/31/2023 No Yes Inactive Problems Resolved Problems Electronic Signature(s) Signed: 07/28/2023 6:18:49 PM By: Allen Derry PA-C Previous Signature: 07/28/2023 6:16:52 PM Version By: Allen Derry PA-C Entered By: Allen Derry on 07/28/2023 18:18:49 -------------------------------------------------------------------------------- Progress Note Details Patient Name: Date of Service: [name], [name] 07/28/2023 1:45 PM Medical Record Number: [id number] Patient Account Number: [id number] Date of Birth/Sex: Treating RN: 05/01/1966 (57 y.o. 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 and left gluteal ulcer History of Present Illness (HPI) 57 year old  Silvercel Small 2x2 (in/in) 3 x Per Week/30 Days ary Discharge Instructions: Apply Silvercel Small 2x2 (in/in) as instructed Secured With: Tegaderm Film Transparent 4x4.75 (in/in) 3 x Per Week/30 Days Discharge Instructions: Apply to wound bed 1. Based on what I am seeing I do believe that the patient is going to require intervention here and he needs to have aggressive offloading we have been doing everything we can to get him in a new bed that he does have an air mattress we are looking for a group 3 pressure relieving surface. 2. I am going to recommend as well that the patient should continue to utilize the silver alginate dressing for the time being I think that we can use Tegaderm this is over the new area on the left. 3. With regard to the primary wound that we  have been dealing with I think still the Dakin's moistened gauze dressing is probably the best way to go and then recovering this with either an ABD pad or Zetuvit. I think the ABD pad is probably better due to the size. We will see patient back for reevaluation in 1 week here in the clinic. If anything worsens or changes patient will contact our office for additional recommendations. Electronic Signature(s) Signed: 07/28/2023 6:20:48 PM By: Allen Derry PA-C Entered By: Allen Derry on 07/28/2023 18:20:48 -------------------------------------------------------------------------------- SuperBill Details Patient Name: Date of Service: [name], [name] 07/28/2023 Medical Record Number: [id number] Patient Account Number: [id number] Date of Birth/Sex: Treating RN: 05-10-1966 (57 y.o. [name] Primary Care Provider: Aram Beecham Other Clinician: Betha Loa Referring Provider: Treating Provider/Extender: Gabriel Earing in Treatment: 17 Diagnosis Coding ICD-10 Codes Code Description [phone] Other chronic osteomyelitis, other site L89.314 Pressure ulcer of right buttock, stage 4 Q76.0 Spina bifida occulta Z93.3 Colostomy status I10 Essential (primary) hypertension I25.10 Atherosclerotic heart disease of native coronary artery without angina pectoris Facility Procedures : CPT4 Code: [id number] Description: 99213 - WOUND CARE VISIT-LEV 3 EST PT Modifier: Quantity: 1 Physician Procedures : CPT4 Code Description Modifier [id number] 99213 - WC PHYS LEVEL 3 - EST PT ICD-10 Diagnosis Description M86.68 Other chronic osteomyelitis, other site L89.314 Pressure ulcer of right buttock, stage 4 Q76.0 Spina bifida occulta Z93.3 Colostomy status Quantity: 1 Electronic Signature(s) Signed: 07/28/2023 6:23:43 PM By: Antionette Fairy, Humberto [phone] By: Allen Derry PA-C [phone].pdf Page 12 of 12 Signed: 07/28/2023  6:23:43 Entered By: Allen Derry on 07/28/2023 18:23:42  [facility], [name] ([id number]) [id number] Page 1 of 12 Visit Report for 07/28/2023 Chief Complaint Document Details Patient Name: Date of Service: [name] [facility], DO [place] LD 07/28/2023 1:45 PM Medical Record Number: [id number] Patient Account Number: [id number] Date of Birth/Sex: Treating RN: 07/11/1966 (57 y.o. [name] Primary Care Provider: Aram Beecham Other Clinician: Betha Loa Referring Provider: Treating Provider/Extender: Gabriel Earing in Treatment: 17 Information Obtained from: Patient Chief Complaint Right ischial tuberosity pressure ulcer with chronic osteomyelitis and left gluteal ulcer Electronic Signature(s) Signed: 07/28/2023 6:19:10 PM By: Allen Derry PA-C Previous Signature: 07/28/2023 6:16:55 PM Version By: Allen Derry PA-C Entered By: Allen Derry on 07/28/2023 18:19:10 -------------------------------------------------------------------------------- HPI Details Patient Name: Date of Service: [name], [name] 07/28/2023 1:45 PM Medical Record Number: [id number] Patient Account Number: [id number] Date of Birth/Sex: Treating RN: [birth year] (57 y.o. [name] Primary Care Provider: Aram Beecham Other Clinician: Betha Loa Referring Provider: Treating Provider/Extender: Gabriel Earing in Treatment: 17 History of Present Illness HPI Description: 57 year old male with a history of spina bifida presenting to [place] with a history of a open wound on the left gluteal region near his upper thigh which she's had for several weeks. He was seen in the ED recently at [facility] and was put on doxycycline and was advised some local care. his past medical history is significant for spinal bifid, neurogenic bladder, kidney stones, sacral pressure sores, constipation. Past surgical history significant for partial cystectomy, ileal conduit, VP shunt removal, percutaneous  nephro lithotripsy. In the remote past the patient says he's had some treatment for a ulcer on this area and was treated with a skin graft. Readmission: 10/16/2021 upon evaluation today patient appears to be doing somewhat poorly in regard to a fairly large wound noted over the right ischial tuberosity location. This is a stage IV pressure ulcer. Of note this is also positive for osteomyelitis upon a CT scan which was performed during the time that he was admitted in the hospital on 09/19/2021. With that being said it is noted in the right inferior buttock that he has a decubitus ulcer which extends to the posterior toe inferior right ischium where there is evidence of osteomyelitis. When he was here in the office actually missed that this was positive I missed read it and thought it said that there was no osteomyelitis. That is not the case there is in fact osteomyelitis noted here. I actually did review his notes as well in epic. Looking to the discharge summary it appears that the patient was admitted from 09/19/2021 through 09/23/2021. He was discharged home with home health care according to the note. With that being said from what I understand his sister actually takes care of him at this [facility] ([id number]) [phone].pdf Page 2 of 12 point. He was also to follow-up with a primary care provider and here at the wound care center within a week. I am just now seeing him on the 13th almost a month after discharge. He does have a history of again osteomyelitis of this right hip/ischial tuberosity location. 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 The reason for his admission was actually worsening of the decubitus ulcer upon admission. He does have chronic osteomyelitis of this area. He was treated with  empiric antibiotics he had  [place] [facility], [name] ([id number]) [id number] Page 1 of 12 Visit Report for 07/28/2023 Chief Complaint Document Details Patient Name: Date of Service: [name] [facility], DO [place] LD 07/28/2023 1:45 PM Medical Record Number: [id number] Patient Account Number: [id number] Date of Birth/Sex: Treating RN: 07/11/1966 (57 y.o. [name] Primary Care Provider: Aram Beecham Other Clinician: Betha Loa Referring Provider: Treating Provider/Extender: Gabriel Earing in Treatment: 17 Information Obtained from: Patient Chief Complaint Right ischial tuberosity pressure ulcer with chronic osteomyelitis and left gluteal ulcer Electronic Signature(s) Signed: 07/28/2023 6:19:10 PM By: Allen Derry PA-C Previous Signature: 07/28/2023 6:16:55 PM Version By: Allen Derry PA-C Entered By: Allen Derry on 07/28/2023 18:19:10 -------------------------------------------------------------------------------- HPI Details Patient Name: Date of Service: [name], [name] 07/28/2023 1:45 PM Medical Record Number: [id number] Patient Account Number: [id number] Date of Birth/Sex: Treating RN: [birth year] (57 y.o. [name] Primary Care Provider: Aram Beecham Other Clinician: Betha Loa Referring Provider: Treating Provider/Extender: Gabriel Earing in Treatment: 17 History of Present Illness HPI Description: 57 year old male with a history of spina bifida presenting to [place] with a history of a open wound on the left gluteal region near his upper thigh which she's had for several weeks. He was seen in the ED recently at [facility] and was put on doxycycline and was advised some local care. his past medical history is significant for spinal bifid, neurogenic bladder, kidney stones, sacral pressure sores, constipation. Past surgical history significant for partial cystectomy, ileal conduit, VP shunt removal, percutaneous  nephro lithotripsy. In the remote past the patient says he's had some treatment for a ulcer on this area and was treated with a skin graft. Readmission: 10/16/2021 upon evaluation today patient appears to be doing somewhat poorly in regard to a fairly large wound noted over the right ischial tuberosity location. This is a stage IV pressure ulcer. Of note this is also positive for osteomyelitis upon a CT scan which was performed during the time that he was admitted in the hospital on 09/19/2021. With that being said it is noted in the right inferior buttock that he has a decubitus ulcer which extends to the posterior toe inferior right ischium where there is evidence of osteomyelitis. When he was here in the office actually missed that this was positive I missed read it and thought it said that there was no osteomyelitis. That is not the case there is in fact osteomyelitis noted here. I actually did review his notes as well in epic. Looking to the discharge summary it appears that the patient was admitted from 09/19/2021 through 09/23/2021. He was discharged home with home health care according to the note. With that being said from what I understand his sister actually takes care of him at this [facility] ([id number]) [phone].pdf Page 2 of 12 point. He was also to follow-up with a primary care provider and here at the wound care center within a week. I am just now seeing him on the 13th almost a month after discharge. He does have a history of again osteomyelitis of this right hip/ischial tuberosity location. 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 The reason for his admission was actually worsening of the decubitus ulcer upon admission. He does have chronic osteomyelitis of this area. He was treated with  empiric antibiotics he had  [place] [facility], [name] ([id number]) [id number] Page 1 of 12 Visit Report for 07/28/2023 Chief Complaint Document Details Patient Name: Date of Service: [name] [facility], DO [place] LD 07/28/2023 1:45 PM Medical Record Number: [id number] Patient Account Number: [id number] Date of Birth/Sex: Treating RN: 07/11/1966 (57 y.o. [name] Primary Care Provider: Aram Beecham Other Clinician: Betha Loa Referring Provider: Treating Provider/Extender: Gabriel Earing in Treatment: 17 Information Obtained from: Patient Chief Complaint Right ischial tuberosity pressure ulcer with chronic osteomyelitis and left gluteal ulcer Electronic Signature(s) Signed: 07/28/2023 6:19:10 PM By: Allen Derry PA-C Previous Signature: 07/28/2023 6:16:55 PM Version By: Allen Derry PA-C Entered By: Allen Derry on 07/28/2023 18:19:10 -------------------------------------------------------------------------------- HPI Details Patient Name: Date of Service: [name], [name] 07/28/2023 1:45 PM Medical Record Number: [id number] Patient Account Number: [id number] Date of Birth/Sex: Treating RN: [birth year] (57 y.o. [name] Primary Care Provider: Aram Beecham Other Clinician: Betha Loa Referring Provider: Treating Provider/Extender: Gabriel Earing in Treatment: 17 History of Present Illness HPI Description: 57 year old male with a history of spina bifida presenting to [place] with a history of a open wound on the left gluteal region near his upper thigh which she's had for several weeks. He was seen in the ED recently at [facility] and was put on doxycycline and was advised some local care. his past medical history is significant for spinal bifid, neurogenic bladder, kidney stones, sacral pressure sores, constipation. Past surgical history significant for partial cystectomy, ileal conduit, VP shunt removal, percutaneous  nephro lithotripsy. In the remote past the patient says he's had some treatment for a ulcer on this area and was treated with a skin graft. Readmission: 10/16/2021 upon evaluation today patient appears to be doing somewhat poorly in regard to a fairly large wound noted over the right ischial tuberosity location. This is a stage IV pressure ulcer. Of note this is also positive for osteomyelitis upon a CT scan which was performed during the time that he was admitted in the hospital on 09/19/2021. With that being said it is noted in the right inferior buttock that he has a decubitus ulcer which extends to the posterior toe inferior right ischium where there is evidence of osteomyelitis. When he was here in the office actually missed that this was positive I missed read it and thought it said that there was no osteomyelitis. That is not the case there is in fact osteomyelitis noted here. I actually did review his notes as well in epic. Looking to the discharge summary it appears that the patient was admitted from 09/19/2021 through 09/23/2021. He was discharged home with home health care according to the note. With that being said from what I understand his sister actually takes care of him at this [facility] ([id number]) [phone].pdf Page 2 of 12 point. He was also to follow-up with a primary care provider and here at the wound care center within a week. I am just now seeing him on the 13th almost a month after discharge. He does have a history of again osteomyelitis of this right hip/ischial tuberosity location. 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 The reason for his admission was actually worsening of the decubitus ulcer upon admission. He does have chronic osteomyelitis of this area. He was treated with  empiric antibiotics he had  unfortunately. This again was a condition about seeing him back that he had to make sure that he showed up for all visits. That is not happening at the moment and I have discussion with him today about that. I discussed that if he is [place], [place] ([id number]) [id number] Page 4 of 12 not going to show up for his visits that we are going to have to discharge him that was the original agreement and that is still where things stand at this point to be honest. The patient voiced understanding. I told him that we are going to give him 1 more shot today before we would proceed to anything like that but again if he is not coming to the appointments as directed that there is really no point and has continued to have them on the books as far as trying to get this healed is concerned. He voiced understanding. He still tells me that his dressings were also not staying in place in fact he tells me that every time he gets out of the bed the dressing is already off. He came in today with no dressing on. Noncompliance has been a significant issue through the course of the treatment with this gentleman and still continues to be so. Electronic Signature(s) Signed: 07/28/2023 6:19:39 PM By: Allen Derry PA-C Entered By: Allen Derry on 07/28/2023 18:19:39 -------------------------------------------------------------------------------- Physical Exam Details Patient Name: [name] 07/28/2023 1:45 PM Medical Record Number: [id number] Patient Account Number:  [id number] Date of Birth/Sex: Treating RN: 04/14/1966 (57 y.o. [name] Primary Care Provider: Aram Beecham Other Clinician: Betha Loa Referring Provider: Treating Provider/Extender: Hermine Messick Weeks in Treatment: 17 Constitutional Well-nourished and well-hydrated in no acute distress. Respiratory normal breathing without difficulty. Psychiatric this patient is able to make decisions and demonstrates good insight into disease process. Alert and Oriented x 3. pleasant and cooperative. Notes Upon inspection patient's wound bed actually showed signs of poor granulation and epithelization at this point. I do not see obvious evidence of infection though we know he has been infected in the past. The new wounds appear to be new pressure injuries unfortunately. Electronic Signature(s) Signed: 07/28/2023 6:19:59 PM By: Allen Derry PA-C Entered By: Allen Derry on 07/28/2023 18:19:58 -------------------------------------------------------------------------------- Physician Orders Details Patient Name: Date of Service: [name], [name] 07/28/2023 1:45 PM Medical Record Number: [id number] Patient Account Number: [id number] Date of Birth/Sex: Treating RN: 1965-11-12 (57 y.o. [name] Primary Care Provider: Aram Beecham Other Clinician: Betha Loa Referring Provider: Treating Provider/Extender: Gabriel Earing in Treatment: 17 The following information was scribed by: Betha Loa The information was scribed for: Tyge, Nearhoof, [name] ([id number]) [id number] Page 5 of 12 Verbal / Phone Orders: Yes Clinician: Angelina Pih Read Back and Verified: Yes Diagnosis Coding Follow-up Appointments Return Appointment in 1 week. Bathing/ [employer] wounds with antibacterial soap and water. Anesthetic (Use 'Patient Medications' Section for Anesthetic Order Entry) Lidocaine  applied to wound bed Off-Loading Gel wheelchair cushion Hospital bed/mattress - ETHOS Low air-loss mattress (Group 2) - HOSPITAL BED WITH GROUPE 2 AIR MATTRESS AND TRAPEZE BAR ETHOS Turn and reposition every 2 hours Wound Treatment Wound #5 - Gluteus Wound Laterality: Right, Midline Cleanser: Byram Ancillary Kit - 15 Day Supply (Generic) 3 x Per Week/30 Days Discharge Instructions: Use supplies as instructed; Kit contains: (15) Saline Bullets; (15) 3x3 Gauze; 15 pr Gloves Prim Dressing: Gauze 3 x Per Week/30 Days ary Discharge Instructions:  moistened with Dakins Solution Secondary Dressing: Zetuvit Plus 4x4 (in/in) 3 x Per Week/30 Days Secured With: Medipore T - 67M Medipore H Soft Cloth Surgical T ape ape, 2x2 (in/yd) 3 x Per Week/30 Days Wound #7 - Gluteus Wound Laterality: Left Cleanser: Byram Ancillary Kit - 15 Day Supply (Generic) 3 x Per Week/30 Days Discharge Instructions: Use supplies as instructed; Kit contains: (15) Saline Bullets; (15) 3x3 Gauze; 15 pr Gloves Prim Dressing: Silvercel Small 2x2 (in/in) 3 x Per Week/30 Days ary Discharge Instructions: Apply Silvercel Small 2x2 (in/in) as instructed Secured With: Tegaderm Film Transparent 4x4.75 (in/in) 3 x Per Week/30 Days Discharge Instructions: Apply to wound bed Electronic Signature(s) Signed: 07/28/2023 6:30:03 PM By: Allen Derry PA-C Signed: 07/29/2023 10:28:30 AM By: Betha Loa Entered By: Betha Loa on 07/28/2023 16:09:08 Prescription 07/28/2023 -------------------------------------------------------------------------------- [name] PA-C Patient Name: Provider: 09-Jul-1966 [id number] Date of Birth: NPI#: [id number] Sex: DEA #: [phone] Phone #: License #: UPN: Patient Address: [address] [facility] [address] [facility] [place], [place] [address] [address], [address] [place], [place]  [address] [phone] BURRIS, ALBERS ([id number]) [id number] Page 6 of 12 Allergies Iodinated Contrast Media; Sulfa (Sulfonamide Antibiotics); latex; morphine Provider's Orders Hospital bed/mattress - ETHOS Hand Signature: Date(s): Prescription 07/28/2023 [name] PA-C Patient Name: Provider: 1966/04/12 [id number] Date of Birth: NPI#: [name] [id number] Sex: DEA #: [phone] Phone #: License #: UPN: Patient Address: [address] [facility] [address] [facility] [place], [place] [address] [address] [place], [place] [address] [phone] Allergies Iodinated Contrast Media; Sulfa (Sulfonamide Antibiotics); latex; morphine Provider's Orders Low air-loss mattress (Group 2) - HOSPITAL BED WITH GROUPE 2 AIR MATTRESS AND TRAPEZE BAR ETHOS Hand Signature: Date(s): Electronic Signature(s) Signed: 07/28/2023 6:30:03 PM By: Allen Derry PA-C Signed: 07/29/2023 10:28:30 AM By: Betha Loa Entered By: Betha Loa on 07/28/2023 16:09:08 -------------------------------------------------------------------------------- Problem List Details Patient Name: Date of Service: [name], [name] 07/28/2023 1:45 PM Medical Record Number: [id number] Patient Account Number: [id number] Date of Birth/Sex: Treating RN: 1965-11-20 (57 y.o. 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.0 Spina bifida occulta 03/31/2023 No Yes Z93.3 Colostomy status 03/31/2023 No Yes I10 Essential (primary) hypertension 03/31/2023 No Yes I25.10 Atherosclerotic heart disease of native coronary artery without angina pectoris 03/31/2023 No Yes Inactive Problems Resolved Problems Electronic Signature(s) Signed: 07/28/2023 6:18:49 PM By: Allen Derry PA-C Previous Signature: 07/28/2023 6:16:52 PM Version By: Allen Derry PA-C Entered By: Allen Derry on 07/28/2023 18:18:49 -------------------------------------------------------------------------------- Progress Note Details Patient Name: Date of Service: [name], [name] 07/28/2023 1:45 PM Medical Record Number: [id number] Patient Account Number: [id number] Date of Birth/Sex: Treating RN: 05/01/1966 (57 y.o. 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 and left gluteal ulcer History of Present Illness (HPI) 57 year old  moistened with Dakins Solution Secondary Dressing: Zetuvit Plus 4x4 (in/in) 3 x Per Week/30 Days Secured With: Medipore T - 67M Medipore H Soft Cloth Surgical T ape ape, 2x2 (in/yd) 3 x Per Week/30 Days Wound #7 - Gluteus Wound Laterality: Left Cleanser: Byram Ancillary Kit - 15 Day Supply (Generic) 3 x Per Week/30 Days Discharge Instructions: Use supplies as instructed; Kit contains: (15) Saline Bullets; (15) 3x3 Gauze; 15 pr Gloves Prim Dressing: Silvercel Small 2x2 (in/in) 3 x Per Week/30 Days ary Discharge Instructions: Apply Silvercel Small 2x2 (in/in) as instructed Secured With: Tegaderm Film Transparent 4x4.75 (in/in) 3 x Per Week/30 Days Discharge Instructions: Apply to wound bed Electronic Signature(s) Signed: 07/28/2023 6:30:03 PM By: Allen Derry PA-C Signed: 07/29/2023 10:28:30 AM By: Betha Loa Entered By: Betha Loa on 07/28/2023 16:09:08 Prescription 07/28/2023 -------------------------------------------------------------------------------- [name] PA-C Patient Name: Provider: 09-Jul-1966 [id number] Date of Birth: NPI#: [id number] Sex: DEA #: [phone] Phone #: License #: UPN: Patient Address: [address] [facility] [address] [facility] [place], [place] [address] [address], [address] [place], [place]  [address] [phone] BURRIS, ALBERS ([id number]) [id number] Page 6 of 12 Allergies Iodinated Contrast Media; Sulfa (Sulfonamide Antibiotics); latex; morphine Provider's Orders Hospital bed/mattress - ETHOS Hand Signature: Date(s): Prescription 07/28/2023 [name] PA-C Patient Name: Provider: 1966/04/12 [id number] Date of Birth: NPI#: [name] [id number] Sex: DEA #: [phone] Phone #: License #: UPN: Patient Address: [address] [facility] [address] [facility] [place], [place] [address] [address] [place], [place] [address] [phone] Allergies Iodinated Contrast Media; Sulfa (Sulfonamide Antibiotics); latex; morphine Provider's Orders Low air-loss mattress (Group 2) - HOSPITAL BED WITH GROUPE 2 AIR MATTRESS AND TRAPEZE BAR ETHOS Hand Signature: Date(s): Electronic Signature(s) Signed: 07/28/2023 6:30:03 PM By: Allen Derry PA-C Signed: 07/29/2023 10:28:30 AM By: Betha Loa Entered By: Betha Loa on 07/28/2023 16:09:08 -------------------------------------------------------------------------------- Problem List Details Patient Name: Date of Service: [name], [name] 07/28/2023 1:45 PM Medical Record Number: [id number] Patient Account Number: [id number] Date of Birth/Sex: Treating RN: 1965-11-20 (57 y.o. 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.0 Spina bifida occulta 03/31/2023 No Yes Z93.3 Colostomy status 03/31/2023 No Yes I10 Essential (primary) hypertension 03/31/2023 No Yes I25.10 Atherosclerotic heart disease of native coronary artery without angina pectoris 03/31/2023 No Yes Inactive Problems Resolved Problems Electronic Signature(s) Signed: 07/28/2023 6:18:49 PM By: Allen Derry PA-C Previous Signature: 07/28/2023 6:16:52 PM Version By: Allen Derry PA-C Entered By: Allen Derry on 07/28/2023 18:18:49 -------------------------------------------------------------------------------- Progress Note Details Patient Name: Date of Service: [name], [name] 07/28/2023 1:45 PM Medical Record Number: [id number] Patient Account Number: [id number] Date of Birth/Sex: Treating RN: 05/01/1966 (57 y.o. 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 and left gluteal ulcer History of Present Illness (HPI) 57 year old  unfortunately. This again was a condition about seeing him back that he had to make sure that he showed up for all visits. That is not happening at the moment and I have discussion with him today about that. I discussed that if he is [place], [place] ([id number]) [id number] Page 4 of 12 not going to show up for his visits that we are going to have to discharge him that was the original agreement and that is still where things stand at this point to be honest. The patient voiced understanding. I told him that we are going to give him 1 more shot today before we would proceed to anything like that but again if he is not coming to the appointments as directed that there is really no point and has continued to have them on the books as far as trying to get this healed is concerned. He voiced understanding. He still tells me that his dressings were also not staying in place in fact he tells me that every time he gets out of the bed the dressing is already off. He came in today with no dressing on. Noncompliance has been a significant issue through the course of the treatment with this gentleman and still continues to be so. Electronic Signature(s) Signed: 07/28/2023 6:19:39 PM By: Allen Derry PA-C Entered By: Allen Derry on 07/28/2023 18:19:39 -------------------------------------------------------------------------------- Physical Exam Details Patient Name: [name] 07/28/2023 1:45 PM Medical Record Number: [id number] Patient Account Number:  [id number] Date of Birth/Sex: Treating RN: 04/14/1966 (57 y.o. [name] Primary Care Provider: Aram Beecham Other Clinician: Betha Loa Referring Provider: Treating Provider/Extender: Hermine Messick Weeks in Treatment: 17 Constitutional Well-nourished and well-hydrated in no acute distress. Respiratory normal breathing without difficulty. Psychiatric this patient is able to make decisions and demonstrates good insight into disease process. Alert and Oriented x 3. pleasant and cooperative. Notes Upon inspection patient's wound bed actually showed signs of poor granulation and epithelization at this point. I do not see obvious evidence of infection though we know he has been infected in the past. The new wounds appear to be new pressure injuries unfortunately. Electronic Signature(s) Signed: 07/28/2023 6:19:59 PM By: Allen Derry PA-C Entered By: Allen Derry on 07/28/2023 18:19:58 -------------------------------------------------------------------------------- Physician Orders Details Patient Name: Date of Service: [name], [name] 07/28/2023 1:45 PM Medical Record Number: [id number] Patient Account Number: [id number] Date of Birth/Sex: Treating RN: 1965-11-12 (57 y.o. [name] Primary Care Provider: Aram Beecham Other Clinician: Betha Loa Referring Provider: Treating Provider/Extender: Gabriel Earing in Treatment: 17 The following information was scribed by: Betha Loa The information was scribed for: Tyge, Near[place]of, [name] ([id number]) [id number] Page 5 of 12 Verbal / Phone Orders: Yes Clinician: Angelina Pih Read Back and Verified: Yes Diagnosis Coding Follow-up Appointments Return Appointment in 1 week. Bathing/ [employer] wounds with antibacterial soap and water. Anesthetic (Use 'Patient Medications' Section for Anesthetic Order Entry) Lidocaine  applied to wound bed Off-Loading Gel wheelchair cushion Hospital bed/mattress - ETHOS Low air-loss mattress (Group 2) - HOSPITAL BED WITH GROUPE 2 AIR MATTRESS AND TRAPEZE BAR ETHOS Turn and reposition every 2 hours Wound Treatment Wound #5 - Gluteus Wound Laterality: Right, Midline Cleanser: Byram Ancillary Kit - 15 Day Supply (Generic) 3 x Per Week/30 Days Discharge Instructions: Use supplies as instructed; Kit contains: (15) Saline Bullets; (15) 3x3 Gauze; 15 pr Gloves Prim Dressing: Gauze 3 x Per Week/30 Days ary Discharge Instructions:  unfortunately. This again was a condition about seeing him back that he had to make sure that he showed up for all visits. That is not happening at the moment and I have discussion with him today about that. I discussed that if he is [place], [place] ([id number]) [id number] Page 4 of 12 not going to show up for his visits that we are going to have to discharge him that was the original agreement and that is still where things stand at this point to be honest. The patient voiced understanding. I told him that we are going to give him 1 more shot today before we would proceed to anything like that but again if he is not coming to the appointments as directed that there is really no point and has continued to have them on the books as far as trying to get this healed is concerned. He voiced understanding. He still tells me that his dressings were also not staying in place in fact he tells me that every time he gets out of the bed the dressing is already off. He came in today with no dressing on. Noncompliance has been a significant issue through the course of the treatment with this gentleman and still continues to be so. Electronic Signature(s) Signed: 07/28/2023 6:19:39 PM By: Allen Derry PA-C Entered By: Allen Derry on 07/28/2023 18:19:39 -------------------------------------------------------------------------------- Physical Exam Details Patient Name: [name] 07/28/2023 1:45 PM Medical Record Number: [id number] Patient Account Number:  [id number] Date of Birth/Sex: Treating RN: 04/14/1966 (57 y.o. [name] Primary Care Provider: Aram Beecham Other Clinician: Betha Loa Referring Provider: Treating Provider/Extender: Hermine Messick Weeks in Treatment: 17 Constitutional Well-nourished and well-hydrated in no acute distress. Respiratory normal breathing without difficulty. Psychiatric this patient is able to make decisions and demonstrates good insight into disease process. Alert and Oriented x 3. pleasant and cooperative. Notes Upon inspection patient's wound bed actually showed signs of poor granulation and epithelization at this point. I do not see obvious evidence of infection though we know he has been infected in the past. The new wounds appear to be new pressure injuries unfortunately. Electronic Signature(s) Signed: 07/28/2023 6:19:59 PM By: Allen Derry PA-C Entered By: Allen Derry on 07/28/2023 18:19:58 -------------------------------------------------------------------------------- Physician Orders Details Patient Name: Date of Service: [name], [name] 07/28/2023 1:45 PM Medical Record Number: [id number] Patient Account Number: [id number] Date of Birth/Sex: Treating RN: 1965-11-12 (57 y.o. [name] Primary Care Provider: Aram Beecham Other Clinician: Betha Loa Referring Provider: Treating Provider/Extender: Gabriel Earing in Treatment: 17 The following information was scribed by: Betha Loa The information was scribed for: Tyge, Near[place], [name] ([id number]) [id number] Page 5 of 12 Verbal / Phone Orders: Yes Clinician: Angelina Pih Read Back and Verified: Yes Diagnosis Coding Follow-up Appointments Return Appointment in 1 week. Bathing/ [employer] wounds with antibacterial soap and water. Anesthetic (Use 'Patient Medications' Section for Anesthetic Order Entry) Lidocaine  applied to wound bed Off-Loading Gel wheelchair cushion Hospital bed/mattress - ETHOS Low air-loss mattress (Group 2) - HOSPITAL BED WITH GROUPE 2 AIR MATTRESS AND TRAPEZE BAR ETHOS Turn and reposition every 2 hours Wound Treatment Wound #5 - Gluteus Wound Laterality: Right, Midline Cleanser: Byram Ancillary Kit - 15 Day Supply (Generic) 3 x Per Week/30 Days Discharge Instructions: Use supplies as instructed; Kit contains: (15) Saline Bullets; (15) 3x3 Gauze; 15 pr Gloves Prim Dressing: Gauze 3 x Per Week/30 Days ary Discharge Instructions:

## 2023-07-29 NOTE — Progress Notes (Signed)
Wound Cleansing / Measurement []  - 0 Simple Wound Cleansing - one wound X- 2 5 Complex Wound Cleansing - multiple wounds X- 1 5 Wound Imaging (photographs - any number of wounds) []  - 0 Wound Tracing (instead of photographs) []  - 0 Simple Wound Measurement - one wound X- 2 5 Complex Wound Measurement - multiple wounds INTERVENTIONS - Wound Dressings []  - 0 Small Wound Dressing one or multiple wounds X- 2 15 Medium Wound Dressing one or multiple wounds []  - 0 Large Wound Dressing one or multiple wounds []  - 0 Application of Medications - topical []  - 0 Application of Medications - injection INTERVENTIONS - Miscellaneous []  - 0 External ear exam CHESTER, COUPLAND ([id number]) [id number] Page 3 of 10 []  - 0 Specimen Collection (cultures, biopsies, blood, body fluids, etc.) []  - 0 Specimen(s) / Culture(s) sent or taken to Lab for analysis []  - 0 Patient Transfer (multiple staff / Michiel Sites Lift / Similar devices) []  - 0 Simple Staple / Suture removal (25 or less) []  - 0 Complex Staple / Suture removal (26 or more) []  - 0 Hypo / Hyperglycemic Management (close monitor of Blood Glucose) []  - 0 Ankle / Brachial Index (ABI) - do not check if billed separately X- 1 5 Vital Signs Has the patient been seen at the hospital within the last three years: Yes Total Score: 110 Level Of Care: New/Established - Level 3 Electronic Signature(s) Signed: 07/29/2023 10:28:30 AM By: Betha Loa Entered By: Betha Loa on 07/28/2023 11:36:36 -------------------------------------------------------------------------------- Encounter Discharge Information Details Patient Name: Date of Service: [name] TH, DO  NA LD 07/28/2023 1:45 PM Medical Record Number: [id number] Patient Account Number: [id number] Date of Birth/Sex: Treating RN: 1966-06-10 (57 y.o. [name] Primary Care Wynona Duhamel: Aram Beecham Other Clinician: Betha Loa Referring Tanvi Gatling: Treating Cardin Nitschke/Extender: Gabriel Earing in Treatment: 17 Encounter Discharge Information Items Discharge Condition: Stable Ambulatory Status: Wheelchair Discharge Destination: Home Transportation: Other Accompanied By: self Schedule Follow-up Appointment: Yes Clinical Summary of Care: Electronic Signature(s) Signed: 07/29/2023 10:28:30 AM By: Betha Loa Entered By: Betha Loa on 07/28/2023 11:51:42 Lower Extremity Assessment Details -------------------------------------------------------------------------------- [name] ([id number]) [id number] Page 4 of 10 Patient Name: Date of ServiceEulas [name] [facility], [name] 07/28/2023 1:45 PM Medical Record Number: [id number] Patient Account Number: [id number] Date of Birth/Sex: Treating RN: July 22, 1966 (57 y.o. [name] Primary Care Eileene Kisling: Aram Beecham Other Clinician: Betha Loa Referring Ender Rorke: Treating Keyuna Cuthrell/Extender: Hermine Messick Weeks in Treatment: 17 Electronic Signature(s) Signed: 07/28/2023 4:44:43 PM By: Angelina Pih Signed: 07/29/2023 10:28:30 AM By: Betha Loa Entered By: Betha Loa on 07/28/2023 11:06:32 -------------------------------------------------------------------------------- Multi Wound Chart Details Patient Name: Date of Service: [name] TH, [name] 07/28/2023 1:45 PM Medical Record Number: [id number] Patient Account Number: [id number] Date of Birth/Sex: Treating RN: 1966-04-11 (57 y.o. [name] Primary Care Barry Culverhouse: Aram Beecham Other Clinician: Betha Loa Referring [name]: Treating Vanna Sailer/Extender: Hermine Messick Weeks in Treatment: 17 Vital Signs Height(in): 59 Pulse(bpm): 103 Weight(lbs): 90 Blood Pressure(mmHg): 99/61 Body Mass Index(BMI): 18.2 Temperature(F): 98.1 Respiratory Rate(breaths/min): 18 [5:Photos:] [N/A:N/A] Right, Midline Gluteus Left Gluteus N/A Wound Location: Gradually Appeared Pressure Injury N/A Wounding Event: Pressure Ulcer Pressure Ulcer N/A Primary Etiology: Cataracts, Glaucoma, Coronary Artery Cataracts, Glaucoma, Coronary Artery N/A Comorbid History: Disease, Hypertension, History of Disease, Hypertension, History of pressure wounds, Osteoarthritis, pressure wounds, Osteoarthritis, Paraplegia Paraplegia 10/04/2021 07/28/2023 N/A Date Acquired: 17 0 N/A Weeks of Treatment: Open Open N/A Wound Status: No No N/A Wound  Bone Exposed: No Treatment Notes Wound #7 (Gluteus) Wound Laterality: Left Cleanser Byram Ancillary Kit - 15 Day Supply Discharge Instruction: Use supplies as instructed; Kit contains: (15) Saline Bullets; (15) 3x3 Gauze; 15 pr Gloves [place], [place] ([id number]) [id number] Page 10 of 10 Peri-Wound Care Topical Primary Dressing Silvercel Small 2x2 (in/in) Discharge Instruction: Apply Silvercel Small 2x2 (in/in) as instructed Secondary Dressing Secured With Tegaderm Film Transparent 4x4.75 (in/in) Discharge Instruction: Apply to wound bed Compression Wrap Compression Stockings Add-Ons Electronic Signature(s) Signed: 07/28/2023 6:18:08 PM By: Allen Derry PA-C Signed: 07/29/2023 1:21:28 PM By: Angelina Pih Previous Signature: 07/28/2023 4:44:43 PM Version By: Angelina Pih Entered By: Allen Derry on 07/28/2023 15:18:08 -------------------------------------------------------------------------------- Vitals Details Patient Name: Date of Service: [name] TH, [name] 07/28/2023 1:45 PM Medical Record Number: [id number] Patient Account Number: [id number] Date of Birth/Sex: Treating RN: [birth year] (57 y.o. [name] Primary Care Kajal Scalici: Aram Beecham Other Clinician: Betha Loa Referring Dj Senteno: Treating [name]/Extender: Hermine Messick Weeks in  Treatment: 17 Vital Signs Time Taken: 13:51 Temperature (F): 98.1 Height (in): 59 Pulse (bpm): 103 Weight (lbs): 90 Respiratory Rate (breaths/min): 18 Body Mass Index (BMI): 18.2 Blood Pressure (mmHg): 99/61 Reference Range: 80 - 120 mg / dl Electronic Signature(s) Signed: 07/29/2023 10:28:30 AM By: Betha Loa Entered By: Betha Loa on 07/28/2023 10:54:50  Recurrence: 5.5x9.2x1.6 1.5x1x0.1 N/A Measurements L x W x D (cm) 39.741 1.178 N/A A (cm) : rea 63.586 0.118 N/A Volume (cm) : -304.80% N/A N/A % Reduction in A rea: -331.80% N/A N/A % Reduction in Volume: 7 Starting Position 1 (o'clock): 9 Ending Position 1 (o'clock): 2.1 Maximum Distance 1 (cm): Yes No N/A Undermining: Category/Stage IV Category/Stage I N/A Classification: Large Medium N/A Exudate A mount: Serosanguineous Serosanguineous N/A Exudate Type: red, brown red, brown N/A Exudate Color: [name] ([id number]) [id number] Page 5 of 10 Medium (34-66%) N/A N/A Granulation Amount: Red, Pink N/A N/A Granulation Quality: Medium (34-66%) N/A N/A Necrotic Amount: Fat Layer (Subcutaneous Tissue): Yes Fat Layer (Subcutaneous Tissue): Yes N/A Exposed Structures: Muscle: Yes Fascia: No Bone: Yes Tendon: No Fascia: No Muscle: No Tendon: No Joint: No Joint: No Bone: No None None N/A Epithelialization: Treatment Notes Electronic Signature(s) Signed: 07/29/2023 10:28:30 AM By: Betha Loa Entered By: Betha Loa on 07/28/2023  11:07:42 -------------------------------------------------------------------------------- Multi-Disciplinary Care Plan Details Patient Name: Date of Service: [name] TH, [name] 07/28/2023 1:45 PM Medical Record Number: [id number] Patient Account Number: [id number] Date of Birth/Sex: Treating RN: [birth year] (57 y.o. [name] Primary Care Alp Goldwater: Aram Beecham Other Clinician: Betha Loa Referring Errica Dutil: Treating Caley Ciaramitaro/Extender: Gabriel Earing in Treatment: 17 Active Inactive Pressure Nursing Diagnoses: Potential for impaired tissue integrity related to pressure, friction, moisture, and shear Goals: Patient will remain free from development of additional pressure ulcers Date Initiated: 03/31/2023 Target Resolution Date: 07/01/2023 Goal Status: Active Interventions: Assess: immobility, friction, shearing, incontinence upon admission and as needed Assess offloading mechanisms upon admission and as needed Assess potential for pressure ulcer upon admission and as needed Notes: Wound/Skin Impairment Nursing Diagnoses: Knowledge deficit related to ulceration/compromised skin integrity Goals: Patient/caregiver will verbalize understanding of skin care regimen Date Initiated: 03/31/2023 Target Resolution Date: 07/01/2023 Goal Status: Active Ulcer/skin breakdown will have a volume reduction of 30% by week 4 Date Initiated: 03/31/2023 Date Inactivated: 06/02/2023 Target Resolution Date: 05/31/2023 Goal Status: Unmet Unmet Reason: comorbidities Ulcer/skin breakdown will have a volume reduction of 50% by week 8 Date Initiated: 03/31/2023 Target Resolution Date: 07/01/2023 Goal Status: Active [place], [name] ([id number]) [id number] Page 6 of 10 Ulcer/skin breakdown will have a volume reduction of 80% by week 12 Date Initiated: 03/31/2023 Target Resolution Date: 07/31/2023 Goal Status: Active Ulcer/skin breakdown will  heal within 14 weeks Date Initiated: 03/31/2023 Target Resolution Date: 08/31/2023 Goal Status: Active Interventions: Assess patient/caregiver ability to obtain necessary supplies Assess patient/caregiver ability to perform ulcer/skin care regimen upon admission and as needed Assess ulceration(s) every visit Notes: Electronic Signature(s) Signed: 07/28/2023 4:44:43 PM By: Angelina Pih Signed: 07/29/2023 10:28:30 AM By: Betha Loa Entered By: Betha Loa on 07/28/2023 11:36:52 -------------------------------------------------------------------------------- Pain Assessment Details Patient Name: Date of Service: [name] TH, [name] 07/28/2023 1:45 PM Medical Record Number: [id number] Patient Account Number: [id number] Date of Birth/Sex: Treating RN: 1966-04-16 (57 y.o. [name] Primary Care Cristi Gwynn: Aram Beecham Other Clinician: Betha Loa Referring Afreen Siebels: Treating Lauranne Beyersdorf/Extender: Hermine Messick Weeks in Treatment: 17 Active Problems Location of Pain Severity and Description of Pain Patient Has Paino No Site Locations Pain Management and Medication Current Pain Management: Electronic Signature(s) Signed: 07/28/2023 4:44:43 PM By: Angelina Pih Signed: 07/29/2023 10:28:30 AM By: Betha Loa Entered By: Betha Loa on 07/28/2023 10:54:54 [name] ([id number]) [id number] Page 7 of 10 -------------------------------------------------------------------------------- Patient/Caregiver Education Details Patient Name: Date of Service: FA IRCLO TH, [name] 10/24/2024andnbsp1:45 PM Medical Record Number:  [id number] Patient Account Number: [id number] Date of Birth/Gender: Treating RN: 1966-05-10 (57 y.o. [name] Primary Care Physician: Aram Beecham Other Clinician: Betha Loa Referring Physician: Treating Physician/Extender: Gabriel Earing in Treatment:  17 Education Assessment Education Provided To: Patient Education Topics Provided Wound/Skin Impairment: Handouts: Other: continue wound care as directed Methods: Explain/Verbal Responses: State content correctly Electronic Signature(s) Signed: 07/29/2023 10:28:30 AM By: Betha Loa Entered By: Betha Loa on 07/28/2023 11:46:43 -------------------------------------------------------------------------------- Wound Assessment Details Patient Name: Date of ServiceEulas [name] TH, [name] 07/28/2023 1:45 PM Medical Record Number: [id number] Patient Account Number: [id number] Date of Birth/Sex: Treating RN: July 02, 1966 (57 y.o. [name] Primary Care Cassadee Vanzandt: Aram Beecham Other Clinician: Betha Loa Referring Rendell Thivierge: Treating Kionte Baumgardner/Extender: Hermine Messick Weeks in Treatment: 17 Wound Status Wound Number: 5 Primary Pressure Ulcer Etiology: Wound Location: Right, Midline Gluteus Wound Open Wounding Event: Gradually Appeared Status: Date Acquired: 10/04/2021 Comorbid Cataracts, Glaucoma, Coronary Artery Disease, Hypertension, Weeks Of Treatment: 17 History: History of pressure wounds, Osteoarthritis, Paraplegia Clustered Wound: No Photos [place], [place] ([id number]) [id number] Page 8 of 10 Wound Measurements Length: (cm) 5.5 Width: (cm) 9.2 Depth: (cm) 1.6 Area: (cm) 39.741 Volume: (cm) 63.586 % Reduction in Area: -304.8% % Reduction in Volume: -331.8% Epithelialization: None Undermining: Yes Starting Position (o'clock): 7 Ending Position (o'clock): 9 Maximum Distance: (cm) 2.1 Wound Description Classification: Category/Stage IV Exudate Amount: Large Exudate Type: Serosanguineous Exudate Color: red, brown Foul Odor After Cleansing: No Slough/Fibrino Yes Wound Bed Granulation Amount: Medium (34-66%) Exposed Structure Granulation Quality: Red, Pink Fascia Exposed: No Necrotic Amount: Medium  (34-66%) Fat Layer (Subcutaneous Tissue) Exposed: Yes Tendon Exposed: No Muscle Exposed: Yes Necrosis of Muscle: No Joint Exposed: No Bone Exposed: Yes Treatment Notes Wound #5 (Gluteus) Wound Laterality: Right, Midline Cleanser Byram Ancillary Kit - 15 Day Supply Discharge Instruction: Use supplies as instructed; Kit contains: (15) Saline Bullets; (15) 3x3 Gauze; 15 pr Gloves Peri-Wound Care Topical Primary Dressing Gauze Discharge Instruction: moistened with Dakins Solution Secondary Dressing Zetuvit Plus 4x4 (in/in) Secured With Medipore T - 30M Medipore H Soft Cloth Surgical T ape ape, 2x2 (in/yd) Compression Wrap Compression Stockings Add-Ons Electronic Signature(s) Signed: 07/28/2023 4:44:43 PM By: Angelina Pih Signed: 07/29/2023 10:28:30 AM By: Betha Loa Entered By: Betha Loa on 07/28/2023 11:06:06 [name] ([id number]) [id number] Page 9 of 10 -------------------------------------------------------------------------------- Wound Assessment Details Patient Name: Date of ServiceEulas [name] [facility], [name] 07/28/2023 1:45 PM Medical Record Number: [id number] Patient Account Number: [id number] Date of Birth/Sex: Treating RN: 1966/02/28 (57 y.o. [name] Primary Care Sumner Boesch: Aram Beecham Other Clinician: Betha Loa Referring Galo Sayed: Treating Olivianna Higley/Extender: Hermine Messick Weeks in Treatment: 17 Wound Status Wound Number: 7 Primary Pressure Ulcer Etiology: Wound Location: Left Gluteus Wound Open Wounding Event: Pressure Injury Status: Date Acquired: 07/28/2023 Comorbid Cataracts, Glaucoma, Coronary Artery Disease, Hypertension, Weeks Of Treatment: 0 History: History of pressure wounds, Osteoarthritis, Paraplegia Clustered Wound: No Photos Wound Measurements Length: (cm) 1.5 Width: (cm) 1 Depth: (cm) 0.1 Area: (cm) 1.178 Volume: (cm) 0.118 % Reduction in Area: % Reduction in  Volume: Epithelialization: None Tunneling: No Undermining: No Wound Description Classification: Category/Stage III Wound Margin: Distinct, outline attached Exudate Amount: Medium Exudate Type: Serosanguineous Exudate Color: red, brown Foul Odor After Cleansing: No Slough/Fibrino Yes Wound Bed Granulation Amount: Medium (34-66%) Exposed Structure Granulation Quality: Pink Fascia Exposed: No Necrotic Amount: Medium (34-66%) Fat Layer (Subcutaneous Tissue) Exposed: Yes Necrotic Quality: Adherent Slough Tendon Exposed: No Muscle Exposed: No Joint Exposed: No  Wound Cleansing / Measurement []  - 0 Simple Wound Cleansing - one wound X- 2 5 Complex Wound Cleansing - multiple wounds X- 1 5 Wound Imaging (photographs - any number of wounds) []  - 0 Wound Tracing (instead of photographs) []  - 0 Simple Wound Measurement - one wound X- 2 5 Complex Wound Measurement - multiple wounds INTERVENTIONS - Wound Dressings []  - 0 Small Wound Dressing one or multiple wounds X- 2 15 Medium Wound Dressing one or multiple wounds []  - 0 Large Wound Dressing one or multiple wounds []  - 0 Application of Medications - topical []  - 0 Application of Medications - injection INTERVENTIONS - Miscellaneous []  - 0 External ear exam CHESTER, COUPLAND ([id number]) [id number] Page 3 of 10 []  - 0 Specimen Collection (cultures, biopsies, blood, body fluids, etc.) []  - 0 Specimen(s) / Culture(s) sent or taken to Lab for analysis []  - 0 Patient Transfer (multiple staff / Michiel Sites Lift / Similar devices) []  - 0 Simple Staple / Suture removal (25 or less) []  - 0 Complex Staple / Suture removal (26 or more) []  - 0 Hypo / Hyperglycemic Management (close monitor of Blood Glucose) []  - 0 Ankle / Brachial Index (ABI) - do not check if billed separately X- 1 5 Vital Signs Has the patient been seen at the hospital within the last three years: Yes Total Score: 110 Level Of Care: New/Established - Level 3 Electronic Signature(s) Signed: 07/29/2023 10:28:30 AM By: Betha Loa Entered By: Betha Loa on 07/28/2023 11:36:36 -------------------------------------------------------------------------------- Encounter Discharge Information Details Patient Name: Date of Service: [name] TH, DO  NA LD 07/28/2023 1:45 PM Medical Record Number: [id number] Patient Account Number: [id number] Date of Birth/Sex: Treating RN: 1966-06-10 (57 y.o. [name] Primary Care Wynona Duhamel: Aram Beecham Other Clinician: Betha Loa Referring Tanvi Gatling: Treating Cardin Nitschke/Extender: Gabriel Earing in Treatment: 17 Encounter Discharge Information Items Discharge Condition: Stable Ambulatory Status: Wheelchair Discharge Destination: Home Transportation: Other Accompanied By: self Schedule Follow-up Appointment: Yes Clinical Summary of Care: Electronic Signature(s) Signed: 07/29/2023 10:28:30 AM By: Betha Loa Entered By: Betha Loa on 07/28/2023 11:51:42 Lower Extremity Assessment Details -------------------------------------------------------------------------------- [name] ([id number]) [id number] Page 4 of 10 Patient Name: Date of ServiceEulas [name] [facility], [name] 07/28/2023 1:45 PM Medical Record Number: [id number] Patient Account Number: [id number] Date of Birth/Sex: Treating RN: July 22, 1966 (57 y.o. [name] Primary Care Eileene Kisling: Aram Beecham Other Clinician: Betha Loa Referring Ender Rorke: Treating Keyuna Cuthrell/Extender: Hermine Messick Weeks in Treatment: 17 Electronic Signature(s) Signed: 07/28/2023 4:44:43 PM By: Angelina Pih Signed: 07/29/2023 10:28:30 AM By: Betha Loa Entered By: Betha Loa on 07/28/2023 11:06:32 -------------------------------------------------------------------------------- Multi Wound Chart Details Patient Name: Date of Service: [name] TH, [name] 07/28/2023 1:45 PM Medical Record Number: [id number] Patient Account Number: [id number] Date of Birth/Sex: Treating RN: 1966-04-11 (57 y.o. [name] Primary Care Barry Culverhouse: Aram Beecham Other Clinician: Betha Loa Referring [name]: Treating Vanna Sailer/Extender: Hermine Messick Weeks in Treatment: 17 Vital Signs Height(in): 59 Pulse(bpm): 103 Weight(lbs): 90 Blood Pressure(mmHg): 99/61 Body Mass Index(BMI): 18.2 Temperature(F): 98.1 Respiratory Rate(breaths/min): 18 [5:Photos:] [N/A:N/A] Right, Midline Gluteus Left Gluteus N/A Wound Location: Gradually Appeared Pressure Injury N/A Wounding Event: Pressure Ulcer Pressure Ulcer N/A Primary Etiology: Cataracts, Glaucoma, Coronary Artery Cataracts, Glaucoma, Coronary Artery N/A Comorbid History: Disease, Hypertension, History of Disease, Hypertension, History of pressure wounds, Osteoarthritis, pressure wounds, Osteoarthritis, Paraplegia Paraplegia 10/04/2021 07/28/2023 N/A Date Acquired: 17 0 N/A Weeks of Treatment: Open Open N/A Wound Status: No No N/A Wound

## 2023-08-04 ENCOUNTER — Encounter: Payer: 59 | Admitting: Physician Assistant

## 2023-08-04 DIAGNOSIS — M8668 Other chronic osteomyelitis, other site: Secondary | ICD-10-CM | POA: Diagnosis not present

## 2023-08-04 NOTE — Progress Notes (Addendum)
[place], [name] ([id number]) [id number] Page 1 of 12 Visit Report for 08/04/2023 Chief Complaint Document Details Patient Name: Date of Service: [name], DO [place] LD 08/04/2023 1:45 PM Medical Record Number: [id number] Patient Account Number: [id number] Date of Birth/Sex: Treating RN: 05/06/1966 (57 y.o. [name] Primary Care Provider: Aram Beecham Other Clinician: Betha Loa Referring Provider: Treating Provider/Extender: Gabriel Earing in Treatment: 18 Information Obtained from: Patient Chief Complaint Right ischial tuberosity pressure ulcer with chronic osteomyelitis and left gluteal ulcer Electronic Signature(s) Signed: 08/04/2023 2:07:34 PM By: Allen Derry PA-C Entered By: Allen Derry on 08/04/2023 14:07:34 -------------------------------------------------------------------------------- HPI Details Patient Name: Date of Service: [name] TH, [name] 08/04/2023 1:45 PM Medical Record Number: [id number] Patient Account Number: [id number] Date of Birth/Sex: Treating RN: [birth year] (57 y.o. [name] Primary Care Provider: Aram Beecham Other Clinician: Betha Loa Referring Provider: Treating Provider/Extender: Gabriel Earing in Treatment: 18 History of Present Illness HPI Description: 57 year old male with a history of spina bifida presenting to [place] with a history of a open wound on the left gluteal region near his upper thigh which she's had for several weeks. He was seen in the ED recently at [facility] and was put on doxycycline and was advised some local care. his past medical history is significant for spinal bifid, neurogenic bladder, kidney stones, sacral pressure sores, constipation. Past surgical history significant for partial cystectomy, ileal conduit, VP shunt removal, percutaneous nephro lithotripsy. In the remote past the patient says he's had some  treatment for a ulcer on this area and was treated with a skin graft. Readmission: 10/16/2021 upon evaluation today patient appears to be doing somewhat poorly in regard to a fairly large wound noted over the right ischial tuberosity location. This is a stage IV pressure ulcer. Of note this is also positive for osteomyelitis upon a CT scan which was performed during the time that he was admitted in the hospital on 09/19/2021. With that being said it is noted in the right inferior buttock that he has a decubitus ulcer which extends to the posterior toe inferior right ischium where there is evidence of osteomyelitis. When he was here in the office actually missed that this was positive I missed read it and thought it said that there was no osteomyelitis. That is not the case there is in fact osteomyelitis noted here. I actually did review his notes as well in epic. Looking to the discharge summary it appears that the patient was admitted from 09/19/2021 through 09/23/2021. He was discharged home with home health care according to the note. With that being said from what I understand his sister actually takes care of him at this point. He was also to follow-up with a primary care provider and here at the wound care center within a week. I am just now seeing him on the 13th almost a ETHANIEL, GARFIELD ([id number]) [id number] Page 2 of 12 month after discharge. He does have a history of again osteomyelitis of this right hip/ischial tuberosity location. 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 The reason for his admission was actually worsening of the decubitus ulcer upon admission. He does have chronic osteomyelitis of this area. He was treated with empiric antibiotics he had acute kidney injury which improved with IV fluids he also  had hypokalemia. Surgical debridement was performed by general surgery at that time and ID was consulted to assist with management according to the notes. IV antibiotics were recommended but patient declined and wanted oral antibiotics at discharge. Therefore no IV antibiotics were planned for discharge time. This case was under the care of Dr. Joylene Draft while he was in the hospital when she did discharge him with a 2-week course of doxycycline and Augmentin according to the notes. It looks like Dr. Judithann Sheen office did call and leave a message for the patient to get scheduled for a follow-up visit after hospital discharge I do not see where he showed up in fact it appears that the patient has a no-show letter from Dr. Judithann Sheen office noted in epic due to a appointment that was missed on 10/06/2021. Looking at the pictures from Dr. Cristine Polio notes on 09/23/2021 it does appear that the wound is a little bit better as far as the cleanliness of the surface of the wound today but nonetheless still is a very significant wound. 10/26/2021 upon evaluation today patient appears to be doing okay in regard to his wound. Again this is a wound that he did indeed have osteomyelitis and previous. With that being said I do not see any signs of a worsening infection at this time which is good news. Overall I think that we are headed in the right direction although still I think he needs more aggressive offloading. 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 I do believe this would be ideal for him to be honest. Readmission: 01-15-2022 upon evaluation today patient presents for reevaluation he was last seen on October 26, 2021. At that time unfortunately he was having issues with a significant ischial pressure ulcer on the right ischial tuberosity. Subsequently he ended up having decent response to the Dakin's moistened gauze dressing that was previously being utilized and subsequently the patient's  sister who is his primary caregiver as well states that she decided just to try to manage this at home. Nonetheless unfortunately this is getting a little larger not better. He does spend a lot of the day in his chair. He does have an air mattress according what they tell me today. That was previously ordered. With that being said I do believe that at this time things do appear to be doing much better from the standpoint of infection I do not see any signs of overt infection. There is also no evidence of systemic infection. No fevers, chills, nausea, vomiting, or diarrhea. With that being said I do believe based on what I am seeing the wound is a bit cleaner and he possibly could be an excellent candidate for a wound VAC. 02-05-2022 upon evaluation today patient appears to be doing about the same in regard to his wound. Unfortunately he is not any better but he also really has not had the wound VAC on sufficiently. There is been some confusion with the orders part of that I think is our follow-up with the way the order was written part of it may be with how the wound VAC is being placed either way we need to see what we can do to try to improve things in this regard. We will clarify the orders today going forward for home health. Unfortunately the patient does have a new wound today which is on the opposite side. This is not good 1 was bad enough have another area to open is definitely not a  good thing. Its not as deep but nonetheless is also not very shallow either. We may potentially be able to get this to the point that we could bridge the 2 together in order to wound VAC both but right now want a focus on to try to get the wound VAC going for the main wound initially we will likely use Hydrofera Blue on the new wound.Marland Kitchen 02-19-2022 upon evaluation today patient's wound appears to be doing a little bit worse in the way of maceration fortunately there is no signs of active infection locally or  systemically which is great news. No fevers, chills, nausea, vomiting, or diarrhea. 03-05-2022 upon evaluation today patient unfortunately does not appear to be doing nearly as good in regard to his wounds as I would like to see. Fortunately I do not see any evidence of active infection that is obvious although I am can obtain a wound culture to ensure that there is not anything getting worse here as far as the wound without the wound VAC on his concern. With that being said I will go ahead and get this sent in and evaluated will initiate treatment as needed going forward if necessary. With regard to the wound with the wound VAC this continues to be placed directly over the wound and there is obvious signs of deep tissue injury. The suction being here means that he is sitting on it I think this may be part of the reason why it is coming off although not 100% certain. 03-12-2022 upon evaluation today patient still has not gotten the antibiotics that were called in for him 4 days ago. He tells me that his sister just told him this morning that they were ready but that she has not had a chance to go pick them up. Nonetheless I feel like that this is really something he needs any should have been taken that not the other antibiotics which were not doing the job for him to be honest. The patient states and I completely agree that there is really no way he would have known relying on his sister to let him know obviously. Nonetheless I do believe that he needs to not be taking any other antibiotics and be on the Levaquin as soon as possible. He tells me that she said she was going to pick them up today. In regard to home health I am hopeful that they actually have been bridging this now. I do think that the wound looks better and this is good news. With that being said I am not certain that I can really tell for sure how this was attached it was removed before he came in and the black foam left in place. I really  want him to leave the wound VAC in place until he comes in so that we can monitor and make sure that this is being done correctly he voiced understanding. 03-19-2022 upon evaluation today and much happier with how things appear as far as the patient's wounds are concerned. I think he is on a much better track and they are doing a better job at bridging although its not quite where I like to see it is still more posterior which I think the patient needs to be a little bit more lateral on his side. Either way this is something that I did marked with a small dot today for him to tell the home health nurses well so she can bridge it to that location also think the tubing should  go up instead of going down to just make it less cumbersome overall for him. 04-19-2022 upon evaluation today patient appears to be doing a little better in regard to his wounds. Fortunately I do not see any signs of infection I do believe he is on the right track. The wound VAC does seem to be helping to clean the wound up a bit and bring it in from the base at work. This is good news. 05-10-2022 upon evaluation today patient appears to be doing well currently in regard to his wounds in general in fact of the smaller of the 2 wounds without the wound VAC actually appears to be doing so much better this is almost completely closed and very pleased in that regard. He has been staying off of this he tells me and shows. With that being said the wound with the wound VAC is going require some sharp debridement there is some tissue which is not quite as healthy and we are going to go ahead and continue that for probably 2 more weeks although I think we may be closer to discontinuing this as well since it has healed and quite significantly. 05-24-2022 upon evaluation today patient appears to be doing well with regard to his wound in fact this looks better than I have been expected. This actually I think is a good time to go ahead and switch  away from the wound VAC based on what I am seeing. I think we go to [facility] with a bordered foam dressing. 06-21-2022 upon evaluation today patient appears to be doing well currently in regard to his wound this is measuring smaller and looks well. Fortunately there does not appear to be any signs of active infection locally or systemically at this time which is great news. No fevers, chills, nausea, vomiting, or diarrhea. 07-29-2022 upon evaluation today patient appears to be doing well currently in regard to his wound. This is actually measuring smaller although there is a lot of hypergranulation noted. Fortunately there does not appear to be any signs of active infection locally or systemically at this time. No fevers, chills, nausea, vomiting, or diarrhea. 11/9; pressure ulcer on the right buttock in the setting of spina bifida and lower extremity paralysis. He has been using [facility] 09-02-2022 upon evaluation today patient appears to be doing well currently in regard to his wound. We have been using Hydrofera Blue this is showing signs of improvement as far as getting smaller is concerned. There is some drainage around the edges of the wound but I think a lot of this is due to the fact he did not even have a dressing on that he came in today. 12/29; right buttock pressure area which was initially stage IV. This is in the setting of a patient with spina bifida spends most of his time in a wheelchair. I am not certain how much he is offloading this. He has been using for [facility] as a primary dressing. When he was here the last visit he was felt to have hyper granulated tissue and was treated with silver nitrate chemical cauterization 10-14-2022 upon evaluation today patient appears to be doing poorly in regard to the wound in the right ischial location. Unfortunately he tells me that the [place], [place] ([id number]) [id number] Page 3 of 12 dressing  seems to be falling off frequently. Unfortunately I think that this has contributed to him developing an infection which appears to be exemplified by the fact that he has bone  exposed which is actually necrotic and working its way out of the wound up and have to perform a fairly significant debridement today it does appear. I think that he needs to have these dressings ensure that they are in place at all times. Also discussed with the patient that we will get a need to do some testing in order to see if he indeed has an infection. If he does have a bone infection which I think is pretty likely to be osteomyelitis of this ischial location. I discussed that with the patient in detail today and depending on what we see him and make any adjustments in care as far as going forward is concerned. Obviously depressed antibiotic that we gave him that he tells me was somewhat irritating as far as the stomach side effects are concerned. He states he really would not want to be on that again. Either way I feel he is probably going to be placed on something once we get the results of the culture back. I am honestly concerned about the fact that he comes into the clinic without any dressings in place I am not sure how often they are really staying where they need to be as far as this wound is concerned. I actually cannot remember seeing him anytime in the past 2 months or so at least that he came in with a dressing intact. 10-21-2022 upon evaluation today patient presents for initial inspection here in our clinic concerning issues that he has been having with a wound in the sacral area. Again last time he was here I did debride away necrotic bone which was confirmed on pathology although it was not necessarily confirmed that this was secondary to osteomyelitis on the pathology report. Also the x-ray did not neither confirm nor deny the presence of osteomyelitis and his culture unfortunately showed multiple organisms  with nothing predominance of this does not help to rule and tailor down exactly what is going on here. Fortunately I do not see any signs of active infection systemically which is great news. With that being said locally I am definitely seeing some issues here. In fact this is warm to touch around the sacral area and I think that cellulitis is definitely present I just cannot pinpoint the exact organism. With that being said I did discuss with the patient as well today that I do believe that he probably needs an MRI in order to further quantify the extent of her likely osteomyelitis and this again is something that we are going to order this will need to be without contrast due to the fact that he is allergic to contrast dye. 11-18-2022 upon evaluation today patient's wound is actually showing some signs of improvement currently. We have been doing the Dakin's moistened gauze dressing which I think is helping to clean up the wound for seeing some granulation growing and still the tissue is not quite as healthy as I would love to see but better than previous. He does have osteomyelitis but the good news is he seems to be treated well with the antibiotics he does have a refill that was just picked up and I think that he needs to continue to take that for certain. 3/7; patient is on a 6-week clinic hiatus. He lives at home with his sister however she is not at home all the time. He transfers out of bed and a sliding board. He claims he is only in the in the wheelchair about 4 5 hours.  He does have underlying osteomyelitis I am not sure about the status of the antibiotics here. Part of the wound still easily probes to bone 12-23-2022 upon evaluation today patient appears to be doing well currently in regard to his wound all things considered there is still a lot of hypergranulation but I do feel like that with the Vashe moistened gauze is actually doing better than he was previously with the Hydrofera Blue.  This just did not seem to be staying in place and with the Vashe through able to change this more frequently. Readmission: 03-31-2023 this is a gentleman whom I have seen multiple times intermittently throughout the years compliance has been a real issue however. I do feel like there is some significant issues here still with the fact that dressing changes and keeping dressings in place are still pretty much as a significant problem here for [place]. I do not see any signs of active infection at this time which is great news but at the same time the wounds do not worse in fact he has another wound different than what I even noted previous. Subsequently he I do believe that the patient should continue to do monitor for any evidence of infection or worsening. Overall I think he needs to have regular follow-up visits with [place] and I think that he needs to have dressings in place at all times. He tells me that he is at home primarily by himself transferring himself from the first thing in the morning through at least 3 in the afternoon as his sister works he really needs somebody gets a caregiver with him throughout the day. 04-12-2023 upon evaluation today patient appears to be doing a little worse currently in regard to his wounds. He has been tolerating the dressing changes without complication. Fortunately I do not see any evidence of active infection locally nor systemically at this point. 04-21-2023 upon evaluation today patient's wound unfortunately appears to have purulent drainage and also there is a malodorous drainage at this point. Unfortunately I think that there is something going on here that does not seem to be doing nearly as good as what we would like to see. I think that he is likely going to require some antibiotics. He has taken Cipro before without complication. 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 On 7/18 he had purulent drainage he had a PCR culture  and is now on Cipro he is tolerating this well. Using Hydrofera Blue's a 2 bit and border foam his sister is changing the dressing After his sister goes to work the patient is apparently in a wheelchair all day. There is no way to heal the wound like this with that degree of prolonged pressure. 05-12-2023 upon evaluation today patient unfortunately appears to be doing much worse than what we previously seen. Last time he was here he was seen by Dr. Leanord Hawking. This was shortly after I put him on Cipro he was tolerating that well. Nonetheless he did not show up last week apparently there was a transportation issue. This week upon arrival this wound appears to be doing significantly worse there is a significant malodorous drainage and this is deeper he has erythema in the gluteal region around it appears to be infected and to be honest despite the Cipro and oral antibiotics he seems to be doing worse not better. 05-19-2023 upon evaluation patient appears to be doing better than last time I saw him with  regard to his wound in the right gluteal location. Fortunately there does not appear to be any signs of systemic infection though he does have evidence of osteomyelitis noted which were acute on chronic and that was the findings that were noted in the hospital when he was admitted most recently after I sent him from May 12, 2023 through May 17, 2023. The patient was treated with IV Zosyn and bank while in the hospital and discharged on Augmentin although that has not been picked up yet it has been 2 days since he was discharged he states that "the pharmacy never called his sister to let her know it was ready". Nonetheless this needs to be gotten ASAP to make sure that he is fully treated as far as the infection is concerned I discussed that with him today as well. 05-26-2023 upon evaluation today patient appears to be doing better today with regard to his wounds compared to what we have noticed previous.  Fortunately I do not see any evidence of active infection locally or systemically which is great news and in general I do believe that we are making headway towards complete closure. With that being said we still had some trouble getting the order done for the trapeze and aids to help him with regard to his daily activities and preventing the dressings from coming off. We have reached out to his CAP Worker and have not gotten in touch with her as of yet. I did recommend that the patient try to see. He or his sister to get in touch with Angelique and see if they can help [place] in that regard. 8/29; patient with 2 wounds in close juxtaposition on the right buttock. Both of these probes very close to the bone especially the area over the ischial tuberosity. Patient was recently in the hospital for additional antibiotics I did not look at this today. We are using Dakin's wet to dry. His sister changes in dressings. The wounds are measuring larger. 06-16-2023 upon evaluation today patient appears to be doing worse currently at this time. Fortunately I do not see any signs of infection which is good news but at the same time he does have deep tissue injury which has me concerned about potential further breakdown here. He does tell me that he has been up on it a little bit more. 07-12-2023 upon evaluation today patient appears to be doing okay in regard to his wound there is actually an area that is merged between the 2 and therefore this is really just 1 wound we converted it as such. There is no separation between the 2. I knew that this was going to happen it was just a matter of time. With that being said I do not see any signs of infection obvious at this point but I do believe that this is still an issue with him keeping the dressing in place he tells me it is not staying in place which does have me concerned as well. Fortunately I do not see any evidence of worsening overall systemically though locally  I am a little concerned about how the wounds have digressed since I last saw him. 07-28-2023 upon evaluation today patient unfortunately appears to be doing worse with a new wound at this point on the left side that was not there last time I saw him. He has had missed visits in the past 4 visits he has missed 2 of those unfortunately. This again was a condition about seeing him back  that he had to make sure that he showed up for all visits. That is not happening at the moment and I have discussion with him today about that. I discussed that if he is not going to show up for his visits that we are going to have to discharge him that was the original agreement and that is still where things stand at this point to Elsa, [name] ([id number]) [id number] Page 4 of 12 be honest. The patient voiced understanding. I told him that we are going to give him 1 more shot today before we would proceed to anything like that but again if he is not coming to the appointments as directed that there is really no point and has continued to have them on the books as far as trying to get this healed is concerned. He voiced understanding. He still tells me that his dressings were also not staying in place in fact he tells me that every time he gets out of the bed the dressing is already off. He came in today with no dressing on. Noncompliance has been a significant issue through the course of the treatment with this gentleman and still continues to be so. 08-04-2023 upon evaluation today patient's wound actually appears to be doing better than it has for quite some time. I am actually seeing some improvement which is good news and in general I do believe that we will make an some headway here. I do not see any signs of obvious infection there is also no need for obvious debridement today. Overall I think that we are on the right track. He did actually have a dressing in place upon arrival  today. Electronic Signature(s) Signed: 08/04/2023 2:42:01 PM By: Allen Derry PA-C Entered By: Allen Derry on 08/04/2023 14:42:01 -------------------------------------------------------------------------------- Physical Exam Details Patient Name: Date of ServiceEulas [name] [facility], [name] 08/04/2023 1:45 PM Medical Record Number: [id number] Patient Account Number: [id number] Date of Birth/Sex: Treating RN: November 06, 1965 (57 y.o. [name] Primary Care Provider: Aram Beecham Other Clinician: Betha Loa Referring Provider: Treating Provider/Extender: Hermine Messick Weeks in Treatment: 18 Constitutional Well-nourished and well-hydrated in no acute distress. Respiratory normal breathing without difficulty. Psychiatric this patient is able to make decisions and demonstrates good insight into disease process. Alert and Oriented x 3. pleasant and cooperative. Notes Upon evaluation patient's wound currently actually appears to be doing well this is actually one of the best visits have had with him as far as how the wound appears for quite some time. I actually feel like that he is kept the dressing in place is sister seems to be doing a good job as far as that is concerned and I am very happy in that regard. Electronic Signature(s) Signed: 08/04/2023 2:42:39 PM By: Allen Derry PA-C Entered By: Allen Derry on 08/04/2023 14:42:39 -------------------------------------------------------------------------------- Physician Orders Details Patient Name: Date of Service: [name] [facility], [name] 08/04/2023 1:45 PM Medical Record Number: [id number] Patient Account Number: [id number] Date of Birth/Sex: Treating RN: November 08, 1965 (57 y.o. [name] Primary Care Provider: Aram Beecham Other Clinician: Betha Loa Referring Provider: Treating Provider/Extender: Hermine Messick [place], [place] ([id number]) [id number] Page 5 of  12 Weeks in Treatment: 18 The following information was scribed by: Betha Loa The information was scribed for: Allen Derry Verbal / Phone Orders: No Diagnosis Coding ICD-10 Coding Code Description M86.68 Other chronic osteomyelitis, other site L89.314 Pressure ulcer of right buttock, stage 4 L89.323 Pressure ulcer of left buttock, stage  3 Q76.0 Spina bifida occulta Z93.3 Colostomy status I10 Essential (primary) hypertension I25.10 Atherosclerotic heart disease of native coronary artery without angina pectoris Follow-up Appointments Return Appointment in 1 week. Bathing/ [employer] wounds with antibacterial soap and water. Anesthetic (Use 'Patient Medications' Section for Anesthetic Order Entry) Lidocaine applied to wound bed Off-Loading Gel wheelchair cushion Hospital bed/mattress - ETHOS Low air-loss mattress (Group 2) - HOSPITAL BED WITH GROUPE 2 AIR MATTRESS AND TRAPEZE BAR ETHOS Turn and reposition every 2 hours Wound Treatment Wound #5 - Gluteus Wound Laterality: Right, Midline Cleanser: Byram Ancillary Kit - 15 Day Supply (Generic) 3 x Per Week/30 Days Discharge Instructions: Use supplies as instructed; Kit contains: (15) Saline Bullets; (15) 3x3 Gauze; 15 pr Gloves Prim Dressing: Gauze 3 x Per Week/30 Days ary Discharge Instructions: moistened with Dakins Solution Secondary Dressing: Zetuvit Plus 4x4 (in/in) 3 x Per Week/30 Days Secured With: Medipore T - 54M Medipore H Soft Cloth Surgical T ape ape, 2x2 (in/yd) 3 x Per Week/30 Days Wound #7 - Gluteus Wound Laterality: Left Cleanser: Byram Ancillary Kit - 15 Day Supply (Generic) 3 x Per Week/30 Days Discharge Instructions: Use supplies as instructed; Kit contains: (15) Saline Bullets; (15) 3x3 Gauze; 15 pr Gloves Prim Dressing: Silvercel Small 2x2 (in/in) 3 x Per Week/30 Days ary Discharge Instructions: Apply Silvercel Small 2x2 (in/in) as instructed Secured With: Tegaderm Film Transparent 4x4.75  (in/in) 3 x Per Week/30 Days Discharge Instructions: Apply to wound bed Electronic Signature(s) Signed: 08/04/2023 5:26:58 PM By: Allen Derry PA-C Signed: 08/05/2023 12:43:51 PM By: Betha Loa Entered By: Betha Loa on 08/04/2023 14:30:05 [name] ([id number]) [id number] Page 6 of 12 Prescription 08/04/2023 -------------------------------------------------------------------------------- [name] PA-C Patient Name: Provider: Sep 03, 1966 [id number] Date of Birth: NPI#: [name] [id number] Sex: DEA #: [phone] Phone #: License #: UPN: Patient Address: [address] [facility] [address] [facility] [place], [place] [address] [address] [place], [place] [address] [phone] Allergies Iodinated Contrast Media; Sulfa (Sulfonamide Antibiotics); latex; morphine Provider's Orders Hospital bed/mattress - ETHOS Hand Signature: Date(s): Prescription 08/04/2023 [name] PA-C Patient Name: Provider: 08/15/1966 [id number] Date of Birth: NPI#: [name] [id number] Sex: DEA #: [id number] [phone] Phone #: License #: UPN: Patient Address: [address] [facility] [address] [facility] [place], [place] [address] [address] [place], [place] [address] [phone] Allergies Iodinated Contrast Media; Sulfa (Sulfonamide Antibiotics); latex; morphine Provider's Orders Low air-loss mattress (Group 2) - HOSPITAL BED WITH GROUPE 2 AIR MATTRESS AND TRAPEZE BAR ETHOS Hand Signature: Date(s): Electronic Signature(s) Signed: 08/04/2023 5:26:58 PM By: Allen Derry PA-C Signed: 08/05/2023 12:43:51 PM By: Betha Loa Entered By: Betha Loa on 08/04/2023 14:30:06 [name] ([id number]) [id number] Page 7 of  12 -------------------------------------------------------------------------------- Problem List Details Patient Name: Date of ServiceEulas [name] [facility], [name] 08/04/2023 1:45 PM Medical Record Number: [id number] Patient Account Number: [id number] Date of Birth/Sex: Treating RN: [birth year] (57 y.o. [name] Primary Care Provider: Aram Beecham Other Clinician: Betha Loa Referring Provider: Treating Provider/Extender: Hermine Messick Weeks in Treatment: 18 Active Problems ICD-10 Encounter Code Description Active Date MDM Diagnosis M86.68 Other chronic osteomyelitis, other site 05/19/2023 No Yes L89.314 Pressure ulcer of right buttock, stage 4 03/31/2023 No Yes L89.323 Pressure ulcer of left buttock, stage 3 07/28/2023 No Yes Q76.0 Spina bifida occulta 03/31/2023 No Yes Z93.3 Colostomy status 03/31/2023  No Yes I10 Essential (primary) hypertension 03/31/2023 No Yes I25.10 Atherosclerotic heart disease of native coronary artery without angina pectoris 03/31/2023 No Yes Inactive Problems Resolved Problems Electronic Signature(s) Signed: 08/04/2023 2:07:30 PM By: Allen Derry PA-C Entered By: Allen Derry on 08/04/2023 14:07:30 [name] ([id number]) [id number] Page 8 of 12 -------------------------------------------------------------------------------- Progress Note Details Patient Name: Date of ServiceEulas [name] [facility], [name] 08/04/2023 1:45 PM Medical Record Number: [id number] Patient Account Number: [id number] Date of Birth/Sex: Treating RN: 18-Sep-1966 (57 y.o. [name] Primary Care Provider: Aram Beecham Other Clinician: Betha Loa Referring Provider: Treating Provider/Extender: Gabriel Earing in Treatment: 18 Subjective Chief Complaint Information obtained from Patient Right ischial tuberosity pressure ulcer with chronic osteomyelitis and left gluteal ulcer History of Present  Illness (HPI) 57 year old male with a history of spina bifida presenting to [place] with a history of a open wound on the left gluteal region near his upper thigh which she's had for several weeks. He was seen in the ED recently at [facility] and was put on doxycycline and was advised some local care. his past medical history is significant for spinal bifid, neurogenic bladder, kidney stones, sacral pressure sores, constipation. Past surgical history significant for partial cystectomy, ileal conduit, VP shunt removal, percutaneous nephro lithotripsy. In the remote past the patient says he's had some treatment for a ulcer on this area and was treated with a skin graft. Readmission: 10/16/2021 upon evaluation today patient appears to be doing somewhat poorly in regard to a fairly large wound noted over the right ischial tuberosity location. This is a stage IV pressure ulcer. Of note this is also positive for osteomyelitis upon a CT scan which was performed during the time that he was admitted in the hospital on 09/19/2021. With that being said it is noted in the right inferior buttock that he has a decubitus ulcer which extends to the posterior toe inferior right ischium where there is evidence of osteomyelitis. When he was here in the office actually missed that this was positive I missed read it and thought it said that there was no osteomyelitis. That is not the case there is in fact osteomyelitis noted here. I actually did review his notes as well in epic. Looking to the discharge summary it appears that the patient was admitted from 09/19/2021 through 09/23/2021. He was discharged home with home health care according to the note. With that being said from what I understand his sister actually takes care of him at this point. He was also to follow-up with a primary care provider and here at the wound care center within a week. I am just now seeing him on the 13th almost a month after discharge.  He does have a history of again osteomyelitis of this right hip/ischial tuberosity location. 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 The reason for his admission was actually worsening of the decubitus ulcer upon admission. He does have chronic osteomyelitis of this area. He was treated with empiric antibiotics he had acute kidney injury which improved with IV fluids he also had hypokalemia. Surgical debridement was performed by general surgery at that time and ID was consulted to assist with management according to the notes. IV antibiotics were recommended but patient declined and wanted oral antibiotics at discharge. Therefore no IV antibiotics were  planned for discharge time. This case was under the care of Dr. Joylene Draft while he was in the hospital when she did discharge him with a 2-week course of doxycycline and Augmentin according to the notes. It looks like Dr. Judithann Sheen office did call and leave a message for the patient to get scheduled for a follow-up visit after hospital discharge I do not see where he showed up in fact it appears that the patient has a no-show letter from Dr. Judithann Sheen office noted in epic due to a appointment that was missed on 10/06/2021. Looking at the pictures from Dr. Cristine Polio notes on 09/23/2021 it does appear that the wound is a little bit better as far as the cleanliness of the surface of the wound today but nonetheless still is a very significant wound. 10/26/2021 upon evaluation today patient appears to be doing okay in regard to his wound. Again this is a wound that he did indeed have osteomyelitis and previous. With that being said I do not see any signs of a worsening infection at this time which is good news. Overall I think that we are headed in the right direction although still I think he needs more aggressive  offloading. 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 I do believe this would be ideal for him to be honest. Readmission: 01-15-2022 upon evaluation today patient presents for reevaluation he was last seen on October 26, 2021. At that time unfortunately he was having issues with a significant ischial pressure ulcer on the right ischial tuberosity. Subsequently he ended up having decent response to the Dakin's moistened gauze dressing that was previously being utilized and subsequently the patient's sister who is his primary caregiver as well states that she decided just to try to manage this at home. Nonetheless unfortunately this is getting a little larger not better. He does spend a lot of the day in his chair. He does have an air mattress according what they tell me today. That was previously ordered. With that being said I do believe that at this time things do appear to be doing much better from the standpoint of infection I do not see any signs of overt infection. There is also no evidence of systemic infection. No fevers, chills, nausea, vomiting, or diarrhea. With that being said I do believe based on what I am seeing the wound is a bit cleaner and he possibly could be an excellent candidate for a wound VAC. 02-05-2022 upon evaluation today patient appears to be doing about the same in regard to his wound. Unfortunately he is not any better but he also really has not had the wound VAC on sufficiently. There is been some confusion with the orders part of that I think is our follow-up with the way the order was written part of it may be with how the wound VAC is being placed either way we need to see what we can do to try to improve things in this regard. We will clarify the orders today going forward for home health. Unfortunately the patient does have a new wound today which is on the opposite side. This is not good 1 was bad enough have another area to open  is definitely not a good thing. Its not as deep but nonetheless is also not very shallow either. We may potentially be able to get this to the point that we could bridge the 2 together in order to wound VAC both but right now  want a focus on to try to get the wound VAC going for the main wound initially we will likely use Hydrofera Blue on the new wound.Marland Kitchen 02-19-2022 upon evaluation today patient's wound appears to be doing a little bit worse in the way of maceration fortunately there is no signs of active infection locally or systemically which is great news. No fevers, chills, nausea, vomiting, or diarrhea. [facility], [name] ([id number]) [id number] Page 9 of 12 03-05-2022 upon evaluation today patient unfortunately does not appear to be doing nearly as good in regard to his wounds as I would like to see. Fortunately I do not see any evidence of active infection that is obvious although I am can obtain a wound culture to ensure that there is not anything getting worse here as far as the wound without the wound VAC on his concern. With that being said I will go ahead and get this sent in and evaluated will initiate treatment as needed going forward if necessary. With regard to the wound with the wound VAC this continues to be placed directly over the wound and there is obvious signs of deep tissue injury. The suction being here means that he is sitting on it I think this may be part of the reason why it is coming off although not 100% certain. 03-12-2022 upon evaluation today patient still has not gotten the antibiotics that were called in for him 4 days ago. He tells me that his sister just told him this morning that they were ready but that she has not had a chance to go pick them up. Nonetheless I feel like that this is really something he needs any should have been taken that not the other antibiotics which were not doing the job for him to be honest. The patient states and I  completely agree that there is really no way he would have known relying on his sister to let him know obviously. Nonetheless I do believe that he needs to not be taking any other antibiotics and be on the Levaquin as soon as possible. He tells me that she said she was going to pick them up today. In regard to home health I am hopeful that they actually have been bridging this now. I do think that the wound looks better and this is good news. With that being said I am not certain that I can really tell for sure how this was attached it was removed before he came in and the black foam left in place. I really want him to leave the wound VAC in place until he comes in so that we can monitor and make sure that this is being done correctly he voiced understanding. 03-19-2022 upon evaluation today and much happier with how things appear as far as the patient's wounds are concerned. I think he is on a much better track and they are doing a better job at bridging although its not quite where I like to see it is still more posterior which I think the patient needs to be a little bit more lateral on his side. Either way this is something that I did marked with a small dot today for him to tell the home health nurses well so she can bridge it to that location also think the tubing should go up instead of going down to just make it less cumbersome overall for him. 04-19-2022 upon evaluation today patient appears to be doing a little better in regard to his wounds. Fortunately I  do not see any signs of infection I do believe he is on the right track. The wound VAC does seem to be helping to clean the wound up a bit and bring it in from the base at work. This is good news. 05-10-2022 upon evaluation today patient appears to be doing well currently in regard to his wounds in general in fact of the smaller of the 2 wounds without the wound VAC actually appears to be doing so much better this is almost completely closed and  very pleased in that regard. He has been staying off of this he tells me and shows. With that being said the wound with the wound VAC is going require some sharp debridement there is some tissue which is not quite as healthy and we are going to go ahead and continue that for probably 2 more weeks although I think we may be closer to discontinuing this as well since it has healed and quite significantly. 05-24-2022 upon evaluation today patient appears to be doing well with regard to his wound in fact this looks better than I have been expected. This actually I think is a good time to go ahead and switch away from the wound VAC based on what I am seeing. I think we go to [facility] with a bordered foam dressing. 06-21-2022 upon evaluation today patient appears to be doing well currently in regard to his wound this is measuring smaller and looks well. Fortunately there does not appear to be any signs of active infection locally or systemically at this time which is great news. No fevers, chills, nausea, vomiting, or diarrhea. 07-29-2022 upon evaluation today patient appears to be doing well currently in regard to his wound. This is actually measuring smaller although there is a lot of hypergranulation noted. Fortunately there does not appear to be any signs of active infection locally or systemically at this time. No fevers, chills, nausea, vomiting, or diarrhea. 11/9; pressure ulcer on the right buttock in the setting of spina bifida and lower extremity paralysis. He has been using [facility] 09-02-2022 upon evaluation today patient appears to be doing well currently in regard to his wound. We have been using Hydrofera Blue this is showing signs of improvement as far as getting smaller is concerned. There is some drainage around the edges of the wound but I think a lot of this is due to the fact he did not even have a dressing on that he came in today. 12/29; right buttock pressure area which  was initially stage IV. This is in the setting of a patient with spina bifida spends most of his time in a wheelchair. I am not certain how much he is offloading this. He has been using for [facility] as a primary dressing. When he was here the last visit he was felt to have hyper granulated tissue and was treated with silver nitrate chemical cauterization 10-14-2022 upon evaluation today patient appears to be doing poorly in regard to the wound in the right ischial location. Unfortunately he tells me that the dressing seems to be falling off frequently. Unfortunately I think that this has contributed to him developing an infection which appears to be exemplified by the fact that he has bone exposed which is actually necrotic and working its way out of the wound up and have to perform a fairly significant debridement today it does appear. I think that he needs to have these dressings ensure that they are in place at  all times. Also discussed with the patient that we will get a need to do some testing in order to see if he indeed has an infection. If he does have a bone infection which I think is pretty likely to be osteomyelitis of this ischial location. I discussed that with the patient in detail today and depending on what we see him and make any adjustments in care as far as going forward is concerned. Obviously depressed antibiotic that we gave him that he tells me was somewhat irritating as far as the stomach side effects are concerned. He states he really would not want to be on that again. Either way I feel he is probably going to be placed on something once we get the results of the culture back. I am honestly concerned about the fact that he comes into the clinic without any dressings in place I am not sure how often they are really staying where they need to be as far as this wound is concerned. I actually cannot remember seeing him anytime in the past 2 months or so at least that he came  in with a dressing intact. 10-21-2022 upon evaluation today patient presents for initial inspection here in our clinic concerning issues that he has been having with a wound in the sacral area. Again last time he was here I did debride away necrotic bone which was confirmed on pathology although it was not necessarily confirmed that this was secondary to osteomyelitis on the pathology report. Also the x-ray did not neither confirm nor deny the presence of osteomyelitis and his culture unfortunately showed multiple organisms with nothing predominance of this does not help to rule and tailor down exactly what is going on here. Fortunately I do not see any signs of active infection systemically which is great news. With that being said locally I am definitely seeing some issues here. In fact this is warm to touch around the sacral area and I think that cellulitis is definitely present I just cannot pinpoint the exact organism. With that being said I did discuss with the patient as well today that I do believe that he probably needs an MRI in order to further quantify the extent of her likely osteomyelitis and this again is something that we are going to order this will need to be without contrast due to the fact that he is allergic to contrast dye. 11-18-2022 upon evaluation today patient's wound is actually showing some signs of improvement currently. We have been doing the Dakin's moistened gauze dressing which I think is helping to clean up the wound for seeing some granulation growing and still the tissue is not quite as healthy as I would love to see but better than previous. He does have osteomyelitis but the good news is he seems to be treated well with the antibiotics he does have a refill that was just picked up and I think that he needs to continue to take that for certain. 3/7; patient is on a 6-week clinic hiatus. He lives at home with his sister however she is not at home all the time. He  transfers out of bed and a sliding board. He claims he is only in the in the wheelchair about 4 5 hours. He does have underlying osteomyelitis I am not sure about the status of the antibiotics here. Part of the wound still easily probes to bone 12-23-2022 upon evaluation today patient appears to be doing well currently in regard to his wound  all things considered there is still a lot of hypergranulation but I do feel like that with the Vashe moistened gauze is actually doing better than he was previously with the Hydrofera Blue. This just did not seem to be staying in place and with the Vashe through able to change this more frequently. Readmission: 03-31-2023 this is a gentleman whom I have seen multiple times intermittently throughout the years compliance has been a real issue however. I do feel like there is some significant issues here still with the fact that dressing changes and keeping dressings in place are still pretty much as a significant problem here for [place]. I do not see any signs of active infection at this time which is great news but at the same time the wounds do not worse in fact he has another wound different than what I even noted previous. Subsequently he I do believe that the patient should continue to do monitor for any evidence of infection or worsening. Overall I think he needs to have regular follow-up visits with [place] and I think that he needs to have dressings in place at all times. He tells me that he is at home primarily by himself transferring himself from the first thing in the morning through at least 3 in the afternoon as his sister works he really needs [place], [place] ([id number]) [id number] Page 10 of 12 somebody gets a caregiver with him throughout the day. 04-12-2023 upon evaluation today patient appears to be doing a little worse currently in regard to his wounds. He has been tolerating the dressing changes without complication.  Fortunately I do not see any evidence of active infection locally nor systemically at this point. 04-21-2023 upon evaluation today patient's wound unfortunately appears to have purulent drainage and also there is a malodorous drainage at this point. Unfortunately I think that there is something going on here that does not seem to be doing nearly as good as what we would like to see. I think that he is likely going to require some antibiotics. He has taken Cipro before without complication. 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 On 7/18 he had purulent drainage he had a PCR culture and is now on Cipro he is tolerating this well. Using Hydrofera Blue's a 2 bit and border foam his sister is changing the dressing After his sister goes to work the patient is apparently in a wheelchair all day. There is no way to heal the wound like this with that degree of prolonged pressure. 05-12-2023 upon evaluation today patient unfortunately appears to be doing much worse than what we previously seen. Last time he was here he was seen by Dr. Leanord Hawking. This was shortly after I put him on Cipro he was tolerating that well. Nonetheless he did not show up last week apparently there was a transportation issue. This week upon arrival this wound appears to be doing significantly worse there is a significant malodorous drainage and this is deeper he has erythema in the gluteal region around it appears to be infected and to be honest despite the Cipro and oral antibiotics he seems to be doing worse not better. 05-19-2023 upon evaluation patient appears to be doing better than last time I saw him with regard to his wound in the right gluteal location. Fortunately there does not appear to be any signs of systemic infection though he does have evidence of osteomyelitis noted which were acute on chronic  and that was the findings that were noted in the hospital when he was admitted most recently  after I sent him from May 12, 2023 through May 17, 2023. The patient was treated with IV Zosyn and bank while in the hospital and discharged on Augmentin although that has not been picked up yet it has been 2 days since he was discharged he states that "the pharmacy never called his sister to let her know it was ready". Nonetheless this needs to be gotten ASAP to make sure that he is fully treated as far as the infection is concerned I discussed that with him today as well. 05-26-2023 upon evaluation today patient appears to be doing better today with regard to his wounds compared to what we have noticed previous. Fortunately I do not see any evidence of active infection locally or systemically which is great news and in general I do believe that we are making headway towards complete closure. With that being said we still had some trouble getting the order done for the trapeze and aids to help him with regard to his daily activities and preventing the dressings from coming off. We have reached out to his CAP Worker and have not gotten in touch with her as of yet. I did recommend that the patient try to see. He or his sister to get in touch with [facility] and see if they can help [place] in that regard. 8/29; patient with 2 wounds in close juxtaposition on the right buttock. Both of these probes very close to the bone especially the area over the ischial tuberosity. Patient was recently in the hospital for additional antibiotics I did not look at this today. We are using Dakin's wet to dry. His sister changes in dressings. The wounds are measuring larger. 06-16-2023 upon evaluation today patient appears to be doing worse currently at this time. Fortunately I do not see any signs of infection which is good news but at the same time he does have deep tissue injury which has me concerned about potential further breakdown here. He does tell me that he has been up on it a little bit more. 07-12-2023 upon  evaluation today patient appears to be doing okay in regard to his wound there is actually an area that is merged between the 2 and therefore this is really just 1 wound we converted it as such. There is no separation between the 2. I knew that this was going to happen it was just a matter of time. With that being said I do not see any signs of infection obvious at this point but I do believe that this is still an issue with him keeping the dressing in place he tells me it is not staying in place which does have me concerned as well. Fortunately I do not see any evidence of worsening overall systemically though locally I am a little concerned about how the wounds have digressed since I last saw him. 07-28-2023 upon evaluation today patient unfortunately appears to be doing worse with a new wound at this point on the left side that was not there last time I saw him. He has had missed visits in the past 4 visits he has missed 2 of those unfortunately. This again was a condition about seeing him back that he had to make sure that he showed up for all visits. That is not happening at the moment and I have discussion with him today about that. I discussed that if he  is not going to show up for his visits that we are going to have to discharge him that was the original agreement and that is still where things stand at this point to be honest. The patient voiced understanding. I told him that we are going to give him 1 more shot today before we would proceed to anything like that but again if he is not coming to the appointments as directed that there is really no point and has continued to have them on the books as far as trying to get this healed is concerned. He voiced understanding. He still tells me that his dressings were also not staying in place in fact he tells me that every time he gets out of the bed the dressing is already off. He came in today with no dressing on. Noncompliance has been a  significant issue through the course of the treatment with this gentleman and still continues to be so. 08-04-2023 upon evaluation today patient's wound actually appears to be doing better than it has for quite some time. I am actually seeing some improvement which is good news and in general I do believe that we will make an some headway here. I do not see any signs of obvious infection there is also no need for obvious debridement today. Overall I think that we are on the right track. He did actually have a dressing in place upon arrival today. Objective Constitutional Well-nourished and well-hydrated in no acute distress. Vitals Time Taken: 2:02 PM, Height: 59 in, Weight: 90 lbs, BMI: 18.2, Temperature: 97.5 F, Pulse: 86 bpm, Respiratory Rate: 16 breaths/min, Blood Pressure: 107/69 mmHg. Respiratory normal breathing without difficulty. Psychiatric this patient is able to make decisions and demonstrates good insight into disease process. Alert and Oriented x 3. pleasant and cooperative. General Notes: Upon evaluation patient's wound currently actually appears to be doing well this is actually one of the best visits have had with him as far as how the wound appears for quite some time. I actually feel like that he is kept the dressing in place is sister seems to be doing a good job as far as that is concerned and I am very happy in that regard. Integumentary (Hair, Skin) Wound #5 status is Open. Original cause of wound was Gradually Appeared. The date acquired was: 10/04/2021. The wound has been in treatment 18 weeks. [place], [place] ([id number]) [id number] Page 11 of 12 wound is located on the Right,Midline Gluteus. The wound measures 4.8cm length x 9.8cm width x 1.4cm depth; 36.945cm^2 area and 51.723cm^3 volume. There is bone, muscle, and Fat Layer (Subcutaneous Tissue) exposed. There is undermining starting at 6:00 and ending at 8:00 with a maximum distance  of 2.4cm. There is additional undermining and at 12:00 and ending at 2:00 with a maximum distance of 1.1cm. There is a large amount of serosanguineous drainage noted. There is medium (34-66%) red, pink granulation within the wound bed. There is a medium (34-66%) amount of necrotic tissue within the wound bed. Wound #7 status is Open. Original cause of wound was Pressure Injury. The date acquired was: 07/28/2023. The wound has been in treatment 1 weeks. The wound is located on the Left Gluteus. The wound measures 0.9cm length x 1cm width x 0.1cm depth; 0.707cm^2 area and 0.071cm^3 volume. There is Fat Layer (Subcutaneous Tissue) exposed. There is a medium amount of serosanguineous drainage noted. The wound margin is distinct with the outline attached to the wound base. There is medium (34-66%)  pink granulation within the wound bed. There is a medium (34-66%) amount of necrotic tissue within the wound bed including Adherent Slough. Assessment Active Problems ICD-10 Other chronic osteomyelitis, other site Pressure ulcer of right buttock, stage 4 Pressure ulcer of left buttock, stage 3 Spina bifida occulta Colostomy status Essential (primary) hypertension Atherosclerotic heart disease of native coronary artery without angina pectoris Plan Follow-up Appointments: Return Appointment in 1 week. Bathing/ Shower/ Hygiene: Wash wounds with antibacterial soap and water. Anesthetic (Use 'Patient Medications' Section for Anesthetic Order Entry): Lidocaine applied to wound bed Off-Loading: Gel wheelchair cushion Hospital bed/mattress - ETHOS Low air-loss mattress (Group 2) - HOSPITAL BED WITH GROUPE 2 AIR MATTRESS AND TRAPEZE BAR ETHOS Turn and reposition every 2 hours WOUND #5: - Gluteus Wound Laterality: Right, Midline Cleanser: Byram Ancillary Kit - 15 Day Supply (Generic) 3 x Per Week/30 Days Discharge Instructions: Use supplies as instructed; Kit contains: (15) Saline Bullets; (15) 3x3 Gauze;  15 pr Gloves Prim Dressing: Gauze 3 x Per Week/30 Days ary Discharge Instructions: moistened with Dakins Solution Secondary Dressing: Zetuvit Plus 4x4 (in/in) 3 x Per Week/30 Days Secured With: Medipore T - 73M Medipore H Soft Cloth Surgical T ape ape, 2x2 (in/yd) 3 x Per Week/30 Days WOUND #7: - Gluteus Wound Laterality: Left Cleanser: Byram Ancillary Kit - 15 Day Supply (Generic) 3 x Per Week/30 Days Discharge Instructions: Use supplies as instructed; Kit contains: (15) Saline Bullets; (15) 3x3 Gauze; 15 pr Gloves Prim Dressing: Silvercel Small 2x2 (in/in) 3 x Per Week/30 Days ary Discharge Instructions: Apply Silvercel Small 2x2 (in/in) as instructed Secured With: T egaderm Film Transparent 4x4.75 (in/in) 3 x Per Week/30 Days Discharge Instructions: Apply to wound bed 1. Based on what I am seeing I do believe that the patient is making excellent headway towards closure which is great news. I do not see any signs of active infection at this time. 2. I am going to recommend that we should use a silver alginate dressing followed by the T egaderm to cover. 3. With regard to the primary wound we will use of Dakin's moistened gauze dressing followed by the ABD pad secured with tape his sister does seem to be doing well as far as changing this regularly. I am much happier with how things appear today. We will see patient back for reevaluation in 1 week here in the clinic. If anything worsens or changes patient will contact our office for additional recommendations. Electronic Signature(s) Signed: 08/04/2023 2:43:23 PM By: Allen Derry PA-C Entered By: Allen Derry on 08/04/2023 14:43:23 [name] ([id number]) [id number] Page 12 of 12 -------------------------------------------------------------------------------- SuperBill Details Patient Name: Date of ServiceEulas [name] [facility], [name] 08/04/2023 Medical Record Number: [id number] Patient Account Number:  [id number] Date of Birth/Sex: Treating RN: [birth year] (57 y.o. [name] Primary Care Provider: Aram Beecham Other Clinician: Betha Loa Referring Provider: Treating Provider/Extender: Gabriel Earing in Treatment: 18 Diagnosis Coding ICD-10 Codes Code Description [phone] Other chronic osteomyelitis, other site L89.314 Pressure ulcer of right buttock, stage 4 L89.323 Pressure ulcer of left buttock, stage 3 Q76.0 Spina bifida occulta Z93.3 Colostomy status I10 Essential (primary) hypertension I25.10 Atherosclerotic heart disease of native coronary artery without angina pectoris Facility Procedures : CPT4 Code: [id number] Description: 99213 - WOUND CARE VISIT-LEV 3 EST PT Modifier: Quantity: 1 Physician Procedures : CPT4 Code Description Modifier [id number] 99213 - WC PHYS LEVEL 3 - EST PT ICD-10 Diagnosis Description M86.68 Other chronic osteomyelitis, other site L89.314 Pressure ulcer of right  buttock, stage 4 L89.323 Pressure ulcer of left buttock, stage 3 Q76.0  Spina bifida occulta Quantity: 1 Electronic Signature(s) Signed: 08/04/2023 2:43:42 PM By: Allen Derry PA-C Entered By: Allen Derry on 08/04/2023 14:43:42

## 2023-08-05 NOTE — Progress Notes (Addendum)
[name], Chase [place] ([id number]) [id number] Page 1 of 11 Visit Report for 08/04/2023 Arrival Information Details Patient Name: Date of Service: [name] [facility], DO [place] LD 08/04/2023 1:45 PM Medical Record Number: [id number] Patient Account Number: [id number] Date of Birth/Sex: Treating RN: December 12, 1965 (57 y.o. [name] Primary Care Landra Howze: Aram Beecham Other Clinician: Betha Loa Referring Bates Collington: Treating Dorraine Ellender/Extender: Gabriel Earing in Treatment: 18 Visit Information History Since Last Visit All ordered tests and consults were completed: No Patient Arrived: Wheel Chair Added or deleted any medications: No Arrival Time: 13:56 Any new allergies or adverse reactions: No Transfer Assistance: Transfer Board Had a fall or experienced change in No Patient Identification Verified: Yes activities of daily living that may affect Secondary Verification Process Completed: Yes risk of falls: Patient Requires Transmission-Based Precautions: No Signs or symptoms of abuse/neglect since last visito No Patient Has Alerts: No Hospitalized since last visit: No Implantable device outside of the clinic excluding No cellular tissue based products placed in the center since last visit: Has Dressing in Place as Prescribed: Yes Pain Present Now: No Electronic Signature(s) Signed: 08/05/2023 12:43:51 PM By: Betha Loa Entered By: Betha Loa on 08/04/2023 14:01:35 -------------------------------------------------------------------------------- Clinic Level of Care Assessment Details Patient Name: Date of ServiceEulas [name] [facility], [name] 08/04/2023 1:45 PM Medical Record Number: [id number] Patient Account Number: [id number] Date of Birth/Sex: Treating RN: [birth year] (56 y.o. [name] Primary Care Denvil Canning: Aram Beecham Other Clinician: Betha Loa Referring Berdene Askari: Treating Sherwin Hollingshed/Extender: Gabriel Earing in Treatment: 18 Clinic Level of Care Assessment Items TOOL 4 Quantity Score []  - 0 Use when only an EandM is performed on FOLLOW-UP visit ASSESSMENTS - Nursing Assessment / Reassessment X- 1 10 Reassessment of Co-morbidities (includes updates in patient status) X- 1 5 Reassessment of Adherence to Treatment Plan AERO, STANISLAW ([id number]) [id number] Page 2 of 11 ASSESSMENTS - Wound and Skin A ssessment / Reassessment []  - 0 Simple Wound Assessment / Reassessment - one wound X- 2 5 Complex Wound Assessment / Reassessment - multiple wounds []  - 0 Dermatologic / Skin Assessment (not related to wound area) ASSESSMENTS - Focused Assessment []  - 0 Circumferential Edema Measurements - multi extremities []  - 0 Nutritional Assessment / Counseling / Intervention []  - 0 Lower Extremity Assessment (monofilament, tuning fork, pulses) []  - 0 Peripheral Arterial Disease Assessment (using hand held doppler) ASSESSMENTS - Ostomy and/or Continence Assessment and Care []  - 0 Incontinence Assessment and Management []  - 0 Ostomy Care Assessment and Management (repouching, etc.) PROCESS - Coordination of Care []  - 0 Simple Patient / Family Education for ongoing care X- 1 20 Complex (extensive) Patient / Family Education for ongoing care []  - 0 Staff obtains Chiropractor, Records, T Results / Process Orders est []  - 0 Staff telephones HHA, Nursing Homes / Clarify orders / etc []  - 0 Routine Transfer to another Facility (non-emergent condition) []  - 0 Routine Hospital Admission (non-emergent condition) []  - 0 New Admissions / Manufacturing engineer / Ordering NPWT Apligraf, etc. , []  - 0 Emergency Hospital Admission (emergent condition) X- 1 10 Simple Discharge Coordination []  - 0 Complex (extensive) Discharge Coordination PROCESS - Special Needs []  - 0 Pediatric / Minor Patient Management []  - 0 Isolation Patient Management []  -  0 Hearing / Language / Visual special needs []  - 0 Assessment of Community assistance (transportation, D/C planning, etc.) []  - 0 Additional assistance / Altered mentation []  - 0 Support Surface(s) Assessment (bed, cushion, seat, etc.) INTERVENTIONS -  Wound Cleansing / Measurement []  - 0 Simple Wound Cleansing - one wound X- 2 5 Complex Wound Cleansing - multiple wounds X- 1 5 Wound Imaging (photographs - any number of wounds) []  - 0 Wound Tracing (instead of photographs) []  - 0 Simple Wound Measurement - one wound X- 2 5 Complex Wound Measurement - multiple wounds INTERVENTIONS - Wound Dressings []  - 0 Small Wound Dressing one or multiple wounds X- 2 15 Medium Wound Dressing one or multiple wounds []  - 0 Large Wound Dressing one or multiple wounds []  - 0 Application of Medications - topical []  - 0 Application of Medications - injection INTERVENTIONS - Miscellaneous []  - 0 External ear exam HARRELL, ALIKHAN ([id number]) [id number] Page 3 of 11 []  - 0 Specimen Collection (cultures, biopsies, blood, body fluids, etc.) []  - 0 Specimen(s) / Culture(s) sent or taken to Lab for analysis []  - 0 Patient Transfer (multiple staff / Michiel Sites Lift / Similar devices) []  - 0 Simple Staple / Suture removal (25 or less) []  - 0 Complex Staple / Suture removal (26 or more) []  - 0 Hypo / Hyperglycemic Management (close monitor of Blood Glucose) []  - 0 Ankle / Brachial Index (ABI) - do not check if billed separately X- 1 5 Vital Signs Has the patient been seen at the hospital within the last three years: Yes Total Score: 115 Level Of Care: New/Established - Level 3 Electronic Signature(s) Signed: 08/05/2023 12:43:51 PM By: Betha Loa Entered By: Betha Loa on 08/04/2023 14:30:54 -------------------------------------------------------------------------------- Complex / Palliative Patient Assessment Details Patient Name: Date of ServiceEulas [name]  [facility], [name] 08/04/2023 1:45 PM Medical Record Number: [id number] Patient Account Number: [id number] Date of Birth/Sex: Treating RN: December 03, 1965 (57 y.o. [name] Primary Care Natan Hartog: Aram Beecham Other Clinician: Betha Loa Referring Quanesha Klimaszewski: Treating Ashir Kunz/Extender: Gabriel Earing in Treatment: 18 Complex Wound Management Criteria Patient has remarkable or complex co-morbidities requiring medications or treatments that extend wound healing times. Examples: Diabetes mellitus with chronic renal failure or end stage renal disease requiring dialysis Advanced or poorly controlled rheumatoid arthritis Diabetes mellitus and end stage chronic obstructive pulmonary disease Active cancer with current chemo- or radiation therapy HTN, spina bifida, CKD Palliative Wound Management Criteria Care Approach Wound Care Plan: Complex Wound Management Electronic Signature(s) Signed: 08/10/2023 9:48:27 AM By: [name] RN Signed: 08/18/2023 7:58:12 AM By: Allen Derry PA-C Entered By: [name] on 08/10/2023 09:48:26 [name] ([id number]) [id number] Page 4 of 11 -------------------------------------------------------------------------------- Encounter Discharge Information Details Patient Name: Date of ServiceEulas [name] [facility], [name] 08/04/2023 1:45 PM Medical Record Number: [id number] Patient Account Number: [id number] Date of Birth/Sex: Treating RN: 1966/09/27 (57 y.o. [name] Primary Care Kaydee Magel: Aram Beecham Other Clinician: Betha Loa Referring Deshonna Trnka: Treating Dorian Duval/Extender: Gabriel Earing in Treatment: 18 Encounter Discharge Information Items Discharge Condition: Stable Ambulatory Status: Wheelchair Discharge Destination: Home Transportation: Other Accompanied By: self Schedule Follow-up Appointment: Yes Clinical Summary of Care: Electronic Signature(s) Signed:  08/05/2023 12:43:51 PM By: Betha Loa Entered By: Betha Loa on 08/04/2023 14:55:15 -------------------------------------------------------------------------------- Lower Extremity Assessment Details Patient Name: Date of ServiceEulas [name] [facility], [name] 08/04/2023 1:45 PM Medical Record Number: [id number] Patient Account Number: [id number] Date of Birth/Sex: Treating RN: January 03, 1966 (57 y.o. [name] Primary Care Mickenzie Stolar: Aram Beecham Other Clinician: Betha Loa Referring Gentry Pilson: Treating Alnisa Hasley/Extender: Hermine Messick Weeks in Treatment: 18 Electronic Signature(s) Signed: 08/04/2023 4:17:46 PM By: Angelina Pih Signed: 08/05/2023 12:43:51 PM By: Glynda Jaeger  Angie Entered By: Betha Loa on 08/04/2023 14:15:47 [name] ([id number]) [id number] Page 5 of 11 -------------------------------------------------------------------------------- Multi Wound Chart Details Patient Name: Date of ServiceEulas [name] [facility], [name] 08/04/2023 1:45 PM Medical Record Number: [id number] Patient Account Number: [id number] Date of Birth/Sex: Treating RN: 1965-12-12 (57 y.o. [name] Primary Care Sreekar Broyhill: Aram Beecham Other Clinician: Betha Loa Referring Delsin Copen: Treating Akshara Blumenthal/Extender: Hermine Messick Weeks in Treatment: 18 Vital Signs Height(in): 59 Pulse(bpm): 86 Weight(lbs): 90 Blood Pressure(mmHg): 107/69 Body Mass Index(BMI): 18.2 Temperature(F): 97.5 Respiratory Rate(breaths/min): 16 [5:Photos:] [N/A:N/A] Right, Midline Gluteus Left Gluteus N/A Wound Location: Gradually Appeared Pressure Injury N/A Wounding Event: Pressure Ulcer Pressure Ulcer N/A Primary Etiology: Cataracts, Glaucoma, Coronary Artery Cataracts, Glaucoma, Coronary Artery N/A Comorbid History: Disease, Hypertension, History of Disease, Hypertension, History of pressure wounds, Osteoarthritis, pressure wounds,  Osteoarthritis, Paraplegia Paraplegia 10/04/2021 07/28/2023 N/A Date Acquired: 18 1 N/A Weeks of Treatment: Open Open N/A Wound Status: No No N/A Wound Recurrence: 4.8x9.8x1.4 0.9x1x0.1 N/A Measurements L x W x D (cm) 36.945 0.707 N/A A (cm) : rea 51.723 0.071 N/A Volume (cm) : -276.30% 40.00% N/A % Reduction in A rea: -251.20% 39.80% N/A % Reduction in Volume: 6 Starting Position 1 (o'clock): 8 Ending Position 1 (o'clock): 2.4 Maximum Distance 1 (cm): 12 Starting Position 2 (o'clock): 2 Ending Position 2 (o'clock): 1.1 Maximum Distance 2 (cm): Yes N/A N/A Undermining: Category/Stage IV Category/Stage III N/A Classification: Large Medium N/A Exudate A mount: Serosanguineous Serosanguineous N/A Exudate Type: red, brown red, brown N/A Exudate Color: N/A Distinct, outline attached N/A Wound Margin: Medium (34-66%) Medium (34-66%) N/A Granulation A mount: Red, Pink Pink N/A Granulation Quality: Medium (34-66%) Medium (34-66%) N/A Necrotic A mount: Fat Layer (Subcutaneous Tissue): Yes Fat Layer (Subcutaneous Tissue): Yes N/A Exposed Structures: Muscle: Yes Fascia: No Bone: Yes Tendon: No Fascia: No Muscle: No Tendon: No Joint: No Joint: No Bone: No None None N/A Epithelialization: Treatment Notes DEMETRION, BLUNK ([id number]) [id number] Page 6 of 11 Electronic Signature(s) Signed: 08/05/2023 12:43:51 PM By: Betha Loa Entered By: Betha Loa on 08/04/2023 14:15:52 -------------------------------------------------------------------------------- Multi-Disciplinary Care Plan Details Patient Name: Date of Service: [name] [facility], [name] 08/04/2023 1:45 PM Medical Record Number: [id number] Patient Account Number: [id number] Date of Birth/Sex: Treating RN: 12-20-1965 (57 y.o. [name] Primary Care Hawthorne Day: Aram Beecham Other Clinician: Betha Loa Referring Concha Sudol: Treating Bawi Lakins/Extender: Gabriel Earing in Treatment: 18 Active Inactive Pressure Nursing Diagnoses: Potential for impaired tissue integrity related to pressure, friction, moisture, and shear Goals: Patient will remain free from development of additional pressure ulcers Date Initiated: 03/31/2023 Target Resolution Date: 07/01/2023 Goal Status: Active Interventions: Assess: immobility, friction, shearing, incontinence upon admission and as needed Assess offloading mechanisms upon admission and as needed Assess potential for pressure ulcer upon admission and as needed Notes: Wound/Skin Impairment Nursing Diagnoses: Knowledge deficit related to ulceration/compromised skin integrity Goals: Patient/caregiver will verbalize understanding of skin care regimen Date Initiated: 03/31/2023 Target Resolution Date: 07/01/2023 Goal Status: Active Ulcer/skin breakdown will have a volume reduction of 30% by week 4 Date Initiated: 03/31/2023 Date Inactivated: 06/02/2023 Target Resolution Date: 05/31/2023 Goal Status: Unmet Unmet Reason: comorbidities Ulcer/skin breakdown will have a volume reduction of 50% by week 8 Date Initiated: 03/31/2023 Target Resolution Date: 07/01/2023 Goal Status: Active Ulcer/skin breakdown will have a volume reduction of 80% by week 12 Date Initiated: 03/31/2023 Target Resolution Date: 07/31/2023 Goal Status: Active Ulcer/skin breakdown will heal within 14 weeks Date Initiated: 03/31/2023 Target Resolution Date: 08/31/2023  Goal Status: Active Interventions: Assess patient/caregiver ability to obtain necessary supplies Assess patient/caregiver ability to perform ulcer/skin care regimen upon admission and as needed Assess ulceration(s) every visit ALFONZO, OVERMAN ([id number]) [id number] Page 7 of 11 Notes: Electronic Signature(s) Signed: 08/04/2023 4:17:46 PM By: Angelina Pih Signed: 08/05/2023 12:43:51 PM By: Betha Loa Entered By: Betha Loa on 08/04/2023 14:31:09 -------------------------------------------------------------------------------- Pain Assessment Details Patient Name: Date of Service: [name] [facility], [name] 08/04/2023 1:45 PM Medical Record Number: [id number] Patient Account Number: [id number] Date of Birth/Sex: Treating RN: [birth year] (57 y.o. [name] Primary Care Alysiah Suppa: Aram Beecham Other Clinician: Betha Loa Referring Kripa Foskey: Treating Lougenia Morrissey/Extender: Hermine Messick Weeks in Treatment: 18 Active Problems Location of Pain Severity and Description of Pain Patient Has Paino No Site Locations Pain Management and Medication Current Pain Management: Electronic Signature(s) Signed: 08/04/2023 4:17:46 PM By: Angelina Pih Signed: 08/05/2023 12:43:51 PM By: Betha Loa Entered By: Betha Loa on 08/04/2023 14:04:41 [name] ([id number]) [id number] Page 8 of 11 -------------------------------------------------------------------------------- Patient/Caregiver Education Details Patient Name: Date of Service: [name] [facility], DO [place] LD 10/31/2024andnbsp1:45 PM Medical Record Number: [id number] Patient Account Number: [id number] Date of Birth/Gender: Treating RN: 01/19/1966 (57 y.o. [name] Primary Care Physician: Aram Beecham Other Clinician: Betha Loa Referring Physician: Treating Physician/Extender: Gabriel Earing in Treatment: 18 Education Assessment Education Provided To: Patient Education Topics Provided Wound/Skin Impairment: Handouts: Other: continue wound care as directed Methods: Explain/Verbal Responses: State content correctly Electronic Signature(s) Signed: 08/05/2023 12:43:51 PM By: Betha Loa Entered By: Betha Loa on 08/04/2023 14:31:28 -------------------------------------------------------------------------------- Wound Assessment Details Patient Name: Date of  ServiceEulas [name] [facility], [name] 08/04/2023 1:45 PM Medical Record Number: [id number] Patient Account Number: [id number] Date of Birth/Sex: Treating RN: 1965-11-12 (57 y.o. [name] Primary Care Zanna Hawn: Aram Beecham Other Clinician: Betha Loa Referring Bates Collington: Treating Bright Spielmann/Extender: Hermine Messick Weeks in Treatment: 18 Wound Status Wound Number: 5 Primary Pressure Ulcer Etiology: Wound Location: Right, Midline Gluteus Wound Open Wounding Event: Gradually Appeared Status: Date Acquired: 10/04/2021 Comorbid Cataracts, Glaucoma, Coronary Artery Disease, Hypertension, Weeks Of Treatment: 18 History: History of pressure wounds, Osteoarthritis, Paraplegia Clustered Wound: No Photos [place], [place] ([id number]) [id number] Page 9 of 11 Wound Measurements Length: (cm) 4.8 Width: (cm) 9.8 Depth: (cm) 1.4 Area: (cm) 36.945 Volume: (cm) 51.723 % Reduction in Area: -276.3% % Reduction in Volume: -251.2% Epithelialization: None Undermining: Yes Location 1 Starting Position (o'clock): 6 Ending Position (o'clock): 8 Maximum Distance: (cm) 2.4 Location 2 Starting Position (o'clock): 12 Ending Position (o'clock): 2 Maximum Distance: (cm) 1.1 Wound Description Classification: Category/Stage IV Exudate Amount: Large Exudate Type: Serosanguineous Exudate Color: red, brown Foul Odor After Cleansing: No Slough/Fibrino Yes Wound Bed Granulation Amount: Medium (34-66%) Exposed Structure Granulation Quality: Red, Pink Fascia Exposed: No Necrotic Amount: Medium (34-66%) Fat Layer (Subcutaneous Tissue) Exposed: Yes Tendon Exposed: No Muscle Exposed: Yes Necrosis of Muscle: No Joint Exposed: No Bone Exposed: Yes Electronic Signature(s) Signed: 08/04/2023 4:17:46 PM By: Angelina Pih Signed: 08/05/2023 12:43:51 PM By: Betha Loa Entered By: Betha Loa on 08/04/2023  14:14:33 -------------------------------------------------------------------------------- Wound Assessment Details Patient Name: Date of Service: [name] [facility], [name] 08/04/2023 1:45 PM Medical Record Number: [id number] Patient Account Number: [id number] Date of Birth/Sex: Treating RN: 08/23/1966 (57 y.o. [name] Primary Care Kristeen Lantz: Aram Beecham Other Clinician: Betha Loa Referring Aryaan Persichetti: Treating Rosie Golson/Extender: Hermine Messick Weeks in Treatment: 18 Wound Status Wound Number: 7 Primary Pressure Ulcer Etiology: MALAKHI, KIBBEY ([id number]) [id number]  Page 10 of 11 Etiology: Wound Location: Left Gluteus Wound Open Wounding Event: Pressure Injury Status: Date Acquired: 07/28/2023 Comorbid Cataracts, Glaucoma, Coronary Artery Disease, Hypertension, Weeks Of Treatment: 1 History: History of pressure wounds, Osteoarthritis, Paraplegia Clustered Wound: No Photos Wound Measurements Length: (cm) 0.9 Width: (cm) 1 Depth: (cm) 0.1 Area: (cm) 0.707 Volume: (cm) 0.071 % Reduction in Area: 40% % Reduction in Volume: 39.8% Epithelialization: None Wound Description Classification: Category/Stage III Wound Margin: Distinct, outline attached Exudate Amount: Medium Exudate Type: Serosanguineous Exudate Color: red, brown Foul Odor After Cleansing: No Slough/Fibrino Yes Wound Bed Granulation Amount: Medium (34-66%) Exposed Structure Granulation Quality: Pink Fascia Exposed: No Necrotic Amount: Medium (34-66%) Fat Layer (Subcutaneous Tissue) Exposed: Yes Necrotic Quality: Adherent Slough Tendon Exposed: No Muscle Exposed: No Joint Exposed: No Bone Exposed: No Electronic Signature(s) Signed: 08/04/2023 4:17:46 PM By: Angelina Pih Signed: 08/05/2023 12:43:51 PM By: Betha Loa Entered By: Betha Loa on 08/04/2023  14:14:58 -------------------------------------------------------------------------------- Vitals Details Patient Name: Date of Service: [name] TH, [name] 08/04/2023 1:45 PM Medical Record Number: [id number] Patient Account Number: [id number] Date of Birth/Sex: Treating RN: 1965/10/25 (57 y.o. [name] Primary Care Miesha Bachmann: Aram Beecham Other Clinician: Betha Loa Referring Noralyn Karim: Treating Dejon Jungman/Extender: Hermine Messick Weeks in Treatment: 18 Vital Signs Time Taken: 14:02 Temperature (F): 97.5 [place], [place] ([id number]) [id number] Page 11 of 11 Height (in): 59 Pulse (bpm): 86 Weight (lbs): 90 Respiratory Rate (breaths/min): 16 Body Mass Index (BMI): 18.2 Blood Pressure (mmHg): 107/69 Reference Range: 80 - 120 mg / dl Electronic Signature(s) Signed: 08/05/2023 12:43:51 PM By: Betha Loa Entered By: Betha Loa on 08/04/2023 14:04:37

## 2023-08-08 ENCOUNTER — Emergency Department: Payer: 59

## 2023-08-08 ENCOUNTER — Other Ambulatory Visit: Payer: Self-pay

## 2023-08-08 ENCOUNTER — Inpatient Hospital Stay
Admission: EM | Admit: 2023-08-08 | Discharge: 2023-08-12 | DRG: 871 | Disposition: A | Discharge status: Home / Self Care | Payer: 59 | Attending: Internal Medicine | Admitting: Internal Medicine

## 2023-08-08 DIAGNOSIS — E876 Hypokalemia: Secondary | ICD-10-CM | POA: Diagnosis present

## 2023-08-08 DIAGNOSIS — L0390 Cellulitis, unspecified: Secondary | ICD-10-CM | POA: Diagnosis present

## 2023-08-08 DIAGNOSIS — L89314 Pressure ulcer of right buttock, stage 4: Secondary | ICD-10-CM | POA: Diagnosis present

## 2023-08-08 DIAGNOSIS — L89329 Pressure ulcer of left buttock, unspecified stage: Secondary | ICD-10-CM | POA: Diagnosis present

## 2023-08-08 DIAGNOSIS — Q059 Spina bifida, unspecified: Secondary | ICD-10-CM | POA: Diagnosis not present

## 2023-08-08 DIAGNOSIS — Z882 Allergy status to sulfonamides status: Secondary | ICD-10-CM

## 2023-08-08 DIAGNOSIS — N319 Neuromuscular dysfunction of bladder, unspecified: Secondary | ICD-10-CM | POA: Diagnosis present

## 2023-08-08 DIAGNOSIS — N390 Urinary tract infection, site not specified: Secondary | ICD-10-CM | POA: Diagnosis present

## 2023-08-08 DIAGNOSIS — I129 Hypertensive chronic kidney disease with stage 1 through stage 4 chronic kidney disease, or unspecified chronic kidney disease: Secondary | ICD-10-CM | POA: Diagnosis present

## 2023-08-08 DIAGNOSIS — K219 Gastro-esophageal reflux disease without esophagitis: Secondary | ICD-10-CM | POA: Diagnosis present

## 2023-08-08 DIAGNOSIS — A419 Sepsis, unspecified organism: Principal | ICD-10-CM | POA: Diagnosis present

## 2023-08-08 DIAGNOSIS — S31000A Unspecified open wound of lower back and pelvis without penetration into retroperitoneum, initial encounter: Secondary | ICD-10-CM | POA: Diagnosis not present

## 2023-08-08 DIAGNOSIS — Z936 Other artificial openings of urinary tract status: Secondary | ICD-10-CM | POA: Diagnosis not present

## 2023-08-08 DIAGNOSIS — E871 Hypo-osmolality and hyponatremia: Secondary | ICD-10-CM | POA: Diagnosis present

## 2023-08-08 DIAGNOSIS — Z91040 Latex allergy status: Secondary | ICD-10-CM

## 2023-08-08 DIAGNOSIS — Z87442 Personal history of urinary calculi: Secondary | ICD-10-CM | POA: Diagnosis not present

## 2023-08-08 DIAGNOSIS — N1330 Unspecified hydronephrosis: Secondary | ICD-10-CM | POA: Diagnosis present

## 2023-08-08 DIAGNOSIS — M8660 Other chronic osteomyelitis, unspecified site: Secondary | ICD-10-CM | POA: Diagnosis not present

## 2023-08-08 DIAGNOSIS — M8668 Other chronic osteomyelitis, other site: Secondary | ICD-10-CM | POA: Diagnosis present

## 2023-08-08 DIAGNOSIS — Z833 Family history of diabetes mellitus: Secondary | ICD-10-CM

## 2023-08-08 DIAGNOSIS — Z933 Colostomy status: Secondary | ICD-10-CM | POA: Diagnosis not present

## 2023-08-08 DIAGNOSIS — L89154 Pressure ulcer of sacral region, stage 4: Secondary | ICD-10-CM | POA: Diagnosis present

## 2023-08-08 DIAGNOSIS — Z888 Allergy status to other drugs, medicaments and biological substances status: Secondary | ICD-10-CM

## 2023-08-08 DIAGNOSIS — Z79899 Other long term (current) drug therapy: Secondary | ICD-10-CM

## 2023-08-08 DIAGNOSIS — Z87440 Personal history of urinary (tract) infections: Secondary | ICD-10-CM

## 2023-08-08 DIAGNOSIS — E878 Other disorders of electrolyte and fluid balance, not elsewhere classified: Secondary | ICD-10-CM | POA: Diagnosis present

## 2023-08-08 DIAGNOSIS — Z885 Allergy status to narcotic agent status: Secondary | ICD-10-CM

## 2023-08-08 DIAGNOSIS — D75839 Thrombocytosis, unspecified: Secondary | ICD-10-CM | POA: Diagnosis present

## 2023-08-08 DIAGNOSIS — M21952 Unspecified acquired deformity of left thigh: Secondary | ICD-10-CM | POA: Diagnosis present

## 2023-08-08 DIAGNOSIS — E785 Hyperlipidemia, unspecified: Secondary | ICD-10-CM | POA: Diagnosis present

## 2023-08-08 DIAGNOSIS — Z91041 Radiographic dye allergy status: Secondary | ICD-10-CM

## 2023-08-08 DIAGNOSIS — N3001 Acute cystitis with hematuria: Secondary | ICD-10-CM

## 2023-08-08 DIAGNOSIS — M533 Sacrococcygeal disorders, not elsewhere classified: Secondary | ICD-10-CM | POA: Diagnosis present

## 2023-08-08 DIAGNOSIS — G8220 Paraplegia, unspecified: Secondary | ICD-10-CM | POA: Diagnosis present

## 2023-08-08 DIAGNOSIS — N1831 Chronic kidney disease, stage 3a: Secondary | ICD-10-CM | POA: Diagnosis present

## 2023-08-08 DIAGNOSIS — Z7401 Bed confinement status: Secondary | ICD-10-CM

## 2023-08-08 LAB — COMPREHENSIVE METABOLIC PANEL
ALT: 25 U/L (ref 0–44)
AST: 28 U/L (ref 15–41)
Albumin: 3.1 g/dL — ABNORMAL LOW (ref 3.5–5.0)
Alkaline Phosphatase: 85 U/L (ref 38–126)
Anion gap: 12 (ref 5–15)
BUN: 33 mg/dL — ABNORMAL HIGH (ref 6–20)
CO2: 24 mmol/L (ref 22–32)
Calcium: 9.2 mg/dL (ref 8.9–10.3)
Chloride: 97 mmol/L — ABNORMAL LOW (ref 98–111)
Creatinine, Ser: 1.57 mg/dL — ABNORMAL HIGH (ref 0.61–1.24)
GFR, Estimated: 51 mL/min — ABNORMAL LOW (ref >60)
Glucose, Bld: 61 mg/dL — ABNORMAL LOW (ref 70–99)
Potassium: 2.8 mmol/L — ABNORMAL LOW (ref 3.5–5.1)
Sodium: 133 mmol/L — ABNORMAL LOW (ref 135–145)
Total Bilirubin: 0.4 mg/dL (ref <1.2)
Total Protein: 8.5 g/dL — ABNORMAL HIGH (ref 6.5–8.1)

## 2023-08-08 LAB — URINALYSIS, W/ REFLEX TO CULTURE (INFECTION SUSPECTED)
Bilirubin Urine: NEGATIVE
Glucose, UA: NEGATIVE mg/dL
Ketones, ur: NEGATIVE mg/dL
Nitrite: POSITIVE — AB
Protein, ur: 30 mg/dL — AB
Specific Gravity, Urine: 1.006 (ref 1.005–1.030)
Squamous Epithelial / HPF: 0 /[HPF] (ref 0–5)
WBC, UA: >50 WBC/hpf (ref 0–5)
pH: 7 (ref 5.0–8.0)

## 2023-08-08 LAB — CBC WITH DIFFERENTIAL/PLATELET
Abs Immature Granulocytes: 0.06 10*3/uL (ref 0.00–0.07)
Basophils Absolute: 0.1 10*3/uL (ref 0.0–0.1)
Basophils Relative: 1 %
Eosinophils Absolute: 0.1 10*3/uL (ref 0.0–0.5)
Eosinophils Relative: 1 %
HCT: 41.6 % (ref 39.0–52.0)
Hemoglobin: 12.6 g/dL — ABNORMAL LOW (ref 13.0–17.0)
Immature Granulocytes: 0 %
Lymphocytes Relative: 10 %
Lymphs Abs: 1.3 10*3/uL (ref 0.7–4.0)
MCH: 23.9 pg — ABNORMAL LOW (ref 26.0–34.0)
MCHC: 30.3 g/dL (ref 30.0–36.0)
MCV: 78.8 fL — ABNORMAL LOW (ref 80.0–100.0)
Monocytes Absolute: 1 10*3/uL (ref 0.1–1.0)
Monocytes Relative: 7 %
Neutro Abs: 11 10*3/uL — ABNORMAL HIGH (ref 1.7–7.7)
Neutrophils Relative %: 81 %
Platelets: 499 10*3/uL — ABNORMAL HIGH (ref 150–400)
RBC: 5.28 MIL/uL (ref 4.22–5.81)
RDW: 18.1 % — ABNORMAL HIGH (ref 11.5–15.5)
WBC: 13.6 10*3/uL — ABNORMAL HIGH (ref 4.0–10.5)
nRBC: 0 % (ref 0.0–0.2)

## 2023-08-08 LAB — LACTIC ACID, PLASMA
Lactic Acid, Venous: 2 mmol/L (ref 0.5–1.9)
Lactic Acid, Venous: 2.4 mmol/L (ref 0.5–1.9)

## 2023-08-08 LAB — PROTIME-INR
INR: 1 (ref 0.8–1.2)
Prothrombin Time: 13 s (ref 11.4–15.2)

## 2023-08-08 MED ORDER — SODIUM CHLORIDE 0.9% FLUSH
10.0000 mL | Freq: Two times a day (BID) | INTRAVENOUS | Status: DC
  Administered 2023-08-10 – 2023-08-12 (×6): 10 mL via INTRAVENOUS

## 2023-08-08 MED ORDER — LACTATED RINGERS IV BOLUS
1000.0000 mL | Freq: Once | INTRAVENOUS | Status: AC
Start: 2023-08-08 — End: 2023-08-08
  Administered 2023-08-08: 1000 mL via INTRAVENOUS

## 2023-08-08 MED ORDER — SODIUM CHLORIDE 0.9 % IV SOLN
2.0000 g | Freq: Once | INTRAVENOUS | Status: AC
  Administered 2023-08-08: 2 g via INTRAVENOUS
  Filled 2023-08-08: qty 12.5

## 2023-08-08 MED ORDER — LACTATED RINGERS IV BOLUS
1000.0000 mL | Freq: Once | INTRAVENOUS | Status: AC
  Administered 2023-08-08: 1000 mL via INTRAVENOUS

## 2023-08-08 MED ORDER — VANCOMYCIN HCL IN DEXTROSE 1-5 GM/200ML-% IV SOLN
1000.0000 mg | Freq: Once | INTRAVENOUS | Status: AC
  Administered 2023-08-08: 1000 mg via INTRAVENOUS
  Filled 2023-08-08: qty 200

## 2023-08-08 MED ORDER — METRONIDAZOLE 500 MG/100ML IV SOLN
500.0000 mg | Freq: Once | INTRAVENOUS | Status: AC
  Administered 2023-08-08: 500 mg via INTRAVENOUS
  Filled 2023-08-08: qty 100

## 2023-08-08 NOTE — H&P (Signed)
[place]   PATIENT NAME: [name]    MR#:  [id number]  DATE OF BIRTH:  1966-01-17  DATE OF ADMISSION:  08/08/2023  PRIMARY CARE PHYSICIAN: Marguarite Arbour, MD   Patient is coming from: Home  REQUESTING/REFERRING PHYSICIAN: Claudell Kyle, MD  CHIEF COMPLAINT:   Chief Complaint  Patient presents with   Wound Check    HISTORY OF PRESENT ILLNESS:  [name] is a 57 y.o. Caucasian male with medical history significant for hypertension, dyslipidemia, urolithiasis, spina bifida and neurogenic bladder, sacral decubitus ulcer, who presented to the emergency room with acute onset of worsening pain from his sacral decubitus ulcer.  It was noted by his sister that it was getting bigger.  Patient has a urostomy and a colostomy.  He noticed his urine was smelling offensive recently.  He denied any fever or chills.  No chest pain or palpitations or cough or wheezing.  ED Course: When he came to the ER heart rate was 108 and BP 97/65 with otherwise normal vital signs.  Labs revealed hypokalemia, hyponatremia, hypochloremia, a BUN of 83 with a creatinine of 1.57 with blood glucose of 61 and albumin 3.1 with troponin 8.5.  Lactic acid was 2 and later 2.4.  CBC showed leukocytosis 13.6 with neutrophilia as well as microcytosis and thrombocytosis.  UA was positive for UTI.  Blood cultures were drawn.  Urine culture was drawn.   EKG as reviewed by me : None Imaging:  Pelvic CT without contrast: Large right decubitus ulcer with sequela of chronic osteomyelitis.   Left ischial decubitus ulcer with sequela of chronic osteomyelitis. Associated chronic left hip deformity and effusion.   No superimposed acute inflammatory changes to suggest acute on chronic osteomyelitis.   Postsurgical changes, as above, incompletely visualized.   The patient was given IV vancomycin and set for pain as well as 2 L bolus of IV lactated Ringer.  He will be admitted to medical telemetry bed for  further evaluation and management.  PAST MEDICAL HISTORY:   Past Medical History:  Diagnosis Date   Abdominal distention 10/11/2016   Acute lower UTI 12/22/2019   Acute sepsis (HCC) 04/21/2016   AKI (acute kidney injury) (HCC) 06/27/2020   Decubitus ulcer    Distal intestinal obstruction syndrome (HCC) 10/11/2016   Fecal impaction in rectum (HCC) 10/10/2016   Gastric outlet obstruction 06/03/2016   Gastritis    GERD (gastroesophageal reflux disease) 12/22/2019   HTN (hypertension) 06/27/2020   Hydronephrosis    Hyperlipidemia    Hypokalemia 10/11/2016   Impaction of the bowels (HCC)    Kidney stone    Left ureteral stone 06/27/2020   Leukocytosis 10/11/2016   Loose stools 10/11/2016   Nausea & vomiting 06/03/2016   Renal disorder    SBO (small bowel obstruction) (HCC) 11/02/2017   Sepsis due to gram-negative UTI (HCC) 03/08/2021   Septic shock (HCC) 03/08/2021   SIRS (systemic inflammatory response syndrome) (HCC) 06/03/2016   Spina bifida (HCC)    Ulcer of esophagus with bleeding     PAST SURGICAL HISTORY:   Past Surgical History:  Procedure Laterality Date   ABDOMINAL SURGERY     APPENDECTOMY     BACK SURGERY     COLON SURGERY     CREATION / REVISION OF ILEOSTOMY / JEJUNOSTOMY  05/2015   CYSTOSCOPY WITH RETROGRADE PYELOGRAM, URETEROSCOPY AND STENT PLACEMENT  03/2014   ESOPHAGOGASTRODUODENOSCOPY (EGD) WITH PROPOFOL N/A 06/03/2016   Procedure: ESOPHAGOGASTRODUODENOSCOPY (EGD) WITH PROPOFOL;  Surgeon:  Midge Minium, MD;  Location: [facility] ENDOSCOPY;  Service: Endoscopy;  Laterality: N/A;   ESOPHAGOGASTRODUODENOSCOPY (EGD) WITH PROPOFOL N/A 04/27/2019   Procedure: ESOPHAGOGASTRODUODENOSCOPY (EGD) WITH PROPOFOL;  Surgeon: Wyline Mood, MD;  Location: [facility] ENDOSCOPY;  Service: Gastroenterology;  Laterality: N/A;   ilieostomy     PERCUTANEOUS NEPHROSTOLITHOTOMY     PERCUTANEOUS NEPHROSTOMY     VENTRICULOPERITONEAL SHUNT     vp shunt removal      SOCIAL HISTORY:   Social History   Tobacco Use    Smoking status: Never   Smokeless tobacco: Never  Substance Use Topics   Alcohol use: No    FAMILY HISTORY:   Family History  Problem Relation Age of Onset   Diabetes Mellitus II Father    Bladder Cancer Neg Hx    Kidney cancer Neg Hx    Prostate cancer Neg Hx     DRUG ALLERGIES:   Allergies  Allergen Reactions   Morphine And Codeine Nausea And Vomiting   Metrizamide    Sulfa Antibiotics Other (See Comments)    Reaction: unknown   Ivp Dye [Iodinated Contrast Media] Hives   Latex Rash    REVIEW OF SYSTEMS:   ROS As per history of present illness. All pertinent systems were reviewed above. Constitutional, HEENT, cardiovascular, respiratory, GI, GU, musculoskeletal, neuro, psychiatric, endocrine, integumentary and hematologic systems were reviewed and are otherwise negative/unremarkable except for positive findings mentioned above in the HPI.   MEDICATIONS AT HOME:   Prior to Admission medications   Medication Sig Start Date End Date Taking? Authorizing Provider  clotrimazole-betamethasone (LOTRISONE) cream Apply 1 application. topically daily. 12/07/21   McDonald, Rachelle Hora, DPM  collagenase (SANTYL) ointment Apply topically daily. 09/24/21   Lurene Shadow, MD  Cranberry-Vitamin C-Probiotic (AZO CRANBERRY PO) Take 1 tablet by mouth daily.    [provider]  dicyclomine (BENTYL) 20 MG tablet Take 20 mg by mouth 2 (two) times daily.    [provider]  hydrochlorothiazide (HYDRODIURIL) 25 MG tablet Take 25 mg by mouth daily. 06/19/20   [provider]  Multiple Vitamins-Minerals (CENTRUM MEN PO) Take 1 tablet by mouth daily.     [provider]  pantoprazole (PROTONIX) 40 MG tablet Take 1 tablet (40 mg total) by mouth daily. 04/29/19   Houston Siren, MD  potassium chloride SA (KLOR-CON) 20 MEQ tablet Take 20 mEq by mouth daily.    [provider]  rosuvastatin (CRESTOR) 10 MG tablet Take 10 mg by mouth at bedtime. 10/20/19    [provider]  sucralfate (CARAFATE) 1 g tablet Take 1 g by mouth 3 (three) times daily. 12/14/19   [provider]  traZODone (DESYREL) 50 MG tablet Take 50 mg by mouth at bedtime. 02/15/21   [provider]      VITAL SIGNS:  Blood pressure (!) 91/54, pulse 88, temperature 99.6 F (37.6 C), temperature source Axillary, resp. rate 18, weight 53 kg, SpO2 99%.  PHYSICAL EXAMINATION:  Physical Exam  GENERAL:  57 y.o.-year-old Caucasian male patient lying in the bed with no acute distress.  EYES: Pupils equal, round, reactive to light and accommodation. No scleral icterus. Extraocular muscles intact.  HEENT: Head atraumatic, normocephalic. Oropharynx and nasopharynx clear.  NECK:  Supple, no jugular venous distention. No thyroid enlargement, no tenderness.  LUNGS: Normal breath sounds bilaterally, no wheezing, rales,rhonchi or crepitation. No use of accessory muscles of respiration.  CARDIOVASCULAR: Regular rate and rhythm, S1, S2 normal. No murmurs, rubs, or gallops.  ABDOMEN: Soft,  nondistended, nontender. Bowel sounds present. No organomegaly or mass.  EXTREMITIES: No pedal edema, cyanosis, or clubbing.  NEUROLOGIC: Cranial nerves II through XII are intact. Muscle strength 5/5 in all extremities. Sensation intact. Gait not checked.  PSYCHIATRIC: The patient is alert and oriented x 3.  Normal affect and good eye contact. SKIN: Stage III-IV sacral decubitus ulcer with erythematous base.  LABORATORY PANEL:   CBC Recent Labs  Lab 08/09/23 0359  WBC 9.1  HGB 10.7*  HCT 37.1*  PLT 490*   ------------------------------------------------------------------------------------------------------------------  Chemistries  Recent Labs  Lab 08/08/23 1656 08/09/23 0359  NA 133* 136  K 2.8* 2.7*  CL 97* 102  CO2 24 22  GLUCOSE 61* 127*  BUN 33* 30*  CREATININE 1.57* 1.59*  CALCIUM 9.2 8.6*  AST 28  --   ALT 25  --   ALKPHOS 85  --   BILITOT 0.4  --     ------------------------------------------------------------------------------------------------------------------  Cardiac Enzymes No results for input(s): "TROPONINI" in the last 168 hours. ------------------------------------------------------------------------------------------------------------------  RADIOLOGY:  CT PELVIS WO CONTRAST  Result Date: 08/08/2023 CLINICAL DATA:  Large pelvic ulceration, prior history of osteomyelitis. EXAM: CT PELVIS WITHOUT CONTRAST TECHNIQUE: Multidetector CT imaging of the pelvis was performed following the standard protocol without intravenous contrast. RADIATION DOSE REDUCTION: This exam was performed according to the departmental dose-optimization program which includes automated exposure control, adjustment of the mA and/or kV according to patient size and/or use of iterative reconstruction technique. COMPARISON:  CT pelvis dated 05/12/2023 suspected right mid abdominal FINDINGS: Urinary Tract: Bladder is decompressed. Suspected right lower abdominal ileal conduit. Bowel: Status post left hemicolectomy. Known left mid abdominal colostomy is not imaged. Vascular/Lymphatic: No evidence of aneurysm. No suspicious pelvic lymphadenopathy. 8 mm right common iliac node (series 7/image 22), within normal limits. Reproductive:  Prostatomegaly. Other:  No pelvic ascites. Musculoskeletal: Large right decubitus ulcer (series 7/image 78) with chronic destructive changes involving the right ischial tuberosity and inferior pubic ramus. This appearance favors chronic osteomyelitis, without superimposed acute interval change. Chronic left ischial decubitus ulcer (series 7/image 70) with chronic destructive changes involving the left ischium, without superimposed acute interval change. Associated chronic left hip deformity and effusion. IMPRESSION: Large right decubitus ulcer with sequela of chronic osteomyelitis. Left ischial decubitus ulcer with sequela of chronic  osteomyelitis. Associated chronic left hip deformity and effusion. No superimposed acute inflammatory changes to suggest acute on chronic osteomyelitis. Postsurgical changes, as above, incompletely visualized. Electronically Signed   By: Charline Bills M.D.   On: 08/08/2023 21:46   DG Chest 2 View  Result Date: 08/08/2023 CLINICAL DATA:  Suspected sepsis. EXAM: CHEST - 2 VIEW COMPARISON:  Chest radiograph dated 03/07/2021. FINDINGS: No focal consolidation, pleural effusion, pneumothorax. The cardiac silhouette is within normal limits. No acute osseous pathology. Scoliosis. IMPRESSION: No active cardiopulmonary disease. Electronically Signed   By: Elgie Collard M.D.   On: 08/08/2023 18:07      IMPRESSION AND PLAN:  Assessment and Plan: * Sepsis due to cellulitis [facility]) - This is like secondary to acute lower UTI and cellulitis. - It is manifested by leukocytosis and tachycardia. - The patient will be placed on IV antibiotic therapy with vancomycin and Rocephin. - We will follow blood and urine cultures. - We will follow wound culture. - We will be hydrated with IV lactated ringer.  Acute lower UTI - This is likely the main culprit for his sepsis.. - Will be placed on IV Rocephin and will follow urine culture and sensitivity.  Hypokalemia  Potassium will be replaced and magnesium level will be checked.  Dyslipidemia - We will continue statin therapy.  GERD without esophagitis - We will continue PPI therapy and Carafate.   DVT prophylaxis: Lovenox.  Advanced Care Planning:  Code Status: full code.  Family Communication:  The plan of care was discussed in details with the patient (and family). I answered all questions. The patient agreed to proceed with the above mentioned plan. Further management will depend upon hospital course. Disposition Plan: Back to previous home environment Consults called: None. All the records are reviewed and case discussed with ED provider.  Status  is: Inpatient   At the time of the admission, it appears that the appropriate admission status for this patient is inpatient.  This is judged to be reasonable and necessary in order to provide the required intensity of service to ensure the patient's safety given the presenting symptoms, physical exam findings and initial radiographic and laboratory data in the context of comorbid conditions.  The patient requires inpatient status due to high intensity of service, high risk of further deterioration and high frequency of surveillance required.  I certify that at the time of admission, it is my clinical judgment that the patient will require inpatient hospital care extending more than 2 midnights.                            Dispo: The patient is from: Home              Anticipated d/c is to: Home              Patient currently is not medically stable to d/c.              Difficult to place patient: No  Hannah Beat M.D on 08/09/2023 at 5:14 AM  [facility]   From 7 PM-7 AM, contact night-coverage [email or web address]  CC: Primary care physician; Marguarite Arbour, MD

## 2023-08-08 NOTE — Progress Notes (Signed)
CODE SEPSIS - PHARMACY COMMUNICATION  **Broad Spectrum Antibiotics should be administered within 1 hour of Sepsis diagnosis**  Time Code Sepsis Called/Page Received: 11/4 @ 2117  Antibiotics Ordered: Cefepime, metronidazole, vancomycin  Time of 1st antibiotic administration: 2147  Additional action taken by pharmacy: None  If necessary, Name of Provider/Nurse Contacted: None    Merryl Hacker ,PharmD Clinical Pharmacist  08/08/2023  9:22 PM

## 2023-08-08 NOTE — Progress Notes (Signed)
PHARMACY -  BRIEF ANTIBIOTIC NOTE   Pharmacy has received consult(s) for cefepime and vancomycin from an ED provider.  The patient's profile has been reviewed for ht/wt/allergies/indication/available labs.    One time order(s) placed for cefepime 2 g IV x 1, vancomycin 1 g IV x 1  Further antibiotics/pharmacy consults should be ordered by admitting physician if indicated.                       Thank you, Merryl Hacker, PharmD Clinical Pharmacist 08/08/2023  9:23 PM

## 2023-08-08 NOTE — ED Notes (Signed)
Assisted MD in rolling patient to assess wound status. Picture of wound in chart.

## 2023-08-08 NOTE — ED Provider Notes (Signed)
[facility] Provider Note    Event Date/Time   First MD Initiated Contact with Patient 08/08/23 2043     (approximate)   History   Wound Check   HPI [name] is a 57 y.o. male with history of spina bifida, neurogenic bladder, chronic bilateral hydronephrosis, colostomy, HTN, HLD, GERD, CKD stage III presenting today for increased drainage and worsening appearance of sacral ulcer.  Patient has been followed by wound clinic outpatient.  They have noticed worsening black tissue change to the ulcer along with drainage that is foul-smelling.  Notably worsened in the last several days since the last time they saw wound care.  Patient otherwise denies abdominal pain, nausea, vomiting.  Some chills intermittently.  No fevers.  No other chest pain or shortness of breath.  Chart review: Patient had admission in August of this year for the same ulcer.  There was concern for acute on chronic sacral osteomyelitis.  He was started on broad-spectrum antibiotics and admitted to hospitalist at that time.  No surgical debridement was performed but was on IV antibiotics and eventually discharged.  No recent antibiotics usage.     Physical Exam   Triage Vital Signs: ED Triage Vitals  Encounter Vitals Group     BP 08/08/23 1530 97/65     Systolic BP Percentile --      Diastolic BP Percentile --      Pulse Rate 08/08/23 1530 (!) 108     Resp 08/08/23 1530 20     Temp 08/08/23 1530 98.3 F (36.8 C)     Temp Source 08/08/23 1530 Axillary     SpO2 08/08/23 1530 100 %     Weight --      Height --      Head Circumference --      Peak Flow --      Pain Score 08/08/23 1529 0     Pain Loc --      Pain Education --      Exclude from Growth Chart --     Most recent vital signs: Vitals:   08/08/23 2145 08/08/23 2230  BP: 112/65 93/60  Pulse: 96 87  Resp: 18 17  Temp:  99.6 F (37.6 C)  SpO2: 99% 100%   Physical Exam: I have reviewed the vital signs and nursing  notes. General: Awake, alert, no acute distress.  Nontoxic appearing. Head:  Atraumatic, normocephalic.   ENT:  EOM intact, PERRL. Oral mucosa is pink and moist with no lesions. Neck: Neck is supple with full range of motion, No meningeal signs. Cardiovascular:  RRR, No murmurs. Peripheral pulses palpable and equal bilaterally. Respiratory:  Symmetrical chest wall expansion.  No rhonchi, rales, or wheezes.  Good air movement throughout.  No use of accessory muscles.   Musculoskeletal:  No cyanosis or edema. Moving extremities with full ROM Abdomen:  Soft, nontender, nondistended.  Colostomy bag and urostomy bag in place. Neuro:  GCS 15, moving all four extremities, interacting appropriately. Speech clear. Psych:  Calm, appropriate.   Skin: Large sacral ulcer noted to the right side of the posterior pelvis.  Black eschar noted in the area.  Mild drainage to the area.  No obvious bleeding.  See media tab for photos.   ED Results / Procedures / Treatments   Labs (all labs ordered are listed, but only abnormal results are displayed) Labs Reviewed  COMPREHENSIVE METABOLIC PANEL - Abnormal; Notable for the following components:      Result Value  Sodium 133 (*)    Potassium 2.8 (*)    Chloride 97 (*)    Glucose, Bld 61 (*)    BUN 33 (*)    Creatinine, Ser 1.57 (*)    Total Protein 8.5 (*)    Albumin 3.1 (*)    GFR, Estimated 51 (*)    All other components within normal limits  LACTIC ACID, PLASMA - Abnormal; Notable for the following components:   Lactic Acid, Venous 2.0 (*)    All other components within normal limits  LACTIC ACID, PLASMA - Abnormal; Notable for the following components:   Lactic Acid, Venous 2.4 (*)    All other components within normal limits  CBC WITH DIFFERENTIAL/PLATELET - Abnormal; Notable for the following components:   WBC 13.6 (*)    Hemoglobin 12.6 (*)    MCV 78.8 (*)    MCH 23.9 (*)    RDW 18.1 (*)    Platelets 499 (*)    Neutro Abs 11.0 (*)    All  other components within normal limits  URINALYSIS, W/ REFLEX TO CULTURE (INFECTION SUSPECTED) - Abnormal; Notable for the following components:   Color, Urine YELLOW (*)    APPearance CLOUDY (*)    Hgb urine dipstick SMALL (*)    Protein, ur 30 (*)    Nitrite POSITIVE (*)    Leukocytes,Ua LARGE (*)    Bacteria, UA RARE (*)    All other components within normal limits  CULTURE, BLOOD (ROUTINE X 2)  CULTURE, BLOOD (ROUTINE X 2)  URINE CULTURE  PROTIME-INR     EKG    RADIOLOGY Independently interpreted CT pelvis and chest x-ray with no acute pathology   PROCEDURES:  Critical Care performed: Yes, see critical care procedure note(s)  .Critical Care  Performed by: Janith Lima, MD Authorized by: Janith Lima, MD   Critical care provider statement:    Critical care time (minutes):  30   Critical care was necessary to treat or prevent imminent or life-threatening deterioration of the following conditions:  Sepsis   Critical care was time spent personally by me on the following activities:  Development of treatment plan with patient or surrogate, discussions with consultants, evaluation of patient's response to treatment, examination of patient, ordering and review of laboratory studies, ordering and review of radiographic studies, ordering and performing treatments and interventions, pulse oximetry, re-evaluation of patient's condition and review of old charts    MEDICATIONS ORDERED IN ED: Medications  sodium chloride flush (NS) 0.9 % injection 10 mL (10 mLs Intravenous Not Given 08/08/23 2142)  vancomycin (VANCOCIN) IVPB 1000 mg/200 mL premix (1,000 mg Intravenous New Bag/Given 08/08/23 2326)  ceFEPIme (MAXIPIME) 2 g in sodium chloride 0.9 % 100 mL IVPB (0 g Intravenous Stopped 08/08/23 2327)  metroNIDAZOLE (FLAGYL) IVPB 500 mg (0 mg Intravenous Stopped 08/08/23 2305)  lactated ringers bolus 1,000 mL (0 mLs Intravenous Stopped 08/08/23 2242)  lactated ringers bolus 1,000 mL  (1,000 mLs Intravenous New Bag/Given 08/08/23 2321)     IMPRESSION / MDM / ASSESSMENT AND PLAN / ED COURSE  I reviewed the triage vital signs and the nursing notes.                              Differential diagnosis includes, but is not limited to, osteomyelitis, sacral wound infection, acute cystitis, sepsis  Patient's presentation is most consistent with acute presentation with potential threat to life or bodily function.  Patient is a 57 year old male presenting today for concern of infection to sacral ulcer.  On arrival.  Patient is mildly tachycardic and hypotensive.  Although, he does report hypotension is comparable to his baseline.  Laboratory workup initially notable for a lactic acid of 2.0.  White blood cell count elevated at 13.6 concerning for infection.  There is some black eschar noted to the sacral ulcer which appears worse than recent admission but no other drainage from the site.  CT pelvis ordered to evaluate for concerns of osteomyelitis.  This was negative for any acute pathology.  Separately, did test patient's urine which was grossly positive for a UTI.  Concern for sepsis secondary to UTI and possibly the sacral ulcer.  Blood cultures were drawn and patient started on broad-spectrum antibiotics.  Uptrending lactic at 2.4 and patient was given additional liter of fluids.  Plan to admit to hospitalist for ongoing treatment.  The patient is on the cardiac monitor to evaluate for evidence of arrhythmia and/or significant heart rate changes. Clinical Course as of 08/08/23 2330  Mon Aug 08, 2023  2307 Lactic Acid, Venous(!!): 2.4 Worsening.  Will give additional liter of fluid at this time [DW]    Clinical Course User Index [DW] Janith Lima, MD     FINAL CLINICAL IMPRESSION(S) / ED DIAGNOSES   Final diagnoses:  Sepsis, due to unspecified organism, unspecified whether acute organ dysfunction present [facility])  Acute cystitis with hematuria  Wound of sacral region,  initial encounter     Rx / DC Orders   ED Discharge Orders     None        Note:  This document was prepared using Dragon voice recognition software and may include unintentional dictation errors.   Janith Lima, MD 08/08/23 [phone]

## 2023-08-08 NOTE — ED Notes (Signed)
MD Jessup informed of lactic of 2.2

## 2023-08-08 NOTE — ED Triage Notes (Signed)
Pt comes via EMS from home with c/o ulcer on sacrum. Family states it is getting bigger. Pt is under wound care but wanted him to come here  98/60 per pt is normal 97% RA

## 2023-08-08 NOTE — ED Notes (Signed)
Pt resting at this time.

## 2023-08-09 ENCOUNTER — Encounter: Payer: Self-pay | Admitting: Family Medicine

## 2023-08-09 DIAGNOSIS — A419 Sepsis, unspecified organism: Secondary | ICD-10-CM | POA: Diagnosis not present

## 2023-08-09 DIAGNOSIS — E785 Hyperlipidemia, unspecified: Secondary | ICD-10-CM | POA: Diagnosis not present

## 2023-08-09 DIAGNOSIS — E876 Hypokalemia: Secondary | ICD-10-CM | POA: Diagnosis not present

## 2023-08-09 DIAGNOSIS — S31000A Unspecified open wound of lower back and pelvis without penetration into retroperitoneum, initial encounter: Secondary | ICD-10-CM

## 2023-08-09 DIAGNOSIS — N390 Urinary tract infection, site not specified: Secondary | ICD-10-CM | POA: Diagnosis not present

## 2023-08-09 DIAGNOSIS — M8660 Other chronic osteomyelitis, unspecified site: Secondary | ICD-10-CM

## 2023-08-09 DIAGNOSIS — K219 Gastro-esophageal reflux disease without esophagitis: Secondary | ICD-10-CM | POA: Insufficient documentation

## 2023-08-09 LAB — HIV ANTIBODY (ROUTINE TESTING W REFLEX): HIV Screen 4th Generation wRfx: NONREACTIVE

## 2023-08-09 LAB — BASIC METABOLIC PANEL
Anion gap: 12 (ref 5–15)
BUN: 30 mg/dL — ABNORMAL HIGH (ref 6–20)
CO2: 22 mmol/L (ref 22–32)
Calcium: 8.6 mg/dL — ABNORMAL LOW (ref 8.9–10.3)
Chloride: 102 mmol/L (ref 98–111)
Creatinine, Ser: 1.59 mg/dL — ABNORMAL HIGH (ref 0.61–1.24)
GFR, Estimated: 51 mL/min — ABNORMAL LOW (ref >60)
Glucose, Bld: 127 mg/dL — ABNORMAL HIGH (ref 70–99)
Potassium: 2.7 mmol/L — CL (ref 3.5–5.1)
Sodium: 136 mmol/L (ref 135–145)

## 2023-08-09 LAB — PROCALCITONIN: Procalcitonin: 0.21 ng/mL

## 2023-08-09 LAB — CBC
HCT: 37.1 % — ABNORMAL LOW (ref 39.0–52.0)
Hemoglobin: 10.7 g/dL — ABNORMAL LOW (ref 13.0–17.0)
MCH: 23.3 pg — ABNORMAL LOW (ref 26.0–34.0)
MCHC: 28.8 g/dL — ABNORMAL LOW (ref 30.0–36.0)
MCV: 80.7 fL (ref 80.0–100.0)
Platelets: 490 10*3/uL — ABNORMAL HIGH (ref 150–400)
RBC: 4.6 MIL/uL (ref 4.22–5.81)
RDW: 18.6 % — ABNORMAL HIGH (ref 11.5–15.5)
WBC: 9.1 10*3/uL (ref 4.0–10.5)
nRBC: 0 % (ref 0.0–0.2)

## 2023-08-09 LAB — PROTIME-INR
INR: 1.1 (ref 0.8–1.2)
Prothrombin Time: 13.9 s (ref 11.4–15.2)

## 2023-08-09 LAB — MAGNESIUM: Magnesium: 2.3 mg/dL (ref 1.7–2.4)

## 2023-08-09 LAB — LACTIC ACID, PLASMA: Lactic Acid, Venous: 1.5 mmol/L (ref 0.5–1.9)

## 2023-08-09 MED ORDER — ACETAMINOPHEN 325 MG PO TABS: 650.0000 mg | ORAL_TABLET | Freq: Four times a day (QID) | ORAL | Status: DC | PRN

## 2023-08-09 MED ORDER — POTASSIUM CHLORIDE 20 MEQ PO PACK
40.0000 meq | PACK | Freq: Once | ORAL | Status: AC
  Administered 2023-08-09: 40 meq via ORAL
  Filled 2023-08-09: qty 2

## 2023-08-09 MED ORDER — ENOXAPARIN SODIUM 40 MG/0.4ML IJ SOSY
40.0000 mg | PREFILLED_SYRINGE | INTRAMUSCULAR | Status: DC
  Administered 2023-08-09 – 2023-08-12 (×4): 40 mg via SUBCUTANEOUS
  Filled 2023-08-09 (×4): qty 0.4

## 2023-08-09 MED ORDER — PANTOPRAZOLE SODIUM 40 MG PO TBEC
40.0000 mg | DELAYED_RELEASE_TABLET | Freq: Every day | ORAL | Status: DC
  Administered 2023-08-09 – 2023-08-12 (×4): 40 mg via ORAL
  Filled 2023-08-09 (×4): qty 1

## 2023-08-09 MED ORDER — LACTATED RINGERS IV SOLN
150.0000 mL/h | INTRAVENOUS | Status: AC
  Administered 2023-08-09 (×2): 150 mL/h via INTRAVENOUS

## 2023-08-09 MED ORDER — SUCRALFATE 1 G PO TABS
1.0000 g | ORAL_TABLET | Freq: Three times a day (TID) | ORAL | Status: DC
  Administered 2023-08-09 – 2023-08-12 (×10): 1 g via ORAL
  Filled 2023-08-09 (×10): qty 1

## 2023-08-09 MED ORDER — POTASSIUM CHLORIDE CRYS ER 20 MEQ PO TBCR
20.0000 meq | EXTENDED_RELEASE_TABLET | Freq: Every day | ORAL | Status: DC
  Administered 2023-08-10: 20 meq via ORAL
  Filled 2023-08-09 (×2): qty 1

## 2023-08-09 MED ORDER — TRAZODONE HCL 50 MG PO TABS: 25.0000 mg | ORAL_TABLET | Freq: Every evening | ORAL | Status: DC | PRN

## 2023-08-09 MED ORDER — SODIUM CHLORIDE 0.9 % IV SOLN
2.0000 g | INTRAVENOUS | Status: DC
  Administered 2023-08-09 – 2023-08-12 (×4): 2 g via INTRAVENOUS
  Filled 2023-08-09 (×4): qty 20

## 2023-08-09 MED ORDER — MAGNESIUM HYDROXIDE 400 MG/5ML PO SUSP: 30.0000 mL | Freq: Every day | ORAL | Status: DC | PRN

## 2023-08-09 MED ORDER — POTASSIUM CHLORIDE CRYS ER 20 MEQ PO TBCR
40.0000 meq | EXTENDED_RELEASE_TABLET | Freq: Once | ORAL | Status: AC
  Administered 2023-08-09: 40 meq via ORAL
  Filled 2023-08-09: qty 2

## 2023-08-09 MED ORDER — DICYCLOMINE HCL 20 MG PO TABS
20.0000 mg | ORAL_TABLET | Freq: Two times a day (BID) | ORAL | Status: DC
  Administered 2023-08-09 – 2023-08-12 (×7): 20 mg via ORAL
  Filled 2023-08-09 (×8): qty 1

## 2023-08-09 MED ORDER — ACETAMINOPHEN 650 MG RE SUPP: 650.0000 mg | Freq: Four times a day (QID) | RECTAL | Status: DC | PRN

## 2023-08-09 MED ORDER — ONDANSETRON HCL 4 MG PO TABS: 4.0000 mg | ORAL_TABLET | Freq: Four times a day (QID) | ORAL | Status: DC | PRN

## 2023-08-09 MED ORDER — ONDANSETRON HCL 4 MG/2ML IJ SOLN: 4.0000 mg | Freq: Four times a day (QID) | INTRAMUSCULAR | Status: DC | PRN

## 2023-08-09 MED ORDER — QUETIAPINE FUMARATE 25 MG PO TABS
25.0000 mg | ORAL_TABLET | Freq: Every day | ORAL | Status: DC
Start: 2023-08-09 — End: 2023-08-12
  Administered 2023-08-09 – 2023-08-11 (×3): 25 mg via ORAL
  Filled 2023-08-09 (×3): qty 1

## 2023-08-09 MED ORDER — ROSUVASTATIN CALCIUM 10 MG PO TABS
10.0000 mg | ORAL_TABLET | Freq: Every day | ORAL | Status: DC
  Administered 2023-08-09 – 2023-08-11 (×3): 10 mg via ORAL
  Filled 2023-08-09 (×6): qty 1

## 2023-08-09 MED ORDER — VANCOMYCIN HCL IN DEXTROSE 1-5 GM/200ML-% IV SOLN
1000.0000 mg | INTRAVENOUS | Status: DC
Start: 2023-08-10 — End: 2023-08-10

## 2023-08-09 MED ORDER — POTASSIUM CHLORIDE 20 MEQ PO PACK
40.0000 meq | PACK | Freq: Once | ORAL | Status: AC
Start: 2023-08-09 — End: 2023-08-09
  Administered 2023-08-09: 40 meq via ORAL
  Filled 2023-08-09: qty 2

## 2023-08-09 MED ORDER — COLLAGENASE 250 UNIT/GM EX OINT: TOPICAL_OINTMENT | Freq: Every day | CUTANEOUS | Status: DC

## 2023-08-09 MED ORDER — TRAZODONE HCL 50 MG PO TABS
50.0000 mg | ORAL_TABLET | Freq: Every day | ORAL | Status: DC
  Administered 2023-08-09 – 2023-08-11 (×3): 50 mg via ORAL
  Filled 2023-08-09 (×3): qty 1

## 2023-08-09 MED ORDER — AZO CRANBERRY 250-30 MG PO TABS: ORAL_TABLET | Freq: Every day | ORAL | Status: DC

## 2023-08-09 MED ORDER — ADULT MULTIVITAMIN W/MINERALS CH
1.0000 | ORAL_TABLET | Freq: Every day | ORAL | Status: DC
Start: 2023-08-09 — End: 2023-08-12
  Administered 2023-08-09 – 2023-08-12 (×4): 1 via ORAL
  Filled 2023-08-09 (×4): qty 1

## 2023-08-09 MED ORDER — VANCOMYCIN HCL IN DEXTROSE 1-5 GM/200ML-% IV SOLN: 1000.0000 mg | Freq: Once | INTRAVENOUS | Status: DC

## 2023-08-09 NOTE — Assessment & Plan Note (Addendum)
Patient met sepsis criteria with leukocytosis and tachycardia. Large decubitus ulcers with concern of infection.  Imaging with chronic osteomyelitis, no concern of acute on chronic. Prior wound cultures with Klebsiella on most recent done in August 2024, different organisms in the past.  Also concern of UTI. Preliminary blood cultures negative, urine cultures pending. General surgery was also consulted to see if he needed wound debridement. -Wound care consult -Continue with Rocephin and vancomycin -Follow-up cultures -Follow-up surgery recommendations

## 2023-08-09 NOTE — Progress Notes (Signed)
PHARMACIST - PHYSICIAN ORDER COMMUNICATION  CONCERNING: P&T Medication Policy on Herbal Medications  DESCRIPTION:  This patient's order for:  Azo-Cranberry tabs  has been noted.  This product(s) is classified as an "herbal" or natural product. Due to a lack of definitive safety studies or FDA approval, nonstandard manufacturing practices, plus the potential risk of unknown drug-drug interactions while on inpatient medications, the Pharmacy and Therapeutics Committee does not permit the use of "herbal" or natural products of this type within [facility].   ACTION TAKEN: The pharmacy department is unable to verify this order at this time and your patient has been informed of this safety policy. Please reevaluate patient's clinical condition at discharge and address if the herbal or natural product(s) should be resumed at that time.

## 2023-08-09 NOTE — Consult Note (Addendum)
Patient ID: [name], male   DOB: Nov 27, 1965, 57 y.o.   MRN: [id number]  HPI [name] is a 57 y.o. male seen in consultation at the request of Dr Nelson Chimes [name] is a 57 y.o. Caucasian male with medical history significant for hypertension, dyslipidemia, urolithiasis, spina bifida and neurogenic bladder, sacral decubitus ulcer, who presented to the emergency room with acute onset of worsening pain from his sacral decubitus ulcer.  It was noted by his sister that it was getting bigger.  Patient has a urostomy and a colostomy.  He noticed his urine was smelling offensive recently.  He denied any fever or chills.  No chest pain or palpitations or cough or wheezing. I suspect sister not necessarily aware or comfortable w wound care. He has been seen at the wound care center and actually they have noticed a significant improvement over the last few weeks a BUN of 83 with a creatinine of 1.57 with blood glucose of 61 and albumin 3.1 with troponin 8.5. Lactic acid was 2 and later 2.4. CBC showed leukocytosis 13.6 with neutrophilia as well as microcytosis and thrombocytosis  CT per. Reviewed  Large right decubitus ulcer with sequela of chronic osteomyelitis.  [name] saw him several months ago and that time he did not require any debridements HPI  Past Medical History:  Diagnosis Date   Abdominal distention 10/11/2016   Acute lower UTI 12/22/2019   Acute sepsis (HCC) 04/21/2016   AKI (acute kidney injury) (HCC) 06/27/2020   Decubitus ulcer    Distal intestinal obstruction syndrome (HCC) 10/11/2016   Fecal impaction in rectum (HCC) 10/10/2016   Gastric outlet obstruction 06/03/2016   Gastritis    GERD (gastroesophageal reflux disease) 12/22/2019   HTN (hypertension) 06/27/2020   Hydronephrosis    Hyperlipidemia    Hypokalemia 10/11/2016   Impaction of the bowels (HCC)    Kidney stone    Left ureteral stone 06/27/2020   Leukocytosis 10/11/2016   Loose stools 10/11/2016   Nausea &  vomiting 06/03/2016   Renal disorder    SBO (small bowel obstruction) (HCC) 11/02/2017   Sepsis due to gram-negative UTI (HCC) 03/08/2021   Septic shock (HCC) 03/08/2021   SIRS (systemic inflammatory response syndrome) (HCC) 06/03/2016   Spina bifida (HCC)    Ulcer of esophagus with bleeding     Past Surgical History:  Procedure Laterality Date   ABDOMINAL SURGERY     APPENDECTOMY     BACK SURGERY     COLON SURGERY     CREATION / REVISION OF ILEOSTOMY / JEJUNOSTOMY  05/2015   CYSTOSCOPY WITH RETROGRADE PYELOGRAM, URETEROSCOPY AND STENT PLACEMENT  03/2014   ESOPHAGOGASTRODUODENOSCOPY (EGD) WITH PROPOFOL N/A 06/03/2016   Procedure: ESOPHAGOGASTRODUODENOSCOPY (EGD) WITH PROPOFOL;  Surgeon: Midge Minium, MD;  Location: [facility] ENDOSCOPY;  Service: Endoscopy;  Laterality: N/A;   ESOPHAGOGASTRODUODENOSCOPY (EGD) WITH PROPOFOL N/A 04/27/2019   Procedure: ESOPHAGOGASTRODUODENOSCOPY (EGD) WITH PROPOFOL;  Surgeon: Wyline Mood, MD;  Location: [facility] ENDOSCOPY;  Service: Gastroenterology;  Laterality: N/A;   ilieostomy     PERCUTANEOUS NEPHROSTOLITHOTOMY     PERCUTANEOUS NEPHROSTOMY     VENTRICULOPERITONEAL SHUNT     vp shunt removal      Family History  Problem Relation Age of Onset   Diabetes Mellitus II Father    Bladder Cancer Neg Hx    Kidney cancer Neg Hx    Prostate cancer Neg Hx     Social History Social History   Tobacco Use   Smoking status: Never   Smokeless  tobacco: Never  Substance Use Topics   Alcohol use: No   Drug use: No    Allergies  Allergen Reactions   Morphine And Codeine Nausea And Vomiting   Metrizamide    Sulfa Antibiotics Other (See Comments)    Reaction: unknown   Ivp Dye [Iodinated Contrast Media] Hives   Latex Rash    Current Facility-Administered Medications  Medication Dose Route Frequency Provider Last Rate Last Admin   acetaminophen (TYLENOL) tablet 650 mg  650 mg Oral Q6H PRN Mansy, Jan A, MD       Or   acetaminophen (TYLENOL) suppository 650 mg   650 mg Rectal Q6H PRN Mansy, Jan A, MD       cefTRIAXone (ROCEPHIN) 2 g in sodium chloride 0.9 % 100 mL IVPB  2 g Intravenous Q24H Mansy, Jan A, MD   Stopped at 08/09/23 0811   dicyclomine (BENTYL) tablet 20 mg  20 mg Oral BID Mansy, Jan A, MD   20 mg at 08/09/23 0943   enoxaparin (LOVENOX) injection 40 mg  40 mg Subcutaneous Q24H Mansy, Jan A, MD   40 mg at 08/09/23 0749   lactated ringers infusion  150 mL/hr Intravenous Continuous Mansy, Jan A, MD 150 mL/hr at 08/09/23 0737 Infusion Verify at 08/09/23 0737   magnesium hydroxide (MILK OF MAGNESIA) suspension 30 mL  30 mL Oral Daily PRN Mansy, Jan A, MD       multivitamin with minerals tablet 1 tablet  1 tablet Oral Daily Mansy, Jan A, MD   1 tablet at 08/09/23 0942   ondansetron (ZOFRAN) tablet 4 mg  4 mg Oral Q6H PRN Mansy, Jan A, MD       Or   ondansetron [facility]) injection 4 mg  4 mg Intravenous Q6H PRN Mansy, Jan A, MD       pantoprazole (PROTONIX) EC tablet 40 mg  40 mg Oral Daily Mansy, Jan A, MD   40 mg at 08/09/23 1610   potassium chloride SA (KLOR-CON M) CR tablet 20 mEq  20 mEq Oral Daily Mansy, Jan A, MD       QUEtiapine (SEROQUEL) tablet 25 mg  25 mg Oral QHS Arnetha Courser, MD       rosuvastatin (CRESTOR) tablet 10 mg  10 mg Oral QHS Mansy, Jan A, MD       sodium chloride flush (NS) 0.9 % injection 10 mL  10 mL Intravenous Q12H Janith Lima, MD       sucralfate (CARAFATE) tablet 1 g  1 g Oral TID Mansy, Jan A, MD   1 g at 08/09/23 9604   traZODone (DESYREL) tablet 50 mg  50 mg Oral QHS Mansy, Jan A, MD       [START ON 08/10/2023] vancomycin (VANCOCIN) IVPB 1000 mg/200 mL premix  1,000 mg Intravenous Q48H Mansy, Jan A, MD       Current Outpatient Medications  Medication Sig Dispense Refill   Cranberry-Vitamin C-Probiotic (AZO CRANBERRY PO) Take 1 tablet by mouth daily.     dicyclomine (BENTYL) 20 MG tablet Take 20 mg by mouth 2 (two) times daily.     hydrochlorothiazide (HYDRODIURIL) 25 MG tablet Take 25 mg by mouth daily.      Multiple Vitamins-Minerals (CENTRUM MEN PO) Take 1 tablet by mouth daily.      pantoprazole (PROTONIX) 40 MG tablet Take 1 tablet (40 mg total) by mouth daily. 30 tablet 1   potassium chloride SA (KLOR-CON) 20 MEQ tablet Take 20 mEq by mouth  3 (three) times daily.     QUEtiapine (SEROQUEL) 25 MG tablet Take 25 mg by mouth at bedtime.     rosuvastatin (CRESTOR) 10 MG tablet Take 10 mg by mouth at bedtime.     sucralfate (CARAFATE) 1 g tablet Take 1 g by mouth 3 (three) times daily.     tiZANidine (ZANAFLEX) 4 MG tablet Take 4 mg by mouth every 8 (eight) hours as needed for muscle spasms.     clotrimazole-betamethasone (LOTRISONE) cream Apply 1 application. topically daily. (Patient not taking: Reported on 08/09/2023) 30 g 0   collagenase (SANTYL) ointment Apply topically daily. (Patient not taking: Reported on 08/09/2023) 15 g 0   traZODone (DESYREL) 50 MG tablet Take 50 mg by mouth at bedtime. (Patient not taking: Reported on 08/09/2023)       Review of Systems Full ROS  was asked and was negative except for the information on the HPI  Physical Exam Blood pressure (!) 92/56, pulse 74, temperature 98.4 F (36.9 C), temperature source Axillary, resp. rate (!) 26, height 4\' 6"  (1.372 m), weight 53 kg, SpO2 95%. CONSTITUTIONAL: chronically ill and debilitated in NAD. EYES: Pupils are equal, round, Sclera are non-icteric. EARS, NOSE, MOUTH AND THROAT:  The oral mucosa is pink and moist. Hearing is intact to voice. LYMPH NODES:  Lymph nodes in the neck are normal. RESPIRATORY:  Lungs are clear. There is normal respiratory effort, with equal breath sounds bilaterally, and without pathologic use of accessory muscles. CARDIOVASCULAR: Heart is regular without murmurs, gallops, or rubs. GI: The abdomen is  soft, nontender, and nondistended. There are no palpable masses. There is no hepatosplenomegaly. There are normal bowel sounds. Ostomy and urostomy in place GU: Rectal deferred.   MUSCULOSKELETAL:  Normal muscle strength and tone. No cyanosis or edema.   SKIN: There is evidence of sacral decubitus extending to the right ischial tuberosity. It measures 13 x 3 cms. Overall good granulation tissue, on medial side some skin  Superficial necrosis. Wound w Some tunneling medially, dressing changed. No evidence of necrotizing infection NEUROLOGIC:no acute focal deficits, able to move upper extremities,  PSYCH:  Oriented to person, place and time. Affect is normal.   Media Information  Document Information  Photos  Decubitus ulcer  08/09/2023 13:15  Attached To:  Hospital Encounter on 08/08/23  Source Information  [name], [place], MD  [facility]Emergency [facility]  Document History    Data Reviewed  I have personally reviewed the patient's imaging, laboratory findings and medical records.    Assessment/Plan 57 year old male with chronic decubitus ulcers and chronic osteomyelitis..  From a surgical perspective the wound is healing well and there are no need for any debridement or intervention at this time.  Recommend improvement of nutritional status, pressure prevention measures, improvement of modifiable medical condition and risk factors. May continue w local wound care He may need to follow-up at the wound center. Note that I spent 75 minutes in this encounter including extensive review of medical records, personally reviewing imaging studies, coordinating his care, placing orders and performing documentation    Sterling Big, MD FACS General Surgeon 08/09/2023, 12:42 PM

## 2023-08-09 NOTE — Consult Note (Addendum)
WOC Nurse Consult Note: Reason for Consult: Consult requested for sacrum wound which extends to the ischum.  Performed remotely after review of progress notes and photo in the EMR.  Chronic Stage 4 pressure injury has been followed as an outpatient by the wound care center, recently on 10/31.  Progress notes stated, "Upon evaluation today patient's wound actually appears to be doing better than it has for quite some time. I am actually seeing some improvement which is good news and in general I do believe that we will make an some headway here. I do not see any signs of obvious infection there is also no need for obvious debridement today. Overall I think that we are on the right track." Pressure Injury POA: Yes Apparently, the wound has declined in the past several days, and now has approx 20% eschar, 80% red. CT scan indicates osteomyelitis.This complex medical condition is beyond the scope of practice for WOC nurses.  Please refer to infectious disease and request a surgical consult for further plan of care. Secure chat message sent to the primary team to inform them.  Air mattress ordered to decrease pressure. Topical treatment orders provided for bedside nurses to perform as was the previous plan of care ordered by the outpatient wound care center to provide antimicrobial benefits and absorb drainage: Apply Aquacel [place] # [phone]) to sacrum/ischium wound Q day, then cover with ABD pad and tape Please re-consult if further assistance is needed.  Thank-you,  Cammie Mcgee MSN, RN, CWOCN, [place], CNS [phone]

## 2023-08-09 NOTE — Assessment & Plan Note (Signed)
-   We will continue statin therapy. 

## 2023-08-09 NOTE — ED Notes (Signed)
Sanjit Mcmichael NT II took over tele Monitoring

## 2023-08-09 NOTE — Progress Notes (Addendum)
Progress Note   Patient: [name] ZOX:[id number] DOB: 17-Jan-1966 DOA: 08/08/2023     1 DOS: the patient was seen and examined on 08/09/2023   Brief hospital course: Taken from H&P.  Harutyun Monteverde is a 57 y.o. Caucasian male with medical history significant for hypertension, dyslipidemia, urolithiasis, spina bifida and neurogenic bladder, sacral decubitus ulcer, who presented to the emergency room with acute onset of worsening pain from his sacral decubitus ulcer.  It was noted by his sister that it was getting bigger.  Patient has a urostomy and a colostomy.   On presentation he was mildly tachycardic at 108, otherwise normal vital, labs with hypokalemia, hyponatremia, hypochloremia, BUN of 83, creatinine 1.57, blood glucose 61, albumin 3.1 and troponin 8.5.  Leukocytosis at 13.6.  Lactic acid 2 >>2.4.  UA concerning for UTI.  Blood and urine cultures were sent.  Pelvic CT with large right decubitus ulcer vesicular of chronic osteomyelitis, left ischial decubitus ulcer vesicular of chronic osteomyelitis.  Chronic left hip deformity and effusion.  No superimposed acute inflammatory changes to suggest acute on chronic osteomyelitis.  Patient received IV fluid bolus with 2 L of LR.  Started on Rocephin and vancomycin.  11/5: Vital stable, leukocytosis resolved, all cell line decreased, mild hyponatremia resolved, potassium remained low at 2.7, magnesium normal, renal function seems stable around his baseline of CKD stage IIIa.  Procalcitonin of 0.21.  Preliminary blood cultures negative in 12-hour, urine cultures pending. General surgery was consulted to see if he need any debridement, no debridement needed at this time per surgery.  Assessment and Plan: * Sepsis [facility]) Patient met sepsis criteria with leukocytosis and tachycardia. Large decubitus ulcers with concern of infection.  Imaging with chronic osteomyelitis, no concern of acute on chronic. Prior wound cultures with Klebsiella  on most recent done in August 2024, different organisms in the past.  Also concern of UTI. Preliminary blood cultures negative, urine cultures pending. General surgery was also consulted to see if he needed wound debridement. -Wound care consult -Continue with Rocephin and vancomycin -Follow-up cultures -Follow-up surgery recommendations  Acute lower UTI Patient has urostomy in place.  UA looked infected and urine cultures are pending. -Continue with ceftriaxone and vancomycin for now  Hypokalemia Persistent hypokalemia with potassium at 2.7 with normal magnesium this morning. -Replace potassium and monitor  Dyslipidemia - We will continue statin therapy.  GERD without esophagitis - We will continue PPI therapy and Carafate.        Subjective: Patient was seen and examined today.  He was little upset that no one is changing his bags and they are full.  Denies any pain.  Physical Exam: Vitals:   08/09/23 0737 08/09/23 0744 08/09/23 0800 08/09/23 1330  BP: (!) 92/56   100/65  Pulse: 74   79  Resp: (!) 26   18  Temp:  98.4 F (36.9 C)  98.1 F (36.7 C)  TempSrc:  Axillary  Axillary  SpO2: 95%   99%  Weight:      Height:   4\' 6"  (1.372 m)    General.  Underdeveloped lower extremities, bedbound gentleman, in no acute distress. Pulmonary.  Lungs clear bilaterally, normal respiratory effort. CV.  Regular rate and rhythm, no JVD, rub or murmur. Abdomen.  Soft, nontender, nondistended, BS positive.  Ileostomy bag in place and full CNS.  Alert and oriented .  No focal neurologic deficit. Extremities.  No edema, no cyanosis, pulses intact and symmetrical. Psychiatry.  Judgment and insight appears normal.  Data Reviewed: Prior data reviewed  Family Communication: Talked with sister on phone.  Disposition: Status is: Inpatient Remains inpatient appropriate because: Severity of illness  Planned Discharge Destination: Home with Home Health  DVT prophylaxis.   Lovenox Time spent: 50 minutes  This record has been created using Conservation officer, historic buildings. Errors have been sought and corrected,but may not always be located. Such creation errors do not reflect on the standard of care.   Author: Arnetha Courser, MD 08/09/2023 2:27 PM  For on call review [email or web address].

## 2023-08-09 NOTE — Assessment & Plan Note (Addendum)
-   We will continue PPI therapy and Carafate. 

## 2023-08-09 NOTE — ED Notes (Signed)
Pt not on tele monitoring

## 2023-08-09 NOTE — Sepsis Progress Note (Signed)
Elink following for sepsis protocol. 

## 2023-08-09 NOTE — Hospital Course (Addendum)
Taken from H&P.  [name] is a 57 y.o. Caucasian male with medical history significant for hypertension, dyslipidemia, urolithiasis, spina bifida and neurogenic bladder, sacral decubitus ulcer, who presented to the emergency room with acute onset of worsening pain from his sacral decubitus ulcer.  It was noted by his sister that it was getting bigger.  Patient has a urostomy and a colostomy.   On presentation he was mildly tachycardic at 108, otherwise normal vital, labs with hypokalemia, hyponatremia, hypochloremia, BUN of 83, creatinine 1.57, blood glucose 61, albumin 3.1 and troponin 8.5.  Leukocytosis at 13.6.  Lactic acid 2 >>2.4.  UA concerning for UTI.  Blood and urine cultures were sent.  Pelvic CT with large right decubitus ulcer vesicular of chronic osteomyelitis, left ischial decubitus ulcer vesicular of chronic osteomyelitis.  Chronic left hip deformity and effusion.  No superimposed acute inflammatory changes to suggest acute on chronic osteomyelitis.  Patient received IV fluid bolus with 2 L of LR.  Started on Rocephin and vancomycin.  11/5: Vital stable, leukocytosis resolved, all cell line decreased, mild hyponatremia resolved, potassium remained low at 2.7, magnesium normal, renal function seems stable around his baseline of CKD stage IIIa.  Procalcitonin of 0.21.  Preliminary blood cultures negative in 12-hour, urine cultures pending. General surgery was consulted to see if he need any debridement, no debridement needed at this time per surgery.

## 2023-08-09 NOTE — Assessment & Plan Note (Addendum)
Patient has urostomy in place.  UA looked infected and urine cultures are pending. -Continue with ceftriaxone and vancomycin for now

## 2023-08-09 NOTE — Assessment & Plan Note (Addendum)
Persistent hypokalemia with potassium at 2.7 with normal magnesium this morning. -Replace potassium and monitor

## 2023-08-09 NOTE — Progress Notes (Signed)
Pharmacy Antibiotic Note  [name] is a 57 y.o. male admitted on 08/08/2023 with sepsis.  Pharmacy has been consulted for Vancomycin dosing.  Plan: Vancomycin 1 gm IV X 1 given in ED on 11/04 @ 2326. Vancomycin 1 gm IV Q48H ordered to start on 11/06 @ 2300.  AUC = 490.2 Vanc trough = 10.3   Weight: 53 kg (116 lb 13.5 oz)  Temp (24hrs), Avg:98.6 F (37 C), Min:98 F (36.7 C), Max:99.6 F (37.6 C)  Recent Labs  Lab 08/08/23 1656 08/08/23 2241  WBC 13.6*  --   CREATININE 1.57*  --   LATICACIDVEN 2.0* 2.4*    Estimated Creatinine Clearance: 31.9 mL/min (A) (by C-G formula based on SCr of 1.57 mg/dL (H)).    Allergies  Allergen Reactions   Morphine And Codeine Nausea And Vomiting   Metrizamide    Sulfa Antibiotics Other (See Comments)    Reaction: unknown   Ivp Dye [Iodinated Contrast Media] Hives   Latex Rash    Antimicrobials this admission:   >>    >>   Dose adjustments this admission:   Microbiology results:  BCx:   UCx:    Sputum:    MRSA PCR:   Thank you for allowing pharmacy to be a part of this patient's care.  Jamael Hoffmann D 08/09/2023 3:13 AM

## 2023-08-09 NOTE — ED Notes (Signed)
Pt with full thickness decubitus wound to Rt gluteal fold. Wet to dry guaze dressing place and covered with abd pad. Scant purulent drainage noted. Pt tolerated well.

## 2023-08-10 DIAGNOSIS — A419 Sepsis, unspecified organism: Secondary | ICD-10-CM | POA: Diagnosis not present

## 2023-08-10 LAB — URINE CULTURE

## 2023-08-10 LAB — BASIC METABOLIC PANEL
Anion gap: 11 (ref 5–15)
BUN: 19 mg/dL (ref 6–20)
CO2: 23 mmol/L (ref 22–32)
Calcium: 8.6 mg/dL — ABNORMAL LOW (ref 8.9–10.3)
Chloride: 104 mmol/L (ref 98–111)
Creatinine, Ser: 1.42 mg/dL — ABNORMAL HIGH (ref 0.61–1.24)
GFR, Estimated: 58 mL/min — ABNORMAL LOW (ref >60)
Glucose, Bld: 77 mg/dL (ref 70–99)
Potassium: 3.4 mmol/L — ABNORMAL LOW (ref 3.5–5.1)
Sodium: 138 mmol/L (ref 135–145)

## 2023-08-10 LAB — MRSA NEXT GEN BY PCR, NASAL: MRSA by PCR Next Gen: NOT DETECTED

## 2023-08-10 LAB — CORTISOL-AM, BLOOD: Cortisol - AM: 8 ug/dL (ref 6.7–22.6)

## 2023-08-10 LAB — CBC
HCT: 33.1 % — ABNORMAL LOW (ref 39.0–52.0)
Hemoglobin: 10 g/dL — ABNORMAL LOW (ref 13.0–17.0)
MCH: 23.7 pg — ABNORMAL LOW (ref 26.0–34.0)
MCHC: 30.2 g/dL (ref 30.0–36.0)
MCV: 78.4 fL — ABNORMAL LOW (ref 80.0–100.0)
Platelets: 419 10*3/uL — ABNORMAL HIGH (ref 150–400)
RBC: 4.22 MIL/uL (ref 4.22–5.81)
RDW: 18.4 % — ABNORMAL HIGH (ref 11.5–15.5)
WBC: 7.1 10*3/uL (ref 4.0–10.5)
nRBC: 0 % (ref 0.0–0.2)

## 2023-08-10 LAB — MAGNESIUM: Magnesium: 2.1 mg/dL (ref 1.7–2.4)

## 2023-08-10 LAB — PHOSPHORUS: Phosphorus: 2.4 mg/dL — ABNORMAL LOW (ref 2.5–4.6)

## 2023-08-10 MED ORDER — K PHOS MONO-SOD PHOS DI & MONO 155-852-130 MG PO TABS
500.0000 mg | ORAL_TABLET | Freq: Three times a day (TID) | ORAL | Status: AC
  Administered 2023-08-10 – 2023-08-11 (×3): 500 mg via ORAL
  Filled 2023-08-10 (×3): qty 2

## 2023-08-10 NOTE — Plan of Care (Signed)
  Problem: Fluid Volume: Goal: Hemodynamic stability will improve Outcome: Progressing   

## 2023-08-10 NOTE — Progress Notes (Addendum)
Progress Note    [name]  UEA:[id number] DOB: 1966/01/09  DOA: 08/08/2023 PCP: Marguarite Arbour, MD      Brief Narrative:    Medical records reviewed and are as summarized below:  [name] is a 57 y.o. male  with medical history significant for hypertension, dyslipidemia, urolithiasis, spina bifida and neurogenic bladder, sacral decubitus ulcer, who presented to the emergency room with acute onset of worsening pain from his sacral decubitus ulcer.  It was noted by his sister that it was getting bigger.  Patient has a urostomy and a colostomy.   On presentation he was mildly tachycardic at 108, otherwise normal vital, labs with hypokalemia, hyponatremia, hypochloremia, BUN of 83, creatinine 1.57, blood glucose 61, albumin 3.1 and troponin 8.5.  Leukocytosis at 13.6.  Lactic acid 2 >>2.4.  UA concerning for UTI.  Blood and urine cultures were sent.   Pelvic CT with large right decubitus ulcer with sequela of chronic osteomyelitis, left ischial decubitus ulcer with sequela of chronic osteomyelitis.  Chronic left hip deformity and effusion.  No superimposed acute inflammatory changes to suggest acute on chronic osteomyelitis.   Patient received IV fluid bolus with 2 L of LR.  Started on Rocephin and vancomycin.   11/5: Vital stable, leukocytosis resolved, all cell line decreased, mild hyponatremia resolved, potassium remained low at 2.7, magnesium normal, renal function seems stable around his baseline of CKD stage IIIa.  Procalcitonin of 0.21.  Preliminary blood cultures negative in 12-hour, urine cultures pending. General surgery was consulted to see if he need any debridement, no debridement needed at this time per surgery.    Assessment/Plan:   Principal Problem:   Sepsis (HCC) Active Problems:   Hypokalemia   Acute lower UTI   Dyslipidemia   GERD without esophagitis   Sacral wound    Body mass index is 28.17 kg/m.   Suspected sepsis probably from  acute UTI, history of recurrent UTIs: Urine culture showed multiple species.  Discontinue IV vancomycin.  Continue IV ceftriaxone.   Left and right ischial decubitus ulcers and sacral decubitus ulcers with chronic osteomyelitis: No evidence of acute osteomyelitis.  Patient was evaluated by Dr. Rivka Safer, ID specialist, on 05/16/2023.  Recommendations noted.  She said that antibiotics are not going to help with chronic osteomyelitis the patient has chronic immobility and bedbound status. Patient was evaluated by the general surgeon, Dr. Everlene Farrier.  No indication for debridement at this time. Continue local wound care.   Hypokalemia: Improving.  Continue potassium repletion. Mild hypophosphatemia: Replete with oral potassium phosphate   CKD stage IIIa: Creatinine is stable.   Other comorbidities include spina bifida with paraplegia and chronic mobility and chronic bedbound state, hyperlipidemia, GERD  Diet Order             Diet Heart Room service appropriate? Yes; Fluid consistency: Thin  Diet effective now                            Consultants: General surgeon  Procedures: None    Medications:    dicyclomine  20 mg Oral BID   enoxaparin (LOVENOX) injection  40 mg Subcutaneous Q24H   multivitamin with minerals  1 tablet Oral Daily   pantoprazole  40 mg Oral Daily   potassium chloride SA  20 mEq Oral Daily   QUEtiapine  25 mg Oral QHS   rosuvastatin  10 mg Oral QHS   sodium chloride flush  10  mL Intravenous Q12H   sucralfate  1 g Oral TID   traZODone  50 mg Oral QHS   Continuous Infusions:  cefTRIAXone (ROCEPHIN)  IV 2 g (08/10/23 1023)     Anti-infectives (From admission, onward)    Start     Dose/Rate Route Frequency Ordered Stop   08/10/23 2300  vancomycin (VANCOCIN) IVPB 1000 mg/200 mL premix  Status:  Discontinued        1,000 mg 200 mL/hr over 60 Minutes Intravenous Every 48 hours 08/09/23 0312 08/10/23 1217   08/09/23 0800  cefTRIAXone  (ROCEPHIN) 2 g in sodium chloride 0.9 % 100 mL IVPB        2 g 200 mL/hr over 30 Minutes Intravenous Every 24 hours 08/09/23 0132 08/16/23 0759   08/09/23 0145  vancomycin (VANCOCIN) IVPB 1000 mg/200 mL premix  Status:  Discontinued        1,000 mg 200 mL/hr over 60 Minutes Intravenous  Once 08/09/23 0132 08/09/23 0313   08/08/23 2130  ceFEPIme (MAXIPIME) 2 g in sodium chloride 0.9 % 100 mL IVPB        2 g 200 mL/hr over 30 Minutes Intravenous  Once 08/08/23 2117 08/08/23 2327   08/08/23 2130  metroNIDAZOLE (FLAGYL) IVPB 500 mg        500 mg 100 mL/hr over 60 Minutes Intravenous  Once 08/08/23 2117 08/08/23 2305   08/08/23 2130  vancomycin (VANCOCIN) IVPB 1000 mg/200 mL premix        1,000 mg 200 mL/hr over 60 Minutes Intravenous  Once 08/08/23 2117 08/09/23 0737              Family Communication/Anticipated D/C date and plan/Code Status   DVT prophylaxis: enoxaparin (LOVENOX) injection 40 mg Start: 08/09/23 0800     Code Status: Full Code  Family Communication: None Disposition Plan: Plan to discharge home   Status is: Inpatient Remains inpatient appropriate because: IV antibiotics       Subjective:   Interval events noted.  He complains of "a sore on my behind".  He does not feel any pain because he has no sensation on his lower back below the waist.  He also says he has been dealing with recurrent urinary tract infections.  Rane Pore, RN, was at the bedside  Objective:    Vitals:   08/09/23 1830 08/09/23 2048 08/10/23 0522 08/10/23 0925  BP: 107/65 (!) 99/56 (!) 102/52 (!) 83/72  Pulse: 77 76 75 85  Resp: 18 17 16 18   Temp: 98.6 F (37 C) 98.4 F (36.9 C) 99.6 F (37.6 C) 98.1 F (36.7 C)  TempSrc: Oral Oral    SpO2: 100% 100% 100% 96%  Weight:      Height:       No data found.   Intake/Output Summary (Last 24 hours) at 08/10/2023 1219 Last data filed at 08/10/2023 8119 Gross per 24 hour  Intake 1532.69 ml  Output 135 ml  Net 1397.69 ml    Filed Weights   08/09/23 0307  Weight: 53 kg    Exam:  GEN: NAD SKIN: Stage IV sacral decubitus ulcer EYES: No pallor or icterus ENT: MMM CV: RRR PULM: CTA B ABD: soft, ND, NT, +BS, RLQ urostomy, LLQ colostomy CNS: AAO x 3, paraplegic EXT: No edema or tenderness     Data Reviewed:   I have personally reviewed following labs and imaging studies:  Labs: Labs show the following:   Basic Metabolic Panel: Recent Labs  Lab 08/08/23 1656 08/09/23 0359  08/10/23 0918  NA 133* 136 138  K 2.8* 2.7* 3.4*  CL 97* 102 104  CO2 24 22 23   GLUCOSE 61* 127* 77  BUN 33* 30* 19  CREATININE 1.57* 1.59* 1.42*  CALCIUM 9.2 8.6* 8.6*  MG  --  2.3 2.1  PHOS  --   --  2.4*   GFR Estimated Creatinine Clearance: 35.2 mL/min (A) (by C-G formula based on SCr of 1.42 mg/dL (H)). Liver Function Tests: Recent Labs  Lab 08/08/23 1656  AST 28  ALT 25  ALKPHOS 85  BILITOT 0.4  PROT 8.5*  ALBUMIN 3.1*   No results for input(s): "LIPASE", "AMYLASE" in the last 168 hours. No results for input(s): "AMMONIA" in the last 168 hours. Coagulation profile Recent Labs  Lab 08/08/23 1656 08/09/23 1852  INR 1.0 1.1    CBC: Recent Labs  Lab 08/08/23 1656 08/09/23 0359 08/10/23 0918  WBC 13.6* 9.1 7.1  NEUTROABS 11.0*  --   --   HGB 12.6* 10.7* 10.0*  HCT 41.6 37.1* 33.1*  MCV 78.8* 80.7 78.4*  PLT 499* 490* 419*   Cardiac Enzymes: No results for input(s): "CKTOTAL", "CKMB", "CKMBINDEX", "TROPONINI" in the last 168 hours. BNP (last 3 results) No results for input(s): "PROBNP" in the last 8760 hours. CBG: No results for input(s): "GLUCAP" in the last 168 hours. D-Dimer: No results for input(s): "DDIMER" in the last 72 hours. Hgb A1c: No results for input(s): "HGBA1C" in the last 72 hours. Lipid Profile: No results for input(s): "CHOL", "HDL", "LDLCALC", "TRIG", "CHOLHDL", "LDLDIRECT" in the last 72 hours. Thyroid function studies: No results for input(s): "TSH",  "T4TOTAL", "T3FREE", "THYROIDAB" in the last 72 hours.  Invalid input(s): "FREET3" Anemia work up: No results for input(s): "VITAMINB12", "FOLATE", "FERRITIN", "TIBC", "IRON", "RETICCTPCT" in the last 72 hours. Sepsis Labs: Recent Labs  Lab 08/08/23 1656 08/08/23 2241 08/09/23 0359 08/09/23 1852 08/10/23 0918  PROCALCITON  --   --  0.21  --   --   WBC 13.6*  --  9.1  --  7.1  LATICACIDVEN 2.0* 2.4*  --  1.5  --     Microbiology Recent Results (from the past 240 hour(s))  Culture, blood (Routine x 2)     Status: None (Preliminary result)   Collection Time: 08/08/23  4:56 PM   Specimen: BLOOD  Result Value Ref Range Status   Specimen Description BLOOD BLOOD RIGHT ARM RFOA  Final   Special Requests   Final    BOTTLES DRAWN AEROBIC AND ANAEROBIC Blood Culture results may not be optimal due to an excessive volume of blood received in culture bottles   Culture   Final    NO GROWTH 2 DAYS Performed at [facility], [address] [place], [place] [address]    Report Status PENDING  Incomplete  Culture, blood (Routine x 2)     Status: None (Preliminary result)   Collection Time: 08/08/23  5:09 PM   Specimen: BLOOD  Result Value Ref Range Status   Specimen Description BLOOD BLOOD LEFT HAND LW  Final   Special Requests   Final    BOTTLES DRAWN AEROBIC AND ANAEROBIC Blood Culture adequate volume   Culture   Final    NO GROWTH 2 DAYS Performed at [facility], [address], [place], [place] [address]    Report Status PENDING  Incomplete  Urine Culture     Status: Abnormal   Collection Time: 08/08/23 10:41 PM   Specimen: Urine, Random  Result Value Ref Range Status   Specimen Description   Final    URINE, RANDOM Performed at [facility], [address] [place], [place] [address]    Special Requests   Final    NONE Reflexed from [id number] Performed at [facility], [address] [place], [place] [address]    Culture MULTIPLE SPECIES  PRESENT, SUGGEST RECOLLECTION (A)  Final   Report Status 08/10/2023 FINAL  Final  MRSA Next Gen by PCR, Nasal     Status: None   Collection Time: 08/10/23  4:32 AM   Specimen: Nasal Mucosa; Nasal Swab  Result Value Ref Range Status   MRSA by PCR Next Gen NOT DETECTED NOT DETECTED Final    Comment: (NOTE) The GeneXpert MRSA Assay (FDA approved for NASAL specimens only), is one component of a comprehensive MRSA colonization surveillance program. It is not intended to diagnose MRSA infection nor to guide or monitor treatment for MRSA infections. Test performance is not FDA approved in patients less than 78 years old. Performed at [facility], [address] [place], [place] [address]     Procedures and diagnostic studies:  CT PELVIS WO CONTRAST  Result Date: 08/08/2023 CLINICAL DATA:  Large pelvic ulceration, prior history of osteomyelitis. EXAM: CT PELVIS WITHOUT CONTRAST TECHNIQUE: Multidetector CT imaging of the pelvis was performed following the standard protocol without intravenous contrast. RADIATION DOSE REDUCTION: This exam was performed according to the departmental dose-optimization program which includes automated exposure control, adjustment of the mA and/or kV according to patient size and/or use of iterative reconstruction technique. COMPARISON:  CT pelvis dated 05/12/2023 suspected right mid abdominal FINDINGS: Urinary Tract: Bladder is decompressed. Suspected right lower abdominal ileal conduit. Bowel: Status post left hemicolectomy. Known left mid abdominal colostomy is not imaged. Vascular/Lymphatic: No evidence of aneurysm. No suspicious pelvic lymphadenopathy. 8 mm right common iliac node (series 7/image 22), within normal limits. Reproductive:  Prostatomegaly. Other:  No pelvic ascites. Musculoskeletal: Large right decubitus ulcer (series 7/image 78) with chronic destructive changes involving the right ischial tuberosity and inferior pubic ramus. This appearance  favors chronic osteomyelitis, without superimposed acute interval change. Chronic left ischial decubitus ulcer (series 7/image 70) with chronic destructive changes involving the left ischium, without superimposed acute interval change. Associated chronic left hip deformity and effusion. IMPRESSION: Large right decubitus ulcer with sequela of chronic osteomyelitis. Left ischial decubitus ulcer with sequela of chronic osteomyelitis. Associated chronic left hip deformity and effusion. No superimposed acute inflammatory changes to suggest acute on chronic osteomyelitis. Postsurgical changes, as above, incompletely visualized. Electronically Signed   By: Charline Bills M.D.   On: 08/08/2023 21:46   DG Chest 2 View  Result Date: 08/08/2023 CLINICAL DATA:  Suspected sepsis. EXAM: CHEST - 2 VIEW COMPARISON:  Chest radiograph dated 03/07/2021. FINDINGS: No focal consolidation, pleural effusion, pneumothorax. The cardiac silhouette is within normal limits. No acute osseous pathology. Scoliosis. IMPRESSION: No active cardiopulmonary disease. Electronically Signed   By: Elgie Collard M.D.   On: 08/08/2023 18:07               LOS: 2 days   Santresa Levett  [facility]   Pager on [email or web address]. If 7PM-7AM, please contact night-coverage at [email or web address]     08/10/2023, 12:19 PM

## 2023-08-10 NOTE — Plan of Care (Signed)

## 2023-08-10 NOTE — TOC Progression Note (Signed)
Transition of Care [facility]) - Progression Note    Patient Details  Name: [name] MRN: [id number] Date of Birth: 1966/08/30  [facility] CM/SW Contact  Truddie Hidden, RN Phone Number: 08/10/2023, 3:40 PM  Clinical Narrative:    RA completed  Admitted for: Sepsis r/t cellulitis per H&P Admitted from: Home with his sister Pharmacy: [facility]  Current home health/prior home health/DME: WC, MD office getting a hospital bed HH: no preference        Barriers to Discharge: Continued Medical Work up  Expected Discharge Plan and Services                                               Social Determinants of Health (SDOH) Interventions SDOH Screenings   Food Insecurity: No Food Insecurity (08/09/2023)  Housing: Low Risk  (08/09/2023)  Transportation Needs: No Transportation Needs (08/09/2023)  Utilities: Not At Risk (08/09/2023)  Depression (PHQ2-9): Low Risk  (12/09/2021)  Financial Resource Strain: Low Risk  (07/29/2023)   Received from [facility]  Tobacco Use: Low Risk  (08/09/2023)    Readmission Risk Interventions    08/10/2023    3:37 PM  Readmission Risk Prevention Plan  Transportation Screening Complete  PCP or Specialist Appt within 3-5 Days Complete  Social Work Consult for Recovery Care Planning/Counseling Complete  Palliative Care Screening Not Applicable  Medication Review Ocean[employer]) Complete

## 2023-08-11 DIAGNOSIS — A419 Sepsis, unspecified organism: Secondary | ICD-10-CM | POA: Diagnosis not present

## 2023-08-11 LAB — POTASSIUM: Potassium: 3.1 mmol/L — ABNORMAL LOW (ref 3.5–5.1)

## 2023-08-11 LAB — PHOSPHORUS: Phosphorus: 4.1 mg/dL (ref 2.5–4.6)

## 2023-08-11 MED ORDER — POTASSIUM CHLORIDE CRYS ER 20 MEQ PO TBCR
20.0000 meq | EXTENDED_RELEASE_TABLET | Freq: Three times a day (TID) | ORAL | Status: DC
Start: 2023-08-11 — End: 2023-08-12
  Administered 2023-08-11 – 2023-08-12 (×4): 20 meq via ORAL
  Filled 2023-08-11 (×4): qty 1

## 2023-08-11 NOTE — Plan of Care (Signed)

## 2023-08-11 NOTE — Consult Note (Signed)
WOC Nurse ostomy consult note Stoma type/location: RLQ urostomy, pouch leaking LLQ colostomy, stable Urostomy leaks with 2 piece flat system applied here, needs 1 piece convex Stomal assessment/size: LLQ colostomy 1" flush pink moist colostomy RLQ urostomy, slightly budded  Peristomal assessment: intact Treatment options for stomal/peristomal skin: barrier ring and 2 piece flat for colostomy  Barrier ring and 1 piece convex for urostomy Output clear yellow urine Soft brown stool Ostomy pouching: 1pc. Convex urostomy 2 piece flat with barrier ring Education provided: patient's sister cares for ostomies at home. Supplies from [facility].  Usually secures pouches with barrier strips. Discussed outpatient ostomy clinic and he indicates he lives in [place] and [place] is too far to drive. I leave a flyer for ongoing needs.  Enrolled patient in [employer] DC program: Yes previously.,  Will not follow at this time.  Please re-consult if needed.  Mike Gip MSN, RN, FNP-BC CWON Wound, Ostomy, Continence Nurse Outpatient [facility] [phone] Pager [phone]

## 2023-08-11 NOTE — Progress Notes (Signed)
Progress Note    Ryane Canavan  UUV:[id number] DOB: [birth year]  DOA: 08/08/2023 PCP: Marguarite Arbour, MD      Brief Narrative:    Medical records reviewed and are as summarized below:  [name] is a 57 y.o. male  with medical history significant for hypertension, dyslipidemia, urolithiasis, spina bifida and neurogenic bladder, sacral decubitus ulcer, who presented to the emergency room with acute onset of worsening pain from his sacral decubitus ulcer.  It was noted by his sister that it was getting bigger.  Patient has a urostomy and a colostomy.   On presentation he was mildly tachycardic at 108, otherwise normal vital, labs with hypokalemia, hyponatremia, hypochloremia, BUN of 83, creatinine 1.57, blood glucose 61, albumin 3.1 and troponin 8.5.  Leukocytosis at 13.6.  Lactic acid 2 >>2.4.  UA concerning for UTI.  Blood and urine cultures were sent.   Pelvic CT with large right decubitus ulcer with sequela of chronic osteomyelitis, left ischial decubitus ulcer with sequela of chronic osteomyelitis.  Chronic left hip deformity and effusion.  No superimposed acute inflammatory changes to suggest acute on chronic osteomyelitis.   Patient received IV fluid bolus with 2 L of LR.  Started on Rocephin and vancomycin.   11/5: Vital stable, leukocytosis resolved, all cell line decreased, mild hyponatremia resolved, potassium remained low at 2.7, magnesium normal, renal function seems stable around his baseline of CKD stage IIIa.  Procalcitonin of 0.21.  Preliminary blood cultures negative in 12-hour, urine cultures pending. General surgery was consulted to see if he need any debridement, no debridement needed at this time per surgery.    Assessment/Plan:   Principal Problem:   Sepsis (HCC) Active Problems:   Hypokalemia   Acute lower UTI   Dyslipidemia   GERD without esophagitis   Sacral wound    Body mass index is 28.17 kg/m.   Suspected sepsis probably from  acute UTI, history of recurrent UTIs: Urine culture showed multiple species.  Continue IV ceftriaxone with plan to complete 5 days of therapy on 08/12/2023   Left and right ischial decubitus ulcers and sacral decubitus ulcers with chronic osteomyelitis: No evidence of acute osteomyelitis.  Patient was evaluated by Dr. Rivka Safer, ID specialist, on 05/16/2023.  Recommendations noted.  She said that antibiotics are not going to help with chronic osteomyelitis because the patient has chronic immobility and bedbound status. Patient was evaluated by the general surgeon, Dr. Everlene Farrier.  No indication for debridement at this time. Continue local wound care. Recommended that he turn from side-to-side but he said he cannot clear his side because of urostomy and colostomy. Patient said he is expecting on her mattress to be delivered to his home soon.   Hypokalemia: Potassium down from 3.4-3.1.  Increase potassium from 20 mEq daily to 20 mEq 3 times a day which is his home dose. Mild hypophosphatemia: Improved   CKD stage IIIa: Creatinine is stable.   Other comorbidities include spina bifida with paraplegia and chronic mobility and chronic bedbound state, hyperlipidemia, GERD  Plan to discharge home tomorrow.  Diet Order             Diet Heart Room service appropriate? Yes; Fluid consistency: Thin  Diet effective now                            Consultants: General surgeon  Procedures: None    Medications:    dicyclomine  20 mg Oral BID  enoxaparin (LOVENOX) injection  40 mg Subcutaneous Q24H   multivitamin with minerals  1 tablet Oral Daily   pantoprazole  40 mg Oral Daily   potassium chloride SA  20 mEq Oral TID   QUEtiapine  25 mg Oral QHS   rosuvastatin  10 mg Oral QHS   sodium chloride flush  10 mL Intravenous Q12H   sucralfate  1 g Oral TID   traZODone  50 mg Oral QHS   Continuous Infusions:  cefTRIAXone (ROCEPHIN)  IV 2 g (08/11/23 0946)     Anti-infectives  (From admission, onward)    Start     Dose/Rate Route Frequency Ordered Stop   08/10/23 2300  vancomycin (VANCOCIN) IVPB 1000 mg/200 mL premix  Status:  Discontinued        1,000 mg 200 mL/hr over 60 Minutes Intravenous Every 48 hours 08/09/23 0312 08/10/23 1217   08/09/23 0800  cefTRIAXone (ROCEPHIN) 2 g in sodium chloride 0.9 % 100 mL IVPB        2 g 200 mL/hr over 30 Minutes Intravenous Every 24 hours 08/09/23 0132 08/16/23 0759   08/09/23 0145  vancomycin (VANCOCIN) IVPB 1000 mg/200 mL premix  Status:  Discontinued        1,000 mg 200 mL/hr over 60 Minutes Intravenous  Once 08/09/23 0132 08/09/23 0313   08/08/23 2130  ceFEPIme (MAXIPIME) 2 g in sodium chloride 0.9 % 100 mL IVPB        2 g 200 mL/hr over 30 Minutes Intravenous  Once 08/08/23 2117 08/08/23 2327   08/08/23 2130  metroNIDAZOLE (FLAGYL) IVPB 500 mg        500 mg 100 mL/hr over 60 Minutes Intravenous  Once 08/08/23 2117 08/08/23 2305   08/08/23 2130  vancomycin (VANCOCIN) IVPB 1000 mg/200 mL premix        1,000 mg 200 mL/hr over 60 Minutes Intravenous  Once 08/08/23 2117 08/09/23 0737              Family Communication/Anticipated D/C date and plan/Code Status   DVT prophylaxis: enoxaparin (LOVENOX) injection 40 mg Start: 08/09/23 0800     Code Status: Full Code  Family Communication: Plan discussed with Arline Asp, sister, on speaker phone in patient's room Disposition Plan: Plan to discharge home   Status is: Inpatient Remains inpatient appropriate because: IV antibiotics       Subjective:   Interval events noted.  No new complaints. Odell Pore, RN, was at the bedside.  Arline Asp, sister called the patient on his cell phone during this encounter.  Objective:    Vitals:   08/10/23 2100 08/10/23 2107 08/11/23 0414 08/11/23 0810  BP: (!) 81/41 95/67 97/64  (!) 102/55  Pulse: 75 75 69 73  Resp: 20 20 18 14   Temp: 98.2 F (36.8 C)  (!) 97.5 F (36.4 C) 97.8 F (36.6 C)  TempSrc: Oral   Oral  SpO2: 100%  100% 96% 98%  Weight:      Height:       No data found.   Intake/Output Summary (Last 24 hours) at 08/11/2023 1030 Last data filed at 08/11/2023 0415 Gross per 24 hour  Intake 200 ml  Output 1170 ml  Net -970 ml   Filed Weights   08/09/23 0307  Weight: 53 kg    Exam:  GEN: NAD SKIN: Warm and dry.  Stage IV sacral decubitus ulcer EYES: Abnormal left eye ENT: MMM CV: RRR PULM: CTA B ABD: soft, ND,, plus BS, RLQ urostomy, LLQ colostomy CNS: AAO  x 3, paraplegic EXT: No edema or erythema    Data Reviewed:   I have personally reviewed following labs and imaging studies:  Labs: Labs show the following:   Basic Metabolic Panel: Recent Labs  Lab 08/08/23 1656 08/09/23 0359 08/10/23 0918 08/11/23 0509  NA 133* 136 138  --   K 2.8* 2.7* 3.4* 3.1*  CL 97* 102 104  --   CO2 24 22 23   --   GLUCOSE 61* 127* 77  --   BUN 33* 30* 19  --   CREATININE 1.57* 1.59* 1.42*  --   CALCIUM 9.2 8.6* 8.6*  --   MG  --  2.3 2.1  --   PHOS  --   --  2.4* 4.1   GFR Estimated Creatinine Clearance: 35.2 mL/min (A) (by C-G formula based on SCr of 1.42 mg/dL (H)). Liver Function Tests: Recent Labs  Lab 08/08/23 1656  AST 28  ALT 25  ALKPHOS 85  BILITOT 0.4  PROT 8.5*  ALBUMIN 3.1*   No results for input(s): "LIPASE", "AMYLASE" in the last 168 hours. No results for input(s): "AMMONIA" in the last 168 hours. Coagulation profile Recent Labs  Lab 08/08/23 1656 08/09/23 1852  INR 1.0 1.1    CBC: Recent Labs  Lab 08/08/23 1656 08/09/23 0359 08/10/23 0918  WBC 13.6* 9.1 7.1  NEUTROABS 11.0*  --   --   HGB 12.6* 10.7* 10.0*  HCT 41.6 37.1* 33.1*  MCV 78.8* 80.7 78.4*  PLT 499* 490* 419*   Cardiac Enzymes: No results for input(s): "CKTOTAL", "CKMB", "CKMBINDEX", "TROPONINI" in the last 168 hours. BNP (last 3 results) No results for input(s): "PROBNP" in the last 8760 hours. CBG: No results for input(s): "GLUCAP" in the last 168 hours. D-Dimer: No results for  input(s): "DDIMER" in the last 72 hours. Hgb A1c: No results for input(s): "HGBA1C" in the last 72 hours. Lipid Profile: No results for input(s): "CHOL", "HDL", "LDLCALC", "TRIG", "CHOLHDL", "LDLDIRECT" in the last 72 hours. Thyroid function studies: No results for input(s): "TSH", "T4TOTAL", "T3FREE", "THYROIDAB" in the last 72 hours.  Invalid input(s): "FREET3" Anemia work up: No results for input(s): "VITAMINB12", "FOLATE", "FERRITIN", "TIBC", "IRON", "RETICCTPCT" in the last 72 hours. Sepsis Labs: Recent Labs  Lab 08/08/23 1656 08/08/23 2241 08/09/23 0359 08/09/23 1852 08/10/23 0918  PROCALCITON  --   --  0.21  --   --   WBC 13.6*  --  9.1  --  7.1  LATICACIDVEN 2.0* 2.4*  --  1.5  --     Microbiology Recent Results (from the past 240 hour(s))  Culture, blood (Routine x 2)     Status: None (Preliminary result)   Collection Time: 08/08/23  4:56 PM   Specimen: BLOOD  Result Value Ref Range Status   Specimen Description BLOOD BLOOD RIGHT ARM RFOA  Final   Special Requests   Final    BOTTLES DRAWN AEROBIC AND ANAEROBIC Blood Culture results may not be optimal due to an excessive volume of blood received in culture bottles   Culture   Final    NO GROWTH 3 DAYS Performed at [facility], [address] [place], [place] [address]    Report Status PENDING  Incomplete  Culture, blood (Routine x 2)     Status: None (Preliminary result)   Collection Time: 08/08/23  5:09 PM   Specimen: BLOOD  Result Value Ref Range Status   Specimen Description BLOOD BLOOD LEFT HAND LW  Final   Special  Requests   Final    BOTTLES DRAWN AEROBIC AND ANAEROBIC Blood Culture adequate volume   Culture   Final    NO GROWTH 3 DAYS Performed at [facility], [address] [place], [place] [address]    Report Status PENDING  Incomplete  Urine Culture     Status: Abnormal   Collection Time: 08/08/23 10:41 PM   Specimen: Urine, Random  Result Value Ref Range Status   Specimen  Description   Final    URINE, RANDOM Performed at [facility], [address], [place], [place] [address]    Special Requests   Final    NONE Reflexed from [id number] Performed at [facility], [address] [place], [place] [address]    Culture MULTIPLE SPECIES PRESENT, SUGGEST RECOLLECTION (A)  Final   Report Status 08/10/2023 FINAL  Final  MRSA Next Gen by PCR, Nasal     Status: None   Collection Time: 08/10/23  4:32 AM   Specimen: Nasal Mucosa; Nasal Swab  Result Value Ref Range Status   MRSA by PCR Next Gen NOT DETECTED NOT DETECTED Final    Comment: (NOTE) The GeneXpert MRSA Assay (FDA approved for NASAL specimens only), is one component of a comprehensive MRSA colonization surveillance program. It is not intended to diagnose MRSA infection nor to guide or monitor treatment for MRSA infections. Test performance is not FDA approved in patients less than 86 years old. Performed at [facility], [address] [place], [place] [address]     Procedures and diagnostic studies:  No results found.             LOS: 3 days   Alexine Pilant  [facility]   Pager on [email or web address]. If 7PM-7AM, please contact night-coverage at [email or web address]     08/11/2023, 10:30 AM

## 2023-08-11 NOTE — Care Management Important Message (Signed)
Important Message  Patient Details  Name: [name] MRN: [id number] Date of Birth: Jun 29, 1966   Important Message Given:  N/A - LOS <3 / Initial given by admissions     [name] 08/11/2023, 9:45 AM

## 2023-08-12 DIAGNOSIS — A419 Sepsis, unspecified organism: Secondary | ICD-10-CM | POA: Diagnosis not present

## 2023-08-12 LAB — POTASSIUM: Potassium: 3.8 mmol/L (ref 3.5–5.1)

## 2023-08-12 NOTE — Discharge Summary (Signed)
Physician Discharge Summary   Patient: [name] MRN: [id number] DOB: [birth year]  Admit date:     08/08/2023  Discharge date: 08/12/23  Discharge Physician: Lurene Shadow   PCP: Marguarite Arbour, MD   Recommendations at discharge:  {Tip this will not be part of the note when signed- Example include specific recommendations for outpatient follow-up, pending tests to follow-up on. (Optional):26781}  ***  Discharge Diagnoses: Principal Problem:   Sepsis (HCC) Active Problems:   Hypokalemia   Acute lower UTI   Dyslipidemia   GERD without esophagitis   Sacral wound  Resolved Problems:   * No resolved hospital problems. [facility] Course: Taken from H&P.  [name] is a 57 y.o. Caucasian male with medical history significant for hypertension, dyslipidemia, urolithiasis, spina bifida and neurogenic bladder, sacral decubitus ulcer, who presented to the emergency room with acute onset of worsening pain from his sacral decubitus ulcer.  It was noted by his sister that it was getting bigger.  Patient has a urostomy and a colostomy.   On presentation he was mildly tachycardic at 108, otherwise normal vital, labs with hypokalemia, hyponatremia, hypochloremia, BUN of 83, creatinine 1.57, blood glucose 61, albumin 3.1 and troponin 8.5.  Leukocytosis at 13.6.  Lactic acid 2 >>2.4.  UA concerning for UTI.  Blood and urine cultures were sent.  Pelvic CT with large right decubitus ulcer vesicular of chronic osteomyelitis, left ischial decubitus ulcer vesicular of chronic osteomyelitis.  Chronic left hip deformity and effusion.  No superimposed acute inflammatory changes to suggest acute on chronic osteomyelitis.  Patient received IV fluid bolus with 2 L of LR.  Started on Rocephin and vancomycin.  11/5: Vital stable, leukocytosis resolved, all cell line decreased, mild hyponatremia resolved, potassium remained low at 2.7, magnesium normal, renal function seems stable around his  baseline of CKD stage IIIa.  Procalcitonin of 0.21.  Preliminary blood cultures negative in 12-hour, urine cultures pending. General surgery was consulted to see if he need any debridement, no debridement needed at this time per surgery.  Assessment and Plan: * Sepsis [facility]) Patient met sepsis criteria with leukocytosis and tachycardia. Large decubitus ulcers with concern of infection.  Imaging with chronic osteomyelitis, no concern of acute on chronic. Prior wound cultures with Klebsiella on most recent done in August 2024, different organisms in the past.  Also concern of UTI. Preliminary blood cultures negative, urine cultures pending. General surgery was also consulted to see if he needed wound debridement. -Wound care consult -Continue with Rocephin and vancomycin -Follow-up cultures -Follow-up surgery recommendations  Acute lower UTI Patient has urostomy in place.  UA looked infected and urine cultures are pending. -Continue with ceftriaxone and vancomycin for now  Hypokalemia Persistent hypokalemia with potassium at 2.7 with normal magnesium this morning. -Replace potassium and monitor  Dyslipidemia - We will continue statin therapy.  GERD without esophagitis - We will continue PPI therapy and Carafate.      {Tip this will not be part of the note when signed Body mass index is 28.17 kg/m. , ,  Active Pressure Injury/Wound(s)     Pressure Ulcer  Duration          Pressure Injury 05/12/23 Ischial tuberosity Right Stage 4 - Full thickness tissue loss with exposed bone, tendon or muscle. 91 days   Pressure Injury 05/13/23 Ischial tuberosity Left Unstageable - Full thickness tissue loss in which the base of the injury is covered by slough (yellow, tan, gray, green or brown) and/or eschar (tan,  brown or black) in the wound bed. 91 days   Pressure Injury 08/09/23 Sacrum Stage 4 - Full thickness tissue loss with exposed bone, tendon or muscle. chronic Stage 4 pressure injury  to sacrum/ischium 3 days           (Optional):26781}  {(NOTE) Pain control PDMP Statment (Optional):26782} Consultants: *** Procedures performed: ***  Disposition: {Plan; Disposition:26390} Diet recommendation:  Discharge Diet Orders (From admission, onward)     Start     Ordered   08/12/23 0000  Diet - low sodium heart healthy        08/12/23 1059           {Diet_Plan:26776} DISCHARGE MEDICATION: Allergies as of 08/12/2023       Reactions   Morphine And Codeine Nausea And Vomiting   Metrizamide    Sulfa Antibiotics Other (See Comments)   Reaction: unknown   Ivp Dye [iodinated Contrast Media] Hives   Latex Rash        Medication List     STOP taking these medications    clotrimazole-betamethasone cream Commonly known as: Lotrisone   collagenase 250 UNIT/GM ointment Commonly known as: SANTYL   hydrochlorothiazide 25 MG tablet Commonly known as: HYDRODIURIL   traZODone 50 MG tablet Commonly known as: DESYREL       TAKE these medications    AZO CRANBERRY PO Take 1 tablet by mouth daily.   CENTRUM MEN PO Take 1 tablet by mouth daily.   dicyclomine 20 MG tablet Commonly known as: BENTYL Take 20 mg by mouth 2 (two) times daily.   pantoprazole 40 MG tablet Commonly known as: PROTONIX Take 1 tablet (40 mg total) by mouth daily.   potassium chloride SA 20 MEQ tablet Commonly known as: KLOR-CON M Take 20 mEq by mouth 3 (three) times daily.   QUEtiapine 25 MG tablet Commonly known as: SEROQUEL Take 25 mg by mouth at bedtime.   rosuvastatin 10 MG tablet Commonly known as: CRESTOR Take 10 mg by mouth at bedtime.   sucralfate 1 g tablet Commonly known as: CARAFATE Take 1 g by mouth 3 (three) times daily.   tiZANidine 4 MG tablet Commonly known as: ZANAFLEX Take 4 mg by mouth every 8 (eight) hours as needed for muscle spasms.               Discharge Care Instructions  (From admission, onward)           Start     Ordered    08/12/23 0000  Discharge wound care:       Comments: Wound care  Daily      Comments: Apply Aquacel [place] # [phone]) to sacrum/ischium wound Q day, then cover with ABD pad and tape   08/12/23 1059            Discharge Exam: Filed Weights   08/09/23 0307  Weight: 53 kg   ***  Condition at discharge: {DC Condition:26389}  The results of significant diagnostics from this hospitalization (including imaging, microbiology, ancillary and laboratory) are listed below for reference.   Imaging Studies: CT PELVIS WO CONTRAST  Result Date: 08/08/2023 CLINICAL DATA:  Large pelvic ulceration, prior history of osteomyelitis. EXAM: CT PELVIS WITHOUT CONTRAST TECHNIQUE: Multidetector CT imaging of the pelvis was performed following the standard protocol without intravenous contrast. RADIATION DOSE REDUCTION: This exam was performed according to the departmental dose-optimization program which includes automated exposure control, adjustment of the mA and/or kV according to patient size and/or use of iterative reconstruction  technique. COMPARISON:  CT pelvis dated 05/12/2023 suspected right mid abdominal FINDINGS: Urinary Tract: Bladder is decompressed. Suspected right lower abdominal ileal conduit. Bowel: Status post left hemicolectomy. Known left mid abdominal colostomy is not imaged. Vascular/Lymphatic: No evidence of aneurysm. No suspicious pelvic lymphadenopathy. 8 mm right common iliac node (series 7/image 22), within normal limits. Reproductive:  Prostatomegaly. Other:  No pelvic ascites. Musculoskeletal: Large right decubitus ulcer (series 7/image 78) with chronic destructive changes involving the right ischial tuberosity and inferior pubic ramus. This appearance favors chronic osteomyelitis, without superimposed acute interval change. Chronic left ischial decubitus ulcer (series 7/image 70) with chronic destructive changes involving the left ischium, without superimposed acute interval change.  Associated chronic left hip deformity and effusion. IMPRESSION: Large right decubitus ulcer with sequela of chronic osteomyelitis. Left ischial decubitus ulcer with sequela of chronic osteomyelitis. Associated chronic left hip deformity and effusion. No superimposed acute inflammatory changes to suggest acute on chronic osteomyelitis. Postsurgical changes, as above, incompletely visualized. Electronically Signed   By: Charline Bills M.D.   On: 08/08/2023 21:46   DG Chest 2 View  Result Date: 08/08/2023 CLINICAL DATA:  Suspected sepsis. EXAM: CHEST - 2 VIEW COMPARISON:  Chest radiograph dated 03/07/2021. FINDINGS: No focal consolidation, pleural effusion, pneumothorax. The cardiac silhouette is within normal limits. No acute osseous pathology. Scoliosis. IMPRESSION: No active cardiopulmonary disease. Electronically Signed   By: Elgie Collard M.D.   On: 08/08/2023 18:07    Microbiology: Results for orders placed or performed during the hospital encounter of 08/08/23  Culture, blood (Routine x 2)     Status: None (Preliminary result)   Collection Time: 08/08/23  4:56 PM   Specimen: BLOOD  Result Value Ref Range Status   Specimen Description BLOOD BLOOD RIGHT ARM RFOA  Final   Special Requests   Final    BOTTLES DRAWN AEROBIC AND ANAEROBIC Blood Culture results may not be optimal due to an excessive volume of blood received in culture bottles   Culture   Final    NO GROWTH 4 DAYS Performed at [facility], [address] [place], [place] [address]    Report Status PENDING  Incomplete  Culture, blood (Routine x 2)     Status: None (Preliminary result)   Collection Time: 08/08/23  5:09 PM   Specimen: BLOOD  Result Value Ref Range Status   Specimen Description BLOOD BLOOD LEFT HAND LW  Final   Special Requests   Final    BOTTLES DRAWN AEROBIC AND ANAEROBIC Blood Culture adequate volume   Culture   Final    NO GROWTH 4 DAYS Performed at [facility], [address] [place], [place] [address]    Report Status PENDING  Incomplete  Urine Culture     Status: Abnormal   Collection Time: 08/08/23 10:41 PM   Specimen: Urine, Random  Result Value Ref Range Status   Specimen Description   Final    URINE, RANDOM Performed at [facility], [address], [place], [place] [address]    Special Requests   Final    NONE Reflexed from [id number] Performed at [facility], [address] [place], [place] [address]    Culture MULTIPLE SPECIES PRESENT, SUGGEST RECOLLECTION (A)  Final   Report Status 08/10/2023 FINAL  Final  MRSA Next Gen by PCR, Nasal     Status: None   Collection Time: 08/10/23  4:32 AM   Specimen: Nasal Mucosa; Nasal Swab  Result Value Ref Range Status  MRSA by PCR Next Gen NOT DETECTED NOT DETECTED Final    Comment: (NOTE) The GeneXpert MRSA Assay (FDA approved for NASAL specimens only), is one component of a comprehensive MRSA colonization surveillance program. It is not intended to diagnose MRSA infection nor to guide or monitor treatment for MRSA infections. Test performance is not FDA approved in patients less than 77 years old. Performed at [facility], [address] [place], [place] [address]     Labs: CBC: Recent Labs  Lab 08/08/23 1656 08/09/23 0359 08/10/23 0918  WBC 13.6* 9.1 7.1  NEUTROABS 11.0*  --   --   HGB 12.6* 10.7* 10.0*  HCT 41.6 37.1* 33.1*  MCV 78.8* 80.7 78.4*  PLT 499* 490* 419*   Basic Metabolic Panel: Recent Labs  Lab 08/08/23 1656 08/09/23 0359 08/10/23 0918 08/11/23 0509 08/12/23 0521  NA 133* 136 138  --   --   K 2.8* 2.7* 3.4* 3.1* 3.8  CL 97* 102 104  --   --   CO2 24 22 23   --   --   GLUCOSE 61* 127* 77  --   --   BUN 33* 30* 19  --   --   CREATININE 1.57* 1.59* 1.42*  --   --   CALCIUM 9.2 8.6* 8.6*  --   --   MG  --  2.3 2.1  --   --   PHOS  --   --  2.4* 4.1  --    Liver Function Tests: Recent Labs  Lab 08/08/23 1656  AST 28  ALT 25  ALKPHOS  85  BILITOT 0.4  PROT 8.5*  ALBUMIN 3.1*   CBG: No results for input(s): "GLUCAP" in the last 168 hours.  Discharge time spent: {LESS THAN/GREATER XBJY:78295} 30 minutes.  Signed: Lurene Shadow, MD [facility] 08/12/2023

## 2023-08-12 NOTE — Plan of Care (Signed)
The patient has been discharged. IV removed.  Problem: Fluid Volume: Goal: Hemodynamic stability will improve 08/12/2023 1353 by [name], [place] Pore, RN Outcome: Completed/Met 08/12/2023 1115 by [name], Ehan Pore, RN Outcome: Progressing   Problem: Clinical Measurements: Goal: Diagnostic test results will improve 08/12/2023 1353 by [name], Miking Pore, RN Outcome: Completed/Met 08/12/2023 1115 by [name], Mccall Pore, RN Outcome: Progressing Goal: Signs and symptoms of infection will decrease 08/12/2023 1353 by [name], Daud Pore, RN Outcome: Completed/Met 08/12/2023 1115 by [name], Vash Pore, RN Outcome: Progressing   Problem: Respiratory: Goal: Ability to maintain adequate ventilation will improve 08/12/2023 1353 by [name], Tron Pore, RN Outcome: Completed/Met 08/12/2023 1115 by [name], Antawn Pore, RN Outcome: Progressing   Problem: Education: Goal: Knowledge of General Education information will improve Description: Including pain rating scale, medication(s)/side effects and non-pharmacologic comfort measures 08/12/2023 1353 by [name], Orie Pore, RN Outcome: Completed/Met 08/12/2023 1115 by [name], Chadrick Pore, RN Outcome: Progressing   Problem: Health Behavior/Discharge Planning: Goal: Ability to manage health-related needs will improve 08/12/2023 1353 by [name], Emersyn Pore, RN Outcome: Completed/Met 08/12/2023 1115 by [name], Raven Pore, RN Outcome: Progressing   Problem: Clinical Measurements: Goal: Ability to maintain clinical measurements within normal limits will improve 08/12/2023 1353 by [name], Zyron Pore, RN Outcome: Completed/Met 08/12/2023 1115 by [name], Tristain Pore, RN Outcome: Progressing Goal: Will remain free from infection 08/12/2023 1353 by [name], Ibraham Pore, RN Outcome: Completed/Met 08/12/2023 1115 by [name], Carson Pore, RN Outcome: Progressing Goal: Diagnostic test results will  improve 08/12/2023 1353 by Monica Becton, RN Outcome: Completed/Met 08/12/2023 1115 by [name], Hermilo Pore, RN Outcome: Progressing Goal: Respiratory complications will improve 08/12/2023 1353 by Monica Becton, RN Outcome: Completed/Met 08/12/2023 1115 by [name], Gerrick Pore, RN Outcome: Progressing Goal: Cardiovascular complication will be avoided 08/12/2023 1353 by Monica Becton, RN Outcome: Completed/Met 08/12/2023 1115 by Monica Becton, RN Outcome: Progressing   Problem: Activity: Goal: Risk for activity intolerance will decrease 08/12/2023 1353 by [name], Briyan Pore, RN Outcome: Completed/Met 08/12/2023 1115 by [name], Geremiah Pore, RN Outcome: Progressing   Problem: Nutrition: Goal: Adequate nutrition will be maintained 08/12/2023 1353 by [name], Jonathan Pore, RN Outcome: Completed/Met 08/12/2023 1115 by [name], Augustin Pore, RN Outcome: Progressing   Problem: Coping: Goal: Level of anxiety will decrease 08/12/2023 1353 by [name], Renny Pore, RN Outcome: Completed/Met 08/12/2023 1115 by [name], Deryk Pore, RN Outcome: Progressing   Problem: Elimination: Goal: Will not experience complications related to bowel motility 08/12/2023 1353 by Monica Becton, RN Outcome: Completed/Met 08/12/2023 1115 by [name], Kabeer Pore, RN Outcome: Progressing Goal: Will not experience complications related to urinary retention 08/12/2023 1353 by Monica Becton, RN Outcome: Completed/Met 08/12/2023 1115 by [name], Curtis Pore, RN Outcome: Progressing   Problem: Pain Management: Goal: General experience of comfort will improve 08/12/2023 1353 by Monica Becton, RN Outcome: Completed/Met 08/12/2023 1115 by [name], Jesstin Pore, RN Outcome: Progressing   Problem: Safety: Goal: Ability to remain free from injury will improve 08/12/2023 1353 by [name], Tylyn Pore, RN Outcome:  Completed/Met 08/12/2023 1115 by [name], Kyran Pore, RN Outcome: Progressing   Problem: Skin Integrity: Goal: Risk for impaired skin integrity will decrease 08/12/2023 1353 by Monica Becton, RN Outcome: Completed/Met 08/12/2023 1115 by Monica Becton, RN Outcome: Progressing

## 2023-08-12 NOTE — Plan of Care (Signed)

## 2023-08-13 LAB — CULTURE, BLOOD (ROUTINE X 2)
Culture: NO GROWTH
Culture: NO GROWTH
Special Requests: ADEQUATE

## 2023-08-15 ENCOUNTER — Encounter: Payer: 59 | Attending: Physician Assistant | Admitting: Physician Assistant

## 2023-08-15 DIAGNOSIS — Z906 Acquired absence of other parts of urinary tract: Secondary | ICD-10-CM | POA: Diagnosis not present

## 2023-08-15 DIAGNOSIS — I129 Hypertensive chronic kidney disease with stage 1 through stage 4 chronic kidney disease, or unspecified chronic kidney disease: Secondary | ICD-10-CM | POA: Insufficient documentation

## 2023-08-15 DIAGNOSIS — L89314 Pressure ulcer of right buttock, stage 4: Secondary | ICD-10-CM | POA: Diagnosis present

## 2023-08-15 DIAGNOSIS — N183 Chronic kidney disease, stage 3 unspecified: Secondary | ICD-10-CM | POA: Insufficient documentation

## 2023-08-15 DIAGNOSIS — Q760 Spina bifida occulta: Secondary | ICD-10-CM | POA: Insufficient documentation

## 2023-08-15 DIAGNOSIS — I2510 Atherosclerotic heart disease of native coronary artery without angina pectoris: Secondary | ICD-10-CM | POA: Diagnosis not present

## 2023-08-15 DIAGNOSIS — Z933 Colostomy status: Secondary | ICD-10-CM | POA: Insufficient documentation

## 2023-08-15 DIAGNOSIS — M8668 Other chronic osteomyelitis, other site: Secondary | ICD-10-CM | POA: Insufficient documentation

## 2023-08-15 NOTE — Progress Notes (Signed)
Heimdal, [name] ([id number]) [id number] Page 1 of 13 Visit Report for 08/15/2023 Chief Complaint Document Details Patient Name: Date of Service: [name] [facility], [name] 08/15/2023 1:45 PM Medical Record Number: [id number] Patient Account Number: [id number] Date of Birth/Sex: Treating RN: 1965/11/29 (57 y.o. [name]) [name] Primary Care Provider: Aram [name] Other Clinician: Betha [name] Referring Provider: Treating Provider/Extender: [name] in Treatment: 19 Information Obtained from: Patient Chief Complaint Right ischial tuberosity pressure ulcer with chronic osteomyelitis and left gluteal ulcer Electronic Signature(s) Signed: 08/15/2023 1:35:48 PM By: Allen Derry PA-C Entered By: Allen Derry on 08/15/2023 13:35:48 -------------------------------------------------------------------------------- Debridement Details Patient Name: Date of Service: [name] TH, [name] 08/15/2023 1:45 PM Medical Record Number: [id number] Patient Account Number: [id number] Date of Birth/Sex: Treating RN: 1966-01-03 (57 y.o. [name] Primary Care Provider: Aram [name] Other Clinician: Betha [name] Referring Provider: Treating Provider/Extender: [name] in Treatment: 19 Debridement Performed for Assessment: Wound #5 Right,Midline Gluteus Performed By: Physician Allen Derry, PA-C Debridement Type: Debridement Level of Consciousness (Pre-procedure): Awake and Alert Pre-procedure Verification/Time Out Yes - 14:03 Taken: Start Time: 14:03 Percent of Wound Bed Debrided: 100% T Area Debrided (cm): otal 37.29 Tissue and other material debrided: Viable, Non-Viable, Slough, Subcutaneous, Biofilm, Slough Level: Skin/Subcutaneous Tissue Debridement Description: Excisional Instrument: Curette Bleeding: Minimum Hemostasis Achieved: Pressure Response to Treatment: Procedure was tolerated well Level of  Consciousness ([name] Awake and Alert procedure): [name], [name] ([id number]) [id number] Page 2 of 13 [name] Debridement Measurements of Total Wound Length: (cm) 5 Stage: Category/Stage IV Width: (cm) 9.5 Depth: (cm) 1.4 Volume: (cm) 52.229 Character of Wound/Ulcer [name] Debridement: Stable [name] Procedure Diagnosis Same as Pre-procedure Electronic Signature(s) Unsigned Entered By: [name] on 08/15/2023 14:04:16 -------------------------------------------------------------------------------- HPI Details Patient Name: Date of ServiceEulas [name] [facility], [name] 08/15/2023 1:45 PM Medical Record Number: [id number] Patient Account Number: [id number] Date of Birth/Sex: Treating RN: [birth year] (57 y.o. [name] Primary Care Provider: Aram [name] Other Clinician: Betha [name] Referring Provider: Treating Provider/Extender: [name] in Treatment: 19 History of Present Illness HPI Description: 57 year old male with a history of spina bifida presenting to [place] with a history of a open wound on the left gluteal region near his upper thigh which she's had for several weeks. He was seen in the ED recently at [facility] and was put on doxycycline and was advised some local care. his past medical history is significant for spinal bifid, neurogenic bladder, kidney stones, sacral pressure sores, constipation. Past surgical history significant for partial cystectomy, ileal conduit, VP shunt removal, percutaneous nephro lithotripsy. In the remote past the patient says he's had some treatment for a ulcer on this area and was treated with a skin graft. Readmission: 10/16/2021 upon evaluation today patient appears to be doing somewhat poorly in regard to a fairly large wound noted over the right ischial tuberosity location. This is a stage IV pressure ulcer. Of note this is also positive for osteomyelitis upon a CT scan which was  performed during the time that he was admitted in the hospital on 09/19/2021. With that being said it is noted in the right inferior buttock that he has a decubitus ulcer which extends to the posterior toe inferior right ischium where there is evidence of osteomyelitis. When he was here in the office actually missed that this was positive I missed read it and thought it said that there was no osteomyelitis. That is not the case there is  in fact osteomyelitis noted here. I actually did review his notes as well in epic. Looking to the discharge summary it appears that the patient was admitted from 09/19/2021 through 09/23/2021. He was discharged home with home health care according to the note. With that being said from what I understand his sister actually takes care of him at this point. He was also to follow-up with a primary care provider and here at the wound care center within a week. I am just now seeing him on the 13th almost a month after discharge. He does have a history of again osteomyelitis of this right hip/ischial tuberosity location. 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 The reason for his admission was actually worsening of the decubitus ulcer upon admission. He does have chronic osteomyelitis of this area. He was treated with empiric antibiotics he had acute kidney injury which improved with IV fluids he also had hypokalemia. Surgical debridement was performed by general surgery at that time and ID was consulted to assist with management according to the notes. IV antibiotics were recommended but patient declined and wanted oral antibiotics at discharge. Therefore no IV antibiotics were planned for discharge time. This case was under the care of Dr. Joylene Draft while he was in the hospital when she did discharge him with a 2-week course  of doxycycline and Augmentin according to the notes. It looks like Dr. Judithann Sheen office did call and leave a message for the patient to get scheduled for a follow-up visit after hospital discharge I do not see where he showed up in fact it appears that the patient has a no-show letter from Dr. Judithann Sheen office noted in epic due to a appointment that was missed on 10/06/2021. Looking at the pictures from Dr. Cristine Polio notes on 09/23/2021 it does appear that the wound is a little bit better as far as the cleanliness of the surface of the wound today but nonetheless still is a very significant wound. 10/26/2021 upon evaluation today patient appears to be doing okay in regard to his wound. Again this is a wound that he did indeed have osteomyelitis and previous. With that being said I do not see any signs of a worsening infection at this time which is good news. Overall I think that we are headed in the right direction although still I think he needs more aggressive offloading. 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 I do believe this would be ideal for him to be honest. Readmission: 01-15-2022 upon evaluation today patient presents for reevaluation he was last seen on October 26, 2021. At that time unfortunately he was having issues with a significant ischial pressure ulcer on the right ischial tuberosity. Subsequently he ended up having decent response to the Dakin's moistened gauze dressing that was previously being utilized and subsequently the patient's sister who is his primary caregiver as well states that she decided just to try to manage this at [place] ([id number]) [id number] Page 3 of 13 home. Nonetheless unfortunately this is getting a little larger not better. He does spend a lot of the day in his chair. He does have an air mattress according what they tell me today. That was previously ordered. With that being said I do believe that  at this time things do appear to be doing  much better from the standpoint of infection I do not see any signs of overt infection. There is also no evidence of systemic infection. No fevers, chills, nausea, vomiting, or diarrhea. With that being said I do believe based on what I am seeing the wound is a bit cleaner and he possibly could be an excellent candidate for a wound VAC. 02-05-2022 upon evaluation today patient appears to be doing about the same in regard to his wound. Unfortunately he is not any better but he also really has not had the wound VAC on sufficiently. There is been some confusion with the orders part of that I think is our follow-up with the way the order was written part of it may be with how the wound VAC is being placed either way we need to see what we can do to try to improve things in this regard. We will clarify the orders today going forward for home health. Unfortunately the patient does have a new wound today which is on the opposite side. This is not good 1 was bad enough have another area to open is definitely not a good thing. Its not as deep but nonetheless is also not very shallow either. We may potentially be able to get this to the point that we could bridge the 2 together in order to wound VAC both but right now want a focus on to try to get the wound VAC going for the main wound initially we will likely use Hydrofera Blue on the new wound.Marland Kitchen 02-19-2022 upon evaluation today patient's wound appears to be doing a little bit worse in the way of maceration fortunately there is no signs of active infection locally or systemically which is great news. No fevers, chills, nausea, vomiting, or diarrhea. 03-05-2022 upon evaluation today patient unfortunately does not appear to be doing nearly as good in regard to his wounds as I would like to see. Fortunately I do not see any evidence of active infection that is obvious although I am can obtain a wound culture to ensure that  there is not anything getting worse here as far as the wound without the wound VAC on his concern. With that being said I will go ahead and get this sent in and evaluated will initiate treatment as needed going forward if necessary. With regard to the wound with the wound VAC this continues to be placed directly over the wound and there is obvious signs of deep tissue injury. The suction being here means that he is sitting on it I think this may be part of the reason why it is coming off although not 100% certain. 03-12-2022 upon evaluation today patient still has not gotten the antibiotics that were called in for him 4 days ago. He tells me that his sister just told him this morning that they were ready but that she has not had a chance to go pick them up. Nonetheless I feel like that this is really something he needs any should have been taken that not the other antibiotics which were not doing the job for him to be honest. The patient states and I completely agree that there is really no way he would have known relying on his sister to let him know obviously. Nonetheless I do believe that he needs to not be taking any other antibiotics and be on the Levaquin as soon as possible. He tells me that she said she was going to pick them up today. In regard to home  health I am hopeful that they actually have been bridging this now. I do think that the wound looks better and this is good news. With that being said I am not certain that I can really tell for sure how this was attached it was removed before he came in and the black foam left in place. I really want him to leave the wound VAC in place until he comes in so that we can monitor and make sure that this is being done correctly he voiced understanding. 03-19-2022 upon evaluation today and much happier with how things appear as far as the patient's wounds are concerned. I think he is on a much better track and they are doing a better job at bridging  although its not quite where I like to see it is still more posterior which I think the patient needs to be a little bit more lateral on his side. Either way this is something that I did marked with a small dot today for him to tell the home health nurses well so she can bridge it to that location also think the tubing should go up instead of going down to just make it less cumbersome overall for him. 04-19-2022 upon evaluation today patient appears to be doing a little better in regard to his wounds. Fortunately I do not see any signs of infection I do believe he is on the right track. The wound VAC does seem to be helping to clean the wound up a bit and bring it in from the base at work. This is good news. 05-10-2022 upon evaluation today patient appears to be doing well currently in regard to his wounds in general in fact of the smaller of the 2 wounds without the wound VAC actually appears to be doing so much better this is almost completely closed and very pleased in that regard. He has been staying off of this he tells me and shows. With that being said the wound with the wound VAC is going require some sharp debridement there is some tissue which is not quite as healthy and we are going to go ahead and continue that for probably 2 more weeks although I think we may be closer to discontinuing this as well since it has healed and quite significantly. 05-24-2022 upon evaluation today patient appears to be doing well with regard to his wound in fact this looks better than I have been expected. This actually I think is a good time to go ahead and switch away from the wound VAC based on what I am seeing. I think we go to [facility] with a bordered foam dressing. 06-21-2022 upon evaluation today patient appears to be doing well currently in regard to his wound this is measuring smaller and looks well. Fortunately there does not appear to be any signs of active infection locally or systemically at this  time which is great news. No fevers, chills, nausea, vomiting, or diarrhea. 07-29-2022 upon evaluation today patient appears to be doing well currently in regard to his wound. This is actually measuring smaller although there is a lot of hypergranulation noted. Fortunately there does not appear to be any signs of active infection locally or systemically at this time. No fevers, chills, nausea, vomiting, or diarrhea. 11/9; pressure ulcer on the right buttock in the setting of spina bifida and lower extremity paralysis. He has been using [facility] 09-02-2022 upon evaluation today patient appears to be doing well currently in regard to his  wound. We have been using Hydrofera Blue this is showing signs of improvement as far as getting smaller is concerned. There is some drainage around the edges of the wound but I think a lot of this is due to the fact he did not even have a dressing on that he came in today. 12/29; right buttock pressure area which was initially stage IV. This is in the setting of a patient with spina bifida spends most of his time in a wheelchair. I am not certain how much he is offloading this. He has been using for [employer] as a primary dressing. When he was here the last visit he was felt to have hyper granulated tissue and was treated with silver nitrate chemical cauterization 10-14-2022 upon evaluation today patient appears to be doing poorly in regard to the wound in the right ischial location. Unfortunately he tells me that the dressing seems to be falling off frequently. Unfortunately I think that this has contributed to him developing an infection which appears to be exemplified by the fact that he has bone exposed which is actually necrotic and working its way out of the wound up and have to perform a fairly significant debridement today it does appear. I think that he needs to have these dressings ensure that they are in place at all times. Also discussed with the  patient that we will get a need to do some testing in order to see if he indeed has an infection. If he does have a bone infection which I think is pretty likely to be osteomyelitis of this ischial location. I discussed that with the patient in detail today and depending on what we see him and make any adjustments in care as far as going forward is concerned. Obviously depressed antibiotic that we gave him that he tells me was somewhat irritating as far as the stomach side effects are concerned. He states he really would not want to be on that again. Either way I feel he is probably going to be placed on something once we get the results of the culture back. I am honestly concerned about the fact that he comes into the clinic without any dressings in place I am not sure how often they are really staying where they need to be as far as this wound is concerned. I actually cannot remember seeing him anytime in the past 2 months or so at least that he came in with a dressing intact. 10-21-2022 upon evaluation today patient presents for initial inspection here in our clinic concerning issues that he has been having with a wound in the sacral area. Again last time he was here I did debride away necrotic bone which was confirmed on pathology although it was not necessarily confirmed that this was secondary to osteomyelitis on the pathology report. Also the x-ray did not neither confirm nor deny the presence of osteomyelitis and his culture unfortunately showed multiple organisms with nothing predominance of this does not help to rule and tailor down exactly what is going on here. Fortunately I do not see any signs of active infection systemically which is great news. With that being said locally I am definitely seeing some issues here. In fact this is warm to touch around the sacral area and I think that cellulitis is definitely present I just cannot pinpoint the exact organism. With that being said I did  discuss with the patient as well today that I do believe that he probably needs an  MRI in order to further quantify the extent of her likely osteomyelitis and this again is something that we are going to order this will need to be without contrast due to the fact that he is allergic to contrast dye. 11-18-2022 upon evaluation today patient's wound is actually showing some signs of improvement currently. We have been doing the Dakin's moistened gauze dressing which I think is helping to clean up the wound for seeing some granulation growing and still the tissue is not quite as healthy as I would love to see but BRIANNE, MUHS ([id number]) [id number] Page 4 of 13 better than previous. He does have osteomyelitis but the good news is he seems to be treated well with the antibiotics he does have a refill that was just picked up and I think that he needs to continue to take that for certain. 3/7; patient is on a 6-week clinic hiatus. He lives at home with his sister however she is not at home all the time. He transfers out of bed and a sliding board. He claims he is only in the in the wheelchair about 4 5 hours. He does have underlying osteomyelitis I am not sure about the status of the antibiotics here. Part of the wound still easily probes to bone 12-23-2022 upon evaluation today patient appears to be doing well currently in regard to his wound all things considered there is still a lot of hypergranulation but I do feel like that with the Vashe moistened gauze is actually doing better than he was previously with the Hydrofera Blue. This just did not seem to be staying in place and with the Vashe through able to change this more frequently. Readmission: 03-31-2023 this is a gentleman whom I have seen multiple times intermittently throughout the years compliance has been a real issue however. I do feel like there is some significant issues here still with the fact that dressing  changes and keeping dressings in place are still pretty much as a significant problem here for [place]. I do not see any signs of active infection at this time which is great news but at the same time the wounds do not worse in fact he has another wound different than what I even noted previous. Subsequently he I do believe that the patient should continue to do monitor for any evidence of infection or worsening. Overall I think he needs to have regular follow-up visits with [place] and I think that he needs to have dressings in place at all times. He tells me that he is at home primarily by himself transferring himself from the first thing in the morning through at least 3 in the afternoon as his sister works he really needs somebody gets a caregiver with him throughout the day. 04-12-2023 upon evaluation today patient appears to be doing a little worse currently in regard to his wounds. He has been tolerating the dressing changes without complication. Fortunately I do not see any evidence of active infection locally nor systemically at this point. 04-21-2023 upon evaluation today patient's wound unfortunately appears to have purulent drainage and also there is a malodorous drainage at this point. Unfortunately I think that there is something going on here that does not seem to be doing nearly as good as what we would like to see. I think that he is likely going to require some antibiotics. He has taken Cipro before without complication. 7/25; this is a patient with a stage IV pressure ulcer on his right buttock x  2. On 7/18 he had purulent drainage he had a PCR culture and is now on Cipro he is tolerating this well. Using Hydrofera Blue's a 2 bit and border foam his sister is changing the dressing After his sister goes to work the patient is apparently in a wheelchair all day. There is no way to heal the wound like this with that degree of prolonged pressure. 05-12-2023 upon evaluation today patient unfortunately  appears to be doing much worse than what we previously seen. Last time he was here he was seen by Dr. Leanord Hawking. This was shortly after I put him on Cipro he was tolerating that well. Nonetheless he did not show up last week apparently there was a transportation issue. This week upon arrival this wound appears to be doing significantly worse there is a significant malodorous drainage and this is deeper he has erythema in the gluteal region around it appears to be infected and to be honest despite the Cipro and oral antibiotics he seems to be doing worse not better. 05-19-2023 upon evaluation patient appears to be doing better than last time I saw him with regard to his wound in the right gluteal location. Fortunately there does not appear to be any signs of systemic infection though he does have evidence of osteomyelitis noted which were acute on chronic and that was the findings that were noted in the hospital when he was admitted most recently after I sent him from May 12, 2023 through May 17, 2023. The patient was treated with IV Zosyn and bank while in the hospital and discharged on Augmentin although that has not been picked up yet it has been 2 days since he was discharged he states that "the pharmacy never called his sister to let her know it was ready". Nonetheless this needs to be gotten ASAP to make sure that he is fully treated as far as the infection is concerned I discussed that with him today as well. 05-26-2023 upon evaluation today patient appears to be doing better today with regard to his wounds compared to what we have noticed previous. Fortunately I do not see any evidence of active infection locally or systemically which is great news and in general I do believe that we are making headway towards complete closure. With that being said we still had some trouble getting the order done for the trapeze and aids to help him with regard to his daily activities and preventing the  dressings from coming off. We have reached out to his CAP Worker and have not gotten in touch with her as of yet. I did recommend that the patient try to see. He or his sister to get in touch with [facility] and see if they can help [place] in that regard. 8/29; patient with 2 wounds in close juxtaposition on the right buttock. Both of these probes very close to the bone especially the area over the ischial tuberosity. Patient was recently in the hospital for additional antibiotics I did not look at this today. We are using Dakin's wet to dry. His sister changes in dressings. The wounds are measuring larger. 06-16-2023 upon evaluation today patient appears to be doing worse currently at this time. Fortunately I do not see any signs of infection which is good news but at the same time he does have deep tissue injury which has me concerned about potential further breakdown here. He does tell me that he has been up on it a little bit more. 07-12-2023 upon evaluation  today patient appears to be doing okay in regard to his wound there is actually an area that is merged between the 2 and therefore this is really just 1 wound we converted it as such. There is no separation between the 2. I knew that this was going to happen it was just a matter of time. With that being said I do not see any signs of infection obvious at this point but I do believe that this is still an issue with him keeping the dressing in place he tells me it is not staying in place which does have me concerned as well. Fortunately I do not see any evidence of worsening overall systemically though locally I am a little concerned about how the wounds have digressed since I last saw him. 07-28-2023 upon evaluation today patient unfortunately appears to be doing worse with a new wound at this point on the left side that was not there last time I saw him. He has had missed visits in the past 4 visits he has missed 2 of those unfortunately. This again  was a condition about seeing him back that he had to make sure that he showed up for all visits. That is not happening at the moment and I have discussion with him today about that. I discussed that if he is not going to show up for his visits that we are going to have to discharge him that was the original agreement and that is still where things stand at this point to be honest. The patient voiced understanding. I told him that we are going to give him 1 more shot today before we would proceed to anything like that but again if he is not coming to the appointments as directed that there is really no point and has continued to have them on the books as far as trying to get this healed is concerned. He voiced understanding. He still tells me that his dressings were also not staying in place in fact he tells me that every time he gets out of the bed the dressing is already off. He came in today with no dressing on. Noncompliance has been a significant issue through the course of the treatment with this gentleman and still continues to be so. 08-04-2023 upon evaluation today patient's wound actually appears to be doing better than it has for quite some time. I am actually seeing some improvement which is good news and in general I do believe that we will make an some headway here. I do not see any signs of obvious infection there is also no need for obvious debridement today. Overall I think that we are on the right track. He did actually have a dressing in place upon arrival today. 08-15-2023 upon evaluation today patient appears to be doing well currently in regard to his wound. He actually looks much better he has been in the hospital since I last saw him. He tells me that his sister got concerned about the appearance of his wound he was there where they did aggressive offloading and put him on IV antibiotics as well he seems to be doing much better. Electronic Signature(s) Signed: 08/15/2023 2:13:52  PM By: Allen Derry PA-C Entered By: Allen Derry on 08/15/2023 14:13:52 [name] ([id number]) [id number] Page 5 of 13 -------------------------------------------------------------------------------- Physical Exam Details Patient Name: Date of ServiceEulas [name] [facility], [name] 08/15/2023 1:45 PM Medical Record Number: [id number] Patient Account Number: [id number] Date of Birth/Sex: Treating RN: [birth year] (  57 y.o. [name] Primary Care Provider: Aram [name] Other Clinician: Betha [name] Referring Provider: Treating Provider/Extender: Hermine Messick Weeks in Treatment: 21 Constitutional Well-nourished and well-hydrated in no acute distress. Respiratory normal breathing without difficulty. Psychiatric this patient is able to make decisions and demonstrates good insight into disease process. Alert and Oriented x 3. pleasant and cooperative. Notes Upon inspection patient's wound bed actually showed signs of good granulation epithelization at this point. Fortunately I do not see any signs of active infection at this time which is great news and overall I am extremely pleased with where we stand. No fevers, chills, nausea, vomiting, or diarrhea. Electronic Signature(s) Signed: 08/15/2023 2:14:08 PM By: Allen Derry PA-C Entered By: Allen Derry on 08/15/2023 14:14:08 -------------------------------------------------------------------------------- Physician Orders Details Patient Name: Date of Service: [name] TH, [name] 08/15/2023 1:45 PM Medical Record Number: [id number] Patient Account Number: [id number] Date of Birth/Sex: Treating RN: [birth year] (57 y.o. [name] Primary Care Provider: Aram [name] Other Clinician: Betha [name] Referring Provider: Treating Provider/Extender: [name] in Treatment: 19 The following information was scribed by: [name] The information was scribed  for: Allen Derry Verbal / Phone Orders: No Diagnosis Coding ICD-10 Coding Code Description M86.68 Other chronic osteomyelitis, other site L89.314 Pressure ulcer of right buttock, stage 4 L89.323 Pressure ulcer of left buttock, stage 3 Q76.0 Spina bifida occulta Z93.3 Colostomy status NOMAN, VANDAGRIFF ([id number]) [id number] Page 6 of 13 I10 Essential (primary) hypertension I25.10 Atherosclerotic heart disease of native coronary artery without angina pectoris Follow-up Appointments Return Appointment in 1 week. Bathing/ [employer] wounds with antibacterial soap and water. Anesthetic (Use 'Patient Medications' Section for Anesthetic Order Entry) Lidocaine applied to wound bed Off-Loading Gel wheelchair cushion Hospital bed/mattress - ETHOS Low air-loss mattress (Group 2) - HOSPITAL BED WITH GROUPE 2 AIR MATTRESS AND TRAPEZE BAR ETHOS Turn and reposition every 2 hours Additional Orders / Instructions Other: - apply calmoseptine or AandD ointment to left glut area daily for protection Wound Treatment Wound #5 - Gluteus Wound Laterality: Right, Midline Cleanser: Byram Ancillary Kit - 15 Day Supply (Generic) 3 x Per Week/30 Days Discharge Instructions: Use supplies as instructed; Kit contains: (15) Saline Bullets; (15) 3x3 Gauze; 15 pr Gloves Prim Dressing: Gauze 3 x Per Week/30 Days ary Discharge Instructions: moistened with Dakins Solution Secondary Dressing: Zetuvit Plus 4x4 (in/in) 3 x Per Week/30 Days Secured With: Medipore T - 20M Medipore H Soft Cloth Surgical T ape ape, 2x2 (in/yd) 3 x Per Week/30 Days Electronic Signature(s) Unsigned Entered By: [name] on 08/15/2023 14:08:59 Prescription 08/15/2023 -------------------------------------------------------------------------------- [name] PA-C Patient Name: Provider: 1965-11-04 [id number] Date of Birth: NPI#: [id number] Sex: DEA #: [phone] Phone #: License #: UPN: Patient Address[address] [facility] [address] [facility] [place], [place] [address] [address], [address] [place], [place] [address] [phone] Allergies Iodinated Contrast Media; Sulfa (Sulfonamide Antibiotics); latex; morphine [facility] [place], [place] ([id number]) [id number] Page 7 of 13 Hand Signature: Date(s): Prescription 08/15/2023 [name] PA-C Patient Name: Provider: 1966-01-15 [id number] Date of Birth: NPI#: [name] [id number] Sex: DEA #: [phone] Phone #: License #: UPN: Patient Address: [address] [facility] [address] [facility] [place], [place] [address] [address], [address] [place], [place] [address] [phone] Allergies Iodinated Contrast Media;  Sulfa (Sulfonamide Antibiotics); latex; morphine Provider's Orders Low air-loss mattress (Group 2) - HOSPITAL BED WITH GROUPE 2 AIR MATTRESS AND TRAPEZE BAR ETHOS Hand Signature: Date(s): Electronic Signature(s) Unsigned Entered By: [name] on 08/15/2023 14:08:59 -------------------------------------------------------------------------------- Problem List Details Patient Name: Date of ServiceEulas [name] [facility], [name] 08/15/2023 1:45 PM Medical Record Number: [id number] Patient Account Number: [id number] Date of Birth/Sex: Treating RN: Apr 09, 1966 (57 y.o. [name] Primary Care Provider: Aram [name] Other Clinician: Betha [name] Referring Provider: Treating Provider/Extender: [name] in Treatment: 19 Active Problems ICD-10 Encounter Code Description Active Date MDM Diagnosis M86.68 Other chronic osteomyelitis, other site 05/19/2023 No Yes L89.314 Pressure ulcer of right buttock, stage 4  03/31/2023 No Yes L89.323 Pressure ulcer of left buttock, stage 3 07/28/2023 No Yes Q76.0 Spina bifida occulta 03/31/2023 No Yes ABDULHAMID, UPPAL ([id number]) [id number] Page 8 of 13 Z93.3 Colostomy status 03/31/2023 No Yes I10 Essential (primary) hypertension 03/31/2023 No Yes I25.10 Atherosclerotic heart disease of native coronary artery without angina pectoris 03/31/2023 No Yes Inactive Problems Resolved Problems Electronic Signature(s) Signed: 08/15/2023 1:35:44 PM By: Allen Derry PA-C Entered By: Allen Derry on 08/15/2023 13:35:44 -------------------------------------------------------------------------------- Progress Note Details Patient Name: Date of Service: [name] TH, [name] 08/15/2023 1:45 PM Medical Record Number: [id number] Patient Account Number: [id number] Date of Birth/Sex: Treating RN: 1966-01-25 (57 y.o. [name] Primary Care Provider: Aram [name] Other Clinician: Betha [name] Referring Provider: Treating Provider/Extender: [name] in Treatment: 19 Subjective Chief Complaint Information obtained from Patient Right ischial tuberosity pressure ulcer with chronic osteomyelitis and left gluteal ulcer History of Present Illness (HPI) 57 year old male with a history of spina bifida presenting to [place] with a history of a open wound on the left gluteal region near his upper thigh which she's had for several weeks. He was seen in the ED recently at [facility] and was put on doxycycline and was advised some local care. his past medical history is significant for spinal bifid, neurogenic bladder, kidney stones, sacral pressure sores, constipation. Past surgical history significant for partial cystectomy, ileal conduit, VP shunt removal, percutaneous nephro lithotripsy. In the remote past the patient says he's had some treatment for a ulcer on this area and was treated with a skin  graft. Readmission: 10/16/2021 upon evaluation today patient appears to be doing somewhat poorly in regard to a fairly large wound noted over the right ischial tuberosity location. This is a stage IV pressure ulcer. Of note this is also positive for osteomyelitis upon a CT scan which was performed during the time that he was admitted in the hospital on 09/19/2021. With that being said it is noted in the right inferior buttock that he has a decubitus ulcer which extends to the posterior toe inferior right ischium where there is evidence of osteomyelitis. When he was here in the office actually missed that this was positive I missed read it and thought it said that there was no osteomyelitis. That is not the case there is in fact osteomyelitis noted here. I actually did review his notes as well in epic. Looking to the discharge summary it appears that the patient was admitted from 09/19/2021 through 09/23/2021. He was discharged home with home health care according to the note. With that being said from what I understand his sister actually takes care of him at this point. He was also to follow-up with a primary care provider and here at the wound care center within a  week. I am just now seeing him on the 13th almost a month after discharge. He does have a history of again osteomyelitis of this right hip/ischial tuberosity location. 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 The reason for his admission was actually worsening of the decubitus ulcer upon admission. He does have chronic osteomyelitis of this area. He was treated with empiric antibiotics he had acute kidney injury which improved with IV fluids he also had hypokalemia. Surgical debridement was performed by general surgery at that time and ID was consulted to assist with management according to  the notes. IV antibiotics were recommended but patient declined and wanted oral antibiotics at discharge. Therefore no IV antibiotics were planned for discharge time. This case was under the care of Dr. Joylene Draft while he was in the hospital when she did discharge him with a 2-week course of doxycycline and Augmentin according to the notes. It looks like Dr. Judithann Sheen office did call and leave a message for the patient to get scheduled for a follow-up visit after hospital discharge I do not see where he showed up in fact it appears that the patient has a no-show letter from Dr. Judithann Sheen office noted in epic due to a appointment that was missed on 10/06/2021. Looking at the pictures from Dr. Cristine Polio notes on [place], [place] ([id number]) [id number] Page 9 of 13 09/23/2021 it does appear that the wound is a little bit better as far as the cleanliness of the surface of the wound today but nonetheless still is a very significant wound. 10/26/2021 upon evaluation today patient appears to be doing okay in regard to his wound. Again this is a wound that he did indeed have osteomyelitis and previous. With that being said I do not see any signs of a worsening infection at this time which is good news. Overall I think that we are headed in the right direction although still I think he needs more aggressive offloading. 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 I do believe this would be ideal for him to be honest. Readmission: 01-15-2022 upon evaluation today patient presents for reevaluation he was last seen on October 26, 2021. At that time unfortunately he was having issues with a significant ischial pressure ulcer on the right ischial tuberosity. Subsequently he ended up having decent response to the Dakin's moistened gauze dressing that was previously being utilized and subsequently the patient's sister who is his primary caregiver as well states that she  decided just to try to manage this at home. Nonetheless unfortunately this is getting a little larger not better. He does spend a lot of the day in his chair. He does have an air mattress according what they tell me today. That was previously ordered. With that being said I do believe that at this time things do appear to be doing much better from the standpoint of infection I do not see any signs of overt infection. There is also no evidence of systemic infection. No fevers, chills, nausea, vomiting, or diarrhea. With that being said I do believe based on what I am seeing the wound is a bit cleaner and he possibly could be an excellent candidate for a wound VAC. 02-05-2022 upon evaluation today patient appears to be doing about the same in regard to his wound. Unfortunately  he is not any better but he also really has not had the wound VAC on sufficiently. There is been some confusion with the orders part of that I think is our follow-up with the way the order was written part of it may be with how the wound VAC is being placed either way we need to see what we can do to try to improve things in this regard. We will clarify the orders today going forward for home health. Unfortunately the patient does have a new wound today which is on the opposite side. This is not good 1 was bad enough have another area to open is definitely not a good thing. Its not as deep but nonetheless is also not very shallow either. We may potentially be able to get this to the point that we could bridge the 2 together in order to wound VAC both but right now want a focus on to try to get the wound VAC going for the main wound initially we will likely use Hydrofera Blue on the new wound.Marland Kitchen 02-19-2022 upon evaluation today patient's wound appears to be doing a little bit worse in the way of maceration fortunately there is no signs of active infection locally or systemically which is great news. No fevers, chills, nausea, vomiting,  or diarrhea. 03-05-2022 upon evaluation today patient unfortunately does not appear to be doing nearly as good in regard to his wounds as I would like to see. Fortunately I do not see any evidence of active infection that is obvious although I am can obtain a wound culture to ensure that there is not anything getting worse here as far as the wound without the wound VAC on his concern. With that being said I will go ahead and get this sent in and evaluated will initiate treatment as needed going forward if necessary. With regard to the wound with the wound VAC this continues to be placed directly over the wound and there is obvious signs of deep tissue injury. The suction being here means that he is sitting on it I think this may be part of the reason why it is coming off although not 100% certain. 03-12-2022 upon evaluation today patient still has not gotten the antibiotics that were called in for him 4 days ago. He tells me that his sister just told him this morning that they were ready but that she has not had a chance to go pick them up. Nonetheless I feel like that this is really something he needs any should have been taken that not the other antibiotics which were not doing the job for him to be honest. The patient states and I completely agree that there is really no way he would have known relying on his sister to let him know obviously. Nonetheless I do believe that he needs to not be taking any other antibiotics and be on the Levaquin as soon as possible. He tells me that she said she was going to pick them up today. In regard to home health I am hopeful that they actually have been bridging this now. I do think that the wound looks better and this is good news. With that being said I am not certain that I can really tell for sure how this was attached it was removed before he came in and the black foam left in place. I really want him to leave the wound VAC in place until he comes in so that we  can monitor and  make sure that this is being done correctly he voiced understanding. 03-19-2022 upon evaluation today and much happier with how things appear as far as the patient's wounds are concerned. I think he is on a much better track and they are doing a better job at bridging although its not quite where I like to see it is still more posterior which I think the patient needs to be a little bit more lateral on his side. Either way this is something that I did marked with a small dot today for him to tell the home health nurses well so she can bridge it to that location also think the tubing should go up instead of going down to just make it less cumbersome overall for him. 04-19-2022 upon evaluation today patient appears to be doing a little better in regard to his wounds. Fortunately I do not see any signs of infection I do believe he is on the right track. The wound VAC does seem to be helping to clean the wound up a bit and bring it in from the base at work. This is good news. 05-10-2022 upon evaluation today patient appears to be doing well currently in regard to his wounds in general in fact of the smaller of the 2 wounds without the wound VAC actually appears to be doing so much better this is almost completely closed and very pleased in that regard. He has been staying off of this he tells me and shows. With that being said the wound with the wound VAC is going require some sharp debridement there is some tissue which is not quite as healthy and we are going to go ahead and continue that for probably 2 more weeks although I think we may be closer to discontinuing this as well since it has healed and quite significantly. 05-24-2022 upon evaluation today patient appears to be doing well with regard to his wound in fact this looks better than I have been expected. This actually I think is a good time to go ahead and switch away from the wound VAC based on what I am seeing. I think we go to  [facility] with a bordered foam dressing. 06-21-2022 upon evaluation today patient appears to be doing well currently in regard to his wound this is measuring smaller and looks well. Fortunately there does not appear to be any signs of active infection locally or systemically at this time which is great news. No fevers, chills, nausea, vomiting, or diarrhea. 07-29-2022 upon evaluation today patient appears to be doing well currently in regard to his wound. This is actually measuring smaller although there is a lot of hypergranulation noted. Fortunately there does not appear to be any signs of active infection locally or systemically at this time. No fevers, chills, nausea, vomiting, or diarrhea. 11/9; pressure ulcer on the right buttock in the setting of spina bifida and lower extremity paralysis. He has been using [facility] 09-02-2022 upon evaluation today patient appears to be doing well currently in regard to his wound. We have been using Hydrofera Blue this is showing signs of improvement as far as getting smaller is concerned. There is some drainage around the edges of the wound but I think a lot of this is due to the fact he did not even have a dressing on that he came in today. 12/29; right buttock pressure area which was initially stage IV. This is in the setting of a patient with spina bifida spends most of his  time in a wheelchair. I am not certain how much he is offloading this. He has been using for [facility] as a primary dressing. When he was here the last visit he was felt to have hyper granulated tissue and was treated with silver nitrate chemical cauterization 10-14-2022 upon evaluation today patient appears to be doing poorly in regard to the wound in the right ischial location. Unfortunately he tells me that the dressing seems to be falling off frequently. Unfortunately I think that this has contributed to him developing an infection which appears to be exemplified  by the fact that he has bone exposed which is actually necrotic and working its way out of the wound up and have to perform a fairly significant debridement today it does appear. I think that he needs to have these dressings ensure that they are in place at all times. Also discussed with the patient that we will get a need to do some testing in order to see if he indeed has an infection. If he does have a bone infection which I think is pretty likely to be osteomyelitis of this ischial location. I discussed that with the patient in detail today and depending on what we see him and make any adjustments in care as far as going forward is concerned. Obviously depressed antibiotic that we gave him that he tells me was somewhat irritating as far as the stomach side effects are concerned. He states he really would not want to be on that again. Either way I feel he is probably going to be placed on something once we get the results of the culture back. I am honestly concerned about the fact that he comes into the clinic without any dressings in place I am not sure how often they are really staying where they need to be as far as this wound is concerned. I actually cannot remember seeing him anytime in the past 2 months or so at least that he came in [place], [place] ([id number]) [id number] Page 10 of 13 with a dressing intact. 10-21-2022 upon evaluation today patient presents for initial inspection here in our clinic concerning issues that he has been having with a wound in the sacral area. Again last time he was here I did debride away necrotic bone which was confirmed on pathology although it was not necessarily confirmed that this was secondary to osteomyelitis on the pathology report. Also the x-ray did not neither confirm nor deny the presence of osteomyelitis and his culture unfortunately showed multiple organisms with nothing predominance of this does not help to rule and  tailor down exactly what is going on here. Fortunately I do not see any signs of active infection systemically which is great news. With that being said locally I am definitely seeing some issues here. In fact this is warm to touch around the sacral area and I think that cellulitis is definitely present I just cannot pinpoint the exact organism. With that being said I did discuss with the patient as well today that I do believe that he probably needs an MRI in order to further quantify the extent of her likely osteomyelitis and this again is something that we are going to order this will need to be without contrast due to the fact that he is allergic to contrast dye. 11-18-2022 upon evaluation today patient's wound is actually showing some signs of improvement currently. We have been doing the Dakin's moistened gauze dressing which I think is helping to clean up  the wound for seeing some granulation growing and still the tissue is not quite as healthy as I would love to see but better than previous. He does have osteomyelitis but the good news is he seems to be treated well with the antibiotics he does have a refill that was just picked up and I think that he needs to continue to take that for certain. 3/7; patient is on a 6-week clinic hiatus. He lives at home with his sister however she is not at home all the time. He transfers out of bed and a sliding board. He claims he is only in the in the wheelchair about 4 5 hours. He does have underlying osteomyelitis I am not sure about the status of the antibiotics here. Part of the wound still easily probes to bone 12-23-2022 upon evaluation today patient appears to be doing well currently in regard to his wound all things considered there is still a lot of hypergranulation but I do feel like that with the Vashe moistened gauze is actually doing better than he was previously with the Hydrofera Blue. This just did not seem to be staying in place and with the  Vashe through able to change this more frequently. Readmission: 03-31-2023 this is a gentleman whom I have seen multiple times intermittently throughout the years compliance has been a real issue however. I do feel like there is some significant issues here still with the fact that dressing changes and keeping dressings in place are still pretty much as a significant problem here for [place]. I do not see any signs of active infection at this time which is great news but at the same time the wounds do not worse in fact he has another wound different than what I even noted previous. Subsequently he I do believe that the patient should continue to do monitor for any evidence of infection or worsening. Overall I think he needs to have regular follow-up visits with [place] and I think that he needs to have dressings in place at all times. He tells me that he is at home primarily by himself transferring himself from the first thing in the morning through at least 3 in the afternoon as his sister works he really needs somebody gets a caregiver with him throughout the day. 04-12-2023 upon evaluation today patient appears to be doing a little worse currently in regard to his wounds. He has been tolerating the dressing changes without complication. Fortunately I do not see any evidence of active infection locally nor systemically at this point. 04-21-2023 upon evaluation today patient's wound unfortunately appears to have purulent drainage and also there is a malodorous drainage at this point. Unfortunately I think that there is something going on here that does not seem to be doing nearly as good as what we would like to see. I think that he is likely going to require some antibiotics. He has taken Cipro before without complication. 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 On 7/18 he had purulent drainage he had a PCR culture and is now on Cipro he is tolerating this well. Using  Hydrofera Blue's a 2 bit and border foam his sister is changing the dressing After his sister goes to work the patient is apparently in a wheelchair all day. There is no way to heal the wound like this with that degree of prolonged pressure. 05-12-2023 upon evaluation today patient unfortunately appears to be doing much  worse than what we previously seen. Last time he was here he was seen by Dr. Leanord Hawking. This was shortly after I put him on Cipro he was tolerating that well. Nonetheless he did not show up last week apparently there was a transportation issue. This week upon arrival this wound appears to be doing significantly worse there is a significant malodorous drainage and this is deeper he has erythema in the gluteal region around it appears to be infected and to be honest despite the Cipro and oral antibiotics he seems to be doing worse not better. 05-19-2023 upon evaluation patient appears to be doing better than last time I saw him with regard to his wound in the right gluteal location. Fortunately there does not appear to be any signs of systemic infection though he does have evidence of osteomyelitis noted which were acute on chronic and that was the findings that were noted in the hospital when he was admitted most recently after I sent him from May 12, 2023 through May 17, 2023. The patient was treated with IV Zosyn and bank while in the hospital and discharged on Augmentin although that has not been picked up yet it has been 2 days since he was discharged he states that "the pharmacy never called his sister to let her know it was ready". Nonetheless this needs to be gotten ASAP to make sure that he is fully treated as far as the infection is concerned I discussed that with him today as well. 05-26-2023 upon evaluation today patient appears to be doing better today with regard to his wounds compared to what we have noticed previous. Fortunately I do not see any evidence of active infection  locally or systemically which is great news and in general I do believe that we are making headway towards complete closure. With that being said we still had some trouble getting the order done for the trapeze and aids to help him with regard to his daily activities and preventing the dressings from coming off. We have reached out to his CAP Worker and have not gotten in touch with her as of yet. I did recommend that the patient try to see. He or his sister to get in touch with [facility] and see if they can help [place] in that regard. 8/29; patient with 2 wounds in close juxtaposition on the right buttock. Both of these probes very close to the bone especially the area over the ischial tuberosity. Patient was recently in the hospital for additional antibiotics I did not look at this today. We are using Dakin's wet to dry. His sister changes in dressings. The wounds are measuring larger. 06-16-2023 upon evaluation today patient appears to be doing worse currently at this time. Fortunately I do not see any signs of infection which is good news but at the same time he does have deep tissue injury which has me concerned about potential further breakdown here. He does tell me that he has been up on it a little bit more. 07-12-2023 upon evaluation today patient appears to be doing okay in regard to his wound there is actually an area that is merged between the 2 and therefore this is really just 1 wound we converted it as such. There is no separation between the 2. I knew that this was going to happen it was just a matter of time. With that being said I do not see any signs of infection obvious at this point but I do believe that this  is still an issue with him keeping the dressing in place he tells me it is not staying in place which does have me concerned as well. Fortunately I do not see any evidence of worsening overall systemically though locally I am a little concerned about how the wounds have digressed  since I last saw him. 07-28-2023 upon evaluation today patient unfortunately appears to be doing worse with a new wound at this point on the left side that was not there last time I saw him. He has had missed visits in the past 4 visits he has missed 2 of those unfortunately. This again was a condition about seeing him back that he had to make sure that he showed up for all visits. That is not happening at the moment and I have discussion with him today about that. I discussed that if he is not going to show up for his visits that we are going to have to discharge him that was the original agreement and that is still where things stand at this point to be honest. The patient voiced understanding. I told him that we are going to give him 1 more shot today before we would proceed to anything like that but again if he is not coming to the appointments as directed that there is really no point and has continued to have them on the books as far as trying to get this healed is concerned. He voiced understanding. He still tells me that his dressings were also not staying in place in fact he tells me that every time he gets out of the bed the dressing is already off. He came in today with no dressing on. Noncompliance has been a significant issue through the course of the treatment with this gentleman and still continues to be so. 08-04-2023 upon evaluation today patient's wound actually appears to be doing better than it has for quite some time. I am actually seeing some improvement which is good news and in general I do believe that we will make an some headway here. I do not see any signs of obvious infection there is also no need for obvious debridement today. Overall I think that we are on the right track. He did actually have a dressing in place upon arrival today. Murphy, [name] ([id number]) [id number] Page 11 of 13 08-15-2023 upon evaluation today patient appears to be  doing well currently in regard to his wound. He actually looks much better he has been in the hospital since I last saw him. He tells me that his sister got concerned about the appearance of his wound he was there where they did aggressive offloading and put him on IV antibiotics as well he seems to be doing much better. Objective Constitutional Well-nourished and well-hydrated in no acute distress. Vitals Time Taken: 1:39 PM, Weight: 90 lbs, Temperature: 98.0 F, Pulse: 102 bpm, Respiratory Rate: 18 breaths/min, Blood Pressure: 102/64 mmHg. Respiratory normal breathing without difficulty. Psychiatric this patient is able to make decisions and demonstrates good insight into disease process. Alert and Oriented x 3. pleasant and cooperative. General Notes: Upon inspection patient's wound bed actually showed signs of good granulation epithelization at this point. Fortunately I do not see any signs of active infection at this time which is great news and overall I am extremely pleased with where we stand. No fevers, chills, nausea, vomiting, or diarrhea. Integumentary (Hair, Skin) Wound #5 status is Open. Original cause of wound was Gradually Appeared. The date acquired was:  10/04/2021. The wound has been in treatment 19 weeks. The wound is located on the Right,Midline Gluteus. The wound measures 5cm length x 9.5cm width x 1.4cm depth; 37.306cm^2 area and 52.229cm^3 volume. There is bone, muscle, and Fat Layer (Subcutaneous Tissue) exposed. There is tunneling at 8:00 with a maximum distance of 2.4cm. There is a large amount of serosanguineous drainage noted. There is medium (34-66%) red, pink granulation within the wound bed. There is a medium (34-66%) amount of necrotic tissue within the wound bed. Wound #7 status is Healed - Epithelialized. Original cause of wound was Pressure Injury. The date acquired was: 07/28/2023. The wound has been in treatment 2 weeks. The wound is located on the Left Gluteus.  The wound measures 0cm length x 0cm width x 0cm depth; 0cm^2 area and 0cm^3 volume. There is Fat Layer (Subcutaneous Tissue) exposed. There is no tunneling or undermining noted. There is a none present amount of drainage noted. The wound margin is distinct with the outline attached to the wound base. There is no granulation within the wound bed. There is no necrotic tissue within the wound bed. Assessment Active Problems ICD-10 Other chronic osteomyelitis, other site Pressure ulcer of right buttock, stage 4 Pressure ulcer of left buttock, stage 3 Spina bifida occulta Colostomy status Essential (primary) hypertension Atherosclerotic heart disease of native coronary artery without angina pectoris Procedures Wound #5 Pre-procedure diagnosis of Wound #5 is a Pressure Ulcer located on the Right,Midline Gluteus . There was a Excisional Skin/Subcutaneous Tissue Debridement with a total area of 37.29 sq cm performed by Allen Derry, PA-C. With the following instrument(s): Curette to remove Viable and Non-Viable tissue/material. Material removed includes Subcutaneous Tissue, Slough, and Biofilm. A time out was conducted at 14:03, prior to the start of the procedure. A Minimum amount of bleeding was controlled with Pressure. The procedure was tolerated well. [name] Debridement Measurements: 5cm length x 9.5cm width x 1.4cm depth; 52.229cm^3 volume. [name] debridement Stage noted as Category/Stage IV. Character of Wound/Ulcer [name] Debridement is stable. [name] procedure Diagnosis Wound #5: Same as Pre-Procedure Plan Follow-up Appointments: Return Appointment in 1 week. Bathing/ Shower/ Hygiene: Wash wounds with antibacterial soap and water. Anesthetic (Use 'Patient Medications' Section for Anesthetic Order Entry): KINSLER, SHRIDER ([id number]) [id number] Page 12 of 13 Lidocaine applied to wound bed Off-Loading: Gel wheelchair cushion Hospital bed/mattress - ETHOS Low  air-loss mattress (Group 2) - HOSPITAL BED WITH GROUPE 2 AIR MATTRESS AND TRAPEZE BAR [facility] Turn and reposition every 2 hours Additional Orders / Instructions: Other: - apply calmoseptine or AandD ointment to left glut area daily for protection WOUND #5: - Gluteus Wound Laterality: Right, Midline Cleanser: [employer]ram Ancillary Kit - 15 Day Supply (Generic) 3 x Per Week/30 Days Discharge Instructions: Use supplies as instructed; Kit contains: (15) Saline Bullets; (15) 3x3 Gauze; 15 pr Gloves Prim Dressing: Gauze 3 x Per Week/30 Days ary Discharge Instructions: moistened with Dakins Solution Secondary Dressing: Zetuvit Plus 4x4 (in/in) 3 x Per Week/30 Days Secured With: Medipore T - 76M Medipore H Soft Cloth Surgical T ape ape, 2x2 (in/yd) 3 x Per Week/30 Days 1. I am going to recommend that we have the patient continue to monitor for any signs of infection or worsening. Based on what I am seeing I do believe that her making good headway here towards closure. 2. I would recommend as well that the patient should continue with the Dakin's moistened gauze dressing which does seem to be doing quite well. 3. I am also can recommend that he  should continue with the Zetuvit to cover or ABD pad secured with tape. We will see patient back for reevaluation in 1 week here in the clinic. If anything worsens or changes patient will contact our office for additional recommendations. Electronic Signature(s) Signed: 08/15/2023 2:14:39 PM By: Allen Derry PA-C Entered By: Allen Derry on 08/15/2023 14:14:39 -------------------------------------------------------------------------------- SuperBill Details Patient Name: Date of Service: [name] TH, [name] 08/15/2023 Medical Record Number: [id number] Patient Account Number: [id number] Date of Birth/Sex: Treating RN: May 31, 1966 (57 y.o. [name]) [name] Primary Care Provider: Aram [name] Other Clinician: Betha [name] Referring Provider: Treating  Provider/Extender: [name] in Treatment: 19 Diagnosis Coding ICD-10 Codes Code Description [phone] Other chronic osteomyelitis, other site L89.314 Pressure ulcer of right buttock, stage 4 L89.323 Pressure ulcer of left buttock, stage 3 Q76.0 Spina bifida occulta Z93.3 Colostomy status I10 Essential (primary) hypertension I25.10 Atherosclerotic heart disease of native coronary artery without angina pectoris Facility Procedures : CPT4 Code: [id number] Description: 11042 - DEB SUBQ TISSUE 20 SQ CM/< ICD-10 Diagnosis Description L89.314 Pressure ulcer of right buttock, stage 4 Modifier: Quantity: 1 : [facility] CPT4 Code: [id number] H, Buster ([id number]) Description: 11045 - DEB SUBQ TISS EA ADDL 20CM ICD-10 Diagnosis Description L89.314 Pressure ulcer of right buttock, stage 4 [id number]: [id number] Page 13 of Quantity: 1 13 Physician Procedures : CPT4 Code Description Modifier [id number] 11042 - WC PHYS SUBQ TISS 20 SQ CM ICD-10 Diagnosis Description L89.314 Pressure ulcer of right buttock, stage 4 Quantity: 1 : [id number] 11045 - WC PHYS SUBQ TISS EA ADDL 20 CM ICD-10 Diagnosis Description L89.314 Pressure ulcer of right buttock, stage 4 Quantity: 1 Electronic Signature(s) Signed: 08/15/2023 2:15:03 PM By: Allen Derry PA-C Entered By: Allen Derry on 08/15/2023 14:15:02

## 2023-08-15 NOTE — Progress Notes (Signed)
[place], [name] ([id number]) [id number] Page 1 of 9 Visit Report for 08/15/2023 Arrival Information Details Patient Name: Date of Service: [name] [facility], DO [place] LD 08/15/2023 1:45 PM Medical Record Number: [id number] Patient Account Number: [id number] Date of Birth/Sex: Treating RN: 28-Jul-1966 (57 y.o. [name]) [name] Primary Care Keyatta Tolles: Aram Beecham Other Clinician: Betha Loa Referring Shalimar Mcclain: Treating Rozalyn Osland/Extender: Gabriel Earing in Treatment: 19 Visit Information History Since Last Visit All ordered tests and consults were completed: No Patient Arrived: Wheel Chair Added or deleted any medications: No Arrival Time: 13:38 Any new allergies or adverse reactions: No Transfer Assistance: Transfer Board Had a fall or experienced change in No Patient Identification Verified: Yes activities of daily living that may affect Secondary Verification Process Completed: Yes risk of falls: Patient Requires Transmission-Based Precautions: No Signs or symptoms of abuse/neglect since last visito No Patient Has Alerts: No Hospitalized since last visit: No Implantable device outside of the clinic excluding No cellular tissue based products placed in the center since last visit: Has Dressing in Place as Prescribed: Yes Pain Present Now: No Electronic Signature(s) Unsigned Entered ByBetha Loa on 08/15/2023 13:39:13 -------------------------------------------------------------------------------- Clinic Level of Care Assessment Details Patient Name: Date of ServiceEulas [name] [facility], [name] 08/15/2023 1:45 PM Medical Record Number: [id number] Patient Account Number: [id number] Date of Birth/Sex: Treating RN: Feb 04, 1966 (57 y.o. [name] Primary Care Solon Alban: Aram Beecham Other Clinician: Betha Loa Referring Taraji Mungo: Treating [name]/Extender: Gabriel Earing in Treatment: 19 Clinic Level  of Care Assessment Items TOOL 1 Quantity Score []  - 0 Use when EandM and Procedure is performed on INITIAL visit ASSESSMENTS - Nursing Assessment / Reassessment []  - 0 General Physical Exam (combine w/ comprehensive assessment (listed just below) when performed on new pt. evalsSallyanne Havers, [name] ([id number]) [id number] Page 2 of 9 []  - 0 Comprehensive Assessment (HX, ROS, Risk Assessments, Wounds Hx, etc.) ASSESSMENTS - Wound and Skin Assessment / Reassessment []  - 0 Dermatologic / Skin Assessment (not related to wound area) ASSESSMENTS - Ostomy and/or Continence Assessment and Care []  - 0 Incontinence Assessment and Management []  - 0 Ostomy Care Assessment and Management (repouching, etc.) PROCESS - Coordination of Care []  - 0 Simple Patient / Family Education for ongoing care []  - 0 Complex (extensive) Patient / Family Education for ongoing care []  - 0 Staff obtains Chiropractor, Records, T Results / Process Orders est []  - 0 Staff telephones HHA, Nursing Homes / Clarify orders / etc []  - 0 Routine Transfer to another Facility (non-emergent condition) []  - 0 Routine Hospital Admission (non-emergent condition) []  - 0 New Admissions / Manufacturing engineer / Ordering NPWT Apligraf, etc. , []  - 0 Emergency Hospital Admission (emergent condition) PROCESS - Special Needs []  - 0 Pediatric / Minor Patient Management []  - 0 Isolation Patient Management []  - 0 Hearing / Language / Visual special needs []  - 0 Assessment of Community assistance (transportation, D/C planning, etc.) []  - 0 Additional assistance / Altered mentation []  - 0 Support Surface(s) Assessment (bed, cushion, seat, etc.) INTERVENTIONS - Miscellaneous []  - 0 External ear exam []  - 0 Patient Transfer (multiple staff / Nurse, adult / Similar devices) []  - 0 Simple Staple / Suture removal (25 or less) []  - 0 Complex Staple / Suture removal (26 or more) []  -  0 Hypo/Hyperglycemic Management (do not check if billed separately) []  - 0 Ankle / Brachial Index (ABI) - do not check if billed separately Has the patient been seen at  the hospital within the last three years: Yes Total Score: 0 Level Of Care: ____ Electronic Signature(s) Unsigned Entered By: Betha Loa on 08/15/2023 14:05:38 -------------------------------------------------------------------------------- Lower Extremity Assessment Details Patient Name: Date of ServiceEulas [name] [facility], [name] 08/15/2023 1:45 PM Medical Record Number: [id number] Patient Account Number: [id number] JOSPEH, [name] ([id number]) [id number] Page 3 of 9 Date of Birth/Sex: Treating RN: [birth year] (57 y.o. [name]) [name] Primary Care Chenae Brager: Aram Beecham Other Clinician: Betha Loa Referring Kyoko Elsea: Treating Jeanell Mangan/Extender: Gabriel Earing in Treatment: 19 Electronic Signature(s) Unsigned Entered By: Betha Loa on 08/15/2023 13:53:51 -------------------------------------------------------------------------------- Multi Wound Chart Details Patient Name: Date of Service: [name] [facility], [name] 08/15/2023 1:45 PM Medical Record Number: [id number] Patient Account Number: [id number] Date of Birth/Sex: Treating RN: [birth year] (57 y.o. [name]) [name] Primary Care Ashe Graybeal: Aram Beecham Other Clinician: Betha Loa Referring Loriana Samad: Treating [name]/Extender: Gabriel Earing in Treatment: 19 Vital Signs Height(in): Pulse(bpm): 102 Weight(lbs): 90 Blood Pressure(mmHg): 102/64 Body Mass Index(BMI): Temperature(F): 98.0 Respiratory Rate(breaths/min): 18 [5:Photos:] [N/A:N/A] Right, Midline Gluteus Left Gluteus N/A Wound Location: Gradually Appeared Pressure Injury N/A Wounding Event: Pressure Ulcer Pressure Ulcer N/A Primary Etiology: Cataracts, Glaucoma, Coronary Artery Cataracts, Glaucoma, Coronary  Artery N/A Comorbid History: Disease, Hypertension, History of Disease, Hypertension, History of pressure wounds, Osteoarthritis, pressure wounds, Osteoarthritis, Paraplegia Paraplegia 10/04/2021 07/28/2023 N/A Date Acquired: 19 2 N/A Weeks of Treatment: Open Open N/A Wound Status: No No N/A Wound Recurrence: 5x9.5x1.4 0.1x0.1x0.1 N/A Measurements L x W x D (cm) 37.306 0.008 N/A A (cm) : rea 52.229 0.001 N/A Volume (cm) : -280.00% 99.30% N/A % Reduction in A rea: -254.70% 99.20% N/A % Reduction in Volume: 8 Position 1 (o'clock): 2.4 Maximum Distance 1 (cm): Yes N/A N/A Tunneling: Category/Stage IV Category/Stage III N/A Classification: Large Medium N/A Exudate A mount: Serosanguineous Serosanguineous N/A Exudate Type: red, brown red, brown N/A Exudate Color: N/A Distinct, outline attached N/A Wound Margin: Medium (34-66%) Medium (34-66%) N/A Granulation A mountFatima Sanger ([id number]) [id number] Page 4 of 9 Red, Pink Pink N/A Granulation Quality: Medium (34-66%) Medium (34-66%) N/A Necrotic Amount: Fat Layer (Subcutaneous Tissue): Yes Fat Layer (Subcutaneous Tissue): Yes N/A Exposed Structures: Muscle: Yes Fascia: No Bone: Yes Tendon: No Fascia: No Muscle: No Tendon: No Joint: No Joint: No Bone: No None None N/A Epithelialization: Treatment Notes Electronic Signature(s) Unsigned Entered ByBetha Loa on 08/15/2023 13:53:56 -------------------------------------------------------------------------------- Multi-Disciplinary Care Plan Details Patient Name: Date of ServiceEulas [name] [facility], [name] 08/15/2023 1:45 PM Medical Record Number: [id number] Patient Account Number: [id number] Date of Birth/Sex: Treating RN: 1965/10/05 (57 y.o. [name]) [name] Primary Care Kekoa Fyock: Aram Beecham Other Clinician: Betha Loa Referring Marriah Sanderlin: Treating Adyson Vanburen/Extender: Gabriel Earing in  Treatment: 19 Active Inactive Pressure Nursing Diagnoses: Potential for impaired tissue integrity related to pressure, friction, moisture, and shear Goals: Patient will remain free from development of additional pressure ulcers Date Initiated: 03/31/2023 Target Resolution Date: 07/01/2023 Goal Status: Active Interventions: Assess: immobility, friction, shearing, incontinence upon admission and as needed Assess offloading mechanisms upon admission and as needed Assess potential for pressure ulcer upon admission and as needed Notes: Wound/Skin Impairment Nursing Diagnoses: Knowledge deficit related to ulceration/compromised skin integrity Goals: Patient/caregiver will verbalize understanding of skin care regimen Date Initiated: 03/31/2023 Target Resolution Date: 07/01/2023 Goal Status: Active Ulcer/skin breakdown will have a volume reduction of 30% by week 4 Date Initiated: 03/31/2023 Date Inactivated: 06/02/2023 Target Resolution Date: 05/31/2023 Goal Status: Unmet Unmet Reason: comorbidities Ulcer/skin breakdown  will have a volume reduction of 50% by week 8 Date Initiated: 03/31/2023 Target Resolution Date: 07/01/2023 DEAUNDRE, [place] ([id number]) [id number] Page 5 of 9 Goal Status: Active Ulcer/skin breakdown will have a volume reduction of 80% by week 12 Date Initiated: 03/31/2023 Target Resolution Date: 07/31/2023 Goal Status: Active Ulcer/skin breakdown will heal within 14 weeks Date Initiated: 03/31/2023 Target Resolution Date: 08/31/2023 Goal Status: Active Interventions: Assess patient/caregiver ability to obtain necessary supplies Assess patient/caregiver ability to perform ulcer/skin care regimen upon admission and as needed Assess ulceration(s) every visit Notes: Electronic Signature(s) Unsigned Entered By: Betha Loa on 08/15/2023 14:07:57 -------------------------------------------------------------------------------- Pain Assessment  Details Patient Name: Date of ServiceEulas [name] [facility], [name] 08/15/2023 1:45 PM Medical Record Number: [id number] Patient Account Number: [id number] Date of Birth/Sex: Treating RN: 1966-03-02 (57 y.o. [name] Primary Care Shameka Aggarwal: Aram Beecham Other Clinician: Betha Loa Referring Kristyna Bradstreet: Treating Lilian Fuhs/Extender: Gabriel Earing in Treatment: 19 Active Problems Location of Pain Severity and Description of Pain Patient Has Paino No Site Locations Pain Management and Medication Current Pain Management: Electronic Signature(s) Unsigned Entered By: Betha Loa on 08/15/2023 13:52:15 [name] ([id number]) [id number] Page 6 of 9 -------------------------------------------------------------------------------- Patient/Caregiver Education Details Patient Name: Date of Service: [name] [facility], DO [place] LD 11/11/2024andnbsp1:45 PM Medical Record Number: [id number] Patient Account Number: [id number] Date of Birth/Gender: Treating RN: 12-29-1965 (57 y.o. [name]) [name] Primary Care Physician: Aram Beecham Other Clinician: Betha Loa Referring Physician: Treating Physician/Extender: Gabriel Earing in Treatment: 19 Education Assessment Education Provided To: Patient Education Topics Provided Wound/Skin Impairment: Handouts: Other: continue wound care as directed Methods: Explain/Verbal Responses: State content correctly Electronic Signature(s) Unsigned Entered By: Betha Loa on 08/15/2023 14:08:27 -------------------------------------------------------------------------------- Wound Assessment Details Patient Name: Date of ServiceEulas [name] [facility], [name] 08/15/2023 1:45 PM Medical Record Number: [id number] Patient Account Number: [id number] Date of Birth/Sex: Treating RN: [birth year] (57 y.o. [name] Primary Care Khalid Lacko: Aram Beecham Other Clinician: Betha Loa Referring Nava Song: Treating Devinn Hurwitz/Extender: Hermine Messick Weeks in Treatment: 19 Wound Status Wound Number: 5 Primary Pressure Ulcer Etiology: Wound Location: Right, Midline Gluteus Wound Open Wounding Event: Gradually Appeared Status: Date Acquired: 10/04/2021 Comorbid Cataracts, Glaucoma, Coronary Artery Disease, Hypertension, Weeks Of Treatment: 19 History: History of pressure wounds, Osteoarthritis, Paraplegia Clustered Wound: No [name] ([id number]) [id number] Page 7 of 9 Photos Wound Measurements Length: (cm) 5 Width: (cm) 9.5 Depth: (cm) 1.4 Area: (cm) 37.306 Volume: (cm) 52.229 % Reduction in Area: -280% % Reduction in Volume: -254.7% Epithelialization: None Tunneling: Yes Position (o'clock): 8 Maximum Distance: (cm) 2.4 Wound Description Classification: Category/Stage IV Exudate Amount: Large Exudate Type: Serosanguineous Exudate Color: red, brown Foul Odor After Cleansing: No Slough/Fibrino Yes Wound Bed Granulation Amount: Medium (34-66%) Exposed Structure Granulation Quality: Red, Pink Fascia Exposed: No Necrotic Amount: Medium (34-66%) Fat Layer (Subcutaneous Tissue) Exposed: Yes Tendon Exposed: No Muscle Exposed: Yes Necrosis of Muscle: No Joint Exposed: No Bone Exposed: Yes Electronic Signature(s) Unsigned Entered ByBetha Loa on 08/15/2023 13:53:08 -------------------------------------------------------------------------------- Wound Assessment Details Patient Name: Date of ServiceEulas [name] [facility], [name] 08/15/2023 1:45 PM Medical Record Number: [id number] Patient Account Number: [id number] Date of Birth/Sex: Treating RN: October 03, 1966 (57 y.o. [name] Primary Care Jessieca Rhem: Aram Beecham Other Clinician: Betha Loa Referring Talal Fritchman: Treating Rahm Minix/Extender: Hermine Messick Weeks in Treatment: 19 Wound Status Wound Number: 7 Primary Pressure  Ulcer Etiology: Wound Location: Left Gluteus Wound Healed - Epithelialized Wounding Event: Pressure Injury Status: Date  Acquired: 07/28/2023 Comorbid Cataracts, Glaucoma, Coronary Artery Disease, Hypertension, Weeks Of Treatment: 2 DAEVEON, CAMILLI ([id number]) [id number] Page 8 of 9 Weeks Of Treatment: 2 History: History of pressure wounds, Osteoarthritis, Paraplegia Clustered Wound: No Photos Wound Measurements Length: (cm) Width: (cm) Depth: (cm) Area: (cm) Volume: (cm) 0 % Reduction in Area: 100% 0 % Reduction in Volume: 100% 0 Epithelialization: Large (67-100%) 0 Tunneling: No 0 Undermining: No Wound Description Classification: Category/Stage III Wound Margin: Distinct, outline attached Exudate Amount: None Present Foul Odor After Cleansing: No Slough/Fibrino No Wound Bed Granulation Amount: None Present (0%) Exposed Structure Necrotic Amount: None Present (0%) Fascia Exposed: No Fat Layer (Subcutaneous Tissue) Exposed: Yes Tendon Exposed: No Muscle Exposed: No Joint Exposed: No Bone Exposed: No Electronic Signature(s) Unsigned Entered ByBetha Loa on 08/15/2023 14:03:21 -------------------------------------------------------------------------------- Vitals Details Patient Name: Date of Service: FA [facility], [name] 08/15/2023 1:45 PM Medical Record Number: [id number] Patient Account Number: [id number] Date of Birth/Sex: Treating RN: [birth year] (57 y.o. [name] Primary Care Ranulfo Kall: Aram Beecham Other Clinician: Betha Loa Referring Quinlan Vollmer: Treating Cadince Hilscher/Extender: Gabriel Earing in Treatment: 19 Vital Signs Time Taken: 13:39 Temperature (F): 98.0 Weight (lbs): 90 Pulse (bpm): 102 Respiratory Rate (breaths/min): 18 Blood Pressure (mmHg): 102/64 Reference Range: 80 - 120 mg / dl [place], [name] ([id number]) [id number] Page 9 of 9 Electronic  Signature(s) Unsigned Entered By: Betha Loa on 08/15/2023 13:51:09 Signature(s): Date(s):

## 2023-08-29 ENCOUNTER — Encounter: Payer: 59 | Admitting: Physician Assistant

## 2023-08-29 DIAGNOSIS — L89314 Pressure ulcer of right buttock, stage 4: Secondary | ICD-10-CM | POA: Diagnosis not present

## 2023-08-29 NOTE — Progress Notes (Signed)
[place], [name] ([id number]) [id number] Page 1 of 5 Visit Report for 08/29/2023 Chief Complaint Document Details Patient Name: Date of Service: [name] [facility], [name] 08/29/2023 1:45 PM Medical Record Number: [id number] Patient Account Number: [id number] Date of Birth/Sex: Treating RN: 05/22/1966 (57 y.o. [name]) [name] Primary Care Provider: Aram Beecham Other Clinician: Betha Loa Referring Provider: Treating Provider/Extender: Gabriel Earing in Treatment: 21 Information Obtained from: Patient Chief Complaint Right ischial tuberosity pressure ulcer with chronic osteomyelitis and left gluteal ulcer Electronic Signature(s) Signed: 08/29/2023 1:52:56 PM By: Allen Derry PA-C Entered By: Allen Derry on 08/29/2023 13:52:56 -------------------------------------------------------------------------------- Debridement Details Patient Name: Date of Service: [name] [facility], [name] 08/29/2023 1:45 PM Medical Record Number: [id number] Patient Account Number: [id number] Date of Birth/Sex: Treating RN: [birth year] (57 y.o. [name] Primary Care Provider: Aram Beecham Other Clinician: Betha Loa Referring Provider: Treating Provider/Extender: Gabriel Earing in Treatment: 21 Debridement Performed for Assessment: Wound #5 Right,Midline Gluteus Performed By: Physician Allen Derry, PA-C The following information was scribed by: Betha Loa The information was scribed for: Allen Derry Debridement Type: Debridement Level of Consciousness (Pre-procedure): Awake and Alert Pre-procedure Verification/Time Out Yes - 14:19 Taken: Start Time: 14:19 Percent of Wound Bed Debrided: 100% T Area Debrided (cm): otal 43.96 Tissue and other material debrided: Viable, Non-Viable, Slough, Subcutaneous, Slough Level: Skin/Subcutaneous Tissue Debridement Description: Excisional Instrument: Curette Bleeding:  Minimum Hemostasis Achieved: Pressure Response to Treatment: Procedure was tolerated well Level of Consciousness Joenathan Bonaventura, [name] ([id number]) [id number] Page 2 of 5 Level of Consciousness ([name] Awake and Alert procedure): [name] Debridement Measurements of Total Wound Length: (cm) 7 Stage: Category/Stage IV Width: (cm) 8 Depth: (cm) 1.6 Volume: (cm) 70.372 Character of Wound/Ulcer [name] Debridement: Stable [name] Procedure Diagnosis Same as Pre-procedure Electronic Signature(s) Unsigned Entered ByBetha Loa on 08/29/2023 14:20:36 -------------------------------------------------------------------------------- Physician Orders Details Patient Name: Date of ServiceEulas [name] [facility], [name] 08/29/2023 1:45 PM Medical Record Number: [id number] Patient Account Number: [id number] Date of Birth/Sex: Treating RN: 15-Aug-1966 (57 y.o. [name]) [name] Primary Care Provider: Aram Beecham Other Clinician: Betha Loa Referring Provider: Treating Provider/Extender: Gabriel Earing in Treatment: 21 The following information was scribed by: Betha Loa The information was scribed for: Allen Derry Verbal / Phone Orders: No Diagnosis Coding ICD-10 Coding Code Description M86.68 Other chronic osteomyelitis, other site L89.314 Pressure ulcer of right buttock, stage 4 L89.323 Pressure ulcer of left buttock, stage 3 Q76.0 Spina bifida occulta Z93.3 Colostomy status I10 Essential (primary) hypertension I25.10 Atherosclerotic heart disease of native coronary artery without angina pectoris Follow-up Appointments Return Appointment in 1 week. Bathing/ [employer] wounds with antibacterial soap and water. Anesthetic (Use 'Patient Medications' Section for Anesthetic Order Entry) Lidocaine applied to wound bed Off-Loading Gel wheelchair cushion Hospital bed/mattress - ETHOS Low air-loss mattress (Group 2) - HOSPITAL BED  WITH GROUPE 2 AIR MATTRESS AND TRAPEZE BAR ETHOS Turn and reposition every 2 hours Additional Orders / Instructions HASKER, DUNSTON ([id number]) [id number] Page 3 of 5 Other: - apply calmoseptine or AandD ointment to left glut area daily for protection Wound Treatment Wound #5 - Gluteus Wound Laterality: Right, Midline Cleanser: Byram Ancillary Kit - 15 Day Supply (Generic) 3 x Per Week/30 Days Discharge Instructions: Use supplies as instructed; Kit contains: (15) Saline Bullets; (15) 3x3 Gauze; 15 pr Gloves Prim Dressing: Gauze 3 x Per Week/30 Days ary Discharge Instructions: moistened with Dakins Solution Secondary Dressing: Zetuvit Plus 4x4 (in/in) 3 x Per Week/30  Days Secured With: Medipore T - 69M Medipore H Soft Cloth Surgical T ape ape, 2x2 (in/yd) 3 x Per Week/30 Days Electronic Signature(s) Unsigned Entered By: Betha Loa on 08/29/2023 14:22:14 Prescription 08/29/2023 -------------------------------------------------------------------------------- Chase Pugh PA-C Patient Name: Provider: Aug 31, 1966 [id number] Date of Birth: NPI#: [id number] Sex: DEA #: [phone] Phone #: License #: UPN: Patient Address: [address] [facility] [address] [facility] [place], [place] [address] [address] [place], [place] [address] [phone] Allergies Iodinated Contrast Media; Sulfa (Sulfonamide Antibiotics); latex; morphine Provider's Orders Hospital bed/mattress - ETHOS Hand Signature: Date(s): Prescription 08/29/2023 Chase Pugh PA-C Patient Name: Provider: 02-20-1966 [id number] Date of Birth: NPI#: [name] [id number] Sex: DEA #: [phone] Phone #: License #: UPN: Patient Address: [address] [facility] [address] [facility] [place], [place]  [address] [address], [address] [place], [place] [address] [phone] Allergies Iodinated Contrast Media; Sulfa (Sulfonamide Antibiotics); latex; morphine Chase, [name] ([id number]) [id number] Page 4 of 5 Provider's Orders Low air-loss mattress (Group 2) - HOSPITAL BED WITH GROUPE 2 AIR MATTRESS AND TRAPEZE BAR ETHOS Hand Signature: Date(s): Electronic Signature(s) Unsigned Entered By: Betha Loa on 08/29/2023 14:22:14 -------------------------------------------------------------------------------- Problem List Details Patient Name: Date of ServiceEulas [name] [facility], [name] 08/29/2023 1:45 PM Medical Record Number: [id number] Patient Account Number: [id number] Date of Birth/Sex: Treating RN: 31-Oct-1965 (57 y.o. [name] Primary Care Provider: Aram Beecham Other Clinician: Betha Loa Referring Provider: Treating Provider/Extender: Gabriel Earing in Treatment: 21 Active Problems ICD-10 Encounter Code Description Active Date MDM Diagnosis M86.68 Other chronic osteomyelitis, other site 05/19/2023 No Yes L89.314 Pressure ulcer of right buttock, stage 4 03/31/2023 No Yes L89.323 Pressure ulcer of left buttock, stage 3 07/28/2023 No Yes Q76.0 Spina bifida occulta 03/31/2023 No Yes Z93.3 Colostomy status 03/31/2023 No Yes I10 Essential (primary) hypertension 03/31/2023 No Yes I25.10 Atherosclerotic heart disease of native coronary artery without angina pectoris 03/31/2023 No Yes Inactive Problems Resolved Problems DIONYSUS, VANDERHOEK ([id number]) [id number] Page 5 of 5 Electronic Signature(s) Signed: 08/29/2023 1:52:52 PM By: Allen Derry PA-C Entered By: Allen Derry on 08/29/2023 13:52:52

## 2023-08-29 NOTE — Progress Notes (Signed)
[name], [name] ([id number]) [id number] Page 1 of 8 Visit Report for 08/29/2023 Arrival Information Details Patient Name: Date of Service: [name] [facility], DO [place] LD 08/29/2023 1:45 PM Medical Record Number: [id number] Patient Account Number: [id number] Date of Birth/Sex: Treating RN: [birth year] (57 y.o. [name]) [name] Primary Care Donzell Coller: Aram Beecham Other Clinician: Betha Loa Referring Zackory Pudlo: Treating Nyleah Mcginnis/Extender: Gabriel Earing in Treatment: 21 Visit Information History Since Last Visit All ordered tests and consults were completed: No Patient Arrived: Wheel Chair Added or deleted any medications: No Arrival Time: 13:48 Any new allergies or adverse reactions: No Transfer Assistance: Transfer Board Had a fall or experienced change in No Patient Identification Verified: Yes activities of daily living that may affect Secondary Verification Process Completed: Yes risk of falls: Patient Requires Transmission-Based Precautions: No Signs or symptoms of abuse/neglect since last visito No Patient Has Alerts: No Hospitalized since last visit: No Implantable device outside of the clinic excluding No cellular tissue based products placed in the center since last visit: Has Dressing in Place as Prescribed: Yes Pain Present Now: No Electronic Signature(s) Unsigned Entered By: Betha Loa on 08/29/2023 13:53:58 -------------------------------------------------------------------------------- Clinic Level of Care Assessment Details Patient Name: Date of ServiceEulas [name] [facility], [name] 08/29/2023 1:45 PM Medical Record Number: [id number] Patient Account Number: [id number] Date of Birth/Sex: Treating RN: [birth year] (57 y.o. [name] Primary Care Jlyn Cerros: Aram Beecham Other Clinician: Betha Loa Referring Hisako Bugh: Treating Pamela Intrieri/Extender: Gabriel Earing in Treatment: 21 Clinic Level  of Care Assessment Items TOOL 1 Quantity Score []  - 0 Use when EandM and Procedure is performed on INITIAL visit ASSESSMENTS - Nursing Assessment / Reassessment []  - 0 General Physical Exam (combine w/ comprehensive assessment (listed just below) when performed on new pt. evalsSallyanne Havers, [name] ([id number]) [id number] Page 2 of 8 []  - 0 Comprehensive Assessment (HX, ROS, Risk Assessments, Wounds Hx, etc.) ASSESSMENTS - Wound and Skin Assessment / Reassessment []  - 0 Dermatologic / Skin Assessment (not related to wound area) ASSESSMENTS - Ostomy and/or Continence Assessment and Care []  - 0 Incontinence Assessment and Management []  - 0 Ostomy Care Assessment and Management (repouching, etc.) PROCESS - Coordination of Care []  - 0 Simple Patient / Family Education for ongoing care []  - 0 Complex (extensive) Patient / Family Education for ongoing care []  - 0 Staff obtains Chiropractor, Records, T Results / Process Orders est []  - 0 Staff telephones HHA, Nursing Homes / Clarify orders / etc []  - 0 Routine Transfer to another Facility (non-emergent condition) []  - 0 Routine Hospital Admission (non-emergent condition) []  - 0 New Admissions / Manufacturing engineer / Ordering NPWT Apligraf, etc. , []  - 0 Emergency Hospital Admission (emergent condition) PROCESS - Special Needs []  - 0 Pediatric / Minor Patient Management []  - 0 Isolation Patient Management []  - 0 Hearing / Language / Visual special needs []  - 0 Assessment of Community assistance (transportation, D/C planning, etc.) []  - 0 Additional assistance / Altered mentation []  - 0 Support Surface(s) Assessment (bed, cushion, seat, etc.) INTERVENTIONS - Miscellaneous []  - 0 External ear exam []  - 0 Patient Transfer (multiple staff / Nurse, adult / Similar devices) []  - 0 Simple Staple / Suture removal (25 or less) []  - 0 Complex Staple / Suture removal (26 or more) []  -  0 Hypo/Hyperglycemic Management (do not check if billed separately) []  - 0 Ankle / Brachial Index (ABI) - do not check if billed separately Has the patient been seen at  the hospital within the last three years: Yes Total Score: 0 Level Of Care: ____ Electronic Signature(s) Unsigned Entered By: Betha Loa on 08/29/2023 14:22:20 -------------------------------------------------------------------------------- Encounter Discharge Information Details Patient Name: Date of ServiceEulas [name] [facility], [name] 08/29/2023 1:45 PM Medical Record Number: [id number] Patient Account Number: [id number] Chase, [name] ([id number]) [id number] Page 3 of 8 Date of Birth/Sex: Treating RN: Nov 30, 1965 (57 y.o. [name]) [name] Primary Care Darika Ildefonso: Aram Beecham Other Clinician: Betha Loa Referring Beth Spackman: Treating Graesyn Schreifels/Extender: Gabriel Earing in Treatment: 21 Encounter Discharge Information Items [name] Procedure Vitals Discharge Condition: Stable Temperature (F): 98.6 Ambulatory Status: Wheelchair Pulse (bpm): 110 Discharge Destination: Home Respiratory Rate (breaths/min): 18 Transportation: Other Blood Pressure (mmHg): 127/77 Accompanied By: self Schedule Follow-up Appointment: Yes Clinical Summary of Care: Electronic Signature(s) Unsigned Entered ByBetha Loa on 08/29/2023 14:35:49 -------------------------------------------------------------------------------- Lower Extremity Assessment Details Patient Name: Date of ServiceEulas [name] [facility], [name] 08/29/2023 1:45 PM Medical Record Number: [id number] Patient Account Number: [id number] Date of Birth/Sex: Treating RN: 31-Jul-1966 (57 y.o. [name] Primary Care Idona Stach: Aram Beecham Other Clinician: Betha Loa Referring Erryn Dickison: Treating Ivori Storr/Extender: Gabriel Earing in Treatment: 21 Electronic Signature(s) Unsigned Entered By:  Betha Loa on 08/29/2023 14:06:29 -------------------------------------------------------------------------------- Multi Wound Chart Details Patient Name: Date of Service: [facility], [name] 08/29/2023 1:45 PM Medical Record Number: [id number] Patient Account Number: [id number] Date of Birth/Sex: Treating RN: 12-05-1965 (57 y.o. [name] Primary Care Srinidhi Landers: Aram Beecham Other Clinician: Betha Loa Referring Donna Silverman: Treating Adalee Kathan/Extender: Hermine Messick Weeks in Treatment: 21 Vital Signs Height(in): Pulse(bpm): 110 [place], [place] ([id number]) [id number] Page 4 of 8 Weight(lbs): 90 Blood Pressure(mmHg): 127/77 Body Mass Index(BMI): Temperature(F): 98.6 Respiratory Rate(breaths/min): 18 [5:Photos:] [N/A:N/A] Right, Midline Gluteus N/A N/A Wound Location: Gradually Appeared N/A N/A Wounding Event: Pressure Ulcer N/A N/A Primary Etiology: Cataracts, Glaucoma, Coronary Artery N/A N/A Comorbid History: Disease, Hypertension, History of pressure wounds, Osteoarthritis, Paraplegia 10/04/2021 N/A N/A Date Acquired: 21 N/A N/A Weeks of Treatment: Open N/A N/A Wound Status: No N/A N/A Wound Recurrence: 7x8x1.6 N/A N/A Measurements L x W x D (cm) 43.982 N/A N/A A (cm) : rea 70.372 N/A N/A Volume (cm) : -348.00% N/A N/A % Reduction in A rea: -377.90% N/A N/A % Reduction in Volume: 7 Position 1 (o'clock): 2.8 Maximum Distance 1 (cm): Yes N/A N/A Tunneling: Category/Stage IV N/A N/A Classification: Large N/A N/A Exudate A mount: Serosanguineous N/A N/A Exudate Type: red, brown N/A N/A Exudate Color: Medium (34-66%) N/A N/A Granulation A mount: Red, Pink N/A N/A Granulation Quality: Medium (34-66%) N/A N/A Necrotic A mount: Fat Layer (Subcutaneous Tissue): Yes N/A N/A Exposed Structures: Muscle: Yes Bone: Yes Fascia: No Tendon: No Joint: No None N/A N/A Epithelialization: Treatment  Notes Electronic Signature(s) Unsigned Entered ByBetha Loa on 08/29/2023 14:06:33 -------------------------------------------------------------------------------- Multi-Disciplinary Care Plan Details Patient Name: Date of ServiceEulas [name] [facility], [name] 08/29/2023 1:45 PM Medical Record Number: [id number] Patient Account Number: [id number] Date of Birth/Sex: Treating RN: 1966/08/10 (57 y.o. [name] Primary Care Ewald Beg: Aram Beecham Other Clinician: Betha Loa [place], [name] ([id number]) [id number] Page 5 of 8 Referring Isaias Dowson: Treating Atiya Yera/Extender: Gabriel Earing in Treatment: 21 Active Inactive Pressure Nursing Diagnoses: Potential for impaired tissue integrity related to pressure, friction, moisture, and shear Goals: Patient will remain free from development of additional pressure ulcers Date Initiated: 03/31/2023 Target Resolution Date: 07/01/2023 Goal Status: Active Interventions: Assess: immobility, friction, shearing, incontinence upon admission  and as needed Assess offloading mechanisms upon admission and as needed Assess potential for pressure ulcer upon admission and as needed Notes: Wound/Skin Impairment Nursing Diagnoses: Knowledge deficit related to ulceration/compromised skin integrity Goals: Patient/caregiver will verbalize understanding of skin care regimen Date Initiated: 03/31/2023 Target Resolution Date: 07/01/2023 Goal Status: Active Ulcer/skin breakdown will have a volume reduction of 30% by week 4 Date Initiated: 03/31/2023 Date Inactivated: 06/02/2023 Target Resolution Date: 05/31/2023 Goal Status: Unmet Unmet Reason: comorbidities Ulcer/skin breakdown will have a volume reduction of 50% by week 8 Date Initiated: 03/31/2023 Target Resolution Date: 07/01/2023 Goal Status: Active Ulcer/skin breakdown will have a volume reduction of 80% by week 12 Date Initiated:  03/31/2023 Target Resolution Date: 07/31/2023 Goal Status: Active Ulcer/skin breakdown will heal within 14 weeks Date Initiated: 03/31/2023 Target Resolution Date: 08/31/2023 Goal Status: Active Interventions: Assess patient/caregiver ability to obtain necessary supplies Assess patient/caregiver ability to perform ulcer/skin care regimen upon admission and as needed Assess ulceration(s) every visit Notes: Electronic Signature(s) Unsigned Entered By: Betha Loa on 08/29/2023 14:22:31 -------------------------------------------------------------------------------- Pain Assessment Details Patient Name: Date of ServiceEulas [name] [facility], [name] 08/29/2023 1:45 PM [name] ([id number]) [id number] Page 6 of 8 Medical Record Number: [id number] Patient Account Number: [id number] Date of Birth/Sex: Treating RN: [birth year] (57 y.o. [name]) [name] Primary Care Watson Robarge: Aram Beecham Other Clinician: Betha Loa Referring Zaide Kardell: Treating Georgianne Gritz/Extender: Gabriel Earing in Treatment: 21 Active Problems Location of Pain Severity and Description of Pain Patient Has Paino No Site Locations Pain Management and Medication Current Pain Management: Electronic Signature(s) Unsigned Entered ByBetha Loa on 08/29/2023 13:57:32 -------------------------------------------------------------------------------- Patient/Caregiver Education Details Patient Name: Date of Service: [name] [facility], [name] 11/25/2024andnbsp1:45 PM Medical Record Number: [id number] Patient Account Number: [id number] Date of Birth/Gender: Treating RN: [birth year] (57 y.o. [name] Primary Care Physician: Aram Beecham Other Clinician: Betha Loa Referring Physician: Treating Physician/Extender: Gabriel Earing in Treatment: 21 Education Assessment Education Provided To: Patient Education Topics Provided Wound/Skin  Impairment: Handouts: Other: continue wound care as directed Methods: Explain/Verbal Responses: State content correctly [place], [name] ([id number]) [id number] Page 7 of 8 Electronic Signature(s) Unsigned Entered By: Betha Loa on 08/29/2023 14:24:05 -------------------------------------------------------------------------------- Wound Assessment Details Patient Name: Date of Service: [name] [facility], [name] 08/29/2023 1:45 PM Medical Record Number: [id number] Patient Account Number: [id number] Date of Birth/Sex: Treating RN: 11-29-1965 (57 y.o. [name]) [name] Primary Care Vicki Pasqual: Aram Beecham Other Clinician: Betha Loa Referring Dalisha Shively: Treating Declan Adamson/Extender: Hermine Messick Weeks in Treatment: 21 Wound Status Wound Number: 5 Primary Pressure Ulcer Etiology: Wound Location: Right, Midline Gluteus Wound Open Wounding Event: Gradually Appeared Status: Date Acquired: 10/04/2021 Comorbid Cataracts, Glaucoma, Coronary Artery Disease, Hypertension, Weeks Of Treatment: 21 History: History of pressure wounds, Osteoarthritis, Paraplegia Clustered Wound: No Photos Wound Measurements Length: (cm) 7 Width: (cm) 8 Depth: (cm) 1.6 Area: (cm) 43.982 Volume: (cm) 70.372 % Reduction in Area: -348% % Reduction in Volume: -377.9% Epithelialization: None Tunneling: Yes Position (o'clock): 7 Maximum Distance: (cm) 2.8 Wound Description Classification: Category/Stage IV Exudate Amount: Large Exudate Type: Serosanguineous Exudate Color: red, brown Foul Odor After Cleansing: No Slough/Fibrino Yes Wound Bed Granulation Amount: Medium (34-66%) Exposed Structure Granulation Quality: Red, Pink Fascia Exposed: No Necrotic Amount: Medium (34-66%) Fat Layer (Subcutaneous Tissue) Exposed: Yes Tendon Exposed: No Muscle Exposed: Yes Necrosis of Muscle: No Joint Exposed: No [name] ([id number])  [id number] Page 8 of 8 Bone Exposed: Yes Treatment Notes Wound #5 (Gluteus) Wound Laterality: Right, Midline  Cleanser Byram Ancillary Kit - 15 Day Supply Discharge Instruction: Use supplies as instructed; Kit contains: (15) Saline Bullets; (15) 3x3 Gauze; 15 pr Gloves Peri-Wound Care Topical Primary Dressing Gauze Discharge Instruction: moistened with Dakins Solution Secondary Dressing Zetuvit Plus 4x4 (in/in) Secured With Medipore T - 46M Medipore H Soft Cloth Surgical T ape ape, 2x2 (in/yd) Compression Wrap Compression Stockings Add-Ons Electronic Signature(s) Unsigned Entered ByBetha Loa on 08/29/2023 14:05:27 -------------------------------------------------------------------------------- Vitals Details Patient Name: Date of Service: [facility], [name] 08/29/2023 1:45 PM Medical Record Number: [id number] Patient Account Number: [id number] Date of Birth/Sex: Treating RN: 03/04/1966 (57 y.o. [name]) [name] Primary Care [place] Fleek: Aram Beecham Other Clinician: Betha Loa Referring Teng Decou: Treating Jeramy Dimmick/Extender: Gabriel Earing in Treatment: 21 Vital Signs Time Taken: 13:54 Temperature (F): 98.6 Weight (lbs): 90 Pulse (bpm): 110 Respiratory Rate (breaths/min): 18 Blood Pressure (mmHg): 127/77 Reference Range: 80 - 120 mg / dl Electronic Signature(s) Unsigned Entered ByBetha Loa on 08/29/2023 13:57:27 Signature(s): Date(s):

## 2023-09-12 ENCOUNTER — Encounter: Payer: 59 | Attending: Physician Assistant | Admitting: Physician Assistant

## 2023-09-12 DIAGNOSIS — M1990 Unspecified osteoarthritis, unspecified site: Secondary | ICD-10-CM | POA: Insufficient documentation

## 2023-09-12 DIAGNOSIS — Z91199 Patient's noncompliance with other medical treatment and regimen due to unspecified reason: Secondary | ICD-10-CM | POA: Diagnosis not present

## 2023-09-12 DIAGNOSIS — E876 Hypokalemia: Secondary | ICD-10-CM | POA: Diagnosis not present

## 2023-09-12 DIAGNOSIS — N183 Chronic kidney disease, stage 3 unspecified: Secondary | ICD-10-CM | POA: Insufficient documentation

## 2023-09-12 DIAGNOSIS — Z87442 Personal history of urinary calculi: Secondary | ICD-10-CM | POA: Diagnosis not present

## 2023-09-12 DIAGNOSIS — I2510 Atherosclerotic heart disease of native coronary artery without angina pectoris: Secondary | ICD-10-CM | POA: Diagnosis not present

## 2023-09-12 DIAGNOSIS — L89319 Pressure ulcer of right buttock, unspecified stage: Secondary | ICD-10-CM | POA: Diagnosis present

## 2023-09-12 DIAGNOSIS — I129 Hypertensive chronic kidney disease with stage 1 through stage 4 chronic kidney disease, or unspecified chronic kidney disease: Secondary | ICD-10-CM | POA: Insufficient documentation

## 2023-09-12 DIAGNOSIS — G8220 Paraplegia, unspecified: Secondary | ICD-10-CM | POA: Insufficient documentation

## 2023-09-12 DIAGNOSIS — Z933 Colostomy status: Secondary | ICD-10-CM | POA: Diagnosis not present

## 2023-09-12 DIAGNOSIS — N319 Neuromuscular dysfunction of bladder, unspecified: Secondary | ICD-10-CM | POA: Diagnosis not present

## 2023-09-12 DIAGNOSIS — Q059 Spina bifida, unspecified: Secondary | ICD-10-CM | POA: Diagnosis not present

## 2023-09-12 DIAGNOSIS — L89323 Pressure ulcer of left buttock, stage 3: Secondary | ICD-10-CM | POA: Diagnosis not present

## 2023-09-12 DIAGNOSIS — L89314 Pressure ulcer of right buttock, stage 4: Secondary | ICD-10-CM | POA: Diagnosis not present

## 2023-09-12 NOTE — Progress Notes (Signed)
[name], [name] ([id number]) [id number] Page 1 of 4 Visit Report for 09/12/2023 Chief Complaint Document Details Patient Name: Date of Service: [name] [name], DO NA [name] 09/12/2023 1:45 PM Medical Record Number: [id number] Patient Account Number: [id number] Date of Birth/Sex: Treating RN: 14-Oct-1965 (57 y.o. [name]) [name] Primary Care Provider: Aram [name] Other Clinician: Betha [name] Referring Provider: Treating Provider/Extender: [name] in Treatment: 23 Information Obtained from: Patient Chief Complaint Right ischial tuberosity pressure ulcer with chronic osteomyelitis and left gluteal ulcer Electronic Signature(s) Signed: 09/12/2023 2:07:10 PM By: Chase Derry PA-C Entered By: [name] on 09/12/2023 11:07:10 -------------------------------------------------------------------------------- Physician Orders Details Patient Name: Date of Service: [name] [name] 09/12/2023 1:45 PM Medical Record Number: [id number] Patient Account Number: [id number] Date of Birth/Sex: Treating RN: November 21, 1965 (57 y.o. [name] Primary Care Provider: Aram [name] Other Clinician: Betha [name] Referring Provider: Treating Provider/Extender: [name] in Treatment: 23 The following information was scribed by: [name] The information was scribed for: [name] Verbal / Phone Orders: No Diagnosis Coding ICD-10 Coding Code Description M86.68 Other chronic osteomyelitis, other site L89.314 Pressure ulcer of right buttock, stage 4 L89.323 Pressure ulcer of left buttock, stage 3 Q76.0 Spina bifida occulta Z93.3 Colostomy status I10 Essential (primary) hypertension I25.10 Atherosclerotic heart disease of native coronary artery without angina pectoris [name] ([id number]) [id number] Page 2 of 4 Follow-up Appointments Return Appointment in 1  week. Bathing/ [employer] wounds with antibacterial soap and water. Anesthetic (Use 'Patient Medications' Section for Anesthetic Order Entry) Lidocaine applied to wound bed Off-Loading Gel wheelchair cushion [name] bed/mattress - ETHOS Low air-loss mattress (Group 2) - [name] BED WITH GROUPE 2 AIR MATTRESS AND TRAPEZE BAR ETHOS Turn and reposition every 2 hours Additional Orders / Instructions Other: - apply calmoseptine or AandD ointment to left glut area daily for protection change dressing every morning and every evening Wound Treatment Wound #5 - Gluteus Wound Laterality: Right, Midline Cleanser: Byram Ancillary Kit - 15 Day Supply (Generic) 2 x Per Day/30 Days Discharge Instructions: Use supplies as instructed; Kit contains: (15) Saline Bullets; (15) 3x3 Gauze; 15 pr Gloves Prim Dressing: Gauze 2 x Per Day/30 Days ary Discharge Instructions: moistened with Dakins Solution Secondary Dressing: Zetuvit Plus 4x4 (in/in) 2 x Per Day/30 Days Secured With: Medipore T - 42M Medipore H Soft Cloth Surgical T ape ape, 2x2 (in/yd) 2 x Per Day/30 Days Electronic Signature(s) Unsigned Entered By: [name] on 09/12/2023 11:29:24 Prescription 09/12/2023 -------------------------------------------------------------------------------- Chase Pugh PA-C Patient Name: Provider: [birth year] [id number] Date of Birth: NPI#: [id number] Sex: DEA #: [phone] Phone #: License #: UPN: Patient Address: [address] [facility] [address] [name] [place], [place] [address] [address], [address] [place], [place] [address] [phone] Allergies Iodinated Contrast Media; Sulfa (Sulfonamide Antibiotics); latex; morphine Provider's Orders [name] bed/mattress - ETHOS Hand Signature: Date(s): [name] ([id number]) [id number] Page 3 of  4 Prescription 09/12/2023 Chase Pugh PA-C Patient Name: Provider: 1966-07-29 [id number] Date of Birth: NPI#: [name] [id number] Sex: DEA #: [phone] Phone #: License #: UPN: Patient Address: [address]  [facility] [address] [facility] [place], [place] [address] [address] [place], [place] [address] [phone] Allergies Iodinated Contrast Media; Sulfa (Sulfonamide Antibiotics); latex; morphine Provider's Orders Low air-loss mattress (Group 2) - [name] BED WITH GROUPE  2 AIR MATTRESS AND TRAPEZE BAR ETHOS Hand Signature: Date(s): Electronic Signature(s) Unsigned Entered By: [name] on 09/12/2023 11:29:24 -------------------------------------------------------------------------------- Problem List Details Patient Name: Date of ServiceEulas [name] [facility], DO NA [name] 09/12/2023 1:45 PM Medical Record Number: [id number] Patient Account Number: [id number] Date of Birth/Sex: Treating RN: Jan 30, 1966 (57 y.o. [name] Primary Care Provider: Aram [name] Other Clinician: Betha [name] Referring Provider: Treating Provider/Extender: [name] in Treatment: 23 Active Problems ICD-10 Encounter Code Description Active Date MDM Diagnosis M86.68 Other chronic osteomyelitis, other site 05/19/2023 No Yes L89.314 Pressure ulcer of right buttock, stage 4 03/31/2023 No Yes L89.323 Pressure ulcer of left buttock, stage 3 07/28/2023 No Yes Q76.0 Spina bifida occulta 03/31/2023 No Yes [name] ([id number]) [id number] Page 4 of 4 Z93.3 Colostomy status 03/31/2023 No Yes I10 Essential (primary) hypertension 03/31/2023 No Yes I25.10 Atherosclerotic heart disease of native coronary artery without angina pectoris 03/31/2023 No Yes Inactive Problems Resolved Problems Electronic Signature(s) Signed: 09/12/2023 2:07:06 PM By:  Chase Derry PA-C Entered By: [name] on 09/12/2023 11:07:06 -------------------------------------------------------------------------------- SuperBill Details Patient Name: Date of Service: [name] [name] 09/12/2023 Medical Record Number: [id number] Patient Account Number: [id number] Date of Birth/Sex: Treating RN: 06-12-1966 (57 y.o. [name]) [name] Primary Care Provider: Aram [name] Other Clinician: Betha [name] Referring Provider: Treating Provider/Extender: [name] in Treatment: 23 Diagnosis Coding ICD-10 Codes Code Description M86.68 Other chronic osteomyelitis, other site L89.314 Pressure ulcer of right buttock, stage 4 L89.323 Pressure ulcer of left buttock, stage 3 Q76.0 Spina bifida occulta Z93.3 Colostomy status I10 Essential (primary) hypertension I25.10 Atherosclerotic heart disease of native coronary artery without angina pectoris Facility Procedures : CPT4 Code: [id number] Description: 99213 - WOUND CARE VISIT-LEV 3 EST PT Modifier: Quantity: 1 Electronic Signature(s) Unsigned Entered ByBetha [name] on 09/12/2023 11:43:24 Signature(s): Date(s):

## 2023-09-13 NOTE — Progress Notes (Signed)
[name], [name] ([id number]) [id number] Page 1 of 9 Visit Report for 09/12/2023 Arrival Information Details Patient Name: Date of Service: [name] [facility], DO [place] LD 09/12/2023 1:45 PM Medical Record Number: [id number] Patient Account Number: [id number] Date of Birth/Sex: Treating RN: 13-Mar-1966 (57 y.o. [name]) [name] Primary Care Chenae Brager: Aram Beecham Other Clinician: Betha Loa Referring Jakory Matsuo: Treating Snyder Colavito/Extender: Gabriel Earing in Treatment: 23 Visit Information History Since Last Visit All ordered tests and consults were completed: No Patient Arrived: Wheel Chair Added or deleted any medications: No Arrival Time: 13:50 Any new allergies or adverse reactions: No Transfer Assistance: Transfer Board Had a fall or experienced change in No Patient Identification Verified: Yes activities of daily living that may affect Secondary Verification Process Completed: Yes risk of falls: Patient Requires Transmission-Based Precautions: No Signs or symptoms of abuse/neglect since last visito No Patient Has Alerts: No Hospitalized since last visit: No Implantable device outside of the clinic excluding No cellular tissue based products placed in the center since last visit: Has Dressing in Place as Prescribed: Yes Pain Present Now: No Electronic Signature(s) Signed: 09/12/2023 4:48:25 PM By: Betha Loa Entered By: Betha Loa on 09/12/2023 10:53:52 -------------------------------------------------------------------------------- Clinic Level of Care Assessment Details Patient Name: Date of ServiceEulas [name] [facility], [name] 09/12/2023 1:45 PM Medical Record Number: [id number] Patient Account Number: [id number] Date of Birth/Sex: Treating RN: 1966-03-17 (57 y.o. [name] Primary Care [place]: Aram Beecham Other Clinician: Betha Loa Referring Oney Tatlock: Treating Jaquavius Hudler/Extender: Gabriel Earing in Treatment: 23 Clinic Level of Care Assessment Items TOOL 4 Quantity Score []  - 0 Use when only an EandM is performed on FOLLOW-UP visit ASSESSMENTS - Nursing Assessment / Reassessment X- 1 10 Reassessment of Co-morbidities (includes updates in patient status) X- 1 5 Reassessment of Adherence to Treatment Plan ADARIAN, GOTWALT ([id number]) [id number] Page 2 of 9 ASSESSMENTS - Wound and Skin A ssessment / Reassessment X - Simple Wound Assessment / Reassessment - one wound 1 5 []  - 0 Complex Wound Assessment / Reassessment - multiple wounds []  - 0 Dermatologic / Skin Assessment (not related to wound area) ASSESSMENTS - Focused Assessment []  - 0 Circumferential Edema Measurements - multi extremities []  - 0 Nutritional Assessment / Counseling / Intervention []  - 0 Lower Extremity Assessment (monofilament, tuning fork, pulses) []  - 0 Peripheral Arterial Disease Assessment (using hand held doppler) ASSESSMENTS - Ostomy and/or Continence Assessment and Care []  - 0 Incontinence Assessment and Management []  - 0 Ostomy Care Assessment and Management (repouching, etc.) PROCESS - Coordination of Care X - Simple Patient / Family Education for ongoing care 1 15 []  - 0 Complex (extensive) Patient / Family Education for ongoing care []  - 0 Staff obtains Chiropractor, Records, T Results / Process Orders est []  - 0 Staff telephones HHA, Nursing Homes / Clarify orders / etc []  - 0 Routine Transfer to another Facility (non-emergent condition) []  - 0 Routine Hospital Admission (non-emergent condition) []  - 0 New Admissions / Manufacturing engineer / Ordering NPWT Apligraf, etc. , []  - 0 Emergency Hospital Admission (emergent condition) X- 1 10 Simple Discharge Coordination []  - 0 Complex (extensive) Discharge Coordination PROCESS - Special Needs []  - 0 Pediatric / Minor Patient Management []  - 0 Isolation Patient Management []  -  0 Hearing / Language / Visual special needs []  - 0 Assessment of Community assistance (transportation, D/C planning, etc.) []  - 0 Additional assistance / Altered mentation []  - 0 Support Surface(s) Assessment (bed, cushion, seat, etc.)  INTERVENTIONS - Wound Cleansing / Measurement X - Simple Wound Cleansing - one wound 1 5 []  - 0 Complex Wound Cleansing - multiple wounds X- 1 5 Wound Imaging (photographs - any number of wounds) []  - 0 Wound Tracing (instead of photographs) X- 1 5 Simple Wound Measurement - one wound []  - 0 Complex Wound Measurement - multiple wounds INTERVENTIONS - Wound Dressings []  - 0 Small Wound Dressing one or multiple wounds X- 1 15 Medium Wound Dressing one or multiple wounds []  - 0 Large Wound Dressing one or multiple wounds []  - 0 Application of Medications - topical []  - 0 Application of Medications - injection INTERVENTIONS - Miscellaneous []  - 0 External ear exam GERAMIE, MORTAZAVI ([id number]) [id number] Page 3 of 9 []  - 0 Specimen Collection (cultures, biopsies, blood, body fluids, etc.) []  - 0 Specimen(s) / Culture(s) sent or taken to Lab for analysis []  - 0 Patient Transfer (multiple staff / Michiel Sites Lift / Similar devices) []  - 0 Simple Staple / Suture removal (25 or less) []  - 0 Complex Staple / Suture removal (26 or more) []  - 0 Hypo / Hyperglycemic Management (close monitor of Blood Glucose) []  - 0 Ankle / Brachial Index (ABI) - do not check if billed separately X- 1 5 Vital Signs Has the patient been seen at the hospital within the last three years: Yes Total Score: 80 Level Of Care: New/Established - Level 3 Electronic Signature(s) Signed: 09/12/2023 4:48:25 PM By: Betha Loa Entered By: Betha Loa on 09/12/2023 11:43:17 -------------------------------------------------------------------------------- Encounter Discharge Information Details Patient Name: Date of Service: [name] [facility], DO NA  LD 09/12/2023 1:45 PM Medical Record Number: [id number] Patient Account Number: [id number] Date of Birth/Sex: Treating RN: 03/30/1966 (57 y.o. [name] Primary Care Rhone Ozaki: Aram Beecham Other Clinician: Betha Loa Referring Johnell Landowski: Treating Kaiyah Eber/Extender: Gabriel Earing in Treatment: 23 Encounter Discharge Information Items Discharge Condition: Stable Ambulatory Status: Wheelchair Discharge Destination: Home Transportation: Other Accompanied By: self Schedule Follow-up Appointment: Yes Clinical Summary of Care: Electronic Signature(s) Signed: 09/12/2023 4:48:25 PM By: Betha Loa Entered By: Betha Loa on 09/12/2023 12:17:41 Lower Extremity Assessment Details -------------------------------------------------------------------------------- [name] ([id number]) [id number] Page 4 of 9 Patient Name: Date of ServiceEulas [name] [facility], [name] 09/12/2023 1:45 PM Medical Record Number: [id number] Patient Account Number: [id number] Date of Birth/Sex: Treating RN: 01-May-1966 (57 y.o. [name]) [name] Primary Care Aniella Wandrey: Aram Beecham Other Clinician: Betha Loa Referring Shanterica Biehler: Treating Taurus Alamo/Extender: Hermine Messick Weeks in Treatment: 23 Electronic Signature(s) Signed: 09/12/2023 4:48:25 PM By: Betha Loa Signed: 09/13/2023 3:25:36 PM By: [name] RN Entered By: Betha Loa on 09/12/2023 11:05:22 -------------------------------------------------------------------------------- Multi Wound Chart Details Patient Name: Date of Service: [name] TH, [name] 09/12/2023 1:45 PM Medical Record Number: [id number] Patient Account Number: [id number] Date of Birth/Sex: Treating RN: January 06, 1966 (57 y.o. [name] Primary Care Shirley Decamp: Aram Beecham Other Clinician: Betha Loa Referring Syann Cupples: Treating Terryn Redner/Extender: Gabriel Earing in  Treatment: 23 Vital Signs Height(in): Pulse(bpm): 110 Weight(lbs): 90 Blood Pressure(mmHg): 125/78 Body Mass Index(BMI): Temperature(F): 98.2 Respiratory Rate(breaths/min): 18 [5:Photos:] [N/A:N/A] Right, Midline Gluteus N/A N/A Wound Location: Gradually Appeared N/A N/A Wounding Event: Pressure Ulcer N/A N/A Primary Etiology: Cataracts, Glaucoma, Coronary Artery N/A N/A Comorbid History: Disease, Hypertension, History of pressure wounds, Osteoarthritis, Paraplegia 10/04/2021 N/A N/A Date Acquired: 23 N/A N/A Weeks of Treatment: Open N/A N/A Wound Status: No N/A N/A Wound Recurrence: 6x9.2x1.7 N/A N/A Measurements L x W x D (cm) 43.354  N/A N/A A (cm) : rea 73.702 N/A N/A Volume (cm) : -341.60% N/A N/A % Reduction in A rea: -400.50% N/A N/A % Reduction in Volume: 7 Position 1 (o'clock): 2.4 Maximum Distance 1 (cm): Yes N/A N/A Tunneling: Category/Stage IV N/A N/A Classification: Large N/A N/A Exudate A mount: Serosanguineous N/A N/A Exudate Type: red, brown N/A N/A Exudate Color: Medium (34-66%) N/A N/A Granulation A mountFatima Sanger ([id number]) [id number] Page 5 of 9 Red, Pink N/A N/A Granulation Quality: Medium (34-66%) N/A N/A Necrotic Amount: Fat Layer (Subcutaneous Tissue): Yes N/A N/A Exposed Structures: Muscle: Yes Bone: Yes Fascia: No Tendon: No Joint: No None N/A N/A Epithelialization: Treatment Notes Electronic Signature(s) Signed: 09/12/2023 4:48:25 PM By: Betha Loa Entered By: Betha Loa on 09/12/2023 11:05:26 -------------------------------------------------------------------------------- Multi-Disciplinary Care Plan Details Patient Name: Date of Service: [name] TH, [name] 09/12/2023 1:45 PM Medical Record Number: [id number] Patient Account Number: [id number] Date of Birth/Sex: Treating RN: Dec 12, 1965 (57 y.o. [name] Primary Care Ilynn Stauffer: Aram Beecham Other Clinician:  Betha Loa Referring Jakeb Lamping: Treating Dimples Probus/Extender: Gabriel Earing in Treatment: 23 Active Inactive Pressure Nursing Diagnoses: Potential for impaired tissue integrity related to pressure, friction, moisture, and shear Goals: Patient will remain free from development of additional pressure ulcers Date Initiated: 03/31/2023 Target Resolution Date: 07/01/2023 Goal Status: Active Interventions: Assess: immobility, friction, shearing, incontinence upon admission and as needed Assess offloading mechanisms upon admission and as needed Assess potential for pressure ulcer upon admission and as needed Notes: Wound/Skin Impairment Nursing Diagnoses: Knowledge deficit related to ulceration/compromised skin integrity Goals: Patient/caregiver will verbalize understanding of skin care regimen Date Initiated: 03/31/2023 Target Resolution Date: 07/01/2023 Goal Status: Active Ulcer/skin breakdown will have a volume reduction of 30% by week 4 Date Initiated: 03/31/2023 Date Inactivated: 06/02/2023 Target Resolution Date: 05/31/2023 Goal Status: Unmet Unmet Reason: comorbidities Ulcer/skin breakdown will have a volume reduction of 50% by week 8 Date Initiated: 03/31/2023 Target Resolution Date: 07/01/2023 Goal Status: Active Ulcer/skin breakdown will have a volume reduction of 80% by week 12 [place], [name] ([id number]) [id number] Page 6 of 9 Date Initiated: 03/31/2023 Target Resolution Date: 07/31/2023 Goal Status: Active Ulcer/skin breakdown will heal within 14 weeks Date Initiated: 03/31/2023 Target Resolution Date: 08/31/2023 Goal Status: Active Interventions: Assess patient/caregiver ability to obtain necessary supplies Assess patient/caregiver ability to perform ulcer/skin care regimen upon admission and as needed Assess ulceration(s) every visit Notes: Electronic Signature(s) Signed: 09/12/2023 4:48:25 PM By: Betha Loa Signed: 09/13/2023 3:25:36 PM By: [name] RN Entered By: Betha Loa on 09/12/2023 11:43:27 -------------------------------------------------------------------------------- Pain Assessment Details Patient Name: Date of Service: [name] TH, [name] 09/12/2023 1:45 PM Medical Record Number: [id number] Patient Account Number: [id number] Date of Birth/Sex: Treating RN: June 05, 1966 (57 y.o. [name] Primary Care Peirce Deveney: Aram Beecham Other Clinician: Betha Loa Referring Arne Schlender: Treating Dwight Adamczak/Extender: Gabriel Earing in Treatment: 23 Active Problems Location of Pain Severity and Description of Pain Patient Has Paino No Site Locations Pain Management and Medication Current Pain Management: Electronic Signature(s) Signed: 09/12/2023 4:48:25 PM By: Betha Loa Signed: 09/13/2023 3:25:36 PM By: [name] RN Entered By: Betha Loa on 09/12/2023 10:56:33 [name] ([id number]) [id number] Page 7 of 9 -------------------------------------------------------------------------------- Patient/Caregiver Education Details Patient Name: Date of Service: [name] [facility], DO [place] LD 12/9/2024andnbsp1:45 PM Medical Record Number: [id number] Patient Account Number: [id number] Date of Birth/Gender: Treating RN: 04-Mar-1966 (57 y.o. [name]) [name] Primary Care Physician: Aram Beecham Other Clinician: Betha Loa Referring Physician: Treating Physician/Extender: Allen Derry  Aram Beecham Weeks in Treatment: 23 Education Assessment Education Provided To: Patient Education Topics Provided Wound/Skin Impairment: Handouts: Other: continue wound care as directed Methods: Explain/Verbal Responses: State content correctly Electronic Signature(s) Signed: 09/12/2023 4:48:25 PM By: Betha Loa Entered By: Betha Loa on 09/12/2023  12:17:01 -------------------------------------------------------------------------------- Wound Assessment Details Patient Name: Date of Service: [name] [facility], [name] 09/12/2023 1:45 PM Medical Record Number: [id number] Patient Account Number: [id number] Date of Birth/Sex: Treating RN: 1966-03-28 (57 y.o. [name] Primary Care Kensleigh Gates: Aram Beecham Other Clinician: Betha Loa Referring Garrick Midgley: Treating Khari Mally/Extender: Hermine Messick Weeks in Treatment: 23 Wound Status Wound Number: 5 Primary Pressure Ulcer Etiology: Wound Location: Right, Midline Gluteus Wound Open Wounding Event: Gradually Appeared Status: Date Acquired: 10/04/2021 Comorbid Cataracts, Glaucoma, Coronary Artery Disease, Hypertension, Weeks Of Treatment: 23 History: History of pressure wounds, Osteoarthritis, Paraplegia Clustered Wound: No Photos [place], [name] ([id number]) [id number] Page 8 of 9 Wound Measurements Length: (cm) 6 Width: (cm) 9.2 Depth: (cm) 1.7 Area: (cm) 43.354 Volume: (cm) 73.702 % Reduction in Area: -341.6% % Reduction in Volume: -400.5% Epithelialization: None Tunneling: Yes Position (o'clock): 7 Maximum Distance: (cm) 2.4 Wound Description Classification: Category/Stage IV Exudate Amount: Large Exudate Type: Serosanguineous Exudate Color: red, brown Foul Odor After Cleansing: No Slough/Fibrino Yes Wound Bed Granulation Amount: Medium (34-66%) Exposed Structure Granulation Quality: Red, Pink Fascia Exposed: No Necrotic Amount: Medium (34-66%) Fat Layer (Subcutaneous Tissue) Exposed: Yes Tendon Exposed: No Muscle Exposed: Yes Necrosis of Muscle: No Joint Exposed: No Bone Exposed: Yes Treatment Notes Wound #5 (Gluteus) Wound Laterality: Right, Midline Cleanser [place] Ancillary Kit - 15 Day Supply Discharge Instruction: Use supplies as instructed; Kit contains: (15) Saline Bullets; (15) 3x3 Gauze; 15 pr  Gloves Peri-Wound Care Topical Primary Dressing Gauze Discharge Instruction: moistened with Dakins Solution Secondary Dressing Zetuvit Plus 4x4 (in/in) Secured With Medipore T - 40M Medipore H Soft Cloth Surgical T ape ape, 2x2 (in/yd) Compression Wrap Compression Stockings Add-Ons Electronic Signature(s) Signed: 09/12/2023 4:48:25 PM By: Betha Loa Signed: 09/13/2023 3:25:36 PM By: [name] RN Entered By: Betha Loa on 09/12/2023 11:05:14 [name] ([id number]) [id number] Page 9 of 9 -------------------------------------------------------------------------------- Vitals Details Patient Name: Date of ServiceEulas [name] [facility], [name] 09/12/2023 1:45 PM Medical Record Number: [id number] Patient Account Number: [id number] Date of Birth/Sex: Treating RN: July 16, 1966 (57 y.o. [name]) [name] Primary Care Lonnette Shrode: Aram Beecham Other Clinician: Betha Loa Referring Runa Whittingham: Treating Darrian Grzelak/Extender: Gabriel Earing in Treatment: 23 Vital Signs Time Taken: 13:54 Temperature (F): 98.2 Weight (lbs): 90 Pulse (bpm): 110 Respiratory Rate (breaths/min): 18 Blood Pressure (mmHg): 125/78 Reference Range: 80 - 120 mg / dl Electronic Signature(s) Signed: 09/12/2023 4:48:25 PM By: Betha Loa Entered By: Betha Loa on 09/12/2023 10:56:29

## 2023-09-20 ENCOUNTER — Ambulatory Visit: Payer: 59 | Admitting: Physician Assistant

## 2023-09-22 ENCOUNTER — Encounter: Payer: 59 | Admitting: Physician Assistant

## 2023-09-22 DIAGNOSIS — L89323 Pressure ulcer of left buttock, stage 3: Secondary | ICD-10-CM | POA: Diagnosis not present

## 2023-09-23 NOTE — Progress Notes (Signed)
[place], [name] ([id number]) [id number] Page 1 of 13 Visit Report for 09/22/2023 Chief Complaint Document Details Patient Name: Date of Service: [name] [facility], DO [place] LD 09/22/2023 1:45 PM Medical Record Number: [id number] Patient Account Number: [id number] Date of Birth/Sex: Treating RN: 1966-04-24 (57 y.o. [name] Primary Care Provider: Aram [name] Other Clinician: Betha [name] Referring Provider: Treating Provider/Extender: [name] in Treatment: 25 Information Obtained from: Patient Chief Complaint Right ischial tuberosity pressure ulcer with chronic osteomyelitis and left gluteal ulcer Electronic Signature(s) Signed: 09/22/2023 1:55:57 PM By: Chase Derry PA-C Entered By: [name] on 09/22/2023 10:55:57 -------------------------------------------------------------------------------- Debridement Details Patient Name: Date of Service: [name] TH, [name] 09/22/2023 1:45 PM Medical Record Number: [id number] Patient Account Number: [id number] Date of Birth/Sex: Treating RN: 1966-05-02 (57 y.o. [name] Primary Care Provider: Aram [name] Other Clinician: Betha [name] Referring Provider: Treating Provider/Extender: [name] in Treatment: 25 Debridement Performed for Assessment: Wound #5 Right,Midline Gluteus Performed By: Physician Chase Derry, PA-C Chase following information was scribed by: [name] Chase information was scribed for: [name] Debridement Type: Debridement Level of Consciousness (Pre-procedure): Awake and Alert Pre-procedure Verification/Time Out Yes - 14:13 Taken: Start Time: 14:13 Percent of Wound Bed Debrided: 100% T Area Debrided (cm): otal 26.14 Tissue and other material debrided: Viable, Non-Viable, Slough, Subcutaneous, Slough Level: Skin/Subcutaneous Tissue Debridement Description: Excisional Instrument: Curette Bleeding:  Minimum Hemostasis Achieved: Pressure Response to Treatment: Procedure was tolerated well Level of Consciousness [name], [name] ([id number]) [id number] Page 2 of 13 Level of Consciousness ([name] Awake and Alert procedure): [name] Debridement Measurements of Total Wound Length: (cm) 3.7 Stage: Category/Stage IV Width: (cm) 9 Depth: (cm) 3.2 Volume: (cm) 83.692 Character of Wound/Ulcer [name] Debridement: Stable [name] Procedure Diagnosis Same as Pre-procedure Electronic Signature(s) Signed: 09/22/2023 4:27:20 PM By: [name] Signed: 09/23/2023 12:10:58 PM By: [name] Signed: 09/23/2023 12:16:54 PM By: Chase Derry PA-C Entered By: [name] on 09/22/2023 11:16:58 -------------------------------------------------------------------------------- HPI Details Patient Name: Date of Service: [name] TH, [name] 09/22/2023 1:45 PM Medical Record Number: [id number] Patient Account Number: [id number] Date of Birth/Sex: Treating RN: 08-16-1966 (57 y.o. [name] Primary Care Provider: Aram [name] Other Clinician: Betha [name] Referring Provider: Treating Provider/Extender: [name] in Treatment: 25 History of Present Illness HPI Description: 57 year old male with a history of spina bifida presenting to [place] with a history of a open wound on Chase left gluteal region near his upper thigh which she's had for several [name]. He was seen in Chase ED recently at [facility] and was put on doxycycline and was advised some local care. his past medical history is significant for spinal bifid, neurogenic bladder, kidney stones, sacral pressure sores, constipation. Past surgical history significant for partial cystectomy, ileal conduit, VP shunt removal, percutaneous nephro lithotripsy. In Chase remote past Chase patient says he's had some treatment for a ulcer on this area and was treated with a skin  graft. Readmission: 10/16/2021 upon evaluation today patient appears to be doing somewhat poorly in regard to a fairly large wound noted over Chase right ischial tuberosity location. This is a stage IV pressure ulcer. Of note this is also positive for osteomyelitis upon a CT scan which was performed during Chase time that he was admitted in Chase hospital on 09/19/2021. With that being said it is noted in Chase right inferior buttock that he has a decubitus ulcer which extends to Chase posterior toe inferior right ischium  where there is evidence of osteomyelitis. When he was here in Chase office actually missed that this was positive I missed read it and thought it said that there was no osteomyelitis. That is not Chase case there is in fact osteomyelitis noted here. I actually did review his notes as well in epic. Looking to Chase discharge summary it appears that Chase patient was admitted from 09/19/2021 through 09/23/2021. He was discharged home with home health care according to Chase note. With that being said from what I understand his sister actually takes care of him at this point. He was also to follow-up with a primary care provider and here at [facility] wound care center within a week. I am just now seeing him on Chase 13th almost a month after discharge. He does have a history of again osteomyelitis of this right hip/ischial tuberosity location. 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 Chase reason for his admission was actually worsening of Chase decubitus ulcer upon admission. He does have chronic osteomyelitis of this area. He was treated with empiric antibiotics he had acute kidney injury which improved with IV fluids he also had hypokalemia. Surgical debridement was performed by general surgery at that time and ID was consulted to assist with management according to  Chase notes. IV antibiotics were recommended but patient declined and wanted oral antibiotics at discharge. Therefore no IV antibiotics were planned for discharge time. This case was under Chase care of Dr. Joylene Draft while he was in Chase hospital when she did discharge him with a 2-week course of doxycycline and Augmentin according to Chase notes. It looks like Dr. Judithann Sheen office did call and leave a message for Chase patient to get scheduled for a follow-up visit after hospital discharge I do not see where he showed up in fact it appears that Chase patient has a no-show letter from Dr. Judithann Sheen office noted in epic due to a appointment that was missed on 10/06/2021. Looking at Chase pictures from Dr. Cristine Polio notes on 09/23/2021 it does appear that Chase wound is a little bit better as far as Chase cleanliness of Chase surface of Chase wound today but nonetheless still is a very significant wound. 10/26/2021 upon evaluation today patient appears to be doing okay in regard to his wound. Again this is a wound that he did indeed have osteomyelitis and previous. With that being said I do not see any signs of a worsening infection at this time which is good news. Overall I think that we are headed in Chase right direction although still I think he needs more aggressive offloading. 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 I do believe this would be ideal for him to be honest. Readmission: [name], [name] ([id number]) [id number] Page 3 of 13 01-15-2022 upon evaluation today patient presents for reevaluation he was last seen on October 26, 2021. At that time unfortunately he was having issues with a significant ischial pressure ulcer on Chase right ischial tuberosity. Subsequently he ended up having decent response to Chase Dakin's moistened gauze dressing that was previously being utilized and subsequently Chase patient's sister who is his primary caregiver as well states that she  decided just to try to manage this at home. Nonetheless unfortunately this is getting a little larger not better. He does spend a lot  of Chase day in his chair. He does have an air mattress according what they tell me today. That was previously ordered. With that being said I do believe that at this time things do appear to be doing much better from Chase standpoint of infection I do not see any signs of overt infection. There is also no evidence of systemic infection. No fevers, chills, nausea, vomiting, or diarrhea. With that being said I do believe based on what I am seeing Chase wound is a bit cleaner and he possibly could be an excellent candidate for a wound VAC. 02-05-2022 upon evaluation today patient appears to be doing about Chase same in regard to his wound. Unfortunately he is not any better but he also really has not had Chase wound VAC on sufficiently. There is been some confusion with Chase orders part of that I think is our follow-up with Chase way Chase order was written part of it may be with how Chase wound VAC is being placed either way we need to see what we can do to try to improve things in this regard. We will clarify Chase orders today going forward for home health. Unfortunately Chase patient does have a new wound today which is on Chase opposite side. This is not good 1 was bad enough have another area to open is definitely not a good thing. Its not as deep but nonetheless is also not very shallow either. We may potentially be able to get this to Chase point that we could bridge Chase 2 together in order to wound VAC both but right now want a focus on to try to get Chase wound VAC going for Chase main wound initially we will likely use Hydrofera Blue on Chase new wound.Marland Kitchen 02-19-2022 upon evaluation today patient's wound appears to be doing a little bit worse in Chase way of maceration fortunately there is no signs of active infection locally or systemically which is great news. No fevers, chills, nausea, vomiting,  or diarrhea. 03-05-2022 upon evaluation today patient unfortunately does not appear to be doing nearly as good in regard to his wounds as I would like to see. Fortunately I do not see any evidence of active infection that is obvious although I am can obtain a wound culture to ensure that there is not anything getting worse here as far as Chase wound without Chase wound VAC on his concern. With that being said I will go ahead and get this sent in and evaluated will initiate treatment as needed going forward if necessary. With regard to Chase wound with Chase wound VAC this continues to be placed directly over Chase wound and there is obvious signs of deep tissue injury. Chase suction being here means that he is sitting on it I think this may be part of Chase reason why it is coming off although not 100% certain. 03-12-2022 upon evaluation today patient still has not gotten Chase antibiotics that were called in for him 4 days ago. He tells me that his sister just told him this morning that they were ready but that she has not had a chance to go pick them up. Nonetheless I feel like that this is really something he needs any should have been taken that not Chase other antibiotics which were not doing Chase job for him to be honest. Chase patient states and I completely agree that there is really no way he would have known relying on his sister to let him know obviously. Nonetheless I  do believe that he needs to not be taking any other antibiotics and be on Chase Levaquin as soon as possible. He tells me that she said she was going to pick them up today. In regard to home health I am hopeful that they actually have been bridging this now. I do think that Chase wound looks better and this is good news. With that being said I am not certain that I can really tell for sure how this was attached it was removed before he came in and Chase black foam left in place. I really want him to leave Chase wound VAC in place until he comes in so that we  can monitor and make sure that this is being done correctly he voiced understanding. 03-19-2022 upon evaluation today and much happier with how things appear as far as Chase patient's wounds are concerned. I think he is on a much better track and they are doing a better job at bridging although its not quite where I like to see it is still more posterior which I think Chase patient needs to be a little bit more lateral on his side. Either way this is something that I did marked with a small dot today for him to tell Chase home health nurses well so she can bridge it to that location also think Chase tubing should go up instead of going down to just make it less cumbersome overall for him. 04-19-2022 upon evaluation today patient appears to be doing a little better in regard to his wounds. Fortunately I do not see any signs of infection I do believe he is on Chase right track. Chase wound VAC does seem to be helping to clean Chase wound up a bit and bring it in from Chase base at work. This is good news. 05-10-2022 upon evaluation today patient appears to be doing well currently in regard to his wounds in general in fact of Chase smaller of Chase 2 wounds without Chase wound VAC actually appears to be doing so much better this is almost completely closed and very pleased in that regard. He has been staying off of this he tells me and shows. With that being said Chase wound with Chase wound VAC is going require some sharp debridement there is some tissue which is not quite as healthy and we are going to go ahead and continue that for probably 2 more [name] although I think we may be closer to discontinuing this as well since it has healed and quite significantly. 05-24-2022 upon evaluation today patient appears to be doing well with regard to his wound in fact this looks better than I have been expected. This actually I think is a good time to go ahead and switch away from Chase wound VAC based on what I am seeing. I think we go to  [facility] with a bordered foam dressing. 06-21-2022 upon evaluation today patient appears to be doing well currently in regard to his wound this is measuring smaller and looks well. Fortunately there does not appear to be any signs of active infection locally or systemically at this time which is great news. No fevers, chills, nausea, vomiting, or diarrhea. 07-29-2022 upon evaluation today patient appears to be doing well currently in regard to his wound. This is actually measuring smaller although there is a lot of hypergranulation noted. Fortunately there does not appear to be any signs of active infection locally or systemically at this time. No fevers, chills, nausea, vomiting, or  diarrhea. 11/9; pressure ulcer on Chase right buttock in Chase setting of spina bifida and lower extremity paralysis. He has been using [facility] 09-02-2022 upon evaluation today patient appears to be doing well currently in regard to his wound. We have been using Hydrofera Blue this is showing signs of improvement as far as getting smaller is concerned. There is some drainage around Chase edges of Chase wound but I think a lot of this is due to Chase fact he did not even have a dressing on that he came in today. 12/29; right buttock pressure area which was initially stage IV. This is in Chase setting of a patient with spina bifida spends most of his time in a wheelchair. I am not certain how much he is offloading this. He has been using for [facility] as a primary dressing. When he was here Chase last visit he was felt to have hyper granulated tissue and was treated with silver nitrate chemical cauterization 10-14-2022 upon evaluation today patient appears to be doing poorly in regard to Chase wound in Chase right ischial location. Unfortunately he tells me that Chase dressing seems to be falling off frequently. Unfortunately I think that this has contributed to him developing an infection which appears to be exemplified  by Chase fact that he has bone exposed which is actually necrotic and working its way out of Chase wound up and have to perform a fairly significant debridement today it does appear. I think that he needs to have these dressings ensure that they are in place at all times. Also discussed with Chase patient that we will get a need to do some testing in order to see if he indeed has an infection. If he does have a bone infection which I think is pretty likely to be osteomyelitis of this ischial location. I discussed that with Chase patient in detail today and depending on what we see him and make any adjustments in care as far as going forward is concerned. Obviously depressed antibiotic that we gave him that he tells me was somewhat irritating as far as Chase stomach side effects are concerned. He states he really would not want to be on that again. Either way I feel he is probably going to be placed on something once we get Chase results of Chase culture back. I am honestly concerned about Chase fact that he comes into Chase clinic without any dressings in place I am not sure how often they are really staying where they need to be as far as this wound is concerned. I actually cannot remember seeing him anytime in Chase past 2 months or so at least that he came in with a dressing intact. 10-21-2022 upon evaluation today patient presents for initial inspection here in our clinic concerning issues that he has been having with a wound in Chase sacral area. Again last time he was here I did debride away necrotic bone which was confirmed on pathology although it was not necessarily confirmed that this was secondary to osteomyelitis on Chase pathology report. Also Chase x-ray did not neither confirm nor deny Chase presence of osteomyelitis and his culture unfortunately showed multiple organisms with nothing predominance of this does not help to rule and tailor down exactly what is going on here. Fortunately I do not see any signs of  active infection systemically which is great news. With that being said locally I am definitely seeing some issues here. In fact this is warm to touch around Chase  sacral area and I think that cellulitis is definitely present I just cannot pinpoint Chase exact organism. With that being said I did discuss with Chase patient as well today that I do believe that he probably needs an MRI in order to further quantify Chase extent of her likely osteomyelitis and this again is something that we are going to order this will need to be without contrast due to Chase fact that he is allergic to contrast dye. [place], [name] ([id number]) [id number] Page 4 of 13 11-18-2022 upon evaluation today patient's wound is actually showing some signs of improvement currently. We have been doing Chase Dakin's moistened gauze dressing which I think is helping to clean up Chase wound for seeing some granulation growing and still Chase tissue is not quite as healthy as I would love to see but better than previous. He does have osteomyelitis but Chase good news is he seems to be treated well with Chase antibiotics he does have a refill that was just picked up and I think that he needs to continue to take that for certain. 3/7; patient is on a 6-week clinic hiatus. He lives at home with his sister however she is not at home all Chase time. He transfers out of bed and a sliding board. He claims he is only in Chase in Chase wheelchair about 4 5 hours. He does have underlying osteomyelitis I am not sure about Chase status of Chase antibiotics here. Part of Chase wound still easily probes to bone 12-23-2022 upon evaluation today patient appears to be doing well currently in regard to his wound all things considered there is still a lot of hypergranulation but I do feel like that with Chase Vashe moistened gauze is actually doing better than he was previously with Chase Hydrofera Blue. This just did not seem to be staying in place and with Chase  Vashe through able to change this more frequently. Readmission: 03-31-2023 this is a gentleman whom I have seen multiple times intermittently throughout Chase years compliance has been a real issue however. I do feel like there is some significant issues here still with Chase fact that dressing changes and keeping dressings in place are still pretty much as a significant problem here for [place]. I do not see any signs of active infection at this time which is great news but at Chase same time Chase wounds do not worse in fact he has another wound different than what I even noted previous. Subsequently he I do believe that Chase patient should continue to do monitor for any evidence of infection or worsening. Overall I think he needs to have regular follow-up visits with [place] and I think that he needs to have dressings in place at all times. He tells me that he is at home primarily by himself transferring himself from Chase first thing in Chase morning through at least 3 in Chase afternoon as his sister works he really needs somebody gets a caregiver with him throughout Chase day. 04-12-2023 upon evaluation today patient appears to be doing a little worse currently in regard to his wounds. He has been tolerating Chase dressing changes without complication. Fortunately I do not see any evidence of active infection locally nor systemically at this point. 04-21-2023 upon evaluation today patient's wound unfortunately appears to have purulent drainage and also there is a malodorous drainage at this point. Unfortunately I think that there is something going on here that does not seem to be doing nearly as good as what  we would like to see. I think that he is likely going to require some antibiotics. He has taken Cipro before without complication. 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 On 7/18 he had purulent drainage he had a PCR culture and is now on Cipro he is tolerating this well. Using  Hydrofera Blue's a 2 bit and border foam his sister is changing Chase dressing After his sister goes to work Chase patient is apparently in a wheelchair all day. There is no way to heal Chase wound like this with that degree of prolonged pressure. 05-12-2023 upon evaluation today patient unfortunately appears to be doing much worse than what we previously seen. Last time he was here he was seen by Dr. Leanord Hawking. This was shortly after I put him on Cipro he was tolerating that well. Nonetheless he did not show up last week apparently there was a transportation issue. This week upon arrival this wound appears to be doing significantly worse there is a significant malodorous drainage and this is deeper he has erythema in Chase gluteal region around it appears to be infected and to be honest despite Chase Cipro and oral antibiotics he seems to be doing worse not better. 05-19-2023 upon evaluation patient appears to be doing better than last time I saw him with regard to his wound in Chase right gluteal location. Fortunately there does not appear to be any signs of systemic infection though he does have evidence of osteomyelitis noted which were acute on chronic and that was Chase findings that were noted in Chase hospital when he was admitted most recently after I sent him from May 12, 2023 through May 17, 2023. Chase patient was treated with IV Zosyn and bank while in Chase hospital and discharged on Augmentin although that has not been picked up yet it has been 2 days since he was discharged he states that "[facility] pharmacy never called his sister to let her know it was ready". Nonetheless this needs to be gotten ASAP to make sure that he is fully treated as far as Chase infection is concerned I discussed that with him today as well. 05-26-2023 upon evaluation today patient appears to be doing better today with regard to his wounds compared to what we have noticed previous. Fortunately I do not see any evidence of active infection  locally or systemically which is great news and in general I do believe that we are making headway towards complete closure. With that being said we still had some trouble getting Chase order done for Chase trapeze and aids to help him with regard to his daily activities and preventing Chase dressings from coming off. We have reached out to his CAP Worker and have not gotten in touch with her as of yet. I did recommend that Chase patient try to see. He or his sister to get in touch with Angelique and see if they can help [place] in that regard. 8/29; patient with 2 wounds in close juxtaposition on Chase right buttock. Both of these probes very close to Chase bone especially Chase area over Chase ischial tuberosity. Patient was recently in [facility] hospital for additional antibiotics I did not look at this today. We are using Dakin's wet to dry. His sister changes in dressings. Chase wounds are measuring larger. 06-16-2023 upon evaluation today patient appears to be doing worse currently at this time. Fortunately I do not see any signs of infection which is good  news but at Chase same time he does have deep tissue injury which has me concerned about potential further breakdown here. He does tell me that he has been up on it a little bit more. 07-12-2023 upon evaluation today patient appears to be doing okay in regard to his wound there is actually an area that is merged between Chase 2 and therefore this is really just 1 wound we converted it as such. There is no separation between Chase 2. I knew that this was going to happen it was just a matter of time. With that being said I do not see any signs of infection obvious at this point but I do believe that this is still an issue with him keeping Chase dressing in place he tells me it is not staying in place which does have me concerned as well. Fortunately I do not see any evidence of worsening overall systemically though locally I am a little concerned about how Chase wounds have digressed  since I last saw him. 07-28-2023 upon evaluation today patient unfortunately appears to be doing worse with a new wound at this point on Chase left side that was not there last time I saw him. He has had missed visits in Chase past 4 visits he has missed 2 of those unfortunately. This again was a condition about seeing him back that he had to make sure that he showed up for all visits. That is not happening at Chase moment and I have discussion with him today about that. I discussed that if he is not going to show up for his visits that we are going to have to discharge him that was Chase original agreement and that is still where things stand at this point to be honest. Chase patient voiced understanding. I told him that we are going to give him 1 more shot today before we would proceed to anything like that but again if he is not coming to Chase appointments as directed that there is really no point and has continued to have them on Chase books as far as trying to get this healed is concerned. He voiced understanding. He still tells me that his dressings were also not staying in place in fact he tells me that every time he gets out of Chase bed Chase dressing is already off. He came in today with no dressing on. Noncompliance has been a significant issue through Chase course of Chase treatment with this gentleman and still continues to be so. 08-04-2023 upon evaluation today patient's wound actually appears to be doing better than it has for quite some time. I am actually seeing some improvement which is good news and in general I do believe that we will make an some headway here. I do not see any signs of obvious infection there is also no need for obvious debridement today. Overall I think that we are on Chase right track. He did actually have a dressing in place upon arrival today. 08-15-2023 upon evaluation today patient appears to be doing well currently in regard to his wound. He actually looks much better he has been  in Chase hospital since I last saw him. He tells me that his sister got concerned about Chase appearance of his wound he was there where they did aggressive offloading and put him on IV antibiotics as well he seems to be doing much better. 08-29-2023 upon evaluation today Chase patient unfortunately presents without a dressing in place. When I inquired as to  when Chase dressing came off his response was "well I am really not sure what is today". When I questioned further about this he told me that "I guess it probably came off sometime yesterday". Subsequently I am very frustrated with Chase fact that Chase patient really does not seem to be keeping these dressings in place have had multiple conversations with him many different times about how this needed to stay in place how not have been in place runs Chase risk of infection as well as getting other material into Chase wound as well as not allowing Chase wound to even heal appropriately. All of which are bad things. Again this has been an ongoing issue with [name], [name] ([id number]) [id number] Page 5 of 13 noncompliance from his last admission to even this 1 I have given him a lot of grace and leeway on a lot of things including missed appointments for various reasons which were not supposed to happen when he came back. Nonetheless I told him today unequivocally that if he shows up again with a dressing not in place that I will initiate discharge as of that date he will have 30 days to find a new provider. I explained this is not punitive but simply Chase fact that I cannot work with him to heal Chase wound contractual he if he is not going to keep Chase dressings in place and ensure that it is replaced whenever needed. 09-12-2023 upon evaluation today patient appears to be doing worse in regard to his wound. Unfortunately he has been still having issues with keeping his dressings in place. I think he is can have to have this done 2 times a  day going forward. It is going to have to be diligently treated in order to make sure this has any chance of healing he has obvious signs of pressure injury today which is unfortunate this is worse than last time I saw him. With that being said he also is not keeping dressings in place which is a significant issue here. 09-22-2023 upon evaluation today patient appears to be doing a little better in regard to his wound. I am actually going to perform some debridement clearway necrotic debris he is in agreement with Chase plan we will get a try to see about getting [facility] he is doing well with this in Chase past and in fact we were able to get one of his wounds completely healed with use of Chase Hydrofera Blue. This has been in Chase past. Electronic Signature(s) Signed: 09/22/2023 2:21:24 PM By: Chase Derry PA-C Entered By: [name] on 09/22/2023 11:21:23 -------------------------------------------------------------------------------- Physical Exam Details Patient Name: Date of Service: [name] [facility], [name] 09/22/2023 1:45 PM Medical Record Number: [id number] Patient Account Number: [id number] Date of Birth/Sex: Treating RN: [birth year] (57 y.o. [name] Primary Care Provider: Aram [name] Other Clinician: Betha [name] Referring Provider: Treating Provider/Extender: [name] in Treatment: 25 Constitutional Well-nourished and well-hydrated in no acute distress. Respiratory normal breathing without difficulty. Psychiatric this patient is able to make decisions and demonstrates good insight into disease process. Alert and Oriented x 3. pleasant and cooperative. Notes Upon inspection patient's wound bed actually showed signs of good granulation epithelization at this point. Fortunately I do not see any signs of worsening overall and I believe that Chase patient is making good headway here towards closure which is great news. Electronic  Signature(s) Signed: 09/22/2023 2:21:41 PM By: Chase Derry PA-C Entered By: [name] on 09/22/2023  11:21:40 -------------------------------------------------------------------------------- Physician Orders Details Patient Name: Date of ServiceEulas [name] [facility], [name] 09/22/2023 1:45 PM Medical Record Number: [id number] Patient Account Number: [id number] Date of Birth/Sex: Treating RN: 07-09-1966 (57 y.o. [name] [place], [place] ([id number]) [id number] Page 6 of 13 Primary Care Provider: Aram [name] Other Clinician: Betha [name] Referring Provider: Treating Provider/Extender: [name] in Treatment: 25 Chase following information was scribed by: [name] Chase information was scribed for: [name] Verbal / Phone Orders: No Diagnosis Coding ICD-10 Coding Code Description M86.68 Other chronic osteomyelitis, other site L89.314 Pressure ulcer of right buttock, stage 4 L89.323 Pressure ulcer of left buttock, stage 3 Q76.0 Spina bifida occulta Z93.3 Colostomy status I10 Essential (primary) hypertension I25.10 Atherosclerotic heart disease of native coronary artery without angina pectoris Follow-up Appointments Return Appointment in 1 week. Bathing/ [employer] wounds with antibacterial soap and water. Anesthetic (Use 'Patient Medications' Section for Anesthetic Order Entry) Lidocaine applied to wound bed Off-Loading Gel wheelchair cushion Hospital bed/mattress - ETHOS Low air-loss mattress (Group 2) - HOSPITAL BED WITH GROUPE 2 AIR MATTRESS AND TRAPEZE BAR ETHOS Turn and reposition every 2 hours Additional Orders / Instructions Other: - apply calmoseptine or AandD ointment to left glut area daily for protection change dressing every morning and every evening Wound Treatment Wound #5 - Gluteus Wound Laterality: Right, Midline Cleanser: Byram Ancillary Kit - 15 Day Supply (Generic) 1 x Per  Day/30 Days Discharge Instructions: Use supplies as instructed; Kit contains: (15) Saline Bullets; (15) 3x3 Gauze; 15 pr Gloves Prim Dressing: Hydrofera Blue Ready Transfer Foam, 4x5 (in/in) (DME) (Dispense As Written) 1 x Per Day/30 Days ary Discharge Instructions: Apply Hydrofera Blue Ready to wound bed as directed Secondary Dressing: ABD Pad 5x9 (in/in) (DME) (Generic) 1 x Per Day/30 Days Discharge Instructions: Cover with ABD pad Secured With: Medipore T - 56M Medipore H Soft Cloth Surgical T ape ape, 2x2 (in/yd) (DME) (Dispense As Written) 1 x Per Day/30 Days Electronic Signature(s) Signed: 09/23/2023 12:10:58 PM By: [name] Signed: 09/23/2023 12:16:54 PM By: Chase Derry PA-C Entered By: [name] on 09/22/2023 11:19:11 [name] ([id number]) [id number] Page 7 of 13 Prescription 09/22/2023 -------------------------------------------------------------------------------- [name] PA-C Patient Name: Provider: 06-Feb-1966 [id number] Date of Birth: NPI#: [name] [id number] Sex: DEA #: [phone] Phone #: License #: UPN: Patient Address[address] [facility] [address] [facility] [place], [place] [address] [address] [place], [place] [address] [phone] Allergies Iodinated Contrast Media; Sulfa (Sulfonamide Antibiotics); latex; morphine Provider's Orders Hospital bed/mattress - ETHOS Hand Signature: Date(s): Prescription 09/22/2023 [name] PA-C Patient Name: Provider: May 24, 1966 [id number] Date of Birth: NPI#: [name] [id number] Sex: DEA #: [phone] Phone #: License #: UPN: Patient Address: [address] [facility] [address] [facility] [place], [place] [address] [address] [place], [place]  [address] [phone] Allergies Iodinated Contrast Media; Sulfa (Sulfonamide Antibiotics); latex; morphine Provider's Orders Low air-loss mattress (Group 2) - HOSPITAL BED WITH GROUPE 2 AIR MATTRESS AND TRAPEZE BAR ETHOS Hand Signature: Date(s): Electronic Signature(s) Signed: 09/23/2023 12:10:58 PM By: [name] Signed: 09/23/2023 12:16:54 PM By: Chase Derry PA-C Entered By: [name] on 09/22/2023 11:19:12 -------------------------------------------------------------------------------- Problem List Details Patient Name: Date of Service: [name] [facility], [name] 09/22/2023 1:45 PM Medical Record Number: [id number] Patient Account Number: [id number] Date of Birth/Sex:  Treating RN: 1965-10-05 (57 y.o. [name] Primary Care Provider: Aram [name] Other Clinician: Betha [name] Referring Provider: Treating Provider/Extender: [name] in Treatment: [address], [place] ([id number]) [id number] Page 8 of 13 Active Problems ICD-10 Encounter Code Description Active Date MDM Diagnosis M86.68 Other chronic osteomyelitis, other site 05/19/2023 No Yes L89.314 Pressure ulcer of right buttock, stage 4 03/31/2023 No Yes L89.323 Pressure ulcer of left buttock, stage 3 07/28/2023 No Yes Q76.0 Spina bifida occulta 03/31/2023 No Yes Z93.3 Colostomy status 03/31/2023 No Yes I10 Essential (primary) hypertension 03/31/2023 No Yes I25.10 Atherosclerotic heart disease of native coronary artery without angina pectoris 03/31/2023 No Yes Inactive Problems Resolved Problems Electronic Signature(s) Signed: 09/22/2023 1:55:49 PM By: Chase Derry PA-C Entered By: [name] on 09/22/2023 10:55:48 -------------------------------------------------------------------------------- Progress Note Details Patient Name: Date of Service: [name] TH, [name] 09/22/2023 1:45 PM Medical Record Number: [id number] Patient Account Number:  [id number] Date of Birth/Sex: Treating RN: 08/18/1966 (57 y.o. [name] Primary Care Provider: Aram [name] Other Clinician: Betha [name] Referring Provider: Treating Provider/Extender: [name] in Treatment: 25 Subjective Chief Complaint Information obtained from Patient Right ischial tuberosity pressure ulcer with chronic osteomyelitis and left gluteal ulcer History of Present Illness (HPI) 57 year old male with a history of spina bifida presenting to [place] with a history of a open wound on Chase left gluteal region near his upper thigh which she's had for several [name]. He was seen in Chase ED recently at [facility] and was put on doxycycline and was advised some local care. his past medical history is significant for spinal bifid, neurogenic bladder, kidney stones, sacral pressure sores, constipation. Past surgical history significant for partial [name], [name] ([id number]) [id number] Page 9 of 13 cystectomy, ileal conduit, VP shunt removal, percutaneous nephro lithotripsy. In Chase remote past Chase patient says he's had some treatment for a ulcer on this area and was treated with a skin graft. Readmission: 10/16/2021 upon evaluation today patient appears to be doing somewhat poorly in regard to a fairly large wound noted over Chase right ischial tuberosity location. This is a stage IV pressure ulcer. Of note this is also positive for osteomyelitis upon a CT scan which was performed during Chase time that he was admitted in Chase hospital on 09/19/2021. With that being said it is noted in Chase right inferior buttock that he has a decubitus ulcer which extends to Chase posterior toe inferior right ischium where there is evidence of osteomyelitis. When he was here in Chase office actually missed that this was positive I missed read it and thought it said that there was no osteomyelitis. That is not Chase case there is in fact  osteomyelitis noted here. I actually did review his notes as well in epic. Looking to Chase discharge summary it appears that Chase patient was admitted from 09/19/2021 through 09/23/2021. He was discharged home with home health care according to Chase note. With that being said from what I understand his sister actually takes care of him at this point. He was also to follow-up with a primary care provider and here at [facility] wound care center within a week. I am just now seeing him on Chase 13th almost a month after discharge. He does have a history of again osteomyelitis of this right hip/ischial tuberosity location. 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 Chase reason for his admission was actually worsening of Chase decubitus ulcer upon admission. He does have chronic osteomyelitis of this area. He was treated with empiric antibiotics he had acute kidney injury which improved with IV fluids he also had hypokalemia. Surgical debridement was performed by general surgery at that time and ID was consulted to assist with management according to Chase notes. IV antibiotics were recommended but patient declined and wanted oral antibiotics at discharge. Therefore no IV antibiotics were planned for discharge time. This case was under Chase care of Dr. Joylene Draft while he was in Chase hospital when she did discharge him with a 2-week course of doxycycline and Augmentin according to Chase notes. It looks like Dr. Judithann Sheen office did call and leave a message for Chase patient to get scheduled for a follow-up visit after hospital discharge I do not see where he showed up in fact it appears that Chase patient has a no-show letter from Dr. Judithann Sheen office noted in epic due to a appointment that was missed on 10/06/2021. Looking at Chase pictures from Dr. Cristine Polio notes on 09/23/2021 it does  appear that Chase wound is a little bit better as far as Chase cleanliness of Chase surface of Chase wound today but nonetheless still is a very significant wound. 10/26/2021 upon evaluation today patient appears to be doing okay in regard to his wound. Again this is a wound that he did indeed have osteomyelitis and previous. With that being said I do not see any signs of a worsening infection at this time which is good news. Overall I think that we are headed in Chase right direction although still I think he needs more aggressive offloading. 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 I do believe this would be ideal for him to be honest. Readmission: 01-15-2022 upon evaluation today patient presents for reevaluation he was last seen on October 26, 2021. At that time unfortunately he was having issues with a significant ischial pressure ulcer on Chase right ischial tuberosity. Subsequently he ended up having decent response to Chase Dakin's moistened gauze dressing that was previously being utilized and subsequently Chase patient's sister who is his primary caregiver as well states that she decided just to try to manage this at home. Nonetheless unfortunately this is getting a little larger not better. He does spend a lot of Chase day in his chair. He does have an air mattress according what they tell me today. That was previously ordered. With that being said I do believe that at this time things do appear to be doing much better from Chase standpoint of infection I do not see any signs of overt infection. There is also no evidence of systemic infection. No fevers, chills, nausea, vomiting, or diarrhea. With that being said I do believe based on what I am seeing Chase wound is a bit cleaner and he possibly could be an excellent candidate for a wound VAC. 02-05-2022 upon evaluation today patient appears to be doing about Chase same in regard to his wound. Unfortunately he is not any better but he also  really has not had Chase wound VAC on sufficiently. There is been some confusion with Chase orders part of that I think is our follow-up with Chase way Chase order was written part of it may be with how Chase wound VAC is being placed either way we need to see what we can do to try to  improve things in this regard. We will clarify Chase orders today going forward for home health. Unfortunately Chase patient does have a new wound today which is on Chase opposite side. This is not good 1 was bad enough have another area to open is definitely not a good thing. Its not as deep but nonetheless is also not very shallow either. We may potentially be able to get this to Chase point that we could bridge Chase 2 together in order to wound VAC both but right now want a focus on to try to get Chase wound VAC going for Chase main wound initially we will likely use Hydrofera Blue on Chase new wound.Marland Kitchen 02-19-2022 upon evaluation today patient's wound appears to be doing a little bit worse in Chase way of maceration fortunately there is no signs of active infection locally or systemically which is great news. No fevers, chills, nausea, vomiting, or diarrhea. 03-05-2022 upon evaluation today patient unfortunately does not appear to be doing nearly as good in regard to his wounds as I would like to see. Fortunately I do not see any evidence of active infection that is obvious although I am can obtain a wound culture to ensure that there is not anything getting worse here as far as Chase wound without Chase wound VAC on his concern. With that being said I will go ahead and get this sent in and evaluated will initiate treatment as needed going forward if necessary. With regard to Chase wound with Chase wound VAC this continues to be placed directly over Chase wound and there is obvious signs of deep tissue injury. Chase suction being here means that he is sitting on it I think this may be part of Chase reason why it is coming off although not 100% certain. 03-12-2022  upon evaluation today patient still has not gotten Chase antibiotics that were called in for him 4 days ago. He tells me that his sister just told him this morning that they were ready but that she has not had a chance to go pick them up. Nonetheless I feel like that this is really something he needs any should have been taken that not Chase other antibiotics which were not doing Chase job for him to be honest. Chase patient states and I completely agree that there is really no way he would have known relying on his sister to let him know obviously. Nonetheless I do believe that he needs to not be taking any other antibiotics and be on Chase Levaquin as soon as possible. He tells me that she said she was going to pick them up today. In regard to home health I am hopeful that they actually have been bridging this now. I do think that Chase wound looks better and this is good news. With that being said I am not certain that I can really tell for sure how this was attached it was removed before he came in and Chase black foam left in place. I really want him to leave Chase wound VAC in place until he comes in so that we can monitor and make sure that this is being done correctly he voiced understanding. 03-19-2022 upon evaluation today and much happier with how things appear as far as Chase patient's wounds are concerned. I think he is on a much better track and they are doing a better job at bridging although its not quite where I like to see it is still more posterior which I think Chase patient  needs to be a little bit more lateral on his side. Either way this is something that I did marked with a small dot today for him to tell Chase home health nurses well so she can bridge it to that location also think Chase tubing should go up instead of going down to just make it less cumbersome overall for him. 04-19-2022 upon evaluation today patient appears to be doing a little better in regard to his wounds. Fortunately I do not see any  signs of infection I do believe he is on Chase right track. Chase wound VAC does seem to be helping to clean Chase wound up a bit and bring it in from Chase base at work. This is good news. 05-10-2022 upon evaluation today patient appears to be doing well currently in regard to his wounds in general in fact of Chase smaller of Chase 2 wounds without Chase wound VAC actually appears to be doing so much better this is almost completely closed and very pleased in that regard. He has been staying off of this he tells me and shows. With that being said Chase wound with Chase wound VAC is going require some sharp debridement there is some tissue which is not quite as healthy and we are going to go ahead and continue that for probably 2 more [name] although I think we may be closer to discontinuing this as well since it has healed and quite significantly. 05-24-2022 upon evaluation today patient appears to be doing well with regard to his wound in fact this looks better than I have been expected. This actually I think is a good time to go ahead and switch away from Chase wound VAC based on what I am seeing. I think we go to [facility] with a bordered foam dressing. 06-21-2022 upon evaluation today patient appears to be doing well currently in regard to his wound this is measuring smaller and looks well. Fortunately there does not appear to be any signs of active infection locally or systemically at this time which is great news. No fevers, chills, nausea, vomiting, or diarrhea. 07-29-2022 upon evaluation today patient appears to be doing well currently in regard to his wound. This is actually measuring smaller although there is a lot of [place] ([id number]) [id number] Page 10 of 13 hypergranulation noted. Fortunately there does not appear to be any signs of active infection locally or systemically at this time. No fevers, chills, nausea, vomiting, or diarrhea. 11/9; pressure ulcer on Chase  right buttock in Chase setting of spina bifida and lower extremity paralysis. He has been using [facility] 09-02-2022 upon evaluation today patient appears to be doing well currently in regard to his wound. We have been using Hydrofera Blue this is showing signs of improvement as far as getting smaller is concerned. There is some drainage around Chase edges of Chase wound but I think a lot of this is due to Chase fact he did not even have a dressing on that he came in today. 12/29; right buttock pressure area which was initially stage IV. This is in Chase setting of a patient with spina bifida spends most of his time in a wheelchair. I am not certain how much he is offloading this. He has been using for [facility] as a primary dressing. When he was here Chase last visit he was felt to have hyper granulated tissue and was treated with silver nitrate chemical cauterization 10-14-2022 upon evaluation today patient appears to be doing poorly  in regard to Chase wound in Chase right ischial location. Unfortunately he tells me that Chase dressing seems to be falling off frequently. Unfortunately I think that this has contributed to him developing an infection which appears to be exemplified by Chase fact that he has bone exposed which is actually necrotic and working its way out of Chase wound up and have to perform a fairly significant debridement today it does appear. I think that he needs to have these dressings ensure that they are in place at all times. Also discussed with Chase patient that we will get a need to do some testing in order to see if he indeed has an infection. If he does have a bone infection which I think is pretty likely to be osteomyelitis of this ischial location. I discussed that with Chase patient in detail today and depending on what we see him and make any adjustments in care as far as going forward is concerned. Obviously depressed antibiotic that we gave him that he tells me was somewhat irritating  as far as Chase stomach side effects are concerned. He states he really would not want to be on that again. Either way I feel he is probably going to be placed on something once we get Chase results of Chase culture back. I am honestly concerned about Chase fact that he comes into Chase clinic without any dressings in place I am not sure how often they are really staying where they need to be as far as this wound is concerned. I actually cannot remember seeing him anytime in Chase past 2 months or so at least that he came in with a dressing intact. 10-21-2022 upon evaluation today patient presents for initial inspection here in our clinic concerning issues that he has been having with a wound in Chase sacral area. Again last time he was here I did debride away necrotic bone which was confirmed on pathology although it was not necessarily confirmed that this was secondary to osteomyelitis on Chase pathology report. Also Chase x-ray did not neither confirm nor deny Chase presence of osteomyelitis and his culture unfortunately showed multiple organisms with nothing predominance of this does not help to rule and tailor down exactly what is going on here. Fortunately I do not see any signs of active infection systemically which is great news. With that being said locally I am definitely seeing some issues here. In fact this is warm to touch around Chase sacral area and I think that cellulitis is definitely present I just cannot pinpoint Chase exact organism. With that being said I did discuss with Chase patient as well today that I do believe that he probably needs an MRI in order to further quantify Chase extent of her likely osteomyelitis and this again is something that we are going to order this will need to be without contrast due to Chase fact that he is allergic to contrast dye. 11-18-2022 upon evaluation today patient's wound is actually showing some signs of improvement currently. We have been doing Chase Dakin's moistened  gauze dressing which I think is helping to clean up Chase wound for seeing some granulation growing and still Chase tissue is not quite as healthy as I would love to see but better than previous. He does have osteomyelitis but Chase good news is he seems to be treated well with Chase antibiotics he does have a refill that was just picked up and I think that he needs to continue to take that for  certain. 3/7; patient is on a 6-week clinic hiatus. He lives at home with his sister however she is not at home all Chase time. He transfers out of bed and a sliding board. He claims he is only in Chase in Chase wheelchair about 4 5 hours. He does have underlying osteomyelitis I am not sure about Chase status of Chase antibiotics here. Part of Chase wound still easily probes to bone 12-23-2022 upon evaluation today patient appears to be doing well currently in regard to his wound all things considered there is still a lot of hypergranulation but I do feel like that with Chase Vashe moistened gauze is actually doing better than he was previously with Chase Hydrofera Blue. This just did not seem to be staying in place and with Chase Vashe through able to change this more frequently. Readmission: 03-31-2023 this is a gentleman whom I have seen multiple times intermittently throughout Chase years compliance has been a real issue however. I do feel like there is some significant issues here still with Chase fact that dressing changes and keeping dressings in place are still pretty much as a significant problem here for [place]. I do not see any signs of active infection at this time which is great news but at Chase same time Chase wounds do not worse in fact he has another wound different than what I even noted previous. Subsequently he I do believe that Chase patient should continue to do monitor for any evidence of infection or worsening. Overall I think he needs to have regular follow-up visits with [place] and I think that he needs to have dressings in place  at all times. He tells me that he is at home primarily by himself transferring himself from Chase first thing in Chase morning through at least 3 in Chase afternoon as his sister works he really needs somebody gets a caregiver with him throughout Chase day. 04-12-2023 upon evaluation today patient appears to be doing a little worse currently in regard to his wounds. He has been tolerating Chase dressing changes without complication. Fortunately I do not see any evidence of active infection locally nor systemically at this point. 04-21-2023 upon evaluation today patient's wound unfortunately appears to have purulent drainage and also there is a malodorous drainage at this point. Unfortunately I think that there is something going on here that does not seem to be doing nearly as good as what we would like to see. I think that he is likely going to require some antibiotics. He has taken Cipro before without complication. 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 On 7/18 he had purulent drainage he had a PCR culture and is now on Cipro he is tolerating this well. Using Hydrofera Blue's a 2 bit and border foam his sister is changing Chase dressing After his sister goes to work Chase patient is apparently in a wheelchair all day. There is no way to heal Chase wound like this with that degree of prolonged pressure. 05-12-2023 upon evaluation today patient unfortunately appears to be doing much worse than what we previously seen. Last time he was here he was seen by Dr. Leanord Hawking. This was shortly after I put him on Cipro he was tolerating that well. Nonetheless he did not show up last week apparently there was a transportation issue. This week upon arrival this wound appears to be doing significantly worse there is a significant malodorous drainage and this is deeper  he has erythema in Chase gluteal region around it appears to be infected and to be honest despite Chase Cipro and oral antibiotics he  seems to be doing worse not better. 05-19-2023 upon evaluation patient appears to be doing better than last time I saw him with regard to his wound in Chase right gluteal location. Fortunately there does not appear to be any signs of systemic infection though he does have evidence of osteomyelitis noted which were acute on chronic and that was Chase findings that were noted in Chase hospital when he was admitted most recently after I sent him from May 12, 2023 through May 17, 2023. Chase patient was treated with IV Zosyn and bank while in Chase hospital and discharged on Augmentin although that has not been picked up yet it has been 2 days since he was discharged he states that "Chase pharmacy never called his sister to let her know it was ready". Nonetheless this needs to be gotten ASAP to make sure that he is fully treated as far as Chase infection is concerned I discussed that with him today as well. 05-26-2023 upon evaluation today patient appears to be doing better today with regard to his wounds compared to what we have noticed previous. Fortunately I do not see any evidence of active infection locally or systemically which is great news and in general I do believe that we are making headway towards complete closure. With that being said we still had some trouble getting Chase order done for Chase trapeze and aids to help him with regard to his daily activities and preventing Chase dressings from coming off. We have reached out to his CAP Worker and have not gotten in touch with her as of yet. I did recommend that Chase patient try to see. He or his sister to get in touch with Angelique and see if they can help [place] in that regard. 8/29; patient with 2 wounds in close juxtaposition on Chase right buttock. Both of these probes very close to Chase bone especially Chase area over Chase ischial tuberosity. Patient was recently in [facility] hospital for additional antibiotics I did not look at this today. We are using Dakin's wet to  dry. His sister changes in dressings. Chase wounds are measuring larger. Tennant, [name] ([id number]) [id number] Page 11 of 13 06-16-2023 upon evaluation today patient appears to be doing worse currently at this time. Fortunately I do not see any signs of infection which is good news but at Chase same time he does have deep tissue injury which has me concerned about potential further breakdown here. He does tell me that he has been up on it a little bit more. 07-12-2023 upon evaluation today patient appears to be doing okay in regard to his wound there is actually an area that is merged between Chase 2 and therefore this is really just 1 wound we converted it as such. There is no separation between Chase 2. I knew that this was going to happen it was just a matter of time. With that being said I do not see any signs of infection obvious at this point but I do believe that this is still an issue with him keeping Chase dressing in place he tells me it is not staying in place which does have me concerned as well. Fortunately I do not see any evidence of worsening overall systemically though locally I am a little concerned about how Chase wounds have digressed since I last saw him. 07-28-2023 upon  evaluation today patient unfortunately appears to be doing worse with a new wound at this point on Chase left side that was not there last time I saw him. He has had missed visits in Chase past 4 visits he has missed 2 of those unfortunately. This again was a condition about seeing him back that he had to make sure that he showed up for all visits. That is not happening at Chase moment and I have discussion with him today about that. I discussed that if he is not going to show up for his visits that we are going to have to discharge him that was Chase original agreement and that is still where things stand at this point to be honest. Chase patient voiced understanding. I told him that we are going to give  him 1 more shot today before we would proceed to anything like that but again if he is not coming to Chase appointments as directed that there is really no point and has continued to have them on Chase books as far as trying to get this healed is concerned. He voiced understanding. He still tells me that his dressings were also not staying in place in fact he tells me that every time he gets out of Chase bed Chase dressing is already off. He came in today with no dressing on. Noncompliance has been a significant issue through Chase course of Chase treatment with this gentleman and still continues to be so. 08-04-2023 upon evaluation today patient's wound actually appears to be doing better than it has for quite some time. I am actually seeing some improvement which is good news and in general I do believe that we will make an some headway here. I do not see any signs of obvious infection there is also no need for obvious debridement today. Overall I think that we are on Chase right track. He did actually have a dressing in place upon arrival today. 08-15-2023 upon evaluation today patient appears to be doing well currently in regard to his wound. He actually looks much better he has been in Chase hospital since I last saw him. He tells me that his sister got concerned about Chase appearance of his wound he was there where they did aggressive offloading and put him on IV antibiotics as well he seems to be doing much better. 08-29-2023 upon evaluation today Chase patient unfortunately presents without a dressing in place. When I inquired as to when Chase dressing came off his response was "well I am really not sure what is today". When I questioned further about this he told me that "I guess it probably came off sometime yesterday". Subsequently I am very frustrated with Chase fact that Chase patient really does not seem to be keeping these dressings in place have had multiple conversations with him many different times about how  this needed to stay in place how not have been in place runs Chase risk of infection as well as getting other material into Chase wound as well as not allowing Chase wound to even heal appropriately. All of which are bad things. Again this has been an ongoing issue with noncompliance from his last admission to even this 1 I have given him a lot of grace and leeway on a lot of things including missed appointments for various reasons which were not supposed to happen when he came back. Nonetheless I told him today unequivocally that if he shows up again with a dressing not in place  that I will initiate discharge as of that date he will have 30 days to find a new provider. I explained this is not punitive but simply Chase fact that I cannot work with him to heal Chase wound contractual he if he is not going to keep Chase dressings in place and ensure that it is replaced whenever needed. 09-12-2023 upon evaluation today patient appears to be doing worse in regard to his wound. Unfortunately he has been still having issues with keeping his dressings in place. I think he is can have to have this done 2 times a day going forward. It is going to have to be diligently treated in order to make sure this has any chance of healing he has obvious signs of pressure injury today which is unfortunate this is worse than last time I saw him. With that being said he also is not keeping dressings in place which is a significant issue here. 09-22-2023 upon evaluation today patient appears to be doing a little better in regard to his wound. I am actually going to perform some debridement clearway necrotic debris he is in agreement with Chase plan we will get a try to see about getting [facility] he is doing well with this in Chase past and in fact we were able to get one of his wounds completely healed with use of Chase Hydrofera Blue. This has been in Chase past. Objective Constitutional Well-nourished and well-hydrated in no acute  distress. Vitals Time Taken: 1:53 PM, Weight: 90 lbs, Temperature: 98.1 F, Pulse: 99 bpm, Respiratory Rate: 18 breaths/min, Blood Pressure: 115/76 mmHg. Respiratory normal breathing without difficulty. Psychiatric this patient is able to make decisions and demonstrates good insight into disease process. Alert and Oriented x 3. pleasant and cooperative. General Notes: Upon inspection patient's wound bed actually showed signs of good granulation epithelization at this point. Fortunately I do not see any signs of worsening overall and I believe that Chase patient is making good headway here towards closure which is great news. Integumentary (Hair, Skin) Wound #5 status is Open. Original cause of wound was Gradually Appeared. Chase date acquired was: 10/04/2021. Chase wound has been in treatment 25 [name]. Chase wound is located on Chase Right,Midline Gluteus. Chase wound measures 3.7cm length x 9cm width x 3.2cm depth; 26.154cm^2 area and 83.692cm^3 volume. There is bone, muscle, and Fat Layer (Subcutaneous Tissue) exposed. There is tunneling at 7:00 with a maximum distance of 2.5cm. There is a large amount of serosanguineous drainage noted. There is medium (34-66%) red, pink granulation within Chase wound bed. There is a medium (34-66%) amount of necrotic tissue within Chase wound bed. Assessment Active Problems ICD-10 Other chronic osteomyelitis, other site Pressure ulcer of right buttock, stage 4 [place], [place] ([id number]) [id number] Page 12 of 13 Pressure ulcer of left buttock, stage 3 Spina bifida occulta Colostomy status Essential (primary) hypertension Atherosclerotic heart disease of native coronary artery without angina pectoris Procedures Wound #5 Pre-procedure diagnosis of Wound #5 is a Pressure Ulcer located on Chase Right,Midline Gluteus . There was a Excisional Skin/Subcutaneous Tissue Debridement with a total area of 26.14 sq cm performed by Chase Derry, PA-C. With  Chase following instrument(s): Curette to remove Viable and Non-Viable tissue/material. Material removed includes Subcutaneous Tissue and Slough and. A time out was conducted at 14:13, prior to Chase start of Chase procedure. A Minimum amount of bleeding was controlled with Pressure. Chase procedure was tolerated well. [name] Debridement Measurements: 3.7cm length x 9cm width x 3.2cm depth; 83.692cm^3  volume. [name] debridement Stage noted as Category/Stage IV. Character of Wound/Ulcer [name] Debridement is stable. [name] procedure Diagnosis Wound #5: Same as Pre-Procedure Plan Follow-up Appointments: Return Appointment in 1 week. Bathing/ Shower/ Hygiene: Wash wounds with antibacterial soap and water. Anesthetic (Use 'Patient Medications' Section for Anesthetic Order Entry): Lidocaine applied to wound bed Off-Loading: Gel wheelchair cushion Hospital bed/mattress - ETHOS Low air-loss mattress (Group 2) - HOSPITAL BED WITH GROUPE 2 AIR MATTRESS AND TRAPEZE BAR ETHOS Turn and reposition every 2 hours Additional Orders / Instructions: Other: - apply calmoseptine or AandD ointment to left glut area daily for protection change dressing every morning and every evening WOUND #5: - Gluteus Wound Laterality: Right, Midline Cleanser: [place]ram Ancillary Kit - 15 Day Supply (Generic) 1 x Per Day/30 Days Discharge Instructions: Use supplies as instructed; Kit contains: (15) Saline Bullets; (15) 3x3 Gauze; 15 pr Gloves Prim Dressing: Hydrofera Blue Ready Transfer Foam, 4x5 (in/in) (DME) (Dispense As Written) 1 x Per Day/30 Days ary Discharge Instructions: Apply Hydrofera Blue Ready to wound bed as directed Secondary Dressing: ABD Pad 5x9 (in/in) (DME) (Generic) 1 x Per Day/30 Days Discharge Instructions: Cover with ABD pad Secured With: Medipore T - 80M Medipore H Soft Cloth Surgical T ape ape, 2x2 (in/yd) (DME) (Dispense As Written) 1 x Per Day/30 Days 1. Based on what I am seeing I would recommend that we switch to  [facility] dressing I think this is probably can to be Chase best way to go at this point. 2. I am also can recommend that we have him use Chase ABD pad to cover and secured with tape. 3. If he is unable to get [facility] and until he gets it I would recommend continuation of Chase wet-to-dry dressing with Dakin solution and then Chase dressings to cover. We will see patient back for reevaluation in 1 week here in Chase clinic. If anything worsens or changes patient will contact our office for additional recommendations. Electronic Signature(s) Signed: 09/22/2023 2:22:37 PM By: Chase Derry PA-C Entered By: [name] on 09/22/2023 11:22:37 -------------------------------------------------------------------------------- SuperBill Details Patient Name: Date of Service: [name] TH, [name] 09/22/2023 [name] ([id number]) [id number] Page 13 of 13 Medical Record Number: [id number] Patient Account Number: [id number] Date of Birth/Sex: Treating RN: 08-27-1966 (57 y.o. [name] Primary Care Provider: Aram [name] Other Clinician: Betha [name] Referring Provider: Treating Provider/Extender: [name] in Treatment: 25 Diagnosis Coding ICD-10 Codes Code Description [phone] Other chronic osteomyelitis, other site L89.314 Pressure ulcer of right buttock, stage 4 L89.323 Pressure ulcer of left buttock, stage 3 Q76.0 Spina bifida occulta Z93.3 Colostomy status I10 Essential (primary) hypertension I25.10 Atherosclerotic heart disease of native coronary artery without angina pectoris Facility Procedures : CPT4 Code: [id number] 1 Description: 1042 - DEB SUBQ TISSUE 20 SQ CM/< ICD-10 Diagnosis Description L89.314 Pressure ulcer of right buttock, stage 4 Modifier: Quantity: 1 : CPT4 Code: [id number] 1 Description: 1045 - DEB SUBQ TISS EA ADDL 20CM ICD-10 Diagnosis Description L89.314 Pressure ulcer of right buttock,  stage 4 Modifier: Quantity: 1 Physician Procedures : CPT4 Code Description Modifier 11042 11042 - WC PHYS SUBQ TISS 20 SQ CM ICD-10 Diagnosis Description L89.314 Pressure ulcer of right buttock, stage 4 Quantity: 1 : [id number] 11045 - WC PHYS SUBQ TISS EA ADDL 20 CM ICD-10 Diagnosis Description L89.314 Pressure ulcer of right buttock, stage 4 Quantity: 1 Electronic Signature(s) Signed: 09/22/2023 2:22:58 PM By: Chase Derry PA-C Entered By: [name] on 09/22/2023 11:22:57

## 2023-09-24 NOTE — Progress Notes (Signed)
[place], [name] ([id number]) [id number] Page 1 of 8 Visit Report for 09/22/2023 Arrival Information Details Patient Name: Date of Service: [name] [facility], DO [place] LD 09/22/2023 1:45 PM Medical Record Number: [id number] Patient Account Number: [id number] Date of Birth/Sex: Treating RN: 10-30-1965 (56 y.o. [name] Primary Care Eulon Allnutt: Aram Beecham Other Clinician: Betha Loa Referring Constantinos Krempasky: Treating Agastya Meister/Extender: Gabriel Earing in Treatment: 25 Visit Information History Since Last Visit All ordered tests and consults were completed: No Patient Arrived: Wheel Chair Added or deleted any medications: No Arrival Time: 13:52 Any new allergies or adverse reactions: No Transfer Assistance: Transfer Board Had a fall or experienced change in No Patient Identification Verified: Yes activities of daily living that may affect Secondary Verification Process Completed: Yes risk of falls: Patient Requires Transmission-Based Precautions: No Signs or symptoms of abuse/neglect since last visito No Patient Has Alerts: No Hospitalized since last visit: No Implantable device outside of the clinic excluding No cellular tissue based products placed in the center since last visit: Has Dressing in Place as Prescribed: Yes Pain Present Now: No Electronic Signature(s) Signed: 09/23/2023 12:10:58 PM By: Betha Loa Entered By: Betha Loa on 09/22/2023 10:53:08 -------------------------------------------------------------------------------- Clinic Level of Care Assessment Details Patient Name: Date of ServiceEulas [name] [facility], [name] 09/22/2023 1:45 PM Medical Record Number: [id number] Patient Account Number: [id number] Date of Birth/Sex: Treating RN: 1965-11-20 (57 y.o. [name] Primary Care Ravin Bendall: Aram Beecham Other Clinician: Betha Loa Referring Sydna Brodowski: Treating Manmeet Arzola/Extender: Gabriel Earing in Treatment: 25 Clinic Level of Care Assessment Items TOOL 1 Quantity Score []  - 0 Use when EandM and Procedure is performed on INITIAL visit ASSESSMENTS - Nursing Assessment / Reassessment []  - 0 General Physical Exam (combine w/ comprehensive assessment (listed just below) when performed on new pt. evals) []  - 0 Comprehensive Assessment (HX, ROS, Risk Assessments, Wounds Hx, etc.) [name] ([id number]) [id number] Page 2 of 8 ASSESSMENTS - Wound and Skin Assessment / Reassessment []  - 0 Dermatologic / Skin Assessment (not related to wound area) ASSESSMENTS - Ostomy and/or Continence Assessment and Care []  - 0 Incontinence Assessment and Management []  - 0 Ostomy Care Assessment and Management (repouching, etc.) PROCESS - Coordination of Care []  - 0 Simple Patient / Family Education for ongoing care []  - 0 Complex (extensive) Patient / Family Education for ongoing care []  - 0 Staff obtains Chiropractor, Records, T Results / Process Orders est []  - 0 Staff telephones HHA, Nursing Homes / Clarify orders / etc []  - 0 Routine Transfer to another Facility (non-emergent condition) []  - 0 Routine Hospital Admission (non-emergent condition) []  - 0 New Admissions / Manufacturing engineer / Ordering NPWT Apligraf, etc. , []  - 0 Emergency Hospital Admission (emergent condition) PROCESS - Special Needs []  - 0 Pediatric / Minor Patient Management []  - 0 Isolation Patient Management []  - 0 Hearing / Language / Visual special needs []  - 0 Assessment of Community assistance (transportation, D/C planning, etc.) []  - 0 Additional assistance / Altered mentation []  - 0 Support Surface(s) Assessment (bed, cushion, seat, etc.) INTERVENTIONS - Miscellaneous []  - 0 External ear exam []  - 0 Patient Transfer (multiple staff / Nurse, adult / Similar devices) []  - 0 Simple Staple / Suture removal (25 or less) []  - 0 Complex Staple / Suture  removal (26 or more) []  - 0 Hypo/Hyperglycemic Management (do not check if billed separately) []  - 0 Ankle / Brachial Index (ABI) - do not check if billed separately  Has the patient been seen at the hospital within the last three years: Yes Total Score: 0 Level Of Care: ____ Electronic Signature(s) Signed: 09/23/2023 12:10:58 PM By: Betha Loa Entered By: Betha Loa on 09/22/2023 11:19:44 -------------------------------------------------------------------------------- Encounter Discharge Information Details Patient Name: Date of Service: [name] TH, [name] 09/22/2023 1:45 PM Medical Record Number: [id number] Patient Account Number: [id number] Date of Birth/Sex: Treating RN: 1966-05-14 (57 y.o. [name] Primary Care Patrese Neal: Aram Beecham Other Clinician: Betha Loa Referring Zacharius Funari: Treating Christle Nolting/Extender: Hermine Messick [place], [place] ([id number]) [id number] Page 3 of 8 Weeks in Treatment: 25 Encounter Discharge Information Items [name] Procedure Vitals Discharge Condition: Stable Temperature (F): 98.1 Ambulatory Status: Wheelchair Pulse (bpm): 99 Discharge Destination: Home Respiratory Rate (breaths/min): 18 Transportation: Other Blood Pressure (mmHg): 115/76 Accompanied By: self Schedule Follow-up Appointment: Yes Clinical Summary of Care: Electronic Signature(s) Signed: 09/23/2023 12:10:58 PM By: Betha Loa Entered By: Betha Loa on 09/22/2023 12:18:05 -------------------------------------------------------------------------------- Lower Extremity Assessment Details Patient Name: Date of ServiceEulas [name] [facility], [name] 09/22/2023 1:45 PM Medical Record Number: [id number] Patient Account Number: [id number] Date of Birth/Sex: Treating RN: Nov 17, 1965 (57 y.o. [name] Primary Care Rhonna Holster: Aram Beecham Other Clinician: Betha Loa Referring Saia Derossett: Treating  Kennede Lusk/Extender: Hermine Messick Weeks in Treatment: 25 Electronic Signature(s) Signed: 09/22/2023 4:27:20 PM By: Angelina Pih Signed: 09/23/2023 12:10:58 PM By: Betha Loa Entered By: Betha Loa on 09/22/2023 11:06:32 -------------------------------------------------------------------------------- Multi Wound Chart Details Patient Name: Date of Service: [name] TH, [name] 09/22/2023 1:45 PM Medical Record Number: [id number] Patient Account Number: [id number] Date of Birth/Sex: Treating RN: 1966/02/19 (57 y.o. [name] Primary Care Ashly Goethe: Aram Beecham Other Clinician: Betha Loa Referring Keydi Giel: Treating Francisca Harbuck/Extender: Hermine Messick Weeks in Treatment: 25 Vital Signs Height(in): Pulse(bpm): 99 Weight(lbs): 90 Blood Pressure(mmHg): 115/76 Body Mass Index(BMI): Temperature(F): 98.1 Respiratory Rate(breaths/min): 18 [Baley, Heitor ([id number]):Photos:] [phone].pdf Page 4 of 8:5 N/A N/A N/A N/A] Right, Midline Gluteus N/A N/A Wound Location: Gradually Appeared N/A N/A Wounding Event: Pressure Ulcer N/A N/A Primary Etiology: Cataracts, Glaucoma, Coronary Artery N/A N/A Comorbid History: Disease, Hypertension, History of pressure wounds, Osteoarthritis, Paraplegia 10/04/2021 N/A N/A Date Acquired: 25 N/A N/A Weeks of Treatment: Open N/A N/A Wound Status: No N/A N/A Wound Recurrence: 3.7x9x3.2 N/A N/A Measurements L x W x D (cm) 26.154 N/A N/A A (cm) : rea 83.692 N/A N/A Volume (cm) : -166.40% N/A N/A % Reduction in A rea: -468.30% N/A N/A % Reduction in Volume: 7 Position 1 (o'clock): 2.5 Maximum Distance 1 (cm): Yes N/A N/A Tunneling: Category/Stage IV N/A N/A Classification: Large N/A N/A Exudate A mount: Serosanguineous N/A N/A Exudate Type: red, brown N/A N/A Exudate Color: Medium (34-66%) N/A N/A Granulation A mount: Red, Pink N/A N/A Granulation  Quality: Medium (34-66%) N/A N/A Necrotic A mount: Fat Layer (Subcutaneous Tissue): Yes N/A N/A Exposed Structures: Muscle: Yes Bone: Yes Fascia: No Tendon: No Joint: No None N/A N/A Epithelialization: Treatment Notes Electronic Signature(s) Signed: 09/23/2023 12:10:58 PM By: Betha Loa Entered By: Betha Loa on 09/22/2023 11:06:37 -------------------------------------------------------------------------------- Multi-Disciplinary Care Plan Details Patient Name: Date of Service: [name] TH, [name] 09/22/2023 1:45 PM Medical Record Number: [id number] Patient Account Number: [id number] Date of Birth/Sex: Treating RN: November 29, 1965 (57 y.o. [name] Primary Care Teagen Mcleary: Aram Beecham Other Clinician: Betha Loa Referring Kadien Lineman: Treating Lillion Elbert/Extender: Gabriel Earing in Treatment: [address] Inactive Pressure [place], [place] ([id number]) [id number] Page 5 of 8 Nursing Diagnoses: Potential for  impaired tissue integrity related to pressure, friction, moisture, and shear Goals: Patient will remain free from development of additional pressure ulcers Date Initiated: 03/31/2023 Target Resolution Date: 07/01/2023 Goal Status: Active Interventions: Assess: immobility, friction, shearing, incontinence upon admission and as needed Assess offloading mechanisms upon admission and as needed Assess potential for pressure ulcer upon admission and as needed Notes: Wound/Skin Impairment Nursing Diagnoses: Knowledge deficit related to ulceration/compromised skin integrity Goals: Patient/caregiver will verbalize understanding of skin care regimen Date Initiated: 03/31/2023 Target Resolution Date: 07/01/2023 Goal Status: Active Ulcer/skin breakdown will have a volume reduction of 30% by week 4 Date Initiated: 03/31/2023 Date Inactivated: 06/02/2023 Target Resolution Date: 05/31/2023 Goal Status: Unmet Unmet Reason:  comorbidities Ulcer/skin breakdown will have a volume reduction of 50% by week 8 Date Initiated: 03/31/2023 Target Resolution Date: 07/01/2023 Goal Status: Active Ulcer/skin breakdown will have a volume reduction of 80% by week 12 Date Initiated: 03/31/2023 Target Resolution Date: 07/31/2023 Goal Status: Active Ulcer/skin breakdown will heal within 14 weeks Date Initiated: 03/31/2023 Target Resolution Date: 08/31/2023 Goal Status: Active Interventions: Assess patient/caregiver ability to obtain necessary supplies Assess patient/caregiver ability to perform ulcer/skin care regimen upon admission and as needed Assess ulceration(s) every visit Notes: Electronic Signature(s) Signed: 09/22/2023 4:27:20 PM By: Angelina Pih Signed: 09/23/2023 12:10:58 PM By: Betha Loa Entered By: Betha Loa on 09/22/2023 11:19:56 -------------------------------------------------------------------------------- Pain Assessment Details Patient Name: Date of Service: [name] TH, [name] 09/22/2023 1:45 PM Medical Record Number: [id number] Patient Account Number: [id number] Date of Birth/Sex: Treating RN: 1966-03-20 (57 y.o. [name] Primary Care Taquanna Borras: Aram Beecham Other Clinician: Betha Loa Referring Aedyn Mckeon: Treating Ludwin Flahive/Extender: Hermine Messick Weeks in Treatment: 25 Active Problems Location of Pain Severity and Description of Pain [place], [place] ([id number]) [id number] Page 6 of 8 Patient Has Paino No Site Locations Pain Management and Medication Current Pain Management: Electronic Signature(s) Signed: 09/22/2023 4:27:20 PM By: Angelina Pih Signed: 09/23/2023 12:10:58 PM By: Betha Loa Entered By: Betha Loa on 09/22/2023 10:56:56 -------------------------------------------------------------------------------- Patient/Caregiver Education Details Patient Name: Date of Service: [name] TH, [name]  12/19/2024andnbsp1:45 PM Medical Record Number: [id number] Patient Account Number: [id number] Date of Birth/Gender: Treating RN: 30-May-1966 (57 y.o. [name] Primary Care Physician: Aram Beecham Other Clinician: Betha Loa Referring Physician: Treating Physician/Extender: Gabriel Earing in Treatment: 25 Education Assessment Education Provided To: Patient Education Topics Provided Wound/Skin Impairment: Handouts: Other: continue wound care as directed Methods: Explain/Verbal Responses: State content correctly Electronic Signature(s) Signed: 09/23/2023 12:10:58 PM By: Betha Loa Entered By: Betha Loa on 09/22/2023 12:16:52 [name] ([id number]) [id number] Page 7 of 8 -------------------------------------------------------------------------------- Wound Assessment Details Patient Name: Date of ServiceEulas [name] [facility], [name] 09/22/2023 1:45 PM Medical Record Number: [id number] Patient Account Number: [id number] Date of Birth/Sex: Treating RN: May 31, 1966 (57 y.o. [name] Primary Care Matia Zelada: Aram Beecham Other Clinician: Betha Loa Referring Abimael Zeiter: Treating Raistlin Gum/Extender: Hermine Messick Weeks in Treatment: 25 Wound Status Wound Number: 5 Primary Pressure Ulcer Etiology: Wound Location: Right, Midline Gluteus Wound Open Wounding Event: Gradually Appeared Status: Date Acquired: 10/04/2021 Comorbid Cataracts, Glaucoma, Coronary Artery Disease, Hypertension, Weeks Of Treatment: 25 History: History of pressure wounds, Osteoarthritis, Paraplegia Clustered Wound: No Photos Wound Measurements Length: (cm) 3.7 Width: (cm) 9 Depth: (cm) 3.2 Area: (cm) 26.15 Volume: (cm) 83.69 % Reduction in Area: -166.4% % Reduction in Volume: -468.3% Epithelialization: None 4 Tunneling: Yes 2 Position (o'clock): 7 Maximum Distance: (cm) 2.5 Wound  Description Classification: Category/Stage IV Exudate Amount:  Large Exudate Type: Serosanguineous Exudate Color: red, brown Foul Odor After Cleansing: No Slough/Fibrino Yes Wound Bed Granulation Amount: Medium (34-66%) Exposed Structure Granulation Quality: Red, Pink Fascia Exposed: No Necrotic Amount: Medium (34-66%) Fat Layer (Subcutaneous Tissue) Exposed: Yes Tendon Exposed: No Muscle Exposed: Yes Necrosis of Muscle: No Joint Exposed: No Bone Exposed: Yes Treatment Notes Wound #5 (Gluteus) Wound Laterality: Right, Midline Cleanser [facility] [place] [facility] Ancillary Kit - [address] [place], [name] ([id number]) [id number] Page 8 of 8 Discharge Instruction: Use supplies as instructed; Kit contains: (15) Saline Bullets; (15) 3x3 Gauze; 15 pr Gloves Peri-Wound Care Topical Primary Dressing Hydrofera Blue Ready Transfer Foam, 4x5 (in/in) Discharge Instruction: Apply Hydrofera Blue Ready to wound bed as directed Secondary Dressing ABD Pad 5x9 (in/in) Discharge Instruction: Cover with ABD pad Secured With Medipore T - 57M Medipore H Soft Cloth Surgical T ape ape, 2x2 (in/yd) Compression Wrap Compression Stockings Add-Ons Electronic Signature(s) Signed: 09/22/2023 4:27:20 PM By: Angelina Pih Signed: 09/23/2023 12:10:58 PM By: Betha Loa Entered By: Betha Loa on 09/22/2023 11:05:53 -------------------------------------------------------------------------------- Vitals Details Patient Name: Date of Service: [name] TH, [name] 09/22/2023 1:45 PM Medical Record Number: [id number] Patient Account Number: [id number] Date of Birth/Sex: Treating RN: 02-21-1966 (57 y.o. [name] Primary Care Imberly Troxler: Aram Beecham Other Clinician: Betha Loa Referring Coyt Govoni: Treating Sharlet Notaro/Extender: Hermine Messick Weeks in Treatment: 25 Vital Signs Time Taken: 13:53 Temperature (F): 98.1 Weight (lbs): 90 Pulse (bpm):  99 Respiratory Rate (breaths/min): 18 Blood Pressure (mmHg): 115/76 Reference Range: 80 - 120 mg / dl Electronic Signature(s) Signed: 09/23/2023 12:10:58 PM By: Betha Loa Entered By: Betha Loa on 09/22/2023 10:56:51

## 2023-10-03 ENCOUNTER — Ambulatory Visit: Payer: 59 | Admitting: Internal Medicine

## 2023-10-12 ENCOUNTER — Encounter: Payer: 59 | Attending: Physician Assistant | Admitting: Physician Assistant

## 2023-10-12 DIAGNOSIS — I129 Hypertensive chronic kidney disease with stage 1 through stage 4 chronic kidney disease, or unspecified chronic kidney disease: Secondary | ICD-10-CM | POA: Diagnosis not present

## 2023-10-12 DIAGNOSIS — N183 Chronic kidney disease, stage 3 unspecified: Secondary | ICD-10-CM | POA: Insufficient documentation

## 2023-10-12 DIAGNOSIS — N319 Neuromuscular dysfunction of bladder, unspecified: Secondary | ICD-10-CM | POA: Diagnosis not present

## 2023-10-12 DIAGNOSIS — G8220 Paraplegia, unspecified: Secondary | ICD-10-CM | POA: Insufficient documentation

## 2023-10-12 DIAGNOSIS — Z933 Colostomy status: Secondary | ICD-10-CM | POA: Insufficient documentation

## 2023-10-12 DIAGNOSIS — L89314 Pressure ulcer of right buttock, stage 4: Secondary | ICD-10-CM | POA: Diagnosis present

## 2023-10-12 DIAGNOSIS — M1990 Unspecified osteoarthritis, unspecified site: Secondary | ICD-10-CM | POA: Diagnosis not present

## 2023-10-12 DIAGNOSIS — L89323 Pressure ulcer of left buttock, stage 3: Secondary | ICD-10-CM | POA: Insufficient documentation

## 2023-10-12 DIAGNOSIS — Z982 Presence of cerebrospinal fluid drainage device: Secondary | ICD-10-CM | POA: Insufficient documentation

## 2023-10-12 DIAGNOSIS — Q760 Spina bifida occulta: Secondary | ICD-10-CM | POA: Diagnosis not present

## 2023-10-12 DIAGNOSIS — I2510 Atherosclerotic heart disease of native coronary artery without angina pectoris: Secondary | ICD-10-CM | POA: Insufficient documentation

## 2023-10-12 NOTE — Progress Notes (Addendum)
 Burdette, [name] ([id number]) [id number] Page 1 of 14 Visit Report for 10/12/2023 Chief Complaint Document Details Patient Name: Date of Service: [name] [facility], [name] 10/12/2023 2:00 PM Medical Record Number: [id number] Patient Account Number: [id number] Date of Birth/Sex: Treating RN: 14-Feb-1966 (57 y.o. [name]) Alverta Sailors Primary Care Provider: Auston Purchase Other Clinician: Purcell Sniff Referring Provider: Treating Provider/Extender: Bethena Andre Auston Purchase Devra in Treatment: 27 Information Obtained from: Patient Chief Complaint Right gluteal to midline pressure ulcer with chronic osteomyelitis Electronic Signature(s) Signed: 10/12/2023 2:58:51 PM By: Bethena Andre PA-C Previous Signature: 10/12/2023 2:14:19 PM Version By: Bethena Andre PA-C Entered By: Bethena Andre on 10/12/2023 14:58:50 -------------------------------------------------------------------------------- Debridement Details Patient Name: Date of Service: [name] [facility], [name] 10/12/2023 2:00 PM Medical Record Number: [id number] Patient Account Number: [id number] Date of Birth/Sex: Treating RN: Nov 06, 1965 (57 y.o. [name] Alverta Sailors Primary Care Provider: Auston Purchase Other Clinician: Purcell Sniff Referring Provider: Treating Provider/Extender: Bethena Andre Auston Purchase Devra in Treatment: 27 Debridement Performed for Assessment: Wound #5 Right,Midline Gluteus Performed By: Physician Bethena Andre, PA-C The following information was scribed by: Purcell Sniff The information was scribed for: Bethena Andre Debridement Type: Debridement Level of Consciousness (Pre-procedure): Awake and Alert Pre-procedure Verification/Time Out Yes - 14:38 Taken: Start Time: 14:38 Percent of Wound Bed Debrided: 100% T Area Debrided (cm): otal 30.73 Tissue and other material debrided: Viable, Non-Viable, Slough, Subcutaneous, Biofilm, Slough Level: Skin/Subcutaneous Tissue Debridement Description:  Excisional Instrument: Curette Bleeding: Moderate Hemostasis Achieved: Pressure Response to Treatment: Procedure was tolerated well Chase [name] ([id number]) [id number] Page 2 of 14 Level of Consciousness (Post- Awake and Alert procedure): Post Debridement Measurements of Total Wound Length: (cm) 4.5 Stage: Category/Stage IV Width: (cm) 8.7 Depth: (cm) 1.6 Volume: (cm) 49.197 Character of Wound/Ulcer Post Debridement: Stable Post Procedure Diagnosis Same as Pre-procedure Electronic Signature(s) Signed: 10/12/2023 3:46:47 PM By: Alverta Sailors RN Signed: 10/12/2023 5:49:46 PM By: Purcell Sniff Signed: 10/12/2023 6:16:28 PM By: Bethena Andre PA-C Entered By: Purcell Sniff on 10/12/2023 14:40:30 -------------------------------------------------------------------------------- HPI Details Patient Name: Date of Service: [name] TH, [name] 10/12/2023 2:00 PM Medical Record Number: [id number] Patient Account Number: [id number] Date of Birth/Sex: Treating RN: 02/02/1966 (57 y.o. [name] Alverta Sailors Primary Care Provider: Auston Purchase Other Clinician: Purcell Sniff Referring Provider: Treating Provider/Extender: Bethena Andre Auston Purchase Devra in Treatment: 27 History of Present Illness HPI Description: 58 year old male with a history of spina bifida presenting to us  with a history of a open wound on the left gluteal region near his upper thigh which she's had for several weeks. He was seen in the ED recently at [facility] and was put on doxycycline  and was advised some local care. his past medical history is significant for spinal bifid, neurogenic bladder, kidney stones, sacral pressure sores, constipation. Past surgical history significant for partial cystectomy, ileal conduit, VP shunt removal, percutaneous nephro lithotripsy. In the remote past the patient says he's had some treatment for a ulcer on this area and was treated with a skin  graft. Readmission: 10/16/2021 upon evaluation today patient appears to be doing somewhat poorly in regard to a fairly large wound noted over the right ischial tuberosity location. This is a stage IV pressure ulcer. Of note this is also positive for osteomyelitis upon a CT scan which was performed during the time that he was admitted in the hospital on 09/19/2021. With that being said it is noted in the right inferior buttock that he has a decubitus ulcer which extends to the  posterior toe inferior right ischium where there is evidence of osteomyelitis. When he was here in the office actually missed that this was positive I missed read it and thought it said that there was no osteomyelitis. That is not the case there is in fact osteomyelitis noted here. I actually did review his notes as well in epic. Looking to the discharge summary it appears that the patient was admitted from 09/19/2021 through 09/23/2021. He was discharged home with home health care according to the note. With that being said from what I understand his sister actually takes care of him at this point. He was also to follow-up with a primary care provider and here at the wound care center within a week. I am just now seeing him on the 13th almost a month after discharge. He does have a history of again osteomyelitis of this right hip/ischial tuberosity location. 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 The reason for his admission was actually worsening of the decubitus ulcer upon admission. He does have chronic osteomyelitis of this area. He was treated with empiric antibiotics he had acute kidney injury which improved with IV fluids he also had hypokalemia. Surgical debridement was performed by general surgery at that time and ID was consulted to assist with management according to  the notes. IV antibiotics were recommended but patient declined and wanted oral antibiotics at discharge. Therefore no IV antibiotics were planned for discharge time. This case was under the care of Dr. Searcy while he was in the hospital when she did discharge him with a 2-week course of doxycycline  and Augmentin  according to the notes. It looks like Dr. Auston office did call and leave a message for the patient to get scheduled for a follow-up visit after hospital discharge I do not see where he showed up in fact it appears that the patient has a no-show letter from Dr. Auston office noted in epic due to a appointment that was missed on 10/06/2021. Looking at the pictures from Dr. Rodell notes on 09/23/2021 it does appear that the wound is a little bit better as far as the cleanliness of the surface of the wound today but nonetheless still is a very significant wound. 10/26/2021 upon evaluation today patient appears to be doing okay in regard to his wound. Again this is a wound that he did indeed have osteomyelitis and previous. With that being said I do not see any signs of a worsening infection at this time which is good news. Overall I think that we are headed in the right direction although still I think he needs more aggressive offloading. 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 I do believe this would be ideal for him to be honest. Readmission: KEILON, RESSEL ([id number]) [id number] Page 3 of 14 01-15-2022 upon evaluation today patient presents for reevaluation he was last seen on October 26, 2021. At that time unfortunately he was having issues with a significant ischial pressure ulcer on the right ischial tuberosity. Subsequently he ended up having decent response to the Dakin's moistened gauze dressing that was previously being utilized and subsequently the patient's sister who is his primary caregiver as well states that she  decided just to try to manage this at home. Nonetheless unfortunately this is getting a little larger not better.  He does spend a lot of the day in his chair. He does have an air mattress according what they tell me today. That was previously ordered. With that being said I do believe that at this time things do appear to be doing much better from the standpoint of infection I do not see any signs of overt infection. There is also no evidence of systemic infection. No fevers, chills, nausea, vomiting, or diarrhea. With that being said I do believe based on what I am seeing the wound is a bit cleaner and he possibly could be an excellent candidate for a wound VAC. 02-05-2022 upon evaluation today patient appears to be doing about the same in regard to his wound. Unfortunately he is not any better but he also really has not had the wound VAC on sufficiently. There is been some confusion with the orders part of that I think is our follow-up with the way the order was written part of it may be with how the wound VAC is being placed either way we need to see what we can do to try to improve things in this regard. We will clarify the orders today going forward for home health. Unfortunately the patient does have a new wound today which is on the opposite side. This is not good 1 was bad enough have another area to open is definitely not a good thing. Its not as deep but nonetheless is also not very shallow either. We may potentially be able to get this to the point that we could bridge the 2 together in order to wound VAC both but right now want a focus on to try to get the wound VAC going for the main wound initially we will likely use Hydrofera Blue on the new wound.[name] 02-19-2022 upon evaluation today patient's wound appears to be doing a little bit worse in the way of maceration fortunately there is no signs of active infection locally or systemically which is great news. No fevers, chills, nausea, vomiting,  or diarrhea. 03-05-2022 upon evaluation today patient unfortunately does not appear to be doing nearly as good in regard to his wounds as I would like to see. Fortunately I do not see any evidence of active infection that is obvious although I am can obtain a wound culture to ensure that there is not anything getting worse here as far as the wound without the wound VAC on his concern. With that being said I will go ahead and get this sent in and evaluated will initiate treatment as needed going forward if necessary. With regard to the wound with the wound VAC this continues to be placed directly over the wound and there is obvious signs of deep tissue injury. The suction being here means that he is sitting on it I think this may be part of the reason why it is coming off although not 100% certain. 03-12-2022 upon evaluation today patient still has not gotten the antibiotics that were called in for him 4 days ago. He tells me that his sister just told him this morning that they were ready but that she has not had a chance to go pick them up. Nonetheless I feel like that this is really something he needs any should have been taken that not the other antibiotics which were not doing the job for him to be honest. The patient states and I completely agree that there is really no way he would have known relying on his sister to let  him know obviously. Nonetheless I do believe that he needs to not be taking any other antibiotics and be on the Levaquin as soon as possible. He tells me that she said she was going to pick them up today. In regard to home health I am hopeful that they actually have been bridging this now. I do think that the wound looks better and this is good news. With that being said I am not certain that I can really tell for sure how this was attached it was removed before he came in and the black foam left in place. I really want him to leave the wound VAC in place until he comes in so that we  can monitor and make sure that this is being done correctly he voiced understanding. 03-19-2022 upon evaluation today and much happier with how things appear as far as the patient's wounds are concerned. I think he is on a much better track and they are doing a better job at bridging although its not quite where I like to see it is still more posterior which I think the patient needs to be a little bit more lateral on his side. Either way this is something that I did marked with a small dot today for him to tell the home health nurses well so she can bridge it to that location also think the tubing should go up instead of going down to just make it less cumbersome overall for him. 04-19-2022 upon evaluation today patient appears to be doing a little better in regard to his wounds. Fortunately I do not see any signs of infection I do believe he is on the right track. The wound VAC does seem to be helping to clean the wound up a bit and bring it in from the base at work. This is good news. 05-10-2022 upon evaluation today patient appears to be doing well currently in regard to his wounds in general in fact of the smaller of the 2 wounds without the wound VAC actually appears to be doing so much better this is almost completely closed and very pleased in that regard. He has been staying off of this he tells me and shows. With that being said the wound with the wound VAC is going require some sharp debridement there is some tissue which is not quite as healthy and we are going to go ahead and continue that for probably 2 more weeks although I think we may be closer to discontinuing this as well since it has healed and quite significantly. 05-24-2022 upon evaluation today patient appears to be doing well with regard to his wound in fact this looks better than I have been expected. This actually I think is a good time to go ahead and switch away from the wound VAC based on what I am seeing. I think we go to  Hydrofera Blue with a bordered foam dressing. 06-21-2022 upon evaluation today patient appears to be doing well currently in regard to his wound this is measuring smaller and looks well. Fortunately there does not appear to be any signs of active infection locally or systemically at this time which is great news. No fevers, chills, nausea, vomiting, or diarrhea. 07-29-2022 upon evaluation today patient appears to be doing well currently in regard to his wound. This is actually measuring smaller although there is a lot of hypergranulation noted. Fortunately there does not appear to be any signs of active infection locally or systemically at this time. No  fevers, chills, nausea, vomiting, or diarrhea. 11/9; pressure ulcer on the right buttock in the setting of spina bifida and lower extremity paralysis. He has been using Hydrofera Blue 09-02-2022 upon evaluation today patient appears to be doing well currently in regard to his wound. We have been using Hydrofera Blue this is showing signs of improvement as far as getting smaller is concerned. There is some drainage around the edges of the wound but I think a lot of this is due to the fact he did not even have a dressing on that he came in today. 12/29; right buttock pressure area which was initially stage IV. This is in the setting of a patient with spina bifida spends most of his time in a wheelchair. I am not certain how much he is offloading this. He has been using for Hydrofera Blue as a primary dressing. When he was here the last visit he was felt to have hyper granulated tissue and was treated with silver nitrate chemical cauterization 10-14-2022 upon evaluation today patient appears to be doing poorly in regard to the wound in the right ischial location. Unfortunately he tells me that the dressing seems to be falling off frequently. Unfortunately I think that this has contributed to him developing an infection which appears to be exemplified  by the fact that he has bone exposed which is actually necrotic and working its way out of the wound up and have to perform a fairly significant debridement today it does appear. I think that he needs to have these dressings ensure that they are in place at all times. Also discussed with the patient that we will get a need to do some testing in order to see if he indeed has an infection. If he does have a bone infection which I think is pretty likely to be osteomyelitis of this ischial location. I discussed that with the patient in detail today and depending on what we see him and make any adjustments in care as far as going forward is concerned. Obviously depressed antibiotic that we gave him that he tells me was somewhat irritating as far as the stomach side effects are concerned. He states he really would not want to be on that again. Either way I feel he is probably going to be placed on something once we get the results of the culture back. I am honestly concerned about the fact that he comes into the clinic without any dressings in place I am not sure how often they are really staying where they need to be as far as this wound is concerned. I actually cannot remember seeing him anytime in the past 2 months or so at least that he came in with a dressing intact. 10-21-2022 upon evaluation today patient presents for initial inspection here in our clinic concerning issues that he has been having with a wound in the sacral area. Again last time he was here I did debride away necrotic bone which was confirmed on pathology although it was not necessarily confirmed that this was secondary to osteomyelitis on the pathology report. Also the x-ray did not neither confirm nor deny the presence of osteomyelitis and his culture unfortunately showed multiple organisms with nothing predominance of this does not help to rule and tailor down exactly what is going on here. Fortunately I do not see any signs of  active infection systemically which is great news. With that being said locally I am definitely seeing some issues here. In fact this is  warm to touch around the sacral area and I think that cellulitis is definitely present I just cannot pinpoint the exact organism. With that being said I did discuss with the patient as well today that I do believe that he probably needs an MRI in order to further quantify the extent of her likely osteomyelitis and this again is something that we are going to order this will need to be without contrast due to the fact that he is allergic to contrast dye. Sedgwick, [name] ([id number]) [id number] Page 4 of 14 11-18-2022 upon evaluation today patient's wound is actually showing some signs of improvement currently. We have been doing the Dakin's moistened gauze dressing which I think is helping to clean up the wound for seeing some granulation growing and still the tissue is not quite as healthy as I would love to see but better than previous. He does have osteomyelitis but the good news is he seems to be treated well with the antibiotics he does have a refill that was just picked up and I think that he needs to continue to take that for certain. 3/7; patient is on a 6-week clinic hiatus. He lives at home with his sister however she is not at home all the time. He transfers out of bed and a sliding board. He claims he is only in the in the wheelchair about 4 5 hours. He does have underlying osteomyelitis I am not sure about the status of the antibiotics here. Part of the wound still easily probes to bone 12-23-2022 upon evaluation today patient appears to be doing well currently in regard to his wound all things considered there is still a lot of hypergranulation but I do feel like that with the Vashe moistened gauze is actually doing better than he was previously with the Hydrofera Blue. This just did not seem to be staying in place and with the  Vashe through able to change this more frequently. Readmission: 03-31-2023 this is a gentleman whom I have seen multiple times intermittently throughout the years compliance has been a real issue however. I do feel like there is some significant issues here still with the fact that dressing changes and keeping dressings in place are still pretty much as a significant problem here for us . I do not see any signs of active infection at this time which is great news but at the same time the wounds do not worse in fact he has another wound different than what I even noted previous. Subsequently he I do believe that the patient should continue to do monitor for any evidence of infection or worsening. Overall I think he needs to have regular follow-up visits with us  and I think that he needs to have dressings in place at all times. He tells me that he is at home primarily by himself transferring himself from the first thing in the morning through at least 3 in the afternoon as his sister works he really needs somebody gets a caregiver with him throughout the day. 04-12-2023 upon evaluation today patient appears to be doing a little worse currently in regard to his wounds. He has been tolerating the dressing changes without complication. Fortunately I do not see any evidence of active infection locally nor systemically at this point. 04-21-2023 upon evaluation today patient's wound unfortunately appears to have purulent drainage and also there is a malodorous drainage at this point. Unfortunately I think that there is something going on here that does not seem to be doing  nearly as good as what we would like to see. I think that he is likely going to require some antibiotics. He has taken Cipro  before without complication. 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 On 7/18 he had purulent drainage he had a PCR culture and is now on Cipro  he is tolerating this well. Using  Hydrofera Blue's a 2 bit and border foam his sister is changing the dressing After his sister goes to work the patient is apparently in a wheelchair all day. There is no way to heal the wound like this with that degree of prolonged pressure. 05-12-2023 upon evaluation today patient unfortunately appears to be doing much worse than what we previously seen. Last time he was here he was seen by Dr. Rufus. This was shortly after I put him on Cipro  he was tolerating that well. Nonetheless he did not show up last week apparently there was a transportation issue. This week upon arrival this wound appears to be doing significantly worse there is a significant malodorous drainage and this is deeper he has erythema in the gluteal region around it appears to be infected and to be honest despite the Cipro  and oral antibiotics he seems to be doing worse not better. 05-19-2023 upon evaluation patient appears to be doing better than last time I saw him with regard to his wound in the right gluteal location. Fortunately there does not appear to be any signs of systemic infection though he does have evidence of osteomyelitis noted which were acute on chronic and that was the findings that were noted in the hospital when he was admitted most recently after I sent him from May 12, 2023 through May 17, 2023. The patient was treated with IV Zosyn  and bank while in the hospital and discharged on Augmentin  although that has not been picked up yet it has been 2 days since he was discharged he states that the pharmacy never called his sister to let her know it was ready. Nonetheless this needs to be gotten ASAP to make sure that he is fully treated as far as the infection is concerned I discussed that with him today as well. 05-26-2023 upon evaluation today patient appears to be doing better today with regard to his wounds compared to what we have noticed previous. Fortunately I do not see any evidence of active infection  locally or systemically which is great news and in general I do believe that we are making headway towards complete closure. With that being said we still had some trouble getting the order done for the trapeze and aids to help him with regard to his daily activities and preventing the dressings from coming off. We have reached out to his CAP Worker and have not gotten in touch with her as of yet. I did recommend that the patient try to see. He or his sister to get in touch with Angelique and see if they can help us  in that regard. 8/29; patient with 2 wounds in close juxtaposition on the right buttock. Both of these probes very close to the bone especially the area over the ischial tuberosity. Patient was recently in the hospital for additional antibiotics I did not look at this today. We are using Dakin's wet to dry. His sister changes in dressings. The wounds are measuring larger. 06-16-2023 upon evaluation today patient appears to be doing worse currently at this time. Fortunately I do not see any signs  of infection which is good news but at the same time he does have deep tissue injury which has me concerned about potential further breakdown here. He does tell me that he has been up on it a little bit more. 07-12-2023 upon evaluation today patient appears to be doing okay in regard to his wound there is actually an area that is merged between the 2 and therefore this is really just 1 wound we converted it as such. There is no separation between the 2. I knew that this was going to happen it was just a matter of time. With that being said I do not see any signs of infection obvious at this point but I do believe that this is still an issue with him keeping the dressing in place he tells me it is not staying in place which does have me concerned as well. Fortunately I do not see any evidence of worsening overall systemically though locally I am a little concerned about how the wounds have digressed  since I last saw him. 07-28-2023 upon evaluation today patient unfortunately appears to be doing worse with a new wound at this point on the left side that was not there last time I saw him. He has had missed visits in the past 4 visits he has missed 2 of those unfortunately. This again was a condition about seeing him back that he had to make sure that he showed up for all visits. That is not happening at the moment and I have discussion with him today about that. I discussed that if he is not going to show up for his visits that we are going to have to discharge him that was the original agreement and that is still where things stand at this point to be honest. The patient voiced understanding. I told him that we are going to give him 1 more shot today before we would proceed to anything like that but again if he is not coming to the appointments as directed that there is really no point and has continued to have them on the books as far as trying to get this healed is concerned. He voiced understanding. He still tells me that his dressings were also not staying in place in fact he tells me that every time he gets out of the bed the dressing is already off. He came in today with no dressing on. Noncompliance has been a significant issue through the course of the treatment with this gentleman and still continues to be so. 08-04-2023 upon evaluation today patient's wound actually appears to be doing better than it has for quite some time. I am actually seeing some improvement which is good news and in general I do believe that we will make an some headway here. I do not see any signs of obvious infection there is also no need for obvious debridement today. Overall I think that we are on the right track. He did actually have a dressing in place upon arrival today. 08-15-2023 upon evaluation today patient appears to be doing well currently in regard to his wound. He actually looks much better he has been  in the hospital since I last saw him. He tells me that his sister got concerned about the appearance of his wound he was there where they did aggressive offloading and put him on IV antibiotics as well he seems to be doing much better. 08-29-2023 upon evaluation today the patient unfortunately presents without a dressing in place.  When I inquired as to when the dressing came off his response was well I am really not sure what is today. When I questioned further about this he told me that I guess it probably came off sometime yesterday. Subsequently I am very frustrated with the fact that the patient really does not seem to be keeping these dressings in place have had multiple conversations with him many different times about how this needed to stay in place how not have been in place runs the risk of infection as well as getting other material into the wound as well as not allowing the wound to even heal appropriately. All of which are bad things. Again this has been an ongoing issue with [name], [name] ([id number]) [id number] Page 5 of 14 noncompliance from his last admission to even this 1 I have given him a lot of grace and leeway on a lot of things including missed appointments for various reasons which were not supposed to happen when he came back. Nonetheless I told him today unequivocally that if he shows up again with a dressing not in place that I will initiate discharge as of that date he will have 30 days to find a new provider. I explained this is not punitive but simply the fact that I cannot work with him to heal the wound contractual he if he is not going to keep the dressings in place and ensure that it is replaced whenever needed. 09-12-2023 upon evaluation today patient appears to be doing worse in regard to his wound. Unfortunately he has been still having issues with keeping his dressings in place. I think he is can have to have this done 2 times a  day going forward. It is going to have to be diligently treated in order to make sure this has any chance of healing he has obvious signs of pressure injury today which is unfortunate this is worse than last time I saw him. With that being said he also is not keeping dressings in place which is a significant issue here. 09-22-2023 upon evaluation today patient appears to be doing a little better in regard to his wound. I am actually going to perform some debridement clearway necrotic debris he is in agreement with the plan we will get a try to see about getting Hydrofera Blue he is doing well with this in the past and in fact we were able to get one of his wounds completely healed with use of the Hydrofera Blue. This has been in the past. 10-12-23 upon evaluation today patient's wound is showing signs of good improvement. I feel like he is slowly making progress here which is good news. Fortunately I do not see any evidence of worsening overall and I believe that the patient is making better headway here towards closure compared to where he has been previous. Electronic Signature(s) Signed: 10/12/2023 2:50:45 PM By: Bethena Ferraris PA-C Entered By: Bethena Ferraris on 10/12/2023 14:50:44 -------------------------------------------------------------------------------- Physical Exam Details Patient Name: Date of Service: [name] [facility], [name] 10/12/2023 2:00 PM Medical Record Number: [id number] Patient Account Number: [id number] Date of Birth/Sex: Treating RN: Nov 13, 1965 (57 y.o. [name] Alverta Sailors Primary Care Provider: Auston Purchase Other Clinician: Purcell Sniff Referring Provider: Treating Provider/Extender: Bethena Ferraris Auston Purchase Weeks in Treatment: 28 Constitutional Well-nourished and well-hydrated in no acute distress. Respiratory normal breathing without difficulty. Psychiatric this patient is able to make decisions and demonstrates good insight into disease process. Alert and Oriented x 3.  pleasant and  cooperative. Notes Upon inspection patient's wound bed actually showed signs of good granulation and epithelization at this point. Fortunately I do not see any signs of infection locally or systemically which is great news and in general I do believe that were making headway here towards getting this wound closed I think that he still has some issues with pressure he tells me sometimes it is kind of dark and discolored which I think that is what is happening at that point. With that being said right now I do not see any signs of discoloration that would indicate deep tissue injury which is good news. Electronic Signature(s) Signed: 10/12/2023 2:51:16 PM By: Bethena Ferraris PA-C Entered By: Bethena Ferraris on 10/12/2023 14:51:16 [name] ([id number]) [id number] Page 6 of 14 -------------------------------------------------------------------------------- Physician Orders Details Patient Name: Date of ServiceBETHA [name] [facility], [name] 10/12/2023 2:00 PM Medical Record Number: [id number] Patient Account Number: [id number] Date of Birth/Sex: Treating RN: Jun 23, 1966 (57 y.o. [name]) Alverta Sailors Primary Care Provider: Auston Purchase Other Clinician: Purcell Sniff Referring Provider: Treating Provider/Extender: Bethena Ferraris Auston Purchase Devra in Treatment: 27 The following information was scribed by: Purcell Sniff The information was scribed for: Bethena Ferraris Verbal / Phone Orders: No Diagnosis Coding ICD-10 Coding Code Description M86.68 Other chronic osteomyelitis, other site L89.314 Pressure ulcer of right buttock, stage 4 L89.323 Pressure ulcer of left buttock, stage 3 Q76.0 Spina bifida occulta Z93.3 Colostomy status I10 Essential (primary) hypertension I25.10 Atherosclerotic heart disease of native coronary artery without angina pectoris Follow-up Appointments Return Appointment in 1 week. Bathing/ [employer] wounds with antibacterial  soap and water. Anesthetic (Use 'Patient Medications' Section for Anesthetic Order Entry) Lidocaine  applied to wound bed Off-Loading Gel wheelchair cushion Hospital bed/mattress - ETHOS Low air-loss mattress (Group 2) - HOSPITAL BED WITH GROUPE 2 AIR MATTRESS AND TRAPEZE BAR ETHOS Turn and reposition every 2 hours Additional Orders / Instructions Other: - apply calmoseptine or AandD ointment to left glut area daily for protection change dressing every morning and every evening Wound Treatment Wound #5 - Gluteus Wound Laterality: Right, Midline Cleanser: Byram Ancillary Kit - 15 Day Supply (Generic) 1 x Per Day/30 Days Discharge Instructions: Use supplies as instructed; Kit contains: (15) Saline Bullets; (15) 3x3 Gauze; 15 pr Gloves Prim Dressing: Hydrofera Blue Ready Transfer Foam, 4x5 (in/in) (Dispense As Written) 1 x Per Day/30 Days ary Discharge Instructions: spiral a piece of blue and gently pack into wound, then cover with another piece Secondary Dressing: Zetuvit Plus 4x4 (in/in) 1 x Per Day/30 Days Secured With: Medipore T - 66M Medipore H Soft Cloth Surgical T ape ape, 2x2 (in/yd) (Dispense As Written) 1 x Per Day/30 Days Electronic Signature(s) Signed: 10/12/2023 5:49:46 PM By: Purcell Sniff Signed: 10/12/2023 6:16:28 PM By: Bethena Ferraris PA-C Entered By: Purcell Sniff on 10/12/2023 17:48:31 [name] ([id number]) [id number] Page 7 of 14 Prescription 10/12/2023 -------------------------------------------------------------------------------- [name] Bethena Ferraris PA-C Patient Name: Provider: Jul 09, 1966 [id number] Date of Birth: NPI#: [name] [id number] Sex: DEA #: [phone] Phone #: License #: UPN: Patient Address: [address] [facility] [address] [facility] [place], [place] [address] [address], [address] [place], [place]  [address] [phone] Allergies Iodinated Contrast Media; Sulfa (Sulfonamide Antibiotics); latex; morphine  Provider's Orders Hospital bed/mattress - ETHOS Hand Signature: Date(s): Prescription 10/12/2023 [name] Bethena Ferraris PA-C Patient Name: Provider: 11-Apr-1966 [id number] Date of Birth: NPI#: [name] [id number] Sex: DEA #: [phone] Phone #: License #: UPN:  Patient Address: [address] [facility] 7 [facility] [address] [facility] [place], [place] [address] [address], [address] [place], [place] [address] [phone] Allergies Iodinated Contrast Media; Sulfa (Sulfonamide Antibiotics); latex; morphine  Provider's Orders Low air-loss mattress (Group 2) - HOSPITAL BED WITH GROUPE 2 AIR MATTRESS AND TRAPEZE BAR ETHOS Hand Signature: Date(s): Electronic Signature(s) Signed: 10/12/2023 5:49:46 PM By: Purcell Sniff Signed: 10/12/2023 6:16:28 PM By: Bethena Ferraris PA-C Entered By: Purcell Sniff on 10/12/2023 17:48:32 [name] ([id number]) [id number] Page 8 of 14 -------------------------------------------------------------------------------- Problem List Details Patient Name: Date of ServiceBETHA [name] [facility], [name] 10/12/2023 2:00 PM Medical Record Number: [id number] Patient Account Number: [id number] Date of Birth/Sex: Treating RN: Apr 28, 1966 (57 y.o. [name]) Alverta Sailors Primary Care Provider: Auston Purchase Other Clinician: Purcell Sniff Referring Provider: Treating Provider/Extender: Bethena Ferraris Auston Purchase Devra in Treatment: 27 Active Problems ICD-10 Encounter Code Description Active Date MDM Diagnosis M86.68 Other chronic osteomyelitis, other site 05/19/2023 No Yes L89.314 Pressure ulcer of right buttock, stage 4 03/31/2023 No Yes Q76.0 Spina bifida occulta 03/31/2023 No Yes Z93.3 Colostomy status 03/31/2023 No Yes I10 Essential (primary) hypertension 03/31/2023 No  Yes I25.10 Atherosclerotic heart disease of native coronary artery without angina pectoris 03/31/2023 No Yes Inactive Problems Resolved Problems ICD-10 Code Description Active Date Resolved Date L89.323 Pressure ulcer of left buttock, stage 3 07/28/2023 07/28/2023 Electronic Signature(s) Signed: 10/12/2023 2:58:26 PM By: Bethena Ferraris PA-C Previous Signature: 10/12/2023 2:14:16 PM Version By: Bethena Ferraris PA-C Entered By: Bethena Ferraris on 10/12/2023 14:58:26 [name] ([id number]) [id number] Page 9 of 14 -------------------------------------------------------------------------------- Progress Note Details Patient Name: Date of ServiceBETHA [name] [facility], [name] 10/12/2023 2:00 PM Medical Record Number: [id number] Patient Account Number: [id number] Date of Birth/Sex: Treating RN: June 23, 1966 (57 y.o. [name]) Alverta Sailors Primary Care Provider: Auston Purchase Other Clinician: Purcell Sniff Referring Provider: Treating Provider/Extender: Bethena Ferraris Auston Purchase Devra in Treatment: 27 Subjective Chief Complaint Information obtained from Patient Right gluteal to midline pressure ulcer with chronic osteomyelitis History of Present Illness (HPI) 58 year old male with a history of spina bifida presenting to us  with a history of a open wound on the left gluteal region near his upper thigh which she's had for several weeks. He was seen in the ED recently at [facility] and was put on doxycycline  and was advised some local care. his past medical history is significant for spinal bifid, neurogenic bladder, kidney stones, sacral pressure sores, constipation. Past surgical history significant for partial cystectomy, ileal conduit, VP shunt removal, percutaneous nephro lithotripsy. In the remote past the patient says he's had some treatment for a ulcer on this area and was treated with a skin graft. Readmission: 10/16/2021 upon evaluation today patient appears to be  doing somewhat poorly in regard to a fairly large wound noted over the right ischial tuberosity location. This is a stage IV pressure ulcer. Of note this is also positive for osteomyelitis upon a CT scan which was performed during the time that he was admitted in the hospital on 09/19/2021. With that being said it is noted in the right inferior buttock that he has a decubitus ulcer which extends to the posterior toe inferior right ischium where there is evidence of osteomyelitis. When he was here in the office actually missed that this was positive I missed read it and thought it said that there was no osteomyelitis. That is not the case there is in fact osteomyelitis noted here. I actually  did review his notes as well in epic. Looking to the discharge summary it appears that the patient was admitted from 09/19/2021 through 09/23/2021. He was discharged home with home health care according to the note. With that being said from what I understand his sister actually takes care of him at this point. He was also to follow-up with a primary care provider and here at the wound care center within a week. I am just now seeing him on the 13th almost a month after discharge. He does have a history of again osteomyelitis of this right hip/ischial tuberosity location. 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 The reason for his admission was actually worsening of the decubitus ulcer upon admission. He does have chronic osteomyelitis of this area. He was treated with empiric antibiotics he had acute kidney injury which improved with IV fluids he also had hypokalemia. Surgical debridement was performed by general surgery at that time and ID was consulted to assist with management according to the notes. IV antibiotics were recommended but patient declined and wanted  oral antibiotics at discharge. Therefore no IV antibiotics were planned for discharge time. This case was under the care of Dr. Searcy while he was in the hospital when she did discharge him with a 2-week course of doxycycline  and Augmentin  according to the notes. It looks like Dr. Auston office did call and leave a message for the patient to get scheduled for a follow-up visit after hospital discharge I do not see where he showed up in fact it appears that the patient has a no-show letter from Dr. Auston office noted in epic due to a appointment that was missed on 10/06/2021. Looking at the pictures from Dr. Rodell notes on 09/23/2021 it does appear that the wound is a little bit better as far as the cleanliness of the surface of the wound today but nonetheless still is a very significant wound. 10/26/2021 upon evaluation today patient appears to be doing okay in regard to his wound. Again this is a wound that he did indeed have osteomyelitis and previous. With that being said I do not see any signs of a worsening infection at this time which is good news. Overall I think that we are headed in the right direction although still I think he needs more aggressive offloading. 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 I do believe this would be ideal for him to be honest. Readmission: 01-15-2022 upon evaluation today patient presents for reevaluation he was last seen on October 26, 2021. At that time unfortunately he was having issues with a significant ischial pressure ulcer on the right ischial tuberosity. Subsequently he ended up having decent response to the Dakin's moistened gauze dressing that was previously being utilized and subsequently the patient's sister who is his primary caregiver as well states that she decided just to try to manage this at home. Nonetheless unfortunately this is getting a little larger not better. He does spend a lot of the day in his chair.  He does have an air mattress according what they tell me today. That was previously ordered. With that being said I do believe that at this time things do appear to be doing much better from the standpoint of infection I do not see any signs of overt  infection. There is also no evidence of systemic infection. No fevers, chills, nausea, vomiting, or diarrhea. With that being said I do believe based on what I am seeing the wound is a bit cleaner and he possibly could be an excellent candidate for a wound VAC. 02-05-2022 upon evaluation today patient appears to be doing about the same in regard to his wound. Unfortunately he is not any better but he also really has not had the wound VAC on sufficiently. There is been some confusion with the orders part of that I think is our follow-up with the way the order was written part of it may be with how the wound VAC is being placed either way we need to see what we can do to try to improve things in this regard. We will clarify the orders today going forward for home health. Unfortunately the patient does have a new wound today which is on the opposite side. This is not good 1 was bad enough have another area to open is definitely not a good thing. Its not as deep but nonetheless is also not very shallow either. We may potentially be able to get this to the point that we could bridge the 2 together in order to wound VAC both but right now want a focus on to try to get the wound VAC going for the main wound initially we will likely use Hydrofera Blue on the new wound.[name] 02-19-2022 upon evaluation today patient's wound appears to be doing a little bit worse in the way of maceration fortunately there is no signs of active infection locally or systemically which is great news. No fevers, chills, nausea, vomiting, or diarrhea. [name], [name] ([id number]) [id number] Page 10 of 14 03-05-2022 upon evaluation today patient unfortunately does  not appear to be doing nearly as good in regard to his wounds as I would like to see. Fortunately I do not see any evidence of active infection that is obvious although I am can obtain a wound culture to ensure that there is not anything getting worse here as far as the wound without the wound VAC on his concern. With that being said I will go ahead and get this sent in and evaluated will initiate treatment as needed going forward if necessary. With regard to the wound with the wound VAC this continues to be placed directly over the wound and there is obvious signs of deep tissue injury. The suction being here means that he is sitting on it I think this may be part of the reason why it is coming off although not 100% certain. 03-12-2022 upon evaluation today patient still has not gotten the antibiotics that were called in for him 4 days ago. He tells me that his sister just told him this morning that they were ready but that she has not had a chance to go pick them up. Nonetheless I feel like that this is really something he needs any should have been taken that not the other antibiotics which were not doing the job for him to be honest. The patient states and I completely agree that there is really no way he would have known relying on his sister to let him know obviously. Nonetheless I do believe that he needs to not be taking any other antibiotics and be on the Levaquin as soon as possible. He tells me that she said she was going to pick them up today. In regard to home health I am hopeful that they actually  have been bridging this now. I do think that the wound looks better and this is good news. With that being said I am not certain that I can really tell for sure how this was attached it was removed before he came in and the black foam left in place. I really want him to leave the wound VAC in place until he comes in so that we can monitor and make sure that this is being done correctly he voiced  understanding. 03-19-2022 upon evaluation today and much happier with how things appear as far as the patient's wounds are concerned. I think he is on a much better track and they are doing a better job at bridging although its not quite where I like to see it is still more posterior which I think the patient needs to be a little bit more lateral on his side. Either way this is something that I did marked with a small dot today for him to tell the home health nurses well so she can bridge it to that location also think the tubing should go up instead of going down to just make it less cumbersome overall for him. 04-19-2022 upon evaluation today patient appears to be doing a little better in regard to his wounds. Fortunately I do not see any signs of infection I do believe he is on the right track. The wound VAC does seem to be helping to clean the wound up a bit and bring it in from the base at work. This is good news. 05-10-2022 upon evaluation today patient appears to be doing well currently in regard to his wounds in general in fact of the smaller of the 2 wounds without the wound VAC actually appears to be doing so much better this is almost completely closed and very pleased in that regard. He has been staying off of this he tells me and shows. With that being said the wound with the wound VAC is going require some sharp debridement there is some tissue which is not quite as healthy and we are going to go ahead and continue that for probably 2 more weeks although I think we may be closer to discontinuing this as well since it has healed and quite significantly. 05-24-2022 upon evaluation today patient appears to be doing well with regard to his wound in fact this looks better than I have been expected. This actually I think is a good time to go ahead and switch away from the wound VAC based on what I am seeing. I think we go to Hydrofera Blue with a bordered foam dressing. 06-21-2022 upon evaluation  today patient appears to be doing well currently in regard to his wound this is measuring smaller and looks well. Fortunately there does not appear to be any signs of active infection locally or systemically at this time which is great news. No fevers, chills, nausea, vomiting, or diarrhea. 07-29-2022 upon evaluation today patient appears to be doing well currently in regard to his wound. This is actually measuring smaller although there is a lot of hypergranulation noted. Fortunately there does not appear to be any signs of active infection locally or systemically at this time. No fevers, chills, nausea, vomiting, or diarrhea. 11/9; pressure ulcer on the right buttock in the setting of spina bifida and lower extremity paralysis. He has been using Hydrofera Blue 09-02-2022 upon evaluation today patient appears to be doing well currently in regard to his wound. We have been using Hydrofera Blue  this is showing signs of improvement as far as getting smaller is concerned. There is some drainage around the edges of the wound but I think a lot of this is due to the fact he did not even have a dressing on that he came in today. 12/29; right buttock pressure area which was initially stage IV. This is in the setting of a patient with spina bifida spends most of his time in a wheelchair. I am not certain how much he is offloading this. He has been using for Hydrofera Blue as a primary dressing. When he was here the last visit he was felt to have hyper granulated tissue and was treated with silver nitrate chemical cauterization 10-14-2022 upon evaluation today patient appears to be doing poorly in regard to the wound in the right ischial location. Unfortunately he tells me that the dressing seems to be falling off frequently. Unfortunately I think that this has contributed to him developing an infection which appears to be exemplified by the fact that he has bone exposed which is actually necrotic and working its  way out of the wound up and have to perform a fairly significant debridement today it does appear. I think that he needs to have these dressings ensure that they are in place at all times. Also discussed with the patient that we will get a need to do some testing in order to see if he indeed has an infection. If he does have a bone infection which I think is pretty likely to be osteomyelitis of this ischial location. I discussed that with the patient in detail today and depending on what we see him and make any adjustments in care as far as going forward is concerned. Obviously depressed antibiotic that we gave him that he tells me was somewhat irritating as far as the stomach side effects are concerned. He states he really would not want to be on that again. Either way I feel he is probably going to be placed on something once we get the results of the culture back. I am honestly concerned about the fact that he comes into the clinic without any dressings in place I am not sure how often they are really staying where they need to be as far as this wound is concerned. I actually cannot remember seeing him anytime in the past 2 months or so at least that he came in with a dressing intact. 10-21-2022 upon evaluation today patient presents for initial inspection here in our clinic concerning issues that he has been having with a wound in the sacral area. Again last time he was here I did debride away necrotic bone which was confirmed on pathology although it was not necessarily confirmed that this was secondary to osteomyelitis on the pathology report. Also the x-ray did not neither confirm nor deny the presence of osteomyelitis and his culture unfortunately showed multiple organisms with nothing predominance of this does not help to rule and tailor down exactly what is going on here. Fortunately I do not see any signs of active infection systemically which is great news. With that being said locally I am  definitely seeing some issues here. In fact this is warm to touch around the sacral area and I think that cellulitis is definitely present I just cannot pinpoint the exact organism. With that being said I did discuss with the patient as well today that I do believe that he probably needs an MRI in order to further quantify the  extent of her likely osteomyelitis and this again is something that we are going to order this will need to be without contrast due to the fact that he is allergic to contrast dye. 11-18-2022 upon evaluation today patient's wound is actually showing some signs of improvement currently. We have been doing the Dakin's moistened gauze dressing which I think is helping to clean up the wound for seeing some granulation growing and still the tissue is not quite as healthy as I would love to see but better than previous. He does have osteomyelitis but the good news is he seems to be treated well with the antibiotics he does have a refill that was just picked up and I think that he needs to continue to take that for certain. 3/7; patient is on a 6-week clinic hiatus. He lives at home with his sister however she is not at home all the time. He transfers out of bed and a sliding board. He claims he is only in the in the wheelchair about 4 5 hours. He does have underlying osteomyelitis I am not sure about the status of the antibiotics here. Part of the wound still easily probes to bone 12-23-2022 upon evaluation today patient appears to be doing well currently in regard to his wound all things considered there is still a lot of hypergranulation but I do feel like that with the Vashe moistened gauze is actually doing better than he was previously with the Hydrofera Blue. This just did not seem to be staying in place and with the Vashe through able to change this more frequently. Readmission: 03-31-2023 this is a gentleman whom I have seen multiple times intermittently throughout the years  compliance has been a real issue however. I do feel like there is some significant issues here still with the fact that dressing changes and keeping dressings in place are still pretty much as a significant problem here for us . I do not see any signs of active infection at this time which is great news but at the same time the wounds do not worse in fact he has another wound different than what I even noted previous. Subsequently he I do believe that the patient should continue to do monitor for any evidence of infection or worsening. Overall I think he needs to have regular follow-up visits with us  and I think that he needs to have dressings in place at all times. He tells me that he is at home primarily by himself transferring himself from the first thing in the morning through at least 3 in the afternoon as his sister works he really needs [place], [place] ([id number]) [id number] Page 11 of 14 somebody gets a caregiver with him throughout the day. 04-12-2023 upon evaluation today patient appears to be doing a little worse currently in regard to his wounds. He has been tolerating the dressing changes without complication. Fortunately I do not see any evidence of active infection locally nor systemically at this point. 04-21-2023 upon evaluation today patient's wound unfortunately appears to have purulent drainage and also there is a malodorous drainage at this point. Unfortunately I think that there is something going on here that does not seem to be doing nearly as good as what we would like to see. I think that he is likely going to require some antibiotics. He has taken Cipro  before without complication. 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 On 7/18 he had purulent drainage  he had a PCR culture and is now on Cipro  he is tolerating this well. Using Hydrofera Blue's a 2 bit and border foam his sister is changing the dressing After  his sister goes to work the patient is apparently in a wheelchair all day. There is no way to heal the wound like this with that degree of prolonged pressure. 05-12-2023 upon evaluation today patient unfortunately appears to be doing much worse than what we previously seen. Last time he was here he was seen by Dr. Rufus. This was shortly after I put him on Cipro  he was tolerating that well. Nonetheless he did not show up last week apparently there was a transportation issue. This week upon arrival this wound appears to be doing significantly worse there is a significant malodorous drainage and this is deeper he has erythema in the gluteal region around it appears to be infected and to be honest despite the Cipro  and oral antibiotics he seems to be doing worse not better. 05-19-2023 upon evaluation patient appears to be doing better than last time I saw him with regard to his wound in the right gluteal location. Fortunately there does not appear to be any signs of systemic infection though he does have evidence of osteomyelitis noted which were acute on chronic and that was the findings that were noted in the hospital when he was admitted most recently after I sent him from May 12, 2023 through May 17, 2023. The patient was treated with IV Zosyn  and bank while in the hospital and discharged on Augmentin  although that has not been picked up yet it has been 2 days since he was discharged he states that the pharmacy never called his sister to let her know it was ready. Nonetheless this needs to be gotten ASAP to make sure that he is fully treated as far as the infection is concerned I discussed that with him today as well. 05-26-2023 upon evaluation today patient appears to be doing better today with regard to his wounds compared to what we have noticed previous. Fortunately I do not see any evidence of active infection locally or systemically which is great news and in general I do believe that we are  making headway towards complete closure. With that being said we still had some trouble getting the order done for the trapeze and aids to help him with regard to his daily activities and preventing the dressings from coming off. We have reached out to his CAP Worker and have not gotten in touch with her as of yet. I did recommend that the patient try to see. He or his sister to get in touch with Angelique and see if they can help us  in that regard. 8/29; patient with 2 wounds in close juxtaposition on the right buttock. Both of these probes very close to the bone especially the area over the ischial tuberosity. Patient was recently in the hospital for additional antibiotics I did not look at this today. We are using Dakin's wet to dry. His sister changes in dressings. The wounds are measuring larger. 06-16-2023 upon evaluation today patient appears to be doing worse currently at this time. Fortunately I do not see any signs of infection which is good news but at the same time he does have deep tissue injury which has me concerned about potential further breakdown here. He does tell me that he has been up on it a little bit more. 07-12-2023 upon evaluation today patient appears to be doing okay  in regard to his wound there is actually an area that is merged between the 2 and therefore this is really just 1 wound we converted it as such. There is no separation between the 2. I knew that this was going to happen it was just a matter of time. With that being said I do not see any signs of infection obvious at this point but I do believe that this is still an issue with him keeping the dressing in place he tells me it is not staying in place which does have me concerned as well. Fortunately I do not see any evidence of worsening overall systemically though locally I am a little concerned about how the wounds have digressed since I last saw him. 07-28-2023 upon evaluation today patient unfortunately appears  to be doing worse with a new wound at this point on the left side that was not there last time I saw him. He has had missed visits in the past 4 visits he has missed 2 of those unfortunately. This again was a condition about seeing him back that he had to make sure that he showed up for all visits. That is not happening at the moment and I have discussion with him today about that. I discussed that if he is not going to show up for his visits that we are going to have to discharge him that was the original agreement and that is still where things stand at this point to be honest. The patient voiced understanding. I told him that we are going to give him 1 more shot today before we would proceed to anything like that but again if he is not coming to the appointments as directed that there is really no point and has continued to have them on the books as far as trying to get this healed is concerned. He voiced understanding. He still tells me that his dressings were also not staying in place in fact he tells me that every time he gets out of the bed the dressing is already off. He came in today with no dressing on. Noncompliance has been a significant issue through the course of the treatment with this gentleman and still continues to be so. 08-04-2023 upon evaluation today patient's wound actually appears to be doing better than it has for quite some time. I am actually seeing some improvement which is good news and in general I do believe that we will make an some headway here. I do not see any signs of obvious infection there is also no need for obvious debridement today. Overall I think that we are on the right track. He did actually have a dressing in place upon arrival today. 08-15-2023 upon evaluation today patient appears to be doing well currently in regard to his wound. He actually looks much better he has been in the hospital since I last saw him. He tells me that his sister got concerned about  the appearance of his wound he was there where they did aggressive offloading and put him on IV antibiotics as well he seems to be doing much better. 08-29-2023 upon evaluation today the patient unfortunately presents without a dressing in place. When I inquired as to when the dressing came off his response was well I am really not sure what is today. When I questioned further about this he told me that I guess it probably came off sometime yesterday. Subsequently I am very frustrated with the fact that the patient  really does not seem to be keeping these dressings in place have had multiple conversations with him many different times about how this needed to stay in place how not have been in place runs the risk of infection as well as getting other material into the wound as well as not allowing the wound to even heal appropriately. All of which are bad things. Again this has been an ongoing issue with noncompliance from his last admission to even this 1 I have given him a lot of grace and leeway on a lot of things including missed appointments for various reasons which were not supposed to happen when he came back. Nonetheless I told him today unequivocally that if he shows up again with a dressing not in place that I will initiate discharge as of that date he will have 30 days to find a new provider. I explained this is not punitive but simply the fact that I cannot work with him to heal the wound contractual he if he is not going to keep the dressings in place and ensure that it is replaced whenever needed. 09-12-2023 upon evaluation today patient appears to be doing worse in regard to his wound. Unfortunately he has been still having issues with keeping his dressings in place. I think he is can have to have this done 2 times a day going forward. It is going to have to be diligently treated in order to make sure this has any chance of healing he has obvious signs of pressure injury today which  is unfortunate this is worse than last time I saw him. With that being said he also is not keeping dressings in place which is a significant issue here. 09-22-2023 upon evaluation today patient appears to be doing a little better in regard to his wound. I am actually going to perform some debridement clearway necrotic debris he is in agreement with the plan we will get a try to see about getting Hydrofera Blue he is doing well with this in the past and in fact we were able to get one of his wounds completely healed with use of the Hydrofera Blue. This has been in the past. 10-12-23 upon evaluation today patient's wound is showing signs of good improvement. I feel like he is slowly making progress here which is good news. Fortunately I do not see any evidence of worsening overall and I believe that the patient is making better headway here towards closure compared to where he has been previous. [place], [name] ([id number]) [id number] Page 12 of 14 Objective Constitutional Well-nourished and well-hydrated in no acute distress. Vitals Time Taken: 2:07 PM, Weight: 90 lbs, Temperature: 97.6 F, Pulse: 102 bpm, Respiratory Rate: 18 breaths/min, Blood Pressure: 123/72 mmHg. Respiratory normal breathing without difficulty. Psychiatric this patient is able to make decisions and demonstrates good insight into disease process. Alert and Oriented x 3. pleasant and cooperative. General Notes: Upon inspection patient's wound bed actually showed signs of good granulation and epithelization at this point. Fortunately I do not see any signs of infection locally or systemically which is great news and in general I do believe that were making headway here towards getting this wound closed I think that he still has some issues with pressure he tells me sometimes it is kind of dark and discolored which I think that is what is happening at that point. With that being said right now I do not  see any signs of discoloration that would indicate deep tissue injury  which is good news. Integumentary (Hair, Skin) Wound #5 status is Open. Original cause of wound was Gradually Appeared. The date acquired was: 10/04/2021. The wound has been in treatment 27 weeks. The wound is located on the Right,Midline Gluteus. The wound measures 4.5cm length x 8.7cm width x 1.6cm depth; 30.748cm^2 area and 49.197cm^3 volume. There is bone, muscle, and Fat Layer (Subcutaneous Tissue) exposed. There is a large amount of serosanguineous drainage noted. There is medium (34-66%) red, pink granulation within the wound bed. There is a medium (34-66%) amount of necrotic tissue within the wound bed. Assessment Active Problems ICD-10 Other chronic osteomyelitis, other site Pressure ulcer of right buttock, stage 4 Spina bifida occulta Colostomy status Essential (primary) hypertension Atherosclerotic heart disease of native coronary artery without angina pectoris Procedures Wound #5 Pre-procedure diagnosis of Wound #5 is a Pressure Ulcer located on the Right,Midline Gluteus . There was a Excisional Skin/Subcutaneous Tissue Debridement with a total area of 30.73 sq cm performed by Bethena Ferraris, PA-C. With the following instrument(s): Curette to remove Viable and Non-Viable tissue/material. Material removed includes Subcutaneous Tissue, Slough, and Biofilm. A time out was conducted at 14:38, prior to the start of the procedure. A Moderate amount of bleeding was controlled with Pressure. The procedure was tolerated well. Post Debridement Measurements: 4.5cm length x 8.7cm width x 1.6cm depth; 49.197cm^3 volume. Post debridement Stage noted as Category/Stage IV. Character of Wound/Ulcer Post Debridement is stable. Post procedure Diagnosis Wound #5: Same as Pre-Procedure Plan Follow-up Appointments: Return Appointment in 1 week. Bathing/ Shower/ Hygiene: Wash wounds with antibacterial soap and water. Anesthetic (Use  'Patient Medications' Section for Anesthetic Order Entry): Lidocaine  applied to wound bed Off-Loading: Gel wheelchair cushion Hospital bed/mattress - ETHOS Low air-loss mattress (Group 2) - HOSPITAL BED WITH GROUPE 2 AIR MATTRESS AND TRAPEZE BAR [facility]OS Turn and reposition every 2 hours Additional Orders / Instructions: Other: - apply calmoseptine or AandD ointment to left glut area daily for protection change dressing every morning and every evening WOUND #5: - Gluteus Wound Laterality: Right, Midline Cleanser: [place] Ancillary Kit - 15 Day Supply (Generic) 1 x Per Day/30 Days Discharge Instructions: Use supplies as instructed; Kit contains: (15) Saline Bullets; (15) 3x3 Gauze; 15 pr Gloves Prim Dressing: Hydrofera Blue Ready Transfer Foam, 4x5 (in/in) (Dispense As Written) 1 x Per Day/30 Days ary Discharge Instructions: Apply Hydrofera Blue Ready to wound bed as directed Secondary Dressing: ABD Pad 5x9 (in/in) (Generic) 1 x Per Day/30 Days Discharge Instructions: Cover with ABD pad [name] ([id number]) [id number] Page 13 of 14 Secured With: Medipore T - 58M Medipore H Soft Cloth Surgical T ape ape, 2x2 (in/yd) (Dispense As Written) 1 x Per Day/30 Days 1. I am going to recommend that we have the patient going continue with the Hydrofera Blue I think that still probably the best way to go. 2. Also can recommend we continue with ABD pad to cover followed by tape to secure in place. 3. I am also can recommend that we look into the power wheelchair this is something that we are trying to do if at all possible for him. He has not heard from [facility] yet we will get a double check that they received the referral and see what is going on in that regard. We will see patient back for reevaluation in 1 week here in the clinic. If anything worsens or changes patient will contact our office for additional recommendations. Electronic Signature(s) Signed: 10/12/2023  2:59:06 PM By: Bethena Ferraris PA-C Previous  Signature: 10/12/2023 2:51:53 PM Version By: Bethena Ferraris PA-C Entered By: Bethena Ferraris on 10/12/2023 14:59:06 -------------------------------------------------------------------------------- SuperBill Details Patient Name: Date of Service: FA IRCLO TH, [name] 10/12/2023 Medical Record Number: [id number] Patient Account Number: [id number] Date of Birth/Sex: Treating RN: 1965-10-22 (57 y.o. [name]) Alverta Sailors Primary Care Provider: Auston Purchase Other Clinician: Purcell Sniff Referring Provider: Treating Provider/Extender: Bethena Ferraris Auston Purchase Devra in Treatment: 27 Diagnosis Coding ICD-10 Codes Code Description [phone] Other chronic osteomyelitis, other site L89.314 Pressure ulcer of right buttock, stage 4 Q76.0 Spina bifida occulta Z93.3 Colostomy status I10 Essential (primary) hypertension I25.10 Atherosclerotic heart disease of native coronary artery without angina pectoris Facility Procedures : CPT4 Code: [id number] Description: 11042 - DEB SUBQ TISSUE 20 SQ CM/< ICD-10 Diagnosis Description L89.314 Pressure ulcer of right buttock, stage 4 Modifier: Quantity: 1 : CPT4 Code: [id number] Description: 11045 - DEB SUBQ TISS EA ADDL 20CM ICD-10 Diagnosis Description L89.314 Pressure ulcer of right buttock, stage 4 Modifier: Quantity: 1 Physician Procedures : CPT4 Code Description Modifier 11042 11042 - WC PHYS SUBQ TISS 20 SQ CM ICD-10 Diagnosis Description L89.314 Pressure ulcer of right buttock, stage 4 Quantity: 1 : [id number] 11045 - WC PHYS SUBQ TISS EA ADDL 20 CM ICD-10 Diagnosis Description L89.314 Pressure ulcer of right buttock, stage 4 [place], [name] ([id number]) [id number] Page 14 of 1 Quantity: 1 4 Electronic Signature(s) Signed: 10/12/2023 2:59:16 PM By: Bethena Ferraris PA-C Entered By: Bethena Ferraris on 10/12/2023 14:59:16

## 2023-10-13 NOTE — Progress Notes (Signed)
 [name], Chase ([id number]) [id number] Page 1 of 9 Visit Report for 10/12/2023 Arrival Information Details Patient Name: Date of Service: [name] [facility], DO [place] LD 10/12/2023 2:00 PM Medical Record Number: [id number] Patient Account Number: [id number] Date of Birth/Sex: Treating RN: April 16, 1966 (58 y.o. Chase) Alverta [name] Primary Care Mechel Schutter: Auston [place] Other Clinician: Purcell Sniff Referring Raney Koeppen: Treating Nicloe Frontera/Extender: Bethena Andre [place] Devra in Treatment: 27 Visit Information History Since Last Visit All ordered tests and consults were completed: No Patient Arrived: Wheel Chair Added or deleted any medications: No Arrival Time: 14:01 Any new allergies or adverse reactions: No Transfer Assistance: Transfer Board Had a fall or experienced change in No Patient Identification Verified: Yes activities of daily living that may affect Secondary Verification Process Completed: Yes risk of falls: Patient Requires Transmission-Based Precautions: No Signs or symptoms of abuse/neglect since last visito No Patient Has Alerts: No Hospitalized since last visit: No Implantable device outside of the clinic excluding No cellular tissue based products placed in the center since last visit: Has Dressing in Place as Prescribed: Yes Pain Present Now: No Electronic Signature(s) Signed: 10/12/2023 5:49:46 PM By: Purcell Sniff Entered By: Purcell Sniff on 10/12/2023 14:07:38 -------------------------------------------------------------------------------- Clinic Level of Care Assessment Details Patient Name: Date of ServiceBETHA [name] [facility], [name] 10/12/2023 2:00 PM Medical Record Number: [id number] Patient Account Number: [id number] Date of Birth/Sex: Treating RN: 29-Aug-1966 (58 y.o. NETTY Alverta [name] Primary Care Taaj Hurlbut: Auston [place] Other Clinician: Purcell Sniff Referring [name]: Treating Kruze Atchley/Extender: Bethena Andre [place] Devra  in Treatment: 27 Clinic Level of Care Assessment Items TOOL 1 Quantity Score []  - 0 Use when EandM and Procedure is performed on INITIAL visit ASSESSMENTS - Nursing Assessment / Reassessment []  - 0 General Physical Exam (combine w/ comprehensive assessment (listed just below) when performed on new pt. evals) []  - 0 Comprehensive Assessment (HX, ROS, Risk Assessments, Wounds Hx, etc.) MARIKAY Chase ([id number]) [id number] Page 2 of 9 ASSESSMENTS - Wound and Skin Assessment / Reassessment []  - 0 Dermatologic / Skin Assessment (not related to wound area) ASSESSMENTS - Ostomy and/or Continence Assessment and Care []  - 0 Incontinence Assessment and Management []  - 0 Ostomy Care Assessment and Management (repouching, etc.) PROCESS - Coordination of Care []  - 0 Simple Patient / Family Education for ongoing care []  - 0 Complex (extensive) Patient / Family Education for ongoing care []  - 0 Staff obtains Chiropractor, Records, T Results / Process Orders est []  - 0 Staff telephones HHA, Nursing Homes / Clarify orders / etc []  - 0 Routine Transfer to another Facility (non-emergent condition) []  - 0 Routine Hospital Admission (non-emergent condition) []  - 0 New Admissions / Manufacturing Engineer / Ordering NPWT Apligraf, etc. , []  - 0 Emergency Hospital Admission (emergent condition) PROCESS - Special Needs []  - 0 Pediatric / Minor Patient Management []  - 0 Isolation Patient Management []  - 0 Hearing / Language / Visual special needs []  - 0 Assessment of Community assistance (transportation, D/C planning, etc.) []  - 0 Additional assistance / Altered mentation []  - 0 Support Surface(s) Assessment (bed, cushion, seat, etc.) INTERVENTIONS - Miscellaneous []  - 0 External ear exam []  - 0 Patient Transfer (multiple staff / Nurse, Adult / Similar devices) []  - 0 Simple Staple / Suture removal (25 or less) []  - 0 Complex Staple / Suture removal (26 or  more) []  - 0 Hypo/Hyperglycemic Management (do not check if billed separately) []  - 0 Ankle / Brachial Index (ABI) - do not check if billed separately  Has the patient been seen at the hospital within the last three years: Yes Total Score: 0 Level Of Care: ____ Electronic Signature(s) Signed: 10/12/2023 5:49:46 PM By: Purcell Sniff Entered By: Purcell Sniff on 10/12/2023 14:41:43 -------------------------------------------------------------------------------- Encounter Discharge Information Details Patient Name: Date of Service: [name] TH, [name] 10/12/2023 2:00 PM Medical Record Number: [id number] Patient Account Number: [id number] Date of Birth/Sex: Treating RN: 08/09/1966 (58 y.o. NETTY Alverta [name] Primary Care Avana Kreiser: Auston [place] Other Clinician: Purcell Sniff Referring [name]: Treating Add Dinapoli/Extender: Bethena Andre [place]on [place], [place] ([id number]) [id number] Page 3 of 9 Weeks in Treatment: 27 Encounter Discharge Information Items Post Procedure Vitals Discharge Condition: Stable Temperature (F): 97.6 Ambulatory Status: Wheelchair Pulse (bpm): 102 Discharge Destination: Home Respiratory Rate (breaths/min): 18 Transportation: Other Blood Pressure (mmHg): 123/72 Accompanied By: self Schedule Follow-up Appointment: Yes Clinical Summary of Care: Electronic Signature(s) Signed: 10/12/2023 5:49:46 PM By: Purcell Sniff Entered By: Purcell Sniff on 10/12/2023 14:59:29 -------------------------------------------------------------------------------- Lower Extremity Assessment Details Patient Name: Date of ServiceBETHA [name] [facility], [name] 10/12/2023 2:00 PM Medical Record Number: [id number] Patient Account Number: [id number] Date of Birth/Sex: Treating RN: December 11, 1965 (58 y.o. Chase) Alverta [name] Primary Care Reid Nawrot: Auston Purchase Other Clinician: Purcell Sniff Referring [name]: Treating Farris Blash/Extender: Bethena Andre [place] Weeks in Treatment: 27 Electronic Signature(s) Signed: 10/12/2023 3:46:47 PM By: Alverta Sailors RN Signed: 10/12/2023 5:49:46 PM By: Purcell Sniff Entered By: Purcell Sniff on 10/12/2023 14:18:39 -------------------------------------------------------------------------------- Multi Wound Chart Details Patient Name: Date of Service: [name] TH, [name] 10/12/2023 2:00 PM Medical Record Number: [id number] Patient Account Number: [id number] Date of Birth/Sex: Treating RN: 1966-08-19 (57 y.o. NETTY Alverta [name] Primary Care Asuncion Shibata: Auston Purchase Other Clinician: Purcell Sniff Referring Ginevra Tacker: Treating Tyan Dy/Extender: Bethena Andre [place] Devra in Treatment: 27 Vital Signs Height(in): Pulse(bpm): 102 Weight(lbs): 90 Blood Pressure(mmHg): 123/72 Body Mass Index(BMI): Temperature(F): 97.6 Respiratory Rate(breaths/min): 18 [Faddis, Rein ([id number]):Photos:] [phone].pdf Page 4 of 9:5 N/A N/A N/A N/A] Right, Midline Gluteus N/A N/A Wound Location: Gradually Appeared N/A N/A Wounding Event: Pressure Ulcer N/A N/A Primary Etiology: Cataracts, Glaucoma, Coronary Artery N/A N/A Comorbid History: Disease, Hypertension, History of pressure wounds, Osteoarthritis, Paraplegia 10/04/2021 N/A N/A Date Acquired: 23 N/A N/A Weeks of Treatment: Open N/A N/A Wound Status: No N/A N/A Wound Recurrence: 4.5x8.7x1.6 N/A N/A Measurements L x W x D (cm) 30.748 N/A N/A A (cm) : rea 49.197 N/A N/A Volume (cm) : -213.20% N/A N/A % Reduction in A rea: -234.10% N/A N/A % Reduction in Volume: Category/Stage IV N/A N/A Classification: Large N/A N/A Exudate A mount: Serosanguineous N/A N/A Exudate Type: red, brown N/A N/A Exudate Color: Medium (34-66%) N/A N/A Granulation A mount: Red, Pink N/A N/A Granulation Quality: Medium (34-66%) N/A N/A Necrotic A mount: Fat Layer (Subcutaneous Tissue): Yes N/A N/A Exposed Structures: Muscle:  Yes Bone: Yes Fascia: No Tendon: No Joint: No None N/A N/A Epithelialization: Debridement - Excisional N/A N/A Debridement: Pre-procedure Verification/Time Out 14:38 N/A N/A Taken: Subcutaneous, Slough N/A N/A Tissue Debrided: Skin/Subcutaneous Tissue N/A N/A Level: 30.73 N/A N/A Debridement A (sq cm): rea Curette N/A N/A Instrument: Moderate N/A N/A Bleeding: Pressure N/A N/A Hemostasis A chieved: Procedure was tolerated well N/A N/A Debridement Treatment Response: 4.5x8.7x1.6 N/A N/A Post Debridement Measurements L x W x D (cm) 49.197 N/A N/A Post Debridement Volume: (cm) Category/Stage IV N/A N/A Post Debridement Stage: Debridement N/A N/A Procedures Performed: Treatment Notes Wound #5 (Gluteus) Wound Laterality: Right, Midline Cleanser Byram Ancillary Kit - 15 Day Supply Discharge  Instruction: Use supplies as instructed; Kit contains: (15) Saline Bullets; (15) 3x3 Gauze; 15 pr Gloves Peri-Wound Care Topical Primary Dressing Hydrofera Blue Ready Transfer Foam, 4x5 (in/in) Discharge Instruction: Apply Hydrofera Blue Ready to wound bed as directed Secondary Dressing ABD Pad 5x9 (in/in) Discharge Instruction: Cover with ABD pad Secured With Medipore T - 59M Medipore H Soft Cloth Surgical T ape ape, 2x2 (in/yd) Compression Wrap Compression Stockings [place] ([id number]) [id number] Page 5 of 9 Add-Ons Electronic Signature(s) Signed: 10/12/2023 5:49:46 PM By: Purcell Sniff Entered By: Purcell Sniff on 10/12/2023 17:46:21 -------------------------------------------------------------------------------- Multi-Disciplinary Care Plan Details Patient Name: Date of Service: [name] TH, [name] 10/12/2023 2:00 PM Medical Record Number: [id number] Patient Account Number: [id number] Date of Birth/Sex: Treating RN: 01-24-1966 (57 y.o. Chase) Alverta [name] Primary Care Skyy Mcknight: Auston [place] Other Clinician: Purcell Sniff Referring  Sanjuana Mruk: Treating Windell Musson/Extender: Bethena Andre [place] Devra in Treatment: 27 Active Inactive Pressure Nursing Diagnoses: Potential for impaired tissue integrity related to pressure, friction, moisture, and shear Goals: Patient will remain free from development of additional pressure ulcers Date Initiated: 03/31/2023 Target Resolution Date: 07/01/2023 Goal Status: Active Interventions: Assess: immobility, friction, shearing, incontinence upon admission and as needed Assess offloading mechanisms upon admission and as needed Assess potential for pressure ulcer upon admission and as needed Notes: Wound/Skin Impairment Nursing Diagnoses: Knowledge deficit related to ulceration/compromised skin integrity Goals: Patient/caregiver will verbalize understanding of skin care regimen Date Initiated: 03/31/2023 Target Resolution Date: 07/01/2023 Goal Status: Active Ulcer/skin breakdown will have a volume reduction of 30% by week 4 Date Initiated: 03/31/2023 Date Inactivated: 06/02/2023 Target Resolution Date: 05/31/2023 Goal Status: Unmet Unmet Reason: comorbidities Ulcer/skin breakdown will have a volume reduction of 50% by week 8 Date Initiated: 03/31/2023 Target Resolution Date: 07/01/2023 Goal Status: Active Ulcer/skin breakdown will have a volume reduction of 80% by week 12 Date Initiated: 03/31/2023 Target Resolution Date: 07/31/2023 Goal Status: Active Ulcer/skin breakdown will heal within 14 weeks Date Initiated: 03/31/2023 Target Resolution Date: 08/31/2023 Goal Status: Active Interventions: Assess patient/caregiver ability to obtain necessary supplies [place], [place] ([id number]) [phone].pdf Page 6 of 9 Assess patient/caregiver ability to perform ulcer/skin care regimen upon admission and as needed Assess ulceration(s) every visit Notes: Electronic Signature(s) Signed: 10/12/2023 3:46:47 PM By: Alverta Sailors RN Signed: 10/12/2023 5:49:46 PM By:  Purcell Sniff Entered By: Purcell Sniff on 10/12/2023 14:41:58 -------------------------------------------------------------------------------- Pain Assessment Details Patient Name: Date of Service: [name] TH, [name] 10/12/2023 2:00 PM Medical Record Number: [id number] Patient Account Number: [id number] Date of Birth/Sex: Treating RN: [birth year] (57 y.o. NETTY Alverta [name] Primary Care Granville Whitefield: Auston [place] Other Clinician: Purcell Sniff Referring Chelsa Stout: Treating Luca Burston/Extender: Bethena Andre [place] Devra in Treatment: 27 Active Problems Location of Pain Severity and Description of Pain Patient Has Paino No Site Locations Pain Management and Medication Current Pain Management: Electronic Signature(s) Signed: 10/12/2023 3:46:47 PM By: Alverta Sailors RN Signed: 10/12/2023 5:49:46 PM By: Purcell Sniff Entered By: Purcell Sniff on 10/12/2023 14:17:28 MARIKAY GOLAS ([id number]) [id number] Page 7 of 9 -------------------------------------------------------------------------------- Patient/Caregiver Education Details Patient Name: Date of Service: [name] [facility], [name] 1/8/2025andnbsp2:00 PM Medical Record Number: [id number] Patient Account Number: [id number] Date of Birth/Gender: Treating RN: May 07, 1966 (57 y.o. NETTY Alverta [name] Primary Care Physician: Auston Purchase Other Clinician: Purcell Sniff Referring Physician: Treating Physician/Extender: Bethena Andre [place] Devra in Treatment: 27 Education Assessment Education Provided To: Patient Education Topics Provided Wound/Skin Impairment: Handouts: Other: continue wound care as directed Methods: Explain/Verbal Responses: State content correctly Electronic Signature(s) Signed: 10/12/2023  5:49:46 PM By: Purcell Sniff Entered By: Purcell Sniff on 10/12/2023 14:42:16 -------------------------------------------------------------------------------- Wound Assessment  Details Patient Name: Date of ServiceBETHA [name] [facility], [name] 10/12/2023 2:00 PM Medical Record Number: [id number] Patient Account Number: [id number] Date of Birth/Sex: Treating RN: 1966-07-10 (57 y.o. Chase) Alverta [name] Primary Care Brandn Mcgath: Auston [place] Other Clinician: Purcell Sniff Referring Alba Perillo: Treating Estellar Cadena/Extender: Bethena Andre [place] Weeks in Treatment: 27 Wound Status Wound Number: 5 Primary Pressure Ulcer Etiology: Wound Location: Right, Midline Gluteus Wound Open Wounding Event: Gradually Appeared Status: Date Acquired: 10/04/2021 Comorbid Cataracts, Glaucoma, Coronary Artery Disease, Hypertension, Weeks Of Treatment: 27 History: History of pressure wounds, Osteoarthritis, Paraplegia Clustered Wound: No Photos [place], [place] ([id number]) [id number] Page 8 of 9 Wound Measurements Length: (cm) 4.5 Width: (cm) 8.7 Depth: (cm) 1.6 Area: (cm) 30.748 Volume: (cm) 49.197 % Reduction in Area: -213.2% % Reduction in Volume: -234.1% Epithelialization: None Wound Description Classification: Category/Stage IV Exudate Amount: Large Exudate Type: Serosanguineous Exudate Color: red, brown Foul Odor After Cleansing: No Slough/Fibrino Yes Wound Bed Granulation Amount: Medium (34-66%) Exposed Structure Granulation Quality: Red, Pink Fascia Exposed: No Necrotic Amount: Medium (34-66%) Fat Layer (Subcutaneous Tissue) Exposed: Yes Tendon Exposed: No Muscle Exposed: Yes Necrosis of Muscle: No Joint Exposed: No Bone Exposed: Yes Treatment Notes Wound #5 (Gluteus) Wound Laterality: Right, Midline Cleanser Byram Ancillary Kit - 15 Day Supply Discharge Instruction: Use supplies as instructed; Kit contains: (15) Saline Bullets; (15) 3x3 Gauze; 15 pr Gloves Peri-Wound Care Topical Primary Dressing Hydrofera Blue Ready Transfer Foam, 4x5 (in/in) Discharge Instruction: Apply Hydrofera Blue Ready to wound bed as directed Secondary  Dressing ABD Pad 5x9 (in/in) Discharge Instruction: Cover with ABD pad Secured With Medipore T - 36M Medipore H Soft Cloth Surgical T ape ape, 2x2 (in/yd) Compression Wrap Compression Stockings Add-Ons Electronic Signature(s) Signed: 10/12/2023 3:46:47 PM By: Alverta Sailors RN Signed: 10/12/2023 5:49:46 PM By: Purcell Sniff Entered By: Purcell Sniff on 10/12/2023 14:18:29 MARIKAY GOLAS ([id number]) [id number] Page 9 of 9 -------------------------------------------------------------------------------- Vitals Details Patient Name: Date of ServiceBETHA [name] [facility], [name] 10/12/2023 2:00 PM Medical Record Number: [id number] Patient Account Number: [id number] Date of Birth/Sex: Treating RN: 1965-10-17 (57 y.o. Chase) Alverta [name] Primary Care Shenaya Lebo: Auston [place] Other Clinician: Purcell Sniff Referring Kashae Carstens: Treating Jailene Cupit/Extender: Bethena Andre [place] Devra in Treatment: 27 Vital Signs Time Taken: 14:07 Temperature (F): 97.6 Weight (lbs): 90 Pulse (bpm): 102 Respiratory Rate (breaths/min): 18 Blood Pressure (mmHg): 123/72 Reference Range: 80 - 120 mg / dl Electronic Signature(s) Signed: 10/12/2023 5:49:46 PM By: Purcell Sniff Entered By: Purcell Sniff on 10/12/2023 14:10:06

## 2023-10-20 ENCOUNTER — Encounter: Payer: 59 | Admitting: Physician Assistant

## 2023-10-20 DIAGNOSIS — L89323 Pressure ulcer of left buttock, stage 3: Secondary | ICD-10-CM | POA: Diagnosis not present

## 2023-10-20 NOTE — Progress Notes (Addendum)
[name], Chase Hill ([id number]) [id number] Page 1 of 8 Visit Report for 10/20/2023 Arrival Information Details Patient Name: Date of Service: [name] [facility], DO [place] LD 10/20/2023 1:45 PM Medical Record Number: [id number] Patient Account Number: [id number] Date of Birth/Sex: Treating RN: [birth year] (58 y.o. [name] Primary Care Shyhiem Beeney: Aram Beecham Other Clinician: Betha Loa Referring Cayleb Jarnigan: Treating Mazell Aylesworth/Extender: Gabriel Earing in Treatment: 29 Visit Information History Since Last Visit All ordered tests and consults were completed: No Patient Arrived: Wheel Chair Added or deleted any medications: No Arrival Time: 13:58 Any new allergies or adverse reactions: No Transfer Assistance: Transfer Board Had a fall or experienced change in No Patient Identification Verified: Yes activities of daily living that may affect Secondary Verification Process Completed: Yes risk of falls: Patient Requires Transmission-Based Precautions: No Signs or symptoms of abuse/neglect since last visito No Patient Has Alerts: No Hospitalized since last visit: No Implantable device outside of the clinic excluding No cellular tissue based products placed in the center since last visit: Has Dressing in Place as Prescribed: Yes Pain Present Now: No Electronic Signature(s) Signed: 10/20/2023 4:38:07 PM By: Betha Loa Entered By: Betha Loa on 10/20/2023 14:05:19 -------------------------------------------------------------------------------- Clinic Level of Care Assessment Details Patient Name: Date of ServiceEulas [name] [facility], [name] 10/20/2023 1:45 PM Medical Record Number: [id number] Patient Account Number: [id number] Date of Birth/Sex: Treating RN: 1966/06/16 (58 y.o. [name] Primary Care Rhyan Wolters: Aram Beecham Other Clinician: Betha Loa Referring Timon Geissinger: Treating Halley Shepheard/Extender: Gabriel Earing in Treatment: 29 Clinic Level of Care Assessment Items TOOL 1 Quantity Score []  - 0 Use when EandM and Procedure is performed on INITIAL visit ASSESSMENTS - Nursing Assessment / Reassessment []  - 0 General Physical Exam (combine w/ comprehensive assessment (listed just below) when performed on new pt. evals) []  - 0 Comprehensive Assessment (HX, ROS, Risk Assessments, Wounds Hx, etc.) [name] ([id number]) [id number] Page 2 of 8 ASSESSMENTS - Wound and Skin Assessment / Reassessment []  - 0 Dermatologic / Skin Assessment (not related to wound area) ASSESSMENTS - Ostomy and/or Continence Assessment and Care []  - 0 Incontinence Assessment and Management []  - 0 Ostomy Care Assessment and Management (repouching, etc.) PROCESS - Coordination of Care []  - 0 Simple Patient / Family Education for ongoing care []  - 0 Complex (extensive) Patient / Family Education for ongoing care []  - 0 Staff obtains Chiropractor, Records, T Results / Process Orders est []  - 0 Staff telephones HHA, Nursing Homes / Clarify orders / etc []  - 0 Routine Transfer to another Facility (non-emergent condition) []  - 0 Routine Hospital Admission (non-emergent condition) []  - 0 New Admissions / Manufacturing engineer / Ordering NPWT Apligraf, etc. , []  - 0 Emergency Hospital Admission (emergent condition) PROCESS - Special Needs []  - 0 Pediatric / Minor Patient Management []  - 0 Isolation Patient Management []  - 0 Hearing / Language / Visual special needs []  - 0 Assessment of Community assistance (transportation, D/C planning, etc.) []  - 0 Additional assistance / Altered mentation []  - 0 Support Surface(s) Assessment (bed, cushion, seat, etc.) INTERVENTIONS - Miscellaneous []  - 0 External ear exam []  - 0 Patient Transfer (multiple staff / Nurse, adult / Similar devices) []  - 0 Simple Staple / Suture removal (25 or less) []  - 0 Complex Staple / Suture  removal (26 or more) []  - 0 Hypo/Hyperglycemic Management (do not check if billed separately) []  - 0 Ankle / Brachial Index (ABI) - do not check if billed separately  Has the patient been seen at the hospital within the last three years: Yes Total Score: 0 Level Of Care: ____ Electronic Signature(s) Signed: 10/20/2023 4:38:07 PM By: Betha Loa Entered By: Betha Loa on 10/20/2023 14:27:58 -------------------------------------------------------------------------------- Encounter Discharge Information Details Patient Name: Date of Service: [name] [facility], [name] 10/20/2023 1:45 PM Medical Record Number: [id number] Patient Account Number: [id number] Date of Birth/Sex: Treating RN: 1966-08-11 (58 y.o. [name] Primary Care Nikya Busler: Aram Beecham Other Clinician: Betha Loa Referring Takeria Marquina: Treating Rosangelica Pevehouse/Extender: Hermine Messick [place], [place] ([id number]) [id number] Page 3 of 8 Weeks in Treatment: 29 Encounter Discharge Information Items [name] Procedure Vitals Discharge Condition: Stable Temperature (F): 98.3 Ambulatory Status: Wheelchair Pulse (bpm): 96 Discharge Destination: Home Respiratory Rate (breaths/min): 18 Transportation: Other Blood Pressure (mmHg): 132/80 Accompanied By: self Schedule Follow-up Appointment: Yes Clinical Summary of Care: Electronic Signature(s) Signed: 10/20/2023 4:38:07 PM By: Betha Loa Entered By: Betha Loa on 10/20/2023 15:16:05 -------------------------------------------------------------------------------- Lower Extremity Assessment Details Patient Name: Date of ServiceEulas [name] [facility], [name] 10/20/2023 1:45 PM Medical Record Number: [id number] Patient Account Number: [id number] Date of Birth/Sex: Treating RN: 05/18/1966 (58 y.o. [name] Primary Care Aune Adami: Aram Beecham Other Clinician: Betha Loa Referring Malikah Principato: Treating Shanen Norris/Extender:  Hermine Messick Weeks in Treatment: 29 Electronic Signature(s) Signed: 10/20/2023 4:05:16 PM By: Angelina Pih Signed: 10/20/2023 4:38:07 PM By: Betha Loa Entered By: Betha Loa on 10/20/2023 14:17:22 -------------------------------------------------------------------------------- Multi Wound Chart Details Patient Name: Date of Service: [name] TH, [name] 10/20/2023 1:45 PM Medical Record Number: [id number] Patient Account Number: [id number] Date of Birth/Sex: Treating RN: [birth year] (58 y.o. [name] Primary Care Jamila Slatten: Aram Beecham Other Clinician: Betha Loa Referring Nyshawn Gowdy: Treating Ellianah Cordy/Extender: Hermine Messick Weeks in Treatment: 29 Vital Signs Height(in): Pulse(bpm): 96 Weight(lbs): 90 Blood Pressure(mmHg): 132/80 Body Mass Index(BMI): Temperature(F): 98.3 Respiratory Rate(breaths/min): 18 [Hammad, Barett ([id number]):Photos:] [id number].pdf Page 4 of 8:5 N/A N/A N/A N/A] Right, Midline Gluteus N/A N/A Wound Location: Gradually Appeared N/A N/A Wounding Event: Pressure Ulcer N/A N/A Primary Etiology: Cataracts, Glaucoma, Coronary Artery N/A N/A Comorbid History: Disease, Hypertension, History of pressure wounds, Osteoarthritis, Paraplegia 10/04/2021 N/A N/A Date Acquired: 42 N/A N/A Weeks of Treatment: Open N/A N/A Wound Status: No N/A N/A Wound Recurrence: 6.4x9x2 N/A N/A Measurements L x W x D (cm) 45.239 N/A N/A A (cm) : rea 90.478 N/A N/A Volume (cm) : -360.80% N/A N/A % Reduction in A rea: -514.40% N/A N/A % Reduction in Volume: Category/Stage IV N/A N/A Classification: Large N/A N/A Exudate A mount: Serosanguineous N/A N/A Exudate Type: red, brown N/A N/A Exudate Color: Medium (34-66%) N/A N/A Granulation A mount: Red, Pink N/A N/A Granulation Quality: Medium (34-66%) N/A N/A Necrotic A mount: Fat Layer (Subcutaneous Tissue): Yes N/A N/A Exposed  Structures: Muscle: Yes Bone: Yes Fascia: No Tendon: No Joint: No None N/A N/A Epithelialization: Treatment Notes Electronic Signature(s) Signed: 10/20/2023 4:38:07 PM By: Betha Loa Entered By: Betha Loa on 10/20/2023 14:17:26 -------------------------------------------------------------------------------- Multi-Disciplinary Care Plan Details Patient Name: Date of Service: [name] TH, [name] 10/20/2023 1:45 PM Medical Record Number: [id number] Patient Account Number: [id number] Date of Birth/Sex: Treating RN: [birth year] (58 y.o. [name] Primary Care Azzam Mehra: Aram Beecham Other Clinician: Betha Loa Referring Iseah Plouff: Treating Masiyah Engen/Extender: Hermine Messick Weeks in Treatment: 29 Active Inactive Pressure Nursing Diagnoses: TRAYSON, ZENK ([id number]) [id number] Page 5 of 8 Potential for impaired tissue integrity related to pressure, friction, moisture, and shear Goals: Patient will  remain free from development of additional pressure ulcers Date Initiated: 03/31/2023 Target Resolution Date: 07/01/2023 Goal Status: Active Interventions: Assess: immobility, friction, shearing, incontinence upon admission and as needed Assess offloading mechanisms upon admission and as needed Assess potential for pressure ulcer upon admission and as needed Notes: Wound/Skin Impairment Nursing Diagnoses: Knowledge deficit related to ulceration/compromised skin integrity Goals: Patient/caregiver will verbalize understanding of skin care regimen Date Initiated: 03/31/2023 Target Resolution Date: 07/01/2023 Goal Status: Active Ulcer/skin breakdown will have a volume reduction of 30% by week 4 Date Initiated: 03/31/2023 Date Inactivated: 06/02/2023 Target Resolution Date: 05/31/2023 Goal Status: Unmet Unmet Reason: comorbidities Ulcer/skin breakdown will have a volume reduction of 50% by week 8 Date Initiated:  03/31/2023 Target Resolution Date: 07/01/2023 Goal Status: Active Ulcer/skin breakdown will have a volume reduction of 80% by week 12 Date Initiated: 03/31/2023 Target Resolution Date: 07/31/2023 Goal Status: Active Ulcer/skin breakdown will heal within 14 weeks Date Initiated: 03/31/2023 Target Resolution Date: 08/31/2023 Goal Status: Active Interventions: Assess patient/caregiver ability to obtain necessary supplies Assess patient/caregiver ability to perform ulcer/skin care regimen upon admission and as needed Assess ulceration(s) every visit Notes: Electronic Signature(s) Signed: 10/20/2023 4:05:16 PM By: Angelina Pih Signed: 10/20/2023 4:38:07 PM By: Betha Loa Entered By: Betha Loa on 10/20/2023 14:42:05 -------------------------------------------------------------------------------- Pain Assessment Details Patient Name: Date of Service: [name] TH, [name] 10/20/2023 1:45 PM Medical Record Number: [id number] Patient Account Number: [id number] Date of Birth/Sex: Treating RN: 1965/12/23 (58 y.o. [name] Primary Care Pawel Soules: Aram Beecham Other Clinician: Betha Loa Referring Shenae Bonanno: Treating Analuisa Tudor/Extender: Hermine Messick Weeks in Treatment: 29 Active Problems Location of Pain Severity and Description of Pain Patient Has Paino No Site Locations [place], [place] ([id number]) [id number] Page 6 of 8 Pain Management and Medication Current Pain Management: Electronic Signature(s) Signed: 10/20/2023 4:05:16 PM By: Angelina Pih Signed: 10/20/2023 4:38:07 PM By: Betha Loa Entered By: Betha Loa on 10/20/2023 14:07:54 -------------------------------------------------------------------------------- Patient/Caregiver Education Details Patient Name: Date of Service: [name] TH, [name] 1/16/2025andnbsp1:45 PM Medical Record Number: [id number] Patient Account Number: [id number] Date of  Birth/Gender: Treating RN: 1966/04/29 (58 y.o. [name] Primary Care Physician: Aram Beecham Other Clinician: Betha Loa Referring Physician: Treating Physician/Extender: Gabriel Earing in Treatment: 29 Education Assessment Education Provided To: Patient Education Topics Provided Wound/Skin Impairment: Handouts: Other: continue wound care as directed Methods: Explain/Verbal Responses: State content correctly Electronic Signature(s) Signed: 10/20/2023 4:38:07 PM By: Betha Loa Entered By: Betha Loa on 10/20/2023 15:14:52 [name] ([id number]) [id number] Page 7 of 8 -------------------------------------------------------------------------------- Wound Assessment Details Patient Name: Date of ServiceEulas [name] [facility], [name] 10/20/2023 1:45 PM Medical Record Number: [id number] Patient Account Number: [id number] Date of Birth/Sex: Treating RN: 01-16-1966 (58 y.o. [name] Primary Care Kingjames Coury: Aram Beecham Other Clinician: Betha Loa Referring Kristine Tiley: Treating Ashaad Gaertner/Extender: Hermine Messick Weeks in Treatment: 29 Wound Status Wound Number: 5 Primary Pressure Ulcer Etiology: Wound Location: Right, Midline Gluteus Wound Open Wounding Event: Gradually Appeared Status: Date Acquired: 10/04/2021 Comorbid Cataracts, Glaucoma, Coronary Artery Disease, Hypertension, Weeks Of Treatment: 29 History: History of pressure wounds, Osteoarthritis, Paraplegia Clustered Wound: No Photos Wound Measurements Length: (cm) 6.4 Width: (cm) 9 Depth: (cm) 2 Area: (cm) 45.239 Volume: (cm) 90.478 % Reduction in Area: -360.8% % Reduction in Volume: -514.4% Epithelialization: None Wound Description Classification: Category/Stage IV Exudate Amount: Large Exudate Type: Serosanguineous Exudate Color: red, brown Foul Odor After Cleansing: No Slough/Fibrino Yes Wound Bed Granulation  Amount: Medium (34-66%) Exposed Structure Granulation  Quality: Red, Pink Fascia Exposed: No Necrotic Amount: Medium (34-66%) Fat Layer (Subcutaneous Tissue) Exposed: Yes Tendon Exposed: No Muscle Exposed: Yes Necrosis of Muscle: No Joint Exposed: No Bone Exposed: Yes Treatment Notes Wound #5 (Gluteus) Wound Laterality: Right, Midline Cleanser [employer] Ancillary Kit - 15 Day Supply Discharge Instruction: Use supplies as instructed; Kit contains: (15) Saline Bullets; (15) 3x3 Gauze; 15 pr Gloves [place], [place] ([id number]) [id number] Page 8 of 8 Peri-Wound Care Topical Primary Dressing Hydrofera Blue Ready Transfer Foam, 4x5 (in/in) Discharge Instruction: spiral a piece of blue and gently pack into wound, then cover with another piece Secondary Dressing Zetuvit Plus 4x4 (in/in) Secured With Medipore T - 69M Medipore H Soft Cloth Surgical T ape ape, 2x2 (in/yd) Compression Wrap Compression Stockings Add-Ons Electronic Signature(s) Signed: 10/20/2023 4:05:16 PM By: Angelina Pih Signed: 10/20/2023 4:38:07 PM By: Betha Loa Entered By: Betha Loa on 10/20/2023 14:17:12 -------------------------------------------------------------------------------- Vitals Details Patient Name: Date of Service: [name] TH, [name] 10/20/2023 1:45 PM Medical Record Number: [id number] Patient Account Number: [id number] Date of Birth/Sex: Treating RN: [birth year] (58 y.o. [name] Primary Care Amaura Authier: Aram Beecham Other Clinician: Betha Loa Referring Demarea Lorey: Treating Haidar Muse/Extender: Hermine Messick Weeks in Treatment: 29 Vital Signs Time Taken: 14:07 Temperature (F): 98.3 Weight (lbs): 90 Pulse (bpm): 96 Respiratory Rate (breaths/min): 18 Blood Pressure (mmHg): 132/80 Reference Range: 80 - 120 mg / dl Electronic Signature(s) Signed: 10/20/2023 4:38:07 PM By: Betha Loa Entered By: Betha Loa on 10/20/2023  14:07:50

## 2023-10-20 NOTE — Progress Notes (Addendum)
[place], [name] ([id number]) [id number] Page 1 of 9 Visit Report for 10/20/2023 Chief Complaint Document Details Patient Name: Date of Service: [name] [facility], [name] 10/20/2023 1:45 PM Medical Record Number: [id number] Patient Account Number: [id number] Date of Birth/Sex: Treating RN: [birth year] (58 y.o. [name] Primary Care Provider: Aram [name] Other Clinician: Betha [name] Referring Provider: Treating Provider/Extender: Gabriel Earing in Treatment: 29 Information Obtained from: Patient Chief Complaint Right gluteal to midline pressure ulcer with chronic osteomyelitis Electronic Signature(s) Signed: 10/20/2023 1:19:52 PM By: Allen Derry PA-C Entered By: Allen Derry on 10/20/2023 13:19:52 -------------------------------------------------------------------------------- Debridement Details Patient Name: Date of Service: [name] TH, [name] 10/20/2023 1:45 PM Medical Record Number: [id number] Patient Account Number: [id number] Date of Birth/Sex: Treating RN: [birth year] (58 y.o. [name] Primary Care Provider: Aram [name] Other Clinician: Betha [name] Referring Provider: Treating Provider/Extender: [name] Weeks in Treatment: 29 Debridement Performed for Assessment: Wound #5 Right,Midline Gluteus Performed By: Physician Allen Derry, PA-C The following information was scribed by: [name] The information was scribed for: Allen Derry Debridement Type: Debridement Level of Consciousness (Pre-procedure): Awake and Alert Pre-procedure Verification/Time Out Yes - 14:25 Taken: Start Time: 14:25 Percent of Wound Bed Debrided: 100% T Area Debrided (cm): otal 45.22 Tissue and other material debrided: Viable, Non-Viable, Slough, Subcutaneous, Biofilm, Slough Level: Skin/Subcutaneous Tissue Debridement Description: Excisional Instrument: Curette Bleeding: Minimum Hemostasis  Achieved: Pressure Response to Treatment: Procedure was tolerated well Level of Consciousness [name] [place], [name] ([id number]) [id number] Page 2 of 9 Level of Consciousness ([name] Awake and Alert procedure): [name] Debridement Measurements of Total Wound Length: (cm) 6.4 Stage: Category/Stage IV Width: (cm) 9 Depth: (cm) 2 Volume: (cm) 90.478 Character of Wound/Ulcer [name] Debridement: Stable [name] Procedure Diagnosis Same as Pre-procedure Electronic Signature(s) Signed: 10/20/2023 4:05:16 PM By: [name] Signed: 10/20/2023 4:38:07 PM By: [name] Signed: 10/20/2023 6:05:15 PM By: Allen Derry PA-C Entered By: [name] on 10/20/2023 14:27:38 -------------------------------------------------------------------------------- HPI Details Patient Name: Date of Service: [name] TH, [name] 10/20/2023 1:45 PM Medical Record Number: [id number] Patient Account Number: [id number] Date of Birth/Sex: Treating RN: 11/22/1965 (58 y.o. [name] Primary Care Provider: Aram [name] Other Clinician: Betha [name] Referring Provider: Treating Provider/Extender: Gabriel Earing in Treatment: 29 History of Present Illness HPI Description: Readmission: 03-31-2023 this is a gentleman whom I have seen multiple times intermittently throughout the years compliance has been a real issue however. I do feel like there is some significant issues here still with the fact that dressing changes and keeping dressings in place are still pretty much as a significant problem here for [place]. I do not see any signs of active infection at this time which is great news but at the same time the wounds do not worse in fact he has another wound different than what I even noted previous. Subsequently he I do believe that the patient should continue to do monitor for any evidence of infection or worsening. Overall I think he needs to have regular  follow-up visits with [place] and I think that he needs to have dressings in place at all times. He tells me that he is at home primarily by himself transferring himself from the first thing in the morning through at least 3 in the afternoon as his sister works he really needs somebody gets a caregiver with him throughout the day. 10-20-2023 upon evaluation today patient appears to be doing poorly currently in regard to his wound.  He has been tolerating the dressing changes without complication he tells me although he tells me he is also had a lot of bleeding. This is actually larger and does not appear to be doing nearly as well as what of seen in fact I think this is the biggest has been in quite some time. I do believe that this is unfortunate I discussed with him that I do not know what he is doing at home in order to add additional pressure to the area but I do not think that if he continues with what he is doing right now that he is can to see this ever really improving completely healed. He voiced understanding he tells me that he "is not sure exactly what is going on. Nonetheless I do think that he needs to be very cautious about pressure getting to this region I think that this is going to be an ongoing concern if we do not see some change in his daily activities in general at this point. Electronic Signature(s) Signed: 10/20/2023 2:58:30 PM By: Allen Derry PA-C Entered By: Allen Derry on 10/20/2023 14:58:29 [name] ([id number]) [id number] Page 3 of 9 -------------------------------------------------------------------------------- Physical Exam Details Patient Name: Date of ServiceEulas [name] [facility], [name] 10/20/2023 1:45 PM Medical Record Number: [id number] Patient Account Number: [id number] Date of Birth/Sex: Treating RN: 12-Feb-1966 (58 y.o. [name] Primary Care Provider: Aram [name] Other Clinician: Betha [name] Referring  Provider: Treating Provider/Extender: [name] Weeks in Treatment: 29 Constitutional Well-nourished and well-hydrated in no acute distress. Respiratory normal breathing without difficulty. Psychiatric this patient is able to make decisions and demonstrates good insight into disease process. Alert and Oriented x 3. pleasant and cooperative. Notes Patient's wound unfortunately shows significant amount of deep tissue injury which is not good. I am very concerned about the fact that he is getting increased pressure and I think this is because of the wound to be better as well. I discussed that with him today. I believe that he is going to need to be much more conscientious about keeping pressure off of this area. I did perform debridement clearway is much as I could. Electronic Signature(s) Signed: 10/20/2023 3:01:02 PM By: Allen Derry PA-C Entered By: Allen Derry on 10/20/2023 15:01:02 -------------------------------------------------------------------------------- Physician Orders Details Patient Name: Date of Service: [name] TH, [name] 10/20/2023 1:45 PM Medical Record Number: [id number] Patient Account Number: [id number] Date of Birth/Sex: Treating RN: 07-30-1966 (58 y.o. [name] Primary Care Provider: Aram [name] Other Clinician: Betha [name] Referring Provider: Treating Provider/Extender: Gabriel Earing in Treatment: 29 The following information was scribed by: [name] The information was scribed for: Allen Derry Verbal / Phone Orders: No Diagnosis Coding ICD-10 Coding Code Description M86.68 Other chronic osteomyelitis, other site L89.314 Pressure ulcer of right buttock, stage 4 Q76.0 Spina bifida occulta Z93.3 Colostomy status I10 Essential (primary) hypertension [place], [name] ([id number]) [id number] Page 4 of 9 I25.10 Atherosclerotic heart disease of native coronary artery  without angina pectoris Follow-up Appointments Return Appointment in 1 week. Bathing/ [employer] wounds with antibacterial soap and water. Anesthetic (Use 'Patient Medications' Section for Anesthetic Order Entry) Lidocaine applied to wound bed Off-Loading Gel wheelchair cushion Hospital bed/mattress - ETHOS Low air-loss mattress (Group 2) - HOSPITAL BED WITH GROUPE 2 AIR MATTRESS AND TRAPEZE BAR ETHOS Turn and reposition every 2 hours Additional Orders / Instructions Other: - apply calmoseptine or AandD ointment to left glut area daily for protection change  dressing every morning and every evening Wound Treatment Wound #5 - Gluteus Wound Laterality: Right, Midline Cleanser: Byram Ancillary Kit - 15 Day Supply (Generic) 1 x Per Day/30 Days Discharge Instructions: Use supplies as instructed; Kit contains: (15) Saline Bullets; (15) 3x3 Gauze; 15 pr Gloves Prim Dressing: Hydrofera Blue Ready Transfer Foam, 4x5 (in/in) (Dispense As Written) 1 x Per Day/30 Days ary Discharge Instructions: spiral a piece of blue and gently pack into wound, then cover with another piece Secondary Dressing: Zetuvit Plus 4x4 (in/in) 1 x Per Day/30 Days Secured With: Medipore T - 42M Medipore H Soft Cloth Surgical T ape ape, 2x2 (in/yd) (Dispense As Written) 1 x Per Day/30 Days Electronic Signature(s) Signed: 10/20/2023 4:38:07 PM By: [name] Signed: 10/20/2023 6:05:15 PM By: Allen Derry PA-C Entered By: [name] on 10/20/2023 14:27:52 Prescription 10/20/2023 -------------------------------------------------------------------------------- [name] PA-C Patient Name: Provider: 1966-03-14 [id number] Date of Birth: NPI#: [id number] Sex: DEA #: [phone] Phone #: License #: UPN: Patient Address: [address] [facility] [address] [facility] [place], [place] [address] [address]  [address] [place], [place] [address] [phone] Allergies Iodinated Contrast Media; Sulfa (Sulfonamide Antibiotics); latex; morphine [facility] Vic Blackbird [place], [place] ([id number]) [id number] Page 5 of 9 Hand Signature: Date(s): Prescription 10/20/2023 [name] PA-C Patient Name: Provider: Oct 07, 1965 [id number] Date of Birth: NPI#: [name] [id number] Sex: DEA #: [phone] Phone #: License #: UPN: Patient Address: [address] [facility] [address] [facility] [place], [place] [address] [address] [place], [place] [address] [phone] Allergies Iodinated Contrast Media; Sulfa (Sulfonamide Antibiotics); latex; morphine Provider's Orders Low air-loss mattress (Group 2) - HOSPITAL BED WITH GROUPE 2 AIR MATTRESS AND TRAPEZE BAR ETHOS Hand Signature: Date(s): Electronic Signature(s) Signed: 10/20/2023 4:38:07 PM By: [name] Signed: 10/20/2023 6:05:15 PM By: Allen Derry PA-C Entered By: [name] on 10/20/2023 14:27:52 -------------------------------------------------------------------------------- Problem List Details Patient Name: Date of Service: [name] TH, [name] 10/20/2023 1:45 PM Medical Record Number: [id number] Patient Account Number: [id number] Date of Birth/Sex: Treating RN: June 12, 1966 (58 y.o. [name] Primary Care Provider: Aram [name] Other Clinician: Betha [name] Referring Provider: Treating Provider/Extender: [name] Weeks in Treatment: 29 Active Problems ICD-10 Encounter Code Description Active Date MDM Diagnosis M86.68 Other chronic osteomyelitis, other site 05/19/2023 No Yes L89.314 Pressure ulcer of right buttock, stage 4 03/31/2023 No Yes Q76.0 Spina bifida occulta 03/31/2023 No Yes Z93.3 Colostomy status 03/31/2023 No Yes EROL, ODAM  ([id number]) [id number] Page 6 of 9 I10 Essential (primary) hypertension 03/31/2023 No Yes I25.10 Atherosclerotic heart disease of native coronary artery without angina pectoris 03/31/2023 No Yes Inactive Problems Resolved Problems ICD-10 Code Description Active Date Resolved Date L89.323 Pressure ulcer of left buttock, stage 3 07/28/2023 07/28/2023 Electronic Signature(s) Signed: 10/20/2023 1:19:40 PM By: Allen Derry PA-C Entered By: Allen Derry on 10/20/2023 13:19:40 -------------------------------------------------------------------------------- Progress Note Details Patient Name: Date of Service: [name] TH, [name] 10/20/2023 1:45 PM Medical Record Number: [id number] Patient Account Number: [id number] Date of Birth/Sex: Treating RN: 01-31-1966 (58 y.o. [name] Primary Care Provider: Aram [name] Other Clinician: Betha [name] Referring Provider: Treating Provider/Extender: Gabriel Earing in Treatment: 29 Subjective Chief Complaint Information obtained from Patient Right gluteal to midline pressure ulcer with chronic osteomyelitis History of Present Illness (HPI) Readmission: 03-31-2023 this is  a gentleman whom I have seen multiple times intermittently throughout the years compliance has been a real issue however. I do feel like there is some significant issues here still with the fact that dressing changes and keeping dressings in place are still pretty much as a significant problem here for [place]. I do not see any signs of active infection at this time which is great news but at the same time the wounds do not worse in fact he has another wound different than what I even noted previous. Subsequently he I do believe that the patient should continue to do monitor for any evidence of infection or worsening. Overall I think he needs to have regular follow-up visits with [place] and I think that he needs to have dressings in place  at all times. He tells me that he is at home primarily by himself transferring himself from the first thing in the morning through at least 3 in the afternoon as his sister works he really needs somebody gets a caregiver with him throughout the day. 10-20-2023 upon evaluation today patient appears to be doing poorly currently in regard to his wound. He has been tolerating the dressing changes without complication he tells me although he tells me he is also had a lot of bleeding. This is actually larger and does not appear to be doing nearly as well as what of seen in fact I think this is the biggest has been in quite some time. I do believe that this is unfortunate I discussed with him that I do not know what he is doing at home in order to add additional pressure to the area but I do not think that if he continues with what he is doing right now that he is can to see this ever really improving completely healed. He voiced understanding he tells me that he "is not sure exactly what is going on. Nonetheless I do think that he needs to be very cautious about pressure getting to this region I think that this is going to be an ongoing concern if we do not see some change in his daily activities in general at this point. [place], [name] ([id number]) [id number] Page 7 of 9 Objective Constitutional Well-nourished and well-hydrated in no acute distress. Vitals Time Taken: 2:07 PM, Weight: 90 lbs, Temperature: 98.3 F, Pulse: 96 bpm, Respiratory Rate: 18 breaths/min, Blood Pressure: 132/80 mmHg. Respiratory normal breathing without difficulty. Psychiatric this patient is able to make decisions and demonstrates good insight into disease process. Alert and Oriented x 3. pleasant and cooperative. General Notes: Patient's wound unfortunately shows significant amount of deep tissue injury which is not good. I am very concerned about the fact that he is getting increased pressure and  I think this is because of the wound to be better as well. I discussed that with him today. I believe that he is going to need to be much more conscientious about keeping pressure off of this area. I did perform debridement clearway is much as I could. Integumentary (Hair, Skin) Wound #5 status is Open. Original cause of wound was Gradually Appeared. The date acquired was: 10/04/2021. The wound has been in treatment 29 weeks. The wound is located on the Right,Midline Gluteus. The wound measures 6.4cm length x 9cm width x 2cm depth; 45.239cm^2 area and 90.478cm^3 volume. There is bone, muscle, and Fat Layer (Subcutaneous Tissue) exposed. There is a large amount of serosanguineous drainage noted. There is medium (34-66%) red, pink granulation within the wound  bed. There is a medium (34-66%) amount of necrotic tissue within the wound bed. Assessment Active Problems ICD-10 Other chronic osteomyelitis, other site Pressure ulcer of right buttock, stage 4 Spina bifida occulta Colostomy status Essential (primary) hypertension Atherosclerotic heart disease of native coronary artery without angina pectoris Procedures Wound #5 Pre-procedure diagnosis of Wound #5 is a Pressure Ulcer located on the Right,Midline Gluteus . There was a Excisional Skin/Subcutaneous Tissue Debridement with a total area of 45.22 sq cm performed by Allen Derry, PA-C. With the following instrument(s): Curette to remove Viable and Non-Viable tissue/material. Material removed includes Subcutaneous Tissue, Slough, and Biofilm. A time out was conducted at 14:25, prior to the start of the procedure. A Minimum amount of bleeding was controlled with Pressure. The procedure was tolerated well. [name] Debridement Measurements: 6.4cm length x 9cm width x 2cm depth; 90.478cm^3 volume. [name] debridement Stage noted as Category/Stage IV. Character of Wound/Ulcer [name] Debridement is stable. [name] procedure Diagnosis Wound #5: Same as  Pre-Procedure Plan Follow-up Appointments: Return Appointment in 1 week. Bathing/ Shower/ Hygiene: Wash wounds with antibacterial soap and water. Anesthetic (Use 'Patient Medications' Section for Anesthetic Order Entry): Lidocaine applied to wound bed Off-Loading: Gel wheelchair cushion Hospital bed/mattress - ETHOS Low air-loss mattress (Group 2) - HOSPITAL BED WITH GROUPE 2 AIR MATTRESS AND TRAPEZE BAR ETHOS Turn and reposition every 2 hours Additional Orders / Instructions: Other: - apply calmoseptine or AandD ointment to left glut area daily for protection change dressing every morning and every evening WOUND #5: - Gluteus Wound Laterality: Right, Midline Cleanser: Byram Ancillary Kit - 15 Day Supply (Generic) 1 x Per Day/30 Days Discharge Instructions: Use supplies as instructed; Kit contains: (15) Saline Bullets; (15) 3x3 Gauze; 15 pr Gloves Prim Dressing: Hydrofera Blue Ready Transfer Foam, 4x5 (in/in) (Dispense As Written) 1 x Per Day/30 Days ary Discharge Instructions: spiral a piece of blue and gently pack into wound, then cover with another piece Secondary Dressing: Zetuvit Plus 4x4 (in/in) 1 x Per Day/30 Days Secured With: Medipore T - 18M Medipore H Soft Cloth Surgical T ape ape, 2x2 (in/yd) (Dispense As Written) 1 x Per Day/30 Days [name] ([id number]) [id number] Page 8 of 9 1. I would recommend based on what we are seeing right now that he needs to continue with the [facility] but more importantly than what we are putting on it he needs to keep pressure off of this area. He voiced understanding. 2. I am going to recommend as well that he needs to be very diligent about trying to figure out where he is get the pressure at also think he needs to be using a slide board instead of trying to get into his chair without it as that may be causing some trouble here as well. 3. Should continue with the Zetuvit or ABD pads over top of the  Hydrofera Blue to hold everything in place. His sister changes the dressings. We will see patient back for reevaluation in 1 week here in the clinic. If anything worsens or changes patient will contact our office for additional recommendations. Electronic Signature(s) Signed: 10/20/2023 3:01:40 PM By: Allen Derry PA-C Entered By: Allen Derry on 10/20/2023 15:01:40 -------------------------------------------------------------------------------- SuperBill Details Patient Name: Date of Service: [name] TH, [name] 10/20/2023 Medical Record Number: [id number] Patient Account Number: [id number] Date of Birth/Sex: Treating RN: 01/27/1966 (58 y.o. [name] Primary Care Provider: Aram [name] Other Clinician: Betha [name] Referring Provider: Treating Provider/Extender: [name] Weeks in Treatment: 29 Diagnosis  Coding ICD-10 Codes Code Description M86.68 Other chronic osteomyelitis, other site L89.314 Pressure ulcer of right buttock, stage 4 Q76.0 Spina bifida occulta Z93.3 Colostomy status I10 Essential (primary) hypertension I25.10 Atherosclerotic heart disease of native coronary artery without angina pectoris Facility Procedures : CPT4 Code: [id number] Description: 11042 - DEB SUBQ TISSUE 20 SQ CM/< ICD-10 Diagnosis Description L89.314 Pressure ulcer of right buttock, stage 4 Modifier: Quantity: 1 : CPT4 Code: [id number] Description: 11045 - DEB SUBQ TISS EA ADDL 20CM ICD-10 Diagnosis Description L89.314 Pressure ulcer of right buttock, stage 4 Modifier: Quantity: 2 Physician Procedures : CPT4 Code Description Modifier 11042 11042 - WC PHYS SUBQ TISS 20 SQ CM ICD-10 Diagnosis Description L89.314 Pressure ulcer of right buttock, stage 4 Quantity: 1 : [id number] 11045 - WC PHYS SUBQ TISS EA ADDL 20 CM ICD-10 Diagnosis Description L89.314 Pressure ulcer of right buttock, stage 4 Quantity: 2 Electronic Signature(s) [place], [name] ([id number])  [id number] Page 9 of 9 Signed: 10/20/2023 3:01:57 PM By: Allen Derry PA-C Entered By: Allen Derry on 10/20/2023 15:01:57

## 2023-10-27 ENCOUNTER — Ambulatory Visit: Payer: 59 | Admitting: Physician Assistant

## 2023-11-04 ENCOUNTER — Encounter: Payer: 59 | Admitting: Physician Assistant

## 2023-11-04 DIAGNOSIS — L89323 Pressure ulcer of left buttock, stage 3: Secondary | ICD-10-CM | POA: Diagnosis not present

## 2023-11-10 ENCOUNTER — Ambulatory Visit: Payer: 59 | Admitting: Physician Assistant

## 2023-12-23 ENCOUNTER — Inpatient Hospital Stay (HOSPITAL_COMMUNITY): Admitting: Registered Nurse

## 2023-12-23 ENCOUNTER — Emergency Department (HOSPITAL_COMMUNITY)

## 2023-12-23 ENCOUNTER — Encounter (HOSPITAL_COMMUNITY): Payer: Self-pay

## 2023-12-23 ENCOUNTER — Encounter (HOSPITAL_COMMUNITY)
Admission: EM | Disposition: A | Discharge status: Home / Self Care | Payer: Self-pay | Source: Home / Self Care | Attending: Internal Medicine

## 2023-12-23 ENCOUNTER — Inpatient Hospital Stay (HOSPITAL_COMMUNITY)
Admission: EM | Admit: 2023-12-23 | Discharge: 2023-12-30 | DRG: 853 | Disposition: A | Discharge status: Home / Self Care | Attending: Internal Medicine | Admitting: Internal Medicine

## 2023-12-23 DIAGNOSIS — E785 Hyperlipidemia, unspecified: Secondary | ICD-10-CM | POA: Diagnosis present

## 2023-12-23 DIAGNOSIS — E876 Hypokalemia: Secondary | ICD-10-CM | POA: Diagnosis present

## 2023-12-23 DIAGNOSIS — L89319 Pressure ulcer of right buttock, unspecified stage: Secondary | ICD-10-CM | POA: Diagnosis present

## 2023-12-23 DIAGNOSIS — N179 Acute kidney failure, unspecified: Secondary | ICD-10-CM | POA: Diagnosis present

## 2023-12-23 DIAGNOSIS — R54 Age-related physical debility: Secondary | ICD-10-CM | POA: Diagnosis present

## 2023-12-23 DIAGNOSIS — Z993 Dependence on wheelchair: Secondary | ICD-10-CM

## 2023-12-23 DIAGNOSIS — K219 Gastro-esophageal reflux disease without esophagitis: Secondary | ICD-10-CM | POA: Diagnosis present

## 2023-12-23 DIAGNOSIS — Z936 Other artificial openings of urinary tract status: Secondary | ICD-10-CM

## 2023-12-23 DIAGNOSIS — N319 Neuromuscular dysfunction of bladder, unspecified: Secondary | ICD-10-CM | POA: Diagnosis present

## 2023-12-23 DIAGNOSIS — E871 Hypo-osmolality and hyponatremia: Secondary | ICD-10-CM | POA: Diagnosis present

## 2023-12-23 DIAGNOSIS — M8618 Other acute osteomyelitis, other site: Secondary | ICD-10-CM | POA: Diagnosis present

## 2023-12-23 DIAGNOSIS — L89154 Pressure ulcer of sacral region, stage 4: Secondary | ICD-10-CM | POA: Diagnosis present

## 2023-12-23 DIAGNOSIS — Z833 Family history of diabetes mellitus: Secondary | ICD-10-CM

## 2023-12-23 DIAGNOSIS — E1165 Type 2 diabetes mellitus with hyperglycemia: Secondary | ICD-10-CM | POA: Diagnosis present

## 2023-12-23 DIAGNOSIS — B966 Bacteroides fragilis [B. fragilis] as the cause of diseases classified elsewhere: Secondary | ICD-10-CM | POA: Diagnosis present

## 2023-12-23 DIAGNOSIS — G4700 Insomnia, unspecified: Secondary | ICD-10-CM | POA: Diagnosis present

## 2023-12-23 DIAGNOSIS — I10 Essential (primary) hypertension: Secondary | ICD-10-CM | POA: Diagnosis present

## 2023-12-23 DIAGNOSIS — Z885 Allergy status to narcotic agent status: Secondary | ICD-10-CM

## 2023-12-23 DIAGNOSIS — D500 Iron deficiency anemia secondary to blood loss (chronic): Secondary | ICD-10-CM | POA: Diagnosis present

## 2023-12-23 DIAGNOSIS — Z933 Colostomy status: Secondary | ICD-10-CM

## 2023-12-23 DIAGNOSIS — I96 Gangrene, not elsewhere classified: Secondary | ICD-10-CM | POA: Diagnosis present

## 2023-12-23 DIAGNOSIS — R6521 Severe sepsis with septic shock: Secondary | ICD-10-CM | POA: Diagnosis present

## 2023-12-23 DIAGNOSIS — L89153 Pressure ulcer of sacral region, stage 3: Secondary | ICD-10-CM | POA: Diagnosis not present

## 2023-12-23 DIAGNOSIS — I129 Hypertensive chronic kidney disease with stage 1 through stage 4 chronic kidney disease, or unspecified chronic kidney disease: Secondary | ICD-10-CM | POA: Diagnosis not present

## 2023-12-23 DIAGNOSIS — L89329 Pressure ulcer of left buttock, unspecified stage: Secondary | ICD-10-CM | POA: Diagnosis present

## 2023-12-23 DIAGNOSIS — Q059 Spina bifida, unspecified: Secondary | ICD-10-CM | POA: Diagnosis not present

## 2023-12-23 DIAGNOSIS — M86652 Other chronic osteomyelitis, left thigh: Secondary | ICD-10-CM | POA: Diagnosis present

## 2023-12-23 DIAGNOSIS — Z91041 Radiographic dye allergy status: Secondary | ICD-10-CM

## 2023-12-23 DIAGNOSIS — D75839 Thrombocytosis, unspecified: Secondary | ICD-10-CM | POA: Diagnosis present

## 2023-12-23 DIAGNOSIS — M869 Osteomyelitis, unspecified: Secondary | ICD-10-CM | POA: Diagnosis not present

## 2023-12-23 DIAGNOSIS — E8729 Other acidosis: Secondary | ICD-10-CM | POA: Diagnosis not present

## 2023-12-23 DIAGNOSIS — N1831 Chronic kidney disease, stage 3a: Secondary | ICD-10-CM | POA: Diagnosis not present

## 2023-12-23 DIAGNOSIS — A419 Sepsis, unspecified organism: Principal | ICD-10-CM | POA: Diagnosis present

## 2023-12-23 DIAGNOSIS — E872 Acidosis, unspecified: Secondary | ICD-10-CM | POA: Diagnosis present

## 2023-12-23 DIAGNOSIS — Z91040 Latex allergy status: Secondary | ICD-10-CM

## 2023-12-23 DIAGNOSIS — E1169 Type 2 diabetes mellitus with other specified complication: Secondary | ICD-10-CM | POA: Diagnosis present

## 2023-12-23 DIAGNOSIS — Z982 Presence of cerebrospinal fluid drainage device: Secondary | ICD-10-CM

## 2023-12-23 DIAGNOSIS — Z882 Allergy status to sulfonamides status: Secondary | ICD-10-CM

## 2023-12-23 DIAGNOSIS — Z79899 Other long term (current) drug therapy: Secondary | ICD-10-CM

## 2023-12-23 DIAGNOSIS — N390 Urinary tract infection, site not specified: Secondary | ICD-10-CM | POA: Diagnosis not present

## 2023-12-23 DIAGNOSIS — Z7401 Bed confinement status: Secondary | ICD-10-CM

## 2023-12-23 LAB — CBC WITH DIFFERENTIAL/PLATELET
Abs Immature Granulocytes: 0.16 10*3/uL — ABNORMAL HIGH (ref 0.00–0.07)
Basophils Absolute: 0.1 10*3/uL (ref 0.0–0.1)
Basophils Relative: 0 %
Eosinophils Absolute: 0 10*3/uL (ref 0.0–0.5)
Eosinophils Relative: 0 %
HCT: 28.5 % — ABNORMAL LOW (ref 39.0–52.0)
Hemoglobin: 8.2 g/dL — ABNORMAL LOW (ref 13.0–17.0)
Immature Granulocytes: 1 %
Lymphocytes Relative: 7 %
Lymphs Abs: 1.1 10*3/uL (ref 0.7–4.0)
MCH: 21.2 pg — ABNORMAL LOW (ref 26.0–34.0)
MCHC: 28.8 g/dL — ABNORMAL LOW (ref 30.0–36.0)
MCV: 73.8 fL — ABNORMAL LOW (ref 80.0–100.0)
Monocytes Absolute: 1 10*3/uL (ref 0.1–1.0)
Monocytes Relative: 6 %
Neutro Abs: 13.7 10*3/uL — ABNORMAL HIGH (ref 1.7–7.7)
Neutrophils Relative %: 86 %
Platelets: 743 10*3/uL — ABNORMAL HIGH (ref 150–400)
RBC: 3.86 MIL/uL — ABNORMAL LOW (ref 4.22–5.81)
RDW: 17.9 % — ABNORMAL HIGH (ref 11.5–15.5)
WBC: 16 10*3/uL — ABNORMAL HIGH (ref 4.0–10.5)
nRBC: 0 % (ref 0.0–0.2)

## 2023-12-23 LAB — URINALYSIS, W/ REFLEX TO CULTURE (INFECTION SUSPECTED)

## 2023-12-23 LAB — COMPREHENSIVE METABOLIC PANEL
ALT: 39 U/L (ref 0–44)
AST: 43 U/L — ABNORMAL HIGH (ref 15–41)
Albumin: 1.6 g/dL — ABNORMAL LOW (ref 3.5–5.0)
Alkaline Phosphatase: 113 U/L (ref 38–126)
Anion gap: 13 (ref 5–15)
BUN: 27 mg/dL — ABNORMAL HIGH (ref 6–20)
CO2: 20 mmol/L — ABNORMAL LOW (ref 22–32)
Calcium: 7.7 mg/dL — ABNORMAL LOW (ref 8.9–10.3)
Chloride: 99 mmol/L (ref 98–111)
Creatinine, Ser: 1.71 mg/dL — ABNORMAL HIGH (ref 0.61–1.24)
GFR, Estimated: 46 mL/min — ABNORMAL LOW (ref >60)
Glucose, Bld: 151 mg/dL — ABNORMAL HIGH (ref 70–99)
Potassium: 2.4 mmol/L — CL (ref 3.5–5.1)
Sodium: 132 mmol/L — ABNORMAL LOW (ref 135–145)
Total Bilirubin: 0.3 mg/dL (ref 0.0–1.2)
Total Protein: 6.4 g/dL — ABNORMAL LOW (ref 6.5–8.1)

## 2023-12-23 LAB — I-STAT CHEM 8, ED
BUN: 28 mg/dL — ABNORMAL HIGH (ref 6–20)
Calcium, Ion: 1.07 mmol/L — ABNORMAL LOW (ref 1.15–1.40)
Chloride: 99 mmol/L (ref 98–111)
Creatinine, Ser: 1.8 mg/dL — ABNORMAL HIGH (ref 0.61–1.24)
Glucose, Bld: 147 mg/dL — ABNORMAL HIGH (ref 70–99)
HCT: 27 % — ABNORMAL LOW (ref 39.0–52.0)
Hemoglobin: 9.2 g/dL — ABNORMAL LOW (ref 13.0–17.0)
Potassium: 2.4 mmol/L — CL (ref 3.5–5.1)
Sodium: 134 mmol/L — ABNORMAL LOW (ref 135–145)
TCO2: 23 mmol/L (ref 22–32)

## 2023-12-23 LAB — I-STAT CG4 LACTIC ACID, ED: Lactic Acid, Venous: 3.3 mmol/L (ref 0.5–1.9)

## 2023-12-23 LAB — PROTIME-INR
INR: 1.2 (ref 0.8–1.2)
Prothrombin Time: 15.4 s — ABNORMAL HIGH (ref 11.4–15.2)

## 2023-12-23 LAB — APTT: aPTT: 39 s — ABNORMAL HIGH (ref 24–36)

## 2023-12-23 SURGERY — IRRIGATION AND DEBRIDEMENT OF NECROTIZING SOFT TISSUE INFECTION
Anesthesia: General | Laterality: Bilateral

## 2023-12-23 MED ORDER — OXYCODONE HCL 5 MG PO TABS: 5.0000 mg | ORAL_TABLET | Freq: Once | ORAL | Status: DC | PRN

## 2023-12-23 MED ORDER — SODIUM CHLORIDE 0.9 % IV SOLN: 250.0000 mL | INTRAVENOUS | Status: AC

## 2023-12-23 MED ORDER — SUGAMMADEX SODIUM 200 MG/2ML IV SOLN
INTRAVENOUS | Status: DC | PRN
  Administered 2023-12-23 (×2): 100 mg via INTRAVENOUS

## 2023-12-23 MED ORDER — LACTATED RINGERS IV BOLUS (SEPSIS)
1000.0000 mL | Freq: Once | INTRAVENOUS | Status: AC
  Administered 2023-12-23: 1000 mL via INTRAVENOUS

## 2023-12-23 MED ORDER — ACETAMINOPHEN 325 MG PO TABS: 325.0000 mg | ORAL_TABLET | ORAL | Status: DC | PRN

## 2023-12-23 MED ORDER — OXYCODONE HCL 5 MG/5ML PO SOLN: 5.0000 mg | Freq: Once | ORAL | Status: DC | PRN

## 2023-12-23 MED ORDER — PHENYLEPHRINE 80 MCG/ML (10ML) SYRINGE FOR IV PUSH (FOR BLOOD PRESSURE SUPPORT)
PREFILLED_SYRINGE | INTRAVENOUS | Status: DC | PRN
  Administered 2023-12-23 (×2): 160 ug via INTRAVENOUS

## 2023-12-23 MED ORDER — 0.9 % SODIUM CHLORIDE (POUR BTL) OPTIME
TOPICAL | Status: DC | PRN
Start: 2023-12-23 — End: 2023-12-23
  Administered 2023-12-23: 1000 mL

## 2023-12-23 MED ORDER — LACTATED RINGERS IV BOLUS (SEPSIS)
500.0000 mL | Freq: Once | INTRAVENOUS | Status: AC
  Administered 2023-12-23: 500 mL via INTRAVENOUS

## 2023-12-23 MED ORDER — SODIUM CHLORIDE 0.9 % IV SOLN
2.0000 g | Freq: Once | INTRAVENOUS | Status: AC
  Administered 2023-12-23: 2 g via INTRAVENOUS
  Filled 2023-12-23: qty 20

## 2023-12-23 MED ORDER — PROPOFOL 10 MG/ML IV BOLUS
INTRAVENOUS | Status: DC | PRN
  Administered 2023-12-23: 70 mg via INTRAVENOUS

## 2023-12-23 MED ORDER — POTASSIUM CHLORIDE 10 MEQ/100ML IV SOLN
10.0000 meq | Freq: Once | INTRAVENOUS | Status: AC
  Administered 2023-12-23: 10 meq via INTRAVENOUS
  Filled 2023-12-23: qty 100

## 2023-12-23 MED ORDER — INSULIN ASPART 100 UNIT/ML IJ SOLN: 0.0000 [IU] | Freq: Every day | INTRAMUSCULAR | Status: DC

## 2023-12-23 MED ORDER — NOREPINEPHRINE 4 MG/250ML-% IV SOLN
2.0000 ug/min | INTRAVENOUS | Status: DC
Start: 2023-12-23 — End: 2023-12-23

## 2023-12-23 MED ORDER — LACTATED RINGERS IV BOLUS (SEPSIS)
250.0000 mL | Freq: Once | INTRAVENOUS | Status: AC
  Administered 2023-12-23: 250 mL via INTRAVENOUS

## 2023-12-23 MED ORDER — ACETAMINOPHEN 160 MG/5ML PO SOLN: 325.0000 mg | ORAL | Status: DC | PRN

## 2023-12-23 MED ORDER — VANCOMYCIN HCL IN DEXTROSE 1-5 GM/200ML-% IV SOLN
1000.0000 mg | Freq: Once | INTRAVENOUS | Status: AC
  Administered 2023-12-23: 1000 mg via INTRAVENOUS
  Filled 2023-12-23: qty 200

## 2023-12-23 MED ORDER — PIPERACILLIN-TAZOBACTAM 3.375 G IVPB
3.3750 g | Freq: Three times a day (TID) | INTRAVENOUS | Status: DC
  Administered 2023-12-24 – 2023-12-28 (×13): 3.375 g via INTRAVENOUS
  Filled 2023-12-23 (×13): qty 50

## 2023-12-23 MED ORDER — VANCOMYCIN HCL IN DEXTROSE 1-5 GM/200ML-% IV SOLN: 1000.0000 mg | INTRAVENOUS | Status: DC

## 2023-12-23 MED ORDER — HEPARIN SODIUM (PORCINE) 5000 UNIT/ML IJ SOLN
5000.0000 [IU] | Freq: Three times a day (TID) | INTRAMUSCULAR | Status: DC
  Administered 2023-12-24 – 2023-12-30 (×19): 5000 [IU] via SUBCUTANEOUS
  Filled 2023-12-23 (×20): qty 1

## 2023-12-23 MED ORDER — SODIUM CHLORIDE 0.9 % IV SOLN: 250.0000 mL | INTRAVENOUS | Status: DC

## 2023-12-23 MED ORDER — DOCUSATE SODIUM 100 MG PO CAPS: 100.0000 mg | ORAL_CAPSULE | Freq: Two times a day (BID) | ORAL | Status: DC | PRN

## 2023-12-23 MED ORDER — ONDANSETRON HCL 4 MG/2ML IJ SOLN: 4.0000 mg | Freq: Four times a day (QID) | INTRAMUSCULAR | Status: DC | PRN

## 2023-12-23 MED ORDER — LIDOCAINE 2% (20 MG/ML) 5 ML SYRINGE
INTRAMUSCULAR | Status: DC | PRN
  Administered 2023-12-23: 40 mg via INTRAVENOUS

## 2023-12-23 MED ORDER — PIPERACILLIN-TAZOBACTAM 3.375 G IVPB 30 MIN: 3.3750 g | Freq: Three times a day (TID) | INTRAVENOUS | Status: DC

## 2023-12-23 MED ORDER — DEXAMETHASONE SODIUM PHOSPHATE 10 MG/ML IJ SOLN
INTRAMUSCULAR | Status: DC | PRN
  Administered 2023-12-23: 5 mg via INTRAVENOUS

## 2023-12-23 MED ORDER — FENTANYL CITRATE (PF) 100 MCG/2ML IJ SOLN: 25.0000 ug | INTRAMUSCULAR | Status: DC | PRN

## 2023-12-23 MED ORDER — ONDANSETRON HCL 4 MG/2ML IJ SOLN
INTRAMUSCULAR | Status: DC | PRN
  Administered 2023-12-23: 4 mg via INTRAVENOUS

## 2023-12-23 MED ORDER — FENTANYL CITRATE (PF) 250 MCG/5ML IJ SOLN
INTRAMUSCULAR | Status: AC
  Filled 2023-12-23: qty 5

## 2023-12-23 MED ORDER — POLYETHYLENE GLYCOL 3350 17 G PO PACK: 17.0000 g | PACK | Freq: Every day | ORAL | Status: DC | PRN

## 2023-12-23 MED ORDER — POTASSIUM CHLORIDE CRYS ER 20 MEQ PO TBCR
40.0000 meq | EXTENDED_RELEASE_TABLET | Freq: Once | ORAL | Status: AC
  Administered 2023-12-23: 40 meq via ORAL
  Filled 2023-12-23: qty 2

## 2023-12-23 MED ORDER — INSULIN ASPART 100 UNIT/ML IJ SOLN
0.0000 [IU] | Freq: Three times a day (TID) | INTRAMUSCULAR | Status: DC
Start: 2023-12-24 — End: 2023-12-28
  Administered 2023-12-24: 3 [IU] via SUBCUTANEOUS
  Administered 2023-12-24: 1 [IU] via SUBCUTANEOUS

## 2023-12-23 MED ORDER — DROPERIDOL 2.5 MG/ML IJ SOLN: 0.6250 mg | Freq: Once | INTRAMUSCULAR | Status: DC | PRN

## 2023-12-23 MED ORDER — ORAL CARE MOUTH RINSE: 15.0000 mL | OROMUCOSAL | Status: DC | PRN

## 2023-12-23 MED ORDER — ROCURONIUM BROMIDE 10 MG/ML (PF) SYRINGE
PREFILLED_SYRINGE | INTRAVENOUS | Status: DC | PRN
Start: 2023-12-23 — End: 2023-12-24
  Administered 2023-12-23: 50 mg via INTRAVENOUS

## 2023-12-23 MED ORDER — CHLORHEXIDINE GLUCONATE CLOTH 2 % EX PADS
6.0000 | MEDICATED_PAD | Freq: Every day | CUTANEOUS | Status: DC
  Administered 2023-12-24 – 2023-12-30 (×8): 6 via TOPICAL

## 2023-12-23 MED ORDER — NOREPINEPHRINE 4 MG/250ML-% IV SOLN
2.0000 ug/min | INTRAVENOUS | Status: DC
  Administered 2023-12-23: 2 ug/min via INTRAVENOUS
  Administered 2023-12-24: 6 ug/min via INTRAVENOUS
  Administered 2023-12-25: 2 ug/min via INTRAVENOUS
  Filled 2023-12-23 (×3): qty 250

## 2023-12-23 MED ORDER — LACTATED RINGERS IV SOLN: INTRAVENOUS | Status: DC | PRN

## 2023-12-23 MED ORDER — ACETAMINOPHEN 10 MG/ML IV SOLN: 1000.0000 mg | Freq: Once | INTRAVENOUS | Status: DC | PRN

## 2023-12-23 MED ORDER — LACTATED RINGERS IV SOLN: INTRAVENOUS | Status: AC

## 2023-12-23 MED ORDER — LACTATED RINGERS IV SOLN: INTRAVENOUS | Status: DC

## 2023-12-23 MED ORDER — PROPOFOL 10 MG/ML IV BOLUS
INTRAVENOUS | Status: AC
  Filled 2023-12-23: qty 20

## 2023-12-23 SURGICAL SUPPLY — 27 items
BAG COUNTER SPONGE SURGICOUNT (BAG) ×1 IMPLANT
BLADE CLIPPER SURG (BLADE) IMPLANT
BNDG GAUZE DERMACEA FLUFF 4 (GAUZE/BANDAGES/DRESSINGS) IMPLANT
CANISTER SUCT 3000ML PPV (MISCELLANEOUS) ×1 IMPLANT
COVER SURGICAL LIGHT HANDLE (MISCELLANEOUS) ×1 IMPLANT
DRAPE LAPAROSCOPIC ABDOMINAL (DRAPES) IMPLANT
DRAPE LAPAROTOMY 100X72 PEDS (DRAPES) IMPLANT
ELECT REM PT RETURN 9FT ADLT (ELECTROSURGICAL) ×1 IMPLANT
ELECTRODE REM PT RTRN 9FT ADLT (ELECTROSURGICAL) ×1 IMPLANT
GAUZE PAD ABD 8X10 STRL (GAUZE/BANDAGES/DRESSINGS) IMPLANT
GAUZE SPONGE 4X4 12PLY STRL (GAUZE/BANDAGES/DRESSINGS) IMPLANT
GLOVE BIOGEL PI MICRO STRL 6 (GLOVE) ×1 IMPLANT
GLOVE INDICATOR 6.5 STRL GRN (GLOVE) ×1 IMPLANT
GOWN STRL REUS W/ TWL LRG LVL3 (GOWN DISPOSABLE) ×2 IMPLANT
HEMOSTAT ARISTA ABSORB 3G PWDR (HEMOSTASIS) IMPLANT
KIT BASIN OR (CUSTOM PROCEDURE TRAY) ×1 IMPLANT
KIT OSTOMY DRAINABLE 2.75 STR (WOUND CARE) IMPLANT
KIT TURNOVER KIT B (KITS) ×1 IMPLANT
NS IRRIG 1000ML POUR BTL (IV SOLUTION) ×1 IMPLANT
PACK GENERAL/GYN (CUSTOM PROCEDURE TRAY) ×1 IMPLANT
PAD ARMBOARD POSITIONER FOAM (MISCELLANEOUS) ×1 IMPLANT
PENCIL SMOKE EVACUATOR (MISCELLANEOUS) ×1 IMPLANT
POSITIONER FOAM HEAD TRAC HOLE (MISCELLANEOUS) IMPLANT
SWAB COLLECTION DEVICE MRSA (MISCELLANEOUS) IMPLANT
SWAB CULTURE ESWAB REG 1ML (MISCELLANEOUS) IMPLANT
TOWEL GREEN STERILE (TOWEL DISPOSABLE) ×1 IMPLANT
TOWEL GREEN STERILE FF (TOWEL DISPOSABLE) ×1 IMPLANT

## 2023-12-23 NOTE — ED Provider Notes (Signed)
 [place] EMERGENCY DEPARTMENT AT [facility] Provider Note   CSN: [id number] Arrival date & time: 12/23/23  1702     History  Chief Complaint  Patient presents with   Code Sepsis    [name] is a 58 y.o. male.  HPI   Patient has history of spina bifida, decubitus ulcer, hypertension, fecal impaction, gastric outlet obstruction, acid reflux, hydronephrosis, sepsis.  Patient has prior colostomy as well as diverting urostomy.  Patient states he has had trouble with the wound on his buttock and sacrum area because he is not able to turn.  He lives with his sister who helps care for him.  Patient has had a wound care team to see him recently.  They suggested that he come to the hospital to be evaluated.  Patient has noted some drainage and odor in the wounds.  He is not aware of any fevers.  He is not having any issues with his appetite  Home Medications Prior to Admission medications   Medication Sig Start Date End Date Taking? Authorizing Provider  Cranberry-Vitamin C-Probiotic (AZO CRANBERRY PO) Take 1 tablet by mouth daily.    [provider]  dicyclomine (BENTYL) 20 MG tablet Take 20 mg by mouth 2 (two) times daily.    [provider]  Multiple Vitamins-Minerals (CENTRUM MEN PO) Take 1 tablet by mouth daily.     [provider]  pantoprazole (PROTONIX) 40 MG tablet Take 1 tablet (40 mg total) by mouth daily. 04/29/19   Houston Siren, MD  potassium chloride SA (KLOR-CON) 20 MEQ tablet Take 20 mEq by mouth 3 (three) times daily.    [provider]  QUEtiapine (SEROQUEL) 25 MG tablet Take 25 mg by mouth at bedtime. 07/29/23 08/28/23  [provider]  rosuvastatin (CRESTOR) 10 MG tablet Take 10 mg by mouth at bedtime. 10/20/19   [provider]  sucralfate (CARAFATE) 1 g tablet Take 1 g by mouth 3 (three) times daily. 12/14/19   [provider]  tiZANidine (ZANAFLEX) 4 MG tablet Take 4 mg by mouth every 8  (eight) hours as needed for muscle spasms. 07/29/23   [provider]      Allergies    Morphine and codeine, Metrizamide, Sulfa antibiotics, Ivp dye [iodinated contrast media], and Latex    Review of Systems   Review of Systems  Physical Exam Updated Vital Signs BP (!) 87/46   Pulse (!) 102   Temp 98.8 F (37.1 C) (Axillary)   Resp (!) 21   SpO2 100%  Physical Exam Vitals and nursing note reviewed.  Constitutional:      Appearance: He is well-developed. He is not diaphoretic.  HENT:     Head: Normocephalic and atraumatic.     Right Ear: External ear normal.     Left Ear: External ear normal.  Eyes:     General: No scleral icterus.       Right eye: No discharge.        Left eye: No discharge.     Conjunctiva/sclera: Conjunctivae normal.  Neck:     Trachea: No tracheal deviation.  Cardiovascular:     Rate and Rhythm: Normal rate and regular rhythm.  Pulmonary:     Effort: Pulmonary effort is normal. No respiratory distress.     Breath sounds: Normal breath sounds. No stridor. No wheezing or rales.  Abdominal:     General: Bowel sounds are normal. There is no distension.     Palpations: Abdomen  is soft.     Tenderness: There is no abdominal tenderness. There is no guarding or rebound.  Musculoskeletal:        General: No tenderness or deformity.     Cervical back: Neck supple.     Comments: Large decubitus ulcers stage III stage IV in the sacral region, there were at least 3 separate areas unclear if they are communicating, foul-smelling drainage noted, some nonviable tissue noted at the wound edges  Skin:    General: Skin is warm and dry.     Findings: No rash.  Neurological:     General: No focal deficit present.     Mental Status: He is alert.     Cranial Nerves: No cranial nerve deficit, dysarthria or facial asymmetry.     Sensory: No sensory deficit.     Motor: No abnormal muscle tone or seizure activity.     Coordination: Coordination normal.   Psychiatric:        Mood and Affect: Mood normal.     ED Results / Procedures / Treatments   Labs (all labs ordered are listed, but only abnormal results are displayed) Labs Reviewed  COMPREHENSIVE METABOLIC PANEL - Abnormal; Notable for the following components:      Result Value   Sodium 132 (*)    Potassium 2.4 (*)    CO2 20 (*)    Glucose, Bld 151 (*)    BUN 27 (*)    Creatinine, Ser 1.71 (*)    Calcium 7.7 (*)    Total Protein 6.4 (*)    Albumin 1.6 (*)    AST 43 (*)    GFR, Estimated 46 (*)    All other components within normal limits  CBC WITH DIFFERENTIAL/PLATELET - Abnormal; Notable for the following components:   WBC 16.0 (*)    RBC 3.86 (*)    Hemoglobin 8.2 (*)    HCT 28.5 (*)    MCV 73.8 (*)    MCH 21.2 (*)    MCHC 28.8 (*)    RDW 17.9 (*)    Platelets 743 (*)    Neutro Abs 13.7 (*)    Abs Immature Granulocytes 0.16 (*)    All other components within normal limits  PROTIME-INR - Abnormal; Notable for the following components:   Prothrombin Time 15.4 (*)    All other components within normal limits  APTT - Abnormal; Notable for the following components:   aPTT 39 (*)    All other components within normal limits  URINALYSIS, W/ REFLEX TO CULTURE (INFECTION SUSPECTED) - Abnormal; Notable for the following components:   APPearance CLOUDY (*)    Glucose, UA RESULTS UNAVAILABLE DUE TO INTERFERING SUBSTANCE (*)    Hgb urine dipstick RESULTS UNAVAILABLE DUE TO INTERFERING SUBSTANCE (*)    Bilirubin Urine RESULTS UNAVAILABLE DUE TO INTERFERING SUBSTANCE (*)    Ketones, ur RESULTS UNAVAILABLE DUE TO INTERFERING SUBSTANCE (*)    Protein, ur RESULTS UNAVAILABLE DUE TO INTERFERING SUBSTANCE (*)    Nitrite RESULTS UNAVAILABLE DUE TO INTERFERING SUBSTANCE (*)    Leukocytes,Ua RESULTS UNAVAILABLE DUE TO INTERFERING SUBSTANCE (*)    Bacteria, UA MANY (*)    All other components within normal limits  I-STAT CG4 LACTIC ACID, ED - Abnormal; Notable for the following  components:   Lactic Acid, Venous 3.3 (*)    All other components within normal limits  I-STAT CHEM 8, ED - Abnormal; Notable for the following components:   Sodium 134 (*)    Potassium  2.4 (*)    BUN 28 (*)    Creatinine, Ser 1.80 (*)    Glucose, Bld 147 (*)    Calcium, Ion 1.07 (*)    Hemoglobin 9.2 (*)    HCT 27.0 (*)    All other components within normal limits  CULTURE, BLOOD (ROUTINE X 2)  CULTURE, BLOOD (ROUTINE X 2)  URINE CULTURE  I-STAT CG4 LACTIC ACID, ED    EKG None  Radiology DG Chest Port 1 View Result Date: 12/23/2023 CLINICAL DATA:  Possible sepsis. EXAM: PORTABLE CHEST 1 VIEW COMPARISON:  08/08/2023. FINDINGS: The heart size and mediastinal contours are within normal limits. No consolidation, effusion, or pneumothorax. The bony structures are stable. IMPRESSION: No active disease. Electronically Signed   By: Thornell Sartorius M.D.   On: 12/23/2023 18:58    Procedures .Critical Care  Performed by: Linwood Dibbles, MD Authorized by: Linwood Dibbles, MD   Critical care provider statement:    Critical care time (minutes):  55   Critical care was time spent personally by me on the following activities:  Development of treatment plan with patient or surrogate, discussions with consultants, evaluation of patient's response to treatment, examination of patient, ordering and review of laboratory studies, ordering and review of radiographic studies, ordering and performing treatments and interventions, pulse oximetry, re-evaluation of patient's condition and review of old charts     Medications Ordered in ED Medications  lactated ringers infusion ( Intravenous New Bag/Given 12/23/23 2043)  potassium chloride 10 mEq in 100 mL IVPB (10 mEq Intravenous New Bag/Given 12/23/23 2032)  0.9 %  sodium chloride infusion (250 mLs Intravenous Not Given 12/23/23 2021)  norepinephrine (LEVOPHED) 4mg  in (0.016 mg/mL) premix infusion (3 mcg/min Intravenous Rate/Dose Change 12/23/23 2044)   lactated ringers bolus 1,000 mL (0 mLs Intravenous Stopped 12/23/23 2018)  lactated ringers bolus 1,000 mL (0 mLs Intravenous Stopped 12/23/23 1957)    And  lactated ringers bolus 500 mL (0 mLs Intravenous Stopped 12/23/23 2040)    And  lactated ringers bolus 250 mL (0 mLs Intravenous Stopped 12/23/23 2040)  cefTRIAXone (ROCEPHIN) 2 g in sodium chloride 0.9 % 100 mL IVPB (0 g Intravenous Stopped 12/23/23 1854)  vancomycin (VANCOCIN) IVPB 1000 mg/200 mL premix (0 mg Intravenous Stopped 12/23/23 2018)  potassium chloride SA (KLOR-CON M) CR tablet 40 mEq (40 mEq Oral Given 12/23/23 1859)    ED Course/ Medical Decision Making/ A&P Clinical Course as of 12/23/23 2102  Fri Dec 23, 2023  1757 Notified patient's blood pressure has decreased.  Will initiate full sepsis protocol [JK]  1855 Lactic acid is elevated at 3.3.  I-STAT metabolic panel notable for hypokalemia.  Will order p.o. potassium replacement right now as patient is tolerating p.o. [JK]  1855  and weight on CMET [JK]  1926 CBC with Differential(!) WBC elevated, anemia worsening [JK]  2000 Blood pressure remains soft.  Mean arterial blood pressure was slightly above 60.  Patient is still receiving his fluid bolus. [JK]  2008 Case discussed with Dr Sherryll Burger, PCCM [JK]  2016 Case discussed with Dr. Azucena Cecil general surgery will see patient in consultation [JK]    Clinical Course User Index [JK] Linwood Dibbles, MD                                 Medical Decision Making Problems Addressed: AKI (acute kidney injury) [facility]): acute illness or injury that poses a threat to life or  bodily functions Hypokalemia: acute illness or injury that poses a threat to life or bodily functions Pressure injury of sacral region, stage 3 [facility]): chronic illness or injury with exacerbation, progression, or side effects of treatment that poses a threat to life or bodily functions Sepsis, due to unspecified organism, unspecified whether acute organ dysfunction present  [facility]): acute illness or injury that poses a threat to life or bodily functions  Amount and/or Complexity of Data Reviewed Labs: ordered. Decision-making details documented in ED Course. Radiology: ordered and independent interpretation performed.  Risk Prescription drug management. Decision regarding hospitalization.   Patient presented to the ED for evaluation of worsening sacral wound.  Patient has history of cerebral palsy and is unable to ambulate.  He states he has a difficult time not lying on his back.  Patient noted to have malodorous draining wounds that are extensive in his sacrum.  I am concerned a possible deep space infection as well as osteomyelitis.  Patient did present hypotensive with findings concerning for evolving sepsis and shock.  Patient started on antibiotics as well as IV fluid boluses.  Blood pressure remains soft.  Chest x-ray without signs of pneumonia.  Urinalysis pending but I suspect his sacral wounds are the source of infection.    Patient also notably hypokalemic.  IV and oral potassium ordered.  Will consult with pulmonary critical care for admission.  Case also discussed with general surgery.       Final Clinical Impression(s) / ED Diagnoses Final diagnoses:  Sepsis, due to unspecified organism, unspecified whether acute organ dysfunction present (HCC)  Hypokalemia  Pressure injury of sacral region, stage 3 (HCC)  AKI (acute kidney injury) [facility])    Rx / DC Orders ED Discharge Orders     None         Linwood Dibbles, MD 12/23/23 2103

## 2023-12-23 NOTE — Anesthesia Preprocedure Evaluation (Signed)
 Anesthesia Evaluation  Patient identified by MRN, date of birth, ID band Patient awake    Reviewed: Allergy & Precautions, NPO status , Patient's Chart, lab work & pertinent test results  Airway Mallampati: III  TM Distance: >3 FB Neck ROM: Full    Dental  (+) Poor Dentition, Chipped, Dental Advisory Given   Pulmonary neg pulmonary ROS   breath sounds clear to auscultation       Cardiovascular hypertension,  Rhythm:Regular Rate:Normal     Neuro/Psych  Neuromuscular disease  negative psych ROS   GI/Hepatic Neg liver ROS, PUD,GERD  Medicated,,  Endo/Other    Renal/GU Renal disease     Musculoskeletal   Abdominal   Peds  Hematology   Anesthesia Other Findings - CP - Spina bifida  Reproductive/Obstetrics                             Anesthesia Physical Anesthesia Plan  ASA: 3 and emergent  Anesthesia Plan: General   Post-op Pain Management:    Induction: Intravenous  PONV Risk Score and Plan: 3 and Ondansetron, Midazolam and Treatment may vary due to age or medical condition  Airway Management Planned: Oral ETT  Additional Equipment: None  Intra-op Plan:   Post-operative Plan: Extubation in OR  Informed Consent: I have reviewed the patients History and Physical, chart, labs and discussed the procedure including the risks, benefits and alternatives for the proposed anesthesia with the patient or authorized representative who has indicated his/her understanding and acceptance.       Plan Discussed with: CRNA  Anesthesia Plan Comments:        Anesthesia Quick Evaluation

## 2023-12-23 NOTE — Consult Note (Signed)
 Reason for Consult:  Sepsis secondary to sacral wound Referring Provider: Lynelle Doctor, MD  HPI  [name] is an 58 y.o. male with history of spina bifida, decubitus ulcer, HTN, gastric outlet obstruction, acid reflux, hydronephrosis history of prior colostomy and diverting urostomy who is presenting with wound concerns and sepsis.  He lives with his sister. Patient unable to turn contributing to buttocks and sacral wounds and care for these. A wound care team evaluated patient at home and recommended evaluation at hospital. Patient states this was several days ago. He was hesitant to come to ED because he knew he would likely need an operation and admission which he was hesitant about. Patient reports drainage and malodor from wounds. Denies fevers. No changes in appetite. No nausea/emesis.  Labs notable for lactate of 3.3, elevated creatinine of 1.8, hypokalemia and hypocalcemia, mild hyponatremia. Patient does have leukocytosis to 16.0 and is anemic: 9.2/27.0. UA with many bacteria.  Patient is tachycardic and hypotensive in ED. Requiring levo infusion to maintain adequate blood pressure.  10 point review of systems is negative except as listed above in HPI.  Objective  Past Medical History: Past Medical History:  Diagnosis Date   Abdominal distention 10/11/2016   Acute lower UTI 12/22/2019   Acute sepsis (HCC) 04/21/2016   AKI (acute kidney injury) (HCC) 06/27/2020   Decubitus ulcer    Distal intestinal obstruction syndrome (HCC) 10/11/2016   Fecal impaction in rectum (HCC) 10/10/2016   Gastric outlet obstruction 06/03/2016   Gastritis    GERD (gastroesophageal reflux disease) 12/22/2019   HTN (hypertension) 06/27/2020   Hydronephrosis    Hyperlipidemia    Hypokalemia 10/11/2016   Impaction of the bowels (HCC)    Kidney stone    Left ureteral stone 06/27/2020   Leukocytosis 10/11/2016   Loose stools 10/11/2016   Nausea & vomiting 06/03/2016   Renal disorder    SBO (small bowel  obstruction) (HCC) 11/02/2017   Sepsis due to gram-negative UTI (HCC) 03/08/2021   Septic shock (HCC) 03/08/2021   SIRS (systemic inflammatory response syndrome) (HCC) 06/03/2016   Spina bifida (HCC)    Ulcer of esophagus with bleeding     Past Surgical History: Past Surgical History:  Procedure Laterality Date   ABDOMINAL SURGERY     APPENDECTOMY     BACK SURGERY     COLON SURGERY     CREATION / REVISION OF ILEOSTOMY / JEJUNOSTOMY  05/2015   CYSTOSCOPY WITH RETROGRADE PYELOGRAM, URETEROSCOPY AND STENT PLACEMENT  03/2014   ESOPHAGOGASTRODUODENOSCOPY (EGD) WITH PROPOFOL N/A 06/03/2016   Procedure: ESOPHAGOGASTRODUODENOSCOPY (EGD) WITH PROPOFOL;  Surgeon: Midge Minium, MD;  Location: [facility] ENDOSCOPY;  Service: Endoscopy;  Laterality: N/A;   ESOPHAGOGASTRODUODENOSCOPY (EGD) WITH PROPOFOL N/A 04/27/2019   Procedure: ESOPHAGOGASTRODUODENOSCOPY (EGD) WITH PROPOFOL;  Surgeon: Wyline Mood, MD;  Location: [facility] ENDOSCOPY;  Service: Gastroenterology;  Laterality: N/A;   ilieostomy     PERCUTANEOUS NEPHROSTOLITHOTOMY     PERCUTANEOUS NEPHROSTOMY     VENTRICULOPERITONEAL SHUNT     vp shunt removal      Family History:  Family History  Problem Relation Age of Onset   Diabetes Mellitus II Father    Bladder Cancer Neg Hx    Kidney cancer Neg Hx    Prostate cancer Neg Hx     Social History:  reports that he has never smoked. He has never used smokeless tobacco. He reports that he does not drink alcohol and does not use drugs.  Allergies:  Allergies  Allergen Reactions  Morphine And Codeine Nausea And Vomiting   Metrizamide    Sulfa Antibiotics Other (See Comments)    Reaction: unknown   Ivp Dye [Iodinated Contrast Media] Hives   Latex Rash    Medications: I have reviewed the patient's current medications.  Labs: I have personally reviewed all labs for the past 24h  Imaging: I have personally reviewed and interpreted all imaging for the past 24h and agree with the radiologist's  impression.  DG Chest Port 1 View Result Date: 12/23/2023 CLINICAL DATA:  Possible sepsis. EXAM: PORTABLE CHEST 1 VIEW COMPARISON:  08/08/2023. FINDINGS: The heart size and mediastinal contours are within normal limits. No consolidation, effusion, or pneumothorax. The bony structures are stable. IMPRESSION: No active disease. Electronically Signed   By: Thornell Sartorius M.D.   On: 12/23/2023 18:58     Physical Exam Blood pressure (!) 87/46, pulse (!) 102, temperature 98.8 F (37.1 C), temperature source Axillary, resp. rate (!) 21, SpO2 100%. Constitutional: chronically ill appearing Oropharynx: mucous membranes dry, poor dentition CV: Sinus tachycardia, requiring pressors for normotension Chest: equal chest rise bilaterally Abdomen: soft, urostomy with yellow urine and colostomy with brown liquid stool. Back: Extensive malodorous sacral and gluteal wounds with necrotic tissue.  Skin: warm, dry, no rashes Neuro: A&Ox3, no sensation to sacrum,gluteal region    Assessment   [name] is an 58 y.o. male who presents to ED with extensive infected sacral/gluteal wounds and sepsis  Plan  - OR for debridement of sacrum and bilateral gluteal wounds - We discussed the risks of operation including but not limited to: bleeding, injury to surrounding structures, and high likelihood for multiple takebacks for debridement and/or dressing changes.. All questions were answered. Patient voiced understanding and agreed to proceed with surgery.   I reviewed ED provider notes, last 24 h vitals and pain scores, last 48 h intake and output, last 24 h labs and trends, last 24 h imaging results, and I discussed plan of care directly with PCCM physician .  This care required moderate level of medical decision making.   Donata Duff, MD [facility]

## 2023-12-23 NOTE — Progress Notes (Addendum)
 Patient to pre-op with one belongings bag.  Patient asked if his phone was in the bag.  Bag emptied in the presence of the patient and there was no phone.  Patient's phone located in blankets on stretcher in OR, placed in belongings bag.

## 2023-12-23 NOTE — H&P (Signed)
 NAME:  [name], MRN:  [id number], DOB:  19-May-1966, LOS: 0 ADMISSION DATE:  12/23/2023, CONSULTATION DATE:  12/23/2023 REFERRING MD:  Linwood Dibbles, MD, CHIEF COMPLAINT:  wound pain  History of Present Illness:  58 y.o. Caucasian male with medical history significant for hypertension, dyslipidemia, s/p urostomy, s/p colostomy, urolithiasis, spina bifida and neurogenic bladder, sacral decubitus ulcer, who presented to the emergency room with worsening sacral wounds.  He had similar episode 08/2023 and he does follow with [facility] and gets wound care.  Mostly bed bound, gets in wheelchair.  He lives with his sister who helps him with his ADLs. BP in the ED were low despite fluid challenges.  He was started on Levophed.  His LA was 3.3 and started on sepsis protocol and K+ was low at 2.4-getting replaced, WBC 16 and Cr 1.8.  He was given Ceftriaxone and Vancomycin in the ED. Pertinent  Medical History  hypertension,  dyslipidemia,  s/p urostomy,  s/p colostomy,  urolithiasis,  spina bifida,  neurogenic bladder,  sacral decubitus ulcer,   Significant Hospital Events: Including procedures, antibiotic start and stop dates in addition to other pertinent events   3/21: admit for sepsis secondary to UTI and sacral wounds  Interim History / Subjective:  N/a  Objective   Blood pressure (!) 87/46, pulse (!) 102, temperature 98.8 F (37.1 C), temperature source Axillary, resp. rate (!) 21, SpO2 100%.        Intake/Output Summary (Last 24 hours) at 12/23/2023 2040 Last data filed at 12/23/2023 1914 Gross per 24 hour  Intake --  Output 500 ml  Net -500 ml   There were no vitals filed for this visit.  Examination: General: alert awake NAD HENT: poor dentition, dry mm, supple neck no LN no JVD Lungs: CTA b/l no wheezes no rales Cardiovascular: reg s1s2 no murmurs no gallops Abdomen: soft nt nd , colostomy with brown liquidy stool and urostomy with clear urine Extremities: distorted,  no edema, no cyanosis Neuro: AAO x 3, CN II to XII grossly in tact GU: urostomy  Resolved Hospital Problem list   N/a  Assessment & Plan:  Sepsis  -Started on Sepsis protocol with IVF, antibiotics and now pressors  -Staring levophed 32mcg/min Hypotension  -Secondary to sepsis  -continue with IV fluids, pressors to wean as possible AKI  -From prerenal Azotemia secondary to sepsis and hypotension  -Avoidance of hypotension and caustic meds  -Monitor BUN/Cr UTI  -recurrent and will place on broad spectrum antibiotics  -Patient has a urostomy and colostomy  Sacral decubiti- stage III-IV  -Could a main source of his current sepsis   -Surgery to evaluate for need for debridement  Lactic Acidosis  -Secondary to Infection/hypotension  -Should improve with hydration, antibiotics and monitoring  Hypokalemia  -currently being replaced, follow levels Best Practice (right click and "Reselect all SmartList Selections" daily)   Diet/type: Regular consistency (see orders) DVT prophylaxis LMWH Pressure ulcer(s): present on admission  GI prophylaxis: H2B Lines: N/A Foley:  N/A Code Status:  full code   Labs   CBC: Recent Labs  Lab 12/23/23 1833 12/23/23 1839  WBC 16.0*  --   NEUTROABS 13.7*  --   HGB 8.2* 9.2*  HCT 28.5* 27.0*  MCV 73.8*  --   PLT 743*  --     Basic Metabolic Panel: Recent Labs  Lab 12/23/23 1833 12/23/23 1839  NA 132* 134*  K 2.4* 2.4*  CL 99 99  CO2 20*  --  GLUCOSE 151* 147*  BUN 27* 28*  CREATININE 1.71* 1.80*  CALCIUM 7.7*  --    GFR: CrCl cannot be calculated (Unknown ideal weight.). Recent Labs  Lab 12/23/23 1833 12/23/23 1840  WBC 16.0*  --   LATICACIDVEN  --  3.3*    Liver Function Tests: Recent Labs  Lab 12/23/23 1833  AST 43*  ALT 39  ALKPHOS 113  BILITOT 0.3  PROT 6.4*  ALBUMIN 1.6*   No results for input(s): "LIPASE", "AMYLASE" in the last 168 hours. No results for input(s): "AMMONIA" in the last 168  hours.  ABG    Component Value Date/Time   TCO2 23 12/23/2023 1839     Coagulation Profile: Recent Labs  Lab 12/23/23 1833  INR 1.2    Cardiac Enzymes: No results for input(s): "CKTOTAL", "CKMB", "CKMBINDEX", "TROPONINI" in the last 168 hours.  HbA1C: Hgb A1c MFr Bld  Date/Time Value Ref Range Status  04/20/2016 09:20 PM 5.6 4.0 - 6.0 % Final    CBG: No results for input(s): "GLUCAP" in the last 168 hours.  Review of Systems:   No fever/chills/sweats, no N/V/D, mild back/sacral pain, no chest or abd pain, no bleeding  Past Medical History:  He,  has a past medical history of Abdominal distention (10/11/2016), Acute lower UTI (12/22/2019), Acute sepsis (HCC) (04/21/2016), AKI (acute kidney injury) (HCC) (06/27/2020), Decubitus ulcer, Distal intestinal obstruction syndrome (HCC) (10/11/2016), Fecal impaction in rectum (HCC) (10/10/2016), Gastric outlet obstruction (06/03/2016), Gastritis, GERD (gastroesophageal reflux disease) (12/22/2019), HTN (hypertension) (06/27/2020), Hydronephrosis, Hyperlipidemia, Hypokalemia (10/11/2016), Impaction of the bowels [facility]), Kidney stone, Left ureteral stone (06/27/2020), Leukocytosis (10/11/2016), Loose stools (10/11/2016), Nausea & vomiting (06/03/2016), Renal disorder, SBO (small bowel obstruction) (HCC) (11/02/2017), Sepsis due to gram-negative UTI (HCC) (03/08/2021), Septic shock (HCC) (03/08/2021), SIRS (systemic inflammatory response syndrome) (HCC) (06/03/2016), Spina bifida (HCC), and Ulcer of esophagus with bleeding.   Surgical History:   Past Surgical History:  Procedure Laterality Date   ABDOMINAL SURGERY     APPENDECTOMY     BACK SURGERY     COLON SURGERY     CREATION / REVISION OF ILEOSTOMY / JEJUNOSTOMY  05/2015   CYSTOSCOPY WITH RETROGRADE PYELOGRAM, URETEROSCOPY AND STENT PLACEMENT  03/2014   ESOPHAGOGASTRODUODENOSCOPY (EGD) WITH PROPOFOL N/A 06/03/2016   Procedure: ESOPHAGOGASTRODUODENOSCOPY (EGD) WITH PROPOFOL;  Surgeon: Midge Minium, MD;   Location: [facility] ENDOSCOPY;  Service: Endoscopy;  Laterality: N/A;   ESOPHAGOGASTRODUODENOSCOPY (EGD) WITH PROPOFOL N/A 04/27/2019   Procedure: ESOPHAGOGASTRODUODENOSCOPY (EGD) WITH PROPOFOL;  Surgeon: Wyline Mood, MD;  Location: [facility] ENDOSCOPY;  Service: Gastroenterology;  Laterality: N/A;   ilieostomy     PERCUTANEOUS NEPHROSTOLITHOTOMY     PERCUTANEOUS NEPHROSTOMY     VENTRICULOPERITONEAL SHUNT     vp shunt removal       Social History:   reports that he has never smoked. He has never used smokeless tobacco. He reports that he does not drink alcohol and does not use drugs.   Family History:  His family history includes Diabetes Mellitus II in his father. There is no history of Bladder Cancer, Kidney cancer, or Prostate cancer.   Allergies Allergies  Allergen Reactions   Morphine And Codeine Nausea And Vomiting   Metrizamide    Sulfa Antibiotics Other (See Comments)    Reaction: unknown   Ivp Dye [Iodinated Contrast Media] Hives   Latex Rash     Home Medications  Prior to Admission medications   Medication Sig Start Date End Date Taking? Authorizing Provider  Cranberry-Vitamin C-Probiotic (AZO CRANBERRY  PO) Take 1 tablet by mouth daily.    [provider]  dicyclomine (BENTYL) 20 MG tablet Take 20 mg by mouth 2 (two) times daily.    [provider]  Multiple Vitamins-Minerals (CENTRUM MEN PO) Take 1 tablet by mouth daily.     [provider]  pantoprazole (PROTONIX) 40 MG tablet Take 1 tablet (40 mg total) by mouth daily. 04/29/19   Houston Siren, MD  potassium chloride SA (KLOR-CON) 20 MEQ tablet Take 20 mEq by mouth 3 (three) times daily.    [provider]  QUEtiapine (SEROQUEL) 25 MG tablet Take 25 mg by mouth at bedtime. 07/29/23 08/28/23  [provider]  rosuvastatin (CRESTOR) 10 MG tablet Take 10 mg by mouth at bedtime. 10/20/19   [provider]  sucralfate (CARAFATE) 1 g tablet Take 1 g by mouth 3 (three) times  daily. 12/14/19   [provider]  tiZANidine (ZANAFLEX) 4 MG tablet Take 4 mg by mouth every 8 (eight) hours as needed for muscle spasms. 07/29/23   [provider]     Critical care time: 38   The patient is critically ill with multiple organ system failure and requires high complexity decision making for assessment and support, frequent evaluation and titration of therapies, advanced monitoring, review of radiographic studies and interpretation of complex data.   Critical Care Time devoted to patient care services, exclusive of separately billable procedures, described in this note is 33 minutes.   Rozann Lesches, MD [facility] See Amion for pager  If no response to pager , please call [phone] until 7pm After 7:00 pm call Elink  [phone] 12/23/2023, 8:40 PM

## 2023-12-23 NOTE — Progress Notes (Signed)
 Pharmacy Antibiotic Note  [name] is a 58 y.o. male for which pharmacy has been consulted for vancomycin dosing for  sacral wound .  Patient with a history of HTN, dyslipidemia, urostomy, colostomy, spina bifida, neurogenic bladder, sacral decubitis ulcer. Patient presenting after wound care team recommended hospital evaluation.  SCr 1.8 - BL ~1.4-1.5 WBC 16; LA 3.3; T 98.8; HR 105; RR 22  Plan: Zosyn 3.375g IV q8h (4 hour infusion) Vancomycin 1000 mg q48hr (eAUC 559.4; estimated TBW of ~52kg) unless change in renal function Monitor WBC, fever, renal function, cultures De-escalate when able Levels at steady state     Temp (24hrs), Avg:98.8 F (37.1 C), Min:98.8 F (37.1 C), Max:98.8 F (37.1 C)  Recent Labs  Lab 12/23/23 1833 12/23/23 1839 12/23/23 1840  WBC 16.0*  --   --   CREATININE 1.71* 1.80*  --   LATICACIDVEN  --   --  3.3*    CrCl cannot be calculated (Unknown ideal weight.).    Allergies  Allergen Reactions   Morphine And Codeine Nausea And Vomiting   Metrizamide    Sulfa Antibiotics Other (See Comments)    Reaction: unknown   Ivp Dye [Iodinated Contrast Media] Hives   Latex Rash   Antimicrobials this admission: Rocephin x1 in ED 3/21  zosyn 3/21 >>  vancomycin 3/21 >>   Microbiology results: Pending  Thank you for allowing pharmacy to be a part of this patient's care.  Delmar Landau, PharmD, BCPS 12/23/2023 9:25 PM ED Clinical Pharmacist -  [phone]

## 2023-12-23 NOTE — Sepsis Progress Note (Signed)
 Sepsis protocol monitored by eLink ?

## 2023-12-23 NOTE — ED Triage Notes (Signed)
 Per EMS  Wound Sacrum Ongoing Worsening over last 2 weeks

## 2023-12-23 NOTE — Anesthesia Procedure Notes (Signed)
 Procedure Name: Intubation Date/Time: 12/23/2023 10:31 PM  Performed by: Laruth Bouchard., CRNAPre-anesthesia Checklist: Patient identified, Emergency Drugs available, Suction available, Patient being monitored and Timeout performed Patient Re-evaluated:Patient Re-evaluated prior to induction Oxygen Delivery Method: Circle system utilized Preoxygenation: Pre-oxygenation with 100% oxygen Induction Type: IV induction Ventilation: Mask ventilation without difficulty Laryngoscope Size: Mac and 3 Grade View: Grade I Tube type: Oral Tube size: 7.0 mm Number of attempts: 1 Airway Equipment and Method: Stylet Placement Confirmation: ETT inserted through vocal cords under direct vision, positive ETCO2 and breath sounds checked- equal and bilateral Secured at: 23 cm Tube secured with: Tape Dental Injury: Teeth and Oropharynx as per pre-operative assessment

## 2023-12-24 ENCOUNTER — Other Ambulatory Visit: Payer: Self-pay

## 2023-12-24 DIAGNOSIS — L89153 Pressure ulcer of sacral region, stage 3: Secondary | ICD-10-CM

## 2023-12-24 DIAGNOSIS — N179 Acute kidney failure, unspecified: Secondary | ICD-10-CM | POA: Diagnosis not present

## 2023-12-24 DIAGNOSIS — N390 Urinary tract infection, site not specified: Secondary | ICD-10-CM | POA: Diagnosis not present

## 2023-12-24 DIAGNOSIS — A419 Sepsis, unspecified organism: Secondary | ICD-10-CM

## 2023-12-24 DIAGNOSIS — E876 Hypokalemia: Secondary | ICD-10-CM

## 2023-12-24 DIAGNOSIS — E8729 Other acidosis: Secondary | ICD-10-CM

## 2023-12-24 LAB — MRSA NEXT GEN BY PCR, NASAL: MRSA by PCR Next Gen: DETECTED — AB

## 2023-12-24 LAB — CBC
HCT: 25.4 % — ABNORMAL LOW (ref 39.0–52.0)
HCT: 27.4 % — ABNORMAL LOW (ref 39.0–52.0)
Hemoglobin: 7.4 g/dL — ABNORMAL LOW (ref 13.0–17.0)
Hemoglobin: 8 g/dL — ABNORMAL LOW (ref 13.0–17.0)
MCH: 21 pg — ABNORMAL LOW (ref 26.0–34.0)
MCH: 21.4 pg — ABNORMAL LOW (ref 26.0–34.0)
MCHC: 29.1 g/dL — ABNORMAL LOW (ref 30.0–36.0)
MCHC: 29.2 g/dL — ABNORMAL LOW (ref 30.0–36.0)
MCV: 72.2 fL — ABNORMAL LOW (ref 80.0–100.0)
MCV: 73.3 fL — ABNORMAL LOW (ref 80.0–100.0)
Platelets: 654 10*3/uL — ABNORMAL HIGH (ref 150–400)
Platelets: 636 10*3/uL — ABNORMAL HIGH (ref 150–400)
RBC: 3.52 MIL/uL — ABNORMAL LOW (ref 4.22–5.81)
RBC: 3.74 MIL/uL — ABNORMAL LOW (ref 4.22–5.81)
RDW: 17.8 % — ABNORMAL HIGH (ref 11.5–15.5)
RDW: 17.9 % — ABNORMAL HIGH (ref 11.5–15.5)
WBC: 16.6 10*3/uL — ABNORMAL HIGH (ref 4.0–10.5)
WBC: 9.4 10*3/uL (ref 4.0–10.5)
nRBC: 0 % (ref 0.0–0.2)
nRBC: 0 % (ref 0.0–0.2)

## 2023-12-24 LAB — COMPREHENSIVE METABOLIC PANEL
ALT: 32 U/L (ref 0–44)
AST: 38 U/L (ref 15–41)
Albumin: <1.5 g/dL — ABNORMAL LOW (ref 3.5–5.0)
Alkaline Phosphatase: 96 U/L (ref 38–126)
Anion gap: 11 (ref 5–15)
BUN: 18 mg/dL (ref 6–20)
CO2: 20 mmol/L — ABNORMAL LOW (ref 22–32)
Calcium: 7.6 mg/dL — ABNORMAL LOW (ref 8.9–10.3)
Chloride: 102 mmol/L (ref 98–111)
Creatinine, Ser: 1.2 mg/dL (ref 0.61–1.24)
GFR, Estimated: >60 mL/min (ref >60)
Glucose, Bld: 203 mg/dL — ABNORMAL HIGH (ref 70–99)
Potassium: 3.1 mmol/L — ABNORMAL LOW (ref 3.5–5.1)
Sodium: 133 mmol/L — ABNORMAL LOW (ref 135–145)
Total Bilirubin: 0.3 mg/dL (ref 0.0–1.2)
Total Protein: 5.3 g/dL — ABNORMAL LOW (ref 6.5–8.1)

## 2023-12-24 LAB — GLUCOSE, CAPILLARY
Glucose-Capillary: 222 mg/dL — ABNORMAL HIGH (ref 70–99)
Glucose-Capillary: 218 mg/dL — ABNORMAL HIGH (ref 70–99)
Glucose-Capillary: 143 mg/dL — ABNORMAL HIGH (ref 70–99)
Glucose-Capillary: 160 mg/dL — ABNORMAL HIGH (ref 70–99)

## 2023-12-24 LAB — LACTIC ACID, PLASMA: Lactic Acid, Venous: 1.6 mmol/L (ref 0.5–1.9)

## 2023-12-24 LAB — HEMOGLOBIN A1C
Hgb A1c MFr Bld: 6.3 % — ABNORMAL HIGH (ref 4.8–5.6)
Mean Plasma Glucose: 134.11 mg/dL

## 2023-12-24 LAB — PHOSPHORUS: Phosphorus: 2.1 mg/dL — ABNORMAL LOW (ref 2.5–4.6)

## 2023-12-24 LAB — MAGNESIUM: Magnesium: 1.8 mg/dL (ref 1.7–2.4)

## 2023-12-24 MED ORDER — MAGNESIUM SULFATE 2 GM/50ML IV SOLN
2.0000 g | Freq: Once | INTRAVENOUS | Status: AC
  Administered 2023-12-24: 2 g via INTRAVENOUS
  Filled 2023-12-24: qty 50

## 2023-12-24 MED ORDER — ENSURE ENLIVE PO LIQD
237.0000 mL | Freq: Two times a day (BID) | ORAL | Status: DC
  Administered 2023-12-28: 237 mL via ORAL

## 2023-12-24 MED ORDER — ALBUMIN HUMAN 25 % IV SOLN
12.5000 g | Freq: Once | INTRAVENOUS | Status: AC
  Administered 2023-12-24: 12.5 g via INTRAVENOUS
  Filled 2023-12-24: qty 50

## 2023-12-24 MED ORDER — VANCOMYCIN HCL 1250 MG/250ML IV SOLN: 1250.0000 mg | INTRAVENOUS | Status: DC

## 2023-12-24 MED ORDER — CHLORHEXIDINE GLUCONATE CLOTH 2 % EX PADS: 6.0000 | MEDICATED_PAD | Freq: Once | CUTANEOUS | Status: DC

## 2023-12-24 MED ORDER — SODIUM CHLORIDE 0.9% FLUSH: 10.0000 mL | INTRAVENOUS | Status: DC | PRN

## 2023-12-24 MED ORDER — MIDODRINE HCL 5 MG PO TABS
10.0000 mg | ORAL_TABLET | Freq: Three times a day (TID) | ORAL | Status: DC
  Administered 2023-12-24 – 2023-12-25 (×3): 10 mg via ORAL
  Filled 2023-12-24 (×3): qty 2

## 2023-12-24 MED ORDER — HEMOSTATIC AGENTS (NO CHARGE) OPTIME
TOPICAL | Status: DC | PRN
  Administered 2023-12-23: 1 via TOPICAL

## 2023-12-24 MED ORDER — MUPIROCIN 2 % EX OINT
1.0000 | TOPICAL_OINTMENT | Freq: Two times a day (BID) | CUTANEOUS | Status: AC
  Administered 2023-12-24 – 2023-12-28 (×10): 1 via NASAL
  Filled 2023-12-24 (×2): qty 22

## 2023-12-24 MED ORDER — SODIUM CHLORIDE 0.9% FLUSH
10.0000 mL | Freq: Two times a day (BID) | INTRAVENOUS | Status: DC
  Administered 2023-12-24: 20 mL
  Administered 2023-12-25 – 2023-12-30 (×11): 10 mL

## 2023-12-24 MED ORDER — POTASSIUM PHOSPHATES 15 MMOLE/5ML IV SOLN
30.0000 mmol | Freq: Once | INTRAVENOUS | Status: AC
  Administered 2023-12-24: 30 mmol via INTRAVENOUS
  Filled 2023-12-24: qty 10

## 2023-12-24 MED ORDER — POTASSIUM CHLORIDE CRYS ER 20 MEQ PO TBCR
40.0000 meq | EXTENDED_RELEASE_TABLET | Freq: Once | ORAL | Status: AC
  Administered 2023-12-24: 40 meq via ORAL
  Filled 2023-12-24: qty 2

## 2023-12-24 MED ORDER — LACTATED RINGERS IV BOLUS
1000.0000 mL | Freq: Once | INTRAVENOUS | Status: AC
  Administered 2023-12-24: 1000 mL via INTRAVENOUS

## 2023-12-24 MED ORDER — VANCOMYCIN HCL IN DEXTROSE 1-5 GM/200ML-% IV SOLN: 1000.0000 mg | INTRAVENOUS | Status: DC

## 2023-12-24 NOTE — Progress Notes (Signed)
 Peripherally Inserted Central Catheter Placement  The IV Nurse has discussed with the patient and/or persons authorized to consent for the patient, the purpose of this procedure and the potential benefits and risks involved with this procedure.  The benefits include less needle sticks, lab draws from the catheter, and the patient may be discharged home with the catheter. Risks include, but not limited to, infection, bleeding, blood clot (thrombus formation), and puncture of an artery; nerve damage and irregular heartbeat and possibility to perform a PICC exchange if needed/ordered by physician.  Alternatives to this procedure were also discussed.  Bard Power PICC patient education guide, fact sheet on infection prevention and patient information card has been provided to patient /or left at bedside.    PICC Placement Documentation  PICC Double Lumen 12/24/23 Right Brachial 36 cm 0 cm (Active)  Indication for Insertion or Continuance of Line Vasoactive infusions 12/24/23 1605  Exposed Catheter (cm) 0 cm 12/24/23 1605  Site Assessment Clean, Dry, Intact 12/24/23 1605  Lumen #1 Status Flushed;Saline locked;Blood return noted 12/24/23 1605  Lumen #2 Status Flushed;Saline locked;Blood return noted 12/24/23 1605  Dressing Type Transparent;Securing device 12/24/23 1605  Dressing Status Antimicrobial disc/dressing in place 12/24/23 1605  Line Care Connections checked and tightened 12/24/23 1605  Line Adjustment (NICU/IV Team Only) No 12/24/23 1605  Dressing Intervention New dressing;Adhesive placed at insertion site (IV team only) 12/24/23 1605  Dressing Change Due 12/31/23 12/24/23 1605       Vernona Rieger  Coreena Rubalcava 12/24/2023, 4:07 PM

## 2023-12-24 NOTE — Progress Notes (Signed)
 Patient arrived to 80M-03 from OR via stretcher. Alert and oriented X4. Room air. 22g LH, 22g LFA, and 14g LEJ, C/D/I, flushed and infusing Levo @8  mcg's. Patient has urostomy and colostomy. Urostomy hooked to drainage bag. See OR's note about wounds and measurements. WOC consult placed and CCS to change dressing in 24 hrs. With nursing just reinforcing. Patient refused SCD's. One belonging bag at bedside, with shoes, clothing, cell phone and charger, and eyeglasses within reach. Bed lowered, locked, and call bell within reach. Patient has no complaints at this time.

## 2023-12-24 NOTE — Progress Notes (Signed)
 Attempted x 2 to obtain telephone consent from sister with no success. RN made aware.

## 2023-12-24 NOTE — Progress Notes (Signed)
 [facility] Progress Note  1 Day Post-Op  Subjective: CC:  Alert, just ate 100% of breakfast. Pain controlled.   Objective: Vital signs in last 24 hours: Temp:  [97.4 F (36.3 C)-98.8 F (37.1 C)] 97.4 F (36.3 C) (03/22 0718) Pulse Rate:  [58-117] 66 (03/22 0900) Resp:  [12-26] 16 (03/22 0900) BP: (71-116)/(40-90) 93/52 (03/22 0900) SpO2:  [94 %-100 %] 100 % (03/22 0900) Weight:  [47.6 kg] 47.6 kg (03/22 0456) Last BM Date : 12/23/23  Intake/Output from previous day: 03/21 0701 - 03/22 0700 In: 3263.2 [I.V.:2041.2; IV Piggyback:1222] Out: 1400 [Urine:1300; Blood:100] Intake/Output this shift: Total I/O In: 373.3 [I.V.:235.7; IV Piggyback:137.6] Out: -   PE: Gen:  Alert, NAD, pleasant Card:  Regular rate and rhythm Pulm:  Normal effort on room air Abd: Soft, colostomy left hemiabdomen productive Skin: warm and dry GU: not examined Psych: A&Ox3   Lab Results:  Recent Labs    12/23/23 1833 12/23/23 1839 12/24/23 0243  WBC 16.0*  --  16.6*  HGB 8.2* 9.2* 7.4*  HCT 28.5* 27.0* 25.4*  PLT 743*  --  654*   BMET Recent Labs    12/23/23 1833 12/23/23 1839 12/24/23 0243  NA 132* 134* 133*  K 2.4* 2.4* 3.1*  CL 99 99 102  CO2 20*  --  20*  GLUCOSE 151* 147* 203*  BUN 27* 28* 18  CREATININE 1.71* 1.80* 1.20  CALCIUM 7.7*  --  7.6*   PT/INR Recent Labs    12/23/23 1833  LABPROT 15.4*  INR 1.2   CMP     Component Value Date/Time   NA 133 (L) 12/24/2023 0243   K 3.1 (L) 12/24/2023 0243   CL 102 12/24/2023 0243   CO2 20 (L) 12/24/2023 0243   GLUCOSE 203 (H) 12/24/2023 0243   BUN 18 12/24/2023 0243   CREATININE 1.20 12/24/2023 0243   CALCIUM 7.6 (L) 12/24/2023 0243   PROT 5.3 (L) 12/24/2023 0243   ALBUMIN <1.5 (L) 12/24/2023 0243   AST 38 12/24/2023 0243   ALT 32 12/24/2023 0243   ALKPHOS 96 12/24/2023 0243   BILITOT 0.3 12/24/2023 0243   GFRNONAA >60 12/24/2023 0243   GFRAA >60 06/30/2020 0527   Lipase     Component Value  Date/Time   LIPASE 34 03/07/2021 2159       Studies/Results: [place] EKG SITE RITE Result Date: 12/24/2023 If Site Rite image not attached, placement could not be confirmed due to current cardiac rhythm.  CT PELVIS WO CONTRAST Result Date: 12/23/2023 CLINICAL DATA:  Large sacral ulcer, assess for possible deep space infection. EXAM: CT PELVIS WITHOUT CONTRAST TECHNIQUE: Multidetector CT imaging of the pelvis was performed following the standard protocol without intravenous contrast. RADIATION DOSE REDUCTION: This exam was performed according to the departmental dose-optimization program which includes automated exposure control, adjustment of the mA and/or kV according to patient size and/or use of iterative reconstruction technique. COMPARISON:  08/08/2023. FINDINGS: Urinary Tract: No abnormality is visualized. The urinary bladder is nondistended. Bowel: A right lower quadrant ostomy is noted. No acute abnormality is seen. Vascular/Lymphatic: Prominent lymph nodes are present along the iliac chain bilaterally, likely reactive. Significant vascular abnormality is seen. Reproductive:  The prostate gland is within normal limits. Other: There is diastasis of the rectus abdominus with a broad-based hernia. No abdominopelvic ascites. Pelvic floor laxity is noted. Musculoskeletal: Decubitus ulcerations are noted at the ischial tuberosities bilaterally. There is debris or packing material in the left decubitus ulceration. Ulcerations extend into  the inferior pubic rami regions bilaterally. Soft tissue thickening and small joint effusion with air are noted at the left hip joint hip there is chronic bony deformity of the inferior aspect of the sacrum. Stable bony deformity with erosions are noted at the ischial tuberosity on the right and inferior pubic rami bilaterally. Increased erosions and bony destruction are noted along the posterior aspect of the ischium on the left. IMPRESSION: 1. Chronic left decubitus ulcer  with worsening erosions involving the ischium and inferior pubic ramus and extension into the left hip, concerning for acute on chronic osteomyelitis. 2. Chronic ischial decubitus ulcer on the right with stable bony erosions, likely reflecting chronic osteomyelitis. Electronically Signed   By: Thornell Sartorius M.D.   On: 12/23/2023 21:01   DG Chest Port 1 View Result Date: 12/23/2023 CLINICAL DATA:  Possible sepsis. EXAM: PORTABLE CHEST 1 VIEW COMPARISON:  08/08/2023. FINDINGS: The heart size and mediastinal contours are within normal limits. No consolidation, effusion, or pneumothorax. The bony structures are stable. IMPRESSION: No active disease. Electronically Signed   By: Thornell Sartorius M.D.   On: 12/23/2023 18:58    Anti-infectives: Anti-infectives (From admission, onward)    Start     Dose/Rate Route Frequency Ordered Stop   12/25/23 1900  vancomycin (VANCOCIN) IVPB 1000 mg/200 mL premix        1,000 mg 200 mL/hr over 60 Minutes Intravenous Every 48 hours 12/23/23 2206     12/23/23 2200  piperacillin-tazobactam (ZOSYN) IVPB 3.375 g  Status:  Discontinued        3.375 g 100 mL/hr over 30 Minutes Intravenous Every 8 hours 12/23/23 2107 12/23/23 2123   12/23/23 2200  piperacillin-tazobactam (ZOSYN) IVPB 3.375 g        3.375 g 12.5 mL/hr over 240 Minutes Intravenous Every 8 hours 12/23/23 2123     12/23/23 1800  cefTRIAXone (ROCEPHIN) 2 g in sodium chloride 0.9 % 100 mL IVPB        2 g 200 mL/hr over 30 Minutes Intravenous Once 12/23/23 1759 12/23/23 1854   12/23/23 1800  vancomycin (VANCOCIN) IVPB 1000 mg/200 mL premix        1,000 mg 200 mL/hr over 60 Minutes Intravenous  Once 12/23/23 1759 12/23/23 2018        Assessment/Plan acute on chronic infected sacral and gluteal wounds, osteomyelitis   S/P IRRIGATION AND DEBRIDEMENT OF NECROTIZING SOFT TISSUE INFECTION 3/22 Dr. Azucena Cecil - POD#0  - afebrile, lactic acidosis resolved, WBC stable 16 - Wound Cx NGTD - CCM weaning pressors -  will plain bedside dressing change tomorrow morning 3/23, RN staff may change top dressing/ABD pads but do not need to change packing today.   FEN: Reg diet ID: Zosyn, Vanc VTE: SQH Foley: none, urostomy Dispo: ICU  Spinda bifida AKI UTI, chronic urostomy    LOS: 1 day   I reviewed nursing notes, hospitalist notes, last 24 h vitals and pain scores, last 48 h intake and output, last 24 h labs and trends, and last 24 h imaging results.  This care required straight-forward level of medical decision making.   Hosie Spangle, PA-C [facility] Please see Amion for pager number during day hours 7:00am-4:30pm

## 2023-12-24 NOTE — Transfer of Care (Signed)
 Immediate Anesthesia Transfer of Care Note  Patient: [name]  Procedure(s) Performed: IRRIGATION AND DEBRIDEMENT OF NECROTIZING SOFT TISSUE INFECTION (Bilateral)  Patient Location: PACU  Anesthesia Type:General  Level of Consciousness: awake, alert , and oriented  Airway & Oxygen Therapy: Patient Spontanous Breathing and Patient connected to nasal cannula oxygen  Post-op Assessment: Report given to RN and Post -op Vital signs reviewed and stable  Post vital signs: Reviewed and stable  Last Vitals:  Vitals Value Taken Time  BP 116/70 12/24/23 0000  Temp    Pulse 77 12/24/23 0004  Resp 20 12/24/23 0004  SpO2 98 % 12/24/23 0004  Vitals shown include unfiled device data.  Last Pain:  Vitals:   12/23/23 1713  TempSrc:   PainSc: 0-No pain         Complications: No notable events documented.

## 2023-12-24 NOTE — Progress Notes (Addendum)
 NAME:  [name], MRN:  [id number], DOB:  1966/05/12, LOS: 1 ADMISSION DATE:  12/23/2023, CONSULTATION DATE:  12/23/2023 REFERRING MD:  Linwood Dibbles, MD, CHIEF COMPLAINT:  wound pain  History of Present Illness:  58 y.o. Caucasian male with medical history significant for hypertension, dyslipidemia, s/p urostomy, s/p colostomy, urolithiasis, spina bifida and neurogenic bladder, sacral decubitus ulcer, who presented to the emergency room with worsening sacral wounds.  He had similar episode 08/2023 and he does follow with PA Stone and gets wound care.  Mostly bed bound, gets in wheelchair.  He lives with his sister who helps him with his ADLs. BP in the ED were low despite fluid challenges.  He was started on Levophed.  His LA was 3.3 and started on sepsis protocol and K+ was low at 2.4-getting replaced, WBC 16 and Cr 1.8.  He was given Ceftriaxone and Vancomycin in the ED. Pertinent  Medical History  hypertension,  dyslipidemia,  s/p urostomy,  s/p colostomy,  urolithiasis,  spina bifida,  neurogenic bladder,  sacral decubitus ulcer,   Significant Hospital Events: Including procedures, antibiotic start and stop dates in addition to other pertinent events   3/21: admit for sepsis secondary to UTI and sacral wounds.  Started on broad-spectrum antibiotics.  Surgical team consulted for necrotizing soft tissue infection underwent irrigation and debridement in the OR early morning 3/22  Interim History / Subjective:  N/a  Objective   Blood pressure (!) 98/54, pulse 60, temperature (!) 97.4 F (36.3 C), temperature source Axillary, resp. rate 16, height 4\' 6"  (1.372 m), weight 47.6 kg, SpO2 98%.        Intake/Output Summary (Last 24 hours) at 12/24/2023 0739 Last data filed at 12/24/2023 0600 Gross per 24 hour  Intake 3037 ml  Output 1400 ml  Net 1637 ml   Filed Weights   12/24/23 0456  Weight: 47.6 kg    Examination: General: 58 year old male patient lying in bed no acute  distress this morning HEENT normocephalic atraumatic no jugular venous distention appreciated mucous membranes are moist Pulmonary: Clear to auscultation currently room air no accessory use Cardiac: Regular rate and rhythm no murmur rub or gallop Abdomen soft, left colostomy unremarkable, with stool noted, urostomy with clear yellow urine Neuro awake oriented x 3 appropriate moving upper extremities, lower extremities contracted Sacrum, dressing intact Resolved Hospital Problem list   N/a  Assessment & Plan:  Septic shock with lactic acidosis secondary to necrotizing soft tissue infection of chronic sacral wound stage III-IV.  Status post irrigation and debridement in the operating room 3/22 Lactate has cleared  plan Wound care as outlined by surgical team WOC to also follow  Day 2 zosyn and vanc Wean norepinephrine for systolic blood pressure greater than 90 Add midodrine Keep euvolemic   AKI -From prerenal Azotemia secondary to sepsis and hypotension, improved  Plan Renal dose meds Strict I&O Avoid hypotension   UTI w/ chronic urostomy  plan Abx as above  F/u cultures  Fluid and Electrolyte imbalance: Hypokalemia, NAGMA, hypophosphatemia Plan Replace and recheck Cont gentle balanced fluid replacement w/ LR   Acute on chronic anemia. Suspect some degree surgical blood loss and some degree dilutional effect from resuscitation but 8.2-->7.4 Plan Repeat hgb little later today  May need x fusion if < 7  Hyperglycemia Plan Ssi Goal 140-180  Best Practice (right click and "Reselect all SmartList Selections" daily)   Diet/type: Regular consistency (see orders) DVT prophylaxis prophylactic heparin  Pressure ulcer(s): present on admission  GI  prophylaxis: H2B Lines: N/A Foley:  N/A Code Status:  full code

## 2023-12-24 NOTE — Progress Notes (Signed)
 eLink Physician-Brief Progress Note Patient Name: [name] DOB: 03/22/1966 MRN: [id number]   Date of Service  12/24/2023  HPI/Events of Note  58 year old male who is bedbound presented to the emergency department with worsening of his sacral wounds.  He has a history of spina bifida and neurogenic bladder and is status post urostomy and diverting colostomy.  He also has sacral wounds.  His wounds developed a foul-smelling odor and wound care advised him to come to the emergency department.  He was found to be septic with necrotizing skin and soft tissue infection.  He had lactic acidosis and worsening acute kidney injury.  Treatment was started and patient was sent to the operating room for irrigation and debridement of necrotizing sacral skin and soft tissue infections.  eICU Interventions  Patient's chart reviewed.  Pertinent labs and imaging studies reviewed.  Video assessment of patient done. Impression: Necrotizing skin and soft tissue infection Severe sepsis with shock AKI Lactic acidosis Pyuria  He is on appropriate IV antibiotics Fluids and pressors until hemodynamically stable AKI and lactic acidosis should resolve with appropriate hydration Unclear if his pyuria represents a UTI given the fact that he has a suprapubic catheter.      Intervention Category Evaluation Type: New Patient Evaluation  Carilyn Goodpasture 12/24/2023, 1:57 AM

## 2023-12-24 NOTE — Op Note (Signed)
 IRRIGATION AND DEBRIDEMENT OF NECROTIZING SOFT TISSUE INFECTION  Operative Note (CSN: [id number])  Service  Date of Surgery: 12/23/2023 Admit Date: 12/23/2023 Performing Service: General Surgeons and Role:    Azucena Cecil, Benetta Spar, MD - Primary  Op Note Pre-op Diagnosis: acute on chronic infected sacral and gluteal wounds, osteomyelitis Post-op Diagnosis: same  Procedure(s): IRRIGATION AND DEBRIDEMENT OF NECROTIZING SOFT TISSUE INFECTION  Findings: Necrotic tissue to bilateral buttocks involving skin, soft tissue, muscle with extension to exposed bone. Most of infection appeared acute on chronic. Small area anterior extending into posterior scrotum with soupy fluid more concerning for possible evolving necrotizing infection. Tissue debrided back to viable bleeding fat except for areas involving bone.  Anesthesia: General Estimated Blood Loss: 100 mL  Complications: None Specimens:  ID Type Source Tests Collected by Time Destination  1 : GLUTEAL TISSUE Tissue PATH Soft tissue resection SURGICAL PATHOLOGY, AEROBIC/ANAEROBIC CULTURE W GRAM STAIN (SURGICAL/DEEP WOUND) Lysle Rubens, MD 12/23/2023 2307     Brief history / Indications for Surgery: Chronic sacral/gluteal wounds now infected and causing sepsis.  Procedure Details  Prior to the procedure, the risks, benefits, complications, treatment options, and expected outcomes were discussed with the patient and/or family, including but not limited to, the risks of bleeding, infection, and possible need for return to OR for further debridement or dressing change.  Despite the risks, the patient has given informed consent for operative intervention.  The patient was taken to the Operating Room, identified as [name] and the procedure verified as incision and drainage of sacrum and bilateral buttocks.  Identification pause was held and the above information confirmed.  The patient was placed in the prone position and General  endotracheal anesthesia was induced. The sacrum, buttocks and perineum were prepped with betadine and draped in the typical sterile fashion.  A formal preincision time out was performed.  Using electrocautery, all necrotic tissue of bilateral buttocks was debrided back to viable bleeding tissue. Most of this appeared chronically infected. There was small areas of bone exposed bilaterally. Area of necrosis with concerning for possible aggressive necrotizing infection in posterior scrotum was debrided back to healthy tissue. There was some bleeding of periosteum of exposed bone. The wound was irrigated and hemostasis achieved with electrocautery and application of arista to exposed bleeding bone. Wound packed with saline soaked gauze x 2 (1 per side). Wound bed measured 8x7x2cm on right buttocks and 12x4x3 on left buttocks extending to very posterior scrotum.  Instrument, sponge, and needle counts were correct at the conclusion of the case.   Post-op Plan: - Sister called and updated - Ok for diet - WOCN consult - Can reinforce dressing. We will change dressing in 24h at bedside if able to tolerate, otherwise may need to return to OR for dressing change. - Follow up OR tissue cultures   Lysle Rubens, MD Date: 12/24/2023  Time: 12:03 AM

## 2023-12-25 DIAGNOSIS — A419 Sepsis, unspecified organism: Secondary | ICD-10-CM | POA: Diagnosis not present

## 2023-12-25 DIAGNOSIS — N179 Acute kidney failure, unspecified: Secondary | ICD-10-CM | POA: Diagnosis not present

## 2023-12-25 DIAGNOSIS — L89153 Pressure ulcer of sacral region, stage 3: Secondary | ICD-10-CM | POA: Diagnosis not present

## 2023-12-25 DIAGNOSIS — N390 Urinary tract infection, site not specified: Secondary | ICD-10-CM | POA: Diagnosis not present

## 2023-12-25 LAB — COMPREHENSIVE METABOLIC PANEL
ALT: 24 U/L (ref 0–44)
AST: 26 U/L (ref 15–41)
Albumin: <1.5 g/dL — ABNORMAL LOW (ref 3.5–5.0)
Alkaline Phosphatase: 72 U/L (ref 38–126)
Anion gap: 9 (ref 5–15)
BUN: 15 mg/dL (ref 6–20)
CO2: 24 mmol/L (ref 22–32)
Calcium: 7.3 mg/dL — ABNORMAL LOW (ref 8.9–10.3)
Chloride: 102 mmol/L (ref 98–111)
Creatinine, Ser: 1.08 mg/dL (ref 0.61–1.24)
GFR, Estimated: >60 mL/min (ref >60)
Glucose, Bld: 122 mg/dL — ABNORMAL HIGH (ref 70–99)
Potassium: 2.9 mmol/L — ABNORMAL LOW (ref 3.5–5.1)
Sodium: 135 mmol/L (ref 135–145)
Total Bilirubin: 0.2 mg/dL (ref 0.0–1.2)
Total Protein: 4.9 g/dL — ABNORMAL LOW (ref 6.5–8.1)

## 2023-12-25 LAB — CBC
HCT: 21.1 % — ABNORMAL LOW (ref 39.0–52.0)
HCT: 21.6 % — ABNORMAL LOW (ref 39.0–52.0)
Hemoglobin: 6.3 g/dL — CL (ref 13.0–17.0)
Hemoglobin: 6.4 g/dL — CL (ref 13.0–17.0)
MCH: 21.6 pg — ABNORMAL LOW (ref 26.0–34.0)
MCH: 21.6 pg — ABNORMAL LOW (ref 26.0–34.0)
MCHC: 29.9 g/dL — ABNORMAL LOW (ref 30.0–36.0)
MCHC: 29.6 g/dL — ABNORMAL LOW (ref 30.0–36.0)
MCV: 72.3 fL — ABNORMAL LOW (ref 80.0–100.0)
MCV: 73 fL — ABNORMAL LOW (ref 80.0–100.0)
Platelets: 635 10*3/uL — ABNORMAL HIGH (ref 150–400)
Platelets: 664 10*3/uL — ABNORMAL HIGH (ref 150–400)
RBC: 2.92 MIL/uL — ABNORMAL LOW (ref 4.22–5.81)
RBC: 2.96 MIL/uL — ABNORMAL LOW (ref 4.22–5.81)
RDW: 18.1 % — ABNORMAL HIGH (ref 11.5–15.5)
RDW: 18.1 % — ABNORMAL HIGH (ref 11.5–15.5)
WBC: 13.6 10*3/uL — ABNORMAL HIGH (ref 4.0–10.5)
WBC: 15 10*3/uL — ABNORMAL HIGH (ref 4.0–10.5)
nRBC: 0 % (ref 0.0–0.2)
nRBC: 0 % (ref 0.0–0.2)

## 2023-12-25 LAB — PREPARE RBC (CROSSMATCH)

## 2023-12-25 LAB — HEMOGLOBIN AND HEMATOCRIT, BLOOD
HCT: 27.4 % — ABNORMAL LOW (ref 39.0–52.0)
HCT: 28 % — ABNORMAL LOW (ref 39.0–52.0)
HCT: 28 % — ABNORMAL LOW (ref 39.0–52.0)
Hemoglobin: 8.5 g/dL — ABNORMAL LOW (ref 13.0–17.0)
Hemoglobin: 8.6 g/dL — ABNORMAL LOW (ref 13.0–17.0)
Hemoglobin: 8.4 g/dL — ABNORMAL LOW (ref 13.0–17.0)

## 2023-12-25 LAB — GLUCOSE, CAPILLARY
Glucose-Capillary: 102 mg/dL — ABNORMAL HIGH (ref 70–99)
Glucose-Capillary: 81 mg/dL (ref 70–99)
Glucose-Capillary: 92 mg/dL (ref 70–99)
Glucose-Capillary: 83 mg/dL (ref 70–99)
Glucose-Capillary: 75 mg/dL (ref 70–99)
Glucose-Capillary: 95 mg/dL (ref 70–99)

## 2023-12-25 LAB — PHOSPHORUS: Phosphorus: 3.5 mg/dL (ref 2.5–4.6)

## 2023-12-25 LAB — URINE CULTURE

## 2023-12-25 LAB — MAGNESIUM: Magnesium: 2.3 mg/dL (ref 1.7–2.4)

## 2023-12-25 MED ORDER — QUETIAPINE FUMARATE 25 MG PO TABS
25.0000 mg | ORAL_TABLET | Freq: Every day | ORAL | Status: DC
Start: 2023-12-25 — End: 2023-12-30
  Administered 2023-12-25 – 2023-12-29 (×5): 25 mg via ORAL
  Filled 2023-12-25 (×5): qty 1

## 2023-12-25 MED ORDER — POTASSIUM CHLORIDE CRYS ER 20 MEQ PO TBCR
40.0000 meq | EXTENDED_RELEASE_TABLET | Freq: Once | ORAL | Status: AC
  Administered 2023-12-25: 40 meq via ORAL
  Filled 2023-12-25: qty 2

## 2023-12-25 MED ORDER — MIDODRINE HCL 5 MG PO TABS
15.0000 mg | ORAL_TABLET | Freq: Three times a day (TID) | ORAL | Status: DC
  Administered 2023-12-25 – 2023-12-28 (×11): 15 mg via ORAL
  Filled 2023-12-25 (×11): qty 3

## 2023-12-25 MED ORDER — VANCOMYCIN HCL 1250 MG/250ML IV SOLN
1250.0000 mg | INTRAVENOUS | Status: DC
  Administered 2023-12-25: 1250 mg via INTRAVENOUS
  Filled 2023-12-25: qty 250

## 2023-12-25 MED ORDER — POTASSIUM CHLORIDE 20 MEQ PO PACK: 40.0000 meq | PACK | ORAL | Status: DC

## 2023-12-25 MED ORDER — POTASSIUM CHLORIDE 10 MEQ/50ML IV SOLN
10.0000 meq | INTRAVENOUS | Status: AC
  Administered 2023-12-25 (×4): 10 meq via INTRAVENOUS
  Filled 2023-12-25 (×4): qty 50

## 2023-12-25 MED ORDER — SIMETHICONE 80 MG PO CHEW
80.0000 mg | CHEWABLE_TABLET | Freq: Four times a day (QID) | ORAL | Status: DC | PRN
  Administered 2023-12-25: 80 mg via ORAL
  Filled 2023-12-25 (×2): qty 1

## 2023-12-25 MED ORDER — SODIUM CHLORIDE 0.9% IV SOLUTION: Freq: Once | INTRAVENOUS | Status: DC

## 2023-12-25 NOTE — Progress Notes (Signed)
 NAME:  [name], MRN:  [id number], DOB:  1965-12-14, LOS: 2 ADMISSION DATE:  12/23/2023, CONSULTATION DATE:  12/23/2023 REFERRING MD:  Linwood Dibbles, MD, CHIEF COMPLAINT:  wound pain  History of Present Illness:  58 y.o. Caucasian male with medical history significant for hypertension, dyslipidemia, s/p urostomy, s/p colostomy, urolithiasis, spina bifida and neurogenic bladder, sacral decubitus ulcer, who presented to the emergency room with worsening sacral wounds.  He had similar episode 08/2023 and he does follow with PA Stone and gets wound care.  Mostly bed bound, gets in wheelchair.  He lives with his sister who helps him with his ADLs. BP in the ED were low despite fluid challenges.  He was started on Levophed.  His LA was 3.3 and started on sepsis protocol and K+ was low at 2.4-getting replaced, WBC 16 and Cr 1.8.  He was given Ceftriaxone and Vancomycin in the ED. Pertinent  Medical History  hypertension,  dyslipidemia,  s/p urostomy,  s/p colostomy,  urolithiasis,  spina bifida,  neurogenic bladder,  sacral decubitus ulcer,   Significant Hospital Events: Including procedures, antibiotic start and stop dates in addition to other pertinent events   3/21: admit for sepsis secondary to UTI and sacral wounds.  Started on broad-spectrum antibiotics.  Surgical team consulted for necrotizing soft tissue infection underwent irrigation and debridement in the OR early morning 3/22 3/23 weaning norepinephrine for systolic blood pressure greater than 90, renal function normalized, nonanion gap metabolic acidosis resolved, getting potassium.  Increasing midodrine, getting 1 unit of blood  Interim History / Subjective:  Quiet night, wants to get out of the ICU  Objective   Blood pressure (!) 101/57, pulse (!) 57, temperature 97.7 F (36.5 C), temperature source Axillary, resp. rate 13, height 4\' 6"  (1.372 m), weight 48.5 kg, SpO2 98%.        Intake/Output Summary (Last 24 hours) at  12/25/2023 4098 Last data filed at 12/25/2023 0600 Gross per 24 hour  Intake 3423.59 ml  Output 2700 ml  Net 723.59 ml   Filed Weights   12/24/23 0456 12/25/23 0500  Weight: 47.6 kg 48.5 kg    Examination: General 58 year old male patient currently resting in bed no acute distress eating breakfast HEENT normocephalic atraumatic no jugular venous distention Pulmonary clear to auscultation currently room air no accessory use Cardiac regular rate and rhythm Wound soft not tender Neuro intact with chronic lower extremity contractures and immobility Skin sacral dressing intact GU clear yellow via ostomy  Resolved Hospital Problem list   Nonanion gap metabolic acidosis Acute kidney injury Assessment & Plan:  Septic shock with lactic acidosis secondary to necrotizing soft tissue infection of chronic sacral wound stage III-IV.  Status post irrigation and debridement in the operating room 3/22 Lactate has cleared  plan Wound care as outlined by surgical team WOC to also follow  Day 3 zosyn and vanc Wean norepinephrine for systolic blood pressure greater than 90, just discontinued now Added midodrine, will inc to 15mg  tid  Keep euvolemic I think the unit of blood will help  UTI w/ chronic urostomy  plan Abx as above  F/u cultures, still pending  Fluid and Electrolyte imbalance: Hypokalemia Plan Replace and recheck Normal saline lock IV fluids after blood  Acute on chronic anemia. Suspect some degree surgical blood loss and some degree dilutional effect from resuscitation but 8.2-->7.4-->6.3 Plan Getting a unit of blood Follow-up posttransfusion CBC For now I think it is okay to continue the subcutaneous heparin  Hyperglycemia Plan Ssi,  ACHS Goal 140-180  Best Practice (right click and "Reselect all SmartList Selections" daily)   Diet/type: Regular consistency (see orders) DVT prophylaxis prophylactic heparin  Pressure ulcer(s): present on admission  GI prophylaxis:  H2B Lines: N/A Foley:  N/A Code Status:  full code  My cct 32 min

## 2023-12-25 NOTE — Progress Notes (Signed)
 Pharmacy Antibiotic Note  [name] is a 58 y.o. male for which pharmacy has been consulted for vancomycin dosing for  sacral wound .  Patient with a history of HTN, dyslipidemia, urostomy, colostomy, spina bifida, neurogenic bladder, sacral decubitis ulcer. Patient presenting after wound care team recommended hospital evaluation.  SCr improved at 1.08 with great UOP (2.3 mL/kg/hr).  WBC slow trend down; LA now within normal limtis Afebrile, HR 70s, Hypotensive on Levophed at 2 + Midodrine.   Plan: Continue Zosyn 3.375g IV q8h (4 hour infusion) Increase Vancomycin to 1250 mg q48hr (eAUC 485; SCr used 1.08)  Monitor WBC, fever, renal function, cultures De-escalate when able Levels at steady state  Height: 4\' 6"  (137.2 cm) Weight: 48.5 kg (106 lb 14.8 oz) IBW/kg (Calculated) : 36.2  Temp (24hrs), Avg:97.9 F (36.6 C), Min:97.1 F (36.2 C), Max:98.8 F (37.1 C)  Recent Labs  Lab 12/23/23 1833 12/23/23 1839 12/23/23 1840 12/24/23 0243 12/24/23 1116 12/25/23 0330 12/25/23 0400  WBC 16.0*  --   --  16.6* 9.4 13.6* 15.0*  CREATININE 1.71* 1.80*  --  1.20  --  1.08  --   LATICACIDVEN  --   --  3.3* 1.6  --   --   --     Estimated Creatinine Clearance: 44.4 mL/min (by C-G formula based on SCr of 1.08 mg/dL).    Allergies  Allergen Reactions   Morphine And Codeine Nausea And Vomiting   Metrizamide    Sulfa Antibiotics Other (See Comments)    Reaction: unknown   Ivp Dye [Iodinated Contrast Media] Hives   Latex Rash   Antimicrobials this admission: Rocephin x1 in ED 3/21  zosyn 3/21 >>  vancomycin 3/21 >>   Microbiology results: Pending  Thank you for allowing pharmacy to be a part of this patient's care.  Link Snuffer, PharmD, BCPS, BCCCP Clinical Pharmacist Please refer to [facility] for [facility] numbers 12/25/2023 9:00 AM

## 2023-12-25 NOTE — Progress Notes (Signed)
 eLink Physician-Brief Progress Note Patient Name: [name] DOB: [birth year] MRN: [id number]   Date of Service  12/25/2023  HPI/Events of Note  58 year old bedbound patient who presents with worsening sacral wounds.  Complaining of insomnia at nighttime, requesting something for gas/cough/hiccups  eICU Interventions  Resume home Seroquel 25 mg nightly  Add simethicone     Intervention Category Minor Interventions: Routine modifications to care plan (e.g. PRN medications for pain, fever)  Crimson Dubberly 12/25/2023, 8:53 PM

## 2023-12-25 NOTE — Progress Notes (Signed)
 [facility] Progress Note  2 Days Post-Op  Subjective: CC:  Alert, reports mild abd discomfort after eating breakfast. Ostomy appliance full of gas.  WBC 13 Hgb 6.3 - getting a unit of blood now  CCM weaning pressors   Objective: Vital signs in last 24 hours: Temp:  [97.1 F (36.2 C)-98.8 F (37.1 C)] 97.9 F (36.6 C) (03/23 0927) Pulse Rate:  [56-78] 71 (03/23 0900) Resp:  [11-25] 18 (03/23 0900) BP: (82-128)/(43-82) 96/53 (03/23 0900) SpO2:  [94 %-100 %] 99 % (03/23 0900) Weight:  [48.5 kg] 48.5 kg (03/23 0500) Last BM Date : 12/24/23  Intake/Output from previous day: 03/22 0701 - 03/23 0700 In: 3599.7 [P.O.:680; I.V.:2308.6; IV Piggyback:611.1] Out: 2700 [Urine:2700] Intake/Output this shift: No intake/output data recorded.  PE: Gen:  Alert, NAD, pleasant Card:  Regular rate and rhythm Pulm:  Normal effort on room air Abd: Soft, colostomy left hemiabdomen productive Skin: warm and dry GU: wounds as below - no cellulitis   Psych: A&Ox3   Lab Results:  Recent Labs    12/25/23 0330 12/25/23 0400  WBC 13.6* 15.0*  HGB 6.3* 6.4*  HCT 21.1* 21.6*  PLT 635* 664*   BMET Recent Labs    12/24/23 0243 12/25/23 0330  NA 133* 135  K 3.1* 2.9*  CL 102 102  CO2 20* 24  GLUCOSE 203* 122*  BUN 18 15  CREATININE 1.20 1.08  CALCIUM 7.6* 7.3*   PT/INR Recent Labs    12/23/23 1833  LABPROT 15.4*  INR 1.2   CMP     Component Value Date/Time   NA 135 12/25/2023 0330   K 2.9 (L) 12/25/2023 0330   CL 102 12/25/2023 0330   CO2 24 12/25/2023 0330   GLUCOSE 122 (H) 12/25/2023 0330   BUN 15 12/25/2023 0330   CREATININE 1.08 12/25/2023 0330   CALCIUM 7.3 (L) 12/25/2023 0330   PROT 4.9 (L) 12/25/2023 0330   ALBUMIN <1.5 (L) 12/25/2023 0330   AST 26 12/25/2023 0330   ALT 24 12/25/2023 0330   ALKPHOS 72 12/25/2023 0330   BILITOT 0.2 12/25/2023 0330   GFRNONAA >60 12/25/2023 0330   GFRAA >60 06/30/2020 0527   Lipase     Component Value  Date/Time   LIPASE 34 03/07/2021 2159       Studies/Results: [place] EKG SITE RITE Result Date: 12/24/2023 If Site Rite image not attached, placement could not be confirmed due to current cardiac rhythm.  CT PELVIS WO CONTRAST Result Date: 12/23/2023 CLINICAL DATA:  Large sacral ulcer, assess for possible deep space infection. EXAM: CT PELVIS WITHOUT CONTRAST TECHNIQUE: Multidetector CT imaging of the pelvis was performed following the standard protocol without intravenous contrast. RADIATION DOSE REDUCTION: This exam was performed according to the departmental dose-optimization program which includes automated exposure control, adjustment of the mA and/or kV according to patient size and/or use of iterative reconstruction technique. COMPARISON:  08/08/2023. FINDINGS: Urinary Tract: No abnormality is visualized. The urinary bladder is nondistended. Bowel: A right lower quadrant ostomy is noted. No acute abnormality is seen. Vascular/Lymphatic: Prominent lymph nodes are present along the iliac chain bilaterally, likely reactive. Significant vascular abnormality is seen. Reproductive:  The prostate gland is within normal limits. Other: There is diastasis of the rectus abdominus with a broad-based hernia. No abdominopelvic ascites. Pelvic floor laxity is noted. Musculoskeletal: Decubitus ulcerations are noted at the ischial tuberosities bilaterally. There is debris or packing material in the left decubitus ulceration. Ulcerations extend into the inferior pubic rami regions  bilaterally. Soft tissue thickening and small joint effusion with air are noted at the left hip joint hip there is chronic bony deformity of the inferior aspect of the sacrum. Stable bony deformity with erosions are noted at the ischial tuberosity on the right and inferior pubic rami bilaterally. Increased erosions and bony destruction are noted along the posterior aspect of the ischium on the left. IMPRESSION: 1. Chronic left decubitus ulcer  with worsening erosions involving the ischium and inferior pubic ramus and extension into the left hip, concerning for acute on chronic osteomyelitis. 2. Chronic ischial decubitus ulcer on the right with stable bony erosions, likely reflecting chronic osteomyelitis. Electronically Signed   By: Thornell Sartorius M.D.   On: 12/23/2023 21:01   DG Chest Port 1 View Result Date: 12/23/2023 CLINICAL DATA:  Possible sepsis. EXAM: PORTABLE CHEST 1 VIEW COMPARISON:  08/08/2023. FINDINGS: The heart size and mediastinal contours are within normal limits. No consolidation, effusion, or pneumothorax. The bony structures are stable. IMPRESSION: No active disease. Electronically Signed   By: Thornell Sartorius M.D.   On: 12/23/2023 18:58    Anti-infectives: Anti-infectives (From admission, onward)    Start     Dose/Rate Route Frequency Ordered Stop   12/25/23 1900  vancomycin (VANCOCIN) IVPB 1000 mg/200 mL premix  Status:  Discontinued        1,000 mg 200 mL/hr over 60 Minutes Intravenous Every 48 hours 12/23/23 2206 12/24/23 1101   12/25/23 1900  vancomycin (VANCOREADY) IVPB 1250 mg/250 mL  Status:  Discontinued        1,250 mg 166.7 mL/hr over 90 Minutes Intravenous Every 48 hours 12/24/23 1101 12/24/23 1103   12/25/23 1900  vancomycin (VANCOCIN) IVPB 1000 mg/200 mL premix  Status:  Discontinued        1,000 mg 200 mL/hr over 60 Minutes Intravenous Every 48 hours 12/24/23 1103 12/25/23 0859   12/25/23 1900  vancomycin (VANCOREADY) IVPB 1250 mg/250 mL        1,250 mg 166.7 mL/hr over 90 Minutes Intravenous Every 48 hours 12/25/23 0859     12/23/23 2200  piperacillin-tazobactam (ZOSYN) IVPB 3.375 g  Status:  Discontinued        3.375 g 100 mL/hr over 30 Minutes Intravenous Every 8 hours 12/23/23 2107 12/23/23 2123   12/23/23 2200  piperacillin-tazobactam (ZOSYN) IVPB 3.375 g        3.375 g 12.5 mL/hr over 240 Minutes Intravenous Every 8 hours 12/23/23 2123     12/23/23 1800  cefTRIAXone (ROCEPHIN) 2 g in sodium  chloride 0.9 % 100 mL IVPB        2 g 200 mL/hr over 30 Minutes Intravenous Once 12/23/23 1759 12/23/23 1854   12/23/23 1800  vancomycin (VANCOCIN) IVPB 1000 mg/200 mL premix        1,000 mg 200 mL/hr over 60 Minutes Intravenous  Once 12/23/23 1759 12/23/23 2018        Assessment/Plan acute on chronic infected sacral and gluteal wounds, osteomyelitis   S/P IRRIGATION AND DEBRIDEMENT OF NECROTIZING SOFT TISSUE INFECTION 3/22 Dr. Azucena Cecil - POD#1 - afebrile, lactic acidosis resolved - Wound Cx NGTD - CCM weaning pressors - No role for further surgical debridement at this time. Continue twice daily moist-to-dry dressing changes. Continue to offload pressure as able.   FEN: Reg diet ID: Zosyn, Vanc VTE: SQH Foley: none, urostomy Dispo: ICU  Spinda bifida AKI UTI, chronic urostomy    LOS: 2 days   I reviewed nursing notes, hospitalist notes, last 24 h  vitals and pain scores, last 48 h intake and output, last 24 h labs and trends, and last 24 h imaging results.  This care required straight-forward level of medical decision making.   Hosie Spangle, PA-C [facility] Please see Amion for pager number during day hours 7:00am-4:30pm

## 2023-12-25 NOTE — Progress Notes (Signed)
 eLink Physician-Brief Progress Note Patient Name: [name] DOB: 1966/08/21 MRN: [id number]   Date of Service  12/25/2023  HPI/Events of Note  58 year old bedbound patient who presents with worsening sacral wounds found to have thrombocytosis, anemia, and no evidence of active bleeding.  eICU Interventions  Transfuse 1 unit PRBC  Trend hemoglobin every 6 hours     Intervention Category Intermediate Interventions: Bleeding - evaluation and treatment with blood products  Akshitha Culmer 12/25/2023, 5:04 AM

## 2023-12-25 NOTE — Anesthesia Postprocedure Evaluation (Signed)
 Anesthesia Post Note  Patient: [name]  Procedure(s) Performed: IRRIGATION AND DEBRIDEMENT OF NECROTIZING SOFT TISSUE INFECTION (Bilateral)     Patient location during evaluation: PACU Anesthesia Type: General Level of consciousness: awake and alert Pain management: pain level controlled Vital Signs Assessment: post-procedure vital signs reviewed and stable Respiratory status: spontaneous breathing, nonlabored ventilation, respiratory function stable and patient connected to nasal cannula oxygen Cardiovascular status: blood pressure returned to baseline and stable Postop Assessment: no apparent nausea or vomiting Anesthetic complications: no   No notable events documented.              Shelton Silvas

## 2023-12-25 NOTE — Consult Note (Signed)
 WOC consulted for wounds, however general surgery has debrided this patient orders updated. Patient known to [facility] nursing team as well. Has urostomy and colostomy  Orders updated for all  Re consult if needed, will not follow at this time. Thanks  Jahshua Bonito [employer], RN,CWOCN, CNS, CWON-AP [phone])

## 2023-12-26 ENCOUNTER — Encounter (HOSPITAL_COMMUNITY): Payer: Self-pay | Admitting: General Surgery

## 2023-12-26 DIAGNOSIS — A419 Sepsis, unspecified organism: Secondary | ICD-10-CM | POA: Diagnosis not present

## 2023-12-26 LAB — COMPREHENSIVE METABOLIC PANEL
ALT: 25 U/L (ref 0–44)
AST: 24 U/L (ref 15–41)
Albumin: <1.5 g/dL — ABNORMAL LOW (ref 3.5–5.0)
Alkaline Phosphatase: 60 U/L (ref 38–126)
Anion gap: 6 (ref 5–15)
BUN: 16 mg/dL (ref 6–20)
CO2: 24 mmol/L (ref 22–32)
Calcium: 7.3 mg/dL — ABNORMAL LOW (ref 8.9–10.3)
Chloride: 110 mmol/L (ref 98–111)
Creatinine, Ser: 1.59 mg/dL — ABNORMAL HIGH (ref 0.61–1.24)
GFR, Estimated: 51 mL/min — ABNORMAL LOW (ref >60)
Glucose, Bld: 73 mg/dL (ref 70–99)
Potassium: 3.8 mmol/L (ref 3.5–5.1)
Sodium: 140 mmol/L (ref 135–145)
Total Bilirubin: 0.4 mg/dL (ref 0.0–1.2)
Total Protein: 4.7 g/dL — ABNORMAL LOW (ref 6.5–8.1)

## 2023-12-26 LAB — CBC
HCT: 28.7 % — ABNORMAL LOW (ref 39.0–52.0)
Hemoglobin: 8.5 g/dL — ABNORMAL LOW (ref 13.0–17.0)
MCH: 23.1 pg — ABNORMAL LOW (ref 26.0–34.0)
MCHC: 29.6 g/dL — ABNORMAL LOW (ref 30.0–36.0)
MCV: 78 fL — ABNORMAL LOW (ref 80.0–100.0)
Platelets: 564 10*3/uL — ABNORMAL HIGH (ref 150–400)
RBC: 3.68 MIL/uL — ABNORMAL LOW (ref 4.22–5.81)
RDW: 18.8 % — ABNORMAL HIGH (ref 11.5–15.5)
WBC: 11 10*3/uL — ABNORMAL HIGH (ref 4.0–10.5)
nRBC: 0 % (ref 0.0–0.2)

## 2023-12-26 LAB — TYPE AND SCREEN
ABO/RH(D): A POS
Antibody Screen: NEGATIVE
Unit division: 0

## 2023-12-26 LAB — GLUCOSE, CAPILLARY
Glucose-Capillary: 54 mg/dL — ABNORMAL LOW (ref 70–99)
Glucose-Capillary: 55 mg/dL — ABNORMAL LOW (ref 70–99)
Glucose-Capillary: 137 mg/dL — ABNORMAL HIGH (ref 70–99)
Glucose-Capillary: 77 mg/dL (ref 70–99)
Glucose-Capillary: 95 mg/dL (ref 70–99)
Glucose-Capillary: 96 mg/dL (ref 70–99)
Glucose-Capillary: 92 mg/dL (ref 70–99)

## 2023-12-26 LAB — AEROBIC/ANAEROBIC CULTURE W GRAM STAIN (SURGICAL/DEEP WOUND): Gram Stain: NONE SEEN

## 2023-12-26 LAB — PHOSPHORUS: Phosphorus: 2.4 mg/dL — ABNORMAL LOW (ref 2.5–4.6)

## 2023-12-26 LAB — BPAM RBC
Blood Product Expiration Date: 202504152359
ISSUE DATE / TIME: 202503230813
Unit Type and Rh: 6200

## 2023-12-26 LAB — MAGNESIUM: Magnesium: 2 mg/dL (ref 1.7–2.4)

## 2023-12-26 MED ORDER — SODIUM CHLORIDE 0.9 % IV SOLN: INTRAVENOUS | Status: AC

## 2023-12-26 MED ORDER — DEXTROSE 50 % IV SOLN
INTRAVENOUS | Status: AC
  Filled 2023-12-26: qty 50

## 2023-12-26 MED ORDER — K PHOS MONO-SOD PHOS DI & MONO 155-852-130 MG PO TABS
500.0000 mg | ORAL_TABLET | Freq: Two times a day (BID) | ORAL | Status: AC
  Administered 2023-12-26 (×2): 500 mg via ORAL
  Filled 2023-12-26 (×2): qty 2

## 2023-12-26 MED ORDER — VANCOMYCIN HCL IN DEXTROSE 1-5 GM/200ML-% IV SOLN
1000.0000 mg | INTRAVENOUS | Status: DC
  Administered 2023-12-27: 1000 mg via INTRAVENOUS
  Filled 2023-12-26: qty 200

## 2023-12-26 NOTE — Progress Notes (Signed)
  Progress Note   Patient: [name] LOV:[id number] DOB: 28-Jul-1966 DOA: 12/23/2023     3 DOS: the patient was seen and examined on 12/26/2023   Brief hospital course:  58 y.o. Caucasian male with medical history significant for hypertension, dyslipidemia, s/p urostomy, s/p colostomy, urolithiasis, spina bifida and neurogenic bladder, sacral decubitus ulcer, who presented to the emergency room with worsening sacral wounds.  He had similar episode 08/2023 and he does follow with PA Stone and gets wound care.  Mostly bed bound, gets in wheelchair.  He lives with his sister who helps him with his ADLs. BP in the ED were low despite fluid challenges.  He was started on Levophed.  His LA was 3.3 and started on sepsis protocol and K+ was low at 2.4-getting replaced, WBC 16 and Cr 1.8.  He was given Ceftriaxone and Vancomycin in the ED.    Assessment and Plan:   Severe sepsis with shock Necrotizing skin and soft tissue infection Patient with a history of spina bifida and neurogenic bladder status post urostomy and diverting colostomy admitted to the hospital for severe sepsis from infected decubitus wounds. Patient is status post irrigation and debridement of necrotizing soft tissue infection Wound cultures yield moderate Bacteroides fragilis beta-lactamase positive Blood cultures no growth till date Patient is currently off vasopressors.  Leukocytosis shows a downward trend Continue empiric antibiotic therapy with vancomycin and Zosyn    Acute kidney injury Initially improved with IV fluid hydration but shows an upward trend Start patient on IV fluids and repeat renal parameters in a.m.    Diabetes mellitus Continue consistent carbohydrate diet Sliding scale insulin       Subjective: Patient is seen and examined at the bedside.  No new complaints  Physical Exam: Vitals:   12/26/23 1300 12/26/23 1400 12/26/23 1500 12/26/23 1532  BP: 112/65 (!) 91/49  98/65  Pulse: 75   74   Resp: (!) 22 20 (!) 24 16  Temp:    99.6 F (37.6 C)  TempSrc:    Axillary  SpO2: (!) 80%   99%  Weight:      Height:       General : Appears comfortable and in no obvious distress HEENT normocephalic atraumatic Pulmonary clear to auscultation currently room air no accessory use Cardiac regular rate and rhythm Wound soft not tender Neuro intact with chronic lower extremity contractures and immobility Skin sacral dressing intact GU clear yellow via ostomy    Data Reviewed:  Labs reviewed  Family Communication: Plan of care discussed with patient at the bedside.  He verbalizes understanding and agrees with the plan.  Disposition: Status is: Inpatient Remains inpatient appropriate because: Patient on IV antibiotic therapy  Planned Discharge Destination:  TBD    Time spent: 33 minutes  Author: Lucile Shutters, MD 12/26/2023 4:32 PM  For on call review [email or web address].

## 2023-12-26 NOTE — Progress Notes (Signed)
 Patient transferred to [phone] with phone charger glasses and clothing

## 2023-12-26 NOTE — Progress Notes (Signed)
 Pharmacy Antibiotic Note  [name] is a 58 y.o. male for which pharmacy has been consulted for vancomycin dosing for  sacral wound .  Patient with a history of HTN, dyslipidemia, urostomy, colostomy, spina bifida, neurogenic bladder, sacral decubitis ulcer. Patient presenting after wound care team recommended hospital evaluation.  SCr worsening again to 1.59, afebrile and WBC trending down.  Plan: Reduce vanc to 1000mg  IV Q48H for AUC 541 using SCr 1.59 Zosyn EID 3.375gm IV Q8H Monitor renal fxn, clinical progress, van levels as indicated  Height: 4\' 6"  (137.2 cm) Weight: 48.5 kg (106 lb 14.8 oz) IBW/kg (Calculated) : 36.2  Temp (24hrs), Avg:97.9 F (36.6 C), Min:97.7 F (36.5 C), Max:98.5 F (36.9 C)  Recent Labs  Lab 12/23/23 1833 12/23/23 1839 12/23/23 1840 12/24/23 0243 12/24/23 1116 12/25/23 0330 12/25/23 0400 12/26/23 0239  WBC 16.0*  --   --  16.6* 9.4 13.6* 15.0* 11.0*  CREATININE 1.71* 1.80*  --  1.20  --  1.08  --  1.59*  LATICACIDVEN  --   --  3.3* 1.6  --   --   --   --     Estimated Creatinine Clearance: 30.2 mL/min (A) (by C-G formula based on SCr of 1.59 mg/dL (H)).    Allergies  Allergen Reactions   Morphine And Codeine Nausea And Vomiting   Metrizamide    Sulfa Antibiotics Other (See Comments)    Reaction: unknown   Ivp Dye [Iodinated Contrast Media] Hives   Latex Rash    Ceftriaxone 3/21 x1 Vanc 3/21 >> Zosyn 3/21 >>  MRSA PCR - positive 3/21 BCx - NGTD 3/21 UCx - mult spp 3/21 path soft tissue resection cx -   Lydiana Milley D. Laney Potash, PharmD, BCPS, BCCCP 12/26/2023, 7:34 AM

## 2023-12-27 DIAGNOSIS — A419 Sepsis, unspecified organism: Secondary | ICD-10-CM | POA: Diagnosis not present

## 2023-12-27 LAB — GLUCOSE, CAPILLARY
Glucose-Capillary: 94 mg/dL (ref 70–99)
Glucose-Capillary: 79 mg/dL (ref 70–99)
Glucose-Capillary: 79 mg/dL (ref 70–99)
Glucose-Capillary: 128 mg/dL — ABNORMAL HIGH (ref 70–99)

## 2023-12-27 LAB — BASIC METABOLIC PANEL
Anion gap: 6 (ref 5–15)
BUN: 13 mg/dL (ref 6–20)
CO2: 24 mmol/L (ref 22–32)
Calcium: 7.4 mg/dL — ABNORMAL LOW (ref 8.9–10.3)
Chloride: 111 mmol/L (ref 98–111)
Creatinine, Ser: 1.38 mg/dL — ABNORMAL HIGH (ref 0.61–1.24)
GFR, Estimated: >60 mL/min (ref >60)
Glucose, Bld: 80 mg/dL (ref 70–99)
Potassium: 3.2 mmol/L — ABNORMAL LOW (ref 3.5–5.1)
Sodium: 141 mmol/L (ref 135–145)

## 2023-12-27 LAB — CBC
HCT: 29.6 % — ABNORMAL LOW (ref 39.0–52.0)
Hemoglobin: 8.7 g/dL — ABNORMAL LOW (ref 13.0–17.0)
MCH: 23.1 pg — ABNORMAL LOW (ref 26.0–34.0)
MCHC: 29.4 g/dL — ABNORMAL LOW (ref 30.0–36.0)
MCV: 78.7 fL — ABNORMAL LOW (ref 80.0–100.0)
Platelets: 598 10*3/uL — ABNORMAL HIGH (ref 150–400)
RBC: 3.76 MIL/uL — ABNORMAL LOW (ref 4.22–5.81)
RDW: 19.9 % — ABNORMAL HIGH (ref 11.5–15.5)
WBC: 10.2 10*3/uL (ref 4.0–10.5)
nRBC: 0 % (ref 0.0–0.2)

## 2023-12-27 LAB — MAGNESIUM: Magnesium: 1.8 mg/dL (ref 1.7–2.4)

## 2023-12-27 LAB — PHOSPHORUS: Phosphorus: 3.7 mg/dL (ref 2.5–4.6)

## 2023-12-27 MED ORDER — BENZONATATE 100 MG PO CAPS
200.0000 mg | ORAL_CAPSULE | Freq: Three times a day (TID) | ORAL | Status: DC | PRN
  Administered 2023-12-27: 200 mg via ORAL
  Filled 2023-12-27: qty 2

## 2023-12-27 MED ORDER — LACTATED RINGERS IV BOLUS
1000.0000 mL | Freq: Once | INTRAVENOUS | Status: AC
  Administered 2023-12-27: 1000 mL via INTRAVENOUS

## 2023-12-27 MED ORDER — MIDODRINE HCL 5 MG PO TABS
15.0000 mg | ORAL_TABLET | Freq: Once | ORAL | Status: AC
  Administered 2023-12-27: 15 mg via ORAL
  Filled 2023-12-27: qty 3

## 2023-12-27 NOTE — Progress Notes (Signed)
 [facility] night cross cover note:   I was notified by RN that this patient's blood pressure is running slightly softer now, with most recent blood pressure noted to be 87/45.  The patient denies any associated acute symptoms.  Additional vital signs appear stable, including afebrile, heart rates in the 60s, respiratory rate 20, and oxygen saturation 94% on room air.  Per brief chart review, it appears that the patient is hospitalized with infected sacral decubitus wounds, status post I&D by general surgery, and continuing on IV antibiotics.   Blood pressures have been intermittently low during this hospitalization, on scheduled midodrine 3 times daily.  Additionally, he underwent transfusion of 1 unit PRBC on 12/25/2023.  I subsequently ordered a 1 L LR bolus, an extra 1-time dose of his midodrine , as well as a STAT H&H.     Newton Pigg, DO Hospitalist

## 2023-12-27 NOTE — TOC Progression Note (Signed)
 Transition of Care [facility]) - Progression Note    Patient Details  Name: [name] MRN: [id number] Date of Birth: 20-Nov-1965  Transition of Care [facility]) CM/SW Contact  Gordy Clement, RN Phone Number: 12/27/2023, 1:54 PM  Clinical Narrative:     RNCM spoke with Patient regarding Wound Care he was receiving PTA. [facility] had provided services in the past  .Waiting to see if they can accept back .   Called and left VM for Sister to call me. Patient states he will relocate to live with her although he could not tell me address.   RNCM will follow          Expected Discharge Plan and Services                                               Social Determinants of Health (SDOH) Interventions SDOH Screenings   Food Insecurity: No Food Insecurity (12/24/2023)  Housing: Low Risk  (12/24/2023)  Transportation Needs: No Transportation Needs (12/24/2023)  Utilities: Not At Risk (12/24/2023)  Depression (PHQ2-9): Low Risk  (12/09/2021)  Financial Resource Strain: Low Risk  (07/29/2023)   Received from [facility]  Tobacco Use: Low Risk  (12/23/2023)    Readmission Risk Interventions    08/10/2023    3:37 PM  Readmission Risk Prevention Plan  Transportation Screening Complete  PCP or Specialist Appt within 3-5 Days Complete  Social Work Consult for Recovery Care Planning/Counseling Complete  Palliative Care Screening Not Applicable  Medication Review Oceanographer) Complete

## 2023-12-27 NOTE — Progress Notes (Addendum)
 [facility] Progress Note  4 Days Post-Op  Subjective: Doing well.  No new complaints.  Does see someone in [place] as outpatient for wound care, but would like to change.  Objective: Vital signs in last 24 hours: Temp:  [98.2 F (36.8 C)-99.6 F (37.6 C)] 98.6 F (37 C) (03/25 0744) Pulse Rate:  [62-95] 65 (03/25 0744) Resp:  [15-32] 17 (03/25 0744) BP: (86-121)/(44-65) 105/61 (03/25 0744) SpO2:  [77 %-99 %] 95 % (03/25 0744) Weight:  [51.7 kg] 51.7 kg (03/25 0500) Last BM Date : 12/26/23  Intake/Output from previous day: 03/24 0701 - 03/25 0700 In: 764.6 [P.O.:714; I.V.:0.7; IV Piggyback:50] Out: 2300 [Urine:1650; Stool:650] Intake/Output this shift: No intake/output data recorded.  PE: Gen:  Alert, NAD, pleasant GU: wounds are overall very clean. No evidence of cellulitis or infection.  Stable from pictures 2 days ago  Lab Results:  Recent Labs    12/25/23 0400 12/25/23 1110 12/25/23 2326 12/26/23 0239  WBC 15.0*  --   --  11.0*  HGB 6.4*   < > 8.4* 8.5*  HCT 21.6*   < > 28.0* 28.7*  PLT 664*  --   --  564*   < > = values in this interval not displayed.   BMET Recent Labs    12/25/23 0330 12/26/23 0239  NA 135 140  K 2.9* 3.8  CL 102 110  CO2 24 24  GLUCOSE 122* 73  BUN 15 16  CREATININE 1.08 1.59*  CALCIUM 7.3* 7.3*   PT/INR No results for input(s): "LABPROT", "INR" in the last 72 hours.  CMP     Component Value Date/Time   NA 140 12/26/2023 0239   K 3.8 12/26/2023 0239   CL 110 12/26/2023 0239   CO2 24 12/26/2023 0239   GLUCOSE 73 12/26/2023 0239   BUN 16 12/26/2023 0239   CREATININE 1.59 (H) 12/26/2023 0239   CALCIUM 7.3 (L) 12/26/2023 0239   PROT 4.7 (L) 12/26/2023 0239   ALBUMIN <1.5 (L) 12/26/2023 0239   AST 24 12/26/2023 0239   ALT 25 12/26/2023 0239   ALKPHOS 60 12/26/2023 0239   BILITOT 0.4 12/26/2023 0239   GFRNONAA 51 (L) 12/26/2023 0239   GFRAA >60 06/30/2020 0527   Lipase     Component Value Date/Time    LIPASE 34 03/07/2021 2159       Studies/Results: No results found.   Anti-infectives: Anti-infectives (From admission, onward)    Start     Dose/Rate Route Frequency Ordered Stop   12/27/23 1800  vancomycin (VANCOCIN) IVPB 1000 mg/200 mL premix        1,000 mg 200 mL/hr over 60 Minutes Intravenous Every 48 hours 12/26/23 0734     12/25/23 1900  vancomycin (VANCOCIN) IVPB 1000 mg/200 mL premix  Status:  Discontinued        1,000 mg 200 mL/hr over 60 Minutes Intravenous Every 48 hours 12/23/23 2206 12/24/23 1101   12/25/23 1900  vancomycin (VANCOREADY) IVPB 1250 mg/250 mL  Status:  Discontinued        1,250 mg 166.7 mL/hr over 90 Minutes Intravenous Every 48 hours 12/24/23 1101 12/24/23 1103   12/25/23 1900  vancomycin (VANCOCIN) IVPB 1000 mg/200 mL premix  Status:  Discontinued        1,000 mg 200 mL/hr over 60 Minutes Intravenous Every 48 hours 12/24/23 1103 12/25/23 0859   12/25/23 1900  vancomycin (VANCOREADY) IVPB 1250 mg/250 mL  Status:  Discontinued        1,250  mg 166.7 mL/hr over 90 Minutes Intravenous Every 48 hours 12/25/23 0859 12/26/23 0734   12/23/23 2200  piperacillin-tazobactam (ZOSYN) IVPB 3.375 g  Status:  Discontinued        3.375 g 100 mL/hr over 30 Minutes Intravenous Every 8 hours 12/23/23 2107 12/23/23 2123   12/23/23 2200  piperacillin-tazobactam (ZOSYN) IVPB 3.375 g        3.375 g 12.5 mL/hr over 240 Minutes Intravenous Every 8 hours 12/23/23 2123     12/23/23 1800  cefTRIAXone (ROCEPHIN) 2 g in sodium chloride 0.9 % 100 mL IVPB        2 g 200 mL/hr over 30 Minutes Intravenous Once 12/23/23 1759 12/23/23 1854   12/23/23 1800  vancomycin (VANCOCIN) IVPB 1000 mg/200 mL premix        1,000 mg 200 mL/hr over 60 Minutes Intravenous  Once 12/23/23 1759 12/23/23 2018        Assessment/Plan acute on chronic infected sacral and gluteal wounds, osteomyelitis   S/P IRRIGATION AND DEBRIDEMENT OF NECROTIZING SOFT TISSUE INFECTION 3/22 Dr. Azucena Cecil -  POD#3 - afebrile, lactic acidosis resolved - Wound Cx Bacteroides fragilis, abx per primary.  Wounds are currently clean with no further evidence of infection.  This bacteria noted could be colonization as well. - No role for further surgical debridement at this time. Continue twice daily moist-to-dry dressing changes. Continue to offload pressure as able.  -will need to follow up with the wound center at discharge for further chronic wound care. -no further surgical needs.  D/w primary service.  We will sign off at this time  FEN: Reg diet ID: Zosyn, Vanc VTE: SQH Foley: none, urostomy Dispo: ICU  Spinda bifida AKI UTI, chronic urostomy    LOS: 4 days   I reviewed nursing notes, hospitalist notes, last 24 h vitals and pain scores, last 48 h intake and output, last 24 h labs and trends, and last 24 h imaging results.   Letha Cape, [facility] Please see Amion for pager number during day hours 7:00am-4:30pm

## 2023-12-27 NOTE — Plan of Care (Signed)
  Problem: Coping: Goal: Ability to adjust to condition or change in health will improve Outcome: Progressing   Problem: Skin Integrity: Goal: Risk for impaired skin integrity will decrease Outcome: Progressing   Problem: Clinical Measurements: Goal: Will remain free from infection Outcome: Progressing

## 2023-12-27 NOTE — Progress Notes (Signed)
  Progress Note   Patient: [name] ZOX:[id number] DOB: 06-Sep-1966 DOA: 12/23/2023     4 DOS: the patient was seen and examined on 12/27/2023   Brief hospital course:  58 y.o. Caucasian male with medical history significant for hypertension, dyslipidemia, s/p urostomy, s/p colostomy, urolithiasis, spina bifida and neurogenic bladder, sacral decubitus ulcer, who presented to the emergency room with worsening sacral wounds.  He had similar episode 08/2023 and he does follow with PA Stone and gets wound care.  Mostly bed bound, gets in wheelchair.  He lives with his sister who helps him with his ADLs. BP in the ED were low despite fluid challenges.  He was started on Levophed.  His LA was 3.3 and started on sepsis protocol and K+ was low at 2.4-getting replaced, WBC 16 and Cr 1.8.  He was given Ceftriaxone and Vancomycin in the ED.    Assessment and Plan:   Severe sepsis with shock Necrotizing skin and soft tissue infection Patient with a history of spina bifida and neurogenic bladder status post urostomy and diverting colostomy admitted to the hospital for severe sepsis from infected decubitus wounds. Patient is status post irrigation and debridement of necrotizing soft tissue infection Wound cultures yield moderate Bacteroides fragilis beta-lactamase positive Surgery input greatly appreciated, continue with local wound care, no further need for debridement Patient is currently off vasopressors.  Leukocytosis shows a downward trend Continue empiric antibiotic therapy with vancomycin and Zosyn Blood cultures remain negative    Acute kidney injury Initially improved with IV fluid hydration but shows an upward trend, down to 1.38 today Start patient on IV fluids and repeat renal parameters in a.m.    Diabetes mellitus Continue consistent carbohydrate diet Sliding scale insulin  Hypokalemia -Replaced  Hypophosphatemia -Replaced      Subjective: Patient is seen and  examined at the bedside.  No new complaints  Physical Exam: Vitals:   12/27/23 0405 12/27/23 0500 12/27/23 0744 12/27/23 1200  BP: (!) 92/54  105/61 116/73  Pulse: 72  65 (!) 55  Resp: 20  17 14   Temp: 99.6 F (37.6 C)  98.6 F (37 C) 98.2 F (36.8 C)  TempSrc: Axillary  Axillary Oral  SpO2: 96%  95% 98%  Weight:  51.7 kg    Height:        Awake Alert, Oriented X 3, frail, in no apparent distress Symmetrical Chest wall movement, CTAB RRR,No Gallops,Rubs or new Murmurs, No Parasternal Heave +ve B.Sounds, colostomy and ileostomy bags present Extremity contracted    Data Reviewed:  Labs reviewed  Family Communication: Plan of care discussed with patient at the bedside.  He verbalizes understanding and agrees with the plan.  Disposition: Status is: Inpatient Remains inpatient appropriate because: Patient on IV antibiotic therapy  Planned Discharge Destination:  TBD      Author: Huey Bienenstock, MD 12/27/2023 3:04 PM  For on call review [email or web address].

## 2023-12-27 NOTE — Discharge Instructions (Signed)
WOUND CARE: - midline dressing to be changed twice daily - supplies: sterile saline, gauze, scissors, tape  - remove dressing and all packing carefully, moistening with sterile saline as needed to avoid packing/internal dressing sticking to the wound. - clean edges of skin around the wound with water/gauze, making sure there is no tape debris or leakage left on skin that could cause skin irritation or breakdown. - dampen and clean gauze with sterile saline and pack wound from wound base to skin level, making sure to take note of any possible areas of wound tracking, tunneling and packing appropriately. Wound can be packed loosely. Trim gauze to size if a whole gauze is not required. - cover wound with a dry gauze and secure with tape.  - write the date/time on the dry dressing/tape to better track when the last dressing change occurred. - change dressing as needed if leakage occurs, wound gets contaminated, or patient requests to shower. - patient may shower daily with wound open (i.e. remove all packing) and following the shower the wound should be dried and a clean dressing placed.

## 2023-12-27 NOTE — Progress Notes (Signed)
 [facility] night cross cover note:   Prn Tessalon Perles added for patient's cough.    Newton Pigg, DO Hospitalist

## 2023-12-27 NOTE — Progress Notes (Signed)
 CSW received call from Angelica Wash with [facility] [place] services asking for an update on patient, she assists patient at home. CSW requested release to discuss information and provided fax number. Release placed on chart, awaiting call back to provide brief update on patient.  Blenda Nicely, [place] Clinical Social Worker [phone]

## 2023-12-28 DIAGNOSIS — L89153 Pressure ulcer of sacral region, stage 3: Secondary | ICD-10-CM | POA: Diagnosis not present

## 2023-12-28 DIAGNOSIS — N179 Acute kidney failure, unspecified: Secondary | ICD-10-CM | POA: Diagnosis not present

## 2023-12-28 DIAGNOSIS — A419 Sepsis, unspecified organism: Secondary | ICD-10-CM | POA: Diagnosis not present

## 2023-12-28 LAB — HEMOGLOBIN AND HEMATOCRIT, BLOOD
HCT: 28.5 % — ABNORMAL LOW (ref 39.0–52.0)
Hemoglobin: 8.6 g/dL — ABNORMAL LOW (ref 13.0–17.0)

## 2023-12-28 LAB — CBC
HCT: 28.8 % — ABNORMAL LOW (ref 39.0–52.0)
Hemoglobin: 8.4 g/dL — ABNORMAL LOW (ref 13.0–17.0)
MCH: 23 pg — ABNORMAL LOW (ref 26.0–34.0)
MCHC: 29.2 g/dL — ABNORMAL LOW (ref 30.0–36.0)
MCV: 78.9 fL — ABNORMAL LOW (ref 80.0–100.0)
Platelets: 548 10*3/uL — ABNORMAL HIGH (ref 150–400)
RBC: 3.65 MIL/uL — ABNORMAL LOW (ref 4.22–5.81)
RDW: 20.3 % — ABNORMAL HIGH (ref 11.5–15.5)
WBC: 10.1 10*3/uL (ref 4.0–10.5)
nRBC: 0 % (ref 0.0–0.2)

## 2023-12-28 LAB — MAGNESIUM: Magnesium: 1.5 mg/dL — ABNORMAL LOW (ref 1.7–2.4)

## 2023-12-28 LAB — BASIC METABOLIC PANEL
Anion gap: 6 (ref 5–15)
BUN: 14 mg/dL (ref 6–20)
CO2: 25 mmol/L (ref 22–32)
Calcium: 7.5 mg/dL — ABNORMAL LOW (ref 8.9–10.3)
Chloride: 108 mmol/L (ref 98–111)
Creatinine, Ser: 1.21 mg/dL (ref 0.61–1.24)
GFR, Estimated: >60 mL/min (ref >60)
Glucose, Bld: 82 mg/dL (ref 70–99)
Potassium: 3.2 mmol/L — ABNORMAL LOW (ref 3.5–5.1)
Sodium: 139 mmol/L (ref 135–145)

## 2023-12-28 LAB — PHOSPHORUS: Phosphorus: 3.3 mg/dL (ref 2.5–4.6)

## 2023-12-28 LAB — CULTURE, BLOOD (ROUTINE X 2): Culture: NO GROWTH

## 2023-12-28 LAB — GLUCOSE, CAPILLARY
Glucose-Capillary: 71 mg/dL (ref 70–99)
Glucose-Capillary: 94 mg/dL (ref 70–99)

## 2023-12-28 MED ORDER — POTASSIUM CHLORIDE CRYS ER 20 MEQ PO TBCR
40.0000 meq | EXTENDED_RELEASE_TABLET | Freq: Once | ORAL | Status: AC
  Administered 2023-12-28: 40 meq via ORAL
  Filled 2023-12-28: qty 2

## 2023-12-28 MED ORDER — MIDODRINE HCL 5 MG PO TABS: 10.0000 mg | ORAL_TABLET | Freq: Three times a day (TID) | ORAL | Status: DC

## 2023-12-28 MED ORDER — MAGNESIUM SULFATE 4 GM/100ML IV SOLN
4.0000 g | Freq: Once | INTRAVENOUS | Status: AC
  Administered 2023-12-28: 4 g via INTRAVENOUS
  Filled 2023-12-28: qty 100

## 2023-12-28 MED ORDER — VANCOMYCIN HCL 750 MG/150ML IV SOLN
750.0000 mg | INTRAVENOUS | Status: DC
Start: 2023-12-28 — End: 2023-12-28

## 2023-12-28 MED ORDER — AMOXICILLIN-POT CLAVULANATE 875-125 MG PO TABS
1.0000 | ORAL_TABLET | Freq: Two times a day (BID) | ORAL | Status: DC
  Administered 2023-12-28 – 2023-12-30 (×5): 1 via ORAL
  Filled 2023-12-28 (×5): qty 1

## 2023-12-28 NOTE — Progress Notes (Signed)
 Pharmacy Antibiotic Note  [name] is a 58 y.o. male for which pharmacy has been consulted for vancomycin dosing for  sacral wound .  Patient with a history of HTN, dyslipidemia, urostomy, colostomy, spina bifida, neurogenic bladder, sacral decubitis ulcer. Patient presenting after wound care team recommended hospital evaluation.  SCr worsening again to 1.59, afebrile and WBC trending down.  3/26 AM update: Scr 1.59 >> 1.21  Plan: Change vanc to 750 mg IV Q24H for AUC 440 using SCr 1.21 Zosyn EID 3.375gm IV Q8H Monitor renal fxn, clinical progress, van levels as indicated  Height: 4\' 6"  (137.2 cm) Weight: 51.7 kg (113 lb 15.7 oz) IBW/kg (Calculated) : 36.2  Temp (24hrs), Avg:98.4 F (36.9 C), Min:97.2 F (36.2 C), Max:99.1 F (37.3 C)  Recent Labs  Lab 12/23/23 1840 12/24/23 0243 12/24/23 1116 12/25/23 0330 12/25/23 0400 12/26/23 0239 12/27/23 1107 12/28/23 0103 12/28/23 0505  WBC  --  16.6*   < > 13.6* 15.0* 11.0* 10.2  --  10.1  CREATININE  --  1.20  --  1.08  --  1.59* 1.38* 1.21  --   LATICACIDVEN 3.3* 1.6  --   --   --   --   --   --   --    < > = values in this interval not displayed.    Estimated Creatinine Clearance: 40.9 mL/min (by C-G formula based on SCr of 1.21 mg/dL).    Allergies  Allergen Reactions   Morphine And Codeine Nausea And Vomiting   Metrizamide    Sulfa Antibiotics Other (See Comments)    Reaction: unknown   Ivp Dye [Iodinated Contrast Media] Hives   Latex Rash    Ceftriaxone 3/21 x1 Vanc 3/21 >> Zosyn 3/21 >>  MRSA PCR - positive 3/21 BCx - NGTD 3/21 UCx - mult spp 3/21 path soft tissue resection cx - mod B frag BLpos 3/22 bcx- ngtd4  Dellas Guard BS, PharmD, BCPS Clinical Pharmacist 12/28/2023 9:03 AM  Contact: [phone] after 3 PM  "Be curious, not judgmental..." -Debbora Dus

## 2023-12-28 NOTE — Progress Notes (Signed)
  Progress Note   Date: 12/26/2023  Patient Name: [name]        MRN#: [id number]  Clarification of the debridement completed on 3/22:  Excisional debridement

## 2023-12-28 NOTE — Progress Notes (Addendum)
 PROGRESS NOTE        PATIENT DETAILS Name: [name] Age: 58 y.o. Sex: male Date of Birth: 10/01/1966 Admit Date: 12/23/2023 Admitting Physician Rozann Lesches, MD [facility]:QIONGE, Duane Lope, MD  Brief Summary: Patient is a 58 y.o.  male with history of spina bifida-s/p urostomy/colostomy/neurogenic bladder-chronic sacral decubitus ulcer-presented with septic shock secondary to necrotizing skin/soft tissue infection involving his gluteal wounds.  Initially admitted to the ICU-required pressors-evaluated by GI-underwent irrigation/debridement on 3/22-stabilized and transferred to [facility].  Significant events: 3/21>> admit to  ICU-septic shock-infected sacral decubitus wounds. 3/24>> transferred to [facility]  Significant studies: 3/21>> CT pelvis: Chronic left decubitus ulcer with worsening erosions involving ischium/pubic rami and extension into the left hip-concerning for acute on chronic osteomyelitis.  Significant microbiology data: 3/21>> surgical wound/path: Moderate Bacteroides fragilis 3/21>> blood culture: No growth  Procedures: 3/22>> irrigation/debridement of necrotizing soft tissue infection.  Consults: PCCM General surgery  Subjective: Lying comfortably in bed-denies any chest pain or shortness of breath.  Objective: Vitals: Blood pressure (!) 114/53, pulse 67, temperature (P) 98.2 F (36.8 C), temperature source (P) Axillary, resp. rate 20, height 4\' 6"  (1.372 m), weight 51.7 kg, SpO2 97%.   Exam: Gen Exam:Alert awake-not in any distress HEENT:atraumatic, normocephalic Chest: B/L clear to auscultation anteriorly CVS:S1S2 regular Abdomen:soft non tender, non distended Extremities:no edema-signal frequent muscle atrophy in lower extremities. Neurology: Paraplegia Skin: no rash  Pertinent Labs/Radiology:    Latest Ref Rng & Units 12/28/2023    5:05 AM 12/28/2023    1:22 AM 12/27/2023   11:07 AM  CBC  WBC 4.0 - 10.5 K/uL 10.1   10.2    Hemoglobin 13.0 - 17.0 g/dL 8.4  8.6  8.7   Hematocrit 39.0 - 52.0 % 28.8  28.5  29.6   Platelets 150 - 400 K/uL 548   598     Lab Results  Component Value Date   NA 139 12/28/2023   K 3.2 (L) 12/28/2023   CL 108 12/28/2023   CO2 25 12/28/2023     Assessment/Plan: Septic shock secondary to necrotizing soft tissue infection-in the setting of sacral decubitus ulceration stage IV/chronic osteomyelitis of ischial tuberosity/inferior pubic ramus. Sepsis physiology has resolved-with IV antibiotics and s/p debridement on 3/22. Initially admitted to the ICU-required pressors-now on oral midodrine. Stop Vanco/Zosyn-transition to Augmentin x 2 weeks total.  Asymptomatic bacteriuria UTI ruled out-not present on admission.  AKI Likely hemodynamically mediated Creatinine has stabilized  Hypomagnesemia/hypokalemia Replete/recheck.  Microcytic anemia Worsened due to acute illness-required 1 unit of PRBC on 3/23 Hb stable for the past several days Follow CBC periodically.  Prediabetes (A1c 6.3 on 3/22) Supportive care  HTN BP stable on midodrine All antihypertensives on hold.  History of spina bifid with neurogenic bladder-s/p urostomy/colostomy-wheelchair-bound Lives with sister and is dependent on her for ADLs.  Pressure Ulcer: Agree with assessment and plan as outlined below. Pressure Injury 12/24/23 Buttocks Right (Active)  12/24/23 0100  Location: Buttocks  Location Orientation: Right  Staging:   Wound Description (Comments):   Present on Admission: Yes  Dressing Type Gauze (Comment) 12/28/23 0800     Pressure Injury 12/24/23 Buttocks Left (Active)  12/24/23 0100  Location: Buttocks  Location Orientation: Left  Staging:   Wound Description (Comments):   Present on Admission: Yes  Dressing Type Gauze (Comment) 12/28/23 0800    BMI: Estimated body mass index is  27.48 kg/m as calculated from the following:   Height as of this encounter: 4\' 6"  (1.372 m).    Weight as of this encounter: 51.7 kg.   Code status:   Code Status: Full Code   DVT Prophylaxis: heparin injection 5,000 Units Start: 12/23/23 2200   Family Communication: None at bedside   Disposition Plan: Status is: Inpatient Remains inpatient appropriate because: Severity of illness   Planned Discharge Destination:Home health   Diet: Diet Order             Diet regular Room service appropriate? Yes with Assist; Fluid consistency: Thin  Diet effective now                     Antimicrobial agents: Anti-infectives (From admission, onward)    Start     Dose/Rate Route Frequency Ordered Stop   12/28/23 1800  vancomycin (VANCOREADY) IVPB 750 mg/150 mL  Status:  Discontinued        750 mg 150 mL/hr over 60 Minutes Intravenous Every 24 hours 12/28/23 0905 12/28/23 0911   12/28/23 1000  amoxicillin-clavulanate (AUGMENTIN) 875-125 MG per tablet 1 tablet        1 tablet Oral Every 12 hours 12/28/23 0911 01/07/24 0959   12/27/23 1800  vancomycin (VANCOCIN) IVPB 1000 mg/200 mL premix  Status:  Discontinued        1,000 mg 200 mL/hr over 60 Minutes Intravenous Every 48 hours 12/26/23 0734 12/28/23 0905   12/25/23 1900  vancomycin (VANCOCIN) IVPB 1000 mg/200 mL premix  Status:  Discontinued        1,000 mg 200 mL/hr over 60 Minutes Intravenous Every 48 hours 12/23/23 2206 12/24/23 1101   12/25/23 1900  vancomycin (VANCOREADY) IVPB 1250 mg/250 mL  Status:  Discontinued        1,250 mg 166.7 mL/hr over 90 Minutes Intravenous Every 48 hours 12/24/23 1101 12/24/23 1103   12/25/23 1900  vancomycin (VANCOCIN) IVPB 1000 mg/200 mL premix  Status:  Discontinued        1,000 mg 200 mL/hr over 60 Minutes Intravenous Every 48 hours 12/24/23 1103 12/25/23 0859   12/25/23 1900  vancomycin (VANCOREADY) IVPB 1250 mg/250 mL  Status:  Discontinued        1,250 mg 166.7 mL/hr over 90 Minutes Intravenous Every 48 hours 12/25/23 0859 12/26/23 0734   12/23/23 2200  piperacillin-tazobactam  (ZOSYN) IVPB 3.375 g  Status:  Discontinued        3.375 g 100 mL/hr over 30 Minutes Intravenous Every 8 hours 12/23/23 2107 12/23/23 2123   12/23/23 2200  piperacillin-tazobactam (ZOSYN) IVPB 3.375 g  Status:  Discontinued        3.375 g 12.5 mL/hr over 240 Minutes Intravenous Every 8 hours 12/23/23 2123 12/28/23 0911   12/23/23 1800  cefTRIAXone (ROCEPHIN) 2 g in sodium chloride 0.9 % 100 mL IVPB        2 g 200 mL/hr over 30 Minutes Intravenous Once 12/23/23 1759 12/23/23 1854   12/23/23 1800  vancomycin (VANCOCIN) IVPB 1000 mg/200 mL premix        1,000 mg 200 mL/hr over 60 Minutes Intravenous  Once 12/23/23 1759 12/23/23 2018        MEDICATIONS: Scheduled Meds:  sodium chloride   Intravenous Once   amoxicillin-clavulanate  1 tablet Oral Q12H   Chlorhexidine Gluconate Cloth  6 each Topical Daily   feeding supplement  237 mL Oral BID BM   heparin  5,000 Units Subcutaneous Q8H  insulin aspart  0-9 Units Subcutaneous TID WC   midodrine  15 mg Oral TID WC   mupirocin ointment  1 Application Nasal BID   QUEtiapine  25 mg Oral QHS   sodium chloride flush  10-40 mL Intracatheter Q12H   Continuous Infusions:  magnesium sulfate bolus IVPB 4 g (12/28/23 0926)   PRN Meds:.benzonatate, docusate sodium, ondansetron (ZOFRAN) IV, mouth rinse, polyethylene glycol, simethicone, sodium chloride flush   I have personally reviewed following labs and imaging studies  LABORATORY DATA: CBC: Recent Labs  Lab 12/23/23 1833 12/23/23 1839 12/25/23 0330 12/25/23 0400 12/25/23 1110 12/25/23 2326 12/26/23 0239 12/27/23 1107 12/28/23 0122 12/28/23 0505  WBC 16.0*   < > 13.6* 15.0*  --   --  11.0* 10.2  --  10.1  NEUTROABS 13.7*  --   --   --   --   --   --   --   --   --   HGB 8.2*   < > 6.3* 6.4*   < > 8.4* 8.5* 8.7* 8.6* 8.4*  HCT 28.5*   < > 21.1* 21.6*   < > 28.0* 28.7* 29.6* 28.5* 28.8*  MCV 73.8*   < > 72.3* 73.0*  --   --  78.0* 78.7*  --  78.9*  PLT 743*   < > 635* 664*  --   --   564* 598*  --  548*   < > = values in this interval not displayed.    Basic Metabolic Panel: Recent Labs  Lab 12/24/23 0243 12/25/23 0330 12/26/23 0239 12/27/23 1107 12/28/23 0103  NA 133* 135 140 141 139  K 3.1* 2.9* 3.8 3.2* 3.2*  CL 102 102 110 111 108  CO2 20* 24 24 24 25   GLUCOSE 203* 122* 73 80 82  BUN 18 15 16 13 14   CREATININE 1.20 1.08 1.59* 1.38* 1.21  CALCIUM 7.6* 7.3* 7.3* 7.4* 7.5*  MG 1.8 2.3 2.0 1.8 1.5*  PHOS 2.1* 3.5 2.4* 3.7 3.3    GFR: Estimated Creatinine Clearance: 40.9 mL/min (by C-G formula based on SCr of 1.21 mg/dL).  Liver Function Tests: Recent Labs  Lab 12/23/23 1833 12/24/23 0243 12/25/23 0330 12/26/23 0239  AST 43* 38 26 24  ALT 39 32 24 25  ALKPHOS 113 96 72 60  BILITOT 0.3 0.3 0.2 0.4  PROT 6.4* 5.3* 4.9* 4.7*  ALBUMIN 1.6* <1.5* <1.5* <1.5*   No results for input(s): "LIPASE", "AMYLASE" in the last 168 hours. No results for input(s): "AMMONIA" in the last 168 hours.  Coagulation Profile: Recent Labs  Lab 12/23/23 1833  INR 1.2    Cardiac Enzymes: No results for input(s): "CKTOTAL", "CKMB", "CKMBINDEX", "TROPONINI" in the last 168 hours.  BNP (last 3 results) No results for input(s): "PROBNP" in the last 8760 hours.  Lipid Profile: No results for input(s): "CHOL", "HDL", "LDLCALC", "TRIG", "CHOLHDL", "LDLDIRECT" in the last 72 hours.  Thyroid Function Tests: No results for input(s): "TSH", "T4TOTAL", "FREET4", "T3FREE", "THYROIDAB" in the last 72 hours.  Anemia Panel: No results for input(s): "VITAMINB12", "FOLATE", "FERRITIN", "TIBC", "IRON", "RETICCTPCT" in the last 72 hours.  Urine analysis:    Component Value Date/Time   COLORURINE YELLOW 12/23/2023 1835   APPEARANCEUR CLOUDY (A) 12/23/2023 1835   APPEARANCEUR Cloudy (A) 03/21/2019 1130   LABSPEC RESULTS UNAVAILABLE DUE TO INTERFERING SUBSTANCE 12/23/2023 1835   PHURINE RESULTS UNAVAILABLE DUE TO INTERFERING SUBSTANCE 12/23/2023 1835   GLUCOSEU RESULTS  UNAVAILABLE DUE TO INTERFERING SUBSTANCE (A) 12/23/2023 1835  HGBUR RESULTS UNAVAILABLE DUE TO INTERFERING SUBSTANCE (A) 12/23/2023 1835   BILIRUBINUR RESULTS UNAVAILABLE DUE TO INTERFERING SUBSTANCE (A) 12/23/2023 1835   BILIRUBINUR Negative 03/21/2019 1130   KETONESUR RESULTS UNAVAILABLE DUE TO INTERFERING SUBSTANCE (A) 12/23/2023 1835   PROTEINUR RESULTS UNAVAILABLE DUE TO INTERFERING SUBSTANCE (A) 12/23/2023 1835   NITRITE RESULTS UNAVAILABLE DUE TO INTERFERING SUBSTANCE (A) 12/23/2023 1835   LEUKOCYTESUR RESULTS UNAVAILABLE DUE TO INTERFERING SUBSTANCE (A) 12/23/2023 1835    Sepsis Labs: Lactic Acid, Venous    Component Value Date/Time   LATICACIDVEN 1.6 12/24/2023 0243    MICROBIOLOGY: Recent Results (from the past 240 hours)  Blood Culture (routine x 2)     Status: None   Collection Time: 12/23/23  6:33 PM   Specimen: BLOOD LEFT WRIST  Result Value Ref Range Status   Specimen Description BLOOD LEFT WRIST  Final   Special Requests   Final    BOTTLES DRAWN AEROBIC AND ANAEROBIC Blood Culture results may not be optimal due to an inadequate volume of blood received in culture bottles   Culture   Final    NO GROWTH 5 DAYS Performed at [facility], [address], [place], [place] [address]    Report Status 12/28/2023 FINAL  Final  Urine Culture     Status: Abnormal   Collection Time: 12/23/23  6:35 PM   Specimen: Urine, Catheterized  Result Value Ref Range Status   Specimen Description URINE, CATHETERIZED  Final   Special Requests   Final    NONE Reflexed from [id number] Performed at [facility], [address], [place], [place] [address]    Culture MULTIPLE SPECIES PRESENT, SUGGEST RECOLLECTION (A)  Final   Report Status 12/25/2023 FINAL  Final  MRSA Next Gen by PCR, Nasal     Status: Abnormal   Collection Time: 12/23/23 10:39 PM   Specimen: Nasal Mucosa; Nasal Swab  Result Value Ref Range Status   MRSA by PCR Next Gen DETECTED (A) NOT DETECTED Final     Comment: RESULT CALLED TO, READ BACK BY AND VERIFIED WITH: D WOODY RN 12/24/2023 @ 0313 BY AB (NOTE) The GeneXpert MRSA Assay (FDA approved for NASAL specimens only), is one component of a comprehensive MRSA colonization surveillance program. It is not intended to diagnose MRSA infection nor to guide or monitor treatment for MRSA infections. Test performance is not FDA approved in patients less than 58 years old. Performed at [facility], [address] [place], [place] [address]   Aerobic/Anaerobic Culture w Gram Stain (surgical/deep wound)     Status: None   Collection Time: 12/23/23 11:07 PM   Specimen: PATH Soft tissue resection  Result Value Ref Range Status   Specimen Description OTHER  Final   Special Requests SOFT  Final   Gram Stain NO WBC SEEN NO ORGANISMS SEEN   Final   Culture   Final    MODERATE BACTEROIDES FRAGILIS BETA LACTAMASE POSITIVE Performed at [facility], [address] [place], [place] [address]    Report Status 12/26/2023 FINAL  Final  Blood Culture (routine x 2)     Status: None (Preliminary result)   Collection Time: 12/24/23  8:26 AM   Specimen: BLOOD RIGHT ARM  Result Value Ref Range Status   Specimen Description BLOOD RIGHT ARM  Final   Special Requests   Final    AEROBIC BOTTLE ONLY Blood Culture results may not be optimal due to an inadequate volume of blood received  in culture bottles   Culture   Final    NO GROWTH 4 DAYS Performed at [facility], [address] [place], [place] [address]    Report Status PENDING  Incomplete    RADIOLOGY STUDIES/RESULTS: No results found.   LOS: 5 days   Jeoffrey Massed, MD  [facility]    To contact the attending provider between 7A-7P or the covering provider during after hours 7P-7A, please log into the web site [email or web address] and access using universal [employer] password for that web site. If you do not have the password, please call the hospital  operator.  12/28/2023, 10:34 AM

## 2023-12-28 NOTE — Plan of Care (Signed)
  Problem: Coping: Goal: Ability to adjust to condition or change in health will improve Outcome: Progressing   Problem: Skin Integrity: Goal: Risk for impaired skin integrity will decrease Outcome: Progressing   Problem: Clinical Measurements: Goal: Will remain free from infection Outcome: Progressing

## 2023-12-29 DIAGNOSIS — L89153 Pressure ulcer of sacral region, stage 3: Secondary | ICD-10-CM | POA: Diagnosis not present

## 2023-12-29 DIAGNOSIS — N179 Acute kidney failure, unspecified: Secondary | ICD-10-CM | POA: Diagnosis not present

## 2023-12-29 DIAGNOSIS — A419 Sepsis, unspecified organism: Secondary | ICD-10-CM | POA: Diagnosis not present

## 2023-12-29 LAB — BASIC METABOLIC PANEL WITH GFR
Anion gap: 7 (ref 5–15)
BUN: 11 mg/dL (ref 6–20)
CO2: 24 mmol/L (ref 22–32)
Calcium: 7.8 mg/dL — ABNORMAL LOW (ref 8.9–10.3)
Chloride: 109 mmol/L (ref 98–111)
Creatinine, Ser: 1.07 mg/dL (ref 0.61–1.24)
GFR, Estimated: >60 mL/min (ref >60)
Glucose, Bld: 77 mg/dL (ref 70–99)
Potassium: 3.9 mmol/L (ref 3.5–5.1)
Sodium: 140 mmol/L (ref 135–145)

## 2023-12-29 LAB — CULTURE, BLOOD (ROUTINE X 2): Culture: NO GROWTH

## 2023-12-29 LAB — MAGNESIUM: Magnesium: 2.7 mg/dL — ABNORMAL HIGH (ref 1.7–2.4)

## 2023-12-29 LAB — PHOSPHORUS: Phosphorus: 2.6 mg/dL (ref 2.5–4.6)

## 2023-12-29 MED ORDER — MIDODRINE HCL 5 MG PO TABS
5.0000 mg | ORAL_TABLET | Freq: Three times a day (TID) | ORAL | Status: DC
  Administered 2023-12-29 (×3): 5 mg via ORAL
  Filled 2023-12-29 (×4): qty 1

## 2023-12-29 NOTE — TOC Transition Note (Signed)
 Transition of Care [facility]) - Discharge Note   Patient Details  Name: [name] MRN: [id number] Date of Birth: May 19, 1966  [facility] CM/SW Contact:  Gordy Clement, RN Phone Number: 12/29/2023, 12:30 PM   Clinical Narrative:     Anticipate patient to DC on Friday  RNCM spoke with Sister who is the caregiver. Sister is not interested in any Home Health. She states she can pick up patient tomorrow after 3:00 when she finishes work  A new wheelchair has been ordered from [employer] to be delivered bedside             Patient Goals and CMS Choice            Discharge Placement                       Discharge Plan and Services Additional resources added to the After Visit Summary for                                       Social Drivers of Health (SDOH) Interventions SDOH Screenings   Food Insecurity: No Food Insecurity (12/24/2023)  Housing: Low Risk  (12/24/2023)  Transportation Needs: No Transportation Needs (12/24/2023)  Utilities: Not At Risk (12/24/2023)  Depression (PHQ2-9): Low Risk  (12/09/2021)  Financial Resource Strain: Low Risk  (07/29/2023)   Received from [facility]  Tobacco Use: Low Risk  (12/23/2023)     Readmission Risk Interventions    08/10/2023    3:37 PM  Readmission Risk Prevention Plan  Transportation Screening Complete  PCP or Specialist Appt within 3-5 Days Complete  Social Work Consult for Recovery Care Planning/Counseling Complete  Palliative Care Screening Not Applicable  Medication Review [employer]) Complete

## 2023-12-29 NOTE — Plan of Care (Signed)

## 2023-12-29 NOTE — Plan of Care (Signed)
  Problem: Education: Goal: Ability to describe self-care measures that may prevent or decrease complications (Diabetes Survival Skills Education) will improve Outcome: Progressing Goal: Individualized Educational Video(s) Outcome: Progressing   Problem: Coping: Goal: Ability to adjust to condition or change in health will improve Outcome: Progressing   Problem: Health Behavior/Discharge Planning: Goal: Ability to identify and utilize available resources and services will improve Outcome: Progressing Goal: Ability to manage health-related needs will improve Outcome: Progressing   Problem: Metabolic: Goal: Ability to maintain appropriate glucose levels will improve Outcome: Progressing   Problem: Nutritional: Goal: Maintenance of adequate nutrition will improve Outcome: Progressing Goal: Progress toward achieving an optimal weight will improve Outcome: Progressing   Problem: Tissue Perfusion: Goal: Adequacy of tissue perfusion will improve Outcome: Progressing   Problem: Education: Goal: Knowledge of General Education information will improve Description: Including pain rating scale, medication(s)/side effects and non-pharmacologic comfort measures Outcome: Progressing   Problem: Health Behavior/Discharge Planning: Goal: Ability to manage health-related needs will improve Outcome: Progressing   Problem: Clinical Measurements: Goal: Ability to maintain clinical measurements within normal limits will improve Outcome: Progressing Goal: Will remain free from infection Outcome: Progressing Goal: Diagnostic test results will improve Outcome: Progressing Goal: Respiratory complications will improve Outcome: Progressing Goal: Cardiovascular complication will be avoided Outcome: Progressing   Problem: Activity: Goal: Risk for activity intolerance will decrease Outcome: Progressing   Problem: Nutrition: Goal: Adequate nutrition will be maintained Outcome: Progressing    Problem: Pain Managment: Goal: General experience of comfort will improve and/or be controlled Outcome: Progressing   Problem: Elimination: Goal: Will not experience complications related to bowel motility Outcome: Progressing Goal: Will not experience complications related to urinary retention Outcome: Progressing   Problem: Safety: Goal: Ability to remain free from injury will improve Outcome: Progressing   Problem: Skin Integrity: Goal: Risk for impaired skin integrity will decrease Outcome: Progressing

## 2023-12-29 NOTE — Progress Notes (Signed)
 PROGRESS NOTE        PATIENT DETAILS Name: [name] Age: 58 y.o. Sex: male Date of Birth: 10-10-1965 Admit Date: 12/23/2023 Admitting Physician Rozann Lesches, MD ZOX:[id number], Duane Lope, MD  Brief Summary: Patient is a 58 y.o.  male with history of spina bifida-s/p urostomy/colostomy/neurogenic bladder-chronic sacral decubitus ulcer-presented with septic shock secondary to necrotizing skin/soft tissue infection involving his gluteal wounds.  Initially admitted to the ICU-required pressors-evaluated by GI-underwent irrigation/debridement on 3/22-stabilized and transferred to [facility].  Significant events: 3/21>> admit to  ICU-septic shock-infected sacral decubitus wounds. 3/24>> transferred to [facility]  Significant studies: 3/21>> CT pelvis: Chronic left decubitus ulcer with worsening erosions involving ischium/pubic rami and extension into the left hip-concerning for acute on chronic osteomyelitis.  Significant microbiology data: 3/21>> surgical wound/path: Moderate Bacteroides fragilis 3/21>> blood culture: No growth  Procedures: 3/22>> irrigation/debridement of necrotizing soft tissue infection.  Consults: PCCM General surgery  Subjective: Lying comfortably in bed-no major issues overnight.  Objective: Vitals: Blood pressure 91/63, pulse 62, temperature 98.4 F (36.9 C), temperature source Oral, resp. rate 18, height 4\' 6"  (1.372 m), weight 46.7 kg, SpO2 96%.   Exam: Gen Exam:Alert awake-not in any distress HEENT:atraumatic, normocephalic Chest: B/L clear to auscultation anteriorly CVS:S1S2 regular Abdomen:soft non tender, non distended Extremities:no edema Neurology: Paraplegic Skin: no rash  Pertinent Labs/Radiology:    Latest Ref Rng & Units 12/28/2023    5:05 AM 12/28/2023    1:22 AM 12/27/2023   11:07 AM  CBC  WBC 4.0 - 10.5 K/uL 10.1   10.2   Hemoglobin 13.0 - 17.0 g/dL 8.4  8.6  8.7   Hematocrit 39.0 - 52.0 % 28.8  28.5  29.6    Platelets 150 - 400 K/uL 548   598     Lab Results  Component Value Date   NA 140 12/29/2023   K 3.9 12/29/2023   CL 109 12/29/2023   CO2 24 12/29/2023     Assessment/Plan: Septic shock secondary to necrotizing soft tissue infection-in the setting of sacral decubitus ulceration stage IV/chronic osteomyelitis of ischial tuberosity/inferior pubic ramus. Sepsis visit has resolved S/p debridement on 3/22 Transition from IV Zosyn/vancomycin to Augmentin on 3/26-will plan for 14 weeks treatment in total Will discuss with family regarding wound care-per patient-his sister is his primary caregiver-she is a [employer] fundraiser.    Asymptomatic bacteriuria UTI ruled out-not present on admission.  AKI Likely hemodynamically mediated Creatinine has stabilized  Hypomagnesemia/hypokalemia Replete/recheck.  Microcytic anemia Worsened due to acute illness-required 1 unit of PRBC on 3/23 Hb stable for the past several days Follow CBC periodically.  Prediabetes (A1c 6.3 on 3/22) Supportive care  HTN BP soft but stable-will attempt to titrate down midodrine further Remains off all antihypertensives.  History of spina bifid with neurogenic bladder-s/p urostomy/colostomy-wheelchair-bound Lives with sister and is dependent on her for ADLs.  Pressure Ulcer: Agree with assessment and plan as outlined below. Pressure Injury 12/24/23 Buttocks Right (Active)  12/24/23 0100  Location: Buttocks  Location Orientation: Right  Staging:   Wound Description (Comments):   Present on Admission: Yes  Dressing Type Gauze (Comment) 12/29/23 0731     Pressure Injury 12/24/23 Buttocks Left (Active)  12/24/23 0100  Location: Buttocks  Location Orientation: Left  Staging:   Wound Description (Comments):   Present on Admission: Yes  Dressing Type Compression wrap 12/29/23 0731    BMI:  Estimated body mass index is 24.82 kg/m as calculated from the following:   Height as of this encounter: 4\' 6"  (1.372 m).    Weight as of this encounter: 46.7 kg.   Code status:   Code Status: Full Code   DVT Prophylaxis: heparin injection 5,000 Units Start: 12/23/23 2200   Family Communication: Sister-[name] updated 3/27.   Disposition Plan: Status is: Inpatient Remains inpatient appropriate because: Severity of illness   Planned Discharge Destination:Home health   Diet: Diet Order             Diet regular Room service appropriate? Yes with Assist; Fluid consistency: Thin  Diet effective now                     Antimicrobial agents: Anti-infectives (From admission, onward)    Start     Dose/Rate Route Frequency Ordered Stop   12/28/23 1800  vancomycin (VANCOREADY) IVPB 750 mg/150 mL  Status:  Discontinued        750 mg 150 mL/hr over 60 Minutes Intravenous Every 24 hours 12/28/23 0905 12/28/23 0911   12/28/23 1000  amoxicillin-clavulanate (AUGMENTIN) 875-125 MG per tablet 1 tablet        1 tablet Oral Every 12 hours 12/28/23 0911 01/07/24 0959   12/27/23 1800  vancomycin (VANCOCIN) IVPB 1000 mg/200 mL premix  Status:  Discontinued        1,000 mg 200 mL/hr over 60 Minutes Intravenous Every 48 hours 12/26/23 0734 12/28/23 0905   12/25/23 1900  vancomycin (VANCOCIN) IVPB 1000 mg/200 mL premix  Status:  Discontinued        1,000 mg 200 mL/hr over 60 Minutes Intravenous Every 48 hours 12/23/23 2206 12/24/23 1101   12/25/23 1900  vancomycin (VANCOREADY) IVPB 1250 mg/250 mL  Status:  Discontinued        1,250 mg 166.7 mL/hr over 90 Minutes Intravenous Every 48 hours 12/24/23 1101 12/24/23 1103   12/25/23 1900  vancomycin (VANCOCIN) IVPB 1000 mg/200 mL premix  Status:  Discontinued        1,000 mg 200 mL/hr over 60 Minutes Intravenous Every 48 hours 12/24/23 1103 12/25/23 0859   12/25/23 1900  vancomycin (VANCOREADY) IVPB 1250 mg/250 mL  Status:  Discontinued        1,250 mg 166.7 mL/hr over 90 Minutes Intravenous Every 48 hours 12/25/23 0859 12/26/23 0734   12/23/23 2200   piperacillin-tazobactam (ZOSYN) IVPB 3.375 g  Status:  Discontinued        3.375 g 100 mL/hr over 30 Minutes Intravenous Every 8 hours 12/23/23 2107 12/23/23 2123   12/23/23 2200  piperacillin-tazobactam (ZOSYN) IVPB 3.375 g  Status:  Discontinued        3.375 g 12.5 mL/hr over 240 Minutes Intravenous Every 8 hours 12/23/23 2123 12/28/23 0911   12/23/23 1800  cefTRIAXone (ROCEPHIN) 2 g in sodium chloride 0.9 % 100 mL IVPB        2 g 200 mL/hr over 30 Minutes Intravenous Once 12/23/23 1759 12/23/23 1854   12/23/23 1800  vancomycin (VANCOCIN) IVPB 1000 mg/200 mL premix        1,000 mg 200 mL/hr over 60 Minutes Intravenous  Once 12/23/23 1759 12/23/23 2018        MEDICATIONS: Scheduled Meds:  sodium chloride   Intravenous Once   amoxicillin-clavulanate  1 tablet Oral Q12H   Chlorhexidine Gluconate Cloth  6 each Topical Daily   feeding supplement  237 mL Oral BID BM   heparin  5,000  Units Subcutaneous Q8H   midodrine  5 mg Oral TID WC   QUEtiapine  25 mg Oral QHS   sodium chloride flush  10-40 mL Intracatheter Q12H   Continuous Infusions:   PRN Meds:.benzonatate, docusate sodium, ondansetron (ZOFRAN) IV, mouth rinse, polyethylene glycol, simethicone, sodium chloride flush   I have personally reviewed following labs and imaging studies  LABORATORY DATA: CBC: Recent Labs  Lab 12/23/23 1833 12/23/23 1839 12/25/23 0330 12/25/23 0400 12/25/23 1110 12/25/23 2326 12/26/23 0239 12/27/23 1107 12/28/23 0122 12/28/23 0505  WBC 16.0*   < > 13.6* 15.0*  --   --  11.0* 10.2  --  10.1  NEUTROABS 13.7*  --   --   --   --   --   --   --   --   --   HGB 8.2*   < > 6.3* 6.4*   < > 8.4* 8.5* 8.7* 8.6* 8.4*  HCT 28.5*   < > 21.1* 21.6*   < > 28.0* 28.7* 29.6* 28.5* 28.8*  MCV 73.8*   < > 72.3* 73.0*  --   --  78.0* 78.7*  --  78.9*  PLT 743*   < > 635* 664*  --   --  564* 598*  --  548*   < > = values in this interval not displayed.    Basic Metabolic Panel: Recent Labs  Lab  12/25/23 0330 12/26/23 0239 12/27/23 1107 12/28/23 0103 12/29/23 0400  NA 135 140 141 139 140  K 2.9* 3.8 3.2* 3.2* 3.9  CL 102 110 111 108 109  CO2 24 24 24 25 24   GLUCOSE 122* 73 80 82 77  BUN 15 16 13 14 11   CREATININE 1.08 1.59* 1.38* 1.21 1.07  CALCIUM 7.3* 7.3* 7.4* 7.5* 7.8*  MG 2.3 2.0 1.8 1.5* 2.7*  PHOS 3.5 2.4* 3.7 3.3 2.6    GFR: Estimated Creatinine Clearance: 44 mL/min (by C-G formula based on SCr of 1.07 mg/dL).  Liver Function Tests: Recent Labs  Lab 12/23/23 1833 12/24/23 0243 12/25/23 0330 12/26/23 0239  AST 43* 38 26 24  ALT 39 32 24 25  ALKPHOS 113 96 72 60  BILITOT 0.3 0.3 0.2 0.4  PROT 6.4* 5.3* 4.9* 4.7*  ALBUMIN 1.6* <1.5* <1.5* <1.5*   No results for input(s): "LIPASE", "AMYLASE" in the last 168 hours. No results for input(s): "AMMONIA" in the last 168 hours.  Coagulation Profile: Recent Labs  Lab 12/23/23 1833  INR 1.2    Cardiac Enzymes: No results for input(s): "CKTOTAL", "CKMB", "CKMBINDEX", "TROPONINI" in the last 168 hours.  BNP (last 3 results) No results for input(s): "PROBNP" in the last 8760 hours.  Lipid Profile: No results for input(s): "CHOL", "HDL", "LDLCALC", "TRIG", "CHOLHDL", "LDLDIRECT" in the last 72 hours.  Thyroid Function Tests: No results for input(s): "TSH", "T4TOTAL", "FREET4", "T3FREE", "THYROIDAB" in the last 72 hours.  Anemia Panel: No results for input(s): "VITAMINB12", "FOLATE", "FERRITIN", "TIBC", "IRON", "RETICCTPCT" in the last 72 hours.  Urine analysis:    Component Value Date/Time   COLORURINE YELLOW 12/23/2023 1835   APPEARANCEUR CLOUDY (A) 12/23/2023 1835   APPEARANCEUR Cloudy (A) 03/21/2019 1130   LABSPEC RESULTS UNAVAILABLE DUE TO INTERFERING SUBSTANCE 12/23/2023 1835   PHURINE RESULTS UNAVAILABLE DUE TO INTERFERING SUBSTANCE 12/23/2023 1835   GLUCOSEU RESULTS UNAVAILABLE DUE TO INTERFERING SUBSTANCE (A) 12/23/2023 1835   HGBUR RESULTS UNAVAILABLE DUE TO INTERFERING SUBSTANCE (A)  12/23/2023 1835   BILIRUBINUR RESULTS UNAVAILABLE DUE TO INTERFERING SUBSTANCE (A) 12/23/2023  1835   BILIRUBINUR Negative 03/21/2019 1130   KETONESUR RESULTS UNAVAILABLE DUE TO INTERFERING SUBSTANCE (A) 12/23/2023 1835   PROTEINUR RESULTS UNAVAILABLE DUE TO INTERFERING SUBSTANCE (A) 12/23/2023 1835   NITRITE RESULTS UNAVAILABLE DUE TO INTERFERING SUBSTANCE (A) 12/23/2023 1835   LEUKOCYTESUR RESULTS UNAVAILABLE DUE TO INTERFERING SUBSTANCE (A) 12/23/2023 1835    Sepsis Labs: Lactic Acid, Venous    Component Value Date/Time   LATICACIDVEN 1.6 12/24/2023 0243    MICROBIOLOGY: Recent Results (from the past 240 hours)  Blood Culture (routine x 2)     Status: None   Collection Time: 12/23/23  6:33 PM   Specimen: BLOOD LEFT WRIST  Result Value Ref Range Status   Specimen Description BLOOD LEFT WRIST  Final   Special Requests   Final    BOTTLES DRAWN AEROBIC AND ANAEROBIC Blood Culture results may not be optimal due to an inadequate volume of blood received in culture bottles   Culture   Final    NO GROWTH 5 DAYS Performed at [facility], [address] [place], [place] [address]    Report Status 12/28/2023 FINAL  Final  Urine Culture     Status: Abnormal   Collection Time: 12/23/23  6:35 PM   Specimen: Urine, Catheterized  Result Value Ref Range Status   Specimen Description URINE, CATHETERIZED  Final   Special Requests   Final    NONE Reflexed from [id number] Performed at [facility], [address], [place], [place] [address]    Culture MULTIPLE SPECIES PRESENT, SUGGEST RECOLLECTION (A)  Final   Report Status 12/25/2023 FINAL  Final  MRSA Next Gen by PCR, Nasal     Status: Abnormal   Collection Time: 12/23/23 10:39 PM   Specimen: Nasal Mucosa; Nasal Swab  Result Value Ref Range Status   MRSA by PCR Next Gen DETECTED (A) NOT DETECTED Final    Comment: RESULT CALLED TO, READ BACK BY AND VERIFIED WITH: D WOODY RN 12/24/2023 @ 0313 BY AB (NOTE) The GeneXpert MRSA  Assay (FDA approved for NASAL specimens only), is one component of a comprehensive MRSA colonization surveillance program. It is not intended to diagnose MRSA infection nor to guide or monitor treatment for MRSA infections. Test performance is not FDA approved in patients less than 71 years old. Performed at [facility], [address], [place], [place] [address]   Aerobic/Anaerobic Culture w Gram Stain (surgical/deep wound)     Status: None   Collection Time: 12/23/23 11:07 PM   Specimen: PATH Soft tissue resection  Result Value Ref Range Status   Specimen Description OTHER  Final   Special Requests SOFT  Final   Gram Stain NO WBC SEEN NO ORGANISMS SEEN   Final   Culture   Final    MODERATE BACTEROIDES FRAGILIS BETA LACTAMASE POSITIVE Performed at [facility], [address], [place], [place] [address]    Report Status 12/26/2023 FINAL  Final  Blood Culture (routine x 2)     Status: None (Preliminary result)   Collection Time: 12/24/23  8:26 AM   Specimen: BLOOD RIGHT ARM  Result Value Ref Range Status   Specimen Description BLOOD RIGHT ARM  Final   Special Requests   Final    AEROBIC BOTTLE ONLY Blood Culture results may not be optimal due to an inadequate volume of blood received in culture bottles   Culture   Final    NO GROWTH 4 DAYS Performed at [facility]  Lab, [address] [place], [place] [address]    Report Status PENDING  Incomplete    RADIOLOGY STUDIES/RESULTS: No results found.   LOS: 6 days   Jeoffrey Massed, MD  [facility]    To contact the attending provider between 7A-7P or the covering provider during after hours 7P-7A, please log into the web site [email or web address] and access using universal [place] password for that web site. If you do not have the password, please call the hospital operator.  12/29/2023, 11:59 AM

## 2023-12-30 ENCOUNTER — Other Ambulatory Visit (HOSPITAL_COMMUNITY): Payer: Self-pay

## 2023-12-30 DIAGNOSIS — E876 Hypokalemia: Secondary | ICD-10-CM | POA: Diagnosis not present

## 2023-12-30 DIAGNOSIS — L89153 Pressure ulcer of sacral region, stage 3: Secondary | ICD-10-CM | POA: Diagnosis not present

## 2023-12-30 DIAGNOSIS — A419 Sepsis, unspecified organism: Secondary | ICD-10-CM | POA: Diagnosis not present

## 2023-12-30 DIAGNOSIS — N179 Acute kidney failure, unspecified: Secondary | ICD-10-CM | POA: Diagnosis not present

## 2023-12-30 MED ORDER — AMOXICILLIN-POT CLAVULANATE 875-125 MG PO TABS
1.0000 | ORAL_TABLET | Freq: Two times a day (BID) | ORAL | 0 refills | Status: AC
  Filled 2023-12-30: qty 16, 8d supply, fill #0

## 2023-12-30 MED ORDER — MIDODRINE HCL 5 MG PO TABS
10.0000 mg | ORAL_TABLET | Freq: Three times a day (TID) | ORAL | Status: DC
  Administered 2023-12-30 (×2): 10 mg via ORAL
  Filled 2023-12-30 (×2): qty 2

## 2023-12-30 MED ORDER — MIDODRINE HCL 10 MG PO TABS
10.0000 mg | ORAL_TABLET | Freq: Three times a day (TID) | ORAL | 0 refills | Status: DC
  Filled 2023-12-30: qty 90, 30d supply, fill #0

## 2023-12-30 NOTE — Progress Notes (Signed)
 CVAD removed per protocol per MD order. Manual pressure applied for 3 mins. Vaseline gauze, gauze, and Tegaderm applied over insertion site. No bleeding or swelling noted. Instructed patient to remain in bed for thirty mins. Educated patient about S/S of infection and when to call MD; no heavy lifting or pressure on R side for 24 hours; keep dressing dry and intact for 24 hours. Pt verbalized comprehension.

## 2023-12-30 NOTE — Progress Notes (Signed)
 Patient discharge for home. Discharge education done with sister and spouse in attendance, with emphasis on medication and follow up appointments. PICC removed by IV access team and at the time of note no signs of bleeding observed. Patient to be transported home with sister and her spouse in attendance around 6:25 pm when he is ready to go.

## 2023-12-30 NOTE — Plan of Care (Signed)

## 2023-12-30 NOTE — Progress Notes (Signed)
 Patient left unit in wheelchair accompanied by staff x 2.

## 2023-12-30 NOTE — Plan of Care (Signed)

## 2023-12-30 NOTE — TOC Transition Note (Signed)
 Transition of Care [facility]) - Discharge Note   Patient Details  Name: Ryleigh Buenger MRN: [id number] Date of Birth: 08/07/1966  Transition of [facility]) CM/SW Contact:  Lockie Pares, RN Phone Number: 12/30/2023, 5:07 PM   Clinical Narrative:     Patient is discharging with sister, she declined HH services. He was ordered a wheelchair yesterday, it is not in the room. Called Jermaine from [facility] [employer], he will deliver it to home this evening.   Final next level of care: Home/Self Care     Patient Goals and CMS Choice     Choice offered to / list presented to : Patient      Discharge Placement             Home with sister self care          Discharge Plan and Services Additional resources added to the After Visit Summary for                  DME Arranged: Wheelchair manual DME Agency: [employer] Date DME Agency Contacted: 12/29/23   Representative spoke with at DME Agency: Vaughan Basta            Social Drivers of Health (SDOH) Interventions SDOH Screenings   Food Insecurity: No Food Insecurity (12/24/2023)  Housing: Low Risk  (12/24/2023)  Transportation Needs: No Transportation Needs (12/24/2023)  Utilities: Not At Risk (12/24/2023)  Depression (PHQ2-9): Low Risk  (12/09/2021)  Financial Resource Strain: Low Risk  (07/29/2023)   Received from [facility]  Tobacco Use: Low Risk  (12/23/2023)     Readmission Risk Interventions    08/10/2023    3:37 PM  Readmission Risk Prevention Plan  Transportation Screening Complete  PCP or Specialist Appt within 3-5 Days Complete  Social Work Consult for Recovery Care Planning/Counseling Complete  Palliative Care Screening Not Applicable  Medication Review Oceanographer) Complete

## 2023-12-30 NOTE — Discharge Summary (Signed)
 PATIENT DETAILS Name: Jusitn Salsgiver Age: 58 y.o. Sex: male Date of Birth: [birth year] MRN: [id number]. Admitting Physician: Rozann Lesches, MD WUJ:[id number], Duane Lope, MD  Admit Date: 12/23/2023 Discharge date: 12/30/2023  Recommendations for Outpatient Follow-up:  Follow up with PCP in 1-2 weeks Please obtain CMP/CBC in one week  Admitted From:  Home  Disposition: Home (per CM note-family refused home health)   Discharge Condition: good  CODE STATUS:   Code Status: Full Code   Diet recommendation:  Diet Order             Diet - low sodium heart healthy           Diet regular Room service appropriate? Yes with Assist; Fluid consistency: Thin  Diet effective now                    Brief Summary: Patient is a 58 y.o.  male with history of spina bifida-s/p urostomy/colostomy/neurogenic bladder-chronic sacral decubitus ulcer-presented with septic shock secondary to necrotizing skin/soft tissue infection involving his gluteal wounds.  Initially admitted to the ICU-required pressors-evaluated by GI-underwent irrigation/debridement on 3/22-stabilized and transferred to [facility].   Significant events: 3/21>> admit to  ICU-septic shock-infected sacral decubitus wounds. 3/24>> transferred to [facility]   Significant studies: 3/21>> CT pelvis: Chronic left decubitus ulcer with worsening erosions involving ischium/pubic rami and extension into the left hip-concerning for acute on chronic osteomyelitis.   Significant microbiology data: 3/21>> surgical wound/path: Moderate Bacteroides fragilis 3/21>> blood culture: No growth   Procedures: 3/22>> irrigation/debridement of necrotizing soft tissue infection.   Consults: PCCM General surgery  Brief Hospital Course: Septic shock secondary to necrotizing soft tissue infection-in the setting of sacral decubitus ulceration stage IV/chronic osteomyelitis of ischial tuberosity/inferior pubic ramus. Sepsis visit has resolved S/p  debridement on 3/22 Transitioned from IV Zosyn/vancomycin to Augmentin on 3/26-will plan for 14 weeks treatment in total-from day of debridement. Per CM-family is not interested in home health-sister who is a primary caregiver is a nurse-she will come to the hospital later today-and learn/get familiar about dressing changes. Follow-up with PCP Offload sacral area is much as we can.   Asymptomatic bacteriuria UTI ruled out-not present on admission.   AKI Likely hemodynamically mediated Creatinine has stabilized   Hypomagnesemia/hypokalemia Repleted.   Microcytic anemia Worsened due to acute illness-required 1 unit of PRBC on 3/23 Hb stable for the past several days Follow CBC periodically.   Prediabetes (A1c 6.3 on 3/22) Supportive care   HTN BP soft but stable-will continue with midodrine on discharge.  He is asymptomatic with borderline hypotension at times.  Follow-up with PCP to see if this can be titrated off in the outpatient setting.   History of spina bifid with neurogenic bladder-s/p urostomy/colostomy-wheelchair-bound Lives with sister and is dependent on her for ADLs.   Pressure Ulcer: Agree with assessment and plan as outlined below. Pressure Injury 12/24/23 Buttocks Right (Active)  12/24/23 0100  Location: Buttocks  Location Orientation: Right  Staging:   Wound Description (Comments):   Present on Admission: Yes  Dressing Type Gauze (Comment);ABD 12/29/23 1959     Pressure Injury 12/24/23 Buttocks Left (Active)  12/24/23 0100  Location: Buttocks  Location Orientation: Left  Staging:   Wound Description (Comments):   Present on Admission: Yes  Dressing Type Gauze (Comment);ABD 12/29/23 1959    Discharge Diagnoses:  Principal Problem:   Sepsis [facility])   Discharge Instructions:  Activity:  As tolerated with Full fall precautions use walker/cane & assistance as needed Discharge  Instructions     Call MD for:  extreme fatigue   Complete by: As  directed    Call MD for:  persistant dizziness or light-headedness   Complete by: As directed    Call MD for:  severe uncontrolled pain   Complete by: As directed    Diet - low sodium heart healthy   Complete by: As directed    Discharge instructions   Complete by: As directed    Follow with Primary MD  Marguarite Arbour, MD in 1-2 weeks  Please get a complete blood count and chemistry panel checked by your Primary MD at your next visit, and again as instructed by your Primary MD.  Get Medicines reviewed and adjusted: Please take all your medications with you for your next visit with your Primary MD  Laboratory/radiological data: Please request your Primary MD to go over all hospital tests and procedure/radiological results at the follow up, please ask your Primary MD to get all Hospital records sent to his/her office.  In some cases, they will be blood work, cultures and biopsy results pending at the time of your discharge. Please request that your primary care M.D. follows up on these results.  Also Note the following: If you experience worsening of your admission symptoms, develop shortness of breath, life threatening emergency, suicidal or homicidal thoughts you must seek medical attention immediately by calling 911 or calling your MD immediately  if symptoms less severe.  You must read complete instructions/literature along with all the possible adverse reactions/side effects for all the Medicines you take and that have been prescribed to you. Take any new Medicines after you have completely understood and accpet all the possible adverse reactions/side effects.   Do not drive when taking Pain medications or sleeping medications (Benzodaizepines)  Do not take more than prescribed Pain, Sleep and Anxiety Medications. It is not advisable to combine anxiety,sleep and pain medications without talking with your primary care practitioner  Special Instructions: If you have smoked or  chewed Tobacco  in the last 2 yrs please stop smoking, stop any regular Alcohol  and or any Recreational drug use.  Wear Seat belts while driving.  Please note: You were cared for by a hospitalist during your hospital stay. Once you are discharged, your primary care physician will handle any further medical issues. Please note that NO REFILLS for any discharge medications will be authorized once you are discharged, as it is imperative that you return to your primary care physician (or establish a relationship with a primary care physician if you do not have one) for your post hospital discharge needs so that they can reassess your need for medications and monitor your lab values.   Discharge wound care:   Complete by: As directed    Saline moist to moist gauze packing to bilateral ischial tuberosity wounds, cover with ABD pads, secure with tape. Change BID   Increase activity slowly   Complete by: As directed       Allergies as of 12/30/2023       Reactions   Morphine And Codeine Nausea And Vomiting   Metrizamide    Sulfa Antibiotics Other (See Comments)   Reaction: unknown   Ivp Dye [iodinated Contrast Media] Hives   Latex Rash        Medication List     STOP taking these medications    cephALEXin 500 MG capsule Commonly known as: KEFLEX   hydrochlorothiazide 25 MG tablet Commonly known as: HYDRODIURIL  potassium chloride SA 20 MEQ tablet Commonly known as: KLOR-CON M   tamsulosin 0.4 MG Caps capsule Commonly known as: FLOMAX       TAKE these medications    amoxicillin-clavulanate 875-125 MG tablet Commonly known as: AUGMENTIN Take 1 tablet by mouth every 12 (twelve) hours for 8 days.   AZO CRANBERRY PO Take 1 tablet by mouth daily.   CENTRUM MEN PO Take 1 tablet by mouth daily.   dicyclomine 20 MG tablet Commonly known as: BENTYL Take 20 mg by mouth 2 (two) times daily.   midodrine 10 MG tablet Commonly known as: PROAMATINE Take 1 tablet (10 mg  total) by mouth 3 (three) times daily with meals.   pantoprazole 40 MG tablet Commonly known as: PROTONIX Take 1 tablet (40 mg total) by mouth daily.   QUEtiapine 25 MG tablet Commonly known as: SEROQUEL Take 25 mg by mouth at bedtime.   rosuvastatin 10 MG tablet Commonly known as: CRESTOR Take 10 mg by mouth at bedtime.   sucralfate 1 g tablet Commonly known as: CARAFATE Take 1 g by mouth 3 (three) times daily.   tiZANidine 4 MG tablet Commonly known as: ZANAFLEX Take 4 mg by mouth every 8 (eight) hours as needed for muscle spasms.               Durable Medical Equipment  (From admission, onward)           Start     Ordered   12/29/23 1234  For home use only DME lightweight manual wheelchair with seat cushion  Once       Comments: Patient suffers from spina bifida which impairs their ability to perform daily activities like bathing, dressing, feeding, grooming, and toileting in the home.  A cane, crutch, or walker will not resolve  issue with performing activities of daily living. A wheelchair will allow patient to safely perform daily activities. Patient is not able to propel themselves in the home using a standard weight wheelchair due to arm weakness, endurance, and general weakness. Patient can self propel in the lightweight wheelchair. Length of need Lifetime. Accessories: elevating leg rests (ELRs), wheel locks, extensions and anti-tippers.   12/29/23 1234              Discharge Care Instructions  (From admission, onward)           Start     Ordered   12/30/23 0000  Discharge wound care:       Comments: Saline moist to moist gauze packing to bilateral ischial tuberosity wounds, cover with ABD pads, secure with tape. Change BID   12/30/23 0851            Follow-up Information     [facility]             . Call.   Why: Follow up for wound care Contact information: [address] [place] [address] [phone]        Marguarite Arbour, MD. Schedule an appointment as soon as possible for a visit in 1 week(s).   Specialty: Internal Medicine Contact information: [address] [facility] [place] [address] [phone]                Allergies  Allergen Reactions   Morphine And Codeine Nausea And Vomiting   Metrizamide    Sulfa Antibiotics Other (See Comments)    Reaction: unknown   Ivp Dye [  Iodinated Contrast Media] Hives   Latex Rash     Other Procedures/Studies: [place] EKG SITE RITE Result Date: 12/24/2023 If [facility] image not attached, placement could not be confirmed due to current cardiac rhythm.  CT PELVIS WO CONTRAST Result Date: 12/23/2023 CLINICAL DATA:  Large sacral ulcer, assess for possible deep space infection. EXAM: CT PELVIS WITHOUT CONTRAST TECHNIQUE: Multidetector CT imaging of the pelvis was performed following the standard protocol without intravenous contrast. RADIATION DOSE REDUCTION: This exam was performed according to the departmental dose-optimization program which includes automated exposure control, adjustment of the mA and/or kV according to patient size and/or use of iterative reconstruction technique. COMPARISON:  08/08/2023. FINDINGS: Urinary Tract: No abnormality is visualized. The urinary bladder is nondistended. Bowel: A right lower quadrant ostomy is noted. No acute abnormality is seen. Vascular/Lymphatic: Prominent lymph nodes are present along the iliac chain bilaterally, likely reactive. Significant vascular abnormality is seen. Reproductive:  The prostate gland is within normal limits. Other: There is diastasis of the rectus abdominus with a broad-based hernia. No abdominopelvic ascites. Pelvic floor laxity is noted. Musculoskeletal: Decubitus ulcerations are noted at the ischial tuberosities bilaterally. There is debris or packing material in the left decubitus ulceration. Ulcerations extend  into the inferior pubic rami regions bilaterally. Soft tissue thickening and small joint effusion with air are noted at the left hip joint hip there is chronic bony deformity of the inferior aspect of the sacrum. Stable bony deformity with erosions are noted at the ischial tuberosity on the right and inferior pubic rami bilaterally. Increased erosions and bony destruction are noted along the posterior aspect of the ischium on the left. IMPRESSION: 1. Chronic left decubitus ulcer with worsening erosions involving the ischium and inferior pubic ramus and extension into the left hip, concerning for acute on chronic osteomyelitis. 2. Chronic ischial decubitus ulcer on the right with stable bony erosions, likely reflecting chronic osteomyelitis. Electronically Signed   By: Thornell Sartorius M.D.   On: 12/23/2023 21:01   DG Chest Port 1 View Result Date: 12/23/2023 CLINICAL DATA:  Possible sepsis. EXAM: PORTABLE CHEST 1 VIEW COMPARISON:  08/08/2023. FINDINGS: The heart size and mediastinal contours are within normal limits. No consolidation, effusion, or pneumothorax. The bony structures are stable. IMPRESSION: No active disease. Electronically Signed   By: Thornell Sartorius M.D.   On: 12/23/2023 18:58     TODAY-DAY OF DISCHARGE:  Subjective:   [name] today has no headache,no chest abdominal pain,no new weakness tingling or numbness, feels much better wants to go home today.   Objective:   Blood pressure (!) 83/51, pulse 66, temperature 99.1 F (37.3 C), temperature source Oral, resp. rate (!) 22, height 4\' 6"  (1.372 m), weight 45.1 kg, SpO2 96%.  Intake/Output Summary (Last 24 hours) at 12/30/2023 0851 Last data filed at 12/29/2023 2008 Gross per 24 hour  Intake --  Output 1250 ml  Net -1250 ml   Filed Weights   12/28/23 0500 12/29/23 0500 12/30/23 0500  Weight: 51.7 kg 46.7 kg 45.1 kg    Exam: Awake Alert, Oriented *3, No new F.N deficits, Normal affect [place].AT,PERRAL Supple Neck,No JVD,  No cervical lymphadenopathy appriciated.  Symmetrical Chest wall movement, Good air movement bilaterally, CTAB RRR,No Gallops,Rubs or new Murmurs, No Parasternal Heave +ve B.Sounds, Abd Soft, Non tender, No organomegaly appriciated, No rebound -guarding or rigidity. No Cyanosis, Clubbing or edema, No new Rash or bruise   PERTINENT RADIOLOGIC STUDIES: No results found.   PERTINENT LAB RESULTS: CBC: Recent Labs  12/27/23 1107 12/28/23 0122 12/28/23 0505  WBC 10.2  --  10.1  HGB 8.7* 8.6* 8.4*  HCT 29.6* 28.5* 28.8*  PLT 598*  --  548*   CMET CMP     Component Value Date/Time   NA 140 12/29/2023 0400   K 3.9 12/29/2023 0400   CL 109 12/29/2023 0400   CO2 24 12/29/2023 0400   GLUCOSE 77 12/29/2023 0400   BUN 11 12/29/2023 0400   CREATININE 1.07 12/29/2023 0400   CALCIUM 7.8 (L) 12/29/2023 0400   PROT 4.7 (L) 12/26/2023 0239   ALBUMIN <1.5 (L) 12/26/2023 0239   AST 24 12/26/2023 0239   ALT 25 12/26/2023 0239   ALKPHOS 60 12/26/2023 0239   BILITOT 0.4 12/26/2023 0239   GFRNONAA >60 12/29/2023 0400    GFR Estimated Creatinine Clearance: 43.4 mL/min (by C-G formula based on SCr of 1.07 mg/dL). No results for input(s): "LIPASE", "AMYLASE" in the last 72 hours. No results for input(s): "CKTOTAL", "CKMB", "CKMBINDEX", "TROPONINI" in the last 72 hours. Invalid input(s): "POCBNP" No results for input(s): "DDIMER" in the last 72 hours. No results for input(s): "HGBA1C" in the last 72 hours. No results for input(s): "CHOL", "HDL", "LDLCALC", "TRIG", "CHOLHDL", "LDLDIRECT" in the last 72 hours. No results for input(s): "TSH", "T4TOTAL", "T3FREE", "THYROIDAB" in the last 72 hours.  Invalid input(s): "FREET3" No results for input(s): "VITAMINB12", "FOLATE", "FERRITIN", "TIBC", "IRON", "RETICCTPCT" in the last 72 hours. Coags: No results for input(s): "INR" in the last 72 hours.  Invalid input(s): "PT" Microbiology: Recent Results (from the past 240 hours)  Blood Culture  (routine x 2)     Status: None   Collection Time: 12/23/23  6:33 PM   Specimen: BLOOD LEFT WRIST  Result Value Ref Range Status   Specimen Description BLOOD LEFT WRIST  Final   Special Requests   Final    BOTTLES DRAWN AEROBIC AND ANAEROBIC Blood Culture results may not be optimal due to an inadequate volume of blood received in culture bottles   Culture   Final    NO GROWTH 5 DAYS Performed at [facility], [address] [place], [place] [address]    Report Status 12/28/2023 FINAL  Final  Urine Culture     Status: Abnormal   Collection Time: 12/23/23  6:35 PM   Specimen: Urine, Catheterized  Result Value Ref Range Status   Specimen Description URINE, CATHETERIZED  Final   Special Requests   Final    NONE Reflexed from [id number] Performed at [facility], [address] [place], [place] [address]    Culture MULTIPLE SPECIES PRESENT, SUGGEST RECOLLECTION (A)  Final   Report Status 12/25/2023 FINAL  Final  MRSA Next Gen by PCR, Nasal     Status: Abnormal   Collection Time: 12/23/23 10:39 PM   Specimen: Nasal Mucosa; Nasal Swab  Result Value Ref Range Status   MRSA by PCR Next Gen DETECTED (A) NOT DETECTED Final    Comment: RESULT CALLED TO, READ BACK BY AND VERIFIED WITH: D WOODY RN 12/24/2023 @ 0313 BY AB (NOTE) The GeneXpert MRSA Assay (FDA approved for NASAL specimens only), is one component of a comprehensive MRSA colonization surveillance program. It is not intended to diagnose MRSA infection nor to guide or monitor treatment for MRSA infections. Test performance is not FDA approved in patients less than 80 years old. Performed at [facility], [address], [place], [place] [address]   Aerobic/Anaerobic Culture w Gram Stain (surgical/deep  wound)     Status: None   Collection Time: 12/23/23 11:07 PM   Specimen: PATH Soft tissue resection  Result Value Ref Range Status   Specimen Description OTHER  Final   Special Requests SOFT  Final   Gram Stain NO  WBC SEEN NO ORGANISMS SEEN   Final   Culture   Final    MODERATE BACTEROIDES FRAGILIS BETA LACTAMASE POSITIVE Performed at [facility], [address], [place], [place] [address]    Report Status 12/26/2023 FINAL  Final  Blood Culture (routine x 2)     Status: None   Collection Time: 12/24/23  8:26 AM   Specimen: BLOOD RIGHT ARM  Result Value Ref Range Status   Specimen Description BLOOD RIGHT ARM  Final   Special Requests   Final    AEROBIC BOTTLE ONLY Blood Culture results may not be optimal due to an inadequate volume of blood received in culture bottles   Culture   Final    NO GROWTH 5 DAYS Performed at [facility], [address], [place], [place] [address]    Report Status 12/29/2023 FINAL  Final    FURTHER DISCHARGE INSTRUCTIONS:  Get Medicines reviewed and adjusted: Please take all your medications with you for your next visit with your Primary MD  Laboratory/radiological data: Please request your Primary MD to go over all hospital tests and procedure/radiological results at the follow up, please ask your Primary MD to get all Hospital records sent to his/her office.  In some cases, they will be blood work, cultures and biopsy results pending at the time of your discharge. Please request that your primary care M.D. goes through all the records of your hospital data and follows up on these results.  Also Note the following: If you experience worsening of your admission symptoms, develop shortness of breath, life threatening emergency, suicidal or homicidal thoughts you must seek medical attention immediately by calling 911 or calling your MD immediately  if symptoms less severe.  You must read complete instructions/literature along with all the possible adverse reactions/side effects for all the Medicines you take and that have been prescribed to you. Take any new Medicines after you have completely understood and accpet all the possible adverse reactions/side  effects.   Do not drive when taking Pain medications or sleeping medications (Benzodaizepines)  Do not take more than prescribed Pain, Sleep and Anxiety Medications. It is not advisable to combine anxiety,sleep and pain medications without talking with your primary care practitioner  Special Instructions: If you have smoked or chewed Tobacco  in the last 2 yrs please stop smoking, stop any regular Alcohol  and or any Recreational drug use.  Wear Seat belts while driving.  Please note: You were cared for by a hospitalist during your hospital stay. Once you are discharged, your primary care physician will handle any further medical issues. Please note that NO REFILLS for any discharge medications will be authorized once you are discharged, as it is imperative that you return to your primary care physician (or establish a relationship with a primary care physician if you do not have one) for your post hospital discharge needs so that they can reassess your need for medications and monitor your lab values.  Total Time spent coordinating discharge including counseling, education and face to face time equals greater than 30 minutes.  SignedJeoffrey Massed 12/30/2023 8:51 AM

## 2024-01-18 ENCOUNTER — Encounter (HOSPITAL_BASED_OUTPATIENT_CLINIC_OR_DEPARTMENT_OTHER): Payer: 59 | Attending: General Surgery | Admitting: General Surgery

## 2024-01-31 ENCOUNTER — Other Ambulatory Visit: Payer: Self-pay

## 2024-01-31 ENCOUNTER — Emergency Department (HOSPITAL_COMMUNITY)

## 2024-01-31 ENCOUNTER — Inpatient Hospital Stay (HOSPITAL_COMMUNITY)
Admission: EM | Admit: 2024-01-31 | Discharge: 2024-02-14 | DRG: 871 | Disposition: A | Discharge status: Hospice | Attending: Family Medicine | Admitting: Family Medicine

## 2024-01-31 ENCOUNTER — Encounter (HOSPITAL_COMMUNITY): Payer: Self-pay

## 2024-01-31 DIAGNOSIS — E876 Hypokalemia: Secondary | ICD-10-CM | POA: Diagnosis not present

## 2024-01-31 DIAGNOSIS — R6520 Severe sepsis without septic shock: Secondary | ICD-10-CM | POA: Diagnosis present

## 2024-01-31 DIAGNOSIS — R7303 Prediabetes: Secondary | ICD-10-CM | POA: Diagnosis present

## 2024-01-31 DIAGNOSIS — Z515 Encounter for palliative care: Secondary | ICD-10-CM

## 2024-01-31 DIAGNOSIS — N319 Neuromuscular dysfunction of bladder, unspecified: Secondary | ICD-10-CM | POA: Diagnosis present

## 2024-01-31 DIAGNOSIS — I10 Essential (primary) hypertension: Secondary | ICD-10-CM | POA: Diagnosis present

## 2024-01-31 DIAGNOSIS — L89310 Pressure ulcer of right buttock, unstageable: Secondary | ICD-10-CM | POA: Diagnosis present

## 2024-01-31 DIAGNOSIS — M8618 Other acute osteomyelitis, other site: Secondary | ICD-10-CM | POA: Diagnosis present

## 2024-01-31 DIAGNOSIS — Z91040 Latex allergy status: Secondary | ICD-10-CM

## 2024-01-31 DIAGNOSIS — Z9889 Other specified postprocedural states: Secondary | ICD-10-CM

## 2024-01-31 DIAGNOSIS — Q0703 Arnold-Chiari syndrome with spina bifida and hydrocephalus: Secondary | ICD-10-CM

## 2024-01-31 DIAGNOSIS — Z66 Do not resuscitate: Secondary | ICD-10-CM | POA: Diagnosis not present

## 2024-01-31 DIAGNOSIS — A419 Sepsis, unspecified organism: Principal | ICD-10-CM | POA: Diagnosis present

## 2024-01-31 DIAGNOSIS — M009 Pyogenic arthritis, unspecified: Secondary | ICD-10-CM | POA: Diagnosis present

## 2024-01-31 DIAGNOSIS — E1122 Type 2 diabetes mellitus with diabetic chronic kidney disease: Secondary | ICD-10-CM | POA: Diagnosis present

## 2024-01-31 DIAGNOSIS — I9589 Other hypotension: Secondary | ICD-10-CM | POA: Diagnosis present

## 2024-01-31 DIAGNOSIS — G9389 Other specified disorders of brain: Secondary | ICD-10-CM | POA: Diagnosis present

## 2024-01-31 DIAGNOSIS — N179 Acute kidney failure, unspecified: Secondary | ICD-10-CM | POA: Diagnosis present

## 2024-01-31 DIAGNOSIS — M86659 Other chronic osteomyelitis, unspecified thigh: Secondary | ICD-10-CM

## 2024-01-31 DIAGNOSIS — N182 Chronic kidney disease, stage 2 (mild): Secondary | ICD-10-CM | POA: Diagnosis present

## 2024-01-31 DIAGNOSIS — Z79899 Other long term (current) drug therapy: Secondary | ICD-10-CM

## 2024-01-31 DIAGNOSIS — G8220 Paraplegia, unspecified: Secondary | ICD-10-CM | POA: Diagnosis present

## 2024-01-31 DIAGNOSIS — F411 Generalized anxiety disorder: Secondary | ICD-10-CM | POA: Diagnosis present

## 2024-01-31 DIAGNOSIS — Z8719 Personal history of other diseases of the digestive system: Secondary | ICD-10-CM

## 2024-01-31 DIAGNOSIS — E860 Dehydration: Secondary | ICD-10-CM | POA: Diagnosis present

## 2024-01-31 DIAGNOSIS — E785 Hyperlipidemia, unspecified: Secondary | ICD-10-CM | POA: Diagnosis present

## 2024-01-31 DIAGNOSIS — Z978 Presence of other specified devices: Secondary | ICD-10-CM

## 2024-01-31 DIAGNOSIS — D509 Iron deficiency anemia, unspecified: Secondary | ICD-10-CM | POA: Diagnosis present

## 2024-01-31 DIAGNOSIS — N189 Chronic kidney disease, unspecified: Secondary | ICD-10-CM | POA: Diagnosis present

## 2024-01-31 DIAGNOSIS — Z87728 Personal history of other specified (corrected) congenital malformations of nervous system and sense organs: Secondary | ICD-10-CM

## 2024-01-31 DIAGNOSIS — H5462 Unqualified visual loss, left eye, normal vision right eye: Secondary | ICD-10-CM | POA: Diagnosis present

## 2024-01-31 DIAGNOSIS — Z87442 Personal history of urinary calculi: Secondary | ICD-10-CM

## 2024-01-31 DIAGNOSIS — Z982 Presence of cerebrospinal fluid drainage device: Secondary | ICD-10-CM

## 2024-01-31 DIAGNOSIS — G4710 Hypersomnia, unspecified: Secondary | ICD-10-CM | POA: Diagnosis not present

## 2024-01-31 DIAGNOSIS — Z833 Family history of diabetes mellitus: Secondary | ICD-10-CM

## 2024-01-31 DIAGNOSIS — D649 Anemia, unspecified: Secondary | ICD-10-CM | POA: Diagnosis present

## 2024-01-31 DIAGNOSIS — H9190 Unspecified hearing loss, unspecified ear: Secondary | ICD-10-CM | POA: Diagnosis present

## 2024-01-31 DIAGNOSIS — Z936 Other artificial openings of urinary tract status: Secondary | ICD-10-CM

## 2024-01-31 DIAGNOSIS — Z993 Dependence on wheelchair: Secondary | ICD-10-CM

## 2024-01-31 DIAGNOSIS — G40909 Epilepsy, unspecified, not intractable, without status epilepticus: Secondary | ICD-10-CM | POA: Diagnosis present

## 2024-01-31 DIAGNOSIS — D696 Thrombocytopenia, unspecified: Secondary | ICD-10-CM | POA: Diagnosis present

## 2024-01-31 DIAGNOSIS — G935 Compression of brain: Secondary | ICD-10-CM | POA: Diagnosis present

## 2024-01-31 DIAGNOSIS — F79 Unspecified intellectual disabilities: Secondary | ICD-10-CM | POA: Diagnosis present

## 2024-01-31 DIAGNOSIS — L89154 Pressure ulcer of sacral region, stage 4: Secondary | ICD-10-CM | POA: Diagnosis present

## 2024-01-31 DIAGNOSIS — R531 Weakness: Principal | ICD-10-CM

## 2024-01-31 DIAGNOSIS — R7982 Elevated C-reactive protein (CRP): Secondary | ICD-10-CM | POA: Diagnosis present

## 2024-01-31 DIAGNOSIS — E870 Hyperosmolality and hypernatremia: Secondary | ICD-10-CM | POA: Diagnosis present

## 2024-01-31 DIAGNOSIS — E871 Hypo-osmolality and hyponatremia: Secondary | ICD-10-CM | POA: Diagnosis present

## 2024-01-31 DIAGNOSIS — E875 Hyperkalemia: Secondary | ICD-10-CM | POA: Diagnosis present

## 2024-01-31 DIAGNOSIS — L89320 Pressure ulcer of left buttock, unstageable: Secondary | ICD-10-CM | POA: Diagnosis present

## 2024-01-31 DIAGNOSIS — R9431 Abnormal electrocardiogram [ECG] [EKG]: Secondary | ICD-10-CM | POA: Diagnosis present

## 2024-01-31 DIAGNOSIS — E872 Acidosis, unspecified: Secondary | ICD-10-CM | POA: Diagnosis present

## 2024-01-31 DIAGNOSIS — Z91041 Radiographic dye allergy status: Secondary | ICD-10-CM

## 2024-01-31 DIAGNOSIS — I129 Hypertensive chronic kidney disease with stage 1 through stage 4 chronic kidney disease, or unspecified chronic kidney disease: Secondary | ICD-10-CM | POA: Diagnosis present

## 2024-01-31 DIAGNOSIS — H6121 Impacted cerumen, right ear: Secondary | ICD-10-CM | POA: Diagnosis present

## 2024-01-31 DIAGNOSIS — R1312 Dysphagia, oropharyngeal phase: Secondary | ICD-10-CM | POA: Diagnosis present

## 2024-01-31 DIAGNOSIS — M4628 Osteomyelitis of vertebra, sacral and sacrococcygeal region: Secondary | ICD-10-CM | POA: Diagnosis present

## 2024-01-31 DIAGNOSIS — Z933 Colostomy status: Secondary | ICD-10-CM

## 2024-01-31 NOTE — ED Triage Notes (Signed)
 Pt BIBEMS from came to ED c/o increased generalized weakness and lethargy for the 2-3 days as per family, also concerned of sacral wound.

## 2024-01-31 NOTE — ED Provider Notes (Signed)
 [place] [facility]GENCY DEPARTMENT AT [facility] Provider Note   CSN: [id number] Arrival date & time: 01/31/24  2146     History  Chief Complaint  Patient presents with   Fatigue   Weakness   Wound Check    [name] is a 58 y.o. male.  Patient with past medical history significant for spina bifida, ileostomy, suprapubic catheter, necrotizing skin/soft tissue infection in the gluteal wounds debrided in late March of this year, discharged on Augmentin  presents to the emergency department complaining of weakness for the past 3 days with worsening sacral wounds.  Patient states that due to his ostomy bags he is always laying on his buttocks.  He states that for the past 3 days he has felt tired and has not had his normal amount of energy.  He denies nausea, vomiting, abdominal pain, chest pain, shortness of breath.  Wounds are cared for at home by the patient's sister.  He denies fevers at home but is tachycardic upon arrival.   Weakness Wound Check       Home Medications Prior to Admission medications   Medication Sig Start Date End Date Taking? Authorizing Provider  Cranberry-Vitamin C -Probiotic (AZO CRANBERRY PO) Take 1 tablet by mouth daily.   Yes [provider]  midodrine  (PROAMATINE ) 10 MG tablet Take 1 tablet (10 mg total) by mouth 3 (three) times daily with meals. 12/30/23  Yes Ghimire, Estil Heman, MD  Multiple Vitamins-Minerals (CENTRUM MEN PO) Take 1 tablet by mouth daily.    Yes [provider]  pantoprazole  (PROTONIX ) 40 MG tablet Take 1 tablet (40 mg total) by mouth daily. 04/29/19  Yes Sainani, Vivek J, MD  potassium chloride  SA (KLOR-CON  M) 20 MEQ tablet Take 20 mEq by mouth 2 (two) times daily. 01/31/24  Yes [provider]  QUEtiapine  (SEROQUEL ) 25 MG tablet Take 25 mg by mouth at bedtime. 07/29/23 01/30/25 Yes [provider]  rosuvastatin  (CRESTOR ) 10 MG tablet Take 10 mg by mouth at bedtime. 10/20/19  Yes  [provider]  sucralfate  (CARAFATE ) 1 g tablet Take 1 g by mouth 3 (three) times daily. 12/14/19  Yes [provider]  tamsulosin (FLOMAX) 0.4 MG CAPS capsule Take 0.4 mg by mouth daily. 01/31/24  Yes [provider]  tiZANidine (ZANAFLEX) 4 MG tablet Take 4 mg by mouth every 8 (eight) hours as needed for muscle spasms. 07/29/23  Yes [provider]  dicyclomine  (BENTYL ) 20 MG tablet Take 20 mg by mouth 2 (two) times daily.    [provider]      Allergies    Morphine and codeine, Metrizamide, Sulfa antibiotics, Ivp dye [iodinated contrast media], and Latex    Review of Systems   Review of Systems  Neurological:  Positive for weakness.    Physical Exam Updated Vital Signs BP (!) 91/57   Pulse 89   Temp 98.7 F (37.1 C) (Oral)   Resp (!) 21   Ht 4\' 6"  (1.372 m)   Wt 46 kg   SpO2 100%   BMI 24.45 kg/m  Physical Exam Vitals and nursing note reviewed.  Constitutional:      Appearance: He is well-developed. He is not diaphoretic.  HENT:     Head: Normocephalic and atraumatic.     Right Ear: External ear normal.     Left Ear: External ear normal.  Eyes:     General: No scleral icterus.    Conjunctiva/sclera: Conjunctivae normal.  Neck:     Trachea:  No tracheal deviation.  Cardiovascular:     Rate and Rhythm: Normal rate and regular rhythm.  Pulmonary:     Effort: Pulmonary effort is normal. No respiratory distress.     Breath sounds: Normal breath sounds. No stridor. No wheezing or rales.  Abdominal:     General: Bowel sounds are normal. There is no distension.     Palpations: Abdomen is soft.     Tenderness: There is no abdominal tenderness. There is no guarding or rebound.  Musculoskeletal:        General: No tenderness or deformity.     Cervical back: Neck supple.     Comments: Large decubitus ulcers stage III stage IV in the sacral region, there are at least 3 separate areas, foul-smelling drainage noted  Skin:     General: Skin is warm and dry.     Findings: No rash.  Neurological:     General: No focal deficit present.     Mental Status: He is alert.     Cranial Nerves: No dysarthria or facial asymmetry.     Motor: No abnormal muscle tone or seizure activity.  Psychiatric:        Mood and Affect: Mood normal.     ED Results / Procedures / Treatments   Labs (all labs ordered are listed, but only abnormal results are displayed) Labs Reviewed  COMPREHENSIVE METABOLIC PANEL WITH GFR - Abnormal; Notable for the following components:      Result Value   Sodium 131 (*)    Potassium 2.2 (*)    CO2 15 (*)    Glucose, Bld 114 (*)    BUN 32 (*)    Creatinine, Ser 1.98 (*)    Calcium  7.9 (*)    Albumin  <1.5 (*)    Alkaline Phosphatase 127 (*)    GFR, Estimated 39 (*)    All other components within normal limits  CBC WITH DIFFERENTIAL/PLATELET - Abnormal; Notable for the following components:   WBC 14.4 (*)    RBC 3.66 (*)    Hemoglobin 8.3 (*)    HCT 28.3 (*)    MCV 77.3 (*)    MCH 22.7 (*)    MCHC 29.3 (*)    RDW 21.1 (*)    Platelets 566 (*)    Neutro Abs 11.8 (*)    Abs Immature Granulocytes 0.16 (*)    All other components within normal limits  URINALYSIS, ROUTINE W REFLEX MICROSCOPIC - Abnormal; Notable for the following components:   Color, Urine AMBER (*)    APPearance CLOUDY (*)    Hgb urine dipstick SMALL (*)    Protein, ur 100 (*)    Leukocytes,Ua LARGE (*)    Bacteria, UA MANY (*)    All other components within normal limits  SEDIMENTATION RATE - Abnormal; Notable for the following components:   Sed Rate 139 (*)    All other components within normal limits  C-REACTIVE PROTEIN - Abnormal; Notable for the following components:   CRP 24.5 (*)    All other components within normal limits  CULTURE, BLOOD (ROUTINE X 2)  CULTURE, BLOOD (ROUTINE X 2) W REFLEX TO ID PANEL  I-STAT CG4 LACTIC ACID, ED  I-STAT CG4 LACTIC ACID, ED    EKG EKG Interpretation Date/Time:  Tuesday  January 31 2024 23:38:37 EDT Ventricular Rate:  97 PR Interval:  139 QRS Duration:  94 QT Interval:  471 QTC Calculation: 599 R Axis:   29  Text Interpretation: Sinus rhythm Probable  left atrial enlargement RSR' in V1 or V2, right VCD or RVH Prolonged QT interval When compared with ECG of 03/07/2021, Premature ventricular complexes are no longer present Confirmed by Alissa April ([id number]) on 02/01/2024 12:07:36 AM  Radiology CT ABDOMEN PELVIS WO CONTRAST Result Date: 01/31/2024 EXAM: CT ABDOMEN AND PELVIS WITHOUT CONTRAST 01/31/2024 11:19:26 PM TECHNIQUE: CT of the abdomen and pelvis was performed without the administration of intravenous contrast. Multiplanar reformatted images are provided for review. Automated exposure control, iterative reconstruction, and/or weight based adjustment of the mA/kV was utilized to reduce the radiation dose to as low as reasonably achievable. COMPARISON: CT pelvis dated 12/23/2023 and CT abdomen/pelvis dated 03/07/2021. CLINICAL HISTORY: Extensive sacral and buttock wounds, concern for possible deep infection. Chief complaints; Fatigue; Weakness; Wound Check. FINDINGS: LOWER CHEST: No acute abnormality. HEPATOBILIARY: The liver is unremarkable. Gallbladder is unremarkable. No biliary ductal dilatation. SPLEEN: No acute abnormality. PANCREAS: No acute abnormality. ADRENAL GLANDS: No acute abnormality. KIDNEYS, URETERS AND BLADDER: Fragmented staghorn right renal calculus measuring up to 18 mm in the right renal pelvis (image 27), chronic. Clustered left renal calculi measuring up to 13 mm (image 29), chronic. No evidence of hydronephrosis. No evidence of perinephric or periureteral stranding. Urinary bladder is unremarkable. GI AND BOWEL: Status post left hemicolectomy with left mid-abdominal colostomy. Stomach demonstrates no acute abnormality. There is no evidence of bowel obstruction. No evidence of appendicitis. PERITONEUM AND RETROPERITONEUM: No evidence of ascites. No free  air. Aorta is normal in caliber. LYMPH NODES: No evidence of lymphadenopathy. REPRODUCTIVE ORGANS: No acute abnormality. BONES AND SOFT TISSUES: Deep left gluteal ulcer with acute on chronic osteomyelitis involving the left inferior pubic ramus and ischium (image 58) and associated septic joint with fluid/gas in the left hip and progressive destruction of the left femoral head (image 57). Deep right gluteal ulcer with acute on chronic osteomyelitis involving the right inferior pubic ramus with new extension to the right greater trochanter (image 62). Chronic spinal dysraphism. IMPRESSION: 1. Bilateral deep gluteal ulcers with acute on chronic pelvic osteomyelitis and left hip septic joint, as described above, progressive. 2. Additional stable ancillary findings, as above. Electronically signed by: Zadie Herter MD 01/31/2024 11:40 PM EDT RP Workstation: [id number]    Procedures .Critical Care  Performed by: Elisa Guest, PA-C Authorized by: Elisa Guest, PA-C   Critical care provider statement:    Critical care time (minutes):  30   Critical care time was exclusive of:  Separately billable procedures and treating other patients   Critical care was necessary to treat or prevent imminent or life-threatening deterioration of the following conditions:  Sepsis (K+ 2.2)   Critical care was time spent personally by me on the following activities:  Development of treatment plan with patient or surrogate, discussions with consultants, evaluation of patient's response to treatment, examination of patient, ordering and review of laboratory studies, ordering and review of radiographic studies, ordering and performing treatments and interventions, pulse oximetry, re-evaluation of patient's condition and review of old charts   Care discussed with: admitting provider       Medications Ordered in ED Medications  vancomycin  (VANCOCIN ) IVPB 1000 mg/200 mL premix (1,000 mg Intravenous New Bag/Given  02/01/24 0141)  lactated ringers  infusion ( Intravenous New Bag/Given 02/01/24 0125)  lactated ringers  bolus 1,000 mL (1,000 mLs Intravenous New Bag/Given 02/01/24 0125)    And  lactated ringers  bolus 500 mL (has no administration in time range)  piperacillin -tazobactam (ZOSYN ) IVPB 3.375 g (0 g Intravenous Stopped 02/01/24 0148)  potassium chloride  SA (KLOR-CON  M) CR tablet 80 mEq (80 mEq Oral Given 02/01/24 0121)    ED Course/ Medical Decision Making/ A&P                                 Medical Decision Making Amount and/or Complexity of Data Reviewed Labs: ordered. Radiology: ordered.  Risk Prescription drug management. Decision regarding hospitalization.   This patient presents to the ED for concern of weakness and infection, this involves an extensive number of treatment options, and is a complaint that carries with it a high risk of complications and morbidity.  The differential diagnosis includes sepsis, worsening sacral wounds, arthritis, dehydration, others   Co morbidities that complicate the patient evaluation  Known sacral wounds requiring debridement, spina bifida, ileostomy jejunostomy, suprapubic catheter   Additional history obtained:  Additional history obtained from EMS External records from outside source obtained and reviewed including previous discharge summary, patient on Augmentin  due to wound infection/osteomyelitis   Lab Tests:  I Ordered, and personally interpreted labs.  The pertinent results include:  Leukocytosis with a white count of 14,400, hemoglobin at baseline at 8.3, potassium 2.2, creatinine 1.98 (baseline appears to be around 1), CRP 24.5   Imaging Studies ordered:  I ordered imaging studies including CT abdomen pelvis without contrast due to contrast allergy I independently visualized and interpreted imaging which showed  1. Bilateral deep gluteal ulcers with acute on chronic pelvic osteomyelitis and  left hip septic joint, as described  above, progressive.  2. Additional stable ancillary findings, as above.   I agree with the radiologist interpretation   Cardiac Monitoring: / EKG:  The patient was maintained on a cardiac monitor.  I personally viewed and interpreted the cardiac monitored which showed an underlying rhythm of: Sinus rhythm   Problem List / ED Course / Critical interventions / Medication management   I ordered medication including vancomycin , Zosyn  for antibiotics due to sepsis due to wound infection, potassium for hypokalemia, LR infusion for sepsis Reevaluation of the patient after these medicines showed that the patient stayed the same I have reviewed the patients home medicines and have made adjustments as needed   Consultations Obtained:  I requested consultation with the orthopedic surgeon, Dr. Bernard Brick,  and discussed lab and imaging findings as well as pertinent plan - they recommend: Admission to medicine, start antibiotics, plan to do formal consult in a.m.  I requested consultation with hospitalist, Dr. Sundil, who agrees to see the patient for admission   Test / Admission - Considered:  Patient with worsening infection in the buttock region, now with CT evidence of possible septic arthritis of the hip.  SIRS criteria met.  Source of infection present.  His code sepsis activated.  Antibiotics and fluid bolus initiated.  Patient also treated for hyperkalemia.  He declined any IV potassium at this time.  Treated with 80 mEq of oral potassium. Patient also has AKI.  Patient needs admission for continued evaluation, antibiotics, orthopedic consultation         Final Clinical Impression(s) / ED Diagnoses Final diagnoses:  Weakness  Pyogenic arthritis of hip, due to unspecified organism, unspecified laterality (HCC)  AKI (acute kidney injury) (HCC)  Hypokalemia  Chronic osteomyelitis of pelvic region, unspecified laterality [facility])    Rx / DC Orders ED Discharge Orders     None          Delories Fetter 02/01/24 0154  Alissa April, MD 02/01/24 [phone]

## 2024-01-31 NOTE — ED Notes (Signed)
 Phlebotomy unable to collect lab work, Charity fundraiser notified

## 2024-01-31 NOTE — ED Provider Notes (Incomplete)
 [place] [facility] DEPARTMENT AT [facility] Provider Note   CSN: [id number] Arrival date & time: 01/31/24  2146     History {Add pertinent medical, surgical, social history, OB history to HPI:1} Chief Complaint  Patient presents with  . Fatigue  . Weakness  . Wound Check    [name] is a 58 y.o. male.   Weakness Wound Check       Home Medications Prior to Admission medications   Medication Sig Start Date End Date Taking? Authorizing Provider  Cranberry-Vitamin C -Probiotic (AZO CRANBERRY PO) Take 1 tablet by mouth daily.   Yes [provider]  midodrine  (PROAMATINE ) 10 MG tablet Take 1 tablet (10 mg total) by mouth 3 (three) times daily with meals. 12/30/23  Yes Ghimire, Estil Heman, MD  Multiple Vitamins-Minerals (CENTRUM MEN PO) Take 1 tablet by mouth daily.    Yes [provider]  pantoprazole  (PROTONIX ) 40 MG tablet Take 1 tablet (40 mg total) by mouth daily. 04/29/19  Yes Sainani, Vivek J, MD  potassium chloride  SA (KLOR-CON  M) 20 MEQ tablet Take 20 mEq by mouth 2 (two) times daily. 01/31/24  Yes [provider]  QUEtiapine  (SEROQUEL ) 25 MG tablet Take 25 mg by mouth at bedtime. 07/29/23 01/30/25 Yes [provider]  rosuvastatin  (CRESTOR ) 10 MG tablet Take 10 mg by mouth at bedtime. 10/20/19  Yes [provider]  sucralfate  (CARAFATE ) 1 g tablet Take 1 g by mouth 3 (three) times daily. 12/14/19  Yes [provider]  tamsulosin (FLOMAX) 0.4 MG CAPS capsule Take 0.4 mg by mouth daily. 01/31/24  Yes [provider]  tiZANidine (ZANAFLEX) 4 MG tablet Take 4 mg by mouth every 8 (eight) hours as needed for muscle spasms. 07/29/23  Yes [provider]  dicyclomine  (BENTYL ) 20 MG tablet Take 20 mg by mouth 2 (two) times daily.    [provider]      Allergies    Morphine and codeine, Metrizamide, Sulfa antibiotics, Ivp dye [iodinated contrast media], and Latex    Review  of Systems   Review of Systems  Neurological:  Positive for weakness.    Physical Exam Updated Vital Signs BP 122/76   Pulse (!) 111   Temp 98.6 F (37 C) (Oral)   Resp 18   Ht 4\' 6"  (1.372 m)   Wt 46 kg   SpO2 100%   BMI 24.45 kg/m  Physical Exam  ED Results / Procedures / Treatments   Labs (all labs ordered are listed, but only abnormal results are displayed) Labs Reviewed  CULTURE, BLOOD (ROUTINE X 2)  CULTURE, BLOOD (ROUTINE X 2)  COMPREHENSIVE METABOLIC PANEL WITH GFR  CBC WITH DIFFERENTIAL/PLATELET  URINALYSIS, ROUTINE W REFLEX MICROSCOPIC  I-STAT CG4 LACTIC ACID, ED    EKG None  Radiology CT ABDOMEN PELVIS WO CONTRAST Result Date: 01/31/2024 EXAM: CT ABDOMEN AND PELVIS WITHOUT CONTRAST 01/31/2024 11:19:26 PM TECHNIQUE: CT of the abdomen and pelvis was performed without the administration of intravenous contrast. Multiplanar reformatted images are provided for review. Automated exposure control, iterative reconstruction, and/or weight based adjustment of the mA/kV was utilized to reduce the radiation dose to as low as reasonably achievable. COMPARISON: CT pelvis dated 12/23/2023 and CT abdomen/pelvis dated 03/07/2021. CLINICAL HISTORY: Extensive sacral and buttock wounds, concern for possible deep infection. Chief complaints; Fatigue; Weakness; Wound Check. FINDINGS: LOWER CHEST: No acute abnormality. HEPATOBILIARY: The liver is unremarkable. Gallbladder is unremarkable. No biliary ductal dilatation. SPLEEN: No acute abnormality. PANCREAS: No acute abnormality.  ADRENAL GLANDS: No acute abnormality. KIDNEYS, URETERS AND BLADDER: Fragmented staghorn right renal calculus measuring up to 18 mm in the right renal pelvis (image 27), chronic. Clustered left renal calculi measuring up to 13 mm (image 29), chronic. No evidence of hydronephrosis. No evidence of perinephric or periureteral stranding. Urinary bladder is unremarkable. GI AND BOWEL: Status post left hemicolectomy with  left mid-abdominal colostomy. Stomach demonstrates no acute abnormality. There is no evidence of bowel obstruction. No evidence of appendicitis. PERITONEUM AND RETROPERITONEUM: No evidence of ascites. No free air. Aorta is normal in caliber. LYMPH NODES: No evidence of lymphadenopathy. REPRODUCTIVE ORGANS: No acute abnormality. BONES AND SOFT TISSUES: Deep left gluteal ulcer with acute on chronic osteomyelitis involving the left inferior pubic ramus and ischium (image 58) and associated septic joint with fluid/gas in the left hip and progressive destruction of the left femoral head (image 57). Deep right gluteal ulcer with acute on chronic osteomyelitis involving the right inferior pubic ramus with new extension to the right greater trochanter (image 62). Chronic spinal dysraphism. IMPRESSION: 1. Bilateral deep gluteal ulcers with acute on chronic pelvic osteomyelitis and left hip septic joint, as described above, progressive. 2. Additional stable ancillary findings, as above. Electronically signed by: Zadie Herter MD 01/31/2024 11:40 PM EDT RP Workstation: [id number]    Procedures Procedures  {Document cardiac monitor, telemetry assessment procedure when appropriate:1}  Medications Ordered in ED Medications - No data to display  ED Course/ Medical Decision Making/ A&P   {   Click here for ABCD2, HEART and other calculatorsREFRESH Note before signing :1}                              Medical Decision Making Amount and/or Complexity of Data Reviewed Labs: ordered. Radiology: ordered.   ***  {Document critical care time when appropriate:1} {Document review of labs and clinical decision tools ie heart score, Chads2Vasc2 etc:1}  {Document your independent review of radiology images, and any outside records:1} {Document your discussion with family members, caretakers, and with consultants:1} {Document social determinants of health affecting pt's care:1} {Document your decision making why or  why not admission, treatments were needed:1} Final Clinical Impression(s) / ED Diagnoses Final diagnoses:  None    Rx / DC Orders ED Discharge Orders     None

## 2024-02-01 ENCOUNTER — Encounter (HOSPITAL_COMMUNITY): Payer: Self-pay | Admitting: Internal Medicine

## 2024-02-01 DIAGNOSIS — Z978 Presence of other specified devices: Secondary | ICD-10-CM

## 2024-02-01 DIAGNOSIS — M009 Pyogenic arthritis, unspecified: Secondary | ICD-10-CM | POA: Diagnosis present

## 2024-02-01 DIAGNOSIS — G935 Compression of brain: Secondary | ICD-10-CM | POA: Diagnosis present

## 2024-02-01 DIAGNOSIS — D509 Iron deficiency anemia, unspecified: Secondary | ICD-10-CM

## 2024-02-01 DIAGNOSIS — D649 Anemia, unspecified: Secondary | ICD-10-CM

## 2024-02-01 DIAGNOSIS — R4182 Altered mental status, unspecified: Secondary | ICD-10-CM | POA: Diagnosis not present

## 2024-02-01 DIAGNOSIS — Z9889 Other specified postprocedural states: Secondary | ICD-10-CM

## 2024-02-01 DIAGNOSIS — M8618 Other acute osteomyelitis, other site: Secondary | ICD-10-CM | POA: Diagnosis present

## 2024-02-01 DIAGNOSIS — N179 Acute kidney failure, unspecified: Secondary | ICD-10-CM | POA: Diagnosis present

## 2024-02-01 DIAGNOSIS — Z66 Do not resuscitate: Secondary | ICD-10-CM | POA: Diagnosis not present

## 2024-02-01 DIAGNOSIS — Z515 Encounter for palliative care: Secondary | ICD-10-CM | POA: Diagnosis not present

## 2024-02-01 DIAGNOSIS — E870 Hyperosmolality and hypernatremia: Secondary | ICD-10-CM | POA: Diagnosis present

## 2024-02-01 DIAGNOSIS — M4628 Osteomyelitis of vertebra, sacral and sacrococcygeal region: Secondary | ICD-10-CM | POA: Diagnosis present

## 2024-02-01 DIAGNOSIS — L89154 Pressure ulcer of sacral region, stage 4: Secondary | ICD-10-CM

## 2024-02-01 DIAGNOSIS — Z87728 Personal history of other specified (corrected) congenital malformations of nervous system and sense organs: Secondary | ICD-10-CM

## 2024-02-01 DIAGNOSIS — E1122 Type 2 diabetes mellitus with diabetic chronic kidney disease: Secondary | ICD-10-CM | POA: Diagnosis present

## 2024-02-01 DIAGNOSIS — I129 Hypertensive chronic kidney disease with stage 1 through stage 4 chronic kidney disease, or unspecified chronic kidney disease: Secondary | ICD-10-CM | POA: Diagnosis present

## 2024-02-01 DIAGNOSIS — E876 Hypokalemia: Secondary | ICD-10-CM

## 2024-02-01 DIAGNOSIS — E872 Acidosis, unspecified: Secondary | ICD-10-CM | POA: Diagnosis present

## 2024-02-01 DIAGNOSIS — Z933 Colostomy status: Secondary | ICD-10-CM | POA: Diagnosis not present

## 2024-02-01 DIAGNOSIS — I9589 Other hypotension: Secondary | ICD-10-CM

## 2024-02-01 DIAGNOSIS — A419 Sepsis, unspecified organism: Principal | ICD-10-CM

## 2024-02-01 DIAGNOSIS — R569 Unspecified convulsions: Secondary | ICD-10-CM | POA: Diagnosis not present

## 2024-02-01 DIAGNOSIS — Z7189 Other specified counseling: Secondary | ICD-10-CM | POA: Diagnosis not present

## 2024-02-01 DIAGNOSIS — L89310 Pressure ulcer of right buttock, unstageable: Secondary | ICD-10-CM | POA: Diagnosis present

## 2024-02-01 DIAGNOSIS — L89320 Pressure ulcer of left buttock, unstageable: Secondary | ICD-10-CM | POA: Diagnosis present

## 2024-02-01 DIAGNOSIS — G40909 Epilepsy, unspecified, not intractable, without status epilepticus: Secondary | ICD-10-CM | POA: Diagnosis present

## 2024-02-01 DIAGNOSIS — Q054 Unspecified spina bifida with hydrocephalus: Secondary | ICD-10-CM | POA: Diagnosis not present

## 2024-02-01 DIAGNOSIS — F411 Generalized anxiety disorder: Secondary | ICD-10-CM

## 2024-02-01 DIAGNOSIS — Z936 Other artificial openings of urinary tract status: Secondary | ICD-10-CM | POA: Diagnosis not present

## 2024-02-01 DIAGNOSIS — E871 Hypo-osmolality and hyponatremia: Secondary | ICD-10-CM | POA: Diagnosis present

## 2024-02-01 DIAGNOSIS — D696 Thrombocytopenia, unspecified: Secondary | ICD-10-CM | POA: Diagnosis present

## 2024-02-01 DIAGNOSIS — N189 Chronic kidney disease, unspecified: Secondary | ICD-10-CM

## 2024-02-01 DIAGNOSIS — M86659 Other chronic osteomyelitis, unspecified thigh: Secondary | ICD-10-CM | POA: Diagnosis not present

## 2024-02-01 DIAGNOSIS — Q0703 Arnold-Chiari syndrome with spina bifida and hydrocephalus: Secondary | ICD-10-CM | POA: Diagnosis not present

## 2024-02-01 DIAGNOSIS — N182 Chronic kidney disease, stage 2 (mild): Secondary | ICD-10-CM | POA: Insufficient documentation

## 2024-02-01 DIAGNOSIS — R410 Disorientation, unspecified: Secondary | ICD-10-CM | POA: Diagnosis not present

## 2024-02-01 DIAGNOSIS — G9340 Encephalopathy, unspecified: Secondary | ICD-10-CM | POA: Diagnosis not present

## 2024-02-01 DIAGNOSIS — I10 Essential (primary) hypertension: Secondary | ICD-10-CM

## 2024-02-01 DIAGNOSIS — G8220 Paraplegia, unspecified: Secondary | ICD-10-CM | POA: Diagnosis present

## 2024-02-01 DIAGNOSIS — L89159 Pressure ulcer of sacral region, unspecified stage: Secondary | ICD-10-CM | POA: Diagnosis not present

## 2024-02-01 LAB — COMPREHENSIVE METABOLIC PANEL WITH GFR
ALT: 12 U/L (ref 0–44)
ALT: 18 U/L (ref 0–44)
AST: 22 U/L (ref 15–41)
AST: 27 U/L (ref 15–41)
Albumin: 1.7 g/dL — ABNORMAL LOW (ref 3.5–5.0)
Albumin: <1.5 g/dL — ABNORMAL LOW (ref 3.5–5.0)
Alkaline Phosphatase: 127 U/L — ABNORMAL HIGH (ref 38–126)
Alkaline Phosphatase: 84 U/L (ref 38–126)
Anion gap: 14 (ref 5–15)
Anion gap: 8 (ref 5–15)
BUN: 27 mg/dL — ABNORMAL HIGH (ref 6–20)
BUN: 32 mg/dL — ABNORMAL HIGH (ref 6–20)
CO2: 15 mmol/L — ABNORMAL LOW (ref 22–32)
CO2: 17 mmol/L — ABNORMAL LOW (ref 22–32)
Calcium: 7.3 mg/dL — ABNORMAL LOW (ref 8.9–10.3)
Calcium: 7.9 mg/dL — ABNORMAL LOW (ref 8.9–10.3)
Chloride: 102 mmol/L (ref 98–111)
Chloride: 108 mmol/L (ref 98–111)
Creatinine, Ser: 1.78 mg/dL — ABNORMAL HIGH (ref 0.61–1.24)
Creatinine, Ser: 1.98 mg/dL — ABNORMAL HIGH (ref 0.61–1.24)
GFR, Estimated: 39 mL/min — ABNORMAL LOW (ref >60)
GFR, Estimated: 44 mL/min — ABNORMAL LOW (ref >60)
Glucose, Bld: 114 mg/dL — ABNORMAL HIGH (ref 70–99)
Glucose, Bld: 118 mg/dL — ABNORMAL HIGH (ref 70–99)
Potassium: 2.2 mmol/L — CL (ref 3.5–5.1)
Potassium: 2.7 mmol/L — CL (ref 3.5–5.1)
Sodium: 131 mmol/L — ABNORMAL LOW (ref 135–145)
Sodium: 133 mmol/L — ABNORMAL LOW (ref 135–145)
Total Bilirubin: 0.6 mg/dL (ref 0.0–1.2)
Total Bilirubin: 0.9 mg/dL (ref 0.0–1.2)
Total Protein: 5.4 g/dL — ABNORMAL LOW (ref 6.5–8.1)
Total Protein: 6.6 g/dL (ref 6.5–8.1)

## 2024-02-01 LAB — CBC WITH DIFFERENTIAL/PLATELET
Abs Immature Granulocytes: 0.16 10*3/uL — ABNORMAL HIGH (ref 0.00–0.07)
Basophils Absolute: 0 10*3/uL (ref 0.0–0.1)
Basophils Relative: 0 %
Eosinophils Absolute: 0 10*3/uL (ref 0.0–0.5)
Eosinophils Relative: 0 %
HCT: 28.3 % — ABNORMAL LOW (ref 39.0–52.0)
Hemoglobin: 8.3 g/dL — ABNORMAL LOW (ref 13.0–17.0)
Immature Granulocytes: 1 %
Lymphocytes Relative: 11 %
Lymphs Abs: 1.5 10*3/uL (ref 0.7–4.0)
MCH: 22.7 pg — ABNORMAL LOW (ref 26.0–34.0)
MCHC: 29.3 g/dL — ABNORMAL LOW (ref 30.0–36.0)
MCV: 77.3 fL — ABNORMAL LOW (ref 80.0–100.0)
Monocytes Absolute: 0.8 10*3/uL (ref 0.1–1.0)
Monocytes Relative: 6 %
Neutro Abs: 11.8 10*3/uL — ABNORMAL HIGH (ref 1.7–7.7)
Neutrophils Relative %: 82 %
Platelets: 566 10*3/uL — ABNORMAL HIGH (ref 150–400)
RBC: 3.66 MIL/uL — ABNORMAL LOW (ref 4.22–5.81)
RDW: 21.1 % — ABNORMAL HIGH (ref 11.5–15.5)
WBC: 14.4 10*3/uL — ABNORMAL HIGH (ref 4.0–10.5)
nRBC: 0 % (ref 0.0–0.2)

## 2024-02-01 LAB — RENAL FUNCTION PANEL
Albumin: 1.6 g/dL — ABNORMAL LOW (ref 3.5–5.0)
Anion gap: 10 (ref 5–15)
BUN: 22 mg/dL — ABNORMAL HIGH (ref 6–20)
CO2: 11 mmol/L — ABNORMAL LOW (ref 22–32)
Calcium: 7.7 mg/dL — ABNORMAL LOW (ref 8.9–10.3)
Chloride: 117 mmol/L — ABNORMAL HIGH (ref 98–111)
Creatinine, Ser: 1.52 mg/dL — ABNORMAL HIGH (ref 0.61–1.24)
GFR, Estimated: 53 mL/min — ABNORMAL LOW (ref >60)
Glucose, Bld: 144 mg/dL — ABNORMAL HIGH (ref 70–99)
Phosphorus: 2.1 mg/dL — ABNORMAL LOW (ref 2.5–4.6)
Potassium: 5 mmol/L (ref 3.5–5.1)
Sodium: 138 mmol/L (ref 135–145)

## 2024-02-01 LAB — URINALYSIS, ROUTINE W REFLEX MICROSCOPIC
Bilirubin Urine: NEGATIVE
Glucose, UA: NEGATIVE mg/dL
Ketones, ur: NEGATIVE mg/dL
Nitrite: NEGATIVE
Protein, ur: 100 mg/dL — AB
Specific Gravity, Urine: 1.008 (ref 1.005–1.030)
pH: 8 (ref 5.0–8.0)

## 2024-02-01 LAB — CBC
HCT: 20.3 % — ABNORMAL LOW (ref 39.0–52.0)
HCT: 37.6 % — ABNORMAL LOW (ref 39.0–52.0)
Hemoglobin: 5.9 g/dL — CL (ref 13.0–17.0)
Hemoglobin: 11.6 g/dL — ABNORMAL LOW (ref 13.0–17.0)
MCH: 22.7 pg — ABNORMAL LOW (ref 26.0–34.0)
MCH: 25.6 pg — ABNORMAL LOW (ref 26.0–34.0)
MCHC: 29.1 g/dL — ABNORMAL LOW (ref 30.0–36.0)
MCHC: 30.9 g/dL (ref 30.0–36.0)
MCV: 78.1 fL — ABNORMAL LOW (ref 80.0–100.0)
MCV: 82.8 fL (ref 80.0–100.0)
Platelets: 622 10*3/uL — ABNORMAL HIGH (ref 150–400)
Platelets: 379 10*3/uL (ref 150–400)
RBC: 2.6 MIL/uL — ABNORMAL LOW (ref 4.22–5.81)
RBC: 4.54 MIL/uL (ref 4.22–5.81)
RDW: 19.4 % — ABNORMAL HIGH (ref 11.5–15.5)
RDW: 17.9 % — ABNORMAL HIGH (ref 11.5–15.5)
WBC: 10 10*3/uL (ref 4.0–10.5)
WBC: 8 10*3/uL (ref 4.0–10.5)
nRBC: 0 % (ref 0.0–0.2)
nRBC: 0 % (ref 0.0–0.2)

## 2024-02-01 LAB — CORTISOL: Cortisol, Plasma: 19.3 ug/dL

## 2024-02-01 LAB — HEMOGLOBIN AND HEMATOCRIT, BLOOD
HCT: 19.1 % — ABNORMAL LOW (ref 39.0–52.0)
Hemoglobin: 5.5 g/dL — CL (ref 13.0–17.0)

## 2024-02-01 LAB — MRSA NEXT GEN BY PCR, NASAL: MRSA by PCR Next Gen: NOT DETECTED

## 2024-02-01 LAB — C-REACTIVE PROTEIN: CRP: 24.5 mg/dL — ABNORMAL HIGH (ref <1.0)

## 2024-02-01 LAB — T4, FREE: Free T4: 0.88 ng/dL (ref 0.61–1.12)

## 2024-02-01 LAB — PREPARE RBC (CROSSMATCH)

## 2024-02-01 LAB — I-STAT CG4 LACTIC ACID, ED: Lactic Acid, Venous: 1.5 mmol/L (ref 0.5–1.9)

## 2024-02-01 LAB — MAGNESIUM: Magnesium: 2 mg/dL (ref 1.7–2.4)

## 2024-02-01 LAB — SEDIMENTATION RATE: Sed Rate: 139 mm/h — ABNORMAL HIGH (ref 0–16)

## 2024-02-01 LAB — TSH: TSH: 0.305 u[IU]/mL — ABNORMAL LOW (ref 0.350–4.500)

## 2024-02-01 MED ORDER — ACETAMINOPHEN 650 MG RE SUPP: 650.0000 mg | Freq: Four times a day (QID) | RECTAL | Status: DC | PRN

## 2024-02-01 MED ORDER — ACETAMINOPHEN 325 MG PO TABS: 650.0000 mg | ORAL_TABLET | Freq: Four times a day (QID) | ORAL | Status: DC | PRN

## 2024-02-01 MED ORDER — ALBUMIN HUMAN 25 % IV SOLN
25.0000 g | INTRAVENOUS | Status: AC
  Administered 2024-02-01: 25 g via INTRAVENOUS
  Filled 2024-02-01: qty 100

## 2024-02-01 MED ORDER — PROCHLORPERAZINE EDISYLATE 10 MG/2ML IJ SOLN: 10.0000 mg | Freq: Four times a day (QID) | INTRAMUSCULAR | Status: DC | PRN

## 2024-02-01 MED ORDER — LACTATED RINGERS IV BOLUS (SEPSIS)
1000.0000 mL | Freq: Once | INTRAVENOUS | Status: AC
  Administered 2024-02-01: 1000 mL via INTRAVENOUS

## 2024-02-01 MED ORDER — SODIUM CHLORIDE 0.9% FLUSH: 3.0000 mL | Freq: Two times a day (BID) | INTRAVENOUS | Status: DC

## 2024-02-01 MED ORDER — SODIUM CHLORIDE 0.9 % IV SOLN: 250.0000 mL | INTRAVENOUS | Status: AC | PRN

## 2024-02-01 MED ORDER — POTASSIUM CHLORIDE CRYS ER 20 MEQ PO TBCR
40.0000 meq | EXTENDED_RELEASE_TABLET | Freq: Once | ORAL | Status: AC
  Administered 2024-02-01: 40 meq via ORAL
  Filled 2024-02-01: qty 2

## 2024-02-01 MED ORDER — SODIUM BICARBONATE 650 MG PO TABS
650.0000 mg | ORAL_TABLET | Freq: Three times a day (TID) | ORAL | Status: AC
  Administered 2024-02-01 (×3): 650 mg via ORAL
  Filled 2024-02-01 (×3): qty 1

## 2024-02-01 MED ORDER — SUCRALFATE 1 G PO TABS
1.0000 g | ORAL_TABLET | Freq: Three times a day (TID) | ORAL | Status: DC
  Administered 2024-02-01 – 2024-02-04 (×10): 1 g via ORAL
  Filled 2024-02-01 (×11): qty 1

## 2024-02-01 MED ORDER — VANCOMYCIN HCL IN DEXTROSE 1-5 GM/200ML-% IV SOLN
1000.0000 mg | Freq: Once | INTRAVENOUS | Status: AC
  Administered 2024-02-01: 1000 mg via INTRAVENOUS
  Filled 2024-02-01: qty 200

## 2024-02-01 MED ORDER — SODIUM CHLORIDE 0.9% IV SOLUTION: Freq: Once | INTRAVENOUS | Status: AC

## 2024-02-01 MED ORDER — LACTATED RINGERS IV BOLUS (SEPSIS)
500.0000 mL | Freq: Once | INTRAVENOUS | Status: AC
  Administered 2024-02-01: 500 mL via INTRAVENOUS

## 2024-02-01 MED ORDER — POTASSIUM CHLORIDE 10 MEQ/100ML IV SOLN: 10.0000 meq | INTRAVENOUS | Status: DC

## 2024-02-01 MED ORDER — VANCOMYCIN VARIABLE DOSE PER UNSTABLE RENAL FUNCTION (PHARMACIST DOSING): Status: DC

## 2024-02-01 MED ORDER — SODIUM CHLORIDE 0.9 % IV BOLUS: 1000.0000 mL | Freq: Once | INTRAVENOUS | Status: DC

## 2024-02-01 MED ORDER — SODIUM CHLORIDE 0.9% FLUSH: 3.0000 mL | INTRAVENOUS | Status: DC | PRN

## 2024-02-01 MED ORDER — ONDANSETRON HCL 4 MG PO TABS: 4.0000 mg | ORAL_TABLET | Freq: Four times a day (QID) | ORAL | Status: DC | PRN

## 2024-02-01 MED ORDER — LACTATED RINGERS IV SOLN: INTRAVENOUS | Status: AC

## 2024-02-01 MED ORDER — SODIUM CHLORIDE 0.9% FLUSH
3.0000 mL | Freq: Two times a day (BID) | INTRAVENOUS | Status: DC
  Administered 2024-02-01 – 2024-02-14 (×23): 3 mL via INTRAVENOUS

## 2024-02-01 MED ORDER — LACTATED RINGERS IV BOLUS
1000.0000 mL | Freq: Once | INTRAVENOUS | Status: AC
  Administered 2024-02-01: 1000 mL via INTRAVENOUS

## 2024-02-01 MED ORDER — PIPERACILLIN-TAZOBACTAM 3.375 G IVPB 30 MIN
3.3750 g | Freq: Once | INTRAVENOUS | Status: AC
  Administered 2024-02-01: 3.375 g via INTRAVENOUS
  Filled 2024-02-01: qty 50

## 2024-02-01 MED ORDER — TAMSULOSIN HCL 0.4 MG PO CAPS
0.4000 mg | ORAL_CAPSULE | Freq: Every day | ORAL | Status: DC
  Administered 2024-02-01: 0.4 mg via ORAL
  Filled 2024-02-01: qty 1

## 2024-02-01 MED ORDER — PANTOPRAZOLE SODIUM 40 MG PO TBEC
40.0000 mg | DELAYED_RELEASE_TABLET | Freq: Every day | ORAL | Status: DC
  Administered 2024-02-01 – 2024-02-04 (×4): 40 mg via ORAL
  Filled 2024-02-01 (×4): qty 1

## 2024-02-01 MED ORDER — PIPERACILLIN-TAZOBACTAM 3.375 G IVPB
3.3750 g | Freq: Three times a day (TID) | INTRAVENOUS | Status: DC
  Administered 2024-02-01 – 2024-02-02 (×5): 3.375 g via INTRAVENOUS
  Filled 2024-02-01 (×5): qty 50

## 2024-02-01 MED ORDER — POTASSIUM CHLORIDE CRYS ER 20 MEQ PO TBCR
40.0000 meq | EXTENDED_RELEASE_TABLET | ORAL | Status: AC
  Administered 2024-02-01 (×2): 40 meq via ORAL
  Filled 2024-02-01 (×2): qty 2

## 2024-02-01 MED ORDER — QUETIAPINE FUMARATE 25 MG PO TABS
25.0000 mg | ORAL_TABLET | Freq: Every day | ORAL | Status: DC
  Administered 2024-02-01 (×2): 25 mg via ORAL
  Filled 2024-02-01 (×2): qty 1

## 2024-02-01 MED ORDER — POTASSIUM CHLORIDE CRYS ER 20 MEQ PO TBCR: 20.0000 meq | EXTENDED_RELEASE_TABLET | Freq: Two times a day (BID) | ORAL | Status: DC

## 2024-02-01 MED ORDER — MEDIHONEY WOUND/BURN DRESSING EX PSTE
1.0000 | PASTE | Freq: Every day | CUTANEOUS | Status: DC
  Filled 2024-02-01: qty 44

## 2024-02-01 MED ORDER — SODIUM CHLORIDE 0.9 % IV SOLN
8.0000 mg/kg | Freq: Every day | INTRAVENOUS | Status: DC
  Administered 2024-02-01 – 2024-02-02 (×2): 350 mg via INTRAVENOUS
  Filled 2024-02-01 (×2): qty 7

## 2024-02-01 MED ORDER — POTASSIUM CHLORIDE 10 MEQ/100ML IV SOLN
10.0000 meq | INTRAVENOUS | Status: AC
  Administered 2024-02-01 (×4): 10 meq via INTRAVENOUS
  Filled 2024-02-01 (×4): qty 100

## 2024-02-01 MED ORDER — MIDODRINE HCL 5 MG PO TABS
10.0000 mg | ORAL_TABLET | Freq: Three times a day (TID) | ORAL | Status: DC
Start: 2024-02-01 — End: 2024-02-01
  Administered 2024-02-01: 10 mg via ORAL
  Filled 2024-02-01 (×2): qty 2

## 2024-02-01 MED ORDER — MIDODRINE HCL 5 MG PO TABS
15.0000 mg | ORAL_TABLET | Freq: Three times a day (TID) | ORAL | Status: DC
  Administered 2024-02-01 (×2): 15 mg via ORAL
  Filled 2024-02-01: qty 3

## 2024-02-01 MED ORDER — ONDANSETRON HCL 4 MG/2ML IJ SOLN: 4.0000 mg | Freq: Four times a day (QID) | INTRAMUSCULAR | Status: DC | PRN

## 2024-02-01 MED ORDER — POTASSIUM CHLORIDE CRYS ER 20 MEQ PO TBCR
80.0000 meq | EXTENDED_RELEASE_TABLET | Freq: Once | ORAL | Status: AC
  Administered 2024-02-01: 80 meq via ORAL
  Filled 2024-02-01: qty 4

## 2024-02-01 MED ORDER — POTASSIUM CHLORIDE CRYS ER 20 MEQ PO TBCR
40.0000 meq | EXTENDED_RELEASE_TABLET | Freq: Two times a day (BID) | ORAL | Status: DC
  Administered 2024-02-01: 40 meq via ORAL
  Filled 2024-02-01: qty 2

## 2024-02-01 MED ORDER — ROSUVASTATIN CALCIUM 5 MG PO TABS
10.0000 mg | ORAL_TABLET | Freq: Every day | ORAL | Status: DC
  Administered 2024-02-01 – 2024-02-03 (×3): 10 mg via ORAL
  Filled 2024-02-01 (×3): qty 2

## 2024-02-01 NOTE — Progress Notes (Signed)
 Pharmacy Antibiotic Note  [name] is a 58 y.o. male admitted on 01/31/2024 with sacral wound and concern for osteomyelitis. PMH significant for spina bifida, urostomy, colostomy, neurogenic bladder and HTN. Previously hospitalized for septic shock 2/2 necrotizing infection from decubitus ulcer where patient got debridement 3/22 with plans for antibiotics for 14 days (ended on 4/5). Pharmacy has been consulted for vancomycin  dosing.  Plan: Vancomycin  dose per levels with unstable renal function Vancomycin  level as needed F/u renal function, infectious work up and length of therapy  Height: 4\' 6"  (137.2 cm) Weight: 46 kg (101 lb 6.6 oz) IBW/kg (Calculated) : 36.2  Temp (24hrs), Avg:98.7 F (37.1 C), Min:98.6 F (37 C), Max:98.7 F (37.1 C)  Recent Labs  Lab 02/01/24 0010 02/01/24 0014  WBC 14.4*  --   CREATININE 1.98*  --   LATICACIDVEN  --  1.5    Estimated Creatinine Clearance: 23.6 mL/min (A) (by C-G formula based on SCr of 1.98 mg/dL (H)).    Allergies  Allergen Reactions   Morphine And Codeine Nausea And Vomiting   Metrizamide Hives   Sulfa Antibiotics Other (See Comments)    Reaction: unknown   Ivp Dye [Iodinated Contrast Media] Hives   Latex Rash    Antimicrobials this admission: Zosyn  4/30 > Vancomycin  4/30 >  Microbiology results: 4/30 bcx: 3/21 surgical woundcx: Moderate Bacteroides fragilis   Thank you for allowing pharmacy to be a part of this patient's care.  Fonda Hymen 02/01/2024 2:25 AM

## 2024-02-01 NOTE — Progress Notes (Addendum)
 RN informed by phleb of multiple failed attempts to draw labs. IV consult placed to see if IV team can assist. Per IV team Odie Benne RN), they aren't equipped with lab draw supplies and a midline would not be a feasible option due to "inconsistency with blood return. The patient also has two PIVs. He is also on daptomycin which should not infuse through a midline either". Mae Schlossman MD aware of situation and asks RN to notify phleb to send another tech to attempt lab draw. Charge RN aware  RN spoke with Maurie Southern from phleb who said there is currently a shift change but she will notify tech assigned to to come back and attempt lab re-draw.

## 2024-02-01 NOTE — Progress Notes (Signed)
 PROGRESS NOTE  [name] EAV:[id number] DOB: Aug 24, 1966   PCP: [name], MD  Patient is from: Home.  Lives with sister.  Wheelchair-bound at baseline.  DOA: 01/31/2024 LOS: 0  Chief complaints Chief Complaint  Patient presents with   Fatigue   Weakness   Wound Check     Brief Narrative / Interim history: 58 year old M with PMH of spina bifid/neurogenic bladder with urostomy and colostomy, chronic sacral decubitus, chronic osteomyelitis of ischial tuberosity, inferior pubic ramus, HTN, anemia and recent hospitalization from 3/21-3/28 for septic shock in the setting of necrotizing soft tissue infection and sacral decubitus for which she was treated with vasopressor, broad-spectrum antibiotics and discharged on p.o. Augmentin  for 14 weeks per recommendation by ID.    Patient presents with generalized weakness.  Workup in ED concerning for acute on chronic osteomyelitis, acute on chronic anemia, AKI and hypokalemia.  He had mild leukocytosis to 14.4.  Hgb dropped from 8.3 (baseline) to 5.9 and 5.5 on repeat.  Cr 1.98 (was 1.0 on 3/27).  K2.2.  CT abdomen and pelvis concerning bilateral deep gluteal ulcers with acute on chronic pelvic osteomyelitis and progressive left hip septic joint.  Blood cultures obtained.  Started on broad-spectrum antibiotics and potassium supplementation.  2 units of blood ordered.  Orthopedic surgery consulted  The next day, evaluated by orthopedic surgery who did not feel there is need for surgical intervention.  ID consulted.  Receiving blood transfusion and potassium supplementation.  Subjective: Seen and examined earlier this morning.  No major events overnight of this morning.  Feels better but has no recollection into why he was brought to the hospital.  Transfused 1 unit this morning.  Waiting on second unit.  AKI and hypokalemia improved some.  Objective: Vitals:   02/01/24 0900 02/01/24 0919 02/01/24 0950 02/01/24 1121  BP: (!)  86/50 (!) 86/50 (!) 85/57 (!) (P) 82/60  Pulse: 68 68 65 (P) 64  Resp: 15 15 19    Temp:  98.2 F (36.8 C) 98.5 F (36.9 C) (P) 98.3 F (36.8 C)  TempSrc:  Oral  (P) Axillary  SpO2: 100% 100% 100% (P) 100%  Weight:      Height:        Examination:  GENERAL: No apparent distress.  Nontoxic. HEENT: MMM.  Vision and hearing grossly intact.  NECK: Supple.  No apparent JVD.  RESP:  No IWOB.  Fair aeration bilaterally. CVS:  RRR. Heart sounds normal.  ABD/GI/GU: BS+. Abd soft.  RLQ urostomy.  LLQ colostomy. MSK/EXT: BLE deformity and atrophy. SKIN: Unstageable sacral decubitus.  See picture under media. NEURO: Awake and alert.  Oriented to self, place, month and year.  No apparent focal neuro deficit. PSYCH: Calm. Normal affect.   Consultants:  Orthopedic surgery Infectious disease  Procedures: None  Microbiology summarized: Blood cultures NGTD  Assessment and plan: Sepsis secondary to acute on chronic osteomyelitis of the ischial tuberosity/inferior pubic ramus Left hip septic joint Spina bifida with sacral decubitus ulcer stage IV -Recently hospitalized for septic shock and discharged on Augmentin  for 14 weeks. -CT concerning bilateral deep gluteal ulcers with acute on chronic pelvic osteomyelitis and progressive left hip septic joint.   -CRP 25.  ESR 140. -Leukocytosis resolved.  Blood cultures NGTD. -Per Ortho, no good surgical remedy -Continue vancomycin  and Zosyn .  ID consulted for guidance on antibiotics. -WOCN for wound care and ostomy care.  Acute on chronic anemia: Hgb dropped from 8.3-5.5.  Likely dilutional from IV fluid resuscitation.  No overt  bleeding.  2 units of blood ordered.  Received 1 unit Recent Labs    12/25/23 1110 12/25/23 1750 12/25/23 2326 12/26/23 0239 12/27/23 1107 12/28/23 0122 12/28/23 0505 02/01/24 0010 02/01/24 0405 02/01/24 0439  HGB 8.5* 8.6* 8.4* 8.5* 8.7* 8.6* 8.4* 8.3* 5.9* 5.5*  -Continue with second unit. -Check anemia  panel - Monitor H&H  AKI: B/l Cr ~1.0.  Likely hypovolemic.  Not on nephrotoxic meds.  Fair amount of urine in urostomy bag. Recent Labs    12/23/23 1833 12/23/23 1839 12/24/23 0243 12/25/23 0330 12/26/23 0239 12/27/23 1107 12/28/23 0103 12/29/23 0400 02/01/24 0010 02/01/24 0405  BUN 27* 28* 18 15 16 13 14 11  32* 27*  CREATININE 1.71* 1.80* 1.20 1.08 1.59* 1.38* 1.21 1.07 1.98* 1.78*  - Continue IV fluids-decrease rate - Avoid nephrotoxins.  May need to change his antibiotics - Increase midodrine  for BP support - Closely monitor urine output   Hypokalemia: K2.7.  Mg 2.0. -Ordered additional p.o. KCl 40 mill equivalent every 3 hours for a total of 120 -Recheck renal function later in the afternoon   Chronic hypotension -Increase midodrine  from 10 to 15 mg 3 times daily -Continue IV fluid.  Reduced rate. -Discontinue Flomax. -Blood transfusion as above -Check cortisol and TSH-will add to previous collection.   Prolonged QTc: QTc 599.  In the setting of hypokalemia. -Avoid QT prolonging drugs -Optimize electrolytes -Recheck EKG once electrolytes corrected -Continue telemetry monitoring   Hyponatremia: In the setting of renal failure, hypotension.  Improving -Continue monitoring   History of spina bifida with a stage IV ulcer, neurogenic bladder, urostomy and colostomy -Continue wound and ostomy care -Discontinue Flomax.  Does not help with neurogenic bladder   Non-anion gap metabolic acidosis: Likely due to renal failure.  Improved -Manage renal failure as above -Continue sodium bicarbonate   Body mass index is 24.45 kg/m.         Unstageable pressure skin injury of bilateral buttocks: Present on arrival Pressure Injury 12/24/23 Buttocks Right (Active)  12/24/23 0100  Location: Buttocks  Location Orientation: Right  Staging:   Wound Description (Comments):   Present on Admission: Yes     Pressure Injury 12/24/23 Buttocks Left (Active)  12/24/23 0100   Location: Buttocks  Location Orientation: Left  Staging:   Wound Description (Comments):   Present on Admission: Yes   DVT prophylaxis:  SCDs Start: 02/01/24 0204 Place TED hose Start: 02/01/24 0204  Code Status: Full code Family Communication: None at bedside Level of care: Telemetry Medical Status is: Inpatient Remains inpatient appropriate because: Acute on chronic osteomyelitis, anemia, AKI and hypokalemia   Final disposition: Likely home once medically stable   55 minutes with more than 50% spent in reviewing records, counseling patient/family and coordinating care.   Sch Meds:  Scheduled Meds:  sodium chloride    Intravenous Once   leptospermum manuka honey  1 Application Topical Daily   midodrine   10 mg Oral TID WC   pantoprazole   40 mg Oral Daily   potassium chloride  SA  40 mEq Oral Q3H   QUEtiapine   25 mg Oral QHS   rosuvastatin   10 mg Oral QHS   sodium bicarbonate  650 mg Oral TID   sodium chloride  flush  3 mL Intravenous Q12H   sucralfate   1 g Oral TID   tamsulosin  0.4 mg Oral Daily   vancomycin  variable dose per unstable renal function (pharmacist dosing)   Does not apply See admin instructions   Continuous Infusions:  sodium chloride   lactated ringers  150 mL/hr at 02/01/24 0125   piperacillin -tazobactam (ZOSYN )  IV 3.375 g (02/01/24 0725)   PRN Meds:.sodium chloride , acetaminophen  **OR** acetaminophen , prochlorperazine , sodium chloride  flush  Antimicrobials: Anti-infectives (From admission, onward)    Start     Dose/Rate Route Frequency Ordered Stop   02/01/24 0700  piperacillin -tazobactam (ZOSYN ) IVPB 3.375 g        3.375 g 12.5 mL/hr over 240 Minutes Intravenous Every 8 hours 02/01/24 0254     02/01/24 0348  vancomycin  variable dose per unstable renal function (pharmacist dosing)         Does not apply See admin instructions 02/01/24 0348     02/01/24 0015  vancomycin  (VANCOCIN ) IVPB 1000 mg/200 mL premix        1,000 mg 200 mL/hr over 60  Minutes Intravenous  Once 02/01/24 0010 02/01/24 0242   02/01/24 0015  piperacillin -tazobactam (ZOSYN ) IVPB 3.375 g        3.375 g 100 mL/hr over 30 Minutes Intravenous  Once 02/01/24 0010 02/01/24 0148        I have personally reviewed the following labs and images: CBC: Recent Labs  Lab 02/01/24 0010 02/01/24 0405 02/01/24 0439  WBC 14.4* 10.0  --   NEUTROABS 11.8*  --   --   HGB 8.3* 5.9* 5.5*  HCT 28.3* 20.3* 19.1*  MCV 77.3* 78.1*  --   PLT 566* 622*  --    BMP &GFR Recent Labs  Lab 02/01/24 0010 02/01/24 0403 02/01/24 0405  NA 131*  --  133*  K 2.2*  --  2.7*  CL 102  --  108  CO2 15*  --  17*  GLUCOSE 114*  --  118*  BUN 32*  --  27*  CREATININE 1.98*  --  1.78*  CALCIUM  7.9*  --  7.3*  MG  --  2.0  --    Estimated Creatinine Clearance: 26.3 mL/min (A) (by C-G formula based on SCr of 1.78 mg/dL (H)). Liver & Pancreas: Recent Labs  Lab 02/01/24 0010 02/01/24 0405  AST 27 22  ALT 18 12  ALKPHOS 127* 84  BILITOT 0.6 0.9  PROT 6.6 5.4*  ALBUMIN  <1.5* 1.7*   No results for input(s): "LIPASE", "AMYLASE" in the last 168 hours. No results for input(s): "AMMONIA" in the last 168 hours. Diabetic: No results for input(s): "HGBA1C" in the last 72 hours. No results for input(s): "GLUCAP" in the last 168 hours. Cardiac Enzymes: No results for input(s): "CKTOTAL", "CKMB", "CKMBINDEX", "TROPONINI" in the last 168 hours. No results for input(s): "PROBNP" in the last 8760 hours. Coagulation Profile: No results for input(s): "INR", "PROTIME" in the last 168 hours. Thyroid  Function Tests: No results for input(s): "TSH", "T4TOTAL", "FREET4", "T3FREE", "THYROIDAB" in the last 72 hours. Lipid Profile: No results for input(s): "CHOL", "HDL", "LDLCALC", "TRIG", "CHOLHDL", "LDLDIRECT" in the last 72 hours. Anemia Panel: No results for input(s): "VITAMINB12", "FOLATE", "FERRITIN", "TIBC", "IRON", "RETICCTPCT" in the last 72 hours. Urine analysis:    Component Value  Date/Time   COLORURINE AMBER (A) 01/31/2024 0000   APPEARANCEUR CLOUDY (A) 01/31/2024 0000   APPEARANCEUR Cloudy (A) 03/21/2019 1130   LABSPEC 1.008 01/31/2024 0000   PHURINE 8.0 01/31/2024 0000   GLUCOSEU NEGATIVE 01/31/2024 0000   HGBUR SMALL (A) 01/31/2024 0000   BILIRUBINUR NEGATIVE 01/31/2024 0000   BILIRUBINUR Negative 03/21/2019 1130   KETONESUR NEGATIVE 01/31/2024 0000   PROTEINUR 100 (A) 01/31/2024 0000   NITRITE NEGATIVE 01/31/2024 0000   LEUKOCYTESUR LARGE (A)  01/31/2024 0000   Sepsis Labs: Invalid input(s): "PROCALCITONIN", "LACTICIDVEN"  Microbiology: Recent Results (from the past 240 hours)  Blood culture (routine x 2)     Status: None (Preliminary result)   Collection Time: 02/01/24 12:04 AM   Specimen: BLOOD  Result Value Ref Range Status   Specimen Description BLOOD SITE NOT SPECIFIED  Final   Special Requests   Final    BOTTLES DRAWN AEROBIC AND ANAEROBIC Blood Culture results may not be optimal due to an inadequate volume of blood received in culture bottles   Culture   Final    NO GROWTH < 12 HOURS Performed at [facility], [address], [place], [place] [address]    Report Status PENDING  Incomplete  Culture, blood (Routine X 2) w Reflex to ID Panel     Status: None (Preliminary result)   Collection Time: 02/01/24 12:36 AM   Specimen: BLOOD  Result Value Ref Range Status   Specimen Description BLOOD SITE NOT SPECIFIED  Final   Special Requests   Final    BOTTLES DRAWN AEROBIC AND ANAEROBIC Blood Culture results may not be optimal due to an inadequate volume of blood received in culture bottles   Culture   Final    NO GROWTH < 12 HOURS Performed at [facility], [address], [place], [place] [address]    Report Status PENDING  Incomplete    Radiology Studies: CT ABDOMEN PELVIS WO CONTRAST Result Date: 01/31/2024 EXAM: CT ABDOMEN AND PELVIS WITHOUT CONTRAST 01/31/2024 11:19:26 PM TECHNIQUE: CT of the abdomen and pelvis was performed  without the administration of intravenous contrast. Multiplanar reformatted images are provided for review. Automated exposure control, iterative reconstruction, and/or weight based adjustment of the mA/kV was utilized to reduce the radiation dose to as low as reasonably achievable. COMPARISON: CT pelvis dated 12/23/2023 and CT abdomen/pelvis dated 03/07/2021. CLINICAL HISTORY: Extensive sacral and buttock wounds, concern for possible deep infection. Chief complaints; Fatigue; Weakness; Wound Check. FINDINGS: LOWER CHEST: No acute abnormality. HEPATOBILIARY: The liver is unremarkable. Gallbladder is unremarkable. No biliary ductal dilatation. SPLEEN: No acute abnormality. PANCREAS: No acute abnormality. ADRENAL GLANDS: No acute abnormality. KIDNEYS, URETERS AND BLADDER: Fragmented staghorn right renal calculus measuring up to 18 mm in the right renal pelvis (image 27), chronic. Clustered left renal calculi measuring up to 13 mm (image 29), chronic. No evidence of hydronephrosis. No evidence of perinephric or periureteral stranding. Urinary bladder is unremarkable. GI AND BOWEL: Status post left hemicolectomy with left mid-abdominal colostomy. Stomach demonstrates no acute abnormality. There is no evidence of bowel obstruction. No evidence of appendicitis. PERITONEUM AND RETROPERITONEUM: No evidence of ascites. No free air. Aorta is normal in caliber. LYMPH NODES: No evidence of lymphadenopathy. REPRODUCTIVE ORGANS: No acute abnormality. BONES AND SOFT TISSUES: Deep left gluteal ulcer with acute on chronic osteomyelitis involving the left inferior pubic ramus and ischium (image 58) and associated septic joint with fluid/gas in the left hip and progressive destruction of the left femoral head (image 57). Deep right gluteal ulcer with acute on chronic osteomyelitis involving the right inferior pubic ramus with new extension to the right greater trochanter (image 62). Chronic spinal dysraphism. IMPRESSION: 1. Bilateral  deep gluteal ulcers with acute on chronic pelvic osteomyelitis and left hip septic joint, as described above, progressive. 2. Additional stable ancillary findings, as above. Electronically signed by: Zadie Herter MD 01/31/2024 11:40 PM EDT RP Workstation: [id number]      Ilian Wessell T. Sohail Capraro [facility]  If 7PM-7AM,  please contact night-coverage [email or web address] 02/01/2024, 11:45 AM

## 2024-02-01 NOTE — ED Notes (Signed)
 Pts colostomy and urine bag filled and leaking. RN and NT changed pt. New chucks and diaper applied at this time. Peri care performed.

## 2024-02-01 NOTE — Progress Notes (Signed)
 Acute on chronic anemia - Hemoglobin has been dropped 8.3 to 5.9.  There is no external evidence of bleeding.  Repeated H&H again it is 5.5. -Transfusing 2 units of blood. -Unable to obtain any consent from patient's sister over phone as she is not picking of the phone. -Per chart review patient previously has been transfused in March 2025 and 2020 without any blood transfusion related infection.   -Given it is a medical necessary to transfuse blood in the setting of low hemoglobin hypotension and sepsis transfusing 2 units of blood this morning.    Regis Hinton, MD [facility] 02/01/2024, 5:05 AM

## 2024-02-01 NOTE — Progress Notes (Signed)
 Pharmacy Antibiotic Note  [name] is a 58 y.o. male admitted on 01/31/2024 with sacral wound and concern for osteomyelitis. PMH significant for spina bifida, urostomy, colostomy, neurogenic bladder and HTN. Previously hospitalized for septic shock 2/2 necrotizing infection from decubitus ulcer where patient got debridement 3/22 with plans for antibiotics for 14 days (ended on 4/5). Patient has a history of wound/blood cultures growing cory striatum, MRSA (Vanc MIC 1), proteus mirabilis, providencia stuartii, PsA and b fragilis. He most frequently has had Providencia stuartii and proteus mirabilis growth. He has also has had a positive blood from Enterococcus 2/2 UTI, no s/p urostomy.   Patient currently has an AKI with BL Scr ~1.2, currently 1.78 (improved from 1.98 on admission). Patient also has documented UOP of 800 mL in past 24h- non-oliguric. Patient's renal function appears falsely low due to very low body weight of 46 kg. Normalized CrCl ~47 mL/min.- will dose daptomycin on a Q24H interval in this context, accounting for improvement in renal function since admission. Can consider extending dosing interval to Q48H if renal function worsens.   Plan: Stop Vancomycin   Start Daptomycin 350 mg IV Q24H (~8 mg /kg TBW)  Cont Zosyn  3.375 g IV Q8H  CK weekly- first 5/1, F/U ID recs   Height: 4\' 6"  (137.2 cm) Weight: 46 kg (101 lb 6.6 oz) IBW/kg (Calculated) : 36.2  Temp (24hrs), Avg:98.2 F (36.8 C), Min:97.3 F (36.3 C), Max:98.7 F (37.1 C)  Recent Labs  Lab 02/01/24 0010 02/01/24 0014 02/01/24 0405  WBC 14.4*  --  10.0  CREATININE 1.98*  --  1.78*  LATICACIDVEN  --  1.5  --     Estimated Creatinine Clearance: 26.3 mL/min (A) (by C-G formula based on SCr of 1.78 mg/dL (H)).    Allergies  Allergen Reactions   Morphine And Codeine Nausea And Vomiting   Metrizamide Hives   Sulfa Antibiotics Other (See Comments)    Reaction: unknown   Ivp Dye [Iodinated Contrast  Media] Hives   Latex Rash    Antimicrobials this admission: Zosyn  4/30 > Vancomycin  4/30 x1  Daptomycin 5/1>>   Microbiology results: 4/30 bcx: 3/21 surgical woundcx: Moderate Bacteroides fragilis   Thank you for allowing pharmacy to be a part of this patient's care.  Chrystie Crass, PharmD Clinical Pharmacist  02/01/2024 5:27 PM

## 2024-02-01 NOTE — Consult Note (Signed)
 WOC Nurse Consult Note: Reason for Consult:Stage 4 chronic nonhealing pressure injuries to bilateral ischial tuberosities.  Connected and form one large wound across scrotum.. Bedside RN obtained measurements and will update flowsheet.   Surgery has consulted and no intervention needed at this time.   Long discussion with patient regarding his care.  His need to offload pressure to ischium by transferring out of wheelchair and back to bed.  HIs sister/caregiver is gone for 6-8 hours most days and he must remain in chair for meal preparation.  He agrees that if he had assistance with meals and perhaps ADL's such as bathing and ostomy care, he could return to bed more often.  WE discuss possible need for SNF level of care to promote wound healing. If not, would benefit from MEals on Wheels and an in home aide.  RLQ urostomy LLQ colostomy Wound type:stage 4 pressure injury Pressure Injury POA: Yes chronic nonhealing.  Measurement:see flow sheets. Obtained this AM Wound bed:80% pale pink nongranulating with 20% thin devitalized tissue, fibrinous slough Drainage (amount, consistency, odor) moderate serosanguinous  musty odor  Periwound: intact Dressing procedure/placement/frequency: Cleanse wounds to bilateral ischial tuberosity with VASHE (LAWSON # [id number])LIne wound bed with Aquacel (LAWSON # [id number])  FIll remaining dead space with fluffed gauze   TOp with ABD Pads and tape. CHange daily  WOC Nurse ostomy consult note Stoma type/location: LLQ colostomy Stomal assessment/size: flush 1 1/4"  Peristomal assessment: intact  Treatment options for stomal/peristomal skin: COnvex pouch with barrier ring  Output soft brown stool Ostomy pouching: 2pc. 2 1/4" (Numbers in ostomy order)  Pouch is changed and supplies ordered.  Education provided: None Enrolled patient in [employer] DC program: Yes previously WOC Nurse ostomy consult note Stoma type/location: RLQ urostomy Stomal assessment/size: 1"  flush Peristomal assessment: intact Treatment options for stomal/peristomal skin: barrier ring and 1 piece convex pouch  Output clear yellow urine Ostomy pouching: 1pc.convex with barrier ring  WIll need to be connected to bedside drainage  LAWSON #s provided to secretary Will not follow at this time.  Please re-consult if needed.  Branda Cain MSN, RN, FNP-BC CWON Wound, Ostomy, Continence Nurse [facility]patient [facility] [phone] Pager [phone]

## 2024-02-01 NOTE — Consult Note (Signed)
 Reason for Consult:Left hip osteo Referring Physician: Taye Gonfa Time called: [id number] Time at bedside: 0901   [name] is an 58 y.o. male.  HPI: [name] came to the ED with about 3d of fatigue and a worsening decub. He was found to have sepsis and was admitted. Workup showed pevis/hip osteo and orthopedic surgery was consulted. He denies hip pain or pelvic pain in general.  Past Medical History:  Diagnosis Date   Abdominal distention 10/11/2016   Acute lower UTI 12/22/2019   Acute sepsis (HCC) 04/21/2016   AKI (acute kidney injury) (HCC) 06/27/2020   Decubitus ulcer    Distal intestinal obstruction syndrome (HCC) 10/11/2016   Fecal impaction in rectum (HCC) 10/10/2016   Gastric outlet obstruction 06/03/2016   Gastritis    GERD (gastroesophageal reflux disease) 12/22/2019   HTN (hypertension) 06/27/2020   Hydronephrosis    Hyperlipidemia    Hypokalemia 10/11/2016   Impaction of the bowels (HCC)    Kidney stone    Left ureteral stone 06/27/2020   Leukocytosis 10/11/2016   Loose stools 10/11/2016   Nausea & vomiting 06/03/2016   Renal disorder    SBO (small bowel obstruction) (HCC) 11/02/2017   Sepsis due to gram-negative UTI (HCC) 03/08/2021   Septic shock (HCC) 03/08/2021   SIRS (systemic inflammatory response syndrome) (HCC) 06/03/2016   Spina bifida (HCC)    Ulcer of esophagus with bleeding     Past Surgical History:  Procedure Laterality Date   ABDOMINAL SURGERY     APPENDECTOMY     BACK SURGERY     COLON SURGERY     CREATION / REVISION OF ILEOSTOMY / JEJUNOSTOMY  05/2015   CYSTOSCOPY WITH RETROGRADE PYELOGRAM, URETEROSCOPY AND STENT PLACEMENT  03/2014   ESOPHAGOGASTRODUODENOSCOPY (EGD) WITH PROPOFOL  N/A 06/03/2016   Procedure: ESOPHAGOGASTRODUODENOSCOPY (EGD) WITH PROPOFOL ;  Surgeon: Marnee Sink, MD;  Location: [facility] ENDOSCOPY;  Service: Endoscopy;  Laterality: N/A;   ESOPHAGOGASTRODUODENOSCOPY (EGD) WITH PROPOFOL  N/A 04/27/2019   Procedure: ESOPHAGOGASTRODUODENOSCOPY (EGD)  WITH PROPOFOL ;  Surgeon: Luke Salaam, MD;  Location: [facility] ENDOSCOPY;  Service: Gastroenterology;  Laterality: N/A;   ilieostomy     IRRIGATION AND DEBRIDEMENT OF NECROTIZING SOFT TISSUE INFECTION Bilateral 12/23/2023   Procedure: IRRIGATION AND DEBRIDEMENT OF NECROTIZING SOFT TISSUE INFECTION;  Surgeon: Edmon Gosling, MD;  Location: [facility] OR;  Service: General;  Laterality: Bilateral;  prone   PERCUTANEOUS NEPHROSTOLITHOTOMY     PERCUTANEOUS NEPHROSTOMY     VENTRICULOPERITONEAL SHUNT     vp shunt removal      Family History  Problem Relation Age of Onset   Diabetes Mellitus II Father    Bladder Cancer Neg Hx    Kidney cancer Neg Hx    Prostate cancer Neg Hx     Social History:  reports that he has never smoked. He has never used smokeless tobacco. He reports that he does not drink alcohol and does not use drugs.  Allergies:  Allergies  Allergen Reactions   Morphine And Codeine Nausea And Vomiting   Metrizamide Hives   Sulfa Antibiotics Other (See Comments)    Reaction: unknown   Ivp Dye [Iodinated Contrast Media] Hives   Latex Rash    Medications: I have reviewed the patient's current medications.  Results for orders placed or performed during the hospital encounter of 01/31/24 (from the past 48 hours)  Urinalysis, Routine w reflex microscopic -Urine, Clean Catch     Status: Abnormal   Collection Time: 01/31/24 12:00 AM  Result Value Ref Range   Color,  Urine AMBER (A) YELLOW    Comment: BIOCHEMICALS MAY BE AFFECTED BY COLOR   APPearance CLOUDY (A) CLEAR   Specific Gravity, Urine 1.008 1.005 - 1.030   pH 8.0 5.0 - 8.0   Glucose, UA NEGATIVE NEGATIVE mg/dL   Hgb urine dipstick SMALL (A) NEGATIVE   Bilirubin Urine NEGATIVE NEGATIVE   Ketones, ur NEGATIVE NEGATIVE mg/dL   Protein, ur 284 (A) NEGATIVE mg/dL   Nitrite NEGATIVE NEGATIVE   Leukocytes,Ua LARGE (A) NEGATIVE   RBC / HPF 6-10 0 - 5 RBC/hpf   WBC, UA 21-50 0 - 5 WBC/hpf   Bacteria, UA MANY (A) NONE SEEN    Squamous Epithelial / HPF 0-5 0 - 5 /HPF   WBC Clumps PRESENT    Mucus PRESENT     Comment: Performed at [facility], [address], [place], [place] [address]  Blood culture (routine x 2)     Status: None (Preliminary result)   Collection Time: 02/01/24 12:04 AM   Specimen: BLOOD  Result Value Ref Range   Specimen Description BLOOD SITE NOT SPECIFIED    Special Requests      BOTTLES DRAWN AEROBIC AND ANAEROBIC Blood Culture results may not be optimal due to an inadequate volume of blood received in culture bottles   Culture      NO GROWTH < 12 HOURS Performed at [facility], [address], [place], [place] [address]    Report Status PENDING   Comprehensive metabolic panel     Status: Abnormal   Collection Time: 02/01/24 12:10 AM  Result Value Ref Range   Sodium 131 (L) 135 - 145 mmol/L   Potassium 2.2 (LL) 3.5 - 5.1 mmol/L    Comment: CRITICAL RESULT CALLED TO, READ BACK BY AND VERIFIED WITH R.ESCUTIN, RN @0057  04.30.25 AALTOM   Chloride 102 98 - 111 mmol/L   CO2 15 (L) 22 - 32 mmol/L   Glucose, Bld 114 (H) 70 - 99 mg/dL    Comment: Glucose reference range applies only to samples taken after fasting for at least 8 hours.   BUN 32 (H) 6 - 20 mg/dL   Creatinine, Ser 2.53 (H) 0.61 - 1.24 mg/dL   Calcium  7.9 (L) 8.9 - 10.3 mg/dL   Total Protein 6.6 6.5 - 8.1 g/dL   Albumin  <1.5 (L) 3.5 - 5.0 g/dL   AST 27 15 - 41 U/L   ALT 18 0 - 44 U/L   Alkaline Phosphatase 127 (H) 38 - 126 U/L   Total Bilirubin 0.6 0.0 - 1.2 mg/dL   GFR, Estimated 39 (L) >60 mL/min    Comment: (NOTE) Calculated using the CKD-EPI Creatinine Equation (2021)    Anion gap 14 5 - 15    Comment: Performed at [facility], [address], [place], [place] [address]  CBC with Differential     Status: Abnormal   Collection Time: 02/01/24 12:10 AM  Result Value Ref Range   WBC 14.4 (H) 4.0 - 10.5 K/uL   RBC 3.66 (L) 4.22 - 5.81 MIL/uL   Hemoglobin 8.3 (L) 13.0 - 17.0 g/dL    Comment:  Reticulocyte Hemoglobin testing may be clinically indicated, consider ordering this additional test [id number]    HCT 28.3 (L) 39.0 - 52.0 %   MCV 77.3 (L) 80.0 - 100.0 fL   MCH 22.7 (L) 26.0 - 34.0 pg   MCHC 29.3 (L) 30.0 - 36.0 g/dL   RDW 63.8 (H) 75.6 - 43.3 %  Platelets 566 (H) 150 - 400 K/uL   nRBC 0.0 0.0 - 0.2 %   Neutrophils Relative % 82 %   Neutro Abs 11.8 (H) 1.7 - 7.7 K/uL   Lymphocytes Relative 11 %   Lymphs Abs 1.5 0.7 - 4.0 K/uL   Monocytes Relative 6 %   Monocytes Absolute 0.8 0.1 - 1.0 K/uL   Eosinophils Relative 0 %   Eosinophils Absolute 0.0 0.0 - 0.5 K/uL   Basophils Relative 0 %   Basophils Absolute 0.0 0.0 - 0.1 K/uL   Immature Granulocytes 1 %   Abs Immature Granulocytes 0.16 (H) 0.00 - 0.07 K/uL    Comment: Performed at [facility], [address] [place], [place] [address]  Sedimentation rate     Status: Abnormal   Collection Time: 02/01/24 12:10 AM  Result Value Ref Range   Sed Rate 139 (H) 0 - 16 mm/hr    Comment: Performed at [facility], [address] [place], [place] [address]  C-reactive protein     Status: Abnormal   Collection Time: 02/01/24 12:10 AM  Result Value Ref Range   CRP 24.5 (H) <1.0 mg/dL    Comment: Performed at [facility], [address], [place], [place] [address]  I-Stat CG4 Lactic Acid     Status: None   Collection Time: 02/01/24 12:14 AM  Result Value Ref Range   Lactic Acid, Venous 1.5 0.5 - 1.9 mmol/L  Culture, blood (Routine X 2) w Reflex to ID Panel     Status: None (Preliminary result)   Collection Time: 02/01/24 12:36 AM   Specimen: BLOOD  Result Value Ref Range   Specimen Description BLOOD SITE NOT SPECIFIED    Special Requests      BOTTLES DRAWN AEROBIC AND ANAEROBIC Blood Culture results may not be optimal due to an inadequate volume of blood received in culture bottles   Culture      NO GROWTH < 12 HOURS Performed at [facility], [address] [place], [place] [address]     Report Status PENDING   Magnesium      Status: None   Collection Time: 02/01/24  4:03 AM  Result Value Ref Range   Magnesium  2.0 1.7 - 2.4 mg/dL    Comment: Performed at [facility], [address], [place], [place] [address]  CBC     Status: Abnormal   Collection Time: 02/01/24  4:05 AM  Result Value Ref Range   WBC 10.0 4.0 - 10.5 K/uL   RBC 2.60 (L) 4.22 - 5.81 MIL/uL   Hemoglobin 5.9 (LL) 13.0 - 17.0 g/dL    Comment: REPEATED TO VERIFY THIS CRITICAL RESULT HAS VERIFIED AND BEEN CALLED TO V. PHILER RN BY SAMAYA BYRD ON 04 30 2025 AT 0429, AND HAS BEEN READ BACK.     HCT 20.3 (L) 39.0 - 52.0 %   MCV 78.1 (L) 80.0 - 100.0 fL   MCH 22.7 (L) 26.0 - 34.0 pg   MCHC 29.1 (L) 30.0 - 36.0 g/dL   RDW 13.0 (H) 86.5 - 78.4 %   Platelets 622 (H) 150 - 400 K/uL   nRBC 0.0 0.0 - 0.2 %    Comment: Performed at [facility], [address], [place], [place] [address]  Comprehensive metabolic panel     Status: Abnormal   Collection Time: 02/01/24  4:05 AM  Result Value Ref Range   Sodium 133 (L) 135 - 145  mmol/L   Potassium 2.7 (LL) 3.5 - 5.1 mmol/L    Comment: CRITICAL RESULT CALLED TO, READ BACK BY AND VERIFIED WITH Z.PHIFER, RN @0450  04.30.25 AALTOM   Chloride 108 98 - 111 mmol/L   CO2 17 (L) 22 - 32 mmol/L   Glucose, Bld 118 (H) 70 - 99 mg/dL    Comment: Glucose reference range applies only to samples taken after fasting for at least 8 hours.   BUN 27 (H) 6 - 20 mg/dL   Creatinine, Ser 2.13 (H) 0.61 - 1.24 mg/dL   Calcium  7.3 (L) 8.9 - 10.3 mg/dL   Total Protein 5.4 (L) 6.5 - 8.1 g/dL   Albumin  1.7 (L) 3.5 - 5.0 g/dL   AST 22 15 - 41 U/L   ALT 12 0 - 44 U/L   Alkaline Phosphatase 84 38 - 126 U/L   Total Bilirubin 0.9 0.0 - 1.2 mg/dL   GFR, Estimated 44 (L) >60 mL/min    Comment: (NOTE) Calculated using the CKD-EPI Creatinine Equation (2021)    Anion gap 8 5 - 15    Comment: Performed at [facility], [address] [place], [place] [address]  Hemoglobin and  hematocrit, blood     Status: Abnormal   Collection Time: 02/01/24  4:39 AM  Result Value Ref Range   Hemoglobin 5.5 (LL) 13.0 - 17.0 g/dL    Comment: REPEATED TO VERIFY THIS CRITICAL RESULT HAS VERIFIED AND BEEN CALLED TO Z.PHIEFER RN BY SAMAYA BYRD ON 04 30 2025 AT 0456, AND HAS BEEN READ BACK.     HCT 19.1 (L) 39.0 - 52.0 %    Comment: Performed at [facility], [address] [place], [place] [address]  Prepare RBC (crossmatch)     Status: None   Collection Time: 02/01/24  4:58 AM  Result Value Ref Range   Order Confirmation      ORDER PROCESSED BY BLOOD BANK Performed at [facility], [address], [place], [place] [address]   Type and screen [facility]     Status: None (Preliminary result)   Collection Time: 02/01/24  5:06 AM  Result Value Ref Range   ABO/RH(D) A POS    Antibody Screen NEG    Sample Expiration 02/04/2024,2359    Unit Number [id number]    Blood Component Type RED CELLS,LR    Unit division 00    Status of Unit ALLOCATED    Transfusion Status OK TO TRANSFUSE    Crossmatch Result Compatible    Unit Number [id number]    Blood Component Type RED CELLS,LR    Unit division 00    Status of Unit ISSUED    Transfusion Status OK TO TRANSFUSE    Crossmatch Result      Compatible Performed at [facility], [address] [place], [place] [address]    Unit Number [id number]    Blood Component Type RBC LR PHER1    Unit division 00    Status of Unit ALLOCATED    Transfusion Status OK TO TRANSFUSE    Crossmatch Result Compatible     CT ABDOMEN PELVIS WO CONTRAST Result Date: 01/31/2024 EXAM: CT ABDOMEN AND PELVIS WITHOUT CONTRAST 01/31/2024 11:19:26 PM TECHNIQUE: CT of the abdomen and pelvis was performed without the administration of intravenous contrast. Multiplanar reformatted images are provided for review. Automated exposure control, iterative reconstruction, and/or weight based adjustment of the mA/kV was  utilized to reduce the radiation dose to  as low as reasonably achievable. COMPARISON: CT pelvis dated 12/23/2023 and CT abdomen/pelvis dated 03/07/2021. CLINICAL HISTORY: Extensive sacral and buttock wounds, concern for possible deep infection. Chief complaints; Fatigue; Weakness; Wound Check. FINDINGS: LOWER CHEST: No acute abnormality. HEPATOBILIARY: The liver is unremarkable. Gallbladder is unremarkable. No biliary ductal dilatation. SPLEEN: No acute abnormality. PANCREAS: No acute abnormality. ADRENAL GLANDS: No acute abnormality. KIDNEYS, URETERS AND BLADDER: Fragmented staghorn right renal calculus measuring up to 18 mm in the right renal pelvis (image 27), chronic. Clustered left renal calculi measuring up to 13 mm (image 29), chronic. No evidence of hydronephrosis. No evidence of perinephric or periureteral stranding. Urinary bladder is unremarkable. GI AND BOWEL: Status post left hemicolectomy with left mid-abdominal colostomy. Stomach demonstrates no acute abnormality. There is no evidence of bowel obstruction. No evidence of appendicitis. PERITONEUM AND RETROPERITONEUM: No evidence of ascites. No free air. Aorta is normal in caliber. LYMPH NODES: No evidence of lymphadenopathy. REPRODUCTIVE ORGANS: No acute abnormality. BONES AND SOFT TISSUES: Deep left gluteal ulcer with acute on chronic osteomyelitis involving the left inferior pubic ramus and ischium (image 58) and associated septic joint with fluid/gas in the left hip and progressive destruction of the left femoral head (image 57). Deep right gluteal ulcer with acute on chronic osteomyelitis involving the right inferior pubic ramus with new extension to the right greater trochanter (image 62). Chronic spinal dysraphism. IMPRESSION: 1. Bilateral deep gluteal ulcers with acute on chronic pelvic osteomyelitis and left hip septic joint, as described above, progressive. 2. Additional stable ancillary findings, as above. Electronically signed by: Zadie Herter MD 01/31/2024 11:40 PM EDT RP Workstation: [id number]    Review of Systems  Constitutional:  Positive for fatigue. Negative for chills, diaphoresis and fever.  HENT:  Negative for ear discharge, ear pain, hearing loss and tinnitus.   Eyes:  Negative for photophobia and pain.  Respiratory:  Negative for cough and shortness of breath.   Cardiovascular:  Negative for chest pain.  Gastrointestinal:  Negative for abdominal pain, nausea and vomiting.  Genitourinary:  Negative for dysuria, flank pain, frequency and urgency.  Musculoskeletal:  Negative for arthralgias, back pain, myalgias and neck pain.  Neurological:  Negative for dizziness and headaches.  Hematological:  Does not bruise/bleed easily.  Psychiatric/Behavioral:  The patient is not nervous/anxious.    Blood pressure (!) 91/50, pulse 69, temperature 97.7 F (36.5 C), temperature source Oral, resp. rate 19, height 4\' 6"  (1.372 m), weight 46 kg, SpO2 100%. Physical Exam Constitutional:      General: He is not in acute distress.    Appearance: He is well-developed. He is not diaphoretic.  HENT:     Head: Normocephalic and atraumatic.  Eyes:     General: No scleral icterus.       Right eye: No discharge.        Left eye: No discharge.     Conjunctiva/sclera: Conjunctivae normal.  Cardiovascular:     Rate and Rhythm: Normal rate and regular rhythm.  Pulmonary:     Effort: Pulmonary effort is normal. No respiratory distress.  Musculoskeletal:     Cervical back: Normal range of motion.     Comments: LLE Sacral/ischial decubs, no ecchymosis or rash  Nontender, no pain with hip motion  No knee or ankle effusion  Sens DPN, SPN, TN intact  Motor EHL, ext, flex, evers 0/5  No significant edema  Skin:    General: Skin is warm and dry.  Neurological:     Mental Status: He is  alert.  Psychiatric:        Mood and Affect: Mood normal.        Behavior: Behavior normal.     Assessment/Plan: Left hip/pelvis osteo --  Unfortunately no good surgical remedy exists for Da[place]. Recommend infection suppression with long-term abx. Consult GS/plastics for decubs if need be. Do not think hip aspiration would be helpful but will let ID decide that definitively.    Georganna Kin, PA-C Orthopedic Surgery [phone] 02/01/2024, 9:06 AM

## 2024-02-01 NOTE — Progress Notes (Signed)
 ED Pharmacy Antibiotic Sign Off An antibiotic consult was received from an ED provider for vancomycin  and zosyn  per pharmacy dosing for osteomyelitis. A chart review was completed to assess appropriateness.   The following one time order(s) were placed:  Vancomycin  1000mg  Zosyn  3.375g   Further antibiotic and/or antibiotic pharmacy consults should be ordered by the admitting provider if indicated.   Thank you for allowing pharmacy to be a part of this patient's care.   Fonda Hymen, [facility]  Clinical Pharmacist 02/01/24 12:08 AM

## 2024-02-01 NOTE — Consult Note (Addendum)
 [facility]  Total days of antibiotics 2/ vanco & piptazo Reason for Consult:acute on chronic sacral OM    Referring Physician: gonfa  Principal Problem:   Acute on chronic osteomyelitis of sacrum (HCC) Active Problems:   Chronic hypokalemia   Essential hypertension   Acute kidney injury superimposed on chronic kidney disease (HCC)   Acute anemia   Sepsis (HCC)   Metabolic acidosis   History of spina bifida   Colostomy in place [facility])   History of urostomy   Sacral decubitus ulcer, stage IV (HCC)   Microcytic anemia   CKD (chronic kidney disease) stage 2, GFR 60-89 ml/min   Chronic hypotension   Generalized anxiety disorder    HPI: [name] is a 58 y.o. male with history of paraplegia from spina bifida with neurogenic bladder s/p ileal conduit, has chronic bialteral hydronephrosis/nephrolithaisis s/p perc nephrostomy. S/p colostomy. Hx of chronic sacral decubitus ulcers with prior hx of sacral osteomyelitis. He has been followed intermittently by ID service. Last saw dr Francee Inch in August 2024 with recommendation to treat superficial skin infection and use dakin's topical nad medihoney to wound bed. He was admitted recently in late march for worsening wounds where he did undergo I x D on 3/22 and transitioned to 2 wk of augmentin  which would have finished mid April. He is now readmitted on 4/29 with fatigue, weakness, and possbly worsening wounds. Wbc of 10K, afebrile, the images of his wound are much more extensive than what they had been like last summer. Still some areas (305) fibrinous exudate. He has been seen by surgery who feel that he doesn't need further surgical debridement. Wound care has also seen the patient and assessed wound bed at 80% pink nongranulating and 20% devitalized tissue, fibrinous slough.   Patient spends most of his day in bed and really needs to offlaod pressure but his sister who he lives with works fulltime  job and not home 8hrs a day.   Past Medical History:  Diagnosis Date   Abdominal distention 10/11/2016   Acute lower UTI 12/22/2019   Acute sepsis (HCC) 04/21/2016   AKI (acute kidney injury) (HCC) 06/27/2020   Decubitus ulcer    Distal intestinal obstruction syndrome (HCC) 10/11/2016   Fecal impaction in rectum (HCC) 10/10/2016   Gastric outlet obstruction 06/03/2016   Gastritis    GERD (gastroesophageal reflux disease) 12/22/2019   HTN (hypertension) 06/27/2020   Hydronephrosis    Hyperlipidemia    Hypokalemia 10/11/2016   Impaction of the bowels (HCC)    Kidney stone    Left ureteral stone 06/27/2020   Leukocytosis 10/11/2016   Loose stools 10/11/2016   Nausea & vomiting 06/03/2016   Renal disorder    SBO (small bowel obstruction) (HCC) 11/02/2017   Sepsis due to gram-negative UTI (HCC) 03/08/2021   Septic shock (HCC) 03/08/2021   SIRS (systemic inflammatory response syndrome) (HCC) 06/03/2016   Spina bifida (HCC)    Ulcer of esophagus with bleeding     Allergies:  Allergies  Allergen Reactions   Morphine And Codeine Nausea And Vomiting   Metrizamide Hives   Sulfa Antibiotics Other (See Comments)    Reaction: unknown   Ivp Dye [Iodinated Contrast Media] Hives   Latex Rash    Current antibiotics:   MEDICATIONS:  sodium chloride    Intravenous Once   leptospermum manuka honey  1 Application Topical Daily   midodrine   15 mg Oral TID WC   pantoprazole   40 mg Oral  Daily   QUEtiapine   25 mg Oral QHS   rosuvastatin   10 mg Oral QHS   sodium chloride  flush  3 mL Intravenous Q12H   sucralfate   1 g Oral TID   vancomycin  variable dose per unstable renal function (pharmacist dosing)   Does not apply See admin instructions    Social History   Tobacco Use   Smoking status: Never   Smokeless tobacco: Never  Substance Use Topics   Alcohol use: No   Drug use: No    Family History  Problem Relation Age of Onset   Diabetes Mellitus II Father    Bladder Cancer Neg Hx    Kidney cancer Neg  Hx    Prostate cancer Neg Hx     Review of Systems - 12 point ros is otherwise negative except what is mentioned in hpi  OBJECTIVE: Temp:  [97.3 F (36.3 C)-98.7 F (37.1 C)] 98.7 F (37.1 C) (04/30 1440) Pulse Rate:  [64-111] 67 (04/30 1440) Resp:  [13-25] 13 (04/30 1145) BP: (77-122)/(47-76) 84/51 (04/30 1440) SpO2:  [98 %-100 %] 98 % (04/30 1440) Weight:  [46 kg] 46 kg (04/29 2146) Physical Exam  Constitutional: He is oriented to person, place, and time. He appears well-developed and well-nourished. No distress.  HENT:  Mouth/Throat: Oropharynx is clear and moist. No oropharyngeal exudate.  Cardiovascular: Normal rate, regular rhythm and normal heart sounds. Exam reveals no gallop and no friction rub.  No murmur heard.  Pulmonary/Chest: Effort normal and breath sounds normal. No respiratory distress. He has no wheezes.  Abdominal: Soft. Bowel sounds are normal. He exhibits no distension. There is no tenderness.  LLQ osteomy, RLQ urostomy He has no cervical adenopathy.  Neurological: He is alert and oriented to person, place, and time.  Skin: Skin is warm and dry. No rash noted. No erythema.  Psychiatric: He has a normal mood and affect. His behavior is normal.     Media Information   Document Information  Photos  [place]  02/01/2024 11:49  Attached To:  Hospital Encounter on 01/31/24  Source Information  Elvina Hammers, RN  [facility]  Document History    LABS: Results for orders placed or performed during the hospital encounter of 01/31/24 (from the past 48 hours)  Urinalysis, Routine w reflex microscopic -Urine, Clean Catch     Status: Abnormal   Collection Time: 01/31/24 12:00 AM  Result Value Ref Range   Color, Urine AMBER (A) YELLOW    Comment: BIOCHEMICALS MAY BE AFFECTED BY COLOR   APPearance CLOUDY (A) CLEAR   Specific Gravity, Urine 1.008 1.005 - 1.030   pH 8.0 5.0 - 8.0   Glucose, UA NEGATIVE NEGATIVE mg/dL   Hgb urine dipstick SMALL (A) NEGATIVE    Bilirubin Urine NEGATIVE NEGATIVE   Ketones, ur NEGATIVE NEGATIVE mg/dL   Protein, ur 161 (A) NEGATIVE mg/dL   Nitrite NEGATIVE NEGATIVE   Leukocytes,Ua LARGE (A) NEGATIVE   RBC / HPF 6-10 0 - 5 RBC/hpf   WBC, UA 21-50 0 - 5 WBC/hpf   Bacteria, UA MANY (A) NONE SEEN   Squamous Epithelial / HPF 0-5 0 - 5 /HPF   WBC Clumps PRESENT    Mucus PRESENT     Comment: Performed at [facility], [address], [place], [place] [address]  Blood culture (routine x 2)     Status: None (Preliminary result)   Collection Time: 02/01/24 12:04 AM   Specimen: BLOOD  Result Value Ref Range   Specimen  Description BLOOD SITE NOT SPECIFIED    Special Requests      BOTTLES DRAWN AEROBIC AND ANAEROBIC Blood Culture results may not be optimal due to an inadequate volume of blood received in culture bottles   Culture      NO GROWTH < 12 HOURS Performed at [facility], [address], [place], [place] [address]    Report Status PENDING   Comprehensive metabolic panel     Status: Abnormal   Collection Time: 02/01/24 12:10 AM  Result Value Ref Range   Sodium 131 (L) 135 - 145 mmol/L   Potassium 2.2 (LL) 3.5 - 5.1 mmol/L    Comment: CRITICAL RESULT CALLED TO, READ BACK BY AND VERIFIED WITH R.ESCUTIN, RN @0057  04.30.25 AALTOM   Chloride 102 98 - 111 mmol/L   CO2 15 (L) 22 - 32 mmol/L   Glucose, Bld 114 (H) 70 - 99 mg/dL    Comment: Glucose reference range applies only to samples taken after fasting for at least 8 hours.   BUN 32 (H) 6 - 20 mg/dL   Creatinine, Ser 1.02 (H) 0.61 - 1.24 mg/dL   Calcium  7.9 (L) 8.9 - 10.3 mg/dL   Total Protein 6.6 6.5 - 8.1 g/dL   Albumin  <1.5 (L) 3.5 - 5.0 g/dL   AST 27 15 - 41 U/L   ALT 18 0 - 44 U/L   Alkaline Phosphatase 127 (H) 38 - 126 U/L   Total Bilirubin 0.6 0.0 - 1.2 mg/dL   GFR, Estimated 39 (L) >60 mL/min    Comment: (NOTE) Calculated using the CKD-EPI Creatinine Equation (2021)    Anion gap 14 5 - 15    Comment: Performed at [facility], [address], [place], [place] [address]  CBC with Differential     Status: Abnormal   Collection Time: 02/01/24 12:10 AM  Result Value Ref Range   WBC 14.4 (H) 4.0 - 10.5 K/uL   RBC 3.66 (L) 4.22 - 5.81 MIL/uL   Hemoglobin 8.3 (L) 13.0 - 17.0 g/dL    Comment: Reticulocyte Hemoglobin testing may be clinically indicated, consider ordering this additional test [id number]    HCT 28.3 (L) 39.0 - 52.0 %   MCV 77.3 (L) 80.0 - 100.0 fL   MCH 22.7 (L) 26.0 - 34.0 pg   MCHC 29.3 (L) 30.0 - 36.0 g/dL   RDW 25.9 (H) 56.3 - 87.5 %   Platelets 566 (H) 150 - 400 K/uL   nRBC 0.0 0.0 - 0.2 %   Neutrophils Relative % 82 %   Neutro Abs 11.8 (H) 1.7 - 7.7 K/uL   Lymphocytes Relative 11 %   Lymphs Abs 1.5 0.7 - 4.0 K/uL   Monocytes Relative 6 %   Monocytes Absolute 0.8 0.1 - 1.0 K/uL   Eosinophils Relative 0 %   Eosinophils Absolute 0.0 0.0 - 0.5 K/uL   Basophils Relative 0 %   Basophils Absolute 0.0 0.0 - 0.1 K/uL   Immature Granulocytes 1 %   Abs Immature Granulocytes 0.16 (H) 0.00 - 0.07 K/uL    Comment: Performed at [facility], [address] [place], [place] [address]  Sedimentation rate     Status: Abnormal   Collection Time: 02/01/24 12:10 AM  Result Value Ref Range   Sed Rate 139 (H) 0 - 16 mm/hr    Comment: Performed at [facility], [address] [place], [place] [address]  C-reactive protein     Status:  Abnormal   Collection Time: 02/01/24 12:10 AM  Result Value Ref Range   CRP 24.5 (H) <1.0 mg/dL    Comment: Performed at [facility], [address], [place], [place] [address]  I-Stat CG4 Lactic Acid     Status: None   Collection Time: 02/01/24 12:14 AM  Result Value Ref Range   Lactic Acid, Venous 1.5 0.5 - 1.9 mmol/L  Culture, blood (Routine X 2) w Reflex to ID Panel     Status: None (Preliminary result)   Collection Time: 02/01/24 12:36 AM   Specimen: BLOOD  Result Value Ref Range   Specimen Description BLOOD SITE NOT SPECIFIED    Special  Requests      BOTTLES DRAWN AEROBIC AND ANAEROBIC Blood Culture results may not be optimal due to an inadequate volume of blood received in culture bottles   Culture      NO GROWTH < 12 HOURS Performed at [facility], [address], [place], [place] [address]    Report Status PENDING   Magnesium      Status: None   Collection Time: 02/01/24  4:03 AM  Result Value Ref Range   Magnesium  2.0 1.7 - 2.4 mg/dL    Comment: Performed at [facility], [address], [place], [place] [address]  CBC     Status: Abnormal   Collection Time: 02/01/24  4:05 AM  Result Value Ref Range   WBC 10.0 4.0 - 10.5 K/uL   RBC 2.60 (L) 4.22 - 5.81 MIL/uL   Hemoglobin 5.9 (LL) 13.0 - 17.0 g/dL    Comment: REPEATED TO VERIFY THIS CRITICAL RESULT HAS VERIFIED AND BEEN CALLED TO V. PHILER RN BY SAMAYA BYRD ON 04 30 2025 AT 0429, AND HAS BEEN READ BACK.     HCT 20.3 (L) 39.0 - 52.0 %   MCV 78.1 (L) 80.0 - 100.0 fL   MCH 22.7 (L) 26.0 - 34.0 pg   MCHC 29.1 (L) 30.0 - 36.0 g/dL   RDW 91.4 (H) 78.2 - 95.6 %   Platelets 622 (H) 150 - 400 K/uL   nRBC 0.0 0.0 - 0.2 %    Comment: Performed at [facility] Lab, [address], [place], [place] [address]  Comprehensive metabolic panel     Status: Abnormal   Collection Time: 02/01/24  4:05 AM  Result Value Ref Range   Sodium 133 (L) 135 - 145 mmol/L   Potassium 2.7 (LL) 3.5 - 5.1 mmol/L    Comment: CRITICAL RESULT CALLED TO, READ BACK BY AND VERIFIED WITH Z.PHIFER, RN @0450  04.30.25 AALTOM   Chloride 108 98 - 111 mmol/L   CO2 17 (L) 22 - 32 mmol/L   Glucose, Bld 118 (H) 70 - 99 mg/dL    Comment: Glucose reference range applies only to samples taken after fasting for at least 8 hours.   BUN 27 (H) 6 - 20 mg/dL   Creatinine, Ser 6.57 (H) 0.61 - 1.24 mg/dL   Calcium  7.3 (L) 8.9 - 10.3 mg/dL   Total Protein 5.4 (L) 6.5 - 8.1 g/dL   Albumin  1.7 (L) 3.5 - 5.0 g/dL   AST 22 15 - 41 U/L   ALT 12 0 - 44 U/L   Alkaline Phosphatase 84 38 - 126 U/L   Total  Bilirubin 0.9 0.0 - 1.2 mg/dL   GFR, Estimated 44 (L) >60 mL/min    Comment: (NOTE) Calculated using the CKD-EPI Creatinine Equation (2021)    Anion gap  8 5 - 15    Comment: Performed at [facility], [address], [place], [place] [address]  Hemoglobin and hematocrit, blood     Status: Abnormal   Collection Time: 02/01/24  4:39 AM  Result Value Ref Range   Hemoglobin 5.5 (LL) 13.0 - 17.0 g/dL    Comment: REPEATED TO VERIFY THIS CRITICAL RESULT HAS VERIFIED AND BEEN CALLED TO Z.PHIEFER RN BY SAMAYA BYRD ON 04 30 2025 AT 0456, AND HAS BEEN READ BACK.     HCT 19.1 (L) 39.0 - 52.0 %    Comment: Performed at [facility] Lab, [address] [place], [place] [address]  Prepare RBC (crossmatch)     Status: None   Collection Time: 02/01/24  4:58 AM  Result Value Ref Range   Order Confirmation      ORDER PROCESSED BY BLOOD BANK Performed at [facility], [address], [place], [place] [address]   Type and screen [facility]     Status: None (Preliminary result)   Collection Time: 02/01/24  5:06 AM  Result Value Ref Range   ABO/RH(D) A POS    Antibody Screen NEG    Sample Expiration 02/04/2024,2359    Unit Number [id number]    Blood Component Type RED CELLS,LR    Unit division 00    Status of Unit ISSUED    Transfusion Status OK TO TRANSFUSE    Crossmatch Result      Compatible Performed at [facility], [address], [place], [place] [address]    Unit Number [id number]    Blood Component Type RED CELLS,LR    Unit division 00    Status of Unit ISSUED    Transfusion Status OK TO TRANSFUSE    Crossmatch Result Compatible    Unit Number [id number]    Blood Component Type RBC LR PHER1    Unit division 00    Status of Unit ALLOCATED    Transfusion Status OK TO TRANSFUSE    Crossmatch Result Compatible   Cortisol     Status: None   Collection Time: 02/01/24  8:00 AM  Result Value Ref Range   Cortisol, Plasma 19.3 ug/dL     Comment: (NOTE) AM    6.7 - 22.6 ug/dL PM   <32.9       ug/dL Performed at [facility], [address] [place], [place] [address]   TSH     Status: Abnormal   Collection Time: 02/01/24  8:00 AM  Result Value Ref Range   TSH 0.305 (L) 0.350 - 4.500 uIU/mL    Comment: Performed by a 3rd Generation assay with a functional sensitivity of <=0.01 uIU/mL. Performed at [facility], [address] [place], [place] [address]     MICRO:  IMAGING: CT ABDOMEN PELVIS WO CONTRAST Result Date: 01/31/2024 EXAM: CT ABDOMEN AND PELVIS WITHOUT CONTRAST 01/31/2024 11:19:26 PM TECHNIQUE: CT of the abdomen and pelvis was performed without the administration of intravenous contrast. Multiplanar reformatted images are provided for review. Automated exposure control, iterative reconstruction, and/or weight based adjustment of the mA/kV was utilized to reduce the radiation dose to as low as reasonably achievable. COMPARISON: CT pelvis dated 12/23/2023 and CT abdomen/pelvis dated 03/07/2021. CLINICAL HISTORY: Extensive sacral and buttock wounds, concern for possible deep infection. Chief complaints; Fatigue; Weakness; Wound Check. FINDINGS: LOWER CHEST: No acute abnormality. HEPATOBILIARY: The liver is unremarkable. Gallbladder is unremarkable. No biliary ductal dilatation. SPLEEN: No  acute abnormality. PANCREAS: No acute abnormality. ADRENAL GLANDS: No acute abnormality. KIDNEYS, URETERS AND BLADDER: Fragmented staghorn right renal calculus measuring up to 18 mm in the right renal pelvis (image 27), chronic. Clustered left renal calculi measuring up to 13 mm (image 29), chronic. No evidence of hydronephrosis. No evidence of perinephric or periureteral stranding. Urinary bladder is unremarkable. GI AND BOWEL: Status post left hemicolectomy with left mid-abdominal colostomy. Stomach demonstrates no acute abnormality. There is no evidence of bowel obstruction. No evidence of appendicitis. PERITONEUM AND  RETROPERITONEUM: No evidence of ascites. No free air. Aorta is normal in caliber. LYMPH NODES: No evidence of lymphadenopathy. REPRODUCTIVE ORGANS: No acute abnormality. BONES AND SOFT TISSUES: Deep left gluteal ulcer with acute on chronic osteomyelitis involving the left inferior pubic ramus and ischium (image 58) and associated septic joint with fluid/gas in the left hip and progressive destruction of the left femoral head (image 57). Deep right gluteal ulcer with acute on chronic osteomyelitis involving the right inferior pubic ramus with new extension to the right greater trochanter (image 62). Chronic spinal dysraphism. IMPRESSION: 1. Bilateral deep gluteal ulcers with acute on chronic pelvic osteomyelitis and left hip septic joint, as described above, progressive. 2. Additional stable ancillary findings, as above. Electronically signed by: Zadie Herter MD 01/31/2024 11:40 PM EDT RP Workstation: [id number]    HISTORICAL MICRO/IMAGING  Assessment/Plan:  58yo M with acute on chronic pelvic osteomyelitis and now associated septi joint - recommend to stop vancomycin  and transition to daptomycin - will check baseline CK level - not tissue culture to guide us , but in the past has been on broad spectrum - see if IR can aspirate flui collection noted on CT to help guide abtx choice - for the time being continue on piptazo and daptomycin - will check sed rate and crp  Continue with nutritional support and wound care recs  I have personally spent 80 minutes involved in face-to-face and non-face-to-face activities for this patient on the day of the visit. Professional time spent includes the following activities: Preparing to see the patient (review of tests), Obtaining and/or reviewing separately obtained history (admission/discharge record), Performing a medically appropriate examination and/or evaluation , Ordering medications/tests/procedures, referring and communicating with other health care  professionals, Documenting clinical information in the EMR, Independently interpreting results (not separately reported), Communicating results to the patient/family/caregiver, Counseling and educating the patient/family/caregiver and Care coordination (not separately reported).    Standard precautions for now  Strawberry B. Levern Reader MD MPH [facility] [phone]

## 2024-02-01 NOTE — Progress Notes (Signed)
 MEWS Progress Note  Patient Details Name: [name] MRN: [id number] DOB: 1966-05-25 Today's Date: 02/01/2024  Pt alert and oriented x4. Denies symptoms of lightheadedness, dizziness, or chest pain. Pt has IVFs LR @ 95mL/hr running, 1 unit PRBC transfusing, and upcoming IV abx scheduled. Pt currently on telemetry. Charge RN informed of yellow MEWS   MEWS Flowsheet Documentation:  Assess: MEWS Score Temp: (!) 97.3 F (36.3 C) BP: (!) 83/57 MAP (mmHg): 68 Pulse Rate: 71 ECG Heart Rate: 66 Resp: 13 Level of Consciousness: Alert SpO2: 100 % O2 Device: Room Air Assess: MEWS Score MEWS Temp: 0 MEWS Systolic: 1 MEWS Pulse: 0 MEWS RR: 1 MEWS LOC: 0 MEWS Score: 2 MEWS Score Color: Yellow Assess: SIRS CRITERIA SIRS Temperature : 0 SIRS Respirations : 0 SIRS Pulse: 0 SIRS WBC: 0 SIRS Score Sum : 0 SIRS Temperature : 0 SIRS Pulse: 0 SIRS Respirations : 0 SIRS WBC: 0 SIRS Score Sum : 0 Assess: if the MEWS score is Yellow or Red Were vital signs accurate and taken at a resting state?: Yes Does the patient meet 2 or more of the SIRS criteria?: No MEWS guidelines implemented : Yes, yellow Treat MEWS Interventions: Considered administering scheduled or prn medications/treatments as ordered Take Vital Signs Increase Vital Sign Frequency : Yellow: Q2hr x1, continue Q4hrs until patient remains green for 12hrs Escalate MEWS: Escalate: Yellow: Discuss with charge nurse and consider notifying provider and/or RRT Notify: Charge Nurse/RN Name of Charge Nurse/RN Notified: [employer] 02/01/2024, 12:37 PM

## 2024-02-01 NOTE — H&P (Addendum)
 History and Physical    [name] BJY:[id number] DOB: July 29, 1966 DOA: 01/31/2024  PCP: [name], MD   Patient coming from: Home   Chief Complaint:  Chief Complaint  Patient presents with   Fatigue   Weakness   Wound Check   ED TRIAGE note:  Pt BIBEMS from came to ED c/o increased generalized weakness and lethargy for the 2-3 days as per family, also concerned of sacral wound.    HPI:  [name] is a 58 y.o. male with medical history significant of spina bifida status post urostomy/colostomy/neurogenic bladder, chronic sacral decubitus ulcer, chronic osteomyelitis of the ischial tuberosity/inferior pubic ramus, microcytic anemia, prediabetic, essential hypertension, CKD stage II and chronic wheelchair-bound presented to emergency department complaining of generalized weakness for 3 days with associated worsening sacral wound. Patient states that his ostomy bag is always laying on his buttock.  For the last 3 days he is feeling very tired and does not have any energy. Patient denies any fever, chill, nausea, vomiting, abdominal pain, chest pain, shortness of breath. Reported sacral wound cared by patient sister at home.  Unable to obtain much history from the patient seems like that patient has some underlying intellectual disability.  Failed to reach patient's sister who per phone with 3 attempts.  History gathered from chart review and ED physician.  Patient was recently hospitalized 3/21 to 3/28 for sacral decubitus necrotizing soft tissue infection and septic shock required pressor support and patient eventually underwent irrigation debridement on 3/22.  On discharge patient has been transition to IV vancomycin  and Zosyn  to Augmentin  on 3/26 and ID recommended to continue 14 weeks of treatment from the day of debridement.   At presentation to ED patient is tachycardic otherwise hemodynamically stable. CMP showing low potassium 2.2, low  sodium 131, low bicarb 15, low albumin  1.5. CBC showing leukocytosis 14.4, stable H&H 8.3 and 28 and platelet count 566. Elevated CRP 24 and an ESR 139. Blood culture in process. EKG showing sinus rhythm heart rate 97.  CT abdomen pelvis showed: Bilateral deep gluteal ulcer with acute on chronic pelvic osteomyelitis and left hip septic joint, as described above, progressive. 2. Additional stable ancillary findings, as above.  As patient met sepsis criteria in the ED patient has been treated with 1.5 L of LR bolus and currently on LR 150 cc/h.  Patient has been also given IV vancomycin  and Zosyn .  Oral KCl 80 mEq has been given.  ED physician consulted on-call orthopedics Dr. Bernard Brick recommended IV antibiotic and will see patient tomorrow for formal consult.  Hospitalist has been consulted for further evaluation management of sepsis in the setting of acute on chronic osteomyelitis of sacral decubitus ulcer (ischial tuberosity and inferior ramus enthesis, AKI on CKD stage II, metabolic acidosis and hypokalemia.    Significant labs in the ED: Lab Orders         Blood culture (routine x 2)         Culture, blood (Routine X 2) w Reflex to ID Panel         Comprehensive metabolic panel         CBC with Differential         Urinalysis, Routine w reflex microscopic -Urine, Clean Catch         Sedimentation rate         C-reactive protein         CBC         Comprehensive metabolic panel  I-Stat CG4 Lactic Acid       Review of Systems:  Review of Systems  Constitutional:  Negative for chills, fever and weight loss.  Respiratory:  Negative for cough, sputum production and shortness of breath.   Cardiovascular:  Negative for chest pain and palpitations.  Gastrointestinal:  Negative for heartburn, nausea and vomiting.  Genitourinary:  Negative for dysuria, frequency and urgency.  Musculoskeletal:  Negative for back pain and myalgias.  Neurological:  Negative for headaches.     Past Medical History:  Diagnosis Date   Abdominal distention 10/11/2016   Acute lower UTI 12/22/2019   Acute sepsis (HCC) 04/21/2016   AKI (acute kidney injury) (HCC) 06/27/2020   Decubitus ulcer    Distal intestinal obstruction syndrome (HCC) 10/11/2016   Fecal impaction in rectum (HCC) 10/10/2016   Gastric outlet obstruction 06/03/2016   Gastritis    GERD (gastroesophageal reflux disease) 12/22/2019   HTN (hypertension) 06/27/2020   Hydronephrosis    Hyperlipidemia    Hypokalemia 10/11/2016   Impaction of the bowels (HCC)    Kidney stone    Left ureteral stone 06/27/2020   Leukocytosis 10/11/2016   Loose stools 10/11/2016   Nausea & vomiting 06/03/2016   Renal disorder    SBO (small bowel obstruction) (HCC) 11/02/2017   Sepsis due to gram-negative UTI (HCC) 03/08/2021   Septic shock (HCC) 03/08/2021   SIRS (systemic inflammatory response syndrome) (HCC) 06/03/2016   Spina bifida (HCC)    Ulcer of esophagus with bleeding     Past Surgical History:  Procedure Laterality Date   ABDOMINAL SURGERY     APPENDECTOMY     BACK SURGERY     COLON SURGERY     CREATION / REVISION OF ILEOSTOMY / JEJUNOSTOMY  05/2015   CYSTOSCOPY WITH RETROGRADE PYELOGRAM, URETEROSCOPY AND STENT PLACEMENT  03/2014   ESOPHAGOGASTRODUODENOSCOPY (EGD) WITH PROPOFOL  N/A 06/03/2016   Procedure: ESOPHAGOGASTRODUODENOSCOPY (EGD) WITH PROPOFOL ;  Surgeon: Marnee Sink, MD;  Location: [facility] ENDOSCOPY;  Service: Endoscopy;  Laterality: N/A;   ESOPHAGOGASTRODUODENOSCOPY (EGD) WITH PROPOFOL  N/A 04/27/2019   Procedure: ESOPHAGOGASTRODUODENOSCOPY (EGD) WITH PROPOFOL ;  Surgeon: Luke Salaam, MD;  Location: [facility] ENDOSCOPY;  Service: Gastroenterology;  Laterality: N/A;   ilieostomy     IRRIGATION AND DEBRIDEMENT OF NECROTIZING SOFT TISSUE INFECTION Bilateral 12/23/2023   Procedure: IRRIGATION AND DEBRIDEMENT OF NECROTIZING SOFT TISSUE INFECTION;  Surgeon: Edmon Gosling, MD;  Location: [facility] OR;  Service: General;  Laterality: Bilateral;   prone   PERCUTANEOUS NEPHROSTOLITHOTOMY     PERCUTANEOUS NEPHROSTOMY     VENTRICULOPERITONEAL SHUNT     vp shunt removal       reports that he has never smoked. He has never used smokeless tobacco. He reports that he does not drink alcohol and does not use drugs.  Allergies  Allergen Reactions   Morphine And Codeine Nausea And Vomiting   Metrizamide Hives   Sulfa Antibiotics Other (See Comments)    Reaction: unknown   Ivp Dye [Iodinated Contrast Media] Hives   Latex Rash    Family History  Problem Relation Age of Onset   Diabetes Mellitus II Father    Bladder Cancer Neg Hx    Kidney cancer Neg Hx    Prostate cancer Neg Hx     Prior to Admission medications   Medication Sig Start Date End Date Taking? Authorizing Provider  Cranberry-Vitamin C -Probiotic (AZO CRANBERRY PO) Take 1 tablet by mouth daily.   Yes [provider]  midodrine  (PROAMATINE ) 10 MG tablet Take 1 tablet (10 mg  total) by mouth 3 (three) times daily with meals. 12/30/23  Yes Ghimire, Estil Heman, MD  Multiple Vitamins-Minerals (CENTRUM MEN PO) Take 1 tablet by mouth daily.    Yes [provider]  pantoprazole  (PROTONIX ) 40 MG tablet Take 1 tablet (40 mg total) by mouth daily. 04/29/19  Yes Sainani, Vivek J, MD  potassium chloride  SA (KLOR-CON  M) 20 MEQ tablet Take 20 mEq by mouth 2 (two) times daily. 01/31/24  Yes [provider]  QUEtiapine  (SEROQUEL ) 25 MG tablet Take 25 mg by mouth at bedtime. 07/29/23 01/30/25 Yes [provider]  rosuvastatin  (CRESTOR ) 10 MG tablet Take 10 mg by mouth at bedtime. 10/20/19  Yes [provider]  sucralfate  (CARAFATE ) 1 g tablet Take 1 g by mouth 3 (three) times daily. 12/14/19  Yes [provider]  tamsulosin (FLOMAX) 0.4 MG CAPS capsule Take 0.4 mg by mouth daily. 01/31/24  Yes [provider]  tiZANidine (ZANAFLEX) 4 MG tablet Take 4 mg by mouth every 8 (eight) hours as needed for muscle spasms. 07/29/23  Yes [provider]  dicyclomine  (BENTYL ) 20 MG tablet Take 20 mg by mouth 2 (two) times daily.    [provider]     Physical Exam: Vitals:   01/31/24 2146 02/01/24 0100 02/01/24 0200  BP: 122/76 (!) 91/57 (!) 94/51  Pulse: (!) 111 89 82  Resp: 18 (!) 21 (!) 25  Temp: 98.6 F (37 C) 98.7 F (37.1 C)   TempSrc: Oral Oral   SpO2: 100% 100% 100%  Weight: 46 kg    Height: 4\' 6"  (1.372 m)      Physical Exam Vitals and nursing note reviewed.  Constitutional:      General: He is not in acute distress.    Appearance: He is ill-appearing. He is not toxic-appearing.  HENT:     Mouth/Throat:     Mouth: Mucous membranes are dry.  Cardiovascular:     Rate and Rhythm: Normal rate and regular rhythm.     Pulses: Normal pulses.     Heart sounds: Normal heart sounds.  Pulmonary:     Effort: Pulmonary effort is normal.     Breath sounds: Normal breath sounds.  Abdominal:     General: Bowel sounds are normal.     Comments: Colostomy bag in place and filled with greenish liquidy stool.   Bilateral urostomy bag filled with amber-colored urine.  Musculoskeletal:     Cervical back: Neck supple.     Right lower leg: No edema.     Left lower leg: No edema.     Comments: Stage IV sacral decubitus ulcer  Skin:    General: Skin is dry.     Capillary Refill: Capillary refill takes less than 2 seconds.  Neurological:     Comments: Alert to name and place.  Unknown baseline  Psychiatric:        Mood and Affect: Mood normal.      Labs on Admission: I have personally reviewed following labs and imaging studies  CBC: Recent Labs  Lab 02/01/24 0010  WBC 14.4*  NEUTROABS 11.8*  HGB 8.3*  HCT 28.3*  MCV 77.3*  PLT 566*   Basic Metabolic Panel: Recent Labs  Lab 02/01/24 0010  NA 131*  K 2.2*  CL 102  CO2 15*  GLUCOSE 114*  BUN 32*  CREATININE 1.98*  CALCIUM  7.9*   GFR: Estimated Creatinine Clearance: 23.6 mL/min (A) (by C-G formula based on SCr of 1.98 mg/dL  (H)).  Liver Function Tests: Recent Labs  Lab 02/01/24 0010  AST 27  ALT 18  ALKPHOS 127*  BILITOT 0.6  PROT 6.6  ALBUMIN  <1.5*   No results for input(s): "LIPASE", "AMYLASE" in the last 168 hours. No results for input(s): "AMMONIA" in the last 168 hours. Coagulation Profile: No results for input(s): "INR", "PROTIME" in the last 168 hours. Cardiac Enzymes: No results for input(s): "CKTOTAL", "CKMB", "CKMBINDEX", "TROPONINI", "TROPONINIHS" in the last 168 hours. BNP (last 3 results) No results for input(s): "BNP" in the last 8760 hours. HbA1C: No results for input(s): "HGBA1C" in the last 72 hours. CBG: No results for input(s): "GLUCAP" in the last 168 hours. Lipid Profile: No results for input(s): "CHOL", "HDL", "LDLCALC", "TRIG", "CHOLHDL", "LDLDIRECT" in the last 72 hours. Thyroid  Function Tests: No results for input(s): "TSH", "T4TOTAL", "FREET4", "T3FREE", "THYROIDAB" in the last 72 hours. Anemia Panel: No results for input(s): "VITAMINB12", "FOLATE", "FERRITIN", "TIBC", "IRON", "RETICCTPCT" in the last 72 hours. Urine analysis:    Component Value Date/Time   COLORURINE AMBER (A) 01/31/2024 0000   APPEARANCEUR CLOUDY (A) 01/31/2024 0000   APPEARANCEUR Cloudy (A) 03/21/2019 1130   LABSPEC 1.008 01/31/2024 0000   PHURINE 8.0 01/31/2024 0000   GLUCOSEU NEGATIVE 01/31/2024 0000   HGBUR SMALL (A) 01/31/2024 0000   BILIRUBINUR NEGATIVE 01/31/2024 0000   BILIRUBINUR Negative 03/21/2019 1130   KETONESUR NEGATIVE 01/31/2024 0000   PROTEINUR 100 (A) 01/31/2024 0000   NITRITE NEGATIVE 01/31/2024 0000   LEUKOCYTESUR LARGE (A) 01/31/2024 0000    Radiological Exams on Admission: I have personally reviewed images CT ABDOMEN PELVIS WO CONTRAST Result Date: 01/31/2024 EXAM: CT ABDOMEN AND PELVIS WITHOUT CONTRAST 01/31/2024 11:19:26 PM TECHNIQUE: CT of the abdomen and pelvis was performed without the administration of intravenous contrast. Multiplanar reformatted images are  provided for review. Automated exposure control, iterative reconstruction, and/or weight based adjustment of the mA/kV was utilized to reduce the radiation dose to as low as reasonably achievable. COMPARISON: CT pelvis dated 12/23/2023 and CT abdomen/pelvis dated 03/07/2021. CLINICAL HISTORY: Extensive sacral and buttock wounds, concern for possible deep infection. Chief complaints; Fatigue; Weakness; Wound Check. FINDINGS: LOWER CHEST: No acute abnormality. HEPATOBILIARY: The liver is unremarkable. Gallbladder is unremarkable. No biliary ductal dilatation. SPLEEN: No acute abnormality. PANCREAS: No acute abnormality. ADRENAL GLANDS: No acute abnormality. KIDNEYS, URETERS AND BLADDER: Fragmented staghorn right renal calculus measuring up to 18 mm in the right renal pelvis (image 27), chronic. Clustered left renal calculi measuring up to 13 mm (image 29), chronic. No evidence of hydronephrosis. No evidence of perinephric or periureteral stranding. Urinary bladder is unremarkable. GI AND BOWEL: Status post left hemicolectomy with left mid-abdominal colostomy. Stomach demonstrates no acute abnormality. There is no evidence of bowel obstruction. No evidence of appendicitis. PERITONEUM AND RETROPERITONEUM: No evidence of ascites. No free air. Aorta is normal in caliber. LYMPH NODES: No evidence of lymphadenopathy. REPRODUCTIVE ORGANS: No acute abnormality. BONES AND SOFT TISSUES: Deep left gluteal ulcer with acute on chronic osteomyelitis involving the left inferior pubic ramus and ischium (image 58) and associated septic joint with fluid/gas in the left hip and progressive destruction of the left femoral head (image 57). Deep right gluteal ulcer with acute on chronic osteomyelitis involving the right inferior pubic ramus with new extension to the right greater trochanter (image 62). Chronic spinal dysraphism. IMPRESSION: 1. Bilateral deep gluteal ulcers with acute on chronic pelvic osteomyelitis and left hip septic  joint, as described above, progressive. 2. Additional stable ancillary findings, as above. Electronically signed by:  Zadie Herter MD 01/31/2024 11:40 PM EDT RP Workstation: [id number]     EKG: My personal interpretation of EKG shows: EKG showing normal sinus rhythm heart rate 97 and prolonged QTc.    Assessment/Plan: Principal Problem:   Acute on chronic osteomyelitis of sacrum (HCC) Active Problems:   Chronic hypokalemia   Acute kidney injury superimposed on chronic kidney disease (HCC)   Sepsis (HCC)   Metabolic acidosis   Chronic hypotension   Essential hypertension   History of spina bifida   Colostomy in place [facility])   History of urostomy   Sacral decubitus ulcer, stage IV (HCC)   Microcytic anemia   CKD (chronic kidney disease) stage 2, GFR 60-89 ml/min   Generalized anxiety disorder    Assessment and Plan: Sepsis secondary to osteomyelitis Acute on chronic osteomyelitis of the ischial tuberosity/inferior pubic ramus Left hip septic joint Spina bifida with sacral decubitus ulcer stage IV -Patient has been presenting with complaining of generalized weakness and worsening sacral decubitus ulcer infection. -At presentation to ED patient is tachycardic, hypotensive and CT abdomen pelvis showed acute on on chronic osteomyelitis.  Lactic acid within normal range.  CBC showing leukocytosis.  Patient is afebrile - CT scan showed bilateral deep tissue gluteal ulcer with acute on chronic pelvic osteomyelitis and left hip septic joint as described above and has been progressive. --Patient was admitted 1 month ago with similar clinical presentation developed septic shock secondary to necrotizing soft tissue infection of the sacral decubitus ulcer required debridement and washout treated with IV vancomycin  and cefepime  and on discharge patient has been prescribed Augmentin  on 3/26 and ID recommended to complete for 14 weeks from the day from debridement.  However patient has not been  taking the Augmentin ? -Orthopedic surgeon Dr. Bernard Brick has been consulted by ED physician recommended IV antibiotic and will see patient tomorrow for formal consult. - In ED patient has been treated with with IV vancomycin  and Zosyn , 1.5 L of LR bolus and oral KCl. - Given patient is persistently hypotensive giving another liter of LR bolus based on sepsis protocol and continue LR 150 cc/h.  Continue midodrine  10 mg 3 times daily -New broad-spectrum antibiotic coverage with IV vancomycin  and Zosyn . - Need to follow-up with blood culture result and please consult infectious disease in the daytime. -Also consulted wound care.  Addendum -  Acute on chronic anemia - Hemoglobin has been dropped 8.3 to 5.9.  There is no external evidence of bleeding.  Repeated H&H again it is 5.5. -Transfusing 2 units of blood. -Patient has intellectual disability.  Unable to obtain any consent from patient's sister over phone as she is not picking of the phone. -Per chart review patient previously has been transfused in March 2025 and 2020 without any blood transfusion related infection.   -Given it is a medical necessary to transfuse blood in the setting of low hemoglobin hypotension and sepsis transfusing 2 units of blood this morning.    Acute on chronic hypokalemia -Patient has history of chronic hypokalemia takes oral KCl 20 mEq twice daily.  Potassium 2.2.  In the ED patient has been given 80 oral KCl.  Giving another IV KCl 10 mEq x 4 doses and continue oral KCl.  Chronic hypotension History of essential hypertension-blood pressure regimen since March 2025 due to persistent hypotension -Continue midodrine  10 mg 3 times daily  Prolonged QTc EKG showing prolonged QTc 599. - Continue to avoid any QT prolonging medication. Continue to replete electrolytes.  Hyponatremia -Low sodium 131 due  to high ostomy output.  Continue to monitor.  Currently on LR 150 cc/h.  History of spina bifida with a stage IV  ulcer History of spina bifida status post urostomy/colostomy and wheelchair-bound Neurogenic bladder -Patient lives with sister and dependent on her for ADLS -Continue ostomy care.  Acute kidney injury CKD stage 2 -Elevated creatinine 1.98.  Acute kidney injury in the setting of sepsis. - Continue maintenance fluid, avoid nephrotoxic agent and monitor urine output.  Metabolic acidosis secondary to AKI -Metabolic acidosis in the setting of acute kidney injury. - Currently managing for sepsis and AKI with IV fluid resuscitation.  Starting oral bicarb 650 mg 3 times daily for 1 day.  Prediabetic - Continue heart healthy carb modified diet.  Neurogenic bladder -Continue Flomax  DVT prophylaxis:  SCDs.  Deferring pharmacological DVT prophylaxis in case patient will undergo debridement of the ischial tuberosity osteomyelitis area. Code Status:  Full Code by default. Diet: Heart healthy and carb modified diet  Family Communication: Patient Sister Camillo Celestine is POA and unsuccessful attempt to reach patient's sister over phone. Disposition Plan: Continue IV antibiotic, pending blood culture and possible patient will undergo washout and debridement of the tibial tuberosity osteomyelitis. Consults: Orthopedic surgeon Admission status:   Inpatient, Telemetry bed  Severity of Illness: The appropriate patient status for this patient is INPATIENT. Inpatient status is judged to be reasonable and necessary in order to provide the required intensity of service to ensure the patient's safety. The patient's presenting symptoms, physical exam findings, and initial radiographic and laboratory data in the context of their chronic comorbidities is felt to place them at high risk for further clinical deterioration. Furthermore, it is not anticipated that the patient will be medically stable for discharge from the hospital within 2 midnights of admission.   * I certify that at the point of admission it is my  clinical judgment that the patient will require inpatient hospital care spanning beyond 2 midnights from the point of admission due to high intensity of service, high risk for further deterioration and high frequency of surveillance required.Aaron Aas    Ilham Roughton, MD [facility]  How to contact the [facility] Attending or Consulting provider 7A - 7P or covering provider during after hours 7P -7A, for this patient.  Check the care team in [facility] and look for a) attending/consulting [facility] provider listed and b) the [facility] team listed Log into [email or web address] and use [facility] universal password to access. If you do not have the password, please contact the hospital operator. Locate the [facility] provider you are looking for under [facility] and page to a number that you can be directly reached. If you still have difficulty reaching the provider, please page the [facility] (Director on Call) for the Hospitalists listed on amion for assistance.  02/01/2024, 2:37 AM

## 2024-02-01 NOTE — Progress Notes (Signed)
 RN reported that hemoglobin is 5.9.  Patient does not have any external bleeding.  Hemoglobin is around 8.3 lab draw at 12 AM.  Rechecking H&H again to making sure hemoglobin actually has been dropped.. Need to collect from a peripheral vein.  Chase Sow, MD [facility] 02/01/2024, 4:33 AM

## 2024-02-02 DIAGNOSIS — N179 Acute kidney failure, unspecified: Secondary | ICD-10-CM | POA: Diagnosis not present

## 2024-02-02 DIAGNOSIS — E872 Acidosis, unspecified: Secondary | ICD-10-CM | POA: Diagnosis not present

## 2024-02-02 DIAGNOSIS — N182 Chronic kidney disease, stage 2 (mild): Secondary | ICD-10-CM

## 2024-02-02 DIAGNOSIS — D649 Anemia, unspecified: Secondary | ICD-10-CM | POA: Diagnosis not present

## 2024-02-02 DIAGNOSIS — M4628 Osteomyelitis of vertebra, sacral and sacrococcygeal region: Secondary | ICD-10-CM | POA: Diagnosis not present

## 2024-02-02 LAB — CBC
HCT: 37.1 % — ABNORMAL LOW (ref 39.0–52.0)
Hemoglobin: 11.8 g/dL — ABNORMAL LOW (ref 13.0–17.0)
MCH: 25.8 pg — ABNORMAL LOW (ref 26.0–34.0)
MCHC: 31.8 g/dL (ref 30.0–36.0)
MCV: 81.2 fL (ref 80.0–100.0)
Platelets: 512 10*3/uL — ABNORMAL HIGH (ref 150–400)
RBC: 4.57 MIL/uL (ref 4.22–5.81)
RDW: 18 % — ABNORMAL HIGH (ref 11.5–15.5)
WBC: 8.4 10*3/uL (ref 4.0–10.5)
nRBC: 0 % (ref 0.0–0.2)

## 2024-02-02 LAB — RENAL FUNCTION PANEL
Albumin: 2.2 g/dL — ABNORMAL LOW (ref 3.5–5.0)
Anion gap: 11 (ref 5–15)
BUN: 17 mg/dL (ref 6–20)
CO2: 14 mmol/L — ABNORMAL LOW (ref 22–32)
Calcium: 8 mg/dL — ABNORMAL LOW (ref 8.9–10.3)
Chloride: 117 mmol/L — ABNORMAL HIGH (ref 98–111)
Creatinine, Ser: 1.39 mg/dL — ABNORMAL HIGH (ref 0.61–1.24)
GFR, Estimated: 59 mL/min — ABNORMAL LOW (ref >60)
Glucose, Bld: 95 mg/dL (ref 70–99)
Phosphorus: 2.5 mg/dL (ref 2.5–4.6)
Potassium: 3.4 mmol/L — ABNORMAL LOW (ref 3.5–5.1)
Sodium: 142 mmol/L (ref 135–145)

## 2024-02-02 LAB — CK: Total CK: 27 U/L — ABNORMAL LOW (ref 49–397)

## 2024-02-02 MED ORDER — ALBUMIN HUMAN 25 % IV SOLN
25.0000 g | INTRAVENOUS | Status: AC
  Administered 2024-02-02: 25 g via INTRAVENOUS
  Filled 2024-02-02: qty 100

## 2024-02-02 MED ORDER — MIDODRINE HCL 5 MG PO TABS
15.0000 mg | ORAL_TABLET | Freq: Three times a day (TID) | ORAL | Status: DC
Start: 2024-02-02 — End: 2024-02-07
  Administered 2024-02-02 – 2024-02-04 (×8): 15 mg via ORAL
  Filled 2024-02-02 (×9): qty 3

## 2024-02-02 MED ORDER — MIDODRINE HCL 5 MG PO TABS
10.0000 mg | ORAL_TABLET | ORAL | Status: AC
  Administered 2024-02-02: 10 mg via ORAL
  Filled 2024-02-02: qty 2

## 2024-02-02 MED ORDER — SODIUM CHLORIDE 0.9 % IV BOLUS
1000.0000 mL | INTRAVENOUS | Status: AC
  Administered 2024-02-02: 1000 mL via INTRAVENOUS

## 2024-02-02 MED ORDER — METRONIDAZOLE 500 MG PO TABS
500.0000 mg | ORAL_TABLET | Freq: Two times a day (BID) | ORAL | Status: DC
  Administered 2024-02-02 – 2024-02-04 (×4): 500 mg via ORAL
  Filled 2024-02-02 (×4): qty 1

## 2024-02-02 MED ORDER — POTASSIUM CHLORIDE CRYS ER 20 MEQ PO TBCR
60.0000 meq | EXTENDED_RELEASE_TABLET | Freq: Once | ORAL | Status: AC
  Administered 2024-02-02: 60 meq via ORAL
  Filled 2024-02-02: qty 3

## 2024-02-02 MED ORDER — QUETIAPINE FUMARATE 25 MG PO TABS
25.0000 mg | ORAL_TABLET | Freq: Every day | ORAL | Status: DC
  Administered 2024-02-02 – 2024-02-03 (×2): 25 mg via ORAL
  Filled 2024-02-02 (×2): qty 1

## 2024-02-02 MED ORDER — LINEZOLID 600 MG PO TABS
600.0000 mg | ORAL_TABLET | Freq: Two times a day (BID) | ORAL | Status: DC
  Administered 2024-02-02 – 2024-02-04 (×4): 600 mg via ORAL
  Filled 2024-02-02 (×6): qty 1

## 2024-02-02 MED ORDER — SODIUM CHLORIDE 0.9 % IV SOLN
2.0000 g | Freq: Two times a day (BID) | INTRAVENOUS | Status: DC
  Administered 2024-02-02 – 2024-02-04 (×4): 2 g via INTRAVENOUS
  Filled 2024-02-02 (×4): qty 12.5

## 2024-02-02 NOTE — Progress Notes (Addendum)
 PHARMACY CONSULT NOTE FOR:  OUTPATIENT  PARENTERAL ANTIBIOTIC THERAPY (OPAT)  Indication: Pelvic osteomyelitis Regimen: Cefepime  2 gm IV q 12 hours + Linezolid  600 mg PO Q 12 hours + Metronidazole  500 mg PO Q 12 hours  End date: 02/16/24  IV antibiotic discharge orders are pended. To discharging provider:  please sign these orders via discharge navigator,  Select New Orders & click on the button choice - Manage This Unsigned Work.     Thank you for allowing pharmacy to be a part of this patient's care.  Denson Flake, PharmD, BCPS, BCIDP Infectious Diseases Clinical Pharmacist Phone: [phone] 02/02/2024, 4:01 PM

## 2024-02-02 NOTE — Progress Notes (Signed)
 Interventional Radiology Brief Note:  IR consulted for hip vs. Soft tissue aspiration.  Case reviewed by Dr. Mabel Savage who notes progressively worsening osteo/infection from his sacral wound over the past several years demonstrated on his imaging. He has a positive culture from surgical debridement 3/21. Aspiration deferred at this time.   Danaija Eskridge, MS RD PA-C 12:03 PM

## 2024-02-02 NOTE — Plan of Care (Signed)

## 2024-02-02 NOTE — Progress Notes (Signed)
 PROGRESS NOTE  [name] ZOX:[id number] DOB: [birth year]   PCP: [name], MD  Patient is from: Home.  Lives with sister.  Wheelchair-bound at baseline.  DOA: 01/31/2024 LOS: 1  Chief complaints Chief Complaint  Patient presents with   Fatigue   Weakness   Wound Check     Brief Narrative / Interim history: 58 year old M with PMH of spina bifid/neurogenic bladder with urostomy and colostomy, chronic sacral decubitus, chronic osteomyelitis of ischial tuberosity, inferior pubic ramus, HTN, anemia and recent hospitalization from 3/21-3/28 for septic shock in the setting of necrotizing soft tissue infection and sacral decubitus for which she was treated with vasopressor, broad-spectrum antibiotics and discharged on p.o. Augmentin  for 14 weeks per recommendation by ID.    Patient presents with generalized weakness.  Workup in ED concerning for acute on chronic osteomyelitis, acute on chronic anemia, AKI and hypokalemia.  He had mild leukocytosis to 14.4.  Hgb dropped from 8.3 (baseline) to 5.9 and 5.5 on repeat.  Cr 1.98 (was 1.0 on 3/27).  K2.2.  CT abdomen and pelvis concerning bilateral deep gluteal ulcers with acute on chronic pelvic osteomyelitis and progressive left hip septic joint.  Blood cultures obtained.  Started on broad-spectrum antibiotics and potassium supplementation.  2 units of blood ordered.  Orthopedic surgery consulted  The next day, transfused 2 units and hemoglobin improved to 11.5.  Evaluated by orthopedic surgery who did not feel there is need for surgical intervention.  ID following.  Subjective: Seen and examined earlier this morning.  No major events overnight of this morning.  Soft blood pressures overnight.  Patient has no complaints but not a great historian.  Objective: Vitals:   02/02/24 0408 02/02/24 0656 02/02/24 0829 02/02/24 1201  BP:  (!) 90/58 (!) 81/61 96/66  Pulse:  62 70 68  Resp:      Temp:   98.4 F (36.9 C)   TempSrc:    Oral   SpO2:   100%   Weight: 46.2 kg     Height:        Examination:  GENERAL: No apparent distress.  Nontoxic. HEENT: MMM.  Vision and hearing grossly intact.  NECK: Supple.  No apparent JVD.  RESP:  No IWOB.  Fair aeration bilaterally. CVS:  RRR. Heart sounds normal.  ABD/GI/GU: BS+. Abd soft.  RLQ urostomy.  LLQ colostomy. MSK/EXT: BLE deformity and atrophy. SKIN: Unstageable sacral decubitus.  See picture under media. NEURO: Awake and alert.  Oriented to self, place, month and year.  No apparent focal neuro deficit. PSYCH: Calm. Normal affect.   Consultants:  Orthopedic surgery Infectious disease  Procedures: None  Microbiology summarized: Blood cultures NGTD  Assessment and plan: Sepsis secondary to acute on chronic osteomyelitis of the ischial tuberosity/inferior pubic ramus Left hip septic joint Spina bifida with sacral decubitus ulcer stage IV -Recently hospitalized for septic shock and discharged on Augmentin  for 14 weeks -CT showed bilateral deep gluteal ulcers with acute on chronic pelvic osteo and progressive left hip septic joint.  . -CRP 25.  ESR 140.  Blood cultures NGTD -Per Ortho, no good surgical remedy -ID suggested IR drainage of left hip but recommended treating based on positive culture on 3/21 -Now on daptomycin  and Zosyn  per ID -ID recommends SNF placement for wound care -Wound care  Acute on chronic anemia: Hgb dropped from 8.3-5.5.  Likely dilutional from IV fluid resuscitation.  No overt bleeding.  Improved to 11.6 after 2 units. Recent Labs    12/25/23 2326  12/26/23 0239 12/27/23 1107 12/28/23 0122 12/28/23 0505 02/01/24 0010 02/01/24 0405 02/01/24 0439 02/01/24 1942 02/02/24 0535  HGB 8.4* 8.5* 8.7* 8.6* 8.4* 8.3* 5.9* 5.5* 11.6* 11.8*  -Continue monitoring  AKI: B/l Cr ~1.0.  Likely hypovolemic.  Not on nephrotoxic meds.  Fair amount of urine in urostomy bag. Recent Labs    12/24/23 0243 12/25/23 0330 12/26/23 0239  12/27/23 1107 12/28/23 0103 12/29/23 0400 02/01/24 0010 02/01/24 0405 02/01/24 1942 02/02/24 0552  BUN 18 15 16 13 14 11  32* 27* 22* 17  CREATININE 1.20 1.08 1.59* 1.38* 1.21 1.07 1.98* 1.78* 1.52* 1.39*  - Avoid nephrotoxic meds. - Increased midodrine  for BP support - Closely monitor urine output   Chronic hypotension: On midodrine  at home.  Cortisol within normal.  TSH slightly low but free T4 normal.  Normal TTE in 2017. -Increased midodrine  from 10 to 15 mg 3 times daily on 4/30 -Discontinued Flomax .   Prolonged QTc: QTc 599 in the setting of severe hypokalemia.  Improved to 470. - Avoid or minimize QT prolonging drugs - Continue telemetry - Optimize electrolytes  Hypokalemia: Improved. - Monitor replenish as appropriate   Hyponatremia: In the setting of renal failure, hypotension.  Resolved. -Continue monitoring   History of spina bifida with a stage IV ulcer, neurogenic bladder, urostomy and colostomy -Continue wound and ostomy care -Discontinue Flomax .  Does not help with neurogenic bladder   Non-anion gap metabolic acidosis: Likely due to renal failure.  Improved -Manage renal failure as above -Continue sodium bicarbonate    Body mass index is 24.56 kg/m.         Unstageable pressure skin injury of bilateral buttocks: Present on arrival Pressure Injury 12/24/23 Buttocks Right (Active)  12/24/23 0100  Location: Buttocks  Location Orientation: Right  Staging:   Wound Description (Comments):   Present on Admission: Yes     Pressure Injury 12/24/23 Buttocks Left (Active)  12/24/23 0100  Location: Buttocks  Location Orientation: Left  Staging:   Wound Description (Comments):   Present on Admission: Yes   DVT prophylaxis:  SCDs Start: 02/01/24 0204 Place TED hose Start: 02/01/24 0204  Code Status: Full code Family Communication: Updated patient's sister over the phone. Level of care: Telemetry Medical Status is: Inpatient Remains inpatient  appropriate because: Acute on chronic osteomyelitis, anemia, AKI and hypokalemia   Final disposition: Likely home once medically stable   55 minutes with more than 50% spent in reviewing records, counseling patient/family and coordinating care.   Sch Meds:  Scheduled Meds:  leptospermum manuka honey  1 Application Topical Daily   midodrine   15 mg Oral TID WC   pantoprazole   40 mg Oral Daily   QUEtiapine   25 mg Oral QHS   rosuvastatin   10 mg Oral QHS   sodium chloride  flush  3 mL Intravenous Q12H   sucralfate   1 g Oral TID   Continuous Infusions:  DAPTOmycin  350 mg (02/01/24 1820)   piperacillin -tazobactam (ZOSYN )  IV 3.375 g (02/02/24 0622)   PRN Meds:.acetaminophen  **OR** acetaminophen , prochlorperazine , sodium chloride  flush  Antimicrobials: Anti-infectives (From admission, onward)    Start     Dose/Rate Route Frequency Ordered Stop   02/01/24 1815  DAPTOmycin  (CUBICIN ) 350 mg in sodium chloride  0.9 % IVPB        8 mg/kg  46 kg 114 mL/hr over 30 Minutes Intravenous Daily 02/01/24 1719     02/01/24 0700  piperacillin -tazobactam (ZOSYN ) IVPB 3.375 g        3.375 g 12.5 mL/hr over  240 Minutes Intravenous Every 8 hours 02/01/24 0254     02/01/24 0348  vancomycin  variable dose per unstable renal function (pharmacist dosing)  Status:  Discontinued         Does not apply See admin instructions 02/01/24 0348 02/01/24 1657   02/01/24 0015  vancomycin  (VANCOCIN ) IVPB 1000 mg/200 mL premix        1,000 mg 200 mL/hr over 60 Minutes Intravenous  Once 02/01/24 0010 02/01/24 0242   02/01/24 0015  piperacillin -tazobactam (ZOSYN ) IVPB 3.375 g        3.375 g 100 mL/hr over 30 Minutes Intravenous  Once 02/01/24 0010 02/01/24 0148        I have personally reviewed the following labs and images: CBC: Recent Labs  Lab 02/01/24 0010 02/01/24 0405 02/01/24 0439 02/01/24 1942 02/02/24 0535  WBC 14.4* 10.0  --  8.0 8.4  NEUTROABS 11.8*  --   --   --   --   HGB 8.3* 5.9* 5.5* 11.6*  11.8*  HCT 28.3* 20.3* 19.1* 37.6* 37.1*  MCV 77.3* 78.1*  --  82.8 81.2  PLT 566* 622*  --  379 512*   BMP &GFR Recent Labs  Lab 02/01/24 0010 02/01/24 0403 02/01/24 0405 02/01/24 1942 02/02/24 0552  NA 131*  --  133* 138 142  K 2.2*  --  2.7* 5.0 3.4*  CL 102  --  108 117* 117*  CO2 15*  --  17* 11* 14*  GLUCOSE 114*  --  118* 144* 95  BUN 32*  --  27* 22* 17  CREATININE 1.98*  --  1.78* 1.52* 1.39*  CALCIUM  7.9*  --  7.3* 7.7* 8.0*  MG  --  2.0  --   --   --   PHOS  --   --   --  2.1* 2.5   Estimated Creatinine Clearance: 33.7 mL/min (A) (by C-G formula based on SCr of 1.39 mg/dL (H)). Liver & Pancreas: Recent Labs  Lab 02/01/24 0010 02/01/24 0405 02/01/24 1942 02/02/24 0552  AST 27 22  --   --   ALT 18 12  --   --   ALKPHOS 127* 84  --   --   BILITOT 0.6 0.9  --   --   PROT 6.6 5.4*  --   --   ALBUMIN  <1.5* 1.7* 1.6* 2.2*   No results for input(s): "LIPASE", "AMYLASE" in the last 168 hours. No results for input(s): "AMMONIA" in the last 168 hours. Diabetic: No results for input(s): "HGBA1C" in the last 72 hours. No results for input(s): "GLUCAP" in the last 168 hours. Cardiac Enzymes: Recent Labs  Lab 02/02/24 0552  CKTOTAL 27*   No results for input(s): "PROBNP" in the last 8760 hours. Coagulation Profile: No results for input(s): "INR", "PROTIME" in the last 168 hours. Thyroid  Function Tests: Recent Labs    02/01/24 0800 02/01/24 1942  TSH 0.305*  --   FREET4  --  0.88   Lipid Profile: No results for input(s): "CHOL", "HDL", "LDLCALC", "TRIG", "CHOLHDL", "LDLDIRECT" in the last 72 hours. Anemia Panel: No results for input(s): "VITAMINB12", "FOLATE", "FERRITIN", "TIBC", "IRON", "RETICCTPCT" in the last 72 hours. Urine analysis:    Component Value Date/Time   COLORURINE AMBER (A) 01/31/2024 0000   APPEARANCEUR CLOUDY (A) 01/31/2024 0000   APPEARANCEUR Cloudy (A) 03/21/2019 1130   LABSPEC 1.008 01/31/2024 0000   PHURINE 8.0 01/31/2024 0000    GLUCOSEU NEGATIVE 01/31/2024 0000   HGBUR SMALL (A) 01/31/2024 0000  BILIRUBINUR NEGATIVE 01/31/2024 0000   BILIRUBINUR Negative 03/21/2019 1130   KETONESUR NEGATIVE 01/31/2024 0000   PROTEINUR 100 (A) 01/31/2024 0000   NITRITE NEGATIVE 01/31/2024 0000   LEUKOCYTESUR LARGE (A) 01/31/2024 0000   Sepsis Labs: Invalid input(s): "PROCALCITONIN", "LACTICIDVEN"  Microbiology: Recent Results (from the past 240 hours)  Blood culture (routine x 2)     Status: None (Preliminary result)   Collection Time: 02/01/24 12:04 AM   Specimen: BLOOD  Result Value Ref Range Status   Specimen Description BLOOD SITE NOT SPECIFIED  Final   Special Requests   Final    BOTTLES DRAWN AEROBIC AND ANAEROBIC Blood Culture results may not be optimal due to an inadequate volume of blood received in culture bottles   Culture   Final    NO GROWTH 1 DAY Performed at [facility], [address], [place], [place] [address]    Report Status PENDING  Incomplete  Culture, blood (Routine X 2) w Reflex to ID Panel     Status: None (Preliminary result)   Collection Time: 02/01/24 12:36 AM   Specimen: BLOOD  Result Value Ref Range Status   Specimen Description BLOOD SITE NOT SPECIFIED  Final   Special Requests   Final    BOTTLES DRAWN AEROBIC AND ANAEROBIC Blood Culture results may not be optimal due to an inadequate volume of blood received in culture bottles   Culture   Final    NO GROWTH 1 DAY Performed at [facility], [address], [place], [place] [address]    Report Status PENDING  Incomplete  MRSA Next Gen by PCR, Nasal     Status: None   Collection Time: 02/01/24  2:44 PM   Specimen: Nasal Mucosa; Nasal Swab  Result Value Ref Range Status   MRSA by PCR Next Gen NOT DETECTED NOT DETECTED Final    Comment: (NOTE) The GeneXpert MRSA Assay (FDA approved for NASAL specimens only), is one component of a comprehensive MRSA colonization surveillance program. It is not intended to diagnose MRSA  infection nor to guide or monitor treatment for MRSA infections. Test performance is not FDA approved in patients less than 38 years old. Performed at [facility], [address] [place], [place] [address]     Radiology Studies: No results found.     Kamaree Wheatley T. Ming Mcmannis [facility]  If 7PM-7AM, please contact night-coverage [email or web address] 02/02/2024, 12:20 PM

## 2024-02-03 ENCOUNTER — Other Ambulatory Visit: Payer: Self-pay

## 2024-02-03 DIAGNOSIS — N179 Acute kidney failure, unspecified: Secondary | ICD-10-CM | POA: Diagnosis not present

## 2024-02-03 DIAGNOSIS — M4628 Osteomyelitis of vertebra, sacral and sacrococcygeal region: Secondary | ICD-10-CM | POA: Diagnosis not present

## 2024-02-03 DIAGNOSIS — D649 Anemia, unspecified: Secondary | ICD-10-CM | POA: Diagnosis not present

## 2024-02-03 DIAGNOSIS — E872 Acidosis, unspecified: Secondary | ICD-10-CM | POA: Diagnosis not present

## 2024-02-03 LAB — RENAL FUNCTION PANEL
Albumin: 1.9 g/dL — ABNORMAL LOW (ref 3.5–5.0)
Anion gap: 9 (ref 5–15)
BUN: 12 mg/dL (ref 6–20)
CO2: 11 mmol/L — ABNORMAL LOW (ref 22–32)
Calcium: 8.1 mg/dL — ABNORMAL LOW (ref 8.9–10.3)
Chloride: 124 mmol/L — ABNORMAL HIGH (ref 98–111)
Creatinine, Ser: 1.38 mg/dL — ABNORMAL HIGH (ref 0.61–1.24)
GFR, Estimated: >60 mL/min (ref >60)
Glucose, Bld: 82 mg/dL (ref 70–99)
Phosphorus: 2.4 mg/dL — ABNORMAL LOW (ref 2.5–4.6)
Potassium: 3.8 mmol/L (ref 3.5–5.1)
Sodium: 144 mmol/L (ref 135–145)

## 2024-02-03 LAB — CBC
HCT: 38 % — ABNORMAL LOW (ref 39.0–52.0)
Hemoglobin: 11.7 g/dL — ABNORMAL LOW (ref 13.0–17.0)
MCH: 25.6 pg — ABNORMAL LOW (ref 26.0–34.0)
MCHC: 30.8 g/dL (ref 30.0–36.0)
MCV: 83.2 fL (ref 80.0–100.0)
Platelets: 540 10*3/uL — ABNORMAL HIGH (ref 150–400)
RBC: 4.57 MIL/uL (ref 4.22–5.81)
RDW: 18.9 % — ABNORMAL HIGH (ref 11.5–15.5)
WBC: 11.8 10*3/uL — ABNORMAL HIGH (ref 4.0–10.5)
nRBC: 0 % (ref 0.0–0.2)

## 2024-02-03 LAB — MAGNESIUM: Magnesium: 1.8 mg/dL (ref 1.7–2.4)

## 2024-02-03 MED ORDER — SODIUM CHLORIDE 0.9% FLUSH: 10.0000 mL | INTRAVENOUS | Status: DC | PRN

## 2024-02-03 MED ORDER — SODIUM BICARBONATE 650 MG PO TABS
1300.0000 mg | ORAL_TABLET | Freq: Three times a day (TID) | ORAL | Status: DC
  Administered 2024-02-03 – 2024-02-04 (×3): 1300 mg via ORAL
  Filled 2024-02-03 (×4): qty 2

## 2024-02-03 MED ORDER — CHLORHEXIDINE GLUCONATE CLOTH 2 % EX PADS
6.0000 | MEDICATED_PAD | Freq: Every day | CUTANEOUS | Status: DC
  Administered 2024-02-03 – 2024-02-14 (×11): 6 via TOPICAL

## 2024-02-03 NOTE — TOC CM/SW Note (Signed)
 Transition of Care [facility]) - Inpatient Brief Assessment   Patient Details  Name: [name] MRN: [id number] Date of Birth: 04/10/1966  Transition of Care [facility]) CM/SW Contact:    Tom-Johnson, Angelique Ken, RN Phone Number: 02/03/2024, 3:50 PM   Clinical Narrative:  Patient presented to the ED with increased Generalized Weakness,  Lethargy and worsening Sacral Wound with concern for Osteomyelitis. On IV abx. Patient received 2U PRBC for drop in hgb. Has Hx of Spina Bifida, Neurogenic Bladder with Urostomy, Colostomy, Chronic Sacral Decubitus, HTN and Anemia. Patient recently admitted with Septic Shock 12/23/23-12/30/23.   From home with sister, Carmelina Chinchilla whom is patient's primary caregiver and transports patient to and from his appointment. CM spoke with Carmelina Chinchilla [phone]). Carmelina Chinchilla states she takes care of patient's wound at home and purchases wound care supplies privately. States she receives patient's Ostomy supplies from [facility] [employer] through [employer].  PCP is [place], Rosalynn Come, MD and uses [facility] on [address] in [place].   No TOC needs or recommendations noted at this time.  Patient not Medically ready for discharge.  CM will continue to follow as patient progresses with care towards discharge.               Transition of Care Asessment:

## 2024-02-03 NOTE — Evaluation (Signed)
 Occupational Therapy Evaluation Patient Details Name: [name] MRN: [id number] DOB: 24-Feb-1966 Today's Date: 02/03/2024   History of Present Illness   Pt is a 58 y/o M admitted on 01/31/24 after presenting with generalized weakness. Pt is being treated for sepsis 2/2 acute on chronic osteomyelitis of the ischial tuberosity/inferior pubic ramus, L hip septic joint, spina bifida with sacral decubitus ulcer stage IV. PMH: spina bifida/neurogenic bladder with urostomy & colostomy, chronic sacral decubitus, chronic osteomyelitis of ischial tuberosity, inferior pubic ramus, HTN, anemia, recent hospitalization 3/21-3/28 for septic shock in the setting of necrotizing soft tissue infection & sacral decubitus     Clinical Impressions Patient is presenting at baseline with his adls which is total to Max A.  He is very HOH and difficult to get information from, but family confirmed the level of assist he was receiving at home.  No further OT is recommended at this time.      If plan is discharge home, recommend the following:   A lot of help with bathing/dressing/bathroom;Assistance with cooking/housework;Direct supervision/assist for medications management;Direct supervision/assist for financial management;Assist for transportation     Functional Status Assessment   Patient has not had a recent decline in their functional status     Equipment Recommendations   None recommended by OT     Recommendations for Other Services         Precautions/Restrictions   Precautions Precautions: Fall Restrictions Weight Bearing Restrictions Per Provider Order: No     Mobility Bed Mobility Overal bed mobility: Needs Assistance Bed Mobility: Rolling Rolling: Min assist, Used rails              Transfers                          Balance                                           ADL either performed or assessed with clinical judgement    ADL Overall ADL's : At baseline                                       General ADL Comments: Patient requires total a with bathing and dressing.  Patient stated he did not feel anything was different Data processing manager with PT who called the sister and confirmed he is total a with adls)     Vision         Perception         Praxis         Pertinent Vitals/Pain Pain Assessment Pain Assessment: No/denies pain     Extremity/Trunk Assessment Upper Extremity Assessment Upper Extremity Assessment: Generalized weakness   Lower Extremity Assessment Lower Extremity Assessment: Defer to PT evaluation   Cervical / Trunk Assessment Cervical / Trunk Assessment:  (hx of spina bifida)   Communication Communication Communication: Impaired Factors Affecting Communication: Hearing impaired   Cognition Arousal: Alert Behavior During Therapy: WFL for tasks assessed/performed                                 Following commands: Impaired Following commands impaired: Follows one step commands with increased time     Cueing  General  Comments   Cueing Techniques: Verbal cues;Gestural cues;Visual cues  No family present; patient is very Architectural technologist Instructions      Home Living Family/patient expects to be discharged to:: Private residence Living Arrangements: Other relatives Available Help at Discharge: Family Type of Home: House Home Access: Level entry     Home Layout: One level     Bathroom Shower/Tub: Sponge bathes at baseline         Home Equipment: Hospital bed;Other (comment);Wheelchair - manual          Prior Functioning/Environment Prior Level of Function : Needs assist       Physical Assist : ADLs (physical)   ADLs (physical): Bathing;Dressing;Toileting;IADLs Mobility Comments: Reports pt's sister & her significant other pick him up (1 person picks up arms & other person picks up legs) & assists pt OOB to w/c  this way. Pt reports he propels w/c with BUE. Sister reports pt used to be able to perform w/c pushups but unable to the past few weeks. Has difficulty getting out of hospital bed 2/2 air mattress. ADLs Comments: Pt reports he's able to cook microwavable meals from w/c level. Sister provides total assist for bathing at bed level.    OT Problem List:     OT Treatment/Interventions:        OT Goals(Current goals can be found in the care plan section)       OT Frequency:       Co-evaluation              AM-PAC OT "6 Clicks" Daily Activity     Outcome Measure Help from another person eating meals?: A Little Help from another person taking care of personal grooming?: A Little Help from another person toileting, which includes using toliet, bedpan, or urinal?: Total Help from another person bathing (including washing, rinsing, drying)?: Total Help from another person to put on and taking off regular upper body clothing?: Total Help from another person to put on and taking off regular lower body clothing?: Total 6 Click Score: 10   End of Session Nurse Communication: Mobility status  Activity Tolerance: Patient tolerated treatment well Patient left: in bed;with call bell/phone within reach;with bed alarm set  OT Visit Diagnosis: Muscle weakness (generalized) (M62.81)                Time: 2130-8657 OT Time Calculation (min): 11 min Charges:  OT General Charges $OT Visit: 1 Visit OT Evaluation $OT Eval Low Complexity: 1 Low  Chantal Comment OTR/L   Clementina Cutter 02/03/2024, 1:23 PM

## 2024-02-03 NOTE — Progress Notes (Signed)
 ID PROGRESS NOTE  58yo M with spina bifida with chronic pressure wounds and pelvic osteomyelitis.    Plan for Cefepime  2 gm IV q 12 hours + Linezolid  600 mg PO Q 12 hours + Metronidazole  500 mg PO Q 12 hours. through 02/16/24. Then can transition to 4 wk of amox/clav starting 02/17/24.  Continue with wound care recommendations and off loading. Needs to continue with improved nutrition. Unlikely to heal if not aggressive with wound care and off loading.   Will sign off.  Gerold Kos Levern Reader MD MPH [facility] [phone]

## 2024-02-03 NOTE — Progress Notes (Signed)
 PROGRESS NOTE  [name] WUJ:[id number] DOB: 1966/09/25   PCP: [name], MD  Patient is from: Home.  Lives with sister.  Wheelchair-bound at baseline.  DOA: 01/31/2024 LOS: 2  Chief complaints Chief Complaint  Patient presents with   Fatigue   Weakness   Wound Check     Brief Narrative / Interim history: 58 year old M with PMH of spina bifid/neurogenic bladder with urostomy and colostomy, chronic sacral decubitus, chronic osteomyelitis of ischial tuberosity, inferior pubic ramus, HTN, anemia and recent hospitalization from 3/21-3/28 for septic shock in the setting of necrotizing soft tissue infection and sacral decubitus for which she was treated with vasopressor, broad-spectrum antibiotics and discharged on p.o. Augmentin  for 14 weeks per recommendation by ID.    Patient presents with generalized weakness.  Workup in ED concerning for acute on chronic osteomyelitis, acute on chronic anemia, AKI and hypokalemia.  He had mild leukocytosis to 14.4.  Hgb dropped from 8.3 (baseline) to 5.9 and 5.5 on repeat.  Cr 1.98 (was 1.0 on 3/27).  K2.2.  CT abdomen and pelvis concerning bilateral deep gluteal ulcers with acute on chronic pelvic osteomyelitis and progressive left hip septic joint.  Blood cultures obtained.  Started on broad-spectrum antibiotics and potassium supplementation.  2 units of blood ordered.  Orthopedic surgery consulted  The next day, transfused 2 units and hemoglobin improved to 11.5.  Evaluated by orthopedic surgery who did not feel there is need for surgical intervention.  ID recommended cefepime , oral Zyvox  and Flagyl  until 5/16, and SNF hospital stay for wound care.  TOC consulted for placement.  Medically stable for discharge.  Subjective: Seen and examined earlier this morning.  No major events overnight of this morning.  No complaints.  Just had PICC line.  Objective: Vitals:   02/02/24 1201 02/02/24 2010 02/03/24 0545 02/03/24 0756  BP:  96/66 109/68 95/66 100/64  Pulse: 68 91 90 87  Resp:  18 17 18   Temp:  100.1 F (37.8 C) 98.1 F (36.7 C)   TempSrc:  Oral Oral   SpO2:  100% 100% 99%  Weight:      Height:        Examination:  GENERAL: No apparent distress.  Nontoxic. HEENT: MMM.  Vision and hearing grossly intact.  NECK: Supple.  No apparent JVD.  RESP:  No IWOB.  Fair aeration bilaterally. CVS:  RRR. Heart sounds normal.  ABD/GI/GU: BS+. Abd soft.  RLQ urostomy.  LLQ colostomy. MSK/EXT: BLE deformity and atrophy. SKIN: Unstageable sacral decubitus.  See picture under media. NEURO: Awake and alert.  Oriented to self, place, month and year.  No apparent focal neuro deficit. PSYCH: Calm. Normal affect.   Consultants:  Orthopedic surgery Infectious disease  Procedures: None  Microbiology summarized: Blood cultures NGTD  Assessment and plan: Sepsis secondary to acute on chronic osteomyelitis of the ischial tuberosity/inferior pubic ramus Left hip septic joint Spina bifida with sacral decubitus ulcer stage IV -Recently hospitalized for septic shock and discharged on Augmentin  for 14 weeks -CT showed bilateral deep gluteal ulcers with acute on chronic pelvic osteo and progressive left hip septic joint.  . -CRP 25.  ESR 140.  Blood cultures NGTD -Per Ortho, no good surgical remedy -IR consulted for drainage of left hip but recommended treating based on positive culture on 3/21 -ID recommended cefepime , oral Zyvox  and Flagyl  until 5/16, and SNF hospital stay for wound care. -PICC line placed on 5/2. -Wound care - RD consulted for optimize nutrition  Acute on chronic  anemia: Hgb dropped from 8.3-5.5.  Likely dilutional from IV fluid resuscitation.  No overt bleeding.  Improved to 11.6 after 2 units. Recent Labs    12/26/23 0239 12/27/23 1107 12/28/23 0122 12/28/23 0505 02/01/24 0010 02/01/24 0405 02/01/24 0439 02/01/24 1942 02/02/24 0535 02/03/24 0610  HGB 8.5* 8.7* 8.6* 8.4* 8.3* 5.9* 5.5*  11.6* 11.8* 11.7*  -Continue monitoring  AKI: B/l Cr ~1.0.  Likely hypovolemic.  Not on nephrotoxic meds.  Fair amount of urine in urostomy bag. Recent Labs    12/25/23 0330 12/26/23 0239 12/27/23 1107 12/28/23 0103 12/29/23 0400 02/01/24 0010 02/01/24 0405 02/01/24 1942 02/02/24 0552 02/03/24 0610  BUN 15 16 13 14 11  32* 27* 22* 17 12  CREATININE 1.08 1.59* 1.38* 1.21 1.07 1.98* 1.78* 1.52* 1.39* 1.38*  - Avoid nephrotoxic meds. - Continue midodrine  for blood pressure support - Closely monitor urine output   Chronic hypotension: On midodrine  at home.  Cortisol within normal.  TSH slightly low but free T4 normal.  Normal TTE in 2017. -Increased midodrine  from 10 to 15 mg 3 times daily on 4/30 -Discontinued Flomax .   Prolonged QTc: QTc 599 in the setting of severe hypokalemia.  Improved to 470. - Avoid or minimize QT prolonging drugs - Continue telemetry - Optimize electrolytes  Hypokalemia: Improved. - Monitor replenish as appropriate   Hyponatremia: In the setting of renal failure, hypotension.  Resolved. -Continue monitoring   History of spina bifida with a stage IV ulcer, neurogenic bladder, urostomy and colostomy -Continue wound and ostomy care -Discontinue Flomax .  Does not help with neurogenic bladder   Non-anion gap metabolic acidosis: Likely due to renal failure.  -Manage renal failure as above -Start sodium bicarbonate .  Nutrition Body mass index is 24.56 kg/m. - Dietitian consulted.        Unstageable pressure skin injury of bilateral buttocks: Present on arrival Pressure Injury 12/24/23 Buttocks Right (Active)  12/24/23 0100  Location: Buttocks  Location Orientation: Right  Staging:   Wound Description (Comments):   Present on Admission: Yes     Pressure Injury 12/24/23 Buttocks Left (Active)  12/24/23 0100  Location: Buttocks  Location Orientation: Left  Staging:   Wound Description (Comments):   Present on Admission: Yes   DVT  prophylaxis:  SCDs Start: 02/01/24 0204 Place TED hose Start: 02/01/24 0204  Code Status: Full code Family Communication: None at bedside today.  Updated patient's sister over the phone on 5/1. Level of care: Telemetry Medical Status is: Inpatient Remains inpatient appropriate because: Acute on chronic osteomyelitis, anemia, AKI and hypokalemia   Final disposition: SNF   55 minutes with more than 50% spent in reviewing records, counseling patient/family and coordinating care.   Sch Meds:  Scheduled Meds:  Chlorhexidine  Gluconate Cloth  6 each Topical Daily   linezolid   600 mg Oral Q12H   metroNIDAZOLE   500 mg Oral Q12H   midodrine   15 mg Oral TID WC   pantoprazole   40 mg Oral Daily   QUEtiapine   25 mg Oral QHS   rosuvastatin   10 mg Oral QHS   sodium chloride  flush  3 mL Intravenous Q12H   sucralfate   1 g Oral TID   Continuous Infusions:  ceFEPime  (MAXIPIME ) IV 2 g (02/03/24 1154)   PRN Meds:.acetaminophen  **OR** acetaminophen , prochlorperazine , sodium chloride  flush, sodium chloride  flush  Antimicrobials: Anti-infectives (From admission, onward)    Start     Dose/Rate Route Frequency Ordered Stop   02/02/24 2200  ceFEPIme  (MAXIPIME ) 2 g in sodium chloride  0.9 %  100 mL IVPB        2 g 200 mL/hr over 30 Minutes Intravenous Every 12 hours 02/02/24 1600 02/17/24 0959   02/02/24 2200  metroNIDAZOLE  (FLAGYL ) tablet 500 mg        500 mg Oral Every 12 hours 02/02/24 1600 02/17/24 0959   02/02/24 2200  linezolid  (ZYVOX ) tablet 600 mg        600 mg Oral Every 12 hours 02/02/24 1600 02/17/24 0959   02/01/24 1815  DAPTOmycin  (CUBICIN ) 350 mg in sodium chloride  0.9 % IVPB  Status:  Discontinued        8 mg/kg  46 kg 114 mL/hr over 30 Minutes Intravenous Daily 02/01/24 1719 02/02/24 1600   02/01/24 0700  piperacillin -tazobactam (ZOSYN ) IVPB 3.375 g  Status:  Discontinued        3.375 g 12.5 mL/hr over 240 Minutes Intravenous Every 8 hours 02/01/24 0254 02/02/24 1600   02/01/24  0348  vancomycin  variable dose per unstable renal function (pharmacist dosing)  Status:  Discontinued         Does not apply See admin instructions 02/01/24 0348 02/01/24 1657   02/01/24 0015  vancomycin  (VANCOCIN ) IVPB 1000 mg/200 mL premix        1,000 mg 200 mL/hr over 60 Minutes Intravenous  Once 02/01/24 0010 02/01/24 0242   02/01/24 0015  piperacillin -tazobactam (ZOSYN ) IVPB 3.375 g        3.375 g 100 mL/hr over 30 Minutes Intravenous  Once 02/01/24 0010 02/01/24 0148        I have personally reviewed the following labs and images: CBC: Recent Labs  Lab 02/01/24 0010 02/01/24 0405 02/01/24 0439 02/01/24 1942 02/02/24 0535 02/03/24 0610  WBC 14.4* 10.0  --  8.0 8.4 11.8*  NEUTROABS 11.8*  --   --   --   --   --   HGB 8.3* 5.9* 5.5* 11.6* 11.8* 11.7*  HCT 28.3* 20.3* 19.1* 37.6* 37.1* 38.0*  MCV 77.3* 78.1*  --  82.8 81.2 83.2  PLT 566* 622*  --  379 512* 540*   BMP &GFR Recent Labs  Lab 02/01/24 0010 02/01/24 0403 02/01/24 0405 02/01/24 1942 02/02/24 0552 02/03/24 0610  NA 131*  --  133* 138 142 144  K 2.2*  --  2.7* 5.0 3.4* 3.8  CL 102  --  108 117* 117* 124*  CO2 15*  --  17* 11* 14* 11*  GLUCOSE 114*  --  118* 144* 95 82  BUN 32*  --  27* 22* 17 12  CREATININE 1.98*  --  1.78* 1.52* 1.39* 1.38*  CALCIUM  7.9*  --  7.3* 7.7* 8.0* 8.1*  MG  --  2.0  --   --   --  1.8  PHOS  --   --   --  2.1* 2.5 2.4*   Estimated Creatinine Clearance: 34 mL/min (A) (by C-G formula based on SCr of 1.38 mg/dL (H)). Liver & Pancreas: Recent Labs  Lab 02/01/24 0010 02/01/24 0405 02/01/24 1942 02/02/24 0552 02/03/24 0610  AST 27 22  --   --   --   ALT 18 12  --   --   --   ALKPHOS 127* 84  --   --   --   BILITOT 0.6 0.9  --   --   --   PROT 6.6 5.4*  --   --   --   ALBUMIN  <1.5* 1.7* 1.6* 2.2* 1.9*   No results for input(s): "LIPASE", "AMYLASE"  in the last 168 hours. No results for input(s): "AMMONIA" in the last 168 hours. Diabetic: No results for input(s):  "HGBA1C" in the last 72 hours. No results for input(s): "GLUCAP" in the last 168 hours. Cardiac Enzymes: Recent Labs  Lab 02/02/24 0552  CKTOTAL 27*   No results for input(s): "PROBNP" in the last 8760 hours. Coagulation Profile: No results for input(s): "INR", "PROTIME" in the last 168 hours. Thyroid  Function Tests: Recent Labs    02/01/24 0800 02/01/24 1942  TSH 0.305*  --   FREET4  --  0.88   Lipid Profile: No results for input(s): "CHOL", "HDL", "LDLCALC", "TRIG", "CHOLHDL", "LDLDIRECT" in the last 72 hours. Anemia Panel: No results for input(s): "VITAMINB12", "FOLATE", "FERRITIN", "TIBC", "IRON", "RETICCTPCT" in the last 72 hours. Urine analysis:    Component Value Date/Time   COLORURINE AMBER (A) 01/31/2024 0000   APPEARANCEUR CLOUDY (A) 01/31/2024 0000   APPEARANCEUR Cloudy (A) 03/21/2019 1130   LABSPEC 1.008 01/31/2024 0000   PHURINE 8.0 01/31/2024 0000   GLUCOSEU NEGATIVE 01/31/2024 0000   HGBUR SMALL (A) 01/31/2024 0000   BILIRUBINUR NEGATIVE 01/31/2024 0000   BILIRUBINUR Negative 03/21/2019 1130   KETONESUR NEGATIVE 01/31/2024 0000   PROTEINUR 100 (A) 01/31/2024 0000   NITRITE NEGATIVE 01/31/2024 0000   LEUKOCYTESUR LARGE (A) 01/31/2024 0000   Sepsis Labs: Invalid input(s): "PROCALCITONIN", "LACTICIDVEN"  Microbiology: Recent Results (from the past 240 hours)  Blood culture (routine x 2)     Status: None (Preliminary result)   Collection Time: 02/01/24 12:04 AM   Specimen: BLOOD  Result Value Ref Range Status   Specimen Description BLOOD SITE NOT SPECIFIED  Final   Special Requests   Final    BOTTLES DRAWN AEROBIC AND ANAEROBIC Blood Culture results may not be optimal due to an inadequate volume of blood received in culture bottles   Culture   Final    NO GROWTH 2 DAYS Performed at [facility], [address], [place], [place] [address]    Report Status PENDING  Incomplete  Culture, blood (Routine X 2) w Reflex to ID Panel     Status: None  (Preliminary result)   Collection Time: 02/01/24 12:36 AM   Specimen: BLOOD  Result Value Ref Range Status   Specimen Description BLOOD SITE NOT SPECIFIED  Final   Special Requests   Final    BOTTLES DRAWN AEROBIC AND ANAEROBIC Blood Culture results may not be optimal due to an inadequate volume of blood received in culture bottles   Culture   Final    NO GROWTH 2 DAYS Performed at [facility], [address], [place], [place] [address]    Report Status PENDING  Incomplete  MRSA Next Gen by PCR, Nasal     Status: None   Collection Time: 02/01/24  2:44 PM   Specimen: Nasal Mucosa; Nasal Swab  Result Value Ref Range Status   MRSA by PCR Next Gen NOT DETECTED NOT DETECTED Final    Comment: (NOTE) The GeneXpert MRSA Assay (FDA approved for NASAL specimens only), is one component of a comprehensive MRSA colonization surveillance program. It is not intended to diagnose MRSA infection nor to guide or monitor treatment for MRSA infections. Test performance is not FDA approved in patients less than 13 years old. Performed at [facility], [address], [place], [place] [address]     Radiology Studies: US  EKG SITE RITE Result Date: 02/03/2024 If Site Rite image not attached, placement could not  be confirmed due to current cardiac rhythm.      Shrinika Blatz T. Tida Saner [facility]  If 7PM-7AM, please contact night-coverage [email or web address] 02/03/2024, 3:23 PM

## 2024-02-03 NOTE — Progress Notes (Signed)
 Peripherally Inserted Central Catheter Placement  The IV Nurse, Curtis Dow, RN has discussed with the patient and/or persons authorized to consent for the patient, the purpose of this procedure and the potential benefits and risks involved with this procedure.  The benefits include less needle sticks, lab draws from the catheter, and the patient may be discharged home with the catheter. Risks include, but not limited to, infection, bleeding, blood clot (thrombus formation), and puncture of an artery; nerve damage and irregular heartbeat and possibility to perform a PICC exchange if needed/ordered by physician.  Alternatives to this procedure were also discussed.  Bard Power PICC patient education guide, fact sheet on infection prevention and patient information card has been provided to patient /or left at bedside.    PICC Placement Documentation  PICC Single Lumen 02/03/24 Right Brachial 37 cm 0 cm (Active)  Indication for Insertion or Continuance of Line Prolonged intravenous therapies 02/03/24 1057  Exposed Catheter (cm) 0 cm 02/03/24 1057  Site Assessment Clean, Dry, Intact 02/03/24 1057  Line Status Saline locked;Blood return noted 02/03/24 1057  Dressing Type Transparent;Securing device 02/03/24 1057  Dressing Status Antimicrobial disc/dressing in place;Clean, Dry, Intact 02/03/24 1057  Line Care Connections checked and tightened 02/03/24 1057  Line Adjustment (NICU/IV Team Only) No 02/03/24 1057  Dressing Intervention New dressing 02/03/24 1057  Dressing Change Due 02/10/24 02/03/24 1057       [name] 02/03/2024, 10:58 AM

## 2024-02-03 NOTE — Evaluation (Signed)
 Physical Therapy Evaluation Patient Details Name: [name] MRN: [id number] DOB: 10-04-1966 Today's Date: 02/03/2024  History of Present Illness  Pt is a 58 y/o M admitted on 01/31/24 after presenting with generalized weakness. Pt is being treated for sepsis 2/2 acute on chronic osteomyelitis of the ischial tuberosity/inferior pubic ramus, L hip septic joint, spina bifida with sacral decubitus ulcer stage IV. PMH: spina bifida/neurogenic bladder with urostomy & colostomy, chronic sacral decubitus, chronic osteomyelitis of ischial tuberosity, inferior pubic ramus, HTN, anemia, recent hospitalization 3/21-3/28 for septic shock in the setting of necrotizing soft tissue infection & sacral decubitus  Clinical Impression  Pt seen for PT evaluation with pt agreeable. Session limited by pt [facility] but reports he does not wear hearing aides. Contacted sister to confirm PLOF. Pt reports he lives with his sister & her significant other in a 1 level home with level entry. Pt reports he requires 2 person assist to transfer bed<>w/c but is able to propel w/c around the home & microwave meals on his own; sister provides total assist with bathing at bed level. On this date, pt presents with BUE & trunk/core weakness, requiring min assist to transition to long sitting, assistance with uncrossing BLE as pt unable to with BUE. Will keep pt on PT caseload for BUE/core/trunk strengthening & to educate pt on repositioning.         If plan is discharge home, recommend the following: Two people to help with walking and/or transfers;A lot of help with bathing/dressing/bathroom;Assist for transportation;Assistance with cooking/housework;Help with stairs or ramp for entrance   Can travel by private vehicle        Equipment Recommendations Hoyer lift  Recommendations for Other Services       Functional Status Assessment Patient has had a recent decline in their functional status and demonstrates the ability to  make significant improvements in function in a reasonable and predictable amount of time.     Precautions / Restrictions Precautions Precautions: Fall Restrictions Weight Bearing Restrictions Per Provider Order: No      Mobility  Bed Mobility Overal bed mobility: Needs Assistance             General bed mobility comments: Pt requires min assist to transition from semi fowler to long sitting with use of bed rails, HOB halfway elevated. Pt able to manage bed controls without assistance.    Transfers                        Ambulation/Gait                  Stairs            Wheelchair Mobility     Tilt Bed    Modified Rankin (Stroke Patients Only)       Balance                                             Pertinent Vitals/Pain Pain Assessment Pain Assessment: No/denies pain    Home Living Family/patient expects to be discharged to:: Private residence Living Arrangements: Other relatives (sister & sister's significant other) Available Help at Discharge: Family (sister works until [employer], sister's significant other may work during the day leaving pt home alone)   Home Access: Level entry       Home Layout: One level Home Equipment: Hospital bed;Other (  comment);Wheelchair - manual (air mattress)      Prior Function Prior Level of Function : Needs assist             Mobility Comments: Reports pt's sister & her significant other pick him up (1 person picks up arms & other person picks up legs) & assists pt OOB to w/c this way. Pt reports he propels w/c with BUE. Sister reports pt used to be able to perform w/c pushups but unable to the past few weeks. Has difficulty getting out of hospital bed 2/2 air mattress. ADLs Comments: Pt reports he's able to cook microwavable meals from w/c level. Sister provides total assist for bathing at bed level.     Extremity/Trunk Assessment   Upper Extremity Assessment Upper  Extremity Assessment: Generalized weakness    Lower Extremity Assessment Lower Extremity Assessment:  (no active movement in BLE, pt with hx of spina bifida. Requires assistance to uncross BLE (too weak when attempting with BUE).)    Cervical / Trunk Assessment Cervical / Trunk Assessment:  (hx of spina bifida)  Communication   Communication Communication: Impaired Factors Affecting Communication: Hearing impaired    Cognition Arousal: Alert Behavior During Therapy: WFL for tasks assessed/performed   PT - Cognitive impairments: Difficult to assess Difficult to assess due to: Hard of hearing/deaf                       Following commands: Impaired Following commands impaired: Follows one step commands with increased time     Cueing Cueing Techniques: Verbal cues, Visual cues, Tactile cues, Gestural cues     General Comments      Exercises     Assessment/Plan    PT Assessment Patient needs continued PT services  PT Problem List Decreased strength;Decreased activity tolerance;Decreased balance;Decreased mobility;Decreased skin integrity;Decreased range of motion       PT Treatment Interventions DME instruction;Balance training;Modalities;Neuromuscular re-education;Functional mobility training;Wheelchair mobility training;Patient/family education;Therapeutic exercise;Therapeutic activities    PT Goals (Current goals can be found in the Care Plan section)  Acute Rehab PT Goals Patient Stated Goal: get strength back PT Goal Formulation: With patient/family Time For Goal Achievement: 02/17/24 Potential to Achieve Goals: Fair Additional Goals Additional Goal #1: Pt will verbalize & demonstrate ability to reposition self in bed every ~2 hours to for pressure relief.    Frequency Min 1X/week     Co-evaluation               AM-PAC PT "6 Clicks" Mobility  Outcome Measure Help needed turning from your back to your side while in a flat bed without using  bedrails?: A Lot Help needed moving from lying on your back to sitting on the side of a flat bed without using bedrails?: Total Help needed moving to and from a bed to a chair (including a wheelchair)?: Total Help needed standing up from a chair using your arms (e.g., wheelchair or bedside chair)?: Total Help needed to walk in hospital room?: Total Help needed climbing 3-5 steps with a railing? : Total 6 Click Score: 7    End of Session   Activity Tolerance: Patient tolerated treatment well Patient left: in bed;with call bell/phone within reach;with bed alarm set Nurse Communication: Mobility status PT Visit Diagnosis: Muscle weakness (generalized) (M62.81);Other abnormalities of gait and mobility (R26.89);Other symptoms and signs involving the nervous system (R29.898)    Time: 1610-9604 PT Time Calculation (min) (ACUTE ONLY): 14 min   Charges:   PT Evaluation $PT  Eval High Complexity: 1 High   PT General Charges $$ ACUTE PT VISIT: 1 Visit         Emaline Handsome, PT, DPT 02/03/24, 10:34 AM   Venetta Gill 02/03/2024, 10:32 AM

## 2024-02-04 ENCOUNTER — Inpatient Hospital Stay (HOSPITAL_COMMUNITY)

## 2024-02-04 DIAGNOSIS — N179 Acute kidney failure, unspecified: Secondary | ICD-10-CM | POA: Diagnosis not present

## 2024-02-04 DIAGNOSIS — M4628 Osteomyelitis of vertebra, sacral and sacrococcygeal region: Secondary | ICD-10-CM | POA: Diagnosis not present

## 2024-02-04 DIAGNOSIS — E872 Acidosis, unspecified: Secondary | ICD-10-CM | POA: Diagnosis not present

## 2024-02-04 DIAGNOSIS — D649 Anemia, unspecified: Secondary | ICD-10-CM | POA: Diagnosis not present

## 2024-02-04 LAB — TYPE AND SCREEN
ABO/RH(D): A POS
Antibody Screen: NEGATIVE
Unit division: 0
Unit division: 0
Unit division: 0

## 2024-02-04 LAB — BPAM RBC
Blood Product Expiration Date: 202505202359
Blood Product Expiration Date: 202505202359
Blood Product Expiration Date: 202505242359
ISSUE DATE / TIME: 202504300626
ISSUE DATE / TIME: 202504301115
Unit Type and Rh: 6200
Unit Type and Rh: 6200
Unit Type and Rh: 6200

## 2024-02-04 LAB — RENAL FUNCTION PANEL
Albumin: 1.6 g/dL — ABNORMAL LOW (ref 3.5–5.0)
Anion gap: 12 (ref 5–15)
BUN: 14 mg/dL (ref 6–20)
CO2: 15 mmol/L — ABNORMAL LOW (ref 22–32)
Calcium: 8.2 mg/dL — ABNORMAL LOW (ref 8.9–10.3)
Chloride: 119 mmol/L — ABNORMAL HIGH (ref 98–111)
Creatinine, Ser: 1.4 mg/dL — ABNORMAL HIGH (ref 0.61–1.24)
GFR, Estimated: 59 mL/min — ABNORMAL LOW (ref >60)
Glucose, Bld: 76 mg/dL (ref 70–99)
Phosphorus: 2.1 mg/dL — ABNORMAL LOW (ref 2.5–4.6)
Potassium: 2.8 mmol/L — ABNORMAL LOW (ref 3.5–5.1)
Sodium: 146 mmol/L — ABNORMAL HIGH (ref 135–145)

## 2024-02-04 LAB — CBC
HCT: 37.5 % — ABNORMAL LOW (ref 39.0–52.0)
Hemoglobin: 11.5 g/dL — ABNORMAL LOW (ref 13.0–17.0)
MCH: 25.7 pg — ABNORMAL LOW (ref 26.0–34.0)
MCHC: 30.7 g/dL (ref 30.0–36.0)
MCV: 83.7 fL (ref 80.0–100.0)
Platelets: 513 10*3/uL — ABNORMAL HIGH (ref 150–400)
RBC: 4.48 MIL/uL (ref 4.22–5.81)
RDW: 19.2 % — ABNORMAL HIGH (ref 11.5–15.5)
WBC: 11 10*3/uL — ABNORMAL HIGH (ref 4.0–10.5)
nRBC: 0 % (ref 0.0–0.2)

## 2024-02-04 LAB — MAGNESIUM: Magnesium: 1.6 mg/dL — ABNORMAL LOW (ref 1.7–2.4)

## 2024-02-04 MED ORDER — CARBAMIDE PEROXIDE 6.5 % OT SOLN
5.0000 [drp] | Freq: Three times a day (TID) | OTIC | Status: AC
  Administered 2024-02-04 – 2024-02-08 (×11): 5 [drp] via OTIC
  Filled 2024-02-04 (×2): qty 15

## 2024-02-04 MED ORDER — POTASSIUM CHLORIDE 20 MEQ PO PACK
40.0000 meq | PACK | ORAL | Status: AC
  Administered 2024-02-04: 40 meq via ORAL
  Filled 2024-02-04: qty 2

## 2024-02-04 MED ORDER — POTASSIUM CHLORIDE IN NACL 20-0.45 MEQ/L-% IV SOLN
INTRAVENOUS | Status: DC
  Filled 2024-02-04 (×2): qty 1000

## 2024-02-04 MED ORDER — LINEZOLID 600 MG/300ML IV SOLN
600.0000 mg | Freq: Once | INTRAVENOUS | Status: AC
  Administered 2024-02-05: 600 mg via INTRAVENOUS
  Filled 2024-02-04: qty 300

## 2024-02-04 MED ORDER — SODIUM CHLORIDE 0.9 % IV SOLN
1.0000 g | Freq: Every day | INTRAVENOUS | Status: DC
  Administered 2024-02-04 – 2024-02-10 (×7): 1 g via INTRAVENOUS
  Filled 2024-02-04 (×9): qty 1000

## 2024-02-04 MED ORDER — MAGNESIUM SULFATE 2 GM/50ML IV SOLN
2.0000 g | Freq: Once | INTRAVENOUS | Status: AC
  Administered 2024-02-04: 2 g via INTRAVENOUS
  Filled 2024-02-04: qty 50

## 2024-02-04 NOTE — TOC CM/SW Note (Signed)
 Transition of Care [facility]) - Inpatient Brief Assessment   Patient Details  Name: [name] MRN: [id number] Date of Birth: 11/13/1965  [facility] CM/SW Contact:    Jannice Mends, LCSW Phone Number: 02/04/2024, 11:06 AM   Clinical Narrative: CSW spoke with patient's sister to discuss discharge plan. She confirmed that she is comfortable taking care of patient at home with his wounds and IV antibiotics. She declined SNF placement. She is agreeable for Home Health to be set up as patient does not have any currently and will be able to learn IV abx teaching. TOC to continue to follow.   Transition of Care Asessment: Insurance and Status: Insurance coverage has been reviewed Patient has primary care physician: Yes Home environment has been reviewed: From home Prior level of function:: wc at baseline Prior/Current Home Services: No current home services Social Drivers of Health Review: SDOH reviewed no interventions necessary Readmission risk has been reviewed: Yes Transition of care needs: transition of care needs identified, TOC will continue to follow

## 2024-02-04 NOTE — Progress Notes (Signed)
   02/03/24 2352  Provider Notification  Provider Name/Title Dr. Gladstone Lamer  Date Provider Notified 02/03/24  Time Provider Notified 2352  Method of Notification Page  Notification Reason Change in status  Provider response No new orders   Notified by telemetry that patient had a 26 beat run of Atrial Tachycardia with HR up to 172 BPM.  Upon assessment, patient is sleeping.  VS are stable.  He is now NSR on telemetry with HR in the 70s.  Dr. Gladstone Lamer made aware.  No new orders at this time.  Will continue to monitor patient.  Necia Bali RN

## 2024-02-04 NOTE — Progress Notes (Signed)
 PROGRESS NOTE  [name] WUJ:[id number] DOB: 1966/06/10   PCP: [name], MD  Patient is from: Home.  Lives with sister.  Wheelchair-bound at baseline.  DOA: 01/31/2024 LOS: 3  Chief complaints Chief Complaint  Patient presents with   Fatigue   Weakness   Wound Check     Brief Narrative / Interim history: 58 year old M with PMH of spina bifid/neurogenic bladder with urostomy and colostomy, chronic sacral decubitus, chronic osteomyelitis of ischial tuberosity, inferior pubic ramus, HTN, anemia and recent hospitalization from 3/21-3/28 for septic shock in the setting of necrotizing soft tissue infection and sacral decubitus for which she was treated with vasopressor, broad-spectrum antibiotics and discharged on p.o. Augmentin  for 14 weeks per recommendation by ID.    Patient presents with generalized weakness.  Workup in ED concerning for acute on chronic osteomyelitis, acute on chronic anemia, AKI and hypokalemia.  He had mild leukocytosis to 14.4.  Hgb dropped from 8.3 (baseline) to 5.9 and 5.5 on repeat.  Cr 1.98 (was 1.0 on 3/27).  K2.2.  CT abdomen and pelvis concerning bilateral deep gluteal ulcers with acute on chronic pelvic osteomyelitis and progressive left hip septic joint.  Blood cultures obtained.  Started on broad-spectrum antibiotics and potassium supplementation.  2 units of blood ordered.  Orthopedic surgery consulted  The next day, transfused 2 units and hemoglobin improved to 11.5.  Evaluated by orthopedic surgery who did not feel there is need for surgical intervention.  ID recommended cefepime , oral Zyvox  and Flagyl  until 5/16, and SNF hospital stay for wound care.  TOC consulted for placement.  Medically stable for discharge.  Subjective: Seen and examined earlier this morning.  No major events overnight or this morning.  Awake but not interacting.  Seems to have diminished hearing.   Objective: Vitals:   02/03/24 1957 02/03/24 2338 02/04/24  0418 02/04/24 0744  BP: 121/75 106/75 116/72 102/66  Pulse: 73 81 91 86  Resp: 18 18 16 18   Temp: 98.2 F (36.8 C) 98.5 F (36.9 C) 97.6 F (36.4 C)   TempSrc: Oral Oral Oral   SpO2: 100% 99% 98% 100%  Weight:      Height:        Examination:  GENERAL: No apparent distress.  Nontoxic. HEENT: MMM.  Right eye appears normal.  Corneal cloudiness left eye (chronic).  Hard of hearing.  Earwax in right ear.  Left ear clear. NECK: Supple.  No apparent JVD.  RESP:  No IWOB.  Fair aeration bilaterally. CVS:  RRR. Heart sounds normal.  ABD/GI/GU: BS+. Abd soft.  RLQ urostomy.  LLQ colostomy. MSK/EXT: BLE deformity and atrophy. SKIN: Unstageable sacral decubitus.  See picture under media. NEURO: Awake and alert.  Oriented to self, place, month and year.  No apparent focal neuro deficit. PSYCH: Calm. Normal affect.   Consultants:  Orthopedic surgery Infectious disease  Procedures: None  Microbiology summarized: Blood cultures NGTD  Assessment and plan: Sepsis secondary to acute on chronic osteomyelitis of the ischial tuberosity/inferior pubic ramus Left hip septic joint Spina bifida with sacral decubitus ulcer stage IV -Recently hospitalized for septic shock and discharged on Augmentin  for 14 weeks -CT-bilateral deep gluteal ulcers with acute on chronic pelvic osteo and progressive left hip septic joint.  . -CRP 25.  ESR 140.  Blood cultures NGTD -Per Ortho, no good surgical remedy -IR consulted for drainage of left hip but recommended treating based on positive culture on 3/21 -ID recommended cefepime , oral Zyvox  and Flagyl  until 5/16, and SNF  hospital stay for wound care. -Now with some encephalopathy.  Will discuss with ID about alternative for IV cefepime  -PICC line placed on 5/2. -Wound care -RD consulted for optimize nutrition  Acute on chronic anemia: Hgb dropped from 8.3-5.5.  Likely dilutional from IV fluid resuscitation.  No overt bleeding.  Improved to 11.6 after 2  units. Recent Labs    12/27/23 1107 12/28/23 0122 12/28/23 0505 02/01/24 0010 02/01/24 0405 02/01/24 0439 02/01/24 1942 02/02/24 0535 02/03/24 0610 02/04/24 0925  HGB 8.7* 8.6* 8.4* 8.3* 5.9* 5.5* 11.6* 11.8* 11.7* 11.5*  -Continue monitoring  AKI: B/l Cr ~1.0.  Likely hypovolemic.  Not on nephrotoxic meds.  Fair amount of urine in urostomy bag. Recent Labs    12/26/23 0239 12/27/23 1107 12/28/23 0103 12/29/23 0400 02/01/24 0010 02/01/24 0405 02/01/24 1942 02/02/24 0552 02/03/24 0610 02/04/24 0925  BUN 16 13 14 11  32* 27* 22* 17 12 14   CREATININE 1.59* 1.38* 1.21 1.07 1.98* 1.78* 1.52* 1.39* 1.38* 1.40*  -Avoid nephrotoxic meds. -Continue midodrine  for blood pressure support -Closely monitor urine output -Resume IV fluid for hypernatremia   Chronic hypotension: On midodrine  at home.  Cortisol within normal.  TSH slightly low but free T4 normal.  Normal TTE in 2017. -Increased midodrine  from 10 to 15 mg 3 times daily on 4/30 -Discontinued Flomax .  Acute encephalopathy: Patient is awake but not interacting.  Has significantly diminished hearing as well.  Has been on cefepime  for osteomyelitis.  Not on sedating medication other than low-dose Seroquel .  TSH was normal.  Low suspicion for hypercarbia. -CT head, ammonia, B12 -Will discuss with ID about antibiotics  Diminished hearing/earwax in right ear -Debrox   Prolonged QTc: QTc 599 in the setting of severe hypokalemia.  Improved to 470. - Avoid or minimize QT prolonging drugs - Continue telemetry - Optimize electrolytes  Hypokalemia/hypomagnesemia/hypophosphatemia:  - Monitor replenish as appropriate   Hyponatremia: Resolved. -Continue monitoring  Hypernatremia: Likely due to poor p.o. intake/dehydration. -Start half-normal saline infusion -Recheck in the morning   History of spina bifida with a stage IV ulcer, neurogenic bladder, urostomy and colostomy -Continue wound and ostomy care -Discontinue Flomax .   Does not help with neurogenic bladder   Non-anion gap metabolic acidosis: Likely due to renal failure.  -Manage renal failure as above -Start sodium bicarbonate .  Nutrition Body mass index is 24.56 kg/m. -Dietitian consulted.        Unstageable pressure skin injury of bilateral buttocks: Present on arrival Pressure Injury 12/24/23 Buttocks Right (Active)  12/24/23 0100  Location: Buttocks  Location Orientation: Right  Staging:   Wound Description (Comments):   Present on Admission: Yes     Pressure Injury 12/24/23 Buttocks Left (Active)  12/24/23 0100  Location: Buttocks  Location Orientation: Left  Staging:   Wound Description (Comments):   Present on Admission: Yes   DVT prophylaxis:  SCDs Start: 02/01/24 0204 Place TED hose Start: 02/01/24 0204  Code Status: Full code Family Communication: None at bedside today.  Level of care: Telemetry Medical Status is: Inpatient Remains inpatient appropriate because: Acute on chronic osteomyelitis, anemia, AKI and electrolyte derangements   Final disposition: SNF   55 minutes with more than 50% spent in reviewing records, counseling patient/family and coordinating care.   Sch Meds:  Scheduled Meds:  Chlorhexidine  Gluconate Cloth  6 each Topical Daily   linezolid   600 mg Oral Q12H   metroNIDAZOLE   500 mg Oral Q12H   midodrine   15 mg Oral TID WC   pantoprazole   40  mg Oral Daily   potassium chloride   40 mEq Oral Q4H   QUEtiapine   25 mg Oral QHS   rosuvastatin   10 mg Oral QHS   sodium bicarbonate   1,300 mg Oral TID   sodium chloride  flush  3 mL Intravenous Q12H   sucralfate   1 g Oral TID   Continuous Infusions:  0.45 % NaCl with KCl 20 mEq / L     ceFEPime  (MAXIPIME ) IV 2 g (02/04/24 0816)   PRN Meds:.acetaminophen  **OR** acetaminophen , prochlorperazine , sodium chloride  flush, sodium chloride  flush  Antimicrobials: Anti-infectives (From admission, onward)    Start     Dose/Rate Route Frequency Ordered Stop    02/02/24 2200  ceFEPIme  (MAXIPIME ) 2 g in sodium chloride  0.9 % 100 mL IVPB        2 g 200 mL/hr over 30 Minutes Intravenous Every 12 hours 02/02/24 1600 02/17/24 0959   02/02/24 2200  metroNIDAZOLE  (FLAGYL ) tablet 500 mg        500 mg Oral Every 12 hours 02/02/24 1600 02/17/24 0959   02/02/24 2200  linezolid  (ZYVOX ) tablet 600 mg        600 mg Oral Every 12 hours 02/02/24 1600 02/17/24 0959   02/01/24 1815  DAPTOmycin  (CUBICIN ) 350 mg in sodium chloride  0.9 % IVPB  Status:  Discontinued        8 mg/kg  46 kg 114 mL/hr over 30 Minutes Intravenous Daily 02/01/24 1719 02/02/24 1600   02/01/24 0700  piperacillin -tazobactam (ZOSYN ) IVPB 3.375 g  Status:  Discontinued        3.375 g 12.5 mL/hr over 240 Minutes Intravenous Every 8 hours 02/01/24 0254 02/02/24 1600   02/01/24 0348  vancomycin  variable dose per unstable renal function (pharmacist dosing)  Status:  Discontinued         Does not apply See admin instructions 02/01/24 0348 02/01/24 1657   02/01/24 0015  vancomycin  (VANCOCIN ) IVPB 1000 mg/200 mL premix        1,000 mg 200 mL/hr over 60 Minutes Intravenous  Once 02/01/24 0010 02/01/24 0242   02/01/24 0015  piperacillin -tazobactam (ZOSYN ) IVPB 3.375 g        3.375 g 100 mL/hr over 30 Minutes Intravenous  Once 02/01/24 0010 02/01/24 0148        I have personally reviewed the following labs and images: CBC: Recent Labs  Lab 02/01/24 0010 02/01/24 0405 02/01/24 0439 02/01/24 1942 02/02/24 0535 02/03/24 0610 02/04/24 0925  WBC 14.4* 10.0  --  8.0 8.4 11.8* 11.0*  NEUTROABS 11.8*  --   --   --   --   --   --   HGB 8.3* 5.9* 5.5* 11.6* 11.8* 11.7* 11.5*  HCT 28.3* 20.3* 19.1* 37.6* 37.1* 38.0* 37.5*  MCV 77.3* 78.1*  --  82.8 81.2 83.2 83.7  PLT 566* 622*  --  379 512* 540* 513*   BMP &GFR Recent Labs  Lab 02/01/24 0403 02/01/24 0405 02/01/24 1942 02/02/24 0552 02/03/24 0610 02/04/24 0925  NA  --  133* 138 142 144 146*  K  --  2.7* 5.0 3.4* 3.8 2.8*  CL  --  108  117* 117* 124* 119*  CO2  --  17* 11* 14* 11* 15*  GLUCOSE  --  118* 144* 95 82 76  BUN  --  27* 22* 17 12 14   CREATININE  --  1.78* 1.52* 1.39* 1.38* 1.40*  CALCIUM   --  7.3* 7.7* 8.0* 8.1* 8.2*  MG 2.0  --   --   --  1.8 1.6*  PHOS  --   --  2.1* 2.5 2.4* 2.1*   Estimated Creatinine Clearance: 33.5 mL/min (A) (by C-G formula based on SCr of 1.4 mg/dL (H)). Liver & Pancreas: Recent Labs  Lab 02/01/24 0010 02/01/24 0405 02/01/24 1942 02/02/24 0552 02/03/24 0610 02/04/24 0925  AST 27 22  --   --   --   --   ALT 18 12  --   --   --   --   ALKPHOS 127* 84  --   --   --   --   BILITOT 0.6 0.9  --   --   --   --   PROT 6.6 5.4*  --   --   --   --   ALBUMIN  <1.5* 1.7* 1.6* 2.2* 1.9* 1.6*   No results for input(s): "LIPASE", "AMYLASE" in the last 168 hours. No results for input(s): "AMMONIA" in the last 168 hours. Diabetic: No results for input(s): "HGBA1C" in the last 72 hours. No results for input(s): "GLUCAP" in the last 168 hours. Cardiac Enzymes: Recent Labs  Lab 02/02/24 0552  CKTOTAL 27*   No results for input(s): "PROBNP" in the last 8760 hours. Coagulation Profile: No results for input(s): "INR", "PROTIME" in the last 168 hours. Thyroid  Function Tests: Recent Labs    02/01/24 1942  FREET4 0.88   Lipid Profile: No results for input(s): "CHOL", "HDL", "LDLCALC", "TRIG", "CHOLHDL", "LDLDIRECT" in the last 72 hours. Anemia Panel: No results for input(s): "VITAMINB12", "FOLATE", "FERRITIN", "TIBC", "IRON", "RETICCTPCT" in the last 72 hours. Urine analysis:    Component Value Date/Time   COLORURINE AMBER (A) 01/31/2024 0000   APPEARANCEUR CLOUDY (A) 01/31/2024 0000   APPEARANCEUR Cloudy (A) 03/21/2019 1130   LABSPEC 1.008 01/31/2024 0000   PHURINE 8.0 01/31/2024 0000   GLUCOSEU NEGATIVE 01/31/2024 0000   HGBUR SMALL (A) 01/31/2024 0000   BILIRUBINUR NEGATIVE 01/31/2024 0000   BILIRUBINUR Negative 03/21/2019 1130   KETONESUR NEGATIVE 01/31/2024 0000    PROTEINUR 100 (A) 01/31/2024 0000   NITRITE NEGATIVE 01/31/2024 0000   LEUKOCYTESUR LARGE (A) 01/31/2024 0000   Sepsis Labs: Invalid input(s): "PROCALCITONIN", "LACTICIDVEN"  Microbiology: Recent Results (from the past 240 hours)  Blood culture (routine x 2)     Status: None (Preliminary result)   Collection Time: 02/01/24 12:04 AM   Specimen: BLOOD  Result Value Ref Range Status   Specimen Description BLOOD SITE NOT SPECIFIED  Final   Special Requests   Final    BOTTLES DRAWN AEROBIC AND ANAEROBIC Blood Culture results may not be optimal due to an inadequate volume of blood received in culture bottles   Culture   Final    NO GROWTH 3 DAYS Performed at [facility], [address] [place], [place] [address]    Report Status PENDING  Incomplete  Culture, blood (Routine X 2) w Reflex to ID Panel     Status: None (Preliminary result)   Collection Time: 02/01/24 12:36 AM   Specimen: BLOOD  Result Value Ref Range Status   Specimen Description BLOOD SITE NOT SPECIFIED  Final   Special Requests   Final    BOTTLES DRAWN AEROBIC AND ANAEROBIC Blood Culture results may not be optimal due to an inadequate volume of blood received in culture bottles   Culture   Final    NO GROWTH 3 DAYS Performed at [facility], [address] [place], [place] [address]    Report Status PENDING  Incomplete  MRSA Next Gen by PCR, Nasal     Status: None   Collection Time: 02/01/24  2:44 PM   Specimen: Nasal Mucosa; Nasal Swab  Result Value Ref Range Status   MRSA by PCR Next Gen NOT DETECTED NOT DETECTED Final    Comment: (NOTE) The GeneXpert MRSA Assay (FDA approved for NASAL specimens only), is one component of a comprehensive MRSA colonization surveillance program. It is not intended to diagnose MRSA infection nor to guide or monitor treatment for MRSA infections. Test performance is not FDA approved in patients less than 74 years old. Performed at [facility], [address], [place], [place] [address]     Radiology Studies: No results found.      Makella Buckingham T. Sheppard Luckenbach [facility]  If 7PM-7AM, please contact night-coverage [email or web address] 02/04/2024, 2:41 PM

## 2024-02-05 DIAGNOSIS — E872 Acidosis, unspecified: Secondary | ICD-10-CM | POA: Diagnosis not present

## 2024-02-05 DIAGNOSIS — M4628 Osteomyelitis of vertebra, sacral and sacrococcygeal region: Secondary | ICD-10-CM | POA: Diagnosis not present

## 2024-02-05 DIAGNOSIS — D649 Anemia, unspecified: Secondary | ICD-10-CM | POA: Diagnosis not present

## 2024-02-05 DIAGNOSIS — N179 Acute kidney failure, unspecified: Secondary | ICD-10-CM | POA: Diagnosis not present

## 2024-02-05 LAB — VITAMIN B12: Vitamin B-12: 568 pg/mL (ref 180–914)

## 2024-02-05 LAB — RENAL FUNCTION PANEL
Albumin: <1.5 g/dL — ABNORMAL LOW (ref 3.5–5.0)
Anion gap: 11 (ref 5–15)
BUN: 17 mg/dL (ref 6–20)
CO2: 12 mmol/L — ABNORMAL LOW (ref 22–32)
Calcium: 8.1 mg/dL — ABNORMAL LOW (ref 8.9–10.3)
Chloride: 121 mmol/L — ABNORMAL HIGH (ref 98–111)
Creatinine, Ser: 1.59 mg/dL — ABNORMAL HIGH (ref 0.61–1.24)
GFR, Estimated: 51 mL/min — ABNORMAL LOW (ref >60)
Glucose, Bld: 164 mg/dL — ABNORMAL HIGH (ref 70–99)
Phosphorus: 2.7 mg/dL (ref 2.5–4.6)
Potassium: 2.9 mmol/L — ABNORMAL LOW (ref 3.5–5.1)
Sodium: 144 mmol/L (ref 135–145)

## 2024-02-05 LAB — CBC
HCT: 37.9 % — ABNORMAL LOW (ref 39.0–52.0)
Hemoglobin: 11.3 g/dL — ABNORMAL LOW (ref 13.0–17.0)
MCH: 25.4 pg — ABNORMAL LOW (ref 26.0–34.0)
MCHC: 29.8 g/dL — ABNORMAL LOW (ref 30.0–36.0)
MCV: 85.2 fL (ref 80.0–100.0)
Platelets: 539 10*3/uL — ABNORMAL HIGH (ref 150–400)
RBC: 4.45 MIL/uL (ref 4.22–5.81)
RDW: 19.7 % — ABNORMAL HIGH (ref 11.5–15.5)
WBC: 12.3 10*3/uL — ABNORMAL HIGH (ref 4.0–10.5)
nRBC: 0 % (ref 0.0–0.2)

## 2024-02-05 LAB — MAGNESIUM: Magnesium: 2.7 mg/dL — ABNORMAL HIGH (ref 1.7–2.4)

## 2024-02-05 LAB — AMMONIA: Ammonia: 23 umol/L (ref 9–35)

## 2024-02-05 MED ORDER — THIAMINE MONONITRATE 100 MG PO TABS: 100.0000 mg | ORAL_TABLET | Freq: Every day | ORAL | Status: DC

## 2024-02-05 MED ORDER — THIAMINE HCL 100 MG/ML IJ SOLN
250.0000 mg | Freq: Three times a day (TID) | INTRAVENOUS | Status: AC
  Administered 2024-02-07 – 2024-02-08 (×5): 250 mg via INTRAVENOUS
  Filled 2024-02-05 (×5): qty 2.5

## 2024-02-05 MED ORDER — SODIUM CHLORIDE 0.9 % IV SOLN: INTRAVENOUS | Status: DC | PRN

## 2024-02-05 MED ORDER — KCL IN DEXTROSE-NACL 10-5-0.45 MEQ/L-%-% IV SOLN
INTRAVENOUS | Status: AC
  Filled 2024-02-05 (×2): qty 1000

## 2024-02-05 MED ORDER — ENOXAPARIN SODIUM 30 MG/0.3ML IJ SOSY
30.0000 mg | PREFILLED_SYRINGE | INTRAMUSCULAR | Status: DC
Start: 2024-02-05 — End: 2024-02-10
  Administered 2024-02-05 – 2024-02-09 (×5): 30 mg via SUBCUTANEOUS
  Filled 2024-02-05 (×5): qty 0.3

## 2024-02-05 MED ORDER — POTASSIUM CHLORIDE 10 MEQ/100ML IV SOLN
10.0000 meq | INTRAVENOUS | Status: AC
  Administered 2024-02-05 (×4): 10 meq via INTRAVENOUS
  Filled 2024-02-05 (×4): qty 100

## 2024-02-05 MED ORDER — LINEZOLID 600 MG/300ML IV SOLN
600.0000 mg | Freq: Two times a day (BID) | INTRAVENOUS | Status: DC
  Administered 2024-02-05 – 2024-02-10 (×11): 600 mg via INTRAVENOUS
  Filled 2024-02-05 (×13): qty 300

## 2024-02-05 MED ORDER — POTASSIUM CHLORIDE 20 MEQ PO PACK
40.0000 meq | PACK | ORAL | Status: DC
  Filled 2024-02-05: qty 2

## 2024-02-05 MED ORDER — THIAMINE HCL 100 MG/ML IJ SOLN
500.0000 mg | Freq: Three times a day (TID) | INTRAVENOUS | Status: AC
  Administered 2024-02-05 – 2024-02-07 (×5): 500 mg via INTRAVENOUS
  Filled 2024-02-05: qty 500
  Filled 2024-02-05: qty 5
  Filled 2024-02-05: qty 500
  Filled 2024-02-05 (×2): qty 5

## 2024-02-05 NOTE — Progress Notes (Signed)
 Nurse will call back to advise of status of patient in order to proceed with EEG.

## 2024-02-05 NOTE — Progress Notes (Signed)
 Patient is combative and agitated - unable to complete eeg with 2 techs. I reached out to ordering provider about how to proceed (i.e. restraints or sedation).

## 2024-02-05 NOTE — Progress Notes (Signed)
 PROGRESS NOTE  [name] ZOX:[id number] DOB: July 29, 1966   PCP: [name], MD  Patient is from: Home.  Lives with sister.  Wheelchair-bound at baseline.  DOA: 01/31/2024 LOS: 4  Chief complaints Chief Complaint  Patient presents with   Fatigue   Weakness   Wound Check     Brief Narrative / Interim history: 58 year old M with PMH of spina bifid/neurogenic bladder with urostomy and colostomy, chronic sacral decubitus, chronic osteomyelitis of ischial tuberosity, inferior pubic ramus, HTN, anemia and recent hospitalization from 3/21-3/28 for septic shock in the setting of necrotizing soft tissue infection and sacral decubitus for which she was treated with vasopressor, broad-spectrum antibiotics and discharged on p.o. Augmentin  for 14 weeks per recommendation by ID.    Patient presents with generalized weakness.  Workup in ED concerning for acute on chronic osteomyelitis, acute on chronic anemia, AKI and hypokalemia.  He had mild leukocytosis to 14.4.  Hgb dropped from 8.3 (baseline) to 5.9 and 5.5 on repeat.  Cr 1.98 (was 1.0 on 3/27).  K2.2.  CT abdomen and pelvis concerning bilateral deep gluteal ulcers with acute on chronic pelvic osteomyelitis and progressive left hip septic joint.  Blood cultures obtained.  Started on broad-spectrum antibiotics and potassium supplementation.  2 units of blood ordered.  Orthopedic surgery consulted  The next day, transfused 2 units and hemoglobin improved to 11.5.  Evaluated by orthopedic surgery who did not feel there is need for surgical intervention.  ID initially recommended cefepime , oral Zyvox  and Flagyl  until 5/16 followed by Augmentin  for 4 weeks.  Hospital course complicated by effusion/delirium.  Cefepime  changed to ertapenem.  Flagyl  discontinued.  SNF hospital stay for wound care.  TOC consulted for placement when medically stable  Subjective: Seen and examined earlier this morning.  Refused medications last night.   Awake and alert but remains confused and less interactive.  He only moans "ooch".   Objective: Vitals:   02/05/24 0415 02/05/24 0855 02/05/24 0900 02/05/24 0936  BP: 101/64 (!) 97/58    Pulse: 69 79    Resp:  12 19 16   Temp: 98 F (36.7 C) (!) 97.4 F (36.3 C)    TempSrc: Axillary Oral    SpO2: 100% 100%    Weight:      Height:        Examination:  GENERAL: No apparent distress.  Nontoxic. HEENT: MMM.  Right eye appears normal.  Corneal cloudiness left eye (chronic).  Hard of hearing? Earwax in right ear.  Left ear clear. NECK: Supple.  No apparent JVD.  RESP:  No IWOB.  Fair aeration bilaterally. CVS:  RRR. Heart sounds normal.  ABD/GI/GU: BS+. Abd soft.  RLQ urostomy.  LLQ colostomy. MSK/EXT: BLE deformity and atrophy. SKIN: Unstageable sacral decubitus.  See picture under media. NEURO: Awake and alert.  Oriented to self, place, month and year.  No apparent focal neuro deficit. PSYCH: Calm. Normal affect.   Consultants:  Orthopedic surgery Infectious disease  Procedures: None  Microbiology summarized: Blood cultures NGTD  Assessment and plan: Sepsis secondary to acute on chronic osteomyelitis of the ischial tuberosity/inferior pubic ramus Left hip septic joint Spina bifida with sacral decubitus ulcer stage IV -Recently hospitalized for septic shock and discharged on Augmentin  for 14 weeks -CT-bilateral deep gluteal ulcers with acute on chronic pelvic osteo and progressive left hip septic joint.  . -CRP 25.  ESR 140.  Blood cultures NGTD. -Per Ortho, no good surgical remedy -IR consulted for drainage of left hip but recommended  treating based on positive culture on 3/21 -Continue linezolid .  -Changed cefepime  to ertapenem due to mental status change/confusion.  Discontinued Flagyl  -Ertapenem and linezolid  until 5/16 followed by 4 weeks of Augmentin  per ID. -SNF hospital stay for wound care. -PICC line placed on 5/2. -Wound care -RD consulted for optimize  nutrition  Acute encephalopathy/delirium: Patient is awake but not interacting.  Seems to have diminished hearing as well.  Has been on cefepime  for osteomyelitis.  Not on sedating medication other than low-dose Seroquel .  Ammonia, TSH and B12 normal.  CT with hydrocephalus and Chiari malformation (both chronic).  Not able to obtain EEG due to agitation when attempted.  Low suspicion for seizure regardless.  Has significant electrolyte derangement -Change cefepime  to ertapenem -Trial of high-dose thiamine  - Correct electrolytes and hydrate - Reorientation and delirium precaution  Oropharyngeal dysphagia in the setting of encephalopathy - Change essential meds to IV - N.p.o. per SLP - IV fluid with D5  Acute on chronic anemia: Hgb dropped from 8.3-5.5.  Likely dilutional from IV fluid resuscitation.  No overt bleeding.  Improved to 11.6 after 2 units. Recent Labs    12/28/23 0122 12/28/23 0505 02/01/24 0010 02/01/24 0405 02/01/24 0439 02/01/24 1942 02/02/24 0535 02/03/24 0610 02/04/24 0925 02/05/24 0237  HGB 8.6* 8.4* 8.3* 5.9* 5.5* 11.6* 11.8* 11.7* 11.5* 11.3*  -Continue monitoring  AKI: B/l Cr ~1.0.  Likely hypovolemic.  Not on nephrotoxic meds.  Recent Labs    12/27/23 1107 12/28/23 0103 12/29/23 0400 02/01/24 0010 02/01/24 0405 02/01/24 1942 02/02/24 0552 02/03/24 0610 02/04/24 0925 02/05/24 0237  BUN 13 14 11  32* 27* 22* 17 12 14 17   CREATININE 1.38* 1.21 1.07 1.98* 1.78* 1.52* 1.39* 1.38* 1.40* 1.59*  -IV fluid as above -Avoid nephrotoxic meds. -Continue midodrine  for blood pressure support -Closely monitor urine output   Chronic hypotension: On midodrine  at home.  Cortisol within normal.  TSH slightly low but free T4 normal.  Normal TTE in 2017. -Increased midodrine  from 10 to 15 mg 3 times daily on 4/30 -Discontinued Flomax .  Diminished hearing/earwax in right ear: Left mastoid/middle ear effusion on CT. -Antibiotics as above -Debrox 3 times daily    Prolonged QTc: QTc 599 in the setting of severe hypokalemia.  Improved to 470. - Avoid or minimize QT prolonging drugs - Continue telemetry - Optimize electrolytes  Hypokalemia/hypomagnesemia/hypophosphatemia:  - Monitor replenish as appropriate   Hyponatremia: Resolved. -Continue monitoring  Hypernatremia: Likely due to poor p.o. intake/dehydration. -D5-half NS-KCl at 75 cc an hour -Recheck in the morning   Ventriculomegaly/Chiari malformation-chronic. History of spina bifida with a stage IV ulcer, neurogenic bladder, urostomy and colostomy -Continue wound and ostomy care -Discontinue Flomax .  Does not help with neurogenic bladder   Non-anion gap metabolic acidosis: Likely due to renal failure.  -Manage renal failure as above -Continue sodium bicarbonate   Nutrition/dysphagia Body mass index is 24.56 kg/m. - Reconsult dietitian when he starts taking p.o.        Unstageable pressure skin injury of bilateral buttocks: Present on arrival Pressure Injury 12/24/23 Buttocks Right (Active)  12/24/23 0100  Location: Buttocks  Location Orientation: Right  Staging:   Wound Description (Comments):   Present on Admission: Yes     Pressure Injury 12/24/23 Buttocks Left (Active)  12/24/23 0100  Location: Buttocks  Location Orientation: Left  Staging:   Wound Description (Comments):   Present on Admission: Yes   DVT prophylaxis:  SCDs Start: 02/01/24 0204 Place TED hose Start: 02/01/24 0204  Code Status: Full  code Family Communication: Updated patient sister over the phone Level of care: Telemetry Medical Status is: Inpatient Remains inpatient appropriate because: Acute on chronic osteomyelitis, anemia, AKI and electrolyte derangements   Final disposition: SNF   55 minutes with more than 50% spent in reviewing records, counseling patient/family and coordinating care.   Sch Meds:  Scheduled Meds:  carbamide peroxide  5 drop Right EAR TID   Chlorhexidine   Gluconate Cloth  6 each Topical Daily   midodrine   15 mg Oral TID WC   QUEtiapine   25 mg Oral QHS   rosuvastatin   10 mg Oral QHS   sodium bicarbonate   1,300 mg Oral TID   sodium chloride  flush  3 mL Intravenous Q12H   sucralfate   1 g Oral TID   [START ON 02/09/2024] thiamine   100 mg Oral Daily   Continuous Infusions:  0.45 % NaCl with KCl 20 mEq / L 75 mL/hr at 02/05/24 0651   sodium chloride  10 mL/hr at 02/05/24 1009   ertapenem Stopped (02/04/24 1855)   linezolid  (ZYVOX ) IV 600 mg (02/05/24 1010)   potassium chloride  10 mEq (02/05/24 1055)   thiamine  (VITAMIN B1) injection     Followed by   Cecily Cohen ON 02/07/2024] thiamine  (VITAMIN B1) injection     PRN Meds:.sodium chloride , acetaminophen  **OR** acetaminophen , prochlorperazine , sodium chloride  flush, sodium chloride  flush  Antimicrobials: Anti-infectives (From admission, onward)    Start     Dose/Rate Route Frequency Ordered Stop   02/05/24 1045  linezolid  (ZYVOX ) IVPB 600 mg        600 mg 300 mL/hr over 60 Minutes Intravenous Every 12 hours 02/05/24 0945 02/17/24 0959   02/05/24 0045  linezolid  (ZYVOX ) IVPB 600 mg        600 mg 300 mL/hr over 60 Minutes Intravenous  Once 02/04/24 2349 02/05/24 0933   02/04/24 1700  ertapenem (INVANZ) 1 g in sodium chloride  0.9 % 100 mL IVPB        1 g 200 mL/hr over 30 Minutes Intravenous Daily 02/04/24 1600 02/17/24 0959   02/02/24 2200  ceFEPIme  (MAXIPIME ) 2 g in sodium chloride  0.9 % 100 mL IVPB  Status:  Discontinued        2 g 200 mL/hr over 30 Minutes Intravenous Every 12 hours 02/02/24 1600 02/04/24 1600   02/02/24 2200  metroNIDAZOLE  (FLAGYL ) tablet 500 mg  Status:  Discontinued        500 mg Oral Every 12 hours 02/02/24 1600 02/04/24 1600   02/02/24 2200  linezolid  (ZYVOX ) tablet 600 mg  Status:  Discontinued        600 mg Oral Every 12 hours 02/02/24 1600 02/05/24 0945   02/01/24 1815  DAPTOmycin  (CUBICIN ) 350 mg in sodium chloride  0.9 % IVPB  Status:  Discontinued        8 mg/kg   46 kg 114 mL/hr over 30 Minutes Intravenous Daily 02/01/24 1719 02/02/24 1600   02/01/24 0700  piperacillin -tazobactam (ZOSYN ) IVPB 3.375 g  Status:  Discontinued        3.375 g 12.5 mL/hr over 240 Minutes Intravenous Every 8 hours 02/01/24 0254 02/02/24 1600   02/01/24 0348  vancomycin  variable dose per unstable renal function (pharmacist dosing)  Status:  Discontinued         Does not apply See admin instructions 02/01/24 0348 02/01/24 1657   02/01/24 0015  vancomycin  (VANCOCIN ) IVPB 1000 mg/200 mL premix        1,000 mg 200 mL/hr over 60 Minutes Intravenous  Once 02/01/24  0010 02/01/24 0242   02/01/24 0015  piperacillin -tazobactam (ZOSYN ) IVPB 3.375 g        3.375 g 100 mL/hr over 30 Minutes Intravenous  Once 02/01/24 0010 02/01/24 0148        I have personally reviewed the following labs and images: CBC: Recent Labs  Lab 02/01/24 0010 02/01/24 0405 02/01/24 1942 02/02/24 0535 02/03/24 0610 02/04/24 0925 02/05/24 0237  WBC 14.4*   < > 8.0 8.4 11.8* 11.0* 12.3*  NEUTROABS 11.8*  --   --   --   --   --   --   HGB 8.3*   < > 11.6* 11.8* 11.7* 11.5* 11.3*  HCT 28.3*   < > 37.6* 37.1* 38.0* 37.5* 37.9*  MCV 77.3*   < > 82.8 81.2 83.2 83.7 85.2  PLT 566*   < > 379 512* 540* 513* 539*   < > = values in this interval not displayed.   BMP &GFR Recent Labs  Lab 02/01/24 0403 02/01/24 0405 02/01/24 1942 02/02/24 0552 02/03/24 0610 02/04/24 0925 02/05/24 0237  NA  --    < > 138 142 144 146* 144  K  --    < > 5.0 3.4* 3.8 2.8* 2.9*  CL  --    < > 117* 117* 124* 119* 121*  CO2  --    < > 11* 14* 11* 15* 12*  GLUCOSE  --    < > 144* 95 82 76 164*  BUN  --    < > 22* 17 12 14 17   CREATININE  --    < > 1.52* 1.39* 1.38* 1.40* 1.59*  CALCIUM   --    < > 7.7* 8.0* 8.1* 8.2* 8.1*  MG 2.0  --   --   --  1.8 1.6* 2.7*  PHOS  --   --  2.1* 2.5 2.4* 2.1* 2.7   < > = values in this interval not displayed.   Estimated Creatinine Clearance: 29.5 mL/min (A) (by C-G formula based on  SCr of 1.59 mg/dL (H)). Liver & Pancreas: Recent Labs  Lab 02/01/24 0010 02/01/24 0405 02/01/24 1942 02/02/24 0552 02/03/24 0610 02/04/24 0925 02/05/24 0237  AST 27 22  --   --   --   --   --   ALT 18 12  --   --   --   --   --   ALKPHOS 127* 84  --   --   --   --   --   BILITOT 0.6 0.9  --   --   --   --   --   PROT 6.6 5.4*  --   --   --   --   --   ALBUMIN  <1.5* 1.7* 1.6* 2.2* 1.9* 1.6* <1.5*   No results for input(s): "LIPASE", "AMYLASE" in the last 168 hours. Recent Labs  Lab 02/05/24 0320  AMMONIA 23   Diabetic: No results for input(s): "HGBA1C" in the last 72 hours. No results for input(s): "GLUCAP" in the last 168 hours. Cardiac Enzymes: Recent Labs  Lab 02/02/24 0552  CKTOTAL 27*   No results for input(s): "PROBNP" in the last 8760 hours. Coagulation Profile: No results for input(s): "INR", "PROTIME" in the last 168 hours. Thyroid  Function Tests: No results for input(s): "TSH", "T4TOTAL", "FREET4", "T3FREE", "THYROIDAB" in the last 72 hours.  Lipid Profile: No results for input(s): "CHOL", "HDL", "LDLCALC", "TRIG", "CHOLHDL", "LDLDIRECT" in the last 72 hours. Anemia Panel: Recent Labs  02/05/24 0237  VITAMINB12 568   Urine analysis:    Component Value Date/Time   COLORURINE AMBER (A) 01/31/2024 0000   APPEARANCEUR CLOUDY (A) 01/31/2024 0000   APPEARANCEUR Cloudy (A) 03/21/2019 1130   LABSPEC 1.008 01/31/2024 0000   PHURINE 8.0 01/31/2024 0000   GLUCOSEU NEGATIVE 01/31/2024 0000   HGBUR SMALL (A) 01/31/2024 0000   BILIRUBINUR NEGATIVE 01/31/2024 0000   BILIRUBINUR Negative 03/21/2019 1130   KETONESUR NEGATIVE 01/31/2024 0000   PROTEINUR 100 (A) 01/31/2024 0000   NITRITE NEGATIVE 01/31/2024 0000   LEUKOCYTESUR LARGE (A) 01/31/2024 0000   Sepsis Labs: Invalid input(s): "PROCALCITONIN", "LACTICIDVEN"  Microbiology: Recent Results (from the past 240 hours)  Blood culture (routine x 2)     Status: None (Preliminary result)   Collection Time:  02/01/24 12:04 AM   Specimen: BLOOD  Result Value Ref Range Status   Specimen Description BLOOD SITE NOT SPECIFIED  Final   Special Requests   Final    BOTTLES DRAWN AEROBIC AND ANAEROBIC Blood Culture results may not be optimal due to an inadequate volume of blood received in culture bottles   Culture   Final    NO GROWTH 4 DAYS Performed at [facility], [address], [place], [place] [address]    Report Status PENDING  Incomplete  Culture, blood (Routine X 2) w Reflex to ID Panel     Status: None (Preliminary result)   Collection Time: 02/01/24 12:36 AM   Specimen: BLOOD  Result Value Ref Range Status   Specimen Description BLOOD SITE NOT SPECIFIED  Final   Special Requests   Final    BOTTLES DRAWN AEROBIC AND ANAEROBIC Blood Culture results may not be optimal due to an inadequate volume of blood received in culture bottles   Culture   Final    NO GROWTH 4 DAYS Performed at [facility], [address], [place], [place] [address]    Report Status PENDING  Incomplete  MRSA Next Gen by PCR, Nasal     Status: None   Collection Time: 02/01/24  2:44 PM   Specimen: Nasal Mucosa; Nasal Swab  Result Value Ref Range Status   MRSA by PCR Next Gen NOT DETECTED NOT DETECTED Final    Comment: (NOTE) The GeneXpert MRSA Assay (FDA approved for NASAL specimens only), is one component of a comprehensive MRSA colonization surveillance program. It is not intended to diagnose MRSA infection nor to guide or monitor treatment for MRSA infections. Test performance is not FDA approved in patients less than 90 years old. Performed at [facility], [address], [place], [place] [address]     Radiology Studies: CT HEAD WO CONTRAST ( ) Result Date: 02/04/2024 CLINICAL DATA:  Mental status change.  Unknown cause. EXAM: CT HEAD WITHOUT CONTRAST TECHNIQUE: Contiguous axial images were obtained from the base of the skull through the vertex without intravenous contrast. RADIATION  DOSE REDUCTION: This exam was performed according to the departmental dose-optimization program which includes automated exposure control, adjustment of the mA and/or kV according to patient size and/or use of iterative reconstruction technique. COMPARISON:  None Available. FINDINGS: Brain: The cerebellar tonsils herniate below the foramen magnum. Extend to at least the level of C1. The inferior extent is not visualized. The posterior fossa is small. Marked ventricular enlargement present bilaterally with chronic thinning of the cerebral white matter. Moderate enlargement of third ventricle is noted. Marked thinning of the cerebral white matter is present bilaterally. Acute cortical infarct or  hemorrhage is present. No mass lesion is present. Corpus callosum is stretched over the enlarged ventricles somewhat thin. Vascular: No hyperdense vessel or unexpected calcification. Skull: A right paramedian frontal calvarium defect may be a remote burr hole. Scalp ventriculostomy tubing extends along the left frontoparietal scalp. No shunt is present. Sinuses/Orbits: Left middle ear and mastoid effusion is present. No obstructing nasopharyngeal lesion is present. The paranasal sinuses and mastoid air cells are otherwise clear. The left globe is calcified. IMPRESSION: 1. No acute intracranial abnormality. 2. Marked ventricular enlargement bilaterally with chronic thinning of the cerebral white matter. This likely reflects chronic hydrocephalus. 3. The cerebellar tonsils herniate below the foramen magnum. Extend to at least the level of C1. The inferior extent is not visualized. Findings are consistent with a Chiari malformation. 4. Left middle ear and mastoid effusion. No obstructing nasopharyngeal lesion is present. 5. Calcified left globe. Electronically Signed   By: Audree Leas M.D.   On: 02/04/2024 18:39        Demyah Smyre T. Timothy Trudell [facility]  If 7PM-7AM, please contact  night-coverage [email or web address] 02/05/2024, 12:06 PM

## 2024-02-05 NOTE — Evaluation (Signed)
 Clinical/Bedside Swallow Evaluation Patient Details  Name: [name] MRN: [id number] Date of Birth: [birth year]  Today's Date: 02/05/2024 Time: SLP Start Time (ACUTE ONLY): 1108 SLP Stop Time (ACUTE ONLY): 1125 SLP Time Calculation (min) (ACUTE ONLY): 17 min  Past Medical History:  Past Medical History:  Diagnosis Date   Abdominal distention 10/11/2016   Acute lower UTI 12/22/2019   Acute sepsis (HCC) 04/21/2016   AKI (acute kidney injury) (HCC) 06/27/2020   Decubitus ulcer    Distal intestinal obstruction syndrome (HCC) 10/11/2016   Fecal impaction in rectum (HCC) 10/10/2016   Gastric outlet obstruction 06/03/2016   Gastritis    GERD (gastroesophageal reflux disease) 12/22/2019   HTN (hypertension) 06/27/2020   Hydronephrosis    Hyperlipidemia    Hypokalemia 10/11/2016   Impaction of the bowels (HCC)    Kidney stone    Left ureteral stone 06/27/2020   Leukocytosis 10/11/2016   Loose stools 10/11/2016   Nausea & vomiting 06/03/2016   Renal disorder    SBO (small bowel obstruction) (HCC) 11/02/2017   Sepsis due to gram-negative UTI (HCC) 03/08/2021   Septic shock (HCC) 03/08/2021   SIRS (systemic inflammatory response syndrome) (HCC) 06/03/2016   Spina bifida (HCC)    Ulcer of esophagus with bleeding    Past Surgical History:  Past Surgical History:  Procedure Laterality Date   ABDOMINAL SURGERY     APPENDECTOMY     BACK SURGERY     COLON SURGERY     CREATION / REVISION OF ILEOSTOMY / JEJUNOSTOMY  05/2015   CYSTOSCOPY WITH RETROGRADE PYELOGRAM, URETEROSCOPY AND STENT PLACEMENT  03/2014   ESOPHAGOGASTRODUODENOSCOPY (EGD) WITH PROPOFOL  N/A 06/03/2016   Procedure: ESOPHAGOGASTRODUODENOSCOPY (EGD) WITH PROPOFOL ;  Surgeon: Marnee Sink, MD;  Location: [facility] ENDOSCOPY;  Service: Endoscopy;  Laterality: N/A;   ESOPHAGOGASTRODUODENOSCOPY (EGD) WITH PROPOFOL  N/A 04/27/2019   Procedure: ESOPHAGOGASTRODUODENOSCOPY (EGD) WITH PROPOFOL ;  Surgeon: Luke Salaam, MD;  Location: [facility] ENDOSCOPY;   Service: Gastroenterology;  Laterality: N/A;   ilieostomy     IRRIGATION AND DEBRIDEMENT OF NECROTIZING SOFT TISSUE INFECTION Bilateral 12/23/2023   Procedure: IRRIGATION AND DEBRIDEMENT OF NECROTIZING SOFT TISSUE INFECTION;  Surgeon: Edmon Gosling, MD;  Location: [facility] OR;  Service: General;  Laterality: Bilateral;  prone   PERCUTANEOUS NEPHROSTOLITHOTOMY     PERCUTANEOUS NEPHROSTOMY     VENTRICULOPERITONEAL SHUNT     vp shunt removal     HPI:  [name] is a 58 year old M who presents with generalized weakness.  Workup in ED concerning for acute on chronic osteomyelitis, acute on chronic anemia, AKI and hypokalemia. Head CT 5/2 with no acute findings.  No chest imaging. Pt with PMH of spina bifid/neurogenic bladder with urostomy and colostomy, chronic sacral decubitus, chronic osteomyelitis of ischial tuberosity, inferior pubic ramus, HTN, anemia and recent hospitalization from 3/21-3/28 for septic shock in the setting of necrotizing soft tissue infection and sacral decubitus.    Assessment / Plan / Recommendation  Clinical Impression  SLP is unable to make safe diet recommendation at this time as pt will not accept POs.  Pt is defensive and guarding.  He pushes away PO trials presented. Pt did not benefit from hand over hand assistance. He is typically conversant, but today only said "ow" or moaned.  He did not answer questions or follow directions.  A small amount of water was placed in oral cavity by spoon.  It does appeared he swallowed this as it was not lost to anterior spillage.  Pt had been gagging and coughing with  medications.  Today per nursing report pt is orally holding medications.  At present pt cannot take oral meds and appears unable to take PO nutrition.  Pt with no AMN at this time. SLP will follow for PO readiness.    Recommend pt remain NPO at present with alternate means of medication.   SLP Visit Diagnosis: Dysphagia, unspecified (R13.10)    Aspiration Risk  Risk  for inadequate nutrition/hydration    Diet Recommendation NPO    Medication Administration: Via alternative means    Other  Recommendations Oral Care Recommendations: Oral care QID    Recommendations for follow up therapy are one component of a multi-disciplinary discharge planning process, led by the attending physician.  Recommendations may be updated based on patient status, additional functional criteria and insurance authorization.  Follow up Recommendations  (TBD)      Assistance Recommended at Discharge  N/A  Functional Status Assessment Patient has had a recent decline in their functional status and demonstrates the ability to make significant improvements in function in a reasonable and predictable amount of time.  Frequency and Duration min 2x/week  2 weeks       Prognosis   Good with continued recovery     Swallow Study   General Date of Onset: 02/03/24 HPI: [name] is a 58 year old M who presents with generalized weakness.  Workup in ED concerning for acute on chronic osteomyelitis, acute on chronic anemia, AKI and hypokalemia. Head CT 5/2 with no acute findings.  No chest imaging. Pt with PMH of spina bifid/neurogenic bladder with urostomy and colostomy, chronic sacral decubitus, chronic osteomyelitis of ischial tuberosity, inferior pubic ramus, HTN, anemia and recent hospitalization from 3/21-3/28 for septic shock in the setting of necrotizing soft tissue infection and sacral decubitus. Type of Study: Bedside Swallow Evaluation Previous Swallow Assessment: none Diet Prior to this Study: Regular;Thin liquids (Level 0) (but RN holding POs) Respiratory Status: Nasal cannula History of Recent Intubation: No Behavior/Cognition: Alert;Uncooperative Oral Cavity Assessment: Within Functional Limits Oral Care Completed by SLP: No Oral Cavity - Dentition: Adequate natural dentition Patient Positioning: Upright in bed Baseline Vocal Quality: Normal Volitional Cough:  Cognitively unable to elicit Volitional Swallow: Unable to elicit    Oral/Motor/Sensory Function Overall Oral Motor/Sensory Function:  (Unable to assess)   Ice Chips Ice chips: Not tested   Thin Liquid Thin Liquid: Impaired Presentation: Spoon Oral Phase Impairments: Poor awareness of bolus    Nectar Thick Nectar Thick Liquid: Not tested   Honey Thick Honey Thick Liquid: Not tested   Puree Other Comments: Did not accept   Solid     Solid: Not tested      Chase Grim, MA, CCC-SLP Acute Rehabilitation Services Office: [phone] 02/05/2024,11:43 AM

## 2024-02-05 NOTE — Progress Notes (Signed)
   02/04/24 2348  Provider Notification  Provider Name/Title Dr. Del Favia  Date Provider Notified 02/04/24  Time Provider Notified 2348  Method of Notification Page  Notification Reason Change in status  Provider response See new orders  Date of Provider Response 02/04/24  Time of Provider Response 2356   On Friday night, when I gave patient his HS medications, he was coughing and gagging when taking them. Day RN stated he also gagged on his medications during the day. Tonight he opens his eyes when I address him but then closes them and proceeds to ignore me. I do not feel safe attempting to give him his HS medications and he is continuing to ignore me and the NT. He is receiving PO abx also. Vs are stable. Dr. Del Favia made aware.  PO antibiotic changed to IV antibiotic.  Will continue to monitor patient.  Necia Bali RN

## 2024-02-05 NOTE — Progress Notes (Addendum)
 Initial Nutrition Assessment  DOCUMENTATION CODES:   Not applicable  INTERVENTION:  -1 packet Juven BID, each packet provides 95 calories, 2.5 grams of protein (collagen), and 9.8 grams of carbohydrate (3 grams sugar); also contains 7 grams of L-arginine and L-glutamine, 300 mg vitamin C , 15 mg vitamin E, 1.2 mcg vitamin B-12, 9.5 mg zinc , 200 mg calcium , and 1.5 g  Calcium  Beta-hydroxy-Beta-methylbutyrate to support wound healing  If NGT is placed, recommend: Osmolite 1.5 at 40 ml/h (960 ml per day) *Initiate at 20 mL and titrate by 10mL q6h Prosource TF20 60 ml daily  Provides 1520 kcal, 80 gm protein, 730 ml free water daily   NUTRITION DIAGNOSIS:   Increased nutrient needs related to wound healing as evidenced by estimated needs.  GOAL:   Patient will meet greater than or equal to 90% of their needs  MONITOR:   Diet advancement  REASON FOR ASSESSMENT:   Consult Assessment of nutrition requirement/status  ASSESSMENT:   PMH spina bifida status post urostomy/colostomy/neurogenic bladder, chronic sacral decubitus ulcer, chronic osteomyelitis of the ischial tuberosity/inferior pubic ramus, microcytic anemia, prediabetic, essential hypertension, CKD stage II and chronic wheelchair-bound. Adm for osteomyelitis of sacrum.  SLP eval today, recommending NPO. Chart notes reveal pt is not taking po meds and is highly agitated and combative, has significant electrolyte derangement.  Essential meds changed to IV.   Medications reviewed and include: IV abx, sodium bicarb, thiamine , D5 IVF 75 mL/hr, IV potassium  Labs reviewed: Potassium 2.9 L, Creatinine 1.59 H, Mag 2.7 H, Albumin  <1.5 L   Intake/Output Summary (Last 24 hours) at 02/05/2024 1220 Last data filed at 02/05/2024 0856 Gross per 24 hour  Intake 802.24 ml  Output 900 ml  Net -97.76 ml    Weights reviewed. Admit weight 46.2 kg. -7kg (13%) wt loss in 6 months.    NUTRITION - FOCUSED PHYSICAL EXAM:  Defer to  f/u  Diet Order:   Diet Order             Diet NPO time specified  Diet effective midnight                   EDUCATION NEEDS:   Not appropriate for education at this time  Skin:  Skin Assessment: Skin Integrity Issues: Skin Integrity Issues:: Unstageable Unstageable: R buttocks  Last BM:  PTA  Height:   Ht Readings from Last 1 Encounters:  01/31/24 4\' 6"  (1.372 m)    Weight:   Wt Readings from Last 1 Encounters:  02/02/24 46.2 kg    BMI:  Body mass index is 24.56 kg/m.  Estimated Nutritional Needs:   Kcal:  1400-1650  Protein:  70-90  Fluid:  <1.4 L or per MD  Laren Player, MPH, RD, LDN Clinical Dietitian Contact information can be found at [facility].

## 2024-02-06 ENCOUNTER — Inpatient Hospital Stay (HOSPITAL_COMMUNITY)
Admit: 2024-02-06 | Discharge: 2024-02-06 | Disposition: A | Discharge status: Home / Self Care | Attending: Neurology | Admitting: Neurology

## 2024-02-06 ENCOUNTER — Inpatient Hospital Stay (HOSPITAL_COMMUNITY)

## 2024-02-06 ENCOUNTER — Inpatient Hospital Stay (HOSPITAL_COMMUNITY): Admit: 2024-02-06

## 2024-02-06 DIAGNOSIS — M86659 Other chronic osteomyelitis, unspecified thigh: Secondary | ICD-10-CM

## 2024-02-06 DIAGNOSIS — G9340 Encephalopathy, unspecified: Secondary | ICD-10-CM | POA: Diagnosis not present

## 2024-02-06 DIAGNOSIS — R569 Unspecified convulsions: Secondary | ICD-10-CM | POA: Diagnosis not present

## 2024-02-06 DIAGNOSIS — R410 Disorientation, unspecified: Secondary | ICD-10-CM | POA: Diagnosis not present

## 2024-02-06 DIAGNOSIS — Q054 Unspecified spina bifida with hydrocephalus: Secondary | ICD-10-CM

## 2024-02-06 DIAGNOSIS — L89159 Pressure ulcer of sacral region, unspecified stage: Secondary | ICD-10-CM | POA: Diagnosis not present

## 2024-02-06 DIAGNOSIS — R4182 Altered mental status, unspecified: Secondary | ICD-10-CM | POA: Diagnosis not present

## 2024-02-06 DIAGNOSIS — M4628 Osteomyelitis of vertebra, sacral and sacrococcygeal region: Secondary | ICD-10-CM | POA: Diagnosis not present

## 2024-02-06 LAB — CULTURE, BLOOD (ROUTINE X 2)
Culture: NO GROWTH
Culture: NO GROWTH

## 2024-02-06 LAB — COMPREHENSIVE METABOLIC PANEL WITH GFR
ALT: 12 U/L (ref 0–44)
AST: 16 U/L (ref 15–41)
Albumin: <1.5 g/dL — ABNORMAL LOW (ref 3.5–5.0)
Alkaline Phosphatase: 64 U/L (ref 38–126)
Anion gap: 5 (ref 5–15)
BUN: 17 mg/dL (ref 6–20)
CO2: 14 mmol/L — ABNORMAL LOW (ref 22–32)
Calcium: 7.9 mg/dL — ABNORMAL LOW (ref 8.9–10.3)
Chloride: 121 mmol/L — ABNORMAL HIGH (ref 98–111)
Creatinine, Ser: 1.52 mg/dL — ABNORMAL HIGH (ref 0.61–1.24)
GFR, Estimated: 53 mL/min — ABNORMAL LOW (ref >60)
Glucose, Bld: 175 mg/dL — ABNORMAL HIGH (ref 70–99)
Potassium: 3.9 mmol/L (ref 3.5–5.1)
Sodium: 140 mmol/L (ref 135–145)
Total Bilirubin: 0.3 mg/dL (ref 0.0–1.2)
Total Protein: 5 g/dL — ABNORMAL LOW (ref 6.5–8.1)

## 2024-02-06 LAB — PHOSPHORUS: Phosphorus: 2.1 mg/dL — ABNORMAL LOW (ref 2.5–4.6)

## 2024-02-06 LAB — CBC
HCT: 35.4 % — ABNORMAL LOW (ref 39.0–52.0)
Hemoglobin: 10.5 g/dL — ABNORMAL LOW (ref 13.0–17.0)
MCH: 25.7 pg — ABNORMAL LOW (ref 26.0–34.0)
MCHC: 29.7 g/dL — ABNORMAL LOW (ref 30.0–36.0)
MCV: 86.6 fL (ref 80.0–100.0)
Platelets: 466 10*3/uL — ABNORMAL HIGH (ref 150–400)
RBC: 4.09 MIL/uL — ABNORMAL LOW (ref 4.22–5.81)
RDW: 20.2 % — ABNORMAL HIGH (ref 11.5–15.5)
WBC: 8.8 10*3/uL (ref 4.0–10.5)
nRBC: 0 % (ref 0.0–0.2)

## 2024-02-06 LAB — MAGNESIUM: Magnesium: 2.2 mg/dL (ref 1.7–2.4)

## 2024-02-06 MED ORDER — LEVETIRACETAM IN NACL 1000 MG/100ML IV SOLN
1000.0000 mg | Freq: Once | INTRAVENOUS | Status: AC
Start: 2024-02-06 — End: 2024-02-06
  Administered 2024-02-06: 1000 mg via INTRAVENOUS
  Filled 2024-02-06: qty 100

## 2024-02-06 MED ORDER — MIDODRINE HCL 5 MG PO TABS
10.0000 mg | ORAL_TABLET | ORAL | Status: DC
Start: 2024-02-06 — End: 2024-02-06

## 2024-02-06 MED ORDER — SODIUM BICARBONATE 8.4 % IV SOLN
INTRAVENOUS | Status: DC
  Filled 2024-02-06 (×2): qty 75

## 2024-02-06 MED ORDER — LACTATED RINGERS IV BOLUS
250.0000 mL | INTRAVENOUS | Status: AC
  Administered 2024-02-06: 250 mL via INTRAVENOUS

## 2024-02-06 MED ORDER — ALBUMIN HUMAN 25 % IV SOLN
25.0000 g | INTRAVENOUS | Status: AC
  Administered 2024-02-06: 25 g via INTRAVENOUS
  Filled 2024-02-06: qty 100

## 2024-02-06 MED ORDER — KCL IN DEXTROSE-NACL 10-5-0.45 MEQ/L-%-% IV SOLN
INTRAVENOUS | Status: DC
  Filled 2024-02-06: qty 1000

## 2024-02-06 MED ORDER — LEVETIRACETAM IN NACL 1000 MG/100ML IV SOLN
1000.0000 mg | Freq: Two times a day (BID) | INTRAVENOUS | Status: DC
  Administered 2024-02-07 (×2): 1000 mg via INTRAVENOUS
  Filled 2024-02-06 (×3): qty 100

## 2024-02-06 NOTE — Consult Note (Signed)
 NEUROLOGY CONSULT NOTE   Date of service: Feb 06, 2024 Patient Name: Scott Chessman MRN:  [id number] DOB:  [birth year] Chief Complaint: "altered mental status" Requesting Provider: Theadore Finger, MD  History of Present Illness  Dayten Felland is a 58 y.o. male with hx spina bifida status post urostomy/colostomy/neurogenic bladder, chronic sacral decubitus ulcer, chronic osteomyelitis of the ischial tuberosity/inferior pubic ramus, microcytic anemia, prediabetic, essential hypertension, CKD stage II and chronic wheelchair-bound presented to emergency department complaining of generalized weakness for 3 days with associated worsening sacral wound on 4/30. Neurology was consulted in the setting of encephalopathy.   On initial assessment, the patient's sister is bedside and most contributive to history. Reports patient's mentation started to worsen on Thursday while on a phone call with her.  Reports patient complained of difficulty hearing. She also noted he was having word-finding difficulty and when speaking sounded lethargic/drowsy "almost as if he was pain killers".  She came to visit him last night and noted increased agitation and and was only "moaning".  At was unable to follow commands or express his thoughts clearly through talking.  Presently, the patient is disoriented and unable to follow verbal commands.  She also feels that patient is favoring his right side and less mobile with his upper extremities than baseline.  At his baseline patient is alert, oriented engage in conversation. He does have some weakness in his upper extremities and is wheel chair bound. She denies a history of seizures, alcohol use or recent additional substance use.    ROS  Unable to obtain primarily due to patient's inability to follow commands and participate in  Past History   Past Medical History:  Diagnosis Date   Abdominal distention 10/11/2016   Acute lower UTI 12/22/2019   Acute  sepsis (HCC) 04/21/2016   AKI (acute kidney injury) (HCC) 06/27/2020   Decubitus ulcer    Distal intestinal obstruction syndrome (HCC) 10/11/2016   Fecal impaction in rectum (HCC) 10/10/2016   Gastric outlet obstruction 06/03/2016   Gastritis    GERD (gastroesophageal reflux disease) 12/22/2019   HTN (hypertension) 06/27/2020   Hydronephrosis    Hyperlipidemia    Hypokalemia 10/11/2016   Impaction of the bowels (HCC)    Kidney stone    Left ureteral stone 06/27/2020   Leukocytosis 10/11/2016   Loose stools 10/11/2016   Nausea & vomiting 06/03/2016   Renal disorder    SBO (small bowel obstruction) (HCC) 11/02/2017   Sepsis due to gram-negative UTI (HCC) 03/08/2021   Septic shock (HCC) 03/08/2021   SIRS (systemic inflammatory response syndrome) (HCC) 06/03/2016   Spina bifida (HCC)    Ulcer of esophagus with bleeding     Past Surgical History:  Procedure Laterality Date   ABDOMINAL SURGERY     APPENDECTOMY     BACK SURGERY     COLON SURGERY     CREATION / REVISION OF ILEOSTOMY / JEJUNOSTOMY  05/2015   CYSTOSCOPY WITH RETROGRADE PYELOGRAM, URETEROSCOPY AND STENT PLACEMENT  03/2014   ESOPHAGOGASTRODUODENOSCOPY (EGD) WITH PROPOFOL  N/A 06/03/2016   Procedure: ESOPHAGOGASTRODUODENOSCOPY (EGD) WITH PROPOFOL ;  Surgeon: Marnee Sink, MD;  Location: [facility] ENDOSCOPY;  Service: Endoscopy;  Laterality: N/A;   ESOPHAGOGASTRODUODENOSCOPY (EGD) WITH PROPOFOL  N/A 04/27/2019   Procedure: ESOPHAGOGASTRODUODENOSCOPY (EGD) WITH PROPOFOL ;  Surgeon: Luke Salaam, MD;  Location: [facility] ENDOSCOPY;  Service: Gastroenterology;  Laterality: N/A;   ilieostomy     IRRIGATION AND DEBRIDEMENT OF NECROTIZING SOFT TISSUE INFECTION Bilateral 12/23/2023   Procedure: IRRIGATION AND DEBRIDEMENT OF NECROTIZING SOFT  TISSUE INFECTION;  Surgeon: Edmon Gosling, MD;  Location: [facility] OR;  Service: General;  Laterality: Bilateral;  prone   PERCUTANEOUS NEPHROSTOLITHOTOMY     PERCUTANEOUS NEPHROSTOMY     VENTRICULOPERITONEAL SHUNT     vp shunt  removal      Family History: Family History  Problem Relation Age of Onset   Diabetes Mellitus II Father    Bladder Cancer Neg Hx    Kidney cancer Neg Hx    Prostate cancer Neg Hx     Social History  reports that he has never smoked. He has never used smokeless tobacco. He reports that he does not drink alcohol and does not use drugs.  Allergies  Allergen Reactions   Morphine And Codeine Nausea And Vomiting   Metrizamide Hives   Sulfa Antibiotics Other (See Comments)    Reaction: unknown   Ivp Dye [Iodinated Contrast Media] Hives   Latex Rash    Medications   Current Facility-Administered Medications:    acetaminophen  (TYLENOL ) tablet 650 mg, 650 mg, Oral, Q6H PRN **OR** acetaminophen  (TYLENOL ) suppository 650 mg, 650 mg, Rectal, Q6H PRN, Sundil, Subrina, MD   carbamide peroxide (DEBROX) 6.5 % OTIC (EAR) solution 5 drop, 5 drop, Right EAR, TID, Gonfa, Taye T, MD, 5 drop at 02/06/24 1028   Chlorhexidine  Gluconate Cloth 2 % PADS 6 each, 6 each, Topical, Daily, Gonfa, Taye T, MD, 6 each at 02/06/24 0834   dextrose  5 % and 0.45 % NaCl with KCl 10 mEq/L infusion, , Intravenous, Continuous, Gonfa, Taye T, MD, Stopped at 02/06/24 1126   enoxaparin  (LOVENOX ) injection 30 mg, 30 mg, Subcutaneous, Q24H, Gonfa, Taye T, MD, 30 mg at 02/05/24 1556   ertapenem (INVANZ) 1 g in sodium chloride  0.9 % 100 mL IVPB, 1 g, Intravenous, Daily, Gonfa, Taye T, MD, Stopped at 02/05/24 1245   linezolid  (ZYVOX ) IVPB 600 mg, 600 mg, Intravenous, Q12H, Gonfa, Taye T, MD, Last Rate: 300 mL/hr at 02/06/24 1027, 600 mg at 02/06/24 1027   midodrine  (PROAMATINE ) tablet 15 mg, 15 mg, Oral, TID WC, Hall, Carole N, DO, 15 mg at 02/04/24 1228   prochlorperazine  (COMPAZINE ) injection 10 mg, 10 mg, Intravenous, Q6H PRN, Sundil, Subrina, MD   QUEtiapine  (SEROQUEL ) tablet 25 mg, 25 mg, Oral, QHS, Gonfa, Taye T, MD, 25 mg at 02/03/24 2236   rosuvastatin  (CRESTOR ) tablet 10 mg, 10 mg, Oral, QHS, Sundil, Subrina, MD, 10 mg  at 02/03/24 2233   sodium bicarbonate  tablet 1,300 mg, 1,300 mg, Oral, TID, Gonfa, Taye T, MD, 1,300 mg at 02/04/24 0817   sodium chloride  flush (NS) 0.9 % injection 10-40 mL, 10-40 mL, Intracatheter, PRN, Gonfa, Taye T, MD   sodium chloride  flush (NS) 0.9 % injection 3 mL, 3 mL, Intravenous, Q12H, Sundil, Subrina, MD, 3 mL at 02/06/24 0834   sodium chloride  flush (NS) 0.9 % injection 3 mL, 3 mL, Intravenous, PRN, Sundil, Subrina, MD   sucralfate  (CARAFATE ) tablet 1 g, 1 g, Oral, TID, Sundil, Subrina, MD, 1 g at 02/04/24 0817   thiamine  (VITAMIN B1) 500 mg in sodium chloride  0.9 % 50 mL IVPB, 500 mg, Intravenous, Q8H, Last Rate: 110 mL/hr at 02/06/24 0740, 500 mg at 02/06/24 0740 **FOLLOWED BY** [START ON 02/07/2024] thiamine  (VITAMIN B1) 250 mg in sodium chloride  0.9 % 50 mL IVPB, 250 mg, Intravenous, Q8H **FOLLOWED BY** [START ON 02/09/2024] thiamine  (VITAMIN B1) tablet 100 mg, 100 mg, Oral, Daily, Gonfa, Taye T, MD  Vitals   Vitals:   02/05/24 1554 02/05/24 1900 02/06/24  0446 02/06/24 1000  BP: 103/73 (!) 93/58 (!) 84/48 (!) 93/54  Pulse: 86 80 72 93  Resp: 15     Temp: (!) 97.5 F (36.4 C) 98.7 F (37.1 C) (!) 97.5 F (36.4 C) 98 F (36.7 C)  TempSrc: Oral Oral Axillary Axillary  SpO2: 100% 94% 100% 96%  Weight:      Height:        Body mass index is 24.56 kg/m.  Physical Exam   Constitutional: Appears ill appearing, laying in bed, arousable to voice, however appears drowsy and does not interact Psych: mildly agitated while EEG being placed.  Eyes: No scleral injection.  Appears to have cataract in his left eye HENT: No OP obstruction.  Head: Normocephalic.  Cardiovascular: Normal rate  Respiratory: Effort normal, non-labored breathing.  GI: Soft.  No distension. There is no tenderness.  Skin: WDI.   Neurologic Examination   Neurological Exam:  Mental status/Cognition:  Alert, arousable to voice and calling his name, disoriented to place, setting, and time.  Unable to  maintain attention. Pt has decreased concentration. Pt is unable to follow simple step commands.  Speech/language: Limited verbal expression during encounter only said "Hi" once in span of 2 encounters, predominantly grown/moans  Cranial nerves:   CN II Pupils equal and reactive to light, pt blinks to threat bilaterally.   CN III,IV,VI Unable to assess without patient's cooperation, left eye with slight deviation and cataract    CN V Unable to assess without patient's cooperation   CN VII no asymmetry, no nasolabial fold flattening   CN VIII Makes eye contact to speech.   CN IX & X Unable to assess without patient's cooperation   CN XI Unable to assess without patient's cooperation   CN XII Unable to assess without patient's cooperation    Motor:  Patient unable to follow commands to adequately assess strength.  Patient able to spontaneously move upper extremities due to noxious stimuli   Sensation:  Light touch Unable to assess without patient's cooperation   Pin prick  Unable to assess without patient's cooperation   Temperature  Unable to assess without patient's cooperation   Vibration  Unable to assess without patient's cooperation  Proprioception  Unable to assess without patient's cooperation   Coordination/Complex Motor:  - Finger to Nose: Unable to assess without patient's cooperation - Heel to shin:  Unable to assess without patient's cooperation - Finger tap: Unable to assess without patient's cooperation - Gait: Deferred   Labs/Imaging/Neurodiagnostic studies   CBC:  Recent Labs  Lab 02/10/2024 0010 02/10/2024 0405 02/05/24 0237 02/06/24 0251  WBC 14.4*   < > 12.3* 8.8  NEUTROABS 11.8*  --   --   --   HGB 8.3*   < > 11.3* 10.5*  HCT 28.3*   < > 37.9* 35.4*  MCV 77.3*   < > 85.2 86.6  PLT 566*   < > 539* 466*   < > = values in this interval not displayed.   Basic Metabolic Panel:  Lab Results  Component Value Date   NA 140 02/06/2024   K 3.9 02/06/2024    CO2 14 (L) 02/06/2024   GLUCOSE 175 (H) 02/06/2024   BUN 17 02/06/2024   CREATININE 1.52 (H) 02/06/2024   CALCIUM  7.9 (L) 02/06/2024   GFRNONAA 53 (L) 02/06/2024   GFRAA >60 06/30/2020   Lipid Panel: No results found for: "LDLCALC" HgbA1c:  Lab Results  Component Value Date   HGBA1C 6.3 (H) 12/24/2023  Urine Drug Screen: No results found for: "LABOPIA", "COCAINSCRNUR", "LABBENZ", "AMPHETMU", "THCU", "LABBARB"  Alcohol Level No results found for: "ETH" INR  Lab Results  Component Value Date   INR 1.2 12/23/2023   APTT  Lab Results  Component Value Date   APTT 39 (H) 12/23/2023   AED levels: No results found for: "PHENYTOIN", "ZONISAMIDE", "LAMOTRIGINE", "LEVETIRACETA"  CT Head without contrast(Personally reviewed): 02/04/24  1. No acute intracranial abnormality. 2. Marked ventricular enlargement bilaterally with chronic thinning of the cerebral white matter. This likely reflects chronic hydrocephalus. 3. The cerebellar tonsils herniate below the foramen magnum. Extend to at least the level of C1. The inferior extent is not visualized. Findings are consistent with a Chiari malformation. 4. Left middle ear and mastoid effusion. No obstructing nasopharyngeal lesion is present. 5. Calcified left globe.  MRI Brain Pending   Neurodiagnostics rEEG:  Pending  ASSESSMENT   Dreydan Sotto is a 58 y.o. male with a past medical history of spina bifida status post urostomy/colostomy/neurogenic bladder, chronic sacral decubitus ulcer, chronic osteomyelitis of the ischial tuberosity/inferior pubic ramus, microcytic anemia, prediabetic, essential hypertension, CKD stage II and chronic wheelchair-bound presented to emergency department complaining of generalized weakness for 3 days with associated worsening sacral wound on 4/30.   Neurology was consulted in the setting of encephalopathy.  Given the patient's chronic history of spina bifida and chronic infectious requiring  antibiotics. At the current moment suspect that his acute waxing and waning of mentation status could be in the setting of delirium.  Suspect he is at increased risk for delirium given his chronic hydrocephalus noted on CT this admission.  Patient was also treated on cefepime  during this hospitalization and could also be contributing to his altered mental status. Although suspect this is delirium, we continue to rule out other etiologies such as stroke, electrolyte derangements, vitamin deficiencies or seizure.  Overall labs seem to be improved in comparison to admission.  Patient without leukocytosis, afebrile or clinical signs of new infection. We will order further imaging such as an MRI and routine EEG to rule out other possible neurological causes.  RECOMMENDATIONS  - Delirium Precautions ordered  - EEG ordered  - MRI pending, patient may require some sedation to tolerate imaging, can consider Ativan prior - Continue to monitor electrolytes, CBC periodically to rule out other contributing factors to altered mental status -Folate ordered -Would NOT recommend  LP, given the patient's chronic hydrocephalus noted on CT - Patient unable to participate in speech path evaluation earlier, will need to continue to reassess during admission -Agree with discontinuing cefepime , given increased risk of altered mental status - Continue antibiotics per primary/ID  Neurology will continue to follow at this time ______________________________________________________________________  Signed, Brayton Calin, MD [facility] - PGY1  NEUROHOSPITALIST ADDENDUM Performed a face to face diagnostic evaluation.   I have reviewed the contents of history and physical exam as documented by PA/ARNP/Resident and agree with above documentation.  I have discussed and formulated the above plan as documented. Edits to the note have been made as needed.  Impression/Key exam findings/Plan: suspect current  encephalopathy, moaning and poor attention are likely secondary to delirium. Was up all night per sister yesterday. Times where he is improved. Clinically, no focal deficit noted. He is moaning, looks around but does not seem to make eye contact. Moves BL upper extremities antigravity, has enucleation of left eye, dysconjugate gaze. Labs with no obvious metabolic abnormalities. Ammonia, RPR, B12 non revealing. Slightly decreased TSH with normal  T4. B1 levels are pending, continue thiamine . He was briefly on Cefepime  but was switched to alternative antibiotic 2 days ago and so I dont think that is particularly contributing.  Plan is to get a MRI Brain and a routine EEG. Dont think LP is possible given chronic hydrocephalus with notable significant downward cerebellar tonsillar herniation. No obvious nuchal rigidity, he is spontaneously moving his head left and right.  Plan discussed with sister at the bedside and with Dr. Gonfa with the hospitalist team over secure chat.  Kutter Schnepf, MD [facility] [phone]   If 7pm to 7am, please call on call as listed on AMION.

## 2024-02-06 NOTE — Progress Notes (Signed)
 LTM EEG hooked up and running - no initial skin breakdown - push button tested - [facility] monitoring.

## 2024-02-06 NOTE — Progress Notes (Signed)
 Speech Language Pathology Treatment: Dysphagia  Patient Details Name: [name] MRN: [id number] DOB: [birth year] Today's Date: 02/06/2024 Time: 7829-5621 SLP Time Calculation (min) (ACUTE ONLY): 16 min  Assessment / Plan / Recommendation Clinical Impression  Pt seen for f/u dysphagia tx with max verbal/tactile cues required with pt moaning/min agitated and sister present for tx session.  Limited oral care provided with pt able to follow 1-step commands, although inconsistent throughout session.  Pt did consume min amount of puree and thin via tsp with delayed swallow response and oral holding/oral residue noted with puree and anterior loss on R of thin/puree observed with pt becoming agitated when SLP attempted to remove from R facial area.  Sister A with removal and po trials halted.  Recommend continue NPO status as pt remains AMS and cognitive-based dysphagia symptoms with limited intake.  ST will continue efforts to assess swallow function during acute stay and determine objective testing need and/or initiate a conservative diet as pt able.    HPI HPI: [name] is a 58 year old M who presents with generalized weakness. Workup in ED concerning for acute on chronic osteomyelitis, acute on chronic anemia, AKI and hypokalemia. Head CT 5/2 with no acute findings. No chest imaging. Pt with PMH of spina bifid/neurogenic bladder with urostomy and colostomy, chronic sacral decubitus, chronic osteomyelitis of ischial tuberosity, inferior pubic ramus, HTN, anemia and recent hospitalization from 3/21-3/28 for septic shock in the setting of necrotizing soft tissue infection and sacral decubitus. ST f/u for po readiness after CSE revealed need for NPO status d/t pt refusal/limited arousal.      SLP Plan  Continue with current plan of care      Recommendations for follow up therapy are one component of a multi-disciplinary discharge planning process, led by the attending physician.   Recommendations may be updated based on patient status, additional functional criteria and insurance authorization.    Recommendations  Diet recommendations: NPO Medication Administration: Via alternative means                  Oral care QID     Dysphagia, unspecified (R13.10)     Continue with current plan of care     Pat [name],M.S.,CCC-SLP  02/06/2024, 11:09 AM

## 2024-02-06 NOTE — Procedures (Signed)
 Patient Name: [name]  MRN: [id number]  Epilepsy Attending: [name]  Referring Physician/Provider: Brayton Calin, MD  Date: 02/06/2024 Duration: 24.36 mins  Patient history: 58yo M with ams. EEG to evaluate for seizure  Level of alertness: Awake/ lethargic   AEDs during EEG study: None  Technical aspects: This EEG study was done with scalp electrodes positioned according to the 10-20 International system of electrode placement. Electrical activity was reviewed with band pass filter of 1-70Hz , sensitivity of 7 uV/mm, display speed of 32mm/sec with a 60Hz  notched filter applied as appropriate. EEG data were recorded continuously and digitally stored.  Video monitoring was available and reviewed as appropriate.  Description: EEG showed continuous generalized polymorphic high amplitude 5 to 6 Hz theta slowing admixed with intermittent 2-3hz  delta slowing. Hyperventilation and photic stimulation were not performed.     ABNORMALITY - Continuous slow, generalized  IMPRESSION: This study is suggestive of moderate diffuse encephalopathy. No seizures or epileptiform discharges were seen throughout the recording.  Denece Shearer O Kael Keetch

## 2024-02-06 NOTE — Progress Notes (Signed)
 Patient's b/p is 84/48 (MAP 59). Unable to do manual because he becomes agitated. Otherwise, he is now unable to speak coherently. Waiting on MRI of brain to be done. He is receiving D5 1/2 NS with KCL @ 75. Dr. Del Favia made aware.  Orders received and implemented.  Will continue to monitor patient.  Necia Bali RN

## 2024-02-06 NOTE — Progress Notes (Signed)
 Patient had an electrographic and clinical seizure lasting for approximately 3 minutes and noted on EEG.  He received a single dose of Keppra earlier, I will start maintenance Keppra with 1 g twice daily, first dose now.  Ann Keto, MD [facility]   If 7pm- 7am, please page neurology on call as listed in AMION.

## 2024-02-06 NOTE — TOC Progression Note (Addendum)
 Transition of Care [facility]) - Progression Note    Patient Details  Name: [name] MRN: [id number] Date of Birth: 1965/12/16  Transition of Care [facility]) CM/SW Contact  Tom-Johnson, Riely Oetken Daphne, RN Phone Number: 02/06/2024, 4:13 PM  Clinical Narrative:     Patient continues with Ertapenem and Linezolid  until 02/17/24 then will transition to Augmentin  for 4 weeks per ID. Has a PICC line placed on 02/03/24. Woundcare continues.  EEG for Seizures, Neurology following.   Home health will be secured when Medically ready.  CM will continue to follow as patient progresses with care towards discharge.              Expected Discharge Plan and Services                                               Social Determinants of Health (SDOH) Interventions SDOH Screenings   Food Insecurity: No Food Insecurity (02/01/2024)  Housing: Low Risk  (02/01/2024)  Transportation Needs: No Transportation Needs (02/01/2024)  Utilities: Not At Risk (02/01/2024)  Depression (PHQ2-9): Low Risk  (12/09/2021)  Financial Resource Strain: Low Risk  (07/29/2023)   Received from [facility]  Tobacco Use: Low Risk  (02/01/2024)    Readmission Risk Interventions    02/03/2024    3:49 PM 08/10/2023    3:37 PM  Readmission Risk Prevention Plan  Transportation Screening Complete Complete  PCP or Specialist Appt within 3-5 Days Complete Complete  HRI or Home Care Consult Complete   Social Work Consult for Recovery Care Planning/Counseling Complete Complete  Palliative Care Screening Not Applicable Not Applicable  Medication Review Oceanographer) Referral to Pharmacy Complete

## 2024-02-06 NOTE — Progress Notes (Signed)
 PROGRESS NOTE  [name] ONG:[id number] DOB: March 07, 1966   PCP: [name], MD  Patient is from: Home.  Lives with sister.  Wheelchair-bound at baseline.  DOA: 01/31/2024 LOS: 5  Chief complaints Chief Complaint  Patient presents with   Fatigue   Weakness   Wound Check     Brief Narrative / Interim history: 58 year old M with PMH of spina bifid/neurogenic bladder with urostomy and colostomy, chronic sacral decubitus, chronic osteomyelitis of ischial tuberosity, inferior pubic ramus, HTN, anemia and recent hospitalization from 3/21-3/28 for septic shock in the setting of necrotizing soft tissue infection and sacral decubitus for which she was treated with vasopressor, broad-spectrum antibiotics and discharged on p.o. Augmentin  for 14 weeks per recommendation by ID.    Patient presents with generalized weakness.  Workup in ED concerning for acute on chronic osteomyelitis, acute on chronic anemia, AKI and hypokalemia.  He had mild leukocytosis to 14.4.  Hgb dropped from 8.3 (baseline) to 5.9 and 5.5 on repeat.  Cr 1.98 (was 1.0 on 3/27).  K2.2.  CT abdomen and pelvis concerning bilateral deep gluteal ulcers with acute on chronic pelvic osteomyelitis and progressive left hip septic joint.  Blood cultures obtained.  Started on broad-spectrum antibiotics and potassium supplementation.  2 units of blood ordered.  Orthopedic surgery consulted  The next day, transfused 2 units and hemoglobin improved to 11.5.  Evaluated by orthopedic surgery who did not feel there is need for surgical intervention.  ID initially recommended cefepime , oral Zyvox  and Flagyl  until 5/16 followed by Augmentin  for 4 weeks.  Hospital course complicated by effusion/delirium.  Cefepime  changed to ertapenem.  Flagyl  discontinued.  EEG with moderate diffuse encephalopathy but no seizure or epileptiform discharge.  Ammonia, TSH and B12 within normal.  CT head with hydrocephalus and Chiari malformation  features unchanged from prior CT in 2009.  MRI brain ordered and pending.  Neurology consulted.  SNF hospital stay for wound care.  TOC consulted for placement when medically stable  Subjective: Seen and examined earlier this morning.  Hypotensive to 84/48 overnight but improved after IV albumin  and small bolus.  Sleepy but wakes to voice.  Hard of hearing.  Not interacting other than saying "ouch" to noxious stimuli.  Patient sister at bedside.  Objective: Vitals:   02/05/24 1554 02/05/24 1900 02/06/24 0446 02/06/24 1000  BP: 103/73 (!) 93/58 (!) 84/48 (!) 93/54  Pulse: 86 80 72 93  Resp: 15     Temp: (!) 97.5 F (36.4 C) 98.7 F (37.1 C) (!) 97.5 F (36.4 C) 98 F (36.7 C)  TempSrc: Oral Oral Axillary Axillary  SpO2: 100% 94% 100% 96%  Weight:      Height:        Examination:  GENERAL: No apparent distress.  Nontoxic. HEENT: MMM.  Right eye appears normal.  Corneal cloudiness left eye (chronic).  Hard of hearing? Earwax in right ear.  Left ear clear. NECK: Supple.  No apparent JVD.  RESP:  No IWOB.  Fair aeration bilaterally. CVS:  RRR. Heart sounds normal.  ABD/GI/GU: BS+. Abd soft.  RLQ urostomy.  LLQ colostomy. MSK/EXT: BLE deformity and atrophy. SKIN: Unstageable sacral decubitus.  See picture under media. NEURO: Awake and alert.  Oriented to self, place, month and year.  No apparent focal neuro deficit. PSYCH: Calm. Normal affect.   Consultants:  Orthopedic surgery Infectious disease  Procedures: None  Microbiology summarized: Blood cultures NGTD  Assessment and plan: Sepsis secondary to acute on chronic osteomyelitis of the ischial  tuberosity/inferior pubic ramus Left hip septic joint Spina bifida with sacral decubitus ulcer stage IV -Recently hospitalized for septic shock and discharged on Augmentin  for 14 weeks -CT-bilateral deep gluteal ulcers with acute on chronic pelvic osteo and progressive left hip septic joint.  . -CRP 25.  ESR 140.  Blood cultures  NGTD. -Per Ortho, no good surgical remedy -IR consulted for drainage of left hip but recommended treating based on positive culture on 3/21 -Ertapenem and linezolid  until 5/16 followed by 4 weeks of Augmentin  per ID. -SNF hospital stay for wound care. -PICC line placed on 5/2. -Wound care -RD consulted for optimize nutrition  Acute encephalopathy/delirium: Patient is awake but not interacting.  Seems to have diminished hearing as well.  Has been on cefepime  for osteomyelitis.  Not on sedating medication other than low-dose Seroquel .  Ammonia, TSH and B12 normal.  CT with hydrocephalus and Chiari malformation (both chronic).  EEG with moderate diffuse encephalopathy but no seizure. -Change cefepime  to ertapenem -Trial of high-dose thiamine  started on 5/4 -Correct electrolytes and hydrate -Reorientation and delirium precaution -Follow MRI brain -Appreciate help by neurology.  Oropharyngeal dysphagia in the setting of encephalopathy - Change essential meds to IV - N.p.o. per SLP.  Monitor CBG while NPO - Change fluids to sodium bicarbonate   Acute on chronic anemia: Hgb dropped from 8.3-5.5.  Likely dilutional from IV fluid resuscitation.  No overt bleeding.  Improved to 11.6 after 2 units. Recent Labs    12/28/23 0505 02/01/24 0010 02/01/24 0405 02/01/24 0439 02/01/24 1942 02/02/24 0535 02/03/24 0610 02/04/24 0925 02/05/24 0237 02/06/24 0251  HGB 8.4* 8.3* 5.9* 5.5* 11.6* 11.8* 11.7* 11.5* 11.3* 10.5*  -Continue monitoring  AKI: B/l Cr ~1.0.  Likely hypovolemic.  Not on nephrotoxic meds.  Recent Labs    12/28/23 0103 12/29/23 0400 02/01/24 0010 02/01/24 0405 02/01/24 1942 02/02/24 0552 02/03/24 0610 02/04/24 0925 02/05/24 0237 02/06/24 0251  BUN 14 11 32* 27* 22* 17 12 14 17 17   CREATININE 1.21 1.07 1.98* 1.78* 1.52* 1.39* 1.38* 1.40* 1.59* 1.52*  -IV fluid as above -Avoid nephrotoxic meds. -Continue midodrine  for blood pressure support when able to take  p.o. -Closely monitor urine output   Chronic hypotension: On midodrine  at home.  Cortisol within normal.  TSH slightly low but free T4 normal.  Normal TTE in 2017. -Midodrine  when able to take p.o. -IV fluid and IV albumin  as needed -Discontinued Flomax .  Diminished hearing/earwax in right ear: Left mastoid/middle ear effusion on CT. -Antibiotics as above -Debrox 3 times daily -Follow MRI brain   Prolonged QTc: QTc 599 in the setting of severe hypokalemia.  Improved to 470. - Avoid or minimize QT prolonging drugs - Continue telemetry - Optimize electrolytes  Hypokalemia/hypomagnesemia/hypophosphatemia:  - Monitor replenish as appropriate   Hyponatremia: Resolved. -Continue monitoring  Hypernatremia: Resolved.   Ventriculomegaly/Chiari malformation-chronic. History of spina bifida with a stage IV ulcer, neurogenic bladder, urostomy and colostomy -Continue wound and ostomy care -Discontinue Flomax .  Does not help with neurogenic bladder   Non-anion gap metabolic acidosis: Likely due to renal failure.  -Manage renal failure as above -IV sodium bicarbonate   Nutrition/dysphagia Body mass index is 24.56 kg/m. - Reconsult dietitian when he starts taking p.o.  Nutrition Problem: Increased nutrient needs Etiology: wound healing Signs/Symptoms: estimated needs Interventions: MVI, Juven Unstageable pressure skin injury of bilateral buttocks: Present on arrival Pressure Injury 12/24/23 Buttocks Right (Active)  12/24/23 0100  Location: Buttocks  Location Orientation: Right  Staging:   Wound Description (Comments):  Present on Admission: Yes     Pressure Injury 12/24/23 Buttocks Left (Active)  12/24/23 0100  Location: Buttocks  Location Orientation: Left  Staging:   Wound Description (Comments):   Present on Admission: Yes   DVT prophylaxis:  enoxaparin  (LOVENOX ) injection 30 mg Start: 02/05/24 1530  Code Status: Full code Family Communication: Updated patient  sister at bedside. Level of care: Telemetry Medical Status is: Inpatient Remains inpatient appropriate because: Acute on chronic osteomyelitis, anemia, AKI and electrolyte derangements   Final disposition: SNF   55 minutes with more than 50% spent in reviewing records, counseling patient/family and coordinating care.   Sch Meds:  Scheduled Meds:  carbamide peroxide  5 drop Right EAR TID   Chlorhexidine  Gluconate Cloth  6 each Topical Daily   enoxaparin  (LOVENOX ) injection  30 mg Subcutaneous Q24H   midodrine   15 mg Oral TID WC   QUEtiapine   25 mg Oral QHS   rosuvastatin   10 mg Oral QHS   sodium bicarbonate   1,300 mg Oral TID   sodium chloride  flush  3 mL Intravenous Q12H   sucralfate   1 g Oral TID   [START ON 02/09/2024] thiamine   100 mg Oral Daily   Continuous Infusions:  ertapenem 1 g (02/06/24 1139)   linezolid  (ZYVOX ) IV 600 mg (02/06/24 1027)   thiamine  (VITAMIN B1) injection 500 mg (02/06/24 0740)   Followed by   Cecily Cohen ON 02/07/2024] thiamine  (VITAMIN B1) injection     PRN Meds:.acetaminophen  **OR** acetaminophen , prochlorperazine , sodium chloride  flush, sodium chloride  flush  Antimicrobials: Anti-infectives (From admission, onward)    Start     Dose/Rate Route Frequency Ordered Stop   02/05/24 1045  linezolid  (ZYVOX ) IVPB 600 mg        600 mg 300 mL/hr over 60 Minutes Intravenous Every 12 hours 02/05/24 0945 02/17/24 0959   02/05/24 0045  linezolid  (ZYVOX ) IVPB 600 mg        600 mg 300 mL/hr over 60 Minutes Intravenous  Once 02/04/24 2349 02/05/24 0933   02/04/24 1700  ertapenem (INVANZ) 1 g in sodium chloride  0.9 % 100 mL IVPB        1 g 200 mL/hr over 30 Minutes Intravenous Daily 02/04/24 1600 02/17/24 0959   02/02/24 2200  ceFEPIme  (MAXIPIME ) 2 g in sodium chloride  0.9 % 100 mL IVPB  Status:  Discontinued        2 g 200 mL/hr over 30 Minutes Intravenous Every 12 hours 02/02/24 1600 02/04/24 1600   02/02/24 2200  metroNIDAZOLE  (FLAGYL ) tablet 500 mg  Status:   Discontinued        500 mg Oral Every 12 hours 02/02/24 1600 02/04/24 1600   02/02/24 2200  linezolid  (ZYVOX ) tablet 600 mg  Status:  Discontinued        600 mg Oral Every 12 hours 02/02/24 1600 02/05/24 0945   02/01/24 1815  DAPTOmycin  (CUBICIN ) 350 mg in sodium chloride  0.9 % IVPB  Status:  Discontinued        8 mg/kg  46 kg 114 mL/hr over 30 Minutes Intravenous Daily 02/01/24 1719 02/02/24 1600   02/01/24 0700  piperacillin -tazobactam (ZOSYN ) IVPB 3.375 g  Status:  Discontinued        3.375 g 12.5 mL/hr over 240 Minutes Intravenous Every 8 hours 02/01/24 0254 02/02/24 1600   02/01/24 0348  vancomycin  variable dose per unstable renal function (pharmacist dosing)  Status:  Discontinued         Does not apply See admin instructions 02/01/24 0348 02/01/24  1657   02/01/24 0015  vancomycin  (VANCOCIN ) IVPB 1000 mg/200 mL premix        1,000 mg 200 mL/hr over 60 Minutes Intravenous  Once 02/01/24 0010 02/01/24 0242   02/01/24 0015  piperacillin -tazobactam (ZOSYN ) IVPB 3.375 g        3.375 g 100 mL/hr over 30 Minutes Intravenous  Once 02/01/24 0010 02/01/24 0148        I have personally reviewed the following labs and images: CBC: Recent Labs  Lab 02/01/24 0010 02/01/24 0405 02/02/24 0535 02/03/24 0610 02/04/24 0925 02/05/24 0237 02/06/24 0251  WBC 14.4*   < > 8.4 11.8* 11.0* 12.3* 8.8  NEUTROABS 11.8*  --   --   --   --   --   --   HGB 8.3*   < > 11.8* 11.7* 11.5* 11.3* 10.5*  HCT 28.3*   < > 37.1* 38.0* 37.5* 37.9* 35.4*  MCV 77.3*   < > 81.2 83.2 83.7 85.2 86.6  PLT 566*   < > 512* 540* 513* 539* 466*   < > = values in this interval not displayed.   BMP &GFR Recent Labs  Lab 02/01/24 0403 02/01/24 0405 02/02/24 0552 02/03/24 0610 02/04/24 0925 02/05/24 0237 02/06/24 0251  NA  --    < > 142 144 146* 144 140  K  --    < > 3.4* 3.8 2.8* 2.9* 3.9  CL  --    < > 117* 124* 119* 121* 121*  CO2  --    < > 14* 11* 15* 12* 14*  GLUCOSE  --    < > 95 82 76 164* 175*  BUN   --    < > 17 12 14 17 17   CREATININE  --    < > 1.39* 1.38* 1.40* 1.59* 1.52*  CALCIUM   --    < > 8.0* 8.1* 8.2* 8.1* 7.9*  MG 2.0  --   --  1.8 1.6* 2.7* 2.2  PHOS  --    < > 2.5 2.4* 2.1* 2.7 2.1*   < > = values in this interval not displayed.   Estimated Creatinine Clearance: 30.9 mL/min (A) (by C-G formula based on SCr of 1.52 mg/dL (H)). Liver & Pancreas: Recent Labs  Lab 02/01/24 0010 02/01/24 0405 02/01/24 1942 02/02/24 0552 02/03/24 0610 02/04/24 0925 02/05/24 0237 02/06/24 0251  AST 27 22  --   --   --   --   --  16  ALT 18 12  --   --   --   --   --  12  ALKPHOS 127* 84  --   --   --   --   --  64  BILITOT 0.6 0.9  --   --   --   --   --  0.3  PROT 6.6 5.4*  --   --   --   --   --  5.0*  ALBUMIN  <1.5* 1.7*   < > 2.2* 1.9* 1.6* <1.5* <1.5*   < > = values in this interval not displayed.   No results for input(s): "LIPASE", "AMYLASE" in the last 168 hours. Recent Labs  Lab 02/05/24 0320  AMMONIA 23   Diabetic: No results for input(s): "HGBA1C" in the last 72 hours. No results for input(s): "GLUCAP" in the last 168 hours. Cardiac Enzymes: Recent Labs  Lab 02/02/24 0552  CKTOTAL 27*   No results for input(s): "PROBNP" in the last 8760 hours. Coagulation Profile:  No results for input(s): "INR", "PROTIME" in the last 168 hours. Thyroid  Function Tests: No results for input(s): "TSH", "T4TOTAL", "FREET4", "T3FREE", "THYROIDAB" in the last 72 hours.  Lipid Profile: No results for input(s): "CHOL", "HDL", "LDLCALC", "TRIG", "CHOLHDL", "LDLDIRECT" in the last 72 hours. Anemia Panel: Recent Labs    02/05/24 0237  VITAMINB12 568   Urine analysis:    Component Value Date/Time   COLORURINE AMBER (A) 01/31/2024 0000   APPEARANCEUR CLOUDY (A) 01/31/2024 0000   APPEARANCEUR Cloudy (A) 03/21/2019 1130   LABSPEC 1.008 01/31/2024 0000   PHURINE 8.0 01/31/2024 0000   GLUCOSEU NEGATIVE 01/31/2024 0000   HGBUR SMALL (A) 01/31/2024 0000   BILIRUBINUR NEGATIVE  01/31/2024 0000   BILIRUBINUR Negative 03/21/2019 1130   KETONESUR NEGATIVE 01/31/2024 0000   PROTEINUR 100 (A) 01/31/2024 0000   NITRITE NEGATIVE 01/31/2024 0000   LEUKOCYTESUR LARGE (A) 01/31/2024 0000   Sepsis Labs: Invalid input(s): "PROCALCITONIN", "LACTICIDVEN"  Microbiology: Recent Results (from the past 240 hours)  Blood culture (routine x 2)     Status: None   Collection Time: 02/01/24 12:04 AM   Specimen: BLOOD  Result Value Ref Range Status   Specimen Description BLOOD SITE NOT SPECIFIED  Final   Special Requests   Final    BOTTLES DRAWN AEROBIC AND ANAEROBIC Blood Culture results may not be optimal due to an inadequate volume of blood received in culture bottles   Culture   Final    NO GROWTH 5 DAYS Performed at [facility], [address], [place], [place] [address]    Report Status 02/06/2024 FINAL  Final  Culture, blood (Routine X 2) w Reflex to ID Panel     Status: None   Collection Time: 02/01/24 12:36 AM   Specimen: BLOOD  Result Value Ref Range Status   Specimen Description BLOOD SITE NOT SPECIFIED  Final   Special Requests   Final    BOTTLES DRAWN AEROBIC AND ANAEROBIC Blood Culture results may not be optimal due to an inadequate volume of blood received in culture bottles   Culture   Final    NO GROWTH 5 DAYS Performed at [facility], [address], [place], [place] [address]    Report Status 02/06/2024 FINAL  Final  MRSA Next Gen by PCR, Nasal     Status: None   Collection Time: 02/01/24  2:44 PM   Specimen: Nasal Mucosa; Nasal Swab  Result Value Ref Range Status   MRSA by PCR Next Gen NOT DETECTED NOT DETECTED Final    Comment: (NOTE) The GeneXpert MRSA Assay (FDA approved for NASAL specimens only), is one component of a comprehensive MRSA colonization surveillance program. It is not intended to diagnose MRSA infection nor to guide or monitor treatment for MRSA infections. Test performance is not FDA approved in patients less than 53  years old. Performed at [facility], [address] [place], [place] [address]     Radiology Studies: EEG adult Result Date: 02/06/2024 [name], MD     02/06/2024  2:04 PM Patient Name: Joshawa Lapietra MRN: [id number] Epilepsy Attending: [name] Referring Physician/Provider: Brayton Calin, MD Date: 02/06/2024 Duration: 24.36 mins Patient history: 58yo M with ams. EEG to evaluate for seizure Level of alertness: Awake/ lethargic AEDs during EEG study: None Technical aspects: This EEG study was done with scalp electrodes positioned according to the 10-20 International system of electrode placement. Electrical activity was reviewed with band pass filter of 1-70Hz ,  sensitivity of 7 uV/mm, display speed of 31mm/sec with a 60Hz  notched filter applied as appropriate. EEG data were recorded continuously and digitally stored.  Video monitoring was available and reviewed as appropriate. Description: EEG showed continuous generalized polymorphic high amplitude 5 to 6 Hz theta slowing admixed with intermittent 2-3hz  delta slowing. Hyperventilation and photic stimulation were not performed.   ABNORMALITY - Continuous slow, generalized IMPRESSION: This study is suggestive of moderate diffuse encephalopathy. No seizures or epileptiform discharges were seen throughout the recording. Priyanka O Yadav        Nealy Hickmon T. Cherie Lasalle [facility]  If 7PM-7AM, please contact night-coverage [email or web address] 02/06/2024, 2:05 PM

## 2024-02-06 NOTE — Progress Notes (Signed)
 Eeg complete, results are pending

## 2024-02-06 NOTE — Progress Notes (Signed)
 [facility]  Date of Admission:  01/31/2024     Reason for Follow Up: Acute osteomyelitis of sacrum (HCC)  Total days of antibiotics 7         ASSESSMENT:  [name] has developed increased altered mental status over the weekend in the setting of chronic pressure wounds and pelvic osteomyelitis.  Cefepime  has been changed to ertapenem in the event of cefepime  induced neurotoxicity contributing to his altered mental status.  MRI brain pending.  Discussed plan of care with his sister given his altered mental status to continue current dose of ertapenem and linezolid  for pelvic osteomyelitis with duration of [redacted] weeks along with continued wound care as indicated and then transition to 4 weeks of Augmentin .  Explained importance of side offloading and nutritional intake.  Has had previous flap procedure in the past and likely would be unable to heal this wound without additional flap procedure.  Standard/universal precautions.  Remaining medical and supportive care per internal medicine.    PLAN:  Continue current dose of ertapenem and linezolid  Await MRI results Site offloading and optimize nutrition as able Standard/universal precautions. Remaining medical and supportive care per internal medicine.  Principal Problem:   Acute on chronic osteomyelitis of sacrum (HCC) Active Problems:   Chronic hypokalemia   Essential hypertension   Acute kidney injury superimposed on chronic kidney disease (HCC)   Acute anemia   Sepsis (HCC)   Metabolic acidosis   History of spina bifida   Colostomy in place [facility])   History of urostomy   Sacral decubitus ulcer, stage IV (HCC)   Microcytic anemia   CKD (chronic kidney disease) stage 2, GFR 60-89 ml/min   Chronic hypotension   Generalized anxiety disorder    carbamide peroxide  5 drop Right EAR TID   Chlorhexidine  Gluconate Cloth  6 each Topical Daily   enoxaparin  (LOVENOX ) injection  30 mg Subcutaneous Q24H    midodrine   15 mg Oral TID WC   QUEtiapine   25 mg Oral QHS   rosuvastatin   10 mg Oral QHS   sodium bicarbonate   1,300 mg Oral TID   sodium chloride  flush  3 mL Intravenous Q12H   sucralfate   1 g Oral TID   [START ON 02/09/2024] thiamine   100 mg Oral Daily    SUBJECTIVE:  Afebrile overnight.  Altered mental status and confused.  Sister visiting at bedside.  Allergies  Allergen Reactions   Morphine And Codeine Nausea And Vomiting   Metrizamide Hives   Sulfa Antibiotics Other (See Comments)    Reaction: unknown   Ivp Dye [Iodinated Contrast Media] Hives   Latex Rash     Review of Systems: Review of Systems  Unable to perform ROS: Mental status change      OBJECTIVE: Vitals:   02/05/24 1554 02/05/24 1900 02/06/24 0446 02/06/24 1000  BP: 103/73 (!) 93/58 (!) 84/48 (!) 93/54  Pulse: 86 80 72 93  Resp: 15     Temp: (!) 97.5 F (36.4 C) 98.7 F (37.1 C) (!) 97.5 F (36.4 C) 98 F (36.7 C)  TempSrc: Oral Oral Axillary Axillary  SpO2: 100% 94% 100% 96%  Weight:      Height:       Body mass index is 24.56 kg/m.  Physical Exam Constitutional:      General: He is not in acute distress.    Appearance: He is well-developed.     Comments: Lying in bed with head of bed slightly elevated; lethargic  Cardiovascular:     Rate and Rhythm: Normal rate and regular rhythm.     Heart sounds: Normal heart sounds.  Pulmonary:     Effort: Pulmonary effort is normal.     Breath sounds: Normal breath sounds.  Skin:    General: Skin is warm and dry.  Neurological:     Mental Status: He is oriented to person, place, and time.     Lab Results Lab Results  Component Value Date   WBC 8.8 02/06/2024   HGB 10.5 (L) 02/06/2024   HCT 35.4 (L) 02/06/2024   MCV 86.6 02/06/2024   PLT 466 (H) 02/06/2024    Lab Results  Component Value Date   CREATININE 1.52 (H) 02/06/2024   BUN 17 02/06/2024   NA 140 02/06/2024   K 3.9 02/06/2024   CL 121 (H) 02/06/2024   CO2 14 (L) 02/06/2024     Lab Results  Component Value Date   ALT 12 02/06/2024   AST 16 02/06/2024   ALKPHOS 64 02/06/2024   BILITOT 0.3 02/06/2024     Microbiology: Recent Results (from the past 240 hours)  Blood culture (routine x 2)     Status: None   Collection Time: 02/01/24 12:04 AM   Specimen: BLOOD  Result Value Ref Range Status   Specimen Description BLOOD SITE NOT SPECIFIED  Final   Special Requests   Final    BOTTLES DRAWN AEROBIC AND ANAEROBIC Blood Culture results may not be optimal due to an inadequate volume of blood received in culture bottles   Culture   Final    NO GROWTH 5 DAYS Performed at [facility], [address] [place], [place] [address]    Report Status 02/06/2024 FINAL  Final  Culture, blood (Routine X 2) w Reflex to ID Panel     Status: None   Collection Time: 02/01/24 12:36 AM   Specimen: BLOOD  Result Value Ref Range Status   Specimen Description BLOOD SITE NOT SPECIFIED  Final   Special Requests   Final    BOTTLES DRAWN AEROBIC AND ANAEROBIC Blood Culture results may not be optimal due to an inadequate volume of blood received in culture bottles   Culture   Final    NO GROWTH 5 DAYS Performed at [facility], [address] [place], [place] [address]    Report Status 02/06/2024 FINAL  Final  MRSA Next Gen by PCR, Nasal     Status: None   Collection Time: 02/01/24  2:44 PM   Specimen: Nasal Mucosa; Nasal Swab  Result Value Ref Range Status   MRSA by PCR Next Gen NOT DETECTED NOT DETECTED Final    Comment: (NOTE) The GeneXpert MRSA Assay (FDA approved for NASAL specimens only), is one component of a comprehensive MRSA colonization surveillance program. It is not intended to diagnose MRSA infection nor to guide or monitor treatment for MRSA infections. Test performance is not FDA approved in patients less than 53 years old. Performed at [facility], [address], [place], [place] [address]      Marlan Silva, NP [facility]  02/06/2024  12:55 PM

## 2024-02-07 ENCOUNTER — Encounter (HOSPITAL_COMMUNITY)

## 2024-02-07 DIAGNOSIS — D649 Anemia, unspecified: Secondary | ICD-10-CM | POA: Diagnosis not present

## 2024-02-07 DIAGNOSIS — E872 Acidosis, unspecified: Secondary | ICD-10-CM | POA: Diagnosis not present

## 2024-02-07 DIAGNOSIS — R569 Unspecified convulsions: Secondary | ICD-10-CM | POA: Diagnosis not present

## 2024-02-07 DIAGNOSIS — N179 Acute kidney failure, unspecified: Secondary | ICD-10-CM | POA: Diagnosis not present

## 2024-02-07 DIAGNOSIS — M4628 Osteomyelitis of vertebra, sacral and sacrococcygeal region: Secondary | ICD-10-CM | POA: Diagnosis not present

## 2024-02-07 LAB — CBC
HCT: 34.1 % — ABNORMAL LOW (ref 39.0–52.0)
Hemoglobin: 10 g/dL — ABNORMAL LOW (ref 13.0–17.0)
MCH: 25.3 pg — ABNORMAL LOW (ref 26.0–34.0)
MCHC: 29.3 g/dL — ABNORMAL LOW (ref 30.0–36.0)
MCV: 86.3 fL (ref 80.0–100.0)
Platelets: 433 10*3/uL — ABNORMAL HIGH (ref 150–400)
RBC: 3.95 MIL/uL — ABNORMAL LOW (ref 4.22–5.81)
RDW: 20.4 % — ABNORMAL HIGH (ref 11.5–15.5)
WBC: 7 10*3/uL (ref 4.0–10.5)
nRBC: 0 % (ref 0.0–0.2)

## 2024-02-07 LAB — COMPREHENSIVE METABOLIC PANEL WITH GFR
ALT: 10 U/L (ref 0–44)
AST: 15 U/L (ref 15–41)
Albumin: 2.6 g/dL — ABNORMAL LOW (ref 3.5–5.0)
Alkaline Phosphatase: 61 U/L (ref 38–126)
Anion gap: 6 (ref 5–15)
BUN: 13 mg/dL (ref 6–20)
CO2: 16 mmol/L — ABNORMAL LOW (ref 22–32)
Calcium: 8.1 mg/dL — ABNORMAL LOW (ref 8.9–10.3)
Chloride: 122 mmol/L — ABNORMAL HIGH (ref 98–111)
Creatinine, Ser: 1.38 mg/dL — ABNORMAL HIGH (ref 0.61–1.24)
GFR, Estimated: >60 mL/min (ref >60)
Glucose, Bld: 54 mg/dL — ABNORMAL LOW (ref 70–99)
Potassium: 3.6 mmol/L (ref 3.5–5.1)
Sodium: 144 mmol/L (ref 135–145)
Total Bilirubin: 0.6 mg/dL (ref 0.0–1.2)
Total Protein: 5.3 g/dL — ABNORMAL LOW (ref 6.5–8.1)

## 2024-02-07 LAB — PHOSPHORUS: Phosphorus: 2.4 mg/dL — ABNORMAL LOW (ref 2.5–4.6)

## 2024-02-07 LAB — GLUCOSE, CAPILLARY
Glucose-Capillary: 129 mg/dL — ABNORMAL HIGH (ref 70–99)
Glucose-Capillary: 50 mg/dL — ABNORMAL LOW (ref 70–99)
Glucose-Capillary: 105 mg/dL — ABNORMAL HIGH (ref 70–99)
Glucose-Capillary: 91 mg/dL (ref 70–99)

## 2024-02-07 LAB — POCT I-STAT EG7
Acid-base deficit: 12 mmol/L — ABNORMAL HIGH (ref 0.0–2.0)
Bicarbonate: 14.4 mmol/L — ABNORMAL LOW (ref 20.0–28.0)
Calcium, Ion: 1.29 mmol/L (ref 1.15–1.40)
HCT: 31 % — ABNORMAL LOW (ref 39.0–52.0)
Hemoglobin: 10.5 g/dL — ABNORMAL LOW (ref 13.0–17.0)
O2 Saturation: 58 %
Patient temperature: 98.3
Potassium: 3.6 mmol/L (ref 3.5–5.1)
Sodium: 147 mmol/L — ABNORMAL HIGH (ref 135–145)
TCO2: 15 mmol/L — ABNORMAL LOW (ref 22–32)
pCO2, Ven: 31.6 mmHg — ABNORMAL LOW (ref 44–60)
pH, Ven: 7.266 (ref 7.25–7.43)
pO2, Ven: 34 mmHg (ref 32–45)

## 2024-02-07 LAB — BLOOD GAS, VENOUS
Acid-base deficit: 5.2 mmol/L — ABNORMAL HIGH (ref 0.0–2.0)
Bicarbonate: 20 mmol/L (ref 20.0–28.0)
Drawn by: 28514
O2 Saturation: 87.4 %
Patient temperature: 36.2
pCO2, Ven: 36 mmHg — ABNORMAL LOW (ref 44–60)
pH, Ven: 7.35 (ref 7.25–7.43)
pO2, Ven: 48 mmHg — ABNORMAL HIGH (ref 32–45)

## 2024-02-07 LAB — MAGNESIUM: Magnesium: 2.2 mg/dL (ref 1.7–2.4)

## 2024-02-07 LAB — MRSA NEXT GEN BY PCR, NASAL: MRSA by PCR Next Gen: NOT DETECTED

## 2024-02-07 LAB — LACTIC ACID, PLASMA: Lactic Acid, Venous: 1.2 mmol/L (ref 0.5–1.9)

## 2024-02-07 MED ORDER — SODIUM CHLORIDE 0.9 % IV SOLN
100.0000 mg | Freq: Two times a day (BID) | INTRAVENOUS | Status: DC
  Administered 2024-02-07 – 2024-02-08 (×2): 100 mg via INTRAVENOUS
  Filled 2024-02-07 (×3): qty 10

## 2024-02-07 MED ORDER — DEXTROSE 50 % IV SOLN
INTRAVENOUS | Status: AC
  Filled 2024-02-07: qty 50

## 2024-02-07 MED ORDER — LEVETIRACETAM (KEPPRA) 500 MG/5 ML ADULT IV PUSH
1000.0000 mg | Freq: Two times a day (BID) | INTRAVENOUS | Status: DC
  Administered 2024-02-07 – 2024-02-14 (×14): 1000 mg via INTRAVENOUS
  Filled 2024-02-07 (×18): qty 10

## 2024-02-07 MED ORDER — SODIUM CHLORIDE 0.9 % IV SOLN
200.0000 mg | Freq: Once | INTRAVENOUS | Status: AC
  Administered 2024-02-07: 200 mg via INTRAVENOUS
  Filled 2024-02-07: qty 20

## 2024-02-07 MED ORDER — LORAZEPAM 2 MG/ML IJ SOLN
2.0000 mg | Freq: Four times a day (QID) | INTRAMUSCULAR | Status: DC | PRN
  Administered 2024-02-07 – 2024-02-08 (×2): 2 mg via INTRAVENOUS
  Filled 2024-02-07 (×2): qty 1

## 2024-02-07 MED ORDER — LEVETIRACETAM (KEPPRA) 500 MG/5 ML ADULT IV PUSH
1000.0000 mg | Freq: Two times a day (BID) | INTRAVENOUS | Status: DC
  Filled 2024-02-07: qty 10

## 2024-02-07 MED ORDER — ALBUMIN HUMAN 25 % IV SOLN
INTRAVENOUS | Status: AC
  Filled 2024-02-07: qty 50

## 2024-02-07 MED ORDER — ALBUMIN HUMAN 25 % IV SOLN
INTRAVENOUS | Status: AC
Start: 2024-02-07 — End: 2024-02-07
  Administered 2024-02-07: 50 g via INTRAVENOUS
  Filled 2024-02-07: qty 50

## 2024-02-07 MED ORDER — SODIUM BICARBONATE 8.4 % IV SOLN
INTRAVENOUS | Status: DC
  Filled 2024-02-07 (×4): qty 1000

## 2024-02-07 MED ORDER — DEXTROSE 50 % IV SOLN
25.0000 g | INTRAVENOUS | Status: AC
Start: 2024-02-07 — End: 2024-02-07
  Administered 2024-02-07: 25 g via INTRAVENOUS

## 2024-02-07 MED ORDER — SODIUM CHLORIDE 0.9 % IV BOLUS
250.0000 mL | INTRAVENOUS | Status: AC
  Administered 2024-02-07: 250 mL via INTRAVENOUS

## 2024-02-07 MED ORDER — ALBUMIN HUMAN 25 % IV SOLN: 50.0000 g | INTRAVENOUS | Status: AC

## 2024-02-07 NOTE — Progress Notes (Signed)
 Subjective: Had 4 seizures overnight and received loading doses of Keppra and Vimpat.  Only opens eyes this morning, not following any commands  ROS: Unable to obtain due to poor mental status  Examination  Vital signs in last 24 hours: Temp:  [97.4 F (36.3 C)-98.3 F (36.8 C)] 98.3 F (36.8 C) (05/06 0819) Pulse Rate:  [76-88] 82 (05/06 0800) Resp:  [14-30] 16 (05/06 0800) BP: (80-228)/(52-175) 112/94 (05/06 0800) SpO2:  [94 %-100 %] 99 % (05/06 0800)  General: lying in bed, NAD Neuro: Opens eyes after repeated noxious stimulation, did not follow commands, did not track examiner in room, left eye legally blind, right pupil equally round and reactive to light, withdraws to noxious stimuli with antigravity strength in bilateral upper extremities, did not move bilateral lower extremities (is wheelchair-bound at baseline)  Basic Metabolic Panel: Recent Labs  Lab 02/01/24 0403 02/01/24 0405 02/02/24 0552 02/03/24 0610 02/04/24 0925 02/05/24 0237 02/06/24 0251 02/07/24 0938  NA  --    < > 142 144 146* 144 140 147*  K  --    < > 3.4* 3.8 2.8* 2.9* 3.9 3.6  CL  --    < > 117* 124* 119* 121* 121*  --   CO2  --    < > 14* 11* 15* 12* 14*  --   GLUCOSE  --    < > 95 82 76 164* 175*  --   BUN  --    < > 17 12 14 17 17   --   CREATININE  --    < > 1.39* 1.38* 1.40* 1.59* 1.52*  --   CALCIUM   --    < > 8.0* 8.1* 8.2* 8.1* 7.9*  --   MG 2.0  --   --  1.8 1.6* 2.7* 2.2  --   PHOS  --    < > 2.5 2.4* 2.1* 2.7 2.1*  --    < > = values in this interval not displayed.    CBC: Recent Labs  Lab 02/01/24 0010 02/01/24 0405 02/03/24 0610 02/04/24 0925 02/05/24 0237 02/06/24 0251 02/07/24 0532 02/07/24 0938  WBC 14.4*   < > 11.8* 11.0* 12.3* 8.8 7.0  --   NEUTROABS 11.8*  --   --   --   --   --   --   --   HGB 8.3*   < > 11.7* 11.5* 11.3* 10.5* 10.0* 10.5*  HCT 28.3*   < > 38.0* 37.5* 37.9* 35.4* 34.1* 31.0*  MCV 77.3*   < > 83.2 83.7 85.2 86.6 86.3  --   PLT 566*   < > 540* 513*  539* 466* 433*  --    < > = values in this interval not displayed.     Coagulation Studies: No results for input(s): "LABPROT", "INR" in the last 72 hours.  Imaging personally reviewed Mr brain wo contrast 02/06/2024: Likely chronic hydrocephalus and Chiari II malformation, detailed above. No remote priors are available to assess for change.  ASSESSMENT AND PLAN: 58 year old male with history of spina bifid and wheelchair-bound at baseline, chronic sacral decubitus ulcer, chronic osteomyelitis of ischial tuberosity who was admitted with weakness and was getting antibiotics.  While in the hospital he was noted to have seizures and neurology was consulted.  New onset seizures -Likely provoked in the setting of infection  Recommendations -Continue video EEG for another 24 hours to look for seizure recurrence - Continue Keppra 1000 mg twice daily and Vimpat 100 mg twice  daily - If any further seizures, can increase Vimpat - Discussed plan with Dr. Lorella Roles via secure chat  I have spent a total of 35   minutes with the patient reviewing hospital notes,  test results, labs and examining the patient as well as establishing an assessment and plan.  > 50% of time was spent in direct patient care.       Roxy Cordial Epilepsy [facility] For questions after 5pm please refer to AMION to reach the Neurologist on call

## 2024-02-07 NOTE — Plan of Care (Signed)
  Problem: Activity: Goal: Risk for activity intolerance will decrease 02/07/2024 1939 by Don Fritter, RN Outcome: Not Progressing 02/07/2024 1939 by Don Fritter, RN Outcome: Progressing   Problem: Nutrition: Goal: Adequate nutrition will be maintained 02/07/2024 1939 by Don Fritter, RN Outcome: Not Progressing 02/07/2024 1939 by Don Fritter, RN Outcome: Progressing

## 2024-02-07 NOTE — Procedures (Signed)
 Patient Name: [name]  MRN: [id number]  Epilepsy Attending: [name]  Referring Physician/Provider: Khaliqdina, Salman, MD  Duration: 02/06/2024 1505 to 02/07/2024 2005  Patient history: 58yo M with ams. EEG to evaluate for seizure   Level of alertness: Awake/ lethargic    AEDs during EEG study: LEV, LCM  Technical aspects: This EEG study was done with scalp electrodes positioned according to the 10-20 International system of electrode placement. Electrical activity was reviewed with band pass filter of 1-70Hz , sensitivity of 7 uV/mm, display speed of 60mm/sec with a 60Hz  notched filter applied as appropriate. EEG data were recorded continuously and digitally stored.  Video monitoring was available and reviewed as appropriate.   Description: EEG showed continuous generalized polymorphic high amplitude 5 to 6 Hz theta slowing admixed with intermittent 2-3hz  delta slowing. Hyperventilation and photic stimulation were not performed.     Four seizures were noted on 02/06/2024 at 2343, on 02/07/2024 at 040, 0520 and 0540. At the onset of seizure, no clinical signs were noted.  Subsequently patient had eye flutter followed by jerking in left upper extremity as well as facial twitching. This then progressed to bilateral upper extremity jerking. Concomitant EEG initially showed 4 to 5 Hz theta slowing in left frontotemporal region which then involved all of left hemisphere as well as right hemisphere and showed 3 to 5 Hz theta-delta slowing admixed with spike and wave at 2 to 3 Hz.  EEG then evolved into high amplitude sharply contoured 2 to 3 Hz delta slowing admixed with polyspikes in left hemisphere.  Duration of seizure was about 2-3 minutes.  EEG was disconnected between 02/06/2024 1714 to 2215 for mri brain   ABNORMALITY - Focal seizure, left frontotemporal region  - Continuous slow, generalized   IMPRESSION: This study showed four seizures as described above arising from left  frontotemporal region. Seizures were noted on /02/2024 at 2343, on 02/07/2024 at 040, 0520 and 0540, lasting about 2-3 minutes each. Additionally there is moderate diffuse encephalopathy.    Rhyse Skowron O Garry Bochicchio

## 2024-02-07 NOTE — Progress Notes (Signed)
 PROGRESS NOTE  [name] ZOX:[id number] DOB: 13-Mar-1966   PCP: [name], MD  Patient is from: Home.  Lives with sister.  Wheelchair-bound at baseline.  DOA: 01/31/2024 LOS: 6  Chief complaints Chief Complaint  Patient presents with   Fatigue   Weakness   Wound Check     Brief Narrative / Interim history: 58 year old M with PMH of spina bifid/neurogenic bladder with urostomy and colostomy, chronic sacral decubitus, chronic osteomyelitis of ischial tuberosity, inferior pubic ramus, HTN, anemia and recent hospitalization from 3/21-3/28 for septic shock in the setting of necrotizing soft tissue infection and sacral decubitus for which she was treated with vasopressor, broad-spectrum antibiotics and discharged on p.o. Augmentin  for 14 weeks per recommendation by ID.    Patient presents with generalized weakness.  Workup in ED concerning for acute on chronic osteomyelitis, acute on chronic anemia, AKI and hypokalemia.  He had mild leukocytosis to 14.4.  Hgb dropped from 8.3 (baseline) to 5.9 and 5.5 on repeat.  Cr 1.98 (was 1.0 on 3/27).  K2.2.  CT abdomen and pelvis concerning bilateral deep gluteal ulcers with acute on chronic pelvic osteomyelitis and progressive left hip septic joint.  Blood cultures obtained.  Started on broad-spectrum antibiotics and potassium supplementation.  2 units of blood ordered.  Orthopedic surgery consulted  The next day, transfused 2 units and hemoglobin improved to 11.5.  Evaluated by orthopedic surgery who did not feel there is need for surgical intervention.  ID initially recommended cefepime , oral Zyvox  and Flagyl  until 5/16 followed by Augmentin  for 4 weeks.  Hospital course complicated by effusion/delirium.  Cefepime  changed to ertapenem.  Flagyl  discontinued.  Ammonia, TSH and B12 within normal.  CT head with hydrocephalus and Chiari malformation features unchanged from prior CT in 2009. Spot EEG on 5/5 with moderate diffuse  encephalopathy without seizure or epileptiform discharge.  However, he had clinical seizure later on, and started on Keppra and LTM EEG. He had another seizure overnight.  Vimpat added.  MRI brain without acute finding.  Neurology and ID following.  Palliative consulted after talking to patient's sisters.   Subjective: Seen and examined earlier this morning.  Had electrographic and clinical seizure overnight that lasted about 3 minutes.  Vimpat was added and he was transferred to ICU.  Per neurology, no further seizure since Vimpat was added.  Patient is groggy barely wakes up to voice or physical stimulation.  Two of his sisters from [place] of state at bedside.  We had a lengthy discussion about patient's condition and CODE STATUS.  They have already started talking about this with his other sister, [name] who is a POA.  He voiced understanding but likes to talk to Ferguson again before making decision.  I offered palliative consult to facilitate conversation.  They appreciated and agreed to palliative consult.  Objective: Vitals:   02/07/24 0900 02/07/24 1000 02/07/24 1100 02/07/24 1125  BP: (!) 113/96 (!) 139/108 (!) 95/45   Pulse: 73 83 88   Resp: 19 (!) 23 (!) 22   Temp:    97.7 F (36.5 C)  TempSrc:    Oral  SpO2: 100% 95% 95%   Weight:      Height:        Examination:  GENERAL: No apparent distress.  Nontoxic. HEENT: MMM.  No facial asymmetry.  NECK: Supple.  No apparent JVD.  RESP:  No IWOB.  Fair aeration bilaterally. CVS:  RRR. Heart sounds normal.  ABD/GI/GU: BS+. Abd soft.  RLQ urostomy with  clear urine.  LLQ colostomy with some stool. MSK/EXT: BLE deformity and atrophy. SKIN: Unstageable sacral decubitus.  See picture under media. NEURO: Patient is somnolent.  Barely wakes up to physical stimulation.   PSYCH: Sleepy and somnolent.  Consultants:  Orthopedic surgery Infectious disease Neurology Palliative medicine  Procedures: None  Microbiology summarized: Blood  cultures NGTD  Assessment and plan: Sepsis secondary to acute on chronic osteomyelitis of the ischial tuberosity/inferior pubic ramus Left hip septic joint Spina bifida with sacral decubitus ulcer stage IV -Recently hospitalized for septic shock and discharged on Augmentin  for 14 weeks -CT-bilateral deep gluteal ulcers with acute on chronic pelvic osteo and progressive left hip septic joint.  . -CRP 25.  ESR 140.  Blood cultures NGTD. -Per Ortho, no good surgical remedy -IR consulted for drainage of left hip but recommended treating based on positive culture on 3/21 -Ertapenem and linezolid  until 5/16 followed by 4 weeks of Augmentin  per ID. -SNF hospital stay for wound care. -PICC line placed on 5/2. -Wound care  Acute encephalopathy/delirium/seizure activity: Likely due to seizure.  Patient with known history of hydrocephalus and Chiari malformation.  Briefly on cefepime  that was changed to ertapenem.  Ammonia, TSH and B12 within normal.  CT head and MRI brain with hydrocephalus and Chiari malformation which seems to be chronic.  Spot EEG on 5/5 with moderate diffuse encephalopathy but no seizure or epileptiform discharge.  However, he had clinical seizure later on and started on Keppra and LTM EEG.  He had another seizure overnight.  Vimpat added.  Per neurology, no seizure activity since Vimpat was added. -Continue Keppra and Vimpat per neurology -Ativan as needed for breakthrough seizure -Seizure precaution, n.p.o. -Goal of care discussion started.  Oropharyngeal dysphagia in the setting of encephalopathy - Changed essential meds to IV - Changed fluid to D5 bicarbonate given hypoglycemia, n.p.o. status, metabolic acidosis and hypernatremia  Acute on chronic anemia: Hgb dropped from 8.3-5.5.  Likely dilutional from IV fluid resuscitation.  No overt bleeding.  Hgb stable after 2 units. Recent Labs    02/01/24 0405 02/01/24 0439 02/01/24 1942 02/02/24 0535 02/03/24 0610  02/04/24 4098 02/05/24 0237 02/06/24 0251 02/07/24 0532 02/07/24 0938  HGB 5.9* 5.5* 11.6* 11.8* 11.7* 11.5* 11.3* 10.5* 10.0* 10.5*  -Continue monitoring  AKI: B/l Cr ~1.0.  Likely hypovolemic.  Not on nephrotoxic meds.  Seems to have good urine output. Recent Labs    12/29/23 0400 02/01/24 0010 02/01/24 0405 02/01/24 1942 02/02/24 0552 02/03/24 0610 02/04/24 0925 02/05/24 0237 02/06/24 0251 02/07/24 0532  BUN 11 32* 27* 22* 17 12 14 17 17 13   CREATININE 1.07 1.98* 1.78* 1.52* 1.39* 1.38* 1.40* 1.59* 1.52* 1.38*  -IV fluid as above -Avoid nephrotoxic meds. -Closely monitor urine output   Chronic hypotension: On midodrine  at home.  Cortisol within normal.  TSH slightly low but free T4 normal.  Normal TTE in 2017. -Midodrine  when able to take p.o. -IV fluid and IV albumin  as needed -Discontinued Flomax .  Diminished hearing/earwax in right ear: Left mastoid/middle ear effusion on CT. -Antibiotics as above -Debrox 3 times daily   Prolonged QTc: QTc 599 in the setting of severe hypokalemia.  Improved to 470. - Avoid or minimize QT prolonging drugs - Continue telemetry - Optimize electrolytes  Hypokalemia/hypomagnesemia/hypophosphatemia:  - Monitor replenish as appropriate   Hyponatremia: Resolved. -Continue monitoring  Hypernatremia: Improved. -IV fluid as above   Ventriculomegaly/Chiari malformation-chronic. History of spina bifida with a stage IV ulcer, neurogenic bladder, urostomy and colostomy -Continue wound and  ostomy care -Discontinue Flomax .  Does not help with neurogenic bladder   Non-anion gap metabolic acidosis: Likely due to renal failure.  Improved. -Manage renal failure as above -IV sodium bicarbonate  in D5 as above  Advance care planning: Significant comorbidity as above. Discussed patient's current condition and poor long-term prognosis with poor quality of life with 2 of patient's sisters at bedside.  We have also discussed pros and cons of  CPR, intubation and tube feeding.  They have already started thinking about this.  They agree that CPR and intubation is not to his best interest but they would like to discuss this with other sister, [name] who is POA.  -Palliative medicine consulted to facilitate discussion.  Family appreciative.  Nutrition/dysphagia Body mass index is 24.56 kg/m. Nutrition Problem: Increased nutrient needs Etiology: wound healing Signs/Symptoms: estimated needs Interventions: MVI, Juven  Unstageable pressure skin injury of bilateral buttocks: Present on arrival Pressure Injury 12/24/23 Buttocks Right (Active)  12/24/23 0100  Location: Buttocks  Location Orientation: Right  Staging:   Wound Description (Comments):   Present on Admission: Yes     Pressure Injury 12/24/23 Buttocks Left (Active)  12/24/23 0100  Location: Buttocks  Location Orientation: Left  Staging:   Wound Description (Comments):   Present on Admission: Yes   DVT prophylaxis:  enoxaparin  (LOVENOX ) injection 30 mg Start: 02/05/24 1530  Code Status: Full code pending decision by patient's sisters Family Communication: Updated 2 of patient's sisters at bedside Level of care: Progressive Status is: Inpatient Remains inpatient appropriate because: Acute on chronic osteomyelitis, seizure, encephalopathy, anemia, AKI and electrolyte derangements   Final disposition: To be determined   55 minutes with more than 50% spent in reviewing records, counseling patient/family and coordinating care.   Sch Meds:  Scheduled Meds:  carbamide peroxide  5 drop Right EAR TID   Chlorhexidine  Gluconate Cloth  6 each Topical Daily   dextrose        enoxaparin  (LOVENOX ) injection  30 mg Subcutaneous Q24H   levETIRAcetam  1,000 mg Intravenous Q12H   sodium bicarbonate   1,300 mg Oral TID   sodium chloride  flush  3 mL Intravenous Q12H   [START ON 02/09/2024] thiamine   100 mg Oral Daily   Continuous Infusions:  albumin  human     ertapenem 1 g  (02/07/24 1132)   lacosamide (VIMPAT) IV     linezolid  (ZYVOX ) IV 300 mL/hr at 02/07/24 1100   sodium bicarbonate  150 mEq in dextrose  5 % 1,150 mL infusion     thiamine  (VITAMIN B1) injection Stopped (02/07/24 0800)   PRN Meds:.acetaminophen  **OR** acetaminophen , albumin  human, dextrose , LORazepam, prochlorperazine , sodium chloride  flush, sodium chloride  flush  Antimicrobials: Anti-infectives (From admission, onward)    Start     Dose/Rate Route Frequency Ordered Stop   02/05/24 1045  linezolid  (ZYVOX ) IVPB 600 mg        600 mg 300 mL/hr over 60 Minutes Intravenous Every 12 hours 02/05/24 0945 02/17/24 0959   02/05/24 0045  linezolid  (ZYVOX ) IVPB 600 mg        600 mg 300 mL/hr over 60 Minutes Intravenous  Once 02/04/24 2349 02/05/24 0933   02/04/24 1700  ertapenem (INVANZ) 1 g in sodium chloride  0.9 % 100 mL IVPB        1 g 200 mL/hr over 30 Minutes Intravenous Daily 02/04/24 1600 02/17/24 0959   02/02/24 2200  ceFEPIme  (MAXIPIME ) 2 g in sodium chloride  0.9 % 100 mL IVPB  Status:  Discontinued  2 g 200 mL/hr over 30 Minutes Intravenous Every 12 hours 02/02/24 1600 02/04/24 1600   02/02/24 2200  metroNIDAZOLE  (FLAGYL ) tablet 500 mg  Status:  Discontinued        500 mg Oral Every 12 hours 02/02/24 1600 02/04/24 1600   02/02/24 2200  linezolid  (ZYVOX ) tablet 600 mg  Status:  Discontinued        600 mg Oral Every 12 hours 02/02/24 1600 02/05/24 0945   02/01/24 1815  DAPTOmycin  (CUBICIN ) 350 mg in sodium chloride  0.9 % IVPB  Status:  Discontinued        8 mg/kg  46 kg 114 mL/hr over 30 Minutes Intravenous Daily 02/01/24 1719 02/02/24 1600   02/01/24 0700  piperacillin -tazobactam (ZOSYN ) IVPB 3.375 g  Status:  Discontinued        3.375 g 12.5 mL/hr over 240 Minutes Intravenous Every 8 hours 02/01/24 0254 02/02/24 1600   02/01/24 0348  vancomycin  variable dose per unstable renal function (pharmacist dosing)  Status:  Discontinued         Does not apply See admin instructions  02/01/24 0348 02/01/24 1657   02/01/24 0015  vancomycin  (VANCOCIN ) IVPB 1000 mg/200 mL premix        1,000 mg 200 mL/hr over 60 Minutes Intravenous  Once 02/01/24 0010 02/01/24 0242   02/01/24 0015  piperacillin -tazobactam (ZOSYN ) IVPB 3.375 g        3.375 g 100 mL/hr over 30 Minutes Intravenous  Once 02/01/24 0010 02/01/24 0148        I have personally reviewed the following labs and images: CBC: Recent Labs  Lab 02/01/24 0010 02/01/24 0405 02/03/24 0610 02/04/24 0925 02/05/24 0237 02/06/24 0251 02/07/24 0532 02/07/24 0938  WBC 14.4*   < > 11.8* 11.0* 12.3* 8.8 7.0  --   NEUTROABS 11.8*  --   --   --   --   --   --   --   HGB 8.3*   < > 11.7* 11.5* 11.3* 10.5* 10.0* 10.5*  HCT 28.3*   < > 38.0* 37.5* 37.9* 35.4* 34.1* 31.0*  MCV 77.3*   < > 83.2 83.7 85.2 86.6 86.3  --   PLT 566*   < > 540* 513* 539* 466* 433*  --    < > = values in this interval not displayed.   BMP &GFR Recent Labs  Lab 02/03/24 0610 02/04/24 0925 02/05/24 0237 02/06/24 0251 02/07/24 0532 02/07/24 0938  NA 144 146* 144 140 144 147*  K 3.8 2.8* 2.9* 3.9 3.6 3.6  CL 124* 119* 121* 121* 122*  --   CO2 11* 15* 12* 14* 16*  --   GLUCOSE 82 76 164* 175* 54*  --   BUN 12 14 17 17 13   --   CREATININE 1.38* 1.40* 1.59* 1.52* 1.38*  --   CALCIUM  8.1* 8.2* 8.1* 7.9* 8.1*  --   MG 1.8 1.6* 2.7* 2.2 2.2  --   PHOS 2.4* 2.1* 2.7 2.1* 2.4*  --    Estimated Creatinine Clearance: 34 mL/min (A) (by C-G formula based on SCr of 1.38 mg/dL (H)). Liver & Pancreas: Recent Labs  Lab 02/01/24 0010 02/01/24 0405 02/01/24 1942 02/03/24 0610 02/04/24 1610 02/05/24 0237 02/06/24 0251 02/07/24 0532  AST 27 22  --   --   --   --  16 15  ALT 18 12  --   --   --   --  12 10  ALKPHOS 127* 84  --   --   --   --  64 61  BILITOT 0.6 0.9  --   --   --   --  0.3 0.6  PROT 6.6 5.4*  --   --   --   --  5.0* 5.3*  ALBUMIN  <1.5* 1.7*   < > 1.9* 1.6* <1.5* <1.5* 2.6*   < > = values in this interval not displayed.   No  results for input(s): "LIPASE", "AMYLASE" in the last 168 hours. Recent Labs  Lab 02/05/24 0320  AMMONIA 23   Diabetic: No results for input(s): "HGBA1C" in the last 72 hours. Recent Labs  Lab 02/07/24 0638 02/07/24 0656 02/07/24 0822 02/07/24 1126  GLUCAP 50* 129* 105* 91   Cardiac Enzymes: Recent Labs  Lab 02/02/24 0552  CKTOTAL 27*   No results for input(s): "PROBNP" in the last 8760 hours. Coagulation Profile: No results for input(s): "INR", "PROTIME" in the last 168 hours. Thyroid  Function Tests: No results for input(s): "TSH", "T4TOTAL", "FREET4", "T3FREE", "THYROIDAB" in the last 72 hours.  Lipid Profile: No results for input(s): "CHOL", "HDL", "LDLCALC", "TRIG", "CHOLHDL", "LDLDIRECT" in the last 72 hours. Anemia Panel: Recent Labs    02/05/24 0237  VITAMINB12 568   Urine analysis:    Component Value Date/Time   COLORURINE AMBER (A) 01/31/2024 0000   APPEARANCEUR CLOUDY (A) 01/31/2024 0000   APPEARANCEUR Cloudy (A) 03/21/2019 1130   LABSPEC 1.008 01/31/2024 0000   PHURINE 8.0 01/31/2024 0000   GLUCOSEU NEGATIVE 01/31/2024 0000   HGBUR SMALL (A) 01/31/2024 0000   BILIRUBINUR NEGATIVE 01/31/2024 0000   BILIRUBINUR Negative 03/21/2019 1130   KETONESUR NEGATIVE 01/31/2024 0000   PROTEINUR 100 (A) 01/31/2024 0000   NITRITE NEGATIVE 01/31/2024 0000   LEUKOCYTESUR LARGE (A) 01/31/2024 0000   Sepsis Labs: Invalid input(s): "PROCALCITONIN", "LACTICIDVEN"  Microbiology: Recent Results (from the past 240 hours)  Blood culture (routine x 2)     Status: None   Collection Time: 02/01/24 12:04 AM   Specimen: BLOOD  Result Value Ref Range Status   Specimen Description BLOOD SITE NOT SPECIFIED  Final   Special Requests   Final    BOTTLES DRAWN AEROBIC AND ANAEROBIC Blood Culture results may not be optimal due to an inadequate volume of blood received in culture bottles   Culture   Final    NO GROWTH 5 DAYS Performed at [facility], [address], [place], [place] [address]    Report Status 02/06/2024 FINAL  Final  Culture, blood (Routine X 2) w Reflex to ID Panel     Status: None   Collection Time: 02/01/24 12:36 AM   Specimen: BLOOD  Result Value Ref Range Status   Specimen Description BLOOD SITE NOT SPECIFIED  Final   Special Requests   Final    BOTTLES DRAWN AEROBIC AND ANAEROBIC Blood Culture results may not be optimal due to an inadequate volume of blood received in culture bottles   Culture   Final    NO GROWTH 5 DAYS Performed at [facility], [address] [place], [place] [address]    Report Status 02/06/2024 FINAL  Final  MRSA Next Gen by PCR, Nasal     Status: None   Collection Time: 02/01/24  2:44 PM   Specimen: Nasal Mucosa; Nasal Swab  Result Value Ref Range Status   MRSA by PCR Next Gen NOT DETECTED NOT DETECTED Final    Comment: (NOTE) The GeneXpert MRSA Assay (FDA approved for NASAL specimens only), is one component of a comprehensive MRSA  colonization surveillance program. It is not intended to diagnose MRSA infection nor to guide or monitor treatment for MRSA infections. Test performance is not FDA approved in patients less than 59 years old. Performed at [facility], [address], [place], [place] [address]   MRSA Next Gen by PCR, Nasal     Status: None   Collection Time: 02/07/24  6:38 AM   Specimen: Nasal Mucosa; Nasal Swab  Result Value Ref Range Status   MRSA by PCR Next Gen NOT DETECTED NOT DETECTED Final    Comment: (NOTE) The GeneXpert MRSA Assay (FDA approved for NASAL specimens only), is one component of a comprehensive MRSA colonization surveillance program. It is not intended to diagnose MRSA infection nor to guide or monitor treatment for MRSA infections. Test performance is not FDA approved in patients less than 18 years old. Performed at [facility], [address] [place], [place] [address]     Radiology Studies: Overnight EEG with video Result Date:  02/07/2024 [name], MD     02/07/2024  9:18 AM Patient Name: [name] MRN: [id number] Epilepsy Attending: [name] Referring Physician/Provider: Khaliqdina, Salman, MD Duration: 02/06/2024 1505 to 02/07/2024 0900 Patient history: 57yo M with ams. EEG to evaluate for seizure  Level of alertness: Awake/ lethargic  AEDs during EEG study: LEV, LCM Technical aspects: This EEG study was done with scalp electrodes positioned according to the 10-20 International system of electrode placement. Electrical activity was reviewed with band pass filter of 1-70Hz , sensitivity of 7 uV/mm, display speed of 89mm/sec with a 60Hz  notched filter applied as appropriate. EEG data were recorded continuously and digitally stored.  Video monitoring was available and reviewed as appropriate.  Description: EEG showed continuous generalized polymorphic high amplitude 5 to 6 Hz theta slowing admixed with intermittent 2-3hz  delta slowing. Hyperventilation and photic stimulation were not performed.   Four seizures were noted on 02/06/2024 at 2343, on 02/07/2024 at 040, 0520 and 0540. At the onset of seizure, no clinical signs were noted.  Subsequently patient had eye flutter followed by jerking in left upper extremity as well as facial twitching. This then progressed to bilateral upper extremity jerking. Concomitant EEG initially showed 4 to 5 Hz theta slowing in left frontotemporal region which then involved all of left hemisphere as well as right hemisphere and showed 3 to 5 Hz theta-delta slowing admixed with spike and wave at 2 to 3 Hz.  EEG then evolved into high amplitude sharply contoured 2 to 3 Hz delta slowing admixed with polyspikes in left hemisphere.  Duration of seizure was about 2-3 minutes. EEG was disconnected between 02/06/2024 1714 to 2215 for mri brain  ABNORMALITY - Focal seizure, left frontotemporal region - Continuous slow, generalized  IMPRESSION: This study showed four seizures as described above  arising from left frontotemporal region. Seizures were noted on /02/2024 at 2343, on 02/07/2024 at 040, 0520 and 0540, lasting about 2-3 minutes each. Additionally there is moderate diffuse encephalopathy.  [name]   MR BRAIN WO CONTRAST Result Date: 02/06/2024 CLINICAL DATA:  Mental status change, unknown cause. EXAM: MRI HEAD WITHOUT CONTRAST TECHNIQUE: Multiplanar, multiecho pulse sequences of the brain and surrounding structures were obtained without intravenous contrast. COMPARISON:  CT head May 3, 25. FINDINGS: Brain: Marked ventricular enlargement bilaterally with chronic thinning of the cerebral white matter and thinning of the corpus callosum. Small posterior fossa with inferior cerebellar and brainstem and herniation up to proximally 1.1 cm. No  evidence of acute infarct, acute hemorrhage, midline shift, or extra-axial fluid collection. Vascular: Major arterial flow voids are maintained at the skull base. Skull and upper cervical spine: Normal marrow signal. Sinuses/Orbits: Left maxillary sinus retention cyst. Otherwise, remaining sinuses are clear. Chronic changes of the left globe. IMPRESSION: Likely chronic hydrocephalus and Chiari II malformation, detailed above. No remote priors are available to assess for change. Electronically Signed   By: Stevenson Elbe M.D.   On: 02/06/2024 22:28   EEG adult Result Date: 02/06/2024 [name], MD     02/06/2024  2:04 PM Patient Name: Tyden Hille MRN: [id number] Epilepsy Attending: [name] Referring Physician/Provider: Brayton Calin, MD Date: 02/06/2024 Duration: 24.36 mins Patient history: 58yo M with ams. EEG to evaluate for seizure Level of alertness: Awake/ lethargic AEDs during EEG study: None Technical aspects: This EEG study was done with scalp electrodes positioned according to the 10-20 International system of electrode placement. Electrical activity was reviewed with band pass filter of 1-70Hz , sensitivity of 7  uV/mm, display speed of 22mm/sec with a 60Hz  notched filter applied as appropriate. EEG data were recorded continuously and digitally stored.  Video monitoring was available and reviewed as appropriate. Description: EEG showed continuous generalized polymorphic high amplitude 5 to 6 Hz theta slowing admixed with intermittent 2-3hz  delta slowing. Hyperventilation and photic stimulation were not performed.   ABNORMALITY - Continuous slow, generalized IMPRESSION: This study is suggestive of moderate diffuse encephalopathy. No seizures or epileptiform discharges were seen throughout the recording. Priyanka O Yadav      Philippe Gang T. Jenipher Havel [facility]  If 7PM-7AM, please contact night-coverage [email or web address] 02/07/2024, 12:00 PM

## 2024-02-07 NOTE — Progress Notes (Signed)
 SLP Cancellation Note  Patient Details Name: [name] MRN: [id number] DOB: [birth year]   Cancelled treatment:        Reviewed chart, spoke with nurse (not his primary) and sisters who report that he has not been responsive today. Pt soundly  sleeping and did not attempt to wake given current status. Noted Palliative has been consulted. ST will continue efforts.    Naomia Bachelor 02/07/2024, 1:05 PM

## 2024-02-07 NOTE — Progress Notes (Signed)
 Brief Nutrition Follow-Up:   Pt remains NPO, lethargic. Noted remains Full Code but palliative care has been consulted to facilitate GOC discussions.   Plan to await further decisions regarding GOC. If remains Full Code and/or aggressive nutrition intervention is desired, recommend insertion of Cortrak tube with initiation of TF.  Further recommendations to follow  Norvel Beer MS, RDN, LDN, CNSC Registered Dietitian 3 Clinical Nutrition RD Inpatient Contact Info in Amion

## 2024-02-07 NOTE — Progress Notes (Signed)
 Overnight cross coverage TR[facility], hospitalist service.   Received a call from bedside RN regarding the patient having seizures x2.  The patient is on continuous EEG and is being monitored by neurology.  IV antiepileptic drugs were adjusted.  Started on IV Keppra and then later IV Vimpat.  Presented at bedside.  The patient is postictal, however, he is able to protect his airway.  His sister is present in the room.  Hypotensive with BP of 80/60.  Unable to take his p.o. dose of midodrine  due to postictal status.  NS bolus to 50 cc x 1 and IV albumin  50 g x 1 ordered to be administered.  Goal MAP greater than 65.    Strict n.p.o. until more alert.  Order placed to upgrade to higher level of care, progressive unit from telemetry medical unit.  The patient was moved to progressive care unit, 2H09.  On exam, somnolent, postictal, protecting his airway.  O2 saturation 97% on room air.  Respiration rate 18.  Heart rate 85. Regular rate and rhythm. Lungs are clear to auscultation Soft abdomen Hypersomnolence.  Noncontrast brain MRI done on 02/06/2024 evening revealed: Likely chronic hydrocephalus and Chiari II malformation.  No evidence of acute infarct, acute hemorrhage, midline shift, or extra-axial fluid collection.   The patient was switched to Ertapenem (02/05/2024 ) from cefepime  in order to minimize the risk of lowering seizure threshold.  Unfortunately, at this time, not many options are available for the treatment of his infection.  On IV linezolid  rather than IV vancomycin  likely in the setting of renal insufficiency.  Defer antibiotics selection to infectious disease in coordination with neurology.    Critical care time: 35 minutes.

## 2024-02-07 NOTE — Progress Notes (Signed)
 LTM maint complete - no skin breakdown seen All leads attached [facility] monitored, Event button test confirmed by [facility]. Headbox reconnected and MR leads connected after MRI

## 2024-02-08 ENCOUNTER — Inpatient Hospital Stay (HOSPITAL_COMMUNITY)

## 2024-02-08 DIAGNOSIS — L89159 Pressure ulcer of sacral region, unspecified stage: Secondary | ICD-10-CM | POA: Diagnosis not present

## 2024-02-08 DIAGNOSIS — D649 Anemia, unspecified: Secondary | ICD-10-CM | POA: Diagnosis not present

## 2024-02-08 DIAGNOSIS — M4628 Osteomyelitis of vertebra, sacral and sacrococcygeal region: Secondary | ICD-10-CM | POA: Diagnosis not present

## 2024-02-08 DIAGNOSIS — R4182 Altered mental status, unspecified: Secondary | ICD-10-CM | POA: Diagnosis not present

## 2024-02-08 DIAGNOSIS — E872 Acidosis, unspecified: Secondary | ICD-10-CM | POA: Diagnosis not present

## 2024-02-08 DIAGNOSIS — Z515 Encounter for palliative care: Secondary | ICD-10-CM

## 2024-02-08 DIAGNOSIS — R569 Unspecified convulsions: Secondary | ICD-10-CM | POA: Diagnosis not present

## 2024-02-08 DIAGNOSIS — N179 Acute kidney failure, unspecified: Secondary | ICD-10-CM | POA: Diagnosis not present

## 2024-02-08 DIAGNOSIS — Z7189 Other specified counseling: Secondary | ICD-10-CM

## 2024-02-08 DIAGNOSIS — Z66 Do not resuscitate: Secondary | ICD-10-CM

## 2024-02-08 DIAGNOSIS — M86659 Other chronic osteomyelitis, unspecified thigh: Secondary | ICD-10-CM | POA: Diagnosis not present

## 2024-02-08 LAB — CBC
HCT: 35.6 % — ABNORMAL LOW (ref 39.0–52.0)
Hemoglobin: 10.6 g/dL — ABNORMAL LOW (ref 13.0–17.0)
MCH: 25 pg — ABNORMAL LOW (ref 26.0–34.0)
MCHC: 29.8 g/dL — ABNORMAL LOW (ref 30.0–36.0)
MCV: 84 fL (ref 80.0–100.0)
Platelets: 442 10*3/uL — ABNORMAL HIGH (ref 150–400)
RBC: 4.24 MIL/uL (ref 4.22–5.81)
RDW: 20.2 % — ABNORMAL HIGH (ref 11.5–15.5)
WBC: 8.1 10*3/uL (ref 4.0–10.5)
nRBC: 0 % (ref 0.0–0.2)

## 2024-02-08 LAB — COMPREHENSIVE METABOLIC PANEL WITH GFR
ALT: 10 U/L (ref 0–44)
AST: 16 U/L (ref 15–41)
Albumin: 2.1 g/dL — ABNORMAL LOW (ref 3.5–5.0)
Alkaline Phosphatase: 71 U/L (ref 38–126)
Anion gap: 11 (ref 5–15)
BUN: 8 mg/dL (ref 6–20)
CO2: 26 mmol/L (ref 22–32)
Calcium: 8 mg/dL — ABNORMAL LOW (ref 8.9–10.3)
Chloride: 112 mmol/L — ABNORMAL HIGH (ref 98–111)
Creatinine, Ser: 1.5 mg/dL — ABNORMAL HIGH (ref 0.61–1.24)
GFR, Estimated: 54 mL/min — ABNORMAL LOW (ref >60)
Glucose, Bld: 89 mg/dL (ref 70–99)
Potassium: 2.8 mmol/L — ABNORMAL LOW (ref 3.5–5.1)
Sodium: 149 mmol/L — ABNORMAL HIGH (ref 135–145)
Total Bilirubin: 0.5 mg/dL (ref 0.0–1.2)
Total Protein: 5.2 g/dL — ABNORMAL LOW (ref 6.5–8.1)

## 2024-02-08 LAB — GLUCOSE, CAPILLARY
Glucose-Capillary: 103 mg/dL — ABNORMAL HIGH (ref 70–99)
Glucose-Capillary: 172 mg/dL — ABNORMAL HIGH (ref 70–99)
Glucose-Capillary: 72 mg/dL (ref 70–99)
Glucose-Capillary: 76 mg/dL (ref 70–99)

## 2024-02-08 LAB — RENAL FUNCTION PANEL
Albumin: 2.1 g/dL — ABNORMAL LOW (ref 3.5–5.0)
Albumin: 1.9 g/dL — ABNORMAL LOW (ref 3.5–5.0)
Anion gap: 8 (ref 5–15)
Anion gap: 8 (ref 5–15)
BUN: 7 mg/dL (ref 6–20)
BUN: 5 mg/dL — ABNORMAL LOW (ref 6–20)
CO2: 26 mmol/L (ref 22–32)
CO2: 24 mmol/L (ref 22–32)
Calcium: 8 mg/dL — ABNORMAL LOW (ref 8.9–10.3)
Calcium: 7.6 mg/dL — ABNORMAL LOW (ref 8.9–10.3)
Chloride: 114 mmol/L — ABNORMAL HIGH (ref 98–111)
Chloride: 110 mmol/L (ref 98–111)
Creatinine, Ser: 1.26 mg/dL — ABNORMAL HIGH (ref 0.61–1.24)
Creatinine, Ser: 1.12 mg/dL (ref 0.61–1.24)
GFR, Estimated: >60 mL/min
GFR, Estimated: >60 mL/min (ref >60)
Glucose, Bld: 88 mg/dL (ref 70–99)
Glucose, Bld: 78 mg/dL (ref 70–99)
Phosphorus: 1.9 mg/dL — ABNORMAL LOW (ref 2.5–4.6)
Phosphorus: 1.5 mg/dL — ABNORMAL LOW (ref 2.5–4.6)
Potassium: 2.8 mmol/L — ABNORMAL LOW (ref 3.5–5.1)
Potassium: 4 mmol/L (ref 3.5–5.1)
Sodium: 148 mmol/L — ABNORMAL HIGH (ref 135–145)
Sodium: 142 mmol/L (ref 135–145)

## 2024-02-08 LAB — VITAMIN B1: Vitamin B1 (Thiamine): 134.5 nmol/L (ref 66.5–200.0)

## 2024-02-08 LAB — MAGNESIUM: Magnesium: 2 mg/dL (ref 1.7–2.4)

## 2024-02-08 MED ORDER — DEXTROSE 50 % IV SOLN: 50.0000 mL | INTRAVENOUS | Status: DC | PRN

## 2024-02-08 MED ORDER — DEXTROSE 50 % IV SOLN
50.0000 mL | INTRAVENOUS | Status: DC | PRN
  Administered 2024-02-08: 50 mL via INTRAVENOUS
  Filled 2024-02-08: qty 50

## 2024-02-08 MED ORDER — POTASSIUM CHLORIDE 10 MEQ/100ML IV SOLN
10.0000 meq | INTRAVENOUS | Status: AC
  Administered 2024-02-08 (×6): 10 meq via INTRAVENOUS
  Filled 2024-02-08 (×6): qty 100

## 2024-02-08 MED ORDER — POTASSIUM CL IN DEXTROSE 5% 20 MEQ/L IV SOLN
20.0000 meq | INTRAVENOUS | Status: AC
  Administered 2024-02-08 (×2): 20 meq via INTRAVENOUS
  Filled 2024-02-08: qty 1000

## 2024-02-08 MED ORDER — POTASSIUM CL IN DEXTROSE 5% 20 MEQ/L IV SOLN
20.0000 meq | INTRAVENOUS | Status: DC
  Administered 2024-02-08: 20 meq via INTRAVENOUS
  Filled 2024-02-08: qty 1000

## 2024-02-08 MED ORDER — SODIUM CHLORIDE 0.9 % IV SOLN
150.0000 mg | Freq: Two times a day (BID) | INTRAVENOUS | Status: DC
  Administered 2024-02-08 – 2024-02-14 (×12): 150 mg via INTRAVENOUS
  Filled 2024-02-08 (×13): qty 15

## 2024-02-08 NOTE — Progress Notes (Signed)
 LTM EEG hooked up and running - no initial skin breakdown - push button tested - [facility] monitoring.CT leads used

## 2024-02-08 NOTE — Progress Notes (Signed)
 [facility]  Date of Admission:  01/31/2024     Reason for Follow Up: Acute osteomyelitis of sacrum (HCC)  Total days of antibiotics 9         ASSESSMENT:  Mr. Nellum remains encephalopathic with onset of seizures and likely postictal in the setting of chronic pressure wounds and pelvic osteomyelitis.  Appears to be tolerating ertapenem and linezolid  with no adverse side effects.  Neurology following for seizures with previous cefepime  stopped.  Continue with plan for 2 weeks of antibiotics with end date of 02/16/2024 for pelvic osteomyelitis likely to require additional interventions.  Continue wound care per wound RN recommendations.  Optimize nutritional intake as able and frequent site offloading.  Remaining medical and supportive care per internal medicine.  PLAN:  Continue current dose of ertapenem and linezolid  through 02/16/2024. Seizure management per neurology. Continue wound care per wound RN recommendations. Therapeutic drug monitoring of platelet levels for thrombocytopenia while on linezolid . Continue contact precautions. Optimize nutritional intake as able with frequent side offloading. Remaining medical and supportive care per internal medicine.  ID will sign off.  Please reconsult if needed.  Principal Problem:   Acute on chronic osteomyelitis of sacrum (HCC) Active Problems:   Chronic hypokalemia   Essential hypertension   Acute kidney injury superimposed on chronic kidney disease (HCC)   Acute anemia   Sepsis (HCC)   Metabolic acidosis   History of spina bifida   Colostomy in place [facility])   History of urostomy   Sacral decubitus ulcer, stage IV (HCC)   Microcytic anemia   CKD (chronic kidney disease) stage 2, GFR 60-89 ml/min   Chronic hypotension   Generalized anxiety disorder    carbamide peroxide  5 drop Right EAR TID   Chlorhexidine  Gluconate Cloth  6 each Topical Daily   enoxaparin  (LOVENOX ) injection  30 mg  Subcutaneous Q24H   levETIRAcetam  1,000 mg Intravenous Q12H   sodium bicarbonate   1,300 mg Oral TID   sodium chloride  flush  3 mL Intravenous Q12H   [START ON 02/09/2024] thiamine   100 mg Oral Daily    SUBJECTIVE:  Afebrile overnight with questionable seizure activity.  Lethargic upon exam and does not follow commands or answer questions.  Allergies  Allergen Reactions   Morphine And Codeine Nausea And Vomiting   Metrizamide Hives   Sulfa Antibiotics Other (See Comments)    Reaction: unknown   Ivp Dye [Iodinated Contrast Media] Hives   Latex Rash     Review of Systems: Review of Systems  Unable to perform ROS: Mental status change      OBJECTIVE: Vitals:   02/08/24 0347 02/08/24 0455 02/08/24 0747 02/08/24 1015  BP: 90/62 104/83 112/72   Pulse: 85 92    Resp: 20 19 20    Temp: 97.6 F (36.4 C)  (!) 97.5 F (36.4 C) 99 F (37.2 C)  TempSrc: Oral  Axillary   SpO2: 100% 98% 100%   Weight:      Height:       Body mass index is 24.56 kg/m.  Physical Exam Constitutional:      General: He is not in acute distress.    Appearance: He is well-developed. He is ill-appearing.     Comments: Lying in bed with head of bed elevated; lethargic to obtunded  Cardiovascular:     Rate and Rhythm: Normal rate and regular rhythm.     Heart sounds: Normal heart sounds.  Pulmonary:     Effort: Pulmonary  effort is normal.     Breath sounds: Normal breath sounds.  Skin:    General: Skin is warm and dry.  Neurological:     Mental Status: He is oriented to person, place, and time.  Psychiatric:        Behavior: Behavior normal.        Thought Content: Thought content normal.        Judgment: Judgment normal.     Lab Results Lab Results  Component Value Date   WBC 8.1 02/08/2024   HGB 10.6 (L) 02/08/2024   HCT 35.6 (L) 02/08/2024   MCV 84.0 02/08/2024   PLT 442 (H) 02/08/2024    Lab Results  Component Value Date   CREATININE 1.26 (H) 02/08/2024   CREATININE 1.50 (H)  02/08/2024   BUN 7 02/08/2024   BUN 8 02/08/2024   NA 148 (H) 02/08/2024   NA 149 (H) 02/08/2024   K 2.8 (L) 02/08/2024   K 2.8 (L) 02/08/2024   CL 114 (H) 02/08/2024   CL 112 (H) 02/08/2024   CO2 26 02/08/2024   CO2 26 02/08/2024    Lab Results  Component Value Date   ALT 10 02/08/2024   AST 16 02/08/2024   ALKPHOS 71 02/08/2024   BILITOT 0.5 02/08/2024     Microbiology: Recent Results (from the past 240 hours)  Blood culture (routine x 2)     Status: None   Collection Time: 02/01/24 12:04 AM   Specimen: BLOOD  Result Value Ref Range Status   Specimen Description BLOOD SITE NOT SPECIFIED  Final   Special Requests   Final    BOTTLES DRAWN AEROBIC AND ANAEROBIC Blood Culture results may not be optimal due to an inadequate volume of blood received in culture bottles   Culture   Final    NO GROWTH 5 DAYS Performed at [facility], [address] [place], [place] [address]    Report Status 02/06/2024 FINAL  Final  Culture, blood (Routine X 2) w Reflex to ID Panel     Status: None   Collection Time: 02/01/24 12:36 AM   Specimen: BLOOD  Result Value Ref Range Status   Specimen Description BLOOD SITE NOT SPECIFIED  Final   Special Requests   Final    BOTTLES DRAWN AEROBIC AND ANAEROBIC Blood Culture results may not be optimal due to an inadequate volume of blood received in culture bottles   Culture   Final    NO GROWTH 5 DAYS Performed at [facility], [address], [place], [place] [address]    Report Status 02/06/2024 FINAL  Final  MRSA Next Gen by PCR, Nasal     Status: None   Collection Time: 02/01/24  2:44 PM   Specimen: Nasal Mucosa; Nasal Swab  Result Value Ref Range Status   MRSA by PCR Next Gen NOT DETECTED NOT DETECTED Final    Comment: (NOTE) The GeneXpert MRSA Assay (FDA approved for NASAL specimens only), is one component of a comprehensive MRSA colonization surveillance program. It is not intended to diagnose MRSA infection nor to guide or  monitor treatment for MRSA infections. Test performance is not FDA approved in patients less than 20 years old. Performed at [facility], [address] [place], [place] [address]   MRSA Next Gen by PCR, Nasal     Status: None   Collection Time: 02/07/24  6:38 AM   Specimen: Nasal Mucosa; Nasal Swab  Result Value Ref Range  Status   MRSA by PCR Next Gen NOT DETECTED NOT DETECTED Final    Comment: (NOTE) The GeneXpert MRSA Assay (FDA approved for NASAL specimens only), is one component of a comprehensive MRSA colonization surveillance program. It is not intended to diagnose MRSA infection nor to guide or monitor treatment for MRSA infections. Test performance is not FDA approved in patients less than 50 years old. Performed at [facility], [address] [place], [place] [address]      Marlan Silva, NP [facility]  02/08/2024  11:21 AM

## 2024-02-08 NOTE — Progress Notes (Signed)
 PHARMACY CONSULT NOTE FOR:  OUTPATIENT  PARENTERAL ANTIBIOTIC THERAPY (OPAT)  Indication: Pelvic osteomyelitis Regimen: Ertapenem 1 gm IV q 24 hours+ Linezolid  600 mg PO Q 12 hours  End date: 02/16/24  IV antibiotic discharge orders are pended. To discharging provider:  please sign these orders via discharge navigator,  Select New Orders & click on the button choice - Manage This Unsigned Work.     Thank you for allowing pharmacy to be a part of this patient's care.  Denson Flake, PharmD, BCPS, BCIDP Infectious Diseases Clinical Pharmacist Phone: [phone] 02/08/2024, 10:30 AM

## 2024-02-08 NOTE — Progress Notes (Signed)
 LTM maint complete - no skin breakdown seen.  Serviced [facility] monitored, Event button test confirmed by [facility].

## 2024-02-08 NOTE — Progress Notes (Signed)
 PROGRESS NOTE  [name] ZOX:[id number] DOB: Jul 25, 1966   PCP: [name], MD  Patient is from: Home.  Lives with sister.  Wheelchair-bound at baseline.  DOA: 01/31/2024 LOS: 7  Chief complaints Chief Complaint  Patient presents with   Fatigue   Weakness   Wound Check     Brief Narrative / Interim history: 58 year old M with PMH of spina bifid/neurogenic bladder with urostomy and colostomy, chronic sacral decubitus, chronic osteomyelitis of ischial tuberosity, inferior pubic ramus, HTN, anemia and recent hospitalization from 3/21-3/28 for septic shock in the setting of necrotizing soft tissue infection and sacral decubitus for which she was treated with vasopressor, broad-spectrum antibiotics and discharged on p.o. Augmentin  for 14 weeks per recommendation by ID.    Patient presents with generalized weakness.  Workup in ED concerning for acute on chronic osteomyelitis, acute on chronic anemia, AKI and hypokalemia.  He had mild leukocytosis to 14.4.  Hgb dropped from 8.3 (baseline) to 5.9 and 5.5 on repeat.  Cr 1.98 (was 1.0 on 3/27).  K2.2.  CT abdomen and pelvis concerning bilateral deep gluteal ulcers with acute on chronic pelvic osteomyelitis and progressive left hip septic joint.  Blood cultures obtained.  Started on broad-spectrum antibiotics and potassium supplementation.  2 units of blood ordered.  Orthopedic surgery consulted  The next day, transfused 2 units and hemoglobin improved to 11.5.  Evaluated by orthopedic surgery who did not feel there is need for surgical intervention.  ID initially recommended cefepime , oral Zyvox  and Flagyl  until 5/15 followed by Augmentin  for 4 weeks.  Hospital course complicated by effusion/delirium.  Cefepime  changed to ertapenem.  Flagyl  discontinued.  Ammonia, TSH and B12 within normal.  CT head and MRI brain with hydrocephalus and Chiari malformation features unchanged from prior CT in 2009.  Intermittent seizures since  5/5.  Now on Keppra and Vimpat and LTM EEG.  After discussion with patient's sisters, palliative consulted.  Goal status changed to DNR on 5/7.  Subjective: Seen and examined earlier this morning.  Had brief seizure that lasted about a minute and half after his LTM EEG was discontinued this morning.  Seizure aborted by IV Ativan.  Extensive discussion with patient's 3 sisters including Carmelina Chinchilla who is POA, [name]'s son and granddaughter.   Objective: Vitals:   02/08/24 0747 02/08/24 1015 02/08/24 1129 02/08/24 1135  BP: 112/72  131/63   Pulse:   (!) 117 (!) 110  Resp: 20  (!) 24   Temp: (!) 97.5 F (36.4 C) 99 F (37.2 C) 99.7 F (37.6 C)   TempSrc: Axillary  Axillary   SpO2: 100%  99% 100%  Weight:      Height:        Examination:  GENERAL: No apparent distress.  Somnolent. HEENT: MMM.  No facial asymmetry.  Corneal clouding of left eye (chronic) NECK: Supple.  No apparent JVD.  RESP:  No IWOB.  Fair aeration bilaterally. CVS: HR 110s.  Regular.  Heart sounds normal.  ABD/GI/GU: BS+. Abd soft.  RLQ urostomy with clear urine.  LLQ colostomy with some stool. MSK/EXT: BLE deformity and atrophy. SKIN: Unstageable sacral decubitus.  See picture under media. NEURO: Wakes to physical stimulation but does not interact or follow commands. PSYCH: Sleepy and somnolent.  Consultants:  Orthopedic surgery Infectious disease Neurology Palliative medicine  Procedures: None  Microbiology summarized: Blood cultures NGTD  Assessment and plan: Sepsis secondary to acute on chronic osteomyelitis of the ischial tuberosity/inferior pubic ramus Left hip septic joint Spina bifida with  sacral decubitus ulcer stage IV -Recently hospitalized for septic shock and discharged on Augmentin  for 14 weeks -CT-bilateral deep gluteal ulcers with acute on chronic pelvic osteo and progressive left hip septic joint.  . -CRP 25.  ESR 140.  Blood cultures NGTD. -Per Ortho, no good surgical remedy -IR  consulted for drainage of left hip but recommended treating based on positive culture on 3/21 -Ertapenem and linezolid  until 5/15 followed by 4 weeks of Augmentin  per ID. -PICC line placed on 5/2. -Wound care - Goals of care discussion  Acute encephalopathy/delirium/seizure activity:  CT head and MRI brain with hydrocephalus and Chiari malformation which seems to be chronic.  Intermittent seizure since 5/5.  Captured on EEG the night of 5/5.  Another brief episode the morning of 5/7 -Continue LTM EEG, Keppra and Vimpat per neurology -Ativan as needed for breakthrough seizure -Seizure precaution, n.p.o. -Correct electrolytes -Goal of care discussion started.  Oropharyngeal dysphagia in the setting of encephalopathy - Changed essential meds to IV - D5 infusion with KCl  Acute on chronic anemia: Hgb dropped from 8.3-5.5.  Likely dilutional from IV fluid resuscitation.  No overt bleeding.  Hgb stable after 2 units. Recent Labs    02/01/24 0439 02/01/24 1942 02/02/24 0535 02/03/24 0610 02/04/24 1308 02/05/24 0237 02/06/24 0251 02/07/24 0532 02/07/24 0938 02/08/24 0500  HGB 5.5* 11.6* 11.8* 11.7* 11.5* 11.3* 10.5* 10.0* 10.5* 10.6*  -Continue monitoring  AKI: B/l Cr ~1.0.  Likely hypovolemic.  Not on nephrotoxic meds.  Seems to have good urine output. Recent Labs    02/01/24 0010 02/01/24 0405 02/01/24 1942 02/02/24 0552 02/03/24 0610 02/04/24 0925 02/05/24 0237 02/06/24 0251 02/07/24 0532 02/08/24 0953  BUN 32* 27* 22* 17 12 14 17 17 13 7  8   CREATININE 1.98* 1.78* 1.52* 1.39* 1.38* 1.40* 1.59* 1.52* 1.38* 1.26*  1.50*  -IV fluid as above -Avoid nephrotoxic meds. -Closely monitor urine output   Chronic hypotension: On midodrine  at home.  Cortisol within normal.  TSH slightly low but free T4 normal.  Normal TTE in 2017.  Hypotension seems to have resolved -IV fluid and IV albumin  as needed -Discontinued Flomax .  Diminished hearing/earwax in right ear: Left  mastoid/middle ear effusion on CT. -Antibiotics as above -Debrox 3 times daily   Prolonged QTc: QTc 599 in the setting of severe hypokalemia.  Improved to 470. - Avoid or minimize QT prolonging drugs - Continue telemetry - Optimize electrolytes  Hypokalemia/hypomagnesemia/hypophosphatemia:  - Monitor replenish as appropriate   Hyponatremia: Resolved. -Continue monitoring  Hypernatremia: Improved. -IV fluid as above   Ventriculomegaly/Chiari malformation-chronic. History of spina bifida with a stage IV ulcer, neurogenic bladder, urostomy and colostomy -Continue wound and ostomy care -Discontinue Flomax .  Does not help with neurogenic bladder   Non-anion gap metabolic acidosis: Likely due to renal failure.  Resolved. -Manage renal failure as above  Advance care planning: Extensive discussion with patient's sisters on 5/6 and 5/7.  Now DNR-Limited. -Appreciate care by palliative medicine  Nutrition/dysphagia-currently n.p.o. -Will address based on goal of care Body mass index is 24.56 kg/m. Nutrition Problem: Increased nutrient needs Etiology: wound healing Signs/Symptoms: estimated needs Interventions: MVI, Juven  Unstageable pressure skin injury of bilateral buttocks: Present on arrival Pressure Injury 12/24/23 Buttocks Right (Active)  12/24/23 0100  Location: Buttocks  Location Orientation: Right  Staging:   Wound Description (Comments):   Present on Admission: Yes     Pressure Injury 12/24/23 Buttocks Left (Active)  12/24/23 0100  Location: Buttocks  Location Orientation: Left  Staging:  Wound Description (Comments):   Present on Admission: Yes   DVT prophylaxis:  enoxaparin  (LOVENOX ) injection 30 mg Start: 02/05/24 1530  Code Status: DNR-Limited Family Communication: Updated patient's 3 sisters and other family members at bedside Level of care: Progressive Status is: Inpatient Remains inpatient appropriate because: Acute on chronic osteomyelitis,  seizure, encephalopathy, anemia, AKI and electrolyte derangements   Final disposition: To be determined   55 minutes with more than 50% spent in reviewing records, counseling patient/family and coordinating care.   Sch Meds:  Scheduled Meds:  carbamide peroxide  5 drop Right EAR TID   Chlorhexidine  Gluconate Cloth  6 each Topical Daily   enoxaparin  (LOVENOX ) injection  30 mg Subcutaneous Q24H   levETIRAcetam  1,000 mg Intravenous Q12H   sodium chloride  flush  3 mL Intravenous Q12H   [START ON 02/09/2024] thiamine   100 mg Oral Daily   Continuous Infusions:  dextrose  5 % with KCl 20 mEq / L 20 mEq (02/08/24 1231)   ertapenem 1 g (02/08/24 1234)   lacosamide (VIMPAT) IV     linezolid  (ZYVOX ) IV 600 mg (02/08/24 1124)   potassium chloride  10 mEq (02/08/24 1344)   PRN Meds:.acetaminophen  **OR** acetaminophen , dextrose , LORazepam, prochlorperazine , sodium chloride  flush, sodium chloride  flush  Antimicrobials: Anti-infectives (From admission, onward)    Start     Dose/Rate Route Frequency Ordered Stop   02/05/24 1045  linezolid  (ZYVOX ) IVPB 600 mg        600 mg 300 mL/hr over 60 Minutes Intravenous Every 12 hours 02/05/24 0945 02/17/24 0959   02/05/24 0045  linezolid  (ZYVOX ) IVPB 600 mg        600 mg 300 mL/hr over 60 Minutes Intravenous  Once 02/04/24 2349 02/05/24 0933   02/04/24 1700  ertapenem (INVANZ) 1 g in sodium chloride  0.9 % 100 mL IVPB        1 g 200 mL/hr over 30 Minutes Intravenous Daily 02/04/24 1600 02/17/24 0959   02/02/24 2200  ceFEPIme  (MAXIPIME ) 2 g in sodium chloride  0.9 % 100 mL IVPB  Status:  Discontinued        2 g 200 mL/hr over 30 Minutes Intravenous Every 12 hours 02/02/24 1600 02/04/24 1600   02/02/24 2200  metroNIDAZOLE  (FLAGYL ) tablet 500 mg  Status:  Discontinued        500 mg Oral Every 12 hours 02/02/24 1600 02/04/24 1600   02/02/24 2200  linezolid  (ZYVOX ) tablet 600 mg  Status:  Discontinued        600 mg Oral Every 12 hours 02/02/24 1600 02/05/24  0945   02/01/24 1815  DAPTOmycin  (CUBICIN ) 350 mg in sodium chloride  0.9 % IVPB  Status:  Discontinued        8 mg/kg  46 kg 114 mL/hr over 30 Minutes Intravenous Daily 02/01/24 1719 02/02/24 1600   02/01/24 0700  piperacillin -tazobactam (ZOSYN ) IVPB 3.375 g  Status:  Discontinued        3.375 g 12.5 mL/hr over 240 Minutes Intravenous Every 8 hours 02/01/24 0254 02/02/24 1600   02/01/24 0348  vancomycin  variable dose per unstable renal function (pharmacist dosing)  Status:  Discontinued         Does not apply See admin instructions 02/01/24 0348 02/01/24 1657   02/01/24 0015  vancomycin  (VANCOCIN ) IVPB 1000 mg/200 mL premix        1,000 mg 200 mL/hr over 60 Minutes Intravenous  Once 02/01/24 0010 02/01/24 0242   02/01/24 0015  piperacillin -tazobactam (ZOSYN ) IVPB 3.375 g  3.375 g 100 mL/hr over 30 Minutes Intravenous  Once 02/01/24 0010 02/01/24 0148        I have personally reviewed the following labs and images: CBC: Recent Labs  Lab 02/04/24 0925 02/05/24 0237 02/06/24 0251 02/07/24 0532 02/07/24 0938 02/08/24 0500  WBC 11.0* 12.3* 8.8 7.0  --  8.1  HGB 11.5* 11.3* 10.5* 10.0* 10.5* 10.6*  HCT 37.5* 37.9* 35.4* 34.1* 31.0* 35.6*  MCV 83.7 85.2 86.6 86.3  --  84.0  PLT 513* 539* 466* 433*  --  442*   BMP &GFR Recent Labs  Lab 02/04/24 0925 02/05/24 0237 02/06/24 0251 02/07/24 0532 02/07/24 0938 02/08/24 0500 02/08/24 0953  NA 146* 144 140 144 147*  --  148*  149*  K 2.8* 2.9* 3.9 3.6 3.6  --  2.8*  2.8*  CL 119* 121* 121* 122*  --   --  114*  112*  CO2 15* 12* 14* 16*  --   --  26  26  GLUCOSE 76 164* 175* 54*  --   --  88  89  BUN 14 17 17 13   --   --  7  8  CREATININE 1.40* 1.59* 1.52* 1.38*  --   --  1.26*  1.50*  CALCIUM  8.2* 8.1* 7.9* 8.1*  --   --  8.0*  8.0*  MG 1.6* 2.7* 2.2 2.2  --  2.0  --   PHOS 2.1* 2.7 2.1* 2.4*  --   --  1.9*   Estimated Creatinine Clearance: 37.2 mL/min (A) (by C-G formula based on SCr of 1.26 mg/dL (H)). Liver  & Pancreas: Recent Labs  Lab 02/04/24 0925 02/05/24 0237 02/06/24 0251 02/07/24 0532 02/08/24 0953  AST  --   --  16 15 16   ALT  --   --  12 10 10   ALKPHOS  --   --  64 61 71  BILITOT  --   --  0.3 0.6 0.5  PROT  --   --  5.0* 5.3* 5.2*  ALBUMIN  1.6* <1.5* <1.5* 2.6* 2.1*  2.1*   No results for input(s): "LIPASE", "AMYLASE" in the last 168 hours. Recent Labs  Lab 02/05/24 0320  AMMONIA 23   Diabetic: No results for input(s): "HGBA1C" in the last 72 hours. Recent Labs  Lab 02/07/24 0822 02/07/24 1126 02/08/24 0019 02/08/24 0132 02/08/24 0609  GLUCAP 105* 91 72 172* 103*   Cardiac Enzymes: Recent Labs  Lab 02/02/24 0552  CKTOTAL 27*   No results for input(s): "PROBNP" in the last 8760 hours. Coagulation Profile: No results for input(s): "INR", "PROTIME" in the last 168 hours. Thyroid  Function Tests: No results for input(s): "TSH", "T4TOTAL", "FREET4", "T3FREE", "THYROIDAB" in the last 72 hours.  Lipid Profile: No results for input(s): "CHOL", "HDL", "LDLCALC", "TRIG", "CHOLHDL", "LDLDIRECT" in the last 72 hours. Anemia Panel: No results for input(s): "VITAMINB12", "FOLATE", "FERRITIN", "TIBC", "IRON", "RETICCTPCT" in the last 72 hours.  Urine analysis:    Component Value Date/Time   COLORURINE AMBER (A) 01/31/2024 0000   APPEARANCEUR CLOUDY (A) 01/31/2024 0000   APPEARANCEUR Cloudy (A) 03/21/2019 1130   LABSPEC 1.008 01/31/2024 0000   PHURINE 8.0 01/31/2024 0000   GLUCOSEU NEGATIVE 01/31/2024 0000   HGBUR SMALL (A) 01/31/2024 0000   BILIRUBINUR NEGATIVE 01/31/2024 0000   BILIRUBINUR Negative 03/21/2019 1130   KETONESUR NEGATIVE 01/31/2024 0000   PROTEINUR 100 (A) 01/31/2024 0000   NITRITE NEGATIVE 01/31/2024 0000   LEUKOCYTESUR LARGE (A) 01/31/2024 0000   Sepsis Labs:  Invalid input(s): "PROCALCITONIN", "LACTICIDVEN"  Microbiology: Recent Results (from the past 240 hours)  Blood culture (routine x 2)     Status: None   Collection Time: 02/01/24  12:04 AM   Specimen: BLOOD  Result Value Ref Range Status   Specimen Description BLOOD SITE NOT SPECIFIED  Final   Special Requests   Final    BOTTLES DRAWN AEROBIC AND ANAEROBIC Blood Culture results may not be optimal due to an inadequate volume of blood received in culture bottles   Culture   Final    NO GROWTH 5 DAYS Performed at [facility], [address] [place], [place] [address]    Report Status 02/06/2024 FINAL  Final  Culture, blood (Routine X 2) w Reflex to ID Panel     Status: None   Collection Time: 02/01/24 12:36 AM   Specimen: BLOOD  Result Value Ref Range Status   Specimen Description BLOOD SITE NOT SPECIFIED  Final   Special Requests   Final    BOTTLES DRAWN AEROBIC AND ANAEROBIC Blood Culture results may not be optimal due to an inadequate volume of blood received in culture bottles   Culture   Final    NO GROWTH 5 DAYS Performed at [facility], [address], [place], [place] [address]    Report Status 02/06/2024 FINAL  Final  MRSA Next Gen by PCR, Nasal     Status: None   Collection Time: 02/01/24  2:44 PM   Specimen: Nasal Mucosa; Nasal Swab  Result Value Ref Range Status   MRSA by PCR Next Gen NOT DETECTED NOT DETECTED Final    Comment: (NOTE) The GeneXpert MRSA Assay (FDA approved for NASAL specimens only), is one component of a comprehensive MRSA colonization surveillance program. It is not intended to diagnose MRSA infection nor to guide or monitor treatment for MRSA infections. Test performance is not FDA approved in patients less than 2 years old. Performed at [facility], [address] [place], [place] [address]   MRSA Next Gen by PCR, Nasal     Status: None   Collection Time: 02/07/24  6:38 AM   Specimen: Nasal Mucosa; Nasal Swab  Result Value Ref Range Status   MRSA by PCR Next Gen NOT DETECTED NOT DETECTED Final    Comment: (NOTE) The GeneXpert MRSA Assay (FDA approved for NASAL specimens only), is one component of  a comprehensive MRSA colonization surveillance program. It is not intended to diagnose MRSA infection nor to guide or monitor treatment for MRSA infections. Test performance is not FDA approved in patients less than 79 years old. Performed at [facility], [address], [place], [place] [address]     Radiology Studies: No results found.     Aden Sek T. Helen Winterhalter [facility]  If 7PM-7AM, please contact night-coverage [email or web address] 02/08/2024, 2:14 PM

## 2024-02-08 NOTE — Procedures (Addendum)
 Patient Name: [name]  MRN: [id number]  Epilepsy Attending: [name]  Referring Physician/Provider: Khaliqdina, Salman, MD  Duration: 02/07/2024 2005 to 02/08/2024 4098   Patient history: 58yo M with ams. EEG to evaluate for seizure   Level of alertness: Awake/ lethargic    AEDs during EEG study: LEV, LCM   Technical aspects: This EEG study was done with scalp electrodes positioned according to the 10-20 International system of electrode placement. Electrical activity was reviewed with band pass filter of 1-70Hz , sensitivity of 7 uV/mm, display speed of 18mm/sec with a 60Hz  notched filter applied as appropriate. EEG data were recorded continuously and digitally stored.  Video monitoring was available and reviewed as appropriate.   Description: EEG showed continuous generalized polymorphic high amplitude 5 to 6 Hz theta slowing admixed with intermittent 2-3hz  delta slowing. Hyperventilation and photic stimulation were not performed.      ABNORMALITY - Continuous slow, generalized   IMPRESSION: This study is suggestive of moderate diffuse encephalopathy. No further seizures were noted.   Feliberto Stockley O Shea Kapur

## 2024-02-08 NOTE — Consult Note (Signed)
 Palliative Care Consult Note                                  Date: 02/08/2024   Patient Name: [name]  DOB: 1966/01/01  MRN: [id number]  Age / Sex: 58 y.o., male  PCP: Chase Helms, MD Referring Physician: Theadore Finger, MD  Reason for Consultation: Establishing goals of care  HPI/Patient Profile: 58 y.o. male  with past medical history of spina bifida, neurogenic bladder with urostomy and colostomy, chronic sacral decubitus, chronic osteomyelitis of ischial tuberosity and inferior pubic ramus, anemia, and HTN.  He was recently hospitalized 3/21 - 3/28 for septic shock in the setting of necrotizing soft tissue infection and sacral decubitus.  He presented to the ED on 01/31/2024 with generalized weakness and was admitted with acute on chronic osteomyelitis, acute on chronic anemia, AKI, and hypokalemia.  Hospitalization complicated by confusion/delirium.  Palliative Medicine has been consulted for goals of care.  Subjective:   I have reviewed medical records including EPIC notes, labs and imaging, received report from the team, and assessed the patient at bedside.   Update received from RN.  Patient assessed at bedside.  I met with *** to discuss diagnosis, prognosis, GOC, EOL wishes, disposition, and options.  Patient is known to PMT from a previous hospitalization in August 2024.  I re-introduced Palliative Medicine as specialized medical care for people living with serious illness. It focuses on providing relief from the symptoms and stress of a serious illness.   We discussed patient's current illness and what it means in the larger context of his/her ongoing co-morbidities. Current clinical status was reviewed. Natural disease trajectory of *** was discussed.  Created space and opportunity for family to express thoughts and feelings regarding current medical situation. Values and goals of care were attempted to  be elicited.  A discussion was had today regarding advanced directives. Concepts specific to code status, artifical feeding and hydration, continued IV antibiotics and rehospitalization was had.  The MOST form was introduced and discussed.  Questions and concerns addressed. Patient/family encouraged to call with questions or concerns.     Life Review: ***  Functional Status: ***  Additional Discussion: ***  Review of Systems  Unable to perform ROS   Objective:   Primary Diagnoses: Present on Admission:  Chronic hypokalemia  Acute kidney injury superimposed on chronic kidney disease (HCC)  Sepsis (HCC)  Essential hypertension  Acute anemia   Physical Exam  Vital Signs:  BP 131/63 (BP Location: Left Arm)   Pulse (!) 110   Temp 99.7 F (37.6 C) (Axillary)   Resp (!) 24   Ht 4\' 6"  (1.372 m)   Wt 46.2 kg   SpO2 100%   BMI 24.56 kg/m   Palliative Assessment/Data: ***     Assessment & Plan:   SUMMARY OF RECOMMENDATIONS   ***  Primary Decision Maker: {Primary Decision QMVHQ:46962}  Code Status/Advance Care Planning: {Palliative Code status:23503}  Symptom Management:  ***  Prognosis:  {Palliative Care Prognosis:23504}  Discharge Planning:  {Palliative dispostion:23505}   Discussed with: ***    Thank you for allowing us  to participate in the care of [name]   Time Total: ***  Greater than 50%  of this time was spent counseling and coordinating care related to the above assessment and plan.  Signed by: Chase Scotland, NP Palliative Medicine Team  Team Phone # [phone]  For individual providers, please see AMION

## 2024-02-08 NOTE — Progress Notes (Signed)
 Patient noted to have seizure like activity shaking bilaterally and facial twitching noted lasting 1-2 minutes. Patient noted to do this twice. Prn medication given per order and Dr. Gonfa and Dr. Merceda Stairs notified.

## 2024-02-08 NOTE — Progress Notes (Signed)
 Subjective: No seizures for over 24 hours therefore EEG was discontinued this morning.  However after EEG was discontinued, per RN patient had right upper extremity twitching and eye flutter.  Therefore will repeat EEG.  ROS: Unable to obtain due to poor mental status  Examination  Vital signs in last 24 hours: Temp:  [97.3 F (36.3 C)-99.7 F (37.6 C)] 99.7 F (37.6 C) (05/07 1129) Pulse Rate:  [79-117] 110 (05/07 1135) Resp:  [18-25] 24 (05/07 1129) BP: (88-153)/(53-118) 131/63 (05/07 1129) SpO2:  [95 %-100 %] 100 % (05/07 1135)  General: lying in bed, not in apparent distress Neuro: Winces but does not open eyes to repeated noxious stimulation, did not follow commands, did not track examiner in room, left eye legally blind, right pupil equally round and reactive to light, withdraws to noxious stimuli with antigravity strength in bilateral upper extremities, did not move bilateral lower extremities (is wheelchair-bound at baseline)   Basic Metabolic Panel: Recent Labs  Lab 02/04/24 0925 02/05/24 0237 02/06/24 0251 02/07/24 0532 02/07/24 0938 02/08/24 0500 02/08/24 0953  NA 146* 144 140 144 147*  --  148*  149*  K 2.8* 2.9* 3.9 3.6 3.6  --  2.8*  2.8*  CL 119* 121* 121* 122*  --   --  114*  112*  CO2 15* 12* 14* 16*  --   --  26  26  GLUCOSE 76 164* 175* 54*  --   --  88  89  BUN 14 17 17 13   --   --  7  8  CREATININE 1.40* 1.59* 1.52* 1.38*  --   --  1.26*  1.50*  CALCIUM  8.2* 8.1* 7.9* 8.1*  --   --  8.0*  8.0*  MG 1.6* 2.7* 2.2 2.2  --  2.0  --   PHOS 2.1* 2.7 2.1* 2.4*  --   --  1.9*    CBC: Recent Labs  Lab 02/04/24 0925 02/05/24 0237 02/06/24 0251 02/07/24 0532 02/07/24 0938 02/08/24 0500  WBC 11.0* 12.3* 8.8 7.0  --  8.1  HGB 11.5* 11.3* 10.5* 10.0* 10.5* 10.6*  HCT 37.5* 37.9* 35.4* 34.1* 31.0* 35.6*  MCV 83.7 85.2 86.6 86.3  --  84.0  PLT 513* 539* 466* 433*  --  442*     Coagulation Studies: No results for input(s): "LABPROT", "INR" in the  last 72 hours.  Imaging No new brain imaging overnight  ASSESSMENT AND PLAN:58 year old male with history of spina bifid and wheelchair-bound at baseline, chronic sacral decubitus ulcer, chronic osteomyelitis of ischial tuberosity who was admitted with weakness and was getting antibiotics.  While in the hospital he was noted to have seizures and neurology was consulted.   New onset seizures -Likely provoked in the setting of infection   Recommendations - Will resume video EEG due to seizure-like episode this morning - Increase Vimpat to 150 mg twice daily.  EKG from 02/02/2024 reviewed. - Continue Keppra 1000 mg twice daily  - Discussed plan with Dr. Lorella Roles via secure chat and sisters at bedside   I have spent a total of with the patient reviewing hospital notes,  test results, labs and examining the patient as well as establishing an assessment and plan.  > 50% of time was spent in direct patient care.      Roxy Cordial Epilepsy [facility] For questions after 5pm please refer to AMION to reach the Neurologist on call

## 2024-02-08 NOTE — Progress Notes (Addendum)
 EEG D/C'd by CP. [facility] notified. No noted skin break down from EEG per CP.

## 2024-02-08 NOTE — Progress Notes (Signed)
 PT Cancellation Note  Patient Details Name: [name] MRN: [id number] DOB: [birth year]   Cancelled Treatment:    Reason Eval/Treat Not Completed: Patient's level of consciousness (Pt not able to participate meaningfully in PT session at this time. Per RN and chart review, pt lethargic and unable to follow commands. Acute PT to follow.)  [name] W, PT, DPT Secure Chat [facility] [phone]   Alissa April Adela Ades 02/08/2024, 4:00 PM

## 2024-02-09 ENCOUNTER — Inpatient Hospital Stay (HOSPITAL_COMMUNITY)

## 2024-02-09 DIAGNOSIS — L89159 Pressure ulcer of sacral region, unspecified stage: Secondary | ICD-10-CM | POA: Diagnosis not present

## 2024-02-09 DIAGNOSIS — M4628 Osteomyelitis of vertebra, sacral and sacrococcygeal region: Secondary | ICD-10-CM | POA: Diagnosis not present

## 2024-02-09 DIAGNOSIS — M86659 Other chronic osteomyelitis, unspecified thigh: Secondary | ICD-10-CM | POA: Diagnosis not present

## 2024-02-09 DIAGNOSIS — R569 Unspecified convulsions: Secondary | ICD-10-CM | POA: Diagnosis not present

## 2024-02-09 DIAGNOSIS — N179 Acute kidney failure, unspecified: Secondary | ICD-10-CM | POA: Diagnosis not present

## 2024-02-09 DIAGNOSIS — D649 Anemia, unspecified: Secondary | ICD-10-CM | POA: Diagnosis not present

## 2024-02-09 DIAGNOSIS — E872 Acidosis, unspecified: Secondary | ICD-10-CM | POA: Diagnosis not present

## 2024-02-09 LAB — CBC
HCT: 32.7 % — ABNORMAL LOW (ref 39.0–52.0)
Hemoglobin: 9.8 g/dL — ABNORMAL LOW (ref 13.0–17.0)
MCH: 25.8 pg — ABNORMAL LOW (ref 26.0–34.0)
MCHC: 30 g/dL (ref 30.0–36.0)
MCV: 86.1 fL (ref 80.0–100.0)
Platelets: 364 10*3/uL (ref 150–400)
RBC: 3.8 MIL/uL — ABNORMAL LOW (ref 4.22–5.81)
RDW: 20 % — ABNORMAL HIGH (ref 11.5–15.5)
WBC: 8.3 10*3/uL (ref 4.0–10.5)
nRBC: 0 % (ref 0.0–0.2)

## 2024-02-09 LAB — GLUCOSE, CAPILLARY
Glucose-Capillary: 122 mg/dL — ABNORMAL HIGH (ref 70–99)
Glucose-Capillary: 139 mg/dL — ABNORMAL HIGH (ref 70–99)
Glucose-Capillary: 77 mg/dL (ref 70–99)
Glucose-Capillary: 89 mg/dL (ref 70–99)

## 2024-02-09 LAB — RENAL FUNCTION PANEL
Albumin: 1.8 g/dL — ABNORMAL LOW (ref 3.5–5.0)
Anion gap: 9 (ref 5–15)
BUN: 6 mg/dL (ref 6–20)
CO2: 22 mmol/L (ref 22–32)
Calcium: 7.7 mg/dL — ABNORMAL LOW (ref 8.9–10.3)
Chloride: 109 mmol/L (ref 98–111)
Creatinine, Ser: 1 mg/dL (ref 0.61–1.24)
GFR, Estimated: >60 mL/min (ref >60)
Glucose, Bld: 77 mg/dL (ref 70–99)
Phosphorus: 1.4 mg/dL — ABNORMAL LOW (ref 2.5–4.6)
Potassium: 4 mmol/L (ref 3.5–5.1)
Sodium: 140 mmol/L (ref 135–145)

## 2024-02-09 LAB — MAGNESIUM: Magnesium: 1.6 mg/dL — ABNORMAL LOW (ref 1.7–2.4)

## 2024-02-09 LAB — PHOSPHORUS: Phosphorus: 1.4 mg/dL — ABNORMAL LOW (ref 2.5–4.6)

## 2024-02-09 MED ORDER — MAGNESIUM SULFATE 2 GM/50ML IV SOLN
2.0000 g | Freq: Once | INTRAVENOUS | Status: AC
  Administered 2024-02-09: 2 g via INTRAVENOUS
  Filled 2024-02-09: qty 50

## 2024-02-09 MED ORDER — SODIUM CHLORIDE 0.9 % IV SOLN
30.0000 mmol | Freq: Once | INTRAVENOUS | Status: AC
  Administered 2024-02-09: 30 mmol via INTRAVENOUS
  Filled 2024-02-09: qty 10

## 2024-02-09 MED ORDER — POTASSIUM CL IN DEXTROSE 5% 20 MEQ/L IV SOLN
20.0000 meq | INTRAVENOUS | Status: DC
  Administered 2024-02-09 – 2024-02-10 (×2): 20 meq via INTRAVENOUS
  Filled 2024-02-09 (×2): qty 1000

## 2024-02-09 NOTE — Progress Notes (Signed)
 PROGRESS NOTE  [name] FAO:[id number] DOB: August 22, 1966   PCP: [name], MD  Patient is from: Home.  Lives with sister.  Wheelchair-bound at baseline.  DOA: 01/31/2024 LOS: 8  Chief complaints Chief Complaint  Patient presents with   Fatigue   Weakness   Wound Check     Brief Narrative / Interim history: 58 year old M with PMH of spina bifid/neurogenic bladder with urostomy and colostomy, chronic sacral decubitus, chronic osteomyelitis of ischial tuberosity, inferior pubic ramus, HTN, anemia and recent hospitalization from 3/21-3/28 for septic shock in the setting of necrotizing soft tissue infection and sacral decubitus for which she was treated with vasopressor, broad-spectrum antibiotics and discharged on p.o. Augmentin  for 14 weeks per recommendation by ID.    Patient presents with generalized weakness.  Workup in ED concerning for acute on chronic osteomyelitis, acute on chronic anemia, AKI and hypokalemia.  He had mild leukocytosis to 14.4.  Hgb dropped from 8.3 (baseline) to 5.9 and 5.5 on repeat.  Cr 1.98 (was 1.0 on 3/27).  K2.2.  CT abdomen and pelvis concerning bilateral deep gluteal ulcers with acute on chronic pelvic osteomyelitis and progressive left hip septic joint.  Blood cultures obtained.  Started on broad-spectrum antibiotics and potassium supplementation.  2 units of blood ordered.  Orthopedic surgery consulted  The next day, transfused 2 units and hemoglobin improved to 11.5.  Evaluated by orthopedic surgery who did not feel there is need for surgical intervention.  ID initially recommended cefepime , oral Zyvox  and Flagyl  until 5/15 followed by Augmentin  for 4 weeks.  Hospital course complicated by effusion/delirium.  Cefepime  changed to ertapenem.  Flagyl  discontinued.  Ammonia, TSH and B12 within normal.  CT head and MRI brain with hydrocephalus and Chiari malformation features unchanged from prior CT in 2009.  Intermittent seizures since  5/5.  Now on Keppra and Vimpat and LTM EEG.  After discussion with patient's sisters, palliative consulted.  Goal status changed to DNR on 5/7.  Family leaning toward comfort care but has not decided yet.  Subjective: Seen and examined earlier this morning.  No major events overnight of this morning.  No clinical or electrographic seizure overnight.  Remains somnolent but wakes to voice.  Very minimal interaction.  Patient sister at bedside.  Objective: Vitals:   02/09/24 0006 02/09/24 0428 02/09/24 0749 02/09/24 1148  BP: 122/77 109/69 104/74 114/64  Pulse: 93 97 95 93  Resp: 18 18 17  (!) 21  Temp: 98.1 F (36.7 C) 97.8 F (36.6 C) 98.4 F (36.9 C)   TempSrc: Oral Oral Axillary   SpO2: 100% 100% 98% 100%  Weight:      Height:        Examination:  GENERAL: No apparent distress.  Somnolent. HEENT: MMM.  No facial asymmetry.  Corneal clouding of left eye (chronic) NECK: Supple.  No apparent JVD.  RESP:  No IWOB.  Fair aeration bilaterally. CVS: HR 110s.  Regular.  Heart sounds normal.  ABD/GI/GU: BS+. Abd soft.  RLQ urostomy with clear urine.  LLQ colostomy with some stool. MSK/EXT: BLE deformity and atrophy. SKIN: Unstageable sacral decubitus.  See picture under media. NEURO: Wakes to voice but does not interact much or follow commands. PSYCH: Sleepy and somnolent.  Consultants:  Orthopedic surgery Infectious disease Neurology Palliative medicine  Procedures: None  Microbiology summarized: Blood cultures NGTD  Assessment and plan: Sepsis due to acute on chronic osteomyelitis of the ischial tuberosity/inferior pubic ramus Left hip septic joint Spina bifida with sacral decubitus ulcer  stage IV -Recently hospitalized for septic shock and discharged on Augmentin  for 14 weeks -CT-b/l deep gluteal ulcers with acute on chronic pelvic osteo and progressive left hip septic joint.  . -CRP 25.  ESR 140.  Blood cultures NGTD. -Per Ortho, no good surgical remedy -IR  consulted for drainage of left hip but recommended treating based on positive culture on 3/21 -Ertapenem and linezolid  until 5/15 followed by 4 weeks of Augmentin  per ID. -PICC line placed on 5/2. -Wound care -Ongoing goals of care discussion  Acute encephalopathy/delirium/seizure activity:  CT head and MRI brain with hydrocephalus and Chiari malformation which seems to be chronic.  Intermittent seizure since 5/5.  Captured on EEG the night of 5/5.  Another brief episode the morning of 5/7.  -Continue LTM EEG, Keppra and Vimpat per neurology -Ativan as needed for breakthrough seizure -Seizure precaution, n.p.o. -Correct electrolytes -Goal of care discussion  Oropharyngeal dysphagia in the setting of encephalopathy -Changed essential meds to IV -D5 infusion with KCl -Family not interested in tube feed risk and benefit discussion.  Leaning toward comfort care.  Acute on chronic anemia: Hgb dropped from 8.3-5.5.  Likely dilutional from IV fluid resuscitation.  No overt bleeding.  Hgb stable after 2 units. Recent Labs    02/01/24 1942 02/02/24 0535 02/03/24 0610 02/04/24 0925 02/05/24 0237 02/06/24 0251 02/07/24 0532 02/07/24 0938 02/08/24 0500 02/09/24 0500  HGB 11.6* 11.8* 11.7* 11.5* 11.3* 10.5* 10.0* 10.5* 10.6* 9.8*  -Continue monitoring  AKI: B/l Cr ~1.0.  Likely hypovolemic.  Not on nephrotoxic meds.  AKI resolved. Recent Labs    02/01/24 1942 02/02/24 0552 02/03/24 0610 02/04/24 2841 02/05/24 0237 02/06/24 0251 02/07/24 0532 02/08/24 0953 02/08/24 1821 02/09/24 0500  BUN 22* 17 12 14 17 17 13 7  8  5* 6  CREATININE 1.52* 1.39* 1.38* 1.40* 1.59* 1.52* 1.38* 1.26*  1.50* 1.12 1.00  -IV fluid as above -Avoid nephrotoxic meds. -Closely monitor urine output   Chronic hypotension: On midodrine  at home.  Cortisol within normal.  TSH slightly low but free T4 normal.  Normal TTE in 2017.  Hypotension seems to have resolved -IV fluid and IV albumin  as  needed -Discontinued Flomax .  Diminished hearing/earwax in right ear: Left mastoid/middle ear effusion on CT. -Antibiotics as above -Debrox 3 times daily   Prolonged QTc: QTc 599 in the setting of severe hypokalemia.  Improved to 470. - Avoid or minimize QT prolonging drugs - Continue telemetry - Optimize electrolytes  Hypokalemia/hypomagnesemia/hypophosphatemia:  - Monitor replenish as appropriate   Hyponatremia: Resolved. -Continue monitoring  Hypernatremia: Improved. -IV fluid as above   Ventriculomegaly/Chiari malformation-chronic. History of spina bifida with a stage IV ulcer, neurogenic bladder, urostomy and colostomy -Continue wound and ostomy care -Discontinue Flomax .  Does not help with neurogenic bladder   Non-anion gap metabolic acidosis: Likely due to renal failure.  Resolved. -Manage renal failure as above  Advance care planning: Extensive discussion with patient's sisters on 5/6 and 5/7.  Now DNR-Limited.  Family leaning toward comfort care. -Appreciate care by palliative medicine  Nutrition/dysphagia-currently n.p.o. -Will address based on goal of care Body mass index is 24.56 kg/m. Nutrition Problem: Increased nutrient needs Etiology: wound healing Signs/Symptoms: estimated needs Interventions: MVI, Juven  Unstageable pressure skin injury of bilateral buttocks: Present on arrival Pressure Injury 12/24/23 Buttocks Right (Active)  12/24/23 0100  Location: Buttocks  Location Orientation: Right  Staging:   Wound Description (Comments):   Present on Admission: Yes     Pressure Injury 12/24/23 Buttocks Left (Active)  12/24/23 0100  Location: Buttocks  Location Orientation: Left  Staging:   Wound Description (Comments):   Present on Admission: Yes   DVT prophylaxis:  enoxaparin  (LOVENOX ) injection 30 mg Start: 02/05/24 1530  Code Status: DNR-Limited Family Communication: Updated patient's sister at bedside. Level of care: Progressive Status is:  Inpatient Remains inpatient appropriate because: Acute on chronic osteomyelitis, seizure, encephalopathy, anemia, AKI and electrolyte derangements   Final disposition: To be determined   55 minutes with more than 50% spent in reviewing records, counseling patient/family and coordinating care.   Sch Meds:  Scheduled Meds:  Chlorhexidine  Gluconate Cloth  6 each Topical Daily   enoxaparin  (LOVENOX ) injection  30 mg Subcutaneous Q24H   levETIRAcetam  1,000 mg Intravenous Q12H   sodium chloride  flush  3 mL Intravenous Q12H   Continuous Infusions:  ertapenem 1 g (02/09/24 1332)   lacosamide (VIMPAT) IV 150 mg (02/09/24 1034)   linezolid  (ZYVOX ) IV 600 mg (02/09/24 1140)   sodium PHOSPHATE  IVPB (in mmol) 30 mmol (02/09/24 1145)   PRN Meds:.acetaminophen  **OR** acetaminophen , dextrose , LORazepam, prochlorperazine , sodium chloride  flush, sodium chloride  flush  Antimicrobials: Anti-infectives (From admission, onward)    Start     Dose/Rate Route Frequency Ordered Stop   02/05/24 1045  linezolid  (ZYVOX ) IVPB 600 mg        600 mg 300 mL/hr over 60 Minutes Intravenous Every 12 hours 02/05/24 0945 02/17/24 0959   02/05/24 0045  linezolid  (ZYVOX ) IVPB 600 mg        600 mg 300 mL/hr over 60 Minutes Intravenous  Once 02/04/24 2349 02/05/24 0933   02/04/24 1700  ertapenem (INVANZ) 1 g in sodium chloride  0.9 % 100 mL IVPB        1 g 200 mL/hr over 30 Minutes Intravenous Daily 02/04/24 1600 02/17/24 0959   02/02/24 2200  ceFEPIme  (MAXIPIME ) 2 g in sodium chloride  0.9 % 100 mL IVPB  Status:  Discontinued        2 g 200 mL/hr over 30 Minutes Intravenous Every 12 hours 02/02/24 1600 02/04/24 1600   02/02/24 2200  metroNIDAZOLE  (FLAGYL ) tablet 500 mg  Status:  Discontinued        500 mg Oral Every 12 hours 02/02/24 1600 02/04/24 1600   02/02/24 2200  linezolid  (ZYVOX ) tablet 600 mg  Status:  Discontinued        600 mg Oral Every 12 hours 02/02/24 1600 02/05/24 0945   02/01/24 1815  DAPTOmycin   (CUBICIN ) 350 mg in sodium chloride  0.9 % IVPB  Status:  Discontinued        8 mg/kg  46 kg 114 mL/hr over 30 Minutes Intravenous Daily 02/01/24 1719 02/02/24 1600   02/01/24 0700  piperacillin -tazobactam (ZOSYN ) IVPB 3.375 g  Status:  Discontinued        3.375 g 12.5 mL/hr over 240 Minutes Intravenous Every 8 hours 02/01/24 0254 02/02/24 1600   02/01/24 0348  vancomycin  variable dose per unstable renal function (pharmacist dosing)  Status:  Discontinued         Does not apply See admin instructions 02/01/24 0348 02/01/24 1657   02/01/24 0015  vancomycin  (VANCOCIN ) IVPB 1000 mg/200 mL premix        1,000 mg 200 mL/hr over 60 Minutes Intravenous  Once 02/01/24 0010 02/01/24 0242   02/01/24 0015  piperacillin -tazobactam (ZOSYN ) IVPB 3.375 g        3.375 g 100 mL/hr over 30 Minutes Intravenous  Once 02/01/24 0010 02/01/24 0148  I have personally reviewed the following labs and images: CBC: Recent Labs  Lab 02/05/24 0237 02/06/24 0251 02/07/24 0532 02/07/24 0938 02/08/24 0500 02/09/24 0500  WBC 12.3* 8.8 7.0  --  8.1 8.3  HGB 11.3* 10.5* 10.0* 10.5* 10.6* 9.8*  HCT 37.9* 35.4* 34.1* 31.0* 35.6* 32.7*  MCV 85.2 86.6 86.3  --  84.0 86.1  PLT 539* 466* 433*  --  442* 364   BMP &GFR Recent Labs  Lab 02/05/24 0237 02/06/24 0251 02/07/24 0532 02/07/24 0938 02/08/24 0500 02/08/24 0953 02/08/24 1821 02/09/24 0500  NA 144 140 144 147*  --  148*  149* 142 140  K 2.9* 3.9 3.6 3.6  --  2.8*  2.8* 4.0 4.0  CL 121* 121* 122*  --   --  114*  112* 110 109  CO2 12* 14* 16*  --   --  26  26 24 22   GLUCOSE 164* 175* 54*  --   --  88  89 78 77  BUN 17 17 13   --   --  7  8 5* 6  CREATININE 1.59* 1.52* 1.38*  --   --  1.26*  1.50* 1.12 1.00  CALCIUM  8.1* 7.9* 8.1*  --   --  8.0*  8.0* 7.6* 7.7*  MG 2.7* 2.2 2.2  --  2.0  --   --  1.6*  PHOS 2.7 2.1* 2.4*  --   --  1.9* 1.5* 1.4*  1.4*   Estimated Creatinine Clearance: 46.9 mL/min (by C-G formula based on SCr of 1  mg/dL). Liver & Pancreas: Recent Labs  Lab 02/06/24 0251 02/07/24 0532 02/08/24 0953 02/08/24 1821 02/09/24 0500  AST 16 15 16   --   --   ALT 12 10 10   --   --   ALKPHOS 64 61 71  --   --   BILITOT 0.3 0.6 0.5  --   --   PROT 5.0* 5.3* 5.2*  --   --   ALBUMIN  <1.5* 2.6* 2.1*  2.1* 1.9* 1.8*   No results for input(s): "LIPASE", "AMYLASE" in the last 168 hours. Recent Labs  Lab 02/05/24 0320  AMMONIA 23   Diabetic: No results for input(s): "HGBA1C" in the last 72 hours. Recent Labs  Lab 02/08/24 0132 02/08/24 0609 02/08/24 1652 02/09/24 0005 02/09/24 0426  GLUCAP 172* 103* 76 122* 89   Cardiac Enzymes: No results for input(s): "CKTOTAL", "CKMB", "CKMBINDEX", "TROPONINI" in the last 168 hours.  No results for input(s): "PROBNP" in the last 8760 hours. Coagulation Profile: No results for input(s): "INR", "PROTIME" in the last 168 hours. Thyroid  Function Tests: No results for input(s): "TSH", "T4TOTAL", "FREET4", "T3FREE", "THYROIDAB" in the last 72 hours.  Lipid Profile: No results for input(s): "CHOL", "HDL", "LDLCALC", "TRIG", "CHOLHDL", "LDLDIRECT" in the last 72 hours. Anemia Panel: No results for input(s): "VITAMINB12", "FOLATE", "FERRITIN", "TIBC", "IRON", "RETICCTPCT" in the last 72 hours.  Urine analysis:    Component Value Date/Time   COLORURINE AMBER (A) 01/31/2024 0000   APPEARANCEUR CLOUDY (A) 01/31/2024 0000   APPEARANCEUR Cloudy (A) 03/21/2019 1130   LABSPEC 1.008 01/31/2024 0000   PHURINE 8.0 01/31/2024 0000   GLUCOSEU NEGATIVE 01/31/2024 0000   HGBUR SMALL (A) 01/31/2024 0000   BILIRUBINUR NEGATIVE 01/31/2024 0000   BILIRUBINUR Negative 03/21/2019 1130   KETONESUR NEGATIVE 01/31/2024 0000   PROTEINUR 100 (A) 01/31/2024 0000   NITRITE NEGATIVE 01/31/2024 0000   LEUKOCYTESUR LARGE (A) 01/31/2024 0000   Sepsis Labs: Invalid input(s): "PROCALCITONIN", "LACTICIDVEN"  Microbiology: Recent Results (from the past 240 hours)  Blood culture  (routine x 2)     Status: None   Collection Time: 02/01/24 12:04 AM   Specimen: BLOOD  Result Value Ref Range Status   Specimen Description BLOOD SITE NOT SPECIFIED  Final   Special Requests   Final    BOTTLES DRAWN AEROBIC AND ANAEROBIC Blood Culture results may not be optimal due to an inadequate volume of blood received in culture bottles   Culture   Final    NO GROWTH 5 DAYS Performed at [facility], [address] [place], [place] [address]    Report Status 02/06/2024 FINAL  Final  Culture, blood (Routine X 2) w Reflex to ID Panel     Status: None   Collection Time: 02/01/24 12:36 AM   Specimen: BLOOD  Result Value Ref Range Status   Specimen Description BLOOD SITE NOT SPECIFIED  Final   Special Requests   Final    BOTTLES DRAWN AEROBIC AND ANAEROBIC Blood Culture results may not be optimal due to an inadequate volume of blood received in culture bottles   Culture   Final    NO GROWTH 5 DAYS Performed at [facility], [address], [place], [place] [address]    Report Status 02/06/2024 FINAL  Final  MRSA Next Gen by PCR, Nasal     Status: None   Collection Time: 02/01/24  2:44 PM   Specimen: Nasal Mucosa; Nasal Swab  Result Value Ref Range Status   MRSA by PCR Next Gen NOT DETECTED NOT DETECTED Final    Comment: (NOTE) The GeneXpert MRSA Assay (FDA approved for NASAL specimens only), is one component of a comprehensive MRSA colonization surveillance program. It is not intended to diagnose MRSA infection nor to guide or monitor treatment for MRSA infections. Test performance is not FDA approved in patients less than 33 years old. Performed at [facility], [address], [place], [place] [address]   MRSA Next Gen by PCR, Nasal     Status: None   Collection Time: 02/07/24  6:38 AM   Specimen: Nasal Mucosa; Nasal Swab  Result Value Ref Range Status   MRSA by PCR Next Gen NOT DETECTED NOT DETECTED Final    Comment: (NOTE) The GeneXpert MRSA Assay  (FDA approved for NASAL specimens only), is one component of a comprehensive MRSA colonization surveillance program. It is not intended to diagnose MRSA infection nor to guide or monitor treatment for MRSA infections. Test performance is not FDA approved in patients less than 35 years old. Performed at [facility], [address], [place], [place] [address]     Radiology Studies: Overnight EEG with video Result Date: 02/09/2024 [name], MD     02/09/2024  9:42 AM Patient Name: [name] MRN: [id number] Epilepsy Attending: [name] Referring Physician/Provider: [name], MD Duration: 02/08/2024 1454 to 02/09/2024 0945  Patient history: 57yo M with ams. EEG to evaluate for seizure  Level of alertness: Awake/ lethargic  AEDs during EEG study: LEV, LCM  Technical aspects: This EEG study was done with scalp electrodes positioned according to the 10-20 International system of electrode placement. Electrical activity was reviewed with band pass filter of 1-70Hz , sensitivity of 7 uV/mm, display speed of 26mm/sec with a 60Hz  notched filter applied as appropriate. EEG data were recorded continuously and digitally stored.  Video monitoring was available and reviewed as appropriate.  Description: EEG showed  continuous generalized polymorphic high amplitude 5 to 6 Hz theta slowing admixed with intermittent 2-3hz  delta slowing. Hyperventilation and photic stimulation were not performed.    ABNORMALITY - Continuous slow, generalized  IMPRESSION: This study is suggestive of moderate diffuse encephalopathy. No further seizures were noted.  Priyanka O Yadav       [facility]  If 7PM-7AM, please contact night-coverage [email or web address] 02/09/2024, 2:38 PM

## 2024-02-09 NOTE — Progress Notes (Signed)
 LTM VIDEO EEG discontinued - light skin breakdown at [facility]. RN made aware.

## 2024-02-09 NOTE — Progress Notes (Signed)
 Subjective: No acute events overnight.  No new concerns.  ROS: Unable to obtain due to poor mental status  Examination  Vital signs in last 24 hours: Temp:  [97.8 F (36.6 C)-99.7 F (37.6 C)] 98.4 F (36.9 C) (05/08 0749) Pulse Rate:  [88-117] 95 (05/08 0749) Resp:  [17-24] 17 (05/08 0749) BP: (103-131)/(58-77) 104/74 (05/08 0749) SpO2:  [98 %-100 %] 98 % (05/08 0749)   General: lying in bed, not in apparent distress Neuro: Opens eyes and look at me with repeated tactile stimulation but did not follow commands, did not track examiner in room, left eye legally blind, right pupil equally round and reactive to light, withdraws to noxious stimuli with antigravity strength in bilateral upper extremities, did not move bilateral lower extremities (is wheelchair-bound at baseline)     Basic Metabolic Panel: Recent Labs  Lab 02/05/24 0237 02/06/24 0251 02/07/24 0532 02/07/24 0938 02/08/24 0500 02/08/24 0953 02/08/24 1821 02/09/24 0500  NA 144 140 144 147*  --  148*  149* 142 140  K 2.9* 3.9 3.6 3.6  --  2.8*  2.8* 4.0 4.0  CL 121* 121* 122*  --   --  114*  112* 110 109  CO2 12* 14* 16*  --   --  26  26 24 22   GLUCOSE 164* 175* 54*  --   --  88  89 78 77  BUN 17 17 13   --   --  7  8 5* 6  CREATININE 1.59* 1.52* 1.38*  --   --  1.26*  1.50* 1.12 1.00  CALCIUM  8.1* 7.9* 8.1*  --   --  8.0*  8.0* 7.6* 7.7*  MG 2.7* 2.2 2.2  --  2.0  --   --  1.6*  PHOS 2.7 2.1* 2.4*  --   --  1.9* 1.5* 1.4*  1.4*    CBC: Recent Labs  Lab 02/05/24 0237 02/06/24 0251 02/07/24 0532 02/07/24 0938 02/08/24 0500 02/09/24 0500  WBC 12.3* 8.8 7.0  --  8.1 8.3  HGB 11.3* 10.5* 10.0* 10.5* 10.6* 9.8*  HCT 37.9* 35.4* 34.1* 31.0* 35.6* 32.7*  MCV 85.2 86.6 86.3  --  84.0 86.1  PLT 539* 466* 433*  --  442* 364     Coagulation Studies: No results for input(s): "LABPROT", "INR" in the last 72 hours.  Imaging No new brain imaging overnight  ASSESSMENT AND PLAN;:58 year old male with  history of spina bifid and wheelchair-bound at baseline, chronic sacral decubitus ulcer, chronic osteomyelitis of ischial tuberosity who was admitted with weakness and was getting antibiotics.  While in the hospital he was noted to have seizures and neurology was consulted.   New onset seizures -Likely provoked in the setting of infection   Recommendations - DC LTM EEG as no further seizures - Continue Vimpat 150 mg twice daily and Keppra 1000 mg twice daily - Discussed plan with Dr. Lorella Roles via secure chat  -Follow-up with [facility] at discharge in 2 to 3 months -Neurology will sign off.  Please call us  for any further questions    If patient has another seizure, call 911 and bring them back to the ED if: A.  The seizure lasts longer than 5 minutes.      B.  The patient doesn't wake shortly after the seizure or has new problems such as difficulty seeing, speaking or moving following the seizure C.  The patient was injured during the seizure D.  The patient has a temperature over 102  F (39C) E.  The patient vomited during the seizure and now is having trouble breathing    During the Seizure   - First, ensure adequate ventilation and place patients on the floor on their left side  Loosen clothing around the neck and ensure the airway is patent. If the patient is clenching the teeth, do not force the mouth open with any object as this can cause severe damage - Remove all items from the surrounding that can be hazardous. The patient may be oblivious to what's happening and may not even know what he or she is doing. If the patient is confused and wandering, either gently guide him/her away and block access to outside areas - Reassure the individual and be comforting - Call 911. In most cases, the seizure ends before EMS arrives. However, there are cases when seizures may last over 3 to 5 minutes. Or the individual may have developed breathing difficulties or severe injuries.  If a pregnant patient or a person with diabetes develops a seizure, it is prudent to call an ambulance.     After the Seizure (Postictal Stage)   After a seizure, most patients experience confusion, fatigue, muscle pain and/or a headache. Thus, one should permit the individual to sleep. For the next few days, reassurance is essential. Being calm and helping reorient the person is also of importance.   Most seizures are painless and end spontaneously. Seizures are not harmful to others but can lead to complications such as stress on the lungs, brain and the heart. Individuals with prior lung problems may develop labored breathing and respiratory distress.      I have spent a total of 35 minutes with the patient reviewing hospital notes,  test results, labs and examining the patient as well as establishing an assessment and plan.  > 50% of time was spent in direct patient care.    Roxy Cordial Epilepsy [facility] For questions after 5pm please refer to AMION to reach the Neurologist on call

## 2024-02-09 NOTE — Progress Notes (Signed)
 SLP Cancellation Note  Patient Details Name: Vanderbilt Portee MRN: [id number] DOB: 07/29/1966   Cancelled treatment:       Reason Eval/Treat Not Completed: Fatigue/lethargy limiting ability to participate; nursing stated min responsiveness; suctioning often; ST will continue to f/u for po readiness as pt able.   Pat Duan Scharnhorst,M.S.,CCC-SLP 02/09/2024, 11:53 AM

## 2024-02-09 NOTE — Progress Notes (Signed)
 Physical Therapy Treatment Patient Details Name: [name] MRN: [id number] DOB: Sep 02, 1966 Today's Date: 02/09/2024   History of Present Illness Pt is a 58 y/o M admitted on 01/31/24 after presenting with generalized weakness. Pt is being treated for sepsis 2/2 acute on chronic osteomyelitis of the ischial tuberosity/inferior pubic ramus, L hip septic joint, spina bifida with sacral decubitus ulcer stage IV. PMH: spina bifida/neurogenic bladder with urostomy & colostomy, chronic sacral decubitus, chronic osteomyelitis of ischial tuberosity, inferior pubic ramus, HTN, anemia, recent hospitalization 3/21-3/28 for septic shock in the setting of necrotizing soft tissue infection & sacral decubitus    PT Comments  Pt greeted supine in bed, sister present in room. He required totalA for bed mobility. No active participation, engagement, or command following noted in session. Pt was lethargic keeping eyes closed for the majority of session, but intermittently opening his eyes when his sister spoke. Educated sister on pt positioning including the importance of a turning schedule in order to offload, prevent skin break down, and contracture formation. Will continue to follow acutely and advance appropriately to decrease caregiver burden.    If plan is discharge home, recommend the following: Two people to help with walking and/or transfers;Assist for transportation;Assistance with cooking/housework;Help with stairs or ramp for entrance;Two people to help with bathing/dressing/bathroom;Direct supervision/assist for financial management;Direct supervision/assist for medications management;Supervision due to cognitive status   Can travel by private vehicle        Equipment Recommendations  [name] lift    Recommendations for Other Services       Precautions / Restrictions Precautions Precautions: Fall Recall of Precautions/Restrictions: Impaired Restrictions Weight Bearing Restrictions  Per Provider Order: No     Mobility  Bed Mobility Overal bed mobility: Needs Assistance Bed Mobility: Rolling Rolling: Total assist         General bed mobility comments: Rolled R/L with use of bed pad to facilitate turn. Brought him into long sitting with no active core, trunk, or BUE engagement. Repositioned towards HOB using bed features and bed pad.    Transfers                   General transfer comment: Deferred secondary to lethargy    Ambulation/Gait                   Stairs             Wheelchair Mobility     Tilt Bed    Modified Rankin (Stroke Patients Only)       Balance                                            Communication Communication Communication: Impaired Factors Affecting Communication: Hearing impaired;Difficulty expressing self;Reduced clarity of speech  Cognition Arousal: Lethargic Behavior During Therapy: Flat affect   PT - Cognitive impairments: Difficult to assess Difficult to assess due to: Level of arousal                     PT - Cognition Comments: Pt maintained eyes closed throughout the majority of the session. He would intermittently open his eyes towards his sister. Following commands: Impaired Following commands impaired:  (No command following observed)    Cueing Cueing Techniques: Verbal cues, Gestural cues, Visual cues, Tactile cues  Exercises      General Comments General comments (skin integrity,  edema, etc.): Sister present and supportive throughout session. VSS.      Pertinent Vitals/Pain Pain Assessment Pain Assessment: PAINAD Breathing: normal Negative Vocalization: occasional moan/groan, low speech, negative/disapproving quality Facial Expression: smiling or inexpressive Body Language: relaxed Consolability: no need to console PAINAD Score: 1 Pain Intervention(s): Monitored during session, Repositioned    Home Living                           Prior Function            PT Goals (current goals can now be found in the care plan section) Acute Rehab PT Goals Patient Stated Goal: Pt unable to state. Sister reported decreasing caregiver burden and making pt comfortable. Progress towards PT goals: Not progressing toward goals - comment (Pt requires increased physical assist and is unable to follow commands)    Frequency    Min 1X/week      PT Plan      Co-evaluation              AM-PAC PT "6 Clicks" Mobility   Outcome Measure  Help needed turning from your back to your side while in a flat bed without using bedrails?: Total Help needed moving from lying on your back to sitting on the side of a flat bed without using bedrails?: Total Help needed moving to and from a bed to a chair (including a wheelchair)?: Total Help needed standing up from a chair using your arms (e.g., wheelchair or bedside chair)?: Total Help needed to walk in hospital room?: Total Help needed climbing 3-5 steps with a railing? : Total 6 Click Score: 6    End of Session   Activity Tolerance: Patient limited by lethargy Patient left: in bed;with call bell/phone within reach;with family/visitor present Nurse Communication: Mobility status;Need for lift equipment PT Visit Diagnosis: Muscle weakness (generalized) (M62.81);Other abnormalities of gait and mobility (R26.89);Other symptoms and signs involving the nervous system (R29.898)     Time: 4782-9562 PT Time Calculation (min) (ACUTE ONLY): 15 min  Charges:    $Therapeutic Activity: 8-22 mins PT General Charges $$ ACUTE PT VISIT: 1 Visit                     [name], PT, DPT Acute Rehabilitation Services Office: [phone] Secure Chat Preferred  [name] 02/09/2024, 9:49 AM

## 2024-02-09 NOTE — Progress Notes (Signed)
 LTM maint complete - no skin breakdown seen Fp2 F4 F8 [facility] monitored, Event button test confirmed by [facility].

## 2024-02-09 NOTE — Care Management Important Message (Signed)
 Important Message  Patient Details  Name: [name] MRN: [id number] Date of Birth: 03/04/1966   Important Message Given:  Yes - Medicare IM     Felix Host 02/09/2024, 11:17 AM

## 2024-02-09 NOTE — Procedures (Signed)
 Patient Name: Armend Baumhardt  MRN: [id number]  Epilepsy Attending: [name]  Referring Physician/Provider: [name], MD  Duration: 02/08/2024 1454 to 02/09/2024 0945   Patient history: 57yo M with ams. EEG to evaluate for seizure   Level of alertness: Awake/ lethargic    AEDs during EEG study: LEV, LCM   Technical aspects: This EEG study was done with scalp electrodes positioned according to the 10-20 International system of electrode placement. Electrical activity was reviewed with band pass filter of 1-70Hz , sensitivity of 7 uV/mm, display speed of 53mm/sec with a 60Hz  notched filter applied as appropriate. EEG data were recorded continuously and digitally stored.  Video monitoring was available and reviewed as appropriate.   Description: EEG showed continuous generalized polymorphic high amplitude 5 to 6 Hz theta slowing admixed with intermittent 2-3hz  delta slowing. Hyperventilation and photic stimulation were not performed.      ABNORMALITY - Continuous slow, generalized   IMPRESSION: This study is suggestive of moderate diffuse encephalopathy. No further seizures were noted.   Derrica Sieg O Marwan Lipe

## 2024-02-10 DIAGNOSIS — M4628 Osteomyelitis of vertebra, sacral and sacrococcygeal region: Secondary | ICD-10-CM | POA: Diagnosis not present

## 2024-02-10 LAB — CBC
HCT: 37.2 % — ABNORMAL LOW (ref 39.0–52.0)
Hemoglobin: 11.3 g/dL — ABNORMAL LOW (ref 13.0–17.0)
MCH: 25.7 pg — ABNORMAL LOW (ref 26.0–34.0)
MCHC: 30.4 g/dL (ref 30.0–36.0)
MCV: 84.5 fL (ref 80.0–100.0)
Platelets: 377 10*3/uL (ref 150–400)
RBC: 4.4 MIL/uL (ref 4.22–5.81)
RDW: 19.9 % — ABNORMAL HIGH (ref 11.5–15.5)
WBC: 8.4 10*3/uL (ref 4.0–10.5)
nRBC: 0 % (ref 0.0–0.2)

## 2024-02-10 LAB — RENAL FUNCTION PANEL
Albumin: 1.9 g/dL — ABNORMAL LOW (ref 3.5–5.0)
Anion gap: 10 (ref 5–15)
BUN: 5 mg/dL — ABNORMAL LOW (ref 6–20)
CO2: 25 mmol/L (ref 22–32)
Calcium: 8 mg/dL — ABNORMAL LOW (ref 8.9–10.3)
Chloride: 103 mmol/L (ref 98–111)
Creatinine, Ser: 1.03 mg/dL (ref 0.61–1.24)
GFR, Estimated: >60 mL/min (ref >60)
Glucose, Bld: 89 mg/dL (ref 70–99)
Phosphorus: 2.8 mg/dL (ref 2.5–4.6)
Potassium: 3.6 mmol/L (ref 3.5–5.1)
Sodium: 138 mmol/L (ref 135–145)

## 2024-02-10 LAB — GLUCOSE, CAPILLARY
Glucose-Capillary: 96 mg/dL (ref 70–99)
Glucose-Capillary: 141 mg/dL — ABNORMAL HIGH (ref 70–99)

## 2024-02-10 LAB — MAGNESIUM: Magnesium: 2.2 mg/dL (ref 1.7–2.4)

## 2024-02-10 MED ORDER — ONDANSETRON HCL 4 MG/2ML IJ SOLN
4.0000 mg | Freq: Four times a day (QID) | INTRAMUSCULAR | Status: DC | PRN
  Administered 2024-02-11: 4 mg via INTRAVENOUS
  Filled 2024-02-10: qty 2

## 2024-02-10 MED ORDER — MORPHINE SULFATE (PF) 2 MG/ML IV SOLN
1.0000 mg | INTRAVENOUS | Status: DC | PRN
  Administered 2024-02-11 – 2024-02-14 (×2): 2 mg via INTRAVENOUS
  Filled 2024-02-10 (×2): qty 1

## 2024-02-10 MED ORDER — GLYCOPYRROLATE 0.2 MG/ML IJ SOLN
0.1000 mg | Freq: Once | INTRAMUSCULAR | Status: AC | PRN
  Administered 2024-02-10: 0.1 mg via INTRAVENOUS
  Filled 2024-02-10: qty 1

## 2024-02-10 MED ORDER — POLYVINYL ALCOHOL 1.4 % OP SOLN: 1.0000 [drp] | Freq: Four times a day (QID) | OPHTHALMIC | Status: DC | PRN

## 2024-02-10 MED ORDER — ONDANSETRON 4 MG PO TBDP: 4.0000 mg | ORAL_TABLET | Freq: Four times a day (QID) | ORAL | Status: DC | PRN

## 2024-02-10 MED ORDER — HALOPERIDOL LACTATE 5 MG/ML IJ SOLN: 0.5000 mg | INTRAMUSCULAR | Status: DC | PRN

## 2024-02-10 MED ORDER — GLYCOPYRROLATE 0.2 MG/ML IJ SOLN
0.4000 mg | INTRAMUSCULAR | Status: DC | PRN
  Administered 2024-02-10 – 2024-02-12 (×2): 0.4 mg via INTRAVENOUS
  Filled 2024-02-10 (×2): qty 2

## 2024-02-10 MED ORDER — LORAZEPAM 2 MG/ML IJ SOLN: 2.0000 mg | INTRAMUSCULAR | Status: DC | PRN

## 2024-02-10 MED ORDER — BIOTENE DRY MOUTH MT LIQD: 15.0000 mL | OROMUCOSAL | Status: DC | PRN

## 2024-02-10 NOTE — Progress Notes (Signed)
 Overnight on call contacted re: increasing secretions. One time robinul. Rebound alertness; pt eye opening. Still non verbal.

## 2024-02-10 NOTE — Progress Notes (Signed)
 Palliative Medicine Progress Note   Patient Name: [name]       Date: 02/10/2024 DOB: 1966-07-20  Age: 58 y.o. MRN#: [id number] Attending Physician: Modena Andes, MD Primary Care Physician: [name], MD Admit Date: 01/31/2024   HPI/Patient Profile: 58 y.o. male  with past medical history of spina bifida, neurogenic bladder with urostomy and colostomy, chronic sacral decubitus, chronic osteomyelitis of ischial tuberosity and inferior pubic ramus, anemia, and HTN.  He was recently hospitalized 3/21 - 3/28 for septic shock in the setting of necrotizing soft tissue infection and sacral decubitus.  He presented to the ED on 01/31/2024 with generalized weakness and was admitted with acute on chronic osteomyelitis, acute on chronic anemia, AKI, and hypokalemia.  Hospitalization complicated by confusion/delirium.   Palliative Medicine has been consulted for goals of care.    Subjective: Chart reviewed. Update received from RN. Patient assessed at bedside.  He continues to be minimally responsive.  I met with all 3 sisters at bedside, in follow-up for GOC conversation. We reviewed comfort care, as de-escalating full scope medical interventions and allowing a natural course to occur. We reviewed this would entail discontinuing unnecessary interventions including cardiac monitoring, IV fluids, antibiotics, frequent vital signs, and CBG monitoring.  We reviewed that seizure medications would be continued.   We discussed the natural trajectory at end-of-life.  Family confirms they wish to transition to comfort care at this time.  They are not interested in transfer to an inpatient hospice facility, as they feel this will be distressing for patient.  Questions and concerns were  addressed.  Emotional support provided.    Objective:  Physical Exam Constitutional:      General: He is not in acute distress.    Appearance: He is ill-appearing.  Pulmonary:     Effort: No respiratory distress.  Neurological:     Mental Status: He is unresponsive.           Palliative Medicine Assessment & Plan   Assessment: Principal Problem:   Acute on chronic osteomyelitis of sacrum (HCC) Active Problems:   Chronic hypokalemia   Essential hypertension   Acute kidney injury superimposed on chronic kidney disease (HCC)   Acute anemia   Sepsis (HCC)   Metabolic acidosis   History of spina bifida   Colostomy in place [facility])  History of urostomy   Sacral decubitus ulcer, stage IV (HCC)   Microcytic anemia   CKD (chronic kidney disease) stage 2, GFR 60-89 ml/min   Chronic hypotension   Generalized anxiety disorder   DNR (do not resuscitate)    Recommendations/Plan: Transition to comfort care Discontinue IV fluids, cardiac monitoring, antibiotics, labs, and CBG checks Continue seizure medications PRN medications are available as per below for symptom management at end-of-life PMT will continue to follow  Symptom Management:  Morphine prn for pain or dyspnea Lorazepam  (ATIVAN ) prn for anxiety Haloperidol (HALDOL) prn for agitation  Glycopyrrolate (ROBINUL) for excessive secretions Ondansetron  (ZOFRAN ) prn for nausea Polyvinyl alcohol (LIQUIFILM TEARS) prn for dry eyes Antiseptic oral rinse (BIOTENE) prn for dry mouth   Code Status: DNR - comfort   Prognosis:  Days  Discharge Planning: Anticipated Hospital Death   Thank you for allowing the Palliative Medicine Team to assist in the care of this patient.   MDM - High  Detailed review of medical records (labs, imaging, vital signs), medically appropriate exam, discussed with treatment team, counseling and education to patient, family, & staff, documenting clinical information, medication management,  coordination of care.    Sehar Sedano B Lysander Calixte, NP   Please contact Palliative Medicine Team phone at [phone] for questions and concerns.  For individual providers, please see AMION.

## 2024-02-10 NOTE — Plan of Care (Signed)
  Problem: Pain Managment: Goal: General experience of comfort will improve and/or be controlled Outcome: Progressing   Problem: Fluid Volume: Goal: Hemodynamic stability will improve Outcome: Progressing

## 2024-02-10 NOTE — Progress Notes (Signed)
 Palliative Medicine Progress Note   Patient Name: [name]       Date: 02/10/2024 DOB: [birth year]  Age: 58 y.o. MRN#: [id number] Attending Physician: Modena Andes, MD Primary Care Physician: [name], MD Admit Date: 01/31/2024   HPI/Patient Profile: 58 y.o. male  with past medical history of spina bifida, neurogenic bladder with urostomy and colostomy, chronic sacral decubitus, chronic osteomyelitis of ischial tuberosity and inferior pubic ramus, anemia, and HTN.  He was recently hospitalized 3/21 - 3/28 for septic shock in the setting of necrotizing soft tissue infection and sacral decubitus.  He presented to the ED on 01/31/2024 with generalized weakness and was admitted with acute on chronic osteomyelitis, acute on chronic anemia, AKI, and hypokalemia.  Hospitalization complicated by confusion/delirium.   Palliative Medicine has been consulted for goals of care.  Subjective: Chart reviewed.  Update received from RN.  Patient assessed at bedside.  He remains minimally responsive.  Multiple family members at bedside.  They report patient will occasionally open his eyes and track, but otherwise does not respond.  Continued  conversation with family regarding the seriousness of patient's current medical situation. We reviewed his significant wound with osteomyelitis, seizures, poor nutritional status, and altered mental status/somnolence.       I shared my concern that patient is at end-of-life.  Discussed the limitations of medical interventions to prolong quality of life when the body fails to thrive.  Family continues to consider transition to comfort care, but are not ready to make that decision today.  They request that I follow-up with them again tomorrow,  especially as patient's sister and caregiver Carmelina Chinchilla) is off work.  Family's multiple questions and concerns were addressed to the best of my ability.  Emotional support provided.   Objective:  Physical Exam Vitals reviewed.  Constitutional:      General: He is not in acute distress.    Appearance: He is ill-appearing.  Pulmonary:     Effort: No respiratory distress.  Neurological:     Comments: Minimally responsive              Palliative Medicine Assessment & Plan   Assessment: Principal Problem:   Acute on chronic osteomyelitis of sacrum (HCC) Active Problems:   Chronic hypokalemia   Essential hypertension   Acute kidney injury superimposed on chronic  kidney disease (HCC)   Acute anemia   Sepsis (HCC)   Metabolic acidosis   History of spina bifida   Colostomy in place [facility])   History of urostomy   Sacral decubitus ulcer, stage IV (HCC)   Microcytic anemia   CKD (chronic kidney disease) stage 2, GFR 60-89 ml/min   Chronic hypotension   Generalized anxiety disorder   DNR (do not resuscitate)    Recommendations/Plan: DNR with limited interventions Continue current supportive interventions for now Family is considering transition to comfort care but needs additional time I will follow-up with family tomorrow  Primary Decision Maker: HCPOA - sister/[name]  Prognosis:  poor  Discharge Planning: To Be Determined   Thank you for allowing the Palliative Medicine Team to assist in the care of this patient.   Time: 50 minutes   Wynetta Heckle, NP   Please contact Palliative Medicine Team phone at [phone] for questions and concerns.  For individual providers, please see AMION.

## 2024-02-10 NOTE — Plan of Care (Signed)
  Problem: Health Behavior/Discharge Planning: Goal: Ability to manage health-related needs will improve Outcome: Not Progressing   Problem: Clinical Measurements: Goal: Ability to maintain clinical measurements within normal limits will improve Outcome: Not Progressing Goal: Will remain free from infection Outcome: Not Progressing Goal: Diagnostic test results will improve Outcome: Not Progressing Goal: Respiratory complications will improve Outcome: Not Progressing Goal: Cardiovascular complication will be avoided Outcome: Not Progressing   Problem: Activity: Goal: Risk for activity intolerance will decrease Outcome: Not Progressing   Problem: Nutrition: Goal: Adequate nutrition will be maintained Outcome: Not Progressing   Problem: Coping: Goal: Level of anxiety will decrease Outcome: Not Progressing   Problem: Elimination: Goal: Will not experience complications related to bowel motility Outcome: Not Progressing Goal: Will not experience complications related to urinary retention Outcome: Not Progressing   Problem: Pain Managment: Goal: General experience of comfort will improve and/or be controlled Outcome: Not Progressing   Problem: Safety: Goal: Ability to remain free from injury will improve Outcome: Not Progressing   Problem: Skin Integrity: Goal: Risk for impaired skin integrity will decrease Outcome: Not Progressing   Problem: Fluid Volume: Goal: Hemodynamic stability will improve Outcome: Not Progressing   Problem: Clinical Measurements: Goal: Diagnostic test results will improve Outcome: Not Progressing Goal: Signs and symptoms of infection will decrease Outcome: Not Progressing   Problem: Respiratory: Goal: Ability to maintain adequate ventilation will improve Outcome: Not Progressing   Problem: Urinary Elimination: Goal: Signs and symptoms of infection will decrease Outcome: Not Progressing

## 2024-02-10 NOTE — Progress Notes (Signed)
 PROGRESS NOTE    [name]  ZOX:[id number] DOB: [birth year] DOA: 01/31/2024 PCP: [name], MD   Brief Narrative:  58 year old M with PMH of spina bifid/neurogenic bladder with urostomy and colostomy, chronic sacral decubitus, chronic osteomyelitis of ischial tuberosity, inferior pubic ramus, HTN, anemia and recent hospitalization from 3/21-3/28 for septic shock in the setting of necrotizing soft tissue infection and sacral decubitus for which she was treated with vasopressor, broad-spectrum antibiotics and discharged on p.o. Augmentin  for 14 weeks per recommendation by ID.     Patient presents with generalized weakness.  Workup in ED concerning for acute on chronic osteomyelitis, acute on chronic anemia, AKI and hypokalemia.  He had mild leukocytosis to 14.4.  Hgb dropped from 8.3 (baseline) to 5.9 and 5.5 on repeat.  Cr 1.98 (was 1.0 on 3/27).  K2.2.  CT abdomen and pelvis concerning bilateral deep gluteal ulcers with acute on chronic pelvic osteomyelitis and progressive left hip septic joint.  Blood cultures obtained.  Started on broad-spectrum antibiotics and potassium supplementation.  2 units of blood ordered.  Orthopedic surgery consulted   The next day, transfused 2 units and hemoglobin improved to 11.5.  Evaluated by orthopedic surgery who did not feel there is need for surgical intervention.  ID initially recommended cefepime , oral Zyvox  and Flagyl  until 5/15 followed by Augmentin  for 4 weeks.   Hospital course complicated by effusion/delirium.  Cefepime  changed to ertapenem .  Flagyl  discontinued.  Ammonia, TSH and B12 within normal.  CT head and MRI brain with hydrocephalus and Chiari malformation features unchanged from prior CT in 2009.  Intermittent seizures since 5/5.  Now on Keppra  and Vimpat  and LTM EEG.   After discussion with patient's sisters, palliative consulted.  Goal status changed to DNR on 5/7.  Family leaning toward comfort care but has not decided  yet.  Assessment & Plan:   Principal Problem:   Acute on chronic osteomyelitis of sacrum (HCC) Active Problems:   Chronic hypokalemia   Acute kidney injury superimposed on chronic kidney disease (HCC)   Acute anemia   Sepsis (HCC)   Metabolic acidosis   Chronic hypotension   Essential hypertension   History of spina bifida   Colostomy in place [facility])   History of urostomy   Sacral decubitus ulcer, stage IV (HCC)   Microcytic anemia   CKD (chronic kidney disease) stage 2, GFR 60-89 ml/min   Generalized anxiety disorder   DNR (do not resuscitate)  Sepsis due to acute on chronic osteomyelitis of the ischial tuberosity/inferior pubic ramus Left hip septic joint Spina bifida with sacral decubitus ulcer stage IV -Recently hospitalized for septic shock and discharged on Augmentin  for 14 weeks -CT-b/l deep gluteal ulcers with acute on chronic pelvic osteo and progressive left hip septic joint.  . -CRP 25.  ESR 140.  Blood cultures NGTD. -Per Ortho, no good surgical remedy -IR consulted for drainage of left hip but recommended treating based on positive culture on 3/21 -Ertapenem  and linezolid  until 5/15 followed by 4 weeks of Augmentin  per ID. -PICC line placed on 5/2.  GOC discussion as below.   Acute encephalopathy/delirium/seizure activity:  CT head and MRI brain with hydrocephalus and Chiari malformation which seems to be chronic.  Intermittent seizure since 5/5.  Captured on EEG the night of 5/5.  Another brief episode the morning of 5/7.  Status post LTM EEG, Keppra  and Vimpat  per neurology -Ativan  as needed for breakthrough seizure -Seizure precaution, n.p.o.   Oropharyngeal dysphagia in the setting of encephalopathy -  Changed essential meds to IV -Family not interested in tube feed risk and benefit discussion.  GOC as below.   Acute on chronic anemia: Hgb dropped from 8.3-5.5.  Likely dilutional from IV fluid resuscitation.  No overt bleeding.  Hgb stable after 2 units.    AKI: Resolved   Chronic hypotension: On midodrine  at home.  Cortisol within normal.  TSH slightly low but free T4 normal.  Normal TTE in 2017.  Hypotension seems to have resolved.  Flomax  discontinued.   Diminished hearing/earwax in right ear: Left mastoid/middle ear effusion on CT. -Antibiotics as above -Debrox 3 times daily   Prolonged QTc: QTc 599 in the setting of severe hypokalemia.  Improved to 470.   Hypokalemia/hypomagnesemia/hypophosphatemia:  - Resolved  Ventriculomegaly/Chiari malformation-chronic. History of spina bifida with a stage IV ulcer, neurogenic bladder, urostomy and colostomy -Continue wound and ostomy care -Discontinue Flomax .  Does not help with neurogenic bladder   Non-anion gap metabolic acidosis: Likely due to renal failure.  Resolved.   Advance care planning: Extensive discussion by previous hospitalist Dr. Mae Schlossman with patient's sisters on 5/6 and 5/7.  Now DNR-Limited.  Palliative care engaged.  They were following daily.  I was able to meet with nephew and 2 sisters at the bedside.  They told me that they already had a conversation with palliative care yesterday and that they are planning to transition him to full comfort care this afternoon when palliative care will revisit.  Appreciate palliative care efforts.  DVT prophylaxis: enoxaparin  (LOVENOX ) injection 30 mg Start: 02/05/24 1530   Code Status: Limited: Do not attempt resuscitation (DNR) -DNR-LIMITED -Do Not Intubate/DNI   Family Communication: Nephew and 2 sisters present at bedside.   Status is: Inpatient Remains inpatient appropriate because: Patient will be transitioned to comfort care today.   Estimated body mass index is 24.56 kg/m as calculated from the following:   Height as of this encounter: 4\' 6"  (1.372 m).   Weight as of this encounter: 46.2 kg.  Pressure Injury 02/01/24 Buttocks Right;Left Unstageable - Full thickness tissue loss in which the base of the injury is covered by slough  (yellow, tan, gray, green or brown) and/or eschar (tan, brown or black) in the wound bed. (Active)  02/01/24 1148  Location: Buttocks  Location Orientation: Right;Left  Staging: Unstageable - Full thickness tissue loss in which the base of the injury is covered by slough (yellow, tan, gray, green or brown) and/or eschar (tan, brown or black) in the wound bed.  Wound Description (Comments):   Present on Admission: Yes  Dressing Type Gauze (Comment);ABD 02/08/24 2035   Nutritional Assessment: Body mass index is 24.56 kg/m.Aaron Aas Seen by dietician.  I agree with the assessment and plan as outlined below: Nutrition Status: Nutrition Problem: Increased nutrient needs Etiology: wound healing Signs/Symptoms: estimated needs Interventions: MVI, Juven  . Skin Assessment: I have examined the patient's skin and I agree with the wound assessment as performed by the wound care RN as outlined below: Pressure Injury 02/01/24 Buttocks Right;Left Unstageable - Full thickness tissue loss in which the base of the injury is covered by slough (yellow, tan, gray, green or brown) and/or eschar (tan, brown or black) in the wound bed. (Active)  02/01/24 1148  Location: Buttocks  Location Orientation: Right;Left  Staging: Unstageable - Full thickness tissue loss in which the base of the injury is covered by slough (yellow, tan, gray, green or brown) and/or eschar (tan, brown or black) in the wound bed.  Wound Description (Comments):  Present on Admission: Yes  Dressing Type Gauze (Comment);ABD 02/08/24 2035    Consultants:  Orthopedics ID and palliative care  Procedures:  As above  Antimicrobials:  Anti-infectives (From admission, onward)    Start     Dose/Rate Route Frequency Ordered Stop   02/05/24 1045  linezolid  (ZYVOX ) IVPB 600 mg        600 mg 300 mL/hr over 60 Minutes Intravenous Every 12 hours 02/05/24 0945 02/17/24 0959   02/05/24 0045  linezolid  (ZYVOX ) IVPB 600 mg        600 mg 300 mL/hr  over 60 Minutes Intravenous  Once 02/04/24 2349 02/05/24 0933   02/04/24 1700  ertapenem  (INVANZ ) 1 g in sodium chloride  0.9 % 100 mL IVPB        1 g 200 mL/hr over 30 Minutes Intravenous Daily 02/04/24 1600 02/17/24 0959   02/02/24 2200  ceFEPIme  (MAXIPIME ) 2 g in sodium chloride  0.9 % 100 mL IVPB  Status:  Discontinued        2 g 200 mL/hr over 30 Minutes Intravenous Every 12 hours 02/02/24 1600 02/04/24 1600   02/02/24 2200  metroNIDAZOLE  (FLAGYL ) tablet 500 mg  Status:  Discontinued        500 mg Oral Every 12 hours 02/02/24 1600 02/04/24 1600   02/02/24 2200  linezolid  (ZYVOX ) tablet 600 mg  Status:  Discontinued        600 mg Oral Every 12 hours 02/02/24 1600 02/05/24 0945   02/01/24 1815  DAPTOmycin  (CUBICIN ) 350 mg in sodium chloride  0.9 % IVPB  Status:  Discontinued        8 mg/kg  46 kg 114 mL/hr over 30 Minutes Intravenous Daily 02/01/24 1719 02/02/24 1600   02/01/24 0700  piperacillin -tazobactam (ZOSYN ) IVPB 3.375 g  Status:  Discontinued        3.375 g 12.5 mL/hr over 240 Minutes Intravenous Every 8 hours 02/01/24 0254 02/02/24 1600   02/01/24 0348  vancomycin  variable dose per unstable renal function (pharmacist dosing)  Status:  Discontinued         Does not apply See admin instructions 02/01/24 0348 02/01/24 1657   02/01/24 0015  vancomycin  (VANCOCIN ) IVPB 1000 mg/200 mL premix        1,000 mg 200 mL/hr over 60 Minutes Intravenous  Once 02/01/24 0010 02/01/24 0242   02/01/24 0015  piperacillin -tazobactam (ZOSYN ) IVPB 3.375 g        3.375 g 100 mL/hr over 30 Minutes Intravenous  Once 02/01/24 0010 02/01/24 0148         Subjective: Patient seen and examined.  Patient too lethargic to have any conversation or opens eyes.  Multiple family members at the bedside.  Patient appears comfortable.  Objective: Vitals:   02/09/24 2006 02/09/24 2343 02/10/24 0438 02/10/24 0759  BP: 98/61 94/74 105/77 99/69  Pulse: 91 95 97   Resp: 20 18 16    Temp: 98.2 F (36.8 C) 97.9 F  (36.6 C) 98.1 F (36.7 C) 99 F (37.2 C)  TempSrc: Oral Axillary Oral Axillary  SpO2: 94% 99% 97%   Weight:      Height:        Intake/Output Summary (Last 24 hours) at 02/10/2024 1123 Last data filed at 02/10/2024 0443 Gross per 24 hour  Intake 1782.6 ml  Output 1325 ml  Net 457.6 ml   Filed Weights   01/31/24 2146 02/02/24 0408  Weight: 46 kg 46.2 kg    Examination:  General exam: Appears calm and comfortable with  the spina bifida Respiratory system: Clear to auscultation. Respiratory effort normal. Cardiovascular system: S1 & S2 heard, RRR. No JVD, murmurs, rubs, gallops or clicks. No pedal edema. Gastrointestinal system: Abdomen is nondistended, soft and nontender. No organomegaly or masses felt. Normal bowel sounds heard. Central nervous system: Lethargic.   Data Reviewed: I have personally reviewed following labs and imaging studies  CBC: Recent Labs  Lab 02/06/24 0251 02/07/24 0532 02/07/24 0938 02/08/24 0500 02/09/24 0500 02/10/24 0459  WBC 8.8 7.0  --  8.1 8.3 8.4  HGB 10.5* 10.0* 10.5* 10.6* 9.8* 11.3*  HCT 35.4* 34.1* 31.0* 35.6* 32.7* 37.2*  MCV 86.6 86.3  --  84.0 86.1 84.5  PLT 466* 433*  --  442* 364 377   Basic Metabolic Panel: Recent Labs  Lab 02/06/24 0251 02/07/24 0532 02/07/24 0938 02/08/24 0500 02/08/24 0953 02/08/24 1821 02/09/24 0500 02/10/24 0459  NA 140 144 147*  --  148*  149* 142 140 138  K 3.9 3.6 3.6  --  2.8*  2.8* 4.0 4.0 3.6  CL 121* 122*  --   --  114*  112* 110 109 103  CO2 14* 16*  --   --  26  26 24 22 25   GLUCOSE 175* 54*  --   --  88  89 78 77 89  BUN 17 13  --   --  7  8 5* 6 5*  CREATININE 1.52* 1.38*  --   --  1.26*  1.50* 1.12 1.00 1.03  CALCIUM  7.9* 8.1*  --   --  8.0*  8.0* 7.6* 7.7* 8.0*  MG 2.2 2.2  --  2.0  --   --  1.6* 2.2  PHOS 2.1* 2.4*  --   --  1.9* 1.5* 1.4*  1.4* 2.8   GFR: Estimated Creatinine Clearance: 45.5 mL/min (by C-G formula based on SCr of 1.03 mg/dL). Liver Function  Tests: Recent Labs  Lab 02/06/24 0251 02/07/24 0532 02/08/24 0953 02/08/24 1821 02/09/24 0500 02/10/24 0459  AST 16 15 16   --   --   --   ALT 12 10 10   --   --   --   ALKPHOS 64 61 71  --   --   --   BILITOT 0.3 0.6 0.5  --   --   --   PROT 5.0* 5.3* 5.2*  --   --   --   ALBUMIN  <1.5* 2.6* 2.1*  2.1* 1.9* 1.8* 1.9*   No results for input(s): "LIPASE", "AMYLASE" in the last 168 hours. Recent Labs  Lab 02/05/24 0320  AMMONIA 23   Coagulation Profile: No results for input(s): "INR", "PROTIME" in the last 168 hours. Cardiac Enzymes: No results for input(s): "CKTOTAL", "CKMB", "CKMBINDEX", "TROPONINI" in the last 168 hours. BNP (last 3 results) No results for input(s): "PROBNP" in the last 8760 hours. HbA1C: No results for input(s): "HGBA1C" in the last 72 hours. CBG: Recent Labs  Lab 02/09/24 0005 02/09/24 0426 02/09/24 1653 02/09/24 2346 02/10/24 0441  GLUCAP 122* 89 77 139* 96   Lipid Profile: No results for input(s): "CHOL", "HDL", "LDLCALC", "TRIG", "CHOLHDL", "LDLDIRECT" in the last 72 hours. Thyroid  Function Tests: No results for input(s): "TSH", "T4TOTAL", "FREET4", "T3FREE", "THYROIDAB" in the last 72 hours. Anemia Panel: No results for input(s): "VITAMINB12", "FOLATE", "FERRITIN", "TIBC", "IRON", "RETICCTPCT" in the last 72 hours. Sepsis Labs: Recent Labs  Lab 02/07/24 0532  LATICACIDVEN 1.2    Recent Results (from the past 240 hours)  Blood culture (  routine x 2)     Status: None   Collection Time: 02/01/24 12:04 AM   Specimen: BLOOD  Result Value Ref Range Status   Specimen Description BLOOD SITE NOT SPECIFIED  Final   Special Requests   Final    BOTTLES DRAWN AEROBIC AND ANAEROBIC Blood Culture results may not be optimal due to an inadequate volume of blood received in culture bottles   Culture   Final    NO GROWTH 5 DAYS Performed at [facility], [address], [place], [place] [address]    Report Status 02/06/2024 FINAL  Final   Culture, blood (Routine X 2) w Reflex to ID Panel     Status: None   Collection Time: 02/01/24 12:36 AM   Specimen: BLOOD  Result Value Ref Range Status   Specimen Description BLOOD SITE NOT SPECIFIED  Final   Special Requests   Final    BOTTLES DRAWN AEROBIC AND ANAEROBIC Blood Culture results may not be optimal due to an inadequate volume of blood received in culture bottles   Culture   Final    NO GROWTH 5 DAYS Performed at [facility], [address] [place], [place] [address]    Report Status 02/06/2024 FINAL  Final  MRSA Next Gen by PCR, Nasal     Status: None   Collection Time: 02/01/24  2:44 PM   Specimen: Nasal Mucosa; Nasal Swab  Result Value Ref Range Status   MRSA by PCR Next Gen NOT DETECTED NOT DETECTED Final    Comment: (NOTE) The GeneXpert MRSA Assay (FDA approved for NASAL specimens only), is one component of a comprehensive MRSA colonization surveillance program. It is not intended to diagnose MRSA infection nor to guide or monitor treatment for MRSA infections. Test performance is not FDA approved in patients less than 64 years old. Performed at [facility], [address] [place], [place] [address]   MRSA Next Gen by PCR, Nasal     Status: None   Collection Time: 02/07/24  6:38 AM   Specimen: Nasal Mucosa; Nasal Swab  Result Value Ref Range Status   MRSA by PCR Next Gen NOT DETECTED NOT DETECTED Final    Comment: (NOTE) The GeneXpert MRSA Assay (FDA approved for NASAL specimens only), is one component of a comprehensive MRSA colonization surveillance program. It is not intended to diagnose MRSA infection nor to guide or monitor treatment for MRSA infections. Test performance is not FDA approved in patients less than 75 years old. Performed at [facility], [address], [place], [place] [address]      Radiology Studies: Overnight EEG with video Result Date: 02/09/2024 [name], MD     02/09/2024  9:42 AM Patient Name:  [name] MRN: [id number] Epilepsy Attending: [name] Referring Physician/Provider: [name], MD Duration: 02/08/2024 1454 to 02/09/2024 0945  Patient history: 57yo M with ams. EEG to evaluate for seizure  Level of alertness: Awake/ lethargic  AEDs during EEG study: LEV, LCM  Technical aspects: This EEG study was done with scalp electrodes positioned according to the 10-20 International system of electrode placement. Electrical activity was reviewed with band pass filter of 1-70Hz , sensitivity of 7 uV/mm, display speed of 70mm/sec with a 60Hz  notched filter applied as appropriate. EEG data were recorded continuously and digitally stored.  Video monitoring was available and reviewed as appropriate.  Description: EEG showed continuous generalized polymorphic high amplitude 5 to 6 Hz theta  slowing admixed with intermittent 2-3hz  delta slowing. Hyperventilation and photic stimulation were not performed.    ABNORMALITY - Continuous slow, generalized  IMPRESSION: This study is suggestive of moderate diffuse encephalopathy. No further seizures were noted.  Priyanka O Yadav    Scheduled Meds:  Chlorhexidine  Gluconate Cloth  6 each Topical Daily   enoxaparin  (LOVENOX ) injection  30 mg Subcutaneous Q24H   levETIRAcetam   1,000 mg Intravenous Q12H   sodium chloride  flush  3 mL Intravenous Q12H   Continuous Infusions:  ertapenem  1 g (02/10/24 1040)   lacosamide  (VIMPAT ) IV 150 mg (02/10/24 0952)   linezolid  (ZYVOX ) IV 600 mg (02/09/24 2208)     LOS: 9 days   Modena Andes, MD [facility]  02/10/2024, 11:23 AM   *Please note that this is a verbal dictation therefore any spelling or grammatical errors are due to the "Dragon [employer] One" system interpretation.  Please page via Amion and do not message via secure chat for urgent patient care matters. Secure chat can be used for non urgent patient care matters.  How to contact the [facility] Attending or Consulting provider 7A - 7P  or covering provider during after hours 7P -7A, for this patient?  Check the care team in [facility] and look for a) attending/consulting [facility] provider listed and b) the [facility] team listed. Page or secure chat 7A-7P. Log into [email or web address] and use [facility] universal password to access. If you do not have the password, please contact the hospital operator. Locate the [facility] provider you are looking for under [facility] and page to a number that you can be directly reached. If you still have difficulty reaching the provider, please page the [facility] (Director on Call) for the Hospitalists listed on amion for assistance.

## 2024-02-10 NOTE — Progress Notes (Signed)
 SLP Cancellation Note  Patient Details Name: [name] MRN: [id number] DOB: 03-Mar-1966   Cancelled treatment:   Pt's sisters were leaving room as I approached; they shared that pt will be transitioning to comfort care.  Our service will respectfully sign off.  Alaiza Yau L. Beatris Lincoln, MA CCC/SLP Clinical Specialist - Acute Care SLP Acute Rehabilitation Services Office number [phone]        Myna Asal Laurice 02/10/2024, 2:36 PM

## 2024-02-10 NOTE — Progress Notes (Signed)
 Nutrition Brief Note  Chart reviewed. Pt transitioning to comfort care.   No further nutrition interventions planned at this time. Please re-consult as needed.   Doneta Furbish RD, LDN Clinical Dietitian

## 2024-02-11 DIAGNOSIS — M009 Pyogenic arthritis, unspecified: Secondary | ICD-10-CM

## 2024-02-11 DIAGNOSIS — M4628 Osteomyelitis of vertebra, sacral and sacrococcygeal region: Secondary | ICD-10-CM | POA: Diagnosis not present

## 2024-02-11 NOTE — Plan of Care (Signed)
  Problem: Health Behavior/Discharge Planning: Goal: Ability to manage health-related needs will improve Outcome: Progressing   Problem: Clinical Measurements: Goal: Ability to maintain clinical measurements within normal limits will improve Outcome: Progressing Goal: Will remain free from infection Outcome: Progressing Goal: Diagnostic test results will improve Outcome: Progressing Goal: Respiratory complications will improve Outcome: Progressing Goal: Cardiovascular complication will be avoided Outcome: Progressing   Problem: Activity: Goal: Risk for activity intolerance will decrease Outcome: Progressing   Problem: Nutrition: Goal: Adequate nutrition will be maintained Outcome: Progressing   Problem: Coping: Goal: Level of anxiety will decrease Outcome: Progressing   Problem: Elimination: Goal: Will not experience complications related to bowel motility Outcome: Progressing Goal: Will not experience complications related to urinary retention Outcome: Progressing   Problem: Pain Managment: Goal: General experience of comfort will improve and/or be controlled Outcome: Progressing   Problem: Safety: Goal: Ability to remain free from injury will improve Outcome: Progressing   Problem: Skin Integrity: Goal: Risk for impaired skin integrity will decrease Outcome: Progressing   Problem: Fluid Volume: Goal: Hemodynamic stability will improve Outcome: Progressing   Problem: Clinical Measurements: Goal: Diagnostic test results will improve Outcome: Progressing Goal: Signs and symptoms of infection will decrease Outcome: Progressing   Problem: Respiratory: Goal: Ability to maintain adequate ventilation will improve Outcome: Progressing   Problem: Urinary Elimination: Goal: Signs and symptoms of infection will decrease Outcome: Progressing   Problem: Education: Goal: Knowledge of the prescribed therapeutic regimen will improve Outcome: Progressing   Problem:  Coping: Goal: Ability to identify and develop effective coping behavior will improve Outcome: Progressing   Problem: Clinical Measurements: Goal: Quality of life will improve Outcome: Progressing   Problem: Respiratory: Goal: Verbalizations of increased ease of respirations will increase Outcome: Progressing   Problem: Role Relationship: Goal: Family's ability to cope with current situation will improve Outcome: Progressing Goal: Ability to verbalize concerns, feelings, and thoughts to partner or family member will improve Outcome: Progressing   Problem: Pain Management: Goal: Satisfaction with pain management regimen will improve Outcome: Progressing

## 2024-02-11 NOTE — Progress Notes (Signed)
 Palliative Medicine Progress Note   Patient Name: Rochelle Wajda       Date: 02/11/2024 DOB: [birth year]  Age: 58 y.o. MRN#: [id number] Attending Physician: Modena Andes, MD Primary Care Physician: [name], MD Admit Date: 01/31/2024   HPI/Patient Profile: 58 y.o. male  with past medical history of spina bifida, neurogenic bladder with urostomy and colostomy, chronic sacral decubitus, chronic osteomyelitis of ischial tuberosity and inferior pubic ramus, anemia, and HTN.  He was recently hospitalized 3/21 - 3/28 for septic shock in the setting of necrotizing soft tissue infection and sacral decubitus.  He presented to the ED on 01/31/2024 with generalized weakness and was admitted with acute on chronic osteomyelitis, acute on chronic anemia, AKI, and hypokalemia.  Hospitalization complicated by confusion/delirium.   Palliative Medicine has been consulted for goals of care.    Subjective: Medical records reviewed including progress notes, labs, imaging, MAR. Patient received 1 dose of PRN Robinul, 1 dose of PRN IV morphine and 1 dose of PRN zofran  in the past 24 hours. Patient assessed at the bedside. He is resting comfortably with no signs of pain or distress. His family is present visiting.  Patient's family noted some difficulty with visitor entry into the hospital today and I confirmed unrestricted visitation status with unit Diplomatic Services operational officer. They feel PRN medications are effectiveness and understand option of scheduling or increasing dosages as needed for comfort. No other needs at this time.  Questions and concerns addressed. PMT will continue to support holistically.   Objective:  Physical Exam Vitals and nursing note reviewed.  Constitutional:      General: He is not in acute distress.    Appearance: He is  ill-appearing.  Pulmonary:     Effort: No respiratory distress.  Neurological:     Mental Status: He is unresponsive.           Palliative Medicine Assessment & Plan   Assessment: Principal Problem:   Acute on chronic osteomyelitis of sacrum (HCC) Active Problems:   Chronic hypokalemia   Essential hypertension   Acute kidney injury superimposed on chronic kidney disease (HCC)   Acute anemia   Sepsis (HCC)   Metabolic acidosis   History of spina bifida   Colostomy in place [facility])   History of urostomy   Sacral decubitus ulcer, stage IV (HCC)   Microcytic anemia   CKD (chronic kidney disease) stage 2, GFR 60-89 ml/min   Chronic hypotension   Generalized anxiety disorder   DNR (do not resuscitate)    Recommendations/Plan: Continue comfort care, no adjustment required to IV medications today. Continue PRN as stated  below PMT will continue to follow and support  Symptom Management:  Morphine prn for pain or dyspnea Lorazepam  (ATIVAN ) prn for anxiety Haloperidol (HALDOL) prn for agitation  Glycopyrrolate (ROBINUL) for excessive secretions Ondansetron  (ZOFRAN ) prn for nausea Polyvinyl alcohol (LIQUIFILM TEARS) prn for dry eyes Antiseptic oral rinse (BIOTENE) prn for dry mouth   Code Status: DNR - comfort   Prognosis:  Days  Discharge Planning: Anticipated Hospital Death   Thank you for allowing the Palliative Medicine Team to assist in the care of this patient.   MDM - High  Detailed review of medical records (labs, imaging, vital signs), medically appropriate exam, discussed with treatment team, counseling and education to patient, family, & staff, documenting clinical information, medication management, coordination of care.    Juliano Mceachin P Reeves Musick, PA-C   Please contact Palliative Medicine Team phone at [phone] for questions and concerns.  For individual providers, please see AMION.

## 2024-02-11 NOTE — Progress Notes (Signed)
 PROGRESS NOTE    [name]  EAV:[id number] DOB: [birth year] DOA: 01/31/2024 PCP: [name], MD   Brief Narrative:  58 year old M with PMH of spina bifid/neurogenic bladder with urostomy and colostomy, chronic sacral decubitus, chronic osteomyelitis of ischial tuberosity, inferior pubic ramus, HTN, anemia and recent hospitalization from 3/21-3/28 for septic shock in the setting of necrotizing soft tissue infection and sacral decubitus for which she was treated with vasopressor, broad-spectrum antibiotics and discharged on p.o. Augmentin  for 14 weeks per recommendation by ID.     Patient presents with generalized weakness.  Workup in ED concerning for acute on chronic osteomyelitis, acute on chronic anemia, AKI and hypokalemia.  He had mild leukocytosis to 14.4.  Hgb dropped from 8.3 (baseline) to 5.9 and 5.5 on repeat.  Cr 1.98 (was 1.0 on 3/27).  K2.2.  CT abdomen and pelvis concerning bilateral deep gluteal ulcers with acute on chronic pelvic osteomyelitis and progressive left hip septic joint.  Blood cultures obtained.  Started on broad-spectrum antibiotics and potassium supplementation.  2 units of blood ordered.  Orthopedic surgery consulted   The next day, transfused 2 units and hemoglobin improved to 11.5.  Evaluated by orthopedic surgery who did not feel there is need for surgical intervention.  ID initially recommended cefepime , oral Zyvox  and Flagyl  until 5/15 followed by Augmentin  for 4 weeks.   Hospital course complicated by effusion/delirium.  Cefepime  changed to ertapenem .  Flagyl  discontinued.  Ammonia, TSH and B12 within normal.  CT head and MRI brain with hydrocephalus and Chiari malformation features unchanged from prior CT in 2009.  Intermittent seizures since 5/5.  Now on Keppra  and Vimpat  and LTM EEG.   After discussion with patient's sisters, palliative consulted.  Goal status changed to DNR on 5/7.  Patient eventually transitioned to comfort care on the  afternoon of 02/10/2024.  Assessment & Plan:   Principal Problem:   Acute on chronic osteomyelitis of sacrum (HCC) Active Problems:   Chronic hypokalemia   Acute kidney injury superimposed on chronic kidney disease (HCC)   Acute anemia   Sepsis (HCC)   Metabolic acidosis   Chronic hypotension   Essential hypertension   History of spina bifida   Colostomy in place [facility])   History of urostomy   Sacral decubitus ulcer, stage IV (HCC)   Microcytic anemia   CKD (chronic kidney disease) stage 2, GFR 60-89 ml/min   Generalized anxiety disorder   DNR (do not resuscitate)  Sepsis due to acute on chronic osteomyelitis of the ischial tuberosity/inferior pubic ramus Left hip septic joint Spina bifida with sacral decubitus ulcer stage IV -Recently hospitalized for septic shock and discharged on Augmentin  for 14 weeks -CT-b/l deep gluteal ulcers with acute on chronic pelvic osteo and progressive left hip septic joint.  . -CRP 25.  ESR 140.  Blood cultures NGTD. -Per Ortho, no good surgical remedy -IR consulted for drainage of left hip but recommended treating based on positive culture on 3/21 -Ertapenem  and linezolid  until 5/15 followed by 4 weeks of Augmentin  per ID. -PICC line placed on 5/2.  GOC discussion as below.   Acute encephalopathy/delirium/seizure activity:  CT head and MRI brain with hydrocephalus and Chiari malformation which seems to be chronic.  Intermittent seizure since 5/5.  Captured on EEG the night of 5/5.  Another brief episode the morning of 5/7.  Status post LTM EEG, Keppra  and Vimpat  per neurology -Ativan  as needed for breakthrough seizure -Seizure precaution, n.p.o..  GOC disc patient as below.  Oropharyngeal dysphagia in the setting of encephalopathy -Changed essential meds to IV -Family not interested in tube feed risk and benefit discussion.  GOC patient as below.   Acute on chronic anemia: Hgb dropped from 8.3-5.5.  Likely dilutional from IV fluid  resuscitation.  No overt bleeding.  Hgb stable after 2 units.  GOC discussion as below   AKI: Resolved   Chronic hypotension: On midodrine  at home.  Cortisol within normal.  TSH slightly low but free T4 normal.  Normal TTE in 2017.  Hypotension seems to have resolved.  Flomax  discontinued.   Diminished hearing/earwax in right ear: Left mastoid/middle ear effusion on CT. -Antibiotics as above -Debrox 3 times daily   Prolonged QTc: QTc 599 in the setting of severe hypokalemia.  Improved to 470.   Hypokalemia/hypomagnesemia/hypophosphatemia:  - Resolved  Ventriculomegaly/Chiari malformation-chronic. History of spina bifida with a stage IV ulcer, neurogenic bladder, urostomy and colostomy -Continue wound and ostomy care -Discontinue Flomax .  Does not help with neurogenic bladder   Non-anion gap metabolic acidosis: Likely due to renal failure.  Resolved.   Advance care planning: Extensive discussion by previous hospitalist Dr. Mae Schlossman with patient's sisters on 5/6 and 5/7.  Now DNR-Limited.  Palliative care engaged.  They were following daily.  Eventually patient was transitioned to comfort care on the afternoon of 02/10/2024.  Palliative following and we appreciate their help.  Patient appears very comfortable.  DVT prophylaxis:    Code Status: Do not attempt resuscitation (DNR) - Comfort care  Family Communication: Nephew and 2 sisters present at bedside.   Status is: Inpatient Remains inpatient appropriate because: Patient will be transitioned to comfort care today.   Estimated body mass index is 24.56 kg/m as calculated from the following:   Height as of this encounter: 4\' 6"  (1.372 m).   Weight as of this encounter: 46.2 kg.  Pressure Injury 02/01/24 Buttocks Right;Left Unstageable - Full thickness tissue loss in which the base of the injury is covered by slough (yellow, tan, gray, green or brown) and/or eschar (tan, brown or black) in the wound bed. (Active)  02/01/24 1148   Location: Buttocks  Location Orientation: Right;Left  Staging: Unstageable - Full thickness tissue loss in which the base of the injury is covered by slough (yellow, tan, gray, green or brown) and/or eschar (tan, brown or black) in the wound bed.  Wound Description (Comments):   Present on Admission: Yes  Dressing Type Gauze (Comment);ABD 02/08/24 2035   Nutritional Assessment: Body mass index is 24.56 kg/m.Aaron Aas Seen by dietician.  I agree with the assessment and plan as outlined below: Nutrition Status: Nutrition Problem: Increased nutrient needs Etiology: wound healing Signs/Symptoms: estimated needs Interventions: MVI, Juven  . Skin Assessment: I have examined the patient's skin and I agree with the wound assessment as performed by the wound care RN as outlined below: Pressure Injury 02/01/24 Buttocks Right;Left Unstageable - Full thickness tissue loss in which the base of the injury is covered by slough (yellow, tan, gray, green or brown) and/or eschar (tan, brown or black) in the wound bed. (Active)  02/01/24 1148  Location: Buttocks  Location Orientation: Right;Left  Staging: Unstageable - Full thickness tissue loss in which the base of the injury is covered by slough (yellow, tan, gray, green or brown) and/or eschar (tan, brown or black) in the wound bed.  Wound Description (Comments):   Present on Admission: Yes  Dressing Type Gauze (Comment);ABD 02/08/24 2035    Consultants:  Orthopedics ID and palliative care  Procedures:  As above  Antimicrobials:  Anti-infectives (From admission, onward)    Start     Dose/Rate Route Frequency Ordered Stop   02/05/24 1045  linezolid  (ZYVOX ) IVPB 600 mg  Status:  Discontinued        600 mg 300 mL/hr over 60 Minutes Intravenous Every 12 hours 02/05/24 0945 02/10/24 1725   02/05/24 0045  linezolid  (ZYVOX ) IVPB 600 mg        600 mg 300 mL/hr over 60 Minutes Intravenous  Once 02/04/24 2349 02/05/24 0933   02/04/24 1700  ertapenem   (INVANZ ) 1 g in sodium chloride  0.9 % 100 mL IVPB  Status:  Discontinued        1 g 200 mL/hr over 30 Minutes Intravenous Daily 02/04/24 1600 02/11/24 0813   02/02/24 2200  ceFEPIme  (MAXIPIME ) 2 g in sodium chloride  0.9 % 100 mL IVPB  Status:  Discontinued        2 g 200 mL/hr over 30 Minutes Intravenous Every 12 hours 02/02/24 1600 02/04/24 1600   02/02/24 2200  metroNIDAZOLE  (FLAGYL ) tablet 500 mg  Status:  Discontinued        500 mg Oral Every 12 hours 02/02/24 1600 02/04/24 1600   02/02/24 2200  linezolid  (ZYVOX ) tablet 600 mg  Status:  Discontinued        600 mg Oral Every 12 hours 02/02/24 1600 02/05/24 0945   02/01/24 1815  DAPTOmycin  (CUBICIN ) 350 mg in sodium chloride  0.9 % IVPB  Status:  Discontinued        8 mg/kg  46 kg 114 mL/hr over 30 Minutes Intravenous Daily 02/01/24 1719 02/02/24 1600   02/01/24 0700  piperacillin -tazobactam (ZOSYN ) IVPB 3.375 g  Status:  Discontinued        3.375 g 12.5 mL/hr over 240 Minutes Intravenous Every 8 hours 02/01/24 0254 02/02/24 1600   02/01/24 0348  vancomycin  variable dose per unstable renal function (pharmacist dosing)  Status:  Discontinued         Does not apply See admin instructions 02/01/24 0348 02/01/24 1657   02/01/24 0015  vancomycin  (VANCOCIN ) IVPB 1000 mg/200 mL premix        1,000 mg 200 mL/hr over 60 Minutes Intravenous  Once 02/01/24 0010 02/01/24 0242   02/01/24 0015  piperacillin -tazobactam (ZOSYN ) IVPB 3.375 g        3.375 g 100 mL/hr over 30 Minutes Intravenous  Once 02/01/24 0010 02/01/24 0148         Subjective: Patient seen and examined.  Sister at the bedside.  Patient very comfortable and sleepy.  Objective: Vitals:   02/10/24 0759 02/10/24 1217 02/10/24 1950 02/11/24 0641  BP: 99/69 96/77 118/79   Pulse:   97 (!) 135  Resp:   19 (!) 27  Temp: 99 F (37.2 C) 99.4 F (37.4 C) 99 F (37.2 C)   TempSrc: Axillary Axillary Axillary   SpO2:   99% 97%  Weight:      Height:        Intake/Output Summary  (Last 24 hours) at 02/11/2024 1039 Last data filed at 02/11/2024 0645 Gross per 24 hour  Intake 2135.97 ml  Output 880 ml  Net 1255.97 ml   Filed Weights   01/31/24 2146 02/02/24 0408  Weight: 46 kg 46.2 kg    Examination:  General exam: Appears calm and comfortable  Respiratory system: Clear to auscultation. Respiratory effort normal. Cardiovascular system: S1 & S2 heard, RRR. No JVD, murmurs, rubs, gallops or clicks. No pedal edema. Gastrointestinal  system: Abdomen is nondistended, soft and nontender. No organomegaly or masses felt. Normal bowel sounds heard.   Data Reviewed: I have personally reviewed following labs and imaging studies  CBC: Recent Labs  Lab 02/06/24 0251 02/07/24 0532 02/07/24 0938 02/08/24 0500 02/09/24 0500 02/10/24 0459  WBC 8.8 7.0  --  8.1 8.3 8.4  HGB 10.5* 10.0* 10.5* 10.6* 9.8* 11.3*  HCT 35.4* 34.1* 31.0* 35.6* 32.7* 37.2*  MCV 86.6 86.3  --  84.0 86.1 84.5  PLT 466* 433*  --  442* 364 377   Basic Metabolic Panel: Recent Labs  Lab 02/06/24 0251 02/07/24 0532 02/07/24 0938 02/08/24 0500 02/08/24 0953 02/08/24 1821 02/09/24 0500 02/10/24 0459  NA 140 144 147*  --  148*  149* 142 140 138  K 3.9 3.6 3.6  --  2.8*  2.8* 4.0 4.0 3.6  CL 121* 122*  --   --  114*  112* 110 109 103  CO2 14* 16*  --   --  26  26 24 22 25   GLUCOSE 175* 54*  --   --  88  89 78 77 89  BUN 17 13  --   --  7  8 5* 6 5*  CREATININE 1.52* 1.38*  --   --  1.26*  1.50* 1.12 1.00 1.03  CALCIUM  7.9* 8.1*  --   --  8.0*  8.0* 7.6* 7.7* 8.0*  MG 2.2 2.2  --  2.0  --   --  1.6* 2.2  PHOS 2.1* 2.4*  --   --  1.9* 1.5* 1.4*  1.4* 2.8   GFR: Estimated Creatinine Clearance: 45.5 mL/min (by C-G formula based on SCr of 1.03 mg/dL). Liver Function Tests: Recent Labs  Lab 02/06/24 0251 02/07/24 0532 02/08/24 0953 02/08/24 1821 02/09/24 0500 02/10/24 0459  AST 16 15 16   --   --   --   ALT 12 10 10   --   --   --   ALKPHOS 64 61 71  --   --   --   BILITOT 0.3  0.6 0.5  --   --   --   PROT 5.0* 5.3* 5.2*  --   --   --   ALBUMIN  <1.5* 2.6* 2.1*  2.1* 1.9* 1.8* 1.9*   No results for input(s): "LIPASE", "AMYLASE" in the last 168 hours. Recent Labs  Lab 02/05/24 0320  AMMONIA 23   Coagulation Profile: No results for input(s): "INR", "PROTIME" in the last 168 hours. Cardiac Enzymes: No results for input(s): "CKTOTAL", "CKMB", "CKMBINDEX", "TROPONINI" in the last 168 hours. BNP (last 3 results) No results for input(s): "PROBNP" in the last 8760 hours. HbA1C: No results for input(s): "HGBA1C" in the last 72 hours. CBG: Recent Labs  Lab 02/09/24 0426 02/09/24 1653 02/09/24 2346 02/10/24 0441 02/10/24 1216  GLUCAP 89 77 139* 96 141*   Lipid Profile: No results for input(s): "CHOL", "HDL", "LDLCALC", "TRIG", "CHOLHDL", "LDLDIRECT" in the last 72 hours. Thyroid  Function Tests: No results for input(s): "TSH", "T4TOTAL", "FREET4", "T3FREE", "THYROIDAB" in the last 72 hours. Anemia Panel: No results for input(s): "VITAMINB12", "FOLATE", "FERRITIN", "TIBC", "IRON", "RETICCTPCT" in the last 72 hours. Sepsis Labs: Recent Labs  Lab 02/07/24 0532  LATICACIDVEN 1.2    Recent Results (from the past 240 hours)  MRSA Next Gen by PCR, Nasal     Status: None   Collection Time: 02/01/24  2:44 PM   Specimen: Nasal Mucosa; Nasal Swab  Result Value Ref Range Status  MRSA by PCR Next Gen NOT DETECTED NOT DETECTED Final    Comment: (NOTE) The GeneXpert MRSA Assay (FDA approved for NASAL specimens only), is one component of a comprehensive MRSA colonization surveillance program. It is not intended to diagnose MRSA infection nor to guide or monitor treatment for MRSA infections. Test performance is not FDA approved in patients less than 88 years old. Performed at [facility], [address] [place], [place] [address]   MRSA Next Gen by PCR, Nasal     Status: None   Collection Time: 02/07/24  6:38 AM   Specimen: Nasal Mucosa; Nasal Swab   Result Value Ref Range Status   MRSA by PCR Next Gen NOT DETECTED NOT DETECTED Final    Comment: (NOTE) The GeneXpert MRSA Assay (FDA approved for NASAL specimens only), is one component of a comprehensive MRSA colonization surveillance program. It is not intended to diagnose MRSA infection nor to guide or monitor treatment for MRSA infections. Test performance is not FDA approved in patients less than 51 years old. Performed at [facility], [address] [place], [place] [address]      Radiology Studies: No results found.   Scheduled Meds:  Chlorhexidine  Gluconate Cloth  6 each Topical Daily   levETIRAcetam   1,000 mg Intravenous Q12H   sodium chloride  flush  3 mL Intravenous Q12H   Continuous Infusions:  lacosamide  (VIMPAT ) IV 150 mg (02/10/24 2214)     LOS: 10 days   Modena Andes, MD [facility]  02/11/2024, 10:39 AM   *Please note that this is a verbal dictation therefore any spelling or grammatical errors are due to the "[employer]agon [employer] One" system interpretation.  Please page via Amion and do not message via secure chat for urgent patient care matters. Secure chat can be used for non urgent patient care matters.  How to contact the [facility] Attending or Consulting provider 7A - 7P or covering provider during after hours 7P -7A, for this patient?  Check the care team in [facility] and look for a) attending/consulting [facility] provider listed and b) the [facility] team listed. Page or secure chat 7A-7P. Log into [email or web address] and use [facility] universal password to access. If you do not have the password, please contact the hospital operator. Locate the [facility] provider you are looking for under [facility] and page to a number that you can be directly reached. If you still have difficulty reaching the provider, please page the [facility] (Director on Call) for the Hospitalists listed on amion for assistance.

## 2024-02-12 DIAGNOSIS — M4628 Osteomyelitis of vertebra, sacral and sacrococcygeal region: Secondary | ICD-10-CM | POA: Diagnosis not present

## 2024-02-12 NOTE — Plan of Care (Signed)
  Problem: Health Behavior/Discharge Planning: Goal: Ability to manage health-related needs will improve Outcome: Progressing   Problem: Clinical Measurements: Goal: Ability to maintain clinical measurements within normal limits will improve Outcome: Progressing Goal: Will remain free from infection Outcome: Progressing Goal: Diagnostic test results will improve Outcome: Progressing Goal: Respiratory complications will improve Outcome: Progressing Goal: Cardiovascular complication will be avoided Outcome: Progressing   Problem: Activity: Goal: Risk for activity intolerance will decrease Outcome: Progressing   Problem: Nutrition: Goal: Adequate nutrition will be maintained Outcome: Progressing   Problem: Coping: Goal: Level of anxiety will decrease Outcome: Progressing   Problem: Elimination: Goal: Will not experience complications related to bowel motility Outcome: Progressing Goal: Will not experience complications related to urinary retention Outcome: Progressing   Problem: Pain Managment: Goal: General experience of comfort will improve and/or be controlled Outcome: Progressing   Problem: Safety: Goal: Ability to remain free from injury will improve Outcome: Progressing   Problem: Skin Integrity: Goal: Risk for impaired skin integrity will decrease Outcome: Progressing   Problem: Fluid Volume: Goal: Hemodynamic stability will improve Outcome: Progressing   Problem: Clinical Measurements: Goal: Diagnostic test results will improve Outcome: Progressing Goal: Signs and symptoms of infection will decrease Outcome: Progressing   Problem: Respiratory: Goal: Ability to maintain adequate ventilation will improve Outcome: Progressing   Problem: Urinary Elimination: Goal: Signs and symptoms of infection will decrease Outcome: Progressing   Problem: Education: Goal: Knowledge of the prescribed therapeutic regimen will improve Outcome: Progressing   Problem:  Coping: Goal: Ability to identify and develop effective coping behavior will improve Outcome: Progressing   Problem: Clinical Measurements: Goal: Quality of life will improve Outcome: Progressing   Problem: Respiratory: Goal: Verbalizations of increased ease of respirations will increase Outcome: Progressing   Problem: Role Relationship: Goal: Family's ability to cope with current situation will improve Outcome: Progressing Goal: Ability to verbalize concerns, feelings, and thoughts to partner or family member will improve Outcome: Progressing   Problem: Pain Management: Goal: Satisfaction with pain management regimen will improve Outcome: Progressing

## 2024-02-12 NOTE — Progress Notes (Signed)
 Palliative Medicine Progress Note   Patient Name: [name]       Date: 02/12/2024 DOB: December 05, 1965  Age: 58 y.o. MRN#: [id number] Attending Physician: Modena Andes, MD Primary Care Physician: [name], MD Admit Date: 01/31/2024   HPI/Patient Profile: 58 y.o. male  with past medical history of spina bifida, neurogenic bladder with urostomy and colostomy, chronic sacral decubitus, chronic osteomyelitis of ischial tuberosity and inferior pubic ramus, anemia, and HTN.  He was recently hospitalized 3/21 - 3/28 for septic shock in the setting of necrotizing soft tissue infection and sacral decubitus.  He presented to the ED on 01/31/2024 with generalized weakness and was admitted with acute on chronic osteomyelitis, acute on chronic anemia, AKI, and hypokalemia.  Hospitalization complicated by confusion/delirium.   Palliative Medicine has been consulted for goals of care.   Subjective: Medical records reviewed including progress notes, labs, imaging, MAR. Patient has not received PRN comfort medications in the past 24 hours. Patient assessed at the bedside. He is resting comfortably with no signs of pain or distress, occasionally opens his eyes though unresponsive. His sister is present visiting.  Emotional support and therapeutic listening was provided as patient's sister shared the journey patient has experienced until this point and the relationship they have had since childhood.  She is trying to balance taking care of herself and being here for the patient, she does not want him to pass along.  We discussed prognosis of likely days.  She mentioned a brief moment or 2 of cough/wetness overnight without improvement after suction.  Education was provided on the option of Robinul as needed.  Questions and concerns addressed.  PMT will continue to support holistically.   Objective:  Physical Exam Vitals and nursing note reviewed.  Constitutional:      General: He is not in acute distress.    Appearance: He is ill-appearing.  Pulmonary:     Effort: Pulmonary effort is normal. No respiratory distress.  Neurological:     Mental Status: He is unresponsive.           Palliative Medicine Assessment & Plan   Assessment: Principal Problem:   Acute on chronic osteomyelitis of sacrum (HCC) Active Problems:   Chronic hypokalemia   Essential hypertension   Acute kidney injury superimposed on chronic kidney disease (HCC)   Acute anemia   Sepsis (HCC)   Metabolic acidosis   History of spina bifida   Colostomy in place [facility])   History of urostomy   Sacral decubitus ulcer, stage IV (HCC)   Microcytic anemia   CKD (chronic kidney disease) stage 2,  GFR 60-89 ml/min   Chronic hypotension   Generalized anxiety disorder   DNR (do not resuscitate) End-of-life care  Recommendations/Plan: Continue comfort care, no adjustment required to IV medications today. Continue PRN as stated below Psychosocial and emotional support provided PMT will continue to follow and support  Symptom Management:  Morphine prn for pain or dyspnea Lorazepam  (ATIVAN ) prn for anxiety Haloperidol (HALDOL) prn for agitation  Glycopyrrolate (ROBINUL) for excessive secretions Ondansetron  (ZOFRAN ) prn for nausea Polyvinyl alcohol (LIQUIFILM TEARS) prn for dry eyes Antiseptic oral rinse (BIOTENE) prn for dry mouth   Code Status: DNR - comfort   Prognosis:  Days  Discharge Planning: Anticipated Hospital Death   Thank you for allowing the Palliative Medicine Team to assist in the care of this patient.   MDM - High  Detailed review of medical records (labs, imaging, vital signs), medically appropriate exam, discussed with treatment team, counseling and education to patient, family, & staff, documenting clinical information,  medication management, coordination of care.    Ossie Beltran P Jacobey Gura, PA-C   Please contact Palliative Medicine Team phone at [phone] for questions and concerns.  For individual providers, please see AMION.

## 2024-02-12 NOTE — Progress Notes (Signed)
 PROGRESS NOTE    [name]  UJW:[id number] DOB: [birth year] DOA: 01/31/2024 PCP: [name], MD   Brief Narrative:  58 year old M with PMH of spina bifid/neurogenic bladder with urostomy and colostomy, chronic sacral decubitus, chronic osteomyelitis of ischial tuberosity, inferior pubic ramus, HTN, anemia and recent hospitalization from 3/21-3/28 for septic shock in the setting of necrotizing soft tissue infection and sacral decubitus for which she was treated with vasopressor, broad-spectrum antibiotics and discharged on p.o. Augmentin  for 14 weeks per recommendation by ID.     Patient presents with generalized weakness.  Workup in ED concerning for acute on chronic osteomyelitis, acute on chronic anemia, AKI and hypokalemia.  He had mild leukocytosis to 14.4.  Hgb dropped from 8.3 (baseline) to 5.9 and 5.5 on repeat.  Cr 1.98 (was 1.0 on 3/27).  K2.2.  CT abdomen and pelvis concerning bilateral deep gluteal ulcers with acute on chronic pelvic osteomyelitis and progressive left hip septic joint.  Blood cultures obtained.  Started on broad-spectrum antibiotics and potassium supplementation.  2 units of blood ordered.  Orthopedic surgery consulted   The next day, transfused 2 units and hemoglobin improved to 11.5.  Evaluated by orthopedic surgery who did not feel there is need for surgical intervention.  ID initially recommended cefepime , oral Zyvox  and Flagyl  until 5/15 followed by Augmentin  for 4 weeks.   Hospital course complicated by effusion/delirium.  Cefepime  changed to ertapenem .  Flagyl  discontinued.  Ammonia, TSH and B12 within normal.  CT head and MRI brain with hydrocephalus and Chiari malformation features unchanged from prior CT in 2009.  Intermittent seizures since 5/5.  Now on Keppra  and Vimpat  and LTM EEG.   After discussion with patient's sisters, palliative consulted.  Goal status changed to DNR on 5/7.  Patient eventually transitioned to comfort care on the  afternoon of 02/10/2024.  Assessment & Plan:   Principal Problem:   Acute on chronic osteomyelitis of sacrum (HCC) Active Problems:   Chronic hypokalemia   Acute kidney injury superimposed on chronic kidney disease (HCC)   Acute anemia   Sepsis (HCC)   Metabolic acidosis   Chronic hypotension   Essential hypertension   History of spina bifida   Colostomy in place [facility])   History of urostomy   Sacral decubitus ulcer, stage IV (HCC)   Microcytic anemia   CKD (chronic kidney disease) stage 2, GFR 60-89 ml/min   Generalized anxiety disorder   DNR (do not resuscitate)  Sepsis due to acute on chronic osteomyelitis of the ischial tuberosity/inferior pubic ramus Left hip septic joint Spina bifida with sacral decubitus ulcer stage IV -Recently hospitalized for septic shock and discharged on Augmentin  for 14 weeks -CT-b/l deep gluteal ulcers with acute on chronic pelvic osteo and progressive left hip septic joint.  . -CRP 25.  ESR 140.  Blood cultures NGTD. -Per Ortho, no good surgical remedy -IR consulted for drainage of left hip but recommended treating based on positive culture on 3/21 -Ertapenem  and linezolid  until 5/15 followed by 4 weeks of Augmentin  per ID. -PICC line placed on 5/2.  GOC discussion as below.   Acute encephalopathy/delirium/seizure activity:  CT head and MRI brain with hydrocephalus and Chiari malformation which seems to be chronic.  Intermittent seizure since 5/5.  Captured on EEG the night of 5/5.  Another brief episode the morning of 5/7.  Status post LTM EEG, Keppra  and Vimpat  per neurology -Ativan  as needed for breakthrough seizure -Seizure precaution, n.p.o..  GOC disc patient as below.  Oropharyngeal dysphagia in the setting of encephalopathy -Changed essential meds to IV -Family not interested in tube feed risk and benefit discussion.  GOC patient as below.   Acute on chronic anemia: Hgb dropped from 8.3-5.5.  Likely dilutional from IV fluid  resuscitation.  No overt bleeding.  Hgb stable after 2 units.  GOC discussion as below   AKI: Resolved   Chronic hypotension: On midodrine  at home.  Cortisol within normal.  TSH slightly low but free T4 normal.  Normal TTE in 2017.  Hypotension seems to have resolved.  Flomax  discontinued.   Diminished hearing/earwax in right ear: Left mastoid/middle ear effusion on CT. -Antibiotics as above -Debrox 3 times daily   Prolonged QTc: QTc 599 in the setting of severe hypokalemia.  Improved to 470.   Hypokalemia/hypomagnesemia/hypophosphatemia:  - Resolved  Ventriculomegaly/Chiari malformation-chronic. History of spina bifida with a stage IV ulcer, neurogenic bladder, urostomy and colostomy -Continue wound and ostomy care -Discontinue Flomax .  Does not help with neurogenic bladder   Non-anion gap metabolic acidosis: Likely due to renal failure.  Resolved.   Advance care planning: Extensive discussion by previous hospitalist Dr. Mae Schlossman with patient's sisters on 5/6 and 5/7.  Now DNR-Limited.  Palliative care engaged.  They were following daily.  Eventually patient was transitioned to comfort care on the afternoon of 02/10/2024.  Palliative following and we appreciate their help.  Patient appears very comfortable.  DVT prophylaxis:    Code Status: Do not attempt resuscitation (DNR) - Comfort care  Family Communication: Nephew and 2 sisters present at bedside.   Status is: Inpatient Remains inpatient appropriate because: Anticipated hospital death  Estimated body mass index is 24.56 kg/m as calculated from the following:   Height as of this encounter: 4\' 6"  (1.372 m).   Weight as of this encounter: 46.2 kg.  Pressure Injury 02/01/24 Buttocks Right;Left Unstageable - Full thickness tissue loss in which the base of the injury is covered by slough (yellow, tan, gray, green or brown) and/or eschar (tan, brown or black) in the wound bed. (Active)  02/01/24 1148  Location: Buttocks  Location  Orientation: Right;Left  Staging: Unstageable - Full thickness tissue loss in which the base of the injury is covered by slough (yellow, tan, gray, green or brown) and/or eschar (tan, brown or black) in the wound bed.  Wound Description (Comments):   Present on Admission: Yes  Dressing Type Gauze (Comment) 02/11/24 0720   Nutritional Assessment: Body mass index is 24.56 kg/m.Aaron Aas Seen by dietician.  I agree with the assessment and plan as outlined below: Nutrition Status: Nutrition Problem: Increased nutrient needs Etiology: wound healing Signs/Symptoms: estimated needs Interventions: MVI, Juven  . Skin Assessment: I have examined the patient's skin and I agree with the wound assessment as performed by the wound care RN as outlined below: Pressure Injury 02/01/24 Buttocks Right;Left Unstageable - Full thickness tissue loss in which the base of the injury is covered by slough (yellow, tan, gray, green or brown) and/or eschar (tan, brown or black) in the wound bed. (Active)  02/01/24 1148  Location: Buttocks  Location Orientation: Right;Left  Staging: Unstageable - Full thickness tissue loss in which the base of the injury is covered by slough (yellow, tan, gray, green or brown) and/or eschar (tan, brown or black) in the wound bed.  Wound Description (Comments):   Present on Admission: Yes  Dressing Type Gauze (Comment) 02/11/24 0720    Consultants:  Orthopedics ID and palliative care  Procedures:  As above  Antimicrobials:  Anti-infectives (From admission, onward)    Start     Dose/Rate Route Frequency Ordered Stop   02/05/24 1045  linezolid  (ZYVOX ) IVPB 600 mg  Status:  Discontinued        600 mg 300 mL/hr over 60 Minutes Intravenous Every 12 hours 02/05/24 0945 02/10/24 1725   02/05/24 0045  linezolid  (ZYVOX ) IVPB 600 mg        600 mg 300 mL/hr over 60 Minutes Intravenous  Once 02/04/24 2349 02/05/24 0933   02/04/24 1700  ertapenem  (INVANZ ) 1 g in sodium chloride  0.9 % 100  mL IVPB  Status:  Discontinued        1 g 200 mL/hr over 30 Minutes Intravenous Daily 02/04/24 1600 02/11/24 0813   02/02/24 2200  ceFEPIme  (MAXIPIME ) 2 g in sodium chloride  0.9 % 100 mL IVPB  Status:  Discontinued        2 g 200 mL/hr over 30 Minutes Intravenous Every 12 hours 02/02/24 1600 02/04/24 1600   02/02/24 2200  metroNIDAZOLE  (FLAGYL ) tablet 500 mg  Status:  Discontinued        500 mg Oral Every 12 hours 02/02/24 1600 02/04/24 1600   02/02/24 2200  linezolid  (ZYVOX ) tablet 600 mg  Status:  Discontinued        600 mg Oral Every 12 hours 02/02/24 1600 02/05/24 0945   02/01/24 1815  DAPTOmycin  (CUBICIN ) 350 mg in sodium chloride  0.9 % IVPB  Status:  Discontinued        8 mg/kg  46 kg 114 mL/hr over 30 Minutes Intravenous Daily 02/01/24 1719 02/02/24 1600   02/01/24 0700  piperacillin -tazobactam (ZOSYN ) IVPB 3.375 g  Status:  Discontinued        3.375 g 12.5 mL/hr over 240 Minutes Intravenous Every 8 hours 02/01/24 0254 02/02/24 1600   02/01/24 0348  vancomycin  variable dose per unstable renal function (pharmacist dosing)  Status:  Discontinued         Does not apply See admin instructions 02/01/24 0348 02/01/24 1657   02/01/24 0015  vancomycin  (VANCOCIN ) IVPB 1000 mg/200 mL premix        1,000 mg 200 mL/hr over 60 Minutes Intravenous  Once 02/01/24 0010 02/01/24 0242   02/01/24 0015  piperacillin -tazobactam (ZOSYN ) IVPB 3.375 g        3.375 g 100 mL/hr over 30 Minutes Intravenous  Once 02/01/24 0010 02/01/24 0148         Subjective: Seen and examined, patient very comfortable.  Sister at the bedside. Objective: Vitals:   02/10/24 1217 02/10/24 1950 02/11/24 0641 02/11/24 2036  BP: 96/77 118/79  115/80  Pulse:  97 (!) 135 (!) 105  Resp:  19 (!) 27 (!) 25  Temp: 99.4 F (37.4 C) 99 F (37.2 C)  98.2 F (36.8 C)  TempSrc: Axillary Axillary  Axillary  SpO2:  99% 97% 91%  Weight:      Height:       No intake or output data in the 24 hours ending 02/12/24 1046  Filed  Weights   01/31/24 2146 02/02/24 0408  Weight: 46 kg 46.2 kg    Examination:  General exam: Appears calm and comfortable  Respiratory system: Clear to auscultation. Respiratory effort normal. Cardiovascular system: S1 & S2 heard, RRR. No JVD, murmurs, rubs, gallops or clicks. No pedal edema. Gastrointestinal system: Abdomen is nondistended, soft and nontender. No organomegaly or masses felt. Normal bowel sounds heard.   Data Reviewed: I have personally reviewed following labs and imaging studies  CBC: Recent Labs  Lab 02/06/24 0251 02/07/24 0532 02/07/24 0938 02/08/24 0500 02/09/24 0500 02/10/24 0459  WBC 8.8 7.0  --  8.1 8.3 8.4  HGB 10.5* 10.0* 10.5* 10.6* 9.8* 11.3*  HCT 35.4* 34.1* 31.0* 35.6* 32.7* 37.2*  MCV 86.6 86.3  --  84.0 86.1 84.5  PLT 466* 433*  --  442* 364 377   Basic Metabolic Panel: Recent Labs  Lab 02/06/24 0251 02/07/24 0532 02/07/24 0938 02/08/24 0500 02/08/24 0953 02/08/24 1821 02/09/24 0500 02/10/24 0459  NA 140 144 147*  --  148*  149* 142 140 138  K 3.9 3.6 3.6  --  2.8*  2.8* 4.0 4.0 3.6  CL 121* 122*  --   --  114*  112* 110 109 103  CO2 14* 16*  --   --  26  26 24 22 25   GLUCOSE 175* 54*  --   --  88  89 78 77 89  BUN 17 13  --   --  7  8 5* 6 5*  CREATININE 1.52* 1.38*  --   --  1.26*  1.50* 1.12 1.00 1.03  CALCIUM  7.9* 8.1*  --   --  8.0*  8.0* 7.6* 7.7* 8.0*  MG 2.2 2.2  --  2.0  --   --  1.6* 2.2  PHOS 2.1* 2.4*  --   --  1.9* 1.5* 1.4*  1.4* 2.8   GFR: Estimated Creatinine Clearance: 45.5 mL/min (by C-G formula based on SCr of 1.03 mg/dL). Liver Function Tests: Recent Labs  Lab 02/06/24 0251 02/07/24 0532 02/08/24 0953 02/08/24 1821 02/09/24 0500 02/10/24 0459  AST 16 15 16   --   --   --   ALT 12 10 10   --   --   --   ALKPHOS 64 61 71  --   --   --   BILITOT 0.3 0.6 0.5  --   --   --   PROT 5.0* 5.3* 5.2*  --   --   --   ALBUMIN  <1.5* 2.6* 2.1*  2.1* 1.9* 1.8* 1.9*   No results for input(s): "LIPASE",  "AMYLASE" in the last 168 hours. No results for input(s): "AMMONIA" in the last 168 hours.  Coagulation Profile: No results for input(s): "INR", "PROTIME" in the last 168 hours. Cardiac Enzymes: No results for input(s): "CKTOTAL", "CKMB", "CKMBINDEX", "TROPONINI" in the last 168 hours. BNP (last 3 results) No results for input(s): "PROBNP" in the last 8760 hours. HbA1C: No results for input(s): "HGBA1C" in the last 72 hours. CBG: Recent Labs  Lab 02/09/24 0426 02/09/24 1653 02/09/24 2346 02/10/24 0441 02/10/24 1216  GLUCAP 89 77 139* 96 141*   Lipid Profile: No results for input(s): "CHOL", "HDL", "LDLCALC", "TRIG", "CHOLHDL", "LDLDIRECT" in the last 72 hours. Thyroid  Function Tests: No results for input(s): "TSH", "T4TOTAL", "FREET4", "T3FREE", "THYROIDAB" in the last 72 hours. Anemia Panel: No results for input(s): "VITAMINB12", "FOLATE", "FERRITIN", "TIBC", "IRON", "RETICCTPCT" in the last 72 hours. Sepsis Labs: Recent Labs  Lab 02/07/24 0532  LATICACIDVEN 1.2    Recent Results (from the past 240 hours)  MRSA Next Gen by PCR, Nasal     Status: None   Collection Time: 02/07/24  6:38 AM   Specimen: Nasal Mucosa; Nasal Swab  Result Value Ref Range Status   MRSA by PCR Next Gen NOT DETECTED NOT DETECTED Final    Comment: (NOTE) The GeneXpert MRSA Assay (FDA approved for NASAL specimens only), is one component of a  comprehensive MRSA colonization surveillance program. It is not intended to diagnose MRSA infection nor to guide or monitor treatment for MRSA infections. Test performance is not FDA approved in patients less than 68 years old. Performed at [facility], [address], [place], [place] [address]      Radiology Studies: No results found.   Scheduled Meds:  Chlorhexidine  Gluconate Cloth  6 each Topical Daily   levETIRAcetam   1,000 mg Intravenous Q12H   sodium chloride  flush  3 mL Intravenous Q12H   Continuous Infusions:  lacosamide  (VIMPAT )  IV 150 mg (02/12/24 0927)     LOS: 11 days   Modena Andes, MD [facility]  02/12/2024, 10:46 AM   *Please note that this is a verbal dictation therefore any spelling or grammatical errors are due to the "Dragon Medical One" system interpretation.  Please page via Amion and do not message via secure chat for urgent patient care matters. Secure chat can be used for non urgent patient care matters.  How to contact the [facility] Attending or Consulting provider 7A - 7P or covering provider during after hours 7P -7A, for this patient?  Check the care team in [facility] and look for a) attending/consulting [facility] provider listed and b) the [facility] team listed. Page or secure chat 7A-7P. Log into [email or web address] and use [place]'s universal password to access. If you do not have the password, please contact the hospital operator. Locate the [facility] provider you are looking for under [facility] and page to a number that you can be directly reached. If you still have difficulty reaching the provider, please page the [facility] (Director on Call) for the Hospitalists listed on amion for assistance.

## 2024-02-13 DIAGNOSIS — M4628 Osteomyelitis of vertebra, sacral and sacrococcygeal region: Secondary | ICD-10-CM | POA: Diagnosis not present

## 2024-02-13 MED ORDER — SODIUM CHLORIDE 0.9 % IV SOLN: 150.0000 mg | Freq: Two times a day (BID) | INTRAVENOUS | Status: DC

## 2024-02-13 MED ORDER — LEVETIRACETAM (KEPPRA) 500 MG/5 ML ADULT IV PUSH: 1000.0000 mg | Freq: Two times a day (BID) | INTRAVENOUS | Status: DC

## 2024-02-13 NOTE — TOC Progression Note (Signed)
 Transition of Care [facility]) - Progression Note    Patient Details  Name: [name] MRN: [id number] Date of Birth: [birth year]  [facility] CM/SW Contact  Carmon Christen, LCSWA Phone Number: 02/13/2024, 3:00 PM  Clinical Narrative:     CSW received consult patients family requesting referral to hospice in [place]. CSW called and spoke with patients sister [name] who is agreeable for CSW to make referral to [facility] for residential hospice in [place]. CSW made referral to Shawn P. With [facility]. CSW will continue to follow.       Expected Discharge Plan and Services                                               Social Determinants of Health (SDOH) Interventions SDOH Screenings   Food Insecurity: No Food Insecurity (02/01/2024)  Housing: Low Risk  (02/01/2024)  Transportation Needs: No Transportation Needs (02/01/2024)  Utilities: Not At Risk (02/01/2024)  Depression (PHQ2-9): Low Risk  (12/09/2021)  Financial Resource Strain: Low Risk  (07/29/2023)   Received from [facility]  Tobacco Use: Low Risk  (02/01/2024)    Readmission Risk Interventions    02/03/2024    3:49 PM 08/10/2023    3:37 PM  Readmission Risk Prevention Plan  Transportation Screening Complete Complete  PCP or Specialist Appt within 3-5 Days Complete Complete  HRI or Home Care Consult Complete   Social Work Consult for Recovery Care Planning/Counseling Complete Complete  Palliative Care Screening Not Applicable Not Applicable  Medication Review Oceanographer) Referral to Pharmacy Complete

## 2024-02-13 NOTE — Discharge Summary (Signed)
 Physician Discharge Summary   Patient: [name] MRN: [id number] DOB: Aug 26, 1966  Admit date:     01/31/2024  Discharge date: {dischdate:26783}  Discharge Physician: Modena Andes   PCP: [name], MD   Recommendations at discharge:  {Tip this will not be part of the note when signed- Example include specific recommendations for outpatient follow-up, pending tests to follow-up on. (Optional):26781}  ***  Discharge Diagnoses: Principal Problem:   Acute on chronic osteomyelitis of sacrum (HCC) Active Problems:   Chronic hypokalemia   Acute kidney injury superimposed on chronic kidney disease (HCC)   Acute anemia   Sepsis (HCC)   Metabolic acidosis   Chronic hypotension   Essential hypertension   History of spina bifida   Colostomy in place [facility])   History of urostomy   Sacral decubitus ulcer, stage IV (HCC)   Microcytic anemia   CKD (chronic kidney disease) stage 2, GFR 60-89 ml/min   Generalized anxiety disorder   DNR (do not resuscitate)  Resolved Problems:   Chronic indwelling Foley catheter  Hospital Course: No notes on file  Assessment and Plan: No notes have been filed under this hospital service. Service: Hospitalist     {Tip this will not be part of the note when signed Body mass index is 24.56 kg/m. ,  Nutrition Documentation    Flowsheet Row ED to Hosp-Admission (Current) from 01/31/2024 in [facility]  Nutrition Problem Increased nutrient needs  Etiology wound healing  Nutrition Goal Patient will meet greater than or equal to 90% of their needs  Interventions MVI, Juven     ,  Active Pressure Injury/Wound(s)     Pressure Ulcer  Duration          Pressure Injury 02/01/24 Buttocks Right;Left Unstageable - Full thickness tissue loss in which the base of the injury is covered by slough (yellow, tan, gray, green or brown) and/or eschar (tan, brown or black) in the wound bed. 12 days            (Optional):26781}  {(NOTE) Pain control PDMP Statment (Optional):26782} Consultants: *** Procedures performed: ***  Disposition: {Plan; Disposition:26390} Diet recommendation:  {Diet_Plan:26776} DISCHARGE MEDICATION: Allergies as of 02/13/2024       Reactions   Morphine And Codeine Nausea And Vomiting   Metrizamide Hives   Sulfa Antibiotics Other (See Comments)   Reaction: unknown   Ivp Dye [iodinated Contrast Media] Hives   Latex Rash        Medication List     STOP taking these medications    AZO CRANBERRY PO   CENTRUM MEN PO   dicyclomine  20 MG tablet Commonly known as: BENTYL    midodrine  10 MG tablet Commonly known as: PROAMATINE    pantoprazole  40 MG tablet Commonly known as: PROTONIX    potassium chloride  SA 20 MEQ tablet Commonly known as: KLOR-CON  M   QUEtiapine  25 MG tablet Commonly known as: SEROQUEL    rosuvastatin  10 MG tablet Commonly known as: CRESTOR    sucralfate  1 g tablet Commonly known as: CARAFATE    tamsulosin  0.4 MG Caps capsule Commonly known as: FLOMAX    tiZANidine 4 MG tablet Commonly known as: ZANAFLEX       TAKE these medications    lacosamide  150 mg in sodium chloride  0.9 % 25 mL Inject 150 mg into the vein every 12 (twelve) hours.   levETIRAcetam  500 MG/5ML undiluted injection Commonly known as: KEPPRA  Inject 10 mLs (1,000 mg total) into the vein every 12 (twelve) hours.  Follow-up Information     Sparks, Rosalynn Come, MD Follow up.   Specialty: Internal Medicine Contact information: [address] [place] [address] [phone]                Discharge Exam: [name] Weights   01/31/24 2146 02/02/24 0408  Weight: 46 kg 46.2 kg   ***  Condition at discharge: {DC Condition:26389}  The results of significant diagnostics from this hospitalization (including imaging, microbiology, ancillary and laboratory) are listed below for reference.   Imaging Studies: Overnight  EEG with video Result Date: 02/09/2024 Chase Lack, MD     02/09/2024  9:42 AM Patient Name: [name] MRN: [id number] Epilepsy Attending: Arleene [name] Referring Physician/Provider: [name], MD Duration: 02/08/2024 1454 to 02/09/2024 0945  Patient history: 57yo M with ams. EEG to evaluate for seizure  Level of alertness: Awake/ lethargic  AEDs during EEG study: LEV, LCM  Technical aspects: This EEG study was done with scalp electrodes positioned according to the 10-20 International system of electrode placement. Electrical activity was reviewed with band pass filter of 1-70Hz , sensitivity of 7 uV/mm, display speed of 24mm/sec with a 60Hz  notched filter applied as appropriate. EEG data were recorded continuously and digitally stored.  Video monitoring was available and reviewed as appropriate.  Description: EEG showed continuous generalized polymorphic high amplitude 5 to 6 Hz theta slowing admixed with intermittent 2-3hz  delta slowing. Hyperventilation and photic stimulation were not performed.    ABNORMALITY - Continuous slow, generalized  IMPRESSION: This study is suggestive of moderate diffuse encephalopathy. No further seizures were noted.  [name]   Overnight EEG with video Result Date: 02/07/2024 Chase Lack, MD     02/08/2024  8:58 AM Patient Name: [name] MRN: [id number] Epilepsy Attending: Arleene [name] Referring Physician/Provider: Khaliqdina, Salman, MD Duration: 02/06/2024 1505 to 02/07/2024 2005 Patient history: 58yo M with ams. EEG to evaluate for seizure  Level of alertness: Awake/ lethargic  AEDs during EEG study: LEV, LCM Technical aspects: This EEG study was done with scalp electrodes positioned according to the 10-20 International system of electrode placement. Electrical activity was reviewed with band pass filter of 1-70Hz , sensitivity of 7 uV/mm, display speed of 36mm/sec with a 60Hz  notched filter applied as appropriate. EEG data  were recorded continuously and digitally stored.  Video monitoring was available and reviewed as appropriate.  Description: EEG showed continuous generalized polymorphic high amplitude 5 to 6 Hz theta slowing admixed with intermittent 2-3hz  delta slowing. Hyperventilation and photic stimulation were not performed.   Four seizures were noted on 02/06/2024 at 2343, on 02/07/2024 at 040, 0520 and 0540. At the onset of seizure, no clinical signs were noted.  Subsequently patient had eye flutter followed by jerking in left upper extremity as well as facial twitching. This then progressed to bilateral upper extremity jerking. Concomitant EEG initially showed 4 to 5 Hz theta slowing in left frontotemporal region which then involved all of left hemisphere as well as right hemisphere and showed 3 to 5 Hz theta-delta slowing admixed with spike and wave at 2 to 3 Hz.  EEG then evolved into high amplitude sharply contoured 2 to 3 Hz delta slowing admixed with polyspikes in left hemisphere.  Duration of seizure was about 2-3 minutes. EEG was disconnected between 02/06/2024 1714 to 2215 for mri brain  ABNORMALITY - Focal seizure, left frontotemporal region - Continuous slow, generalized  IMPRESSION: This study showed four seizures as described  above arising from left frontotemporal region. Seizures were noted on /02/2024 at 2343, on 02/07/2024 at 040, 0520 and 0540, lasting about 2-3 minutes each. Additionally there is moderate diffuse encephalopathy.  [name]   MR BRAIN WO CONTRAST Result Date: 02/06/2024 CLINICAL DATA:  Mental status change, unknown cause. EXAM: MRI HEAD WITHOUT CONTRAST TECHNIQUE: Multiplanar, multiecho pulse sequences of the brain and surrounding structures were obtained without intravenous contrast. COMPARISON:  CT head May 3, 25. FINDINGS: Brain: Marked ventricular enlargement bilaterally with chronic thinning of the cerebral white matter and thinning of the corpus callosum. Small posterior fossa with  inferior cerebellar and brainstem and herniation up to proximally 1.1 cm. No evidence of acute infarct, acute hemorrhage, midline shift, or extra-axial fluid collection. Vascular: Major arterial flow voids are maintained at the skull base. Skull and upper cervical spine: Normal marrow signal. Sinuses/Orbits: Left maxillary sinus retention cyst. Otherwise, remaining sinuses are clear. Chronic changes of the left globe. IMPRESSION: Likely chronic hydrocephalus and Chiari II malformation, detailed above. No remote priors are available to assess for change. Electronically Signed   By: Stevenson Elbe M.D.   On: 02/06/2024 22:28   EEG adult Result Date: 02/06/2024 Chase Lack, MD     02/06/2024  2:04 PM Patient Name: Koleson Ata MRN: [id number] Epilepsy Attending: Arleene [name] Referring Physician/Provider: Brayton Calin, MD Date: 02/06/2024 Duration: 24.36 mins Patient history: 58yo M with ams. EEG to evaluate for seizure Level of alertness: Awake/ lethargic AEDs during EEG study: None Technical aspects: This EEG study was done with scalp electrodes positioned according to the 10-20 International system of electrode placement. Electrical activity was reviewed with band pass filter of 1-70Hz , sensitivity of 7 uV/mm, display speed of 12mm/sec with a 60Hz  notched filter applied as appropriate. EEG data were recorded continuously and digitally stored.  Video monitoring was available and reviewed as appropriate. Description: EEG showed continuous generalized polymorphic high amplitude 5 to 6 Hz theta slowing admixed with intermittent 2-3hz  delta slowing. Hyperventilation and photic stimulation were not performed.   ABNORMALITY - Continuous slow, generalized IMPRESSION: This study is suggestive of moderate diffuse encephalopathy. No seizures or epileptiform discharges were seen throughout the recording. [name]   CT HEAD WO CONTRAST ( ) Result Date: 02/04/2024 CLINICAL DATA:  Mental  status change.  Unknown cause. EXAM: CT HEAD WITHOUT CONTRAST TECHNIQUE: Contiguous axial images were obtained from the base of the skull through the vertex without intravenous contrast. RADIATION DOSE REDUCTION: This exam was performed according to the departmental dose-optimization program which includes automated exposure control, adjustment of the mA and/or kV according to patient size and/or use of iterative reconstruction technique. COMPARISON:  None Available. FINDINGS: Brain: The cerebellar tonsils herniate below the foramen magnum. Extend to at least the level of C1. The inferior extent is not visualized. The posterior fossa is small. Marked ventricular enlargement present bilaterally with chronic thinning of the cerebral white matter. Moderate enlargement of third ventricle is noted. Marked thinning of the cerebral white matter is present bilaterally. Acute cortical infarct or hemorrhage is present. No mass lesion is present. Corpus callosum is stretched over the enlarged ventricles somewhat thin. Vascular: No hyperdense vessel or unexpected calcification. Skull: A right paramedian frontal calvarium defect may be a remote burr hole. Scalp ventriculostomy tubing extends along the left frontoparietal scalp. No shunt is present. Sinuses/Orbits: Left middle ear and mastoid effusion is present. No obstructing nasopharyngeal lesion is present. The paranasal sinuses and mastoid air cells are otherwise clear. The  left globe is calcified. IMPRESSION: 1. No acute intracranial abnormality. 2. Marked ventricular enlargement bilaterally with chronic thinning of the cerebral white matter. This likely reflects chronic hydrocephalus. 3. The cerebellar tonsils herniate below the foramen magnum. Extend to at least the level of C1. The inferior extent is not visualized. Findings are consistent with a Chiari malformation. 4. Left middle ear and mastoid effusion. No obstructing nasopharyngeal lesion is present. 5. Calcified  left globe. Electronically Signed   By: Audree Leas M.D.   On: 02/04/2024 18:39   US  EKG SITE RITE Result Date: 02/03/2024 If Site Rite image not attached, placement could not be confirmed due to current cardiac rhythm.  CT ABDOMEN PELVIS WO CONTRAST Result Date: 01/31/2024 EXAM: CT ABDOMEN AND PELVIS WITHOUT CONTRAST 01/31/2024 11:19:26 PM TECHNIQUE: CT of the abdomen and pelvis was performed without the administration of intravenous contrast. Multiplanar reformatted images are provided for review. Automated exposure control, iterative reconstruction, and/or weight based adjustment of the mA/kV was utilized to reduce the radiation dose to as low as reasonably achievable. COMPARISON: CT pelvis dated 12/23/2023 and CT abdomen/pelvis dated 03/07/2021. CLINICAL HISTORY: Extensive sacral and buttock wounds, concern for possible deep infection. Chief complaints; Fatigue; Weakness; Wound Check. FINDINGS: LOWER CHEST: No acute abnormality. HEPATOBILIARY: The liver is unremarkable. Gallbladder is unremarkable. No biliary ductal dilatation. SPLEEN: No acute abnormality. PANCREAS: No acute abnormality. ADRENAL GLANDS: No acute abnormality. KIDNEYS, URETERS AND BLADDER: Fragmented staghorn right renal calculus measuring up to 18 mm in the right renal pelvis (image 27), chronic. Clustered left renal calculi measuring up to 13 mm (image 29), chronic. No evidence of hydronephrosis. No evidence of perinephric or periureteral stranding. Urinary bladder is unremarkable. GI AND BOWEL: Status post left hemicolectomy with left mid-abdominal colostomy. Stomach demonstrates no acute abnormality. There is no evidence of bowel obstruction. No evidence of appendicitis. PERITONEUM AND RETROPERITONEUM: No evidence of ascites. No free air. Aorta is normal in caliber. LYMPH NODES: No evidence of lymphadenopathy. REPRODUCTIVE ORGANS: No acute abnormality. BONES AND SOFT TISSUES: Deep left gluteal ulcer with acute on chronic  osteomyelitis involving the left inferior pubic ramus and ischium (image 58) and associated septic joint with fluid/gas in the left hip and progressive destruction of the left femoral head (image 57). Deep right gluteal ulcer with acute on chronic osteomyelitis involving the right inferior pubic ramus with new extension to the right greater trochanter (image 62). Chronic spinal dysraphism. IMPRESSION: 1. Bilateral deep gluteal ulcers with acute on chronic pelvic osteomyelitis and left hip septic joint, as described above, progressive. 2. Additional stable ancillary findings, as above. Electronically signed by: Zadie Herter MD 01/31/2024 11:40 PM EDT RP Workstation: [id number]    Microbiology: Results for orders placed or performed during the hospital encounter of 01/31/24  Blood culture (routine x 2)     Status: None   Collection Time: 02/01/24 12:04 AM   Specimen: BLOOD  Result Value Ref Range Status   Specimen Description BLOOD SITE NOT SPECIFIED  Final   Special Requests   Final    BOTTLES DRAWN AEROBIC AND ANAEROBIC Blood Culture results may not be optimal due to an inadequate volume of blood received in culture bottles   Culture   Final    NO GROWTH 5 DAYS Performed at [facility], [address] [place], [place] [address]    Report Status 02/06/2024 FINAL  Final  Culture, blood (Routine X 2) w Reflex to ID Panel     Status: None   Collection Time: 02/01/24  12:36 AM   Specimen: BLOOD  Result Value Ref Range Status   Specimen Description BLOOD SITE NOT SPECIFIED  Final   Special Requests   Final    BOTTLES DRAWN AEROBIC AND ANAEROBIC Blood Culture results may not be optimal due to an inadequate volume of blood received in culture bottles   Culture   Final    NO GROWTH 5 DAYS Performed at [facility], [address], [place], [place] [address]    Report Status 02/06/2024 FINAL  Final  MRSA Next Gen by PCR, Nasal     Status: None   Collection Time: 02/01/24  2:44 PM    Specimen: Nasal Mucosa; Nasal Swab  Result Value Ref Range Status   MRSA by PCR Next Gen NOT DETECTED NOT DETECTED Final    Comment: (NOTE) The GeneXpert MRSA Assay (FDA approved for NASAL specimens only), is one component of a comprehensive MRSA colonization surveillance program. It is not intended to diagnose MRSA infection nor to guide or monitor treatment for MRSA infections. Test performance is not FDA approved in patients less than 7 years old. Performed at [facility], [address] [place], [place] [address]   MRSA Next Gen by PCR, Nasal     Status: None   Collection Time: 02/07/24  6:38 AM   Specimen: Nasal Mucosa; Nasal Swab  Result Value Ref Range Status   MRSA by PCR Next Gen NOT DETECTED NOT DETECTED Final    Comment: (NOTE) The GeneXpert MRSA Assay (FDA approved for NASAL specimens only), is one component of a comprehensive MRSA colonization surveillance program. It is not intended to diagnose MRSA infection nor to guide or monitor treatment for MRSA infections. Test performance is not FDA approved in patients less than 67 years old. Performed at [facility], [address] [place], [place] [address]     Labs: CBC: Recent Labs  Lab 02/07/24 0532 02/07/24 0938 02/08/24 0500 02/09/24 0500 02/10/24 0459  WBC 7.0  --  8.1 8.3 8.4  HGB 10.0* 10.5* 10.6* 9.8* 11.3*  HCT 34.1* 31.0* 35.6* 32.7* 37.2*  MCV 86.3  --  84.0 86.1 84.5  PLT 433*  --  442* 364 377   Basic Metabolic Panel: Recent Labs  Lab 02/07/24 0532 02/07/24 0938 02/08/24 0500 02/08/24 0953 02/08/24 1821 02/09/24 0500 02/10/24 0459  NA 144 147*  --  148*  149* 142 140 138  K 3.6 3.6  --  2.8*  2.8* 4.0 4.0 3.6  CL 122*  --   --  114*  112* 110 109 103  CO2 16*  --   --  26  26 24 22 25   GLUCOSE 54*  --   --  88  89 78 77 89  BUN 13  --   --  7  8 5* 6 5*  CREATININE 1.38*  --   --  1.26*  1.50* 1.12 1.00 1.03  CALCIUM  8.1*  --   --  8.0*  8.0* 7.6* 7.7* 8.0*   MG 2.2  --  2.0  --   --  1.6* 2.2  PHOS 2.4*  --   --  1.9* 1.5* 1.4*  1.4* 2.8   Liver Function Tests: Recent Labs  Lab 02/07/24 0532 02/08/24 0953 02/08/24 1821 02/09/24 0500 02/10/24 0459  AST 15 16  --   --   --   ALT 10 10  --   --   --   ALKPHOS 61 71  --   --   --  BILITOT 0.6 0.5  --   --   --   PROT 5.3* 5.2*  --   --   --   ALBUMIN  2.6* 2.1*  2.1* 1.9* 1.8* 1.9*   CBG: Recent Labs  Lab 02/09/24 0426 02/09/24 1653 02/09/24 2346 02/10/24 0441 02/10/24 1216  GLUCAP 89 77 139* 96 141*    Discharge time spent: {LESS THAN/GREATER THAN:26388} 30 minutes.  Signed: Modena Andes, MD [facility] 02/13/2024

## 2024-02-13 NOTE — Progress Notes (Signed)
 PROGRESS NOTE    [name]  UUV:[id number] DOB: 1965-12-08 DOA: 01/31/2024 PCP: [name], MD   Brief Narrative:  58 year old M with PMH of spina bifid/neurogenic bladder with urostomy and colostomy, chronic sacral decubitus, chronic osteomyelitis of ischial tuberosity, inferior pubic ramus, HTN, anemia and recent hospitalization from 3/21-3/28 for septic shock in the setting of necrotizing soft tissue infection and sacral decubitus for which she was treated with vasopressor, broad-spectrum antibiotics and discharged on p.o. Augmentin  for 14 weeks per recommendation by ID.     Patient presents with generalized weakness.  Workup in ED concerning for acute on chronic osteomyelitis, acute on chronic anemia, AKI and hypokalemia.  He had mild leukocytosis to 14.4.  Hgb dropped from 8.3 (baseline) to 5.9 and 5.5 on repeat.  Cr 1.98 (was 1.0 on 3/27).  K2.2.  CT abdomen and pelvis concerning bilateral deep gluteal ulcers with acute on chronic pelvic osteomyelitis and progressive left hip septic joint.  Blood cultures obtained.  Started on broad-spectrum antibiotics and potassium supplementation.  2 units of blood ordered.  Orthopedic surgery consulted   The next day, transfused 2 units and hemoglobin improved to 11.5.  Evaluated by orthopedic surgery who did not feel there is need for surgical intervention.  ID initially recommended cefepime , oral Zyvox  and Flagyl  until 5/15 followed by Augmentin  for 4 weeks.   Hospital course complicated by effusion/delirium.  Cefepime  changed to ertapenem .  Flagyl  discontinued.  Ammonia, TSH and B12 within normal.  CT head and MRI brain with hydrocephalus and Chiari malformation features unchanged from prior CT in 2009.  Intermittent seizures since 5/5.  Now on Keppra  and Vimpat  and LTM EEG.   After discussion with patient's sisters, palliative consulted.  Goal status changed to DNR on 5/7.  Patient eventually transitioned to comfort care on the  afternoon of 02/10/2024.  Assessment & Plan:   Principal Problem:   Acute on chronic osteomyelitis of sacrum (HCC) Active Problems:   Chronic hypokalemia   Acute kidney injury superimposed on chronic kidney disease (HCC)   Acute anemia   Sepsis (HCC)   Metabolic acidosis   Chronic hypotension   Essential hypertension   History of spina bifida   Colostomy in place [facility])   History of urostomy   Sacral decubitus ulcer, stage IV (HCC)   Microcytic anemia   CKD (chronic kidney disease) stage 2, GFR 60-89 ml/min   Generalized anxiety disorder   DNR (do not resuscitate)  Sepsis due to acute on chronic osteomyelitis of the ischial tuberosity/inferior pubic ramus Left hip septic joint Spina bifida with sacral decubitus ulcer stage IV -Recently hospitalized for septic shock and discharged on Augmentin  for 14 weeks -CT-b/l deep gluteal ulcers with acute on chronic pelvic osteo and progressive left hip septic joint.  . -CRP 25.  ESR 140.  Blood cultures NGTD. -Per Ortho, no good surgical remedy -IR consulted for drainage of left hip but recommended treating based on positive culture on 3/21 -Ertapenem  and linezolid  until 5/15 followed by 4 weeks of Augmentin  per ID. -PICC line placed on 5/2.  GOC discussion as below.   Acute encephalopathy/delirium/seizure activity:  CT head and MRI brain with hydrocephalus and Chiari malformation which seems to be chronic.  Intermittent seizure since 5/5.  Captured on EEG the night of 5/5.  Another brief episode the morning of 5/7.  Status post LTM EEG, Keppra  and Vimpat  per neurology -Ativan  as needed for breakthrough seizure -Seizure precaution, n.p.o..  GOC disc patient as below.  Oropharyngeal dysphagia in the setting of encephalopathy -Changed essential meds to IV -Family not interested in tube feed risk and benefit discussion.  GOC patient as below.   Acute on chronic anemia: Hgb dropped from 8.3-5.5.  Likely dilutional from IV fluid  resuscitation.  No overt bleeding.  Hgb stable after 2 units.  GOC discussion as below   AKI: Resolved   Chronic hypotension: On midodrine  at home.  Cortisol within normal.  TSH slightly low but free T4 normal.  Normal TTE in 2017.  Hypotension seems to have resolved.  Flomax  discontinued.   Diminished hearing/earwax in right ear: Left mastoid/middle ear effusion on CT. -Antibiotics as above -Debrox 3 times daily   Prolonged QTc: QTc 599 in the setting of severe hypokalemia.  Improved to 470.   Hypokalemia/hypomagnesemia/hypophosphatemia:  - Resolved  Ventriculomegaly/Chiari malformation-chronic. History of spina bifida with a stage IV ulcer, neurogenic bladder, urostomy and colostomy -Continue wound and ostomy care -Discontinue Flomax .  Does not help with neurogenic bladder   Non-anion gap metabolic acidosis: Likely due to renal failure.  Resolved.   Advance care planning: Extensive discussion by previous hospitalist Dr. Mae Schlossman with patient's sisters on 5/6 and 5/7.  Now DNR-Limited.  Palliative care engaged.  They were following daily.  Eventually patient was transitioned to comfort care on the afternoon of 02/10/2024.  Palliative following and we appreciate their help.  Patient appears very comfortable.  DVT prophylaxis:    Code Status: Do not attempt resuscitation (DNR) - Comfort care  Family Communication: Nephew and 2 sisters present at bedside.   Status is: Inpatient Remains inpatient appropriate because: Anticipated hospital death  Estimated body mass index is 24.56 kg/m as calculated from the following:   Height as of this encounter: 4\' 6"  (1.372 m).   Weight as of this encounter: 46.2 kg.  Pressure Injury 02/01/24 Buttocks Right;Left Unstageable - Full thickness tissue loss in which the base of the injury is covered by slough (yellow, tan, gray, green or brown) and/or eschar (tan, brown or black) in the wound bed. (Active)  02/01/24 1148  Location: Buttocks  Location  Orientation: Right;Left  Staging: Unstageable - Full thickness tissue loss in which the base of the injury is covered by slough (yellow, tan, gray, green or brown) and/or eschar (tan, brown or black) in the wound bed.  Wound Description (Comments):   Present on Admission: Yes  Dressing Type Other (Comment) (patient comfort care) 02/13/24 0739   Nutritional Assessment: Body mass index is 24.56 kg/m.[name] Seen by dietician.  I agree with the assessment and plan as outlined below: Nutrition Status: Nutrition Problem: Increased nutrient needs Etiology: wound healing Signs/Symptoms: estimated needs Interventions: MVI, Juven  . Skin Assessment: I have examined the patient's skin and I agree with the wound assessment as performed by the wound care RN as outlined below: Pressure Injury 02/01/24 Buttocks Right;Left Unstageable - Full thickness tissue loss in which the base of the injury is covered by slough (yellow, tan, gray, green or brown) and/or eschar (tan, brown or black) in the wound bed. (Active)  02/01/24 1148  Location: Buttocks  Location Orientation: Right;Left  Staging: Unstageable - Full thickness tissue loss in which the base of the injury is covered by slough (yellow, tan, gray, green or brown) and/or eschar (tan, brown or black) in the wound bed.  Wound Description (Comments):   Present on Admission: Yes  Dressing Type Other (Comment) (patient comfort care) 02/13/24 0739    Consultants:  Orthopedics ID and palliative care  Procedures:  As above  Antimicrobials:  Anti-infectives (From admission, onward)    Start     Dose/Rate Route Frequency Ordered Stop   02/05/24 1045  linezolid  (ZYVOX ) IVPB 600 mg  Status:  Discontinued        600 mg 300 mL/hr over 60 Minutes Intravenous Every 12 hours 02/05/24 0945 02/10/24 1725   02/05/24 0045  linezolid  (ZYVOX ) IVPB 600 mg        600 mg 300 mL/hr over 60 Minutes Intravenous  Once 02/04/24 2349 02/05/24 0933   02/04/24 1700   ertapenem  (INVANZ ) 1 g in sodium chloride  0.9 % 100 mL IVPB  Status:  Discontinued        1 g 200 mL/hr over 30 Minutes Intravenous Daily 02/04/24 1600 02/11/24 0813   02/02/24 2200  ceFEPIme  (MAXIPIME ) 2 g in sodium chloride  0.9 % 100 mL IVPB  Status:  Discontinued        2 g 200 mL/hr over 30 Minutes Intravenous Every 12 hours 02/02/24 1600 02/04/24 1600   02/02/24 2200  metroNIDAZOLE  (FLAGYL ) tablet 500 mg  Status:  Discontinued        500 mg Oral Every 12 hours 02/02/24 1600 02/04/24 1600   02/02/24 2200  linezolid  (ZYVOX ) tablet 600 mg  Status:  Discontinued        600 mg Oral Every 12 hours 02/02/24 1600 02/05/24 0945   02/01/24 1815  DAPTOmycin  (CUBICIN ) 350 mg in sodium chloride  0.9 % IVPB  Status:  Discontinued        8 mg/kg  46 kg 114 mL/hr over 30 Minutes Intravenous Daily 02/01/24 1719 02/02/24 1600   02/01/24 0700  piperacillin -tazobactam (ZOSYN ) IVPB 3.375 g  Status:  Discontinued        3.375 g 12.5 mL/hr over 240 Minutes Intravenous Every 8 hours 02/01/24 0254 02/02/24 1600   02/01/24 0348  vancomycin  variable dose per unstable renal function (pharmacist dosing)  Status:  Discontinued         Does not apply See admin instructions 02/01/24 0348 02/01/24 1657   02/01/24 0015  vancomycin  (VANCOCIN ) IVPB 1000 mg/200 mL premix        1,000 mg 200 mL/hr over 60 Minutes Intravenous  Once 02/01/24 0010 02/01/24 0242   02/01/24 0015  piperacillin -tazobactam (ZOSYN ) IVPB 3.375 g        3.375 g 100 mL/hr over 30 Minutes Intravenous  Once 02/01/24 0010 02/01/24 0148         Subjective: Patient seen and examined.  Sister at the bedside.  Patient tells me that patient was slightly awake last night, but heard some words, squeezed her finger as well.  Patient is moving his eyes today, appears to be slightly better than how he was 2 days ago.  Discussed with the family about potentially discharging to [facility].  Sister appeared to be leaning towards it however she needs to discuss  with other family members.  I have also notified about my conversation and recommendation to transfer to [facility] to palliative care as well.  Objective: Vitals:   02/11/24 2036 02/12/24 1734 02/12/24 2016 02/13/24 0930  BP: 115/80 116/66 109/71 123/78  Pulse: (!) 105 (!) 116 (!) 117 (!) 113  Resp: (!) 25 16 20 20   Temp: 98.2 F (36.8 C) 98.6 F (37 C) 98.9 F (37.2 C) 97.8 F (36.6 C)  TempSrc: Axillary Axillary Axillary Axillary  SpO2: 91% 96% 98% 97%  Weight:      Height:  Intake/Output Summary (Last 24 hours) at 02/13/2024 1252 Last data filed at 02/13/2024 0600 Gross per 24 hour  Intake --  Output 450 ml  Net -450 ml    Filed Weights   01/31/24 2146 02/02/24 0408  Weight: 46 kg 46.2 kg    Examination:  General exam: Appears calm and comfortable  Respiratory system: Clear to auscultation. Respiratory effort normal. Cardiovascular system: S1 & S2 heard, RRR. No JVD, murmurs, rubs, gallops or clicks. No pedal edema. Gastrointestinal system: Abdomen is nondistended, soft and nontender. No organomegaly or masses felt. Normal bowel sounds heard.   Data Reviewed: I have personally reviewed following labs and imaging studies  CBC: Recent Labs  Lab 02/07/24 0532 02/07/24 0938 02/08/24 0500 02/09/24 0500 02/10/24 0459  WBC 7.0  --  8.1 8.3 8.4  HGB 10.0* 10.5* 10.6* 9.8* 11.3*  HCT 34.1* 31.0* 35.6* 32.7* 37.2*  MCV 86.3  --  84.0 86.1 84.5  PLT 433*  --  442* 364 377   Basic Metabolic Panel: Recent Labs  Lab 02/07/24 0532 02/07/24 0938 02/08/24 0500 02/08/24 0953 02/08/24 1821 02/09/24 0500 02/10/24 0459  NA 144 147*  --  148*  149* 142 140 138  K 3.6 3.6  --  2.8*  2.8* 4.0 4.0 3.6  CL 122*  --   --  114*  112* 110 109 103  CO2 16*  --   --  26  26 24 22 25   GLUCOSE 54*  --   --  88  89 78 77 89  BUN 13  --   --  7  8 5* 6 5*  CREATININE 1.38*  --   --  1.26*  1.50* 1.12 1.00 1.03  CALCIUM  8.1*  --   --  8.0*  8.0* 7.6* 7.7* 8.0*   MG 2.2  --  2.0  --   --  1.6* 2.2  PHOS 2.4*  --   --  1.9* 1.5* 1.4*  1.4* 2.8   GFR: Estimated Creatinine Clearance: 45.5 mL/min (by C-G formula based on SCr of 1.03 mg/dL). Liver Function Tests: Recent Labs  Lab 02/07/24 0532 02/08/24 0953 02/08/24 1821 02/09/24 0500 02/10/24 0459  AST 15 16  --   --   --   ALT 10 10  --   --   --   ALKPHOS 61 71  --   --   --   BILITOT 0.6 0.5  --   --   --   PROT 5.3* 5.2*  --   --   --   ALBUMIN  2.6* 2.1*  2.1* 1.9* 1.8* 1.9*   No results for input(s): "LIPASE", "AMYLASE" in the last 168 hours. No results for input(s): "AMMONIA" in the last 168 hours.  Coagulation Profile: No results for input(s): "INR", "PROTIME" in the last 168 hours. Cardiac Enzymes: No results for input(s): "CKTOTAL", "CKMB", "CKMBINDEX", "TROPONINI" in the last 168 hours. BNP (last 3 results) No results for input(s): "PROBNP" in the last 8760 hours. HbA1C: No results for input(s): "HGBA1C" in the last 72 hours. CBG: Recent Labs  Lab 02/09/24 0426 02/09/24 1653 02/09/24 2346 02/10/24 0441 02/10/24 1216  GLUCAP 89 77 139* 96 141*   Lipid Profile: No results for input(s): "CHOL", "HDL", "LDLCALC", "TRIG", "CHOLHDL", "LDLDIRECT" in the last 72 hours. Thyroid  Function Tests: No results for input(s): "TSH", "T4TOTAL", "FREET4", "T3FREE", "THYROIDAB" in the last 72 hours. Anemia Panel: No results for input(s): "VITAMINB12", "FOLATE", "FERRITIN", "TIBC", "IRON", "RETICCTPCT" in the last 72 hours.  Sepsis Labs: Recent Labs  Lab 02/07/24 0532  LATICACIDVEN 1.2    Recent Results (from the past 240 hours)  MRSA Next Gen by PCR, Nasal     Status: None   Collection Time: 02/07/24  6:38 AM   Specimen: Nasal Mucosa; Nasal Swab  Result Value Ref Range Status   MRSA by PCR Next Gen NOT DETECTED NOT DETECTED Final    Comment: (NOTE) The GeneXpert MRSA Assay (FDA approved for NASAL specimens only), is one component of a comprehensive MRSA colonization  surveillance program. It is not intended to diagnose MRSA infection nor to guide or monitor treatment for MRSA infections. Test performance is not FDA approved in patients less than 65 years old. Performed at [facility], [address] [place], [place] [address]      Radiology Studies: No results found.   Scheduled Meds:  Chlorhexidine  Gluconate Cloth  6 each Topical Daily   levETIRAcetam   1,000 mg Intravenous Q12H   sodium chloride  flush  3 mL Intravenous Q12H   Continuous Infusions:  lacosamide  (VIMPAT ) IV 150 mg (02/13/24 1023)     LOS: 12 days   Modena Andes, MD [facility]  02/13/2024, 12:52 PM   *Please note that this is a verbal dictation therefore any spelling or grammatical errors are due to the "Dragon Medical One" system interpretation.  Please page via Amion and do not message via secure chat for urgent patient care matters. Secure chat can be used for non urgent patient care matters.  How to contact the [facility] Attending or Consulting provider 7A - 7P or covering provider during after hours 7P -7A, for this patient?  Check the care team in [facility] and look for a) attending/consulting TR[facility] provider listed and b) the [facility] team listed. Page or secure chat 7A-7P. Log into [email or web address] and use [place]'s universal password to access. If you do not have the password, please contact the hospital operator. Locate the [facility] provider you are looking for under [facility] and page to a number that you can be directly reached. If you still have difficulty reaching the provider, please page the [facility] (Director on Call) for the Hospitalists listed on amion for assistance.

## 2024-02-13 NOTE — Progress Notes (Signed)
 Palliative Medicine Progress Note   Patient Name: [name]       Date: 02/13/2024 DOB: Aug 30, 1966  Age: 58 y.o. MRN#: [id number] Attending Physician: Modena Andes, MD Primary Care Physician: [name], MD Admit Date: 01/31/2024   HPI/Patient Profile: 58 y.o. male  with past medical history of spina bifida, neurogenic bladder with urostomy and colostomy, chronic sacral decubitus, chronic osteomyelitis of ischial tuberosity and inferior pubic ramus, anemia, and HTN.  He was recently hospitalized 3/21 - 3/28 for septic shock in the setting of necrotizing soft tissue infection and sacral decubitus.  He presented to the ED on 01/31/2024 with generalized weakness and was admitted with acute on chronic osteomyelitis, acute on chronic anemia, AKI, and hypokalemia.  Hospitalization complicated by confusion/delirium.   Palliative Medicine has been consulted for goals of care.  After discussion with family, patient was transition to comfort care on 5/9.  Subjective: Chart reviewed.  Update received from RN.  Patient assessed at bedside. He appears comfortable. Unresponsive to voice on my assessment, but family reports he occasionally awakens . No non-verbal signs of pain or discomfort noted. Respirations are even and unlabored. No excessive respiratory secretions noted.   Nephew/Joe at bedside.  Reviewed natural trajectory at end-of-life. Emotional support provided.    I spoke with sister/Sharon by phone.  Offered option to transfer to an inpatient hospice facility.  She states family's preference for facility in [place].  Objective:  Physical Exam Vitals reviewed.  Constitutional:      General: He is not in acute distress.    Appearance: He is ill-appearing.  Pulmonary:      Effort: Pulmonary effort is normal.  Skin:    General: Skin is warm and dry.  Neurological:     Mental Status: He is unresponsive.              Palliative Medicine Assessment & Plan   Assessment: Principal Problem:   Acute on chronic osteomyelitis of sacrum (HCC) Active Problems:   Chronic hypokalemia   Essential hypertension   Acute kidney injury superimposed on chronic kidney disease (HCC)   Acute anemia   Sepsis (HCC)   Metabolic acidosis   History of spina bifida   Colostomy in place [facility])   History of urostomy   Sacral decubitus ulcer, stage IV (HCC)   Microcytic  anemia   CKD (chronic kidney disease) stage 2, GFR 60-89 ml/min   Chronic hypotension   Generalized anxiety disorder   DNR (do not resuscitate)    Recommendations/Plan: Continue comfort care - no adjustments to medication required today PRN medications are available as per below for symptom management at end-of-life Referral to inpatient hospice - family request facility in [facility] PMT will continue to follow and support  Symptom Management:  Morphine prn for pain or dyspnea Lorazepam  (ATIVAN ) prn for anxiety Haloperidol (HALDOL) prn for agitation  Glycopyrrolate (ROBINUL) for excessive secretions Ondansetron  (ZOFRAN ) prn for nausea Polyvinyl alcohol (LIQUIFILM TEARS) prn for dry eyes Antiseptic oral rinse (BIOTENE) prn for dry mouth   Code Status: DNR - comfort   Prognosis:  < 2 weeks  Discharge Planning: Hospice facility  Care plan was discussed with ***  Thank you for allowing the Palliative Medicine Team to assist in the care of this patient.   ***   Wynetta Heckle, NP   Please contact Palliative Medicine Team phone at [phone] for questions and concerns.  For individual providers, please see AMION.

## 2024-02-13 NOTE — Plan of Care (Signed)
  Problem: Health Behavior/Discharge Planning: Goal: Ability to manage health-related needs will improve Outcome: Progressing   Problem: Clinical Measurements: Goal: Ability to maintain clinical measurements within normal limits will improve Outcome: Progressing Goal: Will remain free from infection Outcome: Progressing Goal: Diagnostic test results will improve Outcome: Progressing Goal: Respiratory complications will improve Outcome: Progressing Goal: Cardiovascular complication will be avoided Outcome: Progressing   Problem: Activity: Goal: Risk for activity intolerance will decrease Outcome: Progressing   Problem: Nutrition: Goal: Adequate nutrition will be maintained Outcome: Progressing   Problem: Coping: Goal: Level of anxiety will decrease Outcome: Progressing   Problem: Elimination: Goal: Will not experience complications related to bowel motility Outcome: Progressing Goal: Will not experience complications related to urinary retention Outcome: Progressing   Problem: Pain Managment: Goal: General experience of comfort will improve and/or be controlled Outcome: Progressing   Problem: Safety: Goal: Ability to remain free from injury will improve Outcome: Progressing   Problem: Skin Integrity: Goal: Risk for impaired skin integrity will decrease Outcome: Progressing   Problem: Fluid Volume: Goal: Hemodynamic stability will improve Outcome: Progressing   Problem: Clinical Measurements: Goal: Diagnostic test results will improve Outcome: Progressing Goal: Signs and symptoms of infection will decrease Outcome: Progressing   Problem: Respiratory: Goal: Ability to maintain adequate ventilation will improve Outcome: Progressing   Problem: Urinary Elimination: Goal: Signs and symptoms of infection will decrease Outcome: Progressing   Problem: Education: Goal: Knowledge of the prescribed therapeutic regimen will improve Outcome: Progressing   Problem:  Coping: Goal: Ability to identify and develop effective coping behavior will improve Outcome: Progressing   Problem: Clinical Measurements: Goal: Quality of life will improve Outcome: Progressing   Problem: Respiratory: Goal: Verbalizations of increased ease of respirations will increase Outcome: Progressing   Problem: Role Relationship: Goal: Family's ability to cope with current situation will improve Outcome: Progressing Goal: Ability to verbalize concerns, feelings, and thoughts to partner or family member will improve Outcome: Progressing   Problem: Pain Management: Goal: Satisfaction with pain management regimen will improve Outcome: Progressing

## 2024-02-14 DIAGNOSIS — M4628 Osteomyelitis of vertebra, sacral and sacrococcygeal region: Secondary | ICD-10-CM | POA: Diagnosis not present

## 2024-02-14 NOTE — Plan of Care (Signed)
   Problem: Coping: Goal: Level of anxiety will decrease Outcome: Progressing

## 2024-02-14 NOTE — Progress Notes (Signed)
 [facility] [phone] [facility] Liaison Note  Received request from [facility] manager for family interest in [facility] in [place]. Eligibility confirmed. Spoke with patient's sister to confirm interest and explain services. Family agreeable to transfer today. TOC aware. RN please call report to [phone] prior to patient leaving the unit. Please send signed DNR with patient at discharge.   Thank you for allowing us  to participate in this patient's care.  Ardine Beckwith, LPN [facility] Liaison [phone]

## 2024-02-14 NOTE — TOC Progression Note (Signed)
 Transition of Care [facility]) - Progression Note    Patient Details  Name: [name] MRN: [id number] Date of Birth: Dec 21, 1965  Transition of [facility]) CM/SW Contact  Carmon Christen, LCSWA Phone Number: 02/14/2024, 1:46 PM  Clinical Narrative:     [facility] informed CSW that patient has been approved for [facility] in [place] and they have a bed available today.They are working on consent paperwork now and will let CSW know when they are complete. CSW informed MD.       Expected Discharge Plan and Services         Expected Discharge Date: 02/14/24                                     Social Determinants of Health (SDOH) Interventions SDOH Screenings   Food Insecurity: No Food Insecurity (02/01/2024)  Housing: Low Risk  (02/01/2024)  Transportation Needs: No Transportation Needs (02/01/2024)  Utilities: Not At Risk (02/01/2024)  Depression (PHQ2-9): Low Risk  (12/09/2021)  Financial Resource Strain: Low Risk  (07/29/2023)   Received from [facility]  Tobacco Use: Low Risk  (02/01/2024)    Readmission Risk Interventions    02/03/2024    3:49 PM 08/10/2023    3:37 PM  Readmission Risk Prevention Plan  Transportation Screening Complete Complete  PCP or Specialist Appt within 3-5 Days Complete Complete  HRI or Home Care Consult Complete   Social Work Consult for Recovery Care Planning/Counseling Complete Complete  Palliative Care Screening Not Applicable Not Applicable  Medication Review Oceanographer) Referral to Pharmacy Complete

## 2024-02-14 NOTE — Progress Notes (Signed)
 Report given to [facility] at [facility].

## 2024-02-14 NOTE — Care Management Important Message (Signed)
 Important Message  Patient Details  Name: [name] MRN: [id number] Date of Birth: 1966-01-25   Important Message Given:  Yes - Medicare IM     Janith Melnick 02/14/2024, 11:32 AM

## 2024-02-14 NOTE — TOC Transition Note (Signed)
 Transition of Care [facility]) - Discharge Note   Patient Details  Name: [name] MRN: [id number] Date of Birth: [birth year]  [facility] Care [facility]) CM/SW Contact:  [name], LCSWA Phone Number: 02/14/2024, 3:26 PM   Clinical Narrative:     [facility] will [facility] to: [facility]  Anticipated DC date 02/14/2024  Family notified: Psychiatric nurse by: [name]  ?  Per MD patient ready for DC to [facility] . RN, patient, patient's family, and facility notified of DC. Discharge Summary sent to facility. RN given number for report  [phone] . DC packet on chart.DNR signed by MD attached to patients DC packet. Ambulance transport requested for patient.  CSW signing off.   Final next level of care: [facility] in [place]) Barriers to Discharge: No Barriers Identified   Patient Goals and CMS Choice     Choice offered to / list presented to : Sibling (sister [name])      Discharge Placement              Patient chooses bed at:  [facility] in [place]) Patient to be transferred to facility by: [facility] Name of family member notified: [name] Patient and family notified of of transfer: 02/14/24  Discharge Plan and Services Additional resources added to the After Visit Summary for                                       Social Drivers of Health (SDOH) Interventions SDOH Screenings   Food Insecurity: No Food Insecurity (02/01/2024)  Housing: Low Risk  (02/01/2024)  Transportation Needs: No Transportation Needs (02/01/2024)  Utilities: Not At Risk (02/01/2024)  Depression (PHQ2-9): Low Risk  (12/09/2021)  Financial Resource Strain: Low Risk  (07/29/2023)   Received from [facility]  Tobacco Use: Low Risk  (02/01/2024)     Readmission Risk Interventions    02/03/2024    3:49 PM 08/10/2023    3:37 PM  Readmission Risk Prevention Plan  Transportation Screening Complete Complete   PCP or Specialist Appt within 3-5 Days Complete Complete  HRI or Home Care Consult Complete   Social Work Consult for Recovery Care Planning/Counseling Complete Complete  Palliative Care Screening Not Applicable Not Applicable  Medication Review Oceanographer) Referral to Pharmacy Complete

## 2024-02-14 NOTE — Progress Notes (Signed)
 Palliative Medicine Progress Note   Patient Name: [name]       Date: 02/14/2024 DOB: 08/09/1966  Age: 58 y.o. MRN#: [id number] Attending Physician: Modena Andes, MD Primary Care Physician: [name], MD Admit Date: 01/31/2024   HPI/Patient Profile: 58 y.o. male  with past medical history of spina bifida, neurogenic bladder with urostomy and colostomy, chronic sacral decubitus, chronic osteomyelitis of ischial tuberosity and inferior pubic ramus, anemia, and HTN.  He was recently hospitalized 3/21 - 3/28 for septic shock in the setting of necrotizing soft tissue infection and sacral decubitus.  He presented to the ED on 01/31/2024 with generalized weakness and was admitted with acute on chronic osteomyelitis, acute on chronic anemia, AKI, and hypokalemia.  Hospitalization complicated by confusion/delirium.   Palliative Medicine has been consulted for goals of care.  After discussion with family, patient was transition to comfort care on 5/9.  Subjective: Chart reviewed.  Patient assessed at bedside.  He appears comfortable.  He opens his eyes to voice but does not track.  No nonverbal signs of pain or discomfort noted.  Respirations are even and unlabored.  No excessive respiratory secretions noted.  Objective:  Physical Exam Vitals reviewed.  Constitutional:      Appearance: He is ill-appearing.  Pulmonary:     Effort: Pulmonary effort is normal.  Neurological:     Mental Status: He is lethargic.     Comments: Minimally responsive             Palliative Medicine Assessment & Plan   Assessment: Principal Problem:   Acute on chronic osteomyelitis of sacrum (HCC) Active Problems:   Chronic hypokalemia   Essential hypertension   Acute kidney injury  superimposed on chronic kidney disease (HCC)   Acute anemia   Sepsis (HCC)   Metabolic acidosis   History of spina bifida   Colostomy in place [facility])   History of urostomy   Sacral decubitus ulcer, stage IV (HCC)   Microcytic anemia   CKD (chronic kidney disease) stage 2, GFR 60-89 ml/min   Chronic hypotension   Generalized anxiety disorder   DNR (do not resuscitate)    Recommendations/Plan: Continue comfort care - no adjustments to medication required today PRN medications are available as per below for symptom management at end-of-life Referral to inpatient hospice - family request  facility in [place] PMT will continue to follow and support   Symptom Management:  Morphine prn for pain or dyspnea Lorazepam  (ATIVAN ) prn for anxiety Haloperidol (HALDOL) prn for agitation  Glycopyrrolate (ROBINUL) for excessive secretions Ondansetron  (ZOFRAN ) prn for nausea Polyvinyl alcohol (LIQUIFILM TEARS) prn for dry eyes Antiseptic oral rinse (BIOTENE) prn for dry mouth   Code Status: DNR-comfort   Prognosis:  < 2 weeks  Discharge Planning: Hospice facility  Care plan was discussed with ***  Thank you for allowing the Palliative Medicine Team to assist in the care of this patient.   ***   Wynetta Heckle, NP   Please contact Palliative Medicine Team phone at [phone] for questions and concerns.  For individual providers, please see [facility].

## 2024-02-20 ENCOUNTER — Inpatient Hospital Stay: Payer: Self-pay | Admitting: Internal Medicine

## 2024-03-04 DEATH — deceased
